# Patient Record
Sex: Male | Born: 1935 | Race: White | Hispanic: No | Smoking: Former smoker
Health system: Southern US, Community
[De-identification: ages and names within clinical notes are randomized; demographics above are authoritative.]

## PROBLEM LIST (undated history)

## (undated) DIAGNOSIS — I499 Cardiac arrhythmia, unspecified: Secondary | ICD-10-CM

## (undated) DIAGNOSIS — H33059 Total retinal detachment, unspecified eye: Secondary | ICD-10-CM

## (undated) DIAGNOSIS — N135 Crossing vessel and stricture of ureter without hydronephrosis: Secondary | ICD-10-CM

## (undated) DIAGNOSIS — U071 COVID-19: Secondary | ICD-10-CM

## (undated) DIAGNOSIS — C801 Malignant (primary) neoplasm, unspecified: Secondary | ICD-10-CM

## (undated) DIAGNOSIS — I4891 Unspecified atrial fibrillation: Secondary | ICD-10-CM

## (undated) DIAGNOSIS — N139 Obstructive and reflux uropathy, unspecified: Secondary | ICD-10-CM

## (undated) DIAGNOSIS — Z932 Ileostomy status: Secondary | ICD-10-CM

## (undated) DIAGNOSIS — G4730 Sleep apnea, unspecified: Secondary | ICD-10-CM

## (undated) DIAGNOSIS — Z86718 Personal history of other venous thrombosis and embolism: Secondary | ICD-10-CM

## (undated) DIAGNOSIS — D649 Anemia, unspecified: Secondary | ICD-10-CM

## (undated) DIAGNOSIS — I639 Cerebral infarction, unspecified: Secondary | ICD-10-CM

## (undated) DIAGNOSIS — R0600 Dyspnea, unspecified: Secondary | ICD-10-CM

## (undated) DIAGNOSIS — N1330 Unspecified hydronephrosis: Secondary | ICD-10-CM

## (undated) DIAGNOSIS — K5190 Ulcerative colitis, unspecified, without complications: Secondary | ICD-10-CM

## (undated) DIAGNOSIS — M1990 Unspecified osteoarthritis, unspecified site: Secondary | ICD-10-CM

## (undated) DIAGNOSIS — N189 Chronic kidney disease, unspecified: Secondary | ICD-10-CM

## (undated) HISTORY — DX: Cerebral infarction, unspecified: I63.9

## (undated) HISTORY — PX: OTHER SURGICAL HISTORY: SHX169

## (undated) HISTORY — DX: Total retinal detachment, unspecified eye: H33.059

## (undated) HISTORY — DX: Anemia, unspecified: D64.9

## (undated) HISTORY — PX: TOTAL COLECTOMY: SHX852

## (undated) HISTORY — DX: Unspecified atrial fibrillation: I48.91

## (undated) HISTORY — PX: SMALL INTESTINE SURGERY: SHX150

## (undated) HISTORY — DX: Chronic kidney disease, unspecified: N18.9

## (undated) HISTORY — PX: COLON SURGERY: SHX602

## (undated) HISTORY — PX: BASAL CELL CARCINOMA EXCISION: SHX1214

## (undated) HISTORY — PX: EYE SURGERY: SHX253

---

## 2021-09-30 ENCOUNTER — Telehealth: Payer: Self-pay

## 2021-09-30 NOTE — Telephone Encounter (Addendum)
Daughter Debroah Baller called wanting Dr Yong Channel to see Joshua Riley sooner- Daughter was advised Dr Ronney Lion sch books out quickly.   Daughter wants [name] to be ref out to a kidney specialist.   Daughter was offered to switch providers should he need to be seen quicker, daughter declined. Only wants Dr Yong Channel.   I see Dr Yong Channel has availability Apil 4th, I will offer hat to the patient.

## 2021-09-30 NOTE — Telephone Encounter (Signed)
Per Tammy : Pt daughter states He is moving up her from [facility].  Currently on dialysis.   I scheduled him for a second appt.  But she says that he has a script and has gone to a dialysis facility here before.  I have told her to go ahead and reach out to line those appts up.  So she may call back and cancel. ?

## 2021-09-30 NOTE — Telephone Encounter (Incomplete Revision)
Daughter Debroah Baller called wanting Dr Yong Channel to see Percell Miller sooner- Daughter was advised Dr Ronney Lion sch books out quickly.  ? ?Daughter wants Kimm to be ref out to a kidney specialist.  ? ?Daughter was offered to switch providers should he need to be seen quicker, daughter declined. Only wants Dr Yong Channel.  ? ?I see Dr Yong Channel has availability Apil 4th, I will offer hat to the patient.  ?

## 2021-10-22 ENCOUNTER — Ambulatory Visit (INDEPENDENT_AMBULATORY_CARE_PROVIDER_SITE_OTHER): Payer: Medicare Other | Admitting: Family Medicine

## 2021-10-22 ENCOUNTER — Other Ambulatory Visit: Payer: Self-pay

## 2021-10-22 ENCOUNTER — Encounter: Payer: Self-pay | Admitting: Family Medicine

## 2021-10-22 VITALS — BP 104/64 | HR 55 | Temp 97.4°F | Ht 68.0 in | Wt 164.4 lb

## 2021-10-22 DIAGNOSIS — I951 Orthostatic hypotension: Secondary | ICD-10-CM | POA: Diagnosis not present

## 2021-10-22 DIAGNOSIS — T829XXA Unspecified complication of cardiac and vascular prosthetic device, implant and graft, initial encounter: Secondary | ICD-10-CM

## 2021-10-22 DIAGNOSIS — H409 Unspecified glaucoma: Secondary | ICD-10-CM | POA: Diagnosis not present

## 2021-10-22 DIAGNOSIS — N186 End stage renal disease: Secondary | ICD-10-CM | POA: Diagnosis not present

## 2021-10-22 DIAGNOSIS — Z9889 Other specified postprocedural states: Secondary | ICD-10-CM

## 2021-10-22 DIAGNOSIS — G609 Hereditary and idiopathic neuropathy, unspecified: Secondary | ICD-10-CM

## 2021-10-22 DIAGNOSIS — Z936 Other artificial openings of urinary tract status: Secondary | ICD-10-CM | POA: Insufficient documentation

## 2021-10-22 DIAGNOSIS — H9193 Unspecified hearing loss, bilateral: Secondary | ICD-10-CM

## 2021-10-22 DIAGNOSIS — Z436 Encounter for attention to other artificial openings of urinary tract: Secondary | ICD-10-CM

## 2021-10-22 DIAGNOSIS — Z933 Colostomy status: Secondary | ICD-10-CM

## 2021-10-22 HISTORY — DX: Hereditary and idiopathic neuropathy, unspecified: G60.9

## 2021-10-22 HISTORY — DX: Orthostatic hypotension: I95.1

## 2021-10-22 HISTORY — DX: End stage renal disease: N18.6

## 2021-10-22 MED ORDER — MIDODRINE HCL 5 MG PO TABS: 5.0000 mg | ORAL_TABLET | Freq: Three times a day (TID) | ORAL | 3 refills | Status: DC

## 2021-10-22 MED ORDER — MIDODRINE HCL 10 MG PO TABS: 10.0000 mg | ORAL_TABLET | Freq: Three times a day (TID) | ORAL | 3 refills | Status: DC

## 2021-10-22 MED ORDER — PREGABALIN 50 MG PO CAPS: 50.0000 mg | ORAL_CAPSULE | Freq: Every day | ORAL | 3 refills | Status: DC

## 2021-10-22 NOTE — Progress Notes (Signed)
?Phone: [phone] ?  ?Subjective:  ?Patient presents today to establish care.  Prior patient in [place] Dr. Jeronimo Norma.  ?Chief Complaint  ?Patient presents with  ? New Patient (Initial Visit)  ?  Recently moved from [facility] ?  ? end stage kidney disease  ?  Has been on dialysis for 1.5 yrs, is not on a transplant list  ?Needs referral to nephrologist - possibly [facility] so that he can stay with [facility]   ? low blood pressure  ?  Usually during and after treatments, but does bother him occasionally on his days off   ? post COVID  ?  COVID around a year ago, still having issues with SOB  ? ?See problem oriented charting ? ?The following were reviewed and entered/updated in epic: ?Past Medical History:  ?Diagnosis Date  ? Anemia   ? Chronic kidney disease   ? Neuromuscular disorder ([place])   ? ?Patient Active Problem List  ? Diagnosis Date Noted  ? ESRD (end stage renal disease) ([place]) 10/22/2021  ?  Priority: High  ? Orthostatic hypotension 10/22/2021  ?  Priority: High  ? ?Past Surgical History:  ?Procedure Laterality Date  ? COLON SURGERY    ? EYE SURGERY    ? SMALL INTESTINE SURGERY    ? ? ?History reviewed. No pertinent family history. ? ?Medications- reviewed and updated ?Current Outpatient Medications  ?Medication Sig Dispense Refill  ? latanoprost (XALATAN) 0.005 % ophthalmic solution 1 drop at bedtime.    ? sevelamer carbonate (RENVELA) 800 MG tablet Take 1,600 mg by mouth 3 (three) times daily with meals.    ? sodium bicarbonate 650 MG tablet Take 650 mg by mouth 2 (two) times daily.    ? timolol (TIMOPTIC) 0.5 % ophthalmic solution 1 drop 2 (two) times daily.    ? midodrine (PROAMATINE) 10 MG tablet Take 1 tablet (10 mg total) by mouth 3 (three) times daily. Takes 5 mg plus 89m for 15 mg total three times a day- started by nephrology in [place] 270 tablet 3  ? midodrine (PROAMATINE) 5 MG tablet Take 1 tablet (5 mg total) by mouth 3 (three) times daily with meals. Takes 5 mg plus 174mfor 15  mg total three times a day- [place]  ? pregabalin (LYRICA) 50 MG capsule Take 1 capsule (50 mg total) by mouth daily. 90 capsule 3  ? ?No current facility-administered medications for this visit.  ? ? ?Allergies-reviewed and updated ?Not on File ? ?Social History  ? ?Social History Narrative  ? Widowed- lost wife of 13 years to pancreatic cancer- previously married 4344ears. [place].   ? -will be living alone  ?   ? 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   ?   ? Hobbies: dinner with family- occasional martini  ? ? ?Objective  ?Objective:  ?BP 104/64   Pulse (!) 55   Temp (!) 97.4 ?F (36.3 ?C) (Temporal)   Ht 5' 8"  (1.727 m)   Wt 164 lb 6.4 oz (74.6 kg)   SpO2 97%   BMI 25.00 kg/m?  ?Gen: NAD, resting comfortably ?CV: RRR no murmurs rubs or gallops ?Lungs: CTAB no crackles, wheeze, rhonchi ?Abdomen: soft/nontender/nondistended/normal bowel sounds. No rebound or guarding. Colostomy bag noted. Urostomy tube noted ?Ext: no edema ?Skin: warm, dry ? ?  ?Assessment and Plan:  ? ?#ESRD ?#Hypotension ?S: Patient  is on dialysis MWF   ?Medication: Renvela 800 mg-takes 2 tablets 3 times a day as well as sodium bicarbonate 650 mg twice daily ?-Reports starting dialysis around 2021.  Not on transplant list. ?-Does have a tendency towards low blood pressures with dialysis days-takes midodrine 15 mg 3 times a day but still has issues with orthostatsis ?-Left femoral AV graft created 09/25/2021 (temporary on the right) with [facility] in [place] after femoral dialysis catheter stopped working December 2022 ?A/P: ESRD has been stable with MWF dialysis- urgent referral to get him seen locally with nephrology. Continue renvela and sodium bicarb through neprhology ?-also urgent referral to vascular surgery- recent AV graft in left femoral that he was told needed follow up- not  quite ready for use ?-For orthostatic hypotension I will refill his midodrine at 15 mg 3 times a day-started by nephrology but I will continue this-we may need input from nephrology if continues to have issues or if symptoms worsen ? ?# Idiopathic Neuropathy- started after covid though ?S: Medication: Lyrica 50 mg started in mid 2022. Mildly helpful. Also had seen chiropractor- mildly helpful.  ?- feet and legs and hands ?A/P: overall stable- continue current medicines . I refilled lyrida ? ?# history of ulcerative colitis led to complete colectomy in 2012- has colostomy bag (failed J patch) ?-also has urostomy as complication of colectomy which led to quicker dialysis per his report (sounds like had CKD IV prior to this). Still urinates from penis as well.   ? ?#Shortness of breath ?S: started after covid December 2021. Gets cramps when a lot of fluid pulled off but short of breath if pulls off less.  ?A/P: sounds like these issues are related to fluid status- will refer to nephrology for their expert opinion.   ?  ?#glaucoma- follows with optho- needs to establish locally- refer today ? ?#hearing loss- even with aids has issue-s requests referral ? ?#frustrations with chronic medial illness- offered therapy but he declines. Id worry about orthostatic symptoms with SSRI- he does not want to make changes at this time.  ? ?Recommended follow up: Return in about 3 months (around 01/21/2022). ?Future Appointments  ?Date Time Provider [place]  ?11/15/2021  3:20 PM Abbagayle Zaragoza, Brayton Mars, MD [facility]  ? ?Meds ordered this encounter  ?Medications  ? midodrine (PROAMATINE) 10 MG tablet  ?  Sig: Take 1 tablet (10 mg total) by mouth 3 (three) times daily. Takes 5 mg plus 70m for 15 mg total three times a day- [place] ?  Dispense:  270 tablet  ?  Refill:  3  ? midodrine (PROAMATINE) 5 MG tablet  ?  Sig: Take 1 tablet (5 mg total) by mouth 3 (three) times daily with meals. Takes 5 mg plus 175m for 15 mg total three times a day- [place]?  Dispense:  270 tablet  ?  Refill:  3  ? pregabalin (LYRICA) 50 MG capsule  ?  Sig: Take 1 capsule (50 mg total) by mouth daily.  ?  Dispense:  90 capsule  ?  Refill:  3  ? ?Return precautions advised. ?StGarret ReddishMD ?

## 2021-10-22 NOTE — Patient Instructions (Addendum)
Sign release of information at the check out desk for all immunizations as well as 2-3 years of labs, office visits from primary care. We are looking for pneumonia shot, tetanus shot, shingles shot and covid immunizations ?-also sign release for last 2 years from kidney doctor/nephrology ? ?We will call you within two weeks about your referral to kidney doctor and vascular surgeon and eye doctor and audiology and urology. If you do not hear within 2 weeks, give [place] a call.   ? ?Recommended follow up: Return in about 3 months (around 01/21/2022) for followup or sooner if needed.Schedule b4 you leave. ?-can cancel visit for may unless you have other concerns that come up ?

## 2021-11-15 ENCOUNTER — Ambulatory Visit: Payer: Self-pay | Admitting: Family Medicine

## 2021-11-17 ENCOUNTER — Emergency Department (HOSPITAL_BASED_OUTPATIENT_CLINIC_OR_DEPARTMENT_OTHER): Payer: Medicare Other

## 2021-11-17 ENCOUNTER — Encounter (HOSPITAL_BASED_OUTPATIENT_CLINIC_OR_DEPARTMENT_OTHER): Payer: Self-pay | Admitting: Emergency Medicine

## 2021-11-17 ENCOUNTER — Other Ambulatory Visit: Payer: Self-pay

## 2021-11-17 ENCOUNTER — Inpatient Hospital Stay (HOSPITAL_BASED_OUTPATIENT_CLINIC_OR_DEPARTMENT_OTHER)
Admission: EM | Admit: 2021-11-17 | Discharge: 2021-11-19 | DRG: 393 | Disposition: A | Discharge status: Home / Self Care | Payer: Medicare Other | Attending: Internal Medicine | Admitting: Internal Medicine

## 2021-11-17 DIAGNOSIS — Z932 Ileostomy status: Secondary | ICD-10-CM

## 2021-11-17 DIAGNOSIS — G609 Hereditary and idiopathic neuropathy, unspecified: Secondary | ICD-10-CM | POA: Diagnosis present

## 2021-11-17 DIAGNOSIS — Z602 Problems related to living alone: Secondary | ICD-10-CM | POA: Diagnosis present

## 2021-11-17 DIAGNOSIS — Z881 Allergy status to other antibiotic agents status: Secondary | ICD-10-CM

## 2021-11-17 DIAGNOSIS — K660 Peritoneal adhesions (postprocedural) (postinfection): Secondary | ICD-10-CM | POA: Diagnosis not present

## 2021-11-17 DIAGNOSIS — E875 Hyperkalemia: Secondary | ICD-10-CM | POA: Diagnosis not present

## 2021-11-17 DIAGNOSIS — H9193 Unspecified hearing loss, bilateral: Secondary | ICD-10-CM | POA: Diagnosis present

## 2021-11-17 DIAGNOSIS — I4819 Other persistent atrial fibrillation: Secondary | ICD-10-CM | POA: Diagnosis present

## 2021-11-17 DIAGNOSIS — Z974 Presence of external hearing-aid: Secondary | ICD-10-CM

## 2021-11-17 DIAGNOSIS — Z87891 Personal history of nicotine dependence: Secondary | ICD-10-CM

## 2021-11-17 DIAGNOSIS — I4891 Unspecified atrial fibrillation: Secondary | ICD-10-CM | POA: Diagnosis not present

## 2021-11-17 DIAGNOSIS — N1330 Unspecified hydronephrosis: Secondary | ICD-10-CM | POA: Diagnosis present

## 2021-11-17 DIAGNOSIS — Z933 Colostomy status: Secondary | ICD-10-CM

## 2021-11-17 DIAGNOSIS — I440 Atrioventricular block, first degree: Secondary | ICD-10-CM | POA: Diagnosis present

## 2021-11-17 DIAGNOSIS — N186 End stage renal disease: Secondary | ICD-10-CM | POA: Diagnosis present

## 2021-11-17 DIAGNOSIS — D631 Anemia in chronic kidney disease: Secondary | ICD-10-CM | POA: Diagnosis present

## 2021-11-17 DIAGNOSIS — M898X9 Other specified disorders of bone, unspecified site: Secondary | ICD-10-CM | POA: Diagnosis present

## 2021-11-17 DIAGNOSIS — Z992 Dependence on renal dialysis: Secondary | ICD-10-CM

## 2021-11-17 DIAGNOSIS — Z86718 Personal history of other venous thrombosis and embolism: Secondary | ICD-10-CM

## 2021-11-17 DIAGNOSIS — Z79899 Other long term (current) drug therapy: Secondary | ICD-10-CM

## 2021-11-17 DIAGNOSIS — H409 Unspecified glaucoma: Secondary | ICD-10-CM | POA: Diagnosis present

## 2021-11-17 DIAGNOSIS — I951 Orthostatic hypotension: Secondary | ICD-10-CM | POA: Diagnosis present

## 2021-11-17 DIAGNOSIS — Z9049 Acquired absence of other specified parts of digestive tract: Secondary | ICD-10-CM

## 2021-11-17 DIAGNOSIS — K56609 Unspecified intestinal obstruction, unspecified as to partial versus complete obstruction: Secondary | ICD-10-CM | POA: Diagnosis present

## 2021-11-17 DIAGNOSIS — I480 Paroxysmal atrial fibrillation: Secondary | ICD-10-CM | POA: Diagnosis present

## 2021-11-17 DIAGNOSIS — I4510 Unspecified right bundle-branch block: Secondary | ICD-10-CM | POA: Diagnosis present

## 2021-11-17 HISTORY — DX: Personal history of other venous thrombosis and embolism: Z86.718

## 2021-11-17 HISTORY — DX: Unspecified hydronephrosis: N13.30

## 2021-11-17 HISTORY — DX: Ulcerative colitis, unspecified, without complications: K51.90

## 2021-11-17 HISTORY — DX: Crossing vessel and stricture of ureter without hydronephrosis: N13.5

## 2021-11-17 LAB — COMPREHENSIVE METABOLIC PANEL
ALT: 31 U/L (ref 0–44)
AST: 29 U/L (ref 15–41)
Albumin: 5.2 g/dL — ABNORMAL HIGH (ref 3.5–5.0)
Alkaline Phosphatase: 86 U/L (ref 38–126)
Anion gap: 22 — ABNORMAL HIGH (ref 5–15)
BUN: 67 mg/dL — ABNORMAL HIGH (ref 8–23)
CO2: 30 mmol/L (ref 22–32)
Calcium: 11 mg/dL — ABNORMAL HIGH (ref 8.9–10.3)
Chloride: 90 mmol/L — ABNORMAL LOW (ref 98–111)
Creatinine, Ser: 11.05 mg/dL — ABNORMAL HIGH (ref 0.61–1.24)
GFR, Estimated: 4 mL/min — ABNORMAL LOW (ref >60)
Glucose, Bld: 160 mg/dL — ABNORMAL HIGH (ref 70–99)
Potassium: 6 mmol/L — ABNORMAL HIGH (ref 3.5–5.1)
Sodium: 142 mmol/L (ref 135–145)
Total Bilirubin: 0.8 mg/dL (ref 0.3–1.2)
Total Protein: 9.1 g/dL — ABNORMAL HIGH (ref 6.5–8.1)

## 2021-11-17 LAB — CBC
HCT: 32.9 % — ABNORMAL LOW (ref 39.0–52.0)
Hemoglobin: 11 g/dL — ABNORMAL LOW (ref 13.0–17.0)
MCH: 40.3 pg — ABNORMAL HIGH (ref 26.0–34.0)
MCHC: 33.4 g/dL (ref 30.0–36.0)
MCV: 120.5 fL — ABNORMAL HIGH (ref 80.0–100.0)
Platelets: 175 10*3/uL (ref 150–400)
RBC: 2.73 MIL/uL — ABNORMAL LOW (ref 4.22–5.81)
RDW: 14.9 % (ref 11.5–15.5)
WBC: 15.2 10*3/uL — ABNORMAL HIGH (ref 4.0–10.5)
nRBC: 0 % (ref 0.0–0.2)

## 2021-11-17 LAB — LIPASE, BLOOD: Lipase: 70 U/L — ABNORMAL HIGH (ref 11–51)

## 2021-11-17 LAB — LACTIC ACID, PLASMA: Lactic Acid, Venous: 2.8 mmol/L (ref 0.5–1.9)

## 2021-11-17 MED ORDER — ONDANSETRON HCL 4 MG/2ML IJ SOLN
4.0000 mg | Freq: Once | INTRAMUSCULAR | Status: AC
  Administered 2021-11-17: 4 mg via INTRAVENOUS
  Filled 2021-11-17: qty 2

## 2021-11-17 MED ORDER — SODIUM CHLORIDE 0.9 % IV BOLUS
250.0000 mL | Freq: Once | INTRAVENOUS | Status: AC
  Administered 2021-11-17: 250 mL via INTRAVENOUS

## 2021-11-17 MED ORDER — ONDANSETRON HCL 4 MG/2ML IJ SOLN
4.0000 mg | Freq: Once | INTRAMUSCULAR | Status: DC | PRN
  Filled 2021-11-17: qty 2

## 2021-11-17 MED ORDER — MORPHINE SULFATE (PF) 4 MG/ML IV SOLN
4.0000 mg | Freq: Once | INTRAVENOUS | Status: AC
  Administered 2021-11-17: 4 mg via INTRAVENOUS
  Filled 2021-11-17: qty 1

## 2021-11-17 NOTE — ED Notes (Signed)
Pt vomiting during triage. Significant about of brown vomit.  ?

## 2021-11-17 NOTE — ED Notes (Signed)
Oxygen drop to 85-86% on RA after receiving morphine // pt placed on 3L [place] and O2 100% ?

## 2021-11-17 NOTE — ED Notes (Addendum)
Cassandria Anger MD made aware of lactic 2.8 ?

## 2021-11-17 NOTE — ED Notes (Signed)
Dialysis M/W/F ?

## 2021-11-17 NOTE — ED Triage Notes (Signed)
Pt has colostomy and hasn't had to empty bag since approx 630 this morning. Pt states he had a large meal last night. Pt states he has had a blockage at stoma a few years ago and this feels the same.  ?

## 2021-11-18 ENCOUNTER — Other Ambulatory Visit: Payer: Self-pay | Admitting: Family Medicine

## 2021-11-18 ENCOUNTER — Emergency Department (HOSPITAL_BASED_OUTPATIENT_CLINIC_OR_DEPARTMENT_OTHER): Payer: Medicare Other

## 2021-11-18 ENCOUNTER — Encounter (HOSPITAL_COMMUNITY): Payer: Self-pay | Admitting: Internal Medicine

## 2021-11-18 DIAGNOSIS — Z9049 Acquired absence of other specified parts of digestive tract: Secondary | ICD-10-CM | POA: Diagnosis not present

## 2021-11-18 DIAGNOSIS — H409 Unspecified glaucoma: Secondary | ICD-10-CM

## 2021-11-18 DIAGNOSIS — Z992 Dependence on renal dialysis: Secondary | ICD-10-CM | POA: Diagnosis not present

## 2021-11-18 DIAGNOSIS — N186 End stage renal disease: Secondary | ICD-10-CM | POA: Diagnosis present

## 2021-11-18 DIAGNOSIS — I4819 Other persistent atrial fibrillation: Secondary | ICD-10-CM | POA: Diagnosis present

## 2021-11-18 DIAGNOSIS — Z881 Allergy status to other antibiotic agents status: Secondary | ICD-10-CM | POA: Diagnosis not present

## 2021-11-18 DIAGNOSIS — M898X9 Other specified disorders of bone, unspecified site: Secondary | ICD-10-CM | POA: Diagnosis present

## 2021-11-18 DIAGNOSIS — I951 Orthostatic hypotension: Secondary | ICD-10-CM | POA: Diagnosis present

## 2021-11-18 DIAGNOSIS — I4510 Unspecified right bundle-branch block: Secondary | ICD-10-CM | POA: Diagnosis present

## 2021-11-18 DIAGNOSIS — K56609 Unspecified intestinal obstruction, unspecified as to partial versus complete obstruction: Secondary | ICD-10-CM | POA: Diagnosis present

## 2021-11-18 DIAGNOSIS — D631 Anemia in chronic kidney disease: Secondary | ICD-10-CM | POA: Diagnosis present

## 2021-11-18 DIAGNOSIS — Z86718 Personal history of other venous thrombosis and embolism: Secondary | ICD-10-CM | POA: Diagnosis not present

## 2021-11-18 DIAGNOSIS — E875 Hyperkalemia: Secondary | ICD-10-CM | POA: Diagnosis present

## 2021-11-18 DIAGNOSIS — G609 Hereditary and idiopathic neuropathy, unspecified: Secondary | ICD-10-CM | POA: Diagnosis present

## 2021-11-18 DIAGNOSIS — I4891 Unspecified atrial fibrillation: Secondary | ICD-10-CM | POA: Diagnosis present

## 2021-11-18 DIAGNOSIS — I440 Atrioventricular block, first degree: Secondary | ICD-10-CM | POA: Diagnosis present

## 2021-11-18 DIAGNOSIS — K660 Peritoneal adhesions (postprocedural) (postinfection): Secondary | ICD-10-CM | POA: Diagnosis present

## 2021-11-18 DIAGNOSIS — Z602 Problems related to living alone: Secondary | ICD-10-CM | POA: Diagnosis present

## 2021-11-18 DIAGNOSIS — Z932 Ileostomy status: Secondary | ICD-10-CM | POA: Diagnosis not present

## 2021-11-18 DIAGNOSIS — Z79899 Other long term (current) drug therapy: Secondary | ICD-10-CM | POA: Diagnosis not present

## 2021-11-18 DIAGNOSIS — H9193 Unspecified hearing loss, bilateral: Secondary | ICD-10-CM | POA: Diagnosis present

## 2021-11-18 DIAGNOSIS — N1330 Unspecified hydronephrosis: Secondary | ICD-10-CM | POA: Diagnosis present

## 2021-11-18 DIAGNOSIS — Z87891 Personal history of nicotine dependence: Secondary | ICD-10-CM | POA: Diagnosis not present

## 2021-11-18 DIAGNOSIS — I480 Paroxysmal atrial fibrillation: Secondary | ICD-10-CM | POA: Diagnosis present

## 2021-11-18 DIAGNOSIS — G250 Essential tremor: Secondary | ICD-10-CM

## 2021-11-18 DIAGNOSIS — Z974 Presence of external hearing-aid: Secondary | ICD-10-CM | POA: Diagnosis not present

## 2021-11-18 HISTORY — DX: Unspecified glaucoma: H40.9

## 2021-11-18 LAB — BASIC METABOLIC PANEL
Anion gap: 21 — ABNORMAL HIGH (ref 5–15)
Anion gap: 17 — ABNORMAL HIGH (ref 5–15)
BUN: 69 mg/dL — ABNORMAL HIGH (ref 8–23)
BUN: 44 mg/dL — ABNORMAL HIGH (ref 8–23)
CO2: 30 mmol/L (ref 22–32)
CO2: 30 mmol/L (ref 22–32)
Calcium: 10 mg/dL (ref 8.9–10.3)
Calcium: 9.1 mg/dL (ref 8.9–10.3)
Chloride: 90 mmol/L — ABNORMAL LOW (ref 98–111)
Chloride: 92 mmol/L — ABNORMAL LOW (ref 98–111)
Creatinine, Ser: 11.29 mg/dL — ABNORMAL HIGH (ref 0.61–1.24)
Creatinine, Ser: 8.65 mg/dL — ABNORMAL HIGH (ref 0.61–1.24)
GFR, Estimated: 4 mL/min — ABNORMAL LOW (ref >60)
GFR, Estimated: 6 mL/min — ABNORMAL LOW (ref >60)
Glucose, Bld: 119 mg/dL — ABNORMAL HIGH (ref 70–99)
Glucose, Bld: 121 mg/dL — ABNORMAL HIGH (ref 70–99)
Potassium: 6.7 mmol/L (ref 3.5–5.1)
Potassium: 6 mmol/L — ABNORMAL HIGH (ref 3.5–5.1)
Sodium: 141 mmol/L (ref 135–145)
Sodium: 139 mmol/L (ref 135–145)

## 2021-11-18 LAB — LACTIC ACID, PLASMA
Lactic Acid, Venous: 2.3 mmol/L (ref 0.5–1.9)
Lactic Acid, Venous: 2.7 mmol/L (ref 0.5–1.9)

## 2021-11-18 LAB — CBG MONITORING, ED
Glucose-Capillary: 114 mg/dL — ABNORMAL HIGH (ref 70–99)
Glucose-Capillary: 177 mg/dL — ABNORMAL HIGH (ref 70–99)

## 2021-11-18 LAB — HEPATITIS B SURFACE ANTIBODY,QUALITATIVE: Hep B S Ab: NONREACTIVE

## 2021-11-18 LAB — HEPATITIS B SURFACE ANTIGEN: Hepatitis B Surface Ag: NONREACTIVE

## 2021-11-18 MED ORDER — SODIUM BICARBONATE 650 MG PO TABS
650.0000 mg | ORAL_TABLET | Freq: Two times a day (BID) | ORAL | Status: DC
  Administered 2021-11-18 – 2021-11-19 (×3): 650 mg
  Filled 2021-11-18 (×3): qty 1

## 2021-11-18 MED ORDER — ALBUMIN HUMAN 25 % IV SOLN
INTRAVENOUS | Status: AC
Start: 2021-11-18 — End: 2021-11-18
  Filled 2021-11-18: qty 100

## 2021-11-18 MED ORDER — ALBUMIN HUMAN 25 % IV SOLN
12.5000 g | Freq: Once | INTRAVENOUS | Status: AC
  Administered 2021-11-18: 12.5 g via INTRAVENOUS

## 2021-11-18 MED ORDER — HEPARIN SODIUM (PORCINE) 1000 UNIT/ML DIALYSIS
1500.0000 [IU] | INTRAMUSCULAR | Status: DC | PRN
  Filled 2021-11-18: qty 2

## 2021-11-18 MED ORDER — ONDANSETRON HCL 4 MG/2ML IJ SOLN: 4.0000 mg | Freq: Four times a day (QID) | INTRAMUSCULAR | Status: DC | PRN

## 2021-11-18 MED ORDER — ONDANSETRON 4 MG PO TBDP: 4.0000 mg | ORAL_TABLET | Freq: Four times a day (QID) | ORAL | Status: DC | PRN

## 2021-11-18 MED ORDER — CHLORHEXIDINE GLUCONATE CLOTH 2 % EX PADS
6.0000 | MEDICATED_PAD | Freq: Every day | CUTANEOUS | Status: DC
  Administered 2021-11-19: 6 via TOPICAL

## 2021-11-18 MED ORDER — SODIUM BICARBONATE 8.4 % IV SOLN
50.0000 meq | Freq: Once | INTRAVENOUS | Status: AC
Start: 2021-11-18 — End: 2021-11-18
  Administered 2021-11-18: 50 meq via INTRAVENOUS
  Filled 2021-11-18: qty 50

## 2021-11-18 MED ORDER — DIATRIZOATE MEGLUMINE & SODIUM 66-10 % PO SOLN
90.0000 mL | Freq: Once | ORAL | Status: AC
  Administered 2021-11-18: 90 mL via NASOGASTRIC
  Filled 2021-11-18: qty 90

## 2021-11-18 MED ORDER — FENTANYL CITRATE PF 50 MCG/ML IJ SOSY: 25.0000 ug | PREFILLED_SYRINGE | INTRAMUSCULAR | Status: DC | PRN

## 2021-11-18 MED ORDER — ACETAMINOPHEN 650 MG RE SUPP: 650.0000 mg | Freq: Four times a day (QID) | RECTAL | Status: DC | PRN

## 2021-11-18 MED ORDER — ACETAMINOPHEN 325 MG PO TABS
650.0000 mg | ORAL_TABLET | Freq: Four times a day (QID) | ORAL | Status: DC | PRN
  Administered 2021-11-18: 650 mg
  Filled 2021-11-18: qty 2

## 2021-11-18 MED ORDER — PREGABALIN 25 MG PO CAPS
50.0000 mg | ORAL_CAPSULE | Freq: Every day | ORAL | Status: DC
  Administered 2021-11-18: 50 mg
  Filled 2021-11-18: qty 2

## 2021-11-18 MED ORDER — LATANOPROST 0.005 % OP SOLN
1.0000 [drp] | Freq: Every day | OPHTHALMIC | Status: DC
  Administered 2021-11-18: 1 [drp] via OPHTHALMIC
  Filled 2021-11-18: qty 2.5

## 2021-11-18 MED ORDER — MIDODRINE HCL 5 MG PO TABS: 5.0000 mg | ORAL_TABLET | Freq: Three times a day (TID) | ORAL | Status: DC

## 2021-11-18 MED ORDER — HYDROXYZINE HCL 25 MG PO TABS: 25.0000 mg | ORAL_TABLET | Freq: Three times a day (TID) | ORAL | Status: DC | PRN

## 2021-11-18 MED ORDER — HEPARIN SODIUM (PORCINE) 1000 UNIT/ML IJ SOLN
5100.0000 [IU] | Freq: Once | INTRAMUSCULAR | Status: AC
  Administered 2021-11-18: 5100 [IU] via INTRAVENOUS
  Filled 2021-11-18: qty 5.1

## 2021-11-18 MED ORDER — TIMOLOL MALEATE 0.5 % OP SOLN
1.0000 [drp] | Freq: Two times a day (BID) | OPHTHALMIC | Status: DC
  Administered 2021-11-18: 1 [drp] via OPHTHALMIC
  Filled 2021-11-18 (×2): qty 5

## 2021-11-18 MED ORDER — DEXTROSE 50 % IV SOLN
1.0000 | Freq: Once | INTRAVENOUS | Status: AC
  Administered 2021-11-18: 50 mL via INTRAVENOUS
  Filled 2021-11-18: qty 50

## 2021-11-18 MED ORDER — INSULIN ASPART 100 UNIT/ML IV SOLN
5.0000 [IU] | Freq: Once | INTRAVENOUS | Status: AC
  Administered 2021-11-18: 5 [IU] via INTRAVENOUS

## 2021-11-18 MED ORDER — HYDRALAZINE HCL 20 MG/ML IJ SOLN: 10.0000 mg | INTRAMUSCULAR | Status: DC | PRN

## 2021-11-18 MED ORDER — MIDODRINE HCL 5 MG PO TABS
15.0000 mg | ORAL_TABLET | Freq: Three times a day (TID) | ORAL | Status: DC
  Administered 2021-11-18 – 2021-11-19 (×3): 15 mg
  Filled 2021-11-18 (×3): qty 3

## 2021-11-18 MED ORDER — ONDANSETRON HCL 4 MG PO TABS: 4.0000 mg | ORAL_TABLET | Freq: Four times a day (QID) | ORAL | Status: DC | PRN

## 2021-11-18 MED ORDER — ZOLPIDEM TARTRATE 5 MG PO TABS: 5.0000 mg | ORAL_TABLET | Freq: Every evening | ORAL | Status: DC | PRN

## 2021-11-18 MED ORDER — CAMPHOR-MENTHOL 0.5-0.5 % EX LOTN: 1.0000 "application " | TOPICAL_LOTION | Freq: Three times a day (TID) | CUTANEOUS | Status: DC | PRN

## 2021-11-18 MED ORDER — SEVELAMER CARBONATE 800 MG PO TABS: 1600.0000 mg | ORAL_TABLET | Freq: Three times a day (TID) | ORAL | Status: DC

## 2021-11-18 MED ORDER — MIDODRINE HCL 5 MG PO TABS: 15.0000 mg | ORAL_TABLET | Freq: Three times a day (TID) | ORAL | Status: DC

## 2021-11-18 NOTE — ED Notes (Signed)
Report given given to [facility]  ?

## 2021-11-18 NOTE — Procedures (Signed)
? ?  I was present at this dialysis session, have reviewed the session itself and made  appropriate changes ?Kelly Splinter MD ?[employer] ?pager [phone]   ?11/18/2021, 10:06 AM ? ? ?

## 2021-11-18 NOTE — Progress Notes (Signed)
[facility] informed of Lactic acid 2.7 ?

## 2021-11-18 NOTE — H&P (Signed)
?History and Physical  ? ? ?Patient: Joshua Riley CXK:[id number] DOB: 02-Jan-1936 ?DOA: 11/17/2021 ?DOS: the patient was seen and examined on 11/18/2021 ?PCP: Marin Olp, MD  ?Patient coming from: Home - lives with alone; NOK: Daughter, Pat Patrick, [phone] ? ? ?Chief Complaint: Abdominal pain ? ?HPI: Joshua Riley is a 86 y.o. male with medical history significant of ESRD on HD; s/p urostomy/colostomy; and neuropathy presenting with abdominal pain.  He reports prior h/o colostomy - "they thought it was going to be cancer".  He developed acute onset of abdominal pain at 0600 yesterday.  +nausea/vomiting.  No prior h/o SBO.  He does have h/o ESRD and has been on HD for 1-2 years.  He was in HD at the time of my evaluation. ? ? ? ?ER Course:  [facility] transfer, per Dr. Bridgett Larsson: ? ?86 year old from [place] with ESRD on HD MWF, history of colectomy.  Admission for high-grade SBO secondary to potential internal hernia seen on CT.  WBC 15.2, lactate 2.0, potassium 6.0 without EKG changes.  NG tube placed and patient was given a 250 cc fluid bolus.  General surgery Dr. Grandville Silos consulted.  ED physician will also consult nephrology regarding dialysis and hyperkalemia.  ? ?Update: Repeat potassium 6.7 and patient was given insulin/glucose and bicarb.  Needs dialysis and ED physician messaged Dr. Moshe Cipro. ? ? ? ? ?Review of Systems: As mentioned in the history of present illness. All other systems reviewed and are negative. ?Past Medical History:  ?Diagnosis Date  ? Anemia   ? Chronic kidney disease   ? ESRD (end stage renal disease) ([place]) 10/22/2021  ? Glaucoma 11/18/2021  ? History of DVT (deep vein thrombosis)   ? Hydronephrosis   ? managed wtih a PCN  ? Idiopathic neuropathy 10/22/2021  ? lyrica   ? Orthostatic hypotension 10/22/2021  ? Ulcerative colitis ([place])   ? Ureteral stricture   ? secondary to injury during surgery  ? ?Past Surgical History:  ?Procedure Laterality Date  ? COLON SURGERY    ?  creation of j pouch    ? and subsequent takedown of j pouch  ? EYE SURGERY    ? SMALL INTESTINE SURGERY    ? TOTAL COLECTOMY    ? ?Social History:  reports that he has quit smoking. His smoking use included cigarettes. He has never used smokeless tobacco. He reports current alcohol use of about 5.0 standard drinks per week. He reports that he does not use drugs. ? ?Allergies  ?Allergen Reactions  ? Cephalosporins Rash  ? Ciprofloxacin Itching and Rash  ? Baclofen Other (See Comments)  ?  Altered mental status, after accidental overdose ?Altered mental status, after accidental overdose ?  ? ? ?No family history on file. ? ?Prior to Admission medications   ?Medication Sig Start Date End Date Taking? Authorizing Provider  ?latanoprost (XALATAN) 0.005 % ophthalmic solution 1 drop at bedtime.    [provider]  ?midodrine (PROAMATINE) 10 MG tablet Take 1 tablet (10 mg total) by mouth 3 (three) times daily. Takes 5 mg plus 56m for 15 mg total three times a day- [facility]   HMarin Olp MD  ?midodrine (PROAMATINE) 5 MG tablet Take 1 tablet (5 mg total) by mouth 3 (three) times daily with meals. Takes 5 mg plus 186mfor 15 mg total three times a day- [facility]   HuMarin OlpMD  ?pregabalin (LYRICA) 50 MG capsule Take  1 capsule (50 mg total) by mouth daily. 10/22/21   Marin Olp, MD  ?sevelamer carbonate (RENVELA) 800 MG tablet Take 1,600 mg by mouth 3 (three) times daily with meals.    [provider]  ?sodium bicarbonate 650 MG tablet Take 650 mg by mouth 2 (two) times daily.    [provider]  ?timolol (TIMOPTIC) 0.5 % ophthalmic solution 1 drop 2 (two) times daily.    [provider]  ? ? ?Physical Exam: ?Vitals:  ? 11/18/21 1536 11/18/21 1617 11/18/21 1639 11/18/21 1811  ?BP: (!) 80/45 (!) 70/48 (!) 79/54 (!) 80/49  ?Pulse: 91 74 74 69  ?Resp:  18  20  ?Temp:  98.4 ?F (36.9 ?C)  98.2 ?F (36.8 ?C)   ?TempSrc:  Oral  Oral  ?SpO2:  92%  92%  ?Weight:      ?Height:      ? ?General:  Appears calm and comfortable and is in NAD; NG tube in  place and draining 400 cc bilious fluid ?Eyes:   EOMI, normal lids, iris ?ENT:  hard of hearing with hearing aids in place, normal lips & tongue, mmm; NG tube in place ?Neck:  no LAD, masses or thyromegaly ?Cardiovascular:  RRR, no m/r/g. No LE edema.  Femoral HD cath in R groin ?Respiratory:   CTA bilaterally with no wheezes/rales/rhonchi.  Normal respiratory effort. ?Abdomen:  soft, NT, ND, hypoactive BS diffusely, ostomy in RLQ ?Skin:  no rash or induration seen on limited exam ?Musculoskeletal:  grossly normal tone BUE/BLE, good ROM, no bony abnormality ?Psychiatric:  grossly normal mood and affect, speech fluent and appropriate, AOx3 ?Neurologic:  CN 2-12 grossly intact, moves all extremities in coordinated fashion ? ? ?Radiological Exams on Admission: ?Independently reviewed - see discussion in A/P where applicable ? ?CT ABDOMEN PELVIS WO CONTRAST ? ?Result Date: 11/18/2021 ?CLINICAL DATA:  Nausea and vomiting with abdominal pain, initial encounter EXAM: CT ABDOMEN AND PELVIS WITHOUT CONTRAST TECHNIQUE: Multidetector CT imaging of the abdomen and pelvis was performed following the standard protocol without IV contrast. RADIATION DOSE REDUCTION: This exam was performed according to the departmental dose-optimization program which includes automated exposure control, adjustment of the mA and/or kV according to patient size and/or use of iterative reconstruction technique. COMPARISON:  None Available. FINDINGS: Lower chest: Mild atelectatic changes are noted in the left base. Hepatobiliary: Liver is within normal limits. Gallbladder is well distended with a single dependent gallstone. No biliary ductal dilatation is seen. Pancreas: Unremarkable. No pancreatic ductal dilatation or surrounding inflammatory changes. Spleen: Normal in size without focal abnormality.  Adrenals/Urinary Tract: Adrenal glands are within normal limits. Kidneys are somewhat atrophied with evidence of left-sided percutaneous nephrostomy catheter. Scattered small cysts are noted bilaterally measuring less than 1 cm. No follow-up is recommended. No renal calculi or obstructive changes are seen. Bladder is decompressed. Stomach/Bowel: The colon has been surgically removed. A right-sided ileostomy is noted. The ileum just proximal to ileostomy appears within normal limits although in the mid abdomen and anterior pelvis there are multiple dilated loops of jejunum with significant fecalization of small bowel contents in the distal jejunum. There are changes suggestive of internal hernia causing the obstructive change best noted on the coronal image number 63 of series 5. Herniation of a small bowel loop into the ostomy defect just below the ostomy loop is noted. The distal ileum appears within normal limits. Vascular/Lymphatic: Aortic atherosclerosis. No enlarged abdominal or pelvic lymph nodes. Right femoral dialysis catheter is seen. This extends  to the level of the renal veins in the IVC. Dialysis loop is noted in the left thigh which may be occluded. Correlate with clinical findings. Reproductive: Prostate is within normal limits. A penile prosthesis reservoir is noted in the right lower quadrant. Other: No abdominal wall hernia or abnormality. No abdominopelvic ascites. Musculoskeletal: Degenerative changes of the lumbar spine are noted. IMPRESSION: Changes consistent with high-grade small bowel obstruction in the distal jejunum with diffuse fecalization of small bowel contents and jejunal dilation. Findings suggestive of internal hernia noted although these changes may be related to significant adhesions. The ileum and ileostomy appear within normal limits although herniation of a loop of ileum adjacent to the ostomy is noted without complicating factors. Surgical consultation is recommended. Left  nephrostomy catheter in satisfactory position. Cholelithiasis without complicating factors. Prior colectomy. Changes consistent with the known history of end-stage renal disease. Electronically Signed   By: Linus Mako.D.

## 2021-11-18 NOTE — Consult Note (Signed)
? ? ? ?Joshua Riley ?1935-07-17  ?[id number].   ? ?Requesting MD: Dr. Karmen Bongo ?Chief Complaint/Reason for Consult: SBO ? ?HPI:  ?This is an 86 yo male who is a poor historian with multiple medical problems including, ESRD on HD (MWF), hypotension, H/O UC s/p multiple abdominal operations resulting in TAC and ileostomy with ureteral injuries and stenoses resulting in the need for B PCNs, now just on the left, h/o multiple prior SBOs (resolved with conservative management), H/O DVT, anemia, RBBB with first degree AV block noted on EKG on 09/13/21 per care everywhere at cardiology office in [facility]   ? ?He is currently here in [place] and was eating breakfast yesterday and was able to have a BM in his ileostomy bag.  Right after this he began having diffuse abdominal pain that was crampy in nature.  He then developed nausea and forced himself to vomit 3 times.  Due to persistent pain and having been through this before, he presented to the ED for evaluation.  Upon arrival, he vomited one more time and a significant amount.  He underwent a work up with a CT scan that has been reviewed and reveals significant fecalization of his jejunum with evidence of a high-grade SBO.  He is being transferred to [facility] for management of his SBO. ? ?ROS: ?ROS: Please see HPI, otherwise all other systems have currently been reviewed. ? ?No family history on file. ? ?Past Medical History:  ?Diagnosis Date  ? Anemia   ? Chronic kidney disease   ? ESRD (end stage renal disease) ([place]) 10/22/2021  ? History of DVT (deep vein thrombosis)   ? Hydronephrosis   ? managed wtih a PCN  ? Idiopathic neuropathy 10/22/2021  ? lyrica   ? Orthostatic hypotension 10/22/2021  ? Ulcerative colitis ([place])   ? Ureteral stricture   ? secondary to injury during surgery  ? ? ?Past Surgical History:  ?Procedure Laterality Date  ? COLON SURGERY    ? creation of j pouch    ? and subsequent takedown of j pouch  ? EYE SURGERY    ? SMALL INTESTINE SURGERY    ? TOTAL  COLECTOMY    ? ? ?Social History:  reports that he has quit smoking. His smoking use included cigarettes. He has never used smokeless tobacco. He reports current alcohol use of about 5.0 standard drinks per week. He reports that he does not use drugs. ? ?Allergies: No Known Allergies ? ?Medications Prior to Admission  ?Medication Sig Dispense Refill  ? latanoprost (XALATAN) 0.005 % ophthalmic solution 1 drop at bedtime.    ? midodrine (PROAMATINE) 10 MG tablet Take 1 tablet (10 mg total) by mouth 3 (three) times daily. Takes 5 mg plus 25m for 15 mg total three times a day- started by nephrology in [place] 270 tablet 3  ? midodrine (PROAMATINE) 5 MG tablet Take 1 tablet (5 mg total) by mouth 3 (three) times daily with meals. Takes 5 mg plus 172mfor 15 mg total three times a day[place]  ? pregabalin (LYRICA) 50 MG capsule Take 1 capsule (50 mg total) by mouth daily. 90 capsule 3  ? sevelamer carbonate (RENVELA) 800 MG tablet Take 1,600 mg by mouth 3 (three) times daily with meals.    ? sodium bicarbonate 650 MG tablet Take 650 mg by mouth 2 (two) times daily.    ? timolol (TIMOPTIC) 0.5 % ophthalmic solution 1 drop 2 (two) times daily.    ? ? ? ?  Physical Exam: ?Blood pressure (!) 80/19, pulse 100, temperature 99.7 ?F (37.6 ?C), temperature source Oral, resp. rate 18, height 5' 8"  (1.727 m), weight 76.2 kg, SpO2 96 %. ?General: pleasant, WD, WN white male who is laying in bed in NAD while in HD ?HEENT: head is normocephalic, atraumatic.  Sclera are noninjected.  PERRL.  Ears and nose without any masses or lesions, NGT in place with some watered down bilious appearing output.  Mouth is pink and dry ?Heart: mildly tachy and irregular.  Normal s1,s2. No obvious murmurs, gallops, or rubs noted.  ?Lungs: CTAB, no wheezes, rhonchi, or rales noted.  Respiratory effort nonlabored ?Abd: soft, not really tender at the moment, ND, hypoactive BS, ileostomy in place in RLQ with minimal  liquid output (opaque bag so difficult to see and I don't know how long that has been there.  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 no masses or organomegaly.  Multiple scars from prior surgery. ?Psych: A&Ox3 with an appropriate affect. ? ? ?Results for orders placed or performed during the hospital encounter of 11/17/21 (from the past 48 hour(s))  ?Lipase, blood     Status: Abnormal  ? Collection Time: 11/17/21 10:11 PM  ?Result Value Ref Range  ? Lipase 70 (H) 11 - 51 U/L  ?  Comment: Performed at [facility], [address] [place], [place] [address]  ?Comprehensive metabolic panel     Status: Abnormal  ? Collection Time: 11/17/21 10:11 PM  ?Result Value Ref Range  ? Sodium 142 135 - 145 mmol/L  ? Potassium 6.0 (H) 3.5 - 5.1 mmol/L  ? Chloride 90 (L) 98 - 111 mmol/L  ? CO2 30 22 - 32 mmol/L  ? Glucose, Bld 160 (H) 70 - 99 mg/dL  ?  Comment: Glucose reference range applies only to samples taken after fasting for at least 8 hours.  ? BUN 67 (H) 8 - 23 mg/dL  ? Creatinine, Ser 11.05 (H) 0.61 - 1.24 mg/dL  ? Calcium 11.0 (H) 8.9 - 10.3 mg/dL  ? Total Protein 9.1 (H) 6.5 - 8.1 g/dL  ? Albumin 5.2 (H) 3.5 - 5.0 g/dL  ? AST 29 15 - 41 U/L  ? ALT 31 0 - 44 U/L  ? Alkaline Phosphatase 86 38 - 126 U/L  ? Total Bilirubin 0.8 0.3 - 1.2 mg/dL  ? GFR, Estimated 4 (L) >60 mL/min  ?  Comment: (NOTE) ?Calculated using the CKD-EPI Creatinine Equation (2021) ?  ? Anion gap 22 (H) 5 - 15  ?  Comment: Performed at [facility], [address] [place], [place] [address]  ?CBC     Status: Abnormal  ? Collection Time: 11/17/21 10:11 PM  ?Result Value Ref Range  ? WBC 15.2 (H) 4.0 - 10.5 K/uL  ? RBC 2.73 (L) 4.22 - 5.81 MIL/uL  ? Hemoglobin 11.0 (L) 13.0 - 17.0 g/dL  ? HCT 32.9 (L) 39.0 - 52.0 %  ? MCV 120.5 (H) 80.0 - 100.0 fL  ? MCH 40.3 (H) 26.0 - 34.0 pg  ? MCHC 33.4 30.0 - 36.0 g/dL  ? RDW 14.9 11.5 - 15.5 %  ? Platelets 175 150 - 400 K/uL  ? nRBC  0.0 0.0 - 0.2 %  ?  Comment: Performed at [facility], [address], [place], [place] [address]  ?Lactic acid, plasma     Status: Abnormal  ? Collection Time: 11/17/21 10:56 PM  ?Result Value Ref Range  ?  Lactic Acid, Venous 2.8 (HH) 0.5 - 1.9 mmol/L  ?  Comment: CRITICAL RESULT CALLED TO, READ BACK BY AND VERIFIED WITH: ?Lorenza Evangelist RN @ [phone] ON 11/17/21 BY AV ?Performed at [facility], [address] [place], [place] [address] ?  ?Lactic acid, plasma     Status: Abnormal  ? Collection Time: 11/18/21 12:44 AM  ?Result Value Ref Range  ? Lactic Acid, Venous 2.3 (HH) 0.5 - 1.9 mmol/L  ?  Comment: CRITICAL VALUE NOTED.  VALUE IS CONSISTENT WITH PREVIOUSLY REPORTED AND CALLED VALUE. ?Performed at [facility], [address] [place], [place] [address] ?  ?CBG monitoring, ED     Status: Abnormal  ? Collection Time: 11/18/21  1:54 AM  ?Result Value Ref Range  ? Glucose-Capillary 114 (H) 70 - 99 mg/dL  ?  Comment: Glucose reference range applies only to samples taken after fasting for at least 8 hours.  ?CBG monitoring, ED     Status: Abnormal  ? Collection Time: 11/18/21  2:58 AM  ?Result Value Ref Range  ? Glucose-Capillary 177 (H) 70 - 99 mg/dL  ?  Comment: Glucose reference range applies only to samples taken after fasting for at least 8 hours.  ?Basic metabolic panel     Status: Abnormal  ? Collection Time: 11/18/21  6:09 AM  ?Result Value Ref Range  ? Sodium 141 135 - 145 mmol/L  ? Potassium 6.7 (HH) 3.5 - 5.1 mmol/L  ?  Comment: CRITICAL RESULT CALLED TO, READ BACK BY AND VERIFIED WITH: ?Lorenza Evangelist RN @ [phone] ON 11/18/21 BY AV ?  ? Chloride 90 (L) 98 - 111 mmol/L  ? CO2 30 22 - 32 mmol/L  ? Glucose, Bld 119 (H) 70 - 99 mg/dL  ?  Comment: Glucose reference range applies only to samples taken after fasting for at least 8 hours.  ? BUN 69 (H) 8 - 23 mg/dL  ? Creatinine, Ser 11.29 (H) 0.61 - 1.24 mg/dL  ? Calcium 10.0 8.9 - 10.3 mg/dL  ? GFR, Estimated 4 (L) >60  mL/min  ?  Comment: (NOTE) ?Calculated using the CKD-EPI Creatinine Equation (2021) ?  ? Anion gap 21 (H) 5 - 15  ?  Comment: Performed at [facility], [address], [place], [place]

## 2021-11-18 NOTE — ED Provider Notes (Addendum)
?[facility] EMERGENCY DEPT ?Provider Note ? ? ?CSN: [id number] ?Arrival date & time: 11/17/21  2130 ? ?  ? ?History ? ?Chief Complaint  ?Patient presents with  ? Abdominal Pain  ? ? ?Joshua Riley is a 86 y.o. male. ? ?HPI ? ?  ? ?This is an 86 year old male with a history of end-stage renal disease on dialysis Monday, Wednesday, Friday, total colectomy with colostomy and prior history of obstruction who presents with decreased colostomy output and nausea and vomiting.  Patient reports onset of symptoms was earlier this morning.  He states he ate a large meal last night.  He has had minimal to no ostomy output since 630 this morning.  He has had multiple episodes of nonbilious, nonbloody emesis.  Patient states that this feels like his prior obstructions.  Also reporting abdominal pain.  No fevers.  Denies urinary symptoms.  Patient does make some urine. ? ?Home Medications ?Prior to Admission medications   ?Medication Sig Start Date End Date Taking? Authorizing Provider  ?latanoprost (XALATAN) 0.005 % ophthalmic solution 1 drop at bedtime.    [provider]  ?midodrine (PROAMATINE) 10 MG tablet Take 1 tablet (10 mg total) by mouth 3 (three) times daily. Takes 5 mg plus 31m for 15 mg total three times a day- [facility]   HMarin Olp MD  ?midodrine (PROAMATINE) 5 MG tablet Take 1 tablet (5 mg total) by mouth 3 (three) times daily with meals. Takes 5 mg plus 175mfor 15 mg total three times a day- [facility]   HuMarin OlpMD  ?pregabalin (LYRICA) 50 MG capsule Take 1 capsule (50 mg total) by mouth daily. 10/22/21   HuMarin OlpMD  ?sevelamer carbonate (RENVELA) 800 MG tablet Take 1,600 mg by mouth 3 (three) times daily with meals.    [provider]  ?sodium bicarbonate 650 MG tablet Take 650 mg by mouth 2 (two) times daily.    [provider]  ?timolol (TIMOPTIC) 0.5 % ophthalmic solution 1 drop  2 (two) times daily.    [provider]  ?   ? ?Allergies    ?Patient has no known allergies.   ? ?Review of Systems   ?Review of Systems  ?Constitutional:  Negative for fever.  ?Gastrointestinal:  Positive for abdominal pain, constipation, nausea and vomiting.  ?All other systems reviewed and are negative. ? ?Physical Exam ?Updated Vital Signs ?BP (!) 100/58   Pulse (!) 102   Temp 99.6 ?F (37.6 ?C)   Resp 18   Ht 1.727 m (5' 8" )   Wt 76.2 kg   SpO2 93%   BMI 25.54 kg/m?  ?Physical Exam ?Vitals and nursing note reviewed.  ?Constitutional:   ?   Appearance: He is well-developed.  ?   Comments: Elderly, nontoxic-appearing  ?HENT:  ?   Head: Normocephalic and atraumatic.  ?   Mouth/Throat:  ?   Mouth: Mucous membranes are moist.  ?Eyes:  ?   Pupils: Pupils are equal, round, and reactive to light.  ?Cardiovascular:  ?   Rate and Rhythm: Normal rate and regular rhythm.  ?   Heart sounds: Normal heart sounds. No murmur heard. ?Pulmonary:  ?   Effort: Pulmonary effort is normal. No respiratory distress.  ?   Breath sounds: Normal breath sounds. No wheezing.  ?Abdominal:  ?   General: Bowel sounds are increased. There is distension.  ?   Palpations: Abdomen is soft.  ?  Tenderness: There is generalized abdominal tenderness.  ?   Comments: Slight distended noted, extensive ventral abdominal scarring, ostomy right mid abdomen  ?Musculoskeletal:  ?   Cervical back: Neck supple.  ?Lymphadenopathy:  ?   Cervical: No cervical adenopathy.  ?Skin: ?   General: Skin is warm and dry.  ?Neurological:  ?   General: No focal deficit present.  ?   Mental Status: He is alert and oriented to person, place, and time.  ? ? ?ED Results / Procedures / Treatments   ?Labs ?(all labs ordered are listed, but only abnormal results are displayed) ?Labs Reviewed  ?LIPASE, BLOOD - Abnormal; Notable for the following components:  ?    Result Value  ? Lipase 70 (*)   ? All other components within normal limits  ?COMPREHENSIVE METABOLIC  PANEL - Abnormal; Notable for the following components:  ? Potassium 6.0 (*)   ? Chloride 90 (*)   ? Glucose, Bld 160 (*)   ? BUN 67 (*)   ? Creatinine, Ser 11.05 (*)   ? Calcium 11.0 (*)   ? Total Protein 9.1 (*)   ? Albumin 5.2 (*)   ? GFR, Estimated 4 (*)   ? Anion gap 22 (*)   ? All other components within normal limits  ?CBC - Abnormal; Notable for the following components:  ? WBC 15.2 (*)   ? RBC 2.73 (*)   ? Hemoglobin 11.0 (*)   ? HCT 32.9 (*)   ? MCV 120.5 (*)   ? MCH 40.3 (*)   ? All other components within normal limits  ?LACTIC ACID, PLASMA - Abnormal; Notable for the following components:  ? Lactic Acid, Venous 2.8 (*)   ? All other components within normal limits  ?LACTIC ACID, PLASMA - Abnormal; Notable for the following components:  ? Lactic Acid, Venous 2.3 (*)   ? All other components within normal limits  ?BASIC METABOLIC PANEL - Abnormal; Notable for the following components:  ? Potassium 6.7 (*)   ? Chloride 90 (*)   ? Glucose, Bld 119 (*)   ? BUN 69 (*)   ? Creatinine, Ser 11.29 (*)   ? GFR, Estimated 4 (*)   ? Anion gap 21 (*)   ? All other components within normal limits  ?CBG MONITORING, ED - Abnormal; Notable for the following components:  ? Glucose-Capillary 114 (*)   ? All other components within normal limits  ?CBG MONITORING, ED - Abnormal; Notable for the following components:  ? Glucose-Capillary 177 (*)   ? All other components within normal limits  ?URINALYSIS, ROUTINE W REFLEX MICROSCOPIC  ? ? ?EKG ?EKG Interpretation ? ?Date/Time:  Sunday Nov 17 2021 23:23:43 EDT ?Ventricular Rate:  86 ?PR Interval:  271 ?QRS Duration: 135 ?QT Interval:  405 ?QTC Calculation: 485 ?R Axis:   134 ?Text Interpretation: Sinus or ectopic atrial rhythm Prolonged PR interval Consider dextrocardia Confirmed by Thayer Jew [phone]) on 11/18/2021 12:41:00 AM ? ?Radiology ?CT ABDOMEN PELVIS WO CONTRAST ? ?Result Date: 11/18/2021 ?CLINICAL DATA:  Nausea and vomiting with abdominal pain, initial encounter  EXAM: CT ABDOMEN AND PELVIS WITHOUT CONTRAST TECHNIQUE: Multidetector CT imaging of the abdomen and pelvis was performed following the standard protocol without IV contrast. RADIATION DOSE REDUCTION: This exam was performed according to the departmental dose-optimization program which includes automated exposure control, adjustment of the mA and/or kV according to patient size and/or use of iterative reconstruction technique. COMPARISON:  None Available. FINDINGS: Lower chest: Mild atelectatic changes  are noted in the left base. Hepatobiliary: Liver is within normal limits. Gallbladder is well distended with a single dependent gallstone. No biliary ductal dilatation is seen. Pancreas: Unremarkable. No pancreatic ductal dilatation or surrounding inflammatory changes. Spleen: Normal in size without focal abnormality. Adrenals/Urinary Tract: Adrenal glands are within normal limits. Kidneys are somewhat atrophied with evidence of left-sided percutaneous nephrostomy catheter. Scattered small cysts are noted bilaterally measuring less than 1 cm. No follow-up is recommended. No renal calculi or obstructive changes are seen. Bladder is decompressed. Stomach/Bowel: The colon has been surgically removed. A right-sided ileostomy is noted. The ileum just proximal to ileostomy appears within normal limits although in the mid abdomen and anterior pelvis there are multiple dilated loops of jejunum with significant fecalization of small bowel contents in the distal jejunum. There are changes suggestive of internal hernia causing the obstructive change best noted on the coronal image number 63 of series 5. Herniation of a small bowel loop into the ostomy defect just below the ostomy loop is noted. The distal ileum appears within normal limits. Vascular/Lymphatic: Aortic atherosclerosis. No enlarged abdominal or pelvic lymph nodes. Right femoral dialysis catheter is seen. This extends to the level of the renal veins in the IVC.  Dialysis loop is noted in the left thigh which may be occluded. Correlate with clinical findings. Reproductive: Prostate is within normal limits. A penile prosthesis reservoir is noted in the right lower quadrant.

## 2021-11-18 NOTE — Consult Note (Signed)
Renal Service ?Consult Note ?[facility] ? ?Langston Reusing ?11/18/2021 ?[name], MD ?Requesting Physician: Dr. Lorin Mercy ? ?Reason for Consult: ESRD pt w/  ?HPI: The patient is a 86 y.o. year-old w/ hx of ESRD on HD, anemia, neuropathy, sp colectomy/ colostomy presented to ED for N/V and dec'd ostomy output. Multiple episodes of bilious vomiting. Hx of prior intestinal obstructions. In ED low grade temp 99.4, wbc 15k , BP 80-110/ 56-76, HR 99, RR 18, temp 99.7, on RA 96%. Labs show K 6.0 last night then repeat this am K+ up to 6.7. Creat 11.2, cO2 30.  Pt had SBO by imaging. NG was placed. We are asked to see for HD.  ? ?Pt seen in room. No distress, NG tube in place. Pt lives alone, drives to HD. On HD 1.5 yrs, started HD in [facility] and moved here about 3 wks ago and is getting HD at [facility] on a MWF schedule.  ? ?Denies any CP or SOB.   ? ?ROS - denies CP, no joint pain, no HA, no blurry vision, no rash, no diarrhea, no nausea/ vomiting ? ? ?Past Medical History  ?Past Medical History:  ?Diagnosis Date  ? Anemia   ? Chronic kidney disease   ? ESRD (end stage renal disease) ([place]) 10/22/2021  ? Idiopathic neuropathy 10/22/2021  ? lyrica   ? Orthostatic hypotension 10/22/2021  ? ?Past Surgical History  ?Past Surgical History:  ?Procedure Laterality Date  ? COLON SURGERY    ? EYE SURGERY    ? SMALL INTESTINE SURGERY    ? ?Family History No family history on file. ?Social History  reports that he has quit smoking. His smoking use included cigarettes. He has never used smokeless tobacco. He reports current alcohol use of about 5.0 standard drinks per week. He reports that he does not use drugs. ?Allergies No Known Allergies ?Home medications ?Prior to Admission medications   ?Medication Sig Start Date End Date Taking? Authorizing Provider  ?latanoprost (XALATAN) 0.005 % ophthalmic solution 1 drop at bedtime.    [provider]  ?midodrine (PROAMATINE) 10 MG tablet Take 1 tablet (10 mg total) by  mouth 3 (three) times daily. Takes 5 mg plus 50m for 15 mg total three times a day- [facility]   HMarin Olp MD  ?midodrine (PROAMATINE) 5 MG tablet Take 1 tablet (5 mg total) by mouth 3 (three) times daily with meals. Takes 5 mg plus 114mfor 15 mg total three times a day- [facility]   HuMarin OlpMD  ?pregabalin (LYRICA) 50 MG capsule Take 1 capsule (50 mg total) by mouth daily. 10/22/21   HuMarin OlpMD  ?propranolol (INDERAL) 10 MG tablet TAKE 1 TABLET BY MOUTH EVERY DAY 11/18/21   HuMarin OlpMD  ?sevelamer carbonate (RENVELA) 800 MG tablet Take 1,600 mg by mouth 3 (three) times daily with meals.    [provider]  ?sodium bicarbonate 650 MG tablet Take 650 mg by mouth 2 (two) times daily.    [provider]  ?timolol (TIMOPTIC) 0.5 % ophthalmic solution 1 drop 2 (two) times daily.    [provider]  ? ? ? ?Vitals:  ? 11/18/21 0545 11/18/21 0600 11/18/21 0622 11/18/21 0700  ?BP: 110/65 107/62 (!) 100/58 (!) 98/56  ?Pulse: 92 95 (!) 102 (!) 101  ?Resp: 16 18 18 18   ?Temp:   99.6 ?F (37.6 ?C) 99.2 ?F (37.3 ?  C)  ?TempSrc:    Oral  ?SpO2: 90% 92% 93% 98%  ?Weight:      ?Height:      ? ?Exam ?Gen alert, no distress, nasal O2, NG tube ?No rash, cyanosis or gangrene ?Sclera anicteric, throat clear  ?No jvd or bruits ?Chest clear bilat to bases, no rales/ wheezing ?RRR no MRG ?Abd soft ntnd no mass or ascites R abd colostomy ?GU normal male ?MS no joint effusions or deformity ?Ext no LE or UE edema, no wounds or ulcers ?Neuro is alert, Ox 3 , nf ?   R fem TDC intact ? ? ?Abd xray - IMPRESSION: Gastric catheter within the stomach.  Persistent small bowel dilatation. ? CT abd - IMPRESSION: Changes consistent with high-grade small bowel obstruction in the distal jejunum with diffuse fecalization of small bowel contents and jejunal dilation. Findings suggestive of internal hernia noted although these changes  may be related to significant adhesions. The ileum and ileostomy appear within normal limits although herniation of a loop of ileum adjacent to the ostomy is noted without ?complicating factors. Surgical consultation is recommended. Left nephrostomy catheter in satisfactory position. Cholelithiasis without complicating factors.Prior colectomy. ?  ? ? Home meds include - midodrine 15 mg tid, lyrica 50, inderal 10 mg qd, renvela 2 ac tid, sod bicarb 650 bid, prns/ drops ? ? ? OP HD: MWF [facility] ? 2.5h  350/500  76.5kg  2/2 bath  R fem TDC  Hep 2500 ?- last HD 5/12, 77 > 75.9kg ?- last Hb 9.7 on 5/10 ?- mircera 60 q 2, last 5/3 (due 5/17) ?- rocaltrol 1 ug po tiw ? ? ?Assessment/ Plan: ?SBO - high-grade, per gen surgery. NG in.  ?Hx of partial colectomy/ colostomy ?Hyperkalemia - K+ 6.0 on presentation, repeat this am is up to 6.7. Low K+ bath w/ HD, HD asap.  ?Anemia ckd - next esa due Wed 5/17. Hb 11 here.  ?MBD ckd - Ca high at 11, check phos, hold vdra. Cont sod bicarb and renvela.  ?BP/ vol - no fluid off, under dry wt w/ recent vomiting ?  ? ? ? ?Kelly Splinter  MD ?11/18/2021, 9:46 AM ?Recent Labs  ?Lab 11/17/21 ?2211 11/18/21 ?8841  ?HGB 11.0*  --   ?ALBUMIN 5.2*  --   ?CALCIUM 11.0* 10.0  ?CREATININE 11.05* 11.29*  ?K 6.0* 6.7*  ? ? ?

## 2021-11-19 ENCOUNTER — Inpatient Hospital Stay (HOSPITAL_COMMUNITY): Payer: Medicare Other

## 2021-11-19 DIAGNOSIS — K56609 Unspecified intestinal obstruction, unspecified as to partial versus complete obstruction: Secondary | ICD-10-CM | POA: Diagnosis not present

## 2021-11-19 LAB — BASIC METABOLIC PANEL
Anion gap: 19 — ABNORMAL HIGH (ref 5–15)
BUN: 51 mg/dL — ABNORMAL HIGH (ref 8–23)
CO2: 29 mmol/L (ref 22–32)
Calcium: 9.2 mg/dL (ref 8.9–10.3)
Chloride: 90 mmol/L — ABNORMAL LOW (ref 98–111)
Creatinine, Ser: 9.4 mg/dL — ABNORMAL HIGH (ref 0.61–1.24)
GFR, Estimated: 5 mL/min — ABNORMAL LOW (ref >60)
Glucose, Bld: 130 mg/dL — ABNORMAL HIGH (ref 70–99)
Potassium: 5.5 mmol/L — ABNORMAL HIGH (ref 3.5–5.1)
Sodium: 138 mmol/L (ref 135–145)

## 2021-11-19 LAB — LACTIC ACID, PLASMA: Lactic Acid, Venous: 2.3 mmol/L (ref 0.5–1.9)

## 2021-11-19 LAB — CBC
HCT: 29.1 % — ABNORMAL LOW (ref 39.0–52.0)
Hemoglobin: 9.6 g/dL — ABNORMAL LOW (ref 13.0–17.0)
MCH: 41.4 pg — ABNORMAL HIGH (ref 26.0–34.0)
MCHC: 33 g/dL (ref 30.0–36.0)
MCV: 125.4 fL — ABNORMAL HIGH (ref 80.0–100.0)
Platelets: 123 10*3/uL — ABNORMAL LOW (ref 150–400)
RBC: 2.32 MIL/uL — ABNORMAL LOW (ref 4.22–5.81)
RDW: 14.8 % (ref 11.5–15.5)
WBC: 7.6 10*3/uL (ref 4.0–10.5)
nRBC: 0 % (ref 0.0–0.2)

## 2021-11-19 LAB — GLUCOSE, CAPILLARY
Glucose-Capillary: 119 mg/dL — ABNORMAL HIGH (ref 70–99)
Glucose-Capillary: 169 mg/dL — ABNORMAL HIGH (ref 70–99)

## 2021-11-19 LAB — HEPATITIS B SURFACE ANTIBODY, QUANTITATIVE: Hep B S AB Quant (Post): <3.1 m[IU]/mL — ABNORMAL LOW (ref >9.9)

## 2021-11-19 MED ORDER — ACETAMINOPHEN 160 MG/5ML PO SOLN: 650.0000 mg | Freq: Four times a day (QID) | ORAL | Status: DC | PRN

## 2021-11-19 MED ORDER — TIMOLOL MALEATE 0.5 % OP SOLN
1.0000 [drp] | Freq: Every day | OPHTHALMIC | Status: DC
  Administered 2021-11-19: 1 [drp] via OPHTHALMIC

## 2021-11-19 MED ORDER — APIXABAN 2.5 MG PO TABS: 2.5000 mg | ORAL_TABLET | Freq: Two times a day (BID) | ORAL | 2 refills | Status: DC

## 2021-11-19 MED ORDER — SODIUM ZIRCONIUM CYCLOSILICATE 10 G PO PACK
10.0000 g | PACK | Freq: Once | ORAL | Status: AC
  Administered 2021-11-19: 10 g via ORAL
  Filled 2021-11-19: qty 1

## 2021-11-19 NOTE — Progress Notes (Signed)
Mobility Specialist Progress Note: ? ? 11/19/21 1336  ?Mobility  ?Activity Ambulated with assistance in hallway  ?Level of Assistance Standby assist, set-up cues, supervision of patient - no hands on  ?Assistive Device None  ?Distance Ambulated (ft) 570 ft  ?Activity Response Tolerated well  ?$Mobility charge 1 Mobility  ? ?Pt received in chair willing to participate in mobility. No complaints of pain. Left in bed with call bell in reach and all needs met.  ? ?Joshua Riley ?Mobility Specialist ?Primary Phone [phone] ? ?

## 2021-11-19 NOTE — Progress Notes (Signed)
? ? ?   ?Subjective: ?CC: ?Feeling better. Abd pain resolved. No n/v. Having ostomy output (air and liquid stool) ? ?Objective: ?Vital signs in last 24 hours: ?Temp:  [97.8 ?F (36.6 ?C)-99.7 ?F (37.6 ?C)] 97.8 ?F (36.6 ?C) (05/16 0737) ?Pulse Rate:  [50-111] 82 (05/16 0737) ?Resp:  [16-20] 16 (05/16 0737) ?BP: (70-122)/(19-96) 77/53 (05/16 0737) ?SpO2:  [92 %-96 %] 93 % (05/16 0737) ?Weight:  [75.7 kg] 75.7 kg (05/15 1146) ?Last BM Date : 11/18/21 ? ?Intake/Output from previous day: ?05/15 0701 - 05/16 0700 ?In: -  ?Out: 800 [Emesis/NG output:800] ?Intake/Output this shift: ?No intake/output data recorded. ? ?PE: ?Gen:  Alert, NAD, pleasant ?Abd: Soft, ND, NT, +BS, ostomy w/ air and liquid stool in bag.  ? ?Lab Results:  ?Recent Labs  ?  11/17/21 ?2211 11/19/21 ?2330  ?WBC 15.2* 7.6  ?HGB 11.0* 9.6*  ?HCT 32.9* 29.1*  ?PLT 175 123*  ? ?BMET ?Recent Labs  ?  11/18/21 ?1908 11/19/21 ?0762  ?NA 139 138  ?K 6.0* 5.5*  ?CL 92* 90*  ?CO2 30 29  ?GLUCOSE 121* 130*  ?BUN 44* 51*  ?CREATININE 8.65* 9.40*  ?CALCIUM 9.1 9.2  ? ?PT/INR ?No results for input(s): LABPROT, INR in the last 72 hours. ?CMP  ?   ?Component Value Date/Time  ? NA 138 11/19/2021 0044  ? K 5.5 (H) 11/19/2021 0044  ? CL 90 (L) 11/19/2021 0044  ? CO2 29 11/19/2021 0044  ? GLUCOSE 130 (H) 11/19/2021 0044  ? BUN 51 (H) 11/19/2021 0044  ? CREATININE 9.40 (H) 11/19/2021 0044  ? CALCIUM 9.2 11/19/2021 0044  ? PROT 9.1 (H) 11/17/2021 2211  ? ALBUMIN 5.2 (H) 11/17/2021 2211  ? AST 29 11/17/2021 2211  ? ALT 31 11/17/2021 2211  ? ALKPHOS 86 11/17/2021 2211  ? BILITOT 0.8 11/17/2021 2211  ? GFRNONAA 5 (L) 11/19/2021 0044  ? ?Lipase  ?   ?Component Value Date/Time  ? LIPASE 70 (H) 11/17/2021 2211  ? ? ?Studies/Results: ?CT ABDOMEN PELVIS WO CONTRAST ? ?Result Date: 11/18/2021 ?CLINICAL DATA:  Nausea and vomiting with abdominal pain, initial encounter EXAM: CT ABDOMEN AND PELVIS WITHOUT CONTRAST TECHNIQUE: Multidetector CT imaging of the abdomen and pelvis was  performed following the standard protocol without IV contrast. RADIATION DOSE REDUCTION: This exam was performed according to the departmental dose-optimization program which includes automated exposure control, adjustment of the mA and/or kV according to patient size and/or use of iterative reconstruction technique. COMPARISON:  None Available. FINDINGS: Lower chest: Mild atelectatic changes are noted in the left base. Hepatobiliary: Liver is within normal limits. Gallbladder is well distended with a single dependent gallstone. No biliary ductal dilatation is seen. Pancreas: Unremarkable. No pancreatic ductal dilatation or surrounding inflammatory changes. Spleen: Normal in size without focal abnormality. Adrenals/Urinary Tract: Adrenal glands are within normal limits. Kidneys are somewhat atrophied with evidence of left-sided percutaneous nephrostomy catheter. Scattered small cysts are noted bilaterally measuring less than 1 cm. No follow-up is recommended. No renal calculi or obstructive changes are seen. Bladder is decompressed. Stomach/Bowel: The colon has been surgically removed. A right-sided ileostomy is noted. The ileum just proximal to ileostomy appears within normal limits although in the mid abdomen and anterior pelvis there are multiple dilated loops of jejunum with significant fecalization of small bowel contents in the distal jejunum. There are changes suggestive of internal hernia causing the obstructive change best noted on the coronal image number 63 of series 5. Herniation of a small bowel loop into the  ostomy defect just below the ostomy loop is noted. The distal ileum appears within normal limits. Vascular/Lymphatic: Aortic atherosclerosis. No enlarged abdominal or pelvic lymph nodes. Right femoral dialysis catheter is seen. This extends to the level of the renal veins in the IVC. Dialysis loop is noted in the left thigh which may be occluded. Correlate with clinical findings. Reproductive:  Prostate is within normal limits. A penile prosthesis reservoir is noted in the right lower quadrant. Other: No abdominal wall hernia or abnormality. No abdominopelvic ascites. Musculoskeletal: Degenerative changes of the lumbar spine are noted. IMPRESSION: Changes consistent with high-grade small bowel obstruction in the distal jejunum with diffuse fecalization of small bowel contents and jejunal dilation. Findings suggestive of internal hernia noted although these changes may be related to significant adhesions. The ileum and ileostomy appear within normal limits although herniation of a loop of ileum adjacent to the ostomy is noted without complicating factors. Surgical consultation is recommended. Left nephrostomy catheter in satisfactory position. Cholelithiasis without complicating factors. Prior colectomy. Changes consistent with the known history of end-stage renal disease. Electronically Signed   By: Inez Catalina M.D.   On: 11/18/2021 00:14  ? ?DG Abdomen 1 View ? ?Result Date: 11/18/2021 ?CLINICAL DATA:  Check gastric catheter placement EXAM: ABDOMEN - 1 VIEW COMPARISON:  None Available. FINDINGS: Gastric catheter is noted within the stomach. Left nephrostomy catheter and right femoral dialysis catheter are noted. Multiple dilated loops of small bowel are noted similar to that seen on recent CT examination. IMPRESSION: Gastric catheter within the stomach. Persistent small bowel dilatation. Electronically Signed   By: Inez Catalina M.D.   On: 11/18/2021 00:59  ? ?DG Abd Portable 1V-Small Bowel Obstruction Protocol-initial, 8 hr delay ? ?Result Date: 11/19/2021 ?CLINICAL DATA:  8 hour small-bowel follow up EXAM: PORTABLE ABDOMEN - 1 VIEW COMPARISON:  11/18/2021 FINDINGS: Gastric catheter remains in the stomach. Left nephrostomy catheter and dialysis catheter are noted. Ileostomy is again noted in the right mid abdomen. Persistent small bowel dilatation is noted. Administered contrast appears to lie within the  ileostomy back. IMPRESSION: Persistent small bowel dilatation. Contrast material appears to lie within the ileostomy bag consistent with a partial small bowel obstruction. Electronically Signed   By: Inez Catalina M.D.   On: 11/19/2021 04:05   ? ?Anti-infectives: ?Anti-infectives (From admission, onward)  ? ? None  ? ?  ? ? ? ?Assessment/Plan ?SBO, setting of multiple prior abdominal operations ?- Hx TAC with creation of aj pouch for ulcerative colitis followed by takedown and creation of a permanent ileostomy secondary to some type of complication.  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   ?- CT reviewed. He is not ill appearing and does not act like someone who has an internal hernia. Suspect SBO secondary to extensive adhesive disease.  Lactate downtrending on last check but was not rechecked again. WBC wnl. Symptoms have resolved and he is having bowel function. He is NT on exam. Xray this am w/ contrast that has made it to the ileostomy.  ?- Clamp NGT. Allow cld. Possible d/c NGT this PM.  ?- Hopefully patient will continue to improve with conservative management. ?- Agree with medical admission. We will follow with you. ? ?FEN - Clamp NGT (Will PM check) CLD. IVF per TRH ?VTE - ok for chemical  prophylaxis (appeared to be on eliquis in march of this year but only for 30 days leading up to a vascular procedure) ?ID - none currently needed from surgical standpoint ? ?H/O UC ?H/O DVT ?H/O RBBB ?Anemia ?Irregular HR - check EKG.  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 ? LOS: 1 day  ? ? ?Jillyn Ledger , PA-C ?[facility] ?11/19/2021, 8:30 AM ?Please see Amion for pager number during day hours 7:00am-4:30pm ? ?

## 2021-11-19 NOTE — Progress Notes (Signed)
?PROGRESS NOTE ? ? ? Joshua Riley  PVX:[id number] DOB: [birth year] DOA: 11/17/2021 ?PCP: Marin Olp, MD  ? ? ?Brief Narrative:  ? ?Joshua Riley is a 86 y.o. male with past medical history significant for ESRD on HD, chronic hypotension on midodrine, neuropathy, history of ulcerative colitis s/p multiple abdominal surgeries resulting in TAC and ileostomy with ureteral injuries and stenosis resulting in bilateral PCNs, now just on the left, history of multiple prior SBO's that have improved with conservative management, history of DVT, anemia, RBBB presented to [place] ED on 5/14 with diffuse abdominal pain associated with nausea and vomiting. ? ?In the ED, temperature 99.6 ?F, HR 105, RR 15, BP 113/66, SPO2 99% on room air.  Sodium 142, potassium 6.0, chloride 90, CO2 30, glucose 160, BUN 67, creatinine 11.05.  Lipase at 70, AST 29, ALT 31, total bilirubin 0.8.  Lactic acid 2.8.  WBC 15.2, hemoglobin 11.0, platelets 175.  CT abdomen/pelvis with changes consistent with high-grade small bowel obstruction in the distal jejunum with diffuse fecalization of small bowel contents and jejunal dilation, suspect related to significant adhesions.  Neurosurgery was consulted.  Patient was transferred to [facility] for further management and evaluation.  Hospitalist service consulted for admission. ? ?Assessment & Plan: ?  ?Small bowel obstruction ?Patient presenting to ED with acute onset diffuse abdominal pain associated with nausea/vomiting.  CT abdomen/pelvis with changes consistent with high-grade small bowel obstruction in the distal jejunum with diffuse fecalization of small bowel contents and jejunal dilation, suspect related to significant adhesions.  Etiology likely secondary to adhesions with history of multiple abdominal surgeries in the past. ?--General surgery following, appreciate assistance ?--Repeat KUB this morning with persistent small bowel dilation but contrast now appears to  lie within the ileostomy bag ?--NG tube now clamped, starting clear liquid diet ?--Further per general surgery ? ?Hyperkalemia ?Potassium 6.0 on arrival, underwent hemodialysis on 11/18/2021. ?--K 6.0>5.5 ?--Lokelma 10 g p.o. once today ?--Continue monitor on telemetry ? ?Atrial fibrillation, new onset ?Patient was noted to be in A-fib following HD yesterday.  Confirmed with EKG.  Not a good candidate for rate controlling medications due to his chronic hypotension on midodrine. ?--Hold anticoagulation for now given SBO ?--Will plan discharge on Eliquis ? ?ESRD on HD MWF ?--Nephrology following for continue HD while inpatient ? ?Chronic hypotension ?--Midodrine 15 mg PO TID ? ?Glaucoma ?--Continue latanoprost, timolol ? ? ? ? ?DVT prophylaxis: SCDs Start: 11/18/21 1129 ? ?  Code Status: Full Code ?Family Communication: No family present at bedside this morning ? ?Disposition Plan:  ?Level of care: Telemetry Medical ?Status is: Inpatient ?Remains inpatient appropriate because: General surgery clamping NG tube, start clear liquid diet, will need further advancement before medically stable for discharge. ?  ? ?Consultants:  ?General surgery ?Nephrology ? ?Procedures:  ?NG tube placement ? ?Antimicrobials:  ?None ? ? ?Subjective: ?Patient seen and examined at bedside, resting comfortably.  Reports flatus and bowel movement in ostomy bag.  Abdominal pain, nausea/vomiting now resolved.  Seen by general surgery this morning, now clamping NG tube and placed on a clear liquid diet.  No other questions or concerns at this time.  Denies headache, no chest pain, no palpitations, no shortness of breath, no current abdominal pain, no current nausea/vomiting/diarrhea, no fever/chills/night sweats, no fatigue, no paresthesia.  No acute events overnight per nursing staff. ? ?Objective: ?Vitals:  ? 11/18/21 2140 11/19/21 0113 11/19/21 0555 11/19/21 0737  ?BP: (!) 76/44 (!) 78/54 (!) 80/45 (!) 77/53  ?Pulse:  78 78 (!) 50 82  ?Resp:  16  16 16   ?Temp: 98.9 ?F (37.2 ?C) 98.7 ?F (37.1 ?C) 97.8 ?F (36.6 ?C) 97.8 ?F (36.6 ?C)  ?TempSrc: Oral Oral Oral Oral  ?SpO2: 92% 93% 93% 93%  ?Weight:      ?Height:      ? ? ?Intake/Output Summary (Last 24 hours) at 11/19/2021 1217 ?Last data filed at 11/18/2021 2200 ?Gross per 24 hour  ?Intake --  ?Output 800 ml  ?Net -800 ml  ? ?Filed Weights  ? 11/17/21 2159 11/18/21 1146  ?Weight: 76.2 kg 75.7 kg  ? ? ?Examination: ? ?Physical Exam: ?GEN: NAD, alert and oriented x 3, chronically ill in appearance ?HEENT: NCAT, PERRL, EOMI, sclera clear, MMM ?PULM: CTAB w/o wheezes/crackles, normal respiratory effort, on room air ?CV: RRR w/o M/G/R ?GI: abd soft, NTND, NABS, no R/G/M, NG tube noted in place, currently clamped, ostomy bag noted with soft stool in collection bag ?MSK: no peripheral edema, muscle strength globally intact 5/5 bilateral upper/lower extremities, femoral HD cath noted right groin ?NEURO: CN II-XII intact, no focal deficits, sensation to light touch intact ?PSYCH: normal mood/affect ?Integumentary: dry/intact, no rashes or wounds ? ? ? ?Data Reviewed: I have personally reviewed following labs and imaging studies ? ?CBC: ?Recent Labs  ?Lab 11/17/21 ?2211 11/19/21 ?7035  ?WBC 15.2* 7.6  ?HGB 11.0* 9.6*  ?HCT 32.9* 29.1*  ?MCV 120.5* 125.4*  ?PLT 175 123*  ? ?Basic Metabolic Panel: ?Recent Labs  ?Lab 11/17/21 ?2211 11/18/21 ?0609 11/18/21 ?1908 11/19/21 ?0093  ?NA 142 141 139 138  ?K 6.0* 6.7* 6.0* 5.5*  ?CL 90* 90* 92* 90*  ?CO2 30 30 30 29   ?GLUCOSE 160* 119* 121* 130*  ?BUN 67* 69* 44* 51*  ?CREATININE 11.05* 11.29* 8.65* 9.40*  ?CALCIUM 11.0* 10.0 9.1 9.2  ? ?GFR: ?Estimated Creatinine Clearance: 5.8 mL/min (A) (by C-G formula based on SCr of 9.4 mg/dL (H)). ?Liver Function Tests: ?Recent Labs  ?Lab 11/17/21 ?2211  ?AST 29  ?ALT 31  ?ALKPHOS 86  ?BILITOT 0.8  ?PROT 9.1*  ?ALBUMIN 5.2*  ? ?Recent Labs  ?Lab 11/17/21 ?2211  ?LIPASE 70*  ? ?No results for input(s): AMMONIA in the last 168 hours. ?Coagulation  Profile: ?No results for input(s): INR, PROTIME in the last 168 hours. ?Cardiac Enzymes: ?No results for input(s): CKTOTAL, CKMB, CKMBINDEX, TROPONINI in the last 168 hours. ?BNP (last 3 results) ?No results for input(s): PROBNP in the last 8760 hours. ?HbA1C: ?No results for input(s): HGBA1C in the last 72 hours. ?CBG: ?Recent Labs  ?Lab 11/18/21 ?0154 11/18/21 ?0258 11/19/21 ?0813 11/19/21 ?1126  ?GLUCAP 114* 177* 119* 169*  ? ?Lipid Profile: ?No results for input(s): CHOL, HDL, LDLCALC, TRIG, CHOLHDL, LDLDIRECT in the last 72 hours. ?Thyroid Function Tests: ?No results for input(s): TSH, T4TOTAL, FREET4, T3FREE, THYROIDAB in the last 72 hours. ?Anemia Panel: ?No results for input(s): VITAMINB12, FOLATE, FERRITIN, TIBC, IRON, RETICCTPCT in the last 72 hours. ?Sepsis Labs: ?Recent Labs  ?Lab 11/17/21 ?2256 11/18/21 ?8182 11/18/21 ?1908 11/19/21 ?9937  ?LATICACIDVEN 2.8* 2.3* 2.7* 2.3*  ? ? ?No results found for this or any previous visit (from the past 240 hour(s)).  ? ? ? ? ? ?Radiology Studies: ?CT ABDOMEN PELVIS WO CONTRAST ? ?Result Date: 11/18/2021 ?CLINICAL DATA:  Nausea and vomiting with abdominal pain, initial encounter EXAM: CT ABDOMEN AND PELVIS WITHOUT CONTRAST TECHNIQUE: Multidetector CT imaging of the abdomen and pelvis was performed following the standard protocol without IV contrast. RADIATION DOSE REDUCTION:  This exam was performed according to the departmental dose-optimization program which includes automated exposure control, adjustment of the mA and/or kV according to patient size and/or use of iterative reconstruction technique. COMPARISON:  None Available. FINDINGS: Lower chest: Mild atelectatic changes are noted in the left base. Hepatobiliary: Liver is within normal limits. Gallbladder is well distended with a single dependent gallstone. No biliary ductal dilatation is seen. Pancreas: Unremarkable. No pancreatic ductal dilatation or surrounding inflammatory changes. Spleen: Normal in size  without focal abnormality. Adrenals/Urinary Tract: Adrenal glands are within normal limits. Kidneys are somewhat atrophied with evidence of left-sided percutaneous nephrostomy catheter. Scattered small

## 2021-11-19 NOTE — Discharge Summary (Signed)
?Physician Discharge Summary  ?Joshua Riley DQQ:[id number] DOB: 1935-11-27 DOA: 11/17/2021 ? ?PCP: Marin Olp, MD ? ?Admit date: 11/17/2021 ?Discharge date: 11/19/2021 ? ?Admitted From: Home ?Disposition: Home ? ?Recommendations for Outpatient Follow-up:  ?Follow up with PCP in 1-2 weeks ?Continue soft diet for next few days and slowly advance as tolerates ?Discharged on Eliquis 2.5 mg p.o. twice daily for new onset atrial fibrillation. ? ?Home Health: No ?Equipment/Devices: None ? ?Discharge Condition: Stable ?CODE STATUS: Full code ?Diet recommendation: Soft diet, advance as tolerates ? ?History of present illness: ? ?Joshua Riley is a 86 y.o. male with past medical history significant for ESRD on HD, chronic hypotension on midodrine, neuropathy, history of ulcerative colitis s/p multiple abdominal surgeries resulting in TAC and ileostomy with ureteral injuries and stenosis resulting in bilateral PCNs, now just on the left, history of multiple prior SBO's that have improved with conservative management, history of DVT, anemia, RBBB presented to [place] ED on 5/14 with diffuse abdominal pain associated with nausea and vomiting. ?  ?In the ED, temperature 99.6 ?F, HR 105, RR 15, BP 113/66, SPO2 99% on room air.  Sodium 142, potassium 6.0, chloride 90, CO2 30, glucose 160, BUN 67, creatinine 11.05.  Lipase at 70, AST 29, ALT 31, total bilirubin 0.8.  Lactic acid 2.8.  WBC 15.2, hemoglobin 11.0, platelets 175.  CT abdomen/pelvis with changes consistent with high-grade small bowel obstruction in the distal jejunum with diffuse fecalization of small bowel contents and jejunal dilation, suspect related to significant adhesions.  Neurosurgery was consulted.  Patient was transferred to [facility] for further management and evaluation.  Hospitalist service consulted for admission. ? ?Hospital course: ? ?Small bowel obstruction ?Patient presenting to ED with acute onset diffuse abdominal pain  associated with nausea/vomiting.  CT abdomen/pelvis with changes consistent with high-grade small bowel obstruction in the distal jejunum with diffuse fecalization of small bowel contents and jejunal dilation, suspect related to significant adhesions. Etiology likely secondary to adhesions with history of multiple abdominal surgeries in the past.  General surgery was consulted and followed during hospital course.  NG tube was placed to low wall intermittent suction with improvement of symptoms.  NG tube was clamped and patient was started on a clear liquid diet with toleration and bowel movement.  Patient was further advanced to full liquids with toleration and okay for discharge home per general surgery.  Recommend soft diet for next few days and advance as tolerates. ?  ?Hyperkalemia ?Potassium 6.0 on arrival, underwent hemodialysis on 11/18/2021.  Received Lokelma 10 g p.o. once on 11/19/2021 for potassium 5.5.  Recommend BMP at next HD session. ?  ?Atrial fibrillation, new onset ?Patient was noted to be in A-fib following HD yesterday.  Confirmed with EKG.  Not a good candidate for rate controlling medications due to his chronic hypotension on midodrine.  Will discharge on Eliquis.  Outpatient follow-up with PCP. ?  ?ESRD on HD MWF ?Nephrology following for continue HD while inpatient ?  ?Chronic hypotension ?Midodrine 15 mg PO TID ?  ?Glaucoma ?Continue latanoprost, timolol ? ?Discharge Diagnoses:  ?Principal Problem: ?  SBO (small bowel obstruction) ([place]) ?Active Problems: ?  ESRD (end stage renal disease) ([place]) ?  Orthostatic hypotension ?  Colostomy status ([place]) ?  Glaucoma ?  New onset atrial fibrillation ([place]) ? ? ? ?Discharge Instructions ? ?Discharge Instructions   ? ? Call MD for:  difficulty breathing, headache or visual disturbances   Complete by: As directed ?  ? Call  MD for:  extreme fatigue   Complete by: As directed ?  ? Call MD for:  persistant dizziness or light-headedness   Complete by: As  directed ?  ? Call MD for:  persistant nausea and vomiting   Complete by: As directed ?  ? Call MD for:  severe uncontrolled pain   Complete by: As directed ?  ? Call MD for:  temperature >100.4   Complete by: As directed ?  ? Increase activity slowly   Complete by: As directed ?  ? No wound care   Complete by: As directed ?  ? ?  ? ?Allergies as of 11/19/2021   ? ?   Reactions  ? Cephalosporins Rash  ? Ciprofloxacin Itching, Rash  ? Baclofen Other (See Comments)  ? Altered mental status, after accidental overdose ?Altered mental status, after accidental overdose  ? ?  ? ?  ?Medication List  ?  ? ?TAKE these medications   ? ?acetaminophen 325 MG tablet ?Commonly known as: TYLENOL ?Take 650 mg by mouth daily as needed for fever or headache (pain). ?  ?apixaban 2.5 MG Tabs tablet ?Commonly known as: ELIQUIS ?Take 1 tablet (2.5 mg total) by mouth 2 (two) times daily. ?  ?IRON PO ?Take 1 tablet by mouth daily. ?  ?latanoprost 0.005 % ophthalmic solution ?Commonly known as: XALATAN ?Place 1 drop into both eyes at bedtime. ?  ?midodrine 10 MG tablet ?Commonly known as: PROAMATINE ?Take 1 tablet (10 mg total) by mouth 3 (three) times daily. Takes 5 mg plus 4m for 15 mg total three times a day- started by nephrology in [place]?  ?midodrine 5 MG tablet ?Commonly known as: PROAMATINE ?Take 1 tablet (5 mg total) by mouth 3 (three) times daily with meals. Takes 5 mg plus 198mfor 15 mg total three times a day- started by nephrology in [place]  ?OVER THE COUNTER MEDICATION ?Take 1 tablet by mouth daily. Caltrate PO ?  ?pregabalin 50 MG capsule ?Commonly known as: LYRICA ?Take 1 capsule (50 mg total) by mouth daily. ?What changed: when to take this ?  ?PRORENAL + D PO ?Take 1 tablet by mouth daily. ?  ?sevelamer carbonate 800 MG tablet ?Commonly known as: RENVELA ?Take 1,600 mg by mouth 3 (three) times daily with meals. ?  ?sodium bicarbonate 650 MG tablet ?Take 650 mg by mouth 2 (two) times daily. ?  ?timolol 0.5 %  ophthalmic solution ?Commonly known as: TIMOPTIC ?1 drop daily. ?  ?VITAMIN B 12 PO ?Take 1 tablet by mouth daily. ?  ? ?  ? ? Follow-up Information   ? ? HuMarin OlpMD. Schedule an appointment as soon as possible for a visit in 1 week(s).   ?Specialty: Family Medicine ?Contact information: ?[phone] ? ?  ?  ? ?  ?  ? ?  ? ?Allergies  ?Allergen Reactions  ? Cephalosporins Rash  ? Ciprofloxacin Itching and Rash  ? Baclofen Other (See Comments)  ?  Altered mental status, after accidental overdose ?Altered mental status, after accidental overdose ?  ? ? ?Consultations: ?General surgery ? ? ?Procedures/Studies: ?CT ABDOMEN PELVIS WO CONTRAST ? ?Result Date: 11/18/2021 ?CLINICAL DATA:  Nausea and vomiting with abdominal pain, initial encounter EXAM: CT ABDOMEN AND PELVIS WITHOUT CONTRAST TECHNIQUE: Multidetector CT imaging of the abdomen and pelvis was performed following the standard protocol without IV contrast. RADIATION DOSE REDUCTION: This exam was performed according to the departmental dose-optimization program  which includes automated exposure control, adjustment of the mA and/or kV according to patient size and/or use of iterative reconstruction technique. COMPARISON:  None Available. FINDINGS: Lower chest: Mild atelectatic changes are noted in the left base. Hepatobiliary: Liver is within normal limits. Gallbladder is well distended with a single dependent gallstone. No biliary ductal dilatation is seen. Pancreas: Unremarkable. No pancreatic ductal dilatation or surrounding inflammatory changes. Spleen: Normal in size without focal abnormality. Adrenals/Urinary Tract: Adrenal glands are within normal limits. Kidneys are somewhat atrophied with evidence of left-sided percutaneous nephrostomy catheter. Scattered small cysts are noted bilaterally measuring less than 1 cm. No follow-up is recommended. No renal calculi or obstructive changes are seen. Bladder  is decompressed. Stomach/Bowel: The colon has been surgically removed. A right-sided ileostomy is noted. The ileum just proximal to ileostomy appears within normal limits although in the mid abdomen and anterior pelvi

## 2021-11-19 NOTE — Progress Notes (Signed)
[facility] ?Progress Note ? ?Subjective: pt seen in room, bowels are working better and NG tube is clamped.  ? ?Vitals:  ? 11/18/21 2140 11/19/21 0113 11/19/21 0555 11/19/21 0737  ?BP: (!) 76/44 (!) 78/54 (!) 80/45 (!) 77/53  ?Pulse: 78 78 (!) 50 82  ?Resp:  16 16 16   ?Temp: 98.9 ?F (37.2 ?C) 98.7 ?F (37.1 ?C) 97.8 ?F (36.6 ?C) 97.8 ?F (36.6 ?C)  ?TempSrc: Oral Oral Oral Oral  ?SpO2: 92% 93% 93% 93%  ?Weight:      ?Height:      ? ? ?Exam: ?Gen alert, no distress, nasal O2, NG tube ?No jvd or bruits ?Chest clear bilat to bases ?RRR no MRG ?Abd soft ntnd no mass or ascites R abd colostomy ?Ext no LE edema ?Neuro is alert, Ox 3 , nf ?   R fem TDC intact ?  ?  ?Abd xray - IMPRESSION: Gastric catheter within the stomach.  Persistent small bowel dilatation. ? CT abd - IMPRESSION: Changes consistent with high-grade small bowel obstruction in the distal jejunum with diffuse fecalization of small bowel contents and jejunal dilation. Findings suggestive of internal hernia noted although these changes may be related to significant adhesions. The ileum and ileostomy appear within normal limits although herniation of a loop of ileum adjacent to the ostomy is noted without complicating factors. Surgical consultation is recommended. Left nephrostomy catheter in satisfactory position. Cholelithiasis without complicating factors.Prior colectomy. ?  ?  ? Home meds include - midodrine 15 mg tid, lyrica 50, inderal 10 mg qd, renvela 2 ac tid, sod bicarb 650 bid, prns/ drops ?  ?  ? OP HD: MWF [facility] ? 2.5h  350/500  76.5kg  2/2 bath  R fem TDC  Hep 2500 ?- last HD 5/12, 77 > 75.9kg ?- last Hb 9.7 on 5/10 ?- mircera 60 q 2, last 5/3 (due 5/17) ?- rocaltrol 1 ug po tiw ?  ?  ?Assessment/ Plan: ?SBO - resolving, per gen surgery. NG clamped now.   ?Hx of partial colectomy/ colostomy ?ESRD - on HD MWF. Had HD here yesterday, plan next HD tomorrow.  ?Hyperkalemia - K+ 6.7 yesterday > 5.5 this am. Get down further w/ HD tomorrow.   ?Anemia ckd - next esa due Wed 5/17. Hb 11 here.  ?MBD ckd - Ca better at 9.2, high Ca 11 was not real (due to dehydration). Cont sod bicarb and renvela.  ?BP/ vol - no UF w/ HD. Under dry wt w/ recent vomiting.  ?  ?  ?Rob Doctor, hospital ?11/19/2021, 2:06 PM ? ? ?Recent Labs  ?Lab 11/17/21 ?2211 11/18/21 ?0609 11/18/21 ?1908 11/19/21 ?1610  ?HGB 11.0*  --   --  9.6*  ?ALBUMIN 5.2*  --   --   --   ?CALCIUM 11.0*   < > 9.1 9.2  ?CREATININE 11.05*   < > 8.65* 9.40*  ?K 6.0*   < > 6.0* 5.5*  ? < > = values in this interval not displayed.  ? ?Inpatient medications: ? Chlorhexidine Gluconate Cloth  6 each Topical Q0600  ? latanoprost  1 drop Both Eyes QHS  ? midodrine  15 mg Per Tube TID  ? pregabalin  50 mg Per Tube Daily  ? sodium bicarbonate  650 mg Per Tube BID  ? timolol  1 drop Both Eyes Daily  ? ? ?acetaminophen (TYLENOL) oral liquid 160 mg/5 mL, [DISCONTINUED] acetaminophen **OR** acetaminophen, camphor-menthol **AND** hydrOXYzine, fentaNYL (SUBLIMAZE) injection, heparin, hydrALAZINE, ondansetron **OR** ondansetron (ZOFRAN) IV,  zolpidem ? ? ? ? ? ? ?

## 2021-11-20 ENCOUNTER — Telehealth: Payer: Self-pay

## 2021-11-20 NOTE — Telephone Encounter (Signed)
Transition Care Management Unsuccessful Follow-up Telephone Call ? ?Date of discharge and from where:  [place] 11/19/21 ? ?Attempts:  1st Attempt ? ?Reason for unsuccessful TCM follow-up call:  Left voice message ? ? ? ?

## 2021-11-21 ENCOUNTER — Telehealth: Payer: Self-pay

## 2021-11-21 NOTE — Telephone Encounter (Signed)
Noted thanks- does this qualify for TCM - since visit is over 2 weeks after discharge?

## 2021-11-21 NOTE — Telephone Encounter (Signed)
Transition Care Management Follow-up Telephone Call Date of discharge and from where: [facility] 11/19/21 How have you been since you were released from the hospital? Alive and breathing  Any questions or concerns? No  Items Reviewed: Did the pt receive and understand the discharge instructions provided? Yes  Medications obtained and verified? Yes  Other? No  Any new allergies since your discharge? No  Dietary orders reviewed? Yes Do you have support at home? Yes   Home Care and Equipment/Supplies: Were home health services ordered? not applicable If so, what is the name of the agency?   Has the agency set up a time to come to the patient's home? not applicable Were any new equipment or medical supplies ordered?  No What is the name of the medical supply agency?  Were you able to get the supplies/equipment? not applicable Do you have any questions related to the use of the equipment or supplies? No  Functional Questionnaire: (I = Independent and D = Dependent) ADLs: I  Bathing/Dressing- I  Meal Prep- I  Eating- I  Maintaining continence- I  Transferring/Ambulation- I  Managing Meds- I  Follow up appointments reviewed:  PCP Hospital f/u appt confirmed? Yes  Scheduled to see Dr Yong Channel  on 12/05/21 @ 9:00. [facility] f/u appt confirmed? No   Are transportation arrangements needed? No  If their condition worsens, is the pt aware to call PCP or go to the Emergency Dept.? Yes Was the patient provided with contact information for the PCP's office or ED? Yes Was to pt encouraged to call back with questions or concerns? Yes

## 2021-11-22 NOTE — Telephone Encounter (Signed)
Vml for pt to call back and sch for Monday 5/22 at 420pm if still avl when he calls-

## 2021-11-22 NOTE — Telephone Encounter (Signed)
What about 4:20 PM on Monday?  It appears we currently have an opening-would that work for patient if still open

## 2021-11-26 ENCOUNTER — Ambulatory Visit: Payer: Medicare Other | Admitting: Family Medicine

## 2021-11-26 ENCOUNTER — Telehealth: Payer: Self-pay

## 2021-11-26 NOTE — Telephone Encounter (Signed)
States they received a standard written order for ostomy supplies.   State the order was missing a diagnosis code.    Is requesting code to be added and order faxed again.

## 2021-11-26 NOTE — Telephone Encounter (Signed)
Pt states he needs these supplies and has two bags left.  Explained to pt I would put a high priority flag on this request and send back to PCP team. Pt stated understanding.

## 2021-11-27 NOTE — Telephone Encounter (Signed)
Order has been faxed back with diagnosis code.

## 2021-11-27 NOTE — Telephone Encounter (Signed)
Called and spoke with [employer] and they are re-faxing papers.

## 2021-11-28 ENCOUNTER — Other Ambulatory Visit: Payer: Self-pay | Admitting: *Deleted

## 2021-11-28 DIAGNOSIS — N186 End stage renal disease: Secondary | ICD-10-CM

## 2021-12-03 NOTE — Telephone Encounter (Signed)
[employer] states they did not get everything they needed.   Will refax today (12/03/21).  States they need more information about the diagnoses.

## 2021-12-05 ENCOUNTER — Ambulatory Visit (INDEPENDENT_AMBULATORY_CARE_PROVIDER_SITE_OTHER): Payer: Medicare Other | Admitting: Family Medicine

## 2021-12-05 ENCOUNTER — Other Ambulatory Visit (HOSPITAL_COMMUNITY): Payer: Self-pay | Admitting: Urology

## 2021-12-05 ENCOUNTER — Encounter: Payer: Self-pay | Admitting: Family Medicine

## 2021-12-05 VITALS — BP 120/70 | HR 90 | Temp 97.9°F | Ht 68.0 in | Wt 166.6 lb

## 2021-12-05 DIAGNOSIS — I4891 Unspecified atrial fibrillation: Secondary | ICD-10-CM | POA: Diagnosis not present

## 2021-12-05 DIAGNOSIS — K56609 Unspecified intestinal obstruction, unspecified as to partial versus complete obstruction: Secondary | ICD-10-CM

## 2021-12-05 DIAGNOSIS — N186 End stage renal disease: Secondary | ICD-10-CM | POA: Diagnosis not present

## 2021-12-05 DIAGNOSIS — N1339 Other hydronephrosis: Secondary | ICD-10-CM

## 2021-12-05 DIAGNOSIS — G609 Hereditary and idiopathic neuropathy, unspecified: Secondary | ICD-10-CM

## 2021-12-05 DIAGNOSIS — I951 Orthostatic hypotension: Secondary | ICD-10-CM | POA: Diagnosis not present

## 2021-12-05 MED ORDER — APIXABAN 2.5 MG PO TABS: 2.5000 mg | ORAL_TABLET | Freq: Two times a day (BID) | ORAL | 11 refills | Status: DC

## 2021-12-05 NOTE — Progress Notes (Signed)
Phone [phone] In person visit   Subjective:   Joshua Riley is a 86 y.o. year old very pleasant male patient who presents for/with See problem oriented charting Chief Complaint  Patient presents with   Follow-up    Pt is here to f/u from hosp visit for small bowel obstruction. Pt states he is doing fine.    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     Cyanocobalamin (VITAMIN B 12 PO) Take 1 tablet by mouth daily.     Ferrous Sulfate (IRON PO) Take 1 tablet by mouth daily.     latanoprost (XALATAN) 0.005 % ophthalmic solution Place 1 drop into both eyes at bedtime.     midodrine (PROAMATINE) 10 MG tablet Take 1 tablet (10 mg total) by mouth 3 (three) times daily. Takes 5 mg plus 58m for 15 mg total three times a day- started by nephrology in [place] 270 tablet 3   midodrine (PROAMATINE) 5 MG tablet Take 1 tablet (5 mg total) by mouth 3 (three) times daily with meals. Takes 5 mg plus 149mfor 15 mg total three times a day- started by nephrology in [place] 270 tablet 3   Multiple Vitamins-Minerals (PRORENAL + D PO) Take 1 tablet by mouth daily.     OVER THE COUNTER MEDICATION Take 1 tablet by mouth daily. Caltrate PO     pregabalin (LYRICA) 50 MG capsule Take 1 capsule (50 mg total) by mouth daily. (Patient taking differently:  Take 50 mg by mouth at bedtime.) 90 capsule 3   sevelamer carbonate (RENVELA) 800 MG tablet Take 1,600 mg by mouth 3 (three) times daily with meals.     sodium bicarbonate 650 MG tablet Take 650 mg by mouth 2 (two) times daily.     timolol (TIMOPTIC) 0.5 % ophthalmic solution 1 drop daily.     apixaban (ELIQUIS) 2.5 MG TABS tablet Take 1 tablet (2.5 mg total) by mouth 2 (two) times daily. 60 tablet 11   No current facility-administered medications for this visit.     Objective:  BP 120/70   Pulse 90   Temp 97.9 F (36.6 C)   Ht 5' 8"  (1.727 m)   Wt 166 lb 9.6 oz (75.6 kg)   SpO2 100%   BMI 25.33 kg/m  Gen: NAD, resting comfortably CV: Irregularly irregular but regular heart rate no murmurs rubs or gallops Lungs: CTAB no crackles, wheeze, rhonchi Abdomen: soft/nontender/nondistended/normal bowel sounds. No rebound or guarding.  Colostomy bag in place Ext: no edema Skin: warm, dry, mild erythema that still blanches on buttocks    Assessment and Plan   #Small bowel obstruction in patient with history of multiple small bowel obstructions S: [facility] with diffuse abdominal pain associated with nausea and vomiting.  Temperature elevated  into the 99's and heart rate above 100.  Lipase minimally elevated at 70, white count elevated to 15.2 thousand, slight anemia at 11.0 for hemoglobin.  CT abdomen pelvis showed changes consistent with high-grade small bowel obstruction in the distal jejunum with diffuse fecalization of small bowel contents and jejunal dilation-thought related to significant adhesions versus internal hernia-other chronic changes were noted such as prior colectomy and left nephrostomy catheter.  General surgery was consulted.  He was followed with conservative management-NG tube was placed to low wall intermittent suction with improvement of symptoms.  Later NG tube was clamped and patient started on clear liquid diet and tolerated this  and was able to have a bowel movement-diet was further advanced.  Was able to be discharged home within 2 days.  Did have hyperkalemia noted on admission-BMP was recommended at next hemodialysis section  Today reports bowel movements over 3x a day into ostomy bag. Denies nausea or vomiting  Patient has some irritation near rectum intermittently- he thinks related to sitting too long and moisture.    He was maintained on dialysis while inpatient during nephrology consult.  His chronic hypotension remains managed with midodrine 15 mg p.o. 3 times dail A/P: Small bowel obstruction has been relieved thankfully-bowels moving appropriately again.  Prior surgeries likely contributed-if new or recurrent symptoms he knows to seek care.  He is already had follow-up blood work with dialysis and we will hold off  I think he has been experiencing some intermittent pressure ulcers-discussed frequent position changes   # Atrial fibrillation S: New onset with hemodialysis and confirmed by EKG.  With chronic hypotension was not considered a good candidate for rate controlling medication. He actually reports had this issue years ago.   Anticoagulated with Eliquis 2.5 mg per recommendations the patient reports stopping this medication- open to restarting after discussion today  A/P: Rate controlled without medication at this time and as above hesitant to add rate control with orthostatic issues.  Discussed precautions if heart rate were to elevate more substantially  I do think he needs to be on Eliquis and recommended that he restart this-discussed reasoning behind this   #ESRD #Hypotension S: Patient is on dialysis MWF [facility]  Medication: Renvela 800 mg-takes 2 tablets 3 times a day as well as sodium bicarbonate 650 mg twice daily -Reports starting dialysis around 2021.  Not on transplant list. -Does have a tendency towards low blood pressures with dialysis days-takes midodrine 15 mg 3 times a  day  -Left AV graft created 09/25/2021 with [facility] in [employer] after femoral dialysis catheter stopped working December 2022 A/P: ESRD stable on dialysis. Hypotension stable with midodrine. Continue current meds and nephrology follow up   # colostomy bag, urine bag - needs new supplies- our team is working on this  # Idiopathic Neuropathy- started after covid though S: Medication: Lyrica 50 mg started in mid 2022. Mildly helpful A/P: bothers him but overall stable- continue to monitor   # history of ulcerative colitis led to complete colectomy in 2012- has colostomy bag (failed J patch) -also has urostomy as complication of colectomy which led to quicker dialysis per his report (sounds like had CKD IV prior to this). Still urinates from penis as well.    Recommended follow up: Return in about 4 months (around 04/06/2022) for followup or sooner if needed.Schedule b4 you leave. Future Appointments  Date Time Provider [place]  01/02/2022  2:00 PM [facility] 4 - [facility]  01/02/2022  3:00 PM Angelia Mould, MD [facility]  01/21/2022 10:20 AM Marin Olp, MD [facility]    Lab/Order associations: NO LABS   ICD-10-CM   1. New onset atrial fibrillation ([place])  I48.91     2. SBO (small bowel obstruction) ([place])  K56.609     3. ESRD (end stage renal disease) ([place])  N18.6     4. Orthostatic hypotension  I95.1     5. Idiopathic neuropathy  G60.9       Meds ordered this encounter  Medications   apixaban (ELIQUIS) 2.5 MG TABS tablet    Sig: Take 1 tablet (2.5 mg total) by mouth 2 (two) times daily.    Dispense:  60 tablet    Refill:  11    Return precautions advised.  Garret Reddish, MD

## 2021-12-05 NOTE — Patient Instructions (Addendum)
Please let us know the dates that you received your 3rd COVID vaccine, Tdap,SHINGRIX and pneumonia vaccines.  Team is working on supplies  Thrilled you are doing so much better- no changes today other than consistently taking eliquis to lower stroke risk  Recommended follow up: Return in about 4 months (around 04/06/2022) for followup or sooner if needed.Schedule b4 you leave. Can cancel July visit

## 2021-12-06 NOTE — Telephone Encounter (Signed)
Form received, called and lm for pt tcb.

## 2021-12-06 NOTE — Telephone Encounter (Signed)
Pt returned call, form completed and made pt aware it has been faxed back to [employer].

## 2021-12-10 ENCOUNTER — Ambulatory Visit (HOSPITAL_COMMUNITY)
Admission: RE | Admit: 2021-12-10 | Discharge: 2021-12-10 | Disposition: A | Discharge status: Home / Self Care | Payer: Medicare Other | Source: Ambulatory Visit | Attending: Urology | Admitting: Urology

## 2021-12-10 ENCOUNTER — Encounter: Payer: Medicare Other | Admitting: Vascular Surgery

## 2021-12-10 ENCOUNTER — Other Ambulatory Visit (HOSPITAL_COMMUNITY): Payer: Self-pay | Admitting: Urology

## 2021-12-10 ENCOUNTER — Encounter (HOSPITAL_COMMUNITY): Payer: Medicare Other

## 2021-12-10 DIAGNOSIS — N186 End stage renal disease: Secondary | ICD-10-CM | POA: Diagnosis not present

## 2021-12-10 DIAGNOSIS — Z436 Encounter for attention to other artificial openings of urinary tract: Secondary | ICD-10-CM | POA: Diagnosis not present

## 2021-12-10 DIAGNOSIS — K589 Irritable bowel syndrome without diarrhea: Secondary | ICD-10-CM | POA: Insufficient documentation

## 2021-12-10 DIAGNOSIS — Z992 Dependence on renal dialysis: Secondary | ICD-10-CM | POA: Diagnosis not present

## 2021-12-10 DIAGNOSIS — N1339 Other hydronephrosis: Secondary | ICD-10-CM | POA: Insufficient documentation

## 2021-12-10 HISTORY — PX: IR NEPHROSTOMY EXCHANGE LEFT: IMG6069

## 2021-12-10 MED ORDER — LIDOCAINE HCL 1 % IJ SOLN
INTRAMUSCULAR | Status: AC
  Filled 2021-12-10: qty 20

## 2021-12-10 MED ORDER — IOHEXOL 300 MG/ML  SOLN
50.0000 mL | Freq: Once | INTRAMUSCULAR | Status: AC | PRN
  Administered 2021-12-10: 12 mL

## 2021-12-10 MED ORDER — LIDOCAINE HCL 1 % IJ SOLN: INTRAMUSCULAR | Status: DC | PRN

## 2021-12-10 NOTE — Progress Notes (Signed)
Vascular and Interventional Radiology  PRE PROCEDURE H&P  Assessment  Plan:   Mr. Joshua Riley is a 86 y.o. year old male who will undergo LEFT NEPHROSTOMY TUBE EXCHANGE in Interventional Radiology.  The procedure has been fully reviewed with the patient/patient's authorized representative. The risks, benefits and alternatives have been explained, and the patient/patient's authorized representative has consented to the procedure.  HPI: Mr. Joshua Riley is a 86 y.o. year old male who is a poor historian and comorbid significantly for ESRD on MWF HD, Hx of IBD (UC) s/p multiple abdominal surgeries with reported ureteral complication and L PCN placement in [place]. Pt had moved into the [place] to live with his Daughter after his Wife passed away and had transfered his Urological care to Dr. Rexene Alberts, who referred his for LEFT PCN exchange.  Informed consent was obtained, witnessed and placed in the patient's chart.  Allergies:  Allergies  Allergen Reactions   Cephalosporins Rash   Ciprofloxacin Itching and Rash   Baclofen Other (See Comments)    Altered mental status, after accidental overdose Altered mental status, after accidental overdose     Medications:  Current Outpatient Medications on File Prior to Encounter  Medication Sig Dispense Refill   acetaminophen (TYLENOL) 325 MG tablet Take 650 mg by mouth daily as needed for fever or headache (pain).     apixaban (ELIQUIS) 2.5 MG TABS tablet Take 1 tablet (2.5 mg total) by mouth 2 (two) times daily. 60 tablet 11   Cyanocobalamin (VITAMIN B 12 PO) Take 1 tablet by mouth daily.     Ferrous Sulfate (IRON PO) Take 1 tablet by mouth daily.     latanoprost (XALATAN) 0.005 % ophthalmic solution Place 1 drop into both eyes at bedtime.     midodrine (PROAMATINE) 10 MG tablet Take 1 tablet (10 mg total) by mouth 3 (three) times daily. Takes 5 mg plus 66m for 15 mg total three times a day- started by nephrology in [place] 270  tablet 3   midodrine (PROAMATINE) 5 MG tablet Take 1 tablet (5 mg total) by mouth 3 (three) times daily with meals. Takes 5 mg plus 186mfor 15 mg total three times a day- started by nephrology in [place] 270 tablet 3   Multiple Vitamins-Minerals (PRORENAL + D PO) Take 1 tablet by mouth daily.     OVER THE COUNTER MEDICATION Take 1 tablet by mouth daily. Caltrate PO     pregabalin (LYRICA) 50 MG capsule Take 1 capsule (50 mg total) by mouth daily. (Patient taking differently: Take 50 mg by mouth at bedtime.) 90 capsule 3   sevelamer carbonate (RENVELA) 800 MG tablet Take 1,600 mg by mouth 3 (three) times daily with meals.     sodium bicarbonate 650 MG tablet Take 650 mg by mouth 2 (two) times daily.     timolol (TIMOPTIC) 0.5 % ophthalmic solution 1 drop daily.     No current facility-administered medications on file prior to encounter.    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  General: WD, WN male in NAD HEENT: Normocephalic, atraumatic Lungs: Respirations non-labored    Michaelle Birks, MD [facility]iology [facility] Radiology   Pager. [place]

## 2021-12-10 NOTE — Procedures (Signed)
Vascular and Interventional Radiology Procedure Note  Patient: Davontae Prusinski DOB: Jun 28, 1936 Medical Record Number: [id number] Note Date/Time: 12/10/21 2:50 PM   Performing Physician: Michaelle Birks, MD Assistant(s): None  Diagnosis: Routine exchange.  Procedure:  LEFT ANTEROGRADE NEPHROSTOGRAM and NEPHROSTOMY TUBE EXCHANGE  Anesthesia:  None Complications: None Estimated Blood Loss:  0 mL Specimens:  None  Findings:  Successful exchange for a 8 F nephrostomy tube into the left kidney(s).   Plan: Resume previous  care. Follow up for routine nephrostomy tube exchange in 2 month(s).   See detailed procedure note with images in PACS. The patient tolerated the procedure well without incident or complication and was returned to Recovery in stable condition.    Michaelle Birks, MD [facility]ascular [facility] Interventional Radiology [facility] Radiology   Pager. [place]

## 2021-12-20 ENCOUNTER — Telehealth: Payer: Self-pay | Admitting: Family Medicine

## 2021-12-20 NOTE — Telephone Encounter (Signed)
You may write this on rx pad and fax it in and use my stamp

## 2021-12-20 NOTE — Telephone Encounter (Signed)
Rosendo Gros with [employer] ph# [phone] requests a Standard Written Order for [facility] be sent to Fax# [phone]

## 2021-12-23 ENCOUNTER — Telehealth: Payer: Self-pay | Admitting: Family Medicine

## 2021-12-23 MED ORDER — PREGABALIN 50 MG PO CAPS: 50.0000 mg | ORAL_CAPSULE | Freq: Every day | ORAL | 3 refills | Status: DC

## 2021-12-23 NOTE — Addendum Note (Signed)
Addended by: Billey Chang on: 12/23/2021 01:08 PM   Modules accepted: Orders

## 2021-12-23 NOTE — Telephone Encounter (Signed)
Patient needs a refill for pregabalin (LYRICA) 50 MG capsule Patient stated its for neuropathy.   [facility] to [facility] on file-   [address].

## 2021-12-23 NOTE — Telephone Encounter (Signed)
Rx has been faxed to [employer].

## 2022-01-02 ENCOUNTER — Encounter: Payer: Self-pay | Admitting: Vascular Surgery

## 2022-01-02 ENCOUNTER — Ambulatory Visit (INDEPENDENT_AMBULATORY_CARE_PROVIDER_SITE_OTHER): Payer: Medicare Other | Admitting: Vascular Surgery

## 2022-01-02 ENCOUNTER — Ambulatory Visit (HOSPITAL_COMMUNITY)
Admission: RE | Admit: 2022-01-02 | Discharge: 2022-01-02 | Disposition: A | Discharge status: Home / Self Care | Payer: Medicare Other | Source: Ambulatory Visit | Attending: Vascular Surgery | Admitting: Vascular Surgery

## 2022-01-02 VITALS — BP 141/73 | HR 54 | Temp 98.1°F | Resp 20 | Ht 69.0 in | Wt 164.2 lb

## 2022-01-02 DIAGNOSIS — N186 End stage renal disease: Secondary | ICD-10-CM | POA: Diagnosis present

## 2022-01-02 DIAGNOSIS — Z992 Dependence on renal dialysis: Secondary | ICD-10-CM

## 2022-01-02 NOTE — Progress Notes (Signed)
ASSESSMENT & PLAN   END-STAGE RENAL DISEASE: This is a complicated situation and that he has an occluded left thigh graft that was just placed in April and is relying on a right femoral tunneled dialysis catheter.  I am somewhat suspicious that the graft in the thigh may have occluded because of hypotension and certainly we would be reluctant to place any new access in terms of a graft until any issues with this are resolved.  I have recommended that we start with bilateral upper extremity central venogram to be sure that he is not a candidate for having a catheter placed in the chest.  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  I do not think we need to stop his Eliquis for this as he will just need peripheral IVs placed for the study.  We will also try to obtain his records from [facility].  After we can obtain his records I will bring him back for an office visit and get formal arterial Doppler studies of the lower extremities.  Once we have all this information we can come up with a definitive plan.  I think first we need to make sure his blood pressure is not an issue and then we consider either a redo thigh graft on the left or if we are able to move his catheter from the right groin we could consider a new thigh graft on the right which would probably be best.  REASON FOR CONSULT:    End-stage renal disease.  The consult is requested by Dr. Garret Reddish.  HPI:   Joshua Riley is a 86 y.o. male who has just moved here from [place].  He has end-stage renal disease.  He dialyzes Monday Wednesdays and Fridays at [facility].  In April of this year, he had a left thigh AV graft placed in [facility].  We will try to obtain these records but currently I have no information.  According to the patient, he had attempted access in both upper  extremities and was told that he could not have further access in the upper extremities as his "veins were too thin."  He is also had catheters bilaterally in the chest.  These failed to work apparently.  He currently has a right femoral tunneled dialysis catheter which he uses for dialysis.  He denies any recent uremic symptoms.  He is on Eliquis for atrial fibrillation.  Importantly he has had problems with hypotension.  He is on midodrine for his blood pressure.  Past Medical History:  Diagnosis Date   Anemia    Chronic kidney disease    ESRD (end stage renal disease) ([facility]) 10/22/2021   Glaucoma 11/18/2021   History of DVT (deep vein thrombosis)    Hydronephrosis    managed wtih a PCN   Idiopathic neuropathy 10/22/2021   lyrica    Orthostatic hypotension 10/22/2021   Ulcerative colitis ([place])    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    No family history on file.  SOCIAL HISTORY: Social History   Tobacco Use   Smoking status: Former    Types: Cigarettes    Passive exposure: Never   Smokeless tobacco: Never  Substance Use Topics   Alcohol use: Yes    Alcohol/week: 5.0 standard drinks of alcohol    Types:  5 Shots of liquor per week    Allergies  Allergen Reactions   Cephalosporins Rash   Ciprofloxacin Itching and Rash   Baclofen Other (See Comments)    Altered mental status, after accidental overdose Altered mental status, after accidental overdose     Current Outpatient Medications  Medication Sig Dispense Refill   acetaminophen (TYLENOL) 325 MG tablet Take 650 mg by mouth daily as needed for fever or headache (pain).     apixaban (ELIQUIS) 2.5 MG TABS tablet Take 1 tablet (2.5 mg total) by mouth 2 (two) times daily. 60 tablet 11   Cyanocobalamin (VITAMIN B 12 PO) Take 1 tablet by mouth daily.     Ferrous Sulfate (IRON PO) Take 1 tablet by mouth daily.     latanoprost (XALATAN) 0.005 % ophthalmic solution Place 1 drop into both eyes at  bedtime.     midodrine (PROAMATINE) 10 MG tablet Take 1 tablet (10 mg total) by mouth 3 (three) times daily. Takes 5 mg plus 56m for 15 mg total three times a day- started by nephrology in [place] 270 tablet 3   midodrine (PROAMATINE) 5 MG tablet Take 1 tablet (5 mg total) by mouth 3 (three) times daily with meals. Takes 5 mg plus 14mfor 15 mg total three times a day- started by nephrology in [place] 270 tablet 3   Multiple Vitamins-Minerals (PRORENAL + D PO) Take 1 tablet by mouth daily.     OVER THE COUNTER MEDICATION Take 1 tablet by mouth daily. Caltrate PO     pregabalin (LYRICA) 50 MG capsule Take 1 capsule (50 mg total) by mouth daily. 90 capsule 3   sevelamer carbonate (RENVELA) 800 MG tablet Take 1,600 mg by mouth 3 (three) times daily with meals.     sodium bicarbonate 650 MG tablet Take 650 mg by mouth 2 (two) times daily.     timolol (TIMOPTIC) 0.5 % ophthalmic solution 1 drop daily.     No current facility-administered medications for this visit.   REVIEW OF SYSTEMS:  [X]  denotes positive finding, [ ]  denotes negative finding Cardiac  Comments:  Chest pain or chest pressure:    Shortness of breath upon exertion: x   Short of breath when lying flat:    Irregular heart rhythm:        Vascular    Pain in calf, thigh, or hip brought on by ambulation:    Pain in feet at night that wakes you up from your sleep:     Blood clot in your veins:    Leg swelling:         Pulmonary    Oxygen at home:    Productive cough:     Wheezing:         Neurologic    Sudden weakness in arms or legs:     Sudden numbness in arms or legs:     Sudden onset of difficulty speaking or slurred speech:    Temporary loss of vision in one eye:     Problems with dizziness:         Gastrointestinal    Blood in stool:     Vomited blood:         Genitourinary    Burning when urinating:     Blood in urine:        Psychiatric    Major depression:         Hematologic    Bleeding problems:     Problems with blood clotting  too easily:        Skin    Rashes or ulcers:        Constitutional    Fever or chills:    -  PHYSICAL EXAM:   Vitals:   01/02/22 1515  BP: (!) 141/73  Pulse: (!) 54  Resp: 20  Temp: 98.1 F (36.7 C)  TempSrc: Temporal  SpO2: 97%  Weight: 164 lb 3.2 oz (74.5 kg)  Height: 5' 9"  (1.753 m)   Body mass index is 24.25 kg/m. GENERAL: The patient is a well-nourished male, in no acute distress. The vital signs are documented above. CARDIAC: There is a regular rate and rhythm.  VASCULAR: I do not detect carotid bruits. He has palpable femoral and posterior tibial pulses bilaterally. I cannot palpate dorsalis pedis pulses. His graft in the left thigh is collapse and chronically occluded. His incision in the left groin is healing well. PULMONARY: There is good air exchange bilaterally without wheezing or rales. ABDOMEN: He has an ostomy in the right lower quadrant from previous bowel resection.  He has a urostomy from the left flank. MUSCULOSKELETAL: There are no major deformities. NEUROLOGIC: No focal weakness or paresthesias are detected. SKIN: There are no ulcers or rashes noted. PSYCHIATRIC: The patient has a normal affect.  DATA:    DUPLEX LEFT AV GRAFT: I have independently interpreted the duplex of his left AV graft.  This is in the left thigh.  There is no flow in the graft which appears to be occluded.  Deitra Mayo [facility]

## 2022-01-06 ENCOUNTER — Other Ambulatory Visit: Payer: Self-pay | Admitting: Family Medicine

## 2022-01-06 MED ORDER — ALBUTEROL SULFATE HFA 108 (90 BASE) MCG/ACT IN AERS: 2.0000 | INHALATION_SPRAY | Freq: Four times a day (QID) | RESPIRATORY_TRACT | 0 refills | Status: DC | PRN

## 2022-01-06 NOTE — Progress Notes (Signed)
Daughter reports SOB after covid- improving but wanted to trial inhaler- I will send in albuterol to trial per request but not sure if this will help

## 2022-01-08 ENCOUNTER — Telehealth: Payer: Self-pay | Admitting: Family Medicine

## 2022-01-08 NOTE — Telephone Encounter (Signed)
Copied from [place] [id number]. Topic: Medicare AWV >> Jan 08, 2022 11:49 AM Devoria Glassing wrote: Reason for CRM: Left message for patient to schedule Annual Wellness Visit.  Please schedule with Nurse Health Advisor Charlott Rakes, RN at [facility]. This appt can be telephone or office visit. Please call [phone] ask for [facility]

## 2022-01-08 NOTE — Telephone Encounter (Signed)
Pt stated he does not want to do this visit.

## 2022-01-15 ENCOUNTER — Ambulatory Visit (INDEPENDENT_AMBULATORY_CARE_PROVIDER_SITE_OTHER): Payer: Medicare Other | Admitting: Family Medicine

## 2022-01-15 ENCOUNTER — Encounter: Payer: Self-pay | Admitting: Family Medicine

## 2022-01-15 VITALS — BP 98/60 | HR 69 | Temp 97.9°F | Ht 69.0 in | Wt 162.6 lb

## 2022-01-15 DIAGNOSIS — T148XXA Other injury of unspecified body region, initial encounter: Secondary | ICD-10-CM

## 2022-01-15 DIAGNOSIS — S0992XA Unspecified injury of nose, initial encounter: Secondary | ICD-10-CM | POA: Diagnosis not present

## 2022-01-15 DIAGNOSIS — L84 Corns and callosities: Secondary | ICD-10-CM

## 2022-01-15 DIAGNOSIS — I4819 Other persistent atrial fibrillation: Secondary | ICD-10-CM | POA: Diagnosis not present

## 2022-01-15 MED ORDER — ELIQUIS 2.5 MG PO TABS: 2.5000 mg | ORAL_TABLET | Freq: Two times a day (BID) | ORAL | 11 refills | Status: DC

## 2022-01-15 NOTE — Patient Instructions (Addendum)
We will call you within two weeks about your referral to dermatology and podiatry . If you do not hear within 2 weeks, give [place] a call.    Sent in eliquis refill under name brand  Recommended follow up: Return for next already scheduled visit or sooner if needed.

## 2022-01-15 NOTE — Progress Notes (Signed)
Phone [phone] In person visit   Subjective:   Joshua Riley is a 86 y.o. year old very pleasant male patient who presents for/with See problem oriented charting Chief Complaint  Patient presents with   spots on skin    Pt c/o spot on bridge of nose and left shin that never heals.   Apixban    Pt states this drug is not on the market yet so he needs eliquis.   Past Medical History-  Patient Active Problem List   Diagnosis Date Noted   Atrial fibrillation, persistent (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     albuterol (VENTOLIN HFA) 108 (90 Base) MCG/ACT inhaler Inhale 2 puffs into the lungs every 6 (six) hours as needed for wheezing or shortness of breath. 1 each 0   Cyanocobalamin (VITAMIN B 12 PO) Take 1 tablet by mouth daily.     ELIQUIS 2.5 MG TABS tablet Take 1 tablet (2.5 mg total) by mouth 2 (two) times daily. 60 tablet 11   Ferrous Sulfate (IRON PO) Take 1 tablet by mouth daily.     latanoprost (XALATAN) 0.005 % ophthalmic solution Place 1 drop into both eyes at bedtime.     midodrine (PROAMATINE) 10 MG tablet Take 1 tablet (10 mg total) by mouth 3 (three) times daily. Takes 5 mg plus 36m for 15 mg total three times a day- started by nephrology in [place] 270 tablet 3   midodrine (PROAMATINE) 5 MG tablet Take 1 tablet (5 mg total) by mouth 3 (three) times daily with meals. Takes 5 mg plus 162mfor 15  mg total three times a day- started by nephrology in [place] 270 tablet 3   Multiple Vitamins-Minerals (PRORENAL + D PO) Take 1 tablet by mouth daily.     OVER THE COUNTER MEDICATION Take 1 tablet by mouth daily. Caltrate PO     pregabalin (LYRICA) 50 MG capsule Take 1 capsule (50 mg total) by mouth daily. 90 capsule 3   sevelamer carbonate (RENVELA) 800 MG tablet Take 1,600 mg by mouth 3 (three) times daily with meals.     sodium bicarbonate 650 MG tablet Take 650 mg by mouth 2 (two) times daily.     timolol (TIMOPTIC) 0.5 % ophthalmic solution 1 drop daily.     No current facility-administered medications for this visit.     Objective:  BP 98/60   Pulse 69   Temp 97.9 F (36.6 C)   Ht 5' 9"  (1.753 m)   Wt 162 lb 9.6 oz (73.8 kg)   SpO2 100%   BMI 24.01 kg/m  Gen: NAD, resting comfortably CV: irregularly irregular no murmurs rubs or gallops Lungs: CTAB no crackles, wheeze, rhonchi Ext: no edema Skin: warm, dry, nonhealing at least 1 x 1 cm wound on left lower leg and 2-3 x 2-3 mm lesion on right nose   Assessment and Plan   #  Skin concerns S: Patient has a nonhealing wound on the right side of nose (at least 6 months)and left shin (for over a year) A/P: with nonhealing wounds we discussed potential skin cancer risk- will place referral to dermatology for those- [facility] has gotten a few of my patients in within 2-3 months (other clinics 6 months)   #Callus on the left foot-history of needing this to be removed by podiatry in the past-has reformed and wants to establish in [place] seen in [place]-referral to podiatry today   # Atrial fibrillation S: Rate controlled without medication Anticoagulated with Eliquis 2.5 mg twice daily (okay on hemodialysis and adjusted given over age 26) -Had seen cardiology in [place] with [facility]-reassuring stress test at that time (seen for preoperative evaluation A/P: He is taking Eliquis but needs a refill  under the name brand Eliquis-when we sent in as apixaban he was told not on market yet-we have corrected this.  Thankfully rate controlled and effectively anticoagulated-continue current medication   Recommended follow up: Return for next already scheduled visit or sooner if needed. Future Appointments  Date Time Provider [place]  04/10/2022  9:20 AM Marin Olp, MD [facility]   Lab/Order associations:   ICD-10-CM   1. Nonhealing nonsurgical wound  T14.8XXA Ambulatory referral to Dermatology    2. Callus  L84 Ambulatory referral to Podiatry    3. Atrial fibrillation, persistent (HCC)  I48.19       Meds ordered this encounter  Medications   ELIQUIS 2.5 MG TABS tablet    Sig: Take 1 tablet (2.5 mg total) by mouth 2 (two) times daily.    Dispense:  60 tablet    Refill:  11    Return precautions advised.  Garret Reddish, MD

## 2022-01-21 ENCOUNTER — Other Ambulatory Visit: Payer: Self-pay

## 2022-01-21 ENCOUNTER — Ambulatory Visit: Payer: Medicare Other | Admitting: Family Medicine

## 2022-01-21 DIAGNOSIS — Z992 Dependence on renal dialysis: Secondary | ICD-10-CM

## 2022-01-27 ENCOUNTER — Ambulatory Visit (INDEPENDENT_AMBULATORY_CARE_PROVIDER_SITE_OTHER): Payer: Medicare Other | Admitting: Podiatry

## 2022-01-27 DIAGNOSIS — B351 Tinea unguium: Secondary | ICD-10-CM | POA: Diagnosis not present

## 2022-01-27 DIAGNOSIS — M79674 Pain in right toe(s): Secondary | ICD-10-CM | POA: Diagnosis not present

## 2022-01-27 DIAGNOSIS — M79675 Pain in left toe(s): Secondary | ICD-10-CM

## 2022-01-27 NOTE — Progress Notes (Signed)
   Chief Complaint  Patient presents with   Callouses    CORN/CALLUS on right plantar and ingrown toenail on left foot that he has had for 2-3 weeks.    SUBJECTIVE Patient presents to office today complaining of elongated, thickened nails that cause pain while ambulating in shoes.  Patient is unable to trim their own nails.  Patient also has developed a symptomatic painful callus to the plantar aspect of the left forefoot.  Patient is here for further evaluation and treatment.  Past Medical History:  Diagnosis Date   Anemia    Chronic kidney disease    ESRD (end stage renal disease) ([place]) 10/22/2021   Glaucoma 11/18/2021   History of DVT (deep vein thrombosis)    Hydronephrosis    managed wtih a PCN   Idiopathic neuropathy 10/22/2021   lyrica    Orthostatic hypotension 10/22/2021   Ulcerative colitis ([place])    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    OBJECTIVE General Patient is awake, alert, and oriented x 3 and in no acute distress. Derm Skin is dry and supple bilateral. Negative open lesions or macerations. Remaining integument unremarkable. Nails are tender, long, thickened and dystrophic with subungual debris, consistent with onychomycosis, 1-5 bilateral. No signs of infection noted.  Hyperkeratotic preulcerative callus lesion noted to the plantar aspect of the left forefoot Vasc  DP and PT pedal pulses palpable bilaterally. Temperature gradient within normal limits.  Neuro Epicritic and protective threshold sensation grossly intact bilaterally.  Musculoskeletal Exam No symptomatic pedal deformities noted bilateral. Muscular strength within normal limits.  ASSESSMENT 1.  Pain due to onychomycosis of toenails both 2.  Symptomatic callus plantar aspect of the left forefoot  PLAN OF CARE 1. Patient evaluated today.  2. Instructed to maintain good pedal hygiene and foot care.  3. Mechanical debridement of nails 1-5 bilaterally performed using a nail nipper.  Filed with dremel without incident.  4.  Excisional debridement of the hyperkeratotic callus tissue was performed using a 312 scalpel without incident or bleeding.   5.  Return to clinic in 3 mos.    Edrick Kins, DPM [facility]  Dr. Edrick Kins, DPM    [address] [place],  [address]                Office [phone]  Fax [phone]

## 2022-01-29 ENCOUNTER — Other Ambulatory Visit (HOSPITAL_COMMUNITY): Payer: Medicare Other

## 2022-02-04 ENCOUNTER — Ambulatory Visit (HOSPITAL_COMMUNITY)
Admission: RE | Admit: 2022-02-04 | Discharge: 2022-02-04 | Disposition: A | Discharge status: Home / Self Care | Payer: Medicare Other | Source: Ambulatory Visit | Attending: Vascular Surgery | Admitting: Vascular Surgery

## 2022-02-04 DIAGNOSIS — N186 End stage renal disease: Secondary | ICD-10-CM | POA: Diagnosis not present

## 2022-02-04 DIAGNOSIS — Z992 Dependence on renal dialysis: Secondary | ICD-10-CM | POA: Diagnosis present

## 2022-02-04 MED ORDER — IOHEXOL 350 MG/ML SOLN
100.0000 mL | Freq: Once | INTRAVENOUS | Status: AC | PRN
  Administered 2022-02-04: 100 mL via INTRAVENOUS

## 2022-03-27 ENCOUNTER — Encounter: Payer: Self-pay | Admitting: Vascular Surgery

## 2022-03-27 ENCOUNTER — Ambulatory Visit (INDEPENDENT_AMBULATORY_CARE_PROVIDER_SITE_OTHER): Payer: Medicare Other | Admitting: Vascular Surgery

## 2022-03-27 VITALS — BP 120/76 | HR 57 | Temp 98.3°F | Resp 20 | Wt 166.0 lb

## 2022-03-27 DIAGNOSIS — N186 End stage renal disease: Secondary | ICD-10-CM

## 2022-03-27 DIAGNOSIS — Z992 Dependence on renal dialysis: Secondary | ICD-10-CM

## 2022-03-27 NOTE — Progress Notes (Signed)
REASON FOR VISIT:   To discuss hemodialysis access.  MEDICAL ISSUES:   END-STAGE RENAL DISEASE: This patient had a left thigh AV graft placed elsewhere which is chronically occluded.  He has a right femoral tunneled dialysis catheter in place which was just recently changed.  His situation is further complicated and that he is undergone total proctocolectomy and ileostomy.  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  His CT venogram shows significant disease in the external and common iliac vein on the right so is not a candidate for a thigh graft on the right.  Previous studies done elsewhere in [place] show that both SVC's are occluded.  Thus I think his only remaining option to consider new access would be a redo left thigh AV graft.  Certainly this is associated with slightly higher risk especially given his multiple comorbidities however think this is better than a long-term tunneled dialysis catheter.  He has multiple appointments scheduled and so the first available spot will be May 06, 2022.  This is a nondialysis day.  He dialyzes on Monday Wednesdays Fridays.  We will have to stop his Eliquis for 48 hours prior to the procedure.   HPI:   Joshua Riley is a pleasant 86 y.o. male who I saw on 01/02/2022 to evaluate for new access.  He had moved here from [place].  He dialyzes on Monday Wednesdays and Fridays.  He had a left thigh AV graft placed in [facility].  He did obtain the records for me that showed that his upper extremity veins are occluded.  He has bilateral SVC occlusions.  He has a right femoral tunneled dialysis catheter which was recently changed.  He is on Eliquis for atrial fibrillation.  He has a complicated history and that he is undergone proctocolectomy and has an ileostomy.  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  Past Medical History:  Diagnosis Date   Anemia    Chronic kidney disease    ESRD (end stage renal  disease) ([place]) 10/22/2021   Glaucoma 11/18/2021   History of DVT (deep vein thrombosis)    Hydronephrosis    managed wtih a PCN   Idiopathic neuropathy 10/22/2021   lyrica    Orthostatic hypotension 10/22/2021   Ulcerative colitis ([place])    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 No pertinent family history.  SOCIAL HISTORY: Social History   Tobacco Use   Smoking status: Former    Types: Cigarettes    Passive exposure: Never   Smokeless tobacco: Never  Substance Use Topics   Alcohol use: Yes    Alcohol/week: 5.0 standard drinks of alcohol    Types: 5 Shots of liquor per week    Allergies  Allergen Reactions   Cephalosporins Rash   Ciprofloxacin Itching and Rash   Baclofen Other (See Comments)    Altered mental status, after accidental overdose Altered mental status, after accidental overdose     Current Outpatient Medications  Medication Sig Dispense Refill   acetaminophen (TYLENOL) 325 MG tablet Take 650 mg by mouth daily as needed for fever or headache (pain).     Cyanocobalamin (VITAMIN B 12 PO) Take 1 tablet by mouth daily.     ELIQUIS 2.5 MG TABS tablet Take 1 tablet (2.5 mg total) by mouth 2 (two) times daily. 60 tablet 11   Ferrous Sulfate (IRON PO) Take 1 tablet by mouth daily.  midodrine (PROAMATINE) 10 MG tablet Take 1 tablet (10 mg total) by mouth 3 (three) times daily. Takes 5 mg plus 12m for 15 mg total three times a day- started by nephrology in [place] 270 tablet 3   midodrine (PROAMATINE) 5 MG tablet Take 1 tablet (5 mg total) by mouth 3 (three) times daily with meals. Takes 5 mg plus 144mfor 15 mg total three times a day- started by nephrology in [place] 270 tablet 3   Multiple Vitamins-Minerals (PRORENAL + D PO) Take 1 tablet by mouth daily.     OVER THE COUNTER MEDICATION Take 1 tablet by mouth daily. Caltrate PO     pregabalin (LYRICA) 50 MG capsule Take 1 capsule (50 mg total) by mouth daily. 90 capsule 3    sevelamer carbonate (RENVELA) 800 MG tablet Take 1,600 mg by mouth 3 (three) times daily with meals.     timolol (TIMOPTIC) 0.5 % ophthalmic solution 1 drop daily.     albuterol (VENTOLIN HFA) 108 (90 Base) MCG/ACT inhaler Inhale 2 puffs into the lungs every 6 (six) hours as needed for wheezing or shortness of breath. (Patient not taking: Reported on 03/27/2022) 1 each 0   latanoprost (XALATAN) 0.005 % ophthalmic solution Place 1 drop into both eyes at bedtime. (Patient not taking: Reported on 03/27/2022)     sodium bicarbonate 650 MG tablet Take 650 mg by mouth 2 (two) times daily. (Patient not taking: Reported on 03/27/2022)     No current facility-administered medications for this visit.    REVIEW OF SYSTEMS:  [X]  denotes positive finding, [ ]  denotes negative finding Cardiac  Comments:  Chest pain or chest pressure:    Shortness of breath upon exertion:    Short of breath when lying flat:    Irregular heart rhythm:        Vascular    Pain in calf, thigh, or hip brought on by ambulation:    Pain in feet at night that wakes you up from your sleep:     Blood clot in your veins:    Leg swelling:         Pulmonary    Oxygen at home:    Productive cough:     Wheezing:         Neurologic    Sudden weakness in arms or legs:     Sudden numbness in arms or legs:     Sudden onset of difficulty speaking or slurred speech:    Temporary loss of vision in one eye:     Problems with dizziness:         Gastrointestinal    Blood in stool:     Vomited blood:         Genitourinary    Burning when urinating:     Blood in urine:        Psychiatric    Major depression:         Hematologic    Bleeding problems:    Problems with blood clotting too easily:        Skin    Rashes or ulcers:        Constitutional    Fever or chills:     PHYSICAL EXAM:   Vitals:   03/27/22 0949  BP: 120/76  Pulse: (!) 57  Resp: 20  Temp: 98.3 F (36.8 C)  SpO2: 97%  Weight: 166 lb (75.3 kg)     GENERAL: The patient is a well-nourished male, in no acute distress.  The vital signs are documented above. CARDIAC: There is a regular rate and rhythm.  VASCULAR: He has a palpable left femoral pulse. He has palpable posterior tibial pulses bilaterally. On the left side he has a biphasic anterior tibial and posterior tibial signal with the Doppler. He has no significant lower extremity swelling. PULMONARY: There is good air exchange bilaterally without wheezing or rales. ABDOMEN: He has an ileostomy.  He has a permanent left nephrostomy. MUSCULOSKELETAL: There are no major deformities or cyanosis. NEUROLOGIC: No focal weakness or paresthesias are detected. SKIN: There are no ulcers or rashes noted. PSYCHIATRIC: The patient has a normal affect.  DATA:    CT VENOGRAM: I have reviewed his CT venogram that was done on 02/04/2022.  This shows no significant abnormality of the inferior vena cava.  The left common iliac, external iliac, femoral, and profundofemoral veins are patent.  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  Deitra Mayo [facility] [phone]

## 2022-03-31 ENCOUNTER — Encounter: Payer: Self-pay | Admitting: *Deleted

## 2022-04-02 ENCOUNTER — Other Ambulatory Visit: Payer: Self-pay

## 2022-04-02 DIAGNOSIS — N186 End stage renal disease: Secondary | ICD-10-CM

## 2022-04-10 ENCOUNTER — Ambulatory Visit: Payer: Medicare Other | Admitting: Family Medicine

## 2022-04-17 ENCOUNTER — Encounter: Payer: Self-pay | Admitting: Family Medicine

## 2022-04-17 ENCOUNTER — Ambulatory Visit (INDEPENDENT_AMBULATORY_CARE_PROVIDER_SITE_OTHER): Payer: Medicare Other | Admitting: Family Medicine

## 2022-04-17 VITALS — BP 110/60 | HR 56 | Temp 98.3°F | Ht 69.0 in | Wt 166.4 lb

## 2022-04-17 DIAGNOSIS — G609 Hereditary and idiopathic neuropathy, unspecified: Secondary | ICD-10-CM

## 2022-04-17 DIAGNOSIS — N186 End stage renal disease: Secondary | ICD-10-CM

## 2022-04-17 DIAGNOSIS — I951 Orthostatic hypotension: Secondary | ICD-10-CM

## 2022-04-17 DIAGNOSIS — I4819 Other persistent atrial fibrillation: Secondary | ICD-10-CM | POA: Diagnosis not present

## 2022-04-17 MED ORDER — CLOTRIMAZOLE 10 MG MT TROC: 10.0000 mg | Freq: Every day | OROMUCOSAL | 0 refills | Status: DC

## 2022-04-17 NOTE — Progress Notes (Signed)
Phone [phone] In person visit   Subjective:   Joshua Riley is a 86 y.o. year old very pleasant male patient who presents for/with See problem oriented charting Chief Complaint  Patient presents with   Follow-up   medication    Pt would like to discuss  side effects eliquis and doxycycline   Past Medical History-  Patient Active Problem List   Diagnosis Date Noted   Atrial fibrillation, persistent ([place]) 11/18/2021    Priority: High   ESRD (end stage renal disease) ([place]) 10/22/2021    Priority: High   Orthostatic hypotension 10/22/2021    Priority: High   Colostomy status ([place]) 10/22/2021    Priority: High   Attention to urostomy ([place]) 10/22/2021    Priority: High   Glaucoma 11/18/2021    Priority: Medium    Idiopathic neuropathy 10/22/2021    Priority: Medium    SBO (small bowel obstruction) ([place]) 11/18/2021    Priority: Low    Medications- reviewed and updated Current Outpatient Medications  Medication Sig Dispense Refill   acetaminophen (TYLENOL) 325 MG tablet Take 650 mg by mouth daily as needed for fever or headache (pain).     clotrimazole (MYCELEX) 10 MG troche Take 1 tablet (10 mg total) by mouth 5 (five) times daily. For thrush 140 Troche 0   Cyanocobalamin (VITAMIN B 12 PO) Take 1 tablet by mouth daily.     ELIQUIS 2.5 MG TABS tablet Take 1 tablet (2.5 mg total) by mouth 2 (two) times daily. 60 tablet 11   Ferrous Sulfate (IRON PO) Take 1 tablet by mouth daily.     latanoprost (XALATAN) 0.005 % ophthalmic solution Place 1 drop into both eyes at bedtime.     midodrine (PROAMATINE) 10 MG tablet Take 1 tablet (10 mg total) by mouth 3 (three) times daily. Takes 5 mg plus 44m for 15 mg total three times a day- started by nephrology in [place] 270 tablet 3   midodrine (PROAMATINE) 5 MG tablet Take 1 tablet (5 mg total) by mouth 3 (three) times daily with meals. Takes 5 mg plus 125mfor 15 mg total three times a day- started by nephrology in [place] 270 tablet 3    Multiple Vitamins-Minerals (PRORENAL + D PO) Take 1 tablet by mouth daily.     OVER THE COUNTER MEDICATION Take 1 tablet by mouth daily. Caltrate PO     pregabalin (LYRICA) 50 MG capsule Take 1 capsule (50 mg total) by mouth daily. 90 capsule 3   sevelamer carbonate (RENVELA) 800 MG tablet Take 1,600 mg by mouth 3 (three) times daily with meals.     timolol (TIMOPTIC) 0.5 % ophthalmic solution 1 drop daily.     No current facility-administered medications for this visit.     Objective:  BP 110/60   Pulse (!) 56   Temp 98.3 F (36.8 C)   Ht 5' 9"  (1.753 m)   Wt 166 lb 6.4 oz (75.5 kg)   BMI 24.57 kg/m  Gen: NAD, resting comfortably CV: mildly bradycardic slightly irregular- no murmurs rubs or gallops Lungs: CTAB no crackles, wheeze, rhonchi Abdomen: urostomy and colostomy in place Ext: no edema Skin: warm, dry    Assessment and Plan   #history of basal cell- recent removals on nose- [facility]  #styes with mouth soreness- started for styes in both eyes about a week ago on doxycycline 10064maily with #30 pills - started with some irritation on the inside of his mouth- small bumps - on  exam has thrush- will treat with clotriamazole troches and should let his eye doctor know  #some shortness of breath post covid has lingered and not improved. Albuterol did not help- will remove from list. Could be more of fatigue- worse after dialysis.   # Atrial fibrillation S: Rate controlled without medicine Anticoagulated with eliquis 2.5 mg BID Patient is not followed by cardiology: he prefers to hold off for now  -consider tsh if we do bloodwork in future A/P: overall stable- continue current medicine   #ESRD #Hypotension S: Patient is on dialysis  Medication: Renvela 800 mg-takes 2 tablets 3 times a day  -Does have a tendency towards low blood pressures with dialysis days-takes midodrine 15 mg 3 times a day-  prescribed by primary care A/P: remains on ESRD and reports  stability with fluid status and labs- hope we get a copy soon.    # Idiopathic Neuropathy- started after covid though S: Medication: Lyrica 50 mg started in mid 2022. Mildly helpful - mainly his feet affected A/P: imperfect control but medicine does help- continue current   #insomnia- mild- can wake up 2-3 am - takes daytime naps -screen time advised against  #revision procedure for av graft planned 05/06/22- he will stop eliquis 2 days ahead of procedure  Recommended follow up: Return in about 4 months (around 08/18/2022) for followup or sooner if needed.Schedule b4 you leave. Future Appointments  Date Time Provider [place]  04/22/2022  9:30 AM [facility] [place]  04/29/2022  1:30 PM Gardiner Barefoot, DPM [facility]    Lab/Order associations:   ICD-10-CM   1. Atrial fibrillation, persistent ([place])  I48.19     2. ESRD (end stage renal disease) ([place])  N18.6     3. Orthostatic hypotension  I95.1     4. Idiopathic neuropathy  G60.9       Meds ordered this encounter  Medications   clotrimazole (MYCELEX) 10 MG troche    Sig: Take 1 tablet (10 mg total) by mouth 5 (five) times daily. For thrush    Dispense:  140 Troche    Refill:  0    Return precautions advised.  Garret Reddish, MD

## 2022-04-17 NOTE — Patient Instructions (Addendum)
on exam has thrush- will treat with clotriamazole troches and should let his eye doctor know- hopefully you can stay on the antibiotic if we treat the thrush  Sign release of information at the check out desk for last set of labs from dialysis so we can have on file  Recommended follow up: Return in about 4 months (around 08/18/2022) for followup or sooner if needed.Schedule b4 you leave.

## 2022-04-22 ENCOUNTER — Other Ambulatory Visit (HOSPITAL_COMMUNITY): Payer: Self-pay | Admitting: Urology

## 2022-04-22 ENCOUNTER — Ambulatory Visit (HOSPITAL_COMMUNITY)
Admission: RE | Admit: 2022-04-22 | Discharge: 2022-04-22 | Disposition: A | Discharge status: Home / Self Care | Payer: Medicare Other | Source: Ambulatory Visit | Attending: Urology | Admitting: Urology

## 2022-04-22 DIAGNOSIS — N1339 Other hydronephrosis: Secondary | ICD-10-CM

## 2022-04-22 DIAGNOSIS — Z436 Encounter for attention to other artificial openings of urinary tract: Secondary | ICD-10-CM | POA: Insufficient documentation

## 2022-04-22 HISTORY — PX: IR NEPHROSTOMY EXCHANGE LEFT: IMG6069

## 2022-04-22 MED ORDER — IOHEXOL 300 MG/ML  SOLN
50.0000 mL | Freq: Once | INTRAMUSCULAR | Status: AC | PRN
  Administered 2022-04-22: 5 mL

## 2022-04-22 MED ORDER — LIDOCAINE HCL 1 % IJ SOLN
INTRAMUSCULAR | Status: AC
  Filled 2022-04-22: qty 20

## 2022-04-23 ENCOUNTER — Other Ambulatory Visit: Payer: Self-pay | Admitting: Family Medicine

## 2022-04-24 ENCOUNTER — Telehealth: Payer: Self-pay | Admitting: Family Medicine

## 2022-04-24 DIAGNOSIS — R0683 Snoring: Secondary | ICD-10-CM

## 2022-04-24 NOTE — Telephone Encounter (Signed)
Patient states he changed his mind and would like to be Referred to a Pulmonologist

## 2022-04-24 NOTE — Telephone Encounter (Signed)
Referral placed.

## 2022-04-28 ENCOUNTER — Telehealth: Payer: Self-pay | Admitting: Family Medicine

## 2022-04-28 NOTE — Telephone Encounter (Signed)
Patient would like a referral/recommendation to a cardiologist.- states he is experiencing SOB not SOB at the moment - states gets worse with dialysis. States his daughter is a Marine scientist and would like him to be seen by cardiologist. Please call patient when referral placed.

## 2022-04-29 ENCOUNTER — Other Ambulatory Visit: Payer: Self-pay

## 2022-04-29 ENCOUNTER — Ambulatory Visit: Payer: Medicare Other | Admitting: Podiatry

## 2022-04-29 DIAGNOSIS — I4819 Other persistent atrial fibrillation: Secondary | ICD-10-CM

## 2022-04-29 NOTE — Telephone Encounter (Signed)
Unable to contact patient on the phone. LMOVM advising patient the reason PCP wants to f/u with nephrology is because symptoms worsen during dialysis, ie. They may need to adjust how much and how fast they are pulling fluid off during treatments

## 2022-04-29 NOTE — Telephone Encounter (Signed)
LMOVM advising patient that cardio referral placed and that Dr. Yong Channel is requesting f/u with nephrology due to issue happening during dialysis

## 2022-04-29 NOTE — Telephone Encounter (Signed)
Referral placed with cardio, I will call patient this morning to let him know that he needs to f/u with [place] if he isnt seen by cardio soon.

## 2022-05-01 NOTE — Addendum Note (Signed)
Addended by: Nicholas Lose on: 05/01/2022 10:19 AM   Modules accepted: Orders

## 2022-05-05 ENCOUNTER — Other Ambulatory Visit: Payer: Self-pay

## 2022-05-05 ENCOUNTER — Encounter (HOSPITAL_COMMUNITY): Payer: Self-pay | Admitting: Vascular Surgery

## 2022-05-05 NOTE — Anesthesia Preprocedure Evaluation (Addendum)
Anesthesia Evaluation  Patient identified by MRN, date of birth, ID band Patient awake    Reviewed: Allergy & Precautions, NPO status , Patient's Chart, lab work & pertinent test results  History of Anesthesia Complications Negative for: history of anesthetic complications  Airway Mallampati: II  TM Distance: >3 FB Neck ROM: Full    Dental  (+) Poor Dentition, Caps, Dental Advisory Given   Pulmonary shortness of breath (when on dialysis), sleep apnea (does not use CPAP) , COPD,  COPD inhaler, former smoker   breath sounds clear to auscultation       Cardiovascular + dysrhythmias Atrial Fibrillation  Rhythm:Irregular Rate:Normal  mitodrine for BP support '22 ECHO: EF 58%, no significant valvular abnormalities '22 Stress: no ischemia   Neuro/Psych negative neurological ROS     GI/Hepatic Neg liver ROS, PUD,,,  Endo/Other    Renal/GU Dialysis and ESRFRenal diseaseK+ 4.9     Musculoskeletal  (+) Arthritis ,    Abdominal   Peds  Hematology  (+) Blood dyscrasia (Hb 9.5), anemia Eliquis   Anesthesia Other Findings   Reproductive/Obstetrics                              Anesthesia Physical Anesthesia Plan  ASA: 3  Anesthesia Plan: General   Post-op Pain Management: Tylenol PO (pre-op)*   Induction: Intravenous  PONV Risk Score and Plan: 2 and Ondansetron and Dexamethasone  Airway Management Planned: LMA  Additional Equipment: None  Intra-op Plan:   Post-operative Plan:   Informed Consent: I have reviewed the patients History and Physical, chart, labs and discussed the procedure including the risks, benefits and alternatives for the proposed anesthesia with the patient or authorized representative who has indicated his/her understanding and acceptance.     Dental advisory given  Plan Discussed with: CRNA and Surgeon  Anesthesia Plan Comments: (PAT note by Karoline Caldwell,  PA-C: Previously followed with cardiology at [facility] for shortness of breath and paroxysmal atrial fibrillation.  Last seen 09/13/2021 for preop evaluation prior to undergoing placement of dialysis catheter.  Per note, "He presents today for preoperative cardiac clearance in preparation for placement of a dialysis catheter with Dr. Dyane Dustman on 09/06/21. Since his last office visit in May 2022, he continues to endorse SOB after receiving hemodialysis that resolves as the day goes on. TTE on 11/07/20 revealed normal LV function with an EF of 58.4% with mitral sclerosis. Pharmacological stress test on 11/28/20 was negative for ischemia. He denies any cardiac symptoms such as chest pain, palpitations, chest heaviness, pedal edema, presyncope. He states he was started on Eliquis a few days ago and was instructed to stop taking the blood thinner 3 days prior to the surgery.Marland KitchenMarland KitchenAssessment and plan: This is a pleasant 86 year old male who las seen in May 2022 for an initial cardiac evaluation. Pharmacological stress test was negative for ischemia at the time and echocardiogram revealed normal LV function. EKG is stable with no acute changes. He can proceed with surgery as planned with an acceptable cardiac risk."  Recent diagnosis of paroxysmal atrial fibrillation during admission May 2023 for small bowel obstruction.  Although this was labeled as a diagnosis, review of records in care everywhere shows that he also had episodes of paroxysmal atrial fibrillation during admission for catheter related sepsis in June 2022 at [facility] in [place].  Patient's atrial fibrillation, as well as his other chronic conditions, is followed by PCP Dr. Garret Reddish.  Last seen  04/17/2022.  His A-fib was noted to be rate controlled without medication and he is continued on Eliquis 2.5 mg twice daily.  He is on midodrine 50 mg 3 times daily for hypotension.  He continues to report some shortness of breath after dialysis.  Discussed  upcoming revision of AV graft and he was instructed to hold Eliquis 2 days prior.  ESRD on HD via right femoral TDC.  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  He has obstructive uropathy with chronic left nephrostomy.  Patient will need day of surgery labs and evaluation.  EKG 11/18/2021: Atrial fibrillation with rapid ventricular response.  Rate 108. Right bundle branch block  TTE 11/06/2020 (Care Everywhere): Findings         Left Ventricle     The left ventricle is normal in size and function with no evidence of     diastolic dysfunction.         Right Ventricle     The right ventricle is normal in size and function.         Left Atrium     The left atrium is normal in size.         Right Atrium     The right atrium is normal in size.         Aortic Valve     Structurally normal aortic valve.     There is trace aortic regurgitation.         Mitral Valve     Mitral sclerosis.     No evidence of mitral regurgitaton.         Tricuspid Valve     The tricuspid valve is structurally normal with normal antegrade flow and     no tricuspid regurgitation.         Pulmonic Valve     Structurally normal pulmonic valve.     Trace pulmonic regurgitation present.         Miscellaneous     The interatrial septum is intact without evidence of atrial septal defect     or patent foramen ovale.     The ventricular septum is intact.     No intracardiac masses, thrombi or vegetations noted.     The aortic root is normal.     Pulmonary arteries appear normal.         Pericardial Effusion     No evidence of pericardial effusion.         Pleural Effusion     There is no pleural effusion.    )         Anesthesia Quick Evaluation

## 2022-05-05 NOTE — Progress Notes (Signed)
PCP - Marin Olp, MD Cardiologist - Currently being referred to one  EKG - 11/18/21  Blood Thinner Instructions: Last dose of Eliquis: 05/03/22  ERAS Protcol - NPO  Anesthesia review: Y  Patient verbally denies any shortness of breath, fever, cough and chest pain during phone call   -------------  SDW INSTRUCTIONS given:  Your procedure is scheduled on 05/06/22.  Report to [facility] Main Entrance "A" at 0530 A.M., and check in at the Admitting office.  Call this number if you have problems the morning of surgery:  [phone]   Remember:  Do not eat or drink after midnight the night before your surgery     Take these medicines the morning of surgery with A SIP OF WATER  midodrine (PROAMATINE) timolol (TIMOPTIC) acetaminophen (TYLENOL)-if needed albuterol (VENTOLIN HFA)-if needed (please bring on the day of surgery)  As of today, STOP taking any Aspirin (unless otherwise instructed by your surgeon) Aleve, Naproxen, Ibuprofen, Motrin, Advil, Goody's, BC's, all herbal medications, fish oil, and all vitamins.                      Do not wear jewelry, make up, or nail polish            Do not wear lotions, powders, perfumes/colognes, or deodorant.            Do not shave 48 hours prior to surgery.  Men may shave face and neck.            Do not bring valuables to the hospital.            [facility] is not responsible for any belongings or valuables.  Do NOT Smoke (Tobacco/Vaping) 24 hours prior to your procedure If you use a CPAP at night, you may bring all equipment for your overnight stay.   Contacts, glasses, dentures or bridgework may not be worn into surgery.      For patients admitted to the hospital, discharge time will be determined by your treatment team.   Patients discharged the day of surgery will not be allowed to drive home, and someone needs to stay with them for 24 hours.    Special instructions:   [place]- Preparing For Surgery  Before  surgery, you can play an important role. Because skin is not sterile, your skin needs to be as free of germs as possible. You can reduce the number of germs on your skin by washing with CHG (chlorahexidine gluconate) Soap before surgery.  CHG is an antiseptic cleaner which kills germs and bonds with the skin to continue killing germs even after washing.    Oral Hygiene is also important to reduce your risk of infection.  Remember - BRUSH YOUR TEETH THE MORNING OF SURGERY WITH YOUR REGULAR TOOTHPASTE  Please do not use if you have an allergy to CHG or antibacterial soaps. If your skin becomes reddened/irritated stop using the CHG.  Do not shave (including legs and underarms) for at least 48 hours prior to first CHG shower. It is OK to shave your face.  Please follow these instructions carefully.   Shower the NIGHT BEFORE SURGERY and the MORNING OF SURGERY with DIAL Soap.   Pat yourself dry with a CLEAN TOWEL.  Wear CLEAN PAJAMAS to bed the night before surgery  Place CLEAN SHEETS on your bed the night of your first shower and DO NOT SLEEP WITH PETS.   Day of Surgery: Please shower morning of surgery  Wear Clean/Comfortable clothing the morning of surgery Do not apply any deodorants/lotions.   Remember to brush your teeth WITH YOUR REGULAR TOOTHPASTE.   Questions were answered. Patient verbalized understanding of instructions.

## 2022-05-05 NOTE — Progress Notes (Addendum)
Anesthesia Chart Review:  Previously followed with cardiology at [facility] for shortness of breath and paroxysmal atrial fibrillation.  Last seen 09/13/2021 for preop evaluation prior to undergoing placement of dialysis catheter.  Per note, "He presents today for preoperative cardiac clearance in preparation for placement of a dialysis catheter with Dr. Dyane Dustman on 09/06/21. Since his last office visit in May 2022, he continues to endorse SOB after receiving hemodialysis that resolves as the day goes on. TTE on 11/07/20 revealed normal LV function with an EF of 58.4% with mitral sclerosis. Pharmacological stress test on 11/28/20 was negative for ischemia. He denies any cardiac symptoms such as chest pain, palpitations, chest heaviness, pedal edema, presyncope. He states he was started on Eliquis a few days ago and was instructed to stop taking the blood thinner 3 days prior to the surgery.Marland KitchenMarland KitchenAssessment and plan: This is a pleasant 86 year old male who las seen in May 2022 for an initial cardiac evaluation. Pharmacological stress test was negative for ischemia at the time and echocardiogram revealed normal LV function. EKG is stable with no acute changes. He can proceed with surgery as planned with an acceptable cardiac risk."  Recent diagnosis of paroxysmal atrial fibrillation during admission May 2023 for small bowel obstruction.  Although this was labeled as a diagnosis, review of records in care everywhere shows that he also had episodes of paroxysmal atrial fibrillation during admission for catheter related sepsis in June 2022 at [facility] in [place].  Patient's atrial fibrillation, as well as his other chronic conditions, is followed by PCP Dr. Garret Reddish.  Last seen 04/17/2022.  His A-fib was noted to be rate controlled without medication and he is continued on Eliquis 2.5 mg twice daily.  He is on midodrine 50 mg 3 times daily for hypotension.  He continues to report some shortness of breath after  dialysis.  Discussed upcoming revision of AV graft and he was instructed to hold Eliquis 2 days prior.  ESRD on HD via right femoral TDC.  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  He has obstructive uropathy with chronic left nephrostomy.  Patient will need day of surgery labs and evaluation.  EKG 11/18/2021: Atrial fibrillation with rapid ventricular response.  Rate 108. Right bundle branch block  TTE 11/06/2020 (Care Everywhere): Findings         Left Ventricle     The left ventricle is normal in size and function with no evidence of     diastolic dysfunction.         Right Ventricle     The right ventricle is normal in size and function.         Left Atrium     The left atrium is normal in size.         Right Atrium     The right atrium is normal in size.         Aortic Valve     Structurally normal aortic valve.     There is trace aortic regurgitation.         Mitral Valve     Mitral sclerosis.     No evidence of mitral regurgitaton.         Tricuspid Valve     The tricuspid valve is structurally normal with normal antegrade flow and     no tricuspid regurgitation.         Pulmonic Valve     Structurally normal pulmonic valve.     Trace pulmonic  regurgitation present.         Miscellaneous     The interatrial septum is intact without evidence of atrial septal defect     or patent foramen ovale.     The ventricular septum is intact.     No intracardiac masses, thrombi or vegetations noted.     The aortic root is normal.     Pulmonary arteries appear normal.         Pericardial Effusion     No evidence of pericardial effusion.         Pleural Effusion     There is no pleural effusion.        Wynonia Musty [facility]/Anesthesiology Phone [phone] 05/05/2022 11:56 AM

## 2022-05-06 ENCOUNTER — Ambulatory Visit (HOSPITAL_COMMUNITY): Payer: Medicare Other | Admitting: Physician Assistant

## 2022-05-06 ENCOUNTER — Other Ambulatory Visit: Payer: Self-pay

## 2022-05-06 ENCOUNTER — Encounter (HOSPITAL_COMMUNITY): Payer: Self-pay | Admitting: Vascular Surgery

## 2022-05-06 ENCOUNTER — Observation Stay (HOSPITAL_COMMUNITY)
Admission: RE | Admit: 2022-05-06 | Discharge: 2022-05-07 | Disposition: A | Discharge status: Home / Self Care | Payer: Medicare Other | Attending: Vascular Surgery | Admitting: Vascular Surgery

## 2022-05-06 ENCOUNTER — Encounter (HOSPITAL_COMMUNITY)
Admission: RE | Disposition: A | Discharge status: Home / Self Care | Payer: Self-pay | Source: Home / Self Care | Attending: Vascular Surgery

## 2022-05-06 ENCOUNTER — Ambulatory Visit (HOSPITAL_BASED_OUTPATIENT_CLINIC_OR_DEPARTMENT_OTHER): Payer: Medicare Other | Admitting: Physician Assistant

## 2022-05-06 DIAGNOSIS — Z85828 Personal history of other malignant neoplasm of skin: Secondary | ICD-10-CM | POA: Diagnosis not present

## 2022-05-06 DIAGNOSIS — T82898A Other specified complication of vascular prosthetic devices, implants and grafts, initial encounter: Secondary | ICD-10-CM | POA: Diagnosis not present

## 2022-05-06 DIAGNOSIS — D631 Anemia in chronic kidney disease: Secondary | ICD-10-CM

## 2022-05-06 DIAGNOSIS — Z86718 Personal history of other venous thrombosis and embolism: Secondary | ICD-10-CM | POA: Insufficient documentation

## 2022-05-06 DIAGNOSIS — N185 Chronic kidney disease, stage 5: Secondary | ICD-10-CM | POA: Diagnosis not present

## 2022-05-06 DIAGNOSIS — Z992 Dependence on renal dialysis: Secondary | ICD-10-CM

## 2022-05-06 DIAGNOSIS — J449 Chronic obstructive pulmonary disease, unspecified: Secondary | ICD-10-CM

## 2022-05-06 DIAGNOSIS — Z8616 Personal history of COVID-19: Secondary | ICD-10-CM | POA: Diagnosis not present

## 2022-05-06 DIAGNOSIS — Z87891 Personal history of nicotine dependence: Secondary | ICD-10-CM | POA: Diagnosis not present

## 2022-05-06 DIAGNOSIS — T82590A Other mechanical complication of surgically created arteriovenous fistula, initial encounter: Secondary | ICD-10-CM | POA: Diagnosis not present

## 2022-05-06 DIAGNOSIS — I4891 Unspecified atrial fibrillation: Secondary | ICD-10-CM | POA: Diagnosis not present

## 2022-05-06 DIAGNOSIS — N186 End stage renal disease: Secondary | ICD-10-CM | POA: Diagnosis not present

## 2022-05-06 DIAGNOSIS — Z7901 Long term (current) use of anticoagulants: Secondary | ICD-10-CM | POA: Diagnosis not present

## 2022-05-06 HISTORY — DX: Malignant (primary) neoplasm, unspecified: C80.1

## 2022-05-06 HISTORY — DX: Obstructive and reflux uropathy, unspecified: N13.9

## 2022-05-06 HISTORY — DX: Cardiac arrhythmia, unspecified: I49.9

## 2022-05-06 HISTORY — PX: REVISION OF ARTERIOVENOUS GORETEX GRAFT: SHX6073

## 2022-05-06 HISTORY — DX: COVID-19: U07.1

## 2022-05-06 HISTORY — DX: Unspecified osteoarthritis, unspecified site: M19.90

## 2022-05-06 HISTORY — DX: Sleep apnea, unspecified: G47.30

## 2022-05-06 HISTORY — DX: Dyspnea, unspecified: R06.00

## 2022-05-06 HISTORY — DX: Ileostomy status: Z93.2

## 2022-05-06 LAB — CBC
HCT: 27.1 % — ABNORMAL LOW (ref 39.0–52.0)
Hemoglobin: 8.8 g/dL — ABNORMAL LOW (ref 13.0–17.0)
MCH: 40.4 pg — ABNORMAL HIGH (ref 26.0–34.0)
MCHC: 32.5 g/dL (ref 30.0–36.0)
MCV: 124.3 fL — ABNORMAL HIGH (ref 80.0–100.0)
Platelets: 108 10*3/uL — ABNORMAL LOW (ref 150–400)
RBC: 2.18 MIL/uL — ABNORMAL LOW (ref 4.22–5.81)
RDW: 15 % (ref 11.5–15.5)
WBC: 6 10*3/uL (ref 4.0–10.5)
nRBC: 0 % (ref 0.0–0.2)

## 2022-05-06 LAB — CREATININE, SERUM
Creatinine, Ser: 9.19 mg/dL — ABNORMAL HIGH (ref 0.61–1.24)
GFR, Estimated: 5 mL/min — ABNORMAL LOW (ref >60)

## 2022-05-06 LAB — POCT I-STAT, CHEM 8
BUN: 46 mg/dL — ABNORMAL HIGH (ref 8–23)
Calcium, Ion: 1.21 mmol/L (ref 1.15–1.40)
Chloride: 104 mmol/L (ref 98–111)
Creatinine, Ser: 9.6 mg/dL — ABNORMAL HIGH (ref 0.61–1.24)
Glucose, Bld: 88 mg/dL (ref 70–99)
HCT: 28 % — ABNORMAL LOW (ref 39.0–52.0)
Hemoglobin: 9.5 g/dL — ABNORMAL LOW (ref 13.0–17.0)
Potassium: 4.9 mmol/L (ref 3.5–5.1)
Sodium: 140 mmol/L (ref 135–145)
TCO2: 25 mmol/L (ref 22–32)

## 2022-05-06 SURGERY — REVISION OF ARTERIOVENOUS GORETEX GRAFT
Anesthesia: General | Site: Groin | Laterality: Left

## 2022-05-06 MED ORDER — HEPARIN SODIUM (PORCINE) 5000 UNIT/ML IJ SOLN
5000.0000 [IU] | Freq: Three times a day (TID) | INTRAMUSCULAR | Status: DC
  Administered 2022-05-07: 5000 [IU] via SUBCUTANEOUS
  Filled 2022-05-06: qty 1

## 2022-05-06 MED ORDER — PREGABALIN 25 MG PO CAPS
50.0000 mg | ORAL_CAPSULE | Freq: Every day | ORAL | Status: DC
  Administered 2022-05-06: 50 mg via ORAL
  Filled 2022-05-06: qty 2

## 2022-05-06 MED ORDER — PROPOFOL 500 MG/50ML IV EMUL
INTRAVENOUS | Status: DC | PRN
  Administered 2022-05-06: 50 ug/kg/min via INTRAVENOUS

## 2022-05-06 MED ORDER — VANCOMYCIN HCL IN DEXTROSE 1-5 GM/200ML-% IV SOLN: 1000.0000 mg | INTRAVENOUS | Status: AC

## 2022-05-06 MED ORDER — ONDANSETRON HCL 4 MG/2ML IJ SOLN: 4.0000 mg | Freq: Four times a day (QID) | INTRAMUSCULAR | Status: DC | PRN

## 2022-05-06 MED ORDER — HYDROMORPHONE HCL 1 MG/ML IJ SOLN: 0.5000 mg | INTRAMUSCULAR | Status: DC | PRN

## 2022-05-06 MED ORDER — HEPARIN SODIUM (PORCINE) 1000 UNIT/ML IJ SOLN
INTRAMUSCULAR | Status: DC | PRN
  Administered 2022-05-06: 7000 [IU] via INTRAVENOUS

## 2022-05-06 MED ORDER — SODIUM CHLORIDE 0.9% FLUSH: 3.0000 mL | INTRAVENOUS | Status: DC | PRN

## 2022-05-06 MED ORDER — MIDODRINE HCL 5 MG PO TABS
5.0000 mg | ORAL_TABLET | Freq: Three times a day (TID) | ORAL | Status: DC
  Administered 2022-05-06 – 2022-05-07 (×2): 5 mg via ORAL
  Filled 2022-05-06 (×3): qty 1

## 2022-05-06 MED ORDER — SODIUM CHLORIDE 0.9 % IV SOLN: INTRAVENOUS | Status: DC

## 2022-05-06 MED ORDER — LIDOCAINE HCL (PF) 1 % IJ SOLN
INTRAMUSCULAR | Status: AC
  Filled 2022-05-06: qty 30

## 2022-05-06 MED ORDER — FENTANYL CITRATE (PF) 100 MCG/2ML IJ SOLN
25.0000 ug | INTRAMUSCULAR | Status: DC | PRN
  Administered 2022-05-06: 25 ug via INTRAVENOUS

## 2022-05-06 MED ORDER — SEVELAMER CARBONATE 800 MG PO TABS
2400.0000 mg | ORAL_TABLET | Freq: Three times a day (TID) | ORAL | Status: DC
  Administered 2022-05-06: 2400 mg via ORAL
  Filled 2022-05-06: qty 3

## 2022-05-06 MED ORDER — HEPARIN SODIUM (PORCINE) 1000 UNIT/ML IJ SOLN
INTRAMUSCULAR | Status: AC
  Filled 2022-05-06: qty 10

## 2022-05-06 MED ORDER — PROTAMINE SULFATE 10 MG/ML IV SOLN
INTRAVENOUS | Status: DC | PRN
  Administered 2022-05-06: 40 mg via INTRAVENOUS

## 2022-05-06 MED ORDER — PANTOPRAZOLE SODIUM 40 MG PO TBEC
40.0000 mg | DELAYED_RELEASE_TABLET | Freq: Every day | ORAL | Status: DC
  Administered 2022-05-06: 40 mg via ORAL
  Filled 2022-05-06: qty 1

## 2022-05-06 MED ORDER — ALUM & MAG HYDROXIDE-SIMETH 200-200-20 MG/5ML PO SUSP: 15.0000 mL | ORAL | Status: DC | PRN

## 2022-05-06 MED ORDER — VANCOMYCIN HCL IN DEXTROSE 1-5 GM/200ML-% IV SOLN
INTRAVENOUS | Status: AC
  Administered 2022-05-06: 1000 mg via INTRAVENOUS
  Filled 2022-05-06: qty 200

## 2022-05-06 MED ORDER — HEMOSTATIC AGENTS (NO CHARGE) OPTIME
TOPICAL | Status: DC | PRN
  Administered 2022-05-06: 1 via TOPICAL

## 2022-05-06 MED ORDER — LIDOCAINE-EPINEPHRINE (PF) 1 %-1:200000 IJ SOLN
INTRAMUSCULAR | Status: AC
  Filled 2022-05-06: qty 30

## 2022-05-06 MED ORDER — ACETAMINOPHEN 500 MG PO TABS
ORAL_TABLET | ORAL | Status: AC
  Administered 2022-05-06: 1000 mg via ORAL
  Filled 2022-05-06: qty 2

## 2022-05-06 MED ORDER — HEPARIN 6000 UNIT IRRIGATION SOLUTION
Status: AC
  Filled 2022-05-06: qty 500

## 2022-05-06 MED ORDER — ACETAMINOPHEN 500 MG PO TABS: 1000.0000 mg | ORAL_TABLET | Freq: Once | ORAL | Status: AC

## 2022-05-06 MED ORDER — PHENOL 1.4 % MT LIQD: 1.0000 | OROMUCOSAL | Status: DC | PRN

## 2022-05-06 MED ORDER — LIDOCAINE 2% (20 MG/ML) 5 ML SYRINGE
INTRAMUSCULAR | Status: DC | PRN
  Administered 2022-05-06: 20 mg via INTRAVENOUS

## 2022-05-06 MED ORDER — HEPARIN 6000 UNIT IRRIGATION SOLUTION
Status: DC | PRN
  Administered 2022-05-06: 1

## 2022-05-06 MED ORDER — ACETAMINOPHEN 325 MG PO TABS
650.0000 mg | ORAL_TABLET | Freq: Every day | ORAL | Status: DC | PRN
  Administered 2022-05-06: 650 mg via ORAL
  Filled 2022-05-06 (×2): qty 2

## 2022-05-06 MED ORDER — HYDRALAZINE HCL 20 MG/ML IJ SOLN: 5.0000 mg | INTRAMUSCULAR | Status: DC | PRN

## 2022-05-06 MED ORDER — POTASSIUM CHLORIDE CRYS ER 20 MEQ PO TBCR: 20.0000 meq | EXTENDED_RELEASE_TABLET | Freq: Once | ORAL | Status: DC

## 2022-05-06 MED ORDER — FENTANYL CITRATE (PF) 250 MCG/5ML IJ SOLN
INTRAMUSCULAR | Status: DC | PRN
  Administered 2022-05-06 (×4): 50 ug via INTRAVENOUS

## 2022-05-06 MED ORDER — 0.9 % SODIUM CHLORIDE (POUR BTL) OPTIME
TOPICAL | Status: DC | PRN
  Administered 2022-05-06: 1000 mL

## 2022-05-06 MED ORDER — DOCUSATE SODIUM 100 MG PO CAPS
100.0000 mg | ORAL_CAPSULE | Freq: Two times a day (BID) | ORAL | Status: DC
  Administered 2022-05-06: 100 mg via ORAL
  Filled 2022-05-06: qty 1

## 2022-05-06 MED ORDER — PROTAMINE SULFATE 10 MG/ML IV SOLN
INTRAVENOUS | Status: AC
  Filled 2022-05-06: qty 5

## 2022-05-06 MED ORDER — OXYCODONE-ACETAMINOPHEN 5-325 MG PO TABS
1.0000 | ORAL_TABLET | ORAL | Status: DC | PRN
  Administered 2022-05-07: 1 via ORAL
  Filled 2022-05-06: qty 1

## 2022-05-06 MED ORDER — ONDANSETRON HCL 4 MG/2ML IJ SOLN
INTRAMUSCULAR | Status: DC | PRN
  Administered 2022-05-06: 4 mg via INTRAVENOUS

## 2022-05-06 MED ORDER — SODIUM CHLORIDE 0.9 % IV SOLN: 250.0000 mL | INTRAVENOUS | Status: DC | PRN

## 2022-05-06 MED ORDER — MIDODRINE HCL 5 MG PO TABS
10.0000 mg | ORAL_TABLET | Freq: Three times a day (TID) | ORAL | Status: DC
  Administered 2022-05-06 – 2022-05-07 (×2): 10 mg via ORAL
  Filled 2022-05-06 (×3): qty 2

## 2022-05-06 MED ORDER — CHLORHEXIDINE GLUCONATE 0.12 % MT SOLN
OROMUCOSAL | Status: AC
  Administered 2022-05-06: 15 mL
  Filled 2022-05-06: qty 15

## 2022-05-06 MED ORDER — EPHEDRINE SULFATE (PRESSORS) 50 MG/ML IJ SOLN
INTRAMUSCULAR | Status: DC | PRN
  Administered 2022-05-06 (×2): 10 mg via INTRAVENOUS

## 2022-05-06 MED ORDER — FENTANYL CITRATE (PF) 250 MCG/5ML IJ SOLN
INTRAMUSCULAR | Status: AC
  Filled 2022-05-06: qty 5

## 2022-05-06 MED ORDER — ETOMIDATE 2 MG/ML IV SOLN
INTRAVENOUS | Status: DC | PRN
  Administered 2022-05-06: 20 mg via INTRAVENOUS

## 2022-05-06 MED ORDER — PHENYLEPHRINE HCL-NACL 20-0.9 MG/250ML-% IV SOLN
INTRAVENOUS | Status: DC | PRN
  Administered 2022-05-06: 75 ug/min via INTRAVENOUS

## 2022-05-06 MED ORDER — LIDOCAINE-EPINEPHRINE (PF) 1 %-1:200000 IJ SOLN
INTRAMUSCULAR | Status: DC | PRN
  Administered 2022-05-06: 28 mL

## 2022-05-06 MED ORDER — SODIUM CHLORIDE 0.9% FLUSH
3.0000 mL | Freq: Two times a day (BID) | INTRAVENOUS | Status: DC
  Administered 2022-05-06: 3 mL via INTRAVENOUS

## 2022-05-06 MED ORDER — METOPROLOL TARTRATE 5 MG/5ML IV SOLN: 2.0000 mg | INTRAVENOUS | Status: DC | PRN

## 2022-05-06 MED ORDER — FENTANYL CITRATE (PF) 100 MCG/2ML IJ SOLN
INTRAMUSCULAR | Status: AC
  Filled 2022-05-06: qty 2

## 2022-05-06 MED ORDER — CHLORHEXIDINE GLUCONATE 4 % EX LIQD: 60.0000 mL | Freq: Once | CUTANEOUS | Status: DC

## 2022-05-06 MED ORDER — DEXAMETHASONE SODIUM PHOSPHATE 10 MG/ML IJ SOLN
INTRAMUSCULAR | Status: DC | PRN
  Administered 2022-05-06: 10 mg via INTRAVENOUS

## 2022-05-06 MED ORDER — GUAIFENESIN-DM 100-10 MG/5ML PO SYRP: 15.0000 mL | ORAL_SOLUTION | ORAL | Status: DC | PRN

## 2022-05-06 MED ORDER — LABETALOL HCL 5 MG/ML IV SOLN: 10.0000 mg | INTRAVENOUS | Status: DC | PRN

## 2022-05-06 MED ORDER — VANCOMYCIN HCL 750 MG/150ML IV SOLN: 750.0000 mg | INTRAVENOUS | Status: DC

## 2022-05-06 SURGICAL SUPPLY — 41 items
BAG COUNTER SPONGE SURGICOUNT (BAG) ×1 IMPLANT
CANISTER SUCT 3000ML PPV (MISCELLANEOUS) ×1 IMPLANT
CANISTER WOUNDNEG PRESSURE 500 (CANNISTER) IMPLANT
CANNULA VESSEL 3MM 2 BLNT TIP (CANNULA) ×1 IMPLANT
CLIP VESOCCLUDE MED 6/CT (CLIP) ×1 IMPLANT
CLIP VESOCCLUDE SM WIDE 6/CT (CLIP) ×1 IMPLANT
DERMABOND ADVANCED .7 DNX12 (GAUZE/BANDAGES/DRESSINGS) ×1 IMPLANT
DERMABOND ADVANCED .7 DNX6 (GAUZE/BANDAGES/DRESSINGS) IMPLANT
DRAPE HALF SHEET 40X57 (DRAPES) IMPLANT
DRAPE INCISE IOBAN 66X45 STRL (DRAPES) ×1 IMPLANT
DRESSING PEEL AND PLC PRVNA 13 (GAUZE/BANDAGES/DRESSINGS) IMPLANT
DRSG PEEL AND PLACE PREVENA 13 (GAUZE/BANDAGES/DRESSINGS) ×1
ELECT REM PT RETURN 9FT ADLT (ELECTROSURGICAL) ×1
ELECTRODE REM PT RTRN 9FT ADLT (ELECTROSURGICAL) ×1 IMPLANT
GLOVE BIO SURGEON STRL SZ 6.5 (GLOVE) IMPLANT
GLOVE BIO SURGEON STRL SZ7.5 (GLOVE) ×1 IMPLANT
GLOVE BIOGEL PI IND STRL 7.5 (GLOVE) IMPLANT
GLOVE BIOGEL PI IND STRL 8 (GLOVE) ×1 IMPLANT
GLOVE ECLIPSE 7.0 STRL STRAW (GLOVE) IMPLANT
GLOVE SURG UNDER LTX SZ8 (GLOVE) ×1 IMPLANT
GOWN STRL REUS W/ TWL LRG LVL3 (GOWN DISPOSABLE) ×3 IMPLANT
GOWN STRL REUS W/TWL LRG LVL3 (GOWN DISPOSABLE) ×3
GRAFT GORETEX STRT 4-7X45 (Vascular Products) IMPLANT
HEMOSTAT SNOW SURGICEL 2X4 (HEMOSTASIS) IMPLANT
KIT BASIN OR (CUSTOM PROCEDURE TRAY) ×1 IMPLANT
KIT TURNOVER KIT B (KITS) ×1 IMPLANT
NDL HYPO 25GX1X1/2 BEV (NEEDLE) ×1 IMPLANT
NEEDLE HYPO 25GX1X1/2 BEV (NEEDLE) ×1 IMPLANT
NS IRRIG 1000ML POUR BTL (IV SOLUTION) ×1 IMPLANT
PACK CV ACCESS (CUSTOM PROCEDURE TRAY) ×1 IMPLANT
PAD ARMBOARD 7.5X6 YLW CONV (MISCELLANEOUS) ×2 IMPLANT
SPONGE T-LAP 18X18 ~~LOC~~+RFID (SPONGE) IMPLANT
SUT MNCRL AB 4-0 PS2 18 (SUTURE) ×2 IMPLANT
SUT PROLENE 6 0 BV (SUTURE) ×2 IMPLANT
SUT VIC AB 2-0 CT1 27 (SUTURE) ×2
SUT VIC AB 2-0 CT1 TAPERPNT 27 (SUTURE) IMPLANT
SUT VIC AB 3-0 SH 27 (SUTURE) ×4
SUT VIC AB 3-0 SH 27X BRD (SUTURE) ×2 IMPLANT
TOWEL GREEN STERILE (TOWEL DISPOSABLE) ×1 IMPLANT
UNDERPAD 30X36 HEAVY ABSORB (UNDERPADS AND DIAPERS) ×1 IMPLANT
WATER STERILE IRR 1000ML POUR (IV SOLUTION) ×1 IMPLANT

## 2022-05-06 NOTE — Op Note (Signed)
NAME: Joshua Riley    MRN: [id number] DOB: [birth year]    DATE OF OPERATION: 05/06/2022  PREOP DIAGNOSIS:    End-stage renal disease  POSTOP DIAGNOSIS:    Same  PROCEDURE:    Redo left thigh AV graft (4-7 mm PTFE graft) Placement of a Prevena dressing  SURGEON: Judeth Cornfield. Scot Dock, MD  ASSIST: Karoline Caldwell, PA  ANESTHESIA: General  EBL: Minimal  INDICATIONS:    Joshua Riley is a 86 y.o. male who had a left thigh AV graft placed in [place].  He had studies there which showed that he has central venous occlusions bilaterally.  He has a catheter in his right thigh.  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  His only remaining option for access is a left thigh AV graft.  He has a colostomy and he has a urostomy also.  FINDINGS:   Excellent thrill at the completion of the procedure.  Biphasic posterior tibial signal at the completion of the procedure  TECHNIQUE:   The patient was taken the operating room and the left thigh was prepped and draped in usual sterile fashion.  Previous incision the left groin was opened.  Through dense scar tissue I dissected out the venous limb of the graft which was anastomosed to the left common femoral vein.  I was able to dissected vein proximal to this just beneath the inguinal ligament.  In addition I dissected the common femoral artery which was just downstream from the anastomosis where the artery was soft and had a good pulse.  Using 1 counterincision a 4-7 mm PTFE graft was tunneled in a loop fashion in the thigh.  The patient was heparinized.   The common femoral artery was clamped proximally and distally.  A longitudinal arteriotomy was made.  A segment of the 4 mm in the graft was excised the graft slightly spatulated and sewn end-to-side to the common femoral artery using continuous 6-0 Prolene suture.  The graft then pulled the appropriate length for anastomosis to the  femoral vein.   The femoral vein was clamped proximally and distally and I excised the old venous anastomosis completely.  All prosthetic material was removed.  The venous limb of the graft was widely spatulated and sewn end-to-side to the femoral vein using 2 continuous 6-0 Prolene sutures.  At the completion there was an excellent thrill in the fistula.  Hemostasis was obtained in the wounds.  The counterincision was closed with a deep layer of 3-0 Vicryl and the skin closed with 4-0 Monocryl.  The groin incision was closed with a deep layer of 2-0 Vicryl, the subcutaneous layer with 3-0 Vicryl and the skin closed with 4-0 Monocryl.  A Prevena dressing was applied.  The patient tolerated the procedure well was transferred to the recovery room in stable condition.  All needle and sponge counts were correct.  Given the complexity of the case,  the assistant was necessary in order to expedient the procedure and safely perform the technical aspects of the operation.  The assistant provided traction and countertraction to assist with exposure of the artery and vein.  They also assisted with suture ligation of multiple venous branches. They also assisted with tunneling of the graft.  They played a critical role for both anastomoses.. These skills, especially following the Prolene suture for the anastomosis, could not have been adequately performed by a scrub tech assistant.    Deitra Mayo, MD, FACS  [facility]  DATE OF DICTATION:   05/06/2022

## 2022-05-06 NOTE — Anesthesia Procedure Notes (Signed)
Procedure Name: LMA Insertion Date/Time: 05/06/2022 7:45 AM  Performed by: Minerva Ends, CRNAPre-anesthesia Checklist: Patient identified, Emergency Drugs available, Suction available and Patient being monitored Patient Re-evaluated:Patient Re-evaluated prior to induction Oxygen Delivery Method: Circle system utilized Preoxygenation: Pre-oxygenation with 100% oxygen Induction Type: IV induction Ventilation: Mask ventilation without difficulty LMA: LMA inserted LMA Size: 4.0 Tube type: Oral Number of attempts: 1 Placement Confirmation: positive ETCO2 and breath sounds checked- equal and bilateral Tube secured with: Tape Dental Injury: Teeth and Oropharynx as per pre-operative assessment

## 2022-05-06 NOTE — Progress Notes (Signed)
PHARMACY NOTE:  ANTIMICROBIAL RENAL DOSAGE ADJUSTMENT  Current antimicrobial regimen includes a mismatch between antimicrobial dosage and estimated renal function.  As per policy approved by the Pharmacy & Therapeutics and Medical Executive Committees, the antimicrobial dosage will be adjusted accordingly.  Current antimicrobial dosage:  vancomycin 1g q12 hr   Indication: surgical prophylaxis  Renal Function:  Estimated Creatinine Clearance: 5.8 mL/min (A) (by C-G formula based on SCr of 9.6 mg/dL (H)). [x]      On intermittent HD, scheduled: MWF  []      On CRRT    Antimicrobial dosage has been changed to:  763m post HD   Additional comments: Per team, likely discharge 11/1. Also due for HD 11/1 at 12:00. Patient received 1g vancomycin on 10/31, which will remain therapeutic until after next dialysis session.    Thank you for allowing pharmacy to be a part of this patient's care.  LBenetta Spar PharmD, BCPS, BCCP Clinical Pharmacist  Please check AMION for all [place] numbers After 10:00 PM, call [phone]

## 2022-05-06 NOTE — H&P (Addendum)
ASSESSMENT & PLAN   END-STAGE RENAL DISEASE: This patient has a left thigh AV graft which is chronically occluded.  He has a tunneled dialysis catheter in the right groin.  His only option for a new graft is a redo left thigh AV graft.  The common and external iliac veins on the right are stenosed by CT venogram and his central veins are occluded.  Plan will be to keep him overnight with plans for discharge tomorrow after dialysis.  I have discussed the indications for the procedure and the potential complications with the patient and he is agreeable to proceed.  REASON FOR ADMISSION:    For redo left thigh AV graft  HPI:   Joshua Riley is a 86 y.o. male with a complicated history.  He had moved here from [place].  He dialyzes on Monday Wednesdays and Fridays.  He had a left thigh AV graft placed in [facility].  I obtain the records from [place] and his upper extremity central veins are occluded.  He has a right femoral tunneled dialysis catheter which was changed in the last few months.  He is on Eliquis for atrial fibrillation.  This was stopped 48 hours prior to his procedure.  He has undergone previous proctocolectomy and ileostomy.  He did undergo a CT venogram which showed significant disease in the external and common iliac vein on the right so his only option for new access would be a redo left thigh AV graft.  He presents today for that procedure.  I plan was to keep him overnight and discharge him tomorrow.  Past Medical History:  Diagnosis Date   Anemia    Arthritis    Cancer ([place])    Basal cell   Chronic kidney disease    COVID-19    2021   Dyspnea    Dysrhythmia    Afib-controlled on eliquis   ESRD (end stage renal disease) ([place]) 10/22/2021   Glaucoma 11/18/2021   History of DVT (deep vein thrombosis)    Hydronephrosis    managed wtih a PCN   Idiopathic neuropathy 10/22/2021   lyrica    Ileostomy in place [facility])    Obstructive uropathy    With chronic left  nephrostomy   Orthostatic hypotension 10/22/2021   Sleep apnea    does not need a machine   Ulcerative colitis ([place])    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 No pertinent family history.  SOCIAL HISTORY: Social History   Tobacco Use   Smoking status: Former    Types: Cigarettes    Passive exposure: Never   Smokeless tobacco: Never  Substance Use Topics   Alcohol use: Yes    Alcohol/week: 5.0 standard drinks of alcohol    Types: 5 Shots of liquor per week    Comment: socially    Allergies  Allergen Reactions   Cephalosporins Rash   Ciprofloxacin Itching and Rash   Baclofen Other (See Comments)    Altered mental status, after accidental overdose      Current Facility-Administered Medications  Medication Dose Route Frequency Provider Last Rate Last Admin   0.9 %  sodium chloride infusion   Intravenous Continuous Angelia Mould, MD       chlorhexidine (HIBICLENS) 4 % liquid 4 Application  60 mL Topical Once Angelia Mould, MD       And   [START ON 05/07/2022] chlorhexidine (HIBICLENS) 4 % liquid 4 Application  60 mL  Topical Once Angelia Mould, MD       vancomycin (VANCOCIN) IVPB 1000 mg/200 mL premix  1,000 mg Intravenous 60 min Pre-Op Angelia Mould, MD 200 mL/hr at 05/06/22 0703 1,000 mg at 05/06/22 0703    REVIEW OF SYSTEMS:  [X]  denotes positive finding, [ ]  denotes negative finding Cardiac  Comments:  Chest pain or chest pressure:    Shortness of breath upon exertion:    Short of breath when lying flat:    Irregular heart rhythm:        Vascular    Pain in calf, thigh, or hip brought on by ambulation:    Pain in feet at night that wakes you up from your sleep:     Blood clot in your veins:    Leg swelling:         Pulmonary    Oxygen at home:    Productive cough:     Wheezing:         Neurologic    Sudden weakness in arms or legs:     Sudden numbness in arms or legs:     Sudden  onset of difficulty speaking or slurred speech:    Temporary loss of vision in one eye:     Problems with dizziness:         Gastrointestinal    Blood in stool:     Vomited blood:         Genitourinary    Burning when urinating:     Blood in urine:        Psychiatric    Major depression:         Hematologic    Bleeding problems:    Problems with blood clotting too easily:        Skin    Rashes or ulcers:        Constitutional    Fever or chills:    -  PHYSICAL EXAM:   Vitals:   05/05/22 1248 05/06/22 0556  BP:  132/71  Pulse:  (!) 59  Resp:  17  Temp:  97.9 F (36.6 C)  TempSrc:  Oral  SpO2:  97%  Weight: 75.3 kg 75.3 kg  Height: 5' 9"  (1.753 m) 5' 9"  (1.753 m)   Body mass index is 24.51 kg/m. GENERAL: The patient is a well-nourished male, in no acute distress. The vital signs are documented above. CARDIAC: There is a regular rate and rhythm.  VASCULAR: He has a palpable left femoral pulse. He has palpable posterior tibial pulses bilaterally. On the left side he has a biphasic anterior tibial and posterior tibial signal with the Doppler. He has no significant left lower extremity swelling. PULMONARY: There is good air exchange bilaterally without wheezing or rales. ABDOMEN: Soft and non-tender with normal pitched bowel sounds.  MUSCULOSKELETAL: There are no major deformities. NEUROLOGIC: No focal weakness or paresthesias are detected. SKIN: There are no ulcers or rashes noted. PSYCHIATRIC: The patient has a normal affect.  DATA:    CT VENOGRAM: I have reviewed his CT venogram that was done on 02/04/2022.  This shows no significant abnormality of the inferior vena cava.  The left common iliac, external iliac, femoral, and profundofemoral veins are patent.  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   Deitra Mayo [facility]

## 2022-05-06 NOTE — Anesthesia Postprocedure Evaluation (Signed)
Anesthesia Post Note  Patient: Joshua Riley  Procedure(s) Performed: REDO LEFT THIGH ARTERIOVENOUS 4-7 MM GORETEX GRAFT (Left: Groin)     Patient location during evaluation: PACU Anesthesia Type: General Level of consciousness: awake and alert, patient cooperative and oriented Pain management: pain level controlled Vital Signs Assessment: post-procedure vital signs reviewed and stable Respiratory status: spontaneous breathing, nonlabored ventilation and respiratory function stable Cardiovascular status: blood pressure returned to baseline and stable Postop Assessment: no apparent nausea or vomiting Anesthetic complications: no   No notable events documented.  Last Vitals:  Vitals:   05/06/22 1330 05/06/22 1404  BP: 96/66 91/69  Pulse: 69 75  Resp: 11 15  Temp:    SpO2: 98% 100%    Last Pain:  Vitals:   05/06/22 1145  TempSrc:   PainSc: 0-No pain                 Elayna Tobler,E. Doniqua Saxby

## 2022-05-06 NOTE — Plan of Care (Signed)

## 2022-05-06 NOTE — Progress Notes (Signed)
Contacted by vascular PA this morning with request for pt to receive out-pt HD at pt's clinic tomorrow after d/c. Contacted [facility] and spoke to [place]. Clinic is able to treat pt tomorrow. Pt will need to arrive at 11:40 for 12:00 chair time. Spoke to pt via phone since pt in PACU. Pt agreeable to appt tomorrow at his normal out-pt clinic and agreeable to time. Pt states he plans to drive self. Appt added to pt's AVS and update provided to vascular PA so pt can be d/c in the morning to make out-pt HD appt. Clinic aware pt agreeable to plan and will treat tomorrow.   Melven Sartorius Renal Navigator [phone]

## 2022-05-06 NOTE — Transfer of Care (Signed)
Immediate Anesthesia Transfer of Care Note  Patient: Joshua Riley  Procedure(s) Performed: REDO LEFT THIGH ARTERIOVENOUS 4-7 MM GORETEX GRAFT (Left: Groin)  Patient Location: PACU  Anesthesia Type:General  Level of Consciousness: sedated  Airway & Oxygen Therapy: Patient Spontanous Breathing and Patient connected to nasal cannula oxygen  Post-op Assessment: Report given to RN and Post -op Vital signs reviewed and stable  Post vital signs: Reviewed and stable  Last Vitals:  Vitals Value Taken Time  BP 101/62 05/06/22 1019  Temp 98   Pulse 80 05/06/22 1022  Resp 8 05/06/22 1022  SpO2 100 % 05/06/22 1022  Vitals shown include unvalidated device data.  Last Pain:  Vitals:   05/06/22 0615  TempSrc:   PainSc: 0-No pain         Complications: No notable events documented.

## 2022-05-06 NOTE — Discharge Instructions (Signed)
[facility]  Discharge Instructions  AV Fistula or Graft Surgery for Dialysis Access  Please refer to the following instructions for your post-procedure care. Your surgeon or physician assistant will discuss any changes with you.  Activity  You may drive the day following your surgery, if you are comfortable and no longer taking prescription pain medication. Resume full activity as the soreness in your incision resolves.  Bathing/Showering  You may shower after you go home. Keep your incision dry for 48 hours. Do not soak in a bathtub, hot tub, or swim until the incision heals completely. You may not shower if you have a hemodialysis catheter.  Incision Care  Clean your incision with mild soap and water after 48 hours. Pat the area dry with a clean towel. You do not need a bandage unless otherwise instructed. Do not apply any ointments or creams to your incision. You may have skin glue on your incision. Do not peel it off. It will come off on its own in about one week. Your arm may swell a bit after surgery. To reduce swelling use pillows to elevate your arm so it is above your heart. Your doctor will tell you if you need to lightly wrap your arm with an ACE bandage.  Keep Pravena wound vac on your groin incision until it loses it seal in about 7-10 days.  Once that happens, you can remove and then wash the groin wound with soap and water daily and pat dry. (No tub bath-only shower)  Then put a dry gauze or washcloth in the groin daily to keep this area dry to help prevent wound infection.  Do this daily and as needed.  Do not use Vaseline or neosporin on your incisions.  Only use soap and water on your incisions and then protect and keep dry.   Diet  Resume your normal diet. There are not special food restrictions following this procedure. In order to heal from your surgery, it is CRITICAL to get adequate nutrition. Your body requires vitamins, minerals,  and protein. Vegetables are the best source of vitamins and minerals. Vegetables also provide the perfect balance of protein. Processed food has little nutritional value, so try to avoid this.  Medications  Resume taking all of your medications. If your incision is causing pain, you may take over-the counter pain relievers such as acetaminophen (Tylenol). If you were prescribed a stronger pain medication, please be aware these medications can cause nausea and constipation. Prevent nausea by taking the medication with a snack or meal. Avoid constipation by drinking plenty of fluids and eating foods with high amount of fiber, such as fruits, vegetables, and grains.  Do not take Tylenol if you are taking prescription pain medications.  Follow up Your surgeon may want to see you in the office following your access surgery. If so, this will be arranged at the time of your surgery.  Please call us immediately for any of the following conditions:  Increased pain, redness, drainage (pus) from your incision site Fever of 101 degrees or higher Severe or worsening pain at your incision site Hand pain or numbness.  Reduce your risk of vascular disease:  Stop smoking. If you would like help, call [facility] at 1-800-QUIT-NOW 2265741804) or [place] at [place] your cholesterol Maintain a desired weight Control your diabetes Keep your blood pressure down  Dialysis  It will take several weeks to several months for your new dialysis access to be  ready for use. Your surgeon will determine when it is okay to use it. Your nephrologist will continue to direct your dialysis. You can continue to use your Permcath until your new access is ready for use.   05/06/2022 Joshua Riley [id number] [birth year]  Surgeon(s): Angelia Mould, MD  Procedure(s): REDO LEFT THIGH ARTERIOVENOUS 4-7 MM GORETEX GRAFT   May stick graft immediately   May stick graft on designated area only:    X Do not stick Left AV Graft for 4 weeks    If you have any questions, please call the office at [phone].

## 2022-05-07 ENCOUNTER — Telehealth: Payer: Self-pay | Admitting: Physician Assistant

## 2022-05-07 ENCOUNTER — Encounter (HOSPITAL_COMMUNITY): Payer: Self-pay | Admitting: Vascular Surgery

## 2022-05-07 DIAGNOSIS — N186 End stage renal disease: Secondary | ICD-10-CM | POA: Diagnosis not present

## 2022-05-07 MED ORDER — PROPOFOL 1000 MG/100ML IV EMUL
INTRAVENOUS | Status: AC
  Filled 2022-05-07: qty 100

## 2022-05-07 MED ORDER — OXYCODONE-ACETAMINOPHEN 5-325 MG PO TABS: 1.0000 | ORAL_TABLET | Freq: Four times a day (QID) | ORAL | 0 refills | Status: DC | PRN

## 2022-05-07 NOTE — Progress Notes (Signed)
Pt was d/c this am and pt to receive out-pt HD at [facility] today. Contacted clinic and spoke to [place] who is aware pt will be at clinic later this morning for HD appt.   Melven Sartorius Renal Navigator [phone]

## 2022-05-07 NOTE — Progress Notes (Signed)
Mobility Specialist Progress Note:   05/07/22 0846  Mobility  Activity Ambulated with assistance in hallway  Level of Assistance Contact guard assist, steadying assist  Assistive Device None  Distance Ambulated (ft) 250 ft  Activity Response Tolerated well  $Mobility charge 1 Mobility   Pt received in bed willing to participate in mobility. No complaints of pain. Left in bed with call bell in reach and all needs met.   During Mobility:128 HR Post Mobility: 120  HR  Joshua Riley Air traffic controller only

## 2022-05-07 NOTE — Discharge Summary (Signed)
Discharge Summary    Joshua Riley March 27, 1936 86 y.o. male  [id number]  Admission Date: 05/06/2022  Discharge Date: 05/08/2022  Physician: No att. providers found  Admission Diagnosis: ESRD (end stage renal disease) on dialysis ([place]) [N18.6, Z99.2]   HPI:   This is a 86 y.o. male  with a complicated history.  He had moved here from [place].  He dialyzes on Monday Wednesdays and Fridays.  He had a left thigh AV graft placed in [facility].  I obtain the records from [place] and his upper extremity central veins are occluded.  He has a right femoral tunneled dialysis catheter which was changed in the last few months.  He is on Eliquis for atrial fibrillation.  This was stopped 48 hours prior to his procedure.  He has undergone previous proctocolectomy and ileostomy.  He did undergo a CT venogram which showed significant disease in the external and common iliac vein on the right so his only option for new access would be a redo left thigh AV graft.  He presents today for that procedure.  I plan was to keep him overnight and discharge him tomorrow.   Hospital Course:  The patient was admitted to the hospital and taken to the operating room on 05/06/2022 and underwent: Redo left thigh AV graft (4-7 mm PTFE graft) Placement of a Prevena dressing    Findings: Excellent thrill at the completion of the procedure.  Biphasic posterior tibial signal at the completion of the procedure   The pt tolerated the procedure well and was transported to the PACU in good condition.   POD 1, pt was doing well.  He had doppler flow left foot.  Prevena had a good seal.  Dr. Scot Dock discussed with pt that this is his last option for access and leaving the prevena vac in place given his urostomy and colostomy will give him the best chance for healing and hopefully preventing any wound infection.    Pt is discharged home to make his outpatient HD appt.   Pt's TDC was placed in [place].    CBC     Component Value Date/Time   WBC 6.0 05/06/2022 1604   RBC 2.18 (L) 05/06/2022 1604   HGB 8.8 (L) 05/06/2022 1604   HCT 27.1 (L) 05/06/2022 1604   PLT 108 (L) 05/06/2022 1604   MCV 124.3 (H) 05/06/2022 1604   MCH 40.4 (H) 05/06/2022 1604   MCHC 32.5 05/06/2022 1604   RDW 15.0 05/06/2022 1604    BMET    Component Value Date/Time   NA 140 05/06/2022 0621   K 4.9 05/06/2022 0621   CL 104 05/06/2022 0621   CO2 29 11/19/2021 0044   GLUCOSE 88 05/06/2022 0621   BUN 46 (H) 05/06/2022 0621   CREATININE 9.19 (H) 05/06/2022 1604   CALCIUM 9.2 11/19/2021 0044   GFRNONAA 5 (L) 05/06/2022 1604      Discharge Instructions     Discharge patient   Complete by: As directed    Discharge disposition: 01-Home or Self Care   Discharge patient date: 05/07/2022       Discharge Diagnosis:  ESRD (end stage renal disease) on dialysis ([place]) [N18.6, Z99.2]  Secondary Diagnosis: Patient Active Problem List   Diagnosis Date Noted   ESRD (end stage renal disease) on dialysis ([place]) 05/06/2022   SBO (small bowel obstruction) ([place]) 11/18/2021   Glaucoma 11/18/2021   Atrial fibrillation, persistent ([place]) 11/18/2021   ESRD (end stage renal disease) ([place]) 10/22/2021   Orthostatic hypotension  10/22/2021   Idiopathic neuropathy 10/22/2021   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   albuterol 108 (90 Base) MCG/ACT inhaler Commonly known as: VENTOLIN HFA TAKE 2 PUFFS BY MOUTH EVERY 6 HOURS AS NEEDED FOR WHEEZE OR SHORTNESS OF BREATH   clotrimazole 10 MG troche Commonly known as: MYCELEX Take 1 tablet (10 mg total) by mouth 5 (five) times daily. For thrush   Eliquis 2.5 MG Tabs tablet Generic drug: apixaban Take 1 tablet (2.5 mg total) by mouth 2 (two) times daily.   latanoprost 0.005 % ophthalmic solution Commonly known as: XALATAN Place 1 drop into the right eye at bedtime.   midodrine 10 MG tablet Commonly known as: PROAMATINE Take 1 tablet (10 mg total) by mouth 3 (three) times daily. Takes 5 mg plus 53m for 15 mg total three times a day- started by nephrology in [place]   midodrine 5 MG tablet Commonly known as: PROAMATINE Take 1 tablet (5 mg total) by mouth 3 (three) times daily with meals. Takes 5 mg plus 142mfor 15 mg total three times a day- started by nephrology in [place]   Omega 3 1000 MG Caps Take by mouth.   OVER THE COUNTER MEDICATION Take 1 tablet by mouth daily. Bio Smooth   OVER THE COUNTER MEDICATION Take 1 capsule by mouth daily. Kidney Cop   oxyCODONE-acetaminophen 5-325 MG tablet Commonly known as: Percocet Take 1 tablet by mouth every 6 (six) hours as needed for severe pain.   pregabalin 50 MG capsule Commonly known as: LYRICA Take 1 capsule (50 mg total) by mouth daily.   sevelamer carbonate 800 MG tablet Commonly known as: RENVELA Take 2,400 mg by mouth 3 (three) times daily with  meals.   timolol 0.5 % ophthalmic solution Commonly known as: TIMOPTIC Place 1 drop into both eyes daily.   VITAMIN B 12 PO Take 1 tablet by mouth daily.         Instructions: [facility] AV Fistula or Graft Surgery for Dialysis Access  Please refer to the following instructions for your post-procedure care. Your surgeon or physician assistant will discuss any changes with  you.  Activity  You may drive the day following your surgery, if you are comfortable and no longer taking prescription pain medication. Resume full activity as the soreness in your incision resolves.  Bathing/Showering  You may shower after you go home. Keep your incision dry for 48 hours. Do not soak in a bathtub, hot tub, or swim until the incision heals completely. You may not shower if you have a hemodialysis catheter.  Incision Care  Clean your incision with mild soap and water after 48 hours. Pat the area dry with a clean towel. You do not need a bandage unless otherwise instructed. Do not apply any ointments or creams to your incision. You may have skin glue on your incision. Do not peel it off. It will come off on its own in about one week. Your arm may swell a bit after surgery. To reduce swelling use pillows to elevate your arm so it is above your heart. Your doctor will tell you if you need to lightly wrap your arm with an ACE bandage.  Keep Pravena wound vac on your groin incision until it loses it seal in about 7-10 days.  Once that happens, you can remove and then wash the groin wound with soap and water daily and pat dry. (No tub bath-only shower)  Then put a dry gauze or washcloth in the groin daily to keep this area dry to help prevent wound infection.  Do this daily and as needed.  Do not use Vaseline or neosporin on your incisions.  Only use soap and water on your incisions and then protect and keep dry.   Diet  Resume your normal diet.  There are not special food restrictions following this procedure. In order to heal from your surgery, it is CRITICAL to get adequate nutrition. Your body requires vitamins, minerals, and protein. Vegetables are the best source of vitamins and minerals. Vegetables also provide the perfect balance of protein. Processed food has little nutritional value, so try to avoid this.  Medications  Resume taking all of your medications. If your incision is causing pain, you may take over-the counter pain relievers such as acetaminophen (Tylenol). If you were prescribed a stronger pain medication, please be aware these medications can cause nausea and constipation. Prevent nausea by taking the medication with a snack or meal. Avoid constipation by drinking plenty of fluids and eating foods with high amount of fiber, such as fruits, vegetables, and grains. Do not take Tylenol if you are taking prescription pain medications.  Follow up Your surgeon may want to see you in the office following your access surgery. If so, this will be arranged at the time of your surgery.  Please call us immediately for any of the following conditions:  Increased pain, redness, drainage (pus) from your incision site Fever of 101 degrees or higher Severe or worsening pain at your incision site Hand pain or numbness.  Reduce your risk of vascular disease:  Stop smoking. If you would like help, call [facility] at 1-800-QUIT-NOW 860-043-4157) or [place] at [place] your cholesterol Maintain a desired weight Control your diabetes Keep your blood pressure down  Dialysis  It will take several weeks to several months for your new dialysis access to be ready for use. Your surgeon will determine when it is OK to use it. Your nephrologist will continue to direct your dialysis. You can continue to use your Permcath until your new access is ready for use.   05/08/2022 Percell Miller  [place] [id number] 12-19-1935  Surgeon(s): Angelia Mould, MD  Procedure(s): REDO LEFT THIGH ARTERIOVENOUS 4-7 MM GORETEX GRAFT  x Do not stick graft for 4 weeks    If you have any questions, please call the office at [phone].  Prescriptions given: Roxicet #15 No Refill  Disposition: home  Patient's condition: is Good  Follow up: 1. [facility] in 2 weeks for wound check on Dr. Scot Dock clinic day.   Leontine Locket, PA-C [facility] [phone] 05/08/2022  7:24 AM

## 2022-05-07 NOTE — Progress Notes (Signed)
  Progress Note    05/07/2022 6:51 AM 1 Day Post-Op  Subjective:  needs to be discharged by 10am to make dialysis.  Says he has a little soreness in his groin.  Afebrile HR 60's-100's  34'V-425'Z systolic 56% RA  Vitals:   05/07/22 0058 05/07/22 0448  BP: (!) 87/59 (!) 87/59  Pulse: 65 65  Resp: 20 20  Temp: (!) 97.3 F (36.3 C) (!) 97.3 F (36.3 C)  SpO2:  97%    Physical Exam: Cardiac:  regular Lungs:  non labored Incisions:  left groin with prevena vac in place Extremities:  +doppler signals left PT and peroneal; +thrill left thigh AVG  CBC    Component Value Date/Time   WBC 6.0 05/06/2022 1604   RBC 2.18 (L) 05/06/2022 1604   HGB 8.8 (L) 05/06/2022 1604   HCT 27.1 (L) 05/06/2022 1604   PLT 108 (L) 05/06/2022 1604   MCV 124.3 (H) 05/06/2022 1604   MCH 40.4 (H) 05/06/2022 1604   MCHC 32.5 05/06/2022 1604   RDW 15.0 05/06/2022 1604    BMET    Component Value Date/Time   NA 140 05/06/2022 0621   K 4.9 05/06/2022 0621   CL 104 05/06/2022 0621   CO2 29 11/19/2021 0044   GLUCOSE 88 05/06/2022 0621   BUN 46 (H) 05/06/2022 0621   CREATININE 9.19 (H) 05/06/2022 1604   CALCIUM 9.2 11/19/2021 0044   GFRNONAA 5 (L) 05/06/2022 1604    INR No results found for: "INR"   Intake/Output Summary (Last 24 hours) at 05/07/2022 0651 Last data filed at 05/06/2022 1500 Gross per 24 hour  Intake 620 ml  Output 50 ml  Net 570 ml     Assessment/Plan:  86 y.o. male is s/p:  Redo left thigh AV graft (4-7 mm PTFE graft) Placement of a Prevena dressing  1 Day Post-Op   Pt with + doppler signals left PT/pero and graft has good thrill -pt with Prevena vac left groin with good seal-will need to be connected to Prevena vac prior to discharge.  -f/u in 2 weeks on a Thursday when Dr. Scot Dock is in the office for wound check.    Leontine Locket, PA-C [facility] [phone] 05/07/2022 6:51 AM

## 2022-05-07 NOTE — Telephone Encounter (Signed)
-----   Message from Joshua Riley, [place] sent at 05/06/2022 10:17 AM EDT ----- S/p left thigh AV graft by Dr. Scot Dock. He needs 2-3 week post op visit for incisional check. thanks

## 2022-05-19 NOTE — Progress Notes (Unsigned)
Cardiology Office Note:    Date:  05/19/2022   ID:  Joshua Riley, DOB February 16, 1936, MRN [id number]  PCP:  Marin Olp, MD   [facility] Providers Cardiologist:  None {    Referring MD: Marin Olp, MD    History of Present Illness:    Joshua Riley is a 86 y.o. male with a hx of DVT, ulcerative colitis, ESRD on HD, hypotension on midodrine, and persistent Afib on apixaban who was referred by Dr. Yong Channel for further evaluation of Afib.  Patient was seen by Dr. Yong Channel on 04/2022. Has known history of persistent Afib for which he has been maintained on eliquis. Not on nodal agents due to hypotension with HD requiring midodrine. He is now referred to Cardiology for further evaluation.  Per review of prior Cardiology records, has history of stress test in 11/2020 which was negative for ischemia. TTE with normal LVEF with "mitral sclerosis."  Today, ***    Past Medical History:  Diagnosis Date   A-fib ([place])    Anemia    Arthritis    Cancer ([place])    Basal cell   Chronic kidney disease    COVID-19    2021   Dyspnea    Dysrhythmia    Afib-controlled on eliquis   ESRD (end stage renal disease) ([place]) 10/22/2021   Glaucoma 11/18/2021   History of DVT (deep vein thrombosis)    Hydronephrosis    managed wtih a PCN   Idiopathic neuropathy 10/22/2021   lyrica    Ileostomy in place [facility])    Obstructive uropathy    With chronic left nephrostomy   Orthostatic hypotension 10/22/2021   Sleep apnea    does not need a machine   Ulcerative colitis ([place])    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    Past Surgical History:  Procedure Laterality Date   BASAL CELL CARCINOMA EXCISION     10/23   COLON SURGERY     creation of j pouch     and subsequent takedown of j pouch   EYE SURGERY     IR NEPHROSTOMY EXCHANGE LEFT  12/10/2021   IR NEPHROSTOMY EXCHANGE LEFT  04/22/2022   REVISION OF ARTERIOVENOUS GORETEX GRAFT Left 05/06/2022    Procedure: REDO LEFT THIGH ARTERIOVENOUS 4-7 MM GORETEX GRAFT;  Surgeon: Angelia Mould, MD;  [facility]: [facility];  Service: Vascular;  Laterality: Left;   SMALL INTESTINE SURGERY     TOTAL COLECTOMY      Current Medications: No outpatient medications have been marked as taking for the 05/22/22 encounter (Appointment) with Freada Bergeron, MD.     Allergies:   Cephalosporins, Ciprofloxacin, and Baclofen   Social History   Socioeconomic History   Marital status: Widowed    Spouse name: Not on file   Number of children: Not on file   Years of education: Not on file   Highest education level: Not on file  Occupational History   Not on file  Tobacco Use   Smoking status: Former    Types: Cigarettes    Passive exposure: Never   Smokeless tobacco: Never  Substance and Sexual Activity   Alcohol use: Yes    Alcohol/week: 5.0 standard drinks of alcohol    Types: 5 Shots of liquor per week    Comment: socially   Drug use: Never   Sexual activity: Not Currently  Other Topics Concern   Not on file  Social History Narrative  Widowed- lost wife of 13 years to pancreatic cancer- previously married 64 years. Son in [place] and daughter helping in [place]. 4 grandkids.    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       Hobbies: dinner with family- occasional martini   Social Determinants of Health   Financial Resource Strain: Not on file  Food Insecurity: Not on file  Transportation Needs: Not on file  Physical Activity: Not on file  Stress: Not on file  Social Connections: Not on file     Family History: The patient's ***family history is not on file.  ROS:   Please see the history of present illness.    *** All other systems reviewed and are negative.  EKGs/Labs/Other Studies Reviewed:    The following studies were reviewed today: TTE 2020-12-24:  Findings        Left Ventricle    The left ventricle is normal in size  and function with no evidence of    diastolic dysfunction.        Right Ventricle    The right ventricle is normal in size and function.        Left Atrium    The left atrium is normal in size.        Right Atrium    The right atrium is normal in size.        Aortic Valve    Structurally normal aortic valve.    There is trace aortic regurgitation.        Mitral Valve    Mitral sclerosis.    No evidence of mitral regurgitaton.        Tricuspid Valve    The tricuspid valve is structurally normal with normal antegrade flow and    no tricuspid regurgitation.        Pulmonic Valve    Structurally normal pulmonic valve.    Trace pulmonic regurgitation present.        Miscellaneous    The interatrial septum is intact without evidence of atrial septal defect    or patent foramen ovale.    The ventricular septum is intact.    No intracardiac masses, thrombi or vegetations noted.    The aortic root is normal.    Pulmonary arteries appear normal.        Pericardial Effusion    No evidence of pericardial effusion.        Pleural Effusion    There is no pleural effusion.        EKG:  EKG is *** ordered today.  The ekg ordered today demonstrates ***  Recent Labs: 11/17/2021: ALT 31 05/06/2022: BUN 46; Creatinine, Ser 9.19; Hemoglobin 8.8; Platelets 108; Potassium 4.9; Sodium 140  Recent Lipid Panel No results found for: "CHOL", "TRIG", "HDL", "CHOLHDL", "VLDL", "LDLCALC", "LDLDIRECT"   Risk Assessment/Calculations:   {Does this patient have ATRIAL FIBRILLATION?:[phone]}  No BP recorded.  {Refresh Note OR Click here to enter BP  :1}***         Physical Exam:    VS:  There were no vitals taken for this visit.    Wt Readings from Last 3 Encounters:  05/06/22 166 lb (75.3 kg)  04/17/22 166 lb 6.4 oz (75.5 kg)  03/27/22 166 lb (75.3 kg)     GEN: *** Well nourished, well developed in no acute distress HEENT: Normal NECK: No JVD; No carotid bruits LYMPHATICS: No  lymphadenopathy  CARDIAC: ***RRR, no murmurs, rubs, gallops RESPIRATORY:  Clear to auscultation without rales, wheezing or rhonchi  ABDOMEN: Soft, non-tender, non-distended MUSCULOSKELETAL:  No edema; No deformity  SKIN: Warm and dry NEUROLOGIC:  Alert and oriented x 3 PSYCHIATRIC:  Normal affect   ASSESSMENT:    No diagnosis found. PLAN:    In order of problems listed above:  #Persistent Afib: CHADs-vasc ***. Currently on apixaban for [facility]. Not on nodal agents due to hypotension with HD. TTE at OSH with normal LVEF, no significant valve disease.  -Continue apixaban 2.69m BID -Not on nodal agents due to hypotension  #ESRD on HD: -Required midodrine with HD      {Are you ordering a CV Procedure (e.g. stress test, cath, DCCV, TEE, etc)?   Press F2        :[id number]}   Medication Adjustments/Labs and Tests Ordered: Current medicines are reviewed at length with the patient today.  Concerns regarding medicines are outlined above.  No orders of the defined types were placed in this encounter.  No orders of the defined types were placed in this encounter.   There are no Patient Instructions on file for this visit.   Signed, HFreada Bergeron MD  05/19/2022 2:21 PM    CS[facility]

## 2022-05-22 ENCOUNTER — Ambulatory Visit (INDEPENDENT_AMBULATORY_CARE_PROVIDER_SITE_OTHER): Payer: Medicare Other

## 2022-05-22 ENCOUNTER — Telehealth: Payer: Self-pay | Admitting: *Deleted

## 2022-05-22 ENCOUNTER — Ambulatory Visit: Payer: Medicare Other | Attending: Cardiology | Admitting: Cardiology

## 2022-05-22 ENCOUNTER — Encounter: Payer: Self-pay | Admitting: Cardiology

## 2022-05-22 VITALS — BP 88/48 | HR 75 | Ht 68.5 in | Wt 168.8 lb

## 2022-05-22 DIAGNOSIS — Z01812 Encounter for preprocedural laboratory examination: Secondary | ICD-10-CM

## 2022-05-22 DIAGNOSIS — I953 Hypotension of hemodialysis: Secondary | ICD-10-CM

## 2022-05-22 DIAGNOSIS — R0602 Shortness of breath: Secondary | ICD-10-CM | POA: Diagnosis not present

## 2022-05-22 DIAGNOSIS — I480 Paroxysmal atrial fibrillation: Secondary | ICD-10-CM

## 2022-05-22 DIAGNOSIS — N186 End stage renal disease: Secondary | ICD-10-CM

## 2022-05-22 DIAGNOSIS — I4891 Unspecified atrial fibrillation: Secondary | ICD-10-CM | POA: Diagnosis present

## 2022-05-22 NOTE — Progress Notes (Unsigned)
Enrolled for [employer] to mail a ZIO XT long term holter monitor to the patients address on file.  

## 2022-05-22 NOTE — Patient Instructions (Signed)
Medication Instructions:   Your physician recommends that you continue on your current medications as directed. Please refer to the Current Medication list given to you today.  *If you need a refill on your cardiac medications before your next appointment, please call your pharmacy*   Lab Work:  ON [facility] 06/02/22 HERE IN THE OFFICE--PRE-PROCEDURE LABS--WILL CHECK BMET AND CBC W DIFF AT THAT TIME  If you have labs (blood work) drawn today and your tests are completely normal, you will receive your results only by: [place] (if you have MyChart) OR A paper copy in the mail If you have any lab test that is abnormal or we need to change your treatment, we will call you to review the results.   Testing/Procedures:  Your physician has requested that you have an echocardiogram. Echocardiography is a painless test that uses sound waves to create images of your heart. It provides your doctor with information about the size and shape of your heart and how well your heart's chambers and valves are working. This procedure takes approximately one hour. There are no restrictions for this procedure. Please do NOT wear cologne, perfume, aftershave, or lotions (deodorant is allowed). Please arrive 15 minutes prior to your appointment time.    ZIO XT- Long Term Monitor Instructions  Your physician has requested you wear a ZIO patch monitor for 7 days.  This is a single patch monitor. [employer] supplies one patch monitor per enrollment. Additional stickers are not available. Please do not apply patch if you will be having a Nuclear Stress Test,  Echocardiogram, Cardiac CT, MRI, or Chest Xray during the period you would be wearing the  monitor. The patch cannot be worn during these tests. You cannot remove and re-apply the  ZIO XT patch monitor.  Your ZIO patch monitor will be mailed 3 day USPS to your address on file. It may take 3-5 days  to receive your monitor after you have been enrolled.   Once you have received your monitor, please review the enclosed instructions. Your monitor  has already been registered assigning a specific monitor serial # to you.  Billing and Patient Assistance Program Information  We have supplied [employer] with any of your insurance information on file for billing purposes. [employer] offers a sliding scale Patient Assistance Program for patients that do not have  insurance, or whose insurance does not completely cover the cost of the ZIO monitor.  You must apply for the Patient Assistance Program to qualify for this discounted rate.  To apply, please call [employer] at [phone], select option 4, select option 2, ask to apply for  Patient Assistance Program. Theodore Demark will ask your household income, and how many people  are in your household. They will quote your out-of-pocket cost based on that information.  [employer] will also be able to set up a 73-month interest-free payment plan if needed.  Applying the monitor   Shave hair from upper left chest.  Hold abrader disc by orange tab. Rub abrader in 40 strokes over the upper left chest as  indicated in your monitor instructions.  Clean area with 4 enclosed alcohol pads. Let dry.  Apply patch as indicated in monitor instructions. Patch will be placed under collarbone on left  side of chest with arrow pointing upward.  Rub patch adhesive wings for 2 minutes. Remove white label marked "1". Remove the white  label marked "2". Rub patch adhesive wings for 2 additional minutes.  While looking in a mirror, press and  release button in center of patch. A small green light will  flash 3-4 times. This will be your only indicator that the monitor has been turned on.  Do not shower for the first 24 hours. You may shower after the first 24 hours.  Press the button if you feel a symptom. You will hear a small click. Record Date, Time and  Symptom in the Patient Logbook.  When you are ready to remove the patch, follow  instructions on the last 2 pages of Patient  Logbook. Stick patch monitor onto the last page of Patient Logbook.  Place Patient Logbook in the blue and white box. Use locking tab on box and tape box closed  securely. The blue and white box has prepaid postage on it. Please place it in the mailbox as  soon as possible. Your physician should have your test results approximately 7 days after the  monitor has been mailed back to [facility].  Call [place] at [phone] if you have questions regarding  your ZIO XT patch monitor. Call them immediately if you see an orange light blinking on your  monitor.  If your monitor falls off in less than 4 days, contact our Monitor department at [phone].  If your monitor becomes loose or falls off after 4 days call [employer] at [phone] for  suggestions on securing your monitor          Cardiac/Peripheral Catheterization   You are scheduled for a Cardiac Catheterization on Tuesday, November 28 with Dr. Glenetta Hew.  1. Please arrive at the Main Entrance A at [facility]: [place], [place] [address] on November 28 at 5:30 AM (This time is two hours before your procedure to ensure your preparation). Free valet parking service is available. You will check in at ADMITTING. The support person will be asked to wait in the waiting room.  It is OK to have someone drop you off and come back when you are ready to be discharged.        Special note: Every effort is made to have your procedure done on time. Please understand that emergencies sometimes delay scheduled procedures.   . 2. Diet: Do not eat solid foods after midnight.  You may have clear liquids until 5 AM the day of the procedure.  3. Labs: You will need to have blood drawn on Monday, November 27 at [facility] at [facility]. [address]  Open: 7:30am - 5pm    Phone: [phone]. You do not need to be  fasting.  4. Medication instructions in preparation for your procedure:   Contrast Allergy: No    Stop taking Eliquis (Apixiban) on Sunday, November 26.   5. Plan to go home the same day, you will only stay overnight if medically necessary. 6. You MUST have a responsible adult to drive you home. 7. An adult MUST be with you the first 24 hours after you arrive home. 8. Bring a current list of your medications, and the last time and date medication taken. 9. Bring ID and current insurance cards. 10.Please wear clothes that are easy to get on and off and wear slip-on shoes.  Thank you for allowing [place] to care for you!   -- [facility]    Follow-Up:  1  MONTH WITH AN EXTENDER IN THE OFFICE

## 2022-05-22 NOTE — Progress Notes (Signed)
Cardiology Office Note:    Date:  05/22/2022   ID:  Joshua Riley, DOB 07-06-1936, MRN [id number]  PCP:  Marin Olp, MD   [facility] Providers Cardiologist:  None {    Referring MD: Marin Olp, MD    History of Present Illness:    Joshua Riley is a 86 y.o. male with a hx of DVT, ulcerative colitis, ESRD on HD, hypotension on midodrine, and persistent Afib on apixaban who was referred by Dr. Yong Channel for further evaluation of Afib.  Patient was seen by Dr. Yong Channel on 04/2022. Has known history of persistent Afib for which he has been maintained on eliquis. Not on nodal agents due to hypotension with HD requiring midodrine. He is now referred to Cardiology for further evaluation.  Per review of prior Cardiology records, has history of stress test in 11/2020 which was negative for ischemia. TTE with normal LVEF with "mitral sclerosis."  Today, he reports that he is not feeling well. He has been dealing with some significant SOB. It is worse when he has dialysis.  He says this has been going on for a long time, since he had COVID. He did see a pulmonary doctor when he was in [place] but he was not told what may be the cause of his symptoms. No exertional chest pain but states that he can barely make it more than a couple of feet before needing to stop to rest.   He has underlying Afib as well for which he is on apixaban. Denies any palpitations and he is unsure what his HR are running during or after HD. His HR is 57 today and he is currently in NSR. Of note, he is very hypotensive at baseline and requires midodrine 26m TID for BP support. Takes midodrine even on non-HD days.  He denies any palpitations, chest pain, or peripheral edema. No headaches, syncope, orthopnea, or PND.      Past Medical History:  Diagnosis Date   A-fib ([facility]    Anemia    Arthritis    Cancer ([facility]    Basal cell   Chronic kidney disease    COVID-19    2021   Dyspnea     Dysrhythmia    Afib-controlled on eliquis   ESRD (end stage renal disease) ([place] 10/22/2021   Glaucoma 11/18/2021   History of DVT (deep vein thrombosis)    Hydronephrosis    managed wtih a PCN   Idiopathic neuropathy 10/22/2021   lyrica    [facility]    Obstructive uropathy    With chronic left nephrostomy   Old retinal detachment, total or subtotal    Orthostatic hypotension 10/22/2021   Sleep apnea    does not need a machine   Stroke ([facility]    Ulcerative colitis ([place]    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    Past Surgical History:  Procedure Laterality Date   BASAL CELL CARCINOMA EXCISION     10/23   COLON SURGERY     creation of j pouch     and subsequent takedown of j pouch   EYE SURGERY     IR NEPHROSTOMY EXCHANGE LEFT  12/10/2021   IR NEPHROSTOMY EXCHANGE LEFT  04/22/2022   REVISION OF ARTERIOVENOUS GORETEX GRAFT Left 05/06/2022   Procedure: REDO LEFT THIGH ARTERIOVENOUS 4-7 MM GORETEX GRAFT;  Surgeon: DAngelia Mould MD;  [place]  Service: Vascular;  Laterality: Left;   SMALL  INTESTINE SURGERY     TOTAL COLECTOMY      Current Medications: Current Meds  Medication Sig   acetaminophen (TYLENOL) 325 MG tablet Take 650 mg by mouth daily as needed for fever or headache (pain).   albuterol (VENTOLIN HFA) 108 (90 Base) MCG/ACT inhaler TAKE 2 PUFFS BY MOUTH EVERY 6 HOURS AS NEEDED FOR WHEEZE OR SHORTNESS OF BREATH   ELIQUIS 2.5 MG TABS tablet Take 1 tablet (2.5 mg total) by mouth 2 (two) times daily. (Patient taking differently: Take 2.5 mg by mouth daily.)   latanoprost (XALATAN) 0.005 % ophthalmic solution Place 1 drop into the right eye at bedtime.   midodrine (PROAMATINE) 10 MG tablet Take 1 tablet (10 mg total) by mouth 3 (three) times daily. Takes 5 mg plus 20m for 15 mg total three times a day- started by nephrology in [place]   midodrine (PROAMATINE) 5 MG tablet Take 1 tablet (5 mg total) by mouth 3 (three)  times daily with meals. Takes 5 mg plus 166mfor 15 mg total three times a day- started by nephrology in [place]   Omega 3 1000 MG CAPS Take by mouth.   OVER THE COUNTER MEDICATION Take 1 tablet by mouth daily. Bio Smooth   oxyCODONE-acetaminophen (PERCOCET) 5-325 MG tablet Take 1 tablet by mouth every 6 (six) hours as needed for severe pain.   pregabalin (LYRICA) 50 MG capsule Take 1 capsule (50 mg total) by mouth daily.   sevelamer carbonate (RENVELA) 800 MG tablet Take 2,400 mg by mouth 3 (three) times daily with meals.   timolol (TIMOPTIC) 0.5 % ophthalmic solution Place 1 drop into both eyes daily.   triamcinolone cream (KENALOG) 0.1 % Apply topically 2 (two) times daily.     Allergies:   Cephalosporins, Ciprofloxacin, and Baclofen   Social History   Socioeconomic History   Marital status: Widowed    Spouse name: Not on file   Number of children: Not on file   Years of education: Not on file   Highest education level: Not on file  Occupational History   Not on file  Tobacco Use   Smoking status: Former    Packs/day: 2.00    Years: 6.00    Total pack years: 12.00    Types: Cigarettes    Quit date: 1927  Years since quitting: 38.8    Passive exposure: Never   Smokeless tobacco: Never  Vaping Use   Vaping Use: Never used  Substance and Sexual Activity   Alcohol use: Yes    Alcohol/week: 5.0 standard drinks of alcohol    Types: 5 Shots of liquor per week    Comment: socially   Drug use: Never   Sexual activity: Not Currently  Other Topics Concern   Not on file  Social History Narrative   Widowed- lost wife of 13 years to pancreatic cancer- previously married 4376ears. [id number] grand[place].    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       Hobbies: dinner with family- occasional martini   Social Determinants of Health   Financial Resource Strain: Not on file  Food Insecurity: Not  on file  Transportation Needs: Not on file  Physical Activity: Not on file  Stress: Not on file  Social Connections: Not on file     Family History: The patient's family history includes Cancer in his  father; Esophageal cancer in his brother; Stroke in his mother.  ROS:   Review of Systems  Constitutional:  Negative for chills and fever.  HENT:  Negative for hearing loss and tinnitus.   Eyes:  Negative for blurred vision and double vision.  Respiratory:  Positive for shortness of breath ((exertional)). Negative for cough and hemoptysis.   Cardiovascular:  Positive for leg swelling ((bilateral ankles)). Negative for chest pain, palpitations, orthopnea, claudication and PND.  Gastrointestinal:  Negative for abdominal pain and diarrhea.  Genitourinary:  Negative for dysuria and urgency.  Musculoskeletal:  Positive for myalgias ((cramping in hands and feet after dialysis)). Negative for neck pain.  Skin:  Negative for itching and rash.  Neurological:  Positive for dizziness. Negative for headaches.  Endo/Heme/Allergies:  Negative for polydipsia. Does not bruise/bleed easily.  Psychiatric/Behavioral:  Negative for hallucinations and suicidal ideas.      EKGs/Labs/Other Studies Reviewed:    The following studies were reviewed today: Dialysis access Doppler 12/24/2021: Summary:  No color flow or Doppler signal in the groin and proximal thigh, appears  occluded.   TTE 11/2020:  Findings        Left Ventricle    The left ventricle is normal in size and function with no evidence of    diastolic dysfunction.        Right Ventricle    The right ventricle is normal in size and function.        Left Atrium    The left atrium is normal in size.        Right Atrium    The right atrium is normal in size.        Aortic Valve    Structurally normal aortic valve.    There is trace aortic regurgitation.        Mitral Valve    Mitral sclerosis.    No evidence of mitral regurgitaton.         Tricuspid Valve    The tricuspid valve is structurally normal with normal antegrade flow and    no tricuspid regurgitation.        Pulmonic Valve    Structurally normal pulmonic valve.    Trace pulmonic regurgitation present.        Miscellaneous    The interatrial septum is intact without evidence of atrial septal defect    or patent foramen ovale.    The ventricular septum is intact.    No intracardiac masses, thrombi or vegetations noted.    The aortic root is normal.    Pulmonary arteries appear normal.        Pericardial Effusion    No evidence of pericardial effusion.        Pleural Effusion    There is no pleural effusion.        EKG:  EKG is personally reviewed.  05/22/2022: NSR, first degree AVB, PACs  Recent Labs: 11/17/2021: ALT 31 05/06/2022: BUN 46; Creatinine, Ser 9.19; Hemoglobin 8.8; Platelets 108; Potassium 4.9; Sodium 140  Recent Lipid Panel No results found for: "CHOL", "TRIG", "HDL", "CHOLHDL", "VLDL", "LDLCALC", "LDLDIRECT"   Risk Assessment/Calculations:    CHA2DS2-VASc Score = 3  This indicates a 3.2% annual risk of stroke. The patient's score is based upon: CHF History: 0 HTN History: 0 Diabetes History: 0 Stroke History: 0 Vascular Disease History: 1 Age Score: 2 Gender Score: 0               Physical Exam:    VS:  BP (!) 88/48   Pulse 75   Ht 5' 8.5" (1.74 m)   Wt 168 lb 12.8 oz (76.6 kg)   SpO2 (!) 89%   BMI 25.29 kg/m     Wt Readings from Last 3 Encounters:  05/22/22 168 lb 12.8 oz (76.6 kg)  05/06/22 166 lb (75.3 kg)  04/17/22 166 lb 6.4 oz (75.5 kg)     GEN:  Well nourished, well developed in no acute distress HEENT: Normal NECK: No JVD; No carotid bruits CARDIAC:  Irregularly, no murmurs, rubs, gallops RESPIRATORY:  Clear to auscultation without rales, wheezing or rhonchi  ABDOMEN: Soft, non-tender, non-distended MUSCULOSKELETAL:  Trace ankle edema, warm  SKIN: Warm and dry NEUROLOGIC:  Alert and  oriented x 3 PSYCHIATRIC:  Normal affect   ASSESSMENT:    1. SOB (shortness of breath)   2. ESRD (end stage renal disease) ([place])   3. Paroxysmal atrial fibrillation ([place])   4. Pre-procedure lab exam   5. Hemodialysis-associated hypotension    PLAN:    In order of problems listed above:  #Dyspnea on Exertion: Patient reports 2 year history of dyspnea on exertion. Initially developed after COVID but has persisted and slightly worsened over the past several months. Has been seen by Pulm in [facility] where work-up was reportedly unremarkable. TTE 2022 in [place] with normal BiV function and no significant valve disease. No prior ischemic work-up. Given the degree of his symptoms where he is unable to walk more than several feet before needing to rest, will plan for cath for further evaluation. Will coordinate with HD as well.  -Plan for LHC on T or Th -Will receive HD following cath  #Persistent Afib: CHADs-vasc 3. Currently on apixaban for [facility]. Not on nodal agents due to hypotension. TTE at OSH with normal LVEF, no significant valve disease.  Will check zio monitor to ensure not having episodes of RVR on HD days contributing to SOB. -Continue apixaban 2.27m BID -Not on nodal agents due to hypotension -Check 7 day zio to ensure not having RVR or block that is contributing to SOB  #ESRD on HD: Followed by Nephrology. On M, W, F HD.  #Hypotension: Bps chronically in the 80s. Requires midodrine 119mTID even on non-HD days.      Shared Decision Making/Informed Consent The risks [stroke (1 in 1000), death (1 in 1000), kidney failure [usually temporary] (1 in 500), bleeding (1 in 200), allergic reaction [possibly serious] (1 in 200)], benefits (diagnostic support and management of coronary artery disease) and alternatives of a cardiac catheterization were discussed in detail with Mr. FaRiolond he is willing to proceed.   Follow up: 3 months Medication Adjustments/Labs and Tests Ordered: Current  medicines are reviewed at length with the patient today.  Concerns regarding medicines are outlined above.  Orders Placed This Encounter  Procedures   CBC w/Diff   Basic metabolic panel   LONG TERM MONITOR (3-14 DAYS)   EKG 12-Lead   ECHOCARDIOGRAM COMPLETE   No orders of the defined types were placed in this encounter.   Patient Instructions  Medication Instructions:   Your physician recommends that you continue on your current medications as directed. Please refer to the Current Medication list given to you today.  *If you need a refill on your cardiac medications before your next appointment, please call your pharmacy*   Lab Work:  [facility] IN [facility] OFFICE--PRE-PROCEDURE LABS--WILL CHECK BMET AND CBC W DIFF AT THAT TIME  If you have labs (  blood work) drawn today and your tests are completely normal, you will receive your results only by: [place] (if you have MyChart) OR A paper copy in the mail If you have any lab test that is abnormal or we need to change your treatment, we will call you to review the results.   Testing/Procedures:  Your physician has requested that you have an echocardiogram. Echocardiography is a painless test that uses sound waves to create images of your heart. It provides your doctor with information about the size and shape of your heart and how well your heart's chambers and valves are working. This procedure takes approximately one hour. There are no restrictions for this procedure. Please do NOT wear cologne, perfume, aftershave, or lotions (deodorant is allowed). Please arrive 15 minutes prior to your appointment time.    ZIO XT- Long Term Monitor Instructions  Your physician has requested you wear a ZIO patch monitor for 7 days.  This is a single patch monitor. [employer] supplies one patch monitor per enrollment. Additional stickers are not available. Please do not apply patch if you will be having a Nuclear Stress Test,   Echocardiogram, Cardiac CT, MRI, or Chest Xray during the period you would be wearing the  monitor. The patch cannot be worn during these tests. You cannot remove and re-apply the  ZIO XT patch monitor.  Your ZIO patch monitor will be mailed 3 day USPS to your address on file. It may take 3-5 days  to receive your monitor after you have been enrolled.  Once you have received your monitor, please review the enclosed instructions. Your monitor  has already been registered assigning a specific monitor serial # to you.  Billing and Patient Assistance Program Information  We have supplied [employer] with any of your insurance information on file for billing purposes. [employer] offers a sliding scale Patient Assistance Program for patients that do not have  insurance, or whose insurance does not completely cover the cost of the ZIO monitor.  You must apply for the Patient Assistance Program to qualify for this discounted rate.  To apply, please call [employer] at [phone], select option 4, select option 2, ask to apply for  Patient Assistance Program. Theodore Demark will ask your household income, and how many people  are in your household. They will quote your out-of-pocket cost based on that information.  [employer] will also be able to set up a 70-month interest-free payment plan if needed.  Applying the monitor   Shave hair from upper left chest.  Hold abrader disc by orange tab. Rub abrader in 40 strokes over the upper left chest as  indicated in your monitor instructions.  Clean area with 4 enclosed alcohol pads. Let dry.  Apply patch as indicated in monitor instructions. Patch will be placed under collarbone on left  side of chest with arrow pointing upward.  Rub patch adhesive wings for 2 minutes. Remove white label marked "1". Remove the white  label marked "2". Rub patch adhesive wings for 2 additional minutes.  While looking in a mirror, press and release button in center of patch. A small  green light will  flash 3-4 times. This will be your only indicator that the monitor has been turned on.  Do not shower for the first 24 hours. You may shower after the first 24 hours.  Press the button if you feel a symptom. You will hear a small click. Record Date, Time and  Symptom in the Patient Logbook.  When you are ready to remove the patch, follow instructions on the last 2 pages of Patient  Logbook. Stick patch monitor onto the last page of Patient Logbook.  Place Patient Logbook in the blue and white box. Use locking tab on box and tape box closed  securely. The blue and white box has prepaid postage on it. Please place it in the mailbox as  soon as possible. Your physician should have your test results approximately 7 days after the  monitor has been mailed back to [facility].  Call [facility] at [phone] if you have questions regarding  your ZIO XT patch monitor. Call them immediately if you see an orange light blinking on your  monitor.  If your monitor falls off in less than 4 days, contact our Monitor department at [phone].  If your monitor becomes loose or falls off after 4 days call [employer] at [phone] for  suggestions on securing your monitor          Cardiac/Peripheral Catheterization   You are scheduled for a Cardiac Catheterization on Tuesday, November 28 with Dr. Glenetta Hew.  1. Please arrive at the Main Entrance A at [facility]: [place], [place] [address] on November 28 at 5:30 AM (This time is two hours before your procedure to ensure your preparation). Free valet parking service is available. You will check in at ADMITTING. The support person will be asked to wait in the waiting room.  It is OK to have someone drop you off and come back when you are ready to be discharged.        Special note: Every effort is made to have your procedure done on time. Please understand that emergencies sometimes  delay scheduled procedures.   . 2. Diet: Do not eat solid foods after midnight.  You may have clear liquids until 5 AM the day of the procedure.  3. Labs: You will need to have blood drawn on Monday, November 27 at [facility] at [facility]. [address]  Open: 7:30am - 5pm    Phone: [phone]. You do not need to be fasting.  4. Medication instructions in preparation for your procedure:   Contrast Allergy: No    Stop taking Eliquis (Apixiban) on Sunday, November 26.   5. Plan to go home the same day, you will only stay overnight if medically necessary. 6. You MUST have a responsible adult to drive you home. 7. An adult MUST be with you the first 24 hours after you arrive home. 8. Bring a current list of your medications, and the last time and date medication taken. 9. Bring ID and current insurance cards. 10.Please wear clothes that are easy to get on and off and wear slip-on shoes.  Thank you for allowing [place] to care for you!   -- [facility]    Follow-Up:  1  MONTH WITH AN EXTENDER IN THE OFFICE               I,Jessica Ford,acting as a scribe for Freada Bergeron, MD.,have documented all relevant documentation on the behalf of Freada Bergeron, MD,as directed by  Freada Bergeron, MD while in the presence of Freada Bergeron, MD.   I, Freada Bergeron, MD, have reviewed all documentation for this visit. The documentation on 05/22/22 for the exam, diagnosis, procedures, and orders are all accurate and complete.   Signed, Greer Ee  Johney Frame, MD  05/22/2022 9:49 AM    [employer]

## 2022-05-22 NOTE — Telephone Encounter (Signed)
-----   Message from Jennefer Bravo sent at 05/22/2022 11:10 AM EST ----- Regarding: RE: 7 day zio per Dr. Johney Frame done ----- Message ----- From: Nuala Alpha, LPN Sent: 05/09/1593   9:35 AM EST To: Jennefer Bravo; Katrina Cloyd Stagers Subject: 7 day zio per Dr. Johney Frame                    7 day zio for afib ordered  Please enroll and let me know when you do   Thanks Montario Zilka

## 2022-05-26 ENCOUNTER — Telehealth: Payer: Self-pay

## 2022-05-26 NOTE — Telephone Encounter (Signed)
Pt called to schedule his post op appt. He did not realize he had been scheduled last week. I offered him an appt this week and he refused it due to not wanting to move his HD time. He is wanting to r/s for next week. Pt has been r/s and is aware of this appt. No questions/concerns at this time.

## 2022-05-27 ENCOUNTER — Ambulatory Visit (INDEPENDENT_AMBULATORY_CARE_PROVIDER_SITE_OTHER): Payer: Medicare Other

## 2022-05-27 ENCOUNTER — Ambulatory Visit (HOSPITAL_COMMUNITY): Payer: Medicare Other | Attending: Cardiology

## 2022-05-27 DIAGNOSIS — Z0000 Encounter for general adult medical examination without abnormal findings: Secondary | ICD-10-CM

## 2022-05-27 DIAGNOSIS — R0602 Shortness of breath: Secondary | ICD-10-CM | POA: Insufficient documentation

## 2022-05-27 DIAGNOSIS — I480 Paroxysmal atrial fibrillation: Secondary | ICD-10-CM | POA: Diagnosis present

## 2022-05-27 LAB — ECHOCARDIOGRAM COMPLETE
Area-P 1/2: 3.4 cm2
P 1/2 time: 616 msec
S' Lateral: 2.6 cm

## 2022-05-27 NOTE — Progress Notes (Signed)
I connected with  Langston Reusing on 05/27/22 by a audio enabled telemedicine application and verified that I am speaking with the correct person using two identifiers.  Patient Location: Home  Provider Location: Office/Clinic  I discussed the limitations of evaluation and management by telemedicine. The patient expressed understanding and agreed to proceed.   Subjective:   Joshua Riley is a 86 y.o. male who presents for Medicare Annual/Subsequent preventive examination.  Review of Systems     Cardiac Risk Factors include: advanced age (>33mn, >>70women);male gender     Objective:    There were no vitals filed for this visit. There is no height or weight on file to calculate BMI.     05/27/2022    9:18 AM 05/06/2022    6:13 AM 11/17/2021   10:01 PM  Advanced Directives  Does Patient Have a Medical Advance Directive? No No Yes  Would patient like information on creating a medical advance directive? No - Patient declined No - Patient declined     Current Medications (verified) Outpatient Encounter Medications as of 05/27/2022  Medication Sig   acetaminophen (TYLENOL) 325 MG tablet Take 650 mg by mouth daily as needed for fever or headache (pain).   albuterol (VENTOLIN HFA) 108 (90 Base) MCG/ACT inhaler TAKE 2 PUFFS BY MOUTH EVERY 6 HOURS AS NEEDED FOR WHEEZE OR SHORTNESS OF BREATH   Cholecalciferol 50 MCG (2000 UT) CAPS 1 TAB BY MOUTH DAILY 25 MCG = 1,000 UNITS   ELIQUIS 2.5 MG TABS tablet Take 1 tablet (2.5 mg total) by mouth 2 (two) times daily. (Patient taking differently: Take 2.5 mg by mouth daily.)   latanoprost (XALATAN) 0.005 % ophthalmic solution Place 1 drop into the right eye at bedtime.   Omega 3 1000 MG CAPS Take by mouth.   OVER THE COUNTER MEDICATION Take 1 tablet by mouth daily. Bio Smooth   OVER THE COUNTER MEDICATION Take 1 capsule by mouth daily. Kidney Cop   pregabalin (LYRICA) 50 MG capsule Take 1 capsule (50 mg total) by mouth daily.   sevelamer  carbonate (RENVELA) 800 MG tablet Take 2,400 mg by mouth 3 (three) times daily with meals.   timolol (TIMOPTIC) 0.5 % ophthalmic solution Place 1 drop into both eyes daily.   triamcinolone cream (KENALOG) 0.1 % Apply topically 2 (two) times daily.   clotrimazole (MYCELEX) 10 MG troche Take 1 tablet (10 mg total) by mouth 5 (five) times daily. For thrush (Patient not taking: Reported on 05/02/2022)   Cyanocobalamin (VITAMIN B 12 PO) Take 1 tablet by mouth daily. (Patient not taking: Reported on 05/22/2022)   midodrine (PROAMATINE) 10 MG tablet Take 1 tablet (10 mg total) by mouth 3 (three) times daily. Takes 5 mg plus 115mfor 15 mg total three times a day- [place] Reported on 05/27/2022)   midodrine (PROAMATINE) 5 MG tablet Take 1 tablet (5 mg total) by mouth 3 (three) times daily with meals. Takes 5 mg plus 1060mor 15 mg total three times a day- [place] taking: Reported on 05/27/2022)   oxyCODONE-acetaminophen (PERCOCET) 5-325 MG tablet Take 1 tablet by mouth every 6 (six) hours as needed for severe pain. (Patient not taking: Reported on 05/27/2022)   No facility-administered encounter medications on file as of 05/27/2022.    Allergies (verified) Cephalosporins, Ciprofloxacin, and Baclofen   History: Past Medical History:  Diagnosis Date   A-fib (HCC)    Anemia  Arthritis    Cancer ([place])    Basal cell   Chronic kidney disease    COVID-19    2021   Dyspnea    Dysrhythmia    Afib-controlled on eliquis   ESRD (end stage renal disease) ([place]) 10/22/2021   Glaucoma 11/18/2021   History of DVT (deep vein thrombosis)    Hydronephrosis    managed wtih a PCN   Idiopathic neuropathy 10/22/2021   lyrica    Ileostomy in place [facility])    Obstructive uropathy    With chronic left nephrostomy   Old retinal detachment, total or subtotal    Orthostatic hypotension 10/22/2021   Sleep apnea    does not need a  machine   Stroke ([place])    Ulcerative colitis ([place])    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   Past Surgical History:  Procedure Laterality Date   BASAL CELL CARCINOMA EXCISION     10/23   COLON SURGERY     creation of j pouch     and subsequent takedown of j pouch   EYE SURGERY     IR NEPHROSTOMY EXCHANGE LEFT  12/10/2021   IR NEPHROSTOMY EXCHANGE LEFT  04/22/2022   REVISION OF ARTERIOVENOUS GORETEX GRAFT Left 05/06/2022   Procedure: REDO LEFT THIGH ARTERIOVENOUS 4-7 MM GORETEX GRAFT;  Surgeon: Angelia Mould, MD;  Location: [facility] OR;  Service: Vascular;  Laterality: Left;   SMALL INTESTINE SURGERY     TOTAL COLECTOMY     Family History  Problem Relation Age of Onset   Stroke Mother    Cancer Father    Esophageal cancer Brother    Social History   Socioeconomic History   Marital status: Widowed    Spouse name: Not on file   Number of children: Not on file   Years of education: Not on file   Highest education level: Not on file  Occupational History   Not on file  Tobacco Use   Smoking status: Former    Packs/day: 2.00    Years: 6.00    Total pack years: 12.00    Types: Cigarettes    Quit date: 54    Years since quitting: 38.9    Passive exposure: Never   Smokeless tobacco: Never  Vaping Use   Vaping Use: Never used  Substance and Sexual Activity   Alcohol use: Yes    Alcohol/week: 5.0 standard drinks of alcohol    Types: 5 Shots of liquor per week    Comment: socially   Drug use: Never   Sexual activity: Not Currently  Other Topics Concern   Not on file  Social History Narrative   Widowed- lost wife of 13 years to pancreatic cancer- previously married 27 years. Son in [place] and daughter helping in [place]. 4 grandkids.    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       Hobbies: dinner with family- occasional martini   Social Determinants of Health   Financial Resource Strain: Low  Risk  (05/27/2022)   Overall Financial Resource Strain (CARDIA)    Difficulty of Paying Living Expenses: Not hard at all  Food Insecurity: No Food Insecurity (05/27/2022)   Hunger Vital Sign    Worried About Running Out of Food in the Last Year: Never true    Ran Out of Food in the Last Year: Never true  Transportation Needs: No Transportation  Needs (05/27/2022)   PRAPARE - Hydrologist (Medical): No    Lack of Transportation (Non-Medical): No  Physical Activity: Inactive (05/27/2022)   Exercise Vital Sign    Days of Exercise per Week: 0 days    Minutes of Exercise per Session: 0 min  Stress: No Stress Concern Present (05/27/2022)   [place]    Feeling of Stress : Not at all  Social Connections: Moderately Integrated (05/27/2022)   Social Connection and Isolation Panel [NHANES]    Frequency of Communication with Friends and Family: More than three times a week    Frequency of Social Gatherings with Friends and Family: More than three times a week    Attends Religious Services: More than 4 times per year    Active Member of [employer] or Organizations: Yes    Attends Archivist Meetings: 1 to 4 times per year    Marital Status: Widowed    Tobacco Counseling Counseling given: Not Answered   Clinical Intake:  Pre-visit preparation completed: Yes  Pain : No/denies pain     BMI - recorded: 25.29 Nutritional Status: BMI 25 -29 Overweight Nutritional Risks: None Diabetes: No  How often do you need to have someone help you when you read instructions, pamphlets, or other written materials from your doctor or pharmacy?: 1 - Never  Diabetic?no  Interpreter Needed?: No  Information entered by :: Charlott Rakes, LPN   Activities of Daily Living    05/27/2022    9:19 AM 05/06/2022    6:18 AM  In your present state of health, do you have any difficulty performing the  following activities:  Hearing? 1 1  Comment wears a hearing aid   Vision? 0 0  Difficulty concentrating or making decisions? 0 1  Comment  sometimes (related to age per pt)  Walking or climbing stairs? 0 0  Dressing or bathing? 0 0  Doing errands, shopping? 0   Preparing Food and eating ? N   Using the Toilet? N   In the past six months, have you accidently leaked urine? N   Do you have problems with loss of bowel control? N   Managing your Medications? N   Managing your Finances? N   Housekeeping or managing your Housekeeping? N     Patient Care Team: Marin Olp, MD as PCP - General (Family Medicine) Center, [facility]  Indicate any recent Medical Services you may have received from other than [facility] providers in the past year (date may be approximate).     Assessment:   This is a routine wellness examination for Joshua Riley.  Hearing/Vision screen Hearing Screening - Comments:: Wears hearing aids  Vision Screening - Comments:: Pt follows up with Dr Katy Fitch for annul eye exams   Dietary issues and exercise activities discussed: Current Exercise Habits: The patient does not participate in regular exercise at present   Goals Addressed             This Visit's Progress    Patient Stated       Stay healthy        Depression Screen    05/27/2022    9:16 AM 10/22/2021    9:19 AM  PHQ 2/9 Scores  PHQ - 2 Score 0 0    Fall Risk    05/27/2022    9:19 AM 10/22/2021    9:18 AM  Fall Risk   Falls in  the past year? 0 0  Number falls in past yr: 0 0  Injury with Fall? 0 0  Risk for fall due to : Impaired vision   Follow up Falls prevention discussed     FALL RISK PREVENTION PERTAINING TO THE HOME:  Any stairs in or around the home? No  If so, are there any without handrails? No  Home free of loose throw rugs in walkways, pet beds, electrical cords, etc? Yes  Adequate lighting in your home to reduce risk of falls? Yes   ASSISTIVE DEVICES  UTILIZED TO PREVENT FALLS:  Life alert? No  Use of a cane, walker or w/c? No  Grab bars in the bathroom? No  Shower chair or bench in shower? Yes  Elevated toilet seat or a handicapped toilet? No   TIMED UP AND GO:  Was the test performed? No .   Cognitive Function:        05/27/2022    9:24 AM  6CIT Screen  What Year? 0 points  What month? 0 points  What time? 0 points  Count back from 20 0 points  Months in reverse 0 points  Repeat phrase 6 points  Total Score 6 points    Immunizations Immunization History  Administered Date(s) Administered   Influenza, High Dose Seasonal PF 04/20/2018   Influenza, Seasonal, Injecte, Preservative Fre 05/16/2014   Influenza-Unspecified 03/17/2012, 04/15/2013, 05/16/2014, 05/09/2015, 05/07/2016, 03/24/2017, 05/31/2017, 04/10/2019   PFIZER(Purple Top)SARS-COV-2 Vaccination 07/16/2019, 08/09/2019   Pneumococcal Conjugate-13 01/09/2016, 04/06/2016   Pneumococcal Polysaccharide-23 03/17/2012, 11/10/2017   Zoster, Live 04/18/2013      Flu Vaccine status: Due, Education has been provided regarding the importance of this vaccine. Advised may receive this vaccine at local pharmacy or Health Dept. Aware to provide a copy of the vaccination record if obtained from local pharmacy or Health Dept. Verbalized acceptance and understanding.  Pneumococcal vaccine status: Up to date  Covid-19 vaccine status: Completed vaccines  Qualifies for Shingles Vaccine? Yes   Zostavax completed No   Shingrix Completed?: No.    Education has been provided regarding the importance of this vaccine. Patient has been advised to call insurance company to determine out of pocket expense if they have not yet received this vaccine. Advised may also receive vaccine at local pharmacy or Health Dept. Verbalized acceptance and understanding.  Screening Tests Health Maintenance  Topic Date Due   COVID-19 Vaccine (3 - 2023-24 season) 03/07/2022   Zoster Vaccines-  Shingrix (1 of 2) 07/18/2022 (Originally 06/12/1986)   INFLUENZA VACCINE  10/05/2022 (Originally 02/04/2022)   Medicare Annual Wellness (AWV)  05/28/2023   Pneumonia Vaccine 66+ Years old  Completed   HPV VACCINES  Aged Out    Health Maintenance  Health Maintenance Due  Topic Date Due   COVID-19 Vaccine (3 - 2023-24 season) 03/07/2022    Colorectal cancer screening: No longer required.    Additional Screening:   Vision Screening: Recommended annual ophthalmology exams for early detection of glaucoma and other disorders of the eye. Is the patient up to date with their annual eye exam?  Yes  Who is the provider or what is the name of the office in which the patient attends annual eye exams? Dr Katy Fitch  If pt is not established with a provider, would they like to be referred to a provider to establish care? No .   Dental Screening: Recommended annual dental exams for proper oral hygiene  Community Resource Referral / Chronic Care Management: CRR required this visit?  No   CCM required this visit?  No      Plan:     I have personally reviewed and noted the following in the patient's chart:   Medical and social history Use of alcohol, tobacco or illicit drugs  Current medications and supplements including opioid prescriptions. Patient is not currently taking opioid prescriptions. Functional ability and status Nutritional status Physical activity Advanced directives List of other physicians Hospitalizations, surgeries, and ER visits in previous 12 months Vitals Screenings to include cognitive, depression, and falls Referrals and appointments  In addition, I have reviewed and discussed with patient certain preventive protocols, quality metrics, and best practice recommendations. A written personalized care plan for preventive services as well as general preventive health recommendations were provided to patient.     Willette Brace, LPN   [id number]   Nurse Notes: none

## 2022-05-27 NOTE — Patient Instructions (Signed)
Joshua Riley , Thank you for taking time to come for your Medicare Wellness Visit. I appreciate your ongoing commitment to your health goals. Please review the following plan we discussed and let me know if I can assist you in the future.   These are the goals we discussed:  Goals      Patient Stated     Stay healthy         This is a list of the screening recommended for you and due dates:  Health Maintenance  Topic Date Due   COVID-19 Vaccine (3 - 2023-24 season) 03/07/2022   Zoster (Shingles) Vaccine (1 of 2) 07/18/2022*   Flu Shot  10/05/2022*   Medicare Annual Wellness Visit  05/28/2023   Pneumonia Vaccine  Completed   HPV Vaccine  Aged Out  *Topic was postponed. The date shown is not the original due date.    Advanced directives: Advance directive discussed with you today. Even though you declined this today please call our office should you change your mind and we can give you the proper paperwork for you to fill out.  Conditions/risks identified: stay healthy   Next appointment: Follow up in one year for your annual wellness visit.   Preventive Care 84 Years and Older, Male  Preventive care refers to lifestyle choices and visits with your health care provider that can promote health and wellness. What does preventive care include? A yearly physical exam. This is also called an annual well check. Dental exams once or twice a year. Routine eye exams. Ask your health care provider how often you should have your eyes checked. Personal lifestyle choices, including: Daily care of your teeth and gums. Regular physical activity. Eating a healthy diet. Avoiding tobacco and drug use. Limiting alcohol use. Practicing safe sex. Taking low doses of aspirin every day. Taking vitamin and mineral supplements as recommended by your health care provider. What happens during an annual well check? The services and screenings done by your health care provider during your annual well  check will depend on your age, overall health, lifestyle risk factors, and family history of disease. Counseling  Your health care provider may ask you questions about your: Alcohol use. Tobacco use. Drug use. Emotional well-being. Home and relationship well-being. Sexual activity. Eating habits. History of falls. Memory and ability to understand (cognition). Work and work Statistician. Screening  You may have the following tests or measurements: Height, weight, and BMI. Blood pressure. Lipid and cholesterol levels. These may be checked every 5 years, or more frequently if you are over 84 years old. Skin check. Lung cancer screening. You may have this screening every year starting at age 84 if you have a 30-pack-year history of smoking and currently smoke or have quit within the past 15 years. Fecal occult blood test (FOBT) of the stool. You may have this test every year starting at age 84. Flexible sigmoidoscopy or colonoscopy. You may have a sigmoidoscopy every 5 years or a colonoscopy every 10 years starting at age 84. Prostate cancer screening. Recommendations will vary depending on your family history and other risks. Hepatitis C blood test. Hepatitis B blood test. Sexually transmitted disease (STD) testing. Diabetes screening. This is done by checking your blood sugar (glucose) after you have not eaten for a while (fasting). You may have this done every 1-3 years. Abdominal aortic aneurysm (AAA) screening. You may need this if you are a current or former smoker. Osteoporosis. You may be screened starting at age 84  if you are at high risk. Talk with your health care provider about your test results, treatment options, and if necessary, the need for more tests. Vaccines  Your health care provider may recommend certain vaccines, such as: Influenza vaccine. This is recommended every year. Tetanus, diphtheria, and acellular pertussis (Tdap, Td) vaccine. You may need a Td booster  every 10 years. Zoster vaccine. You may need this after age 84. Pneumococcal 13-valent conjugate (PCV13) vaccine. One dose is recommended after age 84. Pneumococcal polysaccharide (PPSV23) vaccine. One dose is recommended after age 84. Talk to your health care provider about which screenings and vaccines you need and how often you need them. This information is not intended to replace advice given to you by your health care provider. Make sure you discuss any questions you have with your health care provider. Document Released: 07/20/2015 Document Revised: 03/12/2016 Document Reviewed: 04/24/2015 Elsevier Interactive Patient Education  2017 [place] Prevention in the Home Falls can cause injuries. They can happen to people of all ages. There are many things you can do to make your home safe and to help prevent falls. What can I do on the outside of my home? Regularly fix the edges of walkways and driveways and fix any cracks. Remove anything that might make you trip as you walk through a door, such as a raised step or threshold. Trim any bushes or trees on the path to your home. Use bright outdoor lighting. Clear any walking paths of anything that might make someone trip, such as rocks or tools. Regularly check to see if handrails are loose or broken. Make sure that both sides of any steps have handrails. Any raised decks and porches should have guardrails on the edges. Have any leaves, snow, or ice cleared regularly. Use sand or salt on walking paths during winter. Clean up any spills in your garage right away. This includes oil or grease spills. What can I do in the bathroom? Use night lights. Install grab bars by the toilet and in the tub and shower. Do not use towel bars as grab bars. Use non-skid mats or decals in the tub or shower. If you need to sit down in the shower, use a plastic, non-slip stool. Keep the floor dry. Clean up any water that spills on the floor as soon  as it happens. Remove soap buildup in the tub or shower regularly. Attach bath mats securely with double-sided non-slip rug tape. Do not have throw rugs and other things on the floor that can make you trip. What can I do in the bedroom? Use night lights. Make sure that you have a light by your bed that is easy to reach. Do not use any sheets or blankets that are too big for your bed. They should not hang down onto the floor. Have a firm chair that has side arms. You can use this for support while you get dressed. Do not have throw rugs and other things on the floor that can make you trip. What can I do in the kitchen? Clean up any spills right away. Avoid walking on wet floors. Keep items that you use a lot in easy-to-reach places. If you need to reach something above you, use a strong step stool that has a grab bar. Keep electrical cords out of the way. Do not use floor polish or wax that makes floors slippery. If you must use wax, use non-skid floor wax. Do not have throw rugs and other things  on the floor that can make you trip. What can I do with my stairs? Do not leave any items on the stairs. Make sure that there are handrails on both sides of the stairs and use them. Fix handrails that are broken or loose. Make sure that handrails are as long as the stairways. Check any carpeting to make sure that it is firmly attached to the stairs. Fix any carpet that is loose or worn. Avoid having throw rugs at the top or bottom of the stairs. If you do have throw rugs, attach them to the floor with carpet tape. Make sure that you have a light switch at the top of the stairs and the bottom of the stairs. If you do not have them, ask someone to add them for you. What else can I do to help prevent falls? Wear shoes that: Do not have high heels. Have rubber bottoms. Are comfortable and fit you well. Are closed at the toe. Do not wear sandals. If you use a stepladder: Make sure that it is fully  opened. Do not climb a closed stepladder. Make sure that both sides of the stepladder are locked into place. Ask someone to hold it for you, if possible. Clearly mark and make sure that you can see: Any grab bars or handrails. First and last steps. Where the edge of each step is. Use tools that help you move around (mobility aids) if they are needed. These include: Canes. Walkers. Scooters. Crutches. Turn on the lights when you go into a dark area. Replace any light bulbs as soon as they burn out. Set up your furniture so you have a clear path. Avoid moving your furniture around. If any of your floors are uneven, fix them. If there are any pets around you, be aware of where they are. Review your medicines with your doctor. Some medicines can make you feel dizzy. This can increase your chance of falling. Ask your doctor what other things that you can do to help prevent falls. This information is not intended to replace advice given to you by your health care provider. Make sure you discuss any questions you have with your health care provider. Document Released: 04/19/2009 Document Revised: 11/29/2015 Document Reviewed: 07/28/2014 Elsevier Interactive Patient Education  2017 [employer].

## 2022-06-02 ENCOUNTER — Emergency Department (HOSPITAL_COMMUNITY): Payer: Medicare Other

## 2022-06-02 ENCOUNTER — Inpatient Hospital Stay (HOSPITAL_COMMUNITY)
Admission: EM | Admit: 2022-06-02 | Discharge: 2022-06-07 | DRG: 314 | Disposition: A | Discharge status: Home / Self Care | Payer: Medicare Other | Attending: Internal Medicine | Admitting: Internal Medicine

## 2022-06-02 ENCOUNTER — Other Ambulatory Visit: Payer: Self-pay

## 2022-06-02 ENCOUNTER — Inpatient Hospital Stay (HOSPITAL_COMMUNITY): Payer: Medicare Other

## 2022-06-02 ENCOUNTER — Ambulatory Visit: Payer: Medicare Other | Attending: Cardiology

## 2022-06-02 ENCOUNTER — Telehealth: Payer: Self-pay | Admitting: *Deleted

## 2022-06-02 DIAGNOSIS — K51919 Ulcerative colitis, unspecified with unspecified complications: Secondary | ICD-10-CM | POA: Diagnosis not present

## 2022-06-02 DIAGNOSIS — Z8616 Personal history of COVID-19: Secondary | ICD-10-CM

## 2022-06-02 DIAGNOSIS — H409 Unspecified glaucoma: Secondary | ICD-10-CM | POA: Diagnosis present

## 2022-06-02 DIAGNOSIS — K5190 Ulcerative colitis, unspecified, without complications: Secondary | ICD-10-CM | POA: Diagnosis present

## 2022-06-02 DIAGNOSIS — Z932 Ileostomy status: Secondary | ICD-10-CM

## 2022-06-02 DIAGNOSIS — R6520 Severe sepsis without septic shock: Secondary | ICD-10-CM | POA: Diagnosis present

## 2022-06-02 DIAGNOSIS — Z8673 Personal history of transient ischemic attack (TIA), and cerebral infarction without residual deficits: Secondary | ICD-10-CM

## 2022-06-02 DIAGNOSIS — N186 End stage renal disease: Secondary | ICD-10-CM | POA: Diagnosis present

## 2022-06-02 DIAGNOSIS — D631 Anemia in chronic kidney disease: Secondary | ICD-10-CM | POA: Diagnosis present

## 2022-06-02 DIAGNOSIS — A411 Sepsis due to other specified staphylococcus: Secondary | ICD-10-CM | POA: Diagnosis present

## 2022-06-02 DIAGNOSIS — I4891 Unspecified atrial fibrillation: Secondary | ICD-10-CM | POA: Diagnosis present

## 2022-06-02 DIAGNOSIS — Z8719 Personal history of other diseases of the digestive system: Secondary | ICD-10-CM

## 2022-06-02 DIAGNOSIS — T80211A Bloodstream infection due to central venous catheter, initial encounter: Principal | ICD-10-CM | POA: Diagnosis present

## 2022-06-02 DIAGNOSIS — G9341 Metabolic encephalopathy: Secondary | ICD-10-CM | POA: Diagnosis present

## 2022-06-02 DIAGNOSIS — I739 Peripheral vascular disease, unspecified: Secondary | ICD-10-CM | POA: Diagnosis present

## 2022-06-02 DIAGNOSIS — N179 Acute kidney failure, unspecified: Secondary | ICD-10-CM | POA: Diagnosis present

## 2022-06-02 DIAGNOSIS — E871 Hypo-osmolality and hyponatremia: Secondary | ICD-10-CM | POA: Diagnosis not present

## 2022-06-02 DIAGNOSIS — Z7901 Long term (current) use of anticoagulants: Secondary | ICD-10-CM

## 2022-06-02 DIAGNOSIS — Z823 Family history of stroke: Secondary | ICD-10-CM

## 2022-06-02 DIAGNOSIS — Y848 Other medical procedures as the cause of abnormal reaction of the patient, or of later complication, without mention of misadventure at the time of the procedure: Secondary | ICD-10-CM | POA: Diagnosis present

## 2022-06-02 DIAGNOSIS — T827XXS Infection and inflammatory reaction due to other cardiac and vascular devices, implants and grafts, sequela: Secondary | ICD-10-CM

## 2022-06-02 DIAGNOSIS — I4719 Other supraventricular tachycardia: Secondary | ICD-10-CM | POA: Diagnosis present

## 2022-06-02 DIAGNOSIS — I440 Atrioventricular block, first degree: Secondary | ICD-10-CM | POA: Diagnosis present

## 2022-06-02 DIAGNOSIS — N138 Other obstructive and reflux uropathy: Secondary | ICD-10-CM | POA: Diagnosis present

## 2022-06-02 DIAGNOSIS — R7881 Bacteremia: Secondary | ICD-10-CM

## 2022-06-02 DIAGNOSIS — T827XXD Infection and inflammatory reaction due to other cardiac and vascular devices, implants and grafts, subsequent encounter: Secondary | ICD-10-CM | POA: Diagnosis not present

## 2022-06-02 DIAGNOSIS — Z992 Dependence on renal dialysis: Secondary | ICD-10-CM | POA: Diagnosis not present

## 2022-06-02 DIAGNOSIS — A419 Sepsis, unspecified organism: Secondary | ICD-10-CM | POA: Diagnosis present

## 2022-06-02 DIAGNOSIS — Z1152 Encounter for screening for COVID-19: Secondary | ICD-10-CM

## 2022-06-02 DIAGNOSIS — Z79899 Other long term (current) drug therapy: Secondary | ICD-10-CM

## 2022-06-02 DIAGNOSIS — R0609 Other forms of dyspnea: Secondary | ICD-10-CM | POA: Diagnosis present

## 2022-06-02 DIAGNOSIS — B957 Other staphylococcus as the cause of diseases classified elsewhere: Secondary | ICD-10-CM | POA: Diagnosis not present

## 2022-06-02 DIAGNOSIS — M898X9 Other specified disorders of bone, unspecified site: Secondary | ICD-10-CM | POA: Diagnosis present

## 2022-06-02 DIAGNOSIS — E875 Hyperkalemia: Secondary | ICD-10-CM | POA: Diagnosis present

## 2022-06-02 DIAGNOSIS — I9589 Other hypotension: Secondary | ICD-10-CM | POA: Diagnosis present

## 2022-06-02 DIAGNOSIS — I4819 Other persistent atrial fibrillation: Secondary | ICD-10-CM | POA: Diagnosis present

## 2022-06-02 DIAGNOSIS — Z800 Family history of malignant neoplasm of digestive organs: Secondary | ICD-10-CM

## 2022-06-02 DIAGNOSIS — Z888 Allergy status to other drugs, medicaments and biological substances status: Secondary | ICD-10-CM

## 2022-06-02 DIAGNOSIS — I480 Paroxysmal atrial fibrillation: Secondary | ICD-10-CM | POA: Diagnosis present

## 2022-06-02 DIAGNOSIS — Z87891 Personal history of nicotine dependence: Secondary | ICD-10-CM

## 2022-06-02 DIAGNOSIS — G609 Hereditary and idiopathic neuropathy, unspecified: Secondary | ICD-10-CM | POA: Diagnosis present

## 2022-06-02 DIAGNOSIS — Z881 Allergy status to other antibiotic agents status: Secondary | ICD-10-CM

## 2022-06-02 DIAGNOSIS — G9340 Encephalopathy, unspecified: Secondary | ICD-10-CM | POA: Diagnosis not present

## 2022-06-02 DIAGNOSIS — R54 Age-related physical debility: Secondary | ICD-10-CM | POA: Diagnosis present

## 2022-06-02 DIAGNOSIS — Z8619 Personal history of other infectious and parasitic diseases: Secondary | ICD-10-CM | POA: Diagnosis present

## 2022-06-02 DIAGNOSIS — N185 Chronic kidney disease, stage 5: Secondary | ICD-10-CM | POA: Diagnosis not present

## 2022-06-02 DIAGNOSIS — Z86718 Personal history of other venous thrombosis and embolism: Secondary | ICD-10-CM

## 2022-06-02 DIAGNOSIS — Z85828 Personal history of other malignant neoplasm of skin: Secondary | ICD-10-CM

## 2022-06-02 DIAGNOSIS — T82898A Other specified complication of vascular prosthetic devices, implants and grafts, initial encounter: Secondary | ICD-10-CM | POA: Diagnosis not present

## 2022-06-02 DIAGNOSIS — Z9049 Acquired absence of other specified parts of digestive tract: Secondary | ICD-10-CM

## 2022-06-02 LAB — COMPREHENSIVE METABOLIC PANEL
ALT: 43 U/L (ref 0–44)
AST: 49 U/L — ABNORMAL HIGH (ref 15–41)
Albumin: 3.5 g/dL (ref 3.5–5.0)
Alkaline Phosphatase: 52 U/L (ref 38–126)
Anion gap: 16 — ABNORMAL HIGH (ref 5–15)
BUN: 35 mg/dL — ABNORMAL HIGH (ref 8–23)
CO2: 21 mmol/L — ABNORMAL LOW (ref 22–32)
Calcium: 8.7 mg/dL — ABNORMAL LOW (ref 8.9–10.3)
Chloride: 98 mmol/L (ref 98–111)
Creatinine, Ser: 7.59 mg/dL — ABNORMAL HIGH (ref 0.61–1.24)
GFR, Estimated: 7 mL/min — ABNORMAL LOW (ref >60)
Glucose, Bld: 112 mg/dL — ABNORMAL HIGH (ref 70–99)
Potassium: 4.3 mmol/L (ref 3.5–5.1)
Sodium: 135 mmol/L (ref 135–145)
Total Bilirubin: 1.3 mg/dL — ABNORMAL HIGH (ref 0.3–1.2)
Total Protein: 6.7 g/dL (ref 6.5–8.1)

## 2022-06-02 LAB — CBC WITH DIFFERENTIAL/PLATELET
Abs Immature Granulocytes: 0.04 10*3/uL (ref 0.00–0.07)
Basophils Absolute: 0 10*3/uL (ref 0.0–0.1)
Basophils Relative: 0 %
Eosinophils Absolute: 0 10*3/uL (ref 0.0–0.5)
Eosinophils Relative: 0 %
HCT: 27 % — ABNORMAL LOW (ref 39.0–52.0)
Hemoglobin: 8.9 g/dL — ABNORMAL LOW (ref 13.0–17.0)
Immature Granulocytes: 1 %
Lymphocytes Relative: 6 %
Lymphs Abs: 0.4 10*3/uL — ABNORMAL LOW (ref 0.7–4.0)
MCH: 40.3 pg — ABNORMAL HIGH (ref 26.0–34.0)
MCHC: 33 g/dL (ref 30.0–36.0)
MCV: 122.2 fL — ABNORMAL HIGH (ref 80.0–100.0)
Monocytes Absolute: 0.6 10*3/uL (ref 0.1–1.0)
Monocytes Relative: 8 %
Neutro Abs: 6.6 10*3/uL (ref 1.7–7.7)
Neutrophils Relative %: 85 %
Platelets: 93 10*3/uL — ABNORMAL LOW (ref 150–400)
RBC: 2.21 MIL/uL — ABNORMAL LOW (ref 4.22–5.81)
RDW: 15.7 % — ABNORMAL HIGH (ref 11.5–15.5)
WBC: 7.8 10*3/uL (ref 4.0–10.5)
nRBC: 0 % (ref 0.0–0.2)

## 2022-06-02 LAB — RESPIRATORY PANEL BY PCR

## 2022-06-02 LAB — URINALYSIS, ROUTINE W REFLEX MICROSCOPIC

## 2022-06-02 LAB — URINALYSIS, MICROSCOPIC (REFLEX)
RBC / HPF: NONE SEEN RBC/hpf (ref 0–5)
Squamous Epithelial / HPF: NONE SEEN (ref 0–5)
WBC, UA: >50 WBC/hpf (ref 0–5)

## 2022-06-02 LAB — RESP PANEL BY RT-PCR (FLU A&B, COVID) ARPGX2
Influenza A by PCR: NEGATIVE
Influenza B by PCR: NEGATIVE
SARS Coronavirus 2 by RT PCR: NEGATIVE

## 2022-06-02 LAB — PROTIME-INR
INR: 1.3 — ABNORMAL HIGH (ref 0.8–1.2)
Prothrombin Time: 16.1 seconds — ABNORMAL HIGH (ref 11.4–15.2)

## 2022-06-02 LAB — LACTIC ACID, PLASMA
Lactic Acid, Venous: 2.5 mmol/L (ref 0.5–1.9)
Lactic Acid, Venous: 1.9 mmol/L (ref 0.5–1.9)

## 2022-06-02 MED ORDER — SODIUM CHLORIDE 0.9 % IV SOLN
1.0000 g | INTRAVENOUS | Status: DC
  Administered 2022-06-03: 1 g via INTRAVENOUS
  Filled 2022-06-02 (×2): qty 10

## 2022-06-02 MED ORDER — SEVELAMER CARBONATE 800 MG PO TABS
2400.0000 mg | ORAL_TABLET | Freq: Three times a day (TID) | ORAL | Status: DC
  Administered 2022-06-02 – 2022-06-07 (×12): 2400 mg via ORAL
  Filled 2022-06-02 (×11): qty 3

## 2022-06-02 MED ORDER — TIMOLOL MALEATE 0.5 % OP SOLN
1.0000 [drp] | Freq: Every day | OPHTHALMIC | Status: DC
  Administered 2022-06-03 – 2022-06-07 (×5): 1 [drp] via OPHTHALMIC
  Filled 2022-06-02: qty 5

## 2022-06-02 MED ORDER — OXYCODONE-ACETAMINOPHEN 5-325 MG PO TABS
1.0000 | ORAL_TABLET | Freq: Four times a day (QID) | ORAL | Status: DC | PRN
  Administered 2022-06-02 – 2022-06-06 (×3): 1 via ORAL
  Filled 2022-06-02 (×3): qty 1

## 2022-06-02 MED ORDER — MIDODRINE HCL 5 MG PO TABS
15.0000 mg | ORAL_TABLET | Freq: Three times a day (TID) | ORAL | Status: DC
  Administered 2022-06-03 – 2022-06-07 (×14): 15 mg via ORAL
  Filled 2022-06-02 (×13): qty 3

## 2022-06-02 MED ORDER — SODIUM CHLORIDE 0.9 % IV SOLN
2.0000 g | Freq: Once | INTRAVENOUS | Status: AC
  Administered 2022-06-02: 2 g via INTRAVENOUS
  Filled 2022-06-02: qty 10

## 2022-06-02 MED ORDER — SODIUM ZIRCONIUM CYCLOSILICATE 10 G PO PACK
10.0000 g | PACK | ORAL | Status: DC
  Administered 2022-06-03 – 2022-06-05 (×2): 10 g via ORAL
  Filled 2022-06-02 (×2): qty 1

## 2022-06-02 MED ORDER — MIDODRINE HCL 5 MG PO TABS
5.0000 mg | ORAL_TABLET | Freq: Once | ORAL | Status: AC
  Administered 2022-06-02: 5 mg via ORAL
  Filled 2022-06-02: qty 1

## 2022-06-02 MED ORDER — PREGABALIN 25 MG PO CAPS
50.0000 mg | ORAL_CAPSULE | Freq: Every day | ORAL | Status: DC
  Administered 2022-06-02 – 2022-06-07 (×6): 50 mg via ORAL
  Filled 2022-06-02 (×6): qty 2

## 2022-06-02 MED ORDER — APIXABAN 2.5 MG PO TABS
2.5000 mg | ORAL_TABLET | Freq: Two times a day (BID) | ORAL | Status: DC
  Administered 2022-06-02 – 2022-06-07 (×10): 2.5 mg via ORAL
  Filled 2022-06-02 (×11): qty 1

## 2022-06-02 MED ORDER — VANCOMYCIN HCL 750 MG/150ML IV SOLN
750.0000 mg | INTRAVENOUS | Status: DC
  Administered 2022-06-04 – 2022-06-06 (×2): 750 mg via INTRAVENOUS
  Filled 2022-06-02 (×2): qty 150

## 2022-06-02 MED ORDER — VANCOMYCIN HCL 1750 MG/350ML IV SOLN
1750.0000 mg | Freq: Once | INTRAVENOUS | Status: AC
  Administered 2022-06-02: 1750 mg via INTRAVENOUS
  Filled 2022-06-02: qty 350

## 2022-06-02 MED ORDER — ACETAMINOPHEN 325 MG PO TABS
650.0000 mg | ORAL_TABLET | Freq: Once | ORAL | Status: AC
  Administered 2022-06-02: 650 mg via ORAL
  Filled 2022-06-02: qty 2

## 2022-06-02 MED ORDER — LACTATED RINGERS IV SOLN: INTRAVENOUS | Status: DC

## 2022-06-02 MED ORDER — ALBUTEROL SULFATE (2.5 MG/3ML) 0.083% IN NEBU: 2.5000 mg | INHALATION_SOLUTION | Freq: Four times a day (QID) | RESPIRATORY_TRACT | Status: DC | PRN

## 2022-06-02 MED ORDER — ACETAMINOPHEN 650 MG RE SUPP: 650.0000 mg | Freq: Four times a day (QID) | RECTAL | Status: DC | PRN

## 2022-06-02 MED ORDER — SODIUM CHLORIDE 0.9% FLUSH
3.0000 mL | Freq: Two times a day (BID) | INTRAVENOUS | Status: DC
  Administered 2022-06-02 – 2022-06-07 (×10): 3 mL via INTRAVENOUS

## 2022-06-02 MED ORDER — LATANOPROST 0.005 % OP SOLN
1.0000 [drp] | Freq: Every day | OPHTHALMIC | Status: DC
  Administered 2022-06-04 – 2022-06-06 (×3): 1 [drp] via OPHTHALMIC
  Filled 2022-06-02 (×2): qty 2.5

## 2022-06-02 MED ORDER — ACETAMINOPHEN 325 MG PO TABS
650.0000 mg | ORAL_TABLET | Freq: Four times a day (QID) | ORAL | Status: DC | PRN
  Administered 2022-06-03: 650 mg via ORAL
  Filled 2022-06-02: qty 2

## 2022-06-02 MED ORDER — MIDODRINE HCL 5 MG PO TABS
10.0000 mg | ORAL_TABLET | Freq: Once | ORAL | Status: AC
  Administered 2022-06-02: 10 mg via ORAL
  Filled 2022-06-02: qty 2

## 2022-06-02 MED ORDER — VANCOMYCIN HCL IN DEXTROSE 1-5 GM/200ML-% IV SOLN: 1000.0000 mg | Freq: Once | INTRAVENOUS | Status: DC

## 2022-06-02 MED ORDER — METRONIDAZOLE 500 MG/100ML IV SOLN
500.0000 mg | Freq: Once | INTRAVENOUS | Status: DC
  Filled 2022-06-02: qty 100
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
## 2022-06-02 NOTE — Progress Notes (Signed)
Notified provider and bedside nurse of need to order and administer fluid bolus, pt needs 2298 cc.
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
## 2022-06-02 NOTE — Progress Notes (Signed)
Notified bedside nurse of need to draw repeat lactic acid. 

## 2022-06-02 NOTE — Telephone Encounter (Signed)
Debroah Baller advised Dr Johney Frame recommended delay cardiac cath for now-advised I will cancel cardiac cath scheduled 06/03/22.

## 2022-06-02 NOTE — Telephone Encounter (Signed)
Bader, [place] - 06/02/2022 10:21 AM Freada Bergeron, MD  Sent: Mon June 02, 2022  2:20 PM  To: Katrine Coho, RN; Nuala Alpha, LPN         Message  Thank you for keeping [place] posted! Hope he feels better!
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
## 2022-06-02 NOTE — Progress Notes (Signed)
Pharmacy Antibiotic Note  Joshua Riley is a 86 y.o. male admitted on 06/02/2022 with sepsis.  Pharmacy has been consulted for vancomycin and cefepime dosing.  Patient presenting with fever and confusion following a dialysis session this morning. Has history of ESRD on HD MWF and was hospitalized in June of 2023 for central venous catheter-associated ecoli bacteremia. On presentation, patient is febrile to 103 with no leukocytosis. Team would like to start broad spectrum antibiotics for sepsis. Blood cultures have been collected.  Patient initially given one dose of aztreonam due to documented cephalosporin allergy, however, tolerated cefepime during hospitalization in June 2023. Per MD, will switch antibiotics to vancomycin and cefepime.  Plan: Give vancomycin 1750 mg IV x1 Start vancomycin 750 mg IV with dialysis on dialysis days Target vancomycin level of 15-25 mcg/mL Start cefepime 1g IV q24 hours Monitor clinical status, culture data, and LOT Follow-up dialysis scheduled while admitted for adjusting doses   Temp (24hrs), Avg:102.9 F (39.4 C), Min:102.9 F (39.4 C), Max:102.9 F (39.4 C)  Recent Labs  Lab 06/02/22 1215  WBC 7.8  CREATININE 7.59*  LATICACIDVEN 2.5*    Estimated Creatinine Clearance: 7.3 mL/min (A) (by C-G formula based on SCr of 7.59 mg/dL (H)).    Allergies  Allergen Reactions   Cephalosporins Rash   Ciprofloxacin Itching and Rash   Baclofen Other (See Comments)    Altered mental status, after accidental overdose     Antimicrobials this admission: Aztreonam 11/27 x1 Vancomycin 11/27 >> Cefepime 11/27 >>  Dose adjustments this admission: N/A  Microbiology results: 11/27 Bcx:   Thank you for allowing pharmacy to be a part of this patient's care.  Louanne Belton, PharmD, [facility] PGY1 Pharmacy Resident 06/02/2022 3:03 PM

## 2022-06-02 NOTE — Telephone Encounter (Signed)
Call placed to patient to review Daniels instructions for 06/03/22. Patient reports he took Eliquis this morning about 5 AM-he reports he forgot to stop it yesterday as he was instructed.  Patient advised I will forward to Dr Johney Frame  for review and recommendations about proceeding with cath tomorrow since cath is scheduled for 7:30 AM tomorrow and patient took last dose of Eliquis this morning about 5 AM.

## 2022-06-02 NOTE — Progress Notes (Signed)
Elink following code sepsis °

## 2022-06-02 NOTE — Progress Notes (Signed)
Notified bedside nurse (EMT) of need to draw repeat lactic acid.

## 2022-06-03 ENCOUNTER — Encounter (HOSPITAL_COMMUNITY): Admission: RE | Payer: Medicare Other | Source: Home / Self Care

## 2022-06-03 ENCOUNTER — Ambulatory Visit (HOSPITAL_COMMUNITY): Admission: RE | Admit: 2022-06-03 | Payer: Medicare Other | Source: Home / Self Care | Admitting: Cardiology

## 2022-06-03 ENCOUNTER — Encounter (HOSPITAL_COMMUNITY): Payer: Self-pay | Admitting: Internal Medicine

## 2022-06-03 DIAGNOSIS — G9341 Metabolic encephalopathy: Secondary | ICD-10-CM | POA: Diagnosis not present

## 2022-06-03 DIAGNOSIS — A419 Sepsis, unspecified organism: Secondary | ICD-10-CM | POA: Diagnosis not present

## 2022-06-03 DIAGNOSIS — I4819 Other persistent atrial fibrillation: Secondary | ICD-10-CM | POA: Diagnosis not present

## 2022-06-03 DIAGNOSIS — N186 End stage renal disease: Secondary | ICD-10-CM | POA: Diagnosis not present

## 2022-06-03 LAB — BLOOD CULTURE ID PANEL (REFLEXED) - BCID2

## 2022-06-03 LAB — RENAL FUNCTION PANEL
Albumin: 2.9 g/dL — ABNORMAL LOW (ref 3.5–5.0)
Anion gap: 12 (ref 5–15)
BUN: 52 mg/dL — ABNORMAL HIGH (ref 8–23)
CO2: 23 mmol/L (ref 22–32)
Calcium: 8.3 mg/dL — ABNORMAL LOW (ref 8.9–10.3)
Chloride: 97 mmol/L — ABNORMAL LOW (ref 98–111)
Creatinine, Ser: 9.12 mg/dL — ABNORMAL HIGH (ref 0.61–1.24)
GFR, Estimated: 5 mL/min — ABNORMAL LOW (ref >60)
Glucose, Bld: 105 mg/dL — ABNORMAL HIGH (ref 70–99)
Phosphorus: 6 mg/dL — ABNORMAL HIGH (ref 2.5–4.6)
Potassium: 4.8 mmol/L (ref 3.5–5.1)
Sodium: 132 mmol/L — ABNORMAL LOW (ref 135–145)

## 2022-06-03 LAB — CBC
HCT: 25.6 % — ABNORMAL LOW (ref 39.0–52.0)
Hemoglobin: 8.1 g/dL — ABNORMAL LOW (ref 13.0–17.0)
MCH: 39.3 pg — ABNORMAL HIGH (ref 26.0–34.0)
MCHC: 31.6 g/dL (ref 30.0–36.0)
MCV: 124.3 fL — ABNORMAL HIGH (ref 80.0–100.0)
Platelets: 87 10*3/uL — ABNORMAL LOW (ref 150–400)
RBC: 2.06 MIL/uL — ABNORMAL LOW (ref 4.22–5.81)
RDW: 15.8 % — ABNORMAL HIGH (ref 11.5–15.5)
WBC: 6 10*3/uL (ref 4.0–10.5)
nRBC: 0 % (ref 0.0–0.2)

## 2022-06-03 LAB — HEPATITIS B SURFACE ANTIGEN: Hepatitis B Surface Ag: NONREACTIVE

## 2022-06-03 LAB — PROCALCITONIN: Procalcitonin: 18.79 ng/mL

## 2022-06-03 LAB — PHOSPHORUS: Phosphorus: 6.1 mg/dL — ABNORMAL HIGH (ref 2.5–4.6)

## 2022-06-03 SURGERY — LEFT HEART CATH AND CORONARY ANGIOGRAPHY
Anesthesia: LOCAL

## 2022-06-03 MED ORDER — CALCITRIOL 0.25 MCG PO CAPS
1.2500 ug | ORAL_CAPSULE | ORAL | Status: DC
  Administered 2022-06-04 – 2022-06-06 (×2): 1.25 ug via ORAL
  Filled 2022-06-03 (×2): qty 5

## 2022-06-03 MED ORDER — CHLORHEXIDINE GLUCONATE CLOTH 2 % EX PADS
6.0000 | MEDICATED_PAD | Freq: Every day | CUTANEOUS | Status: DC
  Administered 2022-06-04 – 2022-06-06 (×3): 6 via TOPICAL
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
## 2022-06-03 NOTE — ED Notes (Signed)
This paramedic observed patient walking to the bathroom. Patient complaining of right heel pain with walking. Patient has a mild limp with the pain. Patient socks are white and a small amount of blood was noted to be on the right heel of his sock. Once patient returned from the bathroom, patients sock was removed and right heel was assessed. Patient had an approximate 2 inch wound that looked comparable to an old laceration that may have been reopened. Patient complains of pain on palpation. MD Bonner Puna made aware at this time.
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
## 2022-06-03 NOTE — Progress Notes (Signed)
PHARMACY - PHYSICIAN COMMUNICATION CRITICAL VALUE ALERT - BLOOD CULTURE IDENTIFICATION (BCID)  Joshua Riley is an 86 y.o. male who presented to [facility] on 06/02/2022 with a chief complaint of sepsis.   Assessment:  1/2 aerobic bottles gram positive cocci in clusters growing staph species   Name of physician (or Provider) Contacted: Dr. Vance Gather   Current antibiotics: cefepime, metronidazole, vancomycin   Changes to prescribed antibiotics recommended:  Suspect possible contaminant. No changes required as antibiotics covering staph.   Results for orders placed or performed during the hospital encounter of 06/02/22  Blood Culture ID Panel (Reflexed) (Collected: 06/02/2022 12:21 PM)  Result Value Ref Range   Enterococcus faecalis NOT DETECTED NOT DETECTED   Enterococcus Faecium NOT DETECTED NOT DETECTED   Listeria monocytogenes NOT DETECTED NOT DETECTED   Staphylococcus species DETECTED (A) NOT DETECTED   Staphylococcus aureus (BCID) NOT DETECTED NOT DETECTED   Staphylococcus epidermidis NOT DETECTED NOT DETECTED   Staphylococcus lugdunensis NOT DETECTED NOT DETECTED   Streptococcus species NOT DETECTED NOT DETECTED   Streptococcus agalactiae NOT DETECTED NOT DETECTED   Streptococcus pneumoniae NOT DETECTED NOT DETECTED   Streptococcus pyogenes NOT DETECTED NOT DETECTED   A.calcoaceticus-baumannii NOT DETECTED NOT DETECTED   Bacteroides fragilis NOT DETECTED NOT DETECTED   Enterobacterales NOT DETECTED NOT DETECTED   Enterobacter cloacae complex NOT DETECTED NOT DETECTED   Escherichia coli NOT DETECTED NOT DETECTED   Klebsiella aerogenes NOT DETECTED NOT DETECTED   Klebsiella oxytoca NOT DETECTED NOT DETECTED   Klebsiella pneumoniae NOT DETECTED NOT DETECTED   Proteus species NOT DETECTED NOT DETECTED   Salmonella species NOT DETECTED NOT DETECTED   Serratia marcescens NOT DETECTED NOT DETECTED   Haemophilus influenzae NOT DETECTED NOT DETECTED   Neisseria meningitidis  NOT DETECTED NOT DETECTED   Pseudomonas aeruginosa NOT DETECTED NOT DETECTED   Stenotrophomonas maltophilia NOT DETECTED NOT DETECTED   Candida albicans NOT DETECTED NOT DETECTED   Candida auris NOT DETECTED NOT DETECTED   Candida glabrata NOT DETECTED NOT DETECTED   Candida krusei NOT DETECTED NOT DETECTED   Candida parapsilosis NOT DETECTED NOT DETECTED   Candida tropicalis NOT DETECTED NOT DETECTED   Cryptococcus neoformans/gattii NOT DETECTED NOT DETECTED     Gena Fray, PharmD PGY1 Pharmacy Resident   06/03/2022 11:23 AM

## 2022-06-04 DIAGNOSIS — B957 Other staphylococcus as the cause of diseases classified elsewhere: Secondary | ICD-10-CM | POA: Diagnosis not present

## 2022-06-04 DIAGNOSIS — N186 End stage renal disease: Secondary | ICD-10-CM | POA: Diagnosis not present

## 2022-06-04 DIAGNOSIS — T827XXD Infection and inflammatory reaction due to other cardiac and vascular devices, implants and grafts, subsequent encounter: Secondary | ICD-10-CM | POA: Diagnosis not present

## 2022-06-04 DIAGNOSIS — Z992 Dependence on renal dialysis: Secondary | ICD-10-CM | POA: Diagnosis not present

## 2022-06-04 DIAGNOSIS — G9340 Encephalopathy, unspecified: Secondary | ICD-10-CM | POA: Diagnosis not present

## 2022-06-04 DIAGNOSIS — T82898A Other specified complication of vascular prosthetic devices, implants and grafts, initial encounter: Secondary | ICD-10-CM

## 2022-06-04 DIAGNOSIS — R6520 Severe sepsis without septic shock: Secondary | ICD-10-CM | POA: Diagnosis not present

## 2022-06-04 DIAGNOSIS — R7881 Bacteremia: Secondary | ICD-10-CM | POA: Diagnosis not present

## 2022-06-04 DIAGNOSIS — N185 Chronic kidney disease, stage 5: Secondary | ICD-10-CM

## 2022-06-04 DIAGNOSIS — A419 Sepsis, unspecified organism: Secondary | ICD-10-CM | POA: Diagnosis not present

## 2022-06-04 LAB — LIPID PANEL
Cholesterol: 84 mg/dL (ref 0–200)
HDL: 36 mg/dL — ABNORMAL LOW (ref >40)
LDL Cholesterol: 23 mg/dL (ref 0–99)
Total CHOL/HDL Ratio: 2.3 RATIO
Triglycerides: 125 mg/dL (ref <150)
VLDL: 25 mg/dL (ref 0–40)

## 2022-06-04 LAB — CBC WITH DIFFERENTIAL/PLATELET
Abs Immature Granulocytes: 0.03 10*3/uL (ref 0.00–0.07)
Basophils Absolute: 0 10*3/uL (ref 0.0–0.1)
Basophils Relative: 0 %
Eosinophils Absolute: 0.1 10*3/uL (ref 0.0–0.5)
Eosinophils Relative: 2 %
HCT: 25.2 % — ABNORMAL LOW (ref 39.0–52.0)
Hemoglobin: 8.2 g/dL — ABNORMAL LOW (ref 13.0–17.0)
Immature Granulocytes: 0 %
Lymphocytes Relative: 13 %
Lymphs Abs: 0.9 10*3/uL (ref 0.7–4.0)
MCH: 39.6 pg — ABNORMAL HIGH (ref 26.0–34.0)
MCHC: 32.5 g/dL (ref 30.0–36.0)
MCV: 121.7 fL — ABNORMAL HIGH (ref 80.0–100.0)
Monocytes Absolute: 0.7 10*3/uL (ref 0.1–1.0)
Monocytes Relative: 9 %
Neutro Abs: 5.4 10*3/uL (ref 1.7–7.7)
Neutrophils Relative %: 76 %
Platelets: 99 10*3/uL — ABNORMAL LOW (ref 150–400)
RBC: 2.07 MIL/uL — ABNORMAL LOW (ref 4.22–5.81)
RDW: 15.6 % — ABNORMAL HIGH (ref 11.5–15.5)
WBC: 7.1 10*3/uL (ref 4.0–10.5)
nRBC: 0 % (ref 0.0–0.2)

## 2022-06-04 LAB — RENAL FUNCTION PANEL
Albumin: 2.9 g/dL — ABNORMAL LOW (ref 3.5–5.0)
Anion gap: 15 (ref 5–15)
BUN: 67 mg/dL — ABNORMAL HIGH (ref 8–23)
CO2: 18 mmol/L — ABNORMAL LOW (ref 22–32)
Calcium: 7.9 mg/dL — ABNORMAL LOW (ref 8.9–10.3)
Chloride: 99 mmol/L (ref 98–111)
Creatinine, Ser: 10.77 mg/dL — ABNORMAL HIGH (ref 0.61–1.24)
GFR, Estimated: 4 mL/min — ABNORMAL LOW (ref >60)
Glucose, Bld: 96 mg/dL (ref 70–99)
Phosphorus: 5.7 mg/dL — ABNORMAL HIGH (ref 2.5–4.6)
Potassium: 4.4 mmol/L (ref 3.5–5.1)
Sodium: 132 mmol/L — ABNORMAL LOW (ref 135–145)

## 2022-06-04 LAB — PROCALCITONIN: Procalcitonin: 18.12 ng/mL

## 2022-06-04 MED ORDER — HEPARIN SODIUM (PORCINE) 1000 UNIT/ML DIALYSIS: 2000.0000 [IU] | INTRAMUSCULAR | Status: DC | PRN

## 2022-06-04 MED ORDER — ALTEPLASE 2 MG IJ SOLR: 2.0000 mg | Freq: Once | INTRAMUSCULAR | Status: DC | PRN

## 2022-06-04 MED ORDER — ANTICOAGULANT SODIUM CITRATE 4% (200MG/5ML) IV SOLN: 5.0000 mL | Status: DC | PRN

## 2022-06-04 MED ORDER — HEPARIN SODIUM (PORCINE) 1000 UNIT/ML DIALYSIS
1000.0000 [IU] | INTRAMUSCULAR | Status: DC | PRN
  Administered 2022-06-04: 1000 [IU]
  Filled 2022-06-04: qty 1

## 2022-06-04 MED ORDER — HEPARIN SODIUM (PORCINE) 1000 UNIT/ML IJ SOLN
2500.0000 [IU] | INTRAMUSCULAR | Status: DC | PRN
  Administered 2022-06-04: 2500 [IU] via INTRAVENOUS
  Filled 2022-06-04: qty 3

## 2022-06-04 MED ORDER — LIDOCAINE HCL 1 % IJ SOLN
INTRAMUSCULAR | Status: AC
  Filled 2022-06-04: qty 20

## 2022-06-04 NOTE — Progress Notes (Signed)
[facility] Progress Note   Subjective:   Patient seen and examined at bedside in dialysis.  Tolerating treatment well so far.  Denies CP, SOB, abdominal pain, n/v/d, fevers and chills.   Discussed need to line holiday with positive blood cultures - patient agrees.   Per notes patient is 4 weeks out from AVG revision.  Patient reports he was due to follow up with [facility] tomorrow.  Reports no pain, redness, drainage or swelling from [facility] or AVG.   Objective Vitals:   06/04/22 0900 06/04/22 0930 06/04/22 1000 06/04/22 1030  BP: (!) 95/53 (!) 91/49 (!) 88/70 (!) 90/46  Pulse: (!) 54 (!) 112 (!) 55 91  Resp: (!) 21 15 20 14   Temp:      TempSrc:      SpO2: 99% 98% 94% 94%   Physical Exam General:WDWN male in NAD Heart:RRR, no mrg Lungs:CTAB, nml WOB on RA Abdomen:soft, NTND Extremities:no LE edema Dialysis Access: R fem TDC, L thigh AVG +b/t - no erythema/edema   There were no vitals filed for this visit.  Intake/Output Summary (Last 24 hours) at 06/04/2022 1108 Last data filed at 06/04/2022 0000 Gross per 24 hour  Intake 118.94 ml  Output --  Net 118.94 ml    Additional Objective Labs: Basic Metabolic Panel: Recent Labs  Lab 06/02/22 1215 06/03/22 0500 06/03/22 1910 06/04/22 0844  NA 135 132*  --  132*  K 4.3 4.8  --  4.4  CL 98 97*  --  99  CO2 21* 23  --  18*  GLUCOSE 112* 105*  --  96  BUN 35* 52*  --  67*  CREATININE 7.59* 9.12*  --  10.77*  CALCIUM 8.7* 8.3*  --  7.9*  PHOS  --  6.0* 6.1* 5.7*   Liver Function Tests: Recent Labs  Lab 06/02/22 1215 06/03/22 0500 06/04/22 0844  AST 49*  --   --   ALT 43  --   --   ALKPHOS 52  --   --   BILITOT 1.3*  --   --   PROT 6.7  --   --   ALBUMIN 3.5 2.9* 2.9*   CBC: Recent Labs  Lab 06/02/22 1215 06/03/22 0500 06/04/22 0844  WBC 7.8 6.0 7.1  NEUTROABS 6.6  --  5.4  HGB 8.9* 8.1* 8.2*  HCT 27.0* 25.6* 25.2*  MCV 122.2* 124.3* 121.7*  PLT 93* 87* 99*   Blood Culture    Component Value  Date/Time   SDES BLOOD RIGHT ANTECUBITAL 06/02/2022 1221   SPECREQUEST  06/02/2022 1221    BOTTLES DRAWN AEROBIC AND ANAEROBIC Blood Culture results may not be optimal due to an inadequate volume of blood received in culture bottles   CULT GRAM POSITIVE COCCI 06/02/2022 1221   REPTSTATUS PENDING 06/02/2022 1221    Studies/Results: CT Head Wo Contrast  Result Date: 06/02/2022 CLINICAL DATA:  Mental status change, unknown cause EXAM: CT HEAD WITHOUT CONTRAST TECHNIQUE: Contiguous axial images were obtained from the base of the skull through the vertex without intravenous contrast. RADIATION DOSE REDUCTION: This exam was performed according to the departmental dose-optimization program which includes automated exposure control, adjustment of the mA and/or kV according to patient size and/or use of iterative reconstruction technique. COMPARISON:  None Available. FINDINGS: Brain: No evidence of acute infarction, hemorrhage, hydrocephalus, extra-axial collection or mass lesion/mass effect. Mild age related cerebral volume loss. Vascular: No hyperdense vessel or unexpected calcification. Skull: Normal. Negative for fracture or focal lesion.  Sinuses/Orbits: Mucosal thickening of the bilateral maxillary sinuses and ethmoid air cells. Other: None. IMPRESSION: 1. No acute intracranial abnormality. 2. Mild paranasal sinus disease. Electronically Signed   By: Davina Poke D.O.   On: 06/02/2022 15:52   DG Chest 2 View  Result Date: 06/02/2022 CLINICAL DATA:  Fever.  Shortness of breath today. EXAM: CHEST - 2 VIEW COMPARISON:  None Available. FINDINGS: Cardiac silhouette is top-normal in size. No mediastinal or hilar masses. No evidence of lymphadenopathy. Femorally placed dual lumen central venous catheter tip projects in the lower superior vena cava. Right subclavian vascular stent. Clear lungs.  No pleural effusion or pneumothorax. Skeletal structures are intact. IMPRESSION: No active cardiopulmonary  disease. Electronically Signed   By: Lajean Manes M.D.   On: 06/02/2022 12:58    Medications:  anticoagulant sodium citrate     ceFEPime (MAXIPIME) IV Stopped (06/03/22 2018)   vancomycin      apixaban  2.5 mg Oral BID   calcitRIOL  1.25 mcg Oral Q M,W,F-HD   Chlorhexidine Gluconate Cloth  6 each Topical Q0600   latanoprost  1 drop Right Eye QHS   midodrine  15 mg Oral TID WC   pregabalin  50 mg Oral Daily   sevelamer carbonate  2,400 mg Oral TID WC   sodium chloride flush  3 mL Intravenous Q12H   sodium zirconium cyclosilicate  10 g Oral Once per day on Sun Tue Thu Sat   timolol  1 drop Both Eyes Daily    Dialysis Orders: MWF NW 2h 97mn  350/ 1.5   74.5kg   2/2.5 bath  Heparin 5000 - last HD 11/27, post wt 75.2 - rocaltrol 1.25 ug po tiw - mircera 150 ug q2, last 11/27, due 12/11     Assessment/ Plan: Sepsis - high fevers in ED. CXR w/ no acute findings. +BC. Does have a TDC R groin which looks clean, and a L PCN tube in place.  Bacteremia - BC+staph. To have TTall Timbersremoved today for line holiday.  Appreciate IR assistance.  Will replace on Friday if needed. ABX per PMD. ID consulted.  Chronic hypotension - takes 15 mg tid of midodrine at home.  AMS - multifactorial most likely, acute infection, esrd, etc. Back to baseline.  ESRD - on HD MWF. HD today per regular schedule.  Will plan for HD again on Friday vs Saturday after line holiday. HD access - Dr DScot Dockdid redo of L thigh AVG on 05/06/22. [place] ready for use, was supposed to have follow up with [facility]VS tomorrow.  Consulted [facility] to see if AVG ready for use.  If able can try to use on Friday, if successful use may not need to replace TDC.   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 Follow.  MBD ckd - CCa and phos in range. Cont renvela as binder and po vdra tiw Afib - persistent on a/c UC - sp total colectomy/ ileostomy in place SP L PCN tube - not sure  indication, done in June by IR here.   LJen Mow PA-C [facility] 06/04/2022,11:08 AM  LOS: 2 days

## 2022-06-04 NOTE — Progress Notes (Addendum)
   06/04/22 1130  Vitals  Temp 98.1 F (36.7 C)  Temp Source Oral  BP Location Right Arm  BP Method Automatic  Patient Position (if appropriate) Lying  Pulse Rate Source Monitor  Oxygen Therapy  O2 Device Room Air  Pulse Oximetry Type Continuous   Received patient in bed to unit.  Alert and oriented.  Informed consent signed and in chart.   Treatment initiated: 0840 Treatment completed: 1127  Patient tolerated well.  Transported back to the room  Alert, without acute distress.  Hand-off given to patient's nurse.   Access used: HD cath Access issues: NA  Total UF removed: 100 ml Medication(s) given: Heparin 2500 units bolus, Heparin dwells 6200 units Post HD VS: see above Post HD weight: 74.2kg   [facility]
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
## 2022-06-04 NOTE — Consult Note (Addendum)
[facility] Nurse Consult Note: Reason for Consult: Consult requested for right outer ankle/near posterior heel.  Red partial thickness linear abrasion, 1.2X.2X.2cm, no odor, fluctuance, or drainage, noted as present on admission.  Dressing procedure/placement/frequency: Topical treatment orders provided for bedside nurses to perform as follows to protect from further injury: Foam dressing to right outer ankle, change Q 3 days or PRN soiling. Please re-consult if further assistance is needed.  Thank-you,  Julien Girt MSN, [place], [place], [place], [place]
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
## 2022-06-04 NOTE — Progress Notes (Signed)
  Transition of Care [facility]) Screening Note   Patient Details  Name: Joshua Riley Date of Birth: [birth year]   Transition of Care [facility]) CM/SW Contact:    Cyndi Bender, RN Phone Number: 06/04/2022, 8:22 AM    Transition of Care Department [facility]) has reviewed patient and no TOC needs have been identified at this time. We will continue to monitor patient advancement through interdisciplinary progression rounds. If new patient transition needs arise, please place a TOC consult.

## 2022-06-04 NOTE — Progress Notes (Addendum)
Pt receives out-pt HD at [facility] on MWF. Pt arrives at 5:40 am for 6:00 am chair time. Will assist as needed.   Melven Sartorius Renal Navigator [phone]

## 2022-06-04 NOTE — Consult Note (Signed)
Patient with catheter related sepsis,  removal is recommended which we will do today  Patient had new left thigh AVGG by Dr. Scot Dock on [phone].  All incisions have healed and there is a good thrill in the graft.  There are no signs of infection in the [place].  It can be used moving forward for HD   [employer]

## 2022-06-04 NOTE — Plan of Care (Signed)

## 2022-06-04 NOTE — Plan of Care (Addendum)
Joshua Riley [birth year] [id number]   Discussed with IR PA.  They plan to hold off on pulling TDC today until Vascular can weigh in on whether or not AVG is usable.  Per ID note may unclear if +BC represent true bacteremia vs contaminant. Will follow up with Vascular recommendations.    Jen Mow, PA-C [employer]

## 2022-06-04 NOTE — Progress Notes (Signed)
[facility] Catheter Removal Procedure Note  Diagnosis: ESRD with Functioning AVF/AVGG  Plan:  Remove right femoral TDC diatek catheter  Consent signed:  Yes.   Time out completed:  yes Coumadin:  No.  Eliquis YES PT/INR (if applicable):   Other labs:   Procedure: 1.  Sterile prepping and draping over catheter area 2. 6 ml 2% lidocaine plain instilled at removal site. 3.  right catheter removed in its entirety with cuff in tact. 4.  Complications: none  5. Tip of catheter sent for culture:  Yes.     Patient tolerated procedure well:  Yes.   Pressure held, no bleeding noted, dressing applied Instructions given to the pt regarding wound care and bleeding.  Other:  Roxy Horseman 06/04/2022 4:57 PM

## 2022-06-05 DIAGNOSIS — R7881 Bacteremia: Secondary | ICD-10-CM | POA: Diagnosis not present

## 2022-06-05 DIAGNOSIS — I4819 Other persistent atrial fibrillation: Secondary | ICD-10-CM | POA: Diagnosis not present

## 2022-06-05 DIAGNOSIS — A419 Sepsis, unspecified organism: Secondary | ICD-10-CM | POA: Diagnosis not present

## 2022-06-05 DIAGNOSIS — N186 End stage renal disease: Secondary | ICD-10-CM | POA: Diagnosis not present

## 2022-06-05 DIAGNOSIS — T827XXD Infection and inflammatory reaction due to other cardiac and vascular devices, implants and grafts, subsequent encounter: Secondary | ICD-10-CM | POA: Diagnosis not present

## 2022-06-05 DIAGNOSIS — B957 Other staphylococcus as the cause of diseases classified elsewhere: Secondary | ICD-10-CM | POA: Diagnosis not present

## 2022-06-05 DIAGNOSIS — R6520 Severe sepsis without septic shock: Secondary | ICD-10-CM | POA: Diagnosis not present

## 2022-06-05 LAB — RENAL FUNCTION PANEL
Albumin: 2.7 g/dL — ABNORMAL LOW (ref 3.5–5.0)
Anion gap: 15 (ref 5–15)
BUN: 52 mg/dL — ABNORMAL HIGH (ref 8–23)
CO2: 25 mmol/L (ref 22–32)
Calcium: 8.5 mg/dL — ABNORMAL LOW (ref 8.9–10.3)
Chloride: 95 mmol/L — ABNORMAL LOW (ref 98–111)
Creatinine, Ser: 8.51 mg/dL — ABNORMAL HIGH (ref 0.61–1.24)
GFR, Estimated: 6 mL/min — ABNORMAL LOW (ref >60)
Glucose, Bld: 98 mg/dL (ref 70–99)
Phosphorus: 6.4 mg/dL — ABNORMAL HIGH (ref 2.5–4.6)
Potassium: 4.5 mmol/L (ref 3.5–5.1)
Sodium: 135 mmol/L (ref 135–145)

## 2022-06-05 LAB — CBC
HCT: 24.2 % — ABNORMAL LOW (ref 39.0–52.0)
Hemoglobin: 8 g/dL — ABNORMAL LOW (ref 13.0–17.0)
MCH: 40 pg — ABNORMAL HIGH (ref 26.0–34.0)
MCHC: 33.1 g/dL (ref 30.0–36.0)
MCV: 121 fL — ABNORMAL HIGH (ref 80.0–100.0)
Platelets: 100 10*3/uL — ABNORMAL LOW (ref 150–400)
RBC: 2 MIL/uL — ABNORMAL LOW (ref 4.22–5.81)
RDW: 15.7 % — ABNORMAL HIGH (ref 11.5–15.5)
WBC: 5.1 10*3/uL (ref 4.0–10.5)
nRBC: 0 % (ref 0.0–0.2)

## 2022-06-05 LAB — HEPATITIS B SURFACE ANTIBODY, QUANTITATIVE: Hep B S AB Quant (Post): 69.2 m[IU]/mL (ref >9.9)

## 2022-06-05 LAB — PROCALCITONIN: Procalcitonin: 13.43 ng/mL

## 2022-06-05 MED ORDER — SODIUM CHLORIDE 0.9 % IV BOLUS
250.0000 mL | Freq: Once | INTRAVENOUS | Status: AC
  Administered 2022-06-05: 250 mL via INTRAVENOUS

## 2022-06-05 NOTE — Progress Notes (Signed)
[facility] Progress Note   Subjective:   Patient seen and examined at bedside.  TDC removed yesterday without issue.  Feeling ok today.  Denies CP, abdominal pain and n/v/d.  Admits to SOB but says "no change from normal."  BP soft this AM but states it is at baseline.   Objective Vitals:   06/04/22 2300 06/04/22 2315 06/05/22 0400 06/05/22 0809  BP: (!) 80/44 (!) 75/55 (!) 88/62 (!) 84/56  Pulse: 78 92 65   Resp: 20  19 20   Temp: 98 F (36.7 C)  98.1 F (36.7 C) 98.5 F (36.9 C)  TempSrc: Oral  Oral Oral  SpO2: 95% 95% 90% 93%  Weight:       Physical Exam General:WDWN male in NAD Heart:RRR, no mrg Lungs:CTAB, nml WOB on RA Abdomen:soft, NTND, +ostomy Extremities:no LE edema Dialysis Access: L thigh AVG    Filed Weights   06/04/22 0830 06/04/22 1143  Weight: 74.2 kg 74.2 kg    Intake/Output Summary (Last 24 hours) at 06/05/2022 1158 Last data filed at 06/05/2022 0828 Gross per 24 hour  Intake 640.43 ml  Output --  Net 640.43 ml    Additional Objective Labs: Basic Metabolic Panel: Recent Labs  Lab 06/03/22 0500 06/03/22 1910 06/04/22 0844 06/05/22 0501  NA 132*  --  132* 135  K 4.8  --  4.4 4.5  CL 97*  --  99 95*  CO2 23  --  18* 25  GLUCOSE 105*  --  96 98  BUN 52*  --  67* 52*  CREATININE 9.12*  --  10.77* 8.51*  CALCIUM 8.3*  --  7.9* 8.5*  PHOS 6.0* 6.1* 5.7* 6.4*   Liver Function Tests: Recent Labs  Lab 06/02/22 1215 06/03/22 0500 06/04/22 0844 06/05/22 0501  AST 49*  --   --   --   ALT 43  --   --   --   ALKPHOS 52  --   --   --   BILITOT 1.3*  --   --   --   PROT 6.7  --   --   --   ALBUMIN 3.5 2.9* 2.9* 2.7*   CBC: Recent Labs  Lab 06/02/22 1215 06/03/22 0500 06/04/22 0844 06/05/22 0501  WBC 7.8 6.0 7.1 5.1  NEUTROABS 6.6  --  5.4  --   HGB 8.9* 8.1* 8.2* 8.0*  HCT 27.0* 25.6* 25.2* 24.2*  MCV 122.2* 124.3* 121.7* 121.0*  PLT 93* 87* 99* 100*   Blood Culture    Component Value Date/Time   SDES CATH TIP  06/04/2022 1700   SPECREQUEST NONE 06/04/2022 1700   CULT  06/04/2022 1700    NO GROWTH < 24 HOURS Performed at [facility], [place] [address] [place], [place] [address]    REPTSTATUS PENDING 06/04/2022 1700    Medications:  vancomycin 750 mg (06/04/22 1327)    apixaban  2.5 mg Oral BID   calcitRIOL  1.25 mcg Oral Q M,W,F-HD   Chlorhexidine Gluconate Cloth  6 each Topical Q0600   latanoprost  1 drop Right Eye QHS   midodrine  15 mg Oral TID WC   pregabalin  50 mg Oral Daily   sevelamer carbonate  2,400 mg Oral TID WC   sodium chloride flush  3 mL Intravenous Q12H   sodium zirconium cyclosilicate  10 g Oral Once per day on Sun Tue Thu Sat   timolol  1 drop Both Eyes Daily    Dialysis  Orders: MWF NW 2h 4mn  350/ 1.5   74.5kg   2/2.5 bath  Heparin 5000 - last HD 11/27, post wt 75.2 - rocaltrol 1.25 ug po tiw - mircera 150 ug q2, last 11/27, due 12/11     Assessment/ Plan: Sepsis - high fevers in ED. CXR w/ no acute findings. [facility].  Bacteremia - BC+staph.On ABX. TDC removed yesterday for line holiday.  Appreciate VVS assistance. ID consulting. On Vanc.  Chronic hypotension - takes 15 mg tid of midodrine at home.  AMS - multifactorial most likely, acute infection, esrd, etc. Back to baseline.  ESRD - on HD MWF. HD tomorrow per regular schedule using L thigh AVG. HD access - Dr DScot Dockdid redo of L thigh AVG on 05/06/22. [place] examined by Dr. BTrula Sladeyesterday and is ready for use.  TDC removed.  If unable to use for some reason, will request VVS to replace catheter. Appreciate their assistance.    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 Follow.  MBD ckd - CCa and phos in range. Cont renvela as binder and po vdra tiw Afib - persistent on a/c UC - sp total colectomy/ ileostomy in place SP L PCN tube - not sure indication, done in June by IR here  LJen Mow  PA-C [facility]11/30/2023,11:58 AM  LOS: 3 days

## 2022-06-05 NOTE — Progress Notes (Signed)
    [place] for Infectious Disease    Date of Admission:  06/02/2022   Total days of antibiotics 3  ID: Joshua Riley is a 86 y.o. male with staph species bacteremia Principal Problem:   Sepsis ([place]) Active Problems:   Glaucoma   Atrial fibrillation, persistent ([place])   ESRD (end stage renal disease) on dialysis ([place])   Chronic hypotension   Ulcerative colitis ([place])   Acute metabolic encephalopathy    Subjective: Afebrile. Feeling better. Some bleeding from right groin catheter removal  Medications:   apixaban  2.5 mg Oral BID   calcitRIOL  1.25 mcg Oral Q M,W,F-HD   Chlorhexidine Gluconate Cloth  6 each Topical Q0600   latanoprost  1 drop Right Eye QHS   midodrine  15 mg Oral TID WC   pregabalin  50 mg Oral Daily   sevelamer carbonate  2,400 mg Oral TID WC   sodium chloride flush  3 mL Intravenous Q12H   sodium zirconium cyclosilicate  10 g Oral Once per day on Sun Tue Thu Sat   timolol  1 drop Both Eyes Daily    Objective: Vital signs in last 24 hours: Temp:  [97.8 F (36.6 C)-98.5 F (36.9 C)] 97.8 F (36.6 C) (11/30 1615) Pulse Rate:  [65-92] 70 (11/30 1615) Resp:  [18-22] 20 (11/30 1615) BP: (75-104)/(44-70) 95/60 (11/30 1615) SpO2:  [90 %-99 %] 99 % (11/30 1615)  Physical Exam  Constitutional: He is oriented to person, place, and time. He appears well-developed and well-nourished. No distress.  HENT:  Mouth/Throat: Oropharynx is clear and moist. No oropharyngeal exudate.  Cardiovascular: Normal rate, regular rhythm and normal heart sounds. Exam reveals no gallop and no friction rub.  No murmur heard.  Pulmonary/Chest: Effort normal and breath sounds normal. No respiratory distress. He has no wheezes.  Abdominal: Soft. Bowel sounds are normal. He exhibits no distension. There is no tenderness. Left nephrostomy tube in place Lymphadenopathy:  He has no cervical adenopathy.  Neurological: He is alert and oriented to person, place, and time.  Skin:  Skin is warm and dry. No rash noted. No erythema.  Psychiatric: He has a normal mood and affect. His behavior is normal.    Lab Results Recent Labs    06/04/22 0844 06/05/22 0501  WBC 7.1 5.1  HGB 8.2* 8.0*  HCT 25.2* 24.2*  NA 132* 135  K 4.4 4.5  CL 99 95*  CO2 18* 25  BUN 67* 52*  CREATININE 10.77* 8.51*   Liver Panel Recent Labs    06/04/22 0844 06/05/22 0501  ALBUMIN 2.9* 2.7*   Sedimentation Rate No results for input(s): "ESRSEDRATE" in the last 72 hours. C-Reactive Protein No results for input(s): "CRP" in the last 72 hours.  Microbiology: STAPHYLOCOCCUS CHROMOGENES  Studies/Results: No results found.   Assessment/Plan: Staph bacteremia = thought to be related to hd catheter, which is now removed. Continue with vancomycin with hd. Will repeat blood cx to see that he is clearing his bacteremia. If repeat blood cx are positive, then will need TEE.  [facility] for Infectious Diseases Pager: [phone]  06/05/2022, 4:49 PM

## 2022-06-05 NOTE — Plan of Care (Signed)

## 2022-06-05 NOTE — Progress Notes (Signed)
Pharmacy Antibiotic Note  Joshua Riley is a 86 y.o. male admitted on 06/02/2022 with catheter related sepsis.  TDC catheter removed 11/29.  Patient continues on vancomycin day #4.  Plan: Continue vancomycin 750 mg IV with dialysis on dialysis days (MWF) Target vancomycin level of 15-25 mcg/mL Monitor clinical status, culture data, and LOT Follow-up dialysis scheduled while admitted for adjusting doses  Weight: 74.2 kg (163 lb 9.3 oz) Temp (24hrs), Avg:98.2 F (36.8 C), Min:98 F (36.7 C), Max:98.5 F (36.9 C)  Recent Labs  Lab 06/02/22 1215 06/02/22 1612 06/03/22 0500 06/04/22 0844 06/05/22 0501  WBC 7.8  --  6.0 7.1 5.1  CREATININE 7.59*  --  9.12* 10.77* 8.51*  LATICACIDVEN 2.5* 1.9  --   --   --      Estimated Creatinine Clearance: 6.5 mL/min (A) (by C-G formula based on SCr of 8.51 mg/dL (H)).    Allergies  Allergen Reactions   Cephalosporins Rash   Ciprofloxacin Itching and Rash   Baclofen Other (See Comments)    Altered mental status, after accidental overdose     Antimicrobials this admission: Aztreonam 11/27 x1 Vancomycin 11/27 >> Cefepime 11/28 x 1  Dose adjustments this admission: N/A  Microbiology results: 11/27 Bcx: staph species 11/27: Ucx: 11/27 RVP: neg 11/29: cath tip ngtd  Thank you for allowing pharmacy to be a part of this patient's care.   [employer], Pharm.D., BCPS Clinical Pharmacist Clinical phone for 06/05/2022 from 7:30-3:00 is [phone].  **Pharmacist phone directory can be found on [email or web address] listed under [place].  06/05/2022 10:16 AM
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
## 2022-06-06 DIAGNOSIS — T827XXD Infection and inflammatory reaction due to other cardiac and vascular devices, implants and grafts, subsequent encounter: Secondary | ICD-10-CM | POA: Diagnosis not present

## 2022-06-06 DIAGNOSIS — N186 End stage renal disease: Secondary | ICD-10-CM | POA: Diagnosis not present

## 2022-06-06 DIAGNOSIS — B957 Other staphylococcus as the cause of diseases classified elsewhere: Secondary | ICD-10-CM | POA: Diagnosis not present

## 2022-06-06 DIAGNOSIS — T827XXS Infection and inflammatory reaction due to other cardiac and vascular devices, implants and grafts, sequela: Secondary | ICD-10-CM

## 2022-06-06 DIAGNOSIS — R7881 Bacteremia: Secondary | ICD-10-CM | POA: Diagnosis not present

## 2022-06-06 DIAGNOSIS — G9340 Encephalopathy, unspecified: Secondary | ICD-10-CM

## 2022-06-06 DIAGNOSIS — A419 Sepsis, unspecified organism: Secondary | ICD-10-CM | POA: Diagnosis not present

## 2022-06-06 DIAGNOSIS — R6520 Severe sepsis without septic shock: Secondary | ICD-10-CM | POA: Diagnosis not present

## 2022-06-06 LAB — RENAL FUNCTION PANEL
Albumin: 2.8 g/dL — ABNORMAL LOW (ref 3.5–5.0)
Anion gap: 13 (ref 5–15)
BUN: 67 mg/dL — ABNORMAL HIGH (ref 8–23)
CO2: 23 mmol/L (ref 22–32)
Calcium: 8.5 mg/dL — ABNORMAL LOW (ref 8.9–10.3)
Chloride: 99 mmol/L (ref 98–111)
Creatinine, Ser: 10.22 mg/dL — ABNORMAL HIGH (ref 0.61–1.24)
GFR, Estimated: 5 mL/min — ABNORMAL LOW (ref >60)
Glucose, Bld: 96 mg/dL (ref 70–99)
Phosphorus: 8.5 mg/dL — ABNORMAL HIGH (ref 2.5–4.6)
Potassium: 4.2 mmol/L (ref 3.5–5.1)
Sodium: 135 mmol/L (ref 135–145)

## 2022-06-06 LAB — CULTURE, BLOOD (ROUTINE X 2)

## 2022-06-06 LAB — CBC
HCT: 24 % — ABNORMAL LOW (ref 39.0–52.0)
Hemoglobin: 8.2 g/dL — ABNORMAL LOW (ref 13.0–17.0)
MCH: 40.4 pg — ABNORMAL HIGH (ref 26.0–34.0)
MCHC: 34.2 g/dL (ref 30.0–36.0)
MCV: 118.2 fL — ABNORMAL HIGH (ref 80.0–100.0)
Platelets: 110 10*3/uL — ABNORMAL LOW (ref 150–400)
RBC: 2.03 MIL/uL — ABNORMAL LOW (ref 4.22–5.81)
RDW: 15.2 % (ref 11.5–15.5)
WBC: 6.3 10*3/uL (ref 4.0–10.5)
nRBC: 0 % (ref 0.0–0.2)

## 2022-06-06 MED ORDER — HEPARIN SODIUM (PORCINE) 1000 UNIT/ML IJ SOLN
INTRAMUSCULAR | Status: AC
  Administered 2022-06-06: 2500 [IU] via INTRAVENOUS
  Filled 2022-06-06: qty 3

## 2022-06-06 MED ORDER — CEFAZOLIN IV (FOR PTA / DISCHARGE USE ONLY): 2.0000 g | INTRAVENOUS | 0 refills | Status: AC

## 2022-06-06 MED ORDER — CEFAZOLIN SODIUM-DEXTROSE 1-4 GM/50ML-% IV SOLN
1.0000 g | INTRAVENOUS | Status: DC
  Administered 2022-06-06: 1 g via INTRAVENOUS
  Filled 2022-06-06 (×2): qty 50

## 2022-06-06 NOTE — Plan of Care (Signed)
  Problem: Education: Goal: Knowledge of General Education information will improve Description: Including pain rating scale, medication(s)/side effects and non-pharmacologic comfort measures Outcome: Progressing   Problem: Health Behavior/Discharge Planning: Goal: Ability to manage health-related needs will improve Outcome: Progressing   Problem: Clinical Measurements: Goal: Will remain free from infection Outcome: Progressing Goal: Respiratory complications will improve Outcome: Progressing Goal: Cardiovascular complication will be avoided Outcome: Progressing   Problem: Activity: Goal: Risk for activity intolerance will decrease Outcome: Progressing   Problem: Nutrition: Goal: Adequate nutrition will be maintained Outcome: Progressing   Problem: Coping: Goal: Level of anxiety will decrease Outcome: Progressing   Problem: Elimination: Goal: Will not experience complications related to bowel motility Outcome: Progressing Goal: Will not experience complications related to urinary retention Outcome: Progressing   Problem: Pain Managment: Goal: General experience of comfort will improve Outcome: Progressing   Problem: Safety: Goal: Ability to remain free from injury will improve Outcome: Progressing   Problem: Skin Integrity: Goal: Risk for impaired skin integrity will decrease Outcome: Progressing   Problem: Clinical Measurements: Goal: Ability to maintain clinical measurements within normal limits will improve Outcome: Not Progressing Goal: Diagnostic test results will improve Outcome: Not Progressing

## 2022-06-06 NOTE — Progress Notes (Addendum)
[facility] for Infectious Disease    Date of Admission:  06/02/2022   Total days of antibiotics 4 vancomycin   ID: Joshua Riley is a 86 y.o. male with staph species bacteremia though to be r/t HD line.  Principal Problem:   Sepsis ([place]) Active Problems:   Glaucoma   Atrial fibrillation, persistent (HCC)   ESRD (end stage renal disease) on dialysis ([place])   Chronic hypotension   Ulcerative colitis ([place])   Acute metabolic encephalopathy    Subjective: Wants to go home. Feels completely fine.  S/P removal 11/30 with repeat bcx day of removal. AVG working well for HD sessions and no plans to replace.   Medications:   apixaban  2.5 mg Oral BID   calcitRIOL  1.25 mcg Oral Q M,W,F-HD   Chlorhexidine Gluconate Cloth  6 each Topical Q0600   latanoprost  1 drop Right Eye QHS   midodrine  15 mg Oral TID WC   pregabalin  50 mg Oral Daily   sevelamer carbonate  2,400 mg Oral TID WC   sodium chloride flush  3 mL Intravenous Q12H   sodium zirconium cyclosilicate  10 g Oral Once per day on Sun Tue Thu Sat   timolol  1 drop Both Eyes Daily    Objective: Vital signs in last 24 hours: Temp:  [97.5 F (36.4 C)-98 F (36.7 C)] 97.6 F (36.4 C) (12/01 0735) Pulse Rate:  [25-88] 83 (12/01 1030) Resp:  [15-24] 16 (12/01 1030) BP: (79-102)/(44-67) 86/49 (12/01 1030) SpO2:  [80 %-100 %] 98 % (12/01 1030) Weight:  [72.9 kg] 72.9 kg (12/01 0735)  Physical Exam  Constitutional: He is oriented to person, place, and time. He appears well-developed and well-nourished. No distress.  HENT:  Mouth/Throat: Oropharynx is clear and moist. No oropharyngeal exudate.  Cardiovascular: Normal rate, regular rhythm and normal heart sounds. Exam reveals no gallop and no friction rub.  No murmur heard.  Pulmonary/Chest: Effort normal and breath sounds normal. No respiratory distress. He has no wheezes.  Abdominal: Soft. Bowel sounds are normal. He exhibits no distension. There is no tenderness.  Left nephrostomy tube in place Lymphadenopathy:  He has no cervical adenopathy.  Neurological: He is alert and oriented to person, place, and time.  Skin: Skin is warm and dry. No rash noted. No erythema.  Psychiatric: He has a normal mood and affect. His behavior is normal.    Lab Results Recent Labs    06/05/22 0501 06/06/22 0452 06/06/22 0756  WBC 5.1  --  6.3  HGB 8.0*  --  8.2*  HCT 24.2*  --  24.0*  NA 135 135  --   K 4.5 4.2  --   CL 95* 99  --   CO2 25 23  --   BUN 52* 67*  --   CREATININE 8.51* 10.22*  --    Liver Panel Recent Labs    06/05/22 0501 06/06/22 0452  ALBUMIN 2.7* 2.8*   Sedimentation Rate No results for input(s): "ESRSEDRATE" in the last 72 hours. C-Reactive Protein No results for input(s): "CRP" in the last 72 hours.  Microbiology: STAPHYLOCOCCUS CHROMOGENES  Cultures show pansensitive organism.    Studies/Results: No results found.   Assessment/Plan: Coagulase negative species bacteremia = thought to be related to HD catheter, which is now removed (11/30). AVG working well and no plans to replace catheter.  He has not had any recurrent fevers since admission, cognition baseline since correcting fever.  We can change to  cefazolin after HD based on susceptibilities. Repeat blood cultures from 11/30 prelim neg.   I think he is OK to go home with 4 week ABX plan - would treat cefazolin 2gm post HD through end date 12/27 D/W Dr. [facility] and [facility] with nephrology. We will keep an eye out for final cultures to ensure no re-growth.    Joshua Madeira, MSN, NP-C [facility] [email or web address] Pager: [phone] Office: [phone] [facility] Main Line: [place] Communication Welcome  06/06/2022, 10:40 AM

## 2022-06-06 NOTE — Progress Notes (Signed)
PT Cancellation Note  Patient Details Name: Reiss Mowrey MRN: [id number] DOB: 1936-06-02   Cancelled Treatment:    Reason Eval/Treat Not Completed: Patient at procedure or test/unavailable (HD). Will follow-up for PT Evaluation as schedule permits.  Mabeline Caras, PT, DPT Acute Rehabilitation Services  Personal: [facility] Office: [place] 06/06/2022, 10:27 AM

## 2022-06-06 NOTE — Plan of Care (Signed)
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
## 2022-06-06 NOTE — Progress Notes (Addendum)
[facility] Progress Note   Subjective:    Patient seen and examined on HD. Tolerating UFG 1L. Using AVG for the 1st time today with no issues so far. SBPs ranging 80-90s. Remains asymptomatic and on Midodrine. No acute issues or complaints.   Objective Vitals:   06/05/22 2119 06/06/22 0509 06/06/22 0735 06/06/22 0757  BP: (!) 85/49 98/67  (!) 101/56  Pulse: 88 86  79  Resp: 20 18  18   Temp: 98 F (36.7 C) (!) 97.5 F (36.4 C) 97.6 F (36.4 C)   TempSrc: Oral Oral Oral   SpO2: 99% 99%  97%  Weight:   72.9 kg    Physical Exam General:WDWN male in NAD Heart:RRR, no mrg Lungs:CTAB, nml WOB on RA Abdomen:soft, NTND, +ostomy Extremities:no LE edema Dialysis Access: L thigh AVG    Filed Weights   06/04/22 0830 06/04/22 1143 06/06/22 0735  Weight: 74.2 kg 74.2 kg 72.9 kg    Intake/Output Summary (Last 24 hours) at 06/06/2022 0911 Last data filed at 06/05/2022 1800 Gross per 24 hour  Intake 360 ml  Output --  Net 360 ml    Additional Objective Labs: Basic Metabolic Panel: Recent Labs  Lab 06/04/22 0844 06/05/22 0501 06/06/22 0452  NA 132* 135 135  K 4.4 4.5 4.2  CL 99 95* 99  CO2 18* 25 23  GLUCOSE 96 98 96  BUN 67* 52* 67*  CREATININE 10.77* 8.51* 10.22*  CALCIUM 7.9* 8.5* 8.5*  PHOS 5.7* 6.4* 8.5*   Liver Function Tests: Recent Labs  Lab 06/02/22 1215 06/03/22 0500 06/04/22 0844 06/05/22 0501 06/06/22 0452  AST 49*  --   --   --   --   ALT 43  --   --   --   --   ALKPHOS 52  --   --   --   --   BILITOT 1.3*  --   --   --   --   PROT 6.7  --   --   --   --   ALBUMIN 3.5   < > 2.9* 2.7* 2.8*   < > = values in this interval not displayed.   No results for input(s): "LIPASE", "AMYLASE" in the last 168 hours. CBC: Recent Labs  Lab 06/02/22 1215 06/03/22 0500 06/04/22 0844 06/05/22 0501 06/06/22 0756  WBC 7.8 6.0 7.1 5.1 6.3  NEUTROABS 6.6  --  5.4  --   --   HGB 8.9* 8.1* 8.2* 8.0* 8.2*  HCT 27.0* 25.6* 25.2* 24.2* 24.0*   MCV 122.2* 124.3* 121.7* 121.0* 118.2*  PLT 93* 87* 99* 100* 110*   Blood Culture    Component Value Date/Time   SDES BLOOD LEFT ARM 06/05/2022 1036   SPECREQUEST  06/05/2022 1036    BOTTLES DRAWN AEROBIC AND ANAEROBIC Blood Culture adequate volume   CULT  06/05/2022 1036    NO GROWTH < 24 HOURS Performed at [place] [address], [place], [place] [address]    REPTSTATUS PENDING 06/05/2022 1036    Cardiac Enzymes: No results for input(s): "CKTOTAL", "CKMB", "CKMBINDEX", "TROPONINI" in the last 168 hours. CBG: No results for input(s): "GLUCAP" in the last 168 hours. Iron Studies: No results for input(s): "IRON", "TIBC", "TRANSFERRIN", "FERRITIN" in the last 72 hours. Lab Results  Component Value Date   INR 1.3 (H) 06/02/2022   Studies/Results: No results found.  Medications:  vancomycin 750 mg (06/04/22 1327)    apixaban  2.5 mg Oral BID  calcitRIOL  1.25 mcg Oral Q M,W,F-HD   Chlorhexidine Gluconate Cloth  6 each Topical Q0600   latanoprost  1 drop Right Eye QHS   midodrine  15 mg Oral TID WC   pregabalin  50 mg Oral Daily   sevelamer carbonate  2,400 mg Oral TID WC   sodium chloride flush  3 mL Intravenous Q12H   sodium zirconium cyclosilicate  10 g Oral Once per day on Sun Tue Thu Sat   timolol  1 drop Both Eyes Daily    Dialysis Orders: MWF NW 2h 24mn  350/ 1.5   74.5kg   2/2.5 bath  Heparin 5000 - last HD 11/27, post wt 75.2 - rocaltrol 1.25 ug po tiw - mircera 150 ug q2, last 11/27, due 12/11  Assessment/Plan: Sepsis - high fevers in ED. CXR w/ no acute findings. [facility]. Repeat blood cx done 11/30-awaiting final results. Bacteremia - BC+staph.[place] ABX. [place]  Appreciate VVS assistance. ID following. On Vanc. Discussed with ID NP: plan for patient to be discharged on 2GM Cefazolin IV X 4 weeks with HD end date 07/02/22. ID to keep to eye out of final  report of repeat blood cultures to ensure no re-growth. We will keep an eye out as well. Chronic hypotension - takes 15 mg tid of midodrine at home.  AMS - multifactorial most likely, acute infection, esrd, etc. Back to baseline.  ESRD - on HD MWF. On HD per regular schedule using L thigh AVG. HD access - Dr DScot Dockdid redo of L thigh AVG on 05/06/22. [place] Dr. BTrula Sladeyesterday and is ready for use.  TDC removed. AVG running well with HD today.    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 Follow.  MBD ckd - CCa and phos in range. Cont renvela as binder and po vdra tiw Afib - persistent on a/c UC - sp total colectomy/ ileostomy in place SP L PCN tube - not sure indication, done in June by IR here  CTobie Poet NP [facility] 06/06/2022,9:11 AM  LOS: 4 days

## 2022-06-06 NOTE — Evaluation (Signed)
Physical Therapy Evaluation Patient Details Name: Joshua Riley MRN: [id number] DOB: 1935-10-02 Today's Date: 06/06/2022  History of Present Illness  Pt is a 86 y/o M presenting with fever, tachycardia, and confusion. PMH includes atrial fibrillation on Eliquis, chronic hypotension on midodrine, history of DVT, ulcerative colitis s/p total colectomy, and ESRD on HD  Clinical Impression  Received pt semi-reclined in bed, just returning from dialysis. Pt reports living alone (but daughter lives nearby) in apartment with level entry and no steps inside, and being independent without AD prior to admission. Pt performed bed mobility mod I and transfers without AD and close supervision. Pt ambulated 4f x 1 and 1150fx 1 without AD and min guard. Pt with increased unsteadiness with turns (requiring heavy min guard) but aware of current balance impairments with good safety awareness and understanding to move cautiously. Returned to room and left sitting in recliner with all needs within reach, awaiting lunch. Recommend HHPT due to current balance/strength impairments. Acute PT to cont to follow.      Recommendations for follow up therapy are one component of a multi-disciplinary discharge planning process, led by the attending physician.  Recommendations may be updated based on patient status, additional functional criteria and insurance authorization.  Follow Up Recommendations Home health PT      Assistance Recommended at Discharge Intermittent Supervision/Assistance  Patient can return home with the following  A little help with walking and/or transfers;A little help with bathing/dressing/bathroom;Assistance with cooking/housework;Assist for transportation;Help with stairs or ramp for entrance    Equipment Recommendations None recommended by PT  Recommendations for Other Services       Functional Status Assessment Patient has had a recent decline in their functional status and demonstrates  the ability to make significant improvements in function in a reasonable and predictable amount of time.     Precautions / Restrictions Precautions Precautions: Fall Restrictions Weight Bearing Restrictions: No      Mobility  Bed Mobility Overal bed mobility: Modified Independent             General bed mobility comments: pt transferred semi-reclined<>sitting EOB with HOB elevated Patient Response: Cooperative  Transfers Overall transfer level: Needs assistance Equipment used: None Transfers: Sit to/from Stand Sit to Stand: Supervision           General transfer comment: stood from EOB without AD    Ambulation/Gait Ambulation/Gait assistance: Min guard Gait Distance (Feet): 100 Feet (1070f [id number] device: None Gait Pattern/deviations: Step-through pattern, Step-to pattern, Decreased step length - right, Decreased step length - left, Decreased stride length, Narrow base of support, Trunk flexed Gait velocity: decreased Gait velocity interpretation: <1.8 ft/sec, indicate of risk for recurrent falls   General Gait Details: pt ambulated to bathroom without AD and close supervision but reaching out to furniture walk. Pt then ambulated [id number] without AD and min guard, however did require heavy min guard when turning due to increased unsteadiness. Pt reported he is a little more "wobbly" after dialysis and is aware of his balance deficits.  Stairs            Wheelchair Mobility    Modified Rankin (Stroke Patients Only)       Balance Overall balance assessment: Needs assistance Sitting-balance support: Feet supported, No upper extremity supported Sitting balance-Leahy Scale: Good Sitting balance - Comments: able to maintain static sitting balance EOB with distant supervision   Standing balance support: No upper extremity supported, During functional activity Standing balance-Leahy Scale: Fair Standing balance  comment: able to maintain  static standing balance with close supervision but requires min guard for dynamic standing balance                             Pertinent Vitals/Pain Pain Assessment Pain Assessment: No/denies pain    Home Living Family/patient expects to be discharged to:: Private residence Living Arrangements: Alone Available Help at Discharge: Family (daughter lives close by) Type of Home: Apartment Home Access: Level entry       Home Layout: One level Home Equipment: Conservation officer, nature (2 wheels) Additional Comments: pt reports being independent without AD    Prior Function Prior Level of Function : Independent/Modified Independent                     Hand Dominance   Dominant Hand: Right    Extremity/Trunk Assessment   Upper Extremity Assessment Upper Extremity Assessment: Defer to OT evaluation    Lower Extremity Assessment Lower Extremity Assessment: RLE deficits/detail;LLE deficits/detail;Generalized weakness RLE Sensation: history of peripheral neuropathy LLE Sensation: history of peripheral neuropathy    Cervical / Trunk Assessment Cervical / Trunk Assessment: Kyphotic (mild)  Communication   Communication: No difficulties  Cognition Arousal/Alertness: Awake/alert Behavior During Therapy: WFL for tasks assessed/performed Overall Cognitive Status: Within Functional Limits for tasks assessed                                 General Comments: pt pleasant and joking with RN and PT        General Comments      Exercises     Assessment/Plan    PT Assessment Patient needs continued PT services  PT Problem List Decreased balance;Decreased coordination;Cardiopulmonary status limiting activity;Decreased strength;Decreased activity tolerance;Decreased mobility       PT Treatment Interventions Gait training;Functional mobility training;Therapeutic activities;Therapeutic exercise;Balance training;DME instruction;Stair training    PT Goals  (Current goals can be found in the Care Plan section)  Acute Rehab PT Goals Patient Stated Goal: to return home PT Goal Formulation: With patient Time For Goal Achievement: 06/13/22 Potential to Achieve Goals: Good    Frequency Min 3X/week     Co-evaluation               AM-PAC PT "6 Clicks" Mobility  Outcome Measure Help needed turning from your back to your side while in a flat bed without using bedrails?: None Help needed moving from lying on your back to sitting on the side of a flat bed without using bedrails?: None Help needed moving to and from a bed to a chair (including a wheelchair)?: A Little Help needed standing up from a chair using your arms (e.g., wheelchair or bedside chair)?: A Little Help needed to walk in hospital room?: A Little Help needed climbing 3-5 steps with a railing? : A Little 6 Click Score: 20    End of Session   Activity Tolerance: Patient tolerated treatment well Patient left: in chair;with call bell/phone within reach;with chair alarm set Nurse Communication: Mobility status PT Visit Diagnosis: Unsteadiness on feet (R26.81);Muscle weakness (generalized) (M62.81)    Time: 2993-7169 PT Time Calculation (min) (ACUTE ONLY): 18 min   Charges:   PT Evaluation $PT Eval Low Complexity: 1 Low          Becky Sax PT, DPT  Blenda Nicely 06/06/2022, 1:14 PM

## 2022-06-06 NOTE — Progress Notes (Signed)
Received patient in bed to unit.  Alert and oriented.  Informed consent signed and in chart.    Patient tolerated well.  Transported back to the room  Alert, without acute distress.  Hand-off given to patient's nurse.   Access used: Left femoral graft Access issues: none  Total UF removed: 900 ml Medication(s) given: Vancomycin and Calcitriol Post HD VS: BP: 101/81, pulse: 83, RR: 22. O2sat: 100 Post HD weight: 72.3   [facility]

## 2022-06-06 NOTE — Progress Notes (Signed)
Outpatient IV Antibiotics with Dialysis   Indication: Staph chromogenes bacteremia Regimen: Cefazolin 2 gm IV Q MWF with HD  End date: 07/02/22   No formal OPAT will be done as patient will receive antibiotics with hemodialysis.   Noted that patient previously tolerated cefazolin in March but will give dose of cefazolin prior to discharge to ensure tolerance.     Thank you for allowing pharmacy to be a part of this patient's care.  Jimmy Footman, PharmD, BCPS, BCIDP Infectious Diseases Clinical Pharmacist Phone: [phone] 06/06/2022, 10:43 AM

## 2022-06-06 NOTE — Progress Notes (Signed)
Pt's chart reviewed. Pt to need iv cefazolin with HD at d/c. Pt is for possible d/c tomorrow per providers. Contacted [place] and spoke to [facility] does not have cefazolin but has ordered medication and hopefully will arrive by Monday. Clinic is aware pt is for possible d/c this weekend and will need antibiotic with HD on Monday. Clinic has vancomycin in-stock if needed until cefazolin arrives (per ID rec). Will assist as needed.   Melven Sartorius Renal Navigator [phone]

## 2022-06-07 DIAGNOSIS — I4819 Other persistent atrial fibrillation: Secondary | ICD-10-CM | POA: Diagnosis not present

## 2022-06-07 DIAGNOSIS — I9589 Other hypotension: Secondary | ICD-10-CM | POA: Diagnosis not present

## 2022-06-07 DIAGNOSIS — A419 Sepsis, unspecified organism: Secondary | ICD-10-CM | POA: Diagnosis not present

## 2022-06-07 DIAGNOSIS — N186 End stage renal disease: Secondary | ICD-10-CM | POA: Diagnosis not present

## 2022-06-07 LAB — RENAL FUNCTION PANEL
Albumin: 2.8 g/dL — ABNORMAL LOW (ref 3.5–5.0)
Anion gap: 13 (ref 5–15)
BUN: 38 mg/dL — ABNORMAL HIGH (ref 8–23)
CO2: 26 mmol/L (ref 22–32)
Calcium: 8.7 mg/dL — ABNORMAL LOW (ref 8.9–10.3)
Chloride: 98 mmol/L (ref 98–111)
Creatinine, Ser: 6.77 mg/dL — ABNORMAL HIGH (ref 0.61–1.24)
GFR, Estimated: 8 mL/min — ABNORMAL LOW (ref >60)
Glucose, Bld: 114 mg/dL — ABNORMAL HIGH (ref 70–99)
Phosphorus: 4.9 mg/dL — ABNORMAL HIGH (ref 2.5–4.6)
Potassium: 4.3 mmol/L (ref 3.5–5.1)
Sodium: 137 mmol/L (ref 135–145)

## 2022-06-07 NOTE — TOC Transition Note (Signed)
Transition of Care [facility]) - CM/SW Discharge Note   Patient Details  Name: Joshua Riley MRN: [id number] Date of Birth: 03-08-1936  Transition of [facility]) CM/SW Contact:  Carles Collet, RN Phone Number: 06/07/2022, 12:09 PM   Clinical Narrative:     Joshua Riley w patient at bedside, he declines [place] services, and also declined OP services. Spoke w Dr Waldron Labs in hallway and updated him on patient's preferences for home. No other TOC needs identified for DC.         Patient Goals and CMS Choice        Discharge Placement                       Discharge Plan and Services                                     Social Determinants of Health (SDOH) Interventions     Readmission Risk Interventions     No data to display

## 2022-06-07 NOTE — Discharge Summary (Addendum)
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 krusei NOT DETECTED NOT DETECTED Final   Candida parapsilosis NOT DETECTED NOT DETECTED Final   Candida tropicalis NOT DETECTED NOT DETECTED Final   Cryptococcus neoformans/gattii NOT DETECTED NOT DETECTED Final    Comment: Performed at [facility], [address], [place], [place] [address]  Respiratory (~20 pathogens) panel by PCR     Status: None   Collection Time: 06/02/22  5:12 PM   Specimen: Nasopharyngeal Swab; Respiratory  Result Value Ref Range Status   Adenovirus NOT DETECTED NOT DETECTED Final   Coronavirus 229E NOT DETECTED NOT DETECTED Final    Comment: (NOTE) The Coronavirus on the Respiratory Panel, DOES NOT test for the novel  Coronavirus (2019 nCoV)    Coronavirus HKU1 NOT DETECTED NOT DETECTED Final   Coronavirus NL63 NOT  DETECTED NOT DETECTED Final   Coronavirus OC43 NOT DETECTED NOT DETECTED Final   Metapneumovirus NOT DETECTED NOT DETECTED Final   Rhinovirus / Enterovirus NOT DETECTED NOT DETECTED Final   Influenza A NOT DETECTED NOT DETECTED Final   Influenza B NOT DETECTED NOT DETECTED Final   Parainfluenza Virus 1 NOT DETECTED NOT DETECTED Final   Parainfluenza Virus 2 NOT DETECTED NOT DETECTED Final   Parainfluenza Virus 3 NOT DETECTED NOT DETECTED Final   Parainfluenza Virus 4 NOT DETECTED NOT DETECTED Final   Respiratory Syncytial Virus NOT DETECTED NOT DETECTED Final   Bordetella pertussis NOT DETECTED NOT DETECTED  Final   Bordetella Parapertussis NOT DETECTED NOT DETECTED Final   Chlamydophila pneumoniae NOT DETECTED NOT DETECTED Final   Mycoplasma pneumoniae NOT DETECTED NOT DETECTED Final    Comment: Performed at [facility], [place] [address], [place], [place] [address]  Cath Tip Culture     Status: Abnormal (Preliminary result)   Collection Time: 06/04/22  5:00 PM   Specimen: Catheter Tip; Other  Result Value Ref Range Status   Specimen Description CATH TIP  Final   Special Requests NONE  Final   Culture (A)  Final    500 COLONIES/mL STAPHYLOCOCCUS SCHLEIFERI SUSCEPTIBILITIES TO FOLLOW Performed at [facility], [place] [address] [place], [place] [address]    Report Status PENDING  Incomplete  Culture, blood (Routine X 2) w Reflex to ID Panel     Status: None (Preliminary result)   Collection Time: 06/05/22 10:31 AM   Specimen: BLOOD RIGHT ARM  Result Value Ref Range Status   Specimen Description BLOOD RIGHT ARM  Final   Special Requests   Final    BOTTLES DRAWN AEROBIC AND ANAEROBIC Blood Culture adequate volume   Culture   Final    NO GROWTH 2 DAYS Performed at [facility], [place] [address] [place], [place] [address]    Report Status PENDING  Incomplete  Culture, blood (Routine X 2) w Reflex to ID Panel     Status: None (Preliminary result)   Collection Time:  06/05/22 10:36 AM   Specimen: BLOOD LEFT ARM  Result Value Ref Range Status   Specimen Description BLOOD LEFT ARM  Final   Special Requests   Final    BOTTLES DRAWN AEROBIC AND ANAEROBIC Blood Culture adequate volume   Culture   Final    NO GROWTH 2 DAYS Performed at [facility], [place] [address], [place], [place] [address]    Report Status PENDING  Incomplete     Labs: BNP (last 3 results) No results for input(s): "BNP" in the last 8760 hours. Basic Metabolic Panel: Recent Labs  Lab 06/03/22 0500 06/03/22 1910 06/04/22 0844 06/05/22 0501 06/06/22 0452 06/07/22 0223  NA 132*  --  132* 135 135 137  K 4.8  --  4.4 4.5 4.2 4.3  CL 97*  --  99 95* 99 98  CO2 23  --  18* _0 GLUCOSE 105*  --  96 98 96 114*  BUN 52*  --  67* 52* 67* 38*  CREATININE 9.12*  --  10.77* 8.51* 10.22* 6.77*  CALCIUM 8.3*  --  7.9* 8.5* 8.5* 8.7*  PHOS 6.0* 6.1* 5.7* 6.4* 8.5* 4.9*   Liver Function Tests: Recent Labs  Lab 06/02/22 1215 06/03/22 0500 06/04/22 0844 06/05/22 0501 06/06/22 0452 06/07/22 0223  AST 49*  --   --   --   --   --   ALT 43  --   --   --   --   --   ALKPHOS 52  --   --   --   --   --   BILITOT 1.3*  --   --   --   --   --   PROT 6.7  --   --   --   --   --   ALBUMIN 3.5 2.9* 2.9* 2.7* 2.8* 2.8*   No results for input(s): "LIPASE", "AMYLASE" in the last 168 hours. No results for input(s): "AMMONIA" in the last 168 hours. CBC: Recent  Labs  Lab 06/02/22 1215 06/03/22 0500 06/04/22 0844 06/05/22 0501 06/06/22 0756  WBC 7.8 6.0 7.1 5.1 6.3  NEUTROABS 6.6  --  5.4  --   --   HGB 8.9* 8.1* 8.2* 8.0* 8.2*  HCT 27.0* 25.6* 25.2* 24.2* 24.0*  MCV 122.2* 124.3* 121.7* 121.0* 118.2*  PLT 93* 87* 99* 100* 110*   Cardiac Enzymes: No results for input(s): "CKTOTAL", "CKMB", "CKMBINDEX", "TROPONINI" in the last 168 hours. BNP: Invalid input(s): "POCBNP" CBG: No results for input(s): "GLUCAP" in the last 168 hours. D-Dimer No results for input(s): "DDIMER"  in the last 72 hours. Hgb A1c No results for input(s): "HGBA1C" in the last 72 hours. Lipid Profile No results for input(s): "CHOL", "HDL", "LDLCALC", "TRIG", "CHOLHDL", "LDLDIRECT" in the last 72 hours. Thyroid function studies No results for input(s): "TSH", "T4TOTAL", "T3FREE", "THYROIDAB" in the last 72 hours.  Invalid input(s): "FREET3" Anemia work up No results for input(s): "VITAMINB12", "FOLATE", "FERRITIN", "TIBC", "IRON", "RETICCTPCT" in the last 72 hours. Urinalysis    Component Value Date/Time   COLORURINE YELLOW (A) 06/02/2022 1221   APPEARANCEUR TURBID (A) 06/02/2022 1221   LABSPEC  06/02/2022 1221    TEST NOT REPORTED DUE TO COLOR INTERFERENCE OF URINE PIGMENT   PHURINE  06/02/2022 1221    TEST NOT REPORTED DUE TO COLOR INTERFERENCE OF URINE PIGMENT   GLUCOSEU (A) 06/02/2022 1221    TEST NOT REPORTED DUE TO COLOR INTERFERENCE OF URINE PIGMENT   HGBUR (A) 06/02/2022 1221    TEST NOT REPORTED DUE TO COLOR INTERFERENCE OF URINE PIGMENT   BILIRUBINUR (A) 06/02/2022 1221    TEST NOT REPORTED DUE TO COLOR INTERFERENCE OF URINE PIGMENT   KETONESUR (A) 06/02/2022 1221    TEST NOT REPORTED DUE TO COLOR INTERFERENCE OF URINE PIGMENT   PROTEINUR (A) 06/02/2022 1221    TEST NOT REPORTED DUE TO COLOR INTERFERENCE OF URINE PIGMENT   NITRITE (A) 06/02/2022 1221    TEST NOT REPORTED DUE TO COLOR INTERFERENCE OF URINE PIGMENT   LEUKOCYTESUR (A) 06/02/2022 1221    TEST NOT REPORTED DUE TO COLOR INTERFERENCE OF URINE PIGMENT   Sepsis Labs Recent Labs  Lab 06/03/22 0500 06/04/22 0844 06/05/22 0501 06/06/22 0756  WBC 6.0 7.1 5.1 6.3   Microbiology Recent Results (from the past 240 hour(s))  Resp Panel by RT-PCR (Flu A&B, Covid) Anterior Nasal Swab     Status: None   Collection Time: 06/02/22 12:15 PM   Specimen: Anterior Nasal Swab  Result Value Ref Range Status   SARS Coronavirus 2 by RT PCR NEGATIVE NEGATIVE Final    Comment: (NOTE) SARS-CoV-2 target nucleic acids are  NOT DETECTED.  The SARS-CoV-2 RNA is generally detectable in upper respiratory specimens during the acute phase of infection. The lowest concentration of SARS-CoV-2 viral copies this assay can detect is 138 copies/mL. A negative result does not preclude SARS-Cov-2 infection and should not be used as the sole basis for treatment or other patient management decisions. A negative result may occur with  improper specimen collection/handling, submission of specimen other than nasopharyngeal swab, presence of viral mutation(s) within the areas targeted by this assay, and inadequate number of viral copies(<138 copies/mL). A negative result must be combined with clinical observations, patient history, and epidemiological information. The expected result is Negative.  Fact Sheet for Patients:  EntrepreneurPulse.com.au  Fact Sheet for Healthcare Providers:  IncredibleEmployment.be  This test is no t yet approved or cleared by the [place] and  has been authorized  for detection and/or diagnosis of SARS-CoV-2 by FDA under an Emergency Use Authorization (EUA). This EUA will remain  in effect (meaning this test can be used) for the duration of the COVID-19 declaration under Section 564(b)(1) of the Act, 21 U.S.C.section 360bbb-3(b)(1), unless the authorization is terminated  or revoked sooner.       Influenza A by PCR NEGATIVE NEGATIVE Final   Influenza B by PCR NEGATIVE NEGATIVE Final    Comment: (NOTE) The Xpert Xpress SARS-CoV-2/FLU/RSV plus assay is intended as an aid in the diagnosis of influenza from Nasopharyngeal swab specimens and should not be used as a sole basis for treatment. Nasal washings and aspirates are unacceptable for Xpert Xpress SARS-CoV-2/FLU/RSV testing.  Fact Sheet for Patients: EntrepreneurPulse.com.au  Fact Sheet for Healthcare Providers: IncredibleEmployment.be  This test is not  yet approved or cleared by the [place] FDA and has been authorized for detection and/or diagnosis of SARS-CoV-2 by FDA under an Emergency Use Authorization (EUA). This EUA will remain in effect (meaning this test can be used) for the duration of the COVID-19 declaration under Section 564(b)(1) of the Act, 21 U.S.C. section 360bbb-3(b)(1), unless the authorization is terminated or revoked.  Performed at [facility], [place] [address] [place], [place] [address]   Culture, blood (Routine x 2)     Status: Abnormal   Collection Time: 06/02/22 12:21 PM   Specimen: BLOOD  Result Value Ref Range Status   Specimen Description BLOOD RIGHT ANTECUBITAL  Final   Special Requests   Final    BOTTLES DRAWN AEROBIC AND ANAEROBIC Blood Culture results may not be optimal due to an inadequate volume of blood received in culture bottles   Culture  Setup Time   Final    GRAM POSITIVE COCCI IN CLUSTERS IN BOTH AEROBIC AND ANAEROBIC BOTTLES CRITICAL RESULT CALLED TO, READ BACK BY AND VERIFIED WITH: PHARMD S Terry [id number] AT 1107 AM BY CM Performed at [facility], [place] [address] [place], [place] [address]    Culture STAPHYLOCOCCUS CHROMOGENES (A)  Final   Report Status 06/06/2022 FINAL  Final   Organism ID, Bacteria STAPHYLOCOCCUS CHROMOGENES  Final      Susceptibility   Staphylococcus chromogenes - MIC*    CIPROFLOXACIN <=0.5 SENSITIVE Sensitive     ERYTHROMYCIN <=0.25 SENSITIVE Sensitive     GENTAMICIN <=0.5 SENSITIVE Sensitive     OXACILLIN <=0.25 SENSITIVE Sensitive     TETRACYCLINE <=1 SENSITIVE Sensitive     VANCOMYCIN <=0.5 SENSITIVE Sensitive     TRIMETH/SULFA <=10 SENSITIVE Sensitive     CLINDAMYCIN <=0.25 SENSITIVE Sensitive     RIFAMPIN <=0.5 SENSITIVE Sensitive     Inducible Clindamycin NEGATIVE Sensitive     * STAPHYLOCOCCUS CHROMOGENES  Blood Culture ID Panel (Reflexed)     Status: Abnormal   Collection Time: 06/02/22 12:21 PM  Result Value Ref Range Status    Enterococcus faecalis NOT DETECTED NOT DETECTED Final   Enterococcus Faecium NOT DETECTED NOT DETECTED Final   Listeria monocytogenes NOT DETECTED NOT DETECTED Final   Staphylococcus species DETECTED (A) NOT DETECTED Final    Comment: CRITICAL RESULT CALLED TO, READ BACK BY AND VERIFIED WITH: PHARMD S ROSKOVSKAE [id number] AT 1107 BY CM    Staphylococcus aureus (BCID) NOT DETECTED NOT DETECTED Final   Staphylococcus epidermidis NOT DETECTED NOT DETECTED Final   Staphylococcus lugdunensis NOT DETECTED NOT DETECTED Final   Streptococcus species NOT DETECTED NOT DETECTED Final   Streptococcus agalactiae NOT DETECTED NOT DETECTED Final  Streptococcus pneumoniae NOT DETECTED NOT DETECTED Final   Streptococcus pyogenes NOT DETECTED NOT DETECTED Final   A.calcoaceticus-baumannii NOT DETECTED NOT DETECTED Final   Bacteroides fragilis NOT DETECTED NOT DETECTED Final   Enterobacterales NOT DETECTED NOT DETECTED Final   Enterobacter cloacae complex NOT DETECTED NOT DETECTED Final   Escherichia coli NOT DETECTED NOT DETECTED Final   Klebsiella aerogenes NOT DETECTED NOT DETECTED Final   Klebsiella oxytoca NOT DETECTED NOT DETECTED Final   Klebsiella pneumoniae NOT DETECTED NOT DETECTED Final   Proteus species NOT DETECTED NOT DETECTED Final   Salmonella species NOT DETECTED NOT DETECTED Final   Serratia marcescens NOT DETECTED NOT DETECTED Final   Haemophilus influenzae NOT DETECTED NOT DETECTED Final   Neisseria meningitidis NOT DETECTED NOT DETECTED Final   Pseudomonas aeruginosa NOT DETECTED NOT DETECTED Final   Stenotrophomonas maltophilia NOT DETECTED NOT DETECTED Final   Candida albicans NOT DETECTED NOT DETECTED Final   Candida auris NOT DETECTED NOT DETECTED Final   Candida glabrata NOT DETECTED NOT DETECTED Final   Candida krusei NOT DETECTED NOT DETECTED Final   Candida parapsilosis NOT DETECTED NOT DETECTED Final   Candida tropicalis NOT DETECTED NOT DETECTED Final   Cryptococcus  neoformans/gattii NOT DETECTED NOT DETECTED Final    Comment: Performed at [facility], [address], [place], [place] [address]  Respiratory (~20 pathogens) panel by PCR     Status: None   Collection Time: 06/02/22  5:12 PM   Specimen: Nasopharyngeal Swab; Respiratory  Result Value Ref Range Status   Adenovirus NOT DETECTED NOT DETECTED Final   Coronavirus 229E NOT DETECTED NOT DETECTED Final    Comment: (NOTE) The Coronavirus on the Respiratory Panel, DOES NOT test for the novel  Coronavirus (2019 nCoV)    Coronavirus HKU1 NOT DETECTED NOT DETECTED Final   Coronavirus NL63 NOT DETECTED NOT DETECTED Final   Coronavirus OC43 NOT DETECTED NOT DETECTED Final   Metapneumovirus NOT DETECTED NOT DETECTED Final   Rhinovirus / Enterovirus NOT DETECTED NOT DETECTED Final   Influenza A NOT DETECTED NOT DETECTED Final   Influenza B NOT DETECTED NOT DETECTED Final   Parainfluenza Virus 1 NOT DETECTED NOT DETECTED Final   Parainfluenza Virus 2 NOT DETECTED NOT DETECTED Final   Parainfluenza Virus 3 NOT DETECTED NOT DETECTED Final   Parainfluenza Virus 4 NOT DETECTED NOT DETECTED Final   Respiratory Syncytial Virus NOT DETECTED NOT DETECTED Final   Bordetella pertussis NOT DETECTED NOT DETECTED Final   Bordetella Parapertussis NOT DETECTED NOT DETECTED Final   Chlamydophila pneumoniae NOT DETECTED NOT DETECTED Final   Mycoplasma pneumoniae NOT DETECTED NOT DETECTED Final    Comment: Performed at [facility], [place]. [address], [place], [place] [address]  Cath Tip Culture     Status: Abnormal (Preliminary result)   Collection Time: 06/04/22  5:00 PM   Specimen: Catheter Tip; Other  Result Value Ref Range Status   Specimen Description CATH TIP  Final   Special Requests NONE  Final   Culture (A)  Final    500 COLONIES/mL STAPHYLOCOCCUS SCHLEIFERI SUSCEPTIBILITIES TO FOLLOW Performed at [facility], [place] [address] [place], [place] [address]    Report Status PENDING   Incomplete  Culture, blood (Routine X 2) w Reflex to ID Panel     Status: None (Preliminary result)   Collection Time: 06/05/22 10:31 AM   Specimen: BLOOD RIGHT ARM  Result Value Ref Range Status   Specimen Description BLOOD RIGHT ARM  Final   Special Requests   Final    BOTTLES DRAWN AEROBIC AND ANAEROBIC Blood Culture adequate volume   Culture   Final    NO GROWTH 2 DAYS Performed at [facility], [address] [place], [place] [address]    Report Status PENDING  Incomplete  Culture, blood (Routine X 2) w Reflex to ID Panel     Status: None (Preliminary result)   Collection Time: 06/05/22 10:36 AM   Specimen: BLOOD LEFT ARM  Result Value Ref Range Status   Specimen Description BLOOD LEFT ARM  Final   Special Requests   Final    BOTTLES DRAWN AEROBIC AND ANAEROBIC Blood Culture adequate volume   Culture   Final    NO GROWTH 2 DAYS Performed at [facility], [place] [address], [place], [place] [address]    Report Status PENDING  Incomplete     Time coordinating discharge: Over 30 minutes  SIGNED:   Phillips Climes, MD  [facility] 06/07/2022, 11:42 AM Pager   If 7PM-7AM, please contact night-coverage [email or web address]

## 2022-06-07 NOTE — Progress Notes (Signed)
[facility] Progress Note   Subjective:    Seen and examined patient at bedside. Tolerated yesterday's HD and removed almost 1L. He denies any acute issues. He is going home today.  Objective Vitals:   06/06/22 1600 06/06/22 1934 06/07/22 0400 06/07/22 0900  BP: (!) 77/67 (!) 84/44 108/61 (!) 87/54  Pulse: 78 81 84 77  Resp: 20 18  18   Temp: 97.8 F (36.6 C) 98.7 F (37.1 C) 97.6 F (36.4 C) 97.7 F (36.5 C)  TempSrc: Oral Oral Oral Oral  SpO2: 100%  96% 96%  Weight:       Physical Exam General:WDWN male in NAD Heart:RRR, no mrg Lungs:CTAB, nml WOB on RA Abdomen:soft, NTND, +ostomy Extremities:no LE edema Dialysis Access: L thigh AVG   Filed Weights   06/04/22 0830 06/04/22 1143 06/06/22 0735  Weight: 74.2 kg 74.2 kg 72.9 kg    Intake/Output Summary (Last 24 hours) at 06/07/2022 1251 Last data filed at 06/06/2022 1700 Gross per 24 hour  Intake 300 ml  Output --  Net 300 ml    Additional Objective Labs: Basic Metabolic Panel: Recent Labs  Lab 06/05/22 0501 06/06/22 0452 06/07/22 0223  NA 135 135 137  K 4.5 4.2 4.3  CL 95* 99 98  CO2 25 23 26   GLUCOSE 98 96 114*  BUN 52* 67* 38*  CREATININE 8.51* 10.22* 6.77*  CALCIUM 8.5* 8.5* 8.7*  PHOS 6.4* 8.5* 4.9*   Liver Function Tests: Recent Labs  Lab 06/02/22 1215 06/03/22 0500 06/05/22 0501 06/06/22 0452 06/07/22 0223  AST 49*  --   --   --   --   ALT 43  --   --   --   --   ALKPHOS 52  --   --   --   --   BILITOT 1.3*  --   --   --   --   PROT 6.7  --   --   --   --   ALBUMIN 3.5   < > 2.7* 2.8* 2.8*   < > = values in this interval not displayed.   No results for input(s): "LIPASE", "AMYLASE" in the last 168 hours. CBC: Recent Labs  Lab 06/02/22 1215 06/03/22 0500 06/04/22 0844 06/05/22 0501 06/06/22 0756  WBC 7.8 6.0 7.1 5.1 6.3  NEUTROABS 6.6  --  5.4  --   --   HGB 8.9* 8.1* 8.2* 8.0* 8.2*  HCT 27.0* 25.6* 25.2* 24.2* 24.0*  MCV 122.2* 124.3* 121.7* 121.0* 118.2*   PLT 93* 87* 99* 100* 110*   Blood Culture    Component Value Date/Time   SDES BLOOD LEFT ARM 06/05/2022 1036   SPECREQUEST  06/05/2022 1036    BOTTLES DRAWN AEROBIC AND ANAEROBIC Blood Culture adequate volume   CULT  06/05/2022 1036    NO GROWTH 2 DAYS Performed at [facility] [address] [place], [place] [address]    REPTSTATUS PENDING 06/05/2022 1036    Cardiac Enzymes: No results for input(s): "CKTOTAL", "CKMB", "CKMBINDEX", "TROPONINI" in the last 168 hours. CBG: No results for input(s): "GLUCAP" in the last 168 hours. Iron Studies: No results for input(s): "IRON", "TIBC", "TRANSFERRIN", "FERRITIN" in the last 72 hours. Lab Results  Component Value Date   INR 1.3 (H) 06/02/2022   Studies/Results: No results found.  Medications:   ceFAZolin (ANCEF) IV Stopped (06/06/22 1500)    apixaban  2.5 mg Oral BID   calcitRIOL  1.25 mcg Oral Q M,W,F-HD  Chlorhexidine Gluconate Cloth  6 each Topical Q0600   latanoprost  1 drop Right Eye QHS   midodrine  15 mg Oral TID WC   pregabalin  50 mg Oral Daily   sevelamer carbonate  2,400 mg Oral TID WC   sodium chloride flush  3 mL Intravenous Q12H   sodium zirconium cyclosilicate  10 g Oral Once per day on Sun Tue Thu Sat   timolol  1 drop Both Eyes Daily    Dialysis Orders: MWF NW 2h 37mn  350/ 1.5   74.5kg   2/2.5 bath  Heparin 5000 - last HD 11/27, post wt 75.2 - rocaltrol 1.25 ug po tiw - mircera 150 ug q2, last 11/27, due 12/11  Assessment/Plan: Sepsis - high fevers in ED. CXR w/ no acute findings. +[facility]acte[facility] which has been [facility] on 06/04/[facility]. Repeat blood cx done 11/30-awaiting final results. Bacteremia - BC+staph.On ABX. [place]29 for line holiday.  Appreciate VVS assistance. ID following. On Vanc. Discussed with ID NP: plan for patient to be discharged on 2GM Cefazolin IV X 4 weeks with HD end date 07/02/22. [facility] so medication was ordered yesterday and scheduled for delivery by Monday 12/4. Per ID and Pharmacy, we can use Vancomycin [id number] with HD until the Cefazolin is delivered. ID to keep to eye out of final report of repeat blood cultures to ensure no re-growth. We will keep an eye out as well. Chronic hypotension - takes 15 mg tid of midodrine at home.  AMS - multifactorial most likely, acute infection, esrd, etc. Back to baseline.  ESRD - on HD MWF. On HD per regular schedule using L thigh AVG. HD access - Dr DiScot Dockid redo of L thigh AVG on 05/06/22. [place] Br[place]ula Sladeesterday and is ready for use.  TDC removed. AVG was working well here.    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 Follow.  MBD ckd - CCa and phos in range. 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

## 2022-06-07 NOTE — Discharge Instructions (Signed)
Follow with Primary MD Marin Olp, MD in 14 days   Get CBC, CMP,  checked  by Primary MD next visit.    Activity: As tolerated with Full fall precautions use walker/cane & assistance as needed   Disposition Home    Diet: Renal Diet .   On your next visit with your primary care physician please Get Medicines reviewed and adjusted.   Please request your Prim.MD to go over all Hospital Tests and Procedure/Radiological results at the follow up, please get all Hospital records sent to your Prim MD by signing hospital release before you go home.   If you experience worsening of your admission symptoms, develop shortness of breath, life threatening emergency, suicidal or homicidal thoughts you must seek medical attention immediately by calling 911 or calling your MD immediately  if symptoms less severe.  You Must read complete instructions/literature along with all the possible adverse reactions/side effects for all the Medicines you take and that have been prescribed to you. Take any new Medicines after you have completely understood and accpet all the possible adverse reactions/side effects.   Do not drive, operating heavy machinery, perform activities at heights, swimming or participation in water activities or provide baby sitting services if your were admitted for syncope or siezures until you have seen by Primary MD or a Neurologist and advised to do so again.  Do not drive when taking Pain medications.    Do not take more than prescribed Pain, Sleep and Anxiety Medications  Special Instructions: If you have smoked or chewed Tobacco  in the last 2 yrs please stop smoking, stop any regular Alcohol  and or any Recreational drug use.  Wear Seat belts while driving.   Please note  You were cared for by a hospitalist during your hospital stay. If you have any questions about your discharge medications or the care you received while you were in the hospital after you are  discharged, you can call the unit and asked to speak with the hospitalist on call if the hospitalist that took care of you is not available. Once you are discharged, your primary care physician will handle any further medical issues. Please note that NO REFILLS for any discharge medications will be authorized once you are discharged, as it is imperative that you return to your primary care physician (or establish a relationship with a primary care physician if you do not have one) for your aftercare needs so that they can reassess your need for medications and monitor your lab values.

## 2022-06-07 NOTE — Progress Notes (Signed)
PT Cancellation Note  Patient Details Name: Joshua Riley MRN: [id number] DOB: Dec 10, 1935   Cancelled Treatment:    Reason Eval/Treat Not Completed: Patient declined, no reason specified On arrival to room pt up and dressed and ambulating around room to gather personal items. Pt declined session and reports no questions or concerns about mobility.    Allena Katz 06/07/2022, 10:58 AM

## 2022-06-08 LAB — CATH TIP CULTURE: Culture: 500 — AB

## 2022-06-09 ENCOUNTER — Telehealth: Payer: Self-pay

## 2022-06-09 NOTE — Progress Notes (Signed)
Late Note Entry  Pt was d/c to home on Saturday. Contacted [facility] to advise clinic of pt's d/c date and that pt should have resumed care today. Clinic aware of pt's iv antibiotic needs.   Melven Sartorius Renal Navigator [phone]

## 2022-06-09 NOTE — Telephone Encounter (Signed)
Transition Care Management Follow-up Telephone Call Date of discharge and from where: [facility] 06-07-22 Dx: Sepsis How have you been since you were released from the hospital? Alive and breathing at the moment  Any questions or concerns? No  Items Reviewed: Did the pt receive and understand the discharge instructions provided? Yes  Medications obtained and verified? Yes  Other? No  Any new allergies since your discharge? No  Dietary orders reviewed? Yes Do you have support at home? Yes   Home Care and Equipment/Supplies: Were home health services ordered? no If so, what is the name of the agency? na  Has the agency set up a time to come to the patient's home? not applicable Were any new equipment or medical supplies ordered?  No What is the name of the medical supply agency? na Were you able to get the supplies/equipment? not applicable Do you have any questions related to the use of the equipment or supplies? No  Functional Questionnaire: (I = Independent and D = Dependent) ADLs: I  Bathing/Dressing- I  Meal Prep- I  Eating- I  Maintaining continence- I  Transferring/Ambulation- I  Managing Meds- I  Follow up appointments reviewed:  PCP Hospital f/u appt confirmed? Yes  [facility] Yong Channel  [facility]  . Are transportation arrangements needed? No  If their condition worsens, is the pt aware to call PCP or go to the Emergency Dept.? Yes Was the patient provided with contact information for the PCP's office or ED? Yes Was to pt encouraged to call back with questions or concerns? Yes   Juanda Crumble LPN [place] Direct Dial [phone]

## 2022-06-09 NOTE — Telephone Encounter (Signed)
Noted thanks °

## 2022-06-10 LAB — CULTURE, BLOOD (ROUTINE X 2)
Culture: NO GROWTH
Culture: NO GROWTH
Special Requests: ADEQUATE
Special Requests: ADEQUATE

## 2022-06-17 ENCOUNTER — Inpatient Hospital Stay: Payer: Medicare Other | Admitting: Family Medicine

## 2022-06-17 ENCOUNTER — Telehealth: Payer: Self-pay | Admitting: Cardiology
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
## 2022-06-17 NOTE — Telephone Encounter (Signed)
Patient requests to reschedule cath the first part of January 2024. Last office visit with Dr Johney Frame 05/22/22-pt aware he will need office visit prior to rescheduling cath.  Patient has already scheduled appointment with Christen Bame, NP 07/03/22 at 10:30 AM. Patient will  plan to keep that appointment to update H&P/BMP/CBC/EKG prior to getting Cardiac Cath rescheduled first part of January 2024.

## 2022-06-17 NOTE — Telephone Encounter (Signed)
Patient stated he is ready to reschedule Cardiac Cath procedure.

## 2022-06-26 ENCOUNTER — Encounter: Payer: Self-pay | Admitting: Family Medicine

## 2022-06-26 ENCOUNTER — Ambulatory Visit (INDEPENDENT_AMBULATORY_CARE_PROVIDER_SITE_OTHER): Payer: Medicare Other | Admitting: Family Medicine

## 2022-06-26 VITALS — BP 110/60 | HR 92 | Temp 98.1°F | Ht 68.5 in | Wt 167.2 lb

## 2022-06-26 DIAGNOSIS — N186 End stage renal disease: Secondary | ICD-10-CM | POA: Diagnosis not present

## 2022-06-26 DIAGNOSIS — Z8619 Personal history of other infectious and parasitic diseases: Secondary | ICD-10-CM | POA: Diagnosis not present

## 2022-06-26 DIAGNOSIS — I951 Orthostatic hypotension: Secondary | ICD-10-CM | POA: Diagnosis not present

## 2022-06-26 DIAGNOSIS — R7881 Bacteremia: Secondary | ICD-10-CM

## 2022-06-26 DIAGNOSIS — G609 Hereditary and idiopathic neuropathy, unspecified: Secondary | ICD-10-CM

## 2022-06-26 DIAGNOSIS — I4819 Other persistent atrial fibrillation: Secondary | ICD-10-CM

## 2022-06-26 NOTE — Patient Instructions (Addendum)
No changes today. No labs today since dialysis is doing those.   Finish antibiotics- thrilled you are doing better  Recommended follow up: Return for next already scheduled visit or sooner if needed.
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
## 2022-07-01 ENCOUNTER — Telehealth: Payer: Self-pay | Admitting: Family Medicine

## 2022-07-01 ENCOUNTER — Ambulatory Visit: Payer: Medicare Other | Admitting: Podiatry

## 2022-07-01 NOTE — Telephone Encounter (Signed)
Patient states: -He requested this medication refill at last [facility]:  06/26/22  NEXT APPOINTMENT DATE: 08/14/22  MEDICATION: Nystatin tropical powder 100,000 usp  Is the patient out of medication? Yes  PHARMACY: [id number] [id number]: [phone] Fax: [phone]

## 2022-07-01 NOTE — Telephone Encounter (Signed)
Ok to fill since wasn't filled at visit?
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
## 2022-07-03 ENCOUNTER — Encounter: Payer: Self-pay | Admitting: Nurse Practitioner

## 2022-07-03 ENCOUNTER — Encounter: Payer: Self-pay | Admitting: Podiatry

## 2022-07-03 ENCOUNTER — Ambulatory Visit (INDEPENDENT_AMBULATORY_CARE_PROVIDER_SITE_OTHER): Payer: Medicare Other | Admitting: Podiatry

## 2022-07-03 ENCOUNTER — Ambulatory Visit: Payer: Medicare Other | Attending: Nurse Practitioner | Admitting: Nurse Practitioner

## 2022-07-03 VITALS — BP 98/58 | HR 70 | Ht 68.5 in | Wt 172.6 lb

## 2022-07-03 DIAGNOSIS — I480 Paroxysmal atrial fibrillation: Secondary | ICD-10-CM | POA: Diagnosis not present

## 2022-07-03 DIAGNOSIS — Z7901 Long term (current) use of anticoagulants: Secondary | ICD-10-CM | POA: Diagnosis present

## 2022-07-03 DIAGNOSIS — I953 Hypotension of hemodialysis: Secondary | ICD-10-CM | POA: Diagnosis not present

## 2022-07-03 DIAGNOSIS — I951 Orthostatic hypotension: Secondary | ICD-10-CM | POA: Insufficient documentation

## 2022-07-03 DIAGNOSIS — M722 Plantar fascial fibromatosis: Secondary | ICD-10-CM | POA: Diagnosis not present

## 2022-07-03 DIAGNOSIS — N186 End stage renal disease: Secondary | ICD-10-CM | POA: Insufficient documentation

## 2022-07-03 DIAGNOSIS — R0609 Other forms of dyspnea: Secondary | ICD-10-CM | POA: Insufficient documentation

## 2022-07-03 MED ORDER — TRIAMCINOLONE ACETONIDE 10 MG/ML IJ SUSP
20.0000 mg | Freq: Once | INTRAMUSCULAR | Status: AC
  Administered 2022-07-03: 20 mg

## 2022-07-03 NOTE — Progress Notes (Signed)
Subjective:   Patient ID: Joshua Riley, male   DOB: 86 y.o.   MRN: [id number]   HPI Patient states both of his heels are hurting and he is concerned about having neuropathy   ROS      Objective:  Physical Exam  Neurovascular status was found to be intact patient is found to have discomfort of the plantar heel region bilateral with fluid buildup around the medial band at the insertional point of the tendon into the calcaneus     Assessment:  Acute Planter fasciitis bilateral possibility for neuropathic discomfort     Plan:  Would rather focus on the acute inflammation versus consideration of medicines long-term so I did discuss acute fasciitis I did sterile prep injected the fascial band bilateral at insertion calcaneus 3 mg Kenalog 5 mg Xylocaine applied sterile dressing reappoint if symptoms indicate

## 2022-07-03 NOTE — Patient Instructions (Addendum)
Medication Instructions:   Your physician recommends that you continue on your current medications as directed. Please refer to the Current Medication list given to you today.   *If you need a refill on your cardiac medications before your next appointment, please call your pharmacy*  Lab Work:  Your physician recommends that you return for lab work on Thursday, January 11.  You can come in on the day of your appointment anytime between 7:30-4:30   If you have labs (blood work) drawn today and your tests are completely normal, you will receive your results only by: [place] (if you have MyChart) OR A paper copy in the mail If you have any lab test that is abnormal or we need to change your treatment, we will call you to review the results.   Testing/Procedures:       Cardiac/Peripheral Catheterization   You are scheduled for a Cardiac Catheterization on Tuesday, January 16 with Dr. Peter Martinique.  1. Please arrive at the Main Entrance A at [facility]: [place], [place] [address] on January 16 at 7:00 AM (This time is two hours before your procedure to ensure your preparation). Free valet parking service is available. You will check in at ADMITTING. The support person will be asked to wait in the waiting room.  It is OK to have someone drop you off and come back when you are ready to be discharged.        Special note: Every effort is made to have your procedure done on time. Please understand that emergencies sometimes delay scheduled procedures.   . 2. Diet: Do not eat solid foods after midnight.  You may have clear liquids until 5 AM the day of the procedure.  3. Labs: You will need to have blood drawn on Friday, January 12 at [facility]. [address]  Open: 7:30am - 5pm    Phone: [phone]. You do not need to be fasting.  4. Medication instructions in preparation for your procedure:    Contrast Allergy: No  Stop taking Eliquis two days prior on Sunday, January 14.   On the morning of your procedure, take Aspirin 81 mg and any morning medicines NOT listed above.  You may use sips of water.  5. Plan to go home the same day, you will only stay overnight if medically necessary. 6. You MUST have a responsible adult to drive you home. 7. An adult MUST be with you the first 24 hours after you arrive home. 8. Bring a current list of your medications, and the last time and date medication taken. 9. Bring ID and current insurance cards. 10.Please wear clothes that are easy to get on and off and wear slip-on shoes.  Thank you for allowing [place] to care for you!   -- [place] [facility]    Follow-Up: At [facility], you and your health needs are our priority.  As part of our continuing mission to provide you with exceptional heart care, we have created designated Provider Care Teams.  These Care Teams include your primary Cardiologist (physician) and Advanced Practice Providers (APPs -  Physician Assistants and Nurse Practitioners) who all work together to provide you with the care you need, when you need it.  We recommend signing up for the patient portal called "MyChart".  Sign up information is provided on this After Visit Summary.  MyChart is used to connect with  patients for Virtual Visits (Telemedicine).  Patients are able to view lab/test results, encounter notes, upcoming appointments, etc.  Non-urgent messages can be sent to your provider as well.   To learn more about what you can do with MyChart, go to [email or web address].    Your next appointment:   5 week(s)  The format for your next appointment:   In Person  Provider:   Gwyndolyn Kaufman, MD  Important Information About Sugar

## 2022-07-04 NOTE — Telephone Encounter (Signed)
Yes thanks- you can refill with last instructions

## 2022-07-08 ENCOUNTER — Other Ambulatory Visit: Payer: Medicare Other

## 2022-07-08 MED ORDER — NYSTATIN 100000 UNIT/GM EX POWD: 1.0000 | Freq: Three times a day (TID) | CUTANEOUS | 0 refills | Status: DC

## 2022-07-08 NOTE — Telephone Encounter (Signed)
Refill sent to pharmacy.   

## 2022-07-10 ENCOUNTER — Other Ambulatory Visit: Payer: Self-pay

## 2022-07-10 MED ORDER — NYSTATIN 100000 UNIT/GM EX POWD: 1.0000 | Freq: Three times a day (TID) | CUTANEOUS | 4 refills | Status: DC

## 2022-07-17 ENCOUNTER — Ambulatory Visit: Payer: Medicare Other | Attending: Cardiology

## 2022-07-17 DIAGNOSIS — R0609 Other forms of dyspnea: Secondary | ICD-10-CM

## 2022-07-17 DIAGNOSIS — I480 Paroxysmal atrial fibrillation: Secondary | ICD-10-CM

## 2022-07-17 LAB — CBC
Hematocrit: 34.3 % — ABNORMAL LOW (ref 37.5–51.0)
Hemoglobin: 11.6 g/dL — ABNORMAL LOW (ref 13.0–17.7)
MCH: 38 pg — ABNORMAL HIGH (ref 26.6–33.0)
MCHC: 33.8 g/dL (ref 31.5–35.7)
MCV: 113 fL — ABNORMAL HIGH (ref 79–97)
Platelets: 144 10*3/uL — ABNORMAL LOW (ref 150–450)
RBC: 3.05 x10E6/uL — ABNORMAL LOW (ref 4.14–5.80)
RDW: 14.3 % (ref 11.6–15.4)
WBC: 8.1 10*3/uL (ref 3.4–10.8)

## 2022-07-17 LAB — BASIC METABOLIC PANEL
BUN/Creatinine Ratio: 4 — ABNORMAL LOW (ref 10–24)
BUN: 37 mg/dL — ABNORMAL HIGH (ref 8–27)
CO2: 29 mmol/L (ref 20–29)
Calcium: 10.2 mg/dL (ref 8.6–10.2)
Chloride: 96 mmol/L (ref 96–106)
Creatinine, Ser: 8.65 mg/dL — ABNORMAL HIGH (ref 0.76–1.27)
Glucose: 69 mg/dL — ABNORMAL LOW (ref 70–99)
Potassium: 4.9 mmol/L (ref 3.5–5.2)
Sodium: 139 mmol/L (ref 134–144)
eGFR: 6 mL/min/{1.73_m2} — ABNORMAL LOW (ref >59)

## 2022-07-18 ENCOUNTER — Other Ambulatory Visit: Payer: Medicare Other

## 2022-07-21 ENCOUNTER — Telehealth: Payer: Self-pay | Admitting: *Deleted

## 2022-07-21 NOTE — Telephone Encounter (Signed)
Cardiac Catheterization scheduled at [facility] for: Tuesday July 22, 2022 9 AM Arrival time and place: [place] Entrance A at: 7 AM  Nothing to eat after midnight prior to procedure, clear liquids until 5 AM day of procedure.  Medication instructions: -Hold:  Eliquis-none 07/20/22 until post procedure -Except hold medications usual morning medications can be taken with sips of water including aspirin 81 mg.  Confirmed patient has responsible adult to drive home post procedure and be with patient first 24 hours after arriving home.  Patient reports no new symptoms concerning for COVID-19 in the past 10 days.  Reviewed procedure instructions with patient.

## 2022-07-22 ENCOUNTER — Ambulatory Visit (HOSPITAL_COMMUNITY)
Admission: RE | Admit: 2022-07-22 | Discharge: 2022-07-22 | Disposition: A | Discharge status: Home / Self Care | Payer: Medicare Other | Attending: Cardiology | Admitting: Cardiology

## 2022-07-22 ENCOUNTER — Other Ambulatory Visit: Payer: Self-pay

## 2022-07-22 ENCOUNTER — Encounter (HOSPITAL_COMMUNITY): Payer: Self-pay | Admitting: Cardiology

## 2022-07-22 ENCOUNTER — Encounter (HOSPITAL_COMMUNITY)
Admission: RE | Disposition: A | Discharge status: Home / Self Care | Payer: Self-pay | Source: Home / Self Care | Attending: Cardiology

## 2022-07-22 DIAGNOSIS — I480 Paroxysmal atrial fibrillation: Secondary | ICD-10-CM | POA: Diagnosis not present

## 2022-07-22 DIAGNOSIS — R079 Chest pain, unspecified: Secondary | ICD-10-CM | POA: Diagnosis not present

## 2022-07-22 DIAGNOSIS — Z86718 Personal history of other venous thrombosis and embolism: Secondary | ICD-10-CM | POA: Insufficient documentation

## 2022-07-22 DIAGNOSIS — R0602 Shortness of breath: Secondary | ICD-10-CM

## 2022-07-22 DIAGNOSIS — I120 Hypertensive chronic kidney disease with stage 5 chronic kidney disease or end stage renal disease: Secondary | ICD-10-CM | POA: Insufficient documentation

## 2022-07-22 DIAGNOSIS — I951 Orthostatic hypotension: Secondary | ICD-10-CM | POA: Diagnosis not present

## 2022-07-22 DIAGNOSIS — Z992 Dependence on renal dialysis: Secondary | ICD-10-CM | POA: Insufficient documentation

## 2022-07-22 DIAGNOSIS — R0609 Other forms of dyspnea: Secondary | ICD-10-CM | POA: Diagnosis not present

## 2022-07-22 DIAGNOSIS — Z7901 Long term (current) use of anticoagulants: Secondary | ICD-10-CM | POA: Insufficient documentation

## 2022-07-22 DIAGNOSIS — I4891 Unspecified atrial fibrillation: Secondary | ICD-10-CM | POA: Diagnosis not present

## 2022-07-22 DIAGNOSIS — Z87891 Personal history of nicotine dependence: Secondary | ICD-10-CM | POA: Insufficient documentation

## 2022-07-22 DIAGNOSIS — N186 End stage renal disease: Secondary | ICD-10-CM | POA: Diagnosis not present

## 2022-07-22 HISTORY — PX: LEFT HEART CATH AND CORONARY ANGIOGRAPHY: CATH118249

## 2022-07-22 SURGERY — LEFT HEART CATH AND CORONARY ANGIOGRAPHY
Anesthesia: LOCAL

## 2022-07-22 MED ORDER — FENTANYL CITRATE (PF) 100 MCG/2ML IJ SOLN
INTRAMUSCULAR | Status: AC
  Filled 2022-07-22: qty 2

## 2022-07-22 MED ORDER — LABETALOL HCL 5 MG/ML IV SOLN: 10.0000 mg | INTRAVENOUS | Status: DC | PRN

## 2022-07-22 MED ORDER — HYDRALAZINE HCL 20 MG/ML IJ SOLN: 10.0000 mg | INTRAMUSCULAR | Status: DC | PRN

## 2022-07-22 MED ORDER — LIDOCAINE HCL (PF) 1 % IJ SOLN
INTRAMUSCULAR | Status: DC | PRN
  Administered 2022-07-22: 2 mL

## 2022-07-22 MED ORDER — HEPARIN SODIUM (PORCINE) 1000 UNIT/ML IJ SOLN
INTRAMUSCULAR | Status: DC | PRN
  Administered 2022-07-22: 4000 [IU] via INTRAVENOUS

## 2022-07-22 MED ORDER — SODIUM CHLORIDE 0.9% FLUSH: 3.0000 mL | INTRAVENOUS | Status: DC | PRN

## 2022-07-22 MED ORDER — HEPARIN (PORCINE) IN NACL 1000-0.9 UT/500ML-% IV SOLN
INTRAVENOUS | Status: DC | PRN
  Administered 2022-07-22 (×2): 500 mL

## 2022-07-22 MED ORDER — HEPARIN SODIUM (PORCINE) 1000 UNIT/ML IJ SOLN
INTRAMUSCULAR | Status: AC
  Filled 2022-07-22: qty 10

## 2022-07-22 MED ORDER — HEPARIN (PORCINE) IN NACL 1000-0.9 UT/500ML-% IV SOLN
INTRAVENOUS | Status: AC
  Filled 2022-07-22: qty 500

## 2022-07-22 MED ORDER — SODIUM CHLORIDE 0.9% FLUSH: 3.0000 mL | Freq: Two times a day (BID) | INTRAVENOUS | Status: DC

## 2022-07-22 MED ORDER — MIDAZOLAM HCL 2 MG/2ML IJ SOLN
INTRAMUSCULAR | Status: AC
  Filled 2022-07-22: qty 2

## 2022-07-22 MED ORDER — MIDAZOLAM HCL 2 MG/2ML IJ SOLN
INTRAMUSCULAR | Status: DC | PRN
  Administered 2022-07-22: 1 mg via INTRAVENOUS

## 2022-07-22 MED ORDER — FENTANYL CITRATE (PF) 100 MCG/2ML IJ SOLN
INTRAMUSCULAR | Status: DC | PRN
  Administered 2022-07-22: 25 ug via INTRAVENOUS

## 2022-07-22 MED ORDER — SODIUM CHLORIDE 0.9 % IV SOLN: 250.0000 mL | INTRAVENOUS | Status: DC | PRN

## 2022-07-22 MED ORDER — ONDANSETRON HCL 4 MG/2ML IJ SOLN: 4.0000 mg | Freq: Four times a day (QID) | INTRAMUSCULAR | Status: DC | PRN

## 2022-07-22 MED ORDER — ASPIRIN 81 MG PO CHEW: 81.0000 mg | CHEWABLE_TABLET | ORAL | Status: DC

## 2022-07-22 MED ORDER — ACETAMINOPHEN 325 MG PO TABS: 650.0000 mg | ORAL_TABLET | ORAL | Status: DC | PRN

## 2022-07-22 MED ORDER — SODIUM CHLORIDE 0.9 % IV SOLN: INTRAVENOUS | Status: DC

## 2022-07-22 MED ORDER — VERAPAMIL HCL 2.5 MG/ML IV SOLN
INTRAVENOUS | Status: DC | PRN
  Administered 2022-07-22: 10 mL via INTRA_ARTERIAL

## 2022-07-22 MED ORDER — IOHEXOL 350 MG/ML SOLN
INTRAVENOUS | Status: DC | PRN
  Administered 2022-07-22: 50 mL

## 2022-07-22 SURGICAL SUPPLY — 12 items
CATH 5FR JL3.5 JR4 ANG PIG MP (CATHETERS) IMPLANT
CATH INFINITI 5FR JL4 (CATHETERS) IMPLANT
DEVICE RAD COMP TR BAND LRG (VASCULAR PRODUCTS) IMPLANT
GLIDESHEATH SLEND SS 6F .021 (SHEATH) IMPLANT
GUIDEWIRE INQWIRE 1.5J.035X260 (WIRE) IMPLANT
INQWIRE 1.5J .035X260CM (WIRE) ×1
KIT HEART LEFT (KITS) ×1 IMPLANT
PACK CARDIAC CATHETERIZATION (CUSTOM PROCEDURE TRAY) ×1 IMPLANT
SHEATH PROBE COVER 6X72 (BAG) IMPLANT
SYR MEDRAD MARK 7 150ML (SYRINGE) ×1 IMPLANT
TRANSDUCER W/STOPCOCK (MISCELLANEOUS) ×1 IMPLANT
TUBING CIL FLEX 10 FLL-RA (TUBING) ×1 IMPLANT

## 2022-07-22 NOTE — Discharge Instructions (Signed)
Radial Site Care  This sheet gives you information about how to care for yourself after your procedure. Your health care provider may also give you more specific instructions. If you have problems or questions, contact your health care provider. What can I expect after the procedure? After the procedure, it is common to have: Bruising and tenderness at the catheter insertion area. Follow these instructions at home: Medicines Take over-the-counter and prescription medicines only as told by your health care provider. Insertion site care Follow instructions from your health care provider about how to take care of your insertion site. Make sure you: Wash your hands with soap and water before you remove your bandage (dressing). If soap and water are not available, use hand sanitizer. May remove dressing in 24 hours. Check your insertion site every day for signs of infection. Check for: Redness, swelling, or pain. Fluid or blood. Pus or a bad smell. Warmth. Do no take baths, swim, or use a hot tub for 5 days. You may shower 24-48 hours after the procedure. Remove the dressing and gently wash the site with plain soap and water. Pat the area dry with a clean towel. Do not rub the site. That could cause bleeding. Do not apply powder or lotion to the site. Activity  For 24 hours after the procedure, or as directed by your health care provider: Do not flex or bend the affected arm. Do not push or pull heavy objects with the affected arm. Do not drive yourself home from the hospital or clinic. You may drive 24 hours after the procedure. Do not operate machinery or power tools. KEEP ARM ELEVATED THE REMAINDER OF THE DAY. Do not push, pull or lift anything that is heavier than 10 lb for 5 days. Ask your health care provider when it is okay to: Return to work or school. Resume usual physical activities or sports. Resume sexual activity. General instructions If the catheter site starts to  bleed, raise your arm and put firm pressure on the site. If the bleeding does not stop, get help right away. This is a medical emergency. DRINK PLENTY OF FLUIDS FOR THE NEXT 2-3 DAYS. No alcohol consumption for 24 hours after receiving sedation. If you went home on the same day as your procedure, a responsible adult should be with you for the first 24 hours after you arrive home. Keep all follow-up visits as told by your health care provider. This is important. Contact a health care provider if: You have a fever. You have redness, swelling, or yellow drainage around your insertion site. Get help right away if: You have unusual pain at the radial site. The catheter insertion area swells very fast. The insertion area is bleeding, and the bleeding does not stop when you hold steady pressure on the area. Your arm or hand becomes pale, cool, tingly, or numb. These symptoms may represent a serious problem that is an emergency. Do not wait to see if the symptoms will go away. Get medical help right away. Call your local emergency services (911 in the [place] Do not drive yourself to the hospital. Summary After the procedure, it is common to have bruising and tenderness at the site. Follow instructions from your health care provider about how to take care of your radial site wound. Check the wound every day for signs of infection.  This information is not intended to replace advice given to you by your health care provider. Make sure you discuss any questions you have with   your health care provider. Document Revised: 07/29/2017 Document Reviewed: 07/29/2017 Elsevier Patient Education  2020 Elsevier Inc.  

## 2022-07-22 NOTE — Interval H&P Note (Signed)
History and Physical Interval Note:  07/22/2022 7:26 AM  Joshua Riley  has presented today for surgery, with the diagnosis of Chest pain and SOB.  The various methods of treatment have been discussed with the patient and family. After consideration of risks, benefits and other options for treatment, the patient has consented to  Procedure(s): LEFT HEART CATH AND CORONARY ANGIOGRAPHY (N/A) as a surgical intervention.  The patient's history has been reviewed, patient examined, no change in status, stable for surgery.  I have reviewed the patient's chart and labs.  Questions were answered to the patient's satisfaction.   Cath Lab Visit (complete for each Cath Lab visit)  Clinical Evaluation Leading to the Procedure:   ACS: No.  Non-ACS:    Anginal Classification: CCS II  Anti-ischemic medical therapy: No Therapy  Non-Invasive Test Results: No non-invasive testing performed  Prior CABG: No previous CABG        [name] [facility] 07/22/2022 7:26 AM

## 2022-07-29 ENCOUNTER — Other Ambulatory Visit: Payer: Self-pay | Admitting: Cardiology

## 2022-07-29 ENCOUNTER — Ambulatory Visit (HOSPITAL_COMMUNITY)
Admission: RE | Admit: 2022-07-29 | Discharge: 2022-07-29 | Disposition: A | Discharge status: Home / Self Care | Payer: Medicare Other | Source: Ambulatory Visit | Attending: Urology | Admitting: Urology

## 2022-07-29 DIAGNOSIS — R0602 Shortness of breath: Secondary | ICD-10-CM

## 2022-07-29 DIAGNOSIS — Z436 Encounter for attention to other artificial openings of urinary tract: Secondary | ICD-10-CM | POA: Insufficient documentation

## 2022-07-29 DIAGNOSIS — I4891 Unspecified atrial fibrillation: Secondary | ICD-10-CM

## 2022-07-29 DIAGNOSIS — N1339 Other hydronephrosis: Secondary | ICD-10-CM | POA: Diagnosis not present

## 2022-07-29 HISTORY — PX: IR NEPHROSTOMY EXCHANGE LEFT: IMG6069

## 2022-07-29 MED ORDER — LIDOCAINE HCL 1 % IJ SOLN
INTRAMUSCULAR | Status: AC
  Filled 2022-07-29: qty 20

## 2022-07-29 MED ORDER — IOHEXOL 300 MG/ML  SOLN
50.0000 mL | Freq: Once | INTRAMUSCULAR | Status: AC | PRN
  Administered 2022-07-29: 10 mL

## 2022-07-29 NOTE — Procedures (Signed)
Vascular and Interventional Radiology Procedure Note  Patient: Joshua Riley DOB: 1935/12/19 Medical Record Number: [id number] Note Date/Time: 07/29/22 4:47 PM   Performing Physician: Michaelle Birks, MD Assistant(s): None  Diagnosis: Routine exchange.  Procedure:  LEFT NEPHROSTOMY TUBE EXCHANGE LEFT ANTEROGRADE NEPHROSTOGRAM  Anesthesia: Local Anesthetic Complications: None Estimated Blood Loss:  0 mL Specimens:  None  Findings:  Successful exchange for an  8.5  F nephrostomy tube into the left kidney(s).   Plan: Resume previous  care. Follow up for routine nephrostomy tube exchange in 10-12 week(s).   See detailed procedure note with images in PACS. The patient tolerated the procedure well without incident or complication and was returned to Recovery in stable condition.    Michaelle Birks, MD [facility] Inter[facility]   Pager. [place]

## 2022-07-31 ENCOUNTER — Other Ambulatory Visit: Payer: Self-pay | Admitting: Family Medicine

## 2022-07-31 MED ORDER — PREGABALIN 50 MG PO CAPS: 50.0000 mg | ORAL_CAPSULE | Freq: Every day | ORAL | 3 refills | Status: DC

## 2022-07-31 NOTE — Telephone Encounter (Signed)
..  Encourage patient to contact the pharmacy for refills or they can request refills through [place]:  Please schedule appointment if longer than 1 year  NEXT APPOINTMENT DATE: 08/2022   MEDICATION:Pregabalin 50 mg   Is the patient out of medication?   PHARMACY[facility]   Let patient know to contact pharmacy at the end of the day to make sure medication is ready.  Please notify patient to allow 48-72 hours to process

## 2022-08-05 NOTE — Progress Notes (Signed)
Cardiology Office Note:    Date:  08/07/2022   ID:  Joshua Riley, DOB 12/12/1935, MRN [id number]  PCP:  Marin Olp, MD   [facility] Providers Cardiologist:  None {    Referring MD: Marin Olp, MD    History of Present Illness:    Joshua Riley is a 87 y.o. male with a hx of DVT, ulcerative colitis, ESRD on HD, hypotension on midodrine, and persistent Afib on apixaban who presents to clinic for follow-up of Afib.  Patient was seen by Dr. Yong Channel on 04/2022. Has known history of persistent Afib for which he has been maintained on eliquis. Not on nodal agents due to hypotension with HD requiring midodrine. Was referred to cardiology for further management.  Per review of prior Cardiology records, has history of stress test in 11/2020 which was negative for ischemia. TTE with normal LVEF with "mitral sclerosis."  Was initially seen in clinic in 05/2022 where he was having worsening SOB with minimal exertion. We scheduled a cath but then was admitted from 11/27-12/2/23  prior to the procedure for fever up to 103 after HD with confusion. WBC 7.8K, influenza, COVID-19 PCR negative.  Blood culture positive for staph. Seen by ID who recommended to remove right groin TDC which was removed by vascular surgery after his left groin AV graft was functional.  Repeat blood cultures negative. He requested to postpone catheterization until early January.   TTE 05/2022 with EF 60-65%, normal strain, normal RV, trivial MR, ascending aorta 18m.  Ultimately underwent LHC on 07/22/22 which showed no significant CAD and normal LVEDP.   Today, the patient overall feels okay. Breathing is significantly improved. No chest pain, LE edema, orthopnea or PND. He is walking more and is able to go on the treadmill for 166m at 3.6m15mand feels well with exertion. Continues to drop BP with HD and remains on midodrine.   He denies any palpitations, chest pain, or peripheral edema. No  headaches, syncope, orthopnea, or PND.    Past Medical History:  Diagnosis Date   [place]-fib ([place]  Anemia    Arthritis    Cancer ([place]CC[place]  Basal cell   COVID-19    2021   Dyspnea    Dysrhythmia    Afib-controlled on eliquis   ESRD (end stage renal disease) ([place]CC[place]4/18/2023   Glaucoma 11/18/2021   History of DVT (deep vein thrombosis)    Hydronephrosis    managed wtih a PCN   Idiopathic neuropathy 10/22/2021   lyrica    [facility] in [facility]  Obstructive uropathy    With chronic left nephrostomy   Old retinal detachment, total or subtotal    Orthostatic hypotension 10/22/2021   Sleep apnea    does not need a machine   [place] ([place]CC[place]  Ulcerative colitis ([place]CC[place]  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    Past Surgical History:  Procedure Laterality Date   BASAL CELL CARCINOMA EXCISION     10/23   COLON SURGERY     creation of j pouch     and subsequent takedown of j pouch   EYE SURGERY     IR NEPHROSTOMY EXCHANGE LEFT  12/10/2021   IR NEPHROSTOMY EXCHANGE LEFT  04/22/2022   IR NEPHROSTOMY EXCHANGE LEFT  07/29/2022   LEFT HEART CATH AND CORONARY ANGIOGRAPHY N/A 07/22/2022   Procedure: LEFT HEART CATH AND CORONARY ANGIOGRAPHY;  Surgeon: JorMartiniqueeter M,  MD;  Location: [place] CV LAB;  Service: Cardiovascular;  Laterality: N/A;   REVISION OF ARTERIOVENOUS GORETEX GRAFT Left 05/06/2022   Procedure: REDO LEFT THIGH ARTERIOVENOUS 4-7 MM GORETEX GRAFT;  Surgeon: Angelia Mould, MD;  Location: [facility] OR;  Service: Vascular;  Laterality: Left;   SMALL INTESTINE SURGERY     TOTAL COLECTOMY      Current Medications: Current Meds  Medication Sig   cyanocobalamin (VITAMIN B12) 1000 MCG tablet Take 1,000 mcg by mouth daily.   ELIQUIS 2.5 MG TABS tablet Take 1 tablet (2.5 mg total) by mouth 2 (two) times daily.   latanoprost (XALATAN) 0.005 % ophthalmic solution Place 1 drop into the right eye at bedtime.   midodrine (PROAMATINE) 10 MG tablet Take 1  tablet (10 mg total) by mouth 3 (three) times daily. Takes 5 mg plus '10mg'$  for 15 mg total three times a day- started by nephrology in [place]   nystatin (MYCOSTATIN/NYSTOP) powder Apply 1 Application topically 3 (three) times daily.   Omega 3 1000 MG CAPS Take 1,000 mg by mouth daily.   pregabalin (LYRICA) 50 MG capsule Take 1 capsule (50 mg total) by mouth daily.   sevelamer carbonate (RENVELA) 800 MG tablet Take 2,400 mg by mouth 3 (three) times daily with meals.   sodium zirconium cyclosilicate (LOKELMA) 10 g PACK packet Take 10 g by mouth 4 (four) times a week. Non Dialysis days- Tues, Thurs, Saturday, Sunday   timolol (TIMOPTIC) 0.5 % ophthalmic solution Place 1 drop into both eyes daily.     Allergies:   Cephalosporins, Ciprofloxacin, and Baclofen   Social History   Socioeconomic History   Marital status: Widowed    Spouse name: Not on file   Number of children: Not on file   Years of education: Not on file   Highest education level: Not on file  Occupational History   Not on file  Tobacco Use   Smoking status: Former    Packs/day: 2.00    Years: 6.00    Total pack years: 12.00    Types: Cigarettes    Quit date: 5    Years since quitting: 39.1    Passive exposure: Never   Smokeless tobacco: Never  Vaping Use   Vaping Use: Never used  Substance and Sexual Activity   Alcohol use: Yes    Alcohol/week: 5.0 standard drinks of alcohol    Types: 5 Shots of liquor per week    Comment: socially   Drug use: Never   Sexual activity: Not Currently  Other Topics Concern   Not on file  Social History Narrative   Widowed- lost wife of 13 years to pancreatic cancer- previously married 77 years. Son in [place] and daughter helping in [place]. 4 grandkids.    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       Hobbies: dinner with family- occasional martini   Social Determinants of Health   Financial Resource Strain: Low Risk   (05/27/2022)   Overall Financial Resource Strain (CARDIA)    Difficulty of Paying Living Expenses: Not hard at all  Food Insecurity: No Food Insecurity (05/27/2022)   Hunger Vital Sign    Worried About Running Out of Food in the Last Year: Never true    Ran Out of Food in the Last Year: Never true  Transportation Needs: No Transportation Needs (05/27/2022)   PRAPARE - Transportation    Lack  of Transportation (Medical): No    Lack of Transportation (Non-Medical): No  Physical Activity: Inactive (05/27/2022)   Exercise Vital Sign    Days of Exercise per Week: 0 days    Minutes of Exercise per Session: 0 min  Stress: No Stress Concern Present (05/27/2022)   [place]    Feeling of Stress : Not at all  Social Connections: Moderately Integrated (05/27/2022)   Social Connection and Isolation Panel [NHANES]    Frequency of Communication with Friends and Family: More than three times a week    Frequency of Social Gatherings with Friends and Family: More than three times a week    Attends Religious Services: More than 4 times per year    Active Member of [employer] or Organizations: Yes    Attends Archivist Meetings: 1 to 4 times per year    Marital Status: Widowed     Family History: The patient's family history includes Cancer in his father; Esophageal cancer in his brother; Stroke in his mother.  ROS:   Review of Systems  Constitutional:  Negative for chills and fever.  HENT:  Negative for hearing loss and tinnitus.   Eyes:  Negative for blurred vision and double vision.  Respiratory:  Positive for shortness of breath ((exertional)). Negative for cough and hemoptysis.   Cardiovascular:  Positive for leg swelling ((bilateral ankles)). Negative for chest pain, palpitations, orthopnea, claudication and PND.  Gastrointestinal:  Negative for abdominal pain and diarrhea.  Genitourinary:  Negative for dysuria and  urgency.  Musculoskeletal:  Positive for myalgias ((cramping in hands and feet after dialysis)). Negative for neck pain.  Skin:  Negative for itching and rash.  Neurological:  Positive for dizziness. Negative for headaches.  Endo/Heme/Allergies:  Negative for polydipsia. Does not bruise/bleed easily.  Psychiatric/Behavioral:  Negative for hallucinations and suicidal ideas.      EKGs/Labs/Other Studies Reviewed:    The following studies were reviewed today: Cardiac Monitor 07/2022:    Patch wear time was 6 days and 21 hours   Predominant rhythm is NSR with average HR 75bpm   There were 2759 runs of SVT with longest lasting 14 beats   Mobitz type I ABV was present   Occasional SVE (4.1%), rare VE (<1%)   No Afib or significant pauses   No patient triggered events LHC 07/22/22:   The left ventricular systolic function is normal.   LV end diastolic pressure is normal.   The left ventricular ejection fraction is 55-65% by visual estimate.   No significant CAD Normal LV function Normal LVEDP  TTE 05/2022: IMPRESSIONS     1. Left ventricular ejection fraction, by estimation, is 60 to 65%. The  left ventricle has normal function. The left ventricle has no regional  wall motion abnormalities. There is mild left ventricular hypertrophy.  Left ventricular diastolic parameters  are consistent with Grade I diastolic dysfunction (impaired relaxation).  Elevated left atrial pressure. The average left ventricular global  longitudinal strain is -19.4 %. The global longitudinal strain is normal.   2. Right ventricular systolic function is normal. The right ventricular  size is normal. There is normal pulmonary artery systolic pressure.   3. The mitral valve is normal in structure. Trivial mitral valve  regurgitation. No evidence of mitral stenosis.   4. The aortic valve is normal in structure. Aortic valve regurgitation is  mild. No aortic stenosis is present.   5. Aortic dilatation noted.  There is borderline  dilatation of the aortic  root, measuring 38 mm. There is mild dilatation of the ascending aorta,  measuring 42 mm.   6. The inferior vena cava is normal in size with greater than 50%  respiratory variability, suggesting right atrial pressure of 3 mmHg.   Comparison(s): No prior Echocardiogram.  Dialysis access Doppler 12/24/2021: Summary:  No color flow or Doppler signal in the groin and proximal thigh, appears  occluded.   TTE 11/2020:  Findings        Left Ventricle    The left ventricle is normal in size and function with no evidence of    diastolic dysfunction.        Right Ventricle    The right ventricle is normal in size and function.        Left Atrium    The left atrium is normal in size.        Right Atrium    The right atrium is normal in size.        Aortic Valve    Structurally normal aortic valve.    There is trace aortic regurgitation.        Mitral Valve    Mitral sclerosis.    No evidence of mitral regurgitaton.        Tricuspid Valve    The tricuspid valve is structurally normal with normal antegrade flow and    no tricuspid regurgitation.        Pulmonic Valve    Structurally normal pulmonic valve.    Trace pulmonic regurgitation present.        Miscellaneous    The interatrial septum is intact without evidence of atrial septal defect    or patent foramen ovale.    The ventricular septum is intact.    No intracardiac masses, thrombi or vegetations noted.    The aortic root is normal.    Pulmonary arteries appear normal.        Pericardial Effusion    No evidence of pericardial effusion.        Pleural Effusion    There is no pleural effusion.        EKG:  EKG is personally reviewed.  05/22/2022: NSR, first degree AVB, PACs  Recent Labs: 06/02/2022: ALT 43 07/17/2022: BUN 37; Creatinine, Ser 8.65; Hemoglobin 11.6; Platelets 144; Potassium 4.9; Sodium 139  Recent Lipid Panel    Component Value Date/Time   CHOL 84  06/04/2022 0844   TRIG 125 06/04/2022 0844   HDL 36 (L) 06/04/2022 0844   CHOLHDL 2.3 06/04/2022 0844   VLDL 25 06/04/2022 0844   LDLCALC 23 06/04/2022 0844     Risk Assessment/Calculations:    CHA2DS2-VASc Score = 3  This indicates a 3.2% annual risk of stroke. The patient's score is based upon: CHF History: 0 HTN History: 0 Diabetes History: 0 Stroke History: 0 Vascular Disease History: 1 Age Score: 2 Gender Score: 0               Physical Exam:    VS:  BP (!) 92/54   Pulse 69   Ht '5\' 8"'$  (1.727 m)   Wt 170 lb 9.6 oz (77.4 kg)   SpO2 98%   BMI 25.94 kg/m     Wt Readings from Last 3 Encounters:  08/07/22 170 lb 9.6 oz (77.4 kg)  07/22/22 165 lb (74.8 kg)  07/03/22 172 lb 9.6 oz (78.3 kg)     GEN:  Well nourished, well developed in no acute distress  HEENT: Normal NECK: No JVD; No carotid bruits CARDIAC:  RR, no murmurs RESPIRATORY:  Clear to auscultation without rales, wheezing or rhonchi  ABDOMEN: Soft, non-tender, non-distended MUSCULOSKELETAL:  Warm, no edema  SKIN: Warm and dry NEUROLOGIC:  Alert and oriented x 3 PSYCHIATRIC:  Normal affect   ASSESSMENT:    1. DOE (dyspnea on exertion)   2. PAF (paroxysmal atrial fibrillation) ([place])   3. ESRD (end stage renal disease) ([place])   4. Chronic anticoagulation   5. Orthostatic hypotension   6. Hemodialysis-associated hypotension    PLAN:    In order of problems listed above:  #Dyspnea on Exertion: Reassuring CV work-up. [place] 07/2022 with no obstructive disease, normal LVEDP. TTE with normal LVEF 60-65%, no significant valve disease. Cardiac monitor with runs of SVT with longest lasting 14 beats, occasional SVE (4.1%). No Afib or pauses. SOB has significantly improved and he is now much more active.   #Persistent Afib: CHADs-vasc 3. Currently on apixaban for [facility]. Not on nodal agents due to hypotension. TTE 05/2022 with LVEF 60-65%, no significant valve disease.  Zio with runs of SVT but no Afib or  sustained arrhythmias -Continue apixaban 2.'5mg'$  BID -Not on nodal agents due to hypotension  #ESRD on HD: Followed by Nephrology. On M, W, F HD.  #Hypotension: Bps chronically in the 80s. Requires midodrine '15mg'$  TID even on non-HD days.      Medication Adjustments/Labs and Tests Ordered: Current medicines are reviewed at length with the patient today.  Concerns regarding medicines are outlined above.  No orders of the defined types were placed in this encounter.  No orders of the defined types were placed in this encounter.   There are no Patient Instructions on file for this visit.     Signed, Freada Bergeron, MD  08/07/2022 9:56 AM    [place]

## 2022-08-07 ENCOUNTER — Ambulatory Visit: Payer: Medicare Other | Attending: Cardiology | Admitting: Cardiology

## 2022-08-07 ENCOUNTER — Encounter: Payer: Self-pay | Admitting: Cardiology

## 2022-08-07 VITALS — BP 92/54 | HR 69 | Ht 68.0 in | Wt 170.6 lb

## 2022-08-07 DIAGNOSIS — Z7901 Long term (current) use of anticoagulants: Secondary | ICD-10-CM | POA: Diagnosis present

## 2022-08-07 DIAGNOSIS — I951 Orthostatic hypotension: Secondary | ICD-10-CM | POA: Diagnosis present

## 2022-08-07 DIAGNOSIS — R0609 Other forms of dyspnea: Secondary | ICD-10-CM | POA: Insufficient documentation

## 2022-08-07 DIAGNOSIS — N186 End stage renal disease: Secondary | ICD-10-CM | POA: Diagnosis present

## 2022-08-07 DIAGNOSIS — I953 Hypotension of hemodialysis: Secondary | ICD-10-CM | POA: Diagnosis present

## 2022-08-07 DIAGNOSIS — I480 Paroxysmal atrial fibrillation: Secondary | ICD-10-CM | POA: Insufficient documentation

## 2022-08-07 MED ORDER — ELIQUIS 2.5 MG PO TABS: 2.5000 mg | ORAL_TABLET | Freq: Two times a day (BID) | ORAL | 11 refills | Status: DC

## 2022-08-07 MED ORDER — MIDODRINE HCL 10 MG PO TABS: 10.0000 mg | ORAL_TABLET | Freq: Three times a day (TID) | ORAL | 3 refills | Status: DC

## 2022-08-07 NOTE — Patient Instructions (Signed)
Medication Instructions:   Your physician recommends that you continue on your current medications as directed. Please refer to the Current Medication list given to you today.  *If you need a refill on your cardiac medications before your next appointment, please call your pharmacy*    Follow-Up: At [facility], you and your health needs are our priority.  As part of our continuing mission to provide you with exceptional heart care, we have created designated Provider Care Teams.  These Care Teams include your primary Cardiologist (physician) and Advanced Practice Providers (APPs -  Physician Assistants and Nurse Practitioners) who all work together to provide you with the care you need, when you need it.  We recommend signing up for the patient portal called "MyChart".  Sign up information is provided on this After Visit Summary.  MyChart is used to connect with patients for Virtual Visits (Telemedicine).  Patients are able to view lab/test results, encounter notes, upcoming appointments, etc.  Non-urgent messages can be sent to your provider as well.   To learn more about what you can do with MyChart, go to [email or web address].    Your next appointment:   6 month(s)  Provider:   Dr. Pemberton   

## 2022-08-14 ENCOUNTER — Ambulatory Visit (INDEPENDENT_AMBULATORY_CARE_PROVIDER_SITE_OTHER): Payer: Medicare Other | Admitting: Family Medicine

## 2022-08-14 ENCOUNTER — Encounter: Payer: Self-pay | Admitting: Family Medicine

## 2022-08-14 VITALS — BP 107/55 | HR 62 | Temp 96.7°F | Ht 68.0 in | Wt 168.2 lb

## 2022-08-14 DIAGNOSIS — N186 End stage renal disease: Secondary | ICD-10-CM

## 2022-08-14 DIAGNOSIS — I951 Orthostatic hypotension: Secondary | ICD-10-CM | POA: Diagnosis not present

## 2022-08-14 DIAGNOSIS — Z992 Dependence on renal dialysis: Secondary | ICD-10-CM

## 2022-08-14 DIAGNOSIS — Z933 Colostomy status: Secondary | ICD-10-CM

## 2022-08-14 DIAGNOSIS — K51919 Ulcerative colitis, unspecified with unspecified complications: Secondary | ICD-10-CM | POA: Diagnosis not present

## 2022-08-14 DIAGNOSIS — G609 Hereditary and idiopathic neuropathy, unspecified: Secondary | ICD-10-CM | POA: Diagnosis not present

## 2022-08-14 NOTE — Patient Instructions (Addendum)
Glad you are doing well! Hope you get a good report/workup at dermatology  Recommended follow up: Return in about 6 months (around 02/12/2023) for followup or sooner if needed.Schedule b4 you leave.

## 2022-08-14 NOTE — Progress Notes (Signed)
Phone [phone] In person visit   Subjective:   Joshua Riley is a 87 y.o. year old very pleasant male patient who presents for/with See problem oriented charting Chief Complaint  Patient presents with   Follow-up   Past Medical History-  Patient Active Problem List   Diagnosis Date Noted   Ulcerative colitis ([place]) 06/02/2022    Priority: High   ESRD (end stage renal disease) on dialysis ([place]) 05/06/2022    Priority: High   Atrial fibrillation, persistent ([place]) 11/18/2021    Priority: High   Orthostatic hypotension 10/22/2021    Priority: High   Colostomy status ([place]) 10/22/2021    Priority: High   Attention to urostomy ([place]) 10/22/2021    Priority: High   Glaucoma 11/18/2021    Priority: Medium    Idiopathic neuropathy 10/22/2021    Priority: Medium    SBO (small bowel obstruction) ([place]) 11/18/2021    Priority: Low   DOE (dyspnea on exertion) 07/22/2022   Coag negative Staphylococcus bacteremia 06/06/2022    Medications- reviewed and updated Current Outpatient Medications  Medication Sig Dispense Refill   cyanocobalamin (VITAMIN B12) 1000 MCG tablet Take 1,000 mcg by mouth daily.     ELIQUIS 2.5 MG TABS tablet Take 1 tablet (2.5 mg total) by mouth 2 (two) times daily. 60 tablet 11   latanoprost (XALATAN) 0.005 % ophthalmic solution Place 1 drop into the right eye at bedtime.     midodrine (PROAMATINE) 10 MG tablet Take 1 tablet (10 mg total) by mouth 3 (three) times daily. Takes 5 mg plus '10mg'$  for 15 mg total three times a day- started by nephrology in [place] 270 tablet 3   nystatin (MYCOSTATIN/NYSTOP) powder Apply 1 Application topically 3 (three) times daily. 60 g 4   Omega 3 1000 MG CAPS Take 1,000 mg by mouth daily.     pregabalin (LYRICA) 50 MG capsule Take 1 capsule (50 mg total) by mouth daily. 90 capsule 3   sevelamer carbonate (RENVELA) 800 MG tablet Take 2,400 mg by mouth 3 (three) times daily with meals.     sodium zirconium cyclosilicate (LOKELMA) 10  g PACK packet Take 10 g by mouth 4 (four) times a week. Non Dialysis days- Tues, Thurs, Saturday, Sunday     timolol (TIMOPTIC) 0.5 % ophthalmic solution Place 1 drop into both eyes daily.     No current facility-administered medications for this visit.     Objective:  BP (!) 107/55 (BP Location: Left Arm, Patient Position: Sitting)   Pulse 62   Temp (!) 96.7 F (35.9 C) (Temporal)   Ht '5\' 8"'$  (1.727 m)   Wt 168 lb 3.2 oz (76.3 kg)   SpO2 99%   BMI 25.57 kg/m  Gen: NAD, resting comfortably CV: RRR no murmurs rubs or gallops Lungs: CTAB no crackles, wheeze, rhonchi Ext: trace edema Skin: warm, dry     Assessment and Plan   #History of blood at rectum- notes light amount of blood at rectum every few months- diaper cream works within a few days or a wekk to resolve it. Not having active issues. No worsening anemia with dialysis. Does have baseline low hgb but on last check 4 weeks ago improved to 11.6 from 8s level. MCV is high at baseline. Has colonstomy with no blood noted and history of complete colectomy. Could be local irritation- we opted to monitor.   # Atrial fibrillation S: Rate controlled without medicine Anticoagulated with eliquis 2.5 mg BID recommended by cardiology A/P: atrial fibrillation  rate controlled without medicine. Appropriately anticoagulated with eliquis- though not as much data about this in patients on dialysis   #ESRD #Hypotension S: Patient is on dialysis MWF  Medication: Renvela 800 mg-takes 2 tablets 3 times a day  -Reports starting dialysis around 2021.  Not on transplant list. -Does have a tendency towards low blood pressures with dialysis days-takes midodrine 15 mg 3 times a day prescribed by primary care -Left AV graft created 09/25/2021 with [facility] in [place] after femoral dialysis catheter stopped working December 2022 A/P: ESRD- remains on dialysis MWF and blood pressure doing ok on midodrine  through cardiology- no substantial lightheadedness other than with dialysis   # Idiopathic Neuropathy- started after covid though S: Medication: Lyrica 50 mg started in mid 2022. Mildly helpful- still has issues with some burning in the feet A/P: mild worsening- still opted to continue current medicine   # history of ulcerative colitis led to complete colectomy in 2012- has colostomy bag (failed J patch) -also has urostomy as complication of colectomy which led to quicker dialysis per his report (sounds like had CKD IV prior to this). Still urinates from penis as well -ulcerative colitis status noted but no isuses since colectomy obviously  Recommended follow up: Return in about 6 months (around 02/12/2023) for followup or sooner if needed.Schedule b4 you leave. Future Appointments  Date Time Provider [place]  10/21/2022  2:30 PM [facility] 1 [facility] [place]  02/12/2023  9:40 AM Freada Bergeron, MD [facility]  06/05/2023  9:00 AM [facility]    Lab/Order associations:   ICD-10-CM   1. ESRD (end stage renal disease) on dialysis ([place])  N18.6    Z99.2     2. Orthostatic hypotension  I95.1     3. Ulcerative colitis with complication, unspecified location ([place])  K51.919     4. Idiopathic neuropathy  G60.9     5. Colostomy status ([place]) Chronic Z93.3       No orders of the defined types were placed in this encounter.   Return precautions advised.  Garret Reddish, MD

## 2022-08-21 ENCOUNTER — Encounter: Payer: Self-pay | Admitting: Family Medicine

## 2022-08-21 ENCOUNTER — Ambulatory Visit (INDEPENDENT_AMBULATORY_CARE_PROVIDER_SITE_OTHER): Payer: Medicare Other | Admitting: Family Medicine

## 2022-08-21 ENCOUNTER — Ambulatory Visit: Payer: Medicare Other | Admitting: Family Medicine

## 2022-08-21 VITALS — BP 135/73 | HR 85 | Temp 97.1°F | Ht 68.0 in | Wt 167.0 lb

## 2022-08-21 DIAGNOSIS — I4819 Other persistent atrial fibrillation: Secondary | ICD-10-CM

## 2022-08-21 DIAGNOSIS — N186 End stage renal disease: Secondary | ICD-10-CM | POA: Diagnosis not present

## 2022-08-21 DIAGNOSIS — D692 Other nonthrombocytopenic purpura: Secondary | ICD-10-CM | POA: Diagnosis not present

## 2022-08-21 DIAGNOSIS — R059 Cough, unspecified: Secondary | ICD-10-CM | POA: Diagnosis not present

## 2022-08-21 DIAGNOSIS — Z992 Dependence on renal dialysis: Secondary | ICD-10-CM

## 2022-08-21 LAB — POC COVID19 BINAXNOW: SARS Coronavirus 2 Ag: NEGATIVE

## 2022-08-21 LAB — POCT INFLUENZA A/B
Influenza A, POC: NEGATIVE
Influenza B, POC: NEGATIVE

## 2022-08-21 MED ORDER — AMOXICILLIN 500 MG PO CAPS: 500.0000 mg | ORAL_CAPSULE | Freq: Two times a day (BID) | ORAL | 0 refills | Status: AC

## 2022-08-21 NOTE — Progress Notes (Signed)
   Joshua Riley is a 87 y.o. male who presents today for an office visit.  Assessment/Plan:  New/Acute Problems: Cough  Likely viral URI.  COVID and flu testing are both negative.  He does have quite a bit of underlying comorbidities including ESRD on intermittent hemodialysis and atrial fibrillation.  Also does have a history of sepsis secondary to staph bacteremia.  Overall appears well today.  Afebrile.  No signs of respiratory distress.  He will be going out of town soon to [place] for a wedding.  We will send in a pocket prescription for amoxicillin with instruction to not start unless symptoms worsen or fail to improve over the next several days.  We will renally dose this amoxicillin due to his ESRDHe can continue over-the-counter medications.  Encouraged hydration.  We discussed reasons to return to care and seek emergent care.  Follow-up as needed.  Chronic Problems Addressed Today: ESRD He has been compliant with dialysis.  Will be renally dosing his amoxicillin as above.  A-fib anticoagulated on Eliquis Patient with senile purpura.  Reassured patient.  He also had a small amount of rectal bleeding recently that has been stable.  He asked about using potential creams for irritation.  Recommended over-the-counter preparation H or hydrocortisone If needed for irritation. Discussed reasons to return to care.     Subjective:  HPI:  See A/P for status of chronic conditions.  Patient is here today with fever, chills, and cough.  This started yesterday. Daughter took his temperature yesterady and it read 101F. Cough started yesterday. Some shortness of breath. No chest pain. Tried tylenol which did not help. About the same since yesterday. No drainage or congestion.  He is going to [place] for a wedding in a couple of days.  He is also concerned about his blood thinner.  He recently saw his PCP and had a small amount of rectal bleeding at that time.  This has been stable since then.  He  has also noticed more bruising on his arms.       Objective:  Physical Exam: BP 135/73   Pulse 85   Temp (!) 97.1 F (36.2 C) (Temporal)   Ht '5\' 8"'$  (1.727 m)   Wt 167 lb (75.8 kg)   SpO2 98%   BMI 25.39 kg/m   Gen: No acute distress, resting comfortably HEENT: OP erythematous.  Nose mucosa erythematous. CV: Regular rate and rhythm with no murmurs appreciated Pulm: Normal work of breathing, clear to auscultation bilaterally with no crackles, wheezes, or rhonchi Skin: Scattered ecchymoses and purpura on upper extremities. Neuro: Grossly normal, moves all extremities Psych: Normal affect and thought content      Zian Delair M. Jerline Pain, MD 08/21/2022 10:46 AM

## 2022-08-21 NOTE — Patient Instructions (Signed)
It was very nice to see you today!  Your COVID and flu test were both negative.  You probably have a viral infection.  We will send an antibiotic but please do not start unless your symptoms worsen.  Please let us know if your symptoms or not improving.  Take care, Dr Jerline Pain  PLEASE NOTE:  If you had any lab tests, please let us know if you have not heard back within a few days. You may see your results on mychart before we have a chance to review them but we will give you a call once they are reviewed by [place].   If we ordered any referrals today, please let us know if you have not heard from their office within the next week.   If you had any urgent prescriptions sent in today, please check with the pharmacy within an hour of our visit to make sure the prescription was transmitted appropriately.   Please try these tips to maintain a healthy lifestyle:  Eat at least 3 REAL meals and 1-2 snacks per day.  Aim for no more than 5 hours between eating.  If you eat breakfast, please do so within one hour of getting up.   Each meal should contain half fruits/vegetables, one quarter protein, and one quarter carbs (no bigger than a computer mouse)  Cut down on sweet beverages. This includes juice, soda, and sweet tea.   Drink at least 1 glass of water with each meal and aim for at least 8 glasses per day  Exercise at least 150 minutes every week.

## 2022-09-26 ENCOUNTER — Telehealth: Payer: Self-pay | Admitting: Family Medicine

## 2022-09-26 NOTE — Telephone Encounter (Signed)
Patient advised Go to ED Now    Patient Name: Joshua Riley [facility] Gender: Male DOB: 02-07-1936 Age: 87 Y 3 M 15 D Return Phone Number: [phone] (Primary) Address: City/ State/ Zip: [place]  [address] Client [place] [facility] [place] Client Site [place] [facility] Contact Type Call Who Is Calling Patient / Member / Family / Caregiver Call Type Triage / Clinical Relationship To Patient Self Return Phone Number [phone] (Primary) Chief Complaint BLEEDING - Uncontrollable (not vaginal) Reason for Call Symptomatic / Request for [name] states his s/s bleeding from rectum so much so that he is wearing depends and it has been going on for 2-3 days. Translation No Nurse Assessment Nurse: [name], RN, Varney Biles Date/Time [name] Time): 09/26/2022 1:25:06 PM Confirm and document reason for call. If symptomatic, describe symptoms. ---Caller states he is having bleeding from his rectum. Started 3-4 days ago. Leaking blood and not just with bowel movements. No abd pain Does the patient have any new or worsening symptoms? ---Yes Will a triage be completed? ---Yes Related visit to physician within the last 2 weeks? ---No Does the PT have any chronic conditions? (i.e. diabetes, asthma, this includes High risk factors for pregnancy, etc.) ---Yes List chronic conditions. ---Hypotension, dialysis Is this a behavioral health or substance abuse call? ---No Guidelines Guideline Title Affirmed Question Affirmed Notes Nurse Date/Time [name] Time) Rectal Bleeding [1] MODERATE rectal bleeding (small blood clots, passing blood without stool, or toilet water turns red) AND [2] more than once a day [name] 09/26/2022 1:27:13 PM   Disp. Time [name] Time) Disposition Final User 09/26/2022 1:24:04 PM Send to Urgent [name] 09/26/2022 1:29:48 PM Go to ED Now Yes  [name], RN, Varney Biles Final Disposition 09/26/2022 1:29:48 PM Go to ED Now Yes [name], RN, Jeri Modena Disagree/Comply Comply Caller Understands Yes PreDisposition Call Doctor Care Advice Given Per Guideline GO TO ED NOW: * You need to be seen in the Emergency Department. * Go to the ED at ___________ [facility] now. Drive carefully. NOTE TO TRIAGER - DRIVING: * Another adult should drive. * Patient should not delay going to the emergency department. BRING MEDICINES: * Bring a list of your current medicines when you go to the Emergency Department (ER). * Bring the pill bottles too. This will help the doctor (or NP/PA) to make certain you are taking the right medicines and the right dose. CARE ADVICE given per Rectal Bleeding (Adult) guideline. Referrals [facility] - ED

## 2022-09-26 NOTE — Telephone Encounter (Signed)
Patient states he has been bleeding quite a bit of blood from his rectum for the last 2 or 3 days.   Transferred to Triage.

## 2022-09-26 NOTE — Telephone Encounter (Signed)
Most likely on eliquis and with his overall health will need to go to emergency room

## 2022-09-26 NOTE — Telephone Encounter (Signed)
Called and lm on pt vm with detailed message.

## 2022-09-27 ENCOUNTER — Emergency Department (HOSPITAL_BASED_OUTPATIENT_CLINIC_OR_DEPARTMENT_OTHER)
Admission: EM | Admit: 2022-09-27 | Discharge: 2022-09-27 | Disposition: A | Discharge status: Home / Self Care | Payer: Medicare Other | Attending: Emergency Medicine | Admitting: Emergency Medicine

## 2022-09-27 ENCOUNTER — Encounter (HOSPITAL_BASED_OUTPATIENT_CLINIC_OR_DEPARTMENT_OTHER): Payer: Self-pay | Admitting: Emergency Medicine

## 2022-09-27 ENCOUNTER — Other Ambulatory Visit: Payer: Self-pay

## 2022-09-27 DIAGNOSIS — D649 Anemia, unspecified: Secondary | ICD-10-CM

## 2022-09-27 DIAGNOSIS — Z85828 Personal history of other malignant neoplasm of skin: Secondary | ICD-10-CM | POA: Insufficient documentation

## 2022-09-27 DIAGNOSIS — Z8616 Personal history of COVID-19: Secondary | ICD-10-CM | POA: Diagnosis not present

## 2022-09-27 DIAGNOSIS — Z7901 Long term (current) use of anticoagulants: Secondary | ICD-10-CM | POA: Insufficient documentation

## 2022-09-27 DIAGNOSIS — K625 Hemorrhage of anus and rectum: Secondary | ICD-10-CM | POA: Insufficient documentation

## 2022-09-27 DIAGNOSIS — N186 End stage renal disease: Secondary | ICD-10-CM | POA: Diagnosis not present

## 2022-09-27 LAB — CBC WITH DIFFERENTIAL/PLATELET
Abs Immature Granulocytes: 0.05 10*3/uL (ref 0.00–0.07)
Basophils Absolute: 0 10*3/uL (ref 0.0–0.1)
Basophils Relative: 0 %
Eosinophils Absolute: 0.2 10*3/uL (ref 0.0–0.5)
Eosinophils Relative: 2 %
HCT: 28.6 % — ABNORMAL LOW (ref 39.0–52.0)
Hemoglobin: 9.7 g/dL — ABNORMAL LOW (ref 13.0–17.0)
Immature Granulocytes: 1 %
Lymphocytes Relative: 12 %
Lymphs Abs: 1.1 10*3/uL (ref 0.7–4.0)
MCH: 38.8 pg — ABNORMAL HIGH (ref 26.0–34.0)
MCHC: 33.9 g/dL (ref 30.0–36.0)
MCV: 114.4 fL — ABNORMAL HIGH (ref 80.0–100.0)
Monocytes Absolute: 1.3 10*3/uL — ABNORMAL HIGH (ref 0.1–1.0)
Monocytes Relative: 13 %
Neutro Abs: 6.9 10*3/uL (ref 1.7–7.7)
Neutrophils Relative %: 72 %
Platelets: 137 10*3/uL — ABNORMAL LOW (ref 150–400)
RBC: 2.5 MIL/uL — ABNORMAL LOW (ref 4.22–5.81)
RDW: 15.2 % (ref 11.5–15.5)
Smear Review: NORMAL
WBC: 9.6 10*3/uL (ref 4.0–10.5)
nRBC: 0 % (ref 0.0–0.2)

## 2022-09-27 LAB — OCCULT BLOOD X 1 CARD TO LAB, STOOL: Fecal Occult Bld: POSITIVE — AB

## 2022-09-27 MED ORDER — HYDROCORTISONE ACETATE 25 MG RE SUPP: 25.0000 mg | Freq: Every day | RECTAL | 0 refills | Status: AC

## 2022-09-27 NOTE — ED Triage Notes (Signed)
Pt is on eliquis 

## 2022-09-27 NOTE — Discharge Instructions (Signed)
You were seen in the emergency room today with rectal bleeding.  I am treating you for possible internal hemorrhoids but would like for you to follow with your gastroenterology doctor for repeat appointment and if symptoms continue they may need to think about endoscopy.   If you develop worsening/heavy bleeding, lightheadedness, shortness of breath you should return to the emergency department for reevaluation.

## 2022-09-27 NOTE — ED Triage Notes (Signed)
Pt states he is here to be evaluated for rectal bleeding that started 5 days ago. He does have a colostomy, since 2012. He has had this problem 2 x in the past few months, MD told him to use hemorrhoid cream the first time, resolved and then started again, told to use cortisone. Resolved. Now has returned, stated coritisone cream did not help this time. No other symptoms

## 2022-09-27 NOTE — ED Provider Notes (Signed)
Emergency Department Provider Note   I have reviewed the triage vital signs and the nursing notes.   HISTORY  Chief Complaint No chief complaint on file.   HPI Joshua Riley is a 87 y.o. male past medical history reviewed below including A-fib on Eliquis, ESRD, and UC s/p colectomy with ostomy presents to the emergency department for rectal bleeding.  He said his issues in the past related to pressure sores and hemorrhoids.  He states typically these would resolve with steroid cream.  He is tried at this time with no relief.  He said moderate bright red blood per rectum over the past 5 days.  No lightheadedness.  No shortness of breath or chest pain.  No abdominal pain.  He continues to have good ostomy output.    Past Medical History:  Diagnosis Date   A-fib ([place])    Anemia    Arthritis    Cancer ([place])    Basal cell   COVID-19    2021   Dyspnea    Dysrhythmia    Afib-controlled on eliquis   ESRD (end stage renal disease) ([place]) 10/22/2021   Glaucoma 11/18/2021   History of DVT (deep vein thrombosis)    Hydronephrosis    managed wtih a PCN   Idiopathic neuropathy 10/22/2021   lyrica    Ileostomy in place [facility])    Obstructive uropathy    With chronic left nephrostomy   Old retinal detachment, total or subtotal    Orthostatic hypotension 10/22/2021   Sleep apnea    does not need a machine   Stroke ([place])    Ulcerative colitis ([place])    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 Respiratory: Denies shortness of breath. Gastrointestinal: No abdominal pain.  No nausea, no vomiting. Positive rectal bleeding.  Genitourinary: Negative for dysuria. Musculoskeletal: Negative for back pain. Skin: Negative for rash. Neurological: Negative for headaches.  ____________________________________________   PHYSICAL EXAM:  VITAL SIGNS: ED Triage Vitals [09/27/22  1004]  Enc Vitals Group     BP (!) 113/53     Pulse Rate 74     Resp 20     Temp (!) 97.1 F (36.2 C)     Temp Source Axillary     SpO2 100 %   Constitutional: Alert and oriented. Well appearing and in no acute distress. Eyes: Conjunctivae are normal. Head: Atraumatic. Nose: No congestion/rhinnorhea. Mouth/Throat: Mucous membranes are moist. Neck: No stridor.   Cardiovascular: Normal rate, regular rhythm. Good peripheral circulation. Grossly normal heart sounds.   Respiratory: Normal respiratory effort.  No retractions. Lungs CTAB. Gastrointestinal: Soft and nontender. No distention.  Rectal exam performed with nurse chaperone.  No external hemorrhoids.  On digital rectal exam there is some scant, maroon blood on the exam finger.  No visible fissure or laceration.  Musculoskeletal: No gross deformities of extremities. Neurologic:  Normal speech and language.  Skin:  Skin is warm, dry and intact. No rash noted.  ____________________________________________   LABS (all labs ordered are listed, but only abnormal results are displayed)  Labs Reviewed  CBC WITH DIFFERENTIAL/PLATELET - Abnormal; Notable for the following components:      Result Value   RBC 2.50 (*)    Hemoglobin 9.7 (*)    HCT 28.6 (*)    MCV 114.4 (*)    MCH 38.8 (*)    Platelets 137 (*)    All other  components within normal limits  POC OCCULT BLOOD, ED   ____________________________________________   PROCEDURES  Procedure(s) performed:   Procedures  None  ____________________________________________   INITIAL IMPRESSION / ASSESSMENT AND PLAN / ED COURSE  Pertinent labs & imaging results that were available during my care of the patient were reviewed by me and considered in my medical decision making (see chart for details).   This patient is Presenting for Evaluation of rectal bleeding, which does require a range of treatment options, and is a complaint that involves a high risk of morbidity and  mortality.  The Differential Diagnoses include internal hemorrhoid, external hemorrhoid, rectal fissure, colitis, etc.  I did obtain Additional Historical Information from daughter at bedside.   Clinical Laboratory Tests Ordered, included with anemia at 9.7 down from 11.6 in mid January. No leukocytosis. Grossly positive hemoccult.   Medical Decision Making: Summary:  Patient presents emergency department with bleeding from his rectum.  He has history of colectomy.  I do not appreciate any heavy bleeding from the pouch.  No laceration or fissure.  No external hemorrhoid.  No abdominal pain or tenderness.  He is on Eliquis but is hemodynamically stable, awake, alert.  Plan to start suppository Anusol for possible internal hemorrhoid.  Will have him follow with his GI doctors in the coming week to consider endoscopic visualization if symptoms continue.   Reevaluation with update and discussion with patient. Discussed results and plan along with ED return precautions.   Considered admission patient is overall well-appearing.  Hemoglobin downtrending slightly from January but not requiring blood transfusion or emergent GI referral at this time.   Patient's presentation is most consistent with acute, uncomplicated illness.   Disposition: discharge  ____________________________________________  FINAL CLINICAL IMPRESSION(S) / ED DIAGNOSES  Final diagnoses:  Rectal bleeding  Anemia, unspecified type     NEW OUTPATIENT MEDICATIONS STARTED DURING THIS VISIT:  New Prescriptions   HYDROCORTISONE (ANUSOL-HC) 25 MG SUPPOSITORY    Place 1 suppository (25 mg total) rectally daily for 7 days.    Note:  This document was prepared using Dragon voice recognition software and may include unintentional dictation errors.  Nanda Quinton, MD, [facility] Emergency Medicine    Whitt Auletta, Wonda [facility], MD 09/27/22 1058

## 2022-10-06 ENCOUNTER — Ambulatory Visit: Payer: Medicare Other | Admitting: Family Medicine

## 2022-10-07 ENCOUNTER — Ambulatory Visit (INDEPENDENT_AMBULATORY_CARE_PROVIDER_SITE_OTHER): Payer: Medicare Other | Admitting: Internal Medicine

## 2022-10-07 ENCOUNTER — Encounter: Payer: Self-pay | Admitting: Internal Medicine

## 2022-10-07 VITALS — BP 124/70 | HR 63 | Temp 98.4°F | Ht 68.0 in | Wt 170.4 lb

## 2022-10-07 DIAGNOSIS — D500 Iron deficiency anemia secondary to blood loss (chronic): Secondary | ICD-10-CM

## 2022-10-07 DIAGNOSIS — K922 Gastrointestinal hemorrhage, unspecified: Secondary | ICD-10-CM | POA: Insufficient documentation

## 2022-10-07 DIAGNOSIS — K625 Hemorrhage of anus and rectum: Secondary | ICD-10-CM

## 2022-10-07 LAB — CBC
HCT: 31.3 % — ABNORMAL LOW (ref 39.0–52.0)
Hemoglobin: 10.4 g/dL — ABNORMAL LOW (ref 13.0–17.0)
MCHC: 33.3 g/dL (ref 30.0–36.0)
MCV: 117.6 fl — ABNORMAL HIGH (ref 78.0–100.0)
Platelets: 175 10*3/uL (ref 150.0–400.0)
RBC: 2.66 Mil/uL — ABNORMAL LOW (ref 4.22–5.81)
RDW: 16.5 % — ABNORMAL HIGH (ref 11.5–15.5)
WBC: 8.9 10*3/uL (ref 4.0–10.5)

## 2022-10-07 LAB — FERRITIN: Ferritin: 881.4 ng/mL — ABNORMAL HIGH (ref 22.0–322.0)

## 2022-10-07 MED ORDER — HYDROCORTISONE ACETATE 25 MG RE SUPP
25.0000 mg | Freq: Two times a day (BID) | RECTAL | 0 refills | Status: DC
Start: 2022-10-07 — End: 2022-11-11

## 2022-10-07 MED ORDER — FERROUS SULFATE 325 (65 FE) MG PO TBEC
325.0000 mg | DELAYED_RELEASE_TABLET | Freq: Two times a day (BID) | ORAL | 2 refills | Status: DC
Start: 2022-10-07 — End: 2023-02-10

## 2022-10-07 NOTE — Assessment & Plan Note (Addendum)
Since leaving the emergency room he has been using the hydrocortisone and has used all of it up and he thinks it helped some.  Based on his description it sounds like there is minimal bleeding still going on and so I do recommend repeating a blood count but even that is not very important because he does not have colon attached to his rectal pouch in the risk of serious bleeding going undetected is essentially 0 as a result.  I do think he should check in with an anorectal surgeon in case there is something that needs cauterized but I do not have the ability to perform a anoscopy in office to evaluate the bleeding source.  I did offer to repeat a rectal bleeding exam but I do not think it is necessary due to his report of ongoing pinkish fluid coming out.  Overall I think the best path forward is just trying another round of the hydrocortisone and see if it just stops bleeding and if it does not then go see the anorectal specialist.  I do not think bleeding from this spot needs to return to the emergency room unless it is a massive amount of blood.  Initially I did not I think it is worth stopping the blood thinner for this bleeding but we did discuss the risks and benefits and using shared decision making we decided to cut the Eliquis to just 1 tablet a day for 3 days and if it continues to bleed cut it to 0 tablets daily.  I considered maybe trying to use a tampon or something to try to tamponade the rectal bleeding but decided since it was not designed for rectum not to go that direction and just use more Anusol.  If all that fails then need to see a rectal rectal surgeon who can do a anoscopy and cautery or hemorrhoid banding  CHA2DS2-VASc Score = 3  The patient's score is based upon: CHF History: 0 HTN History: 0 Diabetes History: 0 Stroke History: 0 Vascular Disease History: 1 Age Score: 2 Gender Score: 0       ASSESSMENT AND PLAN: Permanent Atrial Fibrillation (ICD10:  I48.11) The patient's  CHA2DS2-VASc score is 3, indicating a 3.2% annual risk of stroke.  So if eliquis is held- there is approximately 3.2% chance of stroke so about 0.01% chance per day held.   So his plan is try to cutting eliquis once a day for 3 days to get the bleeding to stop  Patients chart review and interview were used to generate a prompt for artificial intelligence analysis ([employer] artificial intelligence) clinical decision support.  AI output was reviewed and not very helpful in this case but is provided in red:  Comprehensive Review of the Case: An 87 year old male with a complex medical history including atrial fibrillation on Eliquis, end-stage renal disease on dialysis, ulcerative colitis, history of colectomy with non-bleeding ostomy, and multiple other comorbidities, presents for follow-up after experiencing rectal bleeding and anemia. The patient reports more fluid mixed with blood, described as pinkish, since leaving the emergency room, with no blood from the ostomy itself. Laboratory results show a decreasing trend in hemoglobin levels from 11.6 g/dL in mid-January to 9.7 g/dL in late March, indicating ongoing blood loss. Emergency room evaluation did not reveal heavy bleeding, laceration, fissure, external hemorrhoids, abdominal pain, or tenderness. 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 The initial  treatment plan includes Anusol suppository for possible internal hemorrhoids, with a recommendation for endoscopic visualization by GI specialists if symptoms persist.  Clinical Problem Representation: An 87 year old male with a history of colectomy and multiple comorbidities, including atrial fibrillation on anticoagulation, presents with rectal bleeding and anemia characterized by a decrease in hemoglobin levels  over several months. The patient experiences pinkish fluid mixed with blood rectally, with no evidence of bleeding from the ostomy site. Initial evaluation did not reveal any obvious source of bleeding, and there is no abdominal pain or tenderness. The patient's complex medical history and current presentation suggest a need for further endoscopic evaluation to identify the source of gastrointestinal bleeding.   Most Likely Diagnoses: - Diverticular bleeding: Given the patient's age and history of gastrointestinal issues, diverticular bleeding could explain the rectal bleeding and anemia. This condition can present with painless bleeding and is common in older adults. The absence of visible bleeding from the ostomy site and the lack of abdominal pain or tenderness in the emergency room evaluation are consistent with this diagnosis. However, the patient's history of colectomy may affect the likelihood of this diagnosis depending on the extent of the surgery. - Angiodysplasia: Angiodysplasia, a vascular malformation in the colon, is another potential cause of the patient's symptoms. It is more common in older adults and can lead to chronic or intermittent bleeding, contributing to anemia. The patient's chronic kidney disease and history of anticoagulation use increase the risk for bleeding from angiodysplasia. This diagnosis would require endoscopic visualization for confirmation. - Ischemic colitis: Considering the patient's extensive cardiovascular history, including atrial fibrillation and end-stage renal disease, ischemic colitis could be a cause of the rectal bleeding and anemia. Ischemic colitis can present with minimal to moderate bleeding and is more likely in patients with vascular disease or those on anticoagulation therapy. The lack of abdominal pain in this case is not typical for ischemic colitis but does not rule it out.   Expanded Differential Diagnoses: - Radiation proctitis: If the patient has  a history of radiation therapy for pelvic cancers, radiation proctitis could be a consideration. This condition can lead to chronic rectal bleeding and anemia. However, there is no mention of radiation therapy in the patient's history. - Internal hemorrhoids: Although the initial treatment plan includes Anusol suppository for possible internal hemorrhoids, the patient's presentation and history suggest that hemorrhoids alone are unlikely to account for the degree of anemia observed. Internal hemorrhoids could contribute to the rectal bleeding but are less likely to cause significant anemia without more substantial bleeding. - Colonic neoplasm: Given the patient's age and gastrointestinal symptoms, a colonic neoplasm must be considered. Neoplasms can present with occult bleeding leading to anemia and may not always be accompanied by significant pain or changes in bowel habits. Endoscopic evaluation is crucial for ruling out this possibility.   Can't Miss Differential Diagnosis: - Colonic neoplasm: While not immediately life-threatening in the acute sense, missing a diagnosis of colonic neoplasm could have significant long-term implications for the patient's health and prognosis. Early detection and treatment are crucial. - Major gastrointestinal bleed: Although the current presentation suggests minimal blood loss, the potential for a major gastrointestinal bleed, especially in the context of anticoagulation therapy, cannot be overlooked. A sudden increase in bleeding could lead to hemodynamic instability. - Ischemic colitis leading to bowel necrosis: If the patient's rectal bleeding is due to ischemic colitis, there is a risk of progression to bowel necrosis, a life-threatening condition requiring immediate surgical intervention.  This case requires  careful consideration of the patient's complex medical history, current medications, and the need for further diagnostic evaluation, including endoscopic  visualization, to accurately diagnose and manage the cause of the rectal bleeding and anemia.  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 The patient reports more fluid mixed with blood, pinkish in color, since leaving the emergency room, with no blood from the ostomy. Laboratory results indicate a fluctuating but generally low hemoglobin level, with the most recent being 9.7 g/dL. The emergency room note indicates a plan to start Anusol suppository for possible internal hemorrhoid and follow-up with GI doctors for endoscopic visualization if symptoms persist.  Differential Diagnosis (DDx): 1. Internal Hemorrhoids: Given the plan to start Anusol suppository, this is considered but less likely due to the patient's complex surgical history and the nature of bleeding. 2. Angiodysplasia: Common in the elderly, may present with intermittent, low-volume bleeding. 3. Ischemic Colitis: Given the patient's history of vascular issues and ESRD, ischemia to the bowel could present with bleeding. 4. Diverticular Disease: Less likely due to history of colectomy, but possible if there is remaining colon. 5. Anastomotic Ulcer: Possible in patients with a history of bowel surgery. 6. Neoplasia: Always a consideration in a patient with new-onset GI bleeding, though no direct evidence from the provided history.  Plan:  Rectal Bleeding and Anemia: -  Assessment: The patient's rectal bleeding is minimal with no massive blood loss detected. The anemia is persistent and requires further evaluation to identify the source of bleeding and address the underlying cause. - Dx:   - Repeat CBC to monitor hemoglobin trends.   - Iron studies to assess for iron deficiency anemia.   - Colonoscopy, if not contraindicated, to evaluate for potential sources of bleeding such as angiodysplasia, anastomotic ulcers, or residual diverticular disease.   - Consider capsule endoscopy if colonoscopy is non-diagnostic, to evaluate for small bowel sources of bleeding. - Tx:   - Continue Anusol suppository as planned for symptomatic relief if internal hemorrhoids are confirmed.   - Iron supplementation if iron deficiency is identified.   - Specific treatment based on the identified source of bleeding (e.g., endoscopic intervention for angiodysplasia, surgical consultation for anastomotic ulcer).   - Monitor and adjust Eliquis dosing in consultation with cardiology, considering the bleeding risk and the patient's atrial fibrillation.  Comorbid Conditions: - ESRD on Dialysis: Continue current management, monitor for any impact of anemia on dialysis efficiency. - Atrial Fibrillation: Review anticoagulation in the context of GI bleeding. Consult cardiology for potential adjustments. - Orthostatic Hypotension: Monitor blood pressure closely, especially in the context of anemia and potential volume depletion from GI bleeding.  Monitoring and Follow-Up: - Close monitoring of hemodynamic status and hemoglobin levels. - Follow-up appointment after diagnostic procedures to discuss findings and adjust the management plan accordingly. - Coordination of care with gastroenterology, cardiology, and nephrology to manage this patient's complex medical issues comprehensively.  This plan aims to stabilize the patient, identify the source of bleeding, correct anemia, and manage the  patient's comorbid conditions in a multidisciplinary approach.  The Question:  is there a way to tamponade  the blood loss from the rectal pouch in 87 y.o. male Follow-up for rectal bleeding and anemia.- but with colectomy and nonbleeding ostomy More fluid mixed with blood pinkish since leaving emergency room Has ostomy but no blood from that Its rectal bleeding with minimal blood loss and no potential for massive blood loss going undetected Lab Results Component Value Date/Time HGB 9.7 (L) 09/27/2022 10:28 AM HGB 11.6 (L) 07/17/2022 10:34 AM HGB 8.2 (L) 06/06/2022 07:56 AM HGB 8.0 (L) 06/05/2022 05:01 AM HGB 8.2 (L) 06/04/2022 08:44 AM HGB 8.1 (L) 06/03/2022 05:00 AM Emergency room note 09/27/22 Clinical Laboratory Tests Ordered, included with anemia at 9.7 down from 11.6 in mid January. No leukocytosis. Grossly positive hemoccult. Medical Decision Making: Summary: Patient presents emergency department with bleeding from his rectum. He has history of colectomy. I do not appreciate any heavy bleeding from the pouch. No laceration or fissure. No external hemorrhoid. No abdominal pain or tenderness. He is on Eliquis but is hemodynamically stable, awake, alert. Plan to start suppository Anusol for possible internal hemorrhoid. Will have him follow with his GI doctors in the coming week to consider endoscopic visualization if symptoms continue. He has Orthostatic hypotension; Idiopathic neuropathy; Colostomy status; Attention to urostomy; SBO (small bowel obstruction); Glaucoma; Atrial fibrillation, persistent; ESRD (end stage renal disease) on dialysis; Ulcerative colitis; Coag negative Staphylococcus bacteremia; DOE (dyspnea on exertion); Senile purpura; and GI bleeding on their problem list. He has a past medical history of A-fib, Anemia, Arthritis, Cancer, COVID-19, Dyspnea, Dysrhythmia, ESRD (end stage renal disease) (10/22/2021), Glaucoma (11/18/2021), History of DVT (deep vein thrombosis), Hydronephrosis,  Idiopathic neuropathy (10/22/2021), Ileostomy in place, Obstructive uropathy, Old retinal detachment, total or subtotal, Orthostatic hypotension (10/22/2021), Sleep apnea, Stroke,.  Design Strategy:  To address this question, we leverage a comprehensive understanding of gastrointestinal bleeding, particularly in the context of a patient with a history of colectomy and an ostomy. The strategy involves analyzing the patient's clinical presentation, laboratory results, and medical history to identify the most appropriate and least invasive management options that align with best practices in the care of patients with gastrointestinal bleeding and complex medical histories.  Execute Strategy:  Upon analyzing the question, the patient's history of colectomy, presence of an ostomy, and current symptoms of rectal bleeding with minimal blood loss suggest a lower gastrointestinal source. The use of Eliquis (an anticoagulant) complicates the bleeding risk. The approach to tamponade or manage the bleeding must consider the patient's overall stability, the chronicity of the bleeding, and the potential sources within the rectal pouch or remaining gastrointestinal tract.  - The initial step involves ensuring hemodynamic stability and correcting any coagulopathy, particularly given the patient's use of Eliquis. This might involve temporarily withholding the anticoagulant and administering reversal agents if necessary, depending on the severity of the bleeding and the patient's overall risk of thrombosis. - For direct tamponade of bleeding from the rectal pouch, options are limited and largely depend on the source of the bleeding. If an internal hemorrhoid is suspected, as indicated by the plan to start Anusol suppository, conservative management including the suppository and possibly other topical hemostatic agents may be sufficient for minor bleeding. - In cases where bleeding persists or the source within the rectal  pouch is unclear, endoscopic evaluation is crucial. This could involve a flexible sigmoidoscopy or, if the anatomy permits, a colonoscopy to visualize the rectal pouch and apply direct endoscopic interventions such as clipping, banding, or injection of epinephrine to tamponade and stop the bleeding. - Given the patient's  complex medical history, including end-stage renal disease (ESRD) on dialysis, careful consideration of fluid management and electrolyte balance is necessary during any intervention.  Systematically Ensure Accuracy & Precision:  Re-evaluating the patient's presentation and the management options, ensuring hemodynamic stability and addressing the anticoagulation are immediate priorities. Subsequent direct visualization and potential endoscopic intervention offer the best approach to identifying and managing the source of bleeding within the rectal pouch. This plan aligns with the principles of managing gastrointestinal bleeding in patients with complex medical histories and the potential for coagulopathy.  Final Answer:  The most appropriate management strategy for tamponading the blood loss from the rectal pouch in this patient involves initial stabilization, review and management of anticoagulation, followed by endoscopic evaluation for direct visualization and potential intervention within the rectal pouch. This approach addresses the immediate bleeding risk while considering the patient's complex medical history and the need for a targeted intervention to manage the source of bleeding effectively.

## 2022-10-07 NOTE — Progress Notes (Signed)
[facility]: [id number]   Routine Medical Office Visit  Patient:  Joshua Riley      Age: 87 y.o.       Sex:  male  Date:   10/07/2022  PCP:    Marin Olp, MD   [place] Provider: Loralee Pacas, MD   Assessment and Plan:   Gastrointestinal hemorrhage associated with anorectal source Assessment & Plan: Since leaving the emergency room he has been using the hydrocortisone and has used all of it up and he thinks it helped some.  Based on his description it sounds like there is minimal bleeding still going on and so I do recommend repeating a blood count but even that is not very important because he does not have colon attached to his rectal pouch in the risk of serious bleeding going undetected is essentially 0 as a result.  I do think he should check in with an anorectal surgeon in case there is something that needs cauterized but I do not have the ability to perform a anoscopy in office to evaluate the bleeding source.  I did offer to repeat a rectal bleeding exam but I do not think it is necessary due to his report of ongoing pinkish fluid coming out.  Overall I think the best path forward is just trying another round of the hydrocortisone and see if it just stops bleeding and if it does not then go see the anorectal specialist.  I do not think bleeding from this spot needs to return to the emergency room unless it is a massive amount of blood.  Initially I did not I think it is worth stopping the blood thinner for this bleeding but we did discuss the risks and benefits and using shared decision making we decided to cut the Eliquis to just 1 tablet a day for 3 days and if it continues to bleed cut it to 0 tablets daily.  I considered maybe trying to use a tampon or something to try to tamponade the rectal bleeding but decided since it was not designed for rectum not to go that direction and just use more Anusol.  If all that fails then need to see a  rectal rectal surgeon who can do a anoscopy and cautery or hemorrhoid banding  CHA2DS2-VASc Score = 3  The patient's score is based upon: CHF History: 0 HTN History: 0 Diabetes History: 0 Stroke History: 0 Vascular Disease History: 1 Age Score: 2 Gender Score: 0       ASSESSMENT AND PLAN: Permanent Atrial Fibrillation (ICD10:  I48.11) The patient's CHA2DS2-VASc score is 3, indicating a 3.2% annual risk of stroke.  So if eliquis is held- there is approximately 3.2% chance of stroke so about 0.01% chance per day held.   So his plan is try to cutting eliquis once a day for 3 days to get the bleeding to stop  Patients chart review and interview were used to generate a prompt for artificial intelligence analysis ([employer] artificial intelligence) clinical decision support.  AI output was reviewed and not very helpful in this case but is provided in red:  Comprehensive Review of the Case: An 87 year old male with a complex medical history including atrial fibrillation on Eliquis, end-stage renal disease on dialysis, ulcerative colitis, history of colectomy with non-bleeding ostomy, and multiple other comorbidities, presents for follow-up after experiencing rectal bleeding and anemia. The patient reports more fluid mixed with blood, described as pinkish, since leaving the emergency room, with no  blood from the ostomy itself. Laboratory results show a decreasing trend in hemoglobin levels from 11.6 g/dL in mid-January to 9.7 g/dL in late March, indicating ongoing blood loss. Emergency room evaluation did not reveal heavy bleeding, laceration, fissure, external hemorrhoids, abdominal pain, or tenderness. 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 The initial treatment plan includes Anusol suppository for  possible internal hemorrhoids, with a recommendation for endoscopic visualization by GI specialists if symptoms persist.  Clinical Problem Representation: An 87 year old male with a history of colectomy and multiple comorbidities, including atrial fibrillation on anticoagulation, presents with rectal bleeding and anemia characterized by a decrease in hemoglobin levels over several months. The patient experiences pinkish fluid mixed with blood rectally, with no evidence of bleeding from the ostomy site. Initial evaluation did not reveal any obvious source of bleeding, and there is no abdominal pain or tenderness. The patient's complex medical history and current presentation suggest a need for further endoscopic evaluation to identify the source of gastrointestinal bleeding.   Most Likely Diagnoses: - Diverticular bleeding: Given the patient's age and history of gastrointestinal issues, diverticular bleeding could explain the rectal bleeding and anemia. This condition can present with painless bleeding and is common in older adults. The absence of visible bleeding from the ostomy site and the lack of abdominal pain or tenderness in the emergency room evaluation are consistent with this diagnosis. However, the patient's history of colectomy may affect the likelihood of this diagnosis depending on the extent of the surgery. - Angiodysplasia: Angiodysplasia, a vascular malformation in the colon, is another potential cause of the patient's symptoms. It is more common in older adults and can lead to chronic or intermittent bleeding, contributing to anemia. The patient's chronic kidney disease and history of anticoagulation use increase the risk for bleeding from angiodysplasia. This diagnosis would require endoscopic visualization for confirmation. - Ischemic colitis: Considering the patient's extensive cardiovascular history, including atrial fibrillation and end-stage renal disease, ischemic colitis could be a  cause of the rectal bleeding and anemia. Ischemic colitis can present with minimal to moderate bleeding and is more likely in patients with vascular disease or those on anticoagulation therapy. The lack of abdominal pain in this case is not typical for ischemic colitis but does not rule it out.   Expanded Differential Diagnoses: - Radiation proctitis: If the patient has a history of radiation therapy for pelvic cancers, radiation proctitis could be a consideration. This condition can lead to chronic rectal bleeding and anemia. However, there is no mention of radiation therapy in the patient's history. - Internal hemorrhoids: Although the initial treatment plan includes Anusol suppository for possible internal hemorrhoids, the patient's presentation and history suggest that hemorrhoids alone are unlikely to account for the degree of anemia observed. Internal hemorrhoids could contribute to the rectal bleeding but are less likely to cause significant anemia without more substantial bleeding. - Colonic neoplasm: Given the patient's age and gastrointestinal symptoms, a colonic neoplasm must be considered. Neoplasms can present with occult bleeding leading to anemia and may not always be accompanied by significant pain or changes in bowel habits. Endoscopic evaluation is crucial for ruling out this possibility.   Can't Miss Differential Diagnosis: - Colonic neoplasm: While not immediately life-threatening in the acute sense, missing a diagnosis of colonic neoplasm could have significant long-term implications for the patient's health and prognosis. Early detection and treatment are crucial. - Major gastrointestinal bleed:  Although the current presentation suggests minimal blood loss, the potential for a major gastrointestinal bleed, especially in the context of anticoagulation therapy, cannot be overlooked. A sudden increase in bleeding could lead to hemodynamic instability. - Ischemic colitis leading to bowel  necrosis: If the patient's rectal bleeding is due to ischemic colitis, there is a risk of progression to bowel necrosis, a life-threatening condition requiring immediate surgical intervention.  This case requires careful consideration of the patient's complex medical history, current medications, and the need for further diagnostic evaluation, including endoscopic visualization, to accurately diagnose and manage the cause of the rectal bleeding and anemia.  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 The patient reports more fluid mixed with blood, pinkish in color, since leaving the emergency room, with no blood from the ostomy. Laboratory results indicate a fluctuating but generally low hemoglobin level, with the most recent being 9.7 g/dL. The emergency room note indicates a plan to start Anusol suppository for possible internal hemorrhoid and follow-up with GI doctors for endoscopic visualization if symptoms persist.  Differential Diagnosis (DDx): 1. Internal Hemorrhoids: Given the plan to start Anusol suppository, this is considered but less likely due to the patient's complex surgical history and the nature of bleeding. 2. Angiodysplasia: Common in the elderly, may present with intermittent, low-volume bleeding. 3. Ischemic Colitis: Given the patient's history of vascular issues and ESRD, ischemia to the bowel could present with  bleeding. 4. Diverticular Disease: Less likely due to history of colectomy, but possible if there is remaining colon. 5. Anastomotic Ulcer: Possible in patients with a history of bowel surgery. 6. Neoplasia: Always a consideration in a patient with new-onset GI bleeding, though no direct evidence from the provided history.  Plan:  Rectal Bleeding and Anemia: - Assessment: The patient's rectal bleeding is minimal with no massive blood loss detected. The anemia is persistent and requires further evaluation to identify the source of bleeding and address the underlying cause. - Dx:   - Repeat CBC to monitor hemoglobin trends.   - Iron studies to assess for iron deficiency anemia.   - Colonoscopy, if not contraindicated, to evaluate for potential sources of bleeding such as angiodysplasia, anastomotic ulcers, or residual diverticular disease.   - Consider capsule endoscopy if colonoscopy is non-diagnostic, to evaluate for small bowel sources of bleeding. - Tx:   - Continue Anusol suppository as planned for symptomatic relief if internal hemorrhoids are confirmed.   - Iron supplementation if iron deficiency is identified.   - Specific treatment based on the identified source of bleeding (e.g., endoscopic intervention for angiodysplasia, surgical consultation for anastomotic ulcer).   - Monitor and adjust Eliquis dosing in consultation with cardiology, considering the bleeding risk and the patient's atrial fibrillation.  Comorbid Conditions: - ESRD on Dialysis: Continue current management, monitor for any impact of anemia on dialysis efficiency. - Atrial Fibrillation: Review anticoagulation in the context of GI bleeding. Consult cardiology for potential adjustments. - Orthostatic Hypotension: Monitor blood pressure closely, especially in the context of anemia and potential volume depletion from GI bleeding.  Monitoring and Follow-Up: - Close monitoring of hemodynamic  status and hemoglobin  levels. - Follow-up appointment after diagnostic procedures to discuss findings and adjust the management plan accordingly. - Coordination of care with gastroenterology, cardiology, and nephrology to manage this patient's complex medical issues comprehensively.  This plan aims to stabilize the patient, identify the source of bleeding, correct anemia, and manage the patient's comorbid conditions in a multidisciplinary approach.  The Question:  is there a way to tamponade the blood loss from the rectal pouch in 87 y.o. male Follow-up for rectal bleeding and anemia.- but with colectomy and nonbleeding ostomy More fluid mixed with blood pinkish since leaving emergency room Has ostomy but no blood from that Its rectal bleeding with minimal blood loss and no potential for massive blood loss going undetected Lab Results Component Value Date/Time HGB 9.7 (L) 09/27/2022 10:28 AM HGB 11.6 (L) 07/17/2022 10:34 AM HGB 8.2 (L) 06/06/2022 07:56 AM HGB 8.0 (L) 06/05/2022 05:01 AM HGB 8.2 (L) 06/04/2022 08:44 AM HGB 8.1 (L) 06/03/2022 05:00 AM Emergency room note 09/27/22 Clinical Laboratory Tests Ordered, included with anemia at 9.7 down from 11.6 in mid January. No leukocytosis. Grossly positive hemoccult. Medical Decision Making: Summary: Patient presents emergency department with bleeding from his rectum. He has history of colectomy. I do not appreciate any heavy bleeding from the pouch. No laceration or fissure. No external hemorrhoid. No abdominal pain or tenderness. He is on Eliquis but is hemodynamically stable, awake, alert. Plan to start suppository Anusol for possible internal hemorrhoid. Will have him follow with his GI doctors in the coming week to consider endoscopic visualization if symptoms continue. He has Orthostatic hypotension; Idiopathic neuropathy; Colostomy status; Attention to urostomy; SBO (small bowel obstruction); Glaucoma; Atrial fibrillation, persistent; ESRD (end stage renal disease) on  dialysis; Ulcerative colitis; Coag negative Staphylococcus bacteremia; DOE (dyspnea on exertion); Senile purpura; and GI bleeding on their problem list. He has a past medical history of A-fib, Anemia, Arthritis, Cancer, COVID-19, Dyspnea, Dysrhythmia, ESRD (end stage renal disease) (10/22/2021), Glaucoma (11/18/2021), History of DVT (deep vein thrombosis), Hydronephrosis, Idiopathic neuropathy (10/22/2021), Ileostomy in place, Obstructive uropathy, Old retinal detachment, total or subtotal, Orthostatic hypotension (10/22/2021), Sleep apnea, Stroke,.  Design Strategy:  To address this question, we leverage a comprehensive understanding of gastrointestinal bleeding, particularly in the context of a patient with a history of colectomy and an ostomy. The strategy involves analyzing the patient's clinical presentation, laboratory results, and medical history to identify the most appropriate and least invasive management options that align with best practices in the care of patients with gastrointestinal bleeding and complex medical histories.  Execute Strategy:  Upon analyzing the question, the patient's history of colectomy, presence of an ostomy, and current symptoms of rectal bleeding with minimal blood loss suggest a lower gastrointestinal source. The use of Eliquis (an anticoagulant) complicates the bleeding risk. The approach to tamponade or manage the bleeding must consider the patient's overall stability, the chronicity of the bleeding, and the potential sources within the rectal pouch or remaining gastrointestinal tract.  - The initial step involves ensuring hemodynamic stability and correcting any coagulopathy, particularly given the patient's use of Eliquis. This might involve temporarily withholding the anticoagulant and administering reversal agents if necessary, depending on the severity of the bleeding and the patient's overall risk of thrombosis. - For direct tamponade of bleeding from the  rectal pouch, options are limited and largely depend on the source of the bleeding. If an internal hemorrhoid is suspected, as indicated by the plan to start Anusol suppository, conservative management including the suppository  and possibly other topical hemostatic agents may be sufficient for minor bleeding. - In cases where bleeding persists or the source within the rectal pouch is unclear, endoscopic evaluation is crucial. This could involve a flexible sigmoidoscopy or, if the anatomy permits, a colonoscopy to visualize the rectal pouch and apply direct endoscopic interventions such as clipping, banding, or injection of epinephrine to tamponade and stop the bleeding. - Given the patient's complex medical history, including end-stage renal disease (ESRD) on dialysis, careful consideration of fluid management and electrolyte balance is necessary during any intervention.  Systematically Ensure Accuracy & Precision:  Re-evaluating the patient's presentation and the management options, ensuring hemodynamic stability and addressing the anticoagulation are immediate priorities. Subsequent direct visualization and potential endoscopic intervention offer the best approach to identifying and managing the source of bleeding within the rectal pouch. This plan aligns with the principles of managing gastrointestinal bleeding in patients with complex medical histories and the potential for coagulopathy.  Final Answer:  The most appropriate management strategy for tamponading the blood loss from the rectal pouch in this patient involves initial stabilization, review and management of anticoagulation, followed by endoscopic evaluation for direct visualization and potential intervention within the rectal pouch. This approach addresses the immediate bleeding risk while considering the patient's complex medical history and the need for a targeted intervention to manage the source of bleeding effectively.  Orders: -      CBC -     Ferritin -     Hydrocortisone Acetate; Place 1 suppository (25 mg total) rectally 2 (two) times daily.  Dispense: 12 suppository; Refill: 0  Iron deficiency anemia due to chronic blood loss -     Ferrous Sulfate; Take 1 tablet (325 mg total) by mouth 2 (two) times daily before a meal.  Dispense: 60 tablet; Refill: 2       Clinical Presentation:   87 y.o. male here today for Follow-up (Went to ED on 3/23 for rectal bleeding and anemia.)  Reviewed:  has a past medical history of A-fib, Anemia, Arthritis, Cancer, COVID-19, Dyspnea, Dysrhythmia, ESRD (end stage renal disease) (10/22/2021), Glaucoma (11/18/2021), History of DVT (deep vein thrombosis), Hydronephrosis, Idiopathic neuropathy (10/22/2021), Ileostomy in place, Obstructive uropathy, Old retinal detachment, total or subtotal, Orthostatic hypotension (10/22/2021), Sleep apnea, Stroke, Ulcerative colitis, and Ureteral stricture. Active Ambulatory Problems    Diagnosis Date Noted   Orthostatic hypotension 10/22/2021   Idiopathic neuropathy 10/22/2021   Colostomy status 10/22/2021   Attention to urostomy 10/22/2021   SBO (small bowel obstruction) 11/18/2021   Glaucoma 11/18/2021   Atrial fibrillation, persistent 11/18/2021   ESRD (end stage renal disease) on dialysis 05/06/2022   Ulcerative colitis 06/02/2022   Coag negative Staphylococcus bacteremia 06/06/2022   DOE (dyspnea on exertion) 07/22/2022   Senile purpura 08/21/2022   GI bleeding 10/07/2022   Resolved Ambulatory Problems    Diagnosis Date Noted   ESRD (end stage renal disease) 10/22/2021   Sepsis 06/02/2022   Chronic hypotension 0000000   Acute metabolic encephalopathy 0000000   Hemodialysis catheter infection, sequela 06/06/2022   Encephalopathy acute 06/06/2022   Past Medical History:  Diagnosis Date   A-fib    Anemia    Arthritis    Cancer    COVID-19    Dyspnea    Dysrhythmia    History of DVT (deep vein thrombosis)    Hydronephrosis     Ileostomy in place    Obstructive uropathy    Old retinal detachment, total or subtotal    Sleep apnea  Stroke    Ureteral stricture     Outpatient Medications Prior to Visit  Medication Sig   cyanocobalamin (VITAMIN B12) 1000 MCG tablet Take 1,000 mcg by mouth daily.   ELIQUIS 2.5 MG TABS tablet Take 1 tablet (2.5 mg total) by mouth 2 (two) times daily.   latanoprost (XALATAN) 0.005 % ophthalmic solution Place 1 drop into the right eye at bedtime.   midodrine (PROAMATINE) 10 MG tablet Take 1 tablet (10 mg total) by mouth 3 (three) times daily. Takes 5 mg plus 10mg  for 15 mg total three times a day- started by nephrology in [place]   nystatin (MYCOSTATIN/NYSTOP) powder Apply 1 Application topically 3 (three) times daily.   Omega 3 1000 MG CAPS Take 1,000 mg by mouth daily.   pregabalin (LYRICA) 50 MG capsule Take 1 capsule (50 mg total) by mouth daily.   sevelamer carbonate (RENVELA) 800 MG tablet Take 2,400 mg by mouth 3 (three) times daily with meals.   sodium zirconium cyclosilicate (LOKELMA) 10 g PACK packet Take 10 g by mouth 4 (four) times a week. Non Dialysis days- Tues, Thurs, Saturday, Sunday   timolol (TIMOPTIC) 0.5 % ophthalmic solution Place 1 drop into both eyes daily.   No facility-administered medications prior to visit.    HPI  Updated and modified:  Problem  GI Bleeding   More fluid mixed with blood "pinkish" since leaving emergency room Has ostomy but no blood from that Its rectal bleeding with minimal blood loss and no potential for massive blood loss going undetected Lab Results  Component Value Date/Time   HGB 9.7 (L) 09/27/2022 10:28 AM   HGB 11.6 (L) 07/17/2022 10:34 AM   HGB 8.2 (L) 06/06/2022 07:56 AM   HGB 8.0 (L) 06/05/2022 05:01 AM   HGB 8.2 (L) 06/04/2022 08:44 AM   HGB 8.1 (L) 06/03/2022 05:00 AM  Emergency room note 09/27/22 Clinical Laboratory Tests Ordered, included with anemia at 9.7 down from 11.6 in mid January. No leukocytosis. Grossly  positive hemoccult.    Medical Decision Making: Summary:  Patient presents emergency department with bleeding from his rectum.  He has history of colectomy.  I do not appreciate any heavy bleeding from the pouch.  No laceration or fissure.  No external hemorrhoid.  No abdominal pain or tenderness.  He is on Eliquis but is hemodynamically stable, awake, alert.  Plan to start suppository Anusol for possible internal hemorrhoid.  Will have him follow with his GI doctors in the coming week to consider endoscopic visualization if symptoms continue.               Clinical Data Analysis:   Physical Exam  BP 124/70 (BP Location: Left Arm, Patient Position: Sitting)   Pulse 63   Temp 98.4 F (36.9 C) (Temporal)   Ht 5\' 8"  (1.727 m)   Wt 170 lb 6.4 oz (77.3 kg)   SpO2 100%   BMI 25.91 kg/m  Wt Readings from Last 10 Encounters:  10/07/22 170 lb 6.4 oz (77.3 kg)  08/21/22 167 lb (75.8 kg)  08/14/22 168 lb 3.2 oz (76.3 kg)  08/07/22 170 lb 9.6 oz (77.4 kg)  07/22/22 165 lb (74.8 kg)  07/03/22 172 lb 9.6 oz (78.3 kg)  06/26/22 167 lb 3.2 oz (75.8 kg)  06/06/22 160 lb 11.5 oz (72.9 kg)  05/22/22 168 lb 12.8 oz (76.6 kg)  05/06/22 166 lb (75.3 kg)   Vital signs reviewed.  Nursing notes reviewed. Weight trend reviewed. Abnormalities and Problem-Specific physical exam  findings:  very clear headed, no increased work of breathing , dehydration, or tachycardia. General Appearance:  No acute distress appreciable.   Well-groomed, healthy-appearing male.  Well proportioned with no abnormal fat distribution.  Good muscle tone. Skin: Clear and well-hydrated. Pulmonary:  Normal work of breathing at rest, no respiratory distress apparent. SpO2: 100 %  Musculoskeletal: Patient demonstrates smooth and coordinated movements throughout all major joints.All extremities are intact.  Neurological:  Awake, alert, oriented, and engaged.  No obvious focal neurological deficits or cognitive impairments.  Sensorium  seems unclouded. Gait is smooth and coordinated.  Speech is clear and coherent with logical content. Psychiatric:  Appropriate mood, pleasant and cooperative demeanor, cheerful and engaged during the exam    Additional Results Reviewed:     Results for orders placed or performed in visit on 10/07/22  CBC  Result Value Ref Range   WBC 8.9 4.0 - 10.5 K/uL   RBC 2.66 (L) 4.22 - 5.81 Mil/uL   Platelets 175.0 150.0 - 400.0 K/uL   Hemoglobin 10.4 (L) 13.0 - 17.0 g/dL   HCT 31.3 (L) 39.0 - 52.0 %   MCV 117.6 Repeated and verified X2. (H) 78.0 - 100.0 fl   MCHC 33.3 30.0 - 36.0 g/dL   RDW 16.5 (H) 11.5 - 15.5 %  Ferritin  Result Value Ref Range   Ferritin 881.4 (H) 22.0 - 322.0 ng/mL    Recent Results (from the past 2160 hour(s))  Basic Metabolic Panel (BMET)     Status: Abnormal   Collection Time: 07/17/22 10:34 AM  Result Value Ref Range   Glucose 69 (L) 70 - 99 mg/dL   BUN 37 (H) 8 - 27 mg/dL   Creatinine, Ser 8.65 (H) 0.76 - 1.27 mg/dL   eGFR 6 (L) >59 mL/min/1.73   BUN/Creatinine Ratio 4 (L) 10 - 24   Sodium 139 134 - 144 mmol/L   Potassium 4.9 3.5 - 5.2 mmol/L   Chloride 96 96 - 106 mmol/L   CO2 29 20 - 29 mmol/L   Calcium 10.2 8.6 - 10.2 mg/dL  CBC     Status: Abnormal   Collection Time: 07/17/22 10:34 AM  Result Value Ref Range   WBC 8.1 3.4 - 10.8 x10E3/uL   RBC 3.05 (L) 4.14 - 5.80 x10E6/uL   Hemoglobin 11.6 (L) 13.0 - 17.7 g/dL   Hematocrit 34.3 (L) 37.5 - 51.0 %   MCV 113 (H) 79 - 97 fL   MCH 38.0 (H) 26.6 - 33.0 pg   MCHC 33.8 31.5 - 35.7 g/dL   RDW 14.3 11.6 - 15.4 %   Platelets 144 (L) 150 - 450 x10E3/uL  POC COVID-19 BinaxNow     Status: None   Collection Time: 08/21/22 10:16 AM  Result Value Ref Range   SARS Coronavirus 2 Ag Negative Negative  POCT Influenza A/B     Status: None   Collection Time: 08/21/22 10:36 AM  Result Value Ref Range   Influenza A, POC Negative Negative   Influenza B, POC Negative Negative  CBC with Differential     Status:  Abnormal   Collection Time: 09/27/22 10:28 AM  Result Value Ref Range   WBC 9.6 4.0 - 10.5 K/uL   RBC 2.50 (L) 4.22 - 5.81 MIL/uL   Hemoglobin 9.7 (L) 13.0 - 17.0 g/dL   HCT 28.6 (L) 39.0 - 52.0 %   MCV 114.4 (H) 80.0 - 100.0 fL   MCH 38.8 (H) 26.0 - 34.0 pg   MCHC 33.9 30.0 -  36.0 g/dL   RDW 15.2 11.5 - 15.5 %   Platelets 137 (L) 150 - 400 K/uL   nRBC 0.0 0.0 - 0.2 %   Neutrophils Relative % 72 %   Neutro Abs 6.9 1.7 - 7.7 K/uL   Lymphocytes Relative 12 %   Lymphs Abs 1.1 0.7 - 4.0 K/uL   Monocytes Relative 13 %   Monocytes Absolute 1.3 (H) 0.1 - 1.0 K/uL   Eosinophils Relative 2 %   Eosinophils Absolute 0.2 0.0 - 0.5 K/uL   Basophils Relative 0 %   Basophils Absolute 0.0 0.0 - 0.1 K/uL   WBC Morphology MORPHOLOGY UNREMARKABLE    Smear Review Normal platelet morphology    Immature Granulocytes 1 %   Abs Immature Granulocytes 0.05 0.00 - 0.07 K/uL   Polychromasia PRESENT     Comment: Performed at [facility], [address], [place], [place] [address]  Occult blood card to lab, stool     Status: Abnormal   Collection Time: 09/27/22 11:18 AM  Result Value Ref Range   Fecal Occult Bld POSITIVE (A) NEGATIVE    Comment: Performed at [facility], [address] [place], [place] [address]  CBC     Status: Abnormal   Collection Time: 10/07/22 12:32 PM  Result Value Ref Range   WBC 8.9 4.0 - 10.5 K/uL   RBC 2.66 (L) 4.22 - 5.81 Mil/uL   Platelets 175.0 150.0 - 400.0 K/uL   Hemoglobin 10.4 (L) 13.0 - 17.0 g/dL   HCT 31.3 (L) 39.0 - 52.0 %   MCV 117.6 Repeated and verified X2. (H) 78.0 - 100.0 fl   MCHC 33.3 30.0 - 36.0 g/dL   RDW 16.5 (H) 11.5 - 15.5 %  Ferritin     Status: Abnormal   Collection Time: 10/07/22 12:32 PM  Result Value Ref Range   Ferritin 881.4 (H) 22.0 - 322.0 ng/mL    No image results found.   LONG TERM MONITOR (3-14 DAYS)  Result Date: 08/01/2022   Patch wear time was 6 days and 21 hours   Predominant rhythm is  NSR with average HR 75bpm   There were 2759 runs of SVT with longest lasting 14 beats   Mobitz type I ABV was present   Occasional SVE (4.1%), rare VE (<1%)   No Afib or significant pauses   No patient triggered events Patch Wear Time:  6 days and 21 hours (2024-01-16T11:47:50-0500 to 2024-01-23T09:36:21-0500) Patient had a min HR of 34 bpm, max HR of 182 bpm, and avg HR of 75 bpm. Predominant underlying rhythm was Sinus Rhythm. First Degree AV Block was present. Bundle Branch Block/IVCD was present. 2759 Supraventricular Tachycardia runs occurred, the run with the fastest interval lasting 4 beats with a max rate of 182 bpm, the longest lasting 14 beats with an avg rate of 105 bpm. Second Degree AV Block-Mobitz I (Wenckebach) was present. Isolated SVEs were occasional (4.1%, [id number]), SVE Couplets were rare (<1.0%, 2802), and SVE Triplets were rare (<1.0%, 729). Isolated VEs were rare (<1.0%), VE Couplets were rare (<1.0%), and no VE Triplets were present. Ventricular Trigeminy was present. Gwyndolyn Kaufman, MD   IR NEPHROSTOMY EXCHANGE LEFT  Result Date: 07/29/2022 INDICATION: Routine exchange. Briefly, 87 y.o. year old male who is a poor historian and comorbid significantly for ESRD on MWF HD, Hx of IBD (UC) s/p multiple abdominal surgeries with reported ureteral complication and L PCN placement in [place]. EXAM: LEFT- SIDED NEPHROSTOMY CATHETER EXCHANGE COMPARISON:  IR fluoroscopy,  04/22/2022. CONTRAST:  10 mL Isovue-300 administered into the collecting system FLUOROSCOPY TIME:  Fluoroscopic dose; 1 mGy COMPLICATIONS: None immediate. TECHNIQUE: Informed written consent was obtained from the patient after a discussion of the risks, benefits and alternatives to treatment. Questions regarding the procedure were encouraged and answered. A timeout was performed prior to the initiation of the procedure. The LEFT flank and external portion of existing nephrostomy catheter were prepped and draped in the usual sterile  fashion. A sterile drape was applied covering the operative field. Maximum barrier sterile technique with sterile gowns and gloves were used for the procedure. A timeout was performed prior to the initiation of the procedure. A pre procedural spot fluoroscopic image was obtained after contrast was injected via the existing nephrostomy catheter demonstrating appropriate positioning within the renal pelvis. The existing nephrostomy catheter was cut and cannulated with a Benson wire which was coiled within the renal pelvis. Under intermittent fluoroscopic guidance, the existing nephrostomy catheter was exchanged for a new 8.5 Fr catheter. Contrast injection confirmed appropriate positioning within the renal pelvis and a post exchange fluoroscopic image was obtained. The catheter was locked and secured to the skin with a suture. A dressing was placed. The patient tolerated the procedure well without immediate postprocedural complication. FINDINGS: The existing nephrostomy catheter is appropriately positioned and functioning. After successful fluoroscopic guided exchange, the new nephrostomy catheter is coiled and locked within the LEFT renal pelvis. IMPRESSION: Successful exchange of LEFT sided 8.5 Fr percutaneous nephrostomy catheter. PLAN: Patient will return to Vascular Interventional Radiology (VIR) for routine drainage catheter evaluation and exchange in 10-12 weeks. Michaelle Birks, MD [facility] Radiology Electronically Signed   By: Michaelle Birks M.D.   On: 07/29/2022 17:14   CARDIAC CATHETERIZATION  Result Date: 07/22/2022   The left ventricular systolic function is normal.   LV end diastolic pressure is normal.   The left ventricular ejection fraction is 55-65% by visual estimate. No significant CAD Normal LV function Normal LVEDP Plan: medical management     --------------------------------    Signed: Loralee Pacas, MD 10/07/2022 7:40 PM

## 2022-10-07 NOTE — Patient Instructions (Signed)
we decided to cut the Eliquis to just 1 tablet a day for 3 days and if it continues to bleed cut it to 0 tablets daily. I considered maybe trying to use a tampon or something to try to tamponade the rectal bleeding but decided since it was not designed for rectum not to go that direction and just use more Anusol. If all that fails then need to see a colorectal surgeon who can do a anoscopy and cautery or hemorrhoid banding - call me if you want that

## 2022-10-18 ENCOUNTER — Other Ambulatory Visit: Payer: Self-pay | Admitting: Family Medicine

## 2022-10-21 ENCOUNTER — Inpatient Hospital Stay (HOSPITAL_COMMUNITY): Admission: RE | Admit: 2022-10-21 | Payer: Medicare Other | Source: Ambulatory Visit

## 2022-10-21 ENCOUNTER — Ambulatory Visit (INDEPENDENT_AMBULATORY_CARE_PROVIDER_SITE_OTHER): Payer: Medicare Other | Admitting: Family Medicine

## 2022-10-21 ENCOUNTER — Encounter: Payer: Self-pay | Admitting: Family Medicine

## 2022-10-21 VITALS — BP 130/72 | HR 82 | Temp 98.4°F | Ht 68.0 in | Wt 170.4 lb

## 2022-10-21 DIAGNOSIS — K625 Hemorrhage of anus and rectum: Secondary | ICD-10-CM | POA: Diagnosis not present

## 2022-10-21 DIAGNOSIS — I4819 Other persistent atrial fibrillation: Secondary | ICD-10-CM

## 2022-10-21 NOTE — Progress Notes (Signed)
Phone [phone] In person visit   Subjective:   Joshua Riley is a 87 y.o. year old very pleasant male patient who presents for/with See problem oriented charting Chief Complaint  Patient presents with   ER f/u    Pt is here for ER F/U due to rectal bleeding.   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     ELIQUIS 2.5 MG TABS tablet Take 1 tablet (2.5 mg total) by mouth 2 (two) times daily. 60 tablet 11   ferrous sulfate 325 (65 FE) MG EC tablet Take 1 tablet (325 mg total) by mouth 2 (two) times daily before a meal. 60 tablet 2   hydrocortisone (ANUSOL-HC) 25 MG suppository Place 1 suppository (25 mg total) rectally 2 (two) times daily. 12 suppository 0   latanoprost (XALATAN) 0.005 % ophthalmic solution Place 1 drop into the right eye at bedtime.     midodrine (PROAMATINE) 10 MG tablet Take 1 tablet (10 mg total) by mouth 3 (three) times daily. Takes 5 mg plus  for 15 mg total three times a day- started by nephrology in [place] 270 tablet 3   midodrine (PROAMATINE) 5 MG tablet TAKE 1 TABLET 3 TIMES DAILY WITH MEALS.  TAKES 5 MG PLUS  FOR 15 MG TOTAL THREE TIMES A DAY- STARTED BY NEPHROLOGY IN [place] 270 tablet 3   nystatin (MYCOSTATIN/NYSTOP) powder Apply 1 Application topically 3 (three) times daily. 60 g 4   Omega 3 1000 MG CAPS Take 1,000 mg by mouth daily.     pregabalin (LYRICA) 50 MG capsule Take 1 capsule (50 mg total) by mouth daily. 90 capsule 3   sevelamer carbonate (RENVELA) 800 MG tablet Take 2,400 mg by mouth 3 (three) times daily with meals.     sodium zirconium cyclosilicate (LOKELMA) 10 g PACK packet Take 10 g by mouth 4 (four) times a week. Non Dialysis days- Tues, Thurs, Saturday, Sunday     timolol (TIMOPTIC) 0.5 % ophthalmic solution Place 1 drop into both eyes daily.     No current facility-administered medications for this visit.     Objective:  BP 130/72   Pulse 82   Temp 98.4 F (36.9 C)   Ht  (1.727 m)   Wt 170 lb 6.4 oz (77.3 kg)   SpO2 99%   BMI 25.91 kg/m  Gen: NAD, resting comfortably CV: RRR (at least appears regular today) no murmurs rubs or gallops Lungs: CTAB no crackles, wheeze, rhonchi Ext: no edema Skin: warm, dry Rectal exam:-Unable to penetrate the rectum due to tightness of surrounding skin-no  visible discharge or blood.  No fissures or lacerations noted.  No external or protruding internal hemorrhoids noted    Assessment and Plan   # ED follow up for rectal bleeding S: Patient presented to the emergency department on 09/27/2022 for rectal bleeding.  Patient reported prior issues related to pressure sores or hemorrhoids that typically resolved with steroid cream.  He tried this prior to the emergency department visit without relief.  At time of visit had about 5 days of moderate bright red blood per rectum.  No abdominal pain was reported.  Of note patient has a colostomy in relation to ulcerative colitis so only has a pouch in his rectum.  On exam no external hemorrhoids were noted.  There was some scant maroon blood on exam finger without fissure  or laceration-patient did not report pain.  Anemia was actually slightly improved from the last check  He was given hemorrhoid suppositories to see if this would help for possible internal hemorrhoid and recommended to follow-up with GI outpatient.  Patient saw Dr. Jon Billings on 10/07/2022 and reported some mild improvement with hydrocortisone.  Minimal bleeding so they did not repeat CBC.  They discussed possible referral to anorectal surgeon-another round of hydrocortisone was tried.  They jointly decided to use 1 tablet a day for 3 days of Eliquis and cutting the 0 if continued to bleed.  If some concern about risk of using something like a tampon  No more pink discharge. Getting some watery brown discharge. Di no eliquis for about a week then went back on 1. Was getting brownish discharge regardless- today has had no discharge- plans to restart eliquis 2.5 mg BID A/P: Patient with history of colectomy for ulcerative colitis with no blood or melena in colectomy bag but with recent rectal bleeding with bright red blood which later changed to pinkish discoloration and now is a brownish discharge with no discharge noted today.  Patient has been holding Eliquis for a week and resumed once a day with no worsening symptoms - We discussed potential referral to anorectal surgeon and he wants to hold off as he feels like he is improving.  Also consider GI but do not strongly suspect symptoms are a sign of ulcerative colitis - I was unable to do a digital rectal exam-could not penetrate finger through rectum but I did not see any obvious external or internal hemorrhoids or fissures or lacerations-no discharge was noted on his pad either from today -Essentially he prefers to reach back out if he has new or worsening symptoms-we will respect his decision -Agree with Dr. Jon Billings from last visit that CBC would likely be low yield  # Atrial fibrillation- sees Dr. Shari Prows S: Rate controlled without  medicine Anticoagulated with eliquis 2.5 mg BID A/P: At the moment not appropriately anticoagulated as only on Eliquis once a day but is going to increase to twice a day and will be appropriately anticoagulated at that point.  Remains rate controlled without medication.   Recommended follow up: Return for as needed for new, worsening, persistent symptoms. Future Appointments  Date Time Provider Department Center  10/28/2022  2:30 PM [facility] [place]  02/10/2023  8:20 AM Shelva Majestic, MD [facility]  02/12/2023  9:40 AM Meriam Sprague, MD [facility]  06/05/2023  9:00 AM [facility] ANNUAL WELLNESS VISIT 1 [facility]    Lab/Order associations:   ICD-10-CM   1. Rectal bleeding  K62.5     2. Atrial fibrillation, persistent  I48.19       No orders of the defined types were placed in this encounter.   Return precautions advised.  Tana Conch, MD

## 2022-10-21 NOTE — Patient Instructions (Addendum)
Let us know if worsening issues with brownish discharge or if recurrent issues with the pink/blood- or if it just fails to go away in next week or two and we can refer to anorectal surgeon  Recommended follow up: Return for as needed for new, worsening, persistent symptoms.

## 2022-10-23 ENCOUNTER — Ambulatory Visit: Payer: Medicare Other | Admitting: Family Medicine

## 2022-10-27 ENCOUNTER — Telehealth: Payer: Self-pay | Admitting: Family Medicine

## 2022-10-27 DIAGNOSIS — K625 Hemorrhage of anus and rectum: Secondary | ICD-10-CM

## 2022-10-27 NOTE — Telephone Encounter (Signed)
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

## 2022-10-27 NOTE — Telephone Encounter (Signed)
Patient requests Referral to: colorectal surgeon who can do a anoscopy and cautery or hemorrhoid banding  (see AVS dated 10/26/22)

## 2022-10-28 ENCOUNTER — Other Ambulatory Visit: Payer: Self-pay | Admitting: Cardiology

## 2022-10-28 ENCOUNTER — Ambulatory Visit (HOSPITAL_COMMUNITY)
Admission: RE | Admit: 2022-10-28 | Discharge: 2022-10-28 | Disposition: A | Discharge status: Home / Self Care | Payer: Medicare Other | Source: Ambulatory Visit | Attending: Cardiology | Admitting: Cardiology

## 2022-10-28 DIAGNOSIS — Z436 Encounter for attention to other artificial openings of urinary tract: Secondary | ICD-10-CM | POA: Insufficient documentation

## 2022-10-28 DIAGNOSIS — R0602 Shortness of breath: Secondary | ICD-10-CM

## 2022-10-28 DIAGNOSIS — I4891 Unspecified atrial fibrillation: Secondary | ICD-10-CM | POA: Diagnosis not present

## 2022-10-28 HISTORY — PX: IR NEPHROSTOMY EXCHANGE LEFT: IMG6069

## 2022-10-28 MED ORDER — LIDOCAINE HCL 1 % IJ SOLN
INTRAMUSCULAR | Status: AC
  Filled 2022-10-28: qty 20

## 2022-10-28 MED ORDER — IOHEXOL 300 MG/ML  SOLN
50.0000 mL | Freq: Once | INTRAMUSCULAR | Status: AC | PRN
  Administered 2022-10-28: 5 mL

## 2022-10-28 NOTE — Telephone Encounter (Signed)
Referral has been placed. 

## 2022-11-11 ENCOUNTER — Encounter: Payer: Self-pay | Admitting: Family Medicine

## 2022-11-11 ENCOUNTER — Ambulatory Visit (INDEPENDENT_AMBULATORY_CARE_PROVIDER_SITE_OTHER): Payer: Medicare Other | Admitting: Family Medicine

## 2022-11-11 VITALS — BP 123/76 | HR 72 | Temp 97.8°F | Ht 68.0 in | Wt 168.8 lb

## 2022-11-11 DIAGNOSIS — K625 Hemorrhage of anus and rectum: Secondary | ICD-10-CM | POA: Diagnosis not present

## 2022-11-11 DIAGNOSIS — K51919 Ulcerative colitis, unspecified with unspecified complications: Secondary | ICD-10-CM

## 2022-11-11 DIAGNOSIS — Z992 Dependence on renal dialysis: Secondary | ICD-10-CM

## 2022-11-11 DIAGNOSIS — R059 Cough, unspecified: Secondary | ICD-10-CM

## 2022-11-11 DIAGNOSIS — I4819 Other persistent atrial fibrillation: Secondary | ICD-10-CM | POA: Diagnosis not present

## 2022-11-11 DIAGNOSIS — N186 End stage renal disease: Secondary | ICD-10-CM | POA: Diagnosis not present

## 2022-11-11 LAB — POCT INFLUENZA A/B
Influenza A, POC: NEGATIVE
Influenza B, POC: NEGATIVE

## 2022-11-11 LAB — POC COVID19 BINAXNOW: SARS Coronavirus 2 Ag: NEGATIVE

## 2022-11-11 MED ORDER — AZELASTINE HCL 0.1 % NA SOLN: 2.0000 | Freq: Two times a day (BID) | NASAL | 12 refills | Status: DC

## 2022-11-11 MED ORDER — POLYMYXIN B-TRIMETHOPRIM 10000-0.1 UNIT/ML-% OP SOLN: 1.0000 [drp] | OPHTHALMIC | 0 refills | Status: DC

## 2022-11-11 MED ORDER — HYDROCORTISONE ACETATE 25 MG RE SUPP: 25.0000 mg | Freq: Two times a day (BID) | RECTAL | 0 refills | Status: DC

## 2022-11-11 MED ORDER — AMOXICILLIN 500 MG PO CAPS: 500.0000 mg | ORAL_CAPSULE | Freq: Two times a day (BID) | ORAL | 0 refills | Status: AC

## 2022-11-11 NOTE — Progress Notes (Signed)
   Joshua Riley is a 87 y.o. male who presents today for an office visit.  Assessment/Plan:  New/Acute Problems: Cough Likely secondary to viral URI.  Covid and flu negative today. No red flag signs or symptoms.  Has a reassuring exam today.  May have developing sinusitis.  Will start Astelin nasal spray.  We will send in renally dosed amoxicillin with instruction to not start unless symptoms do not improve in the next few days.  Rectal Bleeding Potential this could be ulcerative colitis however does need further evaluation for this and will be seeing general surgery in a couple of weeks.  He did have improvement with hydrocortisone suppositories recently and we will refill this today.  Vital signs are normal today. He is now holding his Eliquis.  He is aware of reasons to go to the emergency department.  Chronic Problems Addressed Today: Atrial Fibrillation Now holding on Eliquis until he gets rectal bleeding workup as above.  Ulcerative Colitis  Follows with GI.  Potentially could be causing some of his rectal bleeding as above.  ESRD Renally dose amoxicillin as above.    Subjective:  HPI:  See Assessment / plan for status of chronic conditions.  He is here today with cough, congestion, and chills.This has been going on for about a week. No known sick contacts. Over the last few days seems to be getting worse. Some sputum production. No shortness of breath. No treatments tried. A lot of rhinorrhea.  Some chills at dialysis.  No fevers.  He has also been having ongoing issues with rectal bleeding for the last several weeks.  Went to the ED about 6 weeks ago for this.  Followed up.  Was seen by different provider.  Was started on hydrocortisone suppositories and symptoms had improved with this.  He did see his PCP a couple weeks ago for this and was referred to general surgery.  Has a history of a colectomy for ulcerative colitis.  He is not having any blood in his colostomy bag.  He  has had little more rectal bleeding the last week or so since stopping his suppository.  No pain.       Objective:  Physical Exam: Temp 97.8 F (36.6 C) (Temporal)   Ht 5\' 8"  (1.727 m)   Wt 168 lb 12.8 oz (76.6 kg)   BMI 25.67 kg/m   Gen: No acute distress, resting comfortably HEENT: Nasal congestion noted.  No lymphadenopathy.  Conjunctivitis in bilateral eyes. CV: Irregular rate and rhythm with no murmurs appreciated Pulm: Normal work of breathing, clear to auscultation bilaterally with no crackles, wheezes, or rhonchi Neuro: Grossly normal, moves all extremities Psych: Normal affect and thought content      Lenni Reckner M. Jimmey Ralph, MD 11/11/2022 7:38 AM

## 2022-11-11 NOTE — Patient Instructions (Addendum)
It was very nice to see you today!  Your flu and COVID tests are negative.   You probably have a sinus infection.  Start the nasal spray.  Start antibiotic if not improving.  Use eyedrops as needed.  I will refill your suppository today.  Let us know if the bleeding worsens.  Return if symptoms worsen or fail to improve.   Take care, Dr Jimmey Ralph  PLEASE NOTE:  If you had any lab tests, please let us know if you have not heard back within a few days. You may see your results on mychart before we have a chance to review them but we will give you a call once they are reviewed by [place].   If we ordered any referrals today, please let us know if you have not heard from their office within the next week.   If you had any urgent prescriptions sent in today, please check with the pharmacy within an hour of our visit to make sure the prescription was transmitted appropriately.   Please try these tips to maintain a healthy lifestyle:  Eat at least 3 REAL meals and 1-2 snacks per day.  Aim for no more than 5 hours between eating.  If you eat breakfast, please do so within one hour of getting up.   Each meal should contain half fruits/vegetables, one quarter protein, and one quarter carbs (no bigger than a computer mouse)  Cut down on sweet beverages. This includes juice, soda, and sweet tea.   Drink at least 1 glass of water with each meal and aim for at least 8 glasses per day  Exercise at least 150 minutes every week.

## 2022-11-19 ENCOUNTER — Other Ambulatory Visit: Payer: Self-pay | Admitting: Family Medicine

## 2022-11-27 ENCOUNTER — Other Ambulatory Visit (HOSPITAL_COMMUNITY): Payer: Self-pay | Admitting: Surgery

## 2022-11-27 DIAGNOSIS — Z433 Encounter for attention to colostomy: Secondary | ICD-10-CM

## 2022-11-27 DIAGNOSIS — S31839A Unspecified open wound of anus, initial encounter: Secondary | ICD-10-CM

## 2022-12-04 ENCOUNTER — Ambulatory Visit (HOSPITAL_COMMUNITY)
Admission: RE | Admit: 2022-12-04 | Discharge: 2022-12-04 | Disposition: A | Discharge status: Home / Self Care | Payer: Medicare Other | Source: Ambulatory Visit | Attending: Surgery | Admitting: Surgery

## 2022-12-04 DIAGNOSIS — Z433 Encounter for attention to colostomy: Secondary | ICD-10-CM | POA: Diagnosis present

## 2022-12-04 DIAGNOSIS — S31839A Unspecified open wound of anus, initial encounter: Secondary | ICD-10-CM | POA: Diagnosis present

## 2022-12-04 MED ORDER — GADOBUTROL 1 MMOL/ML IV SOLN
7.5000 mL | Freq: Once | INTRAVENOUS | Status: AC | PRN
  Administered 2022-12-04: 7.5 mL via INTRAVENOUS

## 2022-12-05 ENCOUNTER — Telehealth: Payer: Self-pay | Admitting: Family Medicine

## 2022-12-05 NOTE — Telephone Encounter (Signed)
Patient dropped off document  Medical Clearance , to be filled out by provider. Patient requested to send it via Fax within ASAP. Document is located in providers tray at front office.Please advise at Mobile [phone] (mobile)

## 2022-12-09 NOTE — Telephone Encounter (Signed)
Form placed in your to sign folder.

## 2022-12-10 NOTE — Telephone Encounter (Signed)
Given back to [place]

## 2022-12-12 NOTE — Telephone Encounter (Signed)
Form has been faxed back.

## 2022-12-31 ENCOUNTER — Telehealth: Payer: Self-pay | Admitting: Family Medicine

## 2022-12-31 NOTE — Telephone Encounter (Signed)
Patient called stating that [facility] has reached out to [place] via fax in regards to order for his ostomy bag supplies to be sent. Patient states he called them today and they are unable to call us themselves. Patient is requesting we give them a call @ [phone].

## 2023-01-06 NOTE — Telephone Encounter (Signed)
Called and made pt aware also. Once I receive fax and paperwork is filled out, I will call and make pt aware also.

## 2023-01-06 NOTE — Telephone Encounter (Signed)
Patient called back for an update. States he needs these supplies since he is running out.

## 2023-01-06 NOTE — Telephone Encounter (Signed)
Called and spoke with [place] and informed her I had not received any paperwork from them. Eugenie Birks read the fax number and apparently the fax number she had was to another office. I gave her the fax number to my desk [phone] and she stated she will fax the papers over today.

## 2023-01-20 ENCOUNTER — Other Ambulatory Visit (HOSPITAL_COMMUNITY): Payer: Medicare Other

## 2023-02-03 ENCOUNTER — Telehealth (HOSPITAL_COMMUNITY): Payer: Self-pay | Admitting: Radiology

## 2023-02-03 NOTE — Telephone Encounter (Signed)
Pt called and left 3 voice mails within a 2 hour time period on 02/03/23. I called him back within one hour of him leaving the voice mails. On the VM he was extremely rude and cursing me. He asked for the address to our facility for an appt he has on Thurs. He continued to curse me on the call as well. I gave him the address and he hung up on me. Patient is extremely rude! JM

## 2023-02-05 ENCOUNTER — Ambulatory Visit (HOSPITAL_COMMUNITY)
Admission: RE | Admit: 2023-02-05 | Discharge: 2023-02-05 | Disposition: A | Discharge status: Home / Self Care | Payer: Medicare Other | Source: Ambulatory Visit | Attending: Interventional Radiology | Admitting: Interventional Radiology

## 2023-02-05 DIAGNOSIS — Z436 Encounter for attention to other artificial openings of urinary tract: Secondary | ICD-10-CM | POA: Diagnosis present

## 2023-02-05 DIAGNOSIS — I4891 Unspecified atrial fibrillation: Secondary | ICD-10-CM | POA: Insufficient documentation

## 2023-02-05 DIAGNOSIS — R0602 Shortness of breath: Secondary | ICD-10-CM | POA: Diagnosis not present

## 2023-02-05 HISTORY — PX: IR NEPHROSTOMY EXCHANGE LEFT: IMG6069

## 2023-02-05 MED ORDER — LIDOCAINE HCL 1 % IJ SOLN
INTRAMUSCULAR | Status: AC
  Filled 2023-02-05: qty 20

## 2023-02-05 MED ORDER — LIDOCAINE HCL 1 % IJ SOLN: 20.0000 mL | Freq: Once | INTRAMUSCULAR | Status: DC

## 2023-02-05 MED ORDER — IOHEXOL 300 MG/ML  SOLN
50.0000 mL | Freq: Once | INTRAMUSCULAR | Status: AC | PRN
  Administered 2023-02-05: 10 mL

## 2023-02-05 NOTE — Procedures (Signed)
Interventional Radiology Procedure Note  Procedure: Image guided left PCN exchange, routine.  8.37F pigtail..  Complications: None     Recommendations: - Routine drain care    - routine wound care  Signed,  Yvone Neu. Loreta Ave, DO

## 2023-02-10 ENCOUNTER — Encounter: Payer: Self-pay | Admitting: Family Medicine

## 2023-02-10 ENCOUNTER — Ambulatory Visit (INDEPENDENT_AMBULATORY_CARE_PROVIDER_SITE_OTHER): Payer: Medicare Other | Admitting: Family Medicine

## 2023-02-10 VITALS — BP 112/80 | HR 74 | Temp 97.8°F | Ht 68.0 in | Wt 166.6 lb

## 2023-02-10 DIAGNOSIS — Z992 Dependence on renal dialysis: Secondary | ICD-10-CM

## 2023-02-10 DIAGNOSIS — I951 Orthostatic hypotension: Secondary | ICD-10-CM

## 2023-02-10 DIAGNOSIS — K51919 Ulcerative colitis, unspecified with unspecified complications: Secondary | ICD-10-CM

## 2023-02-10 DIAGNOSIS — N186 End stage renal disease: Secondary | ICD-10-CM | POA: Diagnosis not present

## 2023-02-10 DIAGNOSIS — I4819 Other persistent atrial fibrillation: Secondary | ICD-10-CM

## 2023-02-10 MED ORDER — FAMOTIDINE 10 MG PO TABS: ORAL_TABLET | ORAL | 3 refills | Status: DC

## 2023-02-10 NOTE — Progress Notes (Signed)
Phone [phone] In person visit   Subjective:   Joshua Riley is a 87 y.o. year old very pleasant male patient who presents for/with See problem oriented charting Chief Complaint  Patient presents with   Medical Management of Chronic Issues   Gastroesophageal Reflux    Pt c/o reflux flare up   trembling hands   long covid symptoms    Pt c/o long COVID breathing issues that come and go   Past Medical History-  Patient Active Problem List   Diagnosis Date Noted   DOE (dyspnea on exertion) 07/22/2022    Priority: High   Ulcerative colitis (HCC) 06/02/2022    Priority: High   ESRD (end stage renal disease) on dialysis (HCC) 05/06/2022    Priority: High   Atrial fibrillation, persistent (HCC) 11/18/2021    Priority: High   Orthostatic hypotension 10/22/2021    Priority: High   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    Priority: High   Attention to urostomy (HCC) 10/22/2021    Priority: High   Glaucoma 11/18/2021    Priority: Medium    Idiopathic neuropathy 10/22/2021    Priority: Medium    Coag negative Staphylococcus bacteremia 06/06/2022    Priority: Low   SBO (small bowel obstruction) (HCC) 11/18/2021    Priority: Low   GI bleeding 10/07/2022    Medications- reviewed and updated Current Outpatient Medications  Medication Sig Dispense Refill   azelastine (ASTELIN) 0.1 % nasal spray Place 2 sprays into both nostrils 2 (two) times daily. 30 mL 12   cyanocobalamin (VITAMIN B12) 1000 MCG tablet Take 1,000 mcg by mouth daily.     ELIQUIS 2.5 MG TABS tablet Take 1 tablet (2.5 mg total) by mouth 2 (two) times daily. 60 tablet 11   famotidine (PEPCID AC) 10 MG tablet Take after dialysis on MWF - let us know if symptoms do not improve 30 tablet 3   latanoprost (XALATAN) 0.005 % ophthalmic solution Place 1 drop into the right eye at bedtime.     midodrine (PROAMATINE) 10 MG tablet Take 1 tablet (10 mg total) by mouth 3 (three) times daily. Takes 5 mg plus 10mg  for 15 mg total  three times a day- started by nephrology in [place] 270 tablet 3   midodrine (PROAMATINE) 5 MG tablet TAKE 1 TABLET 3 TIMES DAILY WITH MEALS. TAKES 5 MG PLUS 10MG  FOR 15 MG TOTAL THREE TIMES A DAY- STARTED BY NEPHROLOGY IN [place] 270 tablet 3   nystatin (MYCOSTATIN/NYSTOP) powder Apply 1 Application topically 3 (three) times daily. 60 g 4   Omega 3 1000 MG CAPS Take 1,000 mg by mouth daily.     pregabalin (LYRICA) 50 MG capsule Take 1 capsule (50 mg total) by mouth daily. 90 capsule 3   sevelamer carbonate (RENVELA) 800 MG tablet Take 2,400 mg by mouth 3 (three) times daily with meals.     sodium zirconium cyclosilicate (LOKELMA) 10 g PACK packet Take 10 g by mouth 4 (four) times a week. Non Dialysis days- Tues, Thurs, Saturday, Sunday     timolol (TIMOPTIC) 0.5 % ophthalmic solution Place 1 drop into both eyes daily.     trimethoprim-polymyxin b (POLYTRIM) ophthalmic solution Place 1 drop into both eyes every 4 (four) hours. 10 mL 0   No current facility-administered medications for this visit.     Objective:  BP 112/80   Pulse 74   Temp 97.8 F (36.6 C)   Ht 5\' 8"  (1.727 m)   Wt 166 lb 9.6 oz (  75.6 kg)   SpO2 95%   BMI 25.33 kg/m  Gen: NAD, resting comfortably CV: irregularly irregular no murmurs rubs or gallops Lungs: CTAB no crackles, wheeze, rhonchi Ext: trace edema right> L Skin: warm, dry     Assessment and Plan   # Pelvic fluid collection-patient with collection of fluid in his abdomen-working with [facility] on this . Has upcoming MRI on September 5th in the [place]- they are hoping to heal perineal wound with sinus tract connecting- they may drain the wound first through IR  # GERD S:Medication: none  -started about a month - denies change in diet. Sometimes even feels nauseous with this. Reports burning after meals- and can be other time. No chest pain A/P: reflux with flare- trial Pepcid post dialysis    # Tremulous hands S:notes with intention. Better with alcohol   A/P: intention tremor noted - no resting tremor- likely essential tremor- gave handout- tolerable without medications for now- with his dialysis would not be best fit for medicine most likely  # Long COVID symptoms- 2 years ago started S: Patient reports since having COVID he has noted ongoing difficulty with shortness of breath that seems to come and go  He is spoken with Dr. Garald Balding of cardiology about his shortness of breath in february of this year. Had reassuring catheterization and echocardiogram. Did have some SVT longest 14 beats without a fib or pauses- but as he has noted seems to come and go since covid -Does have history of anemia related to anemia of chronic disease.  Also add iron deficiency it appears that Dr. Jon Billings started him on ferrous sulfate back in April. A/P: this certainly could be long COVID especially  with prior reassuring cardiac workup- also deconditioning could play a role- not exercising due to his rectal issues. Encouraged foot pedals- he is unsure   # Atrial fibrillation- sees Dr. Shari Prows S: Rate controlled without medicine Anticoagulated with eliquis 2.5 mg BID A/P: appropriately anticoagulated and rate controlled- continue current medicine  #ESRD #Hypotension S: Patient is on dialysis MWF   Medication: Renvela 800 mg-takes 2 tablets 3 times a day.  Requires Lokelma as well 4 times a week on nondialysis days -Does have a tendency towards low blood pressures with dialysis days-takes midodrine 15 mg 3 times a day  prescribed by Dr. Shari Prows originally A/P: ESRD stable- continue current treatment and dialysis  Blood pressure controlled today on midodrine  # colostomy bag, urine bag but can still pee out of penis- very limited   # Idiopathic Neuropathy- started after covid though S: Medication: Lyrica 50 mg started in mid 2022. Mildly helpful A/P: reasonable control- continue current medications    # history of ulcerative colitis led to complete  colectomy in 2012- has colostomy bag (failed J patch) -also has urostomy as complication of colectomy which led to quicker dialysis per his report (sounds like had CKD IV prior to this).  -see complications above   #prior anemia- now off iron and ferritin was high on last check so reasonable  Recommended follow up: Return in about 6 months (around 08/13/2023) for followup or sooner if needed.Schedule b4 you leave. Future Appointments  Date Time Provider Department Center  02/19/2023 10:00 AM Corky Crafts, MD [facility]  06/05/2023  9:00 AM [facility] ANNUAL WELLNESS VISIT 1 [facility]   Lab/Order associations:   ICD-10-CM   1. Atrial fibrillation, persistent (HCC)  I48.19     2. ESRD (end stage renal disease) on dialysis (HCC)  N18.6    Z99.2     3. Ulcerative colitis with complication, unspecified location (HCC)  K51.919     4. Orthostatic hypotension  I95.1       Meds ordered this encounter  Medications   famotidine (PEPCID AC) 10 MG tablet    Sig: Take after dialysis on MWF - let us know if symptoms do not improve    Dispense:  30 tablet    Refill:  3    Return precautions advised.  Tana Conch, MD

## 2023-02-10 NOTE — Patient Instructions (Addendum)
Try Pepcid/famotidine 10 mg only after dialysis on MWF  Glad you are doing well  Recommended follow up: Return in about 6 months (around 08/13/2023) for followup or sooner if needed.Schedule b4 you leave. Such as if reflux not getting better

## 2023-02-12 ENCOUNTER — Ambulatory Visit: Payer: Medicare Other | Admitting: Cardiology

## 2023-02-16 ENCOUNTER — Other Ambulatory Visit: Payer: Self-pay | Admitting: Family Medicine

## 2023-02-16 MED ORDER — PREGABALIN 50 MG PO CAPS: 50.0000 mg | ORAL_CAPSULE | Freq: Every day | ORAL | 3 refills | Status: DC

## 2023-02-16 NOTE — Telephone Encounter (Signed)
Pt was told to tell Joshua Riley the name of his Kidney dr, whose name is [name].   Prescription Request  02/16/2023  LOV: 02/10/2023  What is the name of the medication or equipment?  pregabalin (LYRICA) 50 MG capsule     Have you contacted your pharmacy to request a refill? Yes   Which pharmacy would you like this sent to?    [id number] [place], [place] - [address] Phone: [phone]  Fax: [phone]      Patient notified that their request is being sent to the clinical staff for review and that they should receive a response within 2 business days.   Please advise at Mobile [phone] (mobile)

## 2023-02-19 ENCOUNTER — Ambulatory Visit: Payer: Medicare Other | Attending: Cardiology | Admitting: Interventional Cardiology

## 2023-02-19 ENCOUNTER — Encounter: Payer: Self-pay | Admitting: Interventional Cardiology

## 2023-02-19 VITALS — BP 94/58 | HR 67 | Ht 68.0 in | Wt 166.0 lb

## 2023-02-19 DIAGNOSIS — N186 End stage renal disease: Secondary | ICD-10-CM | POA: Insufficient documentation

## 2023-02-19 DIAGNOSIS — I951 Orthostatic hypotension: Secondary | ICD-10-CM | POA: Insufficient documentation

## 2023-02-19 DIAGNOSIS — I480 Paroxysmal atrial fibrillation: Secondary | ICD-10-CM | POA: Insufficient documentation

## 2023-02-19 DIAGNOSIS — Z7901 Long term (current) use of anticoagulants: Secondary | ICD-10-CM | POA: Diagnosis present

## 2023-02-19 NOTE — Patient Instructions (Signed)
 Medication Instructions:  Your physician recommends that you continue on your current medications as directed. Please refer to the Current Medication list given to you today.  *If you need a refill on your cardiac medications before your next appointment, please call your pharmacy*   Lab Work: none If you have labs (blood work) drawn today and your tests are completely normal, you will receive your results only by: MyChart Message (if you have MyChart) OR A paper copy in the mail If you have any lab test that is abnormal or we need to change your treatment, we will call you to review the results.   Testing/Procedures: none   Follow-Up: At [facility], you and your health needs are our priority.  As part of our continuing mission to provide you with exceptional heart care, we have created designated Provider Care Teams.  These Care Teams include your primary Cardiologist (physician) and Advanced Practice Providers (APPs -  Physician Assistants and Nurse Practitioners) who all work together to provide you with the care you need, when you need it.  We recommend signing up for the patient portal called "MyChart".  Sign up information is provided on this After Visit Summary.  MyChart is used to connect with patients for Virtual Visits (Telemedicine).  Patients are able to view lab/test results, encounter notes, upcoming appointments, etc.  Non-urgent messages can be sent to your provider as well.   To learn more about what you can do with MyChart, go to [email or web address].    Your next appointment:   6 month(s)  Provider:   Jari Favre, PA-C, Ronie Spies, PA-C, Robin Searing, NP, Jacolyn Reedy, PA-C, Eligha Bridegroom, NP, Tereso Newcomer, PA-C, or Perlie Gold, PA-C         Other Instructions

## 2023-02-19 NOTE — Progress Notes (Signed)
Cardiology Office Note   Date:  02/19/2023   ID:  Joshua Riley, DOB 04-Jul-1936, MRN [id number]  PCP:  Shelva Majestic, MD    No chief complaint on file.  AFib  Wt Readings from Last 3 Encounters:  02/19/23 166 lb (75.3 kg)  02/10/23 166 lb 9.6 oz (75.6 kg)  11/11/22 168 lb 12.8 oz (76.6 kg)       History of Present Illness: Joshua Riley is a 87 y.o. male patient of Dr. Shari Prows with a hx of DVT, ulcerative colitis (colostomy 2012), ESRD on HD, hypotension on midodrine, and persistent Afib on apixaban who presents to clinic for follow-up of Afib.   Has known history of persistent Afib for which he has been maintained on eliquis. Not on nodal agents due to hypotension with HD requiring midodrine. Was referred to cardiology for further management.   Per review of prior Cardiology records, has history of stress test in 11/2020 which was negative for ischemia. TTE with normal LVEF with "mitral sclerosis."   Was initially seen in clinic in 05/2022 where he was having worsening SOB with minimal exertion. We scheduled a cath but then was admitted from 11/27-12/2/23  prior to the procedure for fever up to 103 after HD with confusion. WBC 7.8K, influenza, COVID-19 PCR negative.  Blood culture positive for staph. Seen by ID who recommended to remove right groin TDC which was removed by vascular surgery after his left groin AV graft was functional.  Repeat blood cultures negative. He requested to postpone catheterization until early January.    TTE 05/2022 with EF 60-65%, normal strain, normal RV, trivial MR, ascending aorta 42mm.   Ultimately underwent LHC on 07/22/22 which showed no significant CAD and normal LVEDP.   In 08/2022: "He is walking more and is able to go on the treadmill for at 3.31mph and feels well with exertion. Continues to drop BP with HD and remains on midodrine. "  Has had some BRBPR. Decreased ELiquis to once a day. GOing to [facility] for management.    Denies : Chest pain.  Leg edema. Nitroglycerin use. Orthopnea. Palpitations. Paroxysmal nocturnal dyspnea. Shortness of breath. Syncope.    Dizziness on dialysis days is controlled.  Past Medical History:  Diagnosis Date   A-fib (HCC)    Anemia    Arthritis    Cancer (HCC)    Basal cell   COVID-19    2021   Dyspnea    Dysrhythmia    Afib-controlled on eliquis   ESRD (end stage renal disease) (HCC) 10/22/2021   Glaucoma 11/18/2021   History of DVT (deep vein thrombosis)    Hydronephrosis    managed wtih a PCN   Idiopathic neuropathy 10/22/2021   lyrica    Ileostomy in place [facility])    Obstructive uropathy    With chronic left nephrostomy   Old retinal detachment, total or subtotal    Orthostatic hypotension 10/22/2021   Sleep apnea    does not need a machine   Stroke (HCC)    Ulcerative colitis (HCC)    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    Past Surgical History:  Procedure Laterality Date   BASAL CELL CARCINOMA EXCISION     10/23   COLON SURGERY     creation of j pouch     and subsequent takedown of j pouch   EYE SURGERY     IR NEPHROSTOMY EXCHANGE LEFT  12/10/2021   IR NEPHROSTOMY EXCHANGE  LEFT  04/22/2022   IR NEPHROSTOMY EXCHANGE LEFT  07/29/2022   IR NEPHROSTOMY EXCHANGE LEFT  10/28/2022   IR NEPHROSTOMY EXCHANGE LEFT  02/05/2023   LEFT HEART CATH AND CORONARY ANGIOGRAPHY N/A 07/22/2022   Procedure: LEFT HEART CATH AND CORONARY ANGIOGRAPHY;  Surgeon: Swazil[place], Peter M, MD;  Location: [facility] INVASIVE CV LAB;  Service: Cardiovascular;  Laterality: N/A;   REVISION OF ARTERIOVENOUS GORETEX GRAFT Left 05/06/2022   Procedure: REDO LEFT THIGH ARTERIOVENOUS 4-7 MM GORETEX GRAFT;  Surgeon: Chuck Hint, MD;  Location: [facility] OR;  Service: Vascular;  Laterality: Left;   SMALL INTESTINE SURGERY     TOTAL COLECTOMY       Current Outpatient Medications  Medication Sig Dispense Refill   azelastine (ASTELIN) 0.1 % nasal spray Place 2 sprays into  both nostrils 2 (two) times daily. 30 mL 12   cyanocobalamin (VITAMIN B12) 1000 MCG tablet Take 1,000 mcg by mouth daily.     ELIQUIS 2.5 MG TABS tablet Take 1 tablet (2.5 mg total) by mouth 2 (two) times daily. 60 tablet 11   famotidine (PEPCID AC) 10 MG tablet Take after dialysis on MWF - let us know if symptoms do not improve 30 tablet 3   latanoprost (XALATAN) 0.005 % ophthalmic solution Place 1 drop into the right eye at bedtime.     midodrine (PROAMATINE) 10 MG tablet Take 1 tablet (10 mg total) by mouth 3 (three) times daily. Takes 5 mg plus 10mg  for 15 mg total three times a day- started by nephrology in [place] 270 tablet 3   midodrine (PROAMATINE) 5 MG tablet TAKE 1 TABLET 3 TIMES DAILY WITH MEALS. TAKES 5 MG PLUS 10MG  FOR 15 MG TOTAL THREE TIMES A DAY- STARTED BY NEPHROLOGY IN [place] 270 tablet 3   nystatin (MYCOSTATIN/NYSTOP) powder Apply 1 Application topically 3 (three) times daily. 60 g 4   Omega 3 1000 MG CAPS Take 1,000 mg by mouth daily.     pregabalin (LYRICA) 50 MG capsule Take 1 capsule (50 mg total) by mouth daily. 90 capsule 3   sevelamer carbonate (RENVELA) 800 MG tablet Take 2,400 mg by mouth 3 (three) times daily with meals.     sodium zirconium cyclosilicate (LOKELMA) 10 g PACK packet Take 10 g by mouth 4 (four) times a week. Non Dialysis days- Tues, Thurs, Saturday, Sunday     timolol (TIMOPTIC) 0.5 % ophthalmic solution Place 1 drop into both eyes daily.     trimethoprim-polymyxin b (POLYTRIM) ophthalmic solution Place 1 drop into both eyes every 4 (four) hours. 10 mL 0   No current facility-administered medications for this visit.    Allergies:   Cephalosporins, Ciprofloxacin, and Baclofen    Social History:  The patient  reports that he quit smoking about 39 years ago. His smoking use included cigarettes. He started smoking about 45 years ago. He has a 12 pack-year smoking history. He has never been exposed to tobacco smoke. He has never used smokeless tobacco. He  reports current alcohol use of about 5.0 standard drinks of alcohol per week. He reports that he does not use drugs.   Family History:  The patient's family history includes Cancer in his father; Esophageal cancer in his brother; Stroke in his mother.    ROS:  Please see the history of present illness.   Otherwise, review of systems are positive for BRBPR has improved..   All other systems are reviewed and negative.    PHYSICAL EXAM: VS:  BP Marland Kitchen)  94/58   Pulse 67   Ht 5\' 8"  (1.727 m)   Wt 166 lb (75.3 kg)   SpO2 97%   BMI 25.24 kg/m  , BMI Body mass index is 25.24 kg/m. GEN: Well nourished, well developed, in no acute distress HEENT: normal Neck: no JVD, carotid bruits, or masses Cardiac: RRR; no murmurs, rubs, or gallops,no edema  Respiratory:  clear to auscultation bilaterally, normal work of breathing GI: soft, nontender, nondistended, + BS MS: no deformity or atrophy Skin: warm and dry, no rash Neuro:  Strength and sensation are intact Psych: euthymic mood, full affect   EKG:   The ekg ordered today demonstrates regular rhythm, artifact, RBBB   Recent Labs: 06/02/2022: ALT 43 07/17/2022: BUN 37; Creatinine, Ser 8.65; Potassium 4.9; Sodium 139 10/07/2022: Hemoglobin 10.4; Platelets 175.0   Lipid Panel    Component Value Date/Time   CHOL 84 06/04/2022 0844   TRIG 125 06/04/2022 0844   HDL 36 (L) 06/04/2022 0844   CHOLHDL 2.3 06/04/2022 0844   VLDL 25 06/04/2022 0844   LDLCALC 23 06/04/2022 0844     Other studies Reviewed: Additional studies/ records that were reviewed today with results demonstrating: Hbg 10.4 in 4/24.   ASSESSMENT AND PLAN:  AFib: No palpitations.  Has a regular rhythm today.  Has been on Eliquis for stroke prevention.  Due to bright red blood per rectum, he decreased his Eliquis to 2.5 mg daily.  He has a GI evaluation coming up at [facility].  The bleeding has decreased.  We talked about the risks and benefits of anticoagulation.  For now, he will  continue on once a day Eliquis.  If the bleeding were to get worse, would have to consider stopping Eliquis and except the increased risk of stroke.  Blood tests are checked at dialysis so presumably hemoglobin is under control.  I do not have those results available.  Will CC his nephrologist, Dr. Ronalee Belts. ESRD: Hemodialysis 3 times a week. Hypotension: Taking midodrine.  Stable. Anticoagulated: As noted above   Current medicines are reviewed at length with the patient today.  The patient concerns regarding his medicines were addressed.  The following changes have been made:  No change  Labs/ tests ordered today include:   Orders Placed This Encounter  Procedures   EKG 12-Lead    Recommend 150 minutes/week of aerobic exercise Low fat, low carb, high fiber diet recommended  Disposition:   FU in 6 months   Signed, Lance Muss, MD  02/19/2023 10:37 AM    [facility] [address] [place], [place], [place]  [address] Phone: [phone]; Fax: [phone]

## 2023-03-04 ENCOUNTER — Other Ambulatory Visit: Payer: Self-pay

## 2023-03-04 ENCOUNTER — Encounter (HOSPITAL_BASED_OUTPATIENT_CLINIC_OR_DEPARTMENT_OTHER): Payer: Self-pay | Admitting: Emergency Medicine

## 2023-03-04 ENCOUNTER — Emergency Department (HOSPITAL_BASED_OUTPATIENT_CLINIC_OR_DEPARTMENT_OTHER)
Admission: EM | Admit: 2023-03-04 | Discharge: 2023-03-04 | Disposition: A | Discharge status: Home / Self Care | Payer: Medicare Other | Attending: Emergency Medicine | Admitting: Emergency Medicine

## 2023-03-04 DIAGNOSIS — N186 End stage renal disease: Secondary | ICD-10-CM | POA: Insufficient documentation

## 2023-03-04 DIAGNOSIS — Z992 Dependence on renal dialysis: Secondary | ICD-10-CM | POA: Diagnosis not present

## 2023-03-04 DIAGNOSIS — Z87891 Personal history of nicotine dependence: Secondary | ICD-10-CM | POA: Diagnosis not present

## 2023-03-04 DIAGNOSIS — Z4889 Encounter for other specified surgical aftercare: Secondary | ICD-10-CM

## 2023-03-04 DIAGNOSIS — T85698A Other mechanical complication of other specified internal prosthetic devices, implants and grafts, initial encounter: Secondary | ICD-10-CM | POA: Insufficient documentation

## 2023-03-04 DIAGNOSIS — Z7901 Long term (current) use of anticoagulants: Secondary | ICD-10-CM | POA: Diagnosis not present

## 2023-03-04 MED ORDER — HYDROCODONE-ACETAMINOPHEN 5-325 MG PO TABS: 1.0000 | ORAL_TABLET | Freq: Once | ORAL | Status: DC

## 2023-03-04 MED ORDER — OXYCODONE-ACETAMINOPHEN 5-325 MG PO TABS
1.0000 | ORAL_TABLET | Freq: Once | ORAL | Status: AC
  Administered 2023-03-04: 1 via ORAL
  Filled 2023-03-04: qty 1

## 2023-03-04 NOTE — ED Provider Notes (Signed)
[facility] AT [facility] Provider Note   CSN: [id number] Arrival date & time: 03/04/23  1142     History  Chief Complaint  Patient presents with   Drainage Tube Dislodged    Joshua Riley is a 87 y.o. male.  HPI   87 year old male presents emergency department with complaints of with complaints of dislodged tube.  Patient had JP drain placed due to pelvic abscess placed on 02/27/2023.  Patient states that since then, has had continued pain in area.  Has been on antibiotics in the form of Augmentin since procedure.  States that sometime between last night and this morning, end of the drain was dislodged.  Has not been having any drainage.  No increase in pain but with persistent pain.  Has been trying to get in touch with [facility] interventional radiology but had been unsuccessful.  Presents emergency department due to continued pain as well as displaced drain.  Medical history significant for CVA, UC, DVT, ileostomy, A-fib, ESRD with hemodialysis Monday Wednesday Friday, anemia, orthostatic hypotension  Home Medications Prior to Admission medications   Medication Sig Start Date End Date Taking? Authorizing Provider  amoxicillin-clavulanate (AUGMENTIN) 875-125 MG tablet Take 1 tablet by mouth 2 (two) times daily. 02/27/23 03/13/23 Yes [provider]  traMADol (ULTRAM) 50 MG tablet Take 50 mg by mouth every 8 (eight) hours as needed (prn pain). 02/27/23  Yes [provider]  azelastine (ASTELIN) 0.1 % nasal spray Place 2 sprays into both nostrils 2 (two) times daily. 11/11/22   Ardith Dark, MD  calcitRIOL (ROCALTROL) 0.25 MCG capsule Take 1 mcg by mouth daily.    [provider]  cyanocobalamin (VITAMIN B12) 1000 MCG tablet Take 1,000 mcg by mouth daily.    [provider]  ELIQUIS 2.5 MG TABS tablet Take 1 tablet (2.5 mg total) by mouth 2 (two) times daily. 08/07/22   Meriam Sprague, MD  famotidine (PEPCID AC) 10 MG  tablet Take after dialysis on MWF - let us know if symptoms do not improve 02/10/23   Shelva Majestic, MD  finasteride (PROSCAR) 5 MG tablet Take 5 mg by mouth daily.    [provider]  gabapentin (NEURONTIN) 100 MG capsule Take 1 capsule by mouth daily.    [provider]  ibuprofen (ADVIL) 800 MG tablet Take 800 mg by mouth 4 (four) times daily as needed.    [provider]  latanoprost (XALATAN) 0.005 % ophthalmic solution Place 1 drop into the right eye at bedtime.    [provider]  mesalamine (LIALDA) 1.2 g EC tablet Take 2.4 g by mouth daily.    [provider]  midodrine (PROAMATINE) 10 MG tablet Take 1 tablet (10 mg total) by mouth 3 (three) times daily. Takes 5 mg plus 10mg  for 15 mg total three times a day- started by nephrology in [facility] 08/07/22   Meriam Sprague, MD  midodrine (PROAMATINE) 5 MG tablet TAKE 1 TABLET 3 TIMES DAILY WITH MEALS. TAKES 5 MG PLUS 10MG  FOR 15 MG TOTAL THREE TIMES A DAY- STARTED BY NEPHROLOGY IN [place] 10/20/22   Shelva Majestic, MD  nystatin (MYCOSTATIN/NYSTOP) powder Apply 1 Application topically 3 (three) times daily. 07/10/22   Shelva Majestic, MD  Omega 3 1000 MG CAPS Take 1,000 mg by mouth daily.    [provider]  pregabalin (LYRICA) 50 MG capsule Take 1 capsule (50 mg total) by mouth daily. 02/16/23   Shelva Majestic,  MD  propranolol (INDERAL) 10 MG tablet Take 1 tablet by mouth daily.    [provider]  sevelamer carbonate (RENVELA) 800 MG tablet Take 2,400 mg by mouth 3 (three) times daily with meals.    [provider]  sodium zirconium cyclosilicate (LOKELMA) 10 g PACK packet Take 10 g by mouth 4 (four) times a week. Non Dialysis days- [facility], Saturday, Sunday    [provider]  temazepam (RESTORIL) 15 MG capsule Take 15 mg by mouth at bedtime as needed.    [provider]  timolol (TIMOPTIC) 0.5 % ophthalmic solution Place 1 drop into both eyes  daily.    [provider]  trimethoprim-polymyxin b (POLYTRIM) ophthalmic solution Place 1 drop into both eyes every 4 (four) hours. 11/11/22   Ardith Dark, MD  zolpidem (AMBIEN) 5 MG tablet Take 5 mg by mouth at bedtime as needed for sleep.    [provider]      Allergies    Cephalosporins, Ciprofloxacin, and Baclofen    Review of Systems   Review of Systems  All other systems reviewed and are negative.   Physical Exam Updated Vital Signs BP 125/64   Pulse 61   Temp 98 F (36.7 C) (Oral)   Resp 16   Ht 5\' 8"  (1.727 m)   Wt 75.3 kg   SpO2 100%   BMI 25.24 kg/m  Physical Exam Vitals and nursing note reviewed.  Constitutional:      General: He is not in acute distress.    Appearance: He is well-developed.  HENT:     Head: Normocephalic and atraumatic.  Eyes:     Conjunctiva/sclera: Conjunctivae normal.  Cardiovascular:     Rate and Rhythm: Normal rate and regular rhythm.  Pulmonary:     Effort: Pulmonary effort is normal. No respiratory distress.     Breath sounds: Normal breath sounds.  Abdominal:     Palpations: Abdomen is soft.     Tenderness: There is no abdominal tenderness.  Musculoskeletal:        General: No swelling.     Cervical back: Neck supple.  Skin:    General: Skin is warm and dry.     Capillary Refill: Capillary refill takes less than 2 seconds.     Comments: Tenderness around the area of drain.  No obvious extending erythema or appreciable palpable fluctuance.  Neurological:     Mental Status: He is alert.  Psychiatric:        Mood and Affect: Mood normal.     ED Results / Procedures / Treatments   Labs (all labs ordered are listed, but only abnormal results are displayed) Labs Reviewed - No data to display  EKG None  Radiology No results found.  Procedures Procedures    Medications Ordered in ED Medications  oxyCODONE-acetaminophen (PERCOCET/ROXICET) 5-325 MG per tablet 1 tablet (1 tablet Oral Given  03/04/23 1415)    ED Course/ Medical Decision Making/ A&P                                 Medical Decision Making Risk Prescription drug management.   This patient presents to the ED for concern of JP drain malfunction, this involves an extensive number of treatment options, and is a complaint that carries with it a high risk of complications and morbidity.  The differential diagnosis includes JP drain malfunction, cellulitis, abscess, encephalitis   Co  morbidities that complicate the patient evaluation  See HPI   Additional history obtained:  Additional history obtained from EMR External records from outside source obtained and reviewed including hospital records   Lab Tests:  N/a   Imaging Studies ordered:  na   Cardiac Monitoring: / EKG:  The patient was maintained on a cardiac monitor.  I personally viewed and interpreted the cardiac monitored which showed an underlying rhythm of: Sinus rhythm   Consultations Obtained:  N/a   Problem List / ED Course / Critical interventions / Medication management  Wound check I ordered medication including Percocet  Reevaluation of the patient after these medicines showed that the patient improved I have reviewed the patients home medicines and have made adjustments as needed   Social Determinants of Health:  Former cigarette use.  Denies illicit drug use.   Test / Admission - Considered:  Wound check Vitals signs within normal range and stable throughout visit. 87 year old male presents emergency department with complaints of JP drain malfunction after placed from pelvic fluid collection.  Area not erythematous without palpable fluctuance on exam without any change in patient's pain since procedure.  Patient without fever, chills with stable vital signs within normal limits.  Consulted surgical team regarding the patient who recommended removal of drains/dressing and follow-up with routine appointment in early  September.  No further intervention warranted at this time.  Treatment plan discussed at length with patient and family and they acknowledge understanding were agreeable to said plan.  Patient overall well-appearing, afebrile in no acute distress. Worrisome signs and symptoms were discussed with the patient, and the patient acknowledged understanding to return to the ED if noticed. Patient was stable upon discharge.          Final Clinical Impression(s) / ED Diagnoses Final diagnoses:  Encounter for post surgical wound check    Rx / DC Orders ED Discharge Orders     None         Peter Garter, [place] 03/04/23 1744    Franne Forts, DO 03/10/23 1007

## 2023-03-04 NOTE — ED Triage Notes (Signed)
Pt via pov from home with drainage tube dislodged last night. Pt has drainage tube in his back to help with rectal leakage; it was placed last week. Pt alert & oriented, nad noted.

## 2023-03-04 NOTE — ED Notes (Signed)
Pt given discharge instructions. Opportunities given for questions. Pt verbalizes understanding. JP drain removed by PA. Dressing applied and site cleaned. Pt to follow-up with [facility]. Jillyn Hidden, RN

## 2023-03-23 ENCOUNTER — Ambulatory Visit (INDEPENDENT_AMBULATORY_CARE_PROVIDER_SITE_OTHER): Payer: Medicare Other

## 2023-03-23 ENCOUNTER — Telehealth: Payer: Self-pay

## 2023-03-23 ENCOUNTER — Other Ambulatory Visit: Payer: Self-pay | Admitting: Pharmacy Technician

## 2023-03-23 ENCOUNTER — Encounter: Payer: Self-pay | Admitting: Pulmonary Disease

## 2023-03-23 VITALS — BP 124/72 | HR 73 | Temp 98.0°F | Resp 16 | Ht 68.0 in | Wt 162.6 lb

## 2023-03-23 DIAGNOSIS — A498 Other bacterial infections of unspecified site: Secondary | ICD-10-CM | POA: Diagnosis not present

## 2023-03-23 DIAGNOSIS — K611 Rectal abscess: Secondary | ICD-10-CM

## 2023-03-23 DIAGNOSIS — Z1612 Extended spectrum beta lactamase (ESBL) resistance: Secondary | ICD-10-CM

## 2023-03-23 MED ORDER — SODIUM CHLORIDE 0.9 % IV SOLN
500.0000 mg | Freq: Once | INTRAVENOUS | Status: AC
  Administered 2023-03-23: 500 mg via INTRAVENOUS
  Filled 2023-03-23 (×2): qty 500

## 2023-03-23 NOTE — Progress Notes (Signed)
Diagnosis: Infection due to ESBL-producing Escherichia coli   Provider:  Chilton Greathouse MD  Procedure: IV Infusion  IV Type: Peripheral, IV Location: R Antecubital  Ertapenem Dose: 500 mg  Infusion Start Time: 1420  Infusion Stop Time: 1452  Post Infusion IV Care: Observation period completed and Peripheral IV Discontinued  Discharge: Condition: Good, Destination: Home . AVS Provided  Performed by:  Rico Ala, LPN

## 2023-03-23 NOTE — Telephone Encounter (Signed)
Auth Submission: NO AUTH NEEDED Site of care: Site of care: [facility] Payer: Medicare A/B plus [employer] [place] plan Medication & CPT/J Code(s) submitted: ertapenem  Route of submission (phone, fax, portal):  Phone # Fax # Auth type: Buy/Bill PB Units/visits requested: 500mg  x 9 doses Reference number:  Approval from: 03/23/23 to 07/07/23   Medicare will cover 80%, [place] plan will cover the remaining 20%.

## 2023-03-25 ENCOUNTER — Ambulatory Visit (INDEPENDENT_AMBULATORY_CARE_PROVIDER_SITE_OTHER): Payer: Medicare Other

## 2023-03-25 VITALS — BP 114/71 | HR 75 | Temp 98.5°F | Resp 16 | Ht 68.0 in | Wt 163.4 lb

## 2023-03-25 DIAGNOSIS — A498 Other bacterial infections of unspecified site: Secondary | ICD-10-CM

## 2023-03-25 DIAGNOSIS — K611 Rectal abscess: Secondary | ICD-10-CM | POA: Diagnosis not present

## 2023-03-25 DIAGNOSIS — Z1612 Extended spectrum beta lactamase (ESBL) resistance: Secondary | ICD-10-CM | POA: Diagnosis not present

## 2023-03-25 MED ORDER — SODIUM CHLORIDE 0.9 % IV SOLN
500.0000 mg | Freq: Once | INTRAVENOUS | Status: AC
  Administered 2023-03-25: 500 mg via INTRAVENOUS
  Filled 2023-03-25: qty 500

## 2023-03-25 NOTE — Progress Notes (Signed)
Diagnosis: Infection due to ESBL-producing Escherichia coli   Provider:  Chilton Greathouse MD  Procedure: IV Infusion  IV Type: Peripheral, IV Location: R Antecubital  Invanz (ertapenem), Dose: 500 mg  Infusion Start Time: 1319  Infusion Stop Time: 1353  Post Infusion IV Care: Patient declined observation and Peripheral IV Discontinued  Discharge: Condition: Stable, Destination: Home . AVS Declined  Performed by:  Wyvonne Lenz, RN

## 2023-03-27 ENCOUNTER — Telehealth: Payer: Self-pay | Admitting: Primary Care

## 2023-03-27 ENCOUNTER — Ambulatory Visit (INDEPENDENT_AMBULATORY_CARE_PROVIDER_SITE_OTHER): Payer: Medicare Other

## 2023-03-27 ENCOUNTER — Other Ambulatory Visit (INDEPENDENT_AMBULATORY_CARE_PROVIDER_SITE_OTHER): Payer: Medicare Other

## 2023-03-27 VITALS — BP 95/54 | HR 81 | Temp 98.4°F | Resp 17 | Ht 68.0 in | Wt 162.0 lb

## 2023-03-27 DIAGNOSIS — A498 Other bacterial infections of unspecified site: Secondary | ICD-10-CM

## 2023-03-27 DIAGNOSIS — K611 Rectal abscess: Secondary | ICD-10-CM

## 2023-03-27 DIAGNOSIS — Z1612 Extended spectrum beta lactamase (ESBL) resistance: Secondary | ICD-10-CM

## 2023-03-27 LAB — CBC WITH DIFFERENTIAL/PLATELET
Basophils Absolute: 0.1 10*3/uL (ref 0.0–0.1)
Basophils Relative: 0.5 % (ref 0.0–3.0)
Eosinophils Absolute: 0.3 10*3/uL (ref 0.0–0.7)
Eosinophils Relative: 2.9 % (ref 0.0–5.0)
HCT: 30.6 % — ABNORMAL LOW (ref 39.0–52.0)
Hemoglobin: 10.1 g/dL — ABNORMAL LOW (ref 13.0–17.0)
Lymphocytes Relative: 7.9 % — ABNORMAL LOW (ref 12.0–46.0)
Lymphs Abs: 0.8 10*3/uL (ref 0.7–4.0)
MCHC: 33.1 g/dL (ref 30.0–36.0)
MCV: 121.6 fl — ABNORMAL HIGH (ref 78.0–100.0)
Monocytes Absolute: 0.9 10*3/uL (ref 0.1–1.0)
Monocytes Relative: 8.6 % (ref 3.0–12.0)
Neutro Abs: 8.4 10*3/uL — ABNORMAL HIGH (ref 1.4–7.7)
Neutrophils Relative %: 80.1 % — ABNORMAL HIGH (ref 43.0–77.0)
Platelets: 196 10*3/uL (ref 150.0–400.0)
RBC: 2.52 Mil/uL — ABNORMAL LOW (ref 4.22–5.81)
RDW: 16.8 % — ABNORMAL HIGH (ref 11.5–15.5)
WBC: 10.5 10*3/uL (ref 4.0–10.5)

## 2023-03-27 LAB — COMPREHENSIVE METABOLIC PANEL
ALT: 32 U/L (ref 0–53)
AST: 40 U/L — ABNORMAL HIGH (ref 0–37)
Albumin: 3.9 g/dL (ref 3.5–5.2)
Alkaline Phosphatase: 78 U/L (ref 39–117)
BUN: 25 mg/dL — ABNORMAL HIGH (ref 6–23)
CO2: 28 mEq/L (ref 19–32)
Calcium: 9.6 mg/dL (ref 8.4–10.5)
Chloride: 93 mEq/L — ABNORMAL LOW (ref 96–112)
Creatinine, Ser: 5.16 mg/dL (ref 0.40–1.50)
GFR: 9.63 mL/min — CL (ref >60.00)
Glucose, Bld: 170 mg/dL — ABNORMAL HIGH (ref 70–99)
Potassium: 4.8 mEq/L (ref 3.5–5.1)
Sodium: 133 mEq/L — ABNORMAL LOW (ref 135–145)
Total Bilirubin: 0.5 mg/dL (ref 0.2–1.2)
Total Protein: 7.2 g/dL (ref 6.0–8.3)

## 2023-03-27 MED ORDER — SODIUM CHLORIDE 0.9 % IV SOLN
500.0000 mg | Freq: Once | INTRAVENOUS | Status: AC
  Administered 2023-03-27: 500 mg via INTRAVENOUS
  Filled 2023-03-27: qty 500

## 2023-03-27 NOTE — Telephone Encounter (Signed)
Patient has know ESRD. Critical lab result - Creatinine 5.6 (improved from baseline); GFR 9.63

## 2023-03-27 NOTE — Progress Notes (Signed)
Diagnosis:   Infection due to ESBL-producing Escherichia coli     Provider:  Chilton Greathouse MD  Procedure: IV Infusion  IV Type: Peripheral, IV Location: R Antecubital  Ertapenem, Dose: 500 mg  Infusion Start Time: 1405  Infusion Stop Time: 1435  Post Infusion IV Care: Peripheral IV Discontinued  Discharge: Condition: Good, Destination: Home . AVS Declined  Performed by:  Loney Hering, LPN

## 2023-03-30 ENCOUNTER — Ambulatory Visit: Payer: Medicare Other

## 2023-03-30 VITALS — BP 108/66 | HR 75 | Temp 98.1°F | Resp 16 | Ht 68.0 in | Wt 161.6 lb

## 2023-03-30 DIAGNOSIS — K611 Rectal abscess: Secondary | ICD-10-CM

## 2023-03-30 DIAGNOSIS — Z1612 Extended spectrum beta lactamase (ESBL) resistance: Secondary | ICD-10-CM

## 2023-03-30 DIAGNOSIS — A498 Other bacterial infections of unspecified site: Secondary | ICD-10-CM

## 2023-03-30 MED ORDER — SODIUM CHLORIDE 0.9 % IV SOLN
500.0000 mg | Freq: Once | INTRAVENOUS | Status: AC
  Administered 2023-03-30: 500 mg via INTRAVENOUS
  Filled 2023-03-30: qty 500

## 2023-03-30 NOTE — Progress Notes (Signed)
Diagnosis: Infection due to ESBL-producing Escherichia coli   Provider:  Chilton Greathouse MD  Procedure: IV Infusion  IV Type: Peripheral, IV Location: R Upper Arm  Invanz (ertapenem), Dose: 500 mg  Infusion Start Time: 1431  Infusion Stop Time: 1508  Post Infusion IV Care: Peripheral IV Discontinued  Discharge: Condition: Good, Destination: Home . AVS Declined  Performed by:  Wyvonne Lenz, RN

## 2023-04-01 ENCOUNTER — Ambulatory Visit: Payer: Medicare Other

## 2023-04-01 VITALS — BP 108/66 | HR 105 | Temp 98.1°F | Resp 18 | Ht 68.0 in | Wt 160.4 lb

## 2023-04-01 DIAGNOSIS — K611 Rectal abscess: Secondary | ICD-10-CM | POA: Diagnosis not present

## 2023-04-01 DIAGNOSIS — Z1612 Extended spectrum beta lactamase (ESBL) resistance: Secondary | ICD-10-CM

## 2023-04-01 DIAGNOSIS — A498 Other bacterial infections of unspecified site: Secondary | ICD-10-CM | POA: Diagnosis not present

## 2023-04-01 MED ORDER — SODIUM CHLORIDE 0.9 % IV SOLN
500.0000 mg | Freq: Once | INTRAVENOUS | Status: AC
  Administered 2023-04-01: 500 mg via INTRAVENOUS
  Filled 2023-04-01: qty 500

## 2023-04-01 NOTE — Progress Notes (Signed)
Diagnosis: Infection due to ESBL-producing Escherichia coli   Provider:  Chilton Greathouse MD  Procedure: IV Infusion  IV Type: Peripheral, IV Location: R Antecubital  Invanz (ertapenem), Dose: 500 mg  Infusion Start Time: 1425  Infusion Stop Time: 1501  Post Infusion IV Care: Peripheral IV Discontinued  Discharge: Condition: Good, Destination: Home . AVS Declined  Performed by:  Wyvonne Lenz, RN

## 2023-04-02 ENCOUNTER — Telehealth: Payer: Self-pay

## 2023-04-02 NOTE — Telephone Encounter (Signed)
Called and spoke with [facility] at [facility] on 04/01/23. Relayed that no phlebotomist available on Friday this week (when patient is due for labs) or Monday next week, so will be unable to send out labwork. Joshua Riley took message to relay to [facility] clinical pharmacy team and patient's provider.  Joshua Riley called back this morning and stated, per patient's clinical team at [facility], that it would be fine to wait to draw patient's labs until next available opportunity on Wednesday, Oct 2.  Wyvonne Lenz, RN

## 2023-04-03 ENCOUNTER — Ambulatory Visit (INDEPENDENT_AMBULATORY_CARE_PROVIDER_SITE_OTHER): Payer: Medicare Other

## 2023-04-03 VITALS — BP 90/56 | HR 68 | Temp 98.8°F | Resp 20 | Ht 68.0 in | Wt 159.4 lb

## 2023-04-03 DIAGNOSIS — A498 Other bacterial infections of unspecified site: Secondary | ICD-10-CM

## 2023-04-03 DIAGNOSIS — K611 Rectal abscess: Secondary | ICD-10-CM | POA: Diagnosis not present

## 2023-04-03 DIAGNOSIS — Z1612 Extended spectrum beta lactamase (ESBL) resistance: Secondary | ICD-10-CM

## 2023-04-03 MED ORDER — SODIUM CHLORIDE 0.9 % IV SOLN
500.0000 mg | Freq: Once | INTRAVENOUS | Status: AC
  Administered 2023-04-03: 500 mg via INTRAVENOUS
  Filled 2023-04-03: qty 500

## 2023-04-03 NOTE — Progress Notes (Signed)
Diagnosis: Infection due to ESBL producing E. Coli  Provider:  Chilton Greathouse MD  Procedure: IV Infusion  IV Type: Peripheral, IV Location: R Forearm  Invanz, Dose: 500 mg  Infusion Start Time: 1418  Infusion Stop Time: 1449  Post Infusion IV Care: Peripheral IV Discontinued  Discharge: Condition: Good, Destination: Home . AVS Declined  Performed by:  Adriana Mccallum, RN

## 2023-04-06 ENCOUNTER — Ambulatory Visit (INDEPENDENT_AMBULATORY_CARE_PROVIDER_SITE_OTHER): Payer: Medicare Other

## 2023-04-06 VITALS — BP 94/57 | HR 72 | Temp 98.2°F | Resp 16 | Ht 68.0 in | Wt 158.4 lb

## 2023-04-06 DIAGNOSIS — Z1612 Extended spectrum beta lactamase (ESBL) resistance: Secondary | ICD-10-CM | POA: Diagnosis not present

## 2023-04-06 DIAGNOSIS — A498 Other bacterial infections of unspecified site: Secondary | ICD-10-CM | POA: Diagnosis not present

## 2023-04-06 DIAGNOSIS — K611 Rectal abscess: Secondary | ICD-10-CM

## 2023-04-06 MED ORDER — SODIUM CHLORIDE 0.9 % IV SOLN
500.0000 mg | Freq: Once | INTRAVENOUS | Status: AC
  Administered 2023-04-06: 500 mg via INTRAVENOUS
  Filled 2023-04-06: qty 500

## 2023-04-06 NOTE — Progress Notes (Signed)
Diagnosis: Infection due to ESBL producing E. Coli  Provider:  Chilton Greathouse MD  Procedure: IV Infusion  IV Type: Peripheral, IV Location: L Antecubital  Invanz, Dose: 500 mg  Infusion Start Time: 1434  Infusion Stop Time: 1505  Post Infusion IV Care: Peripheral IV Discontinued  Discharge: Condition: Good, Destination: Home . AVS Declined  Performed by:  Nat Math, RN

## 2023-04-08 ENCOUNTER — Encounter: Payer: Self-pay | Admitting: Internal Medicine

## 2023-04-08 ENCOUNTER — Other Ambulatory Visit (INDEPENDENT_AMBULATORY_CARE_PROVIDER_SITE_OTHER): Payer: Medicare Other

## 2023-04-08 ENCOUNTER — Ambulatory Visit (INDEPENDENT_AMBULATORY_CARE_PROVIDER_SITE_OTHER): Payer: Medicare Other | Admitting: Internal Medicine

## 2023-04-08 ENCOUNTER — Ambulatory Visit: Payer: Medicare Other

## 2023-04-08 VITALS — BP 107/59 | HR 67 | Temp 97.9°F | Resp 16 | Ht 68.0 in | Wt 157.2 lb

## 2023-04-08 VITALS — BP 113/69 | HR 68 | Temp 97.6°F | Ht 68.0 in | Wt 157.2 lb

## 2023-04-08 DIAGNOSIS — Z1612 Extended spectrum beta lactamase (ESBL) resistance: Secondary | ICD-10-CM

## 2023-04-08 DIAGNOSIS — D539 Nutritional anemia, unspecified: Secondary | ICD-10-CM | POA: Diagnosis not present

## 2023-04-08 DIAGNOSIS — A498 Other bacterial infections of unspecified site: Secondary | ICD-10-CM

## 2023-04-08 DIAGNOSIS — K611 Rectal abscess: Secondary | ICD-10-CM | POA: Diagnosis not present

## 2023-04-08 DIAGNOSIS — D649 Anemia, unspecified: Secondary | ICD-10-CM

## 2023-04-08 DIAGNOSIS — R0602 Shortness of breath: Secondary | ICD-10-CM | POA: Diagnosis not present

## 2023-04-08 DIAGNOSIS — N186 End stage renal disease: Secondary | ICD-10-CM | POA: Diagnosis not present

## 2023-04-08 DIAGNOSIS — F32A Depression, unspecified: Secondary | ICD-10-CM | POA: Insufficient documentation

## 2023-04-08 DIAGNOSIS — Z992 Dependence on renal dialysis: Secondary | ICD-10-CM

## 2023-04-08 LAB — COMPREHENSIVE METABOLIC PANEL
ALT: 16 U/L (ref 0–53)
AST: 23 U/L (ref 0–37)
Albumin: 4.2 g/dL (ref 3.5–5.2)
Alkaline Phosphatase: 80 U/L (ref 39–117)
BUN: 18 mg/dL (ref 6–23)
CO2: 32 meq/L (ref 19–32)
Calcium: 9.7 mg/dL (ref 8.4–10.5)
Chloride: 97 meq/L (ref 96–112)
Creatinine, Ser: 5.2 mg/dL (ref 0.40–1.50)
GFR: 9.54 mL/min — CL (ref >60.00)
Glucose, Bld: 74 mg/dL (ref 70–99)
Potassium: 4.6 meq/L (ref 3.5–5.1)
Sodium: 137 meq/L (ref 135–145)
Total Bilirubin: 0.5 mg/dL (ref 0.2–1.2)
Total Protein: 7.3 g/dL (ref 6.0–8.3)

## 2023-04-08 LAB — CBC WITH DIFFERENTIAL/PLATELET
Basophils Absolute: 0.1 10*3/uL (ref 0.0–0.1)
Basophils Relative: 0.6 % (ref 0.0–3.0)
Eosinophils Absolute: 0.4 10*3/uL (ref 0.0–0.7)
Eosinophils Relative: 3.2 % (ref 0.0–5.0)
HCT: 32.2 % — ABNORMAL LOW (ref 39.0–52.0)
Hemoglobin: 10.4 g/dL — ABNORMAL LOW (ref 13.0–17.0)
Lymphocytes Relative: 9.4 % — ABNORMAL LOW (ref 12.0–46.0)
Lymphs Abs: 1.2 10*3/uL (ref 0.7–4.0)
MCHC: 32.3 g/dL (ref 30.0–36.0)
MCV: 122.3 fL — ABNORMAL HIGH (ref 78.0–100.0)
Monocytes Absolute: 1.3 10*3/uL — ABNORMAL HIGH (ref 0.1–1.0)
Monocytes Relative: 10.2 % (ref 3.0–12.0)
Neutro Abs: 10 10*3/uL — ABNORMAL HIGH (ref 1.4–7.7)
Neutrophils Relative %: 76.6 % (ref 43.0–77.0)
Platelets: 248 10*3/uL (ref 150.0–400.0)
RBC: 2.64 Mil/uL — ABNORMAL LOW (ref 4.22–5.81)
RDW: 15.8 % — ABNORMAL HIGH (ref 11.5–15.5)
WBC: 13.1 10*3/uL — ABNORMAL HIGH (ref 4.0–10.5)

## 2023-04-08 MED ORDER — B-12 1000 MCG SL SUBL
1.0000 | SUBLINGUAL_TABLET | Freq: Every day | SUBLINGUAL | 3 refills | Status: DC
Start: 2023-04-08 — End: 2023-09-07

## 2023-04-08 MED ORDER — SODIUM CHLORIDE 0.9 % IV SOLN
500.0000 mg | Freq: Once | INTRAVENOUS | Status: AC
  Administered 2023-04-08: 500 mg via INTRAVENOUS
  Filled 2023-04-08: qty 500

## 2023-04-08 MED ORDER — SERTRALINE HCL 25 MG PO TABS
25.0000 mg | ORAL_TABLET | Freq: Every day | ORAL | 3 refills | Status: DC
Start: 2023-04-08 — End: 2023-07-21

## 2023-04-08 NOTE — Assessment & Plan Note (Signed)
He is experiencing depression and lethargy, impacting his overall health status. We will start Zoloft, instructing him to take it at the same time every day, and consider a referral to behavioral health for counseling.   I recommended against valium due to memory concerns although he had positive experience with trial from a friend.   I explained how we would have to wait for Zoloft to get working.

## 2023-04-08 NOTE — Patient Instructions (Addendum)
VISIT SUMMARY:  During your recent visit, we discussed your ongoing health issues, including a COVID infection around march 2024, shortness of breath, anemia, depression, and your dialysis treatment. We also discussed your general health maintenance. We have outlined a plan to manage each of these issues and improve your overall health.  YOUR PLAN:  -SHORTNESS OF BREATH: You've been experiencing difficulty breathing, especially after physical activity. We will order a chest X-ray to check for any lung damage and send you to pulmonology urgently,  and will also check your thyroid levels, as they can sometimes be related to breathing difficulties.  We will do other blood investigations for other causes of shortness of breath  -ANEMIA: Your hemoglobin levels have dropped over the past eight months, which can make you feel tired and short of breath. We will check your iron and B12 levels to make sure your body has what it needs to make red blood cells.  -DEPRESSION: You've been feeling down and lacking energy, which can be signs of depression. We will start you on a medication called Zoloft, which should be taken at the same time every day. We may also refer you to a counselor for additional support, if you change your mind about that.  -DIALYSIS: You are on dialysis due to kidney disease. This treatment will continue as scheduled.  -GENERAL HEALTH MAINTENANCE:  We will also continue to monitor your blood counts and check for any signs of gastrointestinal bleeding if you notice any changes in your stool.  INSTRUCTIONS:  Please continue to take your medications as prescribed. Remember to take your new medication, Zoloft, at the same time every day. If you notice any changes in your stool, please let us know. We will be scheduling a chest X-ray and blood tests to check your thyroid, iron, and B12 levels. If you have any questions or concerns, don't hesitate to contact us.

## 2023-04-08 NOTE — Progress Notes (Signed)
[facility]: [phone]   -- Medical Office Visit --  Patient:  Joshua Riley      Age: 87 y.o.       Sex:  male  Date:   04/08/2023 Patient Care Team: Shelva Majestic, MD as PCP - General (Family Medicine) Corky Crafts, MD as PCP - Cardiology (Cardiology) Center, [facility]  PCP: Shelva Majestic, MD  Today's Healthcare Provider: Lula Olszewski, MD      Assessment & Plan Shortness of breath He has experienced shortness of breath for the past seven months, post-COVID, with exertion exacerbating the condition. Cardiac causes have been excluded. We will order a chest X-ray to evaluate for possible lung scarring or other pulmonary causes and check thyroid levels due to a potential connection with shortness of breath, memory issues, and large red blood cells. Depressive disorder He is experiencing depression and lethargy, impacting his overall health status. We will start Zoloft, instructing him to take it at the same time every day, and consider a referral to behavioral health for counseling.   I recommended against valium due to memory concerns although he had positive experience with trial from a friend.   I explained how we would have to wait for Zoloft to get working. Anemia, unspecified type His hemoglobin has decreased from 11.6 to 10 over the past eight months, potentially contributing to the shortness of breath. We will check iron and B12 levels to ensure adequate resources for blood cell production. Chronic kidney disease requiring chronic dialysis [facility]) He is on dialysis, which may be affecting his overall health status and symptoms. Dialysis will continue as scheduled. Macrocytic anemia  General Health Maintenance: advised checking for GI bleeding from ostomy if any changes in stool are noted.      Diagnoses and all orders for this visit: Shortness of breath -     Ambulatory referral to Pulmonology -     TSH + free  T4 -     Cyanocobalamin (B-12) 1000 MCG SUBL; Place 1 tablet under the tongue daily at 6 (six) AM. -     DG Chest 2 View; Future -     Cardio IQ NT ProBNP -     Troponin I -     Reticulocytes; Future -     IBC + Ferritin Depressive disorder -     sertraline (ZOLOFT) 25 MG tablet; Take 1 tablet (25 mg total) by mouth daily. Anemia, unspecified type -     TSH + free T4 -     Reticulocytes; Future -     IBC + Ferritin Chronic kidney disease requiring chronic dialysis (HCC) Macrocytic anemia -     TSH + free T4 -     Cyanocobalamin (B-12) 1000 MCG SUBL; Place 1 tablet under the tongue daily at 6 (six) AM. -     DG Chest 2 View; Future -     Cardio IQ NT ProBNP -     Troponin I -     Reticulocytes; Future -     IBC + Ferritin -     sertraline (ZOLOFT) 25 MG tablet; Take 1 tablet (25 mg total) by mouth daily.   Future Appointments  Date Time Provider Department Center  04/08/2023  3:20 PM [facility] None  04/10/2023  2:15 PM [facility]-CHAIR 4 [facility] None  06/05/2023  9:00 AM [facility] ANNUAL WELLNESS VISIT 1 [facility] PEC  08/13/2023  8:20 AM Hunter, Aldine Contes, MD [facility] PEC  Subjective   87 y.o. 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 His reasons/main concerns/chief complaints for today's office visit are Shortness of Breath (For a few months. When walking or getting out of the car.), Anxiety, and Depression    ------------------------------------------------------------------------------------------------------------------------ AI-Extracted: Discussed the use of AI scribe software for clinical note transcription with the patient, who gave verbal consent to proceed.  History of Present Illness   The patient, with a history of dialysis-dependent kidney disease, colostomy, and recent COVID-19 infection, presents with a complex medical picture. He reports a recent hospitalization for a lung infection, which required drainage via a tube inserted into his back. The infection was initially treated with an incorrect antibiotic, necessitating a course of intravenous antibiotics for at least eleven days. The patient tested positive for E. Coli and Streptococcus, but the specific strain and antibiotic sensitivities are unknown.  The patient also reports significant shortness of breath, which has been a persistent issue since his COVID-19 infection. He describes difficulty climbing half a staircase and needing to walk slowly to avoid exacerbating his dyspnea. He denies any associated chest pain or leg cramps. The patient is also on dialysis, which he reports sometimes leads to leg cramps if too much fluid is removed.  In addition to these physical symptoms, the patient reports feelings of depression and anxiety, particularly at night. He describes a lack of energy and increased forgetfulness, which he believes may be related to his ongoing health issues. He has not previously been treated for depression and has not engaged in any behavioral health counseling.  The patient's recent labs show a drop in hemoglobin from 11.6 to 10 over the past eight months, which may be contributing to his shortness of breath. He also has unusually large red blood cells, which may suggest a vitamin deficiency or thyroid issue. The patient reports taking B12 supplements twice a day. He has not had his thyroid checked in several years.  The  patient also reports occasional alcohol consumption, specifically martinis when dining out. He denies any at-home alcohol consumption. He has a colostomy and reports no blood in his stool. He also reports no recent changes to his medication regimen.      He has a past medical history of A-fib (HCC), Anemia, Arthritis, Cancer (HCC), COVID-19, Dyspnea, Dysrhythmia, ESRD (end stage renal disease) (HCC) (10/22/2021), Glaucoma (11/18/2021), History of DVT (deep vein thrombosis), Hydronephrosis, Idiopathic neuropathy (10/22/2021), Ileostomy in place [facility]), Obstructive uropathy, Old retinal detachment, total or subtotal, Orthostatic hypotension (10/22/2021), Sleep apnea, Stroke (HCC), Ulcerative colitis (HCC), and Ureteral stricture.  Problem list overviews that were updated at today's visit: Problem  Macrocytic Anemia  Depressive Disorder   Current Outpatient Medications on File Prior to Visit  Medication Sig   azelastine (ASTELIN) 0.1 % nasal spray Place 2 sprays into both nostrils 2 (two) times daily.   calcitRIOL (ROCALTROL) 0.25 MCG capsule Take 1 mcg by mouth daily.   cyanocobalamin (VITAMIN B12) 1000 MCG tablet Take 1,000 mcg by mouth daily.   ELIQUIS 2.5 MG TABS tablet Take 1 tablet (2.5 mg total) by mouth 2 (two) times daily.   famotidine (PEPCID AC) 10 MG tablet Take after dialysis on MWF -  let us know if symptoms do not improve   finasteride (PROSCAR) 5 MG tablet Take 5 mg by mouth daily.   gabapentin (NEURONTIN) 100 MG capsule Take 1 capsule by mouth daily.   ibuprofen (ADVIL) 800 MG tablet Take 800 mg by mouth 4 (four) times daily as needed.   latanoprost (XALATAN) 0.005 % ophthalmic solution Place 1 drop into the right eye at bedtime.   mesalamine (LIALDA) 1.2 g EC tablet Take 2.4 g by mouth daily.   midodrine (PROAMATINE) 10 MG tablet Take 1 tablet (10 mg total) by mouth 3 (three) times daily. Takes 5 mg plus 10mg  for 15 mg total three times a day- started by nephrology in [place]    midodrine (PROAMATINE) 5 MG tablet TAKE 1 TABLET 3 TIMES DAILY WITH MEALS. TAKES 5 MG PLUS 10MG  FOR 15 MG TOTAL THREE TIMES A DAY- STARTED BY NEPHROLOGY IN [place]   nystatin (MYCOSTATIN/NYSTOP) powder Apply 1 Application topically 3 (three) times daily.   Omega 3 1000 MG CAPS Take 1,000 mg by mouth daily.   pregabalin (LYRICA) 50 MG capsule Take 1 capsule (50 mg total) by mouth daily.   propranolol (INDERAL) 10 MG tablet Take 1 tablet by mouth daily.   sevelamer carbonate (RENVELA) 800 MG tablet Take 2,400 mg by mouth 3 (three) times daily with meals.   sodium zirconium cyclosilicate (LOKELMA) 10 g PACK packet Take 10 g by mouth 4 (four) times a week. Non Dialysis days- Tues, Thurs, Saturday, Sunday   temazepam (RESTORIL) 15 MG capsule Take 15 mg by mouth at bedtime as needed.   timolol (TIMOPTIC) 0.5 % ophthalmic solution Place 1 drop into both eyes daily.   traMADol (ULTRAM) 50 MG tablet Take 50 mg by mouth every 8 (eight) hours as needed (prn pain).   trimethoprim-polymyxin b (POLYTRIM) ophthalmic solution Place 1 drop into both eyes every 4 (four) hours.   zolpidem (AMBIEN) 5 MG tablet Take 5 mg by mouth at bedtime as needed for sleep.   No current facility-administered medications on file prior to visit.  There are no discontinued medications.   Objective   Physical Exam  BP 113/69 (BP Location: Left Arm, Patient Position: Sitting)   Pulse 68   Temp 97.6 F (36.4 C) (Temporal)   Ht 5\' 8"  (1.727 m)   Wt 157 lb 3.2 oz (71.3 kg)   SpO2 100%   BMI 23.90 kg/m  Wt Readings from Last 10 Encounters:  04/08/23 157 lb 3.2 oz (71.3 kg)  04/08/23 157 lb 3.2 oz (71.3 kg)  04/06/23 158 lb 6.4 oz (71.8 kg)  04/03/23 159 lb 6.4 oz (72.3 kg)  04/01/23 160 lb 6.4 oz (72.8 kg)  03/30/23 161 lb 9.6 oz (73.3 kg)  03/27/23 162 lb (73.5 kg)  03/25/23 163 lb 6.4 oz (74.1 kg)  03/23/23 162 lb 9.6 oz (73.8 kg)  03/04/23 166 lb 0.1 oz (75.3 kg)   Vital signs reviewed.  Nursing notes reviewed.  Weight trend reviewed. Abnormalities and Problem-Specific physical exam findings:  ostomy bag on abdomen and left legs "I'm a 2-bagger" funny gentleman. Memory issues not obvious though daughter concerned.  Breathing problems are not active at rest, normal work of breathing-he is able to slowly walk to the checkout counter without getting winded and auscultation reveals faint traces wheezing diffusely with good air movements otherwise clear to auscultation bilaterally.   General Appearance:  No acute distress appreciable.   Well-groomed, healthy-appearing male.  Well proportioned with no abnormal fat distribution.  Good  muscle tone. Pulmonary:  Normal work of breathing at rest, no respiratory distress apparent. SpO2: 100 %  Musculoskeletal: All extremities are intact.  Neurological:  Awake, alert, oriented, and engaged.  No obvious focal neurological deficits or cognitive impairments.  Sensorium seems unclouded.   Speech is clear and coherent with logical content. Psychiatric:  Appropriate mood, pleasant and cooperative demeanor, thoughtful and engaged during the exam  Results   LABS Hb: 10 g/dL (16/04/9603) Hb: 9 g/dL (54/03/8118) MCV: elevated WBC: elevated E. coli: positive Streptococcus: positive        No results found for any visits on 04/08/23.  Lab on 03/27/2023  Component Date Value   Sodium 03/27/2023 133 (L)    Potassium 03/27/2023 4.8 Hemolysis seen...    Chloride 03/27/2023 93 (L)    CO2 03/27/2023 28    Glucose, Bld 03/27/2023 170 (H)    BUN 03/27/2023 25 (H)    Creatinine, Ser 03/27/2023 5.16 (HH)    Total Bilirubin 03/27/2023 0.5    Alkaline Phosphatase 03/27/2023 78    AST 03/27/2023 40 (H)    ALT 03/27/2023 32    Total Protein 03/27/2023 7.2    Albumin 03/27/2023 3.9    GFR 03/27/2023 9.63 (LL)    Calcium 03/27/2023 9.6    WBC 03/27/2023 10.5    RBC 03/27/2023 2.52 (L)    Hemoglobin 03/27/2023 10.1 (L)    HCT 03/27/2023 30.6 (L)    MCV 03/27/2023 121.6 (H)     MCHC 03/27/2023 33.1    RDW 03/27/2023 16.8 (H)    Platelets 03/27/2023 196.0    Neutrophils Relative % 03/27/2023 80.1 (H)    Lymphocytes Relative 03/27/2023 7.9 (L)    Monocytes Relative 03/27/2023 8.6    Eosinophils Relative 03/27/2023 2.9    Basophils Relative 03/27/2023 0.5    Neutro Abs 03/27/2023 8.4 (H)    Lymphs Abs 03/27/2023 0.8    Monocytes Absolute 03/27/2023 0.9    Eosinophils Absolute 03/27/2023 0.3    Basophils Absolute 03/27/2023 0.1   Office Visit on 11/11/2022  Component Date Value   SARS Coronavirus 2 Ag 11/11/2022 Negative    Influenza A, POC 11/11/2022 Negative    Influenza B, POC 11/11/2022 Negative   Office Visit on 10/07/2022  Component Date Value   WBC 10/07/2022 8.9    RBC 10/07/2022 2.66 (L)    Platelets 10/07/2022 175.0    Hemoglobin 10/07/2022 10.4 (L)    HCT 10/07/2022 31.3 (L)    MCV 10/07/2022 117.6 Repeated and verified X2. (H)    MCHC 10/07/2022 33.3    RDW 10/07/2022 16.5 (H)    Ferritin 10/07/2022 881.4 (H)   Admission on 09/27/2022, Discharged on 09/27/2022  Component Date Value   WBC 09/27/2022 9.6    RBC 09/27/2022 2.50 (L)    Hemoglobin 09/27/2022 9.7 (L)    HCT 09/27/2022 28.6 (L)    MCV 09/27/2022 114.4 (H)    MCH 09/27/2022 38.8 (H)    MCHC 09/27/2022 33.9    RDW 09/27/2022 15.2    Platelets 09/27/2022 137 (L)    nRBC 09/27/2022 0.0    Neutrophils Relative % 09/27/2022 72    Neutro Abs 09/27/2022 6.9    Lymphocytes Relative 09/27/2022 12    Lymphs Abs 09/27/2022 1.1    Monocytes Relative 09/27/2022 13    Monocytes Absolute 09/27/2022 1.3 (H)    Eosinophils Relative 09/27/2022 2    Eosinophils Absolute 09/27/2022 0.2    Basophils Relative 09/27/2022 0    Basophils Absolute 09/27/2022 0.0  WBC Morphology 09/27/2022 MORPHOLOGY UNREMARKABLE    Smear Review 09/27/2022 Normal platelet morphology    Immature Granulocytes 09/27/2022 1    Abs Immature Granulocytes 09/27/2022 0.05    Polychromasia 09/27/2022 PRESENT     Fecal Occult Bld 09/27/2022 POSITIVE (A)   Office Visit on 08/21/2022  Component Date Value   SARS Coronavirus 2 Ag 08/21/2022 Negative    Influenza A, POC 08/21/2022 Negative    Influenza B, POC 08/21/2022 Negative   Lab on 07/17/2022  Component Date Value   Glucose 07/17/2022 69 (L)    BUN 07/17/2022 37 (H)    Creatinine, Ser 07/17/2022 8.65 (H)    eGFR 07/17/2022 6 (L)    BUN/Creatinine Ratio 07/17/2022 4 (L)    Sodium 07/17/2022 139    Potassium 07/17/2022 4.9    Chloride 07/17/2022 96    CO2 07/17/2022 29    Calcium 07/17/2022 10.2    WBC 07/17/2022 8.1    RBC 07/17/2022 3.05 (L)    Hemoglobin 07/17/2022 11.6 (L)    Hematocrit 07/17/2022 34.3 (L)    MCV 07/17/2022 113 (H)    MCH 07/17/2022 38.0 (H)    MCHC 07/17/2022 33.8    RDW 07/17/2022 14.3    Platelets 07/17/2022 144 (L)   No results displayed because visit has over 200 results.    Appointment on 05/27/2022  Component Date Value   Area-P 1/2 05/27/2022 3.40    S' Lateral 05/27/2022 2.60    P 1/2 time 05/27/2022 616   Admission on 05/06/2022, Discharged on 05/07/2022  Component Date Value   Sodium 05/06/2022 140    Potassium 05/06/2022 4.9    Chloride 05/06/2022 104    BUN 05/06/2022 46 (H)    Creatinine, Ser 05/06/2022 9.60 (H)    Glucose, Bld 05/06/2022 88    Calcium, Ion 05/06/2022 1.21    TCO2 05/06/2022 25    Hemoglobin 05/06/2022 9.5 (L)    HCT 05/06/2022 28.0 (L)    WBC 05/06/2022 6.0    RBC 05/06/2022 2.18 (L)    Hemoglobin 05/06/2022 8.8 (L)    HCT 05/06/2022 27.1 (L)    MCV 05/06/2022 124.3 (H)    MCH 05/06/2022 40.4 (H)    MCHC 05/06/2022 32.5    RDW 05/06/2022 15.0    Platelets 05/06/2022 108 (L)    nRBC 05/06/2022 0.0    Creatinine, Ser 05/06/2022 9.19 (H)    GFR, Estimated 05/06/2022 5 (L)    No image results found.   IR NEPHROSTOMY EXCHANGE LEFT  Result Date: 02/05/2023 INDICATION: 87 year old male presents for routine exchange of left percutaneous nephrostomy EXAM: IR EXCHANGE  NEPHROSTOMY LEFT COMPARISON:  10/28/2022 MEDICATIONS: None ANESTHESIA/SEDATION: None CONTRAST:  10mL OMNIPAQUE IOHEXOL 300 MG/ML SOLN - administered into the collecting system(s) FLUOROSCOPY TIME:  Fluoroscopy Time:  (7 mGy). COMPLICATIONS: None PROCEDURE: Informed written consent was obtained from the patient after a thorough discussion of the procedural risks, benefits and alternatives. All questions were addressed. Maximal Sterile Barrier Technique was utilized including caps, mask, sterile gowns, sterile gloves, sterile drape, hand hygiene and skin antiseptic. A timeout was performed prior to the initiation of the procedure. The patient was positioned in the operation suite in the prone position on fluoroscopy table. The left flank and the indwelling tube were prepped and draped in the usual sterile fashion, and a sterile drape was placed. Scout image was obtained. Contrast was infused through the indwelling catheter, opacifying the collecting system. The catheter was then ligated, and an 035 wire was advanced to  the collecting system. The catheter was then removed over the wire. A new, 8.5 French percutaneous nephrostomy tube was advanced over the wire, with the radial opaque marker positioned at the collecting system. The wire and inner dilator/stiffener were removed, and the pigtail catheter was formed in the collecting system. Contrast was infused through the catheter confirming position. A final image was stored. The patient tolerated the procedure well and remained hemodynamically stable throughout. No complications were encountered and no significant blood loss was encountered. IMPRESSION: Status post routine exchange of left percutaneous nephrostomy. Signed, Yvone Neu. Miachel Roux, RPVI [facility] Electronically Signed   By: Gilmer Mor D.O.   On: 02/05/2023 10:02    No results found.    Additional Info: This encounter employed  real-time, collaborative documentation. The patient actively reviewed and updated their medical record on a shared screen, ensuring transparency and facilitating joint problem-solving for the problem list, overview, and plan. This approach promotes accurate, informed care. The treatment plan was discussed and reviewed in detail, including medication safety, potential side effects, and all patient questions. We confirmed understanding and comfort with the plan. Follow-up instructions were established, including contacting the office for any concerns, returning if symptoms worsen, persist, or new symptoms develop, and precautions for potential emergency department visits.

## 2023-04-08 NOTE — Progress Notes (Signed)
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 AVS Provided  Performed by:  Rico Ala, LPN

## 2023-04-08 NOTE — Patient Instructions (Signed)
Ertapenem Injection What is this medication? ERTAPENEM (er ta PEN em) treats infections caused by bacteria. It will not treat colds, the flu, or infections caused by viruses. This medicine may be used for other purposes; ask your health care provider or pharmacist if you have questions. COMMON BRAND NAME(S): Invanz What should I tell my care team before I take this medication? They need to know if you have any of these conditions: Brain tumor Kidney disease Seizures Stomach or intestine problems, such as colitis An unusual or allergic reaction to ertapenem, other penicillin or cephalosporin antibiotics, other medications, foods, dyes, or preservatives Pregnant or trying to get pregnant Breast-feeding How should I use this medication? This medication is injected into a muscle or a vein. It is usually given you care team in a hospital or clinic setting. If you get this medication at home, you will be taught how to prepare and give it. Use exactly as directed. Take it as directed on the prescription label at the same time every day. Keep taking it unless your care team tells you to stop. It is important that you put your used needles and syringes in a special sharps container. Do not put them in a trash can. If you do not have a sharps container, call your pharmacist or care team to get one. Talk to your care team about the use of this medication in children. While it may be prescribed for children as young as 3 months for selected conditions, precautions do apply. Overdosage: If you think you have taken too much of this medicine contact a poison control center or emergency room at once. NOTE: This medicine is only for you. Do not share this medicine with others. What if I miss a dose? If you get this medication at the hospital or clinic: It is important not to miss your dose. Call your care team if you are unable to keep an appointment. If you give yourself this medication at home: If you miss a  dose, take it as soon as you can. If it is almost time for your next dose, take only that dose. Do not take double or extra doses. Call your care team with questions. What may interact with this medication? Estrogen and progestin hormones Probenecid Valproic acid, divalproex sodium This list may not describe all possible interactions. Give your health care provider a list of all the medicines, herbs, non-prescription drugs, or dietary supplements you use. Also tell them if you smoke, drink alcohol, or use illegal drugs. Some items may interact with your medicine. What should I watch for while using this medication? Your condition will be monitored carefully while you are receiving this medication. Tell your care team if your symptoms do not start to get better or if they get worse. Do not treat diarrhea with over the counter products. Contact your care team if you have diarrhea that lasts more than 2 days or if it is severe and watery. What side effects may I notice from receiving this medication? Side effects that you should report to your care team as soon as possible: Allergic reactions--skin rash, itching, hives, swelling of the face, lips, tongue, or throat Seizures Severe diarrhea, fever Unusual vaginal discharge, itching, or odor Side effects that usually do not require medical attention (report to your care team if they continue or are bothersome): Headache Nausea Pain, redness, or irritation at injection site Vomiting This list may not describe all possible side effects. Call your doctor for medical advice about  side effects. You may report side effects to FDA at 1-800-FDA-1088. Where should I keep my medication? Keep out of the reach of children and pets. You will be instructed on how to store this medication. Get rid of any unused medication after the expiration date. To get rid of medications that are no longer needed or have expired: Take the medication to a medication take-back  program. Check with your pharmacy or law enforcement to find a location. If you cannot return the medication, ask your pharmacist or care team how to get rid of this medication safely. NOTE: This sheet is a summary. It may not cover all possible information. If you have questions about this medicine, talk to your doctor, pharmacist, or health care provider.  2024 Elsevier/Gold Standard (2022-11-21 00:00:00)

## 2023-04-10 ENCOUNTER — Other Ambulatory Visit: Payer: Medicare Other

## 2023-04-10 ENCOUNTER — Ambulatory Visit
Admission: RE | Admit: 2023-04-10 | Discharge: 2023-04-10 | Disposition: A | Discharge status: Home / Self Care | Payer: Medicare Other | Source: Ambulatory Visit | Attending: Internal Medicine

## 2023-04-10 ENCOUNTER — Ambulatory Visit: Payer: Medicare Other

## 2023-04-10 VITALS — BP 99/63 | HR 65 | Temp 98.2°F | Resp 16 | Ht 68.0 in | Wt 157.6 lb

## 2023-04-10 DIAGNOSIS — K611 Rectal abscess: Secondary | ICD-10-CM

## 2023-04-10 DIAGNOSIS — Z1612 Extended spectrum beta lactamase (ESBL) resistance: Secondary | ICD-10-CM

## 2023-04-10 DIAGNOSIS — A498 Other bacterial infections of unspecified site: Secondary | ICD-10-CM

## 2023-04-10 DIAGNOSIS — R0602 Shortness of breath: Secondary | ICD-10-CM | POA: Diagnosis not present

## 2023-04-10 MED ORDER — SODIUM CHLORIDE 0.9 % IV SOLN
500.0000 mg | Freq: Once | INTRAVENOUS | Status: AC
  Administered 2023-04-10: 500 mg via INTRAVENOUS
  Filled 2023-04-10: qty 500

## 2023-04-10 NOTE — Progress Notes (Signed)
Diagnosis: Infection due to ESBL producing E. Coli  Provider:  Chilton Greathouse MD  Procedure: IV Infusion  IV Type: Peripheral, IV Location: L Forearm  Invanz, Dose: 500 mg  Infusion Start Time: 1422  Infusion Stop Time: 1457  Post Infusion IV Care: Peripheral IV Discontinued  Discharge: Condition: Good, Destination: Home . AVS Declined  Performed by:  Adriana Mccallum, RN

## 2023-04-11 LAB — BASIC METABOLIC PANEL
BUN/Creatinine Ratio: 4 — ABNORMAL LOW (ref 10–24)
BUN: 19 mg/dL (ref 8–27)
CO2: 29 mmol/L (ref 20–29)
Calcium: 10.4 mg/dL — ABNORMAL HIGH (ref 8.6–10.2)
Chloride: 93 mmol/L — ABNORMAL LOW (ref 96–106)
Creatinine, Ser: 5.16 mg/dL — ABNORMAL HIGH (ref 0.76–1.27)
Glucose: 98 mg/dL (ref 70–99)
Potassium: 4.7 mmol/L (ref 3.5–5.2)
Sodium: 138 mmol/L (ref 134–144)
eGFR: 10 mL/min/{1.73_m2} — ABNORMAL LOW (ref >59)

## 2023-04-11 LAB — CBC WITH DIFFERENTIAL/PLATELET
Basophils Absolute: 0.1 10*3/uL (ref 0.0–0.2)
Basos: 0 %
EOS (ABSOLUTE): 0.3 10*3/uL (ref 0.0–0.4)
Eos: 3 %
Hematocrit: 33.2 % — ABNORMAL LOW (ref 37.5–51.0)
Hemoglobin: 10.9 g/dL — ABNORMAL LOW (ref 13.0–17.7)
Immature Grans (Abs): 0.1 10*3/uL (ref 0.0–0.1)
Immature Granulocytes: 1 %
Lymphocytes Absolute: 0.8 10*3/uL (ref 0.7–3.1)
Lymphs: 7 %
MCH: 39.2 pg — ABNORMAL HIGH (ref 26.6–33.0)
MCHC: 32.8 g/dL (ref 31.5–35.7)
MCV: 119 fL — ABNORMAL HIGH (ref 79–97)
Monocytes Absolute: 1.1 10*3/uL — ABNORMAL HIGH (ref 0.1–0.9)
Monocytes: 9 %
Neutrophils Absolute: 9.4 10*3/uL — ABNORMAL HIGH (ref 1.4–7.0)
Neutrophils: 80 %
Platelets: 207 10*3/uL (ref 150–450)
RBC: 2.78 x10E6/uL — ABNORMAL LOW (ref 4.14–5.80)
RDW: 13.2 % (ref 11.6–15.4)
WBC: 11.8 10*3/uL — ABNORMAL HIGH (ref 3.4–10.8)

## 2023-04-11 LAB — RETICULOCYTES
ABS Retic: 71500 {cells}/uL (ref 25000–90000)
Retic Ct Pct: 2.6 %

## 2023-04-14 ENCOUNTER — Ambulatory Visit: Payer: Medicare Other | Admitting: Internal Medicine

## 2023-04-14 ENCOUNTER — Encounter: Payer: Self-pay | Admitting: Internal Medicine

## 2023-04-14 VITALS — BP 121/70 | HR 76 | Ht 67.0 in | Wt 158.6 lb

## 2023-04-14 DIAGNOSIS — Z8616 Personal history of COVID-19: Secondary | ICD-10-CM

## 2023-04-14 DIAGNOSIS — Z992 Dependence on renal dialysis: Secondary | ICD-10-CM

## 2023-04-14 DIAGNOSIS — N186 End stage renal disease: Secondary | ICD-10-CM

## 2023-04-14 DIAGNOSIS — Z862 Personal history of diseases of the blood and blood-forming organs and certain disorders involving the immune mechanism: Secondary | ICD-10-CM

## 2023-04-14 DIAGNOSIS — R0609 Other forms of dyspnea: Secondary | ICD-10-CM

## 2023-04-14 DIAGNOSIS — Z87891 Personal history of nicotine dependence: Secondary | ICD-10-CM

## 2023-04-14 DIAGNOSIS — I5189 Other ill-defined heart diseases: Secondary | ICD-10-CM

## 2023-04-14 NOTE — Patient Instructions (Addendum)
ICD-10-CM   1. DOE (dyspnea on exertion)  R06.09     2. History of anemia  Z86.2     3. History of 2019 novel coronavirus disease (COVID-19)  Z86.16     4. ESRD (end stage renal disease) on dialysis (HCC)  N18.6    Z99.2     5. Grade I diastolic dysfunction  I51.89     6. Former smoker  Z87.891      Shortness of breath can be multifactorial and this could be associated with loss of physical conditioning, anemia, stiff heart muscle [you already had it a year ago but at a mild level] and possible post COVID or COVID unrelated pulmonary fibrosis or even smoking-related emphysema  Plan - Do high-resolution CT chest supine and prone - Do full pulmonary function test -Hold off on echocardiogram [most recent 07 May 2022]  Follow-up - Return to see nurse practitioner or Dr. Marchelle Riley in the next 4-6 weeks [video visit is fine to discuss test results]

## 2023-04-14 NOTE — Progress Notes (Signed)
OV 04/14/2023  Subjective:  Patient ID: Joshua Riley, male , DOB: 03/21/1936 , age 87 y.o. , MRN: [id number] , ADDRESS: [address] [place] [address] PCP Shelva Majestic, MD Patient Care Team: Shelva Majestic, MD as PCP - General (Family Medicine) Corky Crafts, MD as PCP - Cardiology (Cardiology) Center, [facility]  This Provider for this visit: Treatment Team:  Attending Provider: Kalman Shan, MD    04/14/2023 -   Chief Complaint  Patient presents with   Consult    Pt started experiencing sob after having covid 2 years ago. No inhaler usage. No oxygen      HPI Joshua Riley 87 y.o. -presents with his daughter [name].  She is an independent historian here today.  She works at the friend [employer] on [employer].  She has lived in [place] for 30 years.  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  And then 2 years ago his significant other [name] passed away and then he relocated to [place] to live next to his daughter.  He has a history of colostomy since 2012.  He has a history of nephrostomy tubes somewhere along the way.  He has been on dialysis for the last 2 years.  He makes some amount of urine.  He tells me that he had COVID 2 or 3 years ago and was hospitalized in [facility] was on oxygen.  And then he got better but now for the last 4 months he has had shortness of breath on exertion relieved by rest.  He has to walk slowly at [facility].  When he takes a shower he gets winded but not for changing clothes.  Sometimes getting out of the car makes him short of breath.  He does not think he can do 1 flight of stairs.  There is no chest pain no cough or wheezing.  Review of the labs indicate anemia  Echocardiogram #2023 grade 1 diastolic dysfunction  CT  abdomen lung image May 2023: Left lower lobe atelectasis personally visualized.  And agree with final report  Chest x-ray 04/10/2023: Personally visualized official report is pending.  Looks clear to me.  Walking desaturation test 04/14/2023; 200 feet in office -> did not desaturate    Latest Reference Range & Units 11/17/21 22:11 11/19/21 00:44 05/06/22 06:21 05/06/22 16:04 06/02/22 12:15 06/03/22 05:00 06/04/22 08:44 06/05/22 05:01 06/06/22 07:56 07/17/22 10:34 09/27/22 10:28 10/07/22 12:32 03/27/23 14:30 04/08/23 15:03 04/10/23 15:41  Hemoglobin 13.0 - 17.7 g/dL 08.6 (L) 9.6 (L) 9.5 (L) 8.8 (L) 8.9 (L) 8.1 (L) 8.2 (L) 8.0 (L) 8.2 (L) 11.6 (L) 9.7 (L) 10.4 (L) 10.1 (L) 10.4 (L) 10.9 (L)  (L): Data is abnormally low  PFT      No data to display            LAB RESULTS last 96 hours No results found.  LAB RESULTS last 90 days Recent Results (from the past 2160 hour(s))  Comp Met (CMET)     Status: Abnormal   Collection Time: 03/27/23  2:30 PM  Result Value Ref Range   Sodium 133 (L) 135 - 145 mEq/L   Potassium 4.8 Hemolysis seen... 3.5 - 5.1 mEq/L   Chloride 93 (L) 96 - 112 mEq/L   CO2 28 19 -  32 mEq/L   Glucose, Bld 170 (H) 70 - 99 mg/dL   BUN 25 (H) 6 - 23 mg/dL   Creatinine, Ser 1.61 (HH) 0.40 - 1.50 mg/dL   Total Bilirubin 0.5 0.2 - 1.2 mg/dL   Alkaline Phosphatase 78 39 - 117 U/L   AST 40 (H) 0 - 37 U/L   ALT 32 0 - 53 U/L   Total Protein 7.2 6.0 - 8.3 g/dL   Albumin 3.9 3.5 - 5.2 g/dL   GFR 0.96 (LL) >04.54 mL/min    Comment: Calculated using the CKD-EPI Creatinine Equation (2021)   Calcium 9.6 8.4 - 10.5 mg/dL  CBC with Differential/Platelet     Status: Abnormal   Collection Time: 03/27/23  2:30 PM  Result Value Ref Range   WBC 10.5 4.0 - 10.5 K/uL   RBC 2.52 (L) 4.22 - 5.81 Mil/uL   Hemoglobin 10.1 (L) 13.0 - 17.0 g/dL   HCT 09.8 (L) 11.9 - 14.7 %   MCV 121.6 (H) 78.0 - 100.0 fl   MCHC 33.1 30.0 - 36.0 g/dL   RDW 82.9 (H) 56.2 - 13.0 %   Platelets 196.0  150.0 - 400.0 K/uL   Neutrophils Relative % 80.1 (H) 43.0 - 77.0 %   Lymphocytes Relative 7.9 (L) 12.0 - 46.0 %   Monocytes Relative 8.6 3.0 - 12.0 %   Eosinophils Relative 2.9 0.0 - 5.0 %   Basophils Relative 0.5 0.0 - 3.0 %   Neutro Abs 8.4 (H) 1.4 - 7.7 K/uL   Lymphs Abs 0.8 0.7 - 4.0 K/uL   Monocytes Absolute 0.9 0.1 - 1.0 K/uL   Eosinophils Absolute 0.3 0.0 - 0.7 K/uL   Basophils Absolute 0.1 0.0 - 0.1 K/uL  Comp Met (CMET)     Status: Abnormal   Collection Time: 04/08/23  3:03 PM  Result Value Ref Range   Sodium 137 135 - 145 mEq/L   Potassium 4.6 3.5 - 5.1 mEq/L   Chloride 97 96 - 112 mEq/L   CO2 32 19 - 32 mEq/L   Glucose, Bld 74 70 - 99 mg/dL   BUN 18 6 - 23 mg/dL   Creatinine, Ser 8.65 (HH) 0.40 - 1.50 mg/dL   Total Bilirubin 0.5 0.2 - 1.2 mg/dL   Alkaline Phosphatase 80 39 - 117 U/L   AST 23 0 - 37 U/L   ALT 16 0 - 53 U/L   Total Protein 7.3 6.0 - 8.3 g/dL   Albumin 4.2 3.5 - 5.2 g/dL   GFR 7.84 (LL) >69.62 mL/min    Comment: Calculated using the CKD-EPI Creatinine Equation (2021)   Calcium 9.7 8.4 - 10.5 mg/dL  CBC with Differential     Status: Abnormal   Collection Time: 04/08/23  3:03 PM  Result Value Ref Range   WBC 13.1 (H) 4.0 - 10.5 K/uL   RBC 2.64 (L) 4.22 - 5.81 Mil/uL   Hemoglobin 10.4 (L) 13.0 - 17.0 g/dL   HCT 95.2 (L) 84.1 - 32.4 %   MCV 122.3 Repeated and verified X2. (H) 78.0 - 100.0 fl   MCHC 32.3 30.0 - 36.0 g/dL   RDW 40.1 (H) 02.7 - 25.3 %   Platelets 248.0 150.0 - 400.0 K/uL   Neutrophils Relative % 76.6 43.0 - 77.0 %   Lymphocytes Relative 9.4 (L) 12.0 - 46.0 %   Monocytes Relative 10.2 3.0 - 12.0 %   Eosinophils Relative 3.2 0.0 - 5.0 %   Basophils Relative 0.6 0.0 - 3.0 %  Neutro Abs 10.0 (H) 1.4 - 7.7 K/uL   Lymphs Abs 1.2 0.7 - 4.0 K/uL   Monocytes Absolute 1.3 (H) 0.1 - 1.0 K/uL   Eosinophils Absolute 0.4 0.0 - 0.7 K/uL   Basophils Absolute 0.1 0.0 - 0.1 K/uL  Reticulocytes     Status: None   Collection Time: 04/10/23  3:41 PM   Result Value Ref Range   Retic Ct Pct 2.6 %   ABS Retic 71,500 25,000 - 90,000 cells/uL  CBC w/Diff     Status: Abnormal   Collection Time: 04/10/23  3:41 PM  Result Value Ref Range   WBC 11.8 (H) 3.4 - 10.8 x10E3/uL   RBC 2.78 (L) 4.14 - 5.80 x10E6/uL   Hemoglobin 10.9 (L) 13.0 - 17.7 g/dL   Hematocrit 75.6 (L) 43.3 - 51.0 %   MCV 119 (H) 79 - 97 fL   MCH 39.2 (H) 26.6 - 33.0 pg   MCHC 32.8 31.5 - 35.7 g/dL   RDW 29.5 18.8 - 41.6 %   Platelets 207 150 - 450 x10E3/uL   Neutrophils 80 Not Estab. %   Lymphs 7 Not Estab. %   Monocytes 9 Not Estab. %   Eos 3 Not Estab. %   Basos 0 Not Estab. %   Neutrophils Absolute 9.4 (H) 1.4 - 7.0 x10E3/uL   Lymphocytes Absolute 0.8 0.7 - 3.1 x10E3/uL   Monocytes Absolute 1.1 (H) 0.1 - 0.9 x10E3/uL   EOS (ABSOLUTE) 0.3 0.0 - 0.4 x10E3/uL   Basophils Absolute 0.1 0.0 - 0.2 x10E3/uL   Immature Granulocytes 1 Not Estab. %   Immature Grans (Abs) 0.1 0.0 - 0.1 x10E3/uL  Basic metabolic panel     Status: Abnormal   Collection Time: 04/10/23  3:41 PM  Result Value Ref Range   Glucose 98 70 - 99 mg/dL   BUN 19 8 - 27 mg/dL   Creatinine, Ser 6.06 (H) 0.76 - 1.27 mg/dL   eGFR 10 (L) >30 ZS/WFU/9.32   BUN/Creatinine Ratio 4 (L) 10 - 24   Sodium 138 134 - 144 mmol/L   Potassium 4.7 3.5 - 5.2 mmol/L   Chloride 93 (L) 96 - 106 mmol/L   CO2 29 20 - 29 mmol/L   Calcium 10.4 (H) 8.6 - 10.2 mg/dL         has a past medical history of A-fib (HCC), Anemia, Arthritis, Cancer (HCC), COVID-19, Dyspnea, Dysrhythmia, ESRD (end stage renal disease) (HCC) (10/22/2021), Glaucoma (11/18/2021), History of DVT (deep vein thrombosis), Hydronephrosis, Idiopathic neuropathy (10/22/2021), Ileostomy in place [facility]), Obstructive uropathy, Old retinal detachment, total or subtotal, Orthostatic hypotension (10/22/2021), Sleep apnea, Stroke (HCC), Ulcerative colitis (HCC), and Ureteral stricture.   reports that he quit smoking about 39 years ago. His smoking use included  cigarettes. He started smoking about 45 years ago. He has a 12 pack-year smoking history. He has never been exposed to tobacco smoke. He has never used smokeless tobacco.  Past Surgical History:  Procedure Laterality Date   BASAL CELL CARCINOMA EXCISION     10/23   COLON SURGERY     creation of j pouch     and subsequent takedown of j pouch   EYE SURGERY     IR NEPHROSTOMY EXCHANGE LEFT  12/10/2021   IR NEPHROSTOMY EXCHANGE LEFT  04/22/2022   IR NEPHROSTOMY EXCHANGE LEFT  07/29/2022   IR NEPHROSTOMY EXCHANGE LEFT  10/28/2022   IR NEPHROSTOMY EXCHANGE LEFT  02/05/2023   LEFT HEART CATH AND CORONARY ANGIOGRAPHY N/A 07/22/2022  Procedure: LEFT HEART CATH AND CORONARY ANGIOGRAPHY;  Surgeon: Swaziland, Peter M, MD;  Location: [facility] INVASIVE CV LAB;  Service: Cardiovascular;  Laterality: N/A;   REVISION OF ARTERIOVENOUS GORETEX GRAFT Left 05/06/2022   Procedure: REDO LEFT THIGH ARTERIOVENOUS 4-7 MM GORETEX GRAFT;  Surgeon: Chuck Hint, MD;  Location: [facility] OR;  Service: Vascular;  Laterality: Left;   SMALL INTESTINE SURGERY     TOTAL COLECTOMY      Allergies  Allergen Reactions   Cephalosporins Rash   Ciprofloxacin Itching and Rash   Baclofen Other (See Comments)    Altered mental status, after accidental overdose      Immunization History  Administered Date(s) Administered   Influenza, High Dose Seasonal PF 04/20/2018   Influenza, Seasonal, Injecte, Preservative Fre 05/16/2014   Influenza-Unspecified 03/17/2012, 04/15/2013, 05/16/2014, 05/09/2015, 05/07/2016, 03/24/2017, 05/31/2017, 04/10/2019   PFIZER(Purple Top)SARS-COV-2 Vaccination 07/16/2019, 08/09/2019   Pneumococcal Conjugate-13 01/09/2016, 04/06/2016   Pneumococcal Polysaccharide-23 03/17/2012, 11/10/2017   Zoster, Live 04/18/2013    Family History  Problem Relation Age of Onset   Stroke Mother    Cancer Father    Esophageal cancer Brother      Current Outpatient Medications:    azelastine (ASTELIN) 0.1 % nasal  spray, Place 2 sprays into both nostrils 2 (two) times daily., Disp: 30 mL, Rfl: 12   calcitRIOL (ROCALTROL) 0.25 MCG capsule, Take 1 mcg by mouth daily., Disp: , Rfl:    Cyanocobalamin (B-12) 1000 MCG SUBL, Place 1 tablet under the tongue daily at 6 (six) AM., Disp: 90 tablet, Rfl: 3   cyanocobalamin (VITAMIN B12) 1000 MCG tablet, Take 1,000 mcg by mouth daily., Disp: , Rfl:    ELIQUIS 2.5 MG TABS tablet, Take 1 tablet (2.5 mg total) by mouth 2 (two) times daily., Disp: 60 tablet, Rfl: 11   famotidine (PEPCID AC) 10 MG tablet, Take after dialysis on MWF - let us know if symptoms do not improve, Disp: 30 tablet, Rfl: 3   finasteride (PROSCAR) 5 MG tablet, Take 5 mg by mouth daily., Disp: , Rfl:    gabapentin (NEURONTIN) 100 MG capsule, Take 1 capsule by mouth daily., Disp: , Rfl:    ibuprofen (ADVIL) 800 MG tablet, Take 800 mg by mouth 4 (four) times daily as needed., Disp: , Rfl:    latanoprost (XALATAN) 0.005 % ophthalmic solution, Place 1 drop into the right eye at bedtime., Disp: , Rfl:    mesalamine (LIALDA) 1.2 g EC tablet, Take 2.4 g by mouth daily., Disp: , Rfl:    midodrine (PROAMATINE) 10 MG tablet, Take 1 tablet (10 mg total) by mouth 3 (three) times daily. Takes 5 mg plus 10mg  for 15 mg total three times a day- started by nephrology in [place], Disp: 270 tablet, Rfl: 3   midodrine (PROAMATINE) 5 MG tablet, TAKE 1 TABLET 3 TIMES DAILY WITH MEALS. TAKES 5 MG PLUS 10MG  FOR 15 MG TOTAL THREE TIMES A DAY- STARTED BY NEPHROLOGY IN [place], Disp: 270 tablet, Rfl: 3   nystatin (MYCOSTATIN/NYSTOP) powder, Apply 1 Application topically 3 (three) times daily., Disp: 60 g, Rfl: 4   Omega 3 1000 MG CAPS, Take 1,000 mg by mouth daily., Disp: , Rfl:    pregabalin (LYRICA) 50 MG capsule, Take 1 capsule (50 mg total) by mouth daily., Disp: 90 capsule, Rfl: 3   propranolol (INDERAL) 10 MG tablet, Take 1 tablet by mouth daily., Disp: , Rfl:    sertraline (ZOLOFT) 25 MG tablet, Take 1 tablet (25 mg total)  by mouth  daily., Disp: 30 tablet, Rfl: 3   sevelamer carbonate (RENVELA) 800 MG tablet, Take 2,400 mg by mouth 3 (three) times daily with meals., Disp: , Rfl:    sodium zirconium cyclosilicate (LOKELMA) 10 g PACK packet, Take 10 g by mouth 4 (four) times a week. Non Dialysis days- Tues, Thurs, Saturday, Sunday, Disp: , Rfl:    temazepam (RESTORIL) 15 MG capsule, Take 15 mg by mouth at bedtime as needed., Disp: , Rfl:    timolol (TIMOPTIC) 0.5 % ophthalmic solution, Place 1 drop into both eyes daily., Disp: , Rfl:    traMADol (ULTRAM) 50 MG tablet, Take 50 mg by mouth every 8 (eight) hours as needed (prn pain)., Disp: , Rfl:    trimethoprim-polymyxin b (POLYTRIM) ophthalmic solution, Place 1 drop into both eyes every 4 (four) hours., Disp: 10 mL, Rfl: 0   zolpidem (AMBIEN) 5 MG tablet, Take 5 mg by mouth at bedtime as needed for sleep., Disp: , Rfl:       Objective:   Vitals:   04/14/23 0842  BP: 121/70  Pulse: 76  SpO2: 99%  Weight: 158 lb 9.6 oz (71.9 kg)  Height: 5\' 7"  (1.702 m)    Estimated body mass index is 24.84 kg/m as calculated from the following:   Height as of this encounter: 5\' 7"  (1.702 m).   Weight as of this encounter: 158 lb 9.6 oz (71.9 kg).  @WEIGHTCHANGE @  [employer]   04/14/23 0842  Weight: 158 lb 9.6 oz (71.9 kg)     Physical Exam   General: No distress.  Looks somewhat physically deconditioned.  Is hard of hearing. O2 at rest: no Cane present: no Sitting in wheel chair: no Frail: mild Obese: no Neuro: Alert and Oriented x 3. GCS 15. Speech normal Psych: Pleasant Resp:  Barrel Chest - no.  Wheeze - no, Crackles - mild possil, No overt respiratory distress CVS: Normal heart sounds. Murmurs - no Ext: Stigmata of Connective Tissue Disease - no HEENT: Normal upper airway. PEERL +. No post nasal drip aBD: Has colostomy bag on the right side and left nephrostomy tube.+        Assessment:       ICD-10-CM   1. DOE (dyspnea on exertion)  R06.09      2. History of anemia  Z86.2     3. History of 2019 novel coronavirus disease (COVID-19)  Z86.16     4. ESRD (end stage renal disease) on dialysis (HCC)  N18.6    Z99.2     5. Grade I diastolic dysfunction  I51.89     6. Former smoker  Z87.891          Plan:     Patient Instructions     ICD-10-CM   1. DOE (dyspnea on exertion)  R06.09     2. History of anemia  Z86.2     3. History of 2019 novel coronavirus disease (COVID-19)  Z86.16     4. ESRD (end stage renal disease) on dialysis (HCC)  N18.6    Z99.2     5. Grade I diastolic dysfunction  I51.89     6. Former smoker  Z87.891      Shortness of breath can be multifactorial and this could be associated with loss of physical conditioning, anemia, stiff heart muscle [you already had it a year ago but at a mild level] and possible post COVID or COVID unrelated pulmonary fibrosis or even smoking-related emphysema  Plan - Do high-resolution CT  chest supine and prone - Do full pulmonary function test -Hold off on echocardiogram [most recent 07 May 2022]  Follow-up - Return to see nurse practitioner or Dr. Marchelle Gearing in the next 4-6 weeks [video visit is fine to discuss test results]     FOLLOWUP Return in about 5 weeks (around 05/19/2023) for 15 min visit, Face to Face OR Video Visit, with Dr Marchelle Gearing, with any of the APPS.    SIGNATURE    Dr. Kalman Shan, M.D., F.C.C.P,  Pulmonary and Critical Care Medicine Staff Physician, [facility] Director - Interstitial Lung Disease  Program  Pulmonary Fibrosis [facility] [place], [place], [address]  Pager: [phone], If no answer or between  15:00h - 7:00h: call [phone] Telephone: [phone]  9:28 AM 04/14/2023

## 2023-04-21 ENCOUNTER — Telehealth: Payer: Self-pay | Admitting: Internal Medicine

## 2023-04-21 DIAGNOSIS — R0609 Other forms of dyspnea: Secondary | ICD-10-CM

## 2023-04-21 DIAGNOSIS — Z87891 Personal history of nicotine dependence: Secondary | ICD-10-CM

## 2023-04-21 NOTE — Telephone Encounter (Signed)
Joshua Riley daughter checking on scheduling patient for CT scan of the chest. Did not see an order in active requests. Joshua Riley phone number is [phone].

## 2023-04-22 NOTE — Telephone Encounter (Signed)
Order placed for HRCT and PFT. Notified Bradly Chris that someone would call to schedule.

## 2023-04-28 ENCOUNTER — Other Ambulatory Visit (HOSPITAL_COMMUNITY): Payer: Self-pay | Admitting: Interventional Radiology

## 2023-04-28 DIAGNOSIS — N1330 Unspecified hydronephrosis: Secondary | ICD-10-CM

## 2023-05-01 ENCOUNTER — Ambulatory Visit (HOSPITAL_COMMUNITY)
Admission: RE | Admit: 2023-05-01 | Discharge: 2023-05-01 | Disposition: A | Discharge status: Home / Self Care | Payer: Medicare Other | Source: Ambulatory Visit | Attending: Internal Medicine | Admitting: Internal Medicine

## 2023-05-01 DIAGNOSIS — R0609 Other forms of dyspnea: Secondary | ICD-10-CM | POA: Insufficient documentation

## 2023-05-03 ENCOUNTER — Other Ambulatory Visit: Payer: Self-pay | Admitting: Family Medicine

## 2023-05-05 ENCOUNTER — Encounter: Payer: Self-pay | Admitting: Internal Medicine

## 2023-05-07 ENCOUNTER — Ambulatory Visit (HOSPITAL_COMMUNITY)
Admission: RE | Admit: 2023-05-07 | Discharge: 2023-05-07 | Disposition: A | Discharge status: Home / Self Care | Payer: Medicare Other | Source: Ambulatory Visit | Attending: Interventional Radiology | Admitting: Interventional Radiology

## 2023-05-07 ENCOUNTER — Other Ambulatory Visit (HOSPITAL_COMMUNITY): Payer: Self-pay | Admitting: Interventional Radiology

## 2023-05-07 DIAGNOSIS — Z436 Encounter for attention to other artificial openings of urinary tract: Secondary | ICD-10-CM | POA: Diagnosis present

## 2023-05-07 DIAGNOSIS — N1330 Unspecified hydronephrosis: Secondary | ICD-10-CM

## 2023-05-07 HISTORY — PX: IR NEPHROSTOMY EXCHANGE LEFT: IMG6069

## 2023-05-07 MED ORDER — IOHEXOL 300 MG/ML  SOLN
50.0000 mL | Freq: Once | INTRAMUSCULAR | Status: AC | PRN
Start: 2023-05-07 — End: 2023-05-07
  Administered 2023-05-07: 10 mL

## 2023-05-07 MED ORDER — LIDOCAINE HCL 1 % IJ SOLN
INTRAMUSCULAR | Status: AC
Start: 2023-05-07 — End: ?
  Filled 2023-05-07: qty 20

## 2023-05-13 NOTE — Telephone Encounter (Signed)
Informed patient's daughter of imaging results/notes.

## 2023-05-22 ENCOUNTER — Ambulatory Visit (INDEPENDENT_AMBULATORY_CARE_PROVIDER_SITE_OTHER): Payer: Medicare Other | Admitting: Family Medicine

## 2023-05-22 ENCOUNTER — Telehealth: Payer: Self-pay | Admitting: Family Medicine

## 2023-05-22 ENCOUNTER — Encounter: Payer: Self-pay | Admitting: Family Medicine

## 2023-05-22 VITALS — BP 107/57 | HR 64 | Temp 97.1°F | Ht 67.0 in | Wt 156.4 lb

## 2023-05-22 DIAGNOSIS — I4819 Other persistent atrial fibrillation: Secondary | ICD-10-CM | POA: Diagnosis not present

## 2023-05-22 DIAGNOSIS — Z933 Colostomy status: Secondary | ICD-10-CM

## 2023-05-22 DIAGNOSIS — R1033 Periumbilical pain: Secondary | ICD-10-CM

## 2023-05-22 DIAGNOSIS — F32A Depression, unspecified: Secondary | ICD-10-CM

## 2023-05-22 DIAGNOSIS — G609 Hereditary and idiopathic neuropathy, unspecified: Secondary | ICD-10-CM | POA: Diagnosis not present

## 2023-05-22 DIAGNOSIS — G4700 Insomnia, unspecified: Secondary | ICD-10-CM | POA: Diagnosis not present

## 2023-05-22 NOTE — Progress Notes (Signed)
Phone [phone] In person visit   Subjective:   Joshua Riley is a 87 y.o. year old very pleasant male patient who presents for/with See problem oriented charting Chief Complaint  Patient presents with   Atrial Fibrillation   Anemia   Arthritis   Insomnia    Pt c/o of Difficulty sleeping, states he is not taking Ambien only Benadryl    Abdominal Pain    Pt c/o of abd pain after eating meals for 1 month, pt tried Pepto Bismol that helped some.     Past Medical History-  Patient Active Problem List   Diagnosis Date Noted   DOE (dyspnea on exertion) 07/22/2022    Priority: High   Ulcerative colitis (HCC) 06/02/2022    Priority: High   ESRD (end stage renal disease) on dialysis (HCC) 05/06/2022    Priority: High   Atrial fibrillation, persistent (HCC) 11/18/2021    Priority: High   Orthostatic hypotension 10/22/2021    Priority: High   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    Priority: High   Attention to urostomy (HCC) 10/22/2021    Priority: High   Glaucoma 11/18/2021    Priority: Medium    Idiopathic neuropathy 10/22/2021    Priority: Medium    Coag negative Staphylococcus bacteremia 06/06/2022    Priority: Low   SBO (small bowel obstruction) (HCC) 11/18/2021    Priority: Low   Macrocytic anemia 04/08/2023   Depressive disorder 04/08/2023   Perirectal abscess 03/23/2023   Infection due to ESBL-producing Escherichia coli 03/23/2023   GI bleeding 10/07/2022    Medications- reviewed and updated Current Outpatient Medications  Medication Sig Dispense Refill   calcitRIOL (ROCALTROL) 0.25 MCG capsule Take 1 mcg by mouth daily.     Cyanocobalamin (B-12) 1000 MCG SUBL Place 1 tablet under the tongue daily at 6 (six) AM. 90 tablet 3   cyanocobalamin (VITAMIN B12) 1000 MCG tablet Take 1,000 mcg by mouth daily.     ELIQUIS 2.5 MG TABS tablet Take 1 tablet (2.5 mg total) by mouth 2 (two) times daily. 60 tablet 11   latanoprost (XALATAN) 0.005 % ophthalmic solution Place  1 drop into the right eye at bedtime.     mesalamine (LIALDA) 1.2 g EC tablet Take 2.4 g by mouth daily.     midodrine (PROAMATINE) 10 MG tablet Take 1 tablet (10 mg total) by mouth 3 (three) times daily. Takes 5 mg plus 10mg  for 15 mg total three times a day- started by nephrology in [place] 270 tablet 3   midodrine (PROAMATINE) 5 MG tablet TAKE 1 TABLET 3 TIMES DAILY WITH MEALS. TAKES 5 MG PLUS 10MG  FOR 15 MG TOTAL THREE TIMES A DAY- STARTED BY NEPHROLOGY IN [place] 270 tablet 3   nystatin (MYCOSTATIN/NYSTOP) powder Apply 1 Application topically 3 (three) times daily. 60 g 4   sertraline (ZOLOFT) 25 MG tablet Take 1 tablet (25 mg total) by mouth daily. 30 tablet 3   sevelamer carbonate (RENVELA) 800 MG tablet Take 2,400 mg by mouth 3 (three) times daily with meals.     sodium zirconium cyclosilicate (LOKELMA) 10 g PACK packet Take 10 g by mouth 4 (four) times a week. Non Dialysis days- Tues, Thurs, Saturday, Sunday     timolol (TIMOPTIC) 0.5 % ophthalmic solution Place 1 drop into both eyes daily.     traMADol (ULTRAM) 50 MG tablet Take 50 mg by mouth every 8 (eight) hours as needed (prn pain).     No current facility-administered medications for this visit.  Objective:  BP (!) 107/57   Pulse 64   Temp (!) 97.1 F (36.2 C)   Ht 5\' 7"  (1.702 m)   Wt 156 lb 6.4 oz (70.9 kg)   SpO2 99%   BMI 24.50 kg/m  Gen: NAD, resting comfortably CV: RRR no murmurs rubs or gallops Lungs: CTAB no crackles, wheeze, rhonchi Abdomen: soft/nontender other than mild pain periumbilically/nondistended/normal bowel sounds. No rebound or guarding.  Colostomy bag in place-peers to be functioning with no obvious infection surrounding this Ext: no edema Skin: warm, dry     Assessment and Plan    #Rectal discomfort with history of bleeding despite colectomy- led to referral to surgeon then he saw [facility] due to pelvic abscess which required drain and antibiotics- had drain placed (but later got dislodged)but  unfortunately developed infection - that is better but he is still in need of wound care which he has on December 12th but still has rectal discomfort. Plan with [facility] has been as needed follow up. No longer has drain.  -We are hoping for further improvement with wound care.  See CT results under the abdominal pain section but I also wonder if he will eventually need another surgery-he reports he was told this would be challenging and they would like to avoid if possible -Fortunately prior rectal bleeding has substantially slowed  # Shortness of breath-will be seeing pulmonary soon-concern for possible UIP based on CT 05/01/2023-was workup by Dr. Jon Billings last month  # Insomnia S:Medication:-Patient has a history of insomnia and has taken Ambien and temazepam in the past  per list (though I have not prescribed it and he denies taking these today-it seems like several erroneous medications were added to his list-unclear when/where and we went through cleanings up together).  Reports recently having issues with this and is started take Benadryl-- only helps 2 hours.   We discussed that his age and being on dialysis the potential risks of Benadryl including falls and confusion -some issues with the discomfort above but is only sleeping 2 hours -As he was seen by my colleague 10-24 Dr. Jon Billings and there was concern for depression he was started on sertraline and he also considered referral for therapy A/P: Patient with insomnia that seems to have worsened with his rectal discomfort issues related to abscess and prior surgeries with ongoing need for wound care which is pending -Options extremely limited due to dialysis-we investigated this together on up-to-date and the only clear indication was for CBT-I-no medications at all recommended from my reading.  Research further and some suggestions of mild benefit from melatonin (he reports he has tried this but I wanted to give him more information on this as  below in case he wanted to retry) - I recommended the following From AVS "  Patient Instructions   Benadryl can cause confusion and falls so not ideal. I recommend against  Short term "sleep reset" with melatonin for 2 weeks -try to have consistent bedtime within about 15 minutes for these 2 weeks - stop ALL screens 30 minutes before bed- do something without screens that is not too mentally stimulating- leisure reading, folding socks, time with pets - at the same time you stop screens start to turn the lights down -consider black out shades or sleep mask such as [place] mask for [place]otu [place]10 on [place] -also at the same time take melatonin 1-3 mg - if not doing better after about 2 weeks let me know  The main treatment is  CBT-I a form of therapy- I referred you today  Please call [phone] to schedule a visit with [facility] with Salomon Fick - please tell the office you were directly referred by Dr. Durene Cal ... Recommended follow up: Return for next already scheduled visit or sooner if needed. "  # Abdominal pain after meals S:Medication: Patient reports abdominal pain after meals for 1 month-Pepto-Bismol gave mild relief. This pain is around belly button. Most of the time tolerable but one day had it for 12 hours.  -Previously in August we prescribed Pepcid 10 mg to take when he was a Friday after dialysis. Tums helps for nausea and he has been taking that.   -reports prior Pepcid after dialysis did not help  He had a large gallstone on imaging 04/09/23 as well as the following "1. Slight interval enlargement of previously seen presacral abscess,  measuring up to 3.4 cm when previously up to 2.6 cm. Persistent  inflammation along the prior right transgluteal drain tract. A fistulous  connection to the perineum is slightly more pronounced than on prior.   2. Small (1.9 cm) collection adjacent to the arterial and venous  anastomoses of a left groin dialysis loop  graft, unchanged from prior.   3. Stable asymmetric thickening of the left urinary bladder wall, possibly  related to reactive changes to the above process.   4. Several additional stable findings including small bowel parastomal  hernia, indwelling left nephrostomy tube, large gallstone, without  pericholecystic inflammation. " - he denies pain near ostomy A/P: Patient reporting primarily periumbilical abdominal pain after meals and occasionally some nausea that Pepto-Bismol or Tums are both helpful for.  For the most part this is mild but did have a day recently where pain lasted 12 hours but then the next day he felt fine. - Has had multiple CT scans in recent months but reports the pain started after the last scan-offered to repeat this but he would like to hold for now and monitor-he agrees to follow-up if new or worsening symptoms  # Atrial fibrillation- prior Dr. Shari Prows S: Rate controlled without medicine Anticoagulated with eliquis 2.5 mg BID but he reports only taking once a day to try to reduce bleeding from his rectum A/P: Appropriately rate controlled without medicine.  Anticoagulation is only once a day-encouraged him to retrial twice a day since the bleeding has substantially slowed  # Depression S:Medication: Sertraline 25 mg started by Dr. Jon Billings A/P: PHQ-9 mildly worsened-he reports he feels he is less depressed and less anxious though-wants to continue to monitor through February visit on current medication  # Idiopathic Neuropathy- started after covid though S: Medication: None  Lyrica 50 mg started in mid 2022. Mildly helpful-she reports stopped at this in 2020 for concern for it contributing to breathing issues for which she will be seeing pulmonary  A/P: He is dealing with this but wants to remain off medicine at present  Recommended follow up: Return for next already scheduled visit or sooner if needed. Future Appointments  Date Time Provider Department Center   06/02/2023 11:30 AM [facility] ANNUAL WELLNESS VISIT 1 [facility]  06/09/2023  9:30 AM [facility] PFT ROOM [facility]  06/09/2023 11:00 AM Glenford Bayley, NP [facility] None  06/18/2023  9:30 AM Maxwell Caul, MD [facility]  08/04/2023  9:00 AM [facility] 1 [facility]  08/13/2023  8:20 AM Durene Cal Aldine Contes, MD [facility] PEC    Lab/Order associations:   ICD-10-CM   1. Insomnia,  unspecified type  G47.00 Ambulatory referral to Psychology    2. Atrial fibrillation, persistent (HCC)  I48.19     3. Idiopathic neuropathy  G60.9     4. Depressive disorder  F32.A       No orders of the defined types were placed in this encounter.   Return precautions advised.  Tana Conch, MD

## 2023-05-22 NOTE — Telephone Encounter (Signed)
Pt was seen this morning and he does want to get a Cat Scan done. Please advise.

## 2023-05-22 NOTE — Patient Instructions (Addendum)
  Benadryl can cause confusion and falls so not ideal. I recommend against  Short term "sleep reset" with melatonin for 2 weeks -try to have consistent bedtime within about 15 minutes for these 2 weeks - stop ALL screens 30 minutes before bed- do something without screens that is not too mentally stimulating- leisure reading, folding socks, time with pets - at the same time you stop screens start to turn the lights down -consider black out shades or sleep mask such as [place] bear mask for abotu $10 on [place] -also at the same time take melatonin 1-3 mg - if not doing better after about 2 weeks let me know  The main treatment is CBT-I a form of therapy- I referred you today  Please call [phone] to schedule a visit with [facility] with Salomon Fick - please tell the office you were directly referred by Dr. Nils Pyle wanted to keep trying the Tums and hold off on repeat CT scan unless worsens  Recommended follow up: Return for next already scheduled visit or sooner if needed.

## 2023-05-25 NOTE — Telephone Encounter (Signed)
Called and informed pt daughter

## 2023-05-25 NOTE — Telephone Encounter (Signed)
I ordered this-he needs to have a dialysis session within about 24 hours of the scan as we will be using contrast-make sure he knows this for when he schedules it and I also wrote it in the notes for radiology

## 2023-05-25 NOTE — Telephone Encounter (Signed)
FYI

## 2023-05-26 NOTE — Telephone Encounter (Signed)
Can you copy this over to a phone note please?  For all patient CBT-I is recommended as first-line and particularly with dialysis-Ambien does increase fall risk and is associated with increased risk of dementia.  It can increase confusion.  If he were going to trial this and only use sparingly he would basically need someone available and he would have to be willing to call if he had to get up to go to the bathroom  (since he still urinates as of the last time we discussed) or walk in general probably for 10 hours at least after the dose of medicine

## 2023-06-08 NOTE — Progress Notes (Unsigned)
@Patient  ID: Joshua Riley, male    DOB: 08-May-1936, 87 y.o.   MRN: [id number]  No chief complaint on file.   Referring provider: Shelva Majestic, MD  HPI:  Joshua Riley 87 y.o. -presents with his daughter Joshua Riley.  She is an independent historian here today.  She works at the [employer] on [employer].  She has lived in [place] for 30 years.  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  And then 2 years ago his significant other Joshua Riley passed away and then he relocated to [place] to live next to his daughter.  He has a history of colostomy since 2012.  He has a history of nephrostomy tubes somewhere along the way.  He has been on dialysis for the last 2 years.  He makes some amount of urine.  He tells me that he had COVID 2 or 3 years ago and was hospitalized in [facility] was on oxygen.  And then he got better but now for the last 4 months he has had shortness of breath on exertion relieved by rest.  He has to walk slowly at [facility].  When he takes a shower he gets winded but not for changing clothes.  Sometimes getting out of the car makes him short of breath.  He does not think he can do 1 flight of stairs.  There is no chest pain no cough or wheezing.  Review of the labs indicate anemia  Echocardiogram #2023 grade 1 diastolic dysfunction  CT abdomen lung image May 2023: Left lower lobe atelectasis personally visualized.  And agree with final report  Chest x-ray 04/10/2023: Personally visualized official report is pending.  Looks clear to me.  Walking desaturation test 04/14/2023; 200 feet in office -> did not desaturate  Shortness of breath can be multifactorial and this could be associated with loss of physical conditioning, anemia, stiff heart muscle [you already had it a year ago but at a mild level] and possible post  COVID or COVID unrelated pulmonary fibrosis or even smoking-related emphysema  Plan - Do high-resolution CT chest supine and prone - Do full pulmonary function test -Hold off on echocardiogram [most recent 07 May 2022]  Follow-up - Return to see nurse practitioner or Dr. Marchelle Gearing in the next 4-6 weeks [video visit is fine to discuss test results]  06/09/2023 Patient presents today review recent HRCT and pulmonary function results.  HRCT in November showed questionable areas of mild coarsened subpleural groundglass and's subpleural reticulation.  Mild bronchiectasis.  Findings are indeterminant for UIP.      Allergies  Allergen Reactions   Cephalosporins Rash   Ciprofloxacin Itching and Rash   Baclofen Other (See Comments)    Altered mental status, after accidental overdose      Immunization History  Administered Date(s) Administered   Influenza, High Dose Seasonal PF 04/20/2018   Influenza, Seasonal, Injecte, Preservative Fre 05/16/2014   Influenza-Unspecified 03/17/2012, 04/15/2013, 05/16/2014, 05/09/2015, 05/07/2016, 03/24/2017, 05/31/2017, 04/10/2019   PFIZER(Purple Top)SARS-COV-2 Vaccination 07/16/2019, 08/09/2019   Pneumococcal Conjugate-13 01/09/2016, 04/06/2016   Pneumococcal Polysaccharide-23 03/17/2012, 11/10/2017   Zoster, Live 04/18/2013    Past Medical History:  Diagnosis Date   A-fib (HCC)    Anemia    Arthritis    Cancer (HCC)    Basal cell   COVID-19    2021  Dyspnea    Dysrhythmia    Afib-controlled on eliquis   ESRD (end stage renal disease) (HCC) 10/22/2021   Glaucoma 11/18/2021   History of DVT (deep vein thrombosis)    Hydronephrosis    managed wtih a PCN   Idiopathic neuropathy 10/22/2021   lyrica    Ileostomy in place [place])    Obstructive uropathy    With chronic left nephrostomy   Old retinal detachment, total or subtotal    Orthostatic hypotension 10/22/2021   Sleep apnea    does not need a machine   Stroke (HCC)     Ulcerative colitis (HCC)    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     Cyanocobalamin (B-12) 1000 MCG SUBL Place 1 tablet under the tongue daily at 6 (six) AM. 90 tablet 3   cyanocobalamin (VITAMIN B12) 1000 MCG tablet Take 1,000 mcg by mouth daily.     ELIQUIS 2.5 MG TABS tablet Take 1 tablet (2.5 mg total) by mouth 2 (two) times daily. 60 tablet 11   latanoprost (XALATAN) 0.005 % ophthalmic solution Place 1 drop into the right eye at bedtime.     mesalamine (LIALDA) 1.2 g EC tablet Take 2.4 g by mouth daily.     midodrine (PROAMATINE) 10 MG tablet Take 1 tablet (10 mg total) by mouth 3 (three) times daily. Takes 5 mg plus 10mg  for 15 mg total three times a day- started by nephrology in [place] 270 tablet 3   midodrine (PROAMATINE) 5 MG tablet TAKE 1 TABLET 3 TIMES DAILY WITH MEALS. TAKES 5 MG PLUS 10MG  FOR 15 MG TOTAL THREE TIMES A DAY- STARTED BY NEPHROLOGY IN [place] 270 tablet 3   nystatin (MYCOSTATIN/NYSTOP) powder Apply 1 Application topically 3 (three) times daily. 60 g 4   sertraline (ZOLOFT) 25 MG tablet Take 1 tablet (25 mg total) by mouth daily. 30 tablet 3   sevelamer carbonate (RENVELA) 800 MG tablet Take 2,400 mg by mouth 3 (three) times daily with meals.     sodium zirconium cyclosilicate (LOKELMA) 10 g PACK packet Take 10 g by mouth 4 (four) times a week. Non Dialysis days- Tues, Thurs, Saturday, Sunday     timolol (TIMOPTIC) 0.5 % ophthalmic solution Place 1 drop into both eyes  daily.     traMADol (ULTRAM) 50 MG tablet Take 50 mg by mouth every 8 (eight) hours as needed (prn pain).     No facility-administered medications prior to visit.      Review of Systems  Review of Systems   Physical Exam  There were no vitals taken for this visit. Physical Exam   Lab Results:  CBC    Component Value Date/Time   WBC 11.8 (H) 04/10/2023 1541   WBC 13.1 (H) 04/08/2023 1503   RBC 2.78 (L) 04/10/2023 1541   RBC 2.64 (L) 04/08/2023 1503   HGB 10.9 (L) 04/10/2023 1541   HCT 33.2 (L) 04/10/2023 1541  PLT 207 04/10/2023 1541   MCV 119 (H) 04/10/2023 1541   MCH 39.2 (H) 04/10/2023 1541   MCH 38.8 (H) 09/27/2022 1028   MCHC 32.8 04/10/2023 1541   MCHC 32.3 04/08/2023 1503   RDW 13.2 04/10/2023 1541   LYMPHSABS 0.8 04/10/2023 1541   MONOABS 1.3 (H) 04/08/2023 1503   EOSABS 0.3 04/10/2023 1541   BASOSABS 0.1 04/10/2023 1541    BMET    Component Value Date/Time   NA 138 04/10/2023 1541   K 4.7 04/10/2023 1541   CL 93 (L) 04/10/2023 1541   CO2 29 04/10/2023 1541   GLUCOSE 98 04/10/2023 1541   GLUCOSE 74 04/08/2023 1503   BUN 19 04/10/2023 1541   CREATININE 5.16 (H) 04/10/2023 1541   CALCIUM 10.4 (H) 04/10/2023 1541   GFRNONAA 8 (L) 06/07/2022 0223    BNP No results found for: "BNP"  ProBNP No results found for: "PROBNP"  Imaging: No results found.   Assessment & Plan:   No problem-specific Assessment & Plan notes found for this encounter.     Glenford Bayley, NP 06/08/2023

## 2023-06-09 ENCOUNTER — Encounter: Payer: Self-pay | Admitting: Family Medicine

## 2023-06-09 ENCOUNTER — Encounter: Payer: Self-pay | Admitting: Primary Care

## 2023-06-09 ENCOUNTER — Ambulatory Visit (HOSPITAL_BASED_OUTPATIENT_CLINIC_OR_DEPARTMENT_OTHER): Payer: Medicare Other | Admitting: Internal Medicine

## 2023-06-09 ENCOUNTER — Ambulatory Visit: Payer: Medicare Other | Admitting: Primary Care

## 2023-06-09 ENCOUNTER — Telehealth: Payer: Self-pay | Admitting: Primary Care

## 2023-06-09 VITALS — BP 110/67 | HR 105 | Temp 97.3°F | Ht 65.0 in | Wt 147.8 lb

## 2023-06-09 DIAGNOSIS — J45909 Unspecified asthma, uncomplicated: Secondary | ICD-10-CM | POA: Diagnosis not present

## 2023-06-09 DIAGNOSIS — R0609 Other forms of dyspnea: Secondary | ICD-10-CM | POA: Diagnosis not present

## 2023-06-09 DIAGNOSIS — Z8616 Personal history of COVID-19: Secondary | ICD-10-CM | POA: Diagnosis not present

## 2023-06-09 LAB — PULMONARY FUNCTION TEST
DL/VA % pred: 108 %
DL/VA: 4.24 ml/min/mmHg/L
DLCO cor % pred: 85 %
DLCO cor: 17.46 ml/min/mmHg
DLCO unc % pred: 75 %
DLCO unc: 15.32 ml/min/mmHg
FEF 25-75 Post: 4.3 L/s
FEF 25-75 Pre: 5.33 L/s
FEF2575-%Change-Post: -19 %
FEF2575-%Pred-Post: 324 %
FEF2575-%Pred-Pre: 402 %
FEV1-%Change-Post: 14 %
FEV1-%Pred-Post: 100 %
FEV1-%Pred-Pre: 88 %
FEV1-Post: 2.1 L
FEV1-Pre: 1.84 L
FEV1FVC-%Change-Post: -14 %
FEV1FVC-%Pred-Pre: 141 %
FEV6-%Change-Post: 32 %
FEV6-%Pred-Post: 87 %
FEV6-%Pred-Pre: 65 %
FEV6-Post: 2.43 L
FEV6-Pre: 1.84 L
FEV6FVC-%Pred-Post: 109 %
FEV6FVC-%Pred-Pre: 109 %
FVC-%Change-Post: 32 %
FVC-%Pred-Post: 80 %
FVC-%Pred-Pre: 60 %
FVC-Post: 2.44 L
FVC-Pre: 1.84 L
Post FEV1/FVC ratio: 86 %
Post FEV6/FVC ratio: 100 %
Pre FEV1/FVC ratio: 100 %
Pre FEV6/FVC Ratio: 100 %
RV % pred: 75 %
RV: 1.86 L
TLC % pred: 74 %
TLC: 4.49 L

## 2023-06-09 MED ORDER — ALBUTEROL SULFATE HFA 108 (90 BASE) MCG/ACT IN AERS: 2.0000 | INHALATION_SPRAY | Freq: Four times a day (QID) | RESPIRATORY_TRACT | 1 refills | Status: DC | PRN

## 2023-06-09 MED ORDER — FLUTICASONE-SALMETEROL 250-50 MCG/ACT IN AEPB: 1.0000 | INHALATION_SPRAY | Freq: Two times a day (BID) | RESPIRATORY_TRACT | 3 refills | Status: DC

## 2023-06-09 NOTE — Telephone Encounter (Signed)
HRCT showed Questionable areas of mildly coarsened subpleural ground-glass and subpleural reticulation. Mild cylindrical bronchiectasis. Findings are indeterminate for UIP  Hx covid-19 twice. His main complaint is dyspnea. No significant cough. PFTs with reversibility, gave him trial ICS/LABA.   Do want to consider bronchoscopy with Envisia classification or repeat HRCT in 12 month to assess for temporal changes?

## 2023-06-09 NOTE — Patient Instructions (Addendum)
  Reactive airway disease/asthma: CT chest showed some mild changes to parenchyma of your lungs/scarring, could be from covid. PFT were consistent with reactive airway disease possibly from asthma. We will start you on a daily bronchodilator and steroid inhaler to help improve your breathing. You will also have a rescue inhaler for emergencies. We will repeat a CT scan in 12 months to monitor your lung condition and may consider further evaluation with a bronchoscopy after discussing with Dr. Marchelle Gearing.  -GENERAL HEALTH MAINTENANCE: Your physical activity is limited due to shortness of breath. We recommend gradually increasing your physical activity, starting with 10 minutes of leg cycling a few times a week. We will follow up in 3 months to see how you are responding to the inhaler therapy.  RX: Advair one puff morning and evening daily Albuterol 2 puffs every 4-6 hours ONLY for breakthrough shortness of breath/wheezing   Follow-up: 3 months with Dr. Marchelle Gearing or sooner if needed / spirometry with PFTs prior

## 2023-06-09 NOTE — Telephone Encounter (Signed)
   He seems to have multiple medical problems including end-stage renal disease.  Given the fact it is indeterminate for UIP I think the risk of progression is somewhat lower.  In addition he is also age greater than 15 and is frail with medical issues.  Therefore I will see him in 2 to 4 months but with spirometry and DLCO '     Latest Ref Rng & Units 06/09/2023    9:17 AM  PFT Results  FVC-Pre L 1.84  P  FVC-Predicted Pre % 60  P  FVC-Post L 2.44  P  FVC-Predicted Post % 80  P  Pre FEV1/FVC % % 100  P  Post FEV1/FCV % % 86  P  FEV1-Pre L 1.84  P  FEV1-Predicted Pre % 88  P  FEV1-Post L 2.10  P  DLCO uncorrected ml/min/mmHg 15.32  P  DLCO UNC% % 75  P  DLCO corrected ml/min/mmHg 17.46  P  DLCO COR %Predicted % 85  P  DLVA Predicted % 108  P  TLC L 4.49  P  TLC % Predicted % 74  P  RV % Predicted % 75  P    P Preliminary result

## 2023-06-09 NOTE — Addendum Note (Signed)
Addended by: Glenford Bayley on: 06/09/2023 01:27 PM   Modules accepted: Orders

## 2023-06-09 NOTE — Telephone Encounter (Signed)
Thanks MR, ill be sure that happens

## 2023-06-09 NOTE — Progress Notes (Signed)
Full PFT Performed Today  

## 2023-06-09 NOTE — Patient Instructions (Signed)
Full PFT Performed Today  

## 2023-06-11 ENCOUNTER — Ambulatory Visit
Admission: RE | Admit: 2023-06-11 | Discharge: 2023-06-11 | Disposition: A | Discharge status: Home / Self Care | Payer: Medicare Other | Source: Ambulatory Visit | Attending: Family Medicine | Admitting: Family Medicine

## 2023-06-11 DIAGNOSIS — Z933 Colostomy status: Secondary | ICD-10-CM

## 2023-06-11 DIAGNOSIS — R1033 Periumbilical pain: Secondary | ICD-10-CM

## 2023-06-11 MED ORDER — IOPAMIDOL (ISOVUE-300) INJECTION 61%
500.0000 mL | Freq: Once | INTRAVENOUS | Status: AC | PRN
  Administered 2023-06-11: 100 mL via INTRAVENOUS

## 2023-06-12 NOTE — Telephone Encounter (Signed)
Patient called stating he is having trouble falling asleep and staying asleep. States he has tried melatonin OTC as advised by PCP but this isn't helping. Please Advise.

## 2023-06-15 DIAGNOSIS — Z87891 Personal history of nicotine dependence: Secondary | ICD-10-CM

## 2023-06-15 DIAGNOSIS — R0609 Other forms of dyspnea: Secondary | ICD-10-CM

## 2023-06-15 MED ORDER — RAMELTEON 8 MG PO TABS: 8.0000 mg | ORAL_TABLET | Freq: Every day | ORAL | 5 refills | Status: DC

## 2023-06-17 MED ORDER — BUDESONIDE 0.25 MG/2ML IN SUSP: 0.2500 mg | Freq: Two times a day (BID) | RESPIRATORY_TRACT | 2 refills | Status: DC

## 2023-06-17 MED ORDER — ALBUTEROL SULFATE (2.5 MG/3ML) 0.083% IN NEBU: 2.5000 mg | INHALATION_SOLUTION | Freq: Four times a day (QID) | RESPIRATORY_TRACT | 12 refills | Status: DC | PRN

## 2023-06-17 NOTE — Telephone Encounter (Signed)
PT daughter calling now saying no reply to [facility]. I adv her we would be sure Ms. Clent Ridges sees her message and her or her nurse will call her back @ [phone] (She states he is really hurting right now.)  Pharm is [facility] on [address]

## 2023-06-17 NOTE — Telephone Encounter (Signed)
Ill discontinue Advair. Sending in Pulmicort nebulizer to use twice daily along with albuterol up to 4 times a day as needed for shortness of breath. I sent in DME order for nebulizer machine and mask.

## 2023-06-17 NOTE — Progress Notes (Addendum)
[name], [name] ([id number]) [id number] Page 1 of 5 Visit Report for 06/18/2023 Allergy List Details Patient Name: Date of Service: Joshua Riley RD 06/18/2023 9:30 A M Medical Record Number: [id number] Patient Account Number: [id number] Date of Birth/Sex: Treating RN: 1936-01-13 (87 y.o. Joshua Riley, Joshua Riley Primary Care Talha Iser: Tana Conch Other Clinician: Referring Leeanna Slaby: Treating Suni Jarnagin/Extender: Jaquelyn Bitter in Treatment: 0 Allergies Active Allergies Cephalosporins ciprofloxacin baclofen Allergy Notes Electronic Signature(s) Signed: 06/18/2023 4:57:37 PM By: Redmond Pulling RN, BSN Previous Signature: 06/17/2023 2:22:12 PM Version By: Fonnie Mu RN Entered By: Redmond Pulling on 06/18/2023 09:53:48 -------------------------------------------------------------------------------- Arrival Information Details Patient Name: Date of Service: Joshua Riley, EDWA RD 06/18/2023 9:30 A M Medical Record Number: [id number] Patient Account Number: [id number] Date of Birth/Sex: Treating RN: [birth year] (87 y.o. M) Primary Care Quenton Recendez: Tana Conch Other Clinician: Referring Zerenity Bowron: Treating Rodolphe Edmonston/Extender: Jaquelyn Bitter in Treatment: 0 Visit Information Patient Arrived: Ambulatory Arrival Time: 09:34 Accompanied By: daughter Transfer Assistance: None Patient Identification Verified: Yes Secondary Verification Process Completed: Yes Patient Has Alerts: Yes Patient Alerts: Patient on Blood Thinner Electronic Signature(s) Signed: 06/18/2023 4:57:37 PM By: Redmond Pulling RN, BSN Entered By: Redmond Pulling on 06/18/2023 09:53:14 Jeanmarie Hubert ([id number]) [id number] Page 2 of 5 -------------------------------------------------------------------------------- Clinic Level of Care Assessment Details Patient Name: Date of Service: Joshua Riley RD 06/18/2023  9:30 A M Medical Record Number: [id number] Patient Account Number: [id number] Date of Birth/Sex: Treating RN: 1935-09-13 (87 y.o. Joshua Riley Primary Care Ayisha Pol: Tana Conch Other Clinician: Referring Herley Bernardini: Treating Shequilla Goodgame/Extender: Jaquelyn Bitter in Treatment: 0 Clinic Level of Care Assessment Items TOOL 2 Quantity Score X- 1 0 Use when only an EandM is performed on the INITIAL visit ASSESSMENTS - Nursing Assessment / Reassessment X- 1 20 General Physical Exam (combine w/ comprehensive assessment (listed just below) when performed on new pt. evals) X- 1 25 Comprehensive Assessment (HX, ROS, Risk Assessments, Wounds Hx, etc.) ASSESSMENTS - Wound and Skin A ssessment / Reassessment []  - 0 Simple Wound Assessment / Reassessment - one wound []  - 0 Complex Wound Assessment / Reassessment - multiple wounds []  - 0 Dermatologic / Skin Assessment (not related to wound area) ASSESSMENTS - Ostomy and/or Continence Assessment and Care []  - 0 Incontinence Assessment and Management []  - 0 Ostomy Care Assessment and Management (repouching, etc.) PROCESS - Coordination of Care []  - 0 Simple Patient / Family Education for ongoing care []  - 0 Complex (extensive) Patient / Family Education for ongoing care X- 1 10 Staff obtains Chiropractor, Records, T Results / Process Orders est []  - 0 Staff telephones HHA, Nursing Homes / Clarify orders / etc []  - 0 Routine Transfer to another Facility (non-emergent condition) []  - 0 Routine Hospital Admission (non-emergent condition) X- 1 15 New Admissions / Manufacturing engineer / Ordering NPWT Apligraf, etc. , []  - 0 Emergency Hospital Admission (emergent condition) []  - 0 Simple Discharge Coordination []  - 0 Complex (extensive) Discharge Coordination PROCESS - Special Needs []  - 0 Pediatric / Minor Patient Management []  - 0 Isolation Patient Management []  - 0 Hearing / Language / Visual special  needs []  - 0 Assessment of Community assistance (transportation, D/C planning, etc.) []  - 0 Additional assistance / Altered mentation []  - 0 Support Surface(s) Assessment (bed, cushion, seat, etc.) INTERVENTIONS - Wound Cleansing / Measurement []  - 0 Wound Imaging (photographs - any number of wounds) []  - 0 Wound Tracing (instead of photographs) []  - 0 Simple Wound Measurement - one  wound []  - 0 Complex Wound Measurement - multiple wounds []  - 0 Simple Wound Cleansing - one wound Jeanmarie Hubert ([id number]) [phone].pdf Page 3 of 5 []  - 0 Complex Wound Cleansing - multiple wounds INTERVENTIONS - Wound Dressings []  - 0 Small Wound Dressing one or multiple wounds []  - 0 Medium Wound Dressing one or multiple wounds []  - 0 Large Wound Dressing one or multiple wounds []  - 0 Application of Medications - injection INTERVENTIONS - Miscellaneous []  - 0 External ear exam []  - 0 Specimen Collection (cultures, biopsies, blood, body fluids, etc.) []  - 0 Specimen(s) / Culture(s) sent or taken to Lab for analysis []  - 0 Patient Transfer (multiple staff / Michiel Sites Lift / Similar devices) []  - 0 Simple Staple / Suture removal (25 or less) []  - 0 Complex Staple / Suture removal (26 or more) []  - 0 Hypo / Hyperglycemic Management (close monitor of Blood Glucose) []  - 0 Ankle / Brachial Index (ABI) - do not check if billed separately Has the patient been seen at the hospital within the last three years: Yes Total Score: 70 Level Of Care: New/Established - Level 2 Electronic Signature(s) Signed: 06/18/2023 4:57:37 PM By: Redmond Pulling RN, BSN Entered By: Redmond Pulling on 06/18/2023 11:21:19 -------------------------------------------------------------------------------- Encounter Discharge Information Details Patient Name: Date of Service: Joshua Riley, EDWA RD 06/18/2023 9:30 A M Medical Record Number: [id number] Patient Account Number: [id number] Date of  Birth/Sex: Treating RN: Sep 19, 1935 (87 y.o. Joshua Riley Primary Care Myalee Stengel: Tana Conch Other Clinician: Referring Damyn Weitzel: Treating Reynoldo Mainer/Extender: Jaquelyn Bitter in Treatment: 0 Encounter Discharge Information Items Discharge Condition: Stable Ambulatory Status: Ambulatory Discharge Destination: Home Transportation: Private Auto Accompanied By: daughter Schedule Follow-up Appointment: Yes Clinical Summary of Care: Patient Declined Electronic Signature(s) Signed: 06/18/2023 4:57:37 PM By: Redmond Pulling RN, BSN Entered By: Redmond Pulling on 06/18/2023 11:22:05 -------------------------------------------------------------------------------- Lower Extremity Assessment Details Patient Name: Date of Service: Joshua Riley, EDWA RD 06/18/2023 9:30 A Abbie Sons ([id number]) [id number] Page 4 of 5 Medical Record Number: [id number] Patient Account Number: [id number] Date of Birth/Sex: Treating RN: Mar 13, 1936 (87 y.o. Joshua Riley Primary Care Swain Acree: Tana Conch Other Clinician: Referring Anjolina Byrer: Treating Cree Napoli/Extender: Jaquelyn Bitter in Treatment: 0 Electronic Signature(s) Signed: 06/18/2023 4:57:37 PM By: Redmond Pulling RN, BSN Entered By: Redmond Pulling on 06/18/2023 10:04:15 -------------------------------------------------------------------------------- Multi-Disciplinary Care Plan Details Patient Name: Date of Service: Joshua Riley, EDWA RD 06/18/2023 9:30 A M Medical Record Number: [id number] Patient Account Number: [id number] Date of Birth/Sex: Treating RN: 1936-05-13 (87 y.o. Joshua Riley Primary Care Maguire Killmer: Tana Conch Other Clinician: Referring Eldon Zietlow: Treating Alima Naser/Extender: Jaquelyn Bitter in Treatment: 0 Active Inactive Electronic Signature(s) Signed: 06/18/2023 4:57:37 PM By: Redmond Pulling RN, BSN Entered By: Redmond Pulling on 06/18/2023 11:23:09 -------------------------------------------------------------------------------- Pain Assessment Details Patient Name: Date of Service: Joshua Riley, EDWA RD 06/18/2023 9:30 A M Medical Record Number: [id number] Patient Account Number: [id number] Date of Birth/Sex: Treating RN: [birth year] (87 y.o. Joshua Riley Primary Care Syniah Berne: Tana Conch Other Clinician: Referring Myanna Ziesmer: Treating Rayburn Mundis/Extender: Jaquelyn Bitter in Treatment: 0 Active Problems Location of Pain Severity and Description of Pain Patient Has Paino No Site Locations Rate the pain. Current Pain Level: 5 Worst Pain Level: 8 Least Pain Level: 4 Dauphin, Ramon Dredge ([id number]) [id number] Page 5 of 5 Pain Management and Medication Current Pain Management: Electronic Signature(s) Signed: 06/18/2023 4:57:37 PM By: Redmond Pulling RN, BSN Entered By: Redmond Pulling on 06/18/2023 10:05:21 -------------------------------------------------------------------------------- Patient/Caregiver Education  Details Patient Name: Date of Service: Joshua Riley RD 12/12/2024andnbsp9:30 A M Medical Record Number: [id number] Patient Account Number: [id number] Date of Birth/Gender: Treating RN: 1936-05-22 (87 y.o. Joshua Riley Primary Care Physician: Tana Conch Other Clinician: Referring Physician: Treating Physician/Extender: Jaquelyn Bitter in Treatment: 0 Education Assessment Education Provided To: Patient Education Topics Provided Welcome T [facility]New [facility] Packet: o Methods: Explain/Verbal Responses: State content correctly Electronic Signature(s) Signed: 06/18/2023 4:57:37 PM By: Redmond Pulling RN, BSN Entered By: Redmond Pulling on 06/18/2023 10:12:55 -------------------------------------------------------------------------------- Vitals Details Patient Name: Date of Service: Joshua Riley, EDWA RD 06/18/2023 9:30 A M Medical Record Number: [id number] Patient Account Number: [id number] Date of Birth/Sex: Treating RN: 03-19-1936 (87 y.o. M) Primary Care Aerilyn Slee: Tana Conch Other Clinician: Referring Jamilya Sarrazin: Treating Annasofia Pohl/Extender: Jaquelyn Bitter in Treatment: 0 Vital Signs Time Taken: 09:35 Pulse (bpm): 90 Height (in): 67 Respiratory Rate (breaths/min): 20 Weight (lbs): 165 Blood Pressure (mmHg): 114/45 Body Mass Index (BMI): 25.8 Reference Range: 80 - 120 mg / dl Electronic Signature(s) Signed: 06/18/2023 4:57:37 PM By: Redmond Pulling RN, BSN Entered By: Redmond Pulling on 06/18/2023 09:53:21

## 2023-06-18 ENCOUNTER — Encounter: Payer: Self-pay | Admitting: Family Medicine

## 2023-06-18 ENCOUNTER — Encounter (HOSPITAL_BASED_OUTPATIENT_CLINIC_OR_DEPARTMENT_OTHER): Payer: Medicare Other | Attending: Internal Medicine | Admitting: Internal Medicine

## 2023-06-18 ENCOUNTER — Telehealth: Payer: Self-pay | Admitting: Family Medicine

## 2023-06-18 DIAGNOSIS — I480 Paroxysmal atrial fibrillation: Secondary | ICD-10-CM | POA: Insufficient documentation

## 2023-06-18 DIAGNOSIS — Z932 Ileostomy status: Secondary | ICD-10-CM | POA: Insufficient documentation

## 2023-06-18 DIAGNOSIS — K51911 Ulcerative colitis, unspecified with rectal bleeding: Secondary | ICD-10-CM | POA: Diagnosis not present

## 2023-06-18 DIAGNOSIS — Z7901 Long term (current) use of anticoagulants: Secondary | ICD-10-CM | POA: Diagnosis not present

## 2023-06-18 DIAGNOSIS — Z992 Dependence on renal dialysis: Secondary | ICD-10-CM | POA: Insufficient documentation

## 2023-06-18 DIAGNOSIS — K601 Chronic anal fissure: Secondary | ICD-10-CM | POA: Diagnosis present

## 2023-06-18 DIAGNOSIS — N186 End stage renal disease: Secondary | ICD-10-CM | POA: Insufficient documentation

## 2023-06-18 DIAGNOSIS — Z86718 Personal history of other venous thrombosis and embolism: Secondary | ICD-10-CM | POA: Diagnosis not present

## 2023-06-18 NOTE — Telephone Encounter (Signed)
Patients daughter stopped by and is asking if provider can put in a Gastro referral for patient. She states woundcare recommended a gastro doctor, she went and they said they could not make an appt unless records from [place] were sent. She states he has never seen a gastro in [place]. Please call [phone] for further information

## 2023-06-18 NOTE — Telephone Encounter (Signed)
Same info sent via mychart to Dr. Durene Cal.

## 2023-06-19 NOTE — Progress Notes (Signed)
SEIKO, OUYANG ([id number]) [id number] Page 1 of 4 Visit Report for 06/18/2023 Abuse Risk Screen Details Patient Name: Date of Service: Joshua Riley RD 06/18/2023 9:30 A M Medical Record Number: [id number] Patient Account Number: [id number] Date of Birth/Sex: Treating RN: 1935/09/24 (87 y.o. [name] Primary Care Kymberli Wiegand: Tana Conch Other Clinician: Referring Joban Colledge: Treating Gayanne Prescott/Extender: Jaquelyn Bitter in Treatment: 0 Abuse Risk Screen Items Answer ABUSE RISK SCREEN: Has anyone close to you tried to hurt or harm you recentlyo No Do you feel uncomfortable with anyone in your familyo No Has anyone forced you do things that you didnt want to doo No Electronic Signature(s) Signed: 06/18/2023 4:57:37 PM By: Redmond Pulling RN, BSN Entered By: Redmond Pulling on 06/18/2023 10:01:40 -------------------------------------------------------------------------------- Activities of Daily Living Details Patient Name: Date of Service: Joshua Riley RD 06/18/2023 9:30 A M Medical Record Number: [id number] Patient Account Number: [id number] Date of Birth/Sex: Treating RN: [birth year] (87 y.o. [name] Primary Care Molley Houser: Tana Conch Other Clinician: Referring Glenn Gullickson: Treating Reema Chick/Extender: Jaquelyn Bitter in Treatment: 0 Activities of Daily Living Items Answer Activities of Daily Living (Please select one for each item) Drive Automobile Completely Able T Medications ake Completely Able Use T elephone Completely Able Care for Appearance Completely Able Use T oilet Completely Able Bath / Shower Completely Able Dress Self Completely Able Feed Self Completely Able Walk Completely Able Get In / Out Bed Completely Able Housework Completely Able Prepare Meals Completely Able Handle Money Completely Able Shop for Self Completely Able Electronic Signature(s) Signed:  06/18/2023 4:57:37 PM By: Redmond Pulling RN, BSN Entered By: Redmond Pulling on 06/18/2023 10:02:16 Jeanmarie Hubert ([id number]) [id number] Page 2 of 4 -------------------------------------------------------------------------------- Education Screening Details Patient Name: Date of Service: Joshua Riley RD 06/18/2023 9:30 A M Medical Record Number: [id number] Patient Account Number: [id number] Date of Birth/Sex: Treating RN: 01-10-1936 (87 y.o. [name] Primary Care Rozann Holts: Tana Conch Other Clinician: Referring Daleon Willinger: Treating Nathaneil Feagans/Extender: Jaquelyn Bitter in Treatment: 0 Learning Preferences/Education Level/Primary Language Learning Preference: Explanation, Demonstration, Printed Material Preferred Language: English Cognitive Barrier Language Barrier: No Translator Needed: No Memory Deficit: No Emotional Barrier: No Cultural/Religious Beliefs Affecting Medical Care: No Physical Barrier Impaired Vision: No Impaired Hearing: No Decreased Hand dexterity: No Knowledge/Comprehension Knowledge Level: High Comprehension Level: High Ability to understand written instructions: High Ability to understand verbal instructions: High Motivation Anxiety Level: Calm Cooperation: Cooperative Education Importance: Acknowledges Need Interest in Health Problems: Asks Questions Perception: Coherent Willingness to Engage in Self-Management High Activities: Readiness to Engage in Self-Management High Activities: Electronic Signature(s) Signed: 06/18/2023 4:57:37 PM By: Redmond Pulling RN, BSN Entered By: Redmond Pulling on 06/18/2023 10:02:45 -------------------------------------------------------------------------------- Fall Risk Assessment Details Patient Name: Date of Service: Sherlean Foot, EDWA RD 06/18/2023 9:30 A M Medical Record Number: [id number] Patient Account Number: [id number] Date of Birth/Sex:  Treating RN: Sep 13, 1935 (87 y.o. [name] Primary Care Avamae Dehaan: Tana Conch Other Clinician: Referring Emonee Winkowski: Treating Shali Vesey/Extender: Jaquelyn Bitter in Treatment: 0 Fall Risk Assessment Items Have you had 2 or more falls in the last 12 monthso 0 No Have you had any fall that resulted in injury in the last 12 monthso 0 No [address] CHANOCH, FECHTER ([id number]) [phone] [id number] Page 3 of 4 History of falling - immediate or within 3 months 0 No Secondary diagnosis (Do you have 2 or more medical diagnoseso) 15 Yes Ambulatory aid None/bed rest/wheelchair/nurse 0 Yes Crutches/cane/walker 0 No Furniture 0  No Intravenous therapy Access/Saline/Heparin Lock 0 No Gait/Transferring Normal/ bed rest/ wheelchair 0 Yes Weak (short steps with or without shuffle, stooped but able to lift head while walking, may seek 0 No support from furniture) Impaired (short steps with shuffle, may have difficulty arising from chair, head down, impaired 0 No balance) Mental Status Oriented to own ability 0 Yes Electronic Signature(s) Signed: 06/18/2023 4:57:37 PM By: Redmond Pulling RN, BSN Entered By: Redmond Pulling on 06/18/2023 10:03:17 -------------------------------------------------------------------------------- Foot Assessment Details Patient Name: Date of Service: Sherlean Foot, EDWA RD 06/18/2023 9:30 A M Medical Record Number: [id number] Patient Account Number: [id number] Date of Birth/Sex: Treating RN: 18-Aug-1935 (87 y.o. [name] Primary Care Tira Lafferty: Tana Conch Other Clinician: Referring Capricia Serda: Treating Harbert Fitterer/Extender: Jaquelyn Bitter in Treatment: 0 Foot Assessment Items Site Locations + = Sensation present, - = Sensation absent, C = Callus, U = Ulcer R = Redness, W = Warmth, M = Maceration, PU = Pre-ulcerative lesion F = Fissure, S = Swelling, D =  Dryness Assessment Right: Left: Other Deformity: No No Prior Foot Ulcer: No No Prior Amputation: No No Charcot Joint: No No Ambulatory Status: Ambulatory Without Help GaitJEET, HEARING ([id number]) [phone] [id number] Page 4 of 4 Electronic Signature(s) Signed: 06/18/2023 4:57:37 PM By: Redmond Pulling RN, BSN Entered By: Redmond Pulling on 06/18/2023 10:04:03 -------------------------------------------------------------------------------- Nutrition Risk Screening Details Patient Name: Date of Service: Joshua Riley RD 06/18/2023 9:30 A M Medical Record Number: [id number] Patient Account Number: [id number] Date of Birth/Sex: Treating RN: Nov 07, 1935 (87 y.o. [name] Primary Care Shavonn Convey: Tana Conch Other Clinician: Referring Presley Summerlin: Treating Karita Dralle/Extender: Jaquelyn Bitter in Treatment: 0 Height (in): 67 Weight (lbs): 165 Body Mass Index (BMI): 25.8 Nutrition Risk Screening Items Score Screening NUTRITION RISK SCREEN: I have an illness or condition that made me change the kind and/or amount of food I eat 2 Yes I eat fewer than two meals per day 0 No I eat few fruits and vegetables, or milk products 0 No I have three or more drinks of beer, liquor or wine almost every day 0 No I have tooth or mouth problems that make it hard for me to eat 0 No I don't always have enough money to buy the food I need 0 No I eat alone most of the time 1 Yes I take three or more different prescribed or over-the-counter drugs a day 1 Yes Without wanting to, I have lost or gained 10 pounds in the last six months 0 No I am not always physically able to shop, cook and/or feed myself 0 No Nutrition Protocols Good Risk Protocol Moderate Risk Protocol High Risk Proctocol Risk Level: Moderate Risk Score: 4 Electronic Signature(s) Signed: 06/18/2023 4:57:37 PM By: Redmond Pulling RN, BSN Entered By: Redmond Pulling on  06/18/2023 10:03:52

## 2023-06-24 NOTE — Progress Notes (Signed)
RAMEL, RODELA ([id number]) [id number] Page 1 of 7 Visit Report for 06/18/2023 Chief Complaint Document Details Patient Name: Date of Service: Joshua Riley RD 06/18/2023 9:30 A M Medical Record Number: [id number] Patient Account Number: [id number] Date of Birth/Sex: Treating RN: 1935/12/15 (87 y.o. M) Primary Care Provider: Tana Conch Other Clinician: Referring Provider: Treating Provider/Extender: Jaquelyn Bitter in Treatment: 0 Information Obtained from: Patient Chief Complaint 06/18/2023; patient is here for evaluation of a bleeding rectal wound. Apparently directed here by somebody at [employer]) Signed: 06/18/2023 5:17:16 PM By: Baltazar Najjar MD Entered By: Baltazar Najjar on 06/18/2023 09:53:44 -------------------------------------------------------------------------------- HPI Details Patient Name: Date of Service: Joshua Riley, EDWA RD 06/18/2023 9:30 A M Medical Record Number: [id number] Patient Account Number: [id number] Date of Birth/Sex: Treating RN: 04-14-1936 (87 y.o. M) Primary Care Provider: Tana Conch Other Clinician: Referring Provider: Treating Provider/Extender: Jaquelyn Bitter in Treatment: 0 History of Present Illness HPI Description: ADMISSION 06/18/2023 This is a surgically complex man who has longstanding ulcerative colitis. He also has a history of DVT on Eliquis. Over the last 6 months he says he has had fairly marked rectal bleeding the cause of which is not certain to him. Apparently and calling for help at [facility] he was directed here as he actually lives in [place]. He is using simply pads to protect his close. He does not complain of any pain. No nausea no vomiting. The patient states for the last 6 months he has had continuous rectal bleeding. He does not complain of pain, nausea vomiting or fever. He says he feels well eats and drinks well. He is  on Eliquis for DVT and/or paroxysmal A-fib. There is no mucus but sometimes yellowish drainage. The patient underwent a total colectomy with an IPAA for chronic ulcerative colitis in 2011. Apparently he developed ureteral injury while in surgery and since then he has been on dialysis. 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 Past medical history includes end-stage renal disease on dialysis, DVT on Eliquis, paroxysmal atrial fibrillation, perirectal abscess drained earlier this year. Culture of abscess grew ESBL he has completed antibiotics. Multiple small bowel obstructions Electronic Signature(s) Signed: 06/18/2023 5:17:16 PM By: Baltazar Najjar MD Entered By: Baltazar Najjar on 06/18/2023 09:58:18 Joshua Riley ([id number]) [id number] Page 2 of 7 -------------------------------------------------------------------------------- Physical Exam Details Patient Name: Date of Service: Joshua Riley RD 06/18/2023 9:30 A M Medical Record Number: [id number] Patient Account Number: [id number] Date of Birth/Sex: Treating RN: 14-Jan-1936 (87 y.o. M) Primary Care Provider: Tana Conch Other Clinician: Referring Provider: Treating Provider/Extender: Jaquelyn Bitter in Treatment: 0 Constitutional Sitting or standing Blood Pressure is within target range for patient.. Pulse regular and within target range for patient.Marland Kitchen Respirations regular, non-labored and within target range.. Temperature is normal and within the target range for the patient.Marland Kitchen Appears in no distress. Gastrointestinal (GI) . Notes He has an ileostomy bag in the right quadrant. And urostomy tube on the left otherwise abdomen is soft no masses noted wound exam there is no open wound. There is no open wounds on his buttock. His perineum and scrotum were carefully inspected there is nothing bleeding here. I attempted inspection of his  anal area. The skin looks fairly normal. He did have serosanguineous drainage on his incontinence brief which I am assuming is what he is describing. Probing this leads me to believe that the area is oversewn at the time perhaps of his  IPAA reversal. At the top of this area under illumination there is either a fistula or a fissure although I could see no obvious bleeding. This was not an easy examination. Electronic Signature(s) Signed: 06/18/2023 5:17:16 PM By: Baltazar Najjar MD Entered By: Baltazar Najjar on 06/18/2023 10:01:08 -------------------------------------------------------------------------------- Physician Orders Details Patient Name: Date of Service: Joshua Riley, EDWA RD 06/18/2023 9:30 A M Medical Record Number: [id number] Patient Account Number: [id number] Date of Birth/Sex: Treating RN: Dec 03, 1935 (87 y.o. [name] Primary Care Provider: Tana Conch Other Clinician: Referring Provider: Treating Provider/Extender: Jaquelyn Bitter in Treatment: 0 Verbal / Phone Orders: No Diagnosis Coding Discharge From [facility] Services Discharge from [facility] - please consult gastroenterologist - ask if they may have a pediatric proctoscope to visualize area of concern. Off-Loading Turn and reposition every 2 hours - Watch pressue to sacral area. Do not stay on back for longer than 2 hrs. Electronic Signature(s) Signed: 06/18/2023 4:57:37 PM By: Redmond Pulling RN, BSN Signed: 06/18/2023 5:17:16 PM By: Baltazar Najjar MD Entered By: Redmond Pulling on 06/18/2023 07:36:08 Problem List Details -------------------------------------------------------------------------------- Joshua Riley ([id number]) [id number] Page 3 of 7 Patient Name: Date of Service: Joshua Riley RD 06/18/2023 9:30 A M Medical Record Number: [id number] Patient Account Number: [id number] Date of Birth/Sex: Treating RN: 1936/05/10 (87 y.o. 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 M) Primary Care Provider: Tana Conch Other Clinician: Referring Provider: Treating Provider/Extender: Jaquelyn Bitter in Treatment: 0 Subjective Chief Complaint Information obtained from Patient 06/18/2023; patient is here for evaluation of a bleeding rectal wound. Apparently directed here by somebody at [facility] History of Present Illness (HPI) ADMISSION 06/18/2023 This is a surgically complex man who has longstanding ulcerative colitis. He also has a history of DVT on Eliquis. Over the last 6 months he says he has had fairly marked rectal bleeding the cause of which is not certain to him. Apparently and calling for help at [facility] he was directed here as he actually lives in [place]. He is using simply pads to protect his close. He does not complain of any pain. No nausea no vomiting. The patient states for the last 6 months he has had continuous rectal bleeding. He does not complain of pain, nausea vomiting or fever. He says he feels well eats and drinks well. He is on Eliquis for DVT and/or paroxysmal A-fib. There is no mucus but sometimes yellowish  drainage. The patient underwent a total colectomy with an IPAA for chronic ulcerative colitis in 2011. Apparently he developed ureteral injury while in surgery and since then he has been on dialysis. 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 Past medical history includes end-stage renal disease on dialysis, DVT on Eliquis, paroxysmal atrial fibrillation, perirectal abscess drained earlier this year. Culture of abscess grew ESBL he has completed antibiotics. Multiple small bowel obstructions Patient History Information obtained from Patient,  Chart. Allergies Cephalosporins, ciprofloxacin, baclofen Family History Joshua Riley, Joshua Riley ([id number]) [id number] Page 4 of 7 Cancer - Mother,Father,Siblings, Lung Disease - Siblings, No family history of Diabetes, Heart Disease, Hypertension, Kidney Disease, Seizures, Stroke, Thyroid Problems. Social History Former smoker - quit 30 yrs ago or more - ended on 07/08/1983, Marital Status - Widowed, Alcohol Use - Moderate, Drug Use - No History, Caffeine Use - Daily. Medical History Eyes Patient has history of Glaucoma Respiratory Patient has history of Asthma, Sleep Apnea - no machine Cardiovascular Patient has history of Arrhythmia - afibb; on eliquis Gastrointestinal Patient has history of Colitis Genitourinary Patient has history of End Stage Renal Disease Neurologic Patient has history of Neuropathy Hospitalization/Surgery History - left heart cath and coronary angiography. - ir nephrostomy exchange left. 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 Ear/Nose/Mouth/Throat Denies complaints or symptoms of Chronic sinus problems or rhinitis. Endocrine Denies complaints or symptoms of Heat/cold intolerance. Integumentary (Skin) Complains or has symptoms of Wounds. Musculoskeletal Denies complaints or symptoms of Muscle Pain, Muscle Weakness. Neurologic Denies complaints or symptoms of Numbness/parasthesias. Psychiatric Denies complaints or symptoms of Claustrophobia. Objective Constitutional Sitting or standing Blood Pressure is within target range for patient.. Pulse regular and within target range for patient.Marland Kitchen Respirations regular, non-labored and within target range.. Temperature is normal and within the target range for the patient.Marland Kitchen Appears in no distress. Vitals Time Taken: 9:35 AM, Height: 67 in, Weight: 165 lbs, BMI: 25.8, Pulse: 90 bpm, Respiratory Rate: 20 breaths/min, Blood Pressure: 114/45 mmHg. General Notes: He has an ileostomy bag in the right quadrant. And urostomy tube on the left otherwise abdomen is soft no masses noted wound exam there is no open wound. There is no open wounds on his buttock. His perineum and scrotum were carefully inspected there is nothing bleeding here. I attempted inspection of his anal area. The skin looks fairly normal. He did have serosanguineous drainage on his incontinence brief which I am assuming is what he is describing. Probing this leads me to believe that the area is oversewn at the time perhaps of his IPAA reversal. At the top of this area under illumination there is either a fistula or a fissure although I could see no obvious bleeding. This was not an easy examination. Assessment Active Problems ICD-10 Chronic anal fissure Ulcerative colitis, unspecified with rectal bleeding Plan Joshua Riley, Joshua Riley ([id number]) [id number] Page 5 of 7 Discharge From [facility] Services: Discharge from Wound Care Center - please consult gastroenterologist - ask if  they may have a pediatric proctoscope to visualize area of concern. Off-Loading: Turn and reposition every 2 hours - Watch pressue to sacral area. Do not stay on back for longer than 2 hrs. 1. This man came to our clinic apparently directed by somebody at [facility] for evaluation of a bleeding wound. I could not identify any source of bleeding. 2. The patient was convinced this was coming from his perianal area I could not prove or disprove this but what I could see of the mucosa here looks normal except for at the very top of hiso Oversewn anus there was an area which may represent a fistula or perhaps a fissure. Nevertheless I could not identify any bleeding. 3. Earlier this year the patient was felt to have bleeding from this area  in relation to a sinus tract connecting with a pelvic fluid collection. This was drained and he received antibiotics nevertheless the bleeding continues to be the source of concern to the patient. 4. There is nothing here I can help this patient with. In terms of looking at this area properly I would wonder about a pediatric proctoscope. I certainly do not have this available here. Electronic Signature(s) Signed: 06/18/2023 5:17:16 PM By: Baltazar Najjar MD Entered By: Baltazar Najjar on 06/18/2023 10:03:26 -------------------------------------------------------------------------------- HxROS Details Patient Name: Date of Service: Joshua Riley, EDWA RD 06/18/2023 9:30 A M Medical Record Number: [id number] Patient Account Number: [id number] Date of Birth/Sex: Treating RN: June 04, 1936 (87 y.o. Lucious Groves Primary Care Provider: Tana Conch Other Clinician: Referring Provider: Treating Provider/Extender: Jaquelyn Bitter in Treatment: 0 Information Obtained From Patient Chart Constitutional Symptoms (General Health) Complaints and Symptoms: Negative for: Fatigue; Fever; Chills; Marked Weight Change Ear/Nose/Mouth/Throat Complaints and  Symptoms: Negative for: Chronic sinus problems or rhinitis Endocrine Complaints and Symptoms: Negative for: Heat/cold intolerance Integumentary (Skin) Complaints and Symptoms: Positive for: Wounds Musculoskeletal Complaints and Symptoms: Negative for: Muscle Pain; Muscle Weakness Neurologic Complaints and Symptoms: Negative for: Numbness/parasthesias Medical History: Positive for: Neuropathy Psychiatric Complaints and Symptoms: Negative for: Joshua Riley, Joshua Riley ([id number]) [id number] Page 6 of 7 Medical History: Past Medical History Notes: depression Eyes Medical History: Positive for: Glaucoma Hematologic/Lymphatic Respiratory Medical History: Positive for: Asthma; Sleep Apnea - no machine Cardiovascular Medical History: Positive for: Arrhythmia - afibb; on eliquis Past Medical History Notes: orthostatic hypotension Gastrointestinal Medical History: Positive for: Colitis Past Medical History Notes: ileostomy in place Genitourinary Medical History: Positive for: End Stage Renal Disease Past Medical History Notes: hydronephrosis managed with a PCN; obstructive uropathy; ureteral stricture Dialysis, MWF Immunological Oncologic HBO Extended History Items Eyes: Glaucoma Immunizations Pneumococcal Vaccine: Received Pneumococcal Vaccination: No Implantable Devices None Hospitalization / Surgery History Type of Hospitalization/Surgery left heart cath and coronary angiography ir nephrostomy exchange left Family and Social History Cancer: Yes - Mother,Father,Siblings; Diabetes: No; Heart Disease: No; Hypertension: No; Kidney Disease: No; Lung Disease: Yes - Siblings; Seizures: No; Stroke: No; Thyroid Problems: No; Former smoker - quit 30 yrs ago or more - ended on 07/08/1983; Marital Status - Widowed; Alcohol Use: Moderate; Drug Use: No History; Caffeine Use: Daily; Financial Concerns: No; Food, Clothing or Shelter Needs:  No; Support System Lacking: No; Transportation Concerns: No Product manager) Signed: 06/18/2023 4:57:37 PM By: Redmond Pulling RN, BSN Signed: 06/18/2023 5:17:16 PM By: Baltazar Najjar MD Signed: 06/23/2023 4:32:11 PM By: Fonnie Mu RN Entered By: Redmond Pulling on 06/18/2023 07:01:12 Joshua Riley ([id number]) [id number] Page 7 of 7 -------------------------------------------------------------------------------- SuperBill Details Patient Name: Date of Service: Joshua Riley RD 06/18/2023 Medical Record Number: [id number] Patient Account Number: [id number] Date of Birth/Sex: Treating RN: 1935-12-28 (87 y.o. 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

## 2023-06-25 ENCOUNTER — Other Ambulatory Visit (HOSPITAL_COMMUNITY): Payer: Self-pay | Admitting: Interventional Radiology

## 2023-06-25 ENCOUNTER — Ambulatory Visit (HOSPITAL_COMMUNITY)
Admission: RE | Admit: 2023-06-25 | Discharge: 2023-06-25 | Disposition: A | Discharge status: Home / Self Care | Payer: Medicare Other | Source: Ambulatory Visit | Attending: Interventional Radiology | Admitting: Interventional Radiology

## 2023-06-25 ENCOUNTER — Telehealth: Payer: Self-pay | Admitting: Family Medicine

## 2023-06-25 DIAGNOSIS — N1339 Other hydronephrosis: Secondary | ICD-10-CM

## 2023-06-25 DIAGNOSIS — N1330 Unspecified hydronephrosis: Secondary | ICD-10-CM | POA: Diagnosis not present

## 2023-06-25 DIAGNOSIS — Z436 Encounter for attention to other artificial openings of urinary tract: Secondary | ICD-10-CM | POA: Insufficient documentation

## 2023-06-25 HISTORY — PX: IR NEPHROSTOMY EXCHANGE LEFT: IMG6069

## 2023-06-25 MED ORDER — IOHEXOL 300 MG/ML  SOLN
50.0000 mL | Freq: Once | INTRAMUSCULAR | Status: AC | PRN
Start: 2023-06-25 — End: 2023-06-25
  Administered 2023-06-25: 10 mL

## 2023-06-25 MED ORDER — LIDOCAINE HCL 1 % IJ SOLN
INTRAMUSCULAR | Status: AC
  Filled 2023-06-25: qty 20

## 2023-06-25 NOTE — Telephone Encounter (Signed)
Please schedule ov for this for pt to be evaluated.

## 2023-06-25 NOTE — Telephone Encounter (Signed)
Patient is wanting a call back regarding a boil he has in his groin area he wanted to know if he needed to come in are can he get some medication called in for it

## 2023-06-26 ENCOUNTER — Encounter: Payer: Self-pay | Admitting: Family

## 2023-06-26 ENCOUNTER — Telehealth: Payer: Self-pay | Admitting: Primary Care

## 2023-06-26 ENCOUNTER — Ambulatory Visit (INDEPENDENT_AMBULATORY_CARE_PROVIDER_SITE_OTHER): Payer: Medicare Other | Admitting: Family

## 2023-06-26 VITALS — BP 88/57 | HR 98 | Temp 97.5°F | Ht 65.0 in | Wt 142.6 lb

## 2023-06-26 DIAGNOSIS — L02214 Cutaneous abscess of groin: Secondary | ICD-10-CM

## 2023-06-26 MED ORDER — TRAMADOL HCL 50 MG PO TABS: 50.0000 mg | ORAL_TABLET | Freq: Three times a day (TID) | ORAL | 0 refills | Status: DC | PRN

## 2023-06-26 MED ORDER — DOXYCYCLINE HYCLATE 100 MG PO TABS: 100.0000 mg | ORAL_TABLET | Freq: Two times a day (BID) | ORAL | 0 refills | Status: AC

## 2023-06-26 NOTE — Progress Notes (Signed)
Patient ID: Joshua Riley, male    DOB: [birth year], 87 y.o.   MRN: [id number]  Chief Complaint  Patient presents with   Recurrent Skin Infections    Pt c/o boil in groin area, present for about a week. Has tried OTC boil removal.    Discussed the use of AI scribe software for clinical note transcription with the patient, who gave verbal consent to proceed.  History of Present Illness   The patient, with a history of end-stage renal disease on dialysis, presents with low blood pressure and a painful boil in the groin area. He reports that his blood pressure is usually low, but not as low as it is currently. He takes midodrine three times a day to manage his blood pressure, and has already taken two doses today. He reports feeling lightheaded and dizzy after dialysis, which he is accustomed to and has every MWF.  In addition to his blood pressure concerns, he has a painful boil in his left groin area. He has had boils before, but not as severe as the current one. He has tried over-the-counter ointment for relief, but it has not been effective. The boil has been causing him discomfort for over a week.     Assessment & Plan:     Hypotension - On Midodrine 5mg  TID for chronic hypotension. Blood pressure low today, but patient has already taken two doses of Midodrine and been to dialysis today. Pt denies dizziness or feeling faint or too fatigued more than usual.  -Continue Midodrine as directed.  Groin Abscess - Painful, unopened abscess in the left groin area, tender, erythema, slightly raised, approx. 3cm in diameter noted. Patient has tried over-the-counter topical treatments without success. Pt wanting abscess drained, advised pt I am not comfortable doing this d/t his many medical problems and also taking Eliquis.  -Sending Doxycyline 100mg  bid x 10d. -Refilling Tramadol for pain relief, 50mg  tid prn, reminded pt of SE. -Advised patient to clean the area with soap and water daily, can  apply heat for 15 min tid. -Sending referral to surgery to see if they can help if antibiotics do not work. -If no improvement with antibiotic, or the pain worsens, advised pt to go to the ED.    Subjective:    Outpatient Medications Prior to Visit  Medication Sig Dispense Refill   albuterol (PROVENTIL) (2.5 MG/3ML) 0.083% nebulizer solution Take 3 mLs (2.5 mg total) by nebulization every 6 (six) hours as needed for wheezing or shortness of breath. 75 mL 12   budesonide (PULMICORT) 0.25 MG/2ML nebulizer solution Take 2 mLs (0.25 mg total) by nebulization in the morning and at bedtime. 60 mL 2   calcitRIOL (ROCALTROL) 0.25 MCG capsule Take 1 mcg by mouth daily.     Cyanocobalamin (B-12) 1000 MCG SUBL Place 1 tablet under the tongue daily at 6 (six) AM. 90 tablet 3   cyanocobalamin (VITAMIN B12) 1000 MCG tablet Take 1,000 mcg by mouth daily.     ELIQUIS 2.5 MG TABS tablet Take 1 tablet (2.5 mg total) by mouth 2 (two) times daily. 60 tablet 11   latanoprost (XALATAN) 0.005 % ophthalmic solution Place 1 drop into the right eye at bedtime.     mesalamine (LIALDA) 1.2 g EC tablet Take 2.4 g by mouth daily.     midodrine (PROAMATINE) 10 MG tablet Take 1 tablet (10 mg total) by mouth 3 (three) times daily. Takes 5 mg plus 10mg  for 15 mg total three times a day- started  by nephrology in [place] 270 tablet 3   midodrine (PROAMATINE) 5 MG tablet TAKE 1 TABLET 3 TIMES DAILY WITH MEALS. TAKES 5 MG PLUS 10MG  FOR 15 MG TOTAL THREE TIMES A DAY- STARTED BY NEPHROLOGY IN [place] 270 tablet 3   ramelteon (ROZEREM) 8 MG tablet Take 1 tablet (8 mg total) by mouth at bedtime. 30 tablet 5   sertraline (ZOLOFT) 25 MG tablet Take 1 tablet (25 mg total) by mouth daily. 30 tablet 3   sevelamer carbonate (RENVELA) 800 MG tablet Take 2,400 mg by mouth 3 (three) times daily with meals.     sodium zirconium cyclosilicate (LOKELMA) 10 g PACK packet Take 10 g by mouth 4 (four) times a week. Non Dialysis days- Tues, Thurs,  Saturday, Sunday     timolol (TIMOPTIC) 0.5 % ophthalmic solution Place 1 drop into both eyes daily.     traMADol (ULTRAM) 50 MG tablet Take 50 mg by mouth every 8 (eight) hours as needed (prn pain).     No facility-administered medications prior to visit.   Past Medical History:  Diagnosis Date   A-fib (HCC)    Anemia    Arthritis    Cancer (HCC)    Basal cell   COVID-19    2021   Dyspnea    Dysrhythmia    Afib-controlled on eliquis   ESRD (end stage renal disease) (HCC) 10/22/2021   Glaucoma 11/18/2021   History of DVT (deep vein thrombosis)    Hydronephrosis    managed wtih a PCN   Idiopathic neuropathy 10/22/2021   lyrica    Ileostomy in place (HCC)    Obstructive uropathy    With chronic left nephrostomy   Old retinal detachment, total or subtotal    Orthostatic hypotension 10/22/2021   Sleep apnea    does not need a machine   Stroke (HCC)    Ulcerative colitis (HCC)    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   Past Surgical History:  Procedure Laterality Date   BASAL CELL CARCINOMA EXCISION     10 /23   COLON SURGERY     creation of j pouch     and subsequent takedown of j pouch   EYE SURGERY     IR NEPHROSTOMY EXCHANGE LEFT  12/10/2021   IR NEPHROSTOMY EXCHANGE LEFT  04/22/2022   IR NEPHROSTOMY EXCHANGE LEFT  07/29/2022   IR NEPHROSTOMY EXCHANGE LEFT  10/28/2022   IR NEPHROSTOMY EXCHANGE LEFT  02/05/2023   IR NEPHROSTOMY EXCHANGE LEFT  05/07/2023   IR NEPHROSTOMY EXCHANGE LEFT  06/25/2023   LEFT HEART CATH AND CORONARY ANGIOGRAPHY N/A 07/22/2022   Procedure: LEFT HEART CATH AND CORONARY ANGIOGRAPHY;  Surgeon: [place]az[place], Peter M, MD;  Location: [facility] INVASIVE CV LAB;  Service: Cardiovascular;  Laterality: N/A;   REVISION OF ARTERIOVENOUS GORETEX GRAFT Left 05/06/2022   Procedure: REDO LEFT THIGH ARTERIOVENOUS 4-7 MM GORETEX GRAFT;  Surgeon: Chuck Hint, MD;  Location: [facility] OR;  Service: Vascular;  Laterality: Left;   SMALL INTESTINE SURGERY      TOTAL COLECTOMY     Allergies  Allergen Reactions   Cephalosporins Rash   Ciprofloxacin Itching and Rash   Baclofen Other (See Comments)    Altered mental status, after accidental overdose        Objective:    Physical Exam Vitals and nursing note reviewed.  Constitutional:      General: He is not in acute distress.    Appearance: Normal appearance.  HENT:  Head: Normocephalic.  Cardiovascular:     Rate and Rhythm: Normal rate and regular rhythm.  Pulmonary:     Effort: Pulmonary effort is normal.     Breath sounds: Normal breath sounds.  Musculoskeletal:        General: Normal range of motion.     Cervical back: Normal range of motion.  Skin:    General: Skin is warm and dry.     Findings: Abscess (left groin, approx 3cm raised w/erythema, not draining, very tender to palpation) present.  Neurological:     Mental Status: He is alert and oriented to person, place, and time.  Psychiatric:        Mood and Affect: Mood normal.    BP (!) 88/57 (BP Location: Left Arm, Patient Position: Sitting, Cuff Size: Large)   Pulse 98   Temp (!) 97.5 F (36.4 C) (Temporal)   Ht 5\' 5"  (1.651 m)   Wt 142 lb 9.6 oz (64.7 kg)   SpO2 98%   BMI 23.73 kg/m  Wt Readings from Last 3 Encounters:  06/26/23 142 lb 9.6 oz (64.7 kg)  06/09/23 147 lb 12.8 oz (67 kg)  06/09/23 147 lb 12.8 oz (67 kg)      Dulce Sellar, NP

## 2023-06-26 NOTE — Telephone Encounter (Signed)
Nebulizer and inhaler prescribed are not working for the patient(see last encounter). Patient would like another alternative. Please call and advise [phone]   Ph[facility] on [address] [place]

## 2023-06-26 NOTE — Telephone Encounter (Signed)
12/11/ Patient was switched from inhalers to nebs. Patient does not feeling like nebs are any better than inhaler. Please advise.

## 2023-06-27 ENCOUNTER — Other Ambulatory Visit: Payer: Self-pay | Admitting: Primary Care

## 2023-06-29 ENCOUNTER — Other Ambulatory Visit: Payer: Self-pay | Admitting: Primary Care

## 2023-06-29 MED ORDER — BUDESONIDE 0.5 MG/2ML IN SUSP: 0.5000 mg | Freq: Two times a day (BID) | RESPIRATORY_TRACT | 1 refills | Status: DC

## 2023-06-29 NOTE — Telephone Encounter (Signed)
I will increase Pulmicort dose to 0.5mg /45mL morning and evening; he should be using albuterol nebulizer twice a day as well

## 2023-06-30 ENCOUNTER — Other Ambulatory Visit (HOSPITAL_COMMUNITY): Payer: Self-pay

## 2023-06-30 ENCOUNTER — Telehealth: Payer: Self-pay

## 2023-06-30 NOTE — Telephone Encounter (Signed)
Pharmacy Patient Advocate Encounter   Received notification from RX Request Messages that prior authorization for Albuterol Sulfate (2.5 MG/3ML)0.083% nebulizer solution is required/requested.   Insurance verification completed.   The patient is insured through [facility].   Per test claim: PA required; PA submitted to above mentioned insurance via CoverMyMeds Key/confirmation #/[id number] Status is pending

## 2023-06-30 NOTE — Telephone Encounter (Signed)
Pharmacy Patient Advocate Encounter  Received notification from [facility] that Prior Authorization for Albuterol Sulfate (2.5 MG/3ML)0.083% nebulizer solution has been DENIED.  Full denial letter will be uploaded to the media tab. See denial reason below.  We denied this request under Medicare Part D because: Drugs used in a nebulizer at your home are covered under Medicare Part B. Since you ae using this medication with a nebulizer in your home, it is covered under Medicare Part B per Section 1861(n) of the Social Security Act. Your Medicare Part D drug plan cannot cover a drug already covered by Medicare Part B. Please ask your pharmacist or prescriber to bill Medicare Part B for coverage of this drug.  PA #/Case ID/Reference #: [id number]

## 2023-06-30 NOTE — Telephone Encounter (Signed)
PA request has been Submitted. New Encounter created for follow up. For additional info see Pharmacy Prior Auth telephone encounter from 06/30/2023.

## 2023-06-30 NOTE — Telephone Encounter (Signed)
PA request has been Denied. New Encounter created for follow up. For additional info see Pharmacy Prior Auth telephone encounter from 06-30-2023.

## 2023-07-02 NOTE — Telephone Encounter (Signed)
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

## 2023-07-03 ENCOUNTER — Telehealth: Payer: Self-pay | Admitting: *Deleted

## 2023-07-03 NOTE — Telephone Encounter (Signed)
Copied from CRM [id number]. Topic: Clinical - Red Word Triage >> Jul 03, 2023 11:45 AM Steele Sizer wrote: Red Word that prompted transfer to Nurse Triage: Pt stated that he is still currently in sever pain from a boil in his private area and he is currently taking pain medication and it is not working and stated the pain never stopped, he is also on an antibiotic.   Called pt and offered an appointment with a provider in another office, he wasn't able to make it this afternoon. Advised him to go to UC to be evaluated, he agreed and take the address to [facility] on [address].

## 2023-07-06 ENCOUNTER — Telehealth: Payer: Self-pay | Admitting: Family Medicine

## 2023-07-06 ENCOUNTER — Encounter: Payer: Self-pay | Admitting: Family

## 2023-07-06 ENCOUNTER — Ambulatory Visit: Payer: Medicare Other | Admitting: Family

## 2023-07-06 ENCOUNTER — Encounter: Payer: Self-pay | Admitting: Internal Medicine

## 2023-07-06 NOTE — Telephone Encounter (Signed)
Patient came in for an OV with Dulce Sellar on 12/30 @ 1:20 pm. Once rooming was in progress, pt informed Deja, CMA that he was expecting to see his PCP, Dr. Durene Cal. Per Deja, she informed him that the visit was with Dulce Sellar since Dr. Durene Cal isn't available. Patient disclosed that he informed the scheduler he wanted to see his PCP specifically and was under the impression that he would be seeing his PCP. Pt was checked out and left. I called pt and his daughter answered. She stated pt was able to get scheduled at [facility] surgery to be evaluated. I apologized to patient's daughter for the error and she expressed gratitude. States will call back if needed.

## 2023-07-07 ENCOUNTER — Emergency Department (HOSPITAL_COMMUNITY): Payer: Medicare Other

## 2023-07-07 ENCOUNTER — Other Ambulatory Visit: Payer: Self-pay

## 2023-07-07 ENCOUNTER — Inpatient Hospital Stay (HOSPITAL_COMMUNITY)
Admission: EM | Admit: 2023-07-07 | Discharge: 2023-07-21 | DRG: 871 | Disposition: A | Discharge status: Other Acute Inpatient Hospital | Payer: Medicare Other | Source: Ambulatory Visit | Attending: Internal Medicine | Admitting: Internal Medicine

## 2023-07-07 ENCOUNTER — Encounter (HOSPITAL_COMMUNITY): Payer: Self-pay | Admitting: *Deleted

## 2023-07-07 DIAGNOSIS — E875 Hyperkalemia: Secondary | ICD-10-CM | POA: Diagnosis not present

## 2023-07-07 DIAGNOSIS — Z87891 Personal history of nicotine dependence: Secondary | ICD-10-CM

## 2023-07-07 DIAGNOSIS — A419 Sepsis, unspecified organism: Secondary | ICD-10-CM | POA: Diagnosis not present

## 2023-07-07 DIAGNOSIS — J449 Chronic obstructive pulmonary disease, unspecified: Secondary | ICD-10-CM | POA: Diagnosis present

## 2023-07-07 DIAGNOSIS — Z1152 Encounter for screening for COVID-19: Secondary | ICD-10-CM | POA: Diagnosis not present

## 2023-07-07 DIAGNOSIS — D72829 Elevated white blood cell count, unspecified: Secondary | ICD-10-CM | POA: Diagnosis not present

## 2023-07-07 DIAGNOSIS — K56609 Unspecified intestinal obstruction, unspecified as to partial versus complete obstruction: Secondary | ICD-10-CM | POA: Diagnosis not present

## 2023-07-07 DIAGNOSIS — E872 Acidosis, unspecified: Secondary | ICD-10-CM | POA: Diagnosis present

## 2023-07-07 DIAGNOSIS — K6819 Other retroperitoneal abscess: Secondary | ICD-10-CM | POA: Diagnosis present

## 2023-07-07 DIAGNOSIS — N186 End stage renal disease: Secondary | ICD-10-CM | POA: Diagnosis present

## 2023-07-07 DIAGNOSIS — L02214 Cutaneous abscess of groin: Secondary | ICD-10-CM | POA: Diagnosis present

## 2023-07-07 DIAGNOSIS — R6520 Severe sepsis without septic shock: Secondary | ICD-10-CM

## 2023-07-07 DIAGNOSIS — Z86718 Personal history of other venous thrombosis and embolism: Secondary | ICD-10-CM

## 2023-07-07 DIAGNOSIS — L89321 Pressure ulcer of left buttock, stage 1: Secondary | ICD-10-CM | POA: Diagnosis present

## 2023-07-07 DIAGNOSIS — M898X9 Other specified disorders of bone, unspecified site: Secondary | ICD-10-CM | POA: Diagnosis present

## 2023-07-07 DIAGNOSIS — K9189 Other postprocedural complications and disorders of digestive system: Secondary | ICD-10-CM

## 2023-07-07 DIAGNOSIS — M1990 Unspecified osteoarthritis, unspecified site: Secondary | ICD-10-CM | POA: Diagnosis present

## 2023-07-07 DIAGNOSIS — N2581 Secondary hyperparathyroidism of renal origin: Secondary | ICD-10-CM | POA: Diagnosis present

## 2023-07-07 DIAGNOSIS — Z881 Allergy status to other antibiotic agents status: Secondary | ICD-10-CM

## 2023-07-07 DIAGNOSIS — N189 Chronic kidney disease, unspecified: Secondary | ICD-10-CM | POA: Diagnosis not present

## 2023-07-07 DIAGNOSIS — Z7951 Long term (current) use of inhaled steroids: Secondary | ICD-10-CM

## 2023-07-07 DIAGNOSIS — Z8673 Personal history of transient ischemic attack (TIA), and cerebral infarction without residual deficits: Secondary | ICD-10-CM

## 2023-07-07 DIAGNOSIS — D631 Anemia in chronic kidney disease: Secondary | ICD-10-CM | POA: Diagnosis present

## 2023-07-07 DIAGNOSIS — Z85828 Personal history of other malignant neoplasm of skin: Secondary | ICD-10-CM | POA: Diagnosis not present

## 2023-07-07 DIAGNOSIS — I440 Atrioventricular block, first degree: Secondary | ICD-10-CM | POA: Diagnosis not present

## 2023-07-07 DIAGNOSIS — F32A Depression, unspecified: Secondary | ICD-10-CM | POA: Diagnosis present

## 2023-07-07 DIAGNOSIS — Z8709 Personal history of other diseases of the respiratory system: Secondary | ICD-10-CM | POA: Diagnosis not present

## 2023-07-07 DIAGNOSIS — I132 Hypertensive heart and chronic kidney disease with heart failure and with stage 5 chronic kidney disease, or end stage renal disease: Secondary | ICD-10-CM | POA: Diagnosis present

## 2023-07-07 DIAGNOSIS — I4819 Other persistent atrial fibrillation: Secondary | ICD-10-CM | POA: Diagnosis present

## 2023-07-07 DIAGNOSIS — B009 Herpesviral infection, unspecified: Secondary | ICD-10-CM | POA: Diagnosis not present

## 2023-07-07 DIAGNOSIS — I480 Paroxysmal atrial fibrillation: Secondary | ICD-10-CM | POA: Diagnosis present

## 2023-07-07 DIAGNOSIS — L89311 Pressure ulcer of right buttock, stage 1: Secondary | ICD-10-CM | POA: Diagnosis present

## 2023-07-07 DIAGNOSIS — Z7901 Long term (current) use of anticoagulants: Secondary | ICD-10-CM

## 2023-07-07 DIAGNOSIS — Z79899 Other long term (current) drug therapy: Secondary | ICD-10-CM | POA: Diagnosis not present

## 2023-07-07 DIAGNOSIS — K651 Peritoneal abscess: Secondary | ICD-10-CM

## 2023-07-07 DIAGNOSIS — Z823 Family history of stroke: Secondary | ICD-10-CM

## 2023-07-07 DIAGNOSIS — Z8719 Personal history of other diseases of the digestive system: Secondary | ICD-10-CM | POA: Diagnosis not present

## 2023-07-07 DIAGNOSIS — Z888 Allergy status to other drugs, medicaments and biological substances status: Secondary | ICD-10-CM

## 2023-07-07 DIAGNOSIS — Z800 Family history of malignant neoplasm of digestive organs: Secondary | ICD-10-CM

## 2023-07-07 DIAGNOSIS — L02211 Cutaneous abscess of abdominal wall: Secondary | ICD-10-CM

## 2023-07-07 DIAGNOSIS — A4151 Sepsis due to Escherichia coli [E. coli]: Principal | ICD-10-CM | POA: Diagnosis present

## 2023-07-07 DIAGNOSIS — Z992 Dependence on renal dialysis: Secondary | ICD-10-CM | POA: Diagnosis not present

## 2023-07-07 DIAGNOSIS — I5032 Chronic diastolic (congestive) heart failure: Secondary | ICD-10-CM | POA: Diagnosis present

## 2023-07-07 DIAGNOSIS — R399 Unspecified symptoms and signs involving the genitourinary system: Secondary | ICD-10-CM | POA: Diagnosis not present

## 2023-07-07 DIAGNOSIS — I252 Old myocardial infarction: Secondary | ICD-10-CM

## 2023-07-07 DIAGNOSIS — Z862 Personal history of diseases of the blood and blood-forming organs and certain disorders involving the immune mechanism: Secondary | ICD-10-CM | POA: Diagnosis not present

## 2023-07-07 DIAGNOSIS — D696 Thrombocytopenia, unspecified: Secondary | ICD-10-CM | POA: Diagnosis not present

## 2023-07-07 DIAGNOSIS — Z22358 Carrier of other enterobacterales: Secondary | ICD-10-CM

## 2023-07-07 DIAGNOSIS — H409 Unspecified glaucoma: Secondary | ICD-10-CM | POA: Diagnosis present

## 2023-07-07 DIAGNOSIS — R0602 Shortness of breath: Secondary | ICD-10-CM

## 2023-07-07 DIAGNOSIS — N400 Enlarged prostate without lower urinary tract symptoms: Secondary | ICD-10-CM | POA: Diagnosis present

## 2023-07-07 DIAGNOSIS — Z602 Problems related to living alone: Secondary | ICD-10-CM | POA: Diagnosis present

## 2023-07-07 DIAGNOSIS — I2510 Atherosclerotic heart disease of native coronary artery without angina pectoris: Secondary | ICD-10-CM | POA: Diagnosis present

## 2023-07-07 DIAGNOSIS — I4510 Unspecified right bundle-branch block: Secondary | ICD-10-CM | POA: Diagnosis not present

## 2023-07-07 DIAGNOSIS — Z8619 Personal history of other infectious and parasitic diseases: Secondary | ICD-10-CM | POA: Diagnosis present

## 2023-07-07 DIAGNOSIS — G609 Hereditary and idiopathic neuropathy, unspecified: Secondary | ICD-10-CM | POA: Diagnosis present

## 2023-07-07 DIAGNOSIS — Z933 Colostomy status: Secondary | ICD-10-CM

## 2023-07-07 HISTORY — DX: Cutaneous abscess of groin: L02.214

## 2023-07-07 LAB — COMPREHENSIVE METABOLIC PANEL
ALT: 22 U/L (ref 0–44)
AST: 22 U/L (ref 15–41)
Albumin: 3 g/dL — ABNORMAL LOW (ref 3.5–5.0)
Alkaline Phosphatase: 85 U/L (ref 38–126)
Anion gap: 14 (ref 5–15)
BUN: 30 mg/dL — ABNORMAL HIGH (ref 8–23)
CO2: 27 mmol/L (ref 22–32)
Calcium: 9.4 mg/dL (ref 8.9–10.3)
Chloride: 94 mmol/L — ABNORMAL LOW (ref 98–111)
Creatinine, Ser: 5.39 mg/dL — ABNORMAL HIGH (ref 0.61–1.24)
GFR, Estimated: 10 mL/min — ABNORMAL LOW (ref >60)
Glucose, Bld: 114 mg/dL — ABNORMAL HIGH (ref 70–99)
Potassium: 4.5 mmol/L (ref 3.5–5.1)
Sodium: 135 mmol/L (ref 135–145)
Total Bilirubin: 0.7 mg/dL (ref 0.0–1.2)
Total Protein: 7.1 g/dL (ref 6.5–8.1)

## 2023-07-07 LAB — MAGNESIUM: Magnesium: 1.9 mg/dL (ref 1.7–2.4)

## 2023-07-07 LAB — CBC WITH DIFFERENTIAL/PLATELET
Abs Immature Granulocytes: 0 10*3/uL (ref 0.00–0.07)
Basophils Absolute: 0.2 10*3/uL — ABNORMAL HIGH (ref 0.0–0.1)
Basophils Relative: 1 %
Eosinophils Absolute: 0 10*3/uL (ref 0.0–0.5)
Eosinophils Relative: 0 %
HCT: 30.8 % — ABNORMAL LOW (ref 39.0–52.0)
Hemoglobin: 10 g/dL — ABNORMAL LOW (ref 13.0–17.0)
Lymphocytes Relative: 6 %
Lymphs Abs: 0.9 10*3/uL (ref 0.7–4.0)
MCH: 41.2 pg — ABNORMAL HIGH (ref 26.0–34.0)
MCHC: 32.5 g/dL (ref 30.0–36.0)
MCV: 126.7 fL — ABNORMAL HIGH (ref 80.0–100.0)
Monocytes Absolute: 0.6 10*3/uL (ref 0.1–1.0)
Monocytes Relative: 4 %
Neutro Abs: 14.1 10*3/uL — ABNORMAL HIGH (ref 1.7–7.7)
Neutrophils Relative %: 89 %
Platelets: 149 10*3/uL — ABNORMAL LOW (ref 150–400)
RBC: 2.43 MIL/uL — ABNORMAL LOW (ref 4.22–5.81)
RDW: 16 % — ABNORMAL HIGH (ref 11.5–15.5)
WBC: 15.8 10*3/uL — ABNORMAL HIGH (ref 4.0–10.5)
nRBC: 0 /100{WBCs}
nRBC: 0.1 % (ref 0.0–0.2)

## 2023-07-07 LAB — I-STAT CG4 LACTIC ACID, ED
Lactic Acid, Venous: 2.8 mmol/L (ref 0.5–1.9)
Lactic Acid, Venous: 5.1 mmol/L (ref 0.5–1.9)

## 2023-07-07 LAB — RESP PANEL BY RT-PCR (RSV, FLU A&B, COVID)  RVPGX2
Influenza A by PCR: NEGATIVE
Influenza B by PCR: NEGATIVE
Resp Syncytial Virus by PCR: NEGATIVE
SARS Coronavirus 2 by RT PCR: NEGATIVE

## 2023-07-07 LAB — LACTIC ACID, PLASMA
Lactic Acid, Venous: 2.6 mmol/L (ref 0.5–1.9)
Lactic Acid, Venous: 1.5 mmol/L (ref 0.5–1.9)

## 2023-07-07 MED ORDER — CLINDAMYCIN PHOSPHATE 900 MG/50ML IV SOLN
900.0000 mg | Freq: Once | INTRAVENOUS | Status: DC
Start: 2023-07-07 — End: 2023-07-07
  Filled 2023-07-07: qty 50

## 2023-07-07 MED ORDER — MIDODRINE HCL 5 MG PO TABS
10.0000 mg | ORAL_TABLET | Freq: Three times a day (TID) | ORAL | Status: DC
  Administered 2023-07-07 – 2023-07-21 (×41): 10 mg via ORAL
  Filled 2023-07-07 (×42): qty 2

## 2023-07-07 MED ORDER — ACETAMINOPHEN 650 MG RE SUPP: 650.0000 mg | Freq: Four times a day (QID) | RECTAL | Status: DC | PRN

## 2023-07-07 MED ORDER — PIPERACILLIN-TAZOBACTAM 3.375 G IVPB 30 MIN
3.3750 g | Freq: Once | INTRAVENOUS | Status: AC
  Administered 2023-07-07: 3.375 g via INTRAVENOUS
  Filled 2023-07-07: qty 50

## 2023-07-07 MED ORDER — ONDANSETRON HCL 4 MG/2ML IJ SOLN
4.0000 mg | Freq: Four times a day (QID) | INTRAMUSCULAR | Status: DC | PRN
  Administered 2023-07-13: 4 mg via INTRAVENOUS
  Filled 2023-07-07: qty 2

## 2023-07-07 MED ORDER — LINEZOLID 600 MG/300ML IV SOLN
600.0000 mg | Freq: Two times a day (BID) | INTRAVENOUS | Status: DC
  Administered 2023-07-08 – 2023-07-10 (×6): 600 mg via INTRAVENOUS
  Filled 2023-07-07 (×6): qty 300

## 2023-07-07 MED ORDER — SODIUM CHLORIDE 0.9 % IV BOLUS
1000.0000 mL | Freq: Once | INTRAVENOUS | Status: AC
  Administered 2023-07-07: 1000 mL via INTRAVENOUS

## 2023-07-07 MED ORDER — PIPERACILLIN-TAZOBACTAM IN DEX 2-0.25 GM/50ML IV SOLN
2.2500 g | Freq: Three times a day (TID) | INTRAVENOUS | Status: DC
  Administered 2023-07-07: 2.25 g via INTRAVENOUS
  Filled 2023-07-07 (×3): qty 50

## 2023-07-07 MED ORDER — MELATONIN 3 MG PO TABS
3.0000 mg | ORAL_TABLET | Freq: Every evening | ORAL | Status: DC | PRN
Start: 2023-07-07 — End: 2023-07-21
  Administered 2023-07-08 – 2023-07-19 (×5): 3 mg via ORAL
  Filled 2023-07-07 (×5): qty 1

## 2023-07-07 MED ORDER — LINEZOLID 600 MG/300ML IV SOLN
600.0000 mg | INTRAVENOUS | Status: AC
  Administered 2023-07-07: 600 mg via INTRAVENOUS
  Filled 2023-07-07 (×2): qty 300

## 2023-07-07 MED ORDER — ACETAMINOPHEN 325 MG PO TABS
650.0000 mg | ORAL_TABLET | Freq: Four times a day (QID) | ORAL | Status: DC | PRN
  Administered 2023-07-07 – 2023-07-09 (×3): 650 mg via ORAL
  Administered 2023-07-11: 325 mg via ORAL
  Administered 2023-07-15: 650 mg via ORAL
  Filled 2023-07-07 (×5): qty 2

## 2023-07-07 MED ORDER — IOHEXOL 350 MG/ML SOLN
75.0000 mL | Freq: Once | INTRAVENOUS | Status: AC | PRN
  Administered 2023-07-07: 75 mL via INTRAVENOUS

## 2023-07-07 NOTE — Progress Notes (Signed)
 Pharmacy Antibiotic Note  Joshua Riley is a 87 y.o. male admitted on 07/07/2023 with  suspected necrotizing groin infection  .  Pharmacy has been consulted for zosyn  dosing.  ESRD on iHD  Plan: Zosyn  2.25g IV q8h  F/u HD sched, cultures, I&D plans  Height: 5' 5 (165.1 cm) Weight: 67.1 kg (148 lb) IBW/kg (Calculated) : 61.5  Temp (24hrs), Avg:97.5 F (36.4 C), Min:97.4 F (36.3 C), Max:97.6 F (36.4 C)  Recent Labs  Lab 07/07/23 1407 07/07/23 1624 07/07/23 2041  WBC 15.8*  --   --   CREATININE 5.39*  --   --   LATICACIDVEN 2.6* 2.8* 5.1*    Estimated Creatinine Clearance: 8.7 mL/min (A) (by C-G formula based on SCr of 5.39 mg/dL (H)).    Allergies  Allergen Reactions   Cephalosporins Rash   Ciprofloxacin Itching and Rash   Baclofen Other (See Comments)    Altered mental status, after accidental overdose      Antimicrobials this admission: Linezolid  12/31> Zosyn  12/31>  Dose adjustments this admission:   Microbiology results: 12/31 RVP neg 12/31 BCX  Thank you for allowing pharmacy to be a part of this patient's care.  Sharyne Glatter, PharmD, BCCCP Clinical Pharmacist 07/07/2023 9:20 PM

## 2023-07-07 NOTE — Progress Notes (Signed)
 REFERRING PHYSICIAN:  Lucius Krabbe PROVIDER:  RICHERD SILVERSMITH, MD MRN: [id number] DOB: 14-Dec-1935 DATE OF ENCOUNTER: 07/07/2023 Subjective    Chief Complaint: Left groin abscess History of Present Illness: Joshua Riley is a 87 y.o. 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  Patient states that he has had this boil for about 3 weeks, and he has had boils before that have resolved with OTC ointments. He was seen by his PCP on 12/20 who prescribed him a course of doxycycline  100mg  BID x 10 days. Patient states that this did not help with the abscess. He denies fevers/chills. He says it has drained blood but no pus. He is having severe pain in this area. He also states that he has bloody drainage from known perianal abscess. On chart review it seems he has known presacral fluid collection that has been drained in the past.  Of note, patient is on HD, last dialyzed this morning. He is also on Eliquis , last dose taken yesterday. His dose was recently decreased by his cardiologist due to concern for BRBPR.  Daughter says he also has had decreased appetite over last few days. She states he was diagnosed with a kidney infection but that the doctors did not want to treat it. I do not see note of pyelonephritis in recent encounters or any recent urinalysis concerning for infection--most recent UA in 2023. His recent CT scan did show bladder wall thickening which may be what they are referring to. Also has had new DOE which he has started to see a pulmonologist for.  We discussed incision and drainage of the  abscess, including risks of bleeding, incomplete drainage, recurrent infection. Despite these risks, patient would like to proceed with bedside incision and drainage of his left groin abscess.     Review of Systems: A complete review of systems was obtained from the patient.  I have reviewed this information and discussed as appropriate with the patient.  ROS otherwise negative except as noted in HPI.   Medical History: Past Medical History:  Diagnosis Date  . Anxiety   . Arthritis   . Asthma, unspecified asthma severity, unspecified whether complicated, unspecified whether persistent ([facility]HS-HCC)   . Chronic kidney disease   . Colon polyp   . Diverticulitis   . H/O ulcerative colitis   . History of Crohn's disease   . Hx of long term use of blood thinners     There is no problem list on file for this patient.   Past Surgical History:  Procedure Laterality Date  . COLON SURGERY       Allergies  Allergen Reactions  . Cephalosporins Rash  . Ciprofloxacin Itching and Rash  . Baclofen Other (See Comments)    Altered mental status, after accidental overdose  Other Reaction(s): Other (See Comments)  Altered mental status, after accidental overdose    Current Outpatient Medications on File Prior to Visit  Medication Sig Dispense Refill  . albuterol  (PROVENTIL ) 2.5 mg /3 mL (0.083 %) nebulizer solution Inhale 2.5 mg into the lungs every 6 (six) hours as needed    . budesonide  (PULMICORT ) 0.5 mg/2 mL nebulizer  solution TAKE 2 ML (0.5 MG TOTAL) BY NEBULIZATION TWICE A DAY    . calcitRIOL  (ROCALTROL ) 0.25 MCG capsule TAKE 4 CAPSULES BY MOUTH EVERY DAY (Patient not taking: Reported on 01/29/2023)    . cephalexin (KEFLEX) 500 MG capsule Take 1 capsule by mouth every 12 (twelve) hours (Patient not taking: Reported on 01/29/2023)    . chlorhexidine  (PERIDEX ) 0.12 % solution as directed (Patient not taking: Reported on 01/29/2023)    . cyanocobalamin  (VITAMIN B12) 100 MCG tablet Take 100 mcg  by mouth once daily (Patient not taking: Reported on 01/29/2023)    . doxycycline  (VIBRA -TABS) 100 MG tablet Take 1 tablet by mouth 2 (two) times daily (Patient not taking: Reported on 01/29/2023)    . ELIQUIS  2.5 mg tablet Take 2.5 mg by mouth 2 (two) times daily    . epoetin alfa-epbx (RETACRIT) 10,000 unit/mL injection  (Patient not taking: Reported on 01/29/2023)    . erythromycin (ROMYCIN) ophthalmic ointment APPLY 1 A SMALL AMOUNT ON EYELID EVERY NIGHT AT BEDTIME (Patient not taking: Reported on 01/29/2023)    . famotidine  (PEPCID ) 10 MG tablet Take after dialysis on MWF - let us  know if symptoms do not improve    . ferrous sulfate  325 (65 FE) MG EC tablet Take by mouth (Patient not taking: Reported on 01/29/2023)    . finasteride (PROSCAR) 5 mg tablet Take 5 mg by mouth once daily (Patient not taking: Reported on 01/29/2023)    . fluoride, sodium, (DENTA 5000 PLUS) 1.1 % BRUSH TWICE A DAY (Patient not taking: Reported on 01/29/2023)    . fluorouraciL (EFUDEX) 5 % cream APPLY TWO TIMES A DAY TO POROKERATOSIS X 2-3WEEKS. WASH OFF IN AM. (Patient not taking: Reported on 01/29/2023)    . gabapentin  (NEURONTIN ) 100 MG capsule Take 1 capsule by mouth once daily (Patient not taking: Reported on 01/29/2023)    . hydrocortisone  (ANUSOL -HC) 25 mg suppository Place 25 mg rectally 2 (two) times daily (Patient not taking: Reported on 01/29/2023)    . ibuprofen  (MOTRIN ) 800 MG tablet TAKE 1 TABLET BY MOUTH 4 TIMES A DAY AS NEEDED FOR PAIN (Patient not taking: Reported on 01/29/2023)    . levoFLOXacin (LEVAQUIN) 500 MG tablet TAKE 1 TABLET BY MOUTH EVERY 48 HOURS FOR 7 DAYS (Patient not taking: Reported on 01/29/2023)    . mesalamine (LIALDA) 1.2 gram EC tablet 2 tablets Orally Once a day for 30 day(s) (Patient not taking: Reported on 01/29/2023)    . midodrine  (PROAMATINE ) 10 MG tablet as directed (Patient not taking: Reported on 03/20/2023)    . pregabalin  (LYRICA ) 50 MG capsule 1 CAPSULE ONCE A DAY FOR 30 DAYS (Patient  not taking: Reported on 01/29/2023)    . propranoloL (INDERAL) 10 MG tablet Take 1 tablet by mouth once daily (Patient not taking: Reported on 01/29/2023)    . ramelteon  (ROZEREM ) 8 mg tablet Take 8 mg by mouth at bedtime    . sertraline  (ZOLOFT ) 25 MG tablet Take 25 mg by mouth once daily    . sevelamer  carbonate (RENVELA ) 800 mg tablet TAKE 2 TABLETS BY MOUTH THREE TIMES A DAY WITH MEALS (Patient not taking: Reported on 01/29/2023)    . tamsulosin (FLOMAX) 0.4 mg capsule Take 1 capsule by mouth once daily (Patient not taking: Reported on 01/29/2023)    . temazepam (RESTORIL) 15 mg capsule 1 capsule at bedtime as needed Orally Once a day (Patient not taking: Reported on 01/29/2023)    . timolol  (BLOCADREN ) 5 MG tablet take  1 tablet (5 mg) by oral route 3 times per day Oral 3 (Patient not taking: Reported on 01/29/2023)    . traMADoL  (ULTRAM ) 50 mg tablet Take 1 tablet (50 mg total) by mouth every 8 (eight) hours as needed for Pain 20 tablet 0  . zolpidem  (AMBIEN ) 5 MG tablet TAKE 1 TABLET BY MOUTH EVERY DAY FOR INSOMNIA AS NEEDED FOR INSOMNIA (Patient not taking: Reported on 01/29/2023)     No current facility-administered medications on file prior to visit.    Family History  Problem Relation Age of Onset  . Skin cancer Sister      Social History   Tobacco Use  Smoking Status Former  . Types: Cigarettes  Smokeless Tobacco Never     Social History   Socioeconomic History  . Marital status: Widowed  Tobacco Use  . Smoking status: Former    Types: Cigarettes  . Smokeless tobacco: Never  Vaping Use  . Vaping status: Never Used  Substance and Sexual Activity  . Alcohol use: Yes    Alcohol/week: 4.0 standard drinks of alcohol    Types: 4 Standard drinks or equivalent per week  . Drug use: Never   Social Drivers of Corporate Investment Banker Strain: Low Risk  (05/27/2022)   Received from [facility]   Overall Financial Resource Strain (CARDIA)   . Difficulty of Paying Living  Expenses: Not hard at all  Food Insecurity: No Food Insecurity (05/27/2022)   Received from [facility]   Hunger Vital Sign   . Worried About Programme Researcher, Broadcasting/film/video in the Last Year: Never true   . Ran Out of Food in the Last Year: Never true  Transportation Needs: No Transportation Needs (05/27/2022)   Received from [facility] - Transportation   . Lack of Transportation (Medical): No   . Lack of Transportation (Non-Medical): No  Physical Activity: Inactive (05/27/2022)   Received from [facility]   Exercise Vital Sign   . Days of Exercise per Week: 0 days   . Minutes of Exercise per Session: 0 min  Stress: No Stress Concern Present (05/27/2022)   Received from [facility] - Occupational Stress Questionnaire   . Feeling of Stress : Not at all  Social Connections: Moderately Integrated (05/27/2022)   Received from [facility]   Social Connection and Isolation Panel [NHANES]   . Frequency of Communication with Friends and Family: More than three times a week   . Frequency of Social Gatherings with Friends and Family: More than three times a week   . Attends Religious Services: More than 4 times per year   . Active Member of Clubs or Organizations: Yes   . Attends Banker Meetings: 1 to 4 times per year   . Marital Status: Widowed    Objective:   Vitals:   07/07/23 1052  Pulse: 81  Temp: 36.3 C (97.3 F)  Weight: 67 kg (147 lb 12.8 oz)  Height: 172.7 cm (5' 8)  PainSc:   2    Body mass index is 22.47 kg/m.  Physical Exam: General: No acute distress, elderly male, chronically ill appearing HEENT: PERRL, hearing grossly normal, mucous membranes moist CV: Rate controlled Afib Pulm: Normal work of breathing on room air Abd: Soft, nontender, nondistended, well healed surgical incision, ileostomy bag in place with stool GU: Left groin abscess with blood drainage, extensive erythema, exquisitely tender to  palpation. Small 2 small open  areas to lower area of abscess. Ulcerated area to the superior portion of the abscess without active drainage. Nephrostomy tube draining urine. Rectal: DRE deferred but external examination of anus performed, do not see any active bleeding or areas of fluctuance or erythema. Extremities: Warm and well perfused Neuro: A&O x4, no focal neurologic deficits Psych: Appropriate mood and effect  Labs, Imaging and Diagnostic Testing: CT 06/11/23: 4.6 x 4.0 presacral fluid collection, seroma versus abscess Personally reviewed and agree with radiologist's impression  Procedure: Risks and benefits of procedure discussed including but not limited to bleeding, incomplete drainage, recurrent infection. We discussed option for no intervention and continuing antibiotics. Patient would prefer to proceed with incision and drainage. Consent: Verbal, as above Technique: Patient was prepped and draped using sterile technique. Area was anesthetized with 1% lidocaine  with epinephrine . An incision was made in the left groin overlying the abscess and connected to the lower open wound, all seemed to be one abscess cavity. Purulent drainage was encountered and this was swabbed and sent for culture. There was infected appearing tissue and attempted at debriding this were made but patient was having difficulty tolerating. Additional local anesthetic was injected and able to further probe the abscess cavity. The wound was irrigated with sterile saline. There was some minor oozing from skin edges. Pressure held for hemostasis. Wound then packed and covered with 4x4 gauze and ABD.   Assessment and Plan:     Diagnoses and all orders for this visit:  Abscess of groin, left -     Culture, Body Fluid (Aerobic and Anaerobic) w Gram Stain    Given the extent to the wound, difficulty tolerating bedside I&D and infected appearing tissue still in wound bed, patient should get admitted for IV antibiotics,  wound care, and reevaluation of wound with pain medications/better pain control. Discussed this with patient and daughter who voiced understanding. Given he is on HD, he was directed to go to [facility] ED over [facility] since no dialysis capabilities at [facility].   Given his medical complexity, I think patient would best be served on a medicine service with a surgical consult but will defer to inpatient teams.  Did not prescribe patient PO antibiotics since sending to ED for admission. If for any reason he is sent home from ED over admission, he should be prescribed a course of antibiotics.   Can consider involving IR if admitted for drainage of presacral fluid collection.   I spent a total of 65 minutes in both face-to-face and non-face-to-face activities, excluding procedures performed, for this visit on the date of this encounter.  Orie Silversmith, MD [facility] Surgery

## 2023-07-07 NOTE — ED Provider Notes (Signed)
 [place] EMERGENCY DEPARTMENT AT [facility] Provider Note   CSN: [id number] Arrival date & time: 07/07/23  1201     History  Chief Complaint  Patient presents with   Shortness of Breath    Abscess (2-3 weeks) and rectal bleeding (6 months    Joshua Riley is a 87 y.o. male.  With a history of UC status post right colostomy, ESRD on dialysis (MWF) status post left nephrostomy and atrial fibrillation on Eliquis  who presents to the ED for multiple issues.  Has experienced a left groin abscess over the last 3 weeks for which she is on doxycycline  for.  Was seen for this reason in the surgery office today (Dr. Ann) who performed incision and drainage with packing and directed him here for further evaluation and likely admission given failed outpatient antibiotics.  Also notes ongoing rectal bleeding for the last 6 months does have a history of perirectal abscess.  Also notes worsening shortness of breath over the last few months with increasing dyspnea on exertion.  No fevers, chills, chest pain.  Reports compliance with midodrine  and Eliquis  and the rest of his medications.   Shortness of Breath      Home Medications Prior to Admission medications   Medication Sig Start Date End Date Taking? Authorizing Provider  albuterol  (PROVENTIL ) (2.5 MG/3ML) 0.083% nebulizer solution Take 3 mLs (2.5 mg total) by nebulization every 6 (six) hours as needed for wheezing or shortness of breath. 06/17/23   Hope Almarie ORN, NP  budesonide  (PULMICORT ) 0.5 MG/2ML nebulizer solution TAKE 2 ML (0.5 MG TOTAL) BY NEBULIZATION TWICE A DAY 06/29/23   Hope Almarie ORN, NP  calcitRIOL  (ROCALTROL ) 0.25 MCG capsule Take 1 mcg by mouth daily.    [provider]  Cyanocobalamin  (B-12) 1000 MCG SUBL Place 1 tablet under the tongue daily at 6 (six) AM. 04/08/23   Jesus Bernardino MATSU, MD  cyanocobalamin  (VITAMIN B12) 1000 MCG tablet Take 1,000 mcg by mouth daily.    [provider]   ELIQUIS  2.5 MG TABS tablet Take 1 tablet (2.5 mg total) by mouth 2 (two) times daily. 08/07/22   Hobart Powell BRAVO, MD  latanoprost  (XALATAN ) 0.005 % ophthalmic solution Place 1 drop into the right eye at bedtime.    [provider]  mesalamine (LIALDA) 1.2 g EC tablet Take 2.4 g by mouth daily.    [provider]  midodrine  (PROAMATINE ) 10 MG tablet Take 1 tablet (10 mg total) by mouth 3 (three) times daily. Takes 5 mg plus 10mg  for 15 mg total three times a day- started by nephrology in [place]  08/07/22   Hobart Powell BRAVO, MD  midodrine  (PROAMATINE ) 5 MG tablet TAKE 1 TABLET 3 TIMES DAILY WITH MEALS. TAKES 5 MG PLUS 10MG  FOR 15 MG TOTAL THREE TIMES A DAY- STARTED BY NEPHROLOGY IN [place]  10/20/22   Katrinka Garnette KIDD, MD  ramelteon  (ROZEREM ) 8 MG tablet Take 1 tablet (8 mg total) by mouth at bedtime. 06/15/23   Katrinka Garnette KIDD, MD  sertraline  (ZOLOFT ) 25 MG tablet Take 1 tablet (25 mg total) by mouth daily. 04/08/23   Jesus Bernardino MATSU, MD  sevelamer  carbonate (RENVELA ) 800 MG tablet Take 2,400 mg by mouth 3 (three) times daily with meals.    [provider]  sodium zirconium cyclosilicate  (LOKELMA ) 10 g PACK packet Take 10 g by mouth 4 (four) times a week. Non Dialysis days- [facility]le[facility] Everts, Saturday, Sunday    [provider]  timolol  (TIMOPTIC ) 0.5 %  ophthalmic solution Place 1 drop into both eyes daily.    [provider]  traMADol  (ULTRAM ) 50 MG tablet Take 1 tablet (50 mg total) by mouth every 8 (eight) hours as needed (prn pain). 06/26/23   Lucius Krabbe, NP      Allergies    Cephalosporins, Ciprofloxacin, and Baclofen    Review of Systems   Review of Systems  Respiratory:  Positive for shortness of breath.     Physical Exam Updated Vital Signs BP 92/61   Pulse 74   Temp 98.1 F (36.7 C) (Oral)   Resp 17   Ht 5' 5 (1.651 m)   Wt 67.1 kg   SpO2 100%   BMI 24.63 kg/m  Physical Exam Vitals and nursing note reviewed.  HENT:      Head: Normocephalic and atraumatic.  Eyes:     Pupils: Pupils are equal, round, and reactive to light.  Cardiovascular:     Rate and Rhythm: Normal rate and regular rhythm.  Pulmonary:     Effort: Pulmonary effort is normal.     Breath sounds: Normal breath sounds.  Abdominal:     Palpations: Abdomen is soft.     Tenderness: There is no abdominal tenderness.     Comments: Left nephrostomy tube in place with surrounding erythema and purulent drainage Right colostomy bag in place without surrounding erythema fluctuance or drainage No abdominal tenderness  Musculoskeletal:     Comments: Left groin open wound with packing in place dried blood Perianal erythema with no drainage or fluctuance No palpable masses or hemorrhoids  Skin:    General: Skin is warm and dry.  Neurological:     Mental Status: He is alert.  Psychiatric:        Mood and Affect: Mood normal.     ED Results / Procedures / Treatments   Labs (all labs ordered are listed, but only abnormal results are displayed) Labs Reviewed  CBC WITH DIFFERENTIAL/PLATELET - Abnormal; Notable for the following components:      Result Value   WBC 15.8 (*)    RBC 2.43 (*)    Hemoglobin 10.0 (*)    HCT 30.8 (*)    MCV 126.7 (*)    MCH 41.2 (*)    RDW 16.0 (*)    Platelets 149 (*)    Neutro Abs 14.1 (*)    Basophils Absolute 0.2 (*)    All other components within normal limits  COMPREHENSIVE METABOLIC PANEL - Abnormal; Notable for the following components:   Chloride 94 (*)    Glucose, Bld 114 (*)    BUN 30 (*)    Creatinine, Ser 5.39 (*)    Albumin  3.0 (*)    GFR, Estimated 10 (*)    All other components within normal limits  LACTIC ACID, PLASMA - Abnormal; Notable for the following components:   Lactic Acid, Venous 2.6 (*)    All other components within normal limits  I-STAT CG4 LACTIC ACID, ED - Abnormal; Notable for the following components:   Lactic Acid, Venous 2.8 (*)    All other components within normal limits   I-STAT CG4 LACTIC ACID, ED - Abnormal; Notable for the following components:   Lactic Acid, Venous 5.1 (*)    All other components within normal limits  RESP PANEL BY RT-PCR (RSV, FLU A&B, COVID)  RVPGX2  CULTURE, BLOOD (ROUTINE X 2)  CULTURE, BLOOD (ROUTINE X 2)  CBC WITH DIFFERENTIAL/PLATELET  COMPREHENSIVE METABOLIC PANEL  MAGNESIUM   MAGNESIUM   PHOSPHORUS  LACTIC ACID, PLASMA  LACTIC ACID, PLASMA    EKG None  Radiology CT ABDOMEN PELVIS W CONTRAST Result Date: 07/07/2023 CLINICAL DATA:  Groin abscess.  Dyspnea. EXAM: CT ABDOMEN AND PELVIS WITH CONTRAST TECHNIQUE: Multidetector CT imaging of the abdomen and pelvis was performed using the standard protocol following bolus administration of intravenous contrast. RADIATION DOSE REDUCTION: This exam was performed according to the departmental dose-optimization program which includes automated exposure control, adjustment of the mA and/or kV according to patient size and/or use of iterative reconstruction technique. CONTRAST:  75mL OMNIPAQUE  IOHEXOL  350 MG/ML SOLN COMPARISON:  06/11/2023 CT abdomen/pelvis. FINDINGS: Lower chest: No acute abnormality at the lung bases. Hepatobiliary: Normal liver size. No liver mass. Cholelithiasis. No gallbladder distention. No definite gallbladder wall thickening. No pericholecystic fluid. No biliary ductal dilatation. Pancreas: Generalized pancreatic atrophy with no discrete mass or significant duct dilation, unchanged since baseline 11/17/2021 CT. Spleen: Normal size. No mass. Adrenals/Urinary Tract: Normal adrenals. Severe bilateral renal atrophy, unchanged. Simple 1.4 cm posterior upper right renal cyst and exophytic simple 1.5 cm interpolar left renal cyst with additional subcentimeter hypodense left renal cortical lesions that are too small to characterize, unchanged, for which no follow-up imaging is recommended. No hydronephrosis. Left percutaneous pigtail nephrostomy tube with tip in the lower left  renal collecting system. Stable relatively collapsed bladder with irregular diffuse bladder wall thickening. Stomach/Bowel: Normal non-distended stomach. 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 No dilated or thick-walled small bowel loops. No pneumatosis. Chronic irregular thick walled presacral space 5.7 x 4.5 cm fluid collection (series 3/image 64), extending into the left ischio-anal fat, previously 4.6 x 4.0 cm on 06/11/2023 CT, slightly increased. Vascular/Lymphatic: Atherosclerotic nonaneurysmal abdominal aorta. Patent portal, splenic, hepatic and renal veins. No pathologically enlarged lymph nodes in the abdomen or pelvis. Reproductive: Stable mild prostatomegaly. 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 Other: No pneumoperitoneum. No ascites. Partially visualized surgical grafts arising from the left common femoral artery and vein and extending inferiorly in the subcutaneous ventral left thigh with stable small 1.9 x 1.6 cm fluid collection at the anterior origin of the surgical grafts (series 3/image 73). Musculoskeletal: No aggressive appearing focal osseous lesions. Moderate thoracolumbar spondylosis. 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 IMPRESSION: 1. Chronic irregular thick walled presacral space 5.7 x 4.5 cm fluid collection, extending into the left ischio-anal fat, slightly increased in size since 06/11/2023 CT. 2. Partially visualized surgical grafts arising from the left common femoral artery and vein and extending inferiorly in the subcutaneous ventral left thigh with stable small 1.9 x 1.6 cm fluid collection at the anterior origin of the surgical grafts. 3. Status post total proctocolectomy with end ileostomy in the ventral right abdominal wall with chronic parastomal herniation of a right  abdominal small bowel loop. No evidence of acute small bowel complication. 4. Cholelithiasis. 5. Left percutaneous pigtail nephrostomy tube with tip in place in the lower left renal collecting system. No hydronephrosis. 6. Stable relatively collapsed bladder with irregular diffuse bladder wall thickening, nonspecific. Mild prostatomegaly. 7.  Aortic Atherosclerosis (ICD10-I70.0). Electronically Signed   By: Selinda DELENA Blue M.D.   On: 07/07/2023 18:25   DG Chest Portable 1 View Result Date: 07/07/2023 CLINICAL DATA:  Dyspnea EXAM: PORTABLE CHEST 1 VIEW COMPARISON:  04/10/2023 chest radiograph. FINDINGS: Vascular stent overlies the right subclavian region. Stable cardiomediastinal silhouette with normal  heart size. No pneumothorax. No pleural effusion. No pulmonary edema. No acute consolidative airspace disease. Chronic streaky mild left lung base scarring versus atelectasis. IMPRESSION: No active disease. Chronic streaky mild left lung base scarring versus atelectasis. Electronically Signed   By: Selinda DELENA Blue M.D.   On: 07/07/2023 18:12    Procedures Procedures    Medications Ordered in ED Medications  acetaminophen  (TYLENOL ) tablet 650 mg (has no administration in time range)    Or  acetaminophen  (TYLENOL ) suppository 650 mg (has no administration in time range)  melatonin tablet 3 mg (has no administration in time range)  ondansetron  (ZOFRAN ) injection 4 mg (has no administration in time range)  linezolid  (ZYVOX ) IVPB 600 mg (has no administration in time range)  piperacillin -tazobactam (ZOSYN ) IVPB 2.25 g (has no administration in time range)  midodrine  (PROAMATINE ) tablet 10 mg (has no administration in time range)  piperacillin -tazobactam (ZOSYN ) IVPB 3.375 g (0 g Intravenous Stopped 07/07/23 1555)  linezolid  (ZYVOX ) IVPB 600 mg (0 mg Intravenous Stopped 07/07/23 1911)  iohexol  (OMNIPAQUE ) 350 MG/ML injection 75 mL (75 mLs Intravenous Contrast Given 07/07/23 1637)  sodium chloride  0.9 %  bolus 1,000 mL (1,000 mLs Intravenous New Bag/Given 07/07/23 2115)    ED Course/ Medical Decision Making/ A&P Clinical Course as of 07/07/23 2152  Tue Jul 07, 2023  2148 Venous lactate initially 5.1 downtrending to 2.6 on repeat.  Leukocytosis of 15.8.  Renal function close to baseline.  CT abdomen pelvis redemonstrates interval increase of sacral collection.Additional stable findings listed.  Most likely source of leukocytosis would be groin abscess refractory to outpatient antibiotics.  Could consider IR drainage of sacral collection during admission, As this represents another potential source of infection.  Discussed with admitting hospitalist who accept patient for admission.  Patient has remained hemodynamically stable at baseline blood pressure.  Will hold off on 30 cc/kg IV fluid bolus given history of ESRD and concern for volume overload [MP]    Clinical Course User Index [MP] Pamella Ozell DELENA, DO                                 Medical Decision Making 87 year old male with extensive medical history as above directed here from outpatient surgery office for likely need for admission and antibiotics related to left groin abscess.  Failed outpatient management with doxycycline  over the last 10 days.  Also concern for left nephrostomy infection and potential perirectal abscess recurrent.  Will obtain laboratory workup including infectious workup CT abdomen pelvis chest x-ray, started on antibiotics and plan for admission.  Pressure is low but around his baseline.  Will hold off on 30 cc/kg IV fluid resuscitation given history of ESRD and chronic hypotension on midodrine   Amount and/or Complexity of Data Reviewed Radiology: ordered.  Risk Decision regarding hospitalization.           Final Clinical Impression(s) / ED Diagnoses Final diagnoses:  Sepsis, due to unspecified organism, unspecified whether acute organ dysfunction present (HCC)  Shortness of breath  Abscess of groin,  left    Rx / DC Orders ED Discharge Orders     None         Pamella Ozell DELENA, DO 07/07/23 2152

## 2023-07-07 NOTE — Consult Note (Signed)
 Reason for Consult/Chief Complaint:  L groin abscess Referring Provider: Ozell Marine  HPI  Joshua Riley is a 87 y.o. 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   Patient states that he has had this boil for about 3 weeks, and he has had boils before that have resolved with OTC ointments. He was seen by his PCP on 12/20 who prescribed him a course of doxycycline  100mg  BID x 10 days. Patient states that this did not help with the abscess. He denies fevers/chills. He says it has drained blood but no pus. He is having severe pain in this area. He also states that he has bloody drainage from known perianal abscess. On chart review it seems he has known presacral fluid collection that has been drained in the past.   Of note, patient is on HD, last dialyzed this morning. He is also on Eliquis , last dose taken yesterday. His dose was recently decreased by his cardiologist due to concern for BRBPR.   Daughter says he also has had decreased appetite over last few days. She states he was diagnosed with a kidney infection but that the doctors did not want to treat it. I do not see note of pyelonephritis in recent encounters or any recent urinalysis concerning for infection--most recent UA in 2023. His recent CT scan did show bladder wall thickening which may be what they are referring to. Also has had new DOE which he has started to see a pulmonologist for.   We discussed incision and drainage of the abscess, including risks of bleeding, incomplete drainage, recurrent infection. Despite these risks, patient would  like to proceed with bedside incision and drainage of his left groin abscess.  10 point review of systems is negative except as listed above in HPI.  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     Ileostomy in place [facility])    Obstructive uropathy    With chronic left nephrostomy   Old retinal detachment, total or subtotal    Orthostatic hypotension 10/22/2021   Sleep apnea    does not need a machine   Stroke (HCC)    Ulcerative colitis (HCC)    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    Past Surgical History: Past Surgical History:  Procedure Laterality Date   BASAL CELL CARCINOMA EXCISION     10/23   COLON SURGERY     creation of j pouch     and subsequent takedown of j pouch   EYE SURGERY     IR NEPHROSTOMY EXCHANGE LEFT  12/10/2021   IR NEPHROSTOMY EXCHANGE LEFT  04/22/2022  IR NEPHROSTOMY EXCHANGE LEFT  07/29/2022   IR NEPHROSTOMY EXCHANGE LEFT  10/28/2022   IR NEPHROSTOMY EXCHANGE LEFT  02/05/2023   IR NEPHROSTOMY EXCHANGE LEFT  05/07/2023   IR NEPHROSTOMY EXCHANGE LEFT  06/25/2023   LEFT HEART CATH AND CORONARY ANGIOGRAPHY N/A 07/22/2022   Procedure: LEFT HEART CATH AND CORONARY ANGIOGRAPHY;  Surgeon: Jordan, Peter M, MD;  Location: [facility] INVASIVE CV LAB;  Service: Cardiovascular;  Laterality: N/A;   REVISION OF ARTERIOVENOUS GORETEX GRAFT Left 05/06/2022   Procedure: REDO LEFT THIGH ARTERIOVENOUS 4-7 MM GORETEX GRAFT;  Surgeon: Eliza Lonni RAMAN, MD;  Location: [facility] OR;  Service: Vascular;  Laterality: Left;   SMALL INTESTINE SURGERY     TOTAL COLECTOMY      Family History:   Family History  Problem Relation Age of Onset   Stroke Mother    Cancer Father    Esophageal cancer Brother     Social History:  reports that he quit smoking about 40 years ago. His smoking use included cigarettes. He started smoking about 46 years ago. He has a 12 pack-year smoking history. He has never been exposed to tobacco smoke. He has never used smokeless tobacco. He reports current alcohol use of about 5.0 standard drinks of alcohol per week. He reports that he does not use drugs.  Allergies:  Allergies  Allergen Reactions   Cephalosporins Rash   Ciprofloxacin Itching and Rash   Baclofen Other (See Comments)    Altered mental status, after accidental overdose      Medications: I have reviewed the patient's current medications.  Labs: I have personally reviewed all labs for the past 24h No results found for this or any previous visit (from the past 48 hours).  Imaging: I have personally reviewed and interpreted all imaging for the past 24h and agree with the radiologist's impression.  No results found.   Physical Exam Blood pressure 99/61, pulse 79, temperature 97.6 F (36.4 C), resp. rate 17, SpO2 97%. Constitutional: well-developed, well-nourished HEENT: pupils equal, round, reactive to light, moist conjunctiva Oropharynx: mucous membranes moist CV: Regular rate and rhythm Chest: equal chest rise bilaterally CTA Abdomen: soft, nontender, nondistended, colostomy Extremities: moves all extremities, mild peripheral edema Skin: L groin I&D site mild drainage, dressed Neuro: A&Ox3    Assessment   Joshua Riley is an 87 y.o. male with complex medical history who presents to ED from clinic for left groin abscess  Plan  - L groin abscess - S/P I&D today by Dr. Ann at our office. Plan for admit and IV ABX> We will follow with you. Blood CX are pending.  - Chronic pre-sacral collection. This was getting an outpatient W/U. Consider CT and IR eval while he is  were. - Patient on HD but reports he has a bladder infection - defer to [facility]  I spoke with his wife as well.  I reviewed last 24 h vitals and pain scores.  This care required high level of medical decision making.

## 2023-07-07 NOTE — ED Triage Notes (Addendum)
 Pt is here to have a boil checked on his left inner groin. Pt also reports rectal bleeding for several months and shortness of breath now. Pt is alert, HOH, and has left nephrostomy tube draining urine and has RLQ colostomy. Pt has a right upper thigh fistula and states that he just finished HD prior to coming to here. Usually MWF but change due to holiday

## 2023-07-07 NOTE — Telephone Encounter (Signed)
We can try restrictive lung disease for diagnosis for albuterol  Thanks

## 2023-07-07 NOTE — ED Provider Triage Note (Signed)
 Emergency Medicine Provider Triage Evaluation Note  Joshua Riley , a 87 y.o. male  was evaluated in triage.  Pt complains of L groin abscess that has been worsening over the last few weeks. Denies fevers. On dialysis  Review of Systems  Positive: wound Negative: fevers  Physical Exam  BP 99/61   Pulse 79   Temp 97.6 F (36.4 C)   Resp 17   SpO2 97%  Gen:   Awake, no distress   Resp:  Normal effort  MSK:   Moves extremities without difficulty  Other:  +abscess to L groin w/packing  Medical Decision Making  Medically screening exam initiated at 2:04 PM.  Appropriate orders placed.  Joshua Riley was informed that the remainder of the evaluation will be completed by another provider, this initial triage assessment does not replace that evaluation, and the importance of remaining in the ED until their evaluation is complete.    Joshua Riley, [place] 07/07/23 1404

## 2023-07-07 NOTE — H&P (Signed)
 History and Physical      Joshua Riley FMW:[id number] DOB: 1935/11/11 DOA: 07/07/2023; DOS: 07/07/2023  PCP: Katrinka Garnette KIDD, MD  Patient coming from: home   I have personally briefly reviewed patient's old medical records in [facility]  Chief Complaint: Left groin abscess  HPI: Joshua Riley is a 87 y.o. male with medical history significant for end-stage renal disease on hemodialysis on Monday, sigmoid Friday, ulcerative colitis status post total proctocolectomy with diverting ileostomy, paroxysmal atrial fibrillation chronically anticoagulated on Eliquis , COPD, chronic diastolic heart failure, anemia of chronic disease associated baseline hemoglobin 8-11, who is admitted to [facility] on 07/07/2023 with severe sepsis due to left groin abscess after presenting from home to [facility] ED complaining of left groin abscess.   The patient reports that he was diagnosed with a left groin abscess approximately 2 weeks ago, and has noted no significant improvement in the associated left groin erythema, tenderness, swelling over the ensuing 2 weeks while compliantly completing a course of doxycycline  as an outpatient.  In the setting, he presented to general surgery clinic today, at which time Dr. Ann performed I&D and packing of the left groin abscess, before recommending to the patient that he present to the emergency department for admission for IV antibiotics in the setting of failure of outpatient oral antibiotics for his left groin abscess.  The patient denies subjective fever, chills or rigors, generalized myalgias.  Reports mild chronic shortness of breath, without any recent worsening thereof.  No recent headache, neck stiffness, cough.  No recent chest pain.   He has a history of end-stage renal disease on hemodialysis on Monday: Weds, Friday schedule.  He notes that due to the holiday, he completed his most recent hemodialysis session earlier today, Tuesday, 07/07/2023.  In  this context he reports chronically soft blood pressures, for which he is on midodrine .  Medical history also notable for paroxysmal atrial fibrillation for which she is chronically anticoagulated on Eliquis .     ED Course:  Vital signs in the ED were notable for the following: Afebrile; heart rate in the 60s to 80s; systolic blood pressures in the 90s to low 100s mmHg; respiratory rate 16-22, oxygen saturation 97 to 100% on room air.  Labs were notable for the following: CMP notable for the following: Potassium 4.5, bicarbonate 27, liver enzymes within normal limits.  Initial lactic acid 2.64 x 2.8 followed by 5.1, before trending down to 1.5.  CBC notable for white cell count 15,800 with 89% neutrophils, hemoglobin 10.6 with macrocytic findings, and relative demonstrates a prior hemoglobin of 10.9 on 04/10/2023 completely count 149.  Will cultures x 2 were collected prior to initiation of antibiotics.  COVID, influenza, RSV PCR all negative.  Per my interpretation, EKG in ED demonstrated the following: No EKG performed in the ED today.  Imaging in the ED, per corresponding formal radiology read, was notable for the following: 1 view chest x-ray showed no evidence of acute cardiopulmonary process, including no evidence of endotracheal edema, effusion, or pneumothorax.  CT abdomen/pelvis showed chronic irregular thick walled presacral space, along with status post total proctocolectomy with end ileostomy.  While in the ED, the following were administered: Linezolid , Zosyn , normal saline x 1 L bolus.  Subsequently, the patient was admitted for further evaluation management of severe sepsis due to left groin abscess.    Review of Systems: As per HPI otherwise 10 point review of systems negative.   Past Medical History:  Diagnosis Date  A-fib (HCC)    Anemia    Arthritis    Cancer (HCC)    Basal cell   COVID-19    2021   Dyspnea    Dysrhythmia    Afib-controlled on eliquis    ESRD  (end stage renal disease) (HCC) 10/22/2021   Glaucoma 11/18/2021   History of DVT (deep vein thrombosis)    Hydronephrosis    managed wtih a PCN   Idiopathic neuropathy 10/22/2021   lyrica     Ileostomy in place [facility])    Obstructive uropathy    With chronic left nephrostomy   Old retinal detachment, total or subtotal    Orthostatic hypotension 10/22/2021   Sleep apnea    does not need a machine   Stroke (HCC)    Ulcerative colitis (HCC)    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    Past Surgical History:  Procedure Laterality Date   BASAL CELL CARCINOMA EXCISION     10/23   COLON SURGERY     creation of j pouch     and subsequent takedown of j pouch   EYE SURGERY     IR NEPHROSTOMY EXCHANGE LEFT  12/10/2021   IR NEPHROSTOMY EXCHANGE LEFT  04/22/2022   IR NEPHROSTOMY EXCHANGE LEFT  07/29/2022   IR NEPHROSTOMY EXCHANGE LEFT  10/28/2022   IR NEPHROSTOMY EXCHANGE LEFT  02/05/2023   IR NEPHROSTOMY EXCHANGE LEFT  05/07/2023   IR NEPHROSTOMY EXCHANGE LEFT  06/25/2023   LEFT HEART CATH AND CORONARY ANGIOGRAPHY N/A 07/22/2022   Procedure: LEFT HEART CATH AND CORONARY ANGIOGRAPHY;  Surgeon: Jordan, Peter M, MD;  Location: [facility] INVASIVE CV LAB;  Service: Cardiovascular;  Laterality: N/A;   REVISION OF ARTERIOVENOUS GORETEX GRAFT Left 05/06/2022   Procedure: REDO LEFT THIGH ARTERIOVENOUS 4-7 MM GORETEX GRAFT;  Surgeon: Eliza Lonni RAMAN, MD;  Location: [facility] OR;  Service: Vascular;  Laterality: Left;   SMALL INTESTINE SURGERY     TOTAL COLECTOMY      Social History:  reports that he quit smoking about 40 years ago. His smoking use included cigarettes. He started smoking about 46 years ago. He has a 12 pack-year smoking history. He has never been exposed to tobacco smoke. He has never used smokeless tobacco. He reports current alcohol use of about 5.0 standard drinks of alcohol per week. He reports that he does not use drugs.   Allergies  Allergen Reactions    Cephalosporins Rash   Ciprofloxacin Itching and Rash   Baclofen Other (See Comments)    Altered mental status, after accidental overdose      Family History  Problem Relation Age of Onset   Stroke Mother    Cancer Father    Esophageal cancer Brother     Family history reviewed and not pertinent    Prior to Admission medications   Medication Sig Start Date End Date Taking? Authorizing Provider  albuterol  (PROVENTIL ) (2.5 MG/3ML) 0.083% nebulizer solution Take 3 mLs (2.5 mg total) by nebulization every 6 (six) hours as needed for wheezing or shortness of breath. 06/17/23   Hope Almarie ORN, NP  budesonide  (PULMICORT ) 0.5 MG/2ML nebulizer solution TAKE 2 ML (0.5 MG TOTAL) BY NEBULIZATION TWICE A DAY 06/29/23   Hope Almarie ORN, NP  calcitRIOL  (ROCALTROL ) 0.25 MCG capsule Take 1 mcg by mouth daily.    [provider]  Cyanocobalamin  (B-12) 1000 MCG SUBL Place 1 tablet under the tongue daily at 6 (six) AM. 04/08/23   Jesus Bernardino MATSU, MD  cyanocobalamin  (VITAMIN B12) 1000 MCG tablet Take 1,000 mcg by mouth daily.    [provider]  ELIQUIS  2.5 MG TABS tablet Take 1 tablet (2.5 mg total) by mouth 2 (two) times daily. 08/07/22   Hobart Powell BRAVO, MD  latanoprost  (XALATAN ) 0.005 % ophthalmic solution Place 1 drop into the right eye at bedtime.    [provider]  mesalamine (LIALDA) 1.2 g EC tablet Take 2.4 g by mouth daily.    [provider]  midodrine  (PROAMATINE ) 10 MG tablet Take 1 tablet (10 mg total) by mouth 3 (three) times daily. Takes 5 mg plus 10mg  for 15 mg total three times a day- started by nephrology in [place]  08/07/22   Hobart Powell BRAVO, MD  midodrine  (PROAMATINE ) 5 MG tablet TAKE 1 TABLET 3 TIMES DAILY WITH MEALS. TAKES 5 MG PLUS 10MG  FOR 15 MG TOTAL THREE TIMES A DAY- STARTED BY NEPHROLOGY IN [place]  10/20/22   Katrinka Garnette KIDD, MD  ramelteon  (ROZEREM ) 8 MG tablet Take 1 tablet (8 mg total) by mouth at bedtime. 06/15/23   Katrinka Garnette KIDD, MD  sertraline  (ZOLOFT ) 25 MG tablet Take 1 tablet (25 mg total) by mouth daily. 04/08/23   Jesus Bernardino MATSU, MD  sevelamer  carbonate (RENVELA ) 800 MG tablet Take 2,400 mg by mouth 3 (three) times daily with meals.    [provider]  sodium zirconium cyclosilicate  (LOKELMA ) 10 g PACK packet Take 10 g by mouth 4 (four) times a week. Non Dialysis days- Earlean Everts, Saturday, Sunday    [provider]  timolol  (TIMOPTIC ) 0.5 % ophthalmic solution Place 1 drop into both eyes daily.    [provider]  traMADol  (ULTRAM ) 50 MG tablet Take 1 tablet (50 mg total) by mouth every 8 (eight) hours as needed (prn pain). 06/26/23   Lucius Krabbe, NP     Objective    Physical Exam: Vitals:   07/07/23 1645 07/07/23 1700 07/07/23 1900 07/07/23 1930  BP: (!) 97/50 (!) 99/56 91/65 92/61   Pulse: (!) 55 77 84 74  Resp: (!) 26 19 18 17   Temp:      SpO2: 100% 100% 100% 100%  Weight:      Height:        General: appears to be stated age; alert, oriented Skin: warm, dry, no rash Head:  AT/[place] Mouth:  Oral mucosa membranes appear dry, normal dentition Neck: supple; trachea midline Heart:  RRR; did not appreciate any M/R/G Lungs: CTAB, did not appreciate any wheezes, rales, or rhonchi Abdomen: + BS; soft, ND, NT Vascular: 2+ pedal pulses b/l; 2+ radial pulses b/l Extremities: Dressing over left groin area ; no muscle wasting Neuro: strength and sensation intact in upper and lower extremities b/l    Labs on Admission: I have personally reviewed following labs and imaging studies  CBC: Recent Labs  Lab 07/07/23 1407  WBC 15.8*  NEUTROABS 14.1*  HGB 10.0*  HCT 30.8*  MCV 126.7*  PLT 149*   Basic Metabolic Panel: Recent Labs  Lab 07/07/23 1407  NA 135  K 4.5  CL 94*  CO2 27  GLUCOSE 114*  BUN 30*  CREATININE 5.39*  CALCIUM  9.4   GFR: Estimated Creatinine Clearance: 8.7 mL/min (A) (by C-G formula based on SCr of 5.39 mg/dL (H)). Liver  Function Tests: Recent Labs  Lab 07/07/23 1407  AST 22  ALT 22  ALKPHOS 85  BILITOT 0.7  PROT 7.1  ALBUMIN  3.0*   No results for input(s): LIPASE, AMYLASE  in the last 168 hours. No results for input(s): AMMONIA in the last 168 hours. Coagulation Profile: No results for input(s): INR, PROTIME in the last 168 hours. Cardiac Enzymes: No results for input(s): CKTOTAL, CKMB, CKMBINDEX, TROPONINI in the last 168 hours. BNP (last 3 results) No results for input(s): PROBNP in the last 8760 hours. HbA1C: No results for input(s): HGBA1C in the last 72 hours. CBG: No results for input(s): GLUCAP in the last 168 hours. Lipid Profile: No results for input(s): CHOL, HDL, LDLCALC, TRIG, CHOLHDL, LDLDIRECT in the last 72 hours. Thyroid Function Tests: No results for input(s): TSH, T4TOTAL, FREET4, T3FREE, THYROIDAB in the last 72 hours. Anemia Panel: No results for input(s): VITAMINB12, FOLATE, FERRITIN, TIBC, IRON, RETICCTPCT in the last 72 hours. Urine analysis:    Component Value Date/Time   COLORURINE YELLOW (A) 06/02/2022 1221   APPEARANCEUR TURBID (A) 06/02/2022 1221   LABSPEC  06/02/2022 1221    TEST NOT REPORTED DUE TO COLOR INTERFERENCE OF URINE PIGMENT   PHURINE  06/02/2022 1221    TEST NOT REPORTED DUE TO COLOR INTERFERENCE OF URINE PIGMENT   GLUCOSEU (A) 06/02/2022 1221    TEST NOT REPORTED DUE TO COLOR INTERFERENCE OF URINE PIGMENT   HGBUR (A) 06/02/2022 1221    TEST NOT REPORTED DUE TO COLOR INTERFERENCE OF URINE PIGMENT   BILIRUBINUR (A) 06/02/2022 1221    TEST NOT REPORTED DUE TO COLOR INTERFERENCE OF URINE PIGMENT   KETONESUR (A) 06/02/2022 1221    TEST NOT REPORTED DUE TO COLOR INTERFERENCE OF URINE PIGMENT   PROTEINUR (A) 06/02/2022 1221    TEST NOT REPORTED DUE TO COLOR INTERFERENCE OF URINE PIGMENT   NITRITE (A) 06/02/2022 1221    TEST NOT REPORTED DUE TO COLOR INTERFERENCE OF URINE PIGMENT   LEUKOCYTESUR  (A) 06/02/2022 1221    TEST NOT REPORTED DUE TO COLOR INTERFERENCE OF URINE PIGMENT    Radiological Exams on Admission: CT ABDOMEN PELVIS W CONTRAST Result Date: 07/07/2023 CLINICAL DATA:  Groin abscess.  Dyspnea. EXAM: CT ABDOMEN AND PELVIS WITH CONTRAST TECHNIQUE: Multidetector CT imaging of the abdomen and pelvis was performed using the standard protocol following bolus administration of intravenous contrast. RADIATION DOSE REDUCTION: This exam was performed according to the departmental dose-optimization program which includes automated exposure control, adjustment of the mA and/or kV according to patient size and/or use of iterative reconstruction technique. CONTRAST:  75mL OMNIPAQUE  IOHEXOL  350 MG/ML SOLN COMPARISON:  06/11/2023 CT abdomen/pelvis. FINDINGS: Lower chest: No acute abnormality at the lung bases. Hepatobiliary: Normal liver size. No liver mass. Cholelithiasis. No gallbladder distention. No definite gallbladder wall thickening. No pericholecystic fluid. No biliary ductal dilatation. Pancreas: Generalized pancreatic atrophy with no discrete mass or significant duct dilation, unchanged since baseline 11/17/2021 CT. Spleen: Normal size. No mass. Adrenals/Urinary Tract: Normal adrenals. Severe bilateral renal atrophy, unchanged. Simple 1.4 cm posterior upper right renal cyst and exophytic simple 1.5 cm interpolar left renal cyst with additional subcentimeter hypodense left renal cortical lesions that are too small to characterize, unchanged, for which no follow-up imaging is recommended. No hydronephrosis. Left percutaneous pigtail nephrostomy tube with tip in the lower left renal collecting system. Stable relatively collapsed bladder with irregular diffuse bladder wall thickening. Stomach/Bowel: Normal non-distended stomach. 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 No  dilated or thick-walled small bowel loops. No pneumatosis. Chronic irregular thick walled presacral space 5.7 x 4.5 cm fluid collection (  series 3/image 64), extending into the left ischio-anal fat, previously 4.6 x 4.0 cm on 06/11/2023 CT, slightly increased. Vascular/Lymphatic: Atherosclerotic nonaneurysmal abdominal aorta. Patent portal, splenic, hepatic and renal veins. No pathologically enlarged lymph nodes in the abdomen or pelvis. Reproductive: Stable mild prostatomegaly. 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 Other: No pneumoperitoneum. No ascites. Partially visualized surgical grafts arising from the left common femoral artery and vein and extending inferiorly in the subcutaneous ventral left thigh with stable small 1.9 x 1.6 cm fluid collection at the anterior origin of the surgical grafts (series 3/image 73). Musculoskeletal: No aggressive appearing focal osseous lesions. Moderate thoracolumbar spondylosis. 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 IMPRESSION: 1. Chronic irregular thick walled presacral space 5.7 x 4.5 cm fluid collection, extending into the left ischio-anal fat, slightly increased in size since 06/11/2023 CT. 2. Partially visualized surgical grafts arising from the left common femoral artery and vein and extending inferiorly in the subcutaneous ventral left thigh with stable small 1.9 x 1.6 cm fluid collection at the anterior origin of the surgical grafts. 3. 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 No evidence of acute small bowel complication. 4. Cholelithiasis. 5. Left percutaneous pigtail nephrostomy tube with tip in place in the lower left renal collecting system. No hydronephrosis. 6. Stable relatively collapsed bladder with irregular diffuse bladder wall thickening, nonspecific. Mild  prostatomegaly. 7.  Aortic Atherosclerosis (ICD10-I70.0). Electronically Signed   By: Selinda DELENA Blue M.D.   On: 07/07/2023 18:25   DG Chest Portable 1 View Result Date: 07/07/2023 CLINICAL DATA:  Dyspnea EXAM: PORTABLE CHEST 1 VIEW COMPARISON:  04/10/2023 chest radiograph. FINDINGS: Vascular stent overlies the right subclavian region. Stable cardiomediastinal silhouette with normal heart size. No pneumothorax. No pleural effusion. No pulmonary edema. No acute consolidative airspace disease. Chronic streaky mild left lung base scarring versus atelectasis. IMPRESSION: No active disease. Chronic streaky mild left lung base scarring versus atelectasis. Electronically Signed   By: Selinda DELENA Blue M.D.   On: 07/07/2023 18:12      Assessment/Plan   Principal Problem:   Abscess of left groin Active Problems:   Severe sepsis (HCC)   End-stage renal disease on hemodialysis (HCC)   Depression   Leukocytosis   Paroxysmal atrial fibrillation (HCC)   History of COPD   Chronic diastolic CHF (congestive heart failure) (HCC)   History of anemia due to chronic kidney disease     #) Severe sepsis due to left groin abscess: He presents with progressive left groin abscess over the last 2 weeks, with worsening erythema, swelling, discomfort in spite of good compliance with interval doxycycline , prompting will patient I&D and packing by Dr. Ann in general surgery clinic earlier today, with ensuing associated recommendations for admission for IV antibiotics given failure of outpatient oral antibiotics.  SIRS criteria met via leukocytosis with neutrophilic predominance, tachypnea. Lactic acid level: 2.64 x 2.8 followed by 5.1, with fourth value trending down to 1.5. Of note, given the associated presence of suspected end organ damage in the form of concominant presenting elevated lactic acid level, criteria are met for pt's sepsis to be considered severe in nature.  In the setting of elevated lactate of greater  than 4.0, criteria are met for septic shock, although the patient has remained hemodynamically stable.  In this context there would be a recommendation for administration of a  30 mL/kg  IVF bolus.  However, we will refrain from administration of this volume of IV fluid given his history of end-stage renal disease as well as chronic diastolic heart failure, given significant increase in risk for development of acute volume overload given these comorbidities.   Additional ED work-up/management notable for: Blood cultures x 2 were collected prior to initiation of linezolid  and Zosyn , which will be continued for now.  No e/o additional infectious process at this time, including chest x-ray which shows no evidence of acute cardiopulmonary process, including no evidence of infiltrate, will COVID, influenza, RSV PCR were all negative.  I contacted the on-call general surgery group requesting nonurgent general surgery consultation in the morning.  Plan: CBC w/ diff and CMP in AM.  Follow for results of blood cx's x 2. Abx: Continue linezolid , Zosyn , as above.  Monitor on telemetry..  Acetaminophen  for fever/pain.  General surgery consult requested for the morning, as above.                  #) end-stage renal disease: on hemodialysis on Monday: Weds, Friday schedule.  He notes that due to the holiday, he completed his most recent hemodialysis session earlier today, Tuesday, 07/07/2023.  Outpatient medications notable for Renvela .  Plan: Monitor strict I's and O's and daily weights.  CMP in the morning.  Check magnesium  and phosphorus levels.  Resume home Renvela .                     #) Paroxysmal atrial fibrillation: Documented history of such. In setting of CHA2DS2-VASc score of  3, there is an indication for chronic anticoagulation for thromboembolic prophylaxis. Consistent with this, patient is chronically anticoagulated on Eliquis . Home AV nodal blocking regimen: None.   Most recent echocardiogram was performed in November 2023, with results that include trivial mitral gravitation, mild aortic regurgitation, with additional results as conveyed above.  Plan: monitor strict I's & O's and daily weights. CMP/CBC in AM. Check serum mag level.  Continue outpatient Eliquis .  Monitor on telemetry.                     #) COPD: Documented history thereof, without clinical evidence of acute exacerbation at this time.  Outpatient respiratory regimen includes the following:  Wixela as well as prn albuterol  inhaler.   Plan: cont outpatient Wixela. Prn albuterol  nebulizer. Check CMP and serum magnesium  level in the AM.                   #) Depression: documented h/o such. On Zoloft  as outpatient.    Plan: Continue outpatient Zoloft .                    #) Chronic diastolic heart failure: documented history of such, with most recent echocardiogram performed in November 2023 which is notable for LVEF 66 5%, grade 1 diastolic dysfunction and normal right ventricular systolic function. No clinical or radiographic evidence to suggest acutely decompensated heart failure at this time.   Plan: monitor strict I's & O's and daily weights. Repeat CMP in AM. Check serum mag level.                    #) Anemia of chronic kidney disease: Documented history of such, a/w with baseline hgb range 8-11, with presenting hgb consistent with this range.  All documented history is of anemia of chronic kidney disease, presenting macrocytosis is noted.   Plan: Repeat CBC in  the morning.  Check B12 level as well as folic acid  level.      DVT prophylaxis: SCD's plus continuation of outpatient Eliquis  Code Status: Full code Family Communication: none Disposition Plan: Per Rounding Team Consults called: I have contacted on-call general surgery group requesting nonurgent consult for the morning of 07/08/2023, as further detailed above;   Admission status: Inpatient     I SPENT GREATER THAN 75  MINUTES IN CLINICAL CARE TIME/MEDICAL DECISION-MAKING IN COMPLETING THIS ADMISSION.      Eva NOVAK Evans Levee DO [facility]  From 7PM - 7AM   07/07/2023, 9:21 PM

## 2023-07-08 ENCOUNTER — Encounter (HOSPITAL_COMMUNITY): Payer: Self-pay | Admitting: Internal Medicine

## 2023-07-08 ENCOUNTER — Other Ambulatory Visit: Payer: Self-pay | Admitting: Family Medicine

## 2023-07-08 DIAGNOSIS — D72829 Elevated white blood cell count, unspecified: Secondary | ICD-10-CM | POA: Diagnosis present

## 2023-07-08 DIAGNOSIS — L02214 Cutaneous abscess of groin: Secondary | ICD-10-CM | POA: Diagnosis not present

## 2023-07-08 DIAGNOSIS — I480 Paroxysmal atrial fibrillation: Secondary | ICD-10-CM | POA: Diagnosis present

## 2023-07-08 DIAGNOSIS — N189 Chronic kidney disease, unspecified: Secondary | ICD-10-CM

## 2023-07-08 DIAGNOSIS — Z8709 Personal history of other diseases of the respiratory system: Secondary | ICD-10-CM

## 2023-07-08 DIAGNOSIS — I5032 Chronic diastolic (congestive) heart failure: Secondary | ICD-10-CM | POA: Diagnosis present

## 2023-07-08 LAB — CBC WITH DIFFERENTIAL/PLATELET
Abs Immature Granulocytes: 0.1 10*3/uL — ABNORMAL HIGH (ref 0.00–0.07)
Basophils Absolute: 0 10*3/uL (ref 0.0–0.1)
Basophils Relative: 0 %
Eosinophils Absolute: 0.1 10*3/uL (ref 0.0–0.5)
Eosinophils Relative: 1 %
HCT: 26.3 % — ABNORMAL LOW (ref 39.0–52.0)
Hemoglobin: 8.6 g/dL — ABNORMAL LOW (ref 13.0–17.0)
Immature Granulocytes: 1 %
Lymphocytes Relative: 7 %
Lymphs Abs: 0.8 10*3/uL (ref 0.7–4.0)
MCH: 41.3 pg — ABNORMAL HIGH (ref 26.0–34.0)
MCHC: 32.7 g/dL (ref 30.0–36.0)
MCV: 126.4 fL — ABNORMAL HIGH (ref 80.0–100.0)
Monocytes Absolute: 0.9 10*3/uL (ref 0.1–1.0)
Monocytes Relative: 8 %
Neutro Abs: 9.8 10*3/uL — ABNORMAL HIGH (ref 1.7–7.7)
Neutrophils Relative %: 83 %
Platelets: 128 10*3/uL — ABNORMAL LOW (ref 150–400)
RBC: 2.08 MIL/uL — ABNORMAL LOW (ref 4.22–5.81)
RDW: 15.8 % — ABNORMAL HIGH (ref 11.5–15.5)
WBC: 11.8 10*3/uL — ABNORMAL HIGH (ref 4.0–10.5)
nRBC: 0 % (ref 0.0–0.2)

## 2023-07-08 LAB — COMPREHENSIVE METABOLIC PANEL
ALT: 18 U/L (ref 0–44)
AST: 17 U/L (ref 15–41)
Albumin: 2.6 g/dL — ABNORMAL LOW (ref 3.5–5.0)
Alkaline Phosphatase: 71 U/L (ref 38–126)
Anion gap: 14 (ref 5–15)
BUN: 38 mg/dL — ABNORMAL HIGH (ref 8–23)
CO2: 24 mmol/L (ref 22–32)
Calcium: 8.8 mg/dL — ABNORMAL LOW (ref 8.9–10.3)
Chloride: 93 mmol/L — ABNORMAL LOW (ref 98–111)
Creatinine, Ser: 6.42 mg/dL — ABNORMAL HIGH (ref 0.61–1.24)
GFR, Estimated: 8 mL/min — ABNORMAL LOW (ref >60)
Glucose, Bld: 88 mg/dL (ref 70–99)
Potassium: 4.5 mmol/L (ref 3.5–5.1)
Sodium: 131 mmol/L — ABNORMAL LOW (ref 135–145)
Total Bilirubin: 0.7 mg/dL (ref 0.0–1.2)
Total Protein: 6 g/dL — ABNORMAL LOW (ref 6.5–8.1)

## 2023-07-08 LAB — MAGNESIUM: Magnesium: 1.9 mg/dL (ref 1.7–2.4)

## 2023-07-08 LAB — VITAMIN B12: Vitamin B-12: 3190 pg/mL — ABNORMAL HIGH (ref 180–914)

## 2023-07-08 LAB — FOLATE: Folate: 12.9 ng/mL

## 2023-07-08 LAB — PHOSPHORUS: Phosphorus: 6.2 mg/dL — ABNORMAL HIGH (ref 2.5–4.6)

## 2023-07-08 MED ORDER — PIPERACILLIN-TAZOBACTAM 3.375 G IVPB
3.3750 g | Freq: Two times a day (BID) | INTRAVENOUS | Status: DC
  Administered 2023-07-08: 3.375 g via INTRAVENOUS
  Filled 2023-07-08 (×2): qty 50

## 2023-07-08 MED ORDER — ALBUTEROL SULFATE (2.5 MG/3ML) 0.083% IN NEBU: 2.5000 mg | INHALATION_SOLUTION | RESPIRATORY_TRACT | Status: DC | PRN

## 2023-07-08 MED ORDER — SERTRALINE HCL 50 MG PO TABS
25.0000 mg | ORAL_TABLET | Freq: Every day | ORAL | Status: DC
  Administered 2023-07-09 – 2023-07-20 (×12): 25 mg via ORAL
  Filled 2023-07-08 (×12): qty 1

## 2023-07-08 MED ORDER — SERTRALINE HCL 50 MG PO TABS: 25.0000 mg | ORAL_TABLET | Freq: Every day | ORAL | Status: DC

## 2023-07-08 MED ORDER — APIXABAN 2.5 MG PO TABS
2.5000 mg | ORAL_TABLET | Freq: Two times a day (BID) | ORAL | Status: DC
  Administered 2023-07-08 – 2023-07-10 (×5): 2.5 mg via ORAL
  Filled 2023-07-08 (×5): qty 1

## 2023-07-08 MED ORDER — ORAL CARE MOUTH RINSE: 15.0000 mL | OROMUCOSAL | Status: DC | PRN

## 2023-07-08 MED ORDER — SEVELAMER CARBONATE 800 MG PO TABS
2400.0000 mg | ORAL_TABLET | Freq: Three times a day (TID) | ORAL | Status: DC
  Administered 2023-07-08 – 2023-07-18 (×13): 2400 mg via ORAL
  Filled 2023-07-08 (×19): qty 3

## 2023-07-08 MED ORDER — PIPERACILLIN-TAZOBACTAM IN DEX 2-0.25 GM/50ML IV SOLN
2.2500 g | Freq: Three times a day (TID) | INTRAVENOUS | Status: DC
  Administered 2023-07-08 – 2023-07-10 (×5): 2.25 g via INTRAVENOUS
  Filled 2023-07-08 (×6): qty 50

## 2023-07-08 MED ORDER — TRAMADOL HCL 50 MG PO TABS
50.0000 mg | ORAL_TABLET | Freq: Three times a day (TID) | ORAL | Status: DC | PRN
  Administered 2023-07-08 – 2023-07-10 (×6): 50 mg via ORAL
  Filled 2023-07-08 (×6): qty 1

## 2023-07-08 MED ORDER — MOMETASONE FURO-FORMOTEROL FUM 200-5 MCG/ACT IN AERO
2.0000 | INHALATION_SPRAY | Freq: Two times a day (BID) | RESPIRATORY_TRACT | Status: DC
  Administered 2023-07-08 – 2023-07-20 (×22): 2 via RESPIRATORY_TRACT
  Filled 2023-07-08 (×2): qty 8.8

## 2023-07-08 NOTE — Progress Notes (Signed)
 Progress Note     Subjective: Having some soreness at I&D site   Objective: Vital signs in last 24 hours: Temp:  [97.4 F (36.3 C)-98.4 F (36.9 C)] 97.8 F (36.6 C) (01/01 1145) Pulse Rate:  [35-122] 58 (01/01 1130) Resp:  [15-29] 18 (01/01 1130) BP: (82-113)/(45-94) 92/51 (01/01 1130) SpO2:  [74 %-100 %] 100 % (01/01 1130) Weight:  [67.1 kg] 67.1 kg (12/31 1536)    Intake/Output from previous day: 12/31 0701 - 01/01 0700 In: 1344.2 [IV Piggyback:1344.2] Out: -  Intake/Output this shift: No intake/output data recorded.  PE: General: pleasant, WD, male who is laying in bed in NAD HEENT: head is normocephalic, atraumatic.  Sclera are noninjected.  Pupils equal and round. EOMs intact.  Ears and nose without any masses or lesions.  Mouth is pink and moist Lungs; Respiratory effort nonlabored MSK: all 4 extremities are symmetrical with no cyanosis, clubbing, or edema. GU: L groin wound with ongoing purulent drainage. Mild surrounding erythema without induration or necrosis Skin: warm and dry wi Psych: A&Ox3 with an appropriate affect.   Seen with bedside RN as chaperone  Lab Results:  Recent Labs    07/07/23 1407 07/08/23 0527  WBC 15.8* 11.8*  HGB 10.0* 8.6*  HCT 30.8* 26.3*  PLT 149* 128*   BMET Recent Labs    07/07/23 1407 07/08/23 0527  NA 135 131*  K 4.5 4.5  CL 94* 93*  CO2 27 24  GLUCOSE 114* 88  BUN 30* 38*  CREATININE 5.39* 6.42*  CALCIUM  9.4 8.8*   PT/INR No results for input(s): LABPROT, INR in the last 72 hours. CMP     Component Value Date/Time   NA 131 (L) 07/08/2023 0527   NA 138 04/10/2023 1541   K 4.5 07/08/2023 0527   CL 93 (L) 07/08/2023 0527   CO2 24 07/08/2023 0527   GLUCOSE 88 07/08/2023 0527   BUN 38 (H) 07/08/2023 0527   BUN 19 04/10/2023 1541   CREATININE 6.42 (H) 07/08/2023 0527   CALCIUM  8.8 (L) 07/08/2023 0527   PROT 6.0 (L) 07/08/2023 0527   ALBUMIN  2.6 (L) 07/08/2023 0527   AST 17 07/08/2023 0527   ALT  18 07/08/2023 0527   ALKPHOS 71 07/08/2023 0527   BILITOT 0.7 07/08/2023 0527   GFRNONAA 8 (L) 07/08/2023 0527   Lipase     Component Value Date/Time   LIPASE 70 (H) 11/17/2021 2211       Studies/Results: CT ABDOMEN PELVIS W CONTRAST Result Date: 07/07/2023 CLINICAL DATA:  Groin abscess.  Dyspnea. EXAM: CT ABDOMEN AND PELVIS WITH CONTRAST TECHNIQUE: Multidetector CT imaging of the abdomen and pelvis was performed using the standard protocol following bolus administration of intravenous contrast. RADIATION DOSE REDUCTION: This exam was performed according to the departmental dose-optimization program which includes automated exposure control, adjustment of the mA and/or kV according to patient size and/or use of iterative reconstruction technique. CONTRAST:  75mL OMNIPAQUE  IOHEXOL  350 MG/ML SOLN COMPARISON:  06/11/2023 CT abdomen/pelvis. FINDINGS: Lower chest: No acute abnormality at the lung bases. Hepatobiliary: Normal liver size. No liver mass. Cholelithiasis. No gallbladder distention. No definite gallbladder wall thickening. No pericholecystic fluid. No biliary ductal dilatation. Pancreas: Generalized pancreatic atrophy with no discrete mass or significant duct dilation, unchanged since baseline 11/17/2021 CT. Spleen: Normal size. No mass. Adrenals/Urinary Tract: Normal adrenals. Severe bilateral renal atrophy, unchanged. Simple 1.4 cm posterior upper right renal cyst and exophytic simple 1.5 cm interpolar left renal cyst with additional subcentimeter hypodense left renal  cortical lesions that are too small to characterize, unchanged, for which no follow-up imaging is recommended. No hydronephrosis. Left percutaneous pigtail nephrostomy tube with tip in the lower left renal collecting system. Stable relatively collapsed bladder with irregular diffuse bladder wall thickening. Stomach/Bowel: Normal non-distended stomach. 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 No dilated or thick-walled small bowel loops. No pneumatosis. Chronic irregular thick walled presacral space 5.7 x 4.5 cm fluid collection (series 3/image 64), extending into the left ischio-anal fat, previously 4.6 x 4.0 cm on 06/11/2023 CT, slightly increased. Vascular/Lymphatic: Atherosclerotic nonaneurysmal abdominal aorta. Patent portal, splenic, hepatic and renal veins. No pathologically enlarged lymph nodes in the abdomen or pelvis. Reproductive: Stable mild prostatomegaly. 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 Other: No pneumoperitoneum. No ascites. Partially visualized surgical grafts arising from the left common femoral artery and vein and extending inferiorly in the subcutaneous ventral left thigh with stable small 1.9 x 1.6 cm fluid collection at the anterior origin of the surgical grafts (series 3/image 73). Musculoskeletal: No aggressive appearing focal osseous lesions. Moderate thoracolumbar spondylosis. 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 IMPRESSION: 1. Chronic irregular thick walled presacral space 5.7 x 4.5 cm fluid collection, extending into the left ischio-anal fat, slightly increased in size since 06/11/2023 CT. 2. Partially visualized surgical grafts arising from the left common femoral artery and vein and extending inferiorly in the subcutaneous ventral left thigh with stable small 1.9 x 1.6 cm fluid collection at the anterior origin of the surgical grafts. 3. 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 No evidence of acute small bowel complication. 4. Cholelithiasis. 5. Left percutaneous pigtail nephrostomy tube with tip in place in the lower left renal collecting system. No hydronephrosis. 6. Stable  relatively collapsed bladder with irregular diffuse bladder wall thickening, nonspecific. Mild prostatomegaly. 7.  Aortic Atherosclerosis (ICD10-I70.0). Electronically Signed   By: Selinda DELENA Blue M.D.   On: 07/07/2023 18:25   DG Chest Portable 1 View Result Date: 07/07/2023 CLINICAL DATA:  Dyspnea EXAM: PORTABLE CHEST 1 VIEW COMPARISON:  04/10/2023 chest radiograph. FINDINGS: Vascular stent overlies the right subclavian region. Stable cardiomediastinal silhouette with normal heart size. No pneumothorax. No pleural effusion. No pulmonary edema. No acute consolidative airspace disease. Chronic streaky mild left lung base scarring versus atelectasis. IMPRESSION: No active disease. Chronic streaky mild left lung base scarring versus atelectasis. Electronically Signed   By: Selinda DELENA Blue M.D.   On: 07/07/2023 18:12    Anti-infectives: Anti-infectives (From admission, onward)    Start     Dose/Rate Route Frequency Ordered Stop   07/08/23 0600  piperacillin -tazobactam (ZOSYN ) IVPB 3.375 g        3.375 g 12.5 mL/hr over 240 Minutes Intravenous Every 12 hours 07/08/23 0518     07/08/23 0200  linezolid  (ZYVOX ) IVPB 600 mg        600 mg 300 mL/hr over 60 Minutes Intravenous Every 12 hours 07/07/23 2117     07/07/23 2300  piperacillin -tazobactam (ZOSYN ) IVPB 2.25 g  Status:  Discontinued        2.25 g 100 mL/hr over 30 Minutes Intravenous Every 8 hours 07/07/23 2121 07/08/23 0517   07/07/23 1415  piperacillin -tazobactam (ZOSYN ) IVPB 3.375 g        3.375 g 100 mL/hr  over 30 Minutes Intravenous  Once 07/07/23 1404 07/07/23 1555   07/07/23 1415  clindamycin  (CLEOCIN ) IVPB 900 mg  Status:  Discontinued        900 mg 100 mL/hr over 30 Minutes Intravenous  Once 07/07/23 1404 07/07/23 1414   07/07/23 1415  linezolid  (ZYVOX ) IVPB 600 mg        600 mg 300 mL/hr over 60 Minutes Intravenous STAT 07/07/23 1414 07/07/23 1911        Assessment/Plan  L groin abscess S/p I&D 12/31 Dr. Ann in clinic On abx  and leukocytosis improving Follow wound culture collected in clinic Bid wet to dry dressing changes Trend WBC and HGB (10>8 this am)  FEN: reg ID: [email or web address]  VTE: [email or web address]   I reviewed hospitalist notes, last 24 h vitals and pain scores, last 48 h intake and output, last 24 h labs and trends, and last 24 h imaging results.    LOS: 1 day   [place] VEAR Mais, [facility] 07/08/2023, 11:59 AM Please see Amion for pager number during day hours 7:00am-4:30pm

## 2023-07-08 NOTE — Progress Notes (Signed)
 Patient arrived from ED complaining of pain in left groin (6/10).  Patient has a nephrostomy tube in his left side, urine in tube is purulent.  Patient has a RLQ colostomy bag - which was full of stool ( emptied).  Patient also has an adult brief on, due to rectal bleeding.  Upon this nurse's assessment, brief is soiled with bright, red blood.  Wound care consult placed.      07/08/23 1410  Vitals  Temp 97.8 F (36.6 C)  Temp Source Oral  BP (!) 100/50  MAP (mmHg) 67  BP Location Left Arm  BP Method Automatic  Patient Position (if appropriate) Lying  Pulse Rate 61  Pulse Rate Source Monitor  ECG Heart Rate 63  Resp (!) 23  Level of Consciousness  Level of Consciousness Alert  MEWS COLOR  MEWS Score Color Yellow  Oxygen Therapy  SpO2 100 %  O2 Device Room Air  Pain Assessment  Pain Scale 0-10  Pain Score 6  Pain Type Acute pain  Pain Location Groin  Pain Orientation Left  MEWS Score  MEWS Temp 0  MEWS Systolic 1  MEWS Pulse 0  MEWS RR 1  MEWS LOC 0  MEWS Score 2

## 2023-07-08 NOTE — ED Notes (Addendum)
 ED TO INPATIENT HANDOFF REPORT  ED Nurse Name and Phone #: Robina Hamor [phone]  S Name/Age/Gender Joshua Riley 88 y.o. male Room/Bed: 034C/034C  Code Status   Code Status: Full Code  Home/SNF/Other Home Patient oriented to: self, place, time, and situation Is this baseline? Yes   Triage Complete: Triage complete  Chief Complaint Abscess of left groin [L02.214]  Triage Note Pt is here to have a boil checked on his left inner groin. Pt also reports rectal bleeding for several months and shortness of breath now. Pt is alert, HOH, and has left nephrostomy tube draining urine and has RLQ colostomy. Pt has a right upper thigh fistula and states that he just finished HD prior to coming to here. Usually MWF but change due to holiday   Allergies Allergies  Allergen Reactions   Cephalosporins Rash   Ciprofloxacin Itching and Rash   Baclofen Other (See Comments)    Altered mental status, after accidental overdose      Level of Care/Admitting Diagnosis ED Disposition     ED Disposition  Admit   Condition  --   Comment  Hospital Area: [facility] [[id number]]  Level of Care: Progressive [102]  Admit to Progressive based on following criteria: MULTISYSTEM THREATS such as stable sepsis, metabolic/electrolyte imbalance with or without encephalopathy that is responding to early treatment.  May admit patient to [name] or [facility] if equivalent level of care is available:: No  Covid Evaluation: Asymptomatic - no recent exposure (last 10 days) testing not required  Diagnosis: Abscess of left groin [353594]  Admitting Physician: HOWERTER, JUSTIN B [[id number]]  Attending Physician: HOWERTER, JUSTIN B [[id number]]  Certification:: I certify this patient will need inpatient services for at least 2 midnights  Expected Medical Readiness: 07/09/2023          B Medical/Surgery History Past Medical History:  Diagnosis Date   A-fib (HCC)    Anemia    Arthritis    Cancer  (HCC)    Basal cell   COVID-19    2021   Dyspnea    Dysrhythmia    Afib-controlled on eliquis    ESRD (end stage renal disease) (HCC) 10/22/2021   Glaucoma 11/18/2021   History of DVT (deep vein thrombosis)    Hydronephrosis    managed wtih a PCN   Idiopathic neuropathy 10/22/2021   lyrica     Ileostomy in place [facility])    Obstructive uropathy    With chronic left nephrostomy   Old retinal detachment, total or subtotal    Orthostatic hypotension 10/22/2021   Sleep apnea    does not need a machine   Stroke (HCC)    Ulcerative colitis (HCC)    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   Past Surgical History:  Procedure Laterality Date   BASAL CELL CARCINOMA EXCISION     10/23   COLON SURGERY     creation of j pouch     and subsequent takedown of j pouch   EYE SURGERY     IR NEPHROSTOMY EXCHANGE LEFT  12/10/2021   IR NEPHROSTOMY EXCHANGE LEFT  04/22/2022   IR NEPHROSTOMY EXCHANGE LEFT  07/29/2022   IR NEPHROSTOMY EXCHANGE LEFT  10/28/2022   IR NEPHROSTOMY EXCHANGE LEFT  02/05/2023   IR NEPHROSTOMY EXCHANGE LEFT  05/07/2023   IR NEPHROSTOMY EXCHANGE LEFT  06/25/2023   LEFT HEART CATH AND CORONARY ANGIOGRAPHY N/A 07/22/2022   Procedure: LEFT HEART CATH AND CORONARY ANGIOGRAPHY;  Surgeon: Jordan, Peter  M, MD;  Location: [facility] INVASIVE CV LAB;  Service: Cardiovascular;  Laterality: N/A;   REVISION OF ARTERIOVENOUS GORETEX GRAFT Left 05/06/2022   Procedure: REDO LEFT THIGH ARTERIOVENOUS 4-7 MM GORETEX GRAFT;  Surgeon: Eliza Lonni RAMAN, MD;  Location: [facility] OR;  Service: Vascular;  Laterality: Left;   SMALL INTESTINE SURGERY     TOTAL COLECTOMY       A IV Location/Drains/Wounds Patient Lines/Drains/Airways Status     Active Line/Drains/Airways     Name Placement date Placement time Site Days   Peripheral IV 07/07/23 20 G Anterior;Left Forearm 07/07/23  1421  Forearm  1   Fistula / Graft Femoral vein Arteriovenous vein graft 06/06/22  0000  Femoral vein  397    Nephrostomy Left 8.5 Fr. 06/25/23  1554  Left  13            Intake/Output Last 24 hours  Intake/Output Summary (Last 24 hours) at 07/08/2023 1303 Last data filed at 07/08/2023 0358 Gross per 24 hour  Intake 1344.18 ml  Output --  Net 1344.18 ml    Labs/Imaging Results for orders placed or performed during the hospital encounter of 07/07/23 (from the past 48 hours)  Blood culture (routine x 2)     Status: None (Preliminary result)   Collection Time: 07/07/23  2:07 PM   Specimen: BLOOD  Result Value Ref Range   Specimen Description BLOOD RIGHT ANTECUBITAL    Special Requests      BOTTLES DRAWN AEROBIC AND ANAEROBIC Blood Culture adequate volume   Culture      NO GROWTH < 24 HOURS Performed at [facility], [address], [place], [place] [address]    Report Status PENDING   CBC with Differential     Status: Abnormal   Collection Time: 07/07/23  2:07 PM  Result Value Ref Range   WBC 15.8 (H) 4.0 - 10.5 K/uL   RBC 2.43 (L) 4.22 - 5.81 MIL/uL   Hemoglobin 10.0 (L) 13.0 - 17.0 g/dL   HCT 69.1 (L) 60.9 - 47.9 %   MCV 126.7 (H) 80.0 - 100.0 fL   MCH 41.2 (H) 26.0 - 34.0 pg   MCHC 32.5 30.0 - 36.0 g/dL   RDW 83.9 (H) 88.4 - 84.4 %   Platelets 149 (L) 150 - 400 K/uL   nRBC 0.1 0.0 - 0.2 %   Neutrophils Relative % 89 %   Neutro Abs 14.1 (H) 1.7 - 7.7 K/uL   Lymphocytes Relative 6 %   Lymphs Abs 0.9 0.7 - 4.0 K/uL   Monocytes Relative 4 %   Monocytes Absolute 0.6 0.1 - 1.0 K/uL   Eosinophils Relative 0 %   Eosinophils Absolute 0.0 0.0 - 0.5 K/uL   Basophils Relative 1 %   Basophils Absolute 0.2 (H) 0.0 - 0.1 K/uL   WBC Morphology See Note     Comment: Morphology unremarkable   Smear Review See Note     Comment: Normal Platelet Morphology   nRBC 0 0 /100 WBC   Abs Immature Granulocytes 0.00 0.00 - 0.07 K/uL   Polychromasia PRESENT     Comment: Performed at [facility], [address] [place], [place] [address]  Comprehensive metabolic panel     Status:  Abnormal   Collection Time: 07/07/23  2:07 PM  Result Value Ref Range   Sodium 135 135 - 145 mmol/L   Potassium 4.5 3.5 - 5.1 mmol/L   Chloride 94 (L) 98 - 111 mmol/L  CO2 27 22 - 32 mmol/L   Glucose, Bld 114 (H) 70 - 99 mg/dL    Comment: Glucose reference range applies only to samples taken after fasting for at least 8 hours.   BUN 30 (H) 8 - 23 mg/dL   Creatinine, Ser 4.60 (H) 0.61 - 1.24 mg/dL   Calcium  9.4 8.9 - 10.3 mg/dL   Total Protein 7.1 6.5 - 8.1 g/dL   Albumin  3.0 (L) 3.5 - 5.0 g/dL   AST 22 15 - 41 U/L   ALT 22 0 - 44 U/L   Alkaline Phosphatase 85 38 - 126 U/L   Total Bilirubin 0.7 0.0 - 1.2 mg/dL   GFR, Estimated 10 (L) >60 mL/min    Comment: (NOTE) Calculated using the CKD-EPI Creatinine Equation (2021)    Anion gap 14 5 - 15    Comment: Performed at [facility], [address], [place], [place] [address]  Lactic acid, plasma     Status: Abnormal   Collection Time: 07/07/23  2:07 PM  Result Value Ref Range   Lactic Acid, Venous 2.6 (HH) 0.5 - 1.9 mmol/L    Comment: CRITICAL RESULT CALLED TO, READ BACK BY AND VERIFIED WITH K.BRANCH,RN 1503 07/07/23 CLARK,S Performed at [facility], [address], [place], [place] [address]   Magnesium      Status: None   Collection Time: 07/07/23  2:07 PM  Result Value Ref Range   Magnesium  1.9 1.7 - 2.4 mg/dL    Comment: Performed at [facility], [address] [place], [place] [address]  Blood culture (routine x 2)     Status: None (Preliminary result)   Collection Time: 07/07/23  2:08 PM   Specimen: BLOOD LEFT FOREARM  Result Value Ref Range   Specimen Description BLOOD LEFT FOREARM    Special Requests      BOTTLES DRAWN AEROBIC ONLY Blood Culture results may not be optimal due to an inadequate volume of blood received in culture bottles   Culture      NO GROWTH < 24 HOURS Performed at [facility], [address] [place], [place] [address]    Report Status PENDING   Resp panel by RT-PCR  (RSV, Flu A&B, Covid) Anterior Nasal Swab     Status: None   Collection Time: 07/07/23  3:46 PM   Specimen: Anterior Nasal Swab  Result Value Ref Range   SARS Coronavirus 2 by RT PCR NEGATIVE NEGATIVE   Influenza A by PCR NEGATIVE NEGATIVE   Influenza B by PCR NEGATIVE NEGATIVE    Comment: (NOTE) The Xpert Xpress SARS-CoV-2/FLU/RSV plus assay is intended as an aid in the diagnosis of influenza from Nasopharyngeal swab specimens and should not be used as a sole basis for treatment. Nasal washings and aspirates are unacceptable for Xpert Xpress SARS-CoV-2/FLU/RSV testing.  Fact Sheet for Patients: bloggercourse.com  Fact Sheet for Healthcare Providers: seriousbroker.it  This test is not yet approved or cleared by the [place]  FDA and has been authorized for detection and/or diagnosis of SARS-CoV-2 by FDA under an Emergency Use Authorization (EUA). This EUA will remain in effect (meaning this test can be used) for the duration of the COVID-19 declaration under Section 564(b)(1) of the Act, 21 U.S.C. section 360bbb-3(b)(1), unless the authorization is terminated or revoked.     Resp Syncytial Virus by PCR NEGATIVE NEGATIVE    Comment: (NOTE) Fact Sheet for Patients: bloggercourse.com  Fact Sheet for Healthcare Providers: seriousbroker.it  This test  is not yet approved or cleared by the [place]  FDA and has been authorized for detection and/or diagnosis of SARS-CoV-2 by FDA under an Emergency Use Authorization (EUA). This EUA will remain in effect (meaning this test can be used) for the duration of the COVID-19 declaration under Section 564(b)(1) of the Act, 21 U.S.C. section 360bbb-3(b)(1), unless the authorization is terminated or revoked.  Performed at [facility], [address] [place], [place] [address]   I-Stat CG4 Lactic Acid     Status: Abnormal    Collection Time: 07/07/23  4:24 PM  Result Value Ref Range   Lactic Acid, Venous 2.8 (HH) 0.5 - 1.9 mmol/L   Comment NOTIFIED PHYSICIAN   I-Stat CG4 Lactic Acid     Status: Abnormal   Collection Time: 07/07/23  8:41 PM  Result Value Ref Range   Lactic Acid, Venous 5.1 (HH) 0.5 - 1.9 mmol/L   Comment NOTIFIED PHYSICIAN   Lactic acid, plasma     Status: None   Collection Time: 07/07/23 11:17 PM  Result Value Ref Range   Lactic Acid, Venous 1.5 0.5 - 1.9 mmol/L    Comment: Performed at [facility], [address], [place], [place] [address]  CBC with Differential/Platelet     Status: Abnormal   Collection Time: 07/08/23  5:27 AM  Result Value Ref Range   WBC 11.8 (H) 4.0 - 10.5 K/uL   RBC 2.08 (L) 4.22 - 5.81 MIL/uL   Hemoglobin 8.6 (L) 13.0 - 17.0 g/dL   HCT 73.6 (L) 60.9 - 47.9 %   MCV 126.4 (H) 80.0 - 100.0 fL   MCH 41.3 (H) 26.0 - 34.0 pg   MCHC 32.7 30.0 - 36.0 g/dL   RDW 84.1 (H) 88.4 - 84.4 %   Platelets 128 (L) 150 - 400 K/uL   nRBC 0.0 0.0 - 0.2 %   Neutrophils Relative % 83 %   Neutro Abs 9.8 (H) 1.7 - 7.7 K/uL   Lymphocytes Relative 7 %   Lymphs Abs 0.8 0.7 - 4.0 K/uL   Monocytes Relative 8 %   Monocytes Absolute 0.9 0.1 - 1.0 K/uL   Eosinophils Relative 1 %   Eosinophils Absolute 0.1 0.0 - 0.5 K/uL   Basophils Relative 0 %   Basophils Absolute 0.0 0.0 - 0.1 K/uL   Immature Granulocytes 1 %   Abs Immature Granulocytes 0.10 (H) 0.00 - 0.07 K/uL    Comment: Performed at [facility], [address] [place], [place] [address]  Comprehensive metabolic panel     Status: Abnormal   Collection Time: 07/08/23  5:27 AM  Result Value Ref Range   Sodium 131 (L) 135 - 145 mmol/L   Potassium 4.5 3.5 - 5.1 mmol/L   Chloride 93 (L) 98 - 111 mmol/L   CO2 24 22 - 32 mmol/L   Glucose, Bld 88 70 - 99 mg/dL    Comment: Glucose reference range applies only to samples taken after fasting for at least 8 hours.   BUN 38 (H) 8 - 23 mg/dL   Creatinine, Ser 3.57 (H) 0.61  - 1.24 mg/dL   Calcium  8.8 (L) 8.9 - 10.3 mg/dL   Total Protein 6.0 (L) 6.5 - 8.1 g/dL   Albumin  2.6 (L) 3.5 - 5.0 g/dL   AST 17 15 - 41 U/L   ALT 18 0 - 44 U/L   Alkaline Phosphatase 71 38 - 126 U/L   Total Bilirubin 0.7 0.0 - 1.2  mg/dL   GFR, Estimated 8 (L) >60 mL/min    Comment: (NOTE) Calculated using the CKD-EPI Creatinine Equation (2021)    Anion gap 14 5 - 15    Comment: Performed at [facility], [address], [place], [place] [address]  Magnesium      Status: None   Collection Time: 07/08/23  5:27 AM  Result Value Ref Range   Magnesium  1.9 1.7 - 2.4 mg/dL    Comment: Performed at [facility], [address] [place], [place] [address]  Phosphorus     Status: Abnormal   Collection Time: 07/08/23  5:27 AM  Result Value Ref Range   Phosphorus 6.2 (H) 2.5 - 4.6 mg/dL    Comment: Performed at [facility], [address], [place], [place] [address]  Vitamin B12     Status: Abnormal   Collection Time: 07/08/23  5:27 AM  Result Value Ref Range   Vitamin B-12 3,190 (H) 180 - 914 pg/mL    Comment: (NOTE) This assay is not validated for testing neonatal or myeloproliferative syndrome specimens for Vitamin B12 levels. Performed at [facility], [address], [place], [place] [address]   Folate     Status: None   Collection Time: 07/08/23  5:27 AM  Result Value Ref Range   Folate 12.9 >5.9 ng/mL    Comment: Performed at [facility], [address] [place], [place] [address]   CT ABDOMEN PELVIS W CONTRAST Result Date: 07/07/2023 CLINICAL DATA:  Groin abscess.  Dyspnea. EXAM: CT ABDOMEN AND PELVIS WITH CONTRAST TECHNIQUE: Multidetector CT imaging of the abdomen and pelvis was performed using the standard protocol following bolus administration of intravenous contrast. RADIATION DOSE REDUCTION: This exam was performed according to the departmental dose-optimization program which includes automated exposure control, adjustment of the mA and/or  kV according to patient size and/or use of iterative reconstruction technique. CONTRAST:  75mL OMNIPAQUE  IOHEXOL  350 MG/ML SOLN COMPARISON:  06/11/2023 CT abdomen/pelvis. FINDINGS: Lower chest: No acute abnormality at the lung bases. Hepatobiliary: Normal liver size. No liver mass. Cholelithiasis. No gallbladder distention. No definite gallbladder wall thickening. No pericholecystic fluid. No biliary ductal dilatation. Pancreas: Generalized pancreatic atrophy with no discrete mass or significant duct dilation, unchanged since baseline 11/17/2021 CT. Spleen: Normal size. No mass. Adrenals/Urinary Tract: Normal adrenals. Severe bilateral renal atrophy, unchanged. Simple 1.4 cm posterior upper right renal cyst and exophytic simple 1.5 cm interpolar left renal cyst with additional subcentimeter hypodense left renal cortical lesions that are too small to characterize, unchanged, for which no follow-up imaging is recommended. No hydronephrosis. Left percutaneous pigtail nephrostomy tube with tip in the lower left renal collecting system. Stable relatively collapsed bladder with irregular diffuse bladder wall thickening. Stomach/Bowel: Normal non-distended stomach. 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 No dilated or thick-walled small bowel loops. No pneumatosis. Chronic irregular thick walled presacral space 5.7 x 4.5 cm fluid collection (series 3/image 64), extending into the left ischio-anal fat, previously 4.6 x 4.0 cm on 06/11/2023 CT, slightly increased. Vascular/Lymphatic: Atherosclerotic nonaneurysmal abdominal aorta. Patent portal, splenic, hepatic and renal veins. No pathologically enlarged lymph nodes in the abdomen or pelvis. Reproductive: Stable mild prostatomegaly. 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 Other: No pneumoperitoneum. No ascites. Partially visualized surgical  grafts arising from the left common femoral artery and vein and extending inferiorly in the subcutaneous ventral left  thigh with stable small 1.9 x 1.6 cm fluid collection at the anterior origin of the surgical grafts (series 3/image 73). Musculoskeletal: No aggressive appearing focal osseous lesions. Moderate thoracolumbar spondylosis. 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 IMPRESSION: 1. Chronic irregular thick walled presacral space 5.7 x 4.5 cm fluid collection, extending into the left ischio-anal fat, slightly increased in size since 06/11/2023 CT. 2. Partially visualized surgical grafts arising from the left common femoral artery and vein and extending inferiorly in the subcutaneous ventral left thigh with stable small 1.9 x 1.6 cm fluid collection at the anterior origin of the surgical grafts. 3. 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 No evidence of acute small bowel complication. 4. Cholelithiasis. 5. Left percutaneous pigtail nephrostomy tube with tip in place in the lower left renal collecting system. No hydronephrosis. 6. Stable relatively collapsed bladder with irregular diffuse bladder wall thickening, nonspecific. Mild prostatomegaly. 7.  Aortic Atherosclerosis (ICD10-I70.0). Electronically Signed   By: Selinda DELENA Blue M.D.   On: 07/07/2023 18:25   DG Chest Portable 1 View Result Date: 07/07/2023 CLINICAL DATA:  Dyspnea EXAM: PORTABLE CHEST 1 VIEW COMPARISON:  04/10/2023 chest radiograph. FINDINGS: Vascular stent overlies the right subclavian region. Stable cardiomediastinal silhouette with normal heart size. No pneumothorax. No pleural effusion. No pulmonary edema. No acute consolidative airspace disease. Chronic streaky mild left lung base scarring versus atelectasis. IMPRESSION: No active disease. Chronic  streaky mild left lung base scarring versus atelectasis. Electronically Signed   By: Selinda DELENA Blue M.D.   On: 07/07/2023 18:12    Pending Labs Unresulted Labs (From admission, onward)    None       Vitals/Pain Today's Vitals   07/08/23 1115 07/08/23 1130 07/08/23 1145 07/08/23 1149  BP: 97/81 (!) 92/51    Pulse: (!) 122 (!) 58    Resp: (!) 26 18    Temp:   97.8 F (36.6 C)   TempSrc:   Oral   SpO2: (!) 74% 100%    Weight:      Height:      PainSc:   6  6     Isolation Precautions No active isolations  Medications Medications  acetaminophen  (TYLENOL ) tablet 650 mg (650 mg Oral Given 07/07/23 2220)    Or  acetaminophen  (TYLENOL ) suppository 650 mg ( Rectal See Alternative 07/07/23 2220)  melatonin tablet 3 mg (3 mg Oral Given 07/08/23 0247)  ondansetron  (ZOFRAN ) injection 4 mg (has no administration in time range)  linezolid  (ZYVOX ) IVPB 600 mg (0 mg Intravenous Stopped 07/08/23 1109)  midodrine  (PROAMATINE ) tablet 10 mg (10 mg Oral Given 07/08/23 1147)  apixaban  (ELIQUIS ) tablet 2.5 mg (2.5 mg Oral Given 07/08/23 1009)  sertraline  (ZOLOFT ) tablet 25 mg (has no administration in time range)  sevelamer  carbonate (RENVELA ) tablet 2,400 mg (2,400 mg Oral Given 07/08/23 1147)  mometasone -formoterol  (DULERA ) 200-5 MCG/ACT inhaler 2 puff (2 puffs Inhalation Given 07/08/23 0903)  albuterol  (PROVENTIL ) (2.5 MG/3ML) 0.083% nebulizer solution 2.5 mg (has no administration in time range)  piperacillin -tazobactam (ZOSYN ) IVPB 3.375 g (0 g Intravenous Stopped 07/08/23 1002)  traMADol  (ULTRAM ) tablet 50 mg (50 mg Oral Given 07/08/23 0829)  piperacillin -tazobactam (ZOSYN ) IVPB 3.375 g (0 g Intravenous Stopped 07/07/23 1555)  linezolid  (ZYVOX ) IVPB 600 mg (0 mg Intravenous Stopped 07/07/23 1911)  iohexol  (OMNIPAQUE ) 350 MG/ML injection 75 mL (75 mLs Intravenous Contrast Given  07/07/23 1637)  sodium chloride  0.9 % bolus 1,000 mL (0 mLs Intravenous Stopped 07/07/23 2223)    Mobility manual  wheelchair     Focused Assessments Cardiac Assessment Handoff:  Cardiac Rhythm: Atrial fibrillation No results found for: CKTOTAL, CKMB, CKMBINDEX, TROPONINI No results found for: DDIMER Does the Patient currently have chest pain? No   , Neuro Assessment Handoff:  Swallow screen pass? Yes  Cardiac Rhythm: Atrial fibrillation       Neuro Assessment: Within Defined Limits Neuro Checks:      Has TPA been given? No If patient is a Neuro Trauma and patient is going to OR before floor call report to 4N Charge nurse: [phone] or [phone]  , Renal Assessment Handoff:  Hemodialysis Schedule: Hemodialysis Schedule: Monday/Wednesday/Friday Last Hemodialysis date and time:    Restricted appendage: left leg   R Recommendations: See Admitting Provider Note  Report given to:   Additional Notes:  colostomy RLQ, iliostomy, dialysis access left leg

## 2023-07-08 NOTE — Plan of Care (Signed)
   Problem: Education: Goal: Knowledge of General Education information will improve Description Including pain rating scale, medication(s)/side effects and non-pharmacologic comfort measures Outcome: Progressing

## 2023-07-08 NOTE — Progress Notes (Signed)
 HOSPITALIST                ROUNDING                 NOTE Joshua Riley FMW:[id number]  DOB: May 18, 1936  DOA: 07/07/2023  PCP: Katrinka Garnette KIDD, MD  07/08/2023,6:51 AM   LOS: 1 day      Code Status: Full code From: Home  current Dispo: Likely home     88 year old ESRD MWF right femoral TDC-C/P hypotension on midodrine   Dialyzes via left thigh AVG placed [facility] 09/25/2021 advised by Dr. 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 R PCN exchange 06/25/2023  Seen in office Dr. Ann 12/30 for left groin abscess-history of having this for 3 weeks-treated 12/20 doxycycline  100 twice daily X 10 days-had severe pain-was given tramadol --- attempt at I&D on 12/31 patient was asked to be admitted by general surgery   Plan  Sepsis on admission 2/2 infected groin abscess Currently getting Zyvox  in addition to Zosyn -slight hypotension, baseline blood pressures however appear to be 80s systolic Hold further fluid resuscitation given ESRD state-per surgery-->wet-to-dry dressings no overt surgical plan--may require initiation of pain meds-currently on Tylenol --- resumed tramadol  50 every 8 as needed pain  Presacral space 5.7X 4.5 cm fluid collection extending into the left ischial anal fat slightly increased from 06/11/2023 May require IR input-unclear if this is related to the more proximal collection and will defer to general surgery planning and management  ESRD MWF via TDC complicated by hypotension Continues on midodrine  15 x 3 times daily meals--- not on any blood pressure meds PT Continue Renvela  2.4 3 times daily meals  A-fib CHADVASC >3 on Eliquis  Eliquis   initially held currently resumed 2.5 twice daily Not on any rate control keep on monitors overnight and reassess as needed  Depression continue Zoloft  25 daily  Obstructive uropathy with chronic left PCN Secondary to multiple strictures-no further workup at this time  Ulcerative colitis in total colectomy with ileostomy prior SBO 11/23/2021  CAD--no ischemic workup previously   DVT prophylaxis: Eliquis   Status is: Inpatient Remains inpatient appropriate because:   Requires further inpatient management      Subjective: Awake coherent no distress looks fair feels well Some pain and discomfort in groin No fever chest pain cough cold No shortness of breath  Objective + exam Vitals:   07/08/23 0320 07/08/23 0400 07/08/23 0445 07/08/23 0500  BP:  (!) 82/50 (!) 94/56 (!) 113/94  Pulse:  63 62 92  Resp:  18 (!) 26 (!) 28  Temp: 97.9 F (36.6 C)     TempSrc: Oral     SpO2:  100% 100% 100%  Weight:      Height:       Filed Weights   07/07/23 1536  Weight: 67.1 kg    Examination: EOMI NCAT no focal deficit no icterus no pallor Chest is clear no wheeze he does have a small area on his upper back that has burst small S1-S2 no murmur Chest is clear He has a left PCN which seems to be draining freely Abdomen is soft no rebound Left AV graft seems to be in place Wound exam as below   Data Reviewed: reviewed  CBC    Component Value Date/Time   WBC 11.8 (H) 07/08/2023 0527   RBC 2.08 (L) 07/08/2023 0527   HGB 8.6 (L) 07/08/2023 0527   HGB 10.9 (L) 04/10/2023 1541   HCT 26.3 (L) 07/08/2023 0527   HCT 33.2 (L) 04/10/2023 1541   PLT 128 (L) 07/08/2023 0527   PLT 207 04/10/2023 1541   MCV 126.4 (H) 07/08/2023 0527   MCV 119 (H) 04/10/2023 1541   MCH 41.3 (H) 07/08/2023 0527   MCHC 32.7 07/08/2023 0527   RDW 15.8 (H) 07/08/2023 0527   RDW 13.2 04/10/2023 1541   LYMPHSABS 0.8 07/08/2023 0527   LYMPHSABS 0.8 04/10/2023 1541   MONOABS 0.9 07/08/2023 0527   EOSABS  0.1 07/08/2023 0527   EOSABS 0.3 04/10/2023 1541   BASOSABS 0.0 07/08/2023 0527   BASOSABS 0.1 04/10/2023 1541      Latest Ref Rng & Units 07/08/2023    5:27 AM 07/07/2023    2:07 PM 04/10/2023    3:41 PM  CMP  Glucose 70 - 99 mg/dL 88  885  98   BUN 8 - 23 mg/dL 38  30  19   Creatinine 0.61 - 1.24 mg/dL 3.57  4.60  4.83   Sodium 135 - 145 mmol/L 131  135  138   Potassium 3.5 - 5.1 mmol/L 4.5  4.5  4.7   Chloride 98 - 111 mmol/L 93  94  93   CO2 22 - 32 mmol/L 24  27  29    Calcium  8.9 - 10.3 mg/dL 8.8  9.4  89.5   Total Protein 6.5 - 8.1 g/dL 6.0  7.1    Total Bilirubin 0.0 - 1.2 mg/dL 0.7  0.7    Alkaline Phos 38 - 126 U/L 71  85    AST 15 - 41 U/L 17  22    ALT 0 - 44 U/L 18  22      Scheduled Meds:  apixaban   2.5 mg Oral BID   midodrine   10 mg Oral TID WC   mometasone -formoterol   2 puff Inhalation BID   sertraline   25 mg Oral QHS   sevelamer  carbonate  2,400 mg Oral TID WC   Continuous Infusions:  linezolid  (ZYVOX ) IV Stopped (07/08/23 0358)   piperacillin -tazobactam (ZOSYN )  IV 3.375 g (07/08/23 0544)    Time 43  Colen Grimes, MD  [facility]

## 2023-07-09 DIAGNOSIS — L02214 Cutaneous abscess of groin: Secondary | ICD-10-CM | POA: Diagnosis not present

## 2023-07-09 LAB — URINALYSIS, W/ REFLEX TO CULTURE (INFECTION SUSPECTED): Squamous Epithelial / HPF: NONE SEEN /[HPF] (ref 0–5)

## 2023-07-09 LAB — CBC
HCT: 27.4 % — ABNORMAL LOW (ref 39.0–52.0)
Hemoglobin: 9.1 g/dL — ABNORMAL LOW (ref 13.0–17.0)
MCH: 41 pg — ABNORMAL HIGH (ref 26.0–34.0)
MCHC: 33.2 g/dL (ref 30.0–36.0)
MCV: 123.4 fL — ABNORMAL HIGH (ref 80.0–100.0)
Platelets: 140 10*3/uL — ABNORMAL LOW (ref 150–400)
RBC: 2.22 MIL/uL — ABNORMAL LOW (ref 4.22–5.81)
RDW: 15.6 % — ABNORMAL HIGH (ref 11.5–15.5)
WBC: 13 10*3/uL — ABNORMAL HIGH (ref 4.0–10.5)
nRBC: 0 % (ref 0.0–0.2)

## 2023-07-09 LAB — RENAL FUNCTION PANEL
Albumin: 2.7 g/dL — ABNORMAL LOW (ref 3.5–5.0)
Anion gap: 17 — ABNORMAL HIGH (ref 5–15)
BUN: 48 mg/dL — ABNORMAL HIGH (ref 8–23)
CO2: 23 mmol/L (ref 22–32)
Calcium: 8.7 mg/dL — ABNORMAL LOW (ref 8.9–10.3)
Chloride: 90 mmol/L — ABNORMAL LOW (ref 98–111)
Creatinine, Ser: 8.03 mg/dL — ABNORMAL HIGH (ref 0.61–1.24)
GFR, Estimated: 6 mL/min — ABNORMAL LOW (ref >60)
Glucose, Bld: 85 mg/dL (ref 70–99)
Phosphorus: 7.6 mg/dL — ABNORMAL HIGH (ref 2.5–4.6)
Potassium: 4.6 mmol/L (ref 3.5–5.1)
Sodium: 130 mmol/L — ABNORMAL LOW (ref 135–145)

## 2023-07-09 LAB — HEPATITIS B SURFACE ANTIGEN: Hepatitis B Surface Ag: NONREACTIVE

## 2023-07-09 LAB — GLUCOSE, CAPILLARY: Glucose-Capillary: 96 mg/dL (ref 70–99)

## 2023-07-09 MED ORDER — SODIUM CHLORIDE 0.9 % IV SOLN: INTRAVENOUS | Status: AC | PRN

## 2023-07-09 MED ORDER — CHLORHEXIDINE GLUCONATE CLOTH 2 % EX PADS
6.0000 | MEDICATED_PAD | Freq: Every day | CUTANEOUS | Status: DC
  Administered 2023-07-09 – 2023-07-16 (×8): 6 via TOPICAL

## 2023-07-09 NOTE — Consult Note (Signed)
 [facility] Renal Consultation Note    Indication for Consultation:  Management of ESRD/hemodialysis, anemia, hypertension/volume, and secondary hyperparathyroidism  HPI: Joshua Riley is a 88 y.o. male with PMH including ESRD on dialysis, a.fib, obstructive uropathy with chronic left nephrostomy, and UC who presented to the ED after being seen by outpt gen surgery for left groin access, reportedly present for 3 weeks and treated with PO antibiotics without much improvement in pain. He underwent I&D on 07/07/23. He was admitted for management of sepsis d/t abscess. Currently receiving antibiotics. Patient was also hypotensive on admission but reports this is his baseline. He is on midodrine  15mg  TID. Reports he sometimes gets light headed but denies any syncope recently. Patient also reports he has a breathing problem, reports he has had shortness of breath for months but none at present. Denies orthopnea and edema, reports dyspnea mainly occurs with exertion. CXR on admission showed possible atelectasis but no active pulmonary disease. Patient receives HD on MWF schedule but last dialysis was Tuesday due to holiday schedule. Per outside records his treatment time is 2 hr 45 min but he often shortens his treatments to only 2 hours. He has been leaving below his EDW, last weight 66.6kg.  Labs today notable for K+ 4.6, BUN 48, Cr 8.03, WBC 13.0, Hgb 9.1, Plt 140. Pt denies CP, dizziness, nausea, vomiting, diarrhea, fever, chills. Reports pain at the site of his abscess. He also notes his AVG tends to bleed for awhile post HD.   Past Medical History:  Diagnosis Date   A-fib (HCC)    Anemia    Arthritis    Cancer (HCC)    Basal cell   COVID-19    2021   Dyspnea    Dysrhythmia    Afib-controlled on eliquis    ESRD (end stage renal disease) (HCC) 10/22/2021   Glaucoma 11/18/2021   History of DVT (deep vein thrombosis)    Hydronephrosis    managed wtih a PCN   Idiopathic neuropathy  10/22/2021   lyrica     Ileostomy in place [facility])    Obstructive uropathy    With chronic left nephrostomy   Old retinal detachment, total or subtotal    Orthostatic hypotension 10/22/2021   Sleep apnea    does not need a machine   Stroke (HCC)    Ulcerative colitis (HCC)    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   Past Surgical History:  Procedure Laterality Date   BASAL CELL CARCINOMA EXCISION     10/23   COLON SURGERY     creation of j pouch     and subsequent takedown of j pouch   EYE SURGERY     IR NEPHROSTOMY EXCHANGE LEFT  12/10/2021   IR NEPHROSTOMY EXCHANGE LEFT  04/22/2022   IR NEPHROSTOMY EXCHANGE LEFT  07/29/2022   IR NEPHROSTOMY EXCHANGE LEFT  10/28/2022   IR NEPHROSTOMY EXCHANGE LEFT  02/05/2023   IR NEPHROSTOMY EXCHANGE LEFT  05/07/2023   IR NEPHROSTOMY EXCHANGE LEFT  06/25/2023   LEFT HEART CATH AND CORONARY ANGIOGRAPHY N/A 07/22/2022   Procedure: LEFT HEART CATH AND CORONARY ANGIOGRAPHY;  Surgeon: Jordan, Peter M, MD;  Location: [facility] INVASIVE CV LAB;  Service: Cardiovascular;  Laterality: N/A;   REVISION OF ARTERIOVENOUS GORETEX GRAFT Left 05/06/2022   Procedure: REDO LEFT THIGH ARTERIOVENOUS 4-7 MM GORETEX GRAFT;  Surgeon: Eliza Lonni RAMAN, MD;  Location: [facility] OR;  Service: Vascular;  Laterality: Left;   SMALL INTESTINE SURGERY  TOTAL COLECTOMY     Family History  Problem Relation Age of Onset   Stroke Mother    Cancer Father    Esophageal cancer Brother    Social History:  reports that he quit smoking about 40 years ago. His smoking use included cigarettes. He started smoking about 46 years ago. He has a 12 pack-year smoking history. He has never been exposed to tobacco smoke. He has never used smokeless tobacco. He reports current alcohol use of about 5.0 standard drinks of alcohol per week. He reports that he does not use drugs.  ROS: As per HPI otherwise negative.   Physical Exam: Vitals:   07/08/23 1954 07/09/23 0432 07/09/23  0733 07/09/23 0738  BP: (!) 92/53 (!) 93/48 (!) 103/53   Pulse: (!) 34  (!) 59   Resp: 20 17 20 20   Temp: (!) 97.5 F (36.4 C)  98.3 F (36.8 C)   TempSrc: Oral  Oral   SpO2: 100%  97%   Weight:      Height:         General: Well developed, well nourished, in no acute distress. Head: Normocephalic, atraumatic, sclera non-icteric, mucus membranes are moist. Lungs: Clear bilaterally to auscultation without wheezes, rales, or rhonchi. Breathing is unlabored on RA Heart: RRR with normal S1, S2. No murmurs, rubs, or gallops appreciated. Abdomen: Soft, non-distended, +BS. Colostomy present, L nephrostomy tube present Musculoskeletal:  Strength and tone appear normal for age. Lower extremities: No edema or ischemic changes, no open wounds. Neuro: Alert and oriented X 3. Moves all extremities spontaneously. Psych:  Responds to questions appropriately with a normal affect. Dialysis Access: Dressing over L groin area proximal to AVG  Allergies  Allergen Reactions   Cephalosporins Rash   Ciprofloxacin Itching and Rash   Baclofen Other (See Comments)    Altered mental status, after accidental overdose     Prior to Admission medications   Medication Sig Start Date End Date Taking? Authorizing Provider  acetaminophen  (TYLENOL ) 500 MG tablet Take 1,000 mg by mouth as needed for mild pain (pain score 1-3).   Yes [provider]  albuterol  (PROVENTIL ) (2.5 MG/3ML) 0.083% nebulizer solution Take 2.5 mg by nebulization 2 (two) times daily. 06/17/23  Yes [provider]  BIOTIN PO Take 1 capsule by mouth daily.   Yes [provider]  budesonide  (PULMICORT ) 0.5 MG/2ML nebulizer solution TAKE 2 ML (0.5 MG TOTAL) BY NEBULIZATION TWICE A DAY 06/29/23  Yes Hope Almarie ORN, NP  Cyanocobalamin  (B-12) 1000 MCG SUBL Place 1 tablet under the tongue daily at 6 (six) AM. 04/08/23  Yes Jesus Bernardino MATSU, MD  dorzolamide  (TRUSOPT ) 2 % ophthalmic solution Place 1 drop into both eyes 2  (two) times daily. 06/17/23  Yes [provider]  ELIQUIS  2.5 MG TABS tablet Take 1 tablet (2.5 mg total) by mouth 2 (two) times daily. 08/07/22  Yes Hobart Powell BRAVO, MD  latanoprost  (XALATAN ) 0.005 % ophthalmic solution Place 1 drop into the right eye at bedtime.   Yes [provider]  midodrine  (PROAMATINE ) 10 MG tablet Take 1 tablet (10 mg total) by mouth 3 (three) times daily. Takes 5 mg plus 10mg  for 15 mg total three times a day- started by nephrology in [place]  08/07/22  Yes Pemberton, Powell BRAVO, MD  midodrine  (PROAMATINE ) 5 MG tablet TAKE 1 TABLET 3 TIMES DAILY WITH MEALS. TAKES 5 MG PLUS 10MG  FOR 15 MG TOTAL THREE TIMES A DAY- STARTED BY NEPHROLOGY IN [place]  10/20/22  Yes  Katrinka Garnette KIDD, MD  Omega-3 Fatty Acids (OMEGA-3 FISH OIL) 500 MG CAPS Take 1 capsule by mouth daily.   Yes [provider]  ramelteon  (ROZEREM ) 8 MG tablet Take 1 tablet (8 mg total) by mouth at bedtime. 06/15/23  Yes Katrinka Garnette KIDD, MD  riboflavin (VITAMIN B-2) 100 MG TABS tablet Take 100 mg by mouth daily.   Yes [provider]  sertraline  (ZOLOFT ) 25 MG tablet Take 1 tablet (25 mg total) by mouth daily. Patient taking differently: Take 25 mg by mouth at bedtime. 04/08/23  Yes Jesus Bernardino MATSU, MD  sevelamer  carbonate (RENVELA ) 800 MG tablet Take 2,400 mg by mouth 3 (three) times daily with meals.   Yes [provider]  timolol  (TIMOPTIC ) 0.5 % ophthalmic solution Place 1 drop into both eyes daily.   Yes [provider]  traMADol  (ULTRAM ) 50 MG tablet Take 1 tablet (50 mg total) by mouth every 8 (eight) hours as needed (prn pain). 06/26/23  Yes Hudnell, Corean, NP  WIXELA INHUB 250-50 MCG/ACT AEPB Inhale 1 puff into the lungs 2 (two) times daily. 07/04/23  Yes [provider]   Current Facility-Administered Medications  Medication Dose Route Frequency Provider Last Rate Last Admin   acetaminophen  (TYLENOL ) tablet 650 mg  650 mg Oral Q6H PRN Howerter,  Justin B, DO   650 mg at 07/08/23 1505   Or   acetaminophen  (TYLENOL ) suppository 650 mg  650 mg Rectal Q6H PRN Howerter, Justin B, DO       albuterol  (PROVENTIL ) (2.5 MG/3ML) 0.083% nebulizer solution 2.5 mg  2.5 mg Nebulization Q4H PRN Howerter, Justin B, DO       apixaban  (ELIQUIS ) tablet 2.5 mg  2.5 mg Oral BID Howerter, Justin B, DO   2.5 mg at 07/08/23 2147   linezolid  (ZYVOX ) IVPB 600 mg  600 mg Intravenous Q12H Howerter, Justin B, DO 300 mL/hr at 07/08/23 2248 600 mg at 07/08/23 2248   melatonin tablet 3 mg  3 mg Oral QHS PRN Howerter, Justin B, DO   3 mg at 07/08/23 0247   midodrine  (PROAMATINE ) tablet 10 mg  10 mg Oral TID WC Howerter, Justin B, DO   10 mg at 07/08/23 1649   mometasone -formoterol  (DULERA ) 200-5 MCG/ACT inhaler 2 puff  2 puff Inhalation BID Howerter, Justin B, DO   2 puff at 07/09/23 9261   ondansetron  (ZOFRAN ) injection 4 mg  4 mg Intravenous Q6H PRN Howerter, Justin B, DO       Oral care mouth rinse  15 mL Mouth Rinse PRN Samtani, Jai-Gurmukh, MD       piperacillin -tazobactam (ZOSYN ) IVPB 2.25 g  2.25 g Intravenous Q8H Lawton Harlene DEL, RPH 100 mL/hr at 07/09/23 0608 2.25 g at 07/09/23 9391   sertraline  (ZOLOFT ) tablet 25 mg  25 mg Oral QHS Samtani, Jai-Gurmukh, MD       sevelamer  carbonate (RENVELA ) tablet 2,400 mg  2,400 mg Oral TID WC Howerter, Justin B, DO   2,400 mg at 07/08/23 1649   traMADol  (ULTRAM ) tablet 50 mg  50 mg Oral Q8H PRN Samtani, Jai-Gurmukh, MD   50 mg at 07/08/23 1649   Labs: Basic Metabolic Panel: Recent Labs  Lab 07/07/23 1407 07/08/23 0527 07/09/23 0314  NA 135 131* 130*  K 4.5 4.5 4.6  CL 94* 93* 90*  CO2 27 24 23   GLUCOSE 114* 88 85  BUN 30* 38* 48*  CREATININE 5.39* 6.42* 8.03*  CALCIUM  9.4 8.8* 8.7*  PHOS  --  6.2*  7.6*   Liver Function Tests: Recent Labs  Lab 07/07/23 1407 07/08/23 0527 07/09/23 0314  AST 22 17  --   ALT 22 18  --   ALKPHOS 85 71  --   BILITOT 0.7 0.7  --   PROT 7.1 6.0*  --   ALBUMIN  3.0* 2.6* 2.7*    No results for input(s): LIPASE, AMYLASE in the last 168 hours. No results for input(s): AMMONIA in the last 168 hours. CBC: Recent Labs  Lab 07/07/23 1407 07/08/23 0527 07/09/23 0314  WBC 15.8* 11.8* 13.0*  NEUTROABS 14.1* 9.8*  --   HGB 10.0* 8.6* 9.1*  HCT 30.8* 26.3* 27.4*  MCV 126.7* 126.4* 123.4*  PLT 149* 128* 140*   Cardiac Enzymes: No results for input(s): CKTOTAL, CKMB, CKMBINDEX, TROPONINI in the last 168 hours. CBG: No results for input(s): GLUCAP in the last 168 hours. Iron Studies: No results for input(s): IRON, TIBC, TRANSFERRIN, FERRITIN in the last 72 hours. Studies/Results: CT ABDOMEN PELVIS W CONTRAST Result Date: 07/07/2023 CLINICAL DATA:  Groin abscess.  Dyspnea. EXAM: CT ABDOMEN AND PELVIS WITH CONTRAST TECHNIQUE: Multidetector CT imaging of the abdomen and pelvis was performed using the standard protocol following bolus administration of intravenous contrast. RADIATION DOSE REDUCTION: This exam was performed according to the departmental dose-optimization program which includes automated exposure control, adjustment of the mA and/or kV according to patient size and/or use of iterative reconstruction technique. CONTRAST:  75mL OMNIPAQUE  IOHEXOL  350 MG/ML SOLN COMPARISON:  06/11/2023 CT abdomen/pelvis. FINDINGS: Lower chest: No acute abnormality at the lung bases. Hepatobiliary: Normal liver size. No liver mass. Cholelithiasis. No gallbladder distention. No definite gallbladder wall thickening. No pericholecystic fluid. No biliary ductal dilatation. Pancreas: Generalized pancreatic atrophy with no discrete mass or significant duct dilation, unchanged since baseline 11/17/2021 CT. Spleen: Normal size. No mass. Adrenals/Urinary Tract: Normal adrenals. Severe bilateral renal atrophy, unchanged. Simple 1.4 cm posterior upper right renal cyst and exophytic simple 1.5 cm interpolar left renal cyst with additional subcentimeter hypodense left renal  cortical lesions that are too small to characterize, unchanged, for which no follow-up imaging is recommended. No hydronephrosis. Left percutaneous pigtail nephrostomy tube with tip in the lower left renal collecting system. Stable relatively collapsed bladder with irregular diffuse bladder wall thickening. Stomach/Bowel: Normal non-distended stomach. 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 No dilated or thick-walled small bowel loops. No pneumatosis. Chronic irregular thick walled presacral space 5.7 x 4.5 cm fluid collection (series 3/image 64), extending into the left ischio-anal fat, previously 4.6 x 4.0 cm on 06/11/2023 CT, slightly increased. Vascular/Lymphatic: Atherosclerotic nonaneurysmal abdominal aorta. Patent portal, splenic, hepatic and renal veins. No pathologically enlarged lymph nodes in the abdomen or pelvis. Reproductive: Stable mild prostatomegaly. 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 Other: No pneumoperitoneum. No ascites. Partially visualized surgical grafts arising from the left common femoral artery and vein and extending inferiorly in the subcutaneous ventral left thigh with stable small 1.9 x 1.6 cm fluid collection at the anterior origin of the surgical grafts (series 3/image 73). Musculoskeletal: No aggressive appearing focal osseous lesions. Moderate thoracolumbar spondylosis. 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 IMPRESSION: 1. Chronic irregular thick walled presacral space 5.7 x 4.5 cm fluid collection, extending into the left ischio-anal fat, slightly increased in size since 06/11/2023 CT. 2. Partially visualized surgical grafts arising from the left common femoral artery and  vein and extending inferiorly in the subcutaneous ventral left thigh with stable small 1.9 x 1.6 cm fluid  collection at the anterior origin of the surgical grafts. 3. 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 No evidence of acute small bowel complication. 4. Cholelithiasis. 5. Left percutaneous pigtail nephrostomy tube with tip in place in the lower left renal collecting system. No hydronephrosis. 6. Stable relatively collapsed bladder with irregular diffuse bladder wall thickening, nonspecific. Mild prostatomegaly. 7.  Aortic Atherosclerosis (ICD10-I70.0). Electronically Signed   By: Selinda DELENA Blue M.D.   On: 07/07/2023 18:25   DG Chest Portable 1 View Result Date: 07/07/2023 CLINICAL DATA:  Dyspnea EXAM: PORTABLE CHEST 1 VIEW COMPARISON:  04/10/2023 chest radiograph. FINDINGS: Vascular stent overlies the right subclavian region. Stable cardiomediastinal silhouette with normal heart size. No pneumothorax. No pleural effusion. No pulmonary edema. No acute consolidative airspace disease. Chronic streaky mild left lung base scarring versus atelectasis. IMPRESSION: No active disease. Chronic streaky mild left lung base scarring versus atelectasis. Electronically Signed   By: Selinda DELENA Blue M.D.   On: 07/07/2023 18:12    Dialysis Orders: Center: [facility]  on MWF . 180NRe 2 hr 45 min BFR 350 DFR Auto 1.5 EDW 68.5kg 2K 2.5Ca AVG 15g Heparin  5000 unit bolus Mircera 120mcg IV q 4 weeks- last dose 06/24/23 Calcitriol  0.25 mcg PO q HD  Assessment/Plan:  Sepsis d/t groin abscess: S/p I&D, on antibiotics per PMD/general surgery team  ESRD:  On MWF schedule, last HD was Tuesday due to the holiday. Will plan for next HD tomorrow. Patient notes AVG has had prolonged bleeding post HD lately so will hold heparin  for now.  Hypertension/volume: BP soft/stable, on midodrine . Has been leaving below his EDW, possible there is a component of fluid overload contributing to his dyspnea. UFG 1.5L as tolerated, does  not tolerate high UF due to hypotension  Anemia: Hgb 9.1, likely needs more frequent ESA dosing, will order aranesp   Metabolic bone disease: Calcium  controlled, phos elevated. Resume VDRA and home binders  Nutrition:  Alb 2.7, will need renal diet/protein supplements Chronic obstructive uropathy: With chronic left nephrostomy tube, reportedly some concern for infection but already on abx as abovehronic obstructive uropathy  Lucie Collet, PA-C 07/09/2023, 8:55 AM  [facility] Pager: [phone]

## 2023-07-09 NOTE — Progress Notes (Signed)
 Referring Provider(s): Samtani  Supervising Physician: Jennefer Rover  Patient Status:  [facility] - In-pt  Chief Complaint:  Purulent urine in bag  Brief History:  Joshua Riley is an 88 year old man who is well known to our service for chronic indwelling left percutaneous nephrostomy tube.  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  He was admitted on 07/07/23 secondary to sepsis from a groin abscess.  We are asked to evaluate the PCN as the urine appears turbid.   Subjective:  Patient lying in bed. C/o IVF burning his hand (RN called to eval)  Allergies: Cephalosporins, Ciprofloxacin, and Baclofen  Medications: Prior to Admission medications   Medication Sig Start Date End Date Taking? Authorizing Provider  acetaminophen  (TYLENOL ) 500 MG tablet Take 1,000 mg by mouth as needed for mild pain (pain score 1-3).   Yes [provider]  albuterol  (PROVENTIL ) (2.5 MG/3ML) 0.083% nebulizer solution Take 2.5 mg by nebulization 2 (two) times daily. 06/17/23  Yes [provider]  BIOTIN PO Take 1 capsule by mouth daily.   Yes [provider]  budesonide  (PULMICORT ) 0.5 MG/2ML nebulizer solution TAKE 2 ML (0.5 MG TOTAL) BY NEBULIZATION TWICE A DAY 06/29/23  Yes Hope Almarie ORN, NP  Cyanocobalamin  (B-12) 1000 MCG SUBL Place 1 tablet under the tongue daily at 6 (six) AM. 04/08/23  Yes Jesus Bernardino MATSU, MD  dorzolamide  (TRUSOPT ) 2 % ophthalmic solution Place 1 drop into both eyes 2 (two) times daily. 06/17/23  Yes [provider]  ELIQUIS  2.5 MG TABS tablet Take 1 tablet (2.5 mg total) by mouth 2 (two) times daily. 08/07/22  Yes Hobart Powell BRAVO, MD  latanoprost  (XALATAN ) 0.005 % ophthalmic solution Place 1 drop into the right eye at bedtime.   Yes [provider]  midodrine  (PROAMATINE ) 10 MG tablet Take 1 tablet (10 mg total) by mouth 3 (three) times daily. Takes 5 mg plus 10mg  for 15 mg total three times a day- started by  nephrology in [place]  08/07/22  Yes Pemberton, Powell BRAVO, MD  midodrine  (PROAMATINE ) 5 MG tablet TAKE 1 TABLET 3 TIMES DAILY WITH MEALS. TAKES 5 MG PLUS 10MG  FOR 15 MG TOTAL THREE TIMES A DAY- STARTED BY NEPHROLOGY IN [place]  10/20/22  Yes Katrinka Garnette KIDD, MD  Omega-3 Fatty Acids (OMEGA-3 FISH OIL) 500 MG CAPS Take 1 capsule by mouth daily.   Yes [provider]  riboflavin (VITAMIN B-2) 100 MG TABS tablet Take 100 mg by mouth daily.   Yes [provider]  sertraline  (ZOLOFT ) 25 MG tablet Take 1 tablet (25 mg total) by mouth daily. Patient taking differently: Take 25 mg by mouth at bedtime. 04/08/23  Yes Jesus Bernardino MATSU, MD  sevelamer  carbonate (RENVELA ) 800 MG tablet Take 2,400 mg by mouth 3 (three) times daily with meals.   Yes [provider]  timolol  (TIMOPTIC ) 0.5 % ophthalmic solution Place 1 drop into both eyes daily.   Yes [provider]  traMADol  (ULTRAM ) 50 MG tablet Take 1 tablet (50 mg total) by mouth every 8 (eight) hours as needed (prn pain). 06/26/23  Yes Hudnell, Corean, NP  WIXELA INHUB 250-50 MCG/ACT AEPB Inhale 1 puff into the lungs 2 (two) times daily. 07/04/23  Yes [provider]  ramelteon  (ROZEREM ) 8 MG tablet TAKE 1 TABLET BY MOUTH AT BEDTIME. 07/09/23   Katrinka Garnette KIDD, MD     Vital Signs: BP (!) 87/56 (BP Location: Left Arm)   Pulse 61   Temp 98.1  F (36.7 C) (Oral)   Resp 18   Ht 5' 5 (1.651 m)   Wt 148 lb (67.1 kg)   SpO2 94%   BMI 24.63 kg/m   Physical Exam Vitals reviewed.  Constitutional:      Appearance: Normal appearance.  Cardiovascular:     Rate and Rhythm: Normal rate.  Pulmonary:     Effort: Pulmonary effort is normal. No respiratory distress.  Neurological:     Mental Status: He is alert.   Nephrostomy in good position on exam. No visible drainage. Chronic inflammatory erythema of the skin, but no signs of infection.    Urine in gravity bag is purulent appearing c/w UTI.     Labs:  CBC: Recent Labs    04/10/23 1541 07/07/23 1407 07/08/23 0527 07/09/23 0314  WBC 11.8* 15.8* 11.8* 13.0*  HGB 10.9* 10.0* 8.6* 9.1*  HCT 33.2* 30.8* 26.3* 27.4*  PLT 207 149* 128* 140*    COAGS: No results for input(s): INR, APTT in the last 8760 hours.  BMP: Recent Labs    04/10/23 1541 07/07/23 1407 07/08/23 0527 07/09/23 0314  NA 138 135 131* 130*  K 4.7 4.5 4.5 4.6  CL 93* 94* 93* 90*  CO2 29 27 24 23   GLUCOSE 98 114* 88 85  BUN 19 30* 38* 48*  CALCIUM  10.4* 9.4 8.8* 8.7*  CREATININE 5.16* 5.39* 6.42* 8.03*  GFRNONAA  --  10* 8* 6*    LIVER FUNCTION TESTS: Recent Labs    03/27/23 1430 04/08/23 1503 07/07/23 1407 07/08/23 0527 07/09/23 0314  BILITOT 0.5 0.5 0.7 0.7  --   AST 40* 23 22 17   --   ALT 32 16 22 18   --   ALKPHOS 78 80 85 71  --   PROT 7.2 7.3 7.1 6.0*  --   ALBUMIN  3.9 4.2 3.0* 2.6* 2.7*    Assessment and Plan:  Patient with chornic indwelling left PCN - most recent exchange was just 2 weeks ago on 06/25/23.  Urine appearance c/w UTI.  Discussed with Dr. Jennefer.  Recommend antibiotics for UTI.  No need for PCN exchange at this time.  Electronically Signed: SARI GORMAN LAMP, PA-C 07/09/2023, 3:05 PM    I spent a total of 15 Minutes at the the patient's bedside AND on the patient's hospital floor or unit, greater than 50% of which was counseling/coordinating care for evaluation of PCN.

## 2023-07-09 NOTE — Telephone Encounter (Signed)
 Pharmacy will need DX code on the prescription. It needs to be processed through Medicare Part B

## 2023-07-09 NOTE — Consult Note (Signed)
 WOC Nurse ostomy consult note Stoma type/location: ileostomy RLQ  Stomal assessment/size: ostomy with protrusion 1' Peristomal assessment: sensitive skin on 3 o`clock, there is a fold peri-ostomy, where is leaking Treatment options for stomal/peristomal skin:  Output  Soft convex lawson #[id number], barrier ring [id number], change 2Q Ostomy pouching: 1pc. Education provided: Old ostomy. Teach how to open and close the pouch, because the pt uses a clamp system at home.  WOC Nurse Consult Note: Reason for Consult: Requested to assess buttock and abscess. Wound type: Abscess on the groin, draining close to the anus. Open wound after surgery on the L groin. Measurement: 4x3x.3cm Wound bed: 80% red, 20% yellow. Drainage (amount, consistency, odor) draining pus Periwound: red, edema, swollen. Dressing procedure/placement/frequency: Apply gauze on the surgical site, cover with pad to absorb the exsudate. Change daily.    WOC team will not plan to follow further.  Please reconsult if further assistance is needed. Thank-you,  Lela Holm BSN, RN, [employer], WOC  (Pager: [phone])

## 2023-07-09 NOTE — Telephone Encounter (Signed)
 Spoke with patient's daughter Bradly Chris who is listed on DPR. Went over plan per last phone note with Beth. Patient is currently in hospital but daughter says his breathing has gotten worse. HFU has been scheduled 08/2023.

## 2023-07-09 NOTE — Consult Note (Signed)
 Date of Admission:  07/07/2023          Reason for Consult: Groin abscess and presacral fluid collection that waws treated as an abscess at [facility] after bloody aspirate there had yielded ESBL E coli (S only to carbapenems, zosyn  and gentamicin and Strep intermedius) which previously communicated with skin via fistula and has had drain placed and been drained again (see below)     Referring Provider: Reggie Grimes, MD   Assessment:  Groin abscess sp I and D Presacral abscess that has grown since it was discovered at [facility] has previously yielded ESBL E coli and Strep intermedius on cultures on IR guided aspirate Urine from PCN with large amount of WBC ESRD on HD Hx of ulcerative colitis  Blood from rectum esp when he stands up that his daughter speculates may be due to the fluid collection--he is also on anticoagulation PAF on eliquis   Plan:  DC zosyn  for now Switch zyvox  to po Need [facility] to keep us  dated on cultures from the office Followup IR guided aspirate which should be sent for culture Followup urine culture--though I have doubts about this representing an infection  DOes he need PCN now that he is on HD? Re the blood per rectum could an anoscope give sufficient visualization here. GI had refused to see him withouth 3 years of records from prior GI apparently We will plan on switching his care in terms of ID management and drain management to [place] for ease for pt and daughter  I will make him a followup with us  in our clinic  I am on this weekend and will keep an eye on his cultures and how he is doing and will definitely see him again on Monday though I may also drop in to see him and his daughter this weekend.   Principal Problem:   Abscess of groin, left Active Problems:   End-stage renal disease on hemodialysis (HCC)   Severe sepsis (HCC)   Depression   Leukocytosis   Paroxysmal atrial fibrillation (HCC)   History of COPD   Chronic diastolic CHF  (congestive heart failure) (HCC)   History of anemia due to chronic kidney disease   Scheduled Meds:  apixaban   2.5 mg Oral BID   Chlorhexidine  Gluconate Cloth  6 each Topical Q0600   midodrine   10 mg Oral TID WC   mometasone -formoterol   2 puff Inhalation BID   sertraline   25 mg Oral QHS   sevelamer  carbonate  2,400 mg Oral TID WC   Continuous Infusions:  sodium chloride  10 mL/hr at 07/09/23 2100   linezolid  (ZYVOX ) IV 600 mg (07/09/23 2102)   piperacillin -tazobactam (ZOSYN )  IV 2.25 g (07/09/23 2238)   PRN Meds:.sodium chloride , acetaminophen  **OR** acetaminophen , albuterol , melatonin, ondansetron  (ZOFRAN ) IV, mouth rinse, traMADol   HPI: Joshua Riley is a 88 y.o. 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 He also underwent bilateral PCN.  This fall in August of 2024 he wsa found to have a 4.2 presaccral abscess with fistulous tract extending into the perineum.   In May of 2024 he had MRI of pelvis which showed  12/04/22 - 12/04/22 - MRI Pelvis  1. Large, incompletely imaged rim enhancing presacral air and fluid collection within the anorectal resection bed following total colectomy, which measures at least  11.3 x 3.8 x 3.1 cm. This communicates to the skin at the left aspect of the gluteal cleft. Presumed postoperative abscess, however note that the presence or absence of infection within this fluid is not established by imaging. 2. Severely thickened, lobulated appearing bladder, incompletely imaged. Partially imaged right hydroureter. Consider additional imaging of the abdomen to further evaluate hydroureter and possible hydronephrosis if clinically appropriate in this dialysis patient   When seen by [facility] in late July was c/o months of drainage and had macerated skin near sinus  with prob only 1cm.  GI at that time opined that ddx includes retained rectal mucosa, sinus tract/infection never cleared from index surgery, ?urinoma in context of prior bladder repair/ureteral injuries. Recommended IR drainage followed by CT fistulogram when output slows. On 02/05/2023 he underwent PCN exchange at [facility]  On 02/24/2023 CT abdomen and pelvis showed  Presacral 4.2 cm abscess with associated small left ischioanal canal fistulous tract extending into the perineum.  Similar to slightly increased ill defined hypoattenuation with mild surrounding enhancement in the hepatic hilum   On August 22nd IR placed a drain with bloody fluid which was cultured and grew an ESBL E coli and streptococcus intermedius.   9/12 - Repeat CT: Decreased size of the presacral abscess collection (2.6 x 2.2 cm with interval resolution or decrease of the fistulous tract to the perineum.   [facility] (Dr.Cunningham and Dr. Juanita) began seeing himhim  on 03/20/2023 and arranged for him to receive Invanz  59m after [facility]  ON 04/09/2023 CT scan actually showed enlargement of the abscess having increased in size to 3.4 x 2.2 x 5.9  f with Persistent  inflammation along the prior right transgluteal drain tract. A fistulous  connection to the perineum is slightly more pronounced than on prior.   ID MDS reached out to Colorectal surgeon at [facility] who discussed with them that that there was no longer an anastomosis as he had undergone IPAA excision and the remaining small bowel was not near the fluid collection in the pelvis. He further felt given age and multiple surgeries there would be no surgical options for the patient.   Dr, Stacia also felt that ongoing Ertapenem  absent further interventions would not be helpful. Therefore it was DC on 04/10/2023.   On 04/21/23 attempts were made by [facility] to place drain but area was too  small to place drain and area was aspirated instead. Cultures yielded no growth.  Another CT abdomen and pelvis was performed in December 5th by PCP due to the patient suffering from abdominal pain.  This showed a new fluid collection of 4.6 x 4.0 cm in cm in pelvic/presacral space. NOTE THIS FILM WAS COMPARED TO SCAN ON 02/04/2022 AT [facility] AND WITHOUT KNOWLEDGE OF EVERYTHING I DESCRIBED ABOVE.I was also not formally read until 06/17/2024.   His PCP wanted the patient to go back to [facility] to discusss the findings.   It appears from Care Everywhere That [facility] were endeavoring to have pelvic fluid collection aspirated.  In the interim the patient developed a painful boil in his groin.  He was now also suffering from low blood pressures that were worse than usual. Patient had tried topical agents without success and was rx doxycyline.  Patient was referrred to Surgery. The doxycyline in the interim did not help and he was seen by Dr. Ann at [facility] office  who performed bedside I and D with cultures--though they are not available to me.[facility] felt he would be best served for admission for IV antibiotics.  Dr. Sebastian with surgery. He also noted the presacral fluid collection/abscess that had been drained in the past.  Blood cultures were taken on admission that were without growth. He was placed on zyvox  and zosyn .  CT abdomen and pelvis showed a chronic and irregular THICK walled area in the pre-sacral space that is now 5.7x/45cm extending into the left ischio-anal fat which is a slight increase vs earlier scan done at [facility] but again continuing to enlarge vs when imaged at [facility].  In the interim IR have seen him but NOT for his presacral fluid collection but for cloudy urine in his PCN bag. The fact that this might be cloudy is in itself NOT sufficient to make a diagnosis of UTI. UA did have a lot of WBC. It is not obvious that he has symptoms. Today urine looks better vs what was  described yesterday.  It is not unsual for pts on HD who make urine to have turbid urine though his daughter states it has changed in recent days vs a month ago.  I was consulted re the groin abscess that per [facility] does not require further drainage but more worrisome to me as a loose end was the presacral fluid collection that now upon lengthy review of [facility] records is concerning for infection and which in the past grew an ESBL that was only S to carbapenems.  Dr. Stevie has spoken with the patient's surgeon at [facility] Dr. Leigh and plan currently is for interventional radiology placed drain with cultures that can be sent.  Cultures may be unrevealing given that he has been on broad-spectrum antibiotics.  I suspect that he may have some type of anastomotic leak or something else that is causing fluid to accumulate in his abdomen.  Dr. Leigh at [facility] is already reiterated that he is not a surgical candidate for heroic surgery along these lines.  I suspect that this fluid collection will continue to recur as it has been and that he will require work repeated aspirates for drainage and issues of antibiotics when he appears acutely infected from this area.  Talking to his daughter she describes blood per rectum that happens when the patient stands up and she is wondering if the fluid collection is actually draining through his remnant rectal tissue.  He had been sent to gastroenterology for referral but would wonder if potentially if he needs this area visualize whether an anoscope could be used from general surgery?  With regards to his groin I think we can narrow him to Zyvox  which will provide excellent coverage for MRSA and strep species which be the most likely culprits here so we will stop his Zosyn .  Would ask [facility] to keep us  updated on cultures from their office. Last night Dr. Sebastian had told me that they had nothing back yet.  He has a bit of intertrigo as well so we will initiate some  fluconazole  once we have IR guided aspirate.  In terms of long term plan he and his daughter happy to follow-up with infectious disease and interventional radiology in [place] and not have to take the trip to [place].  In the surgeons at [facility] was largely driven by need to see a surgical expert who could potentially provide a surgical intervention.  Since surgery is not on the table anymore I think it is more  reasonable to have him followed here in [place]   I have personally spent 120 minutes involved in face-to-face and non-face-to-face activities for this patient on the day of the visit. Professional time spent includes the following activities: Preparing to see the patient (review of tests, records from [facility]), Obtaining and/or reviewing separately obtained history (admission/discharge record), Performing a medically appropriate examination and/or evaluation , Ordering medications/tests/procedures, referring and communicating with other health care professionals, including Dr. Sebastian, Kinsinger, Samtani. Documenting clinical information in the EMR, Independently interpreting results (not separately reported), Communicating results to the patient and his daughter. Counseling and educating the patient/family/caregiver and Care coordination (not separately reported).         Review of Systems: Review of Systems  Constitutional:  Negative for chills, diaphoresis, fever, malaise/fatigue and weight loss.  HENT:  Negative for congestion, hearing loss, sore throat and tinnitus.   Eyes:  Negative for blurred vision and double vision.  Respiratory:  Negative for cough, sputum production, shortness of breath and wheezing.   Cardiovascular:  Negative for chest pain, palpitations and leg swelling.  Gastrointestinal:  Negative for abdominal pain, blood in stool, constipation, diarrhea, heartburn, melena, nausea and vomiting.  Genitourinary:  Negative for dysuria, flank pain and hematuria.   Musculoskeletal:  Negative for back pain, falls, joint pain and myalgias.  Skin:  Negative for itching and rash.  Neurological:  Negative for dizziness, sensory change, focal weakness, loss of consciousness, weakness and headaches.  Endo/Heme/Allergies:  Does not bruise/bleed easily.  Psychiatric/Behavioral:  Negative for depression, memory loss and suicidal ideas. The patient is not nervous/anxious.     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     Ileostomy in place [facility])    Obstructive uropathy    With chronic left nephrostomy   Old retinal detachment, total or subtotal    Orthostatic hypotension 10/22/2021   Sleep apnea    does not need a machine   Stroke (HCC)    Ulcerative colitis (HCC)    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    Social History   Tobacco Use   Smoking status: Former    Current packs/day: 0.00    Average packs/day: 2.0 packs/day for 6.0 years (12.0 ttl pk-yrs)    Types: Cigarettes    Start date: 18    Quit date: 53    Years since quitting: 40.0    Passive exposure: Never   Smokeless tobacco: Never  Vaping Use   Vaping status: Never Used  Substance Use Topics   Alcohol use: Yes    Alcohol/week: 5.0 standard drinks of alcohol    Types: 5 Shots of liquor per week    Comment: socially   Drug use: Never    Family History  Problem Relation Age of Onset   Stroke Mother    Cancer Father    Esophageal cancer Brother    Allergies  Allergen Reactions   Cephalosporins Rash   Ciprofloxacin Itching and Rash   Baclofen Other (See Comments)    Altered  mental status, after accidental  overdose      OBJECTIVE: Blood pressure (!) 104/49, pulse 74, temperature (!) 97.3 F (36.3 C), temperature source Oral, resp. rate 20, height 5' 5 (1.651 m), weight 67.1 kg, SpO2 93%.  Physical Exam Exam conducted with a chaperone present.  Constitutional:      Appearance: He is well-developed.  HENT:     Head: Normocephalic and atraumatic.  Eyes:     Conjunctiva/sclera: Conjunctivae normal.  Cardiovascular:     Rate and Rhythm: Normal rate and regular rhythm.  Pulmonary:     Effort: Pulmonary effort is normal. No respiratory distress.     Breath sounds: No wheezing.  Abdominal:     General: There is no distension.     Palpations: Abdomen is soft.     Comments: Ostomy clean  Musculoskeletal:        General: No tenderness. Normal range of motion.     Cervical back: Normal range of motion and neck supple.  Skin:    General: Skin is warm and dry.     Coloration: Skin is not pale.     Findings: No erythema or rash.  Neurological:     General: No focal deficit present.     Mental Status: He is alert and oriented to person, place, and time.  Psychiatric:        Mood and Affect: Mood normal.        Behavior: Behavior normal.        Thought Content: Thought content normal.        Judgment: Judgment normal.    Groin 07/10/2023:    Back :    Lab Results Lab Results  Component Value Date   WBC 13.0 (H) 07/09/2023   HGB 9.1 (L) 07/09/2023   HCT 27.4 (L) 07/09/2023   MCV 123.4 (H) 07/09/2023   PLT 140 (L) 07/09/2023    Lab Results  Component Value Date   CREATININE 8.03 (H) 07/09/2023   BUN 48 (H) 07/09/2023   NA 130 (L) 07/09/2023   K 4.6 07/09/2023   CL 90 (L) 07/09/2023   CO2 23 07/09/2023    Lab Results  Component Value Date   ALT 18 07/08/2023   AST 17 07/08/2023   ALKPHOS 71 07/08/2023   BILITOT 0.7 07/08/2023     Microbiology: Recent Results (from the past 240 hours)  Blood culture (routine x 2)     Status: None (Preliminary result)    Collection Time: 07/07/23  2:07 PM   Specimen: BLOOD  Result Value Ref Range Status   Specimen Description BLOOD RIGHT ANTECUBITAL  Final   Special Requests   Final    BOTTLES DRAWN AEROBIC AND ANAEROBIC Blood Culture adequate volume   Culture   Final    NO GROWTH 2 DAYS Performed at [facility], [address], [place], [place] [address]    Report Status PENDING  Incomplete  Blood culture (routine x 2)     Status: None (Preliminary result)   Collection Time: 07/07/23  2:08 PM   Specimen: BLOOD LEFT FOREARM  Result Value Ref Range Status   Specimen Description BLOOD LEFT FOREARM  Final   Special Requests   Final    BOTTLES DRAWN AEROBIC ONLY Blood Culture results may not be optimal due to an inadequate volume of blood received in culture bottles   Culture   Final    NO GROWTH 2 DAYS Performed at [facility], [address],  [place], [place] [address]    Report Status PENDING  Incomplete  Resp panel by RT-PCR (RSV, Flu A&B, Covid) Anterior Nasal Swab     Status: None   Collection Time: 07/07/23  3:46 PM   Specimen: Anterior Nasal Swab  Result Value Ref Range Status   SARS Coronavirus 2 by RT PCR NEGATIVE NEGATIVE Final   Influenza A by PCR NEGATIVE NEGATIVE Final   Influenza B by PCR NEGATIVE NEGATIVE Final    Comment: (NOTE) The Xpert Xpress SARS-CoV-2/FLU/RSV plus assay is intended as an aid in the diagnosis of influenza from Nasopharyngeal swab specimens and should not be used as a sole basis for treatment. Nasal washings and aspirates are unacceptable for Xpert Xpress SARS-CoV-2/FLU/RSV testing.  Fact Sheet for Patients: bloggercourse.com  Fact Sheet for Healthcare Providers: seriousbroker.it  This test is not yet approved or cleared by the [place]  FDA and has been authorized for detection and/or diagnosis of SARS-CoV-2 by FDA under an Emergency Use Authorization (EUA). This EUA will remain in effect  (meaning this test can be used) for the duration of the COVID-19 declaration under Section 564(b)(1) of the Act, 21 U.S.C. section 360bbb-3(b)(1), unless the authorization is terminated or revoked.     Resp Syncytial Virus by PCR NEGATIVE NEGATIVE Final    Comment: (NOTE) Fact Sheet for Patients: bloggercourse.com  Fact Sheet for Healthcare Providers: seriousbroker.it  This test is not yet approved or cleared by the [place]  FDA and has been authorized for detection and/or diagnosis of SARS-CoV-2 by FDA under an Emergency Use Authorization (EUA). This EUA will remain in effect (meaning this test can be used) for the duration of the COVID-19 declaration under Section 564(b)(1) of the Act, 21 U.S.C. section 360bbb-3(b)(1), unless the authorization is terminated or revoked.  Performed at [facility], [address], [place], [place] [address]     Jomarie Fleeta Rothman, MD [facility] [phone] pager  07/09/2023, 11:09 PM

## 2023-07-09 NOTE — Progress Notes (Signed)
 Pt receives out-pt HD at [facility] on MWF. Will assist as needed.   Olivia Canter Renal Navigator [phone]

## 2023-07-09 NOTE — Progress Notes (Signed)
 Progress Note     Subjective: Ongoing pain at I&D site. Having some rectal bleeding which he states is not new - has been happening for months and he has reached out to his established providers at [facility] previously  Objective: Vital signs in last 24 hours: Temp:  [97.5 F (36.4 C)-98.3 F (36.8 C)] 98.3 F (36.8 C) (01/02 0733) Pulse Rate:  [34-122] 59 (01/02 0733) Resp:  [16-29] 20 (01/02 0738) BP: (91-111)/(45-81) 103/53 (01/02 0733) SpO2:  [74 %-100 %] 97 % (01/02 0733) Last BM Date : 07/08/23  Intake/Output from previous day: 01/01 0701 - 01/02 0700 In: 10  Out: 1450 [Stool:1450] Intake/Output this shift: No intake/output data recorded.  PE: General: pleasant, WD, male who is laying in bed in NAD Lungs; Respiratory effort nonlabored MSK: all 4 extremities are symmetrical with no cyanosis, clubbing, or edema. GU: L groin wound with minimal purulent drainage - improved from yesterday. No necrotic tissue. Mild surrounding erythema without induration also improved from yesterday Skin: warm and dry Psych: A&Ox3 with an appropriate affect.   Seen with bedside RN as chaperone  Lab Results:  Recent Labs    07/08/23 0527 07/09/23 0314  WBC 11.8* 13.0*  HGB 8.6* 9.1*  HCT 26.3* 27.4*  PLT 128* 140*   BMET Recent Labs    07/08/23 0527 07/09/23 0314  NA 131* 130*  K 4.5 4.6  CL 93* 90*  CO2 24 23  GLUCOSE 88 85  BUN 38* 48*  CREATININE 6.42* 8.03*  CALCIUM  8.8* 8.7*   PT/INR No results for input(s): LABPROT, INR in the last 72 hours. CMP     Component Value Date/Time   NA 130 (L) 07/09/2023 0314   NA 138 04/10/2023 1541   K 4.6 07/09/2023 0314   CL 90 (L) 07/09/2023 0314   CO2 23 07/09/2023 0314   GLUCOSE 85 07/09/2023 0314   BUN 48 (H) 07/09/2023 0314   BUN 19 04/10/2023 1541   CREATININE 8.03 (H) 07/09/2023 0314   CALCIUM  8.7 (L) 07/09/2023 0314   PROT 6.0 (L) 07/08/2023 0527   ALBUMIN  2.7 (L) 07/09/2023 0314   AST 17 07/08/2023 0527   ALT  18 07/08/2023 0527   ALKPHOS 71 07/08/2023 0527   BILITOT 0.7 07/08/2023 0527   GFRNONAA 6 (L) 07/09/2023 0314   Lipase     Component Value Date/Time   LIPASE 70 (H) 11/17/2021 2211       Studies/Results: CT ABDOMEN PELVIS W CONTRAST Result Date: 07/07/2023 CLINICAL DATA:  Groin abscess.  Dyspnea. EXAM: CT ABDOMEN AND PELVIS WITH CONTRAST TECHNIQUE: Multidetector CT imaging of the abdomen and pelvis was performed using the standard protocol following bolus administration of intravenous contrast. RADIATION DOSE REDUCTION: This exam was performed according to the departmental dose-optimization program which includes automated exposure control, adjustment of the mA and/or kV according to patient size and/or use of iterative reconstruction technique. CONTRAST:  75mL OMNIPAQUE  IOHEXOL  350 MG/ML SOLN COMPARISON:  06/11/2023 CT abdomen/pelvis. FINDINGS: Lower chest: No acute abnormality at the lung bases. Hepatobiliary: Normal liver size. No liver mass. Cholelithiasis. No gallbladder distention. No definite gallbladder wall thickening. No pericholecystic fluid. No biliary ductal dilatation. Pancreas: Generalized pancreatic atrophy with no discrete mass or significant duct dilation, unchanged since baseline 11/17/2021 CT. Spleen: Normal size. No mass. Adrenals/Urinary Tract: Normal adrenals. Severe bilateral renal atrophy, unchanged. Simple 1.4 cm posterior upper right renal cyst and exophytic simple 1.5 cm interpolar left renal cyst with additional subcentimeter hypodense left renal cortical lesions that  are too small to characterize, unchanged, for which no follow-up imaging is recommended. No hydronephrosis. Left percutaneous pigtail nephrostomy tube with tip in the lower left renal collecting system. Stable relatively collapsed bladder with irregular diffuse bladder wall thickening. Stomach/Bowel: Normal non-distended stomach. 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 No dilated or thick-walled small bowel loops. No pneumatosis. Chronic irregular thick walled presacral space 5.7 x 4.5 cm fluid collection (series 3/image 64), extending into the left ischio-anal fat, previously 4.6 x 4.0 cm on 06/11/2023 CT, slightly increased. Vascular/Lymphatic: Atherosclerotic nonaneurysmal abdominal aorta. Patent portal, splenic, hepatic and renal veins. No pathologically enlarged lymph nodes in the abdomen or pelvis. Reproductive: Stable mild prostatomegaly. 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 Other: No pneumoperitoneum. No ascites. Partially visualized surgical grafts arising from the left common femoral artery and vein and extending inferiorly in the subcutaneous ventral left thigh with stable small 1.9 x 1.6 cm fluid collection at the anterior origin of the surgical grafts (series 3/image 73). Musculoskeletal: No aggressive appearing focal osseous lesions. Moderate thoracolumbar spondylosis. 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 IMPRESSION: 1. Chronic irregular thick walled presacral space 5.7 x 4.5 cm fluid collection, extending into the left ischio-anal fat, slightly increased in size since 06/11/2023 CT. 2. Partially visualized surgical grafts arising from the left common femoral artery and vein and extending inferiorly in the subcutaneous ventral left thigh with stable small 1.9 x 1.6 cm fluid collection at the anterior origin of the surgical grafts. 3. 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 No evidence of acute small bowel complication. 4. Cholelithiasis. 5. Left percutaneous pigtail nephrostomy tube with tip in place in the lower left renal collecting system. No hydronephrosis. 6. Stable  relatively collapsed bladder with irregular diffuse bladder wall thickening, nonspecific. Mild prostatomegaly. 7.  Aortic Atherosclerosis (ICD10-I70.0). Electronically Signed   By: Selinda DELENA Blue M.D.   On: 07/07/2023 18:25   DG Chest Portable 1 View Result Date: 07/07/2023 CLINICAL DATA:  Dyspnea EXAM: PORTABLE CHEST 1 VIEW COMPARISON:  04/10/2023 chest radiograph. FINDINGS: Vascular stent overlies the right subclavian region. Stable cardiomediastinal silhouette with normal heart size. No pneumothorax. No pleural effusion. No pulmonary edema. No acute consolidative airspace disease. Chronic streaky mild left lung base scarring versus atelectasis. IMPRESSION: No active disease. Chronic streaky mild left lung base scarring versus atelectasis. Electronically Signed   By: Selinda DELENA Blue M.D.   On: 07/07/2023 18:12    Anti-infectives: Anti-infectives (From admission, onward)    Start     Dose/Rate Route Frequency Ordered Stop   07/08/23 2200  piperacillin -tazobactam (ZOSYN ) IVPB 2.25 g        2.25 g 100 mL/hr over 30 Minutes Intravenous Every 8 hours 07/08/23 2124     07/08/23 0600  piperacillin -tazobactam (ZOSYN ) IVPB 3.375 g  Status:  Discontinued        3.375 g 12.5 mL/hr over 240 Minutes Intravenous Every 12 hours 07/08/23 0518 07/08/23 2124   07/08/23 0200  linezolid  (ZYVOX ) IVPB 600 mg        600 mg 300 mL/hr over 60 Minutes Intravenous Every 12 hours 07/07/23 2117     07/07/23 2300  piperacillin -tazobactam (ZOSYN ) IVPB 2.25 g  Status:  Discontinued        2.25 g 100  mL/hr over 30 Minutes Intravenous Every 8 hours 07/07/23 2121 07/08/23 0517   07/07/23 1415  piperacillin -tazobactam (ZOSYN ) IVPB 3.375 g        3.375 g 100 mL/hr over 30 Minutes Intravenous  Once 07/07/23 1404 07/07/23 1555   07/07/23 1415  clindamycin  (CLEOCIN ) IVPB 900 mg  Status:  Discontinued        900 mg 100 mL/hr over 30 Minutes Intravenous  Once 07/07/23 1404 07/07/23 1414   07/07/23 1415  linezolid  (ZYVOX ) IVPB 600  mg        600 mg 300 mL/hr over 60 Minutes Intravenous STAT 07/07/23 1414 07/07/23 1911        Assessment/Plan  L groin abscess S/p I&D 12/31 Dr. Ann in clinic On abx, afebrile. WBC 13.0 from 11.8 yesterday but improved since admission at 15.8 Will follow wound culture collected in clinic Bid wet to dry dressing changes - he states he can do this himself once stable to discharge.  Hgb stable  FEN: reg ID: zosyn /zyvox  VTE: eliquis   I reviewed hospitalist notes, last 24 h vitals and pain scores, last 48 h intake and output, last 24 h labs and trends, and last 24 h imaging results.    LOS: 2 days   [place] VEAR Mais, [facility] 07/09/2023, 11:03 AM Please see Amion for pager number during day hours 7:00am-4:30pm

## 2023-07-09 NOTE — Progress Notes (Addendum)
 HOSPITALIST                ROUNDING                 NOTE Joshua Riley FMW:[id number]  DOB: 1935-08-29  DOA: 07/07/2023  PCP: Katrinka Garnette KIDD, MD  07/09/2023,11:24 AM   LOS: 2 days      Code Status: Full code From: Home  current Dispo: Likely home     88 year old ESRD MWF right femoral TDC-C/P hypotension on midodrine   Dialyzes via left thigh AVG placed [facility] 09/25/2021 advised by Dr. 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 R PCN exchange 06/25/2023  Seen in office Dr. Ann 12/30 for left groin abscess-history of having this for 3 weeks-treated 12/20 doxycycline  100 twice daily X 10 days-had severe pain-was given tramadol --- attempt at I&D on  12/31 patient was asked to be admitted by general surgery   Plan  Sepsis on admission 2/2 infected groin abscess Currently Zyvox  in addition to Zosyn -narrow once cultures from ID clinic are back surgery-->wet-to-dry dressings no overt surgical plan--first tylenol --- resumed tramadol  50 every 8 as needed severe pain  Presacral space 5.7X 4.5 cm fluid collection extending into the left ischial anal fat slightly increased from 06/11/2023 Discussed 07/10/2023 Dr. Stevie with general surgery who has discussed with IR-we will place a drain in the area now that it is larger-he is not a candidate for overt surgery Will need labs followed up as well as drainage culture followed up  ESRD MWF via TDC complicated by hypotension Continues on midodrine  15 x 3 times daily meals--- not blood pressure meds PTA in OP Continue Renvela  2.4 3 times daily meals  A-fib CHADVASC >3 on Eliquis  Eliquis  held for drain-start subcu heparin  postprocedure Not on any rate control keep on monitors overnight and reassess as needed  Depression continue Zoloft  25 daily  Obstructive uropathy with chronic left PCN Secondary to multiple strictures Patient tells me that the area is cloudy and turbid-I will get a sample as this is not his normal-he had the PCN changed about 2 weeks ago and gets it changed every 4 weeks and he will need to continue this schedule  Ulcerative colitis in total colectomy 2011 with ileostomy--ileal pouch anal anastomosis surgery 2012 --prior SBO 11/23/2021-see above Rectal bleed 2/2 likely gluteal cleft fistula This  is chronic and there is no specific treatment at this time going on for 6 months he has been told to follow-up with wound care at [facility] Given he is not actively extravasating from this area I feel that is reasonable  Previous DVT remotely  CAD--no ischemic workup previously   DVT prophylaxis: Eliquis   Status is: Inpatient Remains inpatient appropriate because:   Requires  further inpatient management      Subjective:  Seen on HD--- feels weak with standing-has some trickling of blood at rest no significant bleeding Drainage has increased from the groin-because he was in HD I did not examine the area No chest pain Overall stable Noted General Surgery/ID documentation appreciate care coordination   Objective + exam Vitals:   07/08/23 1954 07/09/23 0432 07/09/23 0733 07/09/23 0738  BP: (!) 92/53 (!) 93/48 (!) 103/53   Pulse: (!) 34  (!) 59   Resp: 20 17 20 20   Temp: (!) 97.5 F (36.4 C)  98.3 F (36.8 C)   TempSrc: Oral  Oral   SpO2: 100%  97%   Weight:      Height:       Filed Weights   07/07/23 1536  Weight: 67.1 kg    Examination:  Alert coherent no icterus no pallor no wheeze Chest clear abdomen soft no rebound ostomy in place right-sided I did not examine wound in left groin nor rectum today Power 5/5   Data Reviewed: reviewed   CBC    Component Value Date/Time   WBC 13.0 (H) 07/09/2023 0314   RBC 2.22 (L) 07/09/2023 0314   HGB 9.1 (L) 07/09/2023 0314   HGB 10.9 (L) 04/10/2023 1541   HCT 27.4 (L) 07/09/2023 0314   HCT 33.2 (L) 04/10/2023 1541   PLT 140 (L) 07/09/2023 0314   PLT 207 04/10/2023 1541   MCV 123.4 (H) 07/09/2023 0314   MCV 119 (H) 04/10/2023 1541   MCH 41.0 (H) 07/09/2023 0314   MCHC 33.2 07/09/2023 0314   RDW 15.6 (H) 07/09/2023 0314   RDW 13.2 04/10/2023 1541   LYMPHSABS 0.8 07/08/2023 0527   LYMPHSABS 0.8 04/10/2023 1541   MONOABS 0.9 07/08/2023 0527   EOSABS 0.1 07/08/2023 0527   EOSABS 0.3 04/10/2023 1541   BASOSABS 0.0 07/08/2023 0527   BASOSABS 0.1 04/10/2023 1541      Latest Ref Rng & Units 07/09/2023    3:14 AM 07/08/2023    5:27 AM 07/07/2023    2:07 PM  CMP  Glucose 70 - 99 mg/dL 85  88  885   BUN 8 - 23 mg/dL 48  38  30   Creatinine 0.61 - 1.24 mg/dL 1.96  3.57  4.60   Sodium 135 - 145 mmol/L 130  131  135   Potassium 3.5 - 5.1 mmol/L 4.6  4.5  4.5   Chloride 98 - 111 mmol/L 90  93   94   CO2 22 - 32 mmol/L 23  24  27    Calcium  8.9 - 10.3 mg/dL 8.7  8.8  9.4   Total Protein 6.5 - 8.1 g/dL  6.0  7.1   Total Bilirubin 0.0 - 1.2 mg/dL  0.7  0.7   Alkaline Phos 38 - 126 U/L  71  85   AST 15 - 41 U/L  17  22   ALT 0 - 44 U/L  18  22     Scheduled Meds:  apixaban   2.5 mg Oral BID   Chlorhexidine  Gluconate Cloth  6  each Topical Q0600   midodrine   10 mg Oral TID WC   mometasone -formoterol   2 puff Inhalation BID   sertraline   25 mg Oral QHS   sevelamer  carbonate  2,400 mg Oral TID WC   Continuous Infusions:  linezolid  (ZYVOX ) IV 600 mg (07/09/23 0930)   piperacillin -tazobactam (ZOSYN )  IV 2.25 g (07/09/23 0608)    Time 43  Colen Grimes, MD  [facility]

## 2023-07-09 NOTE — Discharge Instructions (Signed)
 WOUND CARE: - dressing to be changed twice daily - supplies: sterile saline, gauze, scissors, ABD pads, tape  - remove dressing and all packing dampen clean gauze with sterile saline and pack wound from wound base to skin level, making sure to take note of any possible areas of wound tracking, tunneling and packing appropriately. Wound can be packed loosely. cover wound with a dry ABD pad or gauze and secure with tape or underwear.  - change dressing as needed if leakage occurs, wound gets contaminated, or to shower. - may shower daily with wound open and following the shower the wound should be dried and a clean dressing placed.

## 2023-07-10 DIAGNOSIS — Z8719 Personal history of other diseases of the digestive system: Secondary | ICD-10-CM

## 2023-07-10 DIAGNOSIS — R399 Unspecified symptoms and signs involving the genitourinary system: Secondary | ICD-10-CM

## 2023-07-10 DIAGNOSIS — Z7901 Long term (current) use of anticoagulants: Secondary | ICD-10-CM

## 2023-07-10 DIAGNOSIS — L02214 Cutaneous abscess of groin: Secondary | ICD-10-CM | POA: Diagnosis not present

## 2023-07-10 DIAGNOSIS — N186 End stage renal disease: Secondary | ICD-10-CM | POA: Diagnosis not present

## 2023-07-10 DIAGNOSIS — I480 Paroxysmal atrial fibrillation: Secondary | ICD-10-CM | POA: Diagnosis not present

## 2023-07-10 DIAGNOSIS — K6819 Other retroperitoneal abscess: Secondary | ICD-10-CM | POA: Diagnosis not present

## 2023-07-10 LAB — CBC
HCT: 27.7 % — ABNORMAL LOW (ref 39.0–52.0)
HCT: 27.6 % — ABNORMAL LOW (ref 39.0–52.0)
Hemoglobin: 9.5 g/dL — ABNORMAL LOW (ref 13.0–17.0)
Hemoglobin: 9.3 g/dL — ABNORMAL LOW (ref 13.0–17.0)
MCH: 41.9 pg — ABNORMAL HIGH (ref 26.0–34.0)
MCH: 41.3 pg — ABNORMAL HIGH (ref 26.0–34.0)
MCHC: 34.3 g/dL (ref 30.0–36.0)
MCHC: 33.7 g/dL (ref 30.0–36.0)
MCV: 122 fL — ABNORMAL HIGH (ref 80.0–100.0)
MCV: 122.7 fL — ABNORMAL HIGH (ref 80.0–100.0)
Platelets: 167 10*3/uL (ref 150–400)
Platelets: 160 10*3/uL (ref 150–400)
RBC: 2.27 MIL/uL — ABNORMAL LOW (ref 4.22–5.81)
RBC: 2.25 MIL/uL — ABNORMAL LOW (ref 4.22–5.81)
RDW: 15.3 % (ref 11.5–15.5)
RDW: 15.1 % (ref 11.5–15.5)
WBC: 13.2 10*3/uL — ABNORMAL HIGH (ref 4.0–10.5)
WBC: 14 10*3/uL — ABNORMAL HIGH (ref 4.0–10.5)
nRBC: 0 % (ref 0.0–0.2)
nRBC: 0 % (ref 0.0–0.2)

## 2023-07-10 LAB — RENAL FUNCTION PANEL
Albumin: 2.5 g/dL — ABNORMAL LOW (ref 3.5–5.0)
Albumin: 2.4 g/dL — ABNORMAL LOW (ref 3.5–5.0)
Anion gap: 19 — ABNORMAL HIGH (ref 5–15)
Anion gap: 16 — ABNORMAL HIGH (ref 5–15)
BUN: 63 mg/dL — ABNORMAL HIGH (ref 8–23)
BUN: 62 mg/dL — ABNORMAL HIGH (ref 8–23)
CO2: 19 mmol/L — ABNORMAL LOW (ref 22–32)
CO2: 25 mmol/L (ref 22–32)
Calcium: 8.4 mg/dL — ABNORMAL LOW (ref 8.9–10.3)
Calcium: 8.5 mg/dL — ABNORMAL LOW (ref 8.9–10.3)
Chloride: 89 mmol/L — ABNORMAL LOW (ref 98–111)
Chloride: 87 mmol/L — ABNORMAL LOW (ref 98–111)
Creatinine, Ser: 9.79 mg/dL — ABNORMAL HIGH (ref 0.61–1.24)
Creatinine, Ser: 9.89 mg/dL — ABNORMAL HIGH (ref 0.61–1.24)
GFR, Estimated: 5 mL/min — ABNORMAL LOW (ref >60)
GFR, Estimated: 5 mL/min — ABNORMAL LOW (ref >60)
Glucose, Bld: 73 mg/dL (ref 70–99)
Glucose, Bld: 105 mg/dL — ABNORMAL HIGH (ref 70–99)
Phosphorus: 8.2 mg/dL — ABNORMAL HIGH (ref 2.5–4.6)
Phosphorus: 8.3 mg/dL — ABNORMAL HIGH (ref 2.5–4.6)
Potassium: 5.6 mmol/L — ABNORMAL HIGH (ref 3.5–5.1)
Potassium: 5.3 mmol/L — ABNORMAL HIGH (ref 3.5–5.1)
Sodium: 127 mmol/L — ABNORMAL LOW (ref 135–145)
Sodium: 128 mmol/L — ABNORMAL LOW (ref 135–145)

## 2023-07-10 LAB — HEPATITIS B SURFACE ANTIBODY, QUANTITATIVE: Hep B S AB Quant (Post): 26.4 m[IU]/mL

## 2023-07-10 MED ORDER — HEPARIN SODIUM (PORCINE) 1000 UNIT/ML DIALYSIS
1000.0000 [IU] | INTRAMUSCULAR | Status: DC | PRN
  Filled 2023-07-10: qty 1

## 2023-07-10 MED ORDER — LIDOCAINE-PRILOCAINE 2.5-2.5 % EX CREA
1.0000 | TOPICAL_CREAM | CUTANEOUS | Status: DC | PRN
  Filled 2023-07-10: qty 5

## 2023-07-10 MED ORDER — ANTICOAGULANT SODIUM CITRATE 4% (200MG/5ML) IV SOLN
5.0000 mL | Status: DC | PRN
  Filled 2023-07-10: qty 5

## 2023-07-10 MED ORDER — ALBUMIN HUMAN 25 % IV SOLN
25.0000 g | Freq: Once | INTRAVENOUS | Status: AC
  Administered 2023-07-10: 25 g via INTRAVENOUS
  Filled 2023-07-10: qty 100

## 2023-07-10 MED ORDER — LIDOCAINE HCL (PF) 1 % IJ SOLN
5.0000 mL | INTRAMUSCULAR | Status: DC | PRN
  Filled 2023-07-10: qty 5

## 2023-07-10 MED ORDER — OXYCODONE-ACETAMINOPHEN 7.5-325 MG PO TABS
1.0000 | ORAL_TABLET | ORAL | Status: DC | PRN
  Administered 2023-07-10 – 2023-07-17 (×11): 1 via ORAL
  Filled 2023-07-10 (×11): qty 1

## 2023-07-10 MED ORDER — ALTEPLASE 2 MG IJ SOLR
2.0000 mg | Freq: Once | INTRAMUSCULAR | Status: DC | PRN
  Filled 2023-07-10: qty 2

## 2023-07-10 MED ORDER — ALBUMIN HUMAN 25 % IV SOLN
25.0000 g | Freq: Once | INTRAVENOUS | Status: AC
  Administered 2023-07-10: 25 g via INTRAVENOUS

## 2023-07-10 MED ORDER — ALBUMIN HUMAN 25 % IV SOLN: 12.5000 g | Freq: Once | INTRAVENOUS | Status: DC

## 2023-07-10 MED ORDER — MIDODRINE HCL 5 MG PO TABS
10.0000 mg | ORAL_TABLET | Freq: Once | ORAL | Status: AC
  Administered 2023-07-10: 10 mg via ORAL

## 2023-07-10 MED ORDER — PENTAFLUOROPROP-TETRAFLUOROETH EX AERO: 1.0000 | INHALATION_SPRAY | CUTANEOUS | Status: DC | PRN

## 2023-07-10 MED ORDER — LINEZOLID 600 MG PO TABS
600.0000 mg | ORAL_TABLET | Freq: Two times a day (BID) | ORAL | Status: DC
  Administered 2023-07-10 – 2023-07-14 (×8): 600 mg via ORAL
  Filled 2023-07-10 (×9): qty 1

## 2023-07-10 NOTE — Progress Notes (Signed)
 07/09/23 PN  HOSPITALIST                ROUNDING                 NOTE Joshua Riley FMW:[id number]  DOB: 23-Dec-1935  DOA: 07/07/2023  PCP: Katrinka Garnette KIDD, MD  07/09/2023,11:24 AM             LOS: 2 days                Code Status: Full code From: Home              current Dispo: Likely home                           88 year old ESRD MWF right femoral TDC-C/P hypotension on midodrine               Dialyzes via left thigh AVG placed [facility] 09/25/2021 advised by Dr. 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 R PCN exchange 06/25/2023   Seen in office Dr. Ann 12/30 for left groin abscess-history of having this for 3 weeks-treated 12/20 doxycycline  100 twice daily X 10 days-had severe pain-was given tramadol --- attempt at I&D on 12/31 patient was asked to be admitted by general surgery     Plan   Sepsis on admission 2/2 infected groin abscess Currently Zyvox  in addition to Zosyn -narrow once cultures from ID clinic are back surgery-->wet-to-dry dressings no overt surgical plan--may require initiation of pain meds-currently on Tylenol --- resumed tramadol  50 every 8 as needed pain   Presacral space 5.7X 4.5 cm fluid collection extending into the left ischial anal fat slightly increased from 06/11/2023 Discussed 07/08/2023 + Dr. Ebbie of surgery and this will need to be followed in the outpatient setting by his [facility] physicians   ESRD MWF via [facility] complicated by hypotension Continues on midodrine  15 x 3 times daily meals--- not blood pressure meds PTA in OP Continue Renvela  2.4 3 times daily meals   A-fib CHADVASC >3 on  Eliquis  Eliquis  initially held currently resumed 2.5 twice daily Not on any rate control keep on monitors overnight and reassess as needed  Depression continue Zoloft  25 daily   Obstructive uropathy with chronic left PCN Secondary to multiple strictures Patient tells me that the area is cloudy and turbid-I will get a sample as this is not his normal-he had the PCN changed about 2 weeks ago and gets it changed every 4 weeks and he will need to continue this schedule   Ulcerative colitis in total colectomy with ileostomy prior SBO 11/23/2021 Rectal bleed 2/2 likely fistula This is scant and seems to be coming from probably a fistula from his ulcerative colitis-this is been chronic and going on for 6 months he has been told to follow-up with wound care at [facility] Given he is not actively extravasating from this area I feel that is reasonable   CAD--no ischemic workup previously     DVT prophylaxis: Eliquis    Status is: Inpatient Remains inpatient appropriate because:  Requires further inpatient management         Subjective:   Doing fair, pain is moderate in the groin-reports of some bleeding from rectal area without stool-it seems like that is a fissure/fistula that he has had for some time No chest pain No fever No nausea no vomiting Overall improved   Objective + exam       Vitals:    07/08/23 1954 07/09/23 0432 07/09/23 0733 07/09/23 0738  BP: (!) 92/53 (!) 93/48 (!) 103/53    Pulse: (!) 34   (!) 59    Resp: 20 17 20 20   Temp: (!) 97.5 F (36.4 C)   98.3 F (36.8 C)    TempSrc: Oral   Oral    SpO2: 100%   97%    Weight:          Height:               Filed Weights    07/07/23 1536  Weight: 67.1 kg      Examination: EOMI NCAT no focal deficit no icterus no pallor Chest is clear no wheeze rales rhonchi Abdomen is soft he has an ostomy on the right side he has a PCN on the left but he says it looks more turbid Groin wound is covered and is wrapped and  dressed--- I did not examine today     Data Reviewed: reviewed    CBC Labs (Brief)          Component Value Date/Time    WBC 13.0 (H) 07/09/2023 0314    RBC 2.22 (L) 07/09/2023 0314    HGB 9.1 (L) 07/09/2023 0314    HGB 10.9 (L) 04/10/2023 1541    HCT 27.4 (L) 07/09/2023 0314    HCT 33.2 (L) 04/10/2023 1541    PLT 140 (L) 07/09/2023 0314    PLT 207 04/10/2023 1541    MCV 123.4 (H) 07/09/2023 0314    MCV 119 (H) 04/10/2023 1541    MCH 41.0 (H) 07/09/2023 0314    MCHC 33.2 07/09/2023 0314    RDW 15.6 (H) 07/09/2023 0314    RDW 13.2 04/10/2023 1541    LYMPHSABS 0.8 07/08/2023 0527    LYMPHSABS 0.8 04/10/2023 1541    MONOABS 0.9 07/08/2023 0527    EOSABS 0.1 07/08/2023 0527    EOSABS 0.3 04/10/2023 1541    BASOSABS 0.0 07/08/2023 0527    BASOSABS 0.1 04/10/2023 1541          Latest Ref Rng & Units 07/09/2023    3:14 AM 07/08/2023    5:27 AM 07/07/2023    2:07 PM  CMP  Glucose 70 - 99 mg/dL 85  88  885   BUN 8 - 23 mg/dL 48  38  30   Creatinine 0.61 - 1.24 mg/dL 1.96  3.57  4.60   Sodium 135 - 145 mmol/L 130  131  135   Potassium 3.5 - 5.1 mmol/L 4.6  4.5  4.5   Chloride 98 - 111 mmol/L 90  93  94   CO2 22 - 32 mmol/L 23  24  27    Calcium  8.9 - 10.3 mg/dL 8.7  8.8  9.4   Total Protein 6.5 - 8.1 g/dL   6.0  7.1   Total Bilirubin 0.0 - 1.2 mg/dL   0.7  0.7   Alkaline Phos 38 - 126 U/L   71  85   AST 15 - 41 U/L   17  22   ALT  0 - 44 U/L   18  22       Scheduled Meds:  apixaban   2.5 mg Oral BID   Chlorhexidine  Gluconate Cloth  6 each Topical Q0600   midodrine   10 mg Oral TID WC   mometasone -formoterol   2 puff Inhalation BID   sertraline   25 mg Oral QHS   sevelamer  carbonate  2,400 mg Oral TID WC        Continuous Infusions:  linezolid  (ZYVOX ) IV 600 mg (07/09/23 0930)   piperacillin -tazobactam (ZOSYN )  IV 2.25 g (07/09/23 0608)          Time 43   Colen Grimes, MD  [facility]

## 2023-07-10 NOTE — Progress Notes (Signed)
 Plan of care is reviewed. Pt has been stable hemodynamically, BP soft per baseline, MD is aware. Pt is afebrile, normal respiration, no distress overnight.   Left groin has minimal serosanguinous drainage, wet to dry dressing changed BID. Pt has copious pink purulent rectal drainage. Absorbant pad changed x 2. No active bleeding. His Ileostomy bag is emptied frequently with liquid greenish watery stool.   Pain on his left groin has been more intense. Tramadol   50 mg q 8 hrs and Tylenol  PRN were given. Pt requested more pain med. Dr. Franky was notified. Awaiting for MD response.   We will continue to monitor.  Wendi Dash, RN

## 2023-07-10 NOTE — Progress Notes (Signed)
 I reached out to Dr. Loleta Chance at [facility]. Given increase in size and that IR drain was the next planned step for treatment, we will discuss with IR for drainage while inpatient. -Stop eliquis today

## 2023-07-10 NOTE — Addendum Note (Signed)
 Addended byClyda Greener M on: 07/10/2023 09:41 AM   Modules accepted: Orders

## 2023-07-10 NOTE — Progress Notes (Signed)
      Chief Complaint/Subjective: Continued drainage from left groin  Objective: Vital signs in last 24 hours: Temp:  [95.9 F (35.5 C)-98.2 F (36.8 C)] 95.9 F (35.5 C) (01/03 0817) Pulse Rate:  [54-74] 54 (01/03 0400) Resp:  [15-20] 16 (01/03 0817) BP: (87-106)/(43-56) 106/54 (01/03 0817) SpO2:  [90 %-100 %] 99 % (01/03 0400) Weight:  [65.9 kg] 65.9 kg (01/03 0500) Last BM Date : 07/10/23 Intake/Output from previous day: 01/02 0701 - 01/03 0700 In: 1826 [P.O.:730; IV Piggyback:1096] Out: 1405 [Urine:5; Stool:1400]  PE: Gen: NAd Resp: nonlabored Card: bradycardic Abd: soft, left groin wound open with small amount thick white drainage  Lab Results:  Recent Labs    07/09/23 0314 07/10/23 0429  WBC 13.0* 13.2*  HGB 9.1* 9.5*  HCT 27.4* 27.7*  PLT 140* 167   Recent Labs    07/09/23 0314 07/10/23 0429  NA 130* 127*  K 4.6 5.6*  CL 90* 89*  CO2 23 19*  GLUCOSE 85 73  BUN 48* 63*  CREATININE 8.03* 9.79*  CALCIUM  8.7* 8.4*   No results for input(s): LABPROT, INR in the last 72 hours.    Component Value Date/Time   NA 127 (L) 07/10/2023 0429   NA 138 04/10/2023 1541   K 5.6 (H) 07/10/2023 0429   CL 89 (L) 07/10/2023 0429   CO2 19 (L) 07/10/2023 0429   GLUCOSE 73 07/10/2023 0429   BUN 63 (H) 07/10/2023 0429   BUN 19 04/10/2023 1541   CREATININE 9.79 (H) 07/10/2023 0429   CALCIUM  8.4 (L) 07/10/2023 0429   PROT 6.0 (L) 07/08/2023 0527   ALBUMIN  2.5 (L) 07/10/2023 0429   AST 17 07/08/2023 0527   ALT 18 07/08/2023 0527   ALKPHOS 71 07/08/2023 0527   BILITOT 0.7 07/08/2023 0527   GFRNONAA 5 (L) 07/10/2023 0429    Assessment/Plan  L groin abscess S/p I&D 12/31 Dr. Ann in clinic On abx, afebrile. WBC 13.0 from 11.8 yesterday but improved since admission at 15.8 Will follow wound culture collected in clinic Bid wet to dry dressing changes - he states he can do this himself once stable to discharge.  Hgb stable  I have reviewed his chart  extensively about the presacral fluid collection. It had a drain in it 4 months ago and was treated for infection, it was separated after drain dislodgement which was sterile at the time. On imaging now it is larger. It is not near intestine. It does not appear to track to the left groin. I have reached out to Dr. Leigh to try to coordinate best plans for care of this complex condition.   FEN: reg ID: [email or web address]  VTE: eliquis    LOS: 3 days   I reviewed last 24 h vitals and pain scores, last 48 h intake and output, last 24 h labs and trends, and last 24 h imaging results.  This care required high  level of medical decision making.   Herlene Righter [facility] 07/10/2023, 10:50 AM Please see Amion for pager number during day hours 7:00am-4:30pm or 7:00am -11:30am on weekends

## 2023-07-10 NOTE — Progress Notes (Signed)
 Patient ID: Joshua Riley, male   DOB: [birth year], 88 y.o.   MRN: [id number] S: No new complaints. O:BP (!) 106/54 (BP Location: Right Arm)   Pulse (!) 54   Temp (!) 95.9 F (35.5 C) (Axillary) Comment: couldnt get an oral temp so i tried axillary  Resp 16   Ht 5' 5 (1.651 m)   Wt 65.9 kg   SpO2 99%   BMI 24.18 kg/m   Intake/Output Summary (Last 24 hours) at 07/10/2023 1038 Last data filed at 07/10/2023 0823 Gross per 24 hour  Intake 1586.04 ml  Output 1255 ml  Net 331.04 ml   Intake/Output: I/O last 3 completed shifts: In: 1826 [P.O.:730; IV Piggyback:1096] Out: 2105 [Urine:5; Stool:2100]  Intake/Output this shift:  Total I/O In: -  Out: 150 [Stool:150] Weight change:  Gen: NAD CVS: RRR Resp:CTA Abd: +BS, soft, NT/ND Ext: no edema, left thigh AVG +T/B  Recent Labs  Lab 07/07/23 1407 07/08/23 0527 07/09/23 0314 07/10/23 0429  NA 135 131* 130* 127*  K 4.5 4.5 4.6 5.6*  CL 94* 93* 90* 89*  CO2 27 24 23  19*  GLUCOSE 114* 88 85 73  BUN 30* 38* 48* 63*  CREATININE 5.39* 6.42* 8.03* 9.79*  ALBUMIN  3.0* 2.6* 2.7* 2.5*  CALCIUM  9.4 8.8* 8.7* 8.4*  PHOS  --  6.2* 7.6* 8.2*  AST 22 17  --   --   ALT 22 18  --   --    Liver Function Tests: Recent Labs  Lab 07/07/23 1407 07/08/23 0527 07/09/23 0314 07/10/23 0429  AST 22 17  --   --   ALT 22 18  --   --   ALKPHOS 85 71  --   --   BILITOT 0.7 0.7  --   --   PROT 7.1 6.0*  --   --   ALBUMIN  3.0* 2.6* 2.7* 2.5*   No results for input(s): LIPASE, AMYLASE in the last 168 hours. No results for input(s): AMMONIA in the last 168 hours. CBC: Recent Labs  Lab 07/07/23 1407 07/08/23 0527 07/09/23 0314 07/10/23 0429  WBC 15.8* 11.8* 13.0* 13.2*  NEUTROABS 14.1* 9.8*  --   --   HGB 10.0* 8.6* 9.1* 9.5*  HCT 30.8* 26.3* 27.4* 27.7*  MCV 126.7* 126.4* 123.4* 122.0*  PLT 149* 128* 140* 167   Cardiac Enzymes: No results for input(s): CKTOTAL, CKMB, CKMBINDEX, TROPONINI in the last 168  hours. CBG: Recent Labs  Lab 07/09/23 1229  GLUCAP 96    Iron Studies: No results for input(s): IRON, TIBC, TRANSFERRIN, FERRITIN in the last 72 hours. Studies/Results: No results found.  apixaban   2.5 mg Oral BID   Chlorhexidine  Gluconate Cloth  6 each Topical Q0600   linezolid   600 mg Oral Q12H   midodrine   10 mg Oral TID WC   mometasone -formoterol   2 puff Inhalation BID   sertraline   25 mg Oral QHS   sevelamer  carbonate  2,400 mg Oral TID WC    BMET    Component Value Date/Time   NA 127 (L) 07/10/2023 0429   NA 138 04/10/2023 1541   K 5.6 (H) 07/10/2023 0429   CL 89 (L) 07/10/2023 0429   CO2 19 (L) 07/10/2023 0429   GLUCOSE 73 07/10/2023 0429   BUN 63 (H) 07/10/2023 0429   BUN 19 04/10/2023 1541   CREATININE 9.79 (H) 07/10/2023 0429   CALCIUM  8.4 (L) 07/10/2023 0429   GFRNONAA 5 (L) 07/10/2023 0429   CBC    Component  Value Date/Time   WBC 13.2 (H) 07/10/2023 0429   RBC 2.27 (L) 07/10/2023 0429   HGB 9.5 (L) 07/10/2023 0429   HGB 10.9 (L) 04/10/2023 1541   HCT 27.7 (L) 07/10/2023 0429   HCT 33.2 (L) 04/10/2023 1541   PLT 167 07/10/2023 0429   PLT 207 04/10/2023 1541   MCV 122.0 (H) 07/10/2023 0429   MCV 119 (H) 04/10/2023 1541   MCH 41.9 (H) 07/10/2023 0429   MCHC 34.3 07/10/2023 0429   RDW 15.3 07/10/2023 0429   RDW 13.2 04/10/2023 1541   LYMPHSABS 0.8 07/08/2023 0527   LYMPHSABS 0.8 04/10/2023 1541   MONOABS 0.9 07/08/2023 0527   EOSABS 0.1 07/08/2023 0527   EOSABS 0.3 04/10/2023 1541   BASOSABS 0.0 07/08/2023 0527   BASOSABS 0.1 04/10/2023 1541    Dialysis Orders: Center: [facility]  on MWF . 180NRe 2 hr 45 min BFR 350 DFR Auto 1.5 EDW 68.5kg 2K 2.5Ca AVG 15g Heparin  5000 unit bolus Mircera 120mcg IV q 4 weeks- last dose 06/24/23 Calcitriol  0.25 mcg PO q HD   Assessment/Plan:  Sepsis d/t groin abscess: S/p I&D, on antibiotics per PMD/general surgery team  ESRD:  On MWF schedule, last HD was Tuesday due to the holiday. Will plan for next HD  today to get back on regular schedule. Patient notes AVG has had prolonged bleeding post HD lately so will hold heparin  for now.  Hypertension/volume: BP soft/stable, on midodrine . Has been leaving below his EDW, possible there is a component of fluid overload contributing to his dyspnea. UFG 1.5L as tolerated, does not tolerate high UF due to hypotension  Anemia: Hgb 9.1, likely needs more frequent ESA dosing, will order aranesp   Metabolic bone disease: Calcium  controlled, phos elevated. Resume VDRA and home binders Hyperkalemia - should resolve with HD.  Nutrition:  Alb 2.7, will need renal diet/protein supplements Chronic obstructive uropathy: With chronic left nephrostomy tube, reportedly some concern for infection but already on abx as abovehronic obstructive uropathy  Fairy RONAL Sellar, MD [facility]

## 2023-07-10 NOTE — Progress Notes (Signed)
 Received patient in bed.Awake,alert and oriented x 4. Consent verified.  Access used : Left thigh AVG that worked well.  Duration of treatment: 2.45  hours.  UF : Not tolerting UF.  Medicines given: Midodrine  10 mg -extrea dose.                     Albumin  50 gram.  Hemo comment:Inspite of the above medicine given,Patient not tolerating uf.  Hand off to the patient's nurse : Back into his room with stable medical condition via transporter.

## 2023-07-10 NOTE — Telephone Encounter (Signed)
 Will send in rx with dx code restrictive lung disease. (J98.4)  other lung disorders ICD 10 code

## 2023-07-11 DIAGNOSIS — L02214 Cutaneous abscess of groin: Secondary | ICD-10-CM | POA: Diagnosis not present

## 2023-07-11 LAB — RENAL FUNCTION PANEL
Albumin: 3.1 g/dL — ABNORMAL LOW (ref 3.5–5.0)
Albumin: 3.5 g/dL (ref 3.5–5.0)
Anion gap: 14 (ref 5–15)
Anion gap: 17 — ABNORMAL HIGH (ref 5–15)
BUN: 26 mg/dL — ABNORMAL HIGH (ref 8–23)
BUN: 34 mg/dL — ABNORMAL HIGH (ref 8–23)
CO2: 28 mmol/L (ref 22–32)
CO2: 25 mmol/L (ref 22–32)
Calcium: 8.9 mg/dL (ref 8.9–10.3)
Calcium: 9.2 mg/dL (ref 8.9–10.3)
Chloride: 92 mmol/L — ABNORMAL LOW (ref 98–111)
Chloride: 90 mmol/L — ABNORMAL LOW (ref 98–111)
Creatinine, Ser: 5.8 mg/dL — ABNORMAL HIGH (ref 0.61–1.24)
Creatinine, Ser: 6.72 mg/dL — ABNORMAL HIGH (ref 0.61–1.24)
GFR, Estimated: 9 mL/min — ABNORMAL LOW (ref >60)
GFR, Estimated: 7 mL/min — ABNORMAL LOW (ref >60)
Glucose, Bld: 86 mg/dL (ref 70–99)
Glucose, Bld: 90 mg/dL (ref 70–99)
Phosphorus: 5.8 mg/dL — ABNORMAL HIGH (ref 2.5–4.6)
Phosphorus: 6.1 mg/dL — ABNORMAL HIGH (ref 2.5–4.6)
Potassium: 4.8 mmol/L (ref 3.5–5.1)
Potassium: 5.5 mmol/L — ABNORMAL HIGH (ref 3.5–5.1)
Sodium: 134 mmol/L — ABNORMAL LOW (ref 135–145)
Sodium: 132 mmol/L — ABNORMAL LOW (ref 135–145)

## 2023-07-11 LAB — CBC
HCT: 25.9 % — ABNORMAL LOW (ref 39.0–52.0)
Hemoglobin: 8.8 g/dL — ABNORMAL LOW (ref 13.0–17.0)
MCH: 41.9 pg — ABNORMAL HIGH (ref 26.0–34.0)
MCHC: 34 g/dL (ref 30.0–36.0)
MCV: 123.3 fL — ABNORMAL HIGH (ref 80.0–100.0)
Platelets: 134 10*3/uL — ABNORMAL LOW (ref 150–400)
RBC: 2.1 MIL/uL — ABNORMAL LOW (ref 4.22–5.81)
RDW: 15.3 % (ref 11.5–15.5)
WBC: 13.2 10*3/uL — ABNORMAL HIGH (ref 4.0–10.5)
nRBC: 0 % (ref 0.0–0.2)

## 2023-07-11 MED ORDER — METOPROLOL TARTRATE 5 MG/5ML IV SOLN
2.5000 mg | Freq: Four times a day (QID) | INTRAVENOUS | Status: DC | PRN
  Filled 2023-07-11: qty 5

## 2023-07-11 NOTE — Consult Note (Signed)
 Chief Complaint: Patient was seen in consultation today for presacral fluid collection.  Referring Physician(s): Riley, Herlene, MD  Supervising Physician: Joshua Riley  Patient Status: [facility] - In-pt  History of Present Illness: Joshua Riley is a 88 y.o. 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 He had been seen by general surgery as an outpatient earlier that day and underwent left groin I&D and packing, he was then directed to the ED for admission for IV antibiotics, blood cultures and evaluation of pre-sacral fluid collection. He underwent CT abd/pelvis w/contrast which showed:  1. Chronic irregular thick walled presacral space 5.7 x 4.5 cm fluid collection, extending into the left ischio-anal fat, slightly increased in size since 06/11/2023 CT. 2. Partially visualized surgical grafts arising from the left common femoral artery and vein and extending inferiorly in the subcutaneous ventral left thigh with stable small 1.9 x 1.6 cm fluid collection at the anterior origin of the surgical grafts. 3. 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 No evidence of acute small bowel complication. 4. Cholelithiasis. 5. Left percutaneous pigtail nephrostomy tube with tip in place in the lower left renal collecting system. No hydronephrosis. 6. Stable relatively collapsed bladder with irregular diffuse bladder wall thickening, nonspecific. Mild prostatomegaly. 7.  Aortic Atherosclerosis (ICD10-I70.0).  He was admitted and IR was initially consulted for  purulent drainage from the left nephrostomy tube. This was evaluated by our service on 07/09/23 and recommendation was made for treatment of UTI. Last PCN exchange 06/25/23 so repeat exchange was not recommended. UA from 1 year ago showing similar results as current UA, making acute infection less likely. ID was consulted who plans to follow up urine culture.  [facility] has been following left groin abscess as well as pre-sacral fluid collection of undetermined etiology (felt possibly due to sinus tract infection from surgery, urinoma, retained rectal mucosa, anastomotic leak). Per chart review patient has been receiving majority of his care at [facility] and he had a drain placed within this collection in August of this year that became dislodged at some point and was not replaced. Culture from the aspirate showed ESBL E.coli and strep intermedius and he was treated with IV antibiotics. The collection was monitored and noted to be increasing in October of this year. [facility] aspirated this collection but was unable to place a drain, the cultures yielded no growth. He was followed by [facility]/general surgery who felt there were no further surgical options. IR has been consulted for aspiration/possible drain placement of this fluid collection.   Patient seen at bedside - he is aware of the request for repeat aspiration/drain placement, states he is well aware of what the procedure entails and is agreeable to proceed. Asking about his urine being infected - discussed unlikely given previous UAs compared to most recent UA + lack of symptoms, however will defer to ID/primary team which he is comfortable with. Discussed procedure with patient's daughter Joshua Riley via phone today per his request. They are both in agreement to proceed.  Past Medical History:  Diagnosis Date   A-fib (HCC)    Anemia  Arthritis    Cancer (HCC)    Basal cell   COVID-19    2021   Dyspnea    Dysrhythmia    Afib-controlled on eliquis    ESRD (end  stage renal disease) (HCC) 10/22/2021   Glaucoma 11/18/2021   History of DVT (deep vein thrombosis)    Hydronephrosis    managed wtih a PCN   Idiopathic neuropathy 10/22/2021   lyrica     Ileostomy in place [facility])    Obstructive uropathy    With chronic left nephrostomy   Old retinal detachment, total or subtotal    Orthostatic hypotension 10/22/2021   Sleep apnea    does not need a machine   Stroke (HCC)    Ulcerative colitis (HCC)    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    Past Surgical History:  Procedure Laterality Date   BASAL CELL CARCINOMA EXCISION     10/23   COLON SURGERY     creation of j pouch     and subsequent takedown of j pouch   EYE SURGERY     IR NEPHROSTOMY EXCHANGE LEFT  12/10/2021   IR NEPHROSTOMY EXCHANGE LEFT  04/22/2022   IR NEPHROSTOMY EXCHANGE LEFT  07/29/2022   IR NEPHROSTOMY EXCHANGE LEFT  10/28/2022   IR NEPHROSTOMY EXCHANGE LEFT  02/05/2023   IR NEPHROSTOMY EXCHANGE LEFT  05/07/2023   IR NEPHROSTOMY EXCHANGE LEFT  06/25/2023   LEFT HEART CATH AND CORONARY ANGIOGRAPHY N/A 07/22/2022   Procedure: LEFT HEART CATH AND CORONARY ANGIOGRAPHY;  Surgeon: Jordan, Peter M, MD;  Location: [facility] INVASIVE CV LAB;  Service: Cardiovascular;  Laterality: N/A;   REVISION OF ARTERIOVENOUS GORETEX GRAFT Left 05/06/2022   Procedure: REDO LEFT THIGH ARTERIOVENOUS 4-7 MM GORETEX GRAFT;  Surgeon: Eliza Lonni RAMAN, MD;  Location: [facility] OR;  Service: Vascular;  Laterality: Left;   SMALL INTESTINE SURGERY     TOTAL COLECTOMY      Allergies: Cephalosporins, Ciprofloxacin, and Baclofen  Medications: Prior to Admission medications   Medication Sig Start Date End Date Taking? Authorizing Provider  acetaminophen  (TYLENOL ) 500 MG tablet Take 1,000 mg by mouth as needed for mild pain (pain score 1-3).   Yes [provider]  albuterol  (PROVENTIL ) (2.5 MG/3ML) 0.083% nebulizer solution Take 2.5 mg by nebulization 2 (two) times daily. 06/17/23  Yes  [provider]  BIOTIN PO Take 1 capsule by mouth daily.   Yes [provider]  budesonide  (PULMICORT ) 0.5 MG/2ML nebulizer solution TAKE 2 ML (0.5 MG TOTAL) BY NEBULIZATION TWICE A DAY 06/29/23  Yes Hope Almarie ORN, NP  Cyanocobalamin  (B-12) 1000 MCG SUBL Place 1 tablet under the tongue daily at 6 (six) AM. 04/08/23  Yes Jesus Bernardino MATSU, MD  dorzolamide  (TRUSOPT ) 2 % ophthalmic solution Place 1 drop into both eyes 2 (two) times daily. 06/17/23  Yes [provider]  ELIQUIS  2.5 MG TABS tablet Take 1 tablet (2.5 mg total) by mouth 2 (two) times daily. 08/07/22  Yes Hobart Powell BRAVO, MD  latanoprost  (XALATAN ) 0.005 % ophthalmic solution Place 1 drop into the right eye at bedtime.   Yes [provider]  midodrine  (PROAMATINE ) 10 MG tablet Take 1 tablet (10 mg total) by mouth 3 (three) times daily. Takes 5 mg plus 10mg  for 15 mg total three times a day- started by nephrology in [place]  08/07/22  Yes Pemberton, Powell BRAVO, MD  midodrine  (PROAMATINE ) 5 MG tablet TAKE 1 TABLET 3 TIMES DAILY WITH MEALS. TAKES 5 MG PLUS  10MG  FOR 15 MG TOTAL THREE TIMES A DAY- STARTED BY NEPHROLOGY IN [place]  10/20/22  Yes Hunter, Garnette KIDD, MD  Omega-3 Fatty Acids (OMEGA-3 FISH OIL) 500 MG CAPS Take 1 capsule by mouth daily.   Yes [provider]  riboflavin (VITAMIN B-2) 100 MG TABS tablet Take 100 mg by mouth daily.   Yes [provider]  sertraline  (ZOLOFT ) 25 MG tablet Take 1 tablet (25 mg total) by mouth daily. Patient taking differently: Take 25 mg by mouth at bedtime. 04/08/23  Yes Jesus Bernardino MATSU, MD  sevelamer  carbonate (RENVELA ) 800 MG tablet Take 2,400 mg by mouth 3 (three) times daily with meals.   Yes [provider]  timolol  (TIMOPTIC ) 0.5 % ophthalmic solution Place 1 drop into both eyes daily.   Yes [provider]  traMADol  (ULTRAM ) 50 MG tablet Take 1 tablet (50 mg total) by mouth every 8 (eight) hours as needed (prn pain). 06/26/23   Yes Hudnell, Corean, NP  WIXELA INHUB 250-50 MCG/ACT AEPB Inhale 1 puff into the lungs 2 (two) times daily. 07/04/23  Yes [provider]  ramelteon  (ROZEREM ) 8 MG tablet TAKE 1 TABLET BY MOUTH AT BEDTIME. 07/09/23   Katrinka Garnette KIDD, MD     Family History  Problem Relation Age of Onset   Stroke Mother    Cancer Father    Esophageal cancer Brother     Social History   Socioeconomic History   Marital status: Widowed    Spouse name: Not on file   Number of children: Not on file   Years of education: Not on file   Highest education level: Not on file  Occupational History   Not on file  Tobacco Use   Smoking status: Former    Current packs/day: 0.00    Average packs/day: 2.0 packs/day for 6.0 years (12.0 ttl pk-yrs)    Types: Cigarettes    Start date: 7    Quit date: 20    Years since quitting: 40.0    Passive exposure: Never   Smokeless tobacco: Never  Vaping Use   Vaping status: Never Used  Substance and Sexual Activity   Alcohol use: Yes    Alcohol/week: 5.0 standard drinks of alcohol    Types: 5 Shots of liquor per week    Comment: socially   Drug use: Never   Sexual activity: Not Currently  Other Topics Concern   Not on file  Social History Narrative   Widowed- lost wife of 13 years to pancreatic cancer- previously married 43 years. Son in [place] and daughter helping in [place]. 4 grandkids.    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       Hobbies: dinner with family- occasional martini   Social Drivers of Health   Financial Resource Strain: Low Risk  (05/27/2022)   Overall Financial Resource Strain (CARDIA)    Difficulty of Paying Living Expenses: Not hard at all  Food Insecurity: No Food Insecurity (07/08/2023)   Hunger Vital Sign    Worried About Running Out of Food in the Last Year: Never true    Ran Out of Food in the Last Year: Never true  Transportation Needs: No Transportation Needs  (07/08/2023)   PRAPARE - Administrator, Civil Service (Medical): No    Lack of Transportation (Non-Medical): No  Physical Activity: Inactive (05/27/2022)   Exercise Vital Sign    Days of  Exercise per Week: 0 days    Minutes of Exercise per Session: 0 min  Stress: No Stress Concern Present (05/27/2022)   [employer] of Occupational Health - Occupational Stress Questionnaire    Feeling of Stress : Not at all  Social Connections: Moderately Integrated (07/08/2023)   Social Connection and Isolation Panel [NHANES]    Frequency of Communication with Friends and Family: More than three times a week    Frequency of Social Gatherings with Friends and Family: Once a week    Attends Religious Services: More than 4 times per year    Active Member of [employer] or Organizations: Yes    Attends Banker Meetings: More than 4 times per year    Marital Status: Widowed     Review of Systems: A 12 point ROS discussed and pertinent positives are indicated in the HPI above.  All other systems are negative.  Review of Systems  Constitutional:  Negative for chills and fever.  Respiratory:  Negative for cough and shortness of breath.   Cardiovascular:  Negative for chest pain.  Gastrointestinal:  Positive for anal bleeding (ongoing for several months per patient; unchanged today). Negative for abdominal pain, diarrhea, nausea and vomiting.  Musculoskeletal:  Negative for back pain.  Neurological:  Negative for dizziness.    Vital Signs: BP 94/80   Pulse 64   Temp 97.6 F (36.4 C) (Oral)   Resp 18   Ht 5' 5 (1.651 m)   Wt 145 lb 4.5 oz (65.9 kg)   SpO2 99%   BMI 24.18 kg/m   Physical Exam Vitals and nursing note reviewed.  Constitutional:      General: He is not in acute distress. HENT:     Head: Normocephalic.     Mouth/Throat:     Mouth: Mucous membranes are moist.     Pharynx: Oropharynx is clear. No oropharyngeal exudate or posterior oropharyngeal erythema.   Cardiovascular:     Rate and Rhythm: Normal rate and regular rhythm.  Pulmonary:     Effort: Pulmonary effort is normal.     Breath sounds: Normal breath sounds.  Abdominal:     General: There is no distension.     Palpations: Abdomen is soft.     Tenderness: There is no abdominal tenderness.  Genitourinary:    Comments: (+) left PCN with small amount of thick white output in gravity bag and tubing Skin:    General: Skin is warm and dry.  Neurological:     Mental Status: He is alert and oriented to person, place, and time.  Psychiatric:        Mood and Affect: Mood normal.        Behavior: Behavior normal.        Thought Content: Thought content normal.        Judgment: Judgment normal.      MD Evaluation Airway: WNL Heart: WNL Abdomen: WNL Chest/ Lungs: WNL ASA  Classification: 3 Mallampati/Airway Score: Two   Imaging: CT ABDOMEN PELVIS W CONTRAST Result Date: 07/07/2023 CLINICAL DATA:  Groin abscess.  Dyspnea. EXAM: CT ABDOMEN AND PELVIS WITH CONTRAST TECHNIQUE: Multidetector CT imaging of the abdomen and pelvis was performed using the standard protocol following bolus administration of intravenous contrast. RADIATION DOSE REDUCTION: This exam was performed according to the departmental dose-optimization program which includes automated exposure control, adjustment of the mA and/or kV according to patient size and/or use of iterative reconstruction technique. CONTRAST:  75mL OMNIPAQUE  IOHEXOL  350 MG/ML SOLN COMPARISON:  06/11/2023 CT abdomen/pelvis. FINDINGS: Lower chest: No acute abnormality at the lung bases. Hepatobiliary: Normal liver size. No liver mass. Cholelithiasis. No gallbladder distention. No definite gallbladder wall thickening. No pericholecystic fluid. No biliary ductal dilatation. Pancreas: Generalized pancreatic atrophy with no discrete mass or significant duct dilation, unchanged since baseline 11/17/2021 CT. Spleen: Normal size. No mass. Adrenals/Urinary  Tract: Normal adrenals. Severe bilateral renal atrophy, unchanged. Simple 1.4 cm posterior upper right renal cyst and exophytic simple 1.5 cm interpolar left renal cyst with additional subcentimeter hypodense left renal cortical lesions that are too small to characterize, unchanged, for which no follow-up imaging is recommended. No hydronephrosis. Left percutaneous pigtail nephrostomy tube with tip in the lower left renal collecting system. Stable relatively collapsed bladder with irregular diffuse bladder wall thickening. Stomach/Bowel: Normal non-distended stomach. 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 No dilated or thick-walled small bowel loops. No pneumatosis. Chronic irregular thick walled presacral space 5.7 x 4.5 cm fluid collection (series 3/image 64), extending into the left ischio-anal fat, previously 4.6 x 4.0 cm on 06/11/2023 CT, slightly increased. Vascular/Lymphatic: Atherosclerotic nonaneurysmal abdominal aorta. Patent portal, splenic, hepatic and renal veins. No pathologically enlarged lymph nodes in the abdomen or pelvis. Reproductive: Stable mild prostatomegaly. 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 Other: No pneumoperitoneum. No ascites. Partially visualized surgical grafts arising from the left common femoral artery and vein and extending inferiorly in the subcutaneous ventral left thigh with stable small 1.9 x 1.6 cm fluid collection at the anterior origin of the surgical grafts (series 3/image 73). Musculoskeletal: No aggressive appearing focal osseous lesions. Moderate thoracolumbar spondylosis. 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 IMPRESSION: 1. Chronic irregular thick walled presacral space 5.7 x 4.5 cm fluid collection, extending into the left ischio-anal fat, slightly increased in  size since 06/11/2023 CT. 2. Partially visualized surgical grafts arising from the left common femoral artery and vein and extending inferiorly in the subcutaneous ventral left thigh with stable small 1.9 x 1.6 cm fluid collection at the anterior origin of the surgical grafts. 3. 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 No evidence of acute small bowel complication. 4. Cholelithiasis. 5. Left percutaneous pigtail nephrostomy tube with tip in place in the lower left renal collecting system. No hydronephrosis. 6. Stable relatively collapsed bladder with irregular diffuse bladder wall thickening, nonspecific. Mild prostatomegaly. 7.  Aortic Atherosclerosis (ICD10-I70.0). Electronically Signed   By: Selinda DELENA Blue M.D.   On: 07/07/2023 18:25   DG Chest Portable 1 View Result Date: 07/07/2023 CLINICAL DATA:  Dyspnea EXAM: PORTABLE CHEST 1 VIEW COMPARISON:  04/10/2023 chest radiograph. FINDINGS: Vascular stent overlies the right subclavian region. Stable cardiomediastinal silhouette with normal heart size. No pneumothorax. No pleural effusion. No pulmonary edema. No acute consolidative airspace disease. Chronic streaky mild left lung base scarring versus atelectasis. IMPRESSION: No active disease. Chronic streaky mild left lung base scarring versus atelectasis. Electronically Signed   By: Selinda DELENA Blue M.D.   On: 07/07/2023 18:12   IR NEPHROSTOMY EXCHANGE LEFT Result Date: 06/25/2023 INDICATION: 88 year old male with history of chronic indwelling left nephrostomy tube presenting for routine check and exchange. EXAM: FLUOROSCOPIC GUIDED left SIDED NEPHROSTOMY CATHETER EXCHANGE COMPARISON:  05/07/2023, 06/11/2023 CONTRAST:  A total of 10 mL Isovue -300 administered was administered into the collecting  system FLUOROSCOPY TIME:  Sixteen mGy COMPLICATIONS: None immediate. TECHNIQUE: Informed written  consent was obtained from the patient after a discussion of the risks, benefits and alternatives to treatment. Questions regarding the procedure were encouraged and answered. A timeout was performed prior to the initiation of the procedure. The left flank and external portions of existing nephrostomy catheter was prepped and draped in the usual sterile fashion. A sterile drape was applied covering the operative field. Maximum barrier sterile technique with sterile gowns and gloves were used for the procedure. A timeout was performed prior to the initiation of the procedure. A pre procedural spot fluoroscopic image was obtained. A small amount of contrast was injected via the existing nephrostomy catheter demonstrating retraction of the indwelling nephrostomy into the Peri renal tissues. Adjacent to the indwelling nephrostomy, a 5 French angled tip catheter and Glidewire were directed into the collecting system. The wire was exchanged for an Amplatz wire over which a new 8.5 French Dawson Mueller drainage catheter was placed. The pigtail portion was coiled and locked within the renal pelvis. Limited contrast injection confirmed appropriate positioning within the renal pelvis and a post exchange fluoroscopic image was obtained. The catheter was secured to the skin with an interrupted suture and reconnected to a gravity bag. A dressing was placed. The patient tolerated the procedure well without immediate postprocedural complication. FINDINGS: The existing nephrostomy catheter is retracted into the perinephric tissues. After successful fluoroscopic guided recanalization a new 8.5French Letha Lacks nephrostomy catheter is coiled and locked within the renal pelvis. IMPRESSION: Successful fluoroscopic guided tract recanalization and placement of left 8.5 French Dawson Mueller percutaneous nephrostomy catheter. Ester Sides, MD [facility] Electronically Signed    By: Ester Sides M.D.   On: 06/25/2023 21:12   CT ABDOMEN PELVIS W CONTRAST Result Date: 06/18/2023 CLINICAL DATA:  Abdominal pain EXAM: CT ABDOMEN AND PELVIS WITH CONTRAST TECHNIQUE: Multidetector CT imaging of the abdomen and pelvis was performed using the standard protocol following bolus administration of intravenous contrast. RADIATION DOSE REDUCTION: This exam was performed according to the departmental dose-optimization program which includes automated exposure control, adjustment of the mA and/or kV according to patient size and/or use of iterative reconstruction technique. CONTRAST:  100mL ISOVUE -300 IOPAMIDOL  (ISOVUE -300) INJECTION 61% COMPARISON:  02/04/2022 FINDINGS: Lower chest: No acute abnormality. Hepatobiliary: 2 cm gallstone within the gallbladder. Low-density throughout the liver compatible with fatty infiltration. No focal abnormality or biliary ductal dilatation. Pancreas: No focal abnormality or ductal dilatation. Spleen: No focal abnormality.  Normal size. Adrenals/Urinary Tract: Adrenal glands unremarkable. Severely atrophic kidneys bilaterally. Left nephrostomy tube it is retracted from prior study and overlies the renal parenchyma rather than the collecting system. No hydronephrosis. The left and posterior bladder walls are again thickened, similar to prior study. Stomach/Bowel: Prior total colectomy. Right lower quadrant ostomy. No bowel obstruction. Stomach and small bowel decompressed, unremarkable. Vascular/Lymphatic: Aortic atherosclerosis. No evidence of aneurysm or adenopathy. Reproductive: Prostate enlargement Other: New fluid collection noted in the pelvis/presacral space measuring 4.6 x 4.0 cm in the area of previously seen presacral soft tissue thickening. No free fluid or free air. Musculoskeletal: Bilateral L5 pars defects. Advanced degenerative disc and facet disease at L5-S1 with 8 mm anterolisthesis of L5 on S1, stable. IMPRESSION: Prior total colectomy. Right lower  quadrant ostomy without complicating feature. 4.6 x 4.0 fluid collection in the presacral space. This could reflect postoperative seroma or abscess. Continued wall thickening along the left and posterior bladder walls. Cannot exclude malignancy.  Left nephrostomy catheter has pulled out slightly and now overlies the posterior left renal parenchyma rather than the collecting system. No hydronephrosis. Hepatic steatosis. Cholelithiasis. Aortic atherosclerosis. Electronically Signed   By: Franky Crease M.D.   On: 06/18/2023 22:16    Labs:  CBC: Recent Labs    07/09/23 0314 07/10/23 0429 07/10/23 1120 07/11/23 0310  WBC 13.0* 13.2* 14.0* 13.2*  HGB 9.1* 9.5* 9.3* 8.8*  HCT 27.4* 27.7* 27.6* 25.9*  PLT 140* 167 160 134*    COAGS: No results for input(s): INR, APTT in the last 8760 hours.  BMP: Recent Labs    07/09/23 0314 07/10/23 0429 07/10/23 1120 07/11/23 0310  NA 130* 127* 128* 134*  K 4.6 5.6* 5.3* 4.8  CL 90* 89* 87* 92*  CO2 23 19* 25 28  GLUCOSE 85 73 105* 86  BUN 48* 63* 62* 26*  CALCIUM  8.7* 8.4* 8.5* 8.9  CREATININE 8.03* 9.79* 9.89* 5.80*  GFRNONAA 6* 5* 5* 9*    LIVER FUNCTION TESTS: Recent Labs    03/27/23 1430 04/08/23 1503 07/07/23 1407 07/08/23 0527 07/09/23 0314 07/10/23 0429 07/10/23 1120 07/11/23 0310  BILITOT 0.5 0.5 0.7 0.7  --   --   --   --   AST 40* 23 22 17   --   --   --   --   ALT 32 16 22 18   --   --   --   --   ALKPHOS 78 80 85 71  --   --   --   --   PROT 7.2 7.3 7.1 6.0*  --   --   --   --   ALBUMIN  3.9 4.2 3.0* 2.6* 2.7* 2.5* 2.4* 3.1*    TUMOR MARKERS: No results for input(s): AFPTM, CEA, CA199, CHROMGRNA in the last 8760 hours.  Assessment and Plan:  88 y/o M with complicated medical history as per HPI with chronic presacral abscess/fluid collection s/p aspiration and drainage 02/2023 by [facility] with cultures (+) for ESBL e.coli and streptococcus intermedius. This drain was dislodged/removed at some point and not  replaced. The collection was monitored and in October of this year repeat aspiration was performed by [facility] and cultures yielded no growth. He has been admitted to [facility] for left groin abscess s/p I&D by [facility], sepsis and presacral abscess. IR has been consulted for aspiration/possible drain placement within the presacral abscess.  Patient history and imaging reviewed by Dr. Alona who approves procedure to be done Monday 1/6 in CT, likely via transgluteal approach.  Plan: - NPO at midnight on 1/6, may have sips with meds - Continue to hold Eliquis , recommend heparin  SQ (1 dose hold) or heparin  gtt (~4 hour hold) if needed - CBC/INR AM 1/6 - Vanc 1g x 1 to be given in IR on day of procedure for prophylaxis (signed and held) - IR will call for patient when ready. Patient and his daughter are aware that he will be worked into the schedule between outpatient procedures and emergencies.  Risks and benefits discussed with the patient including bleeding, infection, damage to adjacent structures, bowel perforation/fistula connection, and sepsis.  All of the patient's questions were answered, patient is agreeable to proceed.  Consent signed and in chart.  Thank you for this interesting consult.  I greatly enjoyed meeting Alroy Portela and look forward to participating in their care.  A copy of this report was sent to the requesting provider on this date.  Electronically Signed: Clotilda DELENA Hesselbach,  PA-C 07/11/2023, 10:39 AM   I spent a total of 55 Miinutes in face to face in clinical consultation, greater than 50% of which was counseling/coordinating care for presacral fluid collection.

## 2023-07-11 NOTE — Progress Notes (Signed)
 Patient heart rate elevated when sitting up to eat, in afib for a few minutes a symptomatic, now tachy, notified MD Samtani, who placed new order see chart.

## 2023-07-11 NOTE — Progress Notes (Signed)
 HOSPITALIST                ROUNDING                 NOTE Joshua Riley FMW:[id number]  DOB: 10-08-1935  DOA: 07/07/2023  PCP: Katrinka Garnette KIDD, MD  07/11/2023,8:42 AM   LOS: 4 days      Code Status: Full code From: Home  current Dispo: Likely home     88 year old ESRD MWF right femoral TDC-C/P hypotension on midodrine   Dialyzes via left thigh AVG placed [facility] 09/25/2021 advised by Dr. 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 surgeon felt to be 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 R PCN exchange 12/19/202  Seen in office Dr. Ann 12/30 for left groin abscess-history of having this for 3 weeks-treated 12/20 doxycycline  100 twice daily X 10 days-had severe pain-was given tramadol --- attempt at I&D on  12/31 patient was asked to be admitted by general surgery  Procedure CT ABD pelvis chronic irregular thick-walled presacral space 5.7X 4.5 cm fluid collection increased from 12/5-partially visualized surgical grafts L CFA with small fluid collection-total proctocolectomy end ileostomy no evidence of acute small bowel complication-left pigtail nephrostomy tube in lower renal collecting system no hydronephrosis  Event ID consulted 1/3    Plan  Sepsis on admission 2/2 infected groin abscess Currently Zyvox  in addition to Zosyn -narrow once cultures from general surgery clinic biopsy 12/31 are back--general surgery aware of need surgery-->wet-to-dry dressings no overt surgical plan--first tylenol --- resumed tramadol  50 every 8 as needed severe pain  Presacral space 5.7X 4.5 cm fluid collection extending into the left ischial anal fat slightly increased from 06/11/2023 Discussed 07/10/2023 Dr. Stevie with general surgery who has discussed with IR-IR guided drain to be placed--timing uncertain--not a good surgical candidate overall  Await cultures from this collection being drained prior to further narrowing of antibiotics  ESRD MWF via TDC complicated by hypotension Continues on midodrine  15 x 3 times daily meals--this is actually his usual home dose Continue Renvela  2.4 3 times daily meals Not dialyzing for 3 hours was only able to tolerate 2 and half  A-fib CHADVASC >3 on Eliquis  Eliquis  held for drain-start subcu heparin   Not on any rate control keep on monitors overnight and reassess as needed  Depression continue Zoloft  25 daily  Obstructive uropathy with chronic left PCN Secondary to multiple strictures Patient tells me  that the area is cloudy and turbid-I will get a sample as this is not his normal-he had the PCN changed about 2 weeks ago and gets it changed every 4 weeks and he will need to continue this schedule  Ulcerative colitis in total colectomy 2011 with ileostomy--ileal  pouch anal anastomosis surgery 2012 --prior SBO 11/23/2021-see above Rectal bleed 2/2 likely gluteal cleft fistula This is chronic --- his hemoglobin is stable in the 8-9 range-some discussion about anocopy although unclear if there is a surgical or procedural fix for this-we will ask GI to comment--- he has never had evaluation of this and has been told to follow-up with wound care previously He does have intermittent amounts of bleeding and is on Eliquis  which probably wants at least a look to see if there is something that can be fixed  Previous DVT remotely  CAD--no ischemic workup previously   DVT prophylaxis: Eliquis   Status is: Inpatient Remains inpatient appropriate because:   Requires further inpatient management      Subjective:  States intertriginous region where the surgical wound this has been draining some pus-I do not see any on pressure over the area and feel he has some intertriginous dampness that needs to be Dried Also complains of an area superiorly and laterally in the upper groin which has a bump that has been draining-again I am not able to appreciate anything specific He has intermitten blood from his rectum-happens every time he stands  Objective + exam Vitals:   07/10/23 2310 07/11/23 0130 07/11/23 0457 07/11/23 0755  BP: (!) 83/48 (!) 115/51 (!) 94/44 94/80  Pulse: 64 64    Resp: 17 18 17 18   Temp: 98 F (36.7 C)  97.8 F (36.6 C) 97.6 F (36.4 C)  TempSrc: Oral  Oral Oral  SpO2: 99% 99%    Weight:      Height:       Filed Weights   07/07/23 1536 07/10/23 0500  Weight: 67.1 kg 65.9 kg    Examination:  Coherent awake alert no distress Chest clear Abdomen soft no rebound ostomy in right lower quadrant Wound is about 7 cm long 4 cm to 5 cm deep but the edges and margins look clean I am not able to express any pus out of it he has a slightly indurated boil in the upper groin but I do not appreciate any pus I examined his anus today I do not  see any other opening I only see the anus and there is some dark stained blood on the Chux pad Power is 5/5  Data Reviewed: reviewed   CBC    Component Value Date/Time   WBC 13.2 (H) 07/11/2023 0310   RBC 2.10 (L) 07/11/2023 0310   HGB 8.8 (L) 07/11/2023 0310   HGB 10.9 (L) 04/10/2023 1541   HCT 25.9 (L) 07/11/2023 0310   HCT 33.2 (L) 04/10/2023 1541   PLT 134 (L) 07/11/2023 0310   PLT 207 04/10/2023 1541   MCV 123.3 (H) 07/11/2023 0310   MCV 119 (H) 04/10/2023 1541   MCH 41.9 (H) 07/11/2023 0310   MCHC 34.0 07/11/2023 0310   RDW 15.3 07/11/2023 0310   RDW 13.2 04/10/2023 1541   LYMPHSABS 0.8 07/08/2023 0527   LYMPHSABS 0.8 04/10/2023 1541   MONOABS 0.9 07/08/2023 0527   EOSABS 0.1 07/08/2023 0527   EOSABS 0.3 04/10/2023 1541   BASOSABS 0.0 07/08/2023 0527   BASOSABS 0.1 04/10/2023 1541      Latest Ref Rng &  Units 07/11/2023    3:10 AM 07/10/2023   11:20 AM 07/10/2023    4:29 AM  CMP  Glucose 70 - 99 mg/dL 86  894  73   BUN 8 - 23 mg/dL 26  62  63   Creatinine 0.61 - 1.24 mg/dL 4.19  0.10  0.20   Sodium 135 - 145 mmol/L 134  128  127   Potassium 3.5 - 5.1 mmol/L 4.8  5.3  5.6   Chloride 98 - 111 mmol/L 92  87  89   CO2 22 - 32 mmol/L 28  25  19    Calcium  8.9 - 10.3 mg/dL 8.9  8.5  8.4     Scheduled Meds:  Chlorhexidine  Gluconate Cloth  6 each Topical Q0600   linezolid   600 mg Oral Q12H   midodrine   10 mg Oral TID WC   mometasone -formoterol   2 puff Inhalation BID   sertraline   25 mg Oral QHS   sevelamer  carbonate  2,400 mg Oral TID WC   Continuous Infusions:  Time 33  Jai-Gurmukh Annalisse Minkoff, MD  [facility]

## 2023-07-11 NOTE — Progress Notes (Signed)
 Scenario discussed with the GI Dr. Rollin who feels that although he may be evaluated rectum,, this is not something that has a clear-cut or readily fixable issue even if something operable can be found-we will continue with the prior plan as per Dr. Leigh for wound care unless general surgery here feels otherwise.  He does require the Eliquis  for prevention of stroke from A-fib--- at some point he may be a candidate for right atrial appendage closure?

## 2023-07-11 NOTE — Plan of Care (Signed)

## 2023-07-11 NOTE — Progress Notes (Signed)
 Patient ID: Joshua Riley, male   DOB: [birth year], 88 y.o.   MRN: [id number] S: No new complaints.  O:BP 94/80   Pulse 64   Temp 97.6 F (36.4 C) (Oral)   Resp 18   Ht 5' 5 (1.651 m)   Wt 65.9 kg   SpO2 99%   BMI 24.18 kg/m   Intake/Output Summary (Last 24 hours) at 07/11/2023 1024 Last data filed at 07/11/2023 0500 Gross per 24 hour  Intake 717.79 ml  Output 875 ml  Net -157.21 ml   Intake/Output: I/O last 3 completed shifts: In: 1017.8 [P.O.:787; I.V.:170.8; Other:10; IV Piggyback:50] Out: 1675 [Stool:1675]  Intake/Output this shift:  No intake/output data recorded. Weight change:  Gen:NAD CVS: RRR Resp:CTA Abd: +BS, soft, NT/ND, ostomy RLQ Ext: no edema, left thigh AVG +T/B  Recent Labs  Lab 07/07/23 1407 07/08/23 0527 07/09/23 0314 07/10/23 0429 07/10/23 1120 07/11/23 0310  NA 135 131* 130* 127* 128* 134*  K 4.5 4.5 4.6 5.6* 5.3* 4.8  CL 94* 93* 90* 89* 87* 92*  CO2 27 24 23  19* 25 28  GLUCOSE 114* 88 85 73 105* 86  BUN 30* 38* 48* 63* 62* 26*  CREATININE 5.39* 6.42* 8.03* 9.79* 9.89* 5.80*  ALBUMIN  3.0* 2.6* 2.7* 2.5* 2.4* 3.1*  CALCIUM  9.4 8.8* 8.7* 8.4* 8.5* 8.9  PHOS  --  6.2* 7.6* 8.2* 8.3* 5.8*  AST 22 17  --   --   --   --   ALT 22 18  --   --   --   --    Liver Function Tests: Recent Labs  Lab 07/07/23 1407 07/08/23 0527 07/09/23 0314 07/10/23 0429 07/10/23 1120 07/11/23 0310  AST 22 17  --   --   --   --   ALT 22 18  --   --   --   --   ALKPHOS 85 71  --   --   --   --   BILITOT 0.7 0.7  --   --   --   --   PROT 7.1 6.0*  --   --   --   --   ALBUMIN  3.0* 2.6*   < > 2.5* 2.4* 3.1*   < > = values in this interval not displayed.   No results for input(s): LIPASE, AMYLASE in the last 168 hours. No results for input(s): AMMONIA in the last 168 hours. CBC: Recent Labs  Lab 07/07/23 1407 07/08/23 0527 07/09/23 0314 07/10/23 0429 07/10/23 1120 07/11/23 0310  WBC 15.8* 11.8* 13.0* 13.2* 14.0* 13.2*  NEUTROABS 14.1* 9.8*  --    --   --   --   HGB 10.0* 8.6* 9.1* 9.5* 9.3* 8.8*  HCT 30.8* 26.3* 27.4* 27.7* 27.6* 25.9*  MCV 126.7* 126.4* 123.4* 122.0* 122.7* 123.3*  PLT 149* 128* 140* 167 160 134*   Cardiac Enzymes: No results for input(s): CKTOTAL, CKMB, CKMBINDEX, TROPONINI in the last 168 hours. CBG: Recent Labs  Lab 07/09/23 1229  GLUCAP 96    Iron Studies: No results for input(s): IRON, TIBC, TRANSFERRIN, FERRITIN in the last 72 hours. Studies/Results: No results found.  Chlorhexidine  Gluconate Cloth  6 each Topical Q0600   linezolid   600 mg Oral Q12H   midodrine   10 mg Oral TID WC   mometasone -formoterol   2 puff Inhalation BID   sertraline   25 mg Oral QHS   sevelamer  carbonate  2,400 mg Oral TID WC    BMET    Component Value Date/Time  NA 134 (L) 07/11/2023 0310   NA 138 04/10/2023 1541   K 4.8 07/11/2023 0310   CL 92 (L) 07/11/2023 0310   CO2 28 07/11/2023 0310   GLUCOSE 86 07/11/2023 0310   BUN 26 (H) 07/11/2023 0310   BUN 19 04/10/2023 1541   CREATININE 5.80 (H) 07/11/2023 0310   CALCIUM  8.9 07/11/2023 0310   GFRNONAA 9 (L) 07/11/2023 0310   CBC    Component Value Date/Time   WBC 13.2 (H) 07/11/2023 0310   RBC 2.10 (L) 07/11/2023 0310   HGB 8.8 (L) 07/11/2023 0310   HGB 10.9 (L) 04/10/2023 1541   HCT 25.9 (L) 07/11/2023 0310   HCT 33.2 (L) 04/10/2023 1541   PLT 134 (L) 07/11/2023 0310   PLT 207 04/10/2023 1541   MCV 123.3 (H) 07/11/2023 0310   MCV 119 (H) 04/10/2023 1541   MCH 41.9 (H) 07/11/2023 0310   MCHC 34.0 07/11/2023 0310   RDW 15.3 07/11/2023 0310   RDW 13.2 04/10/2023 1541   LYMPHSABS 0.8 07/08/2023 0527   LYMPHSABS 0.8 04/10/2023 1541   MONOABS 0.9 07/08/2023 0527   EOSABS 0.1 07/08/2023 0527   EOSABS 0.3 04/10/2023 1541   BASOSABS 0.0 07/08/2023 0527   BASOSABS 0.1 04/10/2023 1541    Dialysis Orders: Center: [facility]  on MWF . 180NRe 2 hr 45 min BFR 350 DFR Auto 1.5 EDW 68.5kg 2K 2.5Ca AVG 15g Heparin  5000 unit bolus Mircera 120mcg IV q 4  weeks- last dose 06/24/23 Calcitriol  0.25 mcg PO q HD   Assessment/Plan:  Sepsis d/t groin abscess: S/p I&D, on antibiotics per PMD/general surgery team  Presacral abscess - appears to have grown since discovered at [facility].  To have CT guided fluid drain by IR and follow cultures.   ESRD:  On MWF schedule, and tolerated HD well yesterday.  Patient notes AVG has had prolonged bleeding post HD lately so will hold heparin  for now.  Hypertension/volume: BP soft/stable, on midodrine . Has been leaving below his EDW, possible there is a component of fluid overload contributing to his dyspnea. UFG 1.5L as tolerated, does not tolerate high UF due to hypotension  Anemia: Hgb 8.8, likely needs more frequent ESA dosing, will order aranesp   Metabolic bone disease: Calcium  controlled, phos elevated. Resume VDRA and home binders Hyperkalemia - should resolve with HD.  Nutrition:  Alb 2.7, will need renal diet/protein supplements Chronic obstructive uropathy: With chronic left nephrostomy tube, reportedly some concern for infection but already on abx as abovehronic obstructive uropathy  Fairy RONAL Sellar, MD [facility]

## 2023-07-12 DIAGNOSIS — L02214 Cutaneous abscess of groin: Secondary | ICD-10-CM | POA: Diagnosis not present

## 2023-07-12 DIAGNOSIS — K6819 Other retroperitoneal abscess: Secondary | ICD-10-CM | POA: Diagnosis not present

## 2023-07-12 DIAGNOSIS — N186 End stage renal disease: Secondary | ICD-10-CM | POA: Diagnosis not present

## 2023-07-12 DIAGNOSIS — Z992 Dependence on renal dialysis: Secondary | ICD-10-CM | POA: Diagnosis not present

## 2023-07-12 LAB — CULTURE, BLOOD (ROUTINE X 2)
Culture: NO GROWTH
Culture: NO GROWTH
Special Requests: ADEQUATE

## 2023-07-12 LAB — BASIC METABOLIC PANEL
Anion gap: 15 (ref 5–15)
BUN: 45 mg/dL — ABNORMAL HIGH (ref 8–23)
CO2: 24 mmol/L (ref 22–32)
Calcium: 9 mg/dL (ref 8.9–10.3)
Chloride: 93 mmol/L — ABNORMAL LOW (ref 98–111)
Creatinine, Ser: 8.13 mg/dL — ABNORMAL HIGH (ref 0.61–1.24)
GFR, Estimated: 6 mL/min — ABNORMAL LOW (ref >60)
Glucose, Bld: 91 mg/dL (ref 70–99)
Potassium: 5.2 mmol/L — ABNORMAL HIGH (ref 3.5–5.1)
Sodium: 132 mmol/L — ABNORMAL LOW (ref 135–145)

## 2023-07-12 LAB — MAGNESIUM: Magnesium: 1.7 mg/dL (ref 1.7–2.4)

## 2023-07-12 MED ORDER — HEPARIN (PORCINE) 25000 UT/250ML-% IV SOLN
1000.0000 [IU]/h | INTRAVENOUS | Status: DC
  Administered 2023-07-12: 800 [IU]/h via INTRAVENOUS
  Filled 2023-07-12: qty 250

## 2023-07-12 MED ORDER — SODIUM ZIRCONIUM CYCLOSILICATE 10 G PO PACK
10.0000 g | PACK | Freq: Once | ORAL | Status: AC
  Administered 2023-07-12: 10 g via ORAL
  Filled 2023-07-12: qty 1

## 2023-07-12 MED ORDER — AMIODARONE HCL IN DEXTROSE 360-4.14 MG/200ML-% IV SOLN
60.0000 mg/h | INTRAVENOUS | Status: AC
  Administered 2023-07-12 (×2): 60 mg/h via INTRAVENOUS
  Filled 2023-07-12: qty 200

## 2023-07-12 MED ORDER — MAGNESIUM OXIDE -MG SUPPLEMENT 400 (240 MG) MG PO TABS
400.0000 mg | ORAL_TABLET | Freq: Two times a day (BID) | ORAL | Status: DC
  Administered 2023-07-12 – 2023-07-20 (×17): 400 mg via ORAL
  Filled 2023-07-12 (×17): qty 1

## 2023-07-12 MED ORDER — AMIODARONE HCL IN DEXTROSE 360-4.14 MG/200ML-% IV SOLN
30.0000 mg/h | INTRAVENOUS | Status: DC
  Administered 2023-07-13: 30 mg/h via INTRAVENOUS
  Filled 2023-07-12 (×2): qty 200

## 2023-07-12 MED ORDER — AMIODARONE HCL 200 MG PO TABS: 200.0000 mg | ORAL_TABLET | Freq: Two times a day (BID) | ORAL | Status: DC

## 2023-07-12 MED ORDER — GERHARDT'S BUTT CREAM
TOPICAL_CREAM | Freq: Two times a day (BID) | CUTANEOUS | Status: DC
  Administered 2023-07-16 – 2023-07-18 (×2): 1 via TOPICAL
  Filled 2023-07-12: qty 60

## 2023-07-12 MED ORDER — AMIODARONE HCL 200 MG PO TABS: 400.0000 mg | ORAL_TABLET | Freq: Two times a day (BID) | ORAL | Status: DC

## 2023-07-12 NOTE — Progress Notes (Signed)
 Subjective: No new complaints   Antibiotics:  Anti-infectives (From admission, onward)    Start     Dose/Rate Route Frequency Ordered Stop   07/10/23 2200  linezolid  (ZYVOX ) tablet 600 mg        600 mg Oral Every 12 hours 07/10/23 0952     07/08/23 2200  piperacillin -tazobactam (ZOSYN ) IVPB 2.25 g  Status:  Discontinued        2.25 g 100 mL/hr over 30 Minutes Intravenous Every 8 hours 07/08/23 2124 07/10/23 0952   07/08/23 0600  piperacillin -tazobactam (ZOSYN ) IVPB 3.375 g  Status:  Discontinued        3.375 g 12.5 mL/hr over 240 Minutes Intravenous Every 12 hours 07/08/23 0518 07/08/23 2124   07/08/23 0200  linezolid  (ZYVOX ) IVPB 600 mg  Status:  Discontinued        600 mg 300 mL/hr over 60 Minutes Intravenous Every 12 hours 07/07/23 2117 07/10/23 0952   07/07/23 2300  piperacillin -tazobactam (ZOSYN ) IVPB 2.25 g  Status:  Discontinued        2.25 g 100 mL/hr over 30 Minutes Intravenous Every 8 hours 07/07/23 2121 07/08/23 0517   07/07/23 1415  piperacillin -tazobactam (ZOSYN ) IVPB 3.375 g        3.375 g 100 mL/hr over 30 Minutes Intravenous  Once 07/07/23 1404 07/07/23 1555   07/07/23 1415  clindamycin  (CLEOCIN ) IVPB 900 mg  Status:  Discontinued        900 mg 100 mL/hr over 30 Minutes Intravenous  Once 07/07/23 1404 07/07/23 1414   07/07/23 1415  linezolid  (ZYVOX ) IVPB 600 mg        600 mg 300 mL/hr over 60 Minutes Intravenous STAT 07/07/23 1414 07/07/23 1911       Medications: Scheduled Meds:  Chlorhexidine  Gluconate Cloth  6 each Topical Q0600   linezolid   600 mg Oral Q12H   magnesium  oxide  400 mg Oral BID   midodrine   10 mg Oral TID WC   mometasone -formoterol   2 puff Inhalation BID   sertraline   25 mg Oral QHS   sevelamer  carbonate  2,400 mg Oral TID WC   Continuous Infusions: PRN Meds:.acetaminophen  **OR** acetaminophen , albuterol , melatonin, metoprolol  tartrate, ondansetron  (ZOFRAN ) IV, mouth rinse, oxyCODONE -acetaminophen ,  traMADol     Objective: Weight change:   Intake/Output Summary (Last 24 hours) at 07/12/2023 1429 Last data filed at 07/12/2023 0932 Gross per 24 hour  Intake 10 ml  Output 920 ml  Net -910 ml   Blood pressure 111/60, pulse 78, temperature 97.6 F (36.4 C), temperature source Oral, resp. rate 13, height 5' 5 (1.651 m), weight 65.3 kg, SpO2 98%. Temp:  [97.4 F (36.3 C)-97.6 F (36.4 C)] 97.6 F (36.4 C) (01/05 1123) Pulse Rate:  [45-98] 78 (01/05 1121) Resp:  [13-21] 13 (01/05 0808) BP: (82-141)/(48-81) 111/60 (01/05 1123) SpO2:  [88 %-100 %] 98 % (01/05 1121) Weight:  [65.3 kg] 65.3 kg (01/05 0636)  Physical Exam: Physical Exam Constitutional:      Appearance: He is well-developed.  HENT:     Head: Normocephalic and atraumatic.  Eyes:     Conjunctiva/sclera: Conjunctivae normal.  Cardiovascular:     Rate and Rhythm: Normal rate and regular rhythm.  Pulmonary:     Effort: Pulmonary effort is normal. No respiratory distress.     Breath sounds: No wheezing.  Abdominal:     General: There is no distension.     Palpations: Abdomen is soft.  Musculoskeletal:  General: Normal range of motion.     Cervical back: Normal range of motion and neck supple.  Skin:    General: Skin is warm and dry.     Findings: No erythema or rash.  Neurological:     General: No focal deficit present.     Mental Status: He is alert and oriented to person, place, and time.  Psychiatric:        Mood and Affect: Mood normal.        Behavior: Behavior normal.        Thought Content: Thought content normal.        Judgment: Judgment normal.      CBC:    BMET Recent Labs    07/11/23 1702 07/12/23 0927  NA 132* 132*  K 5.5* 5.2*  CL 90* 93*  CO2 25 24  GLUCOSE 90 91  BUN 34* 45*  CREATININE 6.72* 8.13*  CALCIUM  9.2 9.0     Liver Panel  Recent Labs    07/11/23 0310 07/11/23 1702  ALBUMIN  3.1* 3.5       Sedimentation Rate No results for input(s): ESRSEDRATE in  the last 72 hours. C-Reactive Protein No results for input(s): CRP in the last 72 hours.  Micro Results: Recent Results (from the past 720 hours)  Blood culture (routine x 2)     Status: None   Collection Time: 07/07/23  2:07 PM   Specimen: BLOOD  Result Value Ref Range Status   Specimen Description BLOOD RIGHT ANTECUBITAL  Final   Special Requests   Final    BOTTLES DRAWN AEROBIC AND ANAEROBIC Blood Culture adequate volume   Culture   Final    NO GROWTH 5 DAYS Performed at [facility], [address] [place], [place] [address]    Report Status 07/12/2023 FINAL  Final  Blood culture (routine x 2)     Status: None   Collection Time: 07/07/23  2:08 PM   Specimen: BLOOD LEFT FOREARM  Result Value Ref Range Status   Specimen Description BLOOD LEFT FOREARM  Final   Special Requests   Final    BOTTLES DRAWN AEROBIC ONLY Blood Culture results may not be optimal due to an inadequate volume of blood received in culture bottles   Culture   Final    NO GROWTH 5 DAYS Performed at [facility], [address], [place], [place] [address]    Report Status 07/12/2023 FINAL  Final  Resp panel by RT-PCR (RSV, Flu A&B, Covid) Anterior Nasal Swab     Status: None   Collection Time: 07/07/23  3:46 PM   Specimen: Anterior Nasal Swab  Result Value Ref Range Status   SARS Coronavirus 2 by RT PCR NEGATIVE NEGATIVE Final   Influenza A by PCR NEGATIVE NEGATIVE Final   Influenza B by PCR NEGATIVE NEGATIVE Final    Comment: (NOTE) The Xpert Xpress SARS-CoV-2/FLU/RSV plus assay is intended as an aid in the diagnosis of influenza from Nasopharyngeal swab specimens and should not be used as a sole basis for treatment. Nasal washings and aspirates are unacceptable for Xpert Xpress SARS-CoV-2/FLU/RSV testing.  Fact Sheet for Patients: bloggercourse.com  Fact Sheet for Healthcare Providers: seriousbroker.it  This test is not yet approved  or cleared by the [place]  FDA and has been authorized for detection and/or diagnosis of SARS-CoV-2 by FDA under an Emergency Use Authorization (EUA). This EUA will remain in effect (meaning this test can be used) for the duration of  the COVID-19 declaration under Section 564(b)(1) of the Act, 21 U.S.C. section 360bbb-3(b)(1), unless the authorization is terminated or revoked.     Resp Syncytial Virus by PCR NEGATIVE NEGATIVE Final    Comment: (NOTE) Fact Sheet for Patients: bloggercourse.com  Fact Sheet for Healthcare Providers: seriousbroker.it  This test is not yet approved or cleared by the [place]  FDA and has been authorized for detection and/or diagnosis of SARS-CoV-2 by FDA under an Emergency Use Authorization (EUA). This EUA will remain in effect (meaning this test can be used) for the duration of the COVID-19 declaration under Section 564(b)(1) of the Act, 21 U.S.C. section 360bbb-3(b)(1), unless the authorization is terminated or revoked.  Performed at [facility], [address] [place], [place] [address]     Studies/Results: No results found.    Assessment/Plan:  INTERVAL HISTORY: Patient to have IR guided drain of presacral fluid collection tomorrow   Principal Problem:   Abscess of groin, left Active Problems:   End-stage renal disease on hemodialysis (HCC)   Severe sepsis (HCC)   Depression   Leukocytosis   Paroxysmal atrial fibrillation (HCC)   History of COPD   Chronic diastolic CHF (congestive heart failure) (HCC)   History of anemia due to chronic kidney disease    Joshua Riley is a 88 y.o. 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  When IR aspirated this area on the first occasion at [facility] and ESBL E. coli and Streptococcus intermedius isolated and he was prescribed ertapenem  Monday Wednesday Friday at the infusion center here in [place].  Unfortunately his fluid collection increased in size antibiotics were stopped and repeat aspirate with negative cultures.  He has now been admitted for groin abscess status post I&D in general surgery clinic.  This presacral fluid collection which is likely an abscess has increased in size compared to scan done in early December which had increased in size compared to scan done at [facility] in October.  #1 Groin abscess:  I have narrowed him to Zyvox  alone.  He has cultures that he been taken at [facility] surgery and we need to touch base with them to see if anything grew.  2.  Presacral abscess:  Undergoing IR guided aspirate  Cultures may not be esp revealing given abx he has been on   As mentioned before I suspect this is due to some type of leak from his prior surgery at an site anastomotic site and that he will continue to leak fluid into this area in talking to the patient the bedside today he is clear with me that the fluid drains out of his rectum and not out of a separate fistula it happens whenever he stands up  3 ESRD on HD       LOS: 5 days   Joshua Riley 07/12/2023, 2:29 PM

## 2023-07-12 NOTE — Progress Notes (Addendum)
 HOSPITALIST                ROUNDING                 NOTE Joshua Riley FMW:[id number]  DOB: 1936/01/12  DOA: 07/07/2023  PCP: Katrinka Garnette KIDD, MD  07/12/2023,2:41 PM   LOS: 5 days      Code Status: Full code From: Home  current Dispo: Likely home   88 year old ESRD MWF right femoral TDC-C/P hypotension on midodrine   Dialyzes via left thigh AVG placed [facility] 09/25/2021 advised by Dr. 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 R PCN exchange 12/19/202  Seen in office Dr. Ann 12/30 for left groin abscess-history of having this for 3 weeks-treated 12/20 doxycycline  100 twice daily X 10 days-had severe pain-was given tramadol --- attempt at  I&D on 12/31 patient was asked to be admitted by general surgery  Procedure CT ABD pelvis chronic irregular thick-walled presacral space 5.7X 4.5 cm fluid collection increased from 12/5-partially visualized surgical grafts L CFA with small fluid collection-total proctocolectomy end ileostomy no evidence of acute small bowel complication-left pigtail nephrostomy tube in lower renal collecting system no hydronephrosis  Event 1/3 ID consulted  1/4 GI curbsided---not a surgical candidate.  Unclear anything would be offered on eval 1/5 paroxysmal afib --Cardiology curbsided  Plan  Sepsis on admission 2/2 infected groin abscess Currently Zyvox  ifor groin abscess per ID surgery-->wet-to-dry dressings no overt surgical plan--first tylenol --- resumed tramadol  50 every 8 as needed severe pain  Presacral space 5.7X 4.5 cm fluid collection extending into the left ischial anal fat slightly increased from 06/11/2023 Discussed 07/10/2023 Dr. Stevie Speedy surg]who has discussed with IR-IR guided drain to be placed--timing uncertain--not a good surgical candidate overall  Await cultures from this collection being drained prior to further abx coverage--may end up with sterile cult as is on current abx  ESRD MWF via TDC complicated by hypotension Continues on midodrine  15 x 3 times daily meals--this is actually his usual home dose Continue Renvela  2.4 3 times daily meals Not dialyzing for 3 hours was only able to tolerate 2 and half  A-fib CHADVASC >3 on Eliquis  Eliquis  held for now Has had paroxsysmal afib with rate sin 130's---prn metoprolol  IV ordered D/w Dr. Fleet with amio--recs IV without bolus and titration off when able-Mag1.7 -replace  400 bid  Depression continue Zoloft  25 daily  Obstructive uropathy with chronic left PCN Secondary to multiple strictures Patient tells me that the area is cloudy and turbid-PCN changed about 2 weeks ago --unclear if this represent infection  Ulcerative  colitis in total colectomy 2011 with ileostomy--ileal pouch anal anastomosis surgery 2012 --prior SBO 11/23/2021-see above Rectal bleed 2/2 likely gluteal cleft fistula This is chronic --- his hemoglobin is stable in the 8-9 range-some discussion about anocopy although unclear if there is a surgical or procedural fix for this--discussed with Dr. Rollin GI--not a clear procedural operative candidate He does have intermittent amounts of bleeding and is on Eliquis   Previous DVT remotely  CAD-- LHC on 07/22/22 which showed no significant CAD and normal LVEDP.   Called daughter 07/11/22   DVT prophylaxis: Eliquis   Status is: Inpatient Remains inpatient appropriate because:   Requires further inpatient management      Subjective:  No distress about the same Wounds not reviewed today. Some tachycardia late yesterday pm-informed this afternoon also has HR ~ 130  Objective + exam Vitals:   07/12/23 0636 07/12/23 0808 07/12/23 1121 07/12/23 1123  BP:   (!) 88/52 111/60  Pulse:  71 78   Resp:  13    Temp:   97.6 F (36.4 C) 97.6 F (36.4 C)  TempSrc:   Oral Oral  SpO2:  99% 98%   Weight: 65.3 kg     Height:       Filed Weights   07/07/23 1536 07/10/23 0500 07/12/23 0636  Weight: 67.1 kg 65.9 kg 65.3 kg    Examination:  Coherent awake alert no distress Chest clear Abdomen soft no rebound ostomy in right lower quadrant Wound not seen today  Data Reviewed: reviewed   CBC    Component Value Date/Time   WBC 13.2 (H) 07/11/2023 0310   RBC 2.10 (L) 07/11/2023 0310   HGB 8.8 (L) 07/11/2023 0310   HGB 10.9 (L) 04/10/2023 1541   HCT 25.9 (L) 07/11/2023 0310   HCT 33.2 (L) 04/10/2023 1541   PLT 134 (L) 07/11/2023 0310   PLT 207 04/10/2023 1541   MCV 123.3 (H) 07/11/2023 0310   MCV 119 (H) 04/10/2023 1541   MCH 41.9 (H) 07/11/2023 0310   MCHC 34.0 07/11/2023 0310   RDW 15.3 07/11/2023 0310   RDW 13.2 04/10/2023 1541   LYMPHSABS 0.8 07/08/2023 0527   LYMPHSABS 0.8  04/10/2023 1541   MONOABS 0.9 07/08/2023 0527   EOSABS 0.1 07/08/2023 0527   EOSABS 0.3 04/10/2023 1541   BASOSABS 0.0 07/08/2023 0527   BASOSABS 0.1 04/10/2023 1541      Latest Ref Rng & Units 07/12/2023    9:27 AM 07/11/2023    5:02 PM 07/11/2023    3:10 AM  CMP  Glucose 70 - 99 mg/dL 91  90  86   BUN 8 - 23 mg/dL 45  34  26   Creatinine 0.61 - 1.24 mg/dL 1.86  3.27  4.19   Sodium 135 - 145 mmol/L 132  132  134   Potassium 3.5 - 5.1 mmol/L 5.2  5.5  4.8   Chloride 98 - 111 mmol/L 93  90  92   CO2 22 - 32 mmol/L 24  25  28    Calcium  8.9 - 10.3 mg/dL 9.0  9.2  8.9     Scheduled Meds:  Chlorhexidine  Gluconate Cloth  6 each Topical Q0600   linezolid   600 mg Oral Q12H   magnesium  oxide  400 mg Oral BID   midodrine   10 mg Oral TID WC   mometasone -formoterol   2 puff Inhalation BID   sertraline   25 mg Oral QHS   sevelamer  carbonate  2,400 mg Oral TID WC   Continuous Infusions:  Time 33  Jai-Gurmukh Riyana Biel, MD  [facility]

## 2023-07-12 NOTE — Progress Notes (Signed)
 PHARMACY - ANTICOAGULATION CONSULT NOTE  Pharmacy Consult for Heparin  while Eliquis  on hold Indication: atrial fibrillation  Allergies  Allergen Reactions   Cephalosporins Rash   Ciprofloxacin Itching and Rash   Baclofen Other (See Comments)    Altered mental status, after accidental overdose      Patient Measurements: Height: 5' 5 (165.1 cm) Weight: 65.3 kg (144 lb) IBW/kg (Calculated) : 61.5 Heparin  Dosing Weight: total weight  Vital Signs: Temp: 97.6 F (36.4 C) (01/05 1547) Temp Source: Oral (01/05 1547) BP: 104/64 (01/05 1547) Pulse Rate: 78 (01/05 1121)  Labs: Recent Labs    07/10/23 0429 07/10/23 1120 07/11/23 0310 07/11/23 1702 07/12/23 0927  HGB 9.5* 9.3* 8.8*  --   --   HCT 27.7* 27.6* 25.9*  --   --   PLT 167 160 134*  --   --   CREATININE 9.79* 9.89* 5.80* 6.72* 8.13*    Estimated Creatinine Clearance: 5.8 mL/min (A) (by C-G formula based on SCr of 8.13 mg/dL (H)).   Medical History: 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     Ileostomy in [facility])    Obstructive uropathy    With chronic left nephrostomy   Old retinal detachment, total or subtotal    Orthostatic hypotension 10/22/2021   Sleep apnea    does not need a machine   Stroke (HCC)    Ulcerative colitis (HCC)    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    Medications:  Scheduled:   Chlorhexidine  Gluconate Cloth  6 each Topical Q0600   Gerhardt's butt cream   Topical BID   linezolid   600 mg Oral Q12H   magnesium  oxide  400 mg Oral BID   midodrine   10 mg Oral TID WC   mometasone -formoterol   2 puff Inhalation BID    sertraline   25 mg Oral QHS   sevelamer  carbonate  2,400 mg Oral TID WC   Infusions:   amiodarone  60 mg/hr (07/12/23 1624)   amiodarone      heparin      PRN: acetaminophen  **OR** acetaminophen , albuterol , melatonin, metoprolol  tartrate, ondansetron  (ZOFRAN ) IV, mouth rinse, oxyCODONE -acetaminophen , traMADol   Assessment: 88 yo male on chronic eliquis  for hx afib which is currently on hold for planned IR aspiration/possible drain placement within the presacral abscess on 1/6. Pharmacy consulted to dose IV heparin  without a bolus. Hgb 8.8, Plts 134. Bleeding currently reported from rectal wound, but MD orders to proceed with heparin  drip at this time due to uncontrolled afib.  Last Eliquis  dose on 1/3 at 08:23 - will use aPTT to titrate heparin  for now.  Goal of Therapy:  Heparin  level 0.3-0.7 units/ml aPTT 66-102 seconds Monitor platelets by anticoagulation protocol: Yes   Plan:  No heparin  bolus per MD Begin IV heparin  infusion 800 units/hr Check aPTT and heparin  level 8hr after starting infusion F/u timing of IR procedure on 1/6, instructions are to hold heparin  drip 1-2hr prior  Rocky Slade, PharmD, BCPS 07/12/2023,6:22 PM  Please check AMION for all [facility] Pharmacy phone  numbers After 10:00 PM, call Main Pharmacy [phone]

## 2023-07-12 NOTE — Progress Notes (Addendum)
 Patient ID: Sven Pinheiro, male   DOB: Jan 25, 1936, 88 y.o.   MRN: [id number] S: No new complaints. O:BP (!) 114/55 (BP Location: Left Arm)   Pulse 71   Temp (!) 97.5 F (36.4 C) (Oral)   Resp 13   Ht 5' 5 (1.651 m)   Wt 65.3 kg   SpO2 99%   BMI 23.96 kg/m   Intake/Output Summary (Last 24 hours) at 07/12/2023 0907 Last data filed at 07/12/2023 9362 Gross per 24 hour  Intake 10 ml  Output 820 ml  Net -810 ml   Intake/Output: I/O last 3 completed shifts: In: 557 [P.O.:537; Other:20] Out: 1545 [Urine:20; Stool:1525]  Intake/Output this shift:  No intake/output data recorded. Weight change:  Gen: NAD CVS: RRR Resp:CTA Abd: +BS, soft, NT/ND Ext: no edema, left thigh AVG +T/B  Recent Labs  Lab 07/07/23 1407 07/08/23 0527 07/09/23 0314 07/10/23 0429 07/10/23 1120 07/11/23 0310 07/11/23 1702  NA 135 131* 130* 127* 128* 134* 132*  K 4.5 4.5 4.6 5.6* 5.3* 4.8 5.5*  CL 94* 93* 90* 89* 87* 92* 90*  CO2 27 24 23  19* 25 28 25   GLUCOSE 114* 88 85 73 105* 86 90  BUN 30* 38* 48* 63* 62* 26* 34*  CREATININE 5.39* 6.42* 8.03* 9.79* 9.89* 5.80* 6.72*  ALBUMIN  3.0* 2.6* 2.7* 2.5* 2.4* 3.1* 3.5  CALCIUM  9.4 8.8* 8.7* 8.4* 8.5* 8.9 9.2  PHOS  --  6.2* 7.6* 8.2* 8.3* 5.8* 6.1*  AST 22 17  --   --   --   --   --   ALT 22 18  --   --   --   --   --    Liver Function Tests: Recent Labs  Lab 07/07/23 1407 07/08/23 0527 07/09/23 0314 07/10/23 1120 07/11/23 0310 07/11/23 1702  AST 22 17  --   --   --   --   ALT 22 18  --   --   --   --   ALKPHOS 85 71  --   --   --   --   BILITOT 0.7 0.7  --   --   --   --   PROT 7.1 6.0*  --   --   --   --   ALBUMIN  3.0* 2.6*   < > 2.4* 3.1* 3.5   < > = values in this interval not displayed.   No results for input(s): LIPASE, AMYLASE in the last 168 hours. No results for input(s): AMMONIA in the last 168 hours. CBC: Recent Labs  Lab 07/07/23 1407 07/08/23 0527 07/09/23 0314 07/10/23 0429 07/10/23 1120 07/11/23 0310  WBC 15.8*  11.8* 13.0* 13.2* 14.0* 13.2*  NEUTROABS 14.1* 9.8*  --   --   --   --   HGB 10.0* 8.6* 9.1* 9.5* 9.3* 8.8*  HCT 30.8* 26.3* 27.4* 27.7* 27.6* 25.9*  MCV 126.7* 126.4* 123.4* 122.0* 122.7* 123.3*  PLT 149* 128* 140* 167 160 134*   Cardiac Enzymes: No results for input(s): CKTOTAL, CKMB, CKMBINDEX, TROPONINI in the last 168 hours. CBG: Recent Labs  Lab 07/09/23 1229  GLUCAP 96    Iron Studies: No results for input(s): IRON, TIBC, TRANSFERRIN, FERRITIN in the last 72 hours. Studies/Results: No results found.  Chlorhexidine  Gluconate Cloth  6 each Topical Q0600   linezolid   600 mg Oral Q12H   magnesium  oxide  400 mg Oral BID   midodrine   10 mg Oral TID WC   mometasone -formoterol   2 puff Inhalation  BID   sertraline   25 mg Oral QHS   sevelamer  carbonate  2,400 mg Oral TID WC    BMET    Component Value Date/Time   NA 132 (L) 07/11/2023 1702   NA 138 04/10/2023 1541   K 5.5 (H) 07/11/2023 1702   CL 90 (L) 07/11/2023 1702   CO2 25 07/11/2023 1702   GLUCOSE 90 07/11/2023 1702   BUN 34 (H) 07/11/2023 1702   BUN 19 04/10/2023 1541   CREATININE 6.72 (H) 07/11/2023 1702   CALCIUM  9.2 07/11/2023 1702   GFRNONAA 7 (L) 07/11/2023 1702   CBC    Component Value Date/Time   WBC 13.2 (H) 07/11/2023 0310   RBC 2.10 (L) 07/11/2023 0310   HGB 8.8 (L) 07/11/2023 0310   HGB 10.9 (L) 04/10/2023 1541   HCT 25.9 (L) 07/11/2023 0310   HCT 33.2 (L) 04/10/2023 1541   PLT 134 (L) 07/11/2023 0310   PLT 207 04/10/2023 1541   MCV 123.3 (H) 07/11/2023 0310   MCV 119 (H) 04/10/2023 1541   MCH 41.9 (H) 07/11/2023 0310   MCHC 34.0 07/11/2023 0310   RDW 15.3 07/11/2023 0310   RDW 13.2 04/10/2023 1541   LYMPHSABS 0.8 07/08/2023 0527   LYMPHSABS 0.8 04/10/2023 1541   MONOABS 0.9 07/08/2023 0527   EOSABS 0.1 07/08/2023 0527   EOSABS 0.3 04/10/2023 1541   BASOSABS 0.0 07/08/2023 0527   BASOSABS 0.1 04/10/2023 1541    Dialysis Orders: Center: [facility]  on MWF . 180NRe 2 hr 45  min BFR 350 DFR Auto 1.5 EDW 68.5kg 2K 2.5Ca AVG 15g Heparin  5000 unit bolus Mircera 120mcg IV q 4 weeks- last dose 06/24/23 Calcitriol  0.25 mcg PO q HD   Assessment/Plan:  Sepsis d/t groin abscess: S/p I&D, on antibiotics per PMD/general surgery team  Presacral abscess - appears to have grown since discovered at [facility].  To have CT guided fluid drain by IR and follow cultures.   Hyperkalemia - K 5.5 yesterday despite HD. Will recheck this morning.  Possible hemolysis.  Repeat K was 5.2, will dose with Lokelma  10 gm x 1 today and for HD tomorrow.  ESRD:  On MWF schedule, and tolerated HD well yesterday.  Patient notes AVG has had prolonged bleeding post HD lately so will hold heparin  for now.  Hypertension/volume: BP soft/stable, on midodrine . Has been leaving below his EDW, possible there is a component of fluid overload contributing to his dyspnea. UFG 1.5L as tolerated, does not tolerate high UF due to hypotension  Anemia: Hgb 8.8, likely needs more frequent ESA dosing, will order aranesp   Metabolic bone disease: Calcium  controlled, phos elevated. Resume VDRA and home binders Hyperkalemia - should resolve with HD.  Nutrition:  Alb 2.7, will need renal diet/protein supplements Chronic obstructive uropathy: With chronic left nephrostomy tube, reportedly some concern for infection but already on abx as abovehronic obstructive uropathy  Fairy RONAL Sellar, MD [facility]

## 2023-07-13 ENCOUNTER — Inpatient Hospital Stay (HOSPITAL_COMMUNITY): Payer: Medicare Other

## 2023-07-13 DIAGNOSIS — L02214 Cutaneous abscess of groin: Secondary | ICD-10-CM | POA: Diagnosis not present

## 2023-07-13 LAB — RENAL FUNCTION PANEL
Albumin: 2.9 g/dL — ABNORMAL LOW (ref 3.5–5.0)
Anion gap: 16 — ABNORMAL HIGH (ref 5–15)
BUN: 57 mg/dL — ABNORMAL HIGH (ref 8–23)
CO2: 26 mmol/L (ref 22–32)
Calcium: 9 mg/dL (ref 8.9–10.3)
Chloride: 90 mmol/L — ABNORMAL LOW (ref 98–111)
Creatinine, Ser: 9.3 mg/dL — ABNORMAL HIGH (ref 0.61–1.24)
GFR, Estimated: 5 mL/min — ABNORMAL LOW (ref >60)
Glucose, Bld: 99 mg/dL (ref 70–99)
Phosphorus: 7 mg/dL — ABNORMAL HIGH (ref 2.5–4.6)
Potassium: 5.7 mmol/L — ABNORMAL HIGH (ref 3.5–5.1)
Sodium: 132 mmol/L — ABNORMAL LOW (ref 135–145)

## 2023-07-13 LAB — CBC
HCT: 27 % — ABNORMAL LOW (ref 39.0–52.0)
Hemoglobin: 9 g/dL — ABNORMAL LOW (ref 13.0–17.0)
MCH: 41.3 pg — ABNORMAL HIGH (ref 26.0–34.0)
MCHC: 33.3 g/dL (ref 30.0–36.0)
MCV: 123.9 fL — ABNORMAL HIGH (ref 80.0–100.0)
Platelets: 143 10*3/uL — ABNORMAL LOW (ref 150–400)
RBC: 2.18 MIL/uL — ABNORMAL LOW (ref 4.22–5.81)
RDW: 14.9 % (ref 11.5–15.5)
WBC: 11.8 10*3/uL — ABNORMAL HIGH (ref 4.0–10.5)
nRBC: 0 % (ref 0.0–0.2)

## 2023-07-13 LAB — HEPATIC FUNCTION PANEL
ALT: 13 U/L (ref 0–44)
AST: 13 U/L — ABNORMAL LOW (ref 15–41)
Albumin: 2.9 g/dL — ABNORMAL LOW (ref 3.5–5.0)
Alkaline Phosphatase: 73 U/L (ref 38–126)
Bilirubin, Direct: <0.1 mg/dL (ref 0.0–0.2)
Total Bilirubin: 0.7 mg/dL (ref 0.0–1.2)
Total Protein: 6.4 g/dL — ABNORMAL LOW (ref 6.5–8.1)

## 2023-07-13 LAB — HEPARIN LEVEL (UNFRACTIONATED): Heparin Unfractionated: 0.18 [IU]/mL — ABNORMAL LOW (ref 0.30–0.70)

## 2023-07-13 LAB — APTT: aPTT: 44 s — ABNORMAL HIGH (ref 24–36)

## 2023-07-13 LAB — PROTIME-INR
INR: 1.1 (ref 0.8–1.2)
Prothrombin Time: 14.7 s (ref 11.4–15.2)

## 2023-07-13 LAB — GLUCOSE, CAPILLARY: Glucose-Capillary: 114 mg/dL — ABNORMAL HIGH (ref 70–99)

## 2023-07-13 MED ORDER — SODIUM CHLORIDE 0.9 % IV SOLN
1.0000 g | INTRAVENOUS | Status: DC
  Administered 2023-07-13 – 2023-07-20 (×7): 1 g via INTRAVENOUS
  Filled 2023-07-13 (×9): qty 20

## 2023-07-13 MED ORDER — PROSOURCE PLUS PO LIQD
30.0000 mL | Freq: Two times a day (BID) | ORAL | Status: DC
  Administered 2023-07-13 – 2023-07-20 (×10): 30 mL via ORAL
  Filled 2023-07-13 (×10): qty 30

## 2023-07-13 MED ORDER — FENTANYL CITRATE (PF) 100 MCG/2ML IJ SOLN
INTRAMUSCULAR | Status: AC | PRN
  Administered 2023-07-13 (×2): 50 ug via INTRAVENOUS

## 2023-07-13 MED ORDER — MIDAZOLAM HCL 2 MG/2ML IJ SOLN
INTRAMUSCULAR | Status: AC | PRN
  Administered 2023-07-13 (×2): 1 mg via INTRAVENOUS

## 2023-07-13 MED ORDER — LATANOPROST 0.005 % OP SOLN
1.0000 [drp] | Freq: Every day | OPHTHALMIC | Status: DC
  Administered 2023-07-13 – 2023-07-20 (×8): 1 [drp] via OPHTHALMIC
  Filled 2023-07-13: qty 2.5

## 2023-07-13 MED ORDER — AMIODARONE HCL 200 MG PO TABS
400.0000 mg | ORAL_TABLET | Freq: Two times a day (BID) | ORAL | Status: DC
  Administered 2023-07-13 – 2023-07-14 (×3): 400 mg via ORAL
  Filled 2023-07-13 (×3): qty 2

## 2023-07-13 MED ORDER — FENTANYL CITRATE (PF) 100 MCG/2ML IJ SOLN
INTRAMUSCULAR | Status: AC
  Filled 2023-07-13: qty 2

## 2023-07-13 MED ORDER — DARBEPOETIN ALFA 100 MCG/0.5ML IJ SOSY
100.0000 ug | PREFILLED_SYRINGE | INTRAMUSCULAR | Status: DC
  Administered 2023-07-14: 100 ug via SUBCUTANEOUS
  Filled 2023-07-13 (×2): qty 0.5

## 2023-07-13 MED ORDER — HEPARIN (PORCINE) 25000 UT/250ML-% IV SOLN
1000.0000 [IU]/h | INTRAVENOUS | Status: DC
  Administered 2023-07-13 – 2023-07-14 (×2): 1000 [IU]/h via INTRAVENOUS
  Filled 2023-07-13: qty 250

## 2023-07-13 MED ORDER — MIDAZOLAM HCL 2 MG/2ML IJ SOLN
INTRAMUSCULAR | Status: AC
Start: 2023-07-13 — End: ?
  Filled 2023-07-13: qty 2

## 2023-07-13 MED ORDER — DORZOLAMIDE HCL 2 % OP SOLN
1.0000 [drp] | Freq: Two times a day (BID) | OPHTHALMIC | Status: DC
  Administered 2023-07-13 – 2023-07-21 (×15): 1 [drp] via OPHTHALMIC
  Filled 2023-07-13: qty 10

## 2023-07-13 MED ORDER — LIDOCAINE HCL 1 % IJ SOLN
10.0000 mL | Freq: Once | INTRAMUSCULAR | Status: AC
  Administered 2023-07-13: 10 mL via INTRADERMAL
  Filled 2023-07-13: qty 10

## 2023-07-13 MED ORDER — TIMOLOL MALEATE 0.5 % OP SOLN
1.0000 [drp] | Freq: Every day | OPHTHALMIC | Status: DC
  Administered 2023-07-13 – 2023-07-21 (×8): 1 [drp] via OPHTHALMIC
  Filled 2023-07-13: qty 5

## 2023-07-13 MED ORDER — SODIUM CHLORIDE 0.9% FLUSH
5.0000 mL | Freq: Three times a day (TID) | INTRAVENOUS | Status: DC
  Administered 2023-07-13 – 2023-07-21 (×23): 5 mL

## 2023-07-13 NOTE — Care Management Important Message (Signed)
 Important Message  Patient Details  Name: Joshua Riley MRN: [id number] Date of Birth: 12-May-1936   Important Message Given:  Yes - Medicare IM     [name] 07/13/2023, 12:07 PM

## 2023-07-13 NOTE — Progress Notes (Signed)
 Patient received from IR, CCMD notified, call bell in reach

## 2023-07-13 NOTE — Progress Notes (Signed)
 Pharmacy Antibiotic Note  Joshua Riley is a 88 y.o. male admitted on 07/07/2023 with  groin abscess s/p I&D and presacral abscess .  Pharmacy has been consulted for Meropenem  dosing. Pt also currently on Zyvox . Pt is ESRD with HD on MWF.  Plan: Meropenem  1gm IV q24h Will f/u HD tolerance, micro data, and pt's clinical condition   Height: 5' 5 (165.1 cm) Weight: 66.6 kg (146 lb 13.2 oz) IBW/kg (Calculated) : 61.5  Temp (24hrs), Avg:97.5 F (36.4 C), Min:97.3 F (36.3 C), Max:97.8 F (36.6 C)  Recent Labs  Lab 07/07/23 1407 07/07/23 1624 07/07/23 2041 07/07/23 2317 07/08/23 0527 07/09/23 0314 07/10/23 0429 07/10/23 1120 07/11/23 0310 07/11/23 1702 07/12/23 0927 07/13/23 0658 07/13/23 0659  WBC 15.8*  --   --   --    < > 13.0* 13.2* 14.0* 13.2*  --   --  11.8*  --   CREATININE 5.39*  --   --   --    < > 8.03* 9.79* 9.89* 5.80* 6.72* 8.13*  --  9.30*  LATICACIDVEN 2.6* 2.8* 5.1* 1.5  --   --   --   --   --   --   --   --   --    < > = values in this interval not displayed.    Estimated Creatinine Clearance: 5.1 mL/min (A) (by C-G formula based on SCr of 9.3 mg/dL (H)).    Allergies  Allergen Reactions   Cephalosporins Rash   Ciprofloxacin Itching and Rash   Baclofen Other (See Comments)    Altered mental status, after accidental overdose      Antimicrobials this admission: Zyvox  12/31 >> Zosyn  12/31 > 1/3  Meropenem  1/6 >>  Microbiology results: 12/31 BCx: negative  Thank you for allowing pharmacy to be a part of this patient's care.  Vito Ralph, PharmD, BCPS Please see amion for complete clinical pharmacist phone list 07/13/2023 5:44 PM

## 2023-07-13 NOTE — Plan of Care (Signed)
  Problem: Clinical Measurements: Goal: Diagnostic test results will improve Outcome: Progressing   Problem: Clinical Measurements: Goal: Will remain free from infection Outcome: Progressing   Problem: Clinical Measurements: Goal: Ability to maintain clinical measurements within normal limits will improve Outcome: Progressing   Problem: Clinical Measurements: Goal: Respiratory complications will improve Outcome: Progressing   Problem: Coping: Goal: Level of anxiety will decrease Outcome: Progressing   Problem: Nutrition: Goal: Adequate nutrition will be maintained Outcome: Progressing   Problem: Safety: Goal: Ability to remain free from injury will improve Outcome: Progressing   Problem: Skin Integrity: Goal: Risk for impaired skin integrity will decrease Outcome: Progressing

## 2023-07-13 NOTE — Progress Notes (Addendum)
 PHARMACY - ANTICOAGULATION CONSULT NOTE  Pharmacy Consult for Heparin  while Eliquis  on hold Indication: atrial fibrillation  Allergies  Allergen Reactions   Cephalosporins Rash   Ciprofloxacin Itching and Rash   Baclofen Other (See Comments)    Altered mental status, after accidental overdose      Patient Measurements: Height: 5' 5 (165.1 cm) Weight: 66.6 kg (146 lb 13.2 oz) IBW/kg (Calculated) : 61.5 Heparin  Dosing Weight: total weight  Vital Signs: Temp: 97.8 F (36.6 C) (01/06 0340) Temp Source: Oral (01/06 0340) BP: 116/72 (01/06 0340) Pulse Rate: 77 (01/06 0340)  Labs: Recent Labs    07/10/23 1120 07/11/23 0310 07/11/23 1702 07/12/23 0927 07/13/23 0658 07/13/23 0659  HGB 9.3* 8.8*  --   --  9.0*  --   HCT 27.6* 25.9*  --   --  27.0*  --   PLT 160 134*  --   --  143*  --   APTT  --   --   --   --  44*  --   LABPROT  --   --   --   --  14.7  --   INR  --   --   --   --  1.1  --   HEPARINUNFRC  --   --   --   --  0.18*  --   CREATININE 9.89* 5.80* 6.72* 8.13*  --  9.30*    Estimated Creatinine Clearance: 5.1 mL/min (A) (by C-G formula based on SCr of 9.3 mg/dL (H)).   Medical History: 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     Ileostomy in place [facility])    Obstructive uropathy    With chronic left nephrostomy   Old retinal detachment, total or subtotal    Orthostatic hypotension 10/22/2021   Sleep apnea    does not need a machine   Stroke (HCC)    Ulcerative colitis (HCC)    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    Medications:  Scheduled:   Chlorhexidine  Gluconate Cloth  6 each  Topical Q0600   Gerhardt's butt cream   Topical BID   linezolid   600 mg Oral Q12H   magnesium  oxide  400 mg Oral BID   midodrine   10 mg Oral TID WC   mometasone -formoterol   2 puff Inhalation BID   sertraline   25 mg Oral QHS   sevelamer  carbonate  2,400 mg Oral TID WC   Infusions:   amiodarone  30 mg/hr (07/12/23 2157)   heparin  800 Units/hr (07/12/23 2033)   PRN: acetaminophen  **OR** acetaminophen , albuterol , melatonin, metoprolol  tartrate, ondansetron  (ZOFRAN ) IV, mouth rinse, oxyCODONE -acetaminophen , traMADol   Assessment: 88 yo male on chronic eliquis  for hx afib which is currently on hold for planned IR aspiration/possible drain placement within the presacral abscess on 1/6. Pharmacy consulted to dose IV heparin  without a bolus.   -heparin  level = 0.19 on 800 units/hr  Goal of Therapy:  Heparin  level= 0.3-0.7 Monitor platelets by anticoagulation protocol: Yes   Plan:  No heparin  bolus  per MD Increase heparin  to 1000 units/hr Plans for procedure today (hold heparin  1-2 hours prior) Will follow plans post procedure  Prentice Poisson, PharmD Clinical Pharmacist **Pharmacist phone directory can now be found on [email or web address] ([email or web address]).  Listed under [facility].  Addendum -he is now s/p IR procedure. Heparin  to restart in 4 hrs  Plan -Restart heparin  1000 units/hr at 5:30pm -heparin  level in 8 hrs  Prentice Poisson, PharmD Clinical Pharmacist **Pharmacist phone directory can now be found on [email or web address] ([email or web address]).  Listed under [facility].

## 2023-07-13 NOTE — Progress Notes (Addendum)
 Chief Complaint/Subjective: No new complaints, except pain at his sacrum from wound there  Objective: Vital signs in last 24 hours: Temp:  [97.3 F (36.3 C)-97.8 F (36.6 C)] 97.8 F (36.6 C) (01/06 0831) Pulse Rate:  [62-78] 73 (01/06 0904) Resp:  [12-20] 18 (01/06 0904) BP: (88-129)/(39-72) 108/52 (01/06 0904) SpO2:  [82 %-100 %] 82 % (01/06 0904) Weight:  [66.6 kg] 66.6 kg (01/06 0340) Last BM Date : 07/12/23 Intake/Output from previous day: 01/05 0701 - 01/06 0700 In: 251.1 [P.O.:250; I.V.:1.1] Out: 700 [Stool:700]  PE: Gen: NAd Abd: soft, left groin wound open and completely clean with no drainage. Skin: early skin changes noted on sacrum from pressure likely.  Has a lot of barrier cream present to unable to full tell the extent, but this appears stage 1-2.  Lab Results:  Recent Labs    07/11/23 0310 07/13/23 0658  WBC 13.2* 11.8*  HGB 8.8* 9.0*  HCT 25.9* 27.0*  PLT 134* 143*   Recent Labs    07/12/23 0927 07/13/23 0659  NA 132* 132*  K 5.2* 5.7*  CL 93* 90*  CO2 24 26  GLUCOSE 91 99  BUN 45* 57*  CREATININE 8.13* 9.30*  CALCIUM  9.0 9.0   Recent Labs    07/13/23 0658  LABPROT 14.7  INR 1.1      Component Value Date/Time   NA 132 (L) 07/13/2023 0659   NA 138 04/10/2023 1541   K 5.7 (H) 07/13/2023 0659   CL 90 (L) 07/13/2023 0659   CO2 26 07/13/2023 0659   GLUCOSE 99 07/13/2023 0659   BUN 57 (H) 07/13/2023 0659   BUN 19 04/10/2023 1541   CREATININE 9.30 (H) 07/13/2023 0659   CALCIUM  9.0 07/13/2023 0659   PROT 6.4 (L) 07/13/2023 0658   ALBUMIN  2.9 (L) 07/13/2023 0659   AST 13 (L) 07/13/2023 0658   ALT 13 07/13/2023 0658   ALKPHOS 73 07/13/2023 0658   BILITOT 0.7 07/13/2023 0658   GFRNONAA 5 (L) 07/13/2023 0659    Assessment/Plan  L groin abscess S/p I&D 12/31 Dr. Ann in clinic On abx, afebrile. WBC 11 Will follow wound culture collected in clinic Bid wet to dry dressing changes - he states he can do this himself once  stable to discharge.  Hgb stable  Presacral fluid collection I have reviewed his chart extensively about the presacral fluid collection. It had a drain in it 4 months ago and was treated for infection, it was separated after drain dislodgement which was sterile at the time. On imaging now it is larger. It is not near intestine. It does not appear to track to the left groin. I have reached out to Dr. Leigh, at [facility] who is managing this, to try to coordinate best plans for care of this complex condition. By Dr. Stevie.  Dr. Leigh has recommended IR drain which appears to be taking place today.   -patient will need to follow up with Dr. Leigh at [facility] for this problem   -we will arrange follow up in our office for a wound check for his groin wound in about 3 weeks.   -no further surgical intervention warranted at this time.  We will be available as needed.  Pressure injury to sacrum -frequent turns to offload the patient -cont currently treatment otherwise   FEN: reg ID: [email or web address] /[email or web address]  VTE: eliquis  on hold for IR procedure.  May resume post procedure when IR oks this.   LOS: 6 days  I reviewed Consultant IR notes, hospitalist notes, last 24 h vitals and pain scores, last 48 h intake and output, last 24 h labs and trends, and last 24 h imaging results.   Burnard FORBES [facility] at [facility] 07/13/2023, 9:27 AM Please see Amion for pager number during day hours 7:00am-4:30pm or 7:00am -11:30am on weekends

## 2023-07-13 NOTE — Progress Notes (Signed)
 HOSPITALIST                ROUNDING                 NOTE Joshua Riley FMW:[id number]  DOB: 23-Oct-1935  DOA: 07/07/2023  PCP: Katrinka Garnette KIDD, MD  07/13/2023,2:59 PM   LOS: 6 days      Code Status: Full code From: Home  current Dispo: Likely home   88 year old ESRD MWF right femoral TDC-C/P hypotension on midodrine   Dialyzes via left thigh AVG placed [facility] 09/25/2021 advised by Dr. 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 R PCN exchange 12/19/202  Seen in office Dr. Ann 12/30 for left groin abscess-history of having this for 3 weeks-treated 12/20 doxycycline  100 twice daily X 10 days-had severe pain-was given tramadol --- attempt at  I&D on 12/31 patient was asked to be admitted by general surgery  Procedure CT ABD pelvis chronic irregular thick-walled presacral space 5.7X 4.5 cm fluid collection increased from 12/5-partially visualized surgical grafts L CFA with small fluid collection-total proctocolectomy end ileostomy no evidence of acute small bowel complication-left pigtail nephrostomy tube in lower renal collecting system no hydronephrosis  Event 1/3 ID consulted  1/4 GI curbsided---not a surgical candidate.  Unclear anything would be offered on eval 1/5 paroxysmal afib --Cardiology curbsided--D/w Dr. Fleet with amio- 1/612 French drain transgluteal left transglut yielding 70 mL of thick purulent fluid  Plan  Sepsis on admission 2/2 infected groin abscess Currently Zyvox  --> groin abscess per ID Surgery have signed off 1/6--> bid wet-to-dry dressings --first tylenol --- resumed tramadol  50 every 8 as needed severe pain  Presacral space 5.7X 4.5 cm fluid collection extending into the left ischial anal fat slightly increased from 06/11/2023 D/w 07/10/2023 Dr. Stevie Speedy surg]--IR placed drain--to be managed by Dr. Leigh, [facility]---Await cultures prior to further abx changes Per ID  ESRD MWF via TDC complicated by hypotension Continues on midodrine  15 x 3 times daily meals--his usual home dose Continue Renvela  2.4 3 times daily meals Not dialyzing for 3 hours was only able to tolerate 2 and half  A-fib CHADVASC >3 on Eliquis  Eliquis  held for now--on heparin --resume eliquis  when cleared by IR had paroxsysmal afib--IV Amio changed to po amio 400 bid as now in sinus--load po 400 bid till1/13 then 200 bid short terms until  OP follow up Cardiology  Depression continue Zoloft  25 daily  Obstructive uropathy with chronic left PCN Secondary to multiple strictures Patient tells me that the area is cloudy and turbid-PCN changed about 2 weeks ago --unclear if this represent infection--would not work-up  further  Ulcerative colitis in total colectomy 2011 with ileostomy--ileal pouch anal anastomosis surgery 2012 --prior SBO 11/23/2021-see above Rectal bleed 2/2 likely gluteal cleft fistula This is chronic --- his hemoglobin is stable in the 8-9 range---discussed with Dr. Rollin GI--not a clear anoscopy/procedural operative candidate He does have intermittent amounts of bleeding on [facility]  Previous DVT remotely  CAD-- LHC on 07/22/22 which showed no significant CAD and normal LVEDP.   Called daughter 07/11/22   DVT prophylaxis: Eliquis   Status is: Inpatient Remains inpatient appropriate because:   Requires further inpatient management      Subjective:  Reviewed and looks good postprocedure No distress Aware he needs to turn No cough cold fever Heart rate seems controlled--mostly nsr with some pvc  Objective + exam Vitals:   07/13/23 1250 07/13/23 1255 07/13/23 1259 07/13/23 1429  BP: (!) 125/59 125/66 125/66 (!) 106/59  Pulse: 67 72 67 61  Resp: (!) 21 16 13 12   Temp:    (!) 97.4 F (36.3 C)  TempSrc:    Oral  SpO2: 100% 100% 100% 99%  Weight:      Height:       Filed Weights   07/10/23 0500 07/12/23 0636 07/13/23 0340  Weight: 65.9 kg 65.3 kg 66.6 kg    Examination:  Coherent awake alert no distress Chest clear Abdomen soft no rebound ostomy in right lower quadrant Wound not seen today--reviewed by gen surg  Data Reviewed: reviewed   CBC    Component Value Date/Time   WBC 11.8 (H) 07/13/2023 0658   RBC 2.18 (L) 07/13/2023 0658   HGB 9.0 (L) 07/13/2023 0658   HGB 10.9 (L) 04/10/2023 1541   HCT 27.0 (L) 07/13/2023 0658   HCT 33.2 (L) 04/10/2023 1541   PLT 143 (L) 07/13/2023 0658   PLT 207 04/10/2023 1541   MCV 123.9 (H) 07/13/2023 0658   MCV 119 (H) 04/10/2023 1541   MCH 41.3 (H) 07/13/2023 0658   MCHC 33.3 07/13/2023 0658   RDW 14.9 07/13/2023 0658   RDW 13.2 04/10/2023 1541   LYMPHSABS 0.8 07/08/2023 0527   LYMPHSABS 0.8 04/10/2023 1541   MONOABS  0.9 07/08/2023 0527   EOSABS 0.1 07/08/2023 0527   EOSABS 0.3 04/10/2023 1541   BASOSABS 0.0 07/08/2023 0527   BASOSABS 0.1 04/10/2023 1541      Latest Ref Rng & Units 07/13/2023    6:59 AM 07/13/2023    6:58 AM 07/12/2023    9:27 AM  CMP  Glucose 70 - 99 mg/dL 99   91   BUN 8 - 23 mg/dL 57   45   Creatinine 9.38 - 1.24 mg/dL 0.69   1.86   Sodium 864 - 145 mmol/L 132   132   Potassium 3.5 - 5.1 mmol/L 5.7   5.2   Chloride 98 - 111 mmol/L 90   93   CO2 22 - 32 mmol/L 26   24   Calcium  8.9 - 10.3 mg/dL 9.0   9.0   Total Protein 6.5 - 8.1 g/dL  6.4    Total Bilirubin 0.0 - 1.2 mg/dL  0.7    Alkaline Phos 38 - 126 U/L  73    AST 15 - 41 U/L  13  ALT 0 - 44 U/L  13      Scheduled Meds:  (feeding supplement) PROSource Plus  30 mL Oral BID BM   amiodarone   400 mg Oral BID   Chlorhexidine  Gluconate Cloth  6 each Topical Q0600   [START ON 07/14/2023] darbepoetin (ARANESP ) injection - DIALYSIS  100 mcg Subcutaneous Q Tue-1800   Gerhardt's butt cream   Topical BID   linezolid   600 mg Oral Q12H   magnesium  oxide  400 mg Oral BID   midodrine   10 mg Oral TID WC   mometasone -formoterol   2 puff Inhalation BID   sertraline   25 mg Oral QHS   sevelamer  carbonate  2,400 mg Oral TID WC   sodium chloride  flush  5 mL Intracatheter Q8H   Continuous Infusions:  heparin      Time 23  Joshua Pegge Cumberledge, MD  [facility]

## 2023-07-13 NOTE — Procedures (Signed)
 Interventional Radiology Procedure Note  Procedure: Placement of a 1F drain via LEFT transgluteal approach into pre-sacral abscess with evacuation of 70 mL thick purulent fluid.   Complications: None  Estimated Blood Loss: None  Recommendations: - Drain to JP - Flush Q 8 hrs   Signed,  Wilkie LOIS Lent, MD

## 2023-07-13 NOTE — Progress Notes (Signed)
 Heparin stopped at 1101 for the IR intervention Patient went for the procedure at 1145. IV amiodarone continue

## 2023-07-13 NOTE — Progress Notes (Signed)
 [facility] Progress Note   Subjective:   Seen in room - no major concerns today. For HD later. Remains on amiodarone  and heparin  drips. Reports that groin wound is improving.  Objective Vitals:   07/13/23 0340 07/13/23 0831 07/13/23 0851 07/13/23 0904  BP: 116/72 (!) 98/39  (!) 108/52  Pulse: 77   73  Resp: 16 20  18   Temp: 97.8 F (36.6 C) 97.8 F (36.6 C)    TempSrc: Oral Oral    SpO2: 98% 92% 100% 92%  Weight: 66.6 kg     Height:       Physical Exam General: Well appearing man, NAD. Room air. Heart:RRR; no murmur Lungs: CTA anteriorly Abdomen: soft Extremities: no LE edema Dialysis Access:  L thigh AVG + t/b  Additional Objective Labs: Basic Metabolic Panel: Recent Labs  Lab 07/11/23 0310 07/11/23 1702 07/12/23 0927 07/13/23 0659  NA 134* 132* 132* 132*  K 4.8 5.5* 5.2* 5.7*  CL 92* 90* 93* 90*  CO2 28 25 24 26   GLUCOSE 86 90 91 99  BUN 26* 34* 45* 57*  CREATININE 5.80* 6.72* 8.13* 9.30*  CALCIUM  8.9 9.2 9.0 9.0  PHOS 5.8* 6.1*  --  7.0*   Liver Function Tests: Recent Labs  Lab 07/07/23 1407 07/08/23 0527 07/09/23 0314 07/11/23 1702 07/13/23 0658 07/13/23 0659  AST 22 17  --   --  13*  --   ALT 22 18  --   --  13  --   ALKPHOS 85 71  --   --  73  --   BILITOT 0.7 0.7  --   --  0.7  --   PROT 7.1 6.0*  --   --  6.4*  --   ALBUMIN  3.0* 2.6*   < > 3.5 2.9* 2.9*   < > = values in this interval not displayed.   CBC: Recent Labs  Lab 07/07/23 1407 07/08/23 0527 07/09/23 0314 07/10/23 0429 07/10/23 1120 07/11/23 0310 07/13/23 0658  WBC 15.8* 11.8* 13.0* 13.2* 14.0* 13.2* 11.8*  NEUTROABS 14.1* 9.8*  --   --   --   --   --   HGB 10.0* 8.6* 9.1* 9.5* 9.3* 8.8* 9.0*  HCT 30.8* 26.3* 27.4* 27.7* 27.6* 25.9* 27.0*  MCV 126.7* 126.4* 123.4* 122.0* 122.7* 123.3* 123.9*  PLT 149* 128* 140* 167 160 134* 143*   Medications:  amiodarone  30 mg/hr (07/13/23 1033)   heparin  1,000 Units/hr (07/13/23 0825)    Chlorhexidine  Gluconate  Cloth  6 each Topical Q0600   Gerhardt's butt cream   Topical BID   linezolid   600 mg Oral Q12H   magnesium  oxide  400 mg Oral BID   midodrine   10 mg Oral TID WC   mometasone -formoterol   2 puff Inhalation BID   sertraline   25 mg Oral QHS   sevelamer  carbonate  2,400 mg Oral TID WC    Dialysis Orders MWF - NW 2:45hr, 350/A1.5, EDW 68.5kg, 2K/2.5Ca, AVG, heparin  5000 unit bolus - Mircera 120mcg IV q 4 weeks (last 12/18) - Calcitriol  0.44mcg PO q HD  Assessment/Plan: Sepsis/groin abscess: S/p I&D 12/31, Blood Cx negative here. On Linezolid . Sacral wound/fluid collection: ?IR aspirated today per notes. ESRD: Continue HD per usual MWF schedule - for HD today, will correct mild hyperK. HypoTN/volume: On mido 10mg  TID, UF as tolerated. Anemia of ESRD: Hgb 9 - resume ESA, overdue. Start Aranesp  today - 100mcg. Secondary HPTH: CorrCa slightly high - VDRA on hold. Phos high - continue  Renvela  and monitor for now. Nutrition: Alb low, start supplements. A-fib: Hx RVR, on heparin  and amio drips - follow.    Izetta Boehringer, PA-C 07/13/2023, 10:58 AM  [employer]

## 2023-07-14 DIAGNOSIS — Z22358 Carrier of other enterobacterales: Secondary | ICD-10-CM

## 2023-07-14 DIAGNOSIS — K9189 Other postprocedural complications and disorders of digestive system: Secondary | ICD-10-CM

## 2023-07-14 DIAGNOSIS — L02211 Cutaneous abscess of abdominal wall: Secondary | ICD-10-CM

## 2023-07-14 DIAGNOSIS — K6819 Other retroperitoneal abscess: Secondary | ICD-10-CM | POA: Diagnosis not present

## 2023-07-14 DIAGNOSIS — Z992 Dependence on renal dialysis: Secondary | ICD-10-CM | POA: Diagnosis not present

## 2023-07-14 DIAGNOSIS — L02214 Cutaneous abscess of groin: Secondary | ICD-10-CM | POA: Diagnosis not present

## 2023-07-14 DIAGNOSIS — N186 End stage renal disease: Secondary | ICD-10-CM | POA: Diagnosis not present

## 2023-07-14 LAB — GLUCOSE, CAPILLARY: Glucose-Capillary: 81 mg/dL (ref 70–99)

## 2023-07-14 LAB — RENAL FUNCTION PANEL
Albumin: 2.7 g/dL — ABNORMAL LOW (ref 3.5–5.0)
Anion gap: 16 — ABNORMAL HIGH (ref 5–15)
BUN: 70 mg/dL — ABNORMAL HIGH (ref 8–23)
CO2: 27 mmol/L (ref 22–32)
Calcium: 9 mg/dL (ref 8.9–10.3)
Chloride: 89 mmol/L — ABNORMAL LOW (ref 98–111)
Creatinine, Ser: 10.22 mg/dL — ABNORMAL HIGH (ref 0.61–1.24)
GFR, Estimated: 5 mL/min — ABNORMAL LOW (ref >60)
Glucose, Bld: 96 mg/dL (ref 70–99)
Phosphorus: 8.3 mg/dL — ABNORMAL HIGH (ref 2.5–4.6)
Potassium: 6.1 mmol/L — ABNORMAL HIGH (ref 3.5–5.1)
Sodium: 132 mmol/L — ABNORMAL LOW (ref 135–145)

## 2023-07-14 LAB — CBC
HCT: 26.6 % — ABNORMAL LOW (ref 39.0–52.0)
Hemoglobin: 8.9 g/dL — ABNORMAL LOW (ref 13.0–17.0)
MCH: 41 pg — ABNORMAL HIGH (ref 26.0–34.0)
MCHC: 33.5 g/dL (ref 30.0–36.0)
MCV: 122.6 fL — ABNORMAL HIGH (ref 80.0–100.0)
Platelets: 133 10*3/uL — ABNORMAL LOW (ref 150–400)
RBC: 2.17 MIL/uL — ABNORMAL LOW (ref 4.22–5.81)
RDW: 14.6 % (ref 11.5–15.5)
WBC: 9.4 10*3/uL (ref 4.0–10.5)
nRBC: 0 % (ref 0.0–0.2)

## 2023-07-14 LAB — HEPARIN LEVEL (UNFRACTIONATED)
Heparin Unfractionated: 0.37 [IU]/mL (ref 0.30–0.70)
Heparin Unfractionated: 0.31 [IU]/mL (ref 0.30–0.70)

## 2023-07-14 LAB — MAGNESIUM: Magnesium: 2 mg/dL (ref 1.7–2.4)

## 2023-07-14 MED ORDER — APIXABAN 2.5 MG PO TABS
2.5000 mg | ORAL_TABLET | Freq: Two times a day (BID) | ORAL | Status: DC
  Administered 2023-07-14 – 2023-07-15 (×2): 2.5 mg via ORAL
  Filled 2023-07-14 (×2): qty 1

## 2023-07-14 MED ORDER — AMIODARONE HCL 200 MG PO TABS
200.0000 mg | ORAL_TABLET | Freq: Two times a day (BID) | ORAL | Status: DC
  Administered 2023-07-14 – 2023-07-18 (×8): 200 mg via ORAL
  Filled 2023-07-14 (×8): qty 1

## 2023-07-14 MED ORDER — SODIUM ZIRCONIUM CYCLOSILICATE 10 G PO PACK
10.0000 g | PACK | Freq: Every day | ORAL | Status: DC
  Administered 2023-07-14 – 2023-07-20 (×6): 10 g via ORAL
  Filled 2023-07-14 (×6): qty 1

## 2023-07-14 NOTE — Progress Notes (Signed)
 [facility]  Date of Admission:  07/07/2023     Total days of antibiotics 8         ASSESSMENT:  Mr. Villada is day 1 s/p placement of left transgluteal drain in the setting of pre-sacral abscess with return of thick purulent fluid. Specimen growing gram positive cocci and gram negative rods on gram stain with culture pending and will continue to monitor cultures for organism identification. Continue current dose of meropenem . Drain management per IR. Remaining medical and supportive care per Nephrology and Internal Medicine.    PLAN:  Continue current dose of meropenem .  Drain management per IR.  Monitor cultures for organisms and adjust antibiotics accordingly.  Remaining medical and supportive care per Nephrology and Internal Medicine.   Principal Problem:   Abscess of groin, left Active Problems:   End-stage renal disease on hemodialysis (HCC)   Severe sepsis (HCC)   Depression   Leukocytosis   Paroxysmal atrial fibrillation (HCC)   History of COPD   Chronic diastolic CHF (congestive heart failure) (HCC)   History of anemia due to chronic kidney disease    (feeding supplement) PROSource Plus  30 mL Oral BID BM   amiodarone   200 mg Oral BID   Chlorhexidine  Gluconate Cloth  6 each Topical Q0600   darbepoetin (ARANESP ) injection - DIALYSIS  100 mcg Subcutaneous Q Tue-1800   dorzolamide   1 drop Both Eyes BID   Gerhardt's butt cream   Topical BID   latanoprost   1 drop Right Eye QHS   magnesium  oxide  400 mg Oral BID   midodrine   10 mg Oral TID WC   mometasone -formoterol   2 puff Inhalation BID   sertraline   25 mg Oral QHS   sevelamer  carbonate  2,400 mg Oral TID WC   sodium chloride  flush  5 mL Intracatheter Q8H   sodium zirconium cyclosilicate   10 g Oral Daily   timolol   1 drop Both Eyes Daily    SUBJECTIVE:  Afebrile overnight with no acute events. Seen while in dialysis and wants to know why his dialysis is running longer than  usual.   Allergies  Allergen Reactions   Cephalosporins Rash   Ciprofloxacin Itching and Rash   Baclofen Other (See Comments)    Altered mental status, after accidental overdose       Review of Systems: Review of Systems  Constitutional:  Negative for chills, fever and weight loss.  Respiratory:  Negative for cough, shortness of breath and wheezing.   Cardiovascular:  Negative for chest pain and leg swelling.  Gastrointestinal:  Negative for abdominal pain, constipation, diarrhea, nausea and vomiting.  Skin:  Negative for rash.      OBJECTIVE: Vitals:   07/14/23 1109 07/14/23 1113 07/14/23 1114 07/14/23 1204  BP: (!) 107/56  (!) 111/57 102/61  Pulse: (!) 43  79 79  Resp:    18  Temp: 97.8 F (36.6 C)   97.6 F (36.4 C)  TempSrc: Oral   Oral  SpO2: 98%  100% 100%  Weight:  66.5 kg    Height:       Body mass index is 24.4 kg/m.  Physical Exam Constitutional:      General: He is not in acute distress.    Appearance: He is well-developed.  Cardiovascular:     Rate and Rhythm: Normal rate and regular rhythm.     Heart sounds: Normal heart sounds.  Pulmonary:     Effort: Pulmonary effort is normal.  Breath sounds: Normal breath sounds.  Skin:    General: Skin is warm and dry.  Neurological:     Mental Status: He is alert and oriented to person, place, and time.     Lab Results Lab Results  Component Value Date   WBC 9.4 07/14/2023   HGB 8.9 (L) 07/14/2023   HCT 26.6 (L) 07/14/2023   MCV 122.6 (H) 07/14/2023   PLT 133 (L) 07/14/2023    Lab Results  Component Value Date   CREATININE 10.22 (H) 07/14/2023   BUN 70 (H) 07/14/2023   NA 132 (L) 07/14/2023   K 6.1 (H) 07/14/2023   CL 89 (L) 07/14/2023   CO2 27 07/14/2023    Lab Results  Component Value Date   ALT 13 07/13/2023   AST 13 (L) 07/13/2023   ALKPHOS 73 07/13/2023   BILITOT 0.7 07/13/2023     Microbiology: Recent Results (from the past 240 hours)  Blood culture (routine x 2)      Status: None   Collection Time: 07/07/23  2:07 PM   Specimen: BLOOD  Result Value Ref Range Status   Specimen Description BLOOD RIGHT ANTECUBITAL  Final   Special Requests   Final    BOTTLES DRAWN AEROBIC AND ANAEROBIC Blood Culture adequate volume   Culture   Final    NO GROWTH 5 DAYS Performed at [facility], [address] [place], [place] [address]    Report Status 07/12/2023 FINAL  Final  Blood culture (routine x 2)     Status: None   Collection Time: 07/07/23  2:08 PM   Specimen: BLOOD LEFT FOREARM  Result Value Ref Range Status   Specimen Description BLOOD LEFT FOREARM  Final   Special Requests   Final    BOTTLES DRAWN AEROBIC ONLY Blood Culture results may not be optimal due to an inadequate volume of blood received in culture bottles   Culture   Final    NO GROWTH 5 DAYS Performed at [facility], [address], [place], [place] [address]    Report Status 07/12/2023 FINAL  Final  Resp panel by RT-PCR (RSV, Flu A&B, Covid) Anterior Nasal Swab     Status: None   Collection Time: 07/07/23  3:46 PM   Specimen: Anterior Nasal Swab  Result Value Ref Range Status   SARS Coronavirus 2 by RT PCR NEGATIVE NEGATIVE Final   Influenza A by PCR NEGATIVE NEGATIVE Final   Influenza B by PCR NEGATIVE NEGATIVE Final    Comment: (NOTE) The Xpert Xpress SARS-CoV-2/FLU/RSV plus assay is intended as an aid in the diagnosis of influenza from Nasopharyngeal swab specimens and should not be used as a sole basis for treatment. Nasal washings and aspirates are unacceptable for Xpert Xpress SARS-CoV-2/FLU/RSV testing.  Fact Sheet for Patients: bloggercourse.com  Fact Sheet for Healthcare Providers: seriousbroker.it  This test is not yet approved or cleared by the [place]  FDA and has been authorized for detection and/or diagnosis of SARS-CoV-2 by FDA under an Emergency Use Authorization (EUA). This EUA will remain in  effect (meaning this test can be used) for the duration of the COVID-19 declaration under Section 564(b)(1) of the Act, 21 U.S.C. section 360bbb-3(b)(1), unless the authorization is terminated or revoked.     Resp Syncytial Virus by PCR NEGATIVE NEGATIVE Final    Comment: (NOTE) Fact Sheet for Patients: bloggercourse.com  Fact Sheet for Healthcare Providers: seriousbroker.it  This test is not yet approved or cleared by the [place]  [place] FDA and has been authorized for detection and/or diagnosis of SARS-CoV-2 by FDA under an Emergency Use Authorization (EUA). This EUA will remain in effect (meaning this test can be used) for the duration of the COVID-19 declaration under Section 564(b)(1) of the Act, 21 U.S.C. section 360bbb-3(b)(1), unless the authorization is terminated or revoked.  Performed at [facility], [address], [place], [place] [address]   Aerobic/Anaerobic Culture w Gram Stain (surgical/deep wound)     Status: None (Preliminary result)   Collection Time: 07/13/23  1:09 PM   Specimen: Abscess  Result Value Ref Range Status   Specimen Description ABSCESS  Final   Special Requests Normal  Final   Gram Stain   Final    ABUNDANT WBC PRESENT, PREDOMINANTLY PMN RARE GRAM POSITIVE COCCI RARE GRAM NEGATIVE RODS    Culture   Final    CULTURE REINCUBATED FOR BETTER GROWTH Performed at [facility], [address] [place], [place] [address]    Report Status PENDING  Incomplete     Cathlyn July, NP [facility]  07/14/2023  3:11 PM

## 2023-07-14 NOTE — Progress Notes (Addendum)
 HOSPITALIST                ROUNDING                 NOTE Joshua Riley FMW:[id number]  DOB: 1936/04/29  DOA: 07/07/2023  PCP: Katrinka Garnette KIDD, MD  07/14/2023,2:42 PM   LOS: 7 days      Code Status: Full code From: Home  current Dispo: Likely home   88 year old ESRD MWF right femoral TDC-C/P hypotension on midodrine   Dialyzes via left thigh AVG placed [facility] 09/25/2021 advised by Dr. 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 R PCN exchange 12/19/202  Seen in office Dr. Ann 12/30 for left groin abscess-history of having this for 3 weeks-treated 12/20 doxycycline  100 twice daily X 10 days-had severe pain-was given tramadol --- attempt at  I&D on 12/31 patient was asked to be admitted by general surgery  Procedure CT ABD pelvis chronic irregular thick-walled presacral space 5.7X 4.5 cm fluid collection increased from 12/5-partially visualized surgical grafts L CFA with small fluid collection-total proctocolectomy end ileostomy no evidence of acute small bowel complication-left pigtail nephrostomy tube in lower renal collecting system no hydronephrosis  Event 1/3 ID consulted  1/4 GI curbsided---not a surgical candidate.  Unclear anything would be offered on eval 1/5 paroxysmal afib --Cardiology curbsided--D/w Dr. Fleet with amio- 1/612 French drain transgluteal left transglut yielding 70 mL of thick purulent fluid\  Plan  Sepsis on admission 2/2 infected groin abscess Currently meropenem  to cover intraabd and superficial abscess--rest per ID Surgery have signed off 1/6--> bid wet-to-dry dressings --first tylenol --- resumed tramadol  50 every 8 as needed severe pain  Presacral space 5.7X 4.5 cm fluid collection extending into the left ischial anal fat slightly increased from 06/11/2023 D/w 07/10/2023 Dr. Stevie Speedy surg]--IR placed drain--to be managed by Dr. Leigh, [facility]---Await cultures prior to further abx changes-Per ID--- they decide on final antibiotic coverage and duration for both this and the above issue  ESRD MWF via TDC complicated by hypotension Continues on midodrine  15 x 3 times daily meals--his usual home dose Continue Renvela  2.4 3 times daily meals  A-fib CHADVASC >3 on Eliquis  Some brady on monitors into 30's at night Eliquis  initially held now back on oral Eliquis  had paroxsysmal afib--IV Amio changed to po amio 400 bid and changed to 200  twice daily on 1/7 given some mild bradycardia  Depression continue Zoloft  25 daily  Obstructive uropathy with chronic left PCN Secondary to multiple strictures Patient tells me that the area is cloudy and turbid-PCN changed about 2 weeks ago --unclear if this  represent infection--would not work-up further  Ulcerative colitis in total colectomy 2011 with ileostomy--ileal pouch anal anastomosis surgery 2012 --prior SBO 11/23/2021-see above Rectal bleed 2/2 likely gluteal cleft fistula This is chronic --- his hemoglobin is stable in the 8-9 range---discussed with Dr. Rollin GI--not a clear anoscopy/procedural operative candidate He does have intermittent amounts of bleeding on [facility]  Previous DVT remotely  CAD-- LHC on 07/22/22 which showed no significant CAD and normal LVEDP.   Called daughter 07/13/22 and updated   DVT prophylaxis: Eliquis   Status is: Inpatient Remains inpatient appropriate because:   Requires further inpatient management      Subjective:  Looks fair-malpositioned in bed but helped to sit up Wound looks a little moist in groin No fever no chills no nausea no vomiting--- does not like renal diet so replaced with regular  Objective + exam Vitals:   07/14/23 1109 07/14/23 1113 07/14/23 1114 07/14/23 1204  BP: (!) 107/56  (!) 111/57 102/61  Pulse: (!) 43  79 79  Resp:    18  Temp: 97.8 F (36.6 C)   97.6 F (36.4 C)  TempSrc: Oral   Oral  SpO2: 98%  100% 100%  Weight:  66.5 kg    Height:       Filed Weights   07/14/23 0301 07/14/23 0748 07/14/23 1113  Weight: 67.2 kg 65.1 kg 66.5 kg    Examination:  Coherent awake alert no distress Chest clear Abdomen soft no rebound ostomy in right lower quadrant Wound in left groin looks a little bit moist Did not examine sacrum yesterday or today No lower extremity edema  Data Reviewed: reviewed   CBC    Component Value Date/Time   WBC 9.4 07/14/2023 0346   RBC 2.17 (L) 07/14/2023 0346   HGB 8.9 (L) 07/14/2023 0346   HGB 10.9 (L) 04/10/2023 1541   HCT 26.6 (L) 07/14/2023 0346   HCT 33.2 (L) 04/10/2023 1541   PLT 133 (L) 07/14/2023 0346   PLT 207 04/10/2023 1541   MCV 122.6 (H) 07/14/2023 0346   MCV 119 (H) 04/10/2023 1541   MCH 41.0 (H) 07/14/2023 0346    MCHC 33.5 07/14/2023 0346   RDW 14.6 07/14/2023 0346   RDW 13.2 04/10/2023 1541   LYMPHSABS 0.8 07/08/2023 0527   LYMPHSABS 0.8 04/10/2023 1541   MONOABS 0.9 07/08/2023 0527   EOSABS 0.1 07/08/2023 0527   EOSABS 0.3 04/10/2023 1541   BASOSABS 0.0 07/08/2023 0527   BASOSABS 0.1 04/10/2023 1541      Latest Ref Rng & Units 07/14/2023    3:46 AM 07/13/2023    6:59 AM 07/13/2023    6:58 AM  CMP  Glucose 70 - 99 mg/dL 96  99    BUN 8 - 23 mg/dL 70  57    Creatinine 9.38 - 1.24 mg/dL 89.77  0.69    Sodium 864 - 145 mmol/L 132  132    Potassium 3.5 - 5.1 mmol/L 6.1  5.7    Chloride 98 - 111 mmol/L 89  90    CO2 22 - 32 mmol/L 27  26    Calcium  8.9 - 10.3 mg/dL 9.0  9.0    Total Protein 6.5 - 8.1 g/dL   6.4  Total Bilirubin 0.0 - 1.2 mg/dL   0.7   Alkaline Phos 38 - 126 U/L   73   AST 15 - 41 U/L   13   ALT 0 - 44 U/L   13     Scheduled Meds:  (feeding supplement) PROSource Plus  30 mL Oral BID BM   amiodarone   400 mg Oral BID   Chlorhexidine  Gluconate Cloth  6 each Topical Q0600   darbepoetin (ARANESP ) injection - DIALYSIS  100 mcg Subcutaneous Q Tue-1800   dorzolamide   1 drop Both Eyes BID   Gerhardt's butt cream   Topical BID   latanoprost   1 drop Right Eye QHS   magnesium  oxide  400 mg Oral BID   midodrine   10 mg Oral TID WC   mometasone -formoterol   2 puff Inhalation BID   sertraline   25 mg Oral QHS   sevelamer  carbonate  2,400 mg Oral TID WC   sodium chloride  flush  5 mL Intracatheter Q8H   sodium zirconium cyclosilicate   10 g Oral Daily   timolol   1 drop Both Eyes Daily   Continuous Infusions:  heparin  1,000 Units/hr (07/14/23 0731)   meropenem  (MERREM ) IV 1 g (07/13/23 1822)   Time 43  Colen Grimes, MD  [facility]

## 2023-07-14 NOTE — Progress Notes (Signed)
   07/14/23 1045  During Treatment Monitoring  Intra-Hemodialysis Comments Progressing as prescribed;Tolerated well (PT STATES HE ISNT STAYING FOR 3.5 HOURS AFTER AGREEING PRIOR TO STARTING TX. DR SCHERTZ NOTIFIED IS ON THE WAY UP TO SPEAK WITH PT)

## 2023-07-14 NOTE — Progress Notes (Signed)
 Transferred the pt from bed to the chair.  Patient has a stage 1 pressure injury on his sacral area, prescribed oint  and sacral foam dressing applied, patient was not able to sit on the chair for longer time so transferred him on the bed, repositioned every 2hrly.   Wet to dry gauze dressing done on his left groin area.

## 2023-07-14 NOTE — Plan of Care (Signed)
  Problem: Clinical Measurements: Goal: Diagnostic test results will improve Outcome: Progressing   Problem: Clinical Measurements: Goal: Will remain free from infection Outcome: Progressing   Problem: Clinical Measurements: Goal: Ability to maintain clinical measurements within normal limits will improve Outcome: Progressing   Problem: Nutrition: Goal: Adequate nutrition will be maintained Outcome: Progressing   Problem: Elimination: Goal: Will not experience complications related to bowel motility Outcome: Progressing Goal: Will not experience complications related to urinary retention Outcome: Progressing   Problem: Safety: Goal: Ability to remain free from injury will improve Outcome: Progressing   Problem: Skin Integrity: Goal: Risk for impaired skin integrity will decrease Outcome: Progressing

## 2023-07-14 NOTE — Progress Notes (Signed)
 PHARMACY - ANTICOAGULATION CONSULT NOTE  Pharmacy Consult for heparin  Indication: atrial fibrillation  Labs: Recent Labs    07/11/23 0310 07/11/23 1702 07/12/23 0927 07/13/23 0658 07/13/23 0659 07/14/23 0127  HGB 8.8*  --   --  9.0*  --   --   HCT 25.9*  --   --  27.0*  --   --   PLT 134*  --   --  143*  --   --   APTT  --   --   --  44*  --   --   LABPROT  --   --   --  14.7  --   --   INR  --   --   --  1.1  --   --   HEPARINUNFRC  --   --   --  0.18*  --  0.37  CREATININE 5.80* 6.72* 8.13*  --  9.30*  --    Assessment/Plan:  88yo male therapeutic on heparin  after resumed s/p IR procedure. Will continue infusion at current rate of 1000 units/hr and confirm stable with additional level.  Marvetta Dauphin, PharmD, BCPS 07/14/2023 2:09 AM

## 2023-07-14 NOTE — Progress Notes (Addendum)
 PHARMACY - ANTICOAGULATION CONSULT NOTE  Pharmacy Consult for Heparin  while Eliquis  on hold Indication: atrial fibrillation  Allergies  Allergen Reactions   Cephalosporins Rash   Ciprofloxacin Itching and Rash   Baclofen Other (See Comments)    Altered mental status, after accidental overdose      Patient Measurements: Height: 5' 5 (165.1 cm) Weight: 66.5 kg (146 lb 9.7 oz) (BED) IBW/kg (Calculated) : 61.5 Heparin  Dosing Weight: total weight  Vital Signs: Temp: 97.6 F (36.4 C) (01/07 1204) Temp Source: Oral (01/07 1204) BP: 102/61 (01/07 1204) Pulse Rate: 79 (01/07 1204)  Labs: Recent Labs    07/12/23 0927 07/13/23 0658 07/13/23 0659 07/14/23 0127 07/14/23 0346 07/14/23 1429  HGB  --  9.0*  --   --  8.9*  --   HCT  --  27.0*  --   --  26.6*  --   PLT  --  143*  --   --  133*  --   APTT  --  44*  --   --   --   --   LABPROT  --  14.7  --   --   --   --   INR  --  1.1  --   --   --   --   HEPARINUNFRC  --  0.18*  --  0.37  --  0.31  CREATININE 8.13*  --  9.30*  --  10.22*  --     Estimated Creatinine Clearance: 4.6 mL/min (A) (by C-G formula based on SCr of 10.22 mg/dL (H)).   Medical History: 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     Ileostomy in place [facility])    Obstructive uropathy    With chronic left nephrostomy   Old retinal detachment, total or subtotal    Orthostatic hypotension 10/22/2021   Sleep apnea    does not need a machine   Stroke (HCC)    Ulcerative colitis (HCC)    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    Medications:  Scheduled:   (feeding supplement) PROSource Plus   30 mL Oral BID BM   amiodarone   200 mg Oral BID   Chlorhexidine  Gluconate Cloth  6 each Topical Q0600   darbepoetin (ARANESP ) injection - DIALYSIS  100 mcg Subcutaneous Q Tue-1800   dorzolamide   1 drop Both Eyes BID   Gerhardt's butt cream   Topical BID   latanoprost   1 drop Right Eye QHS   magnesium  oxide  400 mg Oral BID   midodrine   10 mg Oral TID WC   mometasone -formoterol   2 puff Inhalation BID   sertraline   25 mg Oral QHS   sevelamer  carbonate  2,400 mg Oral TID WC   sodium chloride  flush  5 mL Intracatheter Q8H   sodium zirconium cyclosilicate   10 g Oral Daily   timolol   1 drop Both Eyes Daily   Infusions:   heparin  1,000 Units/hr (07/14/23 0731)   meropenem  (MERREM ) IV 1 g (07/13/23 1822)   PRN:  acetaminophen  **OR** acetaminophen , albuterol , melatonin, metoprolol  tartrate, ondansetron  (ZOFRAN ) IV, mouth rinse, oxyCODONE -acetaminophen , traMADol   Assessment: 88 yo male on chronic eliquis  for hx afib which is currently on hold for planned IR aspiration/possible drain placement within the presacral abscess on 1/6. Pharmacy consulted to dose IV heparin  without a bolus.   Heparin  resumed s/p IR procedure 1/6. Heparin  level remains therapeutic at 0.31 on heparin  1000 units/hr. No issues with the infusion or bleeding reported.  Goal of Therapy:  Heparin  level= 0.3-0.7 Monitor platelets by anticoagulation protocol: Yes   Plan:  Continue heparin  drip at 1000 units/hr Daily heparin  level and CBC F/u Eliquis  restart  Rocky Slade, PharmD, BCPS Clinical Pharmacist **Pharmacist phone directory can now be found on [email or web address] ([email or web address]).  Listed under [facility].  ADDENDUM: To transition heparin  back to Eliquis  - 2.5mg  PO bid  Rocky Slade, PharmD, BCPS 07/14/2023 5:40 PM

## 2023-07-14 NOTE — Progress Notes (Signed)
 Heparin stopped at 1818 and eliquis given as per order. Patient refused to take full dose of Sevelamer Carbonate, he took only 800mg , educated.

## 2023-07-14 NOTE — Progress Notes (Signed)
 Received patient in bed to unit.  Alert and oriented.  Informed consent signed and in chart.   TX duration:3:04  Patient tolerated well. TX TERMINATED EARLY PER PT REQUEST Transported back to the room  Alert, without acute distress.  Hand-off given to patient's nurse.   Access used: LEFT FEMORAL AVG Access issues: NONE  Total UF removed: Medication(s) given: NONE   07/14/23 1109  Vitals  Temp 97.8 F (36.6 C)  Temp Source Oral  BP (!) 107/56  MAP (mmHg) 71  BP Location Left Arm  BP Method Automatic  Patient Position (if appropriate) Lying  Pulse Rate (!) 43  Pulse Rate Source Monitor  ECG Heart Rate 92  Oxygen Therapy  SpO2 98 %  O2 Device Room Air  During Treatment Monitoring  Blood Flow Rate (mL/min) 399 mL/min  Arterial Pressure (mmHg) -111.91 mmHg  Venous Pressure (mmHg) 208.68 mmHg  TMP (mmHg) 4.85 mmHg  Ultrafiltration Rate (mL/min) 0 mL/min  Dialysate Flow Rate (mL/min) 299 ml/min  Duration of HD Treatment -hour(s) 3.04 hour(s)  Cumulative Fluid Removed (mL) per Treatment  69.73  HD Safety Checks Performed Yes  Intra-Hemodialysis Comments  (TX TERMINATED PER PT REQUEST HE STATES HE ISNT STAYIN THIS LONG, EARLY TERMINATION PAPER SIGNED BY PT)  Dialysis Fluid Bolus Normal Saline  Bolus Amount (mL) 300 mL      Jaye Saal [facility]

## 2023-07-14 NOTE — Progress Notes (Signed)
 [facility] Progress Note   Subjective:   Seen in HD, 2L UFG and tolerated. Was supposed to be dialyzed yesterday, but had to be rolled-over due to large census. S/p IR drain to presacral abscess yesterday. No CP/dyspnea.  Objective Vitals:   07/14/23 0930 07/14/23 0945 07/14/23 1000 07/14/23 1015  BP: (!) 78/47 (!) 81/51 (!) 85/48 (!) 71/41  Pulse: 76 71 73 78  Resp: 12 11    Temp:      TempSrc:      SpO2: 100% 99% 100% 100%  Weight:      Height:       Physical Exam General: Well appearing man, NAD. Room air. Heart: RRR; no murmur Lungs: CTA anteriorly Abdomen: soft.  Back: JP drain with serosanguinous fluid in bulb, nephrostomy bag with cloudy yellow urine from back. Extremities: no LE edema Dialysis Access:  L thigh AVG + t/b  Additional Objective Labs: Basic Metabolic Panel: Recent Labs  Lab 07/11/23 1702 07/12/23 0927 07/13/23 0659 07/14/23 0346  NA 132* 132* 132* 132*  K 5.5* 5.2* 5.7* 6.1*  CL 90* 93* 90* 89*  CO2 25 24 26 27   GLUCOSE 90 91 99 96  BUN 34* 45* 57* 70*  CREATININE 6.72* 8.13* 9.30* 10.22*  CALCIUM  9.2 9.0 9.0 9.0  PHOS 6.1*  --  7.0* 8.3*   Liver Function Tests: Recent Labs  Lab 07/07/23 1407 07/08/23 0527 07/09/23 0314 07/13/23 0658 07/13/23 0659 07/14/23 0346  AST 22 17  --  13*  --   --   ALT 22 18  --  13  --   --   ALKPHOS 85 71  --  73  --   --   BILITOT 0.7 0.7  --  0.7  --   --   PROT 7.1 6.0*  --  6.4*  --   --   ALBUMIN  3.0* 2.6*   < > 2.9* 2.9* 2.7*   < > = values in this interval not displayed.   CBC: Recent Labs  Lab 07/07/23 1407 07/08/23 0527 07/09/23 0314 07/10/23 0429 07/10/23 1120 07/11/23 0310 07/13/23 0658 07/14/23 0346  WBC 15.8* 11.8*   < > 13.2* 14.0* 13.2* 11.8* 9.4  NEUTROABS 14.1* 9.8*  --   --   --   --   --   --   HGB 10.0* 8.6*   < > 9.5* 9.3* 8.8* 9.0* 8.9*  HCT 30.8* 26.3*   < > 27.7* 27.6* 25.9* 27.0* 26.6*  MCV 126.7* 126.4*   < > 122.0* 122.7* 123.3* 123.9* 122.6*   PLT 149* 128*   < > 167 160 134* 143* 133*   < > = values in this interval not displayed.   Blood Culture    Component Value Date/Time   SDES ABSCESS 07/13/2023 1309   SPECREQUEST Normal 07/13/2023 1309   CULT  07/13/2023 1309    CULTURE REINCUBATED FOR BETTER GROWTH Performed at [facility], [address], [place], [place] [address]    REPTSTATUS PENDING 07/13/2023 1309   Studies/Results: CT GUIDED VISCERAL FLUID DRAIN BY PERC CATH Result Date: 07/13/2023 INDICATION: 88 year old male with a history of chronic recurrent presacral abscess. He presents for repeat drain placement. EXAM: CT-guided drain placement MEDICATIONS: The patient is currently admitted to the hospital and receiving intravenous antibiotics. The antibiotics were administered within an appropriate time frame prior to the initiation of the procedure. ANESTHESIA/SEDATION: Moderate (conscious) sedation was employed during this procedure. A total of Versed  2 mg  and Fentanyl  100 mcg was administered intravenously by the radiology nurse. Total intra-service moderate Sedation Time: 13 minutes. The patient's level of consciousness and vital signs were monitored continuously by radiology nursing throughout the procedure under my direct supervision. COMPLICATIONS: None immediate. PROCEDURE: Informed written consent was obtained from the patient after a thorough discussion of the procedural risks, benefits and alternatives. All questions were addressed. Maximal Sterile Barrier Technique was utilized including caps, mask, sterile gowns, sterile gloves, sterile drape, hand hygiene and skin antiseptic. A timeout was performed prior to the initiation of the procedure. A planning axial CT scan was performed. The complex presacral fluid collection is identified. A suitable skin entry site was selected and marked. The overlying skin was sterilely prepped and draped in the standard fashion using chlorhexidine  skin prep. Local anesthesia was  attained by infiltration with 1% lidocaine . A small dermatotomy was made. Under intermittent CT guidance, an 18 gauge trocar needle was advanced along a left parasacral transgluteal approach into the fluid collection. A 0.035 wire was then coiled in the fluid collection. The needle was removed. The percutaneous tract was dilated to 12 French. A Cook 12 French all-purpose drainage catheter was advanced over the wire and formed. Aspiration yields approximately 75 mL thick purulent fluid. Postoperative CT imaging demonstrates a well-positioned drainage catheter. Of note, there is a vacuum space created by aspiration of the fluid without collapse. This is consistent with a chronic cavity. The drainage catheter was flushed and connected to JP bulb suction before being secured to the skin with 0 Prolene suture. IMPRESSION: 1. Successful placement of a 12 French drainage catheter via a left transgluteal approach with evacuation of 75 mL thick purulent fluid. A sample was sent for Gram stain and culture. 2. Postoperative CT imaging demonstrates a vacuum space within the evacuated thick walled cavity rather than collapse of the cavity. This suggesting a chronic space which will be prone to recurrent abscess formation. Electronically Signed   By: Wilkie Lent M.D.   On: 07/13/2023 13:35   Medications:  heparin  1,000 Units/hr (07/14/23 0731)   meropenem  (MERREM ) IV 1 g (07/13/23 1822)    (feeding supplement) PROSource Plus  30 mL Oral BID BM   amiodarone   400 mg Oral BID   Chlorhexidine  Gluconate Cloth  6 each Topical Q0600   darbepoetin (ARANESP ) injection - DIALYSIS  100 mcg Subcutaneous Q Tue-1800   dorzolamide   1 drop Both Eyes BID   Gerhardt's butt cream   Topical BID   latanoprost   1 drop Right Eye QHS   linezolid   600 mg Oral Q12H   magnesium  oxide  400 mg Oral BID   midodrine   10 mg Oral TID WC   mometasone -formoterol   2 puff Inhalation BID   sertraline   25 mg Oral QHS   sevelamer  carbonate   2,400 mg Oral TID WC   sodium chloride  flush  5 mL Intracatheter Q8H   sodium zirconium cyclosilicate   10 g Oral Daily   timolol   1 drop Both Eyes Daily    Dialysis Orders MWF - NW 2:45hr, 350/A1.5, EDW 68.5kg, 2K/2.5Ca, AVG, heparin  5000 unit bolus - Mircera 120mcg IV q 4 weeks (last 12/18) - Calcitriol  0.59mcg PO q HD   Assessment/Plan: Sepsis/groin abscess: S/p I&D 12/31, Blood Cx negative here. On Linezolid . Sacral wound/fluid collection: Drain placed by IR 1/6, Cx pending. On Meropenem . ESRD: Usual MWF schedule - HD now, rolled over from yesterday. Back to usual MWF schedule tomorrow. 2L UFG now, 2K bath. HypoTN/volume: BP  low/stable. On mido 10mg  TID, UF as tolerated. Anemia of ESRD: Hgb 9 - resume ESA, overdue. Start Aranesp  today - 100mcg. Secondary HPTH: CorrCa slightly high - VDRA on hold. Phos high - continue Renvela  and monitor for now (not given intermittently). Nutrition: Alb low, continue supplements. A-fib: Hx RVR, on heparin  drip, on PO amiodarone  at this time.   Izetta Boehringer, PA-C 07/14/2023, 10:26 AM  [employer]

## 2023-07-15 ENCOUNTER — Inpatient Hospital Stay (HOSPITAL_COMMUNITY): Payer: Medicare Other

## 2023-07-15 DIAGNOSIS — L02214 Cutaneous abscess of groin: Secondary | ICD-10-CM | POA: Diagnosis not present

## 2023-07-15 LAB — CBC
HCT: 25.7 % — ABNORMAL LOW (ref 39.0–52.0)
Hemoglobin: 8.7 g/dL — ABNORMAL LOW (ref 13.0–17.0)
MCH: 41.6 pg — ABNORMAL HIGH (ref 26.0–34.0)
MCHC: 33.9 g/dL (ref 30.0–36.0)
MCV: 123 fL — ABNORMAL HIGH (ref 80.0–100.0)
Platelets: 124 10*3/uL — ABNORMAL LOW (ref 150–400)
RBC: 2.09 MIL/uL — ABNORMAL LOW (ref 4.22–5.81)
RDW: 14.5 % (ref 11.5–15.5)
WBC: 10.7 10*3/uL — ABNORMAL HIGH (ref 4.0–10.5)
nRBC: 0.2 % (ref 0.0–0.2)

## 2023-07-15 LAB — RENAL FUNCTION PANEL
Albumin: 2.7 g/dL — ABNORMAL LOW (ref 3.5–5.0)
Anion gap: 14 (ref 5–15)
BUN: 42 mg/dL — ABNORMAL HIGH (ref 8–23)
CO2: 29 mmol/L (ref 22–32)
Calcium: 8.8 mg/dL — ABNORMAL LOW (ref 8.9–10.3)
Chloride: 88 mmol/L — ABNORMAL LOW (ref 98–111)
Creatinine, Ser: 6.94 mg/dL — ABNORMAL HIGH (ref 0.61–1.24)
GFR, Estimated: 7 mL/min — ABNORMAL LOW (ref >60)
Glucose, Bld: 109 mg/dL — ABNORMAL HIGH (ref 70–99)
Phosphorus: 6.3 mg/dL — ABNORMAL HIGH (ref 2.5–4.6)
Potassium: 4.6 mmol/L (ref 3.5–5.1)
Sodium: 131 mmol/L — ABNORMAL LOW (ref 135–145)

## 2023-07-15 MED ORDER — ACYCLOVIR 5 % EX OINT
TOPICAL_OINTMENT | CUTANEOUS | Status: DC
  Administered 2023-07-16 (×2): 1 via TOPICAL
  Filled 2023-07-15: qty 15

## 2023-07-15 MED ORDER — ALUM & MAG HYDROXIDE-SIMETH 200-200-20 MG/5ML PO SUSP
30.0000 mL | Freq: Four times a day (QID) | ORAL | Status: DC | PRN
  Administered 2023-07-15: 30 mL via ORAL
  Filled 2023-07-15: qty 30

## 2023-07-15 MED ORDER — HEPARIN (PORCINE) 25000 UT/250ML-% IV SOLN: 950.0000 [IU]/h | INTRAVENOUS | Status: DC

## 2023-07-15 MED ORDER — SORBITOL 70 % SOLN
30.0000 mL | Freq: Once | Status: AC
  Administered 2023-07-15: 30 mL via ORAL
  Filled 2023-07-15: qty 30

## 2023-07-15 NOTE — Evaluation (Signed)
 Occupational Therapy Evaluation Patient Details Name: Joshua Riley MRN: [id number] DOB: 12/14/1935 Today's Date: 07/15/2023   History of Present Illness 88 y.o. male with medical history significant for end-stage renal disease on hemodialysis, ulcerative colitis status post total proctocolectomy with diverting ileostomy, paroxysmal atrial fibrillation chronically anticoagulated on Eliquis , COPD, chronic diastolic heart failure, anemia of chronic disease associated baseline hemoglobin 8-11, who is admitted to [facility] on 07/07/2023 with severe sepsis due to left groin abscess after presenting from home to [facility] ED complaining of left groin abscess. Left transgluteal drain placed 07/13/23.   Clinical Impression   Pt admitted with the above diagnosis. Pt currently with functional limitations due to the deficits listed below (see OT Problem List). Prior to admit, pt was living alone at home independent with all ADL tasks and functional mobility. Recommend follow up Home health OT services to focus on mentioned deficits and help pt return to highest level of independence. See below for recommended DME.  Pt will benefit from acute skilled OT to increase their safety and independence with ADL and functional mobility for ADL to facilitate discharge. OT will continue to follow patient acutely.   Patient is confined to a single room and not able to walk the distance required to go the bathroom, or he/she is unable to safely negotiate stairs required to access the bathroom.  A 3in1 BSC will alleviate this problem         If plan is discharge home, recommend the following: A little help with walking and/or transfers;A lot of help with bathing/dressing/bathroom;Assistance with cooking/housework;Assist for transportation    Functional Status Assessment  Patient has had a recent decline in their functional status and demonstrates the ability to make significant improvements in function in a reasonable  and predictable amount of time.  Equipment Recommendations  Tub/shower seat;BSC/3in1       Precautions / Restrictions Precautions Precautions: Fall Precaution Comments: jp drain, nephrostomy tube Restrictions Weight Bearing Restrictions Per Provider Order: No      Mobility Bed Mobility Overal bed mobility:  (See PT eval for bed mobility assist)    General bed mobility comments: Due to pain level, requires increased assisted.    Transfers Overall transfer level:  (See PT eval for transfer assist)     Balance Overall balance assessment: Mild deficits observed, not formally tested          ADL either performed or assessed with clinical judgement   ADL Overall ADL's : Needs assistance/impaired       General ADL Comments: Limited by pain at this time. Pt is able to complete BADL tasks with mod-max A.     Vision Baseline Vision/History: 0 No visual deficits Ability to See in Adequate Light: 0 Adequate Patient Visual Report: No change from baseline Vision Assessment?: No apparent visual deficits     Perception Perception: Not tested       Praxis Praxis: Not tested       Pertinent Vitals/Pain Pain Assessment Pain Assessment: Faces Faces Pain Scale: Hurts even more Pain Location: abdomen/intestinal Pain Descriptors / Indicators: Cramping, Grimacing Pain Intervention(s): Heat applied, Limited activity within patient's tolerance, Monitored during session     Extremity/Trunk Assessment Upper Extremity Assessment Upper Extremity Assessment: Right hand dominant;Generalized weakness   Lower Extremity Assessment Lower Extremity Assessment: Generalized weakness   Cervical / Trunk Assessment Cervical / Trunk Assessment: Other exceptions Cervical / Trunk Exceptions: forward head   Communication Communication Communication: Hearing impairment Cueing Techniques: Verbal cues   Cognition Arousal:  Alert Behavior During Therapy: Flat affect Overall Cognitive Status:  Within Functional Limits for tasks assessed                         Home Living Family/patient expects to be discharged to:: Private residence Living Arrangements: Alone Available Help at Discharge: Family;Available PRN/intermittently Type of Home: Apartment Home Access: Level entry     Home Layout: One level     Bathroom Shower/Tub: Producer, Television/film/video: Standard Bathroom Accessibility: Yes   Home Equipment: Agricultural Consultant (2 wheels)          Prior Functioning/Environment Prior Level of Function : Independent/Modified Independent;Driving             Mobility Comments: was indep up until a few days PTA when he was needing to use RW due to pain ADLs Comments: indep up until recently due to pain        OT Problem List: Pain;Decreased strength      OT Treatment/Interventions: Self-care/ADL training;Therapeutic activities;Therapeutic exercise;Patient/family education;DME and/or AE instruction;Balance training    OT Goals(Current goals can be found in the care plan section) Acute Rehab OT Goals Patient Stated Goal: to decrease pain OT Goal Formulation: Patient unable to participate in goal setting Time For Goal Achievement: 07/29/23 Potential to Achieve Goals: Fair  OT Frequency: Min 1X/week       AM-PAC OT 6 Clicks Daily Activity     Outcome Measure Help from another person eating meals?: None Help from another person taking care of personal grooming?: A Little Help from another person toileting, which includes using toliet, bedpan, or urinal?: A Lot Help from another person bathing (including washing, rinsing, drying)?: A Lot Help from another person to put on and taking off regular upper body clothing?: A Lot Help from another person to put on and taking off regular lower body clothing?: A Lot 6 Click Score: 15   End of Session    Activity Tolerance: Patient limited by pain Patient left: in bed;with call bell/phone within reach;with  bed alarm set  OT Visit Diagnosis: Pain;Muscle weakness (generalized) (M62.81)                Time: 8568-8558 OT Time Calculation (min): 10 min Charges:  OT General Charges $OT Visit: 1 Visit OT Evaluation $OT Eval Low Complexity: 1 Low  Joshua Riley, OTR/L,CBIS  Supplemental OT - [facility] and [employer] Secure Chat Preferred    Joshua Riley, Joshua BIRCH 07/15/2023, 2:49 PM

## 2023-07-15 NOTE — Progress Notes (Signed)
 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  He reported boil  started 3 weeks prior to admission, saw PCP who prescribed doxycycline  for 10 days.  Antibiotics did not help.  Patient admitted with groin abscess and  abscess in presacral space.    Assessment & Plan:   Principal Problem:   Abscess of groin, left Active Problems:   Severe sepsis (HCC)   End-stage renal disease on hemodialysis (HCC)   Depression   Leukocytosis   Paroxysmal atrial fibrillation (HCC)   History of COPD   Chronic diastolic CHF (congestive heart failure) (HCC)   History of anemia due to chronic kidney disease   Abdominal wall abscess   ESBL E. coli carrier   Anastomotic leak of intestine   1-Sepsis secondary to infected groin abscess, and  Presacral space 5 x 4.5 cm fluid collection extending into left ischial anal fat slightly  increased from 06/11/2023 -Dr. Royal discussed with Dr. Stevie with general surgery on 07/10/2023--IR placed drain to be managed by Dr. Leigh COTA -ID following, awaiting culture data.   ESRD Monday Wednesday and Friday via [facility] complicated by hypotension Continue with midodrine  HD today to get him back on the schedule.   A-fib on Eliquis  Had paroxysmal A-fib started on IV amiodarone  subsequently transition to oral, amiodarone  dose increased to reduced to 200 due to mild bradycardia Follow up  outpatient with cardiology   Depression: Continue with Zoloft   Obstructive uropathy with chronic left PCN S/P exchange 2 weeks ago On IV antibiotics.   Ulcerative colitis status post colectomy 2011 with ileostomy, ileal pouch anal anastomosis surgery 2012, SBO History of rectal bleed secondary to gluteal cleft fistula- -discussed with Dr. Rollin GI--not a clear anoscopy/procedural operative candidate  - SBO, Vs Ilues;  Report abdominal pain.  Avoid narcotics.  KUB ileus Vs Obstruction.  Surgery re-consulted.    Previous DVT Not on anticoagulation.  CAD: LHC on 07/22/22 which showed no significant CAD and normal LVEDP.      Pressure Injury 07/08/23 Buttocks Bilateral Stage 1 -  Intact skin with non-blanchable redness of a localized area usually over a bony prominence. redness due to bleeding / pus wound (Active)  07/08/23 1454  Location: Buttocks  Location Orientation: Bilateral  Staging: Stage 1 -  Intact skin with non-blanchable redness of a localized area usually over a bony prominence.  Wound Description (Comments): redness due to bleeding / pus wound  Present on Admission: Yes  Dressing Type Foam - Lift dressing to assess site every shift 07/14/23 2052                  Estimated body mass index is 25.54 kg/m as calculated from the following:   Height as of this encounter: 5' 5 (1.651 m).   Weight as of this encounter: 69.6 kg.   DVT prophylaxis: Eliquis  Code Status: Full code Family Communication: daughter at bedside.  Disposition Plan:  Status is: Inpatient Remains inpatient appropriate because: management of infection  Consultants:  Sx ID   Procedures:  none  Antimicrobials:    Subjective: He is alert, report abdominal pain . No significant out put from ostomy.    Objective: Vitals:   07/15/23 0526 07/15/23 0528 07/15/23 0748 07/15/23 0753  BP: (!) 102/51   (!) 97/56  Pulse: 70 67  72  Resp: 20 17  17   Temp: 97.8 F (36.6 C)    (!) 97.4 F (36.3 C)  TempSrc: Oral   Oral  SpO2: 96% 97% 97% 98%  Weight:  69.6 kg    Height:        Intake/Output Summary (Last 24 hours) at 07/15/2023 0828 Last data filed at 07/15/2023 0600 Gross per 24 hour  Intake 655 ml  Output 255 ml  Net 400 ml   Filed Weights   07/14/23 0748 07/14/23 1113 07/15/23 0528  Weight: 65.1 kg 66.5 kg 69.6 kg    Examination:  General exam: Appears calm and comfortable  Respiratory system: Clear to auscultation. Respiratory effort normal. Cardiovascular system: S1 & S2 heard, RRR. No JVD, murmurs, rubs, gallops or clicks. No pedal edema. Gastrointestinal system: Abdomen is soft, distended, tender.  Central nervous system: Alert and oriented. Extremities: Symmetric 5 x 5 power.   Data Reviewed: I have personally reviewed following labs and imaging studies  CBC: Recent Labs  Lab 07/10/23 1120 07/11/23 0310 07/13/23 0658 07/14/23 0346 07/15/23 0358  WBC 14.0* 13.2* 11.8* 9.4 10.7*  HGB 9.3* 8.8* 9.0* 8.9* 8.7*  HCT 27.6* 25.9* 27.0* 26.6* 25.7*  MCV 122.7* 123.3* 123.9* 122.6* 123.0*  PLT 160 134* 143* 133* 124*   Basic Metabolic Panel: Recent Labs  Lab 07/11/23 0310 07/11/23 1702 07/12/23 0326 07/12/23 0927 07/13/23 0659 07/14/23 0346 07/15/23 0358  NA 134* 132*  --  132* 132* 132* 131*  K 4.8 5.5*  --  5.2* 5.7* 6.1* 4.6  CL 92* 90*  --  93* 90* 89* 88*  CO2 28 25  --  24 26 27 29   GLUCOSE 86 90  --  91 99 96 109*  BUN 26* 34*  --  45* 57* 70* 42*  CREATININE 5.80* 6.72*  --  8.13* 9.30* 10.22* 6.94*  CALCIUM  8.9 9.2  --  9.0 9.0 9.0 8.8*  MG  --   --  1.7  --   --  2.0  --   PHOS 5.8* 6.1*  --   --  7.0* 8.3* 6.3*   GFR: Estimated Creatinine Clearance: 6.8 mL/min (A) (by C-G formula based on SCr of 6.94 mg/dL (H)). Liver Function Tests: Recent Labs  Lab 07/11/23 1702 07/13/23 0658 07/13/23 0659 07/14/23 0346 07/15/23 0358  AST  --  13*  --   --   --   ALT  --  13  --   --   --   ALKPHOS  --  73  --   --    --   BILITOT  --  0.7  --   --   --   PROT  --  6.4*  --   --   --   ALBUMIN  3.5 2.9* 2.9* 2.7* 2.7*   No results for input(s): LIPASE, AMYLASE in the last 168 hours. No results for input(s): AMMONIA in the last 168 hours. Coagulation Profile: Recent Labs  Lab 07/13/23 0658  INR 1.1   Cardiac Enzymes: No results for input(s): CKTOTAL, CKMB, CKMBINDEX, TROPONINI in the last 168 hours. BNP (last 3 results) No results for input(s): PROBNP in the  last 8760 hours. HbA1C: No results for input(s): HGBA1C in the last 72 hours. CBG: Recent Labs  Lab 07/09/23 1229 07/13/23 2104 07/14/23 0602  GLUCAP 96 114* 81   Lipid Profile: No results for input(s): CHOL, HDL, LDLCALC, TRIG, CHOLHDL, LDLDIRECT in the last 72 hours. Thyroid Function Tests: No results for input(s): TSH, T4TOTAL, FREET4, T3FREE, THYROIDAB in the last 72 hours. Anemia Panel: No results for input(s): VITAMINB12, FOLATE, FERRITIN, TIBC, IRON, RETICCTPCT in the last 72 hours. Sepsis Labs: No results for input(s): PROCALCITON, LATICACIDVEN in the last 168 hours.  Recent Results (from the past 240 hours)  Blood culture (routine x 2)     Status: None   Collection Time: 07/07/23  2:07 PM   Specimen: BLOOD  Result Value Ref Range Status   Specimen Description BLOOD RIGHT ANTECUBITAL  Final   Special Requests   Final    BOTTLES DRAWN AEROBIC AND ANAEROBIC Blood Culture adequate volume   Culture   Final    NO GROWTH 5 DAYS Performed at [facility], [address], [place], [place] [address]    Report Status 07/12/2023 FINAL  Final  Blood culture (routine x 2)     Status: None   Collection Time: 07/07/23  2:08 PM   Specimen: BLOOD LEFT FOREARM  Result Value Ref Range Status   Specimen Description BLOOD LEFT FOREARM  Final   Special Requests   Final    BOTTLES DRAWN AEROBIC ONLY Blood Culture results may not be optimal due to an inadequate volume of blood  received in culture bottles   Culture   Final    NO GROWTH 5 DAYS Performed at [facility], [address], [place], [place] [address]    Report Status 07/12/2023 FINAL  Final  Resp panel by RT-PCR (RSV, Flu A&B, Covid) Anterior Nasal Swab     Status: None   Collection Time: 07/07/23  3:46 PM   Specimen: Anterior Nasal Swab  Result Value Ref Range Status   SARS Coronavirus 2 by RT PCR NEGATIVE NEGATIVE Final   Influenza A by PCR NEGATIVE NEGATIVE Final   Influenza B by PCR NEGATIVE NEGATIVE Final    Comment: (NOTE) The Xpert Xpress SARS-CoV-2/FLU/RSV plus assay is intended as an aid in the diagnosis of influenza from Nasopharyngeal swab specimens and should not be used as a sole basis for treatment. Nasal washings and aspirates are unacceptable for Xpert Xpress SARS-CoV-2/FLU/RSV testing.  Fact Sheet for Patients: bloggercourse.com  Fact Sheet for Healthcare Providers: seriousbroker.it  This test is not yet approved or cleared by the [place]  FDA and has been authorized for detection and/or diagnosis of SARS-CoV-2 by FDA under an Emergency Use Authorization (EUA). This EUA will remain in effect (meaning this test can be used) for the duration of the COVID-19 declaration under Section 564(b)(1) of the Act, 21 U.S.C. section 360bbb-3(b)(1), unless the authorization is terminated or revoked.     Resp Syncytial Virus by PCR NEGATIVE NEGATIVE Final    Comment: (NOTE) Fact Sheet for Patients: bloggercourse.com  Fact Sheet for Healthcare Providers: seriousbroker.it  This test is not yet approved or cleared by the [place]  FDA and has been authorized for detection and/or diagnosis of SARS-CoV-2 by FDA under an Emergency Use Authorization (EUA). This EUA will remain in effect (meaning this test can be used) for the duration of the COVID-19 declaration under Section  564(b)(1) of the Act, 21 U.S.C. section 360bbb-3(b)(1), unless the authorization is terminated or revoked.  Performed at [facility], [address], [place], [place] [address]   Aerobic/Anaerobic Culture w Gram Stain (surgical/deep wound)     Status: None (Preliminary result)   Collection Time: 07/13/23  1:09 PM   Specimen: Abscess  Result Value Ref Range Status   Specimen Description ABSCESS  Final   Special Requests Normal  Final   Gram Stain   Final    ABUNDANT WBC PRESENT, PREDOMINANTLY PMN RARE GRAM POSITIVE COCCI RARE GRAM NEGATIVE RODS    Culture   Final    ABUNDANT GRAM NEGATIVE RODS CULTURE REINCUBATED FOR BETTER GROWTH SUSCEPTIBILITIES TO FOLLOW Performed at [facility], [address], [place], [place] [address]    Report Status PENDING  Incomplete         Radiology Studies: CT GUIDED VISCERAL FLUID DRAIN BY PERC CATH Result Date: 07/13/2023 INDICATION: 88 year old male with a history of chronic recurrent presacral abscess. He presents for repeat drain placement. EXAM: CT-guided drain placement MEDICATIONS: The patient is currently admitted to the hospital and receiving intravenous antibiotics. The antibiotics were administered within an appropriate time frame prior to the initiation of the procedure. ANESTHESIA/SEDATION: Moderate (conscious) sedation was employed during this procedure. A total of Versed  2 mg and Fentanyl  100 mcg was administered intravenously by the radiology nurse. Total intra-service moderate Sedation Time: 13 minutes. The patient's level of consciousness and vital signs were monitored continuously by radiology nursing throughout the procedure under my direct supervision. COMPLICATIONS: None immediate. PROCEDURE: Informed written consent was obtained from the patient after a thorough discussion of the procedural risks, benefits and alternatives. All questions were addressed. Maximal Sterile Barrier Technique was utilized including caps,  mask, sterile gowns, sterile gloves, sterile drape, hand hygiene and skin antiseptic. A timeout was performed prior to the initiation of the procedure. A planning axial CT scan was performed. The complex presacral fluid collection is identified. A suitable skin entry site was selected and marked. The overlying skin was sterilely prepped and draped in the standard fashion using chlorhexidine  skin prep. Local anesthesia was attained by infiltration with 1% lidocaine . A small dermatotomy was made. Under intermittent CT guidance, an 18 gauge trocar needle was advanced along a left parasacral transgluteal approach into the fluid collection. A 0.035 wire was then coiled in the fluid collection. The needle was removed. The percutaneous tract was dilated to 12 French. A Cook 12 French all-purpose drainage catheter was advanced over the wire and formed. Aspiration yields approximately 75 mL thick purulent fluid. Postoperative CT imaging demonstrates a well-positioned drainage catheter. Of note, there is a vacuum space created by aspiration of the fluid without collapse. This is consistent with a chronic cavity. The drainage catheter was flushed and connected to JP bulb suction before being secured to the skin with 0 Prolene suture. IMPRESSION: 1. Successful placement of a 12 French drainage catheter via a left transgluteal approach with evacuation of 75 mL thick purulent fluid. A sample was sent for Gram stain and culture. 2. Postoperative CT imaging demonstrates a vacuum space within the evacuated thick walled cavity rather than collapse of the cavity. This suggesting a chronic space which will be prone to recurrent abscess formation. Electronically Signed   By: Wilkie Lent M.D.   On: 07/13/2023 13:35        Scheduled Meds:  (feeding supplement) PROSource Plus  30 mL Oral BID BM   amiodarone   200 mg Oral BID   apixaban   2.5 mg Oral BID   Chlorhexidine  Gluconate  Cloth  6 each Topical Q0600   darbepoetin  (ARANESP ) injection - DIALYSIS  100 mcg Subcutaneous Q Tue-1800   dorzolamide   1 drop Both Eyes BID   Gerhardt's butt cream   Topical BID   latanoprost   1 drop Right Eye QHS   magnesium  oxide  400 mg Oral BID   midodrine   10 mg Oral TID WC   mometasone -formoterol   2 puff Inhalation BID   sertraline   25 mg Oral QHS   sevelamer  carbonate  2,400 mg Oral TID WC   sodium chloride  flush  5 mL Intracatheter Q8H   sodium zirconium cyclosilicate   10 g Oral Daily   sorbitol   30 mL Oral Once   timolol   1 drop Both Eyes Daily   Continuous Infusions:  meropenem  (MERREM ) IV 1 g (07/14/23 1820)     LOS: 8 days    Time spent: 35 Minutes    Kessler Solly A Kaydynce Pat, MD [facility]   If 7PM-7AM, please contact night-coverage [email or web address]  07/15/2023, 8:28 AM

## 2023-07-15 NOTE — Progress Notes (Addendum)
 Known to our service, recently seen by us  as recently as 07/13/2023.   ESRD on HD (last session this AM), hypotension on midodrine , history of UC s/p multiple abdominal operations now s/p total proctocolectomy/end ileostomy complicated by ureteral injuries and stenoses requiring bilateral PCNs now with left PCN only most recently exchanged 12/19, multiple SBOs which resolved non-operatively, history of DVT, Afib on Eliquis    Underwent IR drainage of presacral fluid collection - based on discussions/recommendations made per Dr. Stevie by Dr. Leigh at [facility].   He was noted to be doing well on 07/13/23 by our service. We are consulted today to see back for possible ileus vs SBO  Subjective He reports ~1-2d of decreased ileostomy output and vague crampy abdominal discomfort. Had 1 episode of emesis earlier today following ensure. Currently reports no nausea nor recent emesis. States he has vague bloaty type discomfort across abdomen.  Objective: Vital signs in last 24 hours: Temp:  [97.4 F (36.3 C)-98.3 F (36.8 C)] 97.8 F (36.6 C) (01/08 1551) Pulse Rate:  [63-74] 74 (01/08 1551) Resp:  [16-20] 20 (01/08 1551) BP: (82-102)/(46-60) 94/57 (01/08 1551) SpO2:  [96 %-99 %] 97 % (01/08 1551) Weight:  [69.6 kg] 69.6 kg (01/08 0528) Last BM Date : 07/13/23  Intake/Output from previous day: 01/07 0701 - 01/08 0700 In: 655 [P.O.:630; I.V.:10] Out: 270 [Urine:10; Drains:60; Stool:100] Intake/Output this shift: No intake/output data recorded.  Gen: NAD, comfortable CV: RRR Pulm: Normal work of breathing Abd: Soft, not significantly tender; ileostomy with ~100 cc in appliance of toothpaste consistency stool. No parastomal tenderness or incarcerated feeling masses  Ext: SCDs in place  Lab Results: CBC  Recent Labs    07/14/23 0346 07/15/23 0358  WBC 9.4 10.7*  HGB 8.9* 8.7*  HCT 26.6* 25.7*  PLT 133* 124*   BMET Recent Labs    07/14/23 0346 07/15/23 0358  NA 132* 131*  K 6.1*  4.6  CL 89* 88*  CO2 27 29  GLUCOSE 96 109*  BUN 70* 42*  CREATININE 10.22* 6.94*  CALCIUM  9.0 8.8*   PT/INR Recent Labs    07/13/23 0658  LABPROT 14.7  INR 1.1   ABG No results for input(s): PHART, HCO3 in the last 72 hours.  Invalid input(s): PCO2, PO2  Studies/Results:  Anti-infectives: Anti-infectives (From admission, onward)    Start     Dose/Rate Route Frequency Ordered Stop   07/13/23 1800  meropenem  (MERREM ) 1 g in sodium chloride  0.9 % 100 mL IVPB        1 g 200 mL/hr over 30 Minutes Intravenous Every 24 hours 07/13/23 1751     07/10/23 2200  linezolid  (ZYVOX ) tablet 600 mg  Status:  Discontinued        600 mg Oral Every 12 hours 07/10/23 0952 07/14/23 1411   07/08/23 2200  piperacillin -tazobactam (ZOSYN ) IVPB 2.25 g  Status:  Discontinued        2.25 g 100 mL/hr over 30 Minutes Intravenous Every 8 hours 07/08/23 2124 07/10/23 0952   07/08/23 0600  piperacillin -tazobactam (ZOSYN ) IVPB 3.375 g  Status:  Discontinued        3.375 g 12.5 mL/hr over 240 Minutes Intravenous Every 12 hours 07/08/23 0518 07/08/23 2124   07/08/23 0200  linezolid  (ZYVOX ) IVPB 600 mg  Status:  Discontinued        600 mg 300 mL/hr over 60 Minutes Intravenous Every 12 hours 07/07/23 2117 07/10/23 0952   07/07/23 2300  piperacillin -tazobactam (ZOSYN ) IVPB 2.25 g  Status:  Discontinued        2.25 g 100 mL/hr over 30 Minutes Intravenous Every 8 hours 07/07/23 2121 07/08/23 0517   07/07/23 1415  piperacillin -tazobactam (ZOSYN ) IVPB 3.375 g        3.375 g 100 mL/hr over 30 Minutes Intravenous  Once 07/07/23 1404 07/07/23 1555   07/07/23 1415  clindamycin  (CLEOCIN ) IVPB 900 mg  Status:  Discontinued        900 mg 100 mL/hr over 30 Minutes Intravenous  Once 07/07/23 1404 07/07/23 1414   07/07/23 1415  linezolid  (ZYVOX ) IVPB 600 mg        600 mg 300 mL/hr over 60 Minutes Intravenous STAT 07/07/23 1414 07/07/23 1911        Assessment/Plan: Patient Active Problem List    Diagnosis Date Noted   Abdominal wall abscess 07/14/2023   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   Anastomotic leak of intestine 07/14/2023   Leukocytosis 07/08/2023   Paroxysmal atrial fibrillation (HCC) 07/08/2023   History of COPD 07/08/2023   Chronic diastolic CHF (congestive heart failure) (HCC) 07/08/2023   History of anemia due to chronic kidney disease 07/08/2023   Abscess of groin, left 07/07/2023   Macrocytic anemia 04/08/2023   Depression 04/08/2023   Perirectal abscess 03/23/2023   Infection due to ESBL-producing Escherichia coli 03/23/2023   GI bleeding 10/07/2022   DOE (dyspnea on exertion) 07/22/2022   Coag negative Staphylococcus bacteremia 06/06/2022   Severe sepsis (HCC) 06/02/2022   Ulcerative colitis (HCC) 06/02/2022   End-stage renal disease on hemodialysis (HCC) 05/06/2022   SBO (small bowel obstruction) (HCC) 11/18/2021   Glaucoma 11/18/2021   Atrial fibrillation, persistent (HCC) 11/18/2021   Orthostatic hypotension 10/22/2021   Idiopathic neuropathy 10/22/2021   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   Attention to urostomy (HCC) 10/22/2021   Ileus vs pSBO - NPO, MIVF per primary/nephro Now having more ileostomy output and has no nausea/vomiting at present. If develops additional n/v, would place NG  Would also obtain CT Abd/pelvis with contrast for further evaluation if cleared for this by nephrology   L groin abscess S/p I&D 12/31 Dr. Ann in clinic    Presacral fluid collection I have reviewed his chart extensively about the presacral fluid collection. It had a drain in it 4 months ago and was treated for infection, it was separated after drain dislodgement which was sterile at the time. On imaging now it is larger. It is not near intestine. It does not appear to track to the left groin. I have reached out to Dr. Leigh, at [facility] who is managing this, to try to coordinate best plans for care of this complex condition. By Dr. Stevie.  Dr. Leigh has recommended  IR drain which appears to be taking place today.   -patient will need to follow up with Dr. Leigh at [facility] for this problem     -we will arrange follow up in our office for a wound check for his groin wound in about 3 weeks.   -no further surgical intervention warranted at this time.  We will be available as needed.   Pressure injury to sacrum -frequent turns to offload the patient -cont currently treatment otherwise   FEN: NPO, MIVF per primary/nephro ID: on Merrem  per ID VTE: eliquis  on hold for IR procedure.  May resume post procedure when IR oks this.   LOS: 8 days   I spent a total of 75 minutes in both face-to-face and non-face-to-face activities, excluding procedures performed, for this visit on the  date of this encounter   Lonni Pizza, MD [facility], [facility]

## 2023-07-15 NOTE — Progress Notes (Signed)
 [facility] Progress Note   Subjective:  Seen in room - uncomfortable in chair - sacral pain. No CP/dyspnea. For HD today to get him back on usual MWF schedule.   Objective Vitals:   07/15/23 0526 07/15/23 0528 07/15/23 0748 07/15/23 0753  BP: (!) 102/51   (!) 97/56  Pulse: 70 67  72  Resp: 20 17  17   Temp: 97.8 F (36.6 C)   (!) 97.4 F (36.3 C)  TempSrc: Oral   Oral  SpO2: 96% 97% 97% 98%  Weight:  69.6 kg    Height:       Physical Exam General: Well appearing man, NAD. Room air. Heart: RRR; no murmur Lungs: CTA anteriorly Abdomen: soft.  Back: JP drain with serosanguinous fluid in bulb, nephrostomy bag with cloudy yellow urine from back. Extremities: no LE edema Dialysis Access:  L thigh AVG + t/b  Additional Objective Labs: Basic Metabolic Panel: Recent Labs  Lab 07/13/23 0659 07/14/23 0346 07/15/23 0358  NA 132* 132* 131*  K 5.7* 6.1* 4.6  CL 90* 89* 88*  CO2 26 27 29   GLUCOSE 99 96 109*  BUN 57* 70* 42*  CREATININE 9.30* 10.22* 6.94*  CALCIUM  9.0 9.0 8.8*  PHOS 7.0* 8.3* 6.3*   Liver Function Tests: Recent Labs  Lab 07/13/23 0658 07/13/23 0659 07/14/23 0346 07/15/23 0358  AST 13*  --   --   --   ALT 13  --   --   --   ALKPHOS 73  --   --   --   BILITOT 0.7  --   --   --   PROT 6.4*  --   --   --   ALBUMIN  2.9* 2.9* 2.7* 2.7*   CBC: Recent Labs  Lab 07/10/23 1120 07/11/23 0310 07/13/23 0658 07/14/23 0346 07/15/23 0358  WBC 14.0* 13.2* 11.8* 9.4 10.7*  HGB 9.3* 8.8* 9.0* 8.9* 8.7*  HCT 27.6* 25.9* 27.0* 26.6* 25.7*  MCV 122.7* 123.3* 123.9* 122.6* 123.0*  PLT 160 134* 143* 133* 124*   Blood Culture    Component Value Date/Time   SDES ABSCESS 07/13/2023 1309   SPECREQUEST Normal 07/13/2023 1309   CULT  07/13/2023 1309    ABUNDANT GRAM NEGATIVE RODS CULTURE REINCUBATED FOR BETTER GROWTH SUSCEPTIBILITIES TO FOLLOW Performed at [facility], [address] [place], [place] [address]    REPTSTATUS PENDING  07/13/2023 1309   Studies/Results: CT GUIDED VISCERAL FLUID DRAIN BY PERC CATH Result Date: 07/13/2023 INDICATION: 88 year old male with a history of chronic recurrent presacral abscess. He presents for repeat drain placement. EXAM: CT-guided drain placement MEDICATIONS: The patient is currently admitted to the hospital and receiving intravenous antibiotics. The antibiotics were administered within an appropriate time frame prior to the initiation of the procedure. ANESTHESIA/SEDATION: Moderate (conscious) sedation was employed during this procedure. A total of Versed  2 mg and Fentanyl  100 mcg was administered intravenously by the radiology nurse. Total intra-service moderate Sedation Time: 13 minutes. The patient's level of consciousness and vital signs were monitored continuously by radiology nursing throughout the procedure under my direct supervision. COMPLICATIONS: None immediate. PROCEDURE: Informed written consent was obtained from the patient after a thorough discussion of the procedural risks, benefits and alternatives. All questions were addressed. Maximal Sterile Barrier Technique was utilized including caps, mask, sterile gowns, sterile gloves, sterile drape, hand hygiene and skin antiseptic. A timeout was performed prior to the initiation of the procedure. A planning axial CT scan was performed. The complex presacral  fluid collection is identified. A suitable skin entry site was selected and marked. The overlying skin was sterilely prepped and draped in the standard fashion using chlorhexidine  skin prep. Local anesthesia was attained by infiltration with 1% lidocaine . A small dermatotomy was made. Under intermittent CT guidance, an 18 gauge trocar needle was advanced along a left parasacral transgluteal approach into the fluid collection. A 0.035 wire was then coiled in the fluid collection. The needle was removed. The percutaneous tract was dilated to 12 French. A Cook 12 French all-purpose drainage  catheter was advanced over the wire and formed. Aspiration yields approximately 75 mL thick purulent fluid. Postoperative CT imaging demonstrates a well-positioned drainage catheter. Of note, there is a vacuum space created by aspiration of the fluid without collapse. This is consistent with a chronic cavity. The drainage catheter was flushed and connected to JP bulb suction before being secured to the skin with 0 Prolene suture. IMPRESSION: 1. Successful placement of a 12 French drainage catheter via a left transgluteal approach with evacuation of 75 mL thick purulent fluid. A sample was sent for Gram stain and culture. 2. Postoperative CT imaging demonstrates a vacuum space within the evacuated thick walled cavity rather than collapse of the cavity. This suggesting a chronic space which will be prone to recurrent abscess formation. Electronically Signed   By: Wilkie Lent M.D.   On: 07/13/2023 13:35   Medications:  meropenem  (MERREM ) IV 1 g (07/14/23 1820)    (feeding supplement) PROSource Plus  30 mL Oral BID BM   amiodarone   200 mg Oral BID   apixaban   2.5 mg Oral BID   Chlorhexidine  Gluconate Cloth  6 each Topical Q0600   darbepoetin (ARANESP ) injection - DIALYSIS  100 mcg Subcutaneous Q Tue-1800   dorzolamide   1 drop Both Eyes BID   Gerhardt's butt cream   Topical BID   latanoprost   1 drop Right Eye QHS   magnesium  oxide  400 mg Oral BID   midodrine   10 mg Oral TID WC   mometasone -formoterol   2 puff Inhalation BID   sertraline   25 mg Oral QHS   sevelamer  carbonate  2,400 mg Oral TID WC   sodium chloride  flush  5 mL Intracatheter Q8H   sodium zirconium cyclosilicate   10 g Oral Daily   timolol   1 drop Both Eyes Daily    Dialysis Orders MWF - NW 2:45hr, 350/A1.5, EDW 68.5kg, 2K/2.5Ca, AVG, heparin  5000 unit bolus - Mircera 120mcg IV q 4 weeks (last 12/18) - Calcitriol  0.48mcg PO q HD   Assessment/Plan: Sepsis/groin abscess: S/p I&D 12/31, Blood Cx negative. On Linezolid . Sacral  wound/fluid collection: Drain placed by IR 1/6, Cx growing GNR. On Meropenem . ESRD: Usual MWF schedule - s/p HD Tuesday as rollover, for HD again today to get back on usual MWF schedule. On Lokelma  10g daily for recurrrent hyperK at this time. HypoTN/volume: BP low/stable. On mido 10mg  TID, UF as tolerated. Anemia of ESRD: Hgb 8.7, Aranesp  100mcg given 1/7 - continue q Tuesday during admit.  Secondary HPTH: CorrCa improving, VDRA remains on hold. Phos high (improving) - continue Renvela  and monitor for now (not given intermittently). Nutrition: Alb low, continue supplements. A-fib: Hx RVR, better. On amiodarone  + Eliquis .   Izetta Boehringer, PA-C 07/15/2023, 10:30 AM  [facility]

## 2023-07-15 NOTE — Evaluation (Signed)
 Physical Therapy Evaluation Patient Details Name: Joshua Riley MRN: [id number] DOB: February 14, 1936 Today's Date: 07/15/2023  History of Present Illness  Joshua Riley is a 88 y.o. male who is admitted to [facility] on 07/07/2023 with severe sepsis due to left groin abscess  on 1/4 pt underwent aspiration and drain placement within the presacral absces sPMH: end-stage renal disease on HD, MWF, ulcerative colitis status post total proctocolectomy with diverting ileostomy, paroxysmal atrial fibrillation chronically anticoagulated on Eliquis , COPD, chronic diastolic heart failure, anemia of chronic disease associated baseline hemoglobin 8-11,   Clinical Impression  Pt admitted with above. PTA pt lives alone, drives, and is indep with ADLs up until recently when he began using a RW due to pain at sight of groin abscess. Pt mobility limited by orthostatic BP, abdominal pain, generalized weakness, and impaired balance. At this time pt unsafe to return home however I am optimistic once pain and infection are under better control patient will progress well and d/c home with [facility] services. May need to look at inpatient rehab program < 3 hrs a day if pt with minimal progression and no available support. Acute PT to cont to follow.      If plan is discharge home, recommend the following: A little help with walking and/or transfers;A little help with bathing/dressing/bathroom;Assist for transportation   Can travel by private vehicle        Equipment Recommendations None recommended by PT (reports he has a rolling walker)  Recommendations for Other Services       Functional Status Assessment Patient has had a recent decline in their functional status and demonstrates the ability to make significant improvements in function in a reasonable and predictable amount of time.     Precautions / Restrictions Precautions Precautions: Fall Precaution Comments: jp drain, nephrostomy  tube Restrictions Weight Bearing Restrictions Per Provider Order: No      Mobility  Bed Mobility Overal bed mobility: Needs Assistance Bed Mobility: Supine to Sit     Supine to sit: Min assist     General bed mobility comments: pt initiated LE movement off EOB, minA for trunk elevation    Transfers Overall transfer level: Needs assistance Equipment used: Rolling walker (2 wheels) Transfers: Sit to/from Stand Sit to Stand: Min assist           General transfer comment: minA to power up, verbal cues to push from bed, onset of dizziness requiring pt to return to sitting. BP taken and it dropped from 97/56 to 70/54    Ambulation/Gait Ambulation/Gait assistance: Contact guard assist Gait Distance (Feet): 10 Feet Assistive device: Rolling walker (2 wheels) Gait Pattern/deviations: Step-through pattern, Decreased stride length, Trunk flexed Gait velocity: impulsively quick Gait velocity interpretation: <1.31 ft/sec, indicative of household ambulator   General Gait Details: pt impulsively quick, denied dizziness until pt sat down, BP dropped to 60/48, once LEs elevated and chair reclined, BP 82/54  Stairs            Wheelchair Mobility     Tilt Bed    Modified Rankin (Stroke Patients Only)       Balance Overall balance assessment: Needs assistance Sitting-balance support: Feet supported, No upper extremity supported Sitting balance-Leahy Scale: Fair     Standing balance support: Bilateral upper extremity supported, During functional activity, Reliant on assistive device for balance Standing balance-Leahy Scale: Poor Standing balance comment: dependent on external assist  Pertinent Vitals/Pain Pain Assessment Pain Assessment: Faces Faces Pain Scale: Hurts even more Pain Location: abdomen Pain Descriptors / Indicators: Cramping, Grimacing Pain Intervention(s): Monitored during session    Home Living  Family/patient expects to be discharged to:: Private residence Living Arrangements: Alone Available Help at Discharge: Family;Available PRN/intermittently Type of Home: Apartment Home Access: Level entry       Home Layout: One level Home Equipment: Agricultural Consultant (2 wheels)      Prior Function Prior Level of Function : Independent/Modified Independent;Driving             Mobility Comments: was indep up until a few days PTA when he was needing to use RW due to pain ADLs Comments: indep up until recently due to pain     Extremity/Trunk Assessment   Upper Extremity Assessment Upper Extremity Assessment: Generalized weakness    Lower Extremity Assessment Lower Extremity Assessment: Generalized weakness    Cervical / Trunk Assessment Cervical / Trunk Assessment:  (cervical flexion, difficulty holding head up)  Communication   Communication Communication: Hearing impairment (has hearing aides but wouldn't put them in) Cueing Techniques: Verbal cues;Gestural cues;Tactile cues  Cognition Arousal: Alert Behavior During Therapy:  (irritated but became engaged with this PT after connection of being from [place]) Overall Cognitive Status: Within Functional Limits for tasks assessed                                 General Comments: pt with depressed spirits, easily irritated however aware pt is in a lot of pain, is uncomfortable, and frustrated wtih decline in mobility. Pt responded well to this therapist as both therapist and patient are from [place] in which he appeared to find comfort in because pt instantly became calm and worked with therapist        General Comments General comments (skin integrity, edema, etc.): pt with noted skin abrasion on upper back, RN notified, orthostatic BP dropping as low as 60/48    Exercises     Assessment/Plan    PT Assessment Patient needs continued PT services  PT Problem List Decreased strength;Decreased activity  tolerance;Decreased balance;Decreased mobility;Decreased knowledge of use of DME;Decreased safety awareness;Pain;Decreased skin integrity       PT Treatment Interventions DME instruction;Gait training;Functional mobility training;Therapeutic activities;Therapeutic exercise;Balance training    PT Goals (Current goals can be found in the Care Plan section)  Acute Rehab PT Goals Patient Stated Goal: go poop PT Goal Formulation: With patient Time For Goal Achievement: 07/29/23 Potential to Achieve Goals: Fair    Frequency Min 1X/week     Co-evaluation               AM-PAC PT 6 Clicks Mobility  Outcome Measure Help needed turning from your back to your side while in a flat bed without using bedrails?: A Little Help needed moving from lying on your back to sitting on the side of a flat bed without using bedrails?: A Little Help needed moving to and from a bed to a chair (including a wheelchair)?: A Little Help needed standing up from a chair using your arms (e.g., wheelchair or bedside chair)?: A Little Help needed to walk in hospital room?: A Little Help needed climbing 3-5 steps with a railing? : A Lot 6 Click Score: 17    End of Session Equipment Utilized During Treatment: Gait belt Activity Tolerance: Patient limited by fatigue Patient left: in chair;with call bell/phone within reach;with chair  alarm set Nurse Communication: Mobility status (orthostatic BP, skin tear on upper back) PT Visit Diagnosis: Unsteadiness on feet (R26.81);Muscle weakness (generalized) (M62.81)    Time: 9170-9142 PT Time Calculation (min) (ACUTE ONLY): 28 min   Charges:   PT Evaluation $PT Eval Low Complexity: 1 Low PT Treatments $Gait Training: 8-22 mins PT General Charges $$ ACUTE PT VISIT: 1 Visit         Norene Ames, PT, DPT Acute Rehabilitation Services Secure chat preferred Office #: [phone]   Norene CHRISTELLA Ames 07/15/2023, 10:47 AM

## 2023-07-15 NOTE — Plan of Care (Signed)

## 2023-07-15 NOTE — Progress Notes (Signed)
 Due to high dialysis census, patient will be moved tomorrow AM shift (1/9). [place]

## 2023-07-15 NOTE — Progress Notes (Signed)
 PHARMACY - ANTICOAGULATION CONSULT NOTE  Pharmacy Consult for heparin  Indication: atrial fibrillation  Allergies  Allergen Reactions   Cephalosporins Rash   Ciprofloxacin Itching and Rash   Baclofen Other (See Comments)    Altered mental status, after accidental overdose      Patient Measurements: Height: 5' 5 (165.1 cm) Weight: 69.6 kg (153 lb 8 oz) IBW/kg (Calculated) : 61.5 HEPARIN  DW (KG): 67.1   Vital Signs: Temp: 97.8 F (36.6 C) (01/08 1551) Temp Source: Oral (01/08 1551) BP: 94/57 (01/08 1551) Pulse Rate: 74 (01/08 1551)  Labs: Recent Labs    07/13/23 0658 07/13/23 0659 07/14/23 0127 07/14/23 0346 07/14/23 1429 07/15/23 0358  HGB 9.0*  --   --  8.9*  --  8.7*  HCT 27.0*  --   --  26.6*  --  25.7*  PLT 143*  --   --  133*  --  124*  APTT 44*  --   --   --   --   --   LABPROT 14.7  --   --   --   --   --   INR 1.1  --   --   --   --   --   HEPARINUNFRC 0.18*  --  0.37  --  0.31  --   CREATININE  --  9.30*  --  10.22*  --  6.94*    Estimated Creatinine Clearance: 6.8 mL/min (A) (by C-G formula based on SCr of 6.94 mg/dL (H)).   Medical History: 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     Ileostomy in place [facility])    Obstructive uropathy    With chronic left nephrostomy   Old retinal detachment, total or subtotal    Orthostatic hypotension 10/22/2021   Sleep apnea    does not need a machine   Stroke (HCC)    Ulcerative colitis (HCC)    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    Medications:  Scheduled:   (feeding supplement) PROSource Plus  30 mL Oral BID BM   acyclovir  ointment    Topical Q3H   amiodarone   200 mg Oral BID   Chlorhexidine  Gluconate Cloth  6 each Topical Q0600   darbepoetin (ARANESP ) injection - DIALYSIS  100 mcg Subcutaneous Q Tue-1800   dorzolamide   1 drop Both Eyes BID   Gerhardt's butt cream   Topical BID   latanoprost   1 drop Right Eye QHS   magnesium  oxide  400 mg Oral BID   midodrine   10 mg Oral TID WC   mometasone -formoterol   2 puff Inhalation BID   sertraline   25 mg Oral QHS   sevelamer  carbonate  2,400 mg Oral TID WC   sodium chloride  flush  5 mL Intracatheter Q8H   sodium zirconium cyclosilicate   10 g Oral Daily   timolol   1 drop Both Eyes Daily    Assessment: 88 yo male with infected groin abscess on antibiotics and with afib and ESRD on apixaban  (last dose 1/8 ~ 10am).  He is noted with SBO vs ileus and pharmacy consulted to dose heparin .  -hg= 8.7, plt= 124  Goal of Therapy:  Heparin  level 0.3-0.7 units/ml aPTT 66-102 seconds Monitor platelets by anticoagulation protocol: Yes   Plan:  -Start heparin  950 units/hr at 10pm -aPTT and heparin  level in 8 hrs -Daily heparin  level, aPTT and CBC  Prentice Poisson, PharmD Clinical Pharmacist **Pharmacist phone directory can now be found on [email or web address] ([email or web address]).  Listed under [facility].

## 2023-07-15 NOTE — Progress Notes (Signed)
 Referring Provider(s): Kinsinger, Herlene, MD   Supervising Physician: Alona Corners  Patient Status:  [facility] - In-pt  Chief Complaint:  Abscess drain follow up  Brief History:  Joshua Riley is a 88 y.o. 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  He presented to the ED 07/07/23 with a eft groin abscess s/p I&D that same day as an outpatient as well as rectal bleeding, perirectal abscess and dyspnea.   He had been seen by general surgery as an outpatient earlier that day and underwent left groin I&D and packing, he was then directed to the ED for admission for IV antibiotics, blood cultures and evaluation of pre-sacral fluid collection.   CT scan showed:  Chronic irregular thick walled presacral space 5.7 x 4.5 cm fluid collection, extending into the left ischio-anal fat, slightly increased in size since 06/11/2023 CT.    CCS has been following left groin abscess as well as pre-sacral fluid collection of undetermined etiology (felt possibly due to sinus tract infection from surgery, urinoma, retained rectal mucosa, anastomotic leak).   Per chart review patient has been receiving majority of his care at [facility] and he had a drain placed within this collection in August of this year that became dislodged at some point and was not replaced.   The collection was monitored and noted to be increasing in October of this year.   [facility] aspirated this collection but was unable to place a drain.  [facility]/general surgery felt there were no further surgical options.   He underwent placement of a transgluteal drain by Dr. Karalee on 07/13/23.  Subjective:  Nauseated and vomiting on my arrival to the room. I asked if he needed something for nausea but he didn't request anything.  Allergies: Cephalosporins, Ciprofloxacin, and  Baclofen  Medications: Prior to Admission medications   Medication Sig Start Date End Date Taking? Authorizing Provider  acetaminophen  (TYLENOL ) 500 MG tablet Take 1,000 mg by mouth as needed for mild pain (pain score 1-3).   Yes [provider]  albuterol  (PROVENTIL ) (2.5 MG/3ML) 0.083% nebulizer solution Take 2.5 mg by nebulization 2 (two) times daily. 06/17/23  Yes [provider]  BIOTIN PO Take 1 capsule by mouth daily.   Yes [provider]  budesonide  (PULMICORT ) 0.5 MG/2ML nebulizer solution TAKE 2 ML (0.5 MG TOTAL) BY NEBULIZATION TWICE A DAY 06/29/23  Yes Hope Almarie ORN, NP  Cyanocobalamin  (B-12) 1000 MCG SUBL Place 1 tablet under the tongue daily at 6 (six) AM. 04/08/23  Yes Jesus Bernardino MATSU, MD  dorzolamide  (TRUSOPT ) 2 % ophthalmic solution Place 1 drop into both eyes 2 (two) times daily. 06/17/23  Yes [provider]  ELIQUIS  2.5 MG TABS tablet Take 1 tablet (2.5 mg total) by mouth 2 (two) times daily. 08/07/22  Yes Hobart Powell BRAVO, MD  latanoprost  (XALATAN ) 0.005 % ophthalmic solution Place 1 drop into the right eye at bedtime.   Yes [provider]  midodrine  (PROAMATINE ) 10 MG tablet Take 1 tablet (10 mg total) by mouth 3 (three) times daily. Takes 5 mg plus 10mg  for 15 mg total three times a day- started by nephrology in [place]  08/07/22  Yes Pemberton, Powell BRAVO, MD  midodrine  (PROAMATINE ) 5 MG tablet TAKE 1 TABLET 3 TIMES DAILY WITH MEALS. TAKES 5 MG PLUS 10MG  FOR 15 MG TOTAL THREE TIMES A DAY- STARTED BY NEPHROLOGY IN  [place]  10/20/22  Yes Katrinka Garnette KIDD, MD  Omega-3 Fatty Acids (OMEGA-3 FISH OIL) 500 MG CAPS Take 1 capsule by mouth daily.   Yes [provider]  riboflavin (VITAMIN B-2) 100 MG TABS tablet Take 100 mg by mouth daily.   Yes [provider]  sertraline  (ZOLOFT ) 25 MG tablet Take 1 tablet (25 mg total) by mouth daily. Patient taking differently: Take 25 mg by mouth at bedtime. 04/08/23  Yes  Jesus Bernardino MATSU, MD  sevelamer  carbonate (RENVELA ) 800 MG tablet Take 2,400 mg by mouth 3 (three) times daily with meals.   Yes [provider]  timolol  (TIMOPTIC ) 0.5 % ophthalmic solution Place 1 drop into both eyes daily.   Yes [provider]  traMADol  (ULTRAM ) 50 MG tablet Take 1 tablet (50 mg total) by mouth every 8 (eight) hours as needed (prn pain). 06/26/23  Yes Hudnell, Corean, NP  WIXELA INHUB 250-50 MCG/ACT AEPB Inhale 1 puff into the lungs 2 (two) times daily. 07/04/23  Yes [provider]  Albuterol  Sulfate 2.5 MG/0.5ML NEBU Inhale 0.5 mLs (2.5 mg total) into the lungs every 6 (six) hours. 07/15/23   Hope Almarie ORN, NP  ramelteon  (ROZEREM ) 8 MG tablet TAKE 1 TABLET BY MOUTH AT BEDTIME. 07/09/23   Katrinka Garnette KIDD, MD     Vital Signs: BP (!) 82/46 (BP Location: Left Arm)   Pulse 65   Temp (!) 97.5 F (36.4 C) (Oral)   Resp 18   Ht 5' 5 (1.651 m)   Wt 153 lb 8 oz (69.6 kg)   SpO2 96%   BMI 25.54 kg/m   Physical Exam Constitutional:      Appearance: Normal appearance.  HENT:     Head: Normocephalic and atraumatic.  Cardiovascular:     Rate and Rhythm: Normal rate.  Pulmonary:     Effort: Pulmonary effort is normal. No respiratory distress.  Abdominal:     Palpations: Abdomen is soft.     Tenderness: There is no abdominal tenderness.  Skin:    General: Skin is warm and dry.  Neurological:     General: No focal deficit present.     Mental Status: He is alert and oriented to person, place, and time.  Psychiatric:        Mood and Affect: Mood normal.        Behavior: Behavior normal.        Thought Content: Thought content normal.        Judgment: Judgment normal.   Drain Location: Transgluteal Size: Fr size: 12 Fr Date of placement: 07/13/23 Currently to: Drain collection device: suction bulb 24 hour output:  Output by Drain (mL) 07/13/23 0701 - 07/13/23 1900 07/13/23 1901 - 07/14/23 0700 07/14/23 0701 - 07/14/23 1900 07/14/23  1901 - 07/15/23 0700 07/15/23 0701 - 07/15/23 1350  Closed System Drain Left Buttock Bulb (JP) 12 Fr. 15 20 40 20     Current examination: Flushes/aspirates easily.  Insertion site unremarkable. Suture and stat lock in place. Dressed appropriately.     Labs:  CBC: Recent Labs    07/11/23 0310 07/13/23 0658 07/14/23 0346 07/15/23 0358  WBC 13.2* 11.8* 9.4 10.7*  HGB 8.8* 9.0* 8.9* 8.7*  HCT 25.9* 27.0* 26.6* 25.7*  PLT 134* 143* 133* 124*    COAGS: Recent Labs    07/13/23 0658  INR 1.1  APTT 44*    BMP: Recent Labs    07/12/23 0927 07/13/23 0659 07/14/23 0346 07/15/23 0358  NA 132*  132* 132* 131*  K 5.2* 5.7* 6.1* 4.6  CL 93* 90* 89* 88*  CO2 24 26 27 29   GLUCOSE 91 99 96 109*  BUN 45* 57* 70* 42*  CALCIUM  9.0 9.0 9.0 8.8*  CREATININE 8.13* 9.30* 10.22* 6.94*  GFRNONAA 6* 5* 5* 7*    LIVER FUNCTION TESTS: Recent Labs    04/08/23 1503 07/07/23 1407 07/08/23 0527 07/09/23 0314 07/13/23 0658 07/13/23 0659 07/14/23 0346 07/15/23 0358  BILITOT 0.5 0.7 0.7  --  0.7  --   --   --   AST 23 22 17   --  13*  --   --   --   ALT 16 22 18   --  13  --   --   --   ALKPHOS 80 85 71  --  73  --   --   --   PROT 7.3 7.1 6.0*  --  6.4*  --   --   --   ALBUMIN  4.2 3.0* 2.6*   < > 2.9* 2.9* 2.7* 2.7*   < > = values in this interval not displayed.    Assessment and Plan:  On exam today, the bulb was charged incorrectly (see photo).  Please do NOT charge the bulb from the bottom as it will not function properly this way.  Please only charge the bulb from the sides (see photo below).   Continue TID flushes with 5 cc NS.  Record output Q shift.  Dressing changes QD or PRN if soiled.   Call IR APP or on call IR MD if difficulty flushing or sudden change in drain output.  Repeat imaging/possible drain injection once output < 10 mL/QD (excluding flush material). Consideration for drain removal if output is < 10 mL/QD (excluding flush material), pending  discussion with the providing surgical service.  Discharge planning:  Please contact IR APP or on call IR MD prior to patient d/c to ensure appropriate follow up plans are in place.   Typically patient will follow up with IR clinic 10-14 days post d/c for repeat imaging/possible drain injection.   IR scheduler will contact patient with date/time of appointment. Patient will need to flush drain QD with 5 cc NS, record output QD, dressing changes every 2-3 days or earlier if soiled.   IR will continue to follow - please call with questions or concerns.  Electronically Signed: SARI GORMAN LAMP, PA-C 07/15/2023, 1:50 PM    I spent a total of 15 Minutes at the the patient's bedside AND on the patient's hospital floor or unit, greater than 50% of which was counseling/coordinating care for drain follow up.

## 2023-07-15 NOTE — Telephone Encounter (Signed)
 pharmacy comment: Alternative Requested:NEEDS P/A WITH MEDICARE D   The medication was stated as discontinued. Please advise.

## 2023-07-16 DIAGNOSIS — L02214 Cutaneous abscess of groin: Secondary | ICD-10-CM | POA: Diagnosis not present

## 2023-07-16 LAB — APTT
aPTT: 31 s (ref 24–36)
aPTT: 32 s (ref 24–36)

## 2023-07-16 LAB — CBC
HCT: 27.7 % — ABNORMAL LOW (ref 39.0–52.0)
Hemoglobin: 9.2 g/dL — ABNORMAL LOW (ref 13.0–17.0)
MCH: 40.9 pg — ABNORMAL HIGH (ref 26.0–34.0)
MCHC: 33.2 g/dL (ref 30.0–36.0)
MCV: 123.1 fL — ABNORMAL HIGH (ref 80.0–100.0)
Platelets: 100 10*3/uL — ABNORMAL LOW (ref 150–400)
RBC: 2.25 MIL/uL — ABNORMAL LOW (ref 4.22–5.81)
RDW: 14.7 % (ref 11.5–15.5)
WBC: 12.6 10*3/uL — ABNORMAL HIGH (ref 4.0–10.5)
nRBC: 0.2 % (ref 0.0–0.2)

## 2023-07-16 LAB — RENAL FUNCTION PANEL
Albumin: 2.9 g/dL — ABNORMAL LOW (ref 3.5–5.0)
Anion gap: 16 — ABNORMAL HIGH (ref 5–15)
BUN: 67 mg/dL — ABNORMAL HIGH (ref 8–23)
CO2: 29 mmol/L (ref 22–32)
Calcium: 9.8 mg/dL (ref 8.9–10.3)
Chloride: 89 mmol/L — ABNORMAL LOW (ref 98–111)
Creatinine, Ser: 9.47 mg/dL — ABNORMAL HIGH (ref 0.61–1.24)
GFR, Estimated: 5 mL/min — ABNORMAL LOW (ref >60)
Glucose, Bld: 109 mg/dL — ABNORMAL HIGH (ref 70–99)
Phosphorus: 7.8 mg/dL — ABNORMAL HIGH (ref 2.5–4.6)
Potassium: 5.6 mmol/L — ABNORMAL HIGH (ref 3.5–5.1)
Sodium: 134 mmol/L — ABNORMAL LOW (ref 135–145)

## 2023-07-16 LAB — HEPARIN LEVEL (UNFRACTIONATED): Heparin Unfractionated: >1.1 [IU]/mL — ABNORMAL HIGH (ref 0.30–0.70)

## 2023-07-16 MED ORDER — CHLORHEXIDINE GLUCONATE CLOTH 2 % EX PADS
6.0000 | MEDICATED_PAD | Freq: Every day | CUTANEOUS | Status: DC
  Administered 2023-07-17 – 2023-07-19 (×3): 6 via TOPICAL

## 2023-07-16 MED ORDER — LACTATED RINGERS IV BOLUS
500.0000 mL | Freq: Once | INTRAVENOUS | Status: AC
  Administered 2023-07-16: 500 mL via INTRAVENOUS

## 2023-07-16 MED ORDER — LACTATED RINGERS IV BOLUS: 500.0000 mL | Freq: Once | INTRAVENOUS | Status: AC

## 2023-07-16 MED ORDER — SODIUM CHLORIDE 0.9 % IV BOLUS: 250.0000 mL | Freq: Once | INTRAVENOUS | Status: DC

## 2023-07-16 MED ORDER — SODIUM ZIRCONIUM CYCLOSILICATE 10 G PO PACK
10.0000 g | PACK | Freq: Once | ORAL | Status: AC
  Administered 2023-07-16: 10 g via ORAL
  Filled 2023-07-16: qty 1

## 2023-07-16 NOTE — Progress Notes (Signed)
 Patient refused to come to dialysis today. His nurse Caprice Beaver reports that he wants to stay on his schedule. He plans to do treatment for dialysis tomorrow. Informing Marisue Ivan PA.

## 2023-07-16 NOTE — Progress Notes (Signed)
 IR physician on call paged for assistance with patient's loose nephrostomy tube by request of Dr Flora Lipps.

## 2023-07-16 NOTE — Progress Notes (Signed)
 When attempting to flush nephrostomy tube, this nurse noticed fluid dripping.  Upon assessment, it was realized that sutures surrounding the nephrostomy tubing have come loose.  Physician was notified with comment that "IR will be notified."

## 2023-07-16 NOTE — Plan of Care (Signed)

## 2023-07-16 NOTE — Progress Notes (Addendum)
 PHARMACY - ANTICOAGULATION CONSULT NOTE  Pharmacy Consult for heparin  Indication: atrial fibrillation  Allergies  Allergen Reactions   Cephalosporins Rash   Ciprofloxacin Itching and Rash   Baclofen Other (See Comments)    Altered mental status, after accidental overdose      Patient Measurements: Height: 5' 5 (165.1 cm) Weight: 69.6 kg (153 lb 8 oz) IBW/kg (Calculated) : 61.5 HEPARIN  DW (KG): 67.1   Vital Signs: Temp: 97.5 F (36.4 C) (01/09 0428) Temp Source: Oral (01/09 0428) BP: 79/51 (01/09 0428) Pulse Rate: 66 (01/09 0428)  Labs: Recent Labs    07/14/23 0127 07/14/23 0346 07/14/23 0346 07/14/23 1429 07/15/23 0358 07/16/23 0630  HGB  --  8.9*   < >  --  8.7* 9.2*  HCT  --  26.6*  --   --  25.7* 27.7*  PLT  --  133*  --   --  124* 100*  APTT  --   --   --   --   --  31  HEPARINUNFRC 0.37  --   --  0.31  --  >1.10*  CREATININE  --  10.22*  --   --  6.94*  --    < > = values in this interval not displayed.    Estimated Creatinine Clearance: 6.8 mL/min (A) (by C-G formula based on SCr of 6.94 mg/dL (H)).   Medical History: 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     Ileostomy in place 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 He is  noted with SBO vs ileus and pharmacy consulted to dose heparin .  Heparin  level >1.1 is affected by apixaban . APTT 31 is subtherapeutic due to patient refusing heparin  to be started last night. I spoke with patient this morning and he continues to refuse heparin  because he  thinks he is going for a procedure today. Unclear what procedure this is. Informed team, confirmed no procedure planned and would like to continue heparin . Team spoke with patient about plan to start heparin . Noted platelets trending down, no active bleeding.   Goal of Therapy:  Heparin  level 0.3-0.7 units/ml aPTT 66-102 seconds Monitor platelets by anticoagulation protocol: Yes   Plan:   Start heparin  950 units/hr F/u aPTT until correlates with heparin  level  F/u 8hr aPTT Monitor daily aPTT, heparin  level, CBC, signs/symptoms of bleeding  F/u resolution of ileus and restart apixaban  as able   Jinnie Door, PharmD, BCPS, BCCP Clinical Pharmacist  Please check AMION for all [facility] Pharmacy phone numbers After 10:00 PM, call Main Pharmacy [phone]

## 2023-07-16 NOTE — Plan of Care (Signed)
  Problem: Clinical Measurements: Goal: Will remain free from infection Outcome: Progressing Goal: Respiratory complications will improve Outcome: Progressing Goal: Cardiovascular complication will be avoided Outcome: Progressing   

## 2023-07-16 NOTE — Progress Notes (Signed)
**Note Joshua-Identified via Obfuscation**  Joshua Riley Progress Note   Subjective:  Seen in room - laying comfortably in bed with minimal sacral pain today. Denies CP/dyspnea. Patient refused HD yesterday due to being pushed later in the day. States he will restart tomorrow to get back on MWF schedule. Patient made aware of slightly elevated potassium and stated he would rather wait for HD tomorrow than go today. He is on a liquid diet for now due to concerns of a possible GI obstruction, but he states he has had multiple bowel movements in the last day. Got LR bolus this morning due to low BP.   Objective Vitals:   07/16/23 0006 07/16/23 0428 07/16/23 0713 07/16/23 0812  BP: (!) 88/52 (!) 79/51  (!) 99/56  Pulse: 71 66 63 74  Resp: 15 13 15 16   Temp: (!) 97.5 F (36.4 C) (!) 97.5 F (36.4 C)  (!) 97.4 F (36.3 C)  TempSrc: Oral Oral  Oral  SpO2: 93% 96% 94% 98%  Weight:   65.6 kg   Height:       Physical Exam General: Well appearing man, NAD. Room air. Heart: RRR; no murmur Lungs: CTA anteriorly Abdomen: soft with slight discomfort on palpation in epigastric area. Back: JP drain with small amount of serosanguinous fluid in bulb, nephrostomy bag with cloudy yellow urine from back. Extremities: no LE edema Dialysis Access:  L thigh AVG + t/b   Additional Objective Labs: Basic Metabolic Panel: Recent Labs  Lab 07/14/23 0346 07/15/23 0358 07/16/23 0630  NA 132* 131* 134*  K 6.1* 4.6 5.6*  CL 89* 88* 89*  CO2 27 29 29   GLUCOSE 96 109* 109*  BUN 70* 42* 67*  CREATININE 10.22* 6.94* 9.47*  CALCIUM  9.0 8.8* 9.8  PHOS 8.3* 6.3* 7.8*   Liver Function Tests: Recent Labs  Lab 07/13/23 0658 07/13/23 0659 07/14/23 0346 07/15/23 0358 07/16/23 0630  AST 13*  --   --   --   --   ALT 13  --   --   --   --   ALKPHOS 73  --   --   --   --   BILITOT 0.7  --   --   --   --   PROT 6.4*  --   --   --   --   ALBUMIN  2.9*   < > 2.7* 2.7* 2.9*   < > = values in this interval not displayed.     CBC: Recent Labs  Lab 07/11/23 0310 07/13/23 0658 07/14/23 0346 07/15/23 0358 07/16/23 0630  WBC 13.2* 11.8* 9.4 10.7* 12.6*  HGB 8.8* 9.0* 8.9* 8.7* 9.2*  HCT 25.9* 27.0* 26.6* 25.7* 27.7*  MCV 123.3* 123.9* 122.6* 123.0* 123.1*  PLT 134* 143* 133* 124* 100*   Blood Culture    Component Value Date/Time   SDES ABSCESS 07/13/2023 1309   SPECREQUEST Normal 07/13/2023 1309   CULT  07/13/2023 1309    ABUNDANT ESCHERICHIA COLI Confirmed Extended Spectrum Beta-Lactamase Producer (ESBL).  In bloodstream infections from ESBL organisms, carbapenems are preferred over piperacillin /tazobactam. They are shown to have a lower risk of mortality. NO ANAEROBES ISOLATED Performed at [facility], [address] [place], [place] [address]    REPTSTATUS 07/16/2023 FINAL 07/13/2023 1309    CBG: Recent Labs  Lab 07/09/23 1229 07/13/23 2104 07/14/23 0602  GLUCAP 96 114* 81   Studies/Results: DG Abd 1 View Result Date: 07/15/2023 CLINICAL DATA:  History of abscess in the left groin abdomen pain  EXAM: ABDOMEN - 1 VIEW COMPARISON:  CT 07/07/2023 FINDINGS: Small amount of radiopaque material in the region of the stomach. Ostomy in the right mid quadrant. History of total colectomy. Interval air distension of small bowel measuring up to 4.8 cm. Penile implant with reservoir in the right pelvis. Left abdominal nephrostomy tube. Interim placement pelvic drainage catheter with pigtail at the inferior pelvis. Question kinked appearance of the catheter along the left side wall. IMPRESSION: 1. Interval air distension of small bowel measuring up to 4.8 cm, possible ileus or obstruction. 2. Interim placement of pelvic drainage catheter with pigtail at the inferior pelvis. Question kinked appearance of the catheter along the left side wall. Electronically Signed   By: Luke Bun M.D.   On: 07/15/2023 16:43   Medications:  heparin      meropenem  (MERREM ) IV 1 g (07/14/23 1820)   sodium chloride        (feeding supplement) PROSource Plus  30 mL Oral BID BM   acyclovir  ointment   Topical Q3H   amiodarone   200 mg Oral BID   Chlorhexidine  Gluconate Cloth  6 each Topical Q0600   darbepoetin (ARANESP ) injection - DIALYSIS  100 mcg Subcutaneous Q Tue-1800   dorzolamide   1 drop Both Eyes BID   Gerhardt's butt cream   Topical BID   latanoprost   1 drop Right Eye QHS   magnesium  oxide  400 mg Oral BID   midodrine   10 mg Oral TID WC   mometasone -formoterol   2 puff Inhalation BID   sertraline   25 mg Oral QHS   sevelamer  carbonate  2,400 mg Oral TID WC   sodium chloride  flush  5 mL Intracatheter Q8H   sodium zirconium cyclosilicate   10 g Oral Daily   timolol   1 drop Both Eyes Daily    Dialysis Orders MWF - NW 2:45hr, 350/A1.5, EDW 68.5kg, 2K/2.5Ca, AVG, heparin  5000 unit bolus - Mircera 120mcg IV q 4 weeks (last 12/18) - Calcitriol  0.76mcg PO q HD   Assessment/Plan: Sepsis/groin abscess: S/p I&D 12/31, Blood Cx negative. Finished course of linezolid .  Sacral wound/fluid collection: Drain placed by IR 1/6, Cx grew E. Coli. On Meropenem . ESRD: Usual MWF schedule - for HD tomorrow after refusing yesterday to get back on usual MWF schedule. On Lokelma  10g daily for recurrrent hyperK at this time. Will give second dose Lokelma  10g today to hold over until HD tomorrow.  HypoTN/volume: BP low/stable. On mido 10mg  TID, UF as tolerated tomorrow. Got LR bolus this AM. Anemia of ESRD: Hgb 9.2, Aranesp  given 1/7 - continue q Tuesday during admit.  Secondary HPTH: CorrCa high, VDRA remains on hold. Phos high - continue Renvela  and monitor for now (has not getting binders consistently while inpatient). Nutrition: Alb low, continue supplements. A-fib: Hx RVR, better. On amiodarone  + Eliquis .  Signe Sick, PA-S Izetta Boehringer, PA-C 07/16/2023, 10:17 AM  [facility]

## 2023-07-16 NOTE — Progress Notes (Signed)
 Chief Complaint/Subjective: Patient reports abdominal pain is less severe but still having some occasional left sided pain. Ostomy output picked up significantly since last night and no nausea or vomiting. I emptied 175 cc while in the room. Drains functioning - the one to the bag has not put out much the one most recently placed in pre-sacral collection is putting out a mix of clear and bloody fluid.   Objective: Vital signs in last 24 hours: Temp:  [97.4 F (36.3 C)-97.8 F (36.6 C)] 97.4 F (36.3 C) (01/09 0812) Pulse Rate:  [63-85] 74 (01/09 0812) Resp:  [13-20] 16 (01/09 0812) BP: (79-99)/(46-60) 99/56 (01/09 0812) SpO2:  [93 %-98 %] 98 % (01/09 0812) Weight:  [65.6 kg] 65.6 kg (01/09 0713) Last BM Date : 07/16/23 Intake/Output from previous day: 01/08 0701 - 01/09 0700 In: 10 [I.V.:5] Out: 1850 [Emesis/NG output:200; Stool:1650]  PE: Gen: NAD Abd: soft, mild ttp of left side without peritonitis, ostomy viable and liquid effluent and gas present (emptied 175 cc), ND. Drain to gravity with scant cloudy fluid, drain to JP with moderate serous and blood tinged fluid. GU: left groin wound open and completely clean with no drainage.   Lab Results:  Recent Labs    07/15/23 0358 07/16/23 0630  WBC 10.7* 12.6*  HGB 8.7* 9.2*  HCT 25.7* 27.7*  PLT 124* 100*   Recent Labs    07/15/23 0358 07/16/23 0630  NA 131* 134*  K 4.6 5.6*  CL 88* 89*  CO2 29 29  GLUCOSE 109* 109*  BUN 42* 67*  CREATININE 6.94* 9.47*  CALCIUM  8.8* 9.8   No results for input(s): LABPROT, INR in the last 72 hours.     Component Value Date/Time   NA 134 (L) 07/16/2023 0630   NA 138 04/10/2023 1541   K 5.6 (H) 07/16/2023 0630   CL 89 (L) 07/16/2023 0630   CO2 29 07/16/2023 0630   GLUCOSE 109 (H) 07/16/2023 0630   BUN 67 (H) 07/16/2023 0630   BUN 19 04/10/2023 1541   CREATININE 9.47 (H) 07/16/2023 0630   CALCIUM  9.8 07/16/2023 0630   PROT 6.4 (L) 07/13/2023 0658   ALBUMIN  2.9 (L)  07/16/2023 0630   AST 13 (L) 07/13/2023 0658   ALT 13 07/13/2023 0658   ALKPHOS 73 07/13/2023 0658   BILITOT 0.7 07/13/2023 0658   GFRNONAA 5 (L) 07/16/2023 0630    Assessment/Plan ?SBO - reported 1-2 days of less ostomy output and emesis x1 - now not having any n/v and ileostomy output significantly increased  - still having some mild abdominal pain - ok to have CLD today, would hold off on resumption of DOAC for now, ok for hep gtt - reasonable to repeat CT at some point to evaluate multiple issues but not urgent since just imaged on 1/6   L groin abscess S/p I&D 12/31 Dr. Ann in clinic Stable and clean Bid wet to dry dressing changes - he states he can do this himself once stable to discharge.    Presacral fluid collection I have reviewed his chart extensively about the presacral fluid collection. It had a drain in it 4 months ago and was treated for infection, it was separated after drain dislodgement which was sterile at the time. On imaging now it is larger. It is not near intestine. It does not appear to track to the left groin. I have reached out to Dr. Leigh, at [facility] who is managing this, to try to  coordinate best plans for care of this complex condition. By Dr. Stevie.  Dr. Leigh has recommended IR drain - s/p drain placement 1/6, Cx with E.coli sensitive to imipenem  - patient will need to follow up with Dr. Leigh at [facility] for this problem  Pressure injury to sacrum -frequent turns to offload the patient -cont currently treatment otherwise   FEN: CLD ID: merrem  VTE: hold eliquis  today, fine for heparin  gtt if pt agreeable    LOS: 9 days   I reviewed Consultant IR notes, hospitalist notes, last 24 h vitals and pain scores, last 48 h intake and output, last 24 h labs and trends, and last 24 h imaging results.   Joshua Riley [facility] Surgery at [facility] 07/16/2023, 11:13 AM Please see Amion for pager number during day hours 7:00am-4:30pm or  7:00am -11:30am on weekends

## 2023-07-16 NOTE — Progress Notes (Addendum)
 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  He reported boil  started 3 weeks prior to admission, saw PCP who prescribed doxycycline  for 10 days.  Antibiotics did not help.  Patient admitted with groin abscess and  abscess in presacral space. Develops ileus vs SBO. Surgery re-consulted.    Assessment & Plan:   Principal Problem:   Abscess of groin, left Active Problems:   Severe sepsis (HCC)   End-stage renal disease on hemodialysis (HCC)   Depression   Leukocytosis   Paroxysmal atrial fibrillation (HCC)   History of COPD   Chronic diastolic CHF (congestive heart failure) (HCC)   History of anemia due to chronic kidney disease   Abdominal wall abscess   ESBL E. coli carrier   Anastomotic leak of intestine   1-Sepsis secondary to infected groin abscess, and  -Presacral space 5 x 4.5 cm fluid collection extending into left ischial anal fat slightly  increased from 06/11/2023 -Dr. Royal discussed with Dr. Stevie with general surgery on 07/10/2023--IR placed drain to be managed by Dr. Leigh COTA -ID following,= Culture growing ESBL E coli.  -Surgery will determine when to repeat CT scan.   ESRD Monday Wednesday and Friday via [facility] complicated by hypotension Continue with midodrine  Refuse HD today, plan for HD tomorrow.   SBO, Vs Ilues;  Report abdominal pain.  Avoid narcotics.  KUB ileus Vs  Obstruction.  Surgery re-consulted.  KUB with sign of SBO.  Now started to have high out put from ostomy.  IV bolus for increase out put.   A-fib on Eliquis  Had paroxysmal A-fib started on IV amiodarone  subsequently transition to oral, amiodarone  dose  reduced to 200 due to mild bradycardia Follow up outpatient with cardiology  Eliquis  change to heparin  gtt, due to SBO.  Refuse heparin  gtt.   Depression: Continue with Zoloft   Obstructive uropathy with chronic left PCN S/P exchange 2 weeks ago On IV antibiotics.  Tube leaking, and coming off , IR consulted and informed.   Ulcerative colitis status post colectomy 2011 with ileostomy, ileal pouch anal anastomosis surgery 2012, SBO History of rectal bleed secondary to gluteal cleft fistula- -discussed with Dr. Rollin GI--not a clear anoscopy/procedural operative candidate  -  Hypotension, chronic on Midodrine .  Worsening hypotension today form increase out put form ostomy.  IV bolus given   Previous DVT Not on anticoagulation.  CAD: LHC on 07/22/22 which showed no significant CAD and normal LVEDP.     See wound care documentation below.  Pressure Injury 07/08/23 Buttocks Bilateral Stage 1 -  Intact skin with non-blanchable redness of a localized area usually over a bony prominence. redness due to bleeding / pus wound (Active)  07/08/23 1454  Location: Buttocks  Location Orientation: Bilateral  Staging: Stage 1 -  Intact skin with non-blanchable redness of a localized area usually over a bony prominence.  Wound Description (Comments): redness due to bleeding / pus wound  Present on Admission: Yes  Dressing Type Foam - Lift dressing  to assess site every shift 07/16/23 0900                  Estimated body mass index is 24.07 kg/m as calculated from the following:   Height as of this encounter: 5' 5 (1.651 m).   Weight as of this encounter: 65.6 kg.   DVT prophylaxis: Eliquis  Code Status: Full code Family  Communication: daughter at bedside 1/08  Disposition Plan:  Status is: Inpatient Remains inpatient appropriate because: management of infection    Consultants:  Sx ID   Procedures:  none  Antimicrobials:    Subjective: He is alert, report abdominal pain resolved. Having increase out put now from ostomy, 500cc more this afternoon.    Objective: Vitals:   07/16/23 0428 07/16/23 0713 07/16/23 0812 07/16/23 1200  BP: (!) 79/51  (!) 99/56 (!) 91/50  Pulse: 66 63 74 61  Resp: 13 15 16 17   Temp: (!) 97.5 F (36.4 C)  (!) 97.4 F (36.3 C) (!) 97.4 F (36.3 C)  TempSrc: Oral  Oral Oral  SpO2: 96% 94% 98% 98%  Weight:  65.6 kg    Height:        Intake/Output Summary (Last 24 hours) at 07/16/2023 1517 Last data filed at 07/16/2023 1400 Gross per 24 hour  Intake 5 ml  Output 2290 ml  Net -2285 ml   Filed Weights   07/14/23 1113 07/15/23 0528 07/16/23 0713  Weight: 66.5 kg 69.6 kg 65.6 kg    Examination:  General exam: NAD Respiratory system: CTA Cardiovascular system: S 1, S 2 RRR Gastrointestinal system: BS present, soft, Ostomy bag with fluid Central nervous system: alert Extremities: Symmetric 5 x 5 power.   Data Reviewed: I have personally reviewed following labs and imaging studies  CBC: Recent Labs  Lab 07/11/23 0310 07/13/23 0658 07/14/23 0346 07/15/23 0358 07/16/23 0630  WBC 13.2* 11.8* 9.4 10.7* 12.6*  HGB 8.8* 9.0* 8.9* 8.7* 9.2*  HCT 25.9* 27.0* 26.6* 25.7* 27.7*  MCV 123.3* 123.9* 122.6* 123.0* 123.1*  PLT 134* 143* 133* 124* 100*   Basic Metabolic Panel: Recent Labs  Lab 07/11/23 1702 07/12/23 0326 07/12/23 0927 07/13/23 0659 07/14/23 0346 07/15/23 0358 07/16/23 0630  NA 132*  --  132* 132* 132* 131* 134*  K 5.5*  --  5.2* 5.7* 6.1* 4.6 5.6*  CL 90*  --  93* 90* 89* 88* 89*  CO2 25  --  24 26 27 29 29   GLUCOSE 90  --  91 99 96 109* 109*  BUN 34*  --  45* 57* 70* 42* 67*  CREATININE 6.72*  --  8.13* 9.30* 10.22* 6.94* 9.47*   CALCIUM  9.2  --  9.0 9.0 9.0 8.8* 9.8  MG  --  1.7  --   --  2.0  --   --   PHOS 6.1*  --   --  7.0* 8.3* 6.3* 7.8*   GFR: Estimated Creatinine Clearance: 5 mL/min (A) (by C-G formula based on SCr of 9.47 mg/dL (H)). Liver Function Tests: Recent Labs  Lab 07/13/23 0658 07/13/23 0659 07/14/23 0346 07/15/23 0358 07/16/23 0630  AST 13*  --   --   --   --   ALT 13  --   --   --   --   ALKPHOS 73  --   --   --   --   BILITOT 0.7  --   --   --   --   PROT 6.4*  --   --   --   --  ALBUMIN  2.9* 2.9* 2.7* 2.7* 2.9*   No results for input(s): LIPASE, AMYLASE in the last 168 hours. No results for input(s): AMMONIA in the last 168 hours. Coagulation Profile: Recent Labs  Lab 07/13/23 0658  INR 1.1   Cardiac Enzymes: No results for input(s): CKTOTAL, CKMB, CKMBINDEX, TROPONINI in the last 168 hours. BNP (last 3 results) No results for input(s): PROBNP in the last 8760 hours. HbA1C: No results for input(s): HGBA1C in the last 72 hours. CBG: Recent Labs  Lab 07/13/23 2104 07/14/23 0602  GLUCAP 114* 81   Lipid Profile: No results for input(s): CHOL, HDL, LDLCALC, TRIG, CHOLHDL, LDLDIRECT in the last 72 hours. Thyroid Function Tests: No results for input(s): TSH, T4TOTAL, FREET4, T3FREE, THYROIDAB in the last 72 hours. Anemia Panel: No results for input(s): VITAMINB12, FOLATE, FERRITIN, TIBC, IRON, RETICCTPCT in the last 72 hours. Sepsis Labs: No results for input(s): PROCALCITON, LATICACIDVEN in the last 168 hours.  Recent Results (from the past 240 hours)  Blood culture (routine x 2)     Status: None   Collection Time: 07/07/23  2:07 PM   Specimen: BLOOD  Result Value Ref Range Status   Specimen Description BLOOD RIGHT ANTECUBITAL  Final   Special Requests   Final    BOTTLES DRAWN AEROBIC AND ANAEROBIC Blood Culture adequate volume   Culture   Final    NO GROWTH 5 DAYS Performed at [facility], [address], [place], [place] [address]    Report Status 07/12/2023 FINAL  Final  Blood culture (routine x 2)     Status: None   Collection Time: 07/07/23  2:08 PM   Specimen: BLOOD LEFT FOREARM  Result Value Ref Range Status   Specimen Description BLOOD LEFT FOREARM  Final   Special Requests   Final    BOTTLES DRAWN AEROBIC ONLY Blood Culture results may not be optimal due to an inadequate volume of blood received in culture bottles   Culture   Final    NO GROWTH 5 DAYS Performed at [facility], [address] [place], [place] [address]    Report Status 07/12/2023 FINAL  Final  Resp panel by RT-PCR (RSV, Flu A&B, Covid) Anterior Nasal Swab     Status: None   Collection Time: 07/07/23  3:46 PM   Specimen: Anterior Nasal Swab  Result Value Ref Range Status   SARS Coronavirus 2 by RT PCR NEGATIVE NEGATIVE Final   Influenza A by PCR NEGATIVE NEGATIVE Final   Influenza B by PCR NEGATIVE NEGATIVE Final    Comment: (NOTE) The Xpert Xpress SARS-CoV-2/FLU/RSV plus assay is intended as an aid in the diagnosis of influenza from Nasopharyngeal swab specimens and should not be used as a sole basis for treatment. Nasal washings and aspirates are unacceptable for Xpert Xpress SARS-CoV-2/FLU/RSV testing.  Fact Sheet for Patients: bloggercourse.com  Fact Sheet for Healthcare Providers: seriousbroker.it  This test is not yet approved or cleared by the [place]  FDA and has been authorized for detection and/or diagnosis of SARS-CoV-2 by FDA under an Emergency Use Authorization (EUA). This EUA will remain in effect (meaning this test can be used) for the duration of the COVID-19 declaration under Section 564(b)(1) of the Act, 21 U.S.C. section 360bbb-3(b)(1), unless the authorization is terminated or revoked.     Resp Syncytial Virus by PCR NEGATIVE NEGATIVE Final    Comment: (NOTE) Fact Sheet for  Patients: bloggercourse.com  Fact Sheet for Healthcare Providers: seriousbroker.it  This test is  not yet approved or cleared by the [place]  FDA and has been authorized for detection and/or diagnosis of SARS-CoV-2 by FDA under an Emergency Use Authorization (EUA). This EUA will remain in effect (meaning this test can be used) for the duration of the COVID-19 declaration under Section 564(b)(1) of the Act, 21 U.S.C. section 360bbb-3(b)(1), unless the authorization is terminated or revoked.  Performed at [facility], [address] [place], [place] [address]   Aerobic/Anaerobic Culture w Gram Stain (surgical/deep wound)     Status: None (Preliminary result)   Collection Time: 07/13/23  1:09 PM   Specimen: Abscess  Result Value Ref Range Status   Specimen Description ABSCESS  Final   Special Requests Normal  Final   Gram Stain   Final    ABUNDANT WBC PRESENT, PREDOMINANTLY PMN RARE GRAM POSITIVE COCCI RARE GRAM NEGATIVE RODS    Culture   Final    ABUNDANT ESCHERICHIA COLI Confirmed Extended Spectrum Beta-Lactamase Producer (ESBL).  In bloodstream infections from ESBL organisms, carbapenems are preferred over piperacillin /tazobactam. They are shown to have a lower risk of mortality. NO ANAEROBES ISOLATED Performed at [facility], [address], [place], [place] [address]    Report Status PENDING  Incomplete   Organism ID, Bacteria ESCHERICHIA COLI  Final      Susceptibility   Escherichia coli - MIC*    AMPICILLIN >=32 RESISTANT Resistant     CEFEPIME  >=32 RESISTANT Resistant     CEFTAZIDIME RESISTANT Resistant     CEFTRIAXONE >=64 RESISTANT Resistant     CIPROFLOXACIN >=4 RESISTANT Resistant     GENTAMICIN <=1 SENSITIVE Sensitive     IMIPENEM <=0.25 SENSITIVE Sensitive     TRIMETH/SULFA >=320 RESISTANT Resistant     AMPICILLIN/SULBACTAM >=32 RESISTANT Resistant     PIP/TAZO >=128 RESISTANT Resistant ug/mL     * ABUNDANT ESCHERICHIA COLI         Radiology Studies: DG Abd 1 View Result Date: 07/15/2023 CLINICAL DATA:  History of abscess in the left groin abdomen pain EXAM: ABDOMEN - 1 VIEW COMPARISON:  CT 07/07/2023 FINDINGS: Small amount of radiopaque material in the region of the stomach. Ostomy in the right mid quadrant. History of total colectomy. Interval air distension of small bowel measuring up to 4.8 cm. Penile implant with reservoir in the right pelvis. Left abdominal nephrostomy tube. Interim placement pelvic drainage catheter with pigtail at the inferior pelvis. Question kinked appearance of the catheter along the left side wall. IMPRESSION: 1. Interval air distension of small bowel measuring up to 4.8 cm, possible ileus or obstruction. 2. Interim placement of pelvic drainage catheter with pigtail at the inferior pelvis. Question kinked appearance of the catheter along the left side wall. Electronically Signed   By: Luke Bun M.D.   On: 07/15/2023 16:43        Scheduled Meds:  (feeding supplement) PROSource Plus  30 mL Oral BID BM   acyclovir  ointment   Topical Q3H   amiodarone   200 mg Oral BID   Chlorhexidine  Gluconate Cloth  6 each Topical Q0600   [START ON 07/17/2023] Chlorhexidine  Gluconate Cloth  6 each Topical Q0600   darbepoetin (ARANESP ) injection - DIALYSIS  100 mcg Subcutaneous Q Tue-1800   dorzolamide   1 drop Both Eyes BID   Gerhardt's butt cream   Topical BID   latanoprost   1 drop Right Eye QHS   magnesium  oxide  400 mg Oral BID   midodrine   10 mg Oral TID WC  mometasone -formoterol   2 puff Inhalation BID   sertraline   25 mg Oral QHS   sevelamer  carbonate  2,400 mg Oral TID WC   sodium chloride  flush  5 mL Intracatheter Q8H   sodium zirconium cyclosilicate   10 g Oral Daily   timolol   1 drop Both Eyes Daily   Continuous Infusions:  heparin      meropenem  (MERREM ) IV 1 g (07/14/23 1820)   sodium chloride        LOS: 9 days    Time spent: 35  Minutes    Klynn Linnemann A Daniele Yankowski, MD [facility]   If 7PM-7AM, please contact night-coverage [email or web address]  07/16/2023, 3:17 PM

## 2023-07-17 ENCOUNTER — Inpatient Hospital Stay (HOSPITAL_COMMUNITY): Payer: Medicare Other

## 2023-07-17 DIAGNOSIS — Z992 Dependence on renal dialysis: Secondary | ICD-10-CM | POA: Diagnosis not present

## 2023-07-17 DIAGNOSIS — L02214 Cutaneous abscess of groin: Secondary | ICD-10-CM | POA: Diagnosis not present

## 2023-07-17 DIAGNOSIS — N186 End stage renal disease: Secondary | ICD-10-CM | POA: Diagnosis not present

## 2023-07-17 DIAGNOSIS — K6819 Other retroperitoneal abscess: Secondary | ICD-10-CM | POA: Diagnosis not present

## 2023-07-17 DIAGNOSIS — K651 Peritoneal abscess: Secondary | ICD-10-CM

## 2023-07-17 HISTORY — PX: IR NEPHROSTOMY EXCHANGE LEFT: IMG6069

## 2023-07-17 LAB — CBC
HCT: 25.3 % — ABNORMAL LOW (ref 39.0–52.0)
Hemoglobin: 8.6 g/dL — ABNORMAL LOW (ref 13.0–17.0)
MCH: 41.5 pg — ABNORMAL HIGH (ref 26.0–34.0)
MCHC: 34 g/dL (ref 30.0–36.0)
MCV: 122.2 fL — ABNORMAL HIGH (ref 80.0–100.0)
Platelets: 103 10*3/uL — ABNORMAL LOW (ref 150–400)
RBC: 2.07 MIL/uL — ABNORMAL LOW (ref 4.22–5.81)
RDW: 14.6 % (ref 11.5–15.5)
WBC: 7.6 10*3/uL (ref 4.0–10.5)
nRBC: 0 % (ref 0.0–0.2)

## 2023-07-17 LAB — RENAL FUNCTION PANEL
Albumin: 2.8 g/dL — ABNORMAL LOW (ref 3.5–5.0)
Anion gap: 15 (ref 5–15)
BUN: 77 mg/dL — ABNORMAL HIGH (ref 8–23)
CO2: 33 mmol/L — ABNORMAL HIGH (ref 22–32)
Calcium: 9.6 mg/dL (ref 8.9–10.3)
Chloride: 86 mmol/L — ABNORMAL LOW (ref 98–111)
Creatinine, Ser: 10.16 mg/dL — ABNORMAL HIGH (ref 0.61–1.24)
GFR, Estimated: 5 mL/min — ABNORMAL LOW (ref >60)
Glucose, Bld: 95 mg/dL (ref 70–99)
Phosphorus: 7.5 mg/dL — ABNORMAL HIGH (ref 2.5–4.6)
Potassium: 5.2 mmol/L — ABNORMAL HIGH (ref 3.5–5.1)
Sodium: 134 mmol/L — ABNORMAL LOW (ref 135–145)

## 2023-07-17 LAB — APTT: aPTT: 32 s (ref 24–36)

## 2023-07-17 LAB — HEPARIN LEVEL (UNFRACTIONATED): Heparin Unfractionated: 0.89 [IU]/mL — ABNORMAL HIGH (ref 0.30–0.70)

## 2023-07-17 MED ORDER — ALTEPLASE 2 MG IJ SOLR: 2.0000 mg | Freq: Once | INTRAMUSCULAR | Status: DC | PRN

## 2023-07-17 MED ORDER — PENTAFLUOROPROP-TETRAFLUOROETH EX AERO: 1.0000 | INHALATION_SPRAY | CUTANEOUS | Status: DC | PRN

## 2023-07-17 MED ORDER — LIDOCAINE-PRILOCAINE 2.5-2.5 % EX CREA: 1.0000 | TOPICAL_CREAM | CUTANEOUS | Status: DC | PRN

## 2023-07-17 MED ORDER — IOHEXOL 300 MG/ML  SOLN
50.0000 mL | Freq: Once | INTRAMUSCULAR | Status: AC | PRN
  Administered 2023-07-17: 10 mL

## 2023-07-17 MED ORDER — HEPARIN SODIUM (PORCINE) 1000 UNIT/ML DIALYSIS: 1000.0000 [IU] | INTRAMUSCULAR | Status: DC | PRN

## 2023-07-17 MED ORDER — ANTICOAGULANT SODIUM CITRATE 4% (200MG/5ML) IV SOLN: 5.0000 mL | Status: DC | PRN

## 2023-07-17 MED ORDER — HEPARIN SODIUM (PORCINE) 1000 UNIT/ML DIALYSIS: 5000.0000 [IU] | Freq: Once | INTRAMUSCULAR | Status: DC

## 2023-07-17 MED ORDER — LIDOCAINE HCL (PF) 1 % IJ SOLN
5.0000 mL | INTRAMUSCULAR | Status: DC | PRN
  Administered 2023-07-17: 5 mL via INTRADERMAL
  Filled 2023-07-17: qty 5

## 2023-07-17 MED ORDER — LIDOCAINE HCL 1 % IJ SOLN
INTRAMUSCULAR | Status: AC
  Filled 2023-07-17: qty 20

## 2023-07-17 NOTE — Consult Note (Addendum)
 CONSULT NOTE   MRN : [id number]  Reason for Consult: left groin, left thigh graft   History of Present Illness: Joshua Riley is a 88 y.o. male with medical history significant for end-stage renal disease on hemodialysis on Monday, sigmoid Friday, ulcerative colitis status post total proctocolectomy with diverting ileostomy, paroxysmal atrial fibrillation chronically anticoagulated on Eliquis , COPD, chronic diastolic heart failure, anemia of chronic disease associated baseline hemoglobin 8-11, who is admitted to [facility] on 07/07/2023 with severe sepsis due to left groin abscess after presenting from home to [facility] ED complaining of left groin abscess.   06/19/23 he presented to general surgery clinic today, at which time Dr. Ann performed I&D and packing of the left groin abscess, before recommending to the patient that he present to the emergency department for admission for IV antibiotics in the setting of failure of outpatient oral antibiotics for his left groin abscess.   We have been asked to evaluate his left thigh graft.  He had HD today without issues.       Current Facility-Administered Medications  Medication Dose Route Frequency Provider Last Rate Last Admin   (feeding supplement) PROSource Plus liquid 30 mL  30 mL Oral BID BM Stovall, Kathryn R, PA-C   30 mL at 07/16/23 0814   acetaminophen  (TYLENOL ) tablet 650 mg  650 mg Oral Q6H PRN Howerter, Justin B, DO   650 mg at 07/15/23 1233   Or   acetaminophen  (TYLENOL ) suppository 650 mg  650 mg Rectal Q6H PRN Howerter, Justin B, DO       acyclovir  ointment (ZOVIRAX ) 5 %   Topical Q3H Regalado, Belkys A, MD   Given at 07/17/23 0431   albuterol  (PROVENTIL ) (2.5 MG/3ML) 0.083% nebulizer solution 2.5 mg  2.5 mg Nebulization Q4H PRN Howerter, Justin B, DO       alum & mag hydroxide-simeth (MAALOX/MYLANTA) 200-200-20 MG/5ML suspension 30 mL  30 mL Oral Q6H PRN Samtani, Jai-Gurmukh, MD   30 mL at 07/15/23 9366   amiodarone   (PACERONE ) tablet 200 mg  200 mg Oral BID Samtani, Jai-Gurmukh, MD   200 mg at 07/16/23 2128   Chlorhexidine  Gluconate Cloth 2 % PADS 6 each  6 each Topical Q0600 Collins, Samantha G, PA-C   6 each at 07/16/23 0555   Chlorhexidine  Gluconate Cloth 2 % PADS 6 each  6 each Topical Q0600 Stovall, Kathryn R, PA-C   6 each at 07/17/23 0431   Darbepoetin Alfa  (ARANESP ) injection 100 mcg  100 mcg Subcutaneous Q Tue-1800 Stovall, Kathryn R, PA-C   100 mcg at 07/14/23 1844   dorzolamide  (TRUSOPT ) 2 % ophthalmic solution 1 drop  1 drop Both Eyes BID Samtani, Jai-Gurmukh, MD   1 drop at 07/16/23 2135   Gerhardt's butt cream   Topical BID Samtani, Jai-Gurmukh, MD   1 Application at 07/16/23 2136   heparin  ADULT infusion 100 units/mL (25000 units/250mL)  950 Units/hr Intravenous Continuous Chen, Lydia D, [facility]       latanoprost  (XALATAN ) 0.005 % ophthalmic solution 1 drop  1 drop Right Eye QHS Samtani, Jai-Gurmukh, MD   1 drop at 07/16/23 2127   magnesium  oxide (MAG-OX) tablet 400 mg  400 mg Oral BID Samtani, Jai-Gurmukh, MD   400 mg at 07/16/23 2128   melatonin tablet 3 mg  3 mg Oral QHS PRN Howerter, Justin B, DO   3 mg at 07/14/23 2234   meropenem  (MERREM ) 1 g in sodium chloride  0.9 % 100 mL IVPB  1 g  Intravenous Q24H Hershal Vito MATSU, RPH 200 mL/hr at 07/16/23 1747 1 g at 07/16/23 1747   metoprolol  tartrate (LOPRESSOR ) injection 2.5 mg  2.5 mg Intravenous Q6H PRN Samtani, Jai-Gurmukh, MD       midodrine  (PROAMATINE ) tablet 10 mg  10 mg Oral TID WC Howerter, Justin B, DO   10 mg at 07/17/23 1126   mometasone -formoterol  (DULERA ) 200-5 MCG/ACT inhaler 2 puff  2 puff Inhalation BID Howerter, Justin B, DO   2 puff at 07/16/23 9180   ondansetron  (ZOFRAN ) injection 4 mg  4 mg Intravenous Q6H PRN Howerter, Justin B, DO   4 mg at 07/13/23 8177   Oral care mouth rinse  15 mL Mouth Rinse PRN Samtani, Jai-Gurmukh, MD       oxyCODONE -acetaminophen  (PERCOCET) 7.5-325 MG per tablet 1 tablet  1 tablet Oral Q4H PRN Samtani,  Jai-Gurmukh, MD   1 tablet at 07/16/23 9185   sertraline  (ZOLOFT ) tablet 25 mg  25 mg Oral QHS Samtani, Jai-Gurmukh, MD   25 mg at 07/16/23 2128   sevelamer  carbonate (RENVELA ) tablet 2,400 mg  2,400 mg Oral TID WC Howerter, Justin B, DO   2,400 mg at 07/16/23 1750   sodium chloride  0.9 % bolus 250 mL  250 mL Intravenous Once Regalado, Belkys A, MD       sodium chloride  flush (NS) 0.9 % injection 5 mL  5 mL Intracatheter Q8H Karalee Wilkie POUR, MD   5 mL at 07/17/23 0430   sodium zirconium cyclosilicate  (LOKELMA ) packet 10 g  10 g Oral Daily Samtani, Jai-Gurmukh, MD   10 g at 07/16/23 9185   timolol  (TIMOPTIC ) 0.5 % ophthalmic solution 1 drop  1 drop Both Eyes Daily Samtani, Jai-Gurmukh, MD   1 drop at 07/15/23 1022   traMADol  (ULTRAM ) tablet 50 mg  50 mg Oral Q8H PRN Samtani, Jai-Gurmukh, MD   50 mg at 07/10/23 2152    Pt meds include: Statin :No Betablocker: No ASA: No Other anticoagulants/antiplatelets: Eliquis  for Afib  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     Ileostomy in place [facility])    Obstructive uropathy    With chronic left nephrostomy   Old retinal detachment, total or subtotal    Orthostatic hypotension 10/22/2021   Sleep apnea    does not need a machine   Stroke (HCC)    Ulcerative colitis (HCC)    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    Past Surgical History:  Procedure Laterality Date   BASAL CELL CARCINOMA EXCISION     10/23   COLON SURGERY     creation of j pouch     and subsequent takedown of j pouch   EYE SURGERY     IR NEPHROSTOMY EXCHANGE LEFT  12/10/2021   IR NEPHROSTOMY EXCHANGE LEFT   04/22/2022   IR NEPHROSTOMY EXCHANGE LEFT  07/29/2022   IR NEPHROSTOMY EXCHANGE LEFT  10/28/2022   IR NEPHROSTOMY EXCHANGE LEFT  02/05/2023   IR NEPHROSTOMY EXCHANGE LEFT  05/07/2023   IR  NEPHROSTOMY EXCHANGE LEFT  06/25/2023   IR NEPHROSTOMY EXCHANGE LEFT  07/17/2023   LEFT HEART CATH AND CORONARY ANGIOGRAPHY N/A 07/22/2022   Procedure: LEFT HEART CATH AND CORONARY ANGIOGRAPHY;  Surgeon: Jordan, Peter M, MD;  Location: [facility] INVASIVE CV LAB;  Service: Cardiovascular;  Laterality: N/A;   REVISION OF ARTERIOVENOUS GORETEX GRAFT Left 05/06/2022   Procedure: REDO LEFT THIGH ARTERIOVENOUS 4-7 MM GORETEX GRAFT;  Surgeon: Eliza Lonni RAMAN, MD;  Location: [facility] OR;  Service: Vascular;  Laterality: Left;   SMALL INTESTINE SURGERY     TOTAL COLECTOMY      Social History Social History   Tobacco Use   Smoking status: Former    Current packs/day: 0.00    Average packs/day: 2.0 packs/day for 6.0 years (12.0 ttl pk-yrs)    Types: Cigarettes    Start date: 66    Quit date: 1985    Years since quitting: 40.0    Passive exposure: Never   Smokeless tobacco: Never  Vaping Use   Vaping status: Never Used  Substance Use Topics   Alcohol use: Yes    Alcohol/week: 5.0 standard drinks of alcohol    Types: 5 Shots of liquor per week    Comment: socially   Drug use: Never    Family History Family History  Problem Relation Age of Onset   Stroke Mother    Cancer Father    Esophageal cancer Brother     Allergies  Allergen Reactions   Cephalosporins Rash   Ciprofloxacin Itching and Rash   Baclofen Other (See Comments)    Altered mental status, after accidental overdose       REVIEW OF SYSTEMS  General: [ ]  Weight loss, [ ]  Fever, [ ]  chills Neurologic: [ ]  Dizziness, [ ]  Blackouts, [ ]  Seizure [ ]  Stroke, [ ]  Mini stroke, [ ]  Slurred speech, [ ]  Temporary blindness; [ ]  weakness in arms or legs, [ ]  Hoarseness [ ]  Dysphagia Cardiac: [ ]  Chest pain/pressure, [ ]  Shortness of breath at  rest [ ]  Shortness of breath with exertion, [ ]  Atrial fibrillation or irregular heartbeat  Vascular: [ ]  Pain in legs with walking, [ ]  Pain in legs at rest, [ ]  Pain in legs at night,  [ ]  Non-healing ulcer, [ ]  Blood clot in vein/DVT,   Pulmonary: [ ]  Home oxygen, [ ]  Productive cough, [ ]  Coughing up blood, [ ]  Asthma,  [ ]  Wheezing [ ]  COPD Musculoskeletal:  [ ]  Arthritis, [ ]  Low back pain, [ ]  Joint pain Hematologic: [ ]  Easy Bruising, [ ]  Anemia; [ ]  Hepatitis Gastrointestinal: [ ]  Blood in stool, [ ]  Gastroesophageal Reflux/heartburn, Urinary: [ ]  chronic Kidney disease, [x ] on HD - [ ]  MWF or [ ]  TTHS, [ ]  Burning with urination, [ ]  Difficulty urinating Skin: [ ]  Rashes, [ x] Wounds Psychological: [ ]  Anxiety, [ ]  Depression  Physical Examination Vitals:   07/17/23 1200 07/17/23 1230 07/17/23 1240 07/17/23 1334  BP: (!) 98/57 (!) 120/97 (!) 103/53 (!) 105/51  Pulse: 83 75 64   Resp: (!) 30 16 16 15   Temp:   97.6 F (36.4 C)   TempSrc:      SpO2: 100% 100% 100% 100%  Weight:   66 kg   Height:       Body mass index is 24.21 kg/m.  General:  WDWN in NAD  HENT: WNL Eyes: Pupils equal Pulmonary: normal non-labored breathing , without Rales, rhonchi,  wheezing  Cardiac: RRR, without  Murmurs, rubs or gallops; No carotid bruits Abdomen: soft, NT, no masses Skin: left scrotal crease yeast infection red raw skin rashes, ulcers noted;  no Gangrene , no cellulitis; incision open for drainage left scrotum    Vascular Exam/Pulses:Brisk biphasic DP/PT B LE Left thigh graft without edema, NTTP, and no erythema  Musculoskeletal: no muscle wasting or atrophy; no edema  Neurologic: A&O X 3; Appropriate Affect ;  SENSATION: normal; MOTOR FUNCTION: 5/5 Symmetric B UE/LE Speech is fluent/normal   Significant Diagnostic Studies: CBC Lab Results  Component Value Date   WBC 7.6 07/17/2023   HGB 8.6 (L) 07/17/2023   HCT 25.3 (L) 07/17/2023   MCV 122.2 (H) 07/17/2023   PLT  103 (L) 07/17/2023    BMET    Component Value Date/Time   NA 134 (L) 07/17/2023 0324   NA 138 04/10/2023 1541   K 5.2 (H) 07/17/2023 0324   CL 86 (L) 07/17/2023 0324   CO2 33 (H) 07/17/2023 0324   GLUCOSE 95 07/17/2023 0324   BUN 77 (H) 07/17/2023 0324   BUN 19 04/10/2023 1541   CREATININE 10.16 (H) 07/17/2023 0324   CALCIUM  9.6 07/17/2023 0324   GFRNONAA 5 (L) 07/17/2023 0324   Estimated Creatinine Clearance: 4.6 mL/min (A) (by C-G formula based on SCr of 10.16 mg/dL (H)).  COAG Lab Results  Component Value Date   INR 1.1 07/13/2023   INR 1.3 (H) 06/02/2022    CTA: Other: No pneumoperitoneum. No ascites. Partially visualized surgical grafts arising from the left common femoral artery and vein and extending inferiorly in the subcutaneous ventral left thigh with stable small 1.9 x 1.6 cm fluid collection at the anterior origin of the surgical grafts (series 3/image 73).  ASSESSMENT/PLAN:  He states he has no issues with the thigh graft and it works well.  ESRD on HD with left thigh graft  CTA showed fluid anterior graft.  I did not appreciate fluid or fluctuance on exam.    The thigh graft has no appearance of infection.  He had HD today with out pain or issues.  No intervention is needed.    I will check the thigh graft again tomorrow.  Vascular surgery will be available as needed.     Maurilio Deland Collet 07/17/2023 2:26 PM   I have interviewed and examined patient with PA and agree with assessment and plan above.  By CT there is evidence of fluid collection that is stable since early December and there was actually evidence of a small fluid collection going back over 1 year ago.  Nothing appears infected on exam associated with the graft and the graft is functioning well.  As such no surgical intervention necessary at this time and he can continue to use graft as needed.  Breeann Reposa C. Sheree, MD [facility] Office:  [phone] Pager: [phone]

## 2023-07-17 NOTE — Progress Notes (Signed)
 Spoke with Pts daughter regarding post discharge care of drain.  She requests to speak with Dr.  Sarajane Marek of general surgery.  Request made via paging system on amion including daughters phone number.

## 2023-07-17 NOTE — Progress Notes (Signed)
 Referring Physician(s): Kinsinger, Herlene, MD  Supervising Physician: Hughes Simmonds  Patient Status:  [facility] - In-pt  Chief Complaint: Follow up left TG drain placed 07/13/23 in IR  Subjective:  Patient seen while down in IR for PCN exchange - he denies any complaints regarding the drain, hoping it can come out soon. He is agreeable to PCN exchange.   Allergies: Cephalosporins, Ciprofloxacin, and Baclofen  Medications: Prior to Admission medications   Medication Sig Start Date End Date Taking? Authorizing Provider  acetaminophen  (TYLENOL ) 500 MG tablet Take 1,000 mg by mouth as needed for mild pain (pain score 1-3).   Yes [provider]  albuterol  (PROVENTIL ) (2.5 MG/3ML) 0.083% nebulizer solution Take 2.5 mg by nebulization 2 (two) times daily. 06/17/23  Yes [provider]  BIOTIN PO Take 1 capsule by mouth daily.   Yes [provider]  budesonide  (PULMICORT ) 0.5 MG/2ML nebulizer solution TAKE 2 ML (0.5 MG TOTAL) BY NEBULIZATION TWICE A DAY 06/29/23  Yes Hope Almarie ORN, NP  Cyanocobalamin  (B-12) 1000 MCG SUBL Place 1 tablet under the tongue daily at 6 (six) AM. 04/08/23  Yes Jesus Bernardino MATSU, MD  dorzolamide  (TRUSOPT ) 2 % ophthalmic solution Place 1 drop into both eyes 2 (two) times daily. 06/17/23  Yes [provider]  ELIQUIS  2.5 MG TABS tablet Take 1 tablet (2.5 mg total) by mouth 2 (two) times daily. 08/07/22  Yes Hobart Powell BRAVO, MD  latanoprost  (XALATAN ) 0.005 % ophthalmic solution Place 1 drop into the right eye at bedtime.   Yes [provider]  midodrine  (PROAMATINE ) 10 MG tablet Take 1 tablet (10 mg total) by mouth 3 (three) times daily. Takes 5 mg plus 10mg  for 15 mg total three times a day- started by nephrology in [place]  08/07/22  Yes Pemberton, Powell BRAVO, MD  midodrine  (PROAMATINE ) 5 MG tablet TAKE 1 TABLET 3 TIMES DAILY WITH MEALS. TAKES 5 MG PLUS 10MG  FOR 15 MG TOTAL THREE TIMES A DAY- STARTED BY NEPHROLOGY IN [place]   10/20/22  Yes Hunter, Garnette KIDD, MD  Omega-3 Fatty Acids (OMEGA-3 FISH OIL) 500 MG CAPS Take 1 capsule by mouth daily.   Yes [provider]  riboflavin (VITAMIN B-2) 100 MG TABS tablet Take 100 mg by mouth daily.   Yes [provider]  sertraline  (ZOLOFT ) 25 MG tablet Take 1 tablet (25 mg total) by mouth daily. Patient taking differently: Take 25 mg by mouth at bedtime. 04/08/23  Yes Jesus Bernardino MATSU, MD  sevelamer  carbonate (RENVELA ) 800 MG tablet Take 2,400 mg by mouth 3 (three) times daily with meals.   Yes [provider]  timolol  (TIMOPTIC ) 0.5 % ophthalmic solution Place 1 drop into both eyes daily.   Yes [provider]  traMADol  (ULTRAM ) 50 MG tablet Take 1 tablet (50 mg total) by mouth every 8 (eight) hours as needed (prn pain). 06/26/23  Yes Hudnell, Corean, NP  WIXELA INHUB 250-50 MCG/ACT AEPB Inhale 1 puff into the lungs 2 (two) times daily. 07/04/23  Yes [provider]  Albuterol  Sulfate 2.5 MG/0.5ML NEBU Inhale 0.5 mLs (2.5 mg total) into the lungs every 6 (six) hours. 07/15/23   Hope Almarie ORN, NP  ramelteon  (ROZEREM ) 8 MG tablet TAKE 1 TABLET BY MOUTH AT BEDTIME. 07/09/23   Katrinka Garnette KIDD, MD     Vital Signs: BP (!) 105/51 (BP Location: Left Arm)   Pulse 64   Temp 97.6 F (36.4 C)   Resp 15   Ht 5' 5 (1.651  m)   Wt 145 lb 8.1 oz (66 kg)   SpO2 100%   BMI 24.21 kg/m   Physical Exam Vitals and nursing note reviewed.  Constitutional:      General: He is not in acute distress. HENT:     Head: Normocephalic.  Cardiovascular:     Rate and Rhythm: Normal rate.  Pulmonary:     Effort: Pulmonary effort is normal.  Abdominal:     Comments: (+) left TG drain to suction bulb - ~10 cc serous output in bulb with red fibrin remnants on the wall of the bulb. Insertion site clean, dry, dressed appropriately.   Neurological:     Mental Status: He is alert.     Imaging: DG Abd 1 View Result Date: 07/15/2023 CLINICAL DATA:   History of abscess in the left groin abdomen pain EXAM: ABDOMEN - 1 VIEW COMPARISON:  CT 07/07/2023 FINDINGS: Small amount of radiopaque material in the region of the stomach. Ostomy in the right mid quadrant. History of total colectomy. Interval air distension of small bowel measuring up to 4.8 cm. Penile implant with reservoir in the right pelvis. Left abdominal nephrostomy tube. Interim placement pelvic drainage catheter with pigtail at the inferior pelvis. Question kinked appearance of the catheter along the left side wall. IMPRESSION: 1. Interval air distension of small bowel measuring up to 4.8 cm, possible ileus or obstruction. 2. Interim placement of pelvic drainage catheter with pigtail at the inferior pelvis. Question kinked appearance of the catheter along the left side wall. Electronically Signed   By: Luke Bun M.D.   On: 07/15/2023 16:43   CT GUIDED VISCERAL FLUID DRAIN BY PERC CATH Result Date: 07/13/2023 INDICATION: 88 year old male with a history of chronic recurrent presacral abscess. He presents for repeat drain placement. EXAM: CT-guided drain placement MEDICATIONS: The patient is currently admitted to the hospital and receiving intravenous antibiotics. The antibiotics were administered within an appropriate time frame prior to the initiation of the procedure. ANESTHESIA/SEDATION: Moderate (conscious) sedation was employed during this procedure. A total of Versed  2 mg and Fentanyl  100 mcg was administered intravenously by the radiology nurse. Total intra-service moderate Sedation Time: 13 minutes. The patient's level of consciousness and vital signs were monitored continuously by radiology nursing throughout the procedure under my direct supervision. COMPLICATIONS: None immediate. PROCEDURE: Informed written consent was obtained from the patient after a thorough discussion of the procedural risks, benefits and alternatives. All questions were addressed. Maximal Sterile Barrier Technique was  utilized including caps, mask, sterile gowns, sterile gloves, sterile drape, hand hygiene and skin antiseptic. A timeout was performed prior to the initiation of the procedure. A planning axial CT scan was performed. The complex presacral fluid collection is identified. A suitable skin entry site was selected and marked. The overlying skin was sterilely prepped and draped in the standard fashion using chlorhexidine  skin prep. Local anesthesia was attained by infiltration with 1% lidocaine . A small dermatotomy was made. Under intermittent CT guidance, an 18 gauge trocar needle was advanced along a left parasacral transgluteal approach into the fluid collection. A 0.035 wire was then coiled in the fluid collection. The needle was removed. The percutaneous tract was dilated to 12 French. A Cook 12 French all-purpose drainage catheter was advanced over the wire and formed. Aspiration yields approximately 75 mL thick purulent fluid. Postoperative CT imaging demonstrates a well-positioned drainage catheter. Of note, there is a vacuum space created by aspiration of the fluid without collapse. This is consistent with a chronic cavity.  The drainage catheter was flushed and connected to JP bulb suction before being secured to the skin with 0 Prolene suture. IMPRESSION: 1. Successful placement of a 12 French drainage catheter via a left transgluteal approach with evacuation of 75 mL thick purulent fluid. A sample was sent for Gram stain and culture. 2. Postoperative CT imaging demonstrates a vacuum space within the evacuated thick walled cavity rather than collapse of the cavity. This suggesting a chronic space which will be prone to recurrent abscess formation. Electronically Signed   By: Wilkie Lent M.D.   On: 07/13/2023 13:35    Labs:  CBC: Recent Labs    07/14/23 0346 07/15/23 0358 07/16/23 0630 07/17/23 0324  WBC 9.4 10.7* 12.6* 7.6  HGB 8.9* 8.7* 9.2* 8.6*  HCT 26.6* 25.7* 27.7* 25.3*  PLT 133* 124*  100* 103*    COAGS: Recent Labs    07/13/23 0658 07/16/23 0630 07/16/23 1901 07/17/23 0324  INR 1.1  --   --   --   APTT 44* 31 32 32    BMP: Recent Labs    07/14/23 0346 07/15/23 0358 07/16/23 0630 07/17/23 0324  NA 132* 131* 134* 134*  K 6.1* 4.6 5.6* 5.2*  CL 89* 88* 89* 86*  CO2 27 29 29  33*  GLUCOSE 96 109* 109* 95  BUN 70* 42* 67* 77*  CALCIUM  9.0 8.8* 9.8 9.6  CREATININE 10.22* 6.94* 9.47* 10.16*  GFRNONAA 5* 7* 5* 5*    LIVER FUNCTION TESTS: Recent Labs    04/08/23 1503 07/07/23 1407 07/08/23 0527 07/09/23 0314 07/13/23 0658 07/13/23 0659 07/14/23 0346 07/15/23 0358 07/16/23 0630 07/17/23 0324  BILITOT 0.5 0.7 0.7  --  0.7  --   --   --   --   --   AST 23 22 17   --  13*  --   --   --   --   --   ALT 16 22 18   --  13  --   --   --   --   --   ALKPHOS 80 85 71  --  73  --   --   --   --   --   PROT 7.3 7.1 6.0*  --  6.4*  --   --   --   --   --   ALBUMIN  4.2 3.0* 2.6*   < > 2.9*   < > 2.7* 2.7* 2.9* 2.8*   < > = values in this interval not displayed.    Assessment and Plan:  88 y/o M with history of recurrent presacral fluid collection s/p left TG drain placement 07/13/23 in IR seen today for drain follow and PCN exchange. Drain functioning appropriately. Culture (+) e.coli, currently on meropenem  per primary team.   Drain Location: Left transgluteal Size: Fr size: 12 Fr Date of placement: 07/13/23  Currently to: Drain collection device: suction bulb 24 hour output:  Output by Drain (mL) 07/15/23 0701 - 07/15/23 1900 07/15/23 1901 - 07/16/23 0700 07/16/23 0701 - 07/16/23 1900 07/16/23 1901 - 07/17/23 0700 07/17/23 0701 - 07/17/23 1410  Closed System Drain Left Buttock Bulb (JP) 12 Fr. 20  20 20      Interval imaging/drain manipulation:  KUB 07/15/23  Current examination: Flushes/aspirates easily.  Insertion site unremarkable. Suture and stat lock in place. Dressed appropriately.   Plan: Continue TID flushes with 5 cc NS. Record output Q  shift. Dressing changes QD or PRN if soiled.  Call IR APP or on  call IR MD if difficulty flushing or sudden change in drain output.  Repeat imaging/possible drain injection once output < 10 mL/QD (excluding flush material). Consideration for drain removal if output is < 10 mL/QD (excluding flush material), pending discussion with the providing surgical service.  Discharge planning: Please contact IR APP or on call IR MD prior to patient d/c to ensure appropriate follow up plans are in place. Typically patient will follow up with IR clinic 10-14 days post d/c for repeat imaging/possible drain injection. IR scheduler will contact patient with date/time of appointment. Patient will need to flush drain QD with 5 cc NS, record output QD, dressing changes every 2-3 days or earlier if soiled.   IR will continue to follow - please call with questions or concerns.  Electronically Signed: Clotilda DELENA Hesselbach, PA-C 07/17/2023, 8:22 AM   I spent a total of 15 Minutes at the the patient's bedside AND on the patient's hospital floor or unit, greater than 50% of which was counseling/coordinating care for presacral abscess.

## 2023-07-17 NOTE — Progress Notes (Signed)
 PT Cancellation Note  Patient Details Name: Joshua Riley MRN: [id number] DOB: [birth year]   Cancelled Treatment:    Reason Eval/Treat Not Completed: Patient at procedure or test/unavailable (HD). Will follow up for PT treatment as schedule permits.  Jenny Omdahl, PT, DPT Acute Rehabilitation Services  Personal: Secure Chat Rehab Office: [phone]  Darice LITTIE Almas 07/17/2023, 10:08 AM

## 2023-07-17 NOTE — Progress Notes (Signed)
 PHARMACY - ANTICOAGULATION CONSULT NOTE  Pharmacy Consult for heparin  Indication: atrial fibrillation  Allergies  Allergen Reactions   Cephalosporins Rash   Ciprofloxacin Itching and Rash   Baclofen Other (See Comments)    Altered mental status, after accidental overdose      Patient Measurements: Height: 5' 5 (165.1 cm) Weight: 66.3 kg (146 lb 2.6 oz) IBW/kg (Calculated) : 61.5 HEPARIN  DW (KG): 67.1   Vital Signs: Temp: 97.5 F (36.4 C) (01/10 0344) Temp Source: Oral (01/10 0344) BP: 107/56 (01/10 0344) Pulse Rate: 57 (01/10 0344)  Labs: Recent Labs    07/14/23 1429 07/15/23 0358 07/15/23 0358 07/16/23 0630 07/16/23 1901 07/17/23 0324  HGB  --  8.7*   < > 9.2*  --  8.6*  HCT  --  25.7*  --  27.7*  --  25.3*  PLT  --  124*  --  100*  --  103*  APTT  --   --   --  31 32 32  HEPARINUNFRC 0.31  --   --  >1.10*  --  0.89*  CREATININE  --  6.94*  --  9.47*  --  10.16*   < > = values in this interval not displayed.    Estimated Creatinine Clearance: 4.6 mL/min (A) (by C-G formula based on SCr of 10.16 mg/dL (H)).   Medical History: 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     Ileostomy in place [facility])    Obstructive uropathy    With chronic left nephrostomy   Old retinal detachment, total or subtotal    Orthostatic hypotension 10/22/2021   Sleep apnea    does not need a machine   Stroke (HCC)    Ulcerative colitis (HCC)    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 He  is noted with SBO vs ileus and pharmacy consulted to dose heparin .  Patient continues to refuse heparin  drip despite multiple team members discussing it with him.   Goal of Therapy:  Heparin  level 0.3-0.7 units/ml aPTT 66-102 seconds Monitor platelets by anticoagulation protocol: Yes   Plan:   Hold heparin  per patient  F/u restart apixaban  when SBO resolved   Jinnie Door, PharmD, BCPS, [facility] Clinical Pharmacist  Please check AMION for all [facility] Pharmacy phone numbers After 10:00 PM, call Main Pharmacy [phone]

## 2023-07-17 NOTE — Progress Notes (Signed)
   07/17/23 1240  Vitals  Temp 97.6 F (36.4 C)  Pulse Rate 64  Resp 16  BP (!) 103/53  SpO2 100 %  O2 Device Room Air  Weight 66 kg  Type of Weight Post-Dialysis  Oxygen Therapy  Patient Activity (if Appropriate) In bed  Pulse Oximetry Type Continuous  Oximetry Probe Site Changed No  Post Treatment  Dialyzer Clearance Lightly streaked  Hemodialysis Intake (mL) 0 mL  Liters Processed 60  Fluid Removed (mL) 300 mL  Post-Hemodialysis Comments pt signed an AMA to come off macxhine after 2hours 30 minutes--Kat PA is aware  AVG/AVF Arterial Site Held (minutes) 20 minutes  AVG/AVF Venous Site Held (minutes) 10 minutes   Received patient in bed to unit.  Alert and oriented.  Informed consent signed and in chart.   TX duration:pt ran for 2h and 1m--signed an AMA forme to come off at that time--Nephrology aware  Patient tolerated well.  Transported back to the room  Alert, without acute distress.  Hand-off given to patient's nurse.   Access used: Left Thigh graft Access issues: bleeding post HD for arterial site of  Total UF removed: 300cc Medication(s) given: mididrine 10mg  po x 1   Delon LITTIE [facility]

## 2023-07-17 NOTE — Progress Notes (Signed)
 Subjective  Seen in HD. Reports gas and stool via ostomy. Mild right sided abdominal tenderness that is unchanged by PO intake. Denies nausea or vomiting.   Objective: Vital signs in last 24 hours: Temp:  [97.3 F (36.3 C)-98 F (36.7 C)] 98 F (36.7 C) (01/10 0938) Pulse Rate:  [33-100] 59 (01/10 1030) Resp:  [9-26] 26 (01/10 1030) BP: (86-123)/(44-87) 86/47 (01/10 1030) SpO2:  [95 %-100 %] 100 % (01/10 1030) Weight:  [66.3 kg] 66.3 kg (01/10 0938) Last BM Date : 07/16/23  Intake/Output from previous day: 01/09 0701 - 01/10 0700 In: 15  Out: 1640 [Emesis/NG output:100; Drains:40; Stool:1500] Intake/Output this shift: No intake/output data recorded.  Gen: NAD Abd: soft, nontender, stomy viable with liquid stool, some solid sediment, and gas. , ND. JP 40 mL GU: left groin wound dressing c/d/i  Lab Results: CBC  Recent Labs    07/16/23 0630 07/17/23 0324  WBC 12.6* 7.6  HGB 9.2* 8.6*  HCT 27.7* 25.3*  PLT 100* 103*   BMET Recent Labs    07/16/23 0630 07/17/23 0324  NA 134* 134*  K 5.6* 5.2*  CL 89* 86*  CO2 29 33*  GLUCOSE 109* 95  BUN 67* 77*  CREATININE 9.47* 10.16*  CALCIUM  9.8 9.6   PT/INR No results for input(s): LABPROT, INR in the last 72 hours.  ABG No results for input(s): PHART, HCO3 in the last 72 hours.  Invalid input(s): PCO2, PO2  Studies/Results:  Anti-infectives: Anti-infectives (From admission, onward)    Start     Dose/Rate Route Frequency Ordered Stop   07/13/23 1800  meropenem  (MERREM ) 1 g in sodium chloride  0.9 % 100 mL IVPB        1 g 200 mL/hr over 30 Minutes Intravenous Every 24 hours 07/13/23 1751     07/10/23 2200  linezolid  (ZYVOX ) tablet 600 mg  Status:  Discontinued        600 mg Oral Every 12 hours 07/10/23 0952 07/14/23 1411   07/08/23 2200  piperacillin -tazobactam (ZOSYN ) IVPB 2.25 g  Status:  Discontinued        2.25 g 100 mL/hr over 30 Minutes Intravenous Every 8 hours 07/08/23 2124 07/10/23  0952   07/08/23 0600  piperacillin -tazobactam (ZOSYN ) IVPB 3.375 g  Status:  Discontinued        3.375 g 12.5 mL/hr over 240 Minutes Intravenous Every 12 hours 07/08/23 0518 07/08/23 2124   07/08/23 0200  linezolid  (ZYVOX ) IVPB 600 mg  Status:  Discontinued        600 mg 300 mL/hr over 60 Minutes Intravenous Every 12 hours 07/07/23 2117 07/10/23 0952   07/07/23 2300  piperacillin -tazobactam (ZOSYN ) IVPB 2.25 g  Status:  Discontinued        2.25 g 100 mL/hr over 30 Minutes Intravenous Every 8 hours 07/07/23 2121 07/08/23 0517   07/07/23 1415  piperacillin -tazobactam (ZOSYN ) IVPB 3.375 g        3.375 g 100 mL/hr over 30 Minutes Intravenous  Once 07/07/23 1404 07/07/23 1555   07/07/23 1415  clindamycin  (CLEOCIN ) IVPB 900 mg  Status:  Discontinued        900 mg 100 mL/hr over 30 Minutes Intravenous  Once 07/07/23 1404 07/07/23 1414   07/07/23 1415  linezolid  (ZYVOX ) IVPB 600 mg        600 mg 300 mL/hr over 60 Minutes Intravenous STAT 07/07/23 1414 07/07/23 1911        Assessment/Plan: Patient Active Problem List   Diagnosis Date  Noted   Abdominal wall abscess 07/14/2023   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   Anastomotic leak of intestine 07/14/2023   Leukocytosis 07/08/2023   Paroxysmal atrial fibrillation (HCC) 07/08/2023   History of COPD 07/08/2023   Chronic diastolic CHF (congestive heart failure) (HCC) 07/08/2023   History of anemia due to chronic kidney disease 07/08/2023   Abscess of groin, left 07/07/2023   Macrocytic anemia 04/08/2023   Depression 04/08/2023   Perirectal abscess 03/23/2023   Infection due to ESBL-producing Escherichia coli 03/23/2023   GI bleeding 10/07/2022   DOE (dyspnea on exertion) 07/22/2022   Coag negative Staphylococcus bacteremia 06/06/2022   Severe sepsis (HCC) 06/02/2022   Ulcerative colitis (HCC) 06/02/2022   End-stage renal disease on hemodialysis (HCC) 05/06/2022   SBO (small bowel obstruction) (HCC) 11/18/2021   Glaucoma 11/18/2021    Atrial fibrillation, persistent (HCC) 11/18/2021   Orthostatic hypotension 10/22/2021   Idiopathic neuropathy 10/22/2021   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   Attention to urostomy (HCC) 10/22/2021   Ileus vs pSBO - NPO, MIVF per primary/nephro Clinically non-obstructed, tolerating clears. Advanced to renal diet. CCS will sign off. Call as needed.    L groin abscess S/p I&D 12/31 Dr. Ann in clinic    Presacral fluid collection I have reviewed his chart extensively about the presacral fluid collection. It had a drain in it 4 months ago and was treated for infection, it was separated after drain dislodgement which was sterile at the time. On imaging now it is larger. It is not near intestine. It does not appear to track to the left groin. I have reached out to Dr. Leigh, at [facility] who is managing this, to try to coordinate best plans for care of this complex condition. By Dr. Stevie.   - s/p IR drain 1/6, patient will need to follow up with Dr. Leigh at [facility] for this  -we will arrange follow up in our office for a wound check for his groin wound in about 3 weeks.   -no further surgical intervention warranted at this time.  We will be available as needed.   Pressure injury to sacrum -frequent turns to offload the patient -cont currently treatment otherwise   FEN: renal diet, MIVF per primary/nephro ID: on Merrem  per ID VTE: eliquis  on hold for IR procedure.  May resume post procedure when IR oks this.   LOS: 10 days   Almarie Pringle, [facility] Surgery, A [facility]

## 2023-07-17 NOTE — Progress Notes (Signed)
  [facility] Progress Note   Subjective:  Seen on HD - 0.5L UFG and tolerating. BP chronically low. L nephrostomy tube replaced with IR this AM after accidentally dislodging. No CP/dyspnea.  Objective Vitals:   07/17/23 1000 07/17/23 1030 07/17/23 1100 07/17/23 1130  BP: (!) 102/44 (!) 86/47 (!) 91/47 (!) 91/48  Pulse: (!) 45 (!) 59 68 (!) 30  Resp: 14 (!) 26 16 11   Temp:      TempSrc:      SpO2: 100% 100% 99% (!) 58%  Weight:      Height:       Physical Exam General: Well appearing man, NAD. Room air. Heart: RRR; no murmur Lungs: CTA anteriorly Abdomen: soft with slight discomfort on palpation in epigastric area. Ostomy bag with dark brown/watery stool today. Back: JP drain with small amount of serosanguinous fluid in bulb, nephrostomy bag with cloudy/bloody urine. Extremities: no LE edema Dialysis Access:  L thigh AVG + t/b   Additional Objective Labs: Basic Metabolic Panel: Recent Labs  Lab 07/15/23 0358 07/16/23 0630 07/17/23 0324  NA 131* 134* 134*  K 4.6 5.6* 5.2*  CL 88* 89* 86*  CO2 29 29 33*  GLUCOSE 109* 109* 95  BUN 42* 67* 77*  CREATININE 6.94* 9.47* 10.16*  CALCIUM  8.8* 9.8 9.6  PHOS 6.3* 7.8* 7.5*   Liver Function Tests: Recent Labs  Lab 07/13/23 0658 07/13/23 0659 07/15/23 0358 07/16/23 0630 07/17/23 0324  AST 13*  --   --   --   --   ALT 13  --   --   --   --   ALKPHOS 73  --   --   --   --   BILITOT 0.7  --   --   --   --   PROT 6.4*  --   --   --   --   ALBUMIN  2.9*   < > 2.7* 2.9* 2.8*   < > = values in this interval not displayed.   CBC: Recent Labs  Lab 07/13/23 0658 07/14/23 0346 07/15/23 0358 07/16/23 0630 07/17/23 0324  WBC 11.8* 9.4 10.7* 12.6* 7.6  HGB 9.0* 8.9* 8.7* 9.2* 8.6*  HCT 27.0* 26.6* 25.7* 27.7* 25.3*  MCV 123.9* 122.6* 123.0* 123.1* 122.2*  PLT 143* 133* 124* 100* 103*   Studies/Results: IR NEPHROSTOMY EXCHANGE LEFT Result Date: 07/17/2023 INDICATION: CHRONIC INDWELLING LEFT NEPHROSTOMY,  ROUTINE EXCHANGE EXAM: FLUOROSCOPIC LEFT NEPHROSTOMY EXCHANGE COMPARISON:  07/15/2023 MEDICATIONS: 1% LIDOCAINE  LOCAL ANESTHESIA/SEDATION: None. CONTRAST:  10 cc-administered into the collecting system(s) FLUOROSCOPY: Radiation Exposure Index (as provided by the fluoroscopic device): 2.0 mGy Kerma COMPLICATIONS: None immediate. PROCEDURE: Informed written consent was obtained from the patient after a thorough discussion of the procedural risks, benefits and alternatives. All questions were addressed. Maximal Sterile Barrier Technique was utilized including caps, mask, sterile gowns, sterile gloves, sterile drape, hand hygiene and skin antiseptic. A timeout was performed prior to the initiation of the procedure. Under sterile conditions and local anesthesia, the existing retracted 8 French left nephrostomy was exchanged for a new 10 French nephrostomy over an Amplatz guidewire. Retention loop formed in the renal pelvis. Contrast injection confirms position. Images obtained for documentation. Catheter secured with a silk suture. Sterile dressing applied. Gravity drainage bag connected. IMPRESSION: Successful fluoroscopic exchange and upsize of the 10 French left nephrostomy Electronically Signed   By: CHRISTELLA.  Shick M.D.   On: 07/17/2023 08:56   DG Abd 1 View Result Date: 07/15/2023 CLINICAL DATA:  History of  abscess in the left groin abdomen pain EXAM: ABDOMEN - 1 VIEW COMPARISON:  CT 07/07/2023 FINDINGS: Small amount of radiopaque material in the region of the stomach. Ostomy in the right mid quadrant. History of total colectomy. Interval air distension of small bowel measuring up to 4.8 cm. Penile implant with reservoir in the right pelvis. Left abdominal nephrostomy tube. Interim placement pelvic drainage catheter with pigtail at the inferior pelvis. Question kinked appearance of the catheter along the left side wall. IMPRESSION: 1. Interval air distension of small bowel measuring up to 4.8 cm, possible ileus or  obstruction. 2. Interim placement of pelvic drainage catheter with pigtail at the inferior pelvis. Question kinked appearance of the catheter along the left side wall. Electronically Signed   By: Luke Bun M.D.   On: 07/15/2023 16:43   Medications:  anticoagulant sodium citrate      heparin      meropenem  (MERREM ) IV 1 g (07/16/23 1747)   sodium chloride       (feeding supplement) PROSource Plus  30 mL Oral BID BM   acyclovir  ointment   Topical Q3H   amiodarone   200 mg Oral BID   Chlorhexidine  Gluconate Cloth  6 each Topical Q0600   Chlorhexidine  Gluconate Cloth  6 each Topical Q0600   darbepoetin (ARANESP ) injection - DIALYSIS  100 mcg Subcutaneous Q Tue-1800   dorzolamide   1 drop Both Eyes BID   Gerhardt's butt cream   Topical BID   heparin   5,000 Units Dialysis Once in dialysis   latanoprost   1 drop Right Eye QHS   magnesium  oxide  400 mg Oral BID   midodrine   10 mg Oral TID WC   mometasone -formoterol   2 puff Inhalation BID   sertraline   25 mg Oral QHS   sevelamer  carbonate  2,400 mg Oral TID WC   sodium chloride  flush  5 mL Intracatheter Q8H   sodium zirconium cyclosilicate   10 g Oral Daily   timolol   1 drop Both Eyes Daily    Dialysis Orders MWF - [facility] 2:45hr, 350/A1.5, EDW 68.5kg, 2K/2.5Ca, AVG, heparin  5000 unit bolus - Mircera 120mcg IV q 4 weeks (last 12/18) - Calcitriol  0.50mcg PO q HD   Assessment/Plan: Sepsis/groin abscess: S/p I&D 12/31, Blood Cx negative. Finished course of linezolid .  Presacral wound/fluid collection: Drain placed by IR 1/6, Cx grew ESBL E. Coli. On Meropenem . Pharmacy asked us  to see if Ertapenem  could be ordered by his HD unit and given there. [facility] requested to order 1/9, no response yet - presuming will be denied (center reports tried to get for another patient recently and was denied).  ESRD: Usual MWF schedule - refused HD Wed - HD now, 0.5L UFG, 2K. HypoTN/volume: BP low/stable. On mido 10mg  TID. Low UF goal today. Anemia of ESRD: Hgb  8.6, Aranesp  100mcg given 1/7 - continue q Tuesday during admit.  Secondary HPTH: CorrCa high, VDRA remains on hold. Phos high - continue Renvela  and monitor for now (has not getting binders consistently while inpatient). Nutrition: Alb low, continue supplements. A-fib: Hx RVR, better. On amiodarone  + Eliquis .   Izetta Boehringer, PA-C 07/17/2023, 11:43 AM  [facility]

## 2023-07-17 NOTE — Progress Notes (Signed)
 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  He reported boil  started 3 weeks prior to admission, saw PCP who prescribed doxycycline  for 10 days.  Antibiotics did not help.  Patient admitted with groin abscess and  abscess in presacral space. Develops ileus vs SBO. Surgery re-consulted.    Assessment & Plan:   Principal Problem:   Abscess of groin, left Active Problems:   Severe sepsis (HCC)   End-stage renal disease on hemodialysis (HCC)   Depression   Leukocytosis   Paroxysmal atrial fibrillation (HCC)   History of COPD   Chronic diastolic CHF (congestive heart failure) (HCC)   History of anemia due to chronic kidney disease   Abdominal wall abscess   ESBL E. coli carrier   Anastomotic leak of intestine   1-Sepsis secondary to infected groin abscess, and  -Presacral space 5 x 4.5 cm fluid collection extending into left ischial anal fat slightly  increased from 06/11/2023 -Dr. Royal discussed with Dr. Stevie with general surgery on 07/10/2023--IR placed drain to be managed by Dr. Leigh COTA -ID following,= Culture growing ESBL E coli.  -Surgery will determine when to repeat CT scan.   ESRD Monday Wednesday and Friday via [facility] complicated by hypotension Continue with midodrine  Refuse HD today, plan for HD tomorrow.   SBO, Vs Ilues;  Report abdominal pain.  Avoid narcotics.  KUB ileus Vs  Obstruction.  Surgery re-consulted.  KUB with sign of SBO.  Now started to have high out put from ostomy.  IV bolus for increase out put.   A-fib on Eliquis  Had paroxysmal A-fib started on IV amiodarone  subsequently transition to oral, amiodarone  dose  reduced to 200 due to mild bradycardia Follow up outpatient with cardiology  Eliquis  change to heparin  gtt, due to SBO.  Refuse heparin  gtt.   Depression: Continue with Zoloft   Obstructive uropathy with chronic left PCN S/P exchange 2 weeks ago On IV antibiotics.  1/09:Tube leaking, and coming off , IR consulted and informed.  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 Rollin GI--not a clear anoscopy/procedural operative candidate  -  Hypotension, chronic on Midodrine .  Worsening hypotension 1/09  form increase out put form ostomy.  IV bolus given   Previous DVT Not on anticoagulation.  CAD: LHC on 07/22/22 which showed no significant CAD and normal LVEDP.     See wound care documentation below.  Pressure Injury 07/08/23 Buttocks Bilateral Stage 1 -  Intact skin with non-blanchable redness of a localized area usually over a bony prominence. redness due to bleeding / pus wound (Active)  07/08/23 1454  Location: Buttocks  Location Orientation: Bilateral  Staging: Stage 1 -  Intact skin with non-blanchable redness of a localized area usually over a bony prominence.  Wound Description (Comments): redness due to bleeding / pus wound  Present on Admission:  Yes  Dressing Type Foam - Lift dressing to assess site every shift 07/16/23 1930                  Estimated body mass index is 24.32 kg/m as calculated from the following:   Height as of this encounter: 5' 5 (1.651 m).   Weight as of this encounter: 66.3 kg.   DVT prophylaxis:  Eliquis  Code Status: Full code Family Communication: daughter at bedside 1/08  Disposition Plan:  Status is: Inpatient Remains inpatient appropriate because: management of infection    Consultants:  Sx ID   Procedures:  none  Antimicrobials:    Subjective: Seen in HD, mild abdominal pain. Having out put from ostomy  Objective: Vitals:   07/16/23 1715 07/16/23 2026 07/16/23 2355 07/17/23 0344  BP: (!) 108/57 (!) 106/51 (!) 108/55 (!) 107/56  Pulse:  (!) 59 (!) 58 (!) 57  Resp: 11 (!) 9 14 17   Temp: 97.7 F (36.5 C) (!) 97.3 F (36.3 C) (!) 97.3 F (36.3 C) (!) 97.5 F (36.4 C)  TempSrc: Axillary Oral Oral Oral  SpO2:  98% 95% 99%  Weight:    66.3 kg  Height:        Intake/Output Summary (Last 24 hours) at 07/17/2023 0752 Last data filed at 07/17/2023 9364 Gross per 24 hour  Intake 15 ml  Output 1640 ml  Net -1625 ml   Filed Weights   07/15/23 0528 07/16/23 0713 07/17/23 0344  Weight: 69.6 kg 65.6 kg 66.3 kg    Examination:  General exam: NAD Respiratory system: CTA Cardiovascular system:S 1, S 2 RRR Gastrointestinal system: BS present, soft, nt Ostomy bag with fluid Central nervous system: Alert Extremities: Symmetric 5 x 5 power.   Data Reviewed: I have personally reviewed following labs and imaging studies  CBC: Recent Labs  Lab 07/13/23 0658 07/14/23 0346 07/15/23 0358 07/16/23 0630 07/17/23 0324  WBC 11.8* 9.4 10.7* 12.6* 7.6  HGB 9.0* 8.9* 8.7* 9.2* 8.6*  HCT 27.0* 26.6* 25.7* 27.7* 25.3*  MCV 123.9* 122.6* 123.0* 123.1* 122.2*  PLT 143* 133* 124* 100* 103*   Basic Metabolic Panel: Recent Labs  Lab 07/12/23 0326 07/12/23 0927 07/13/23 0659 07/14/23 0346 07/15/23 0358 07/16/23 0630 07/17/23 0324  NA  --    < > 132* 132* 131* 134* 134*  K  --    < > 5.7* 6.1* 4.6 5.6* 5.2*  CL  --    < > 90* 89* 88* 89* 86*  CO2  --    < > 26 27 29 29  33*  GLUCOSE  --    < > 99 96 109* 109* 95  BUN  --    < > 57* 70* 42* 67* 77*  CREATININE   --    < > 9.30* 10.22* 6.94* 9.47* 10.16*  CALCIUM   --    < > 9.0 9.0 8.8* 9.8 9.6  MG 1.7  --   --  2.0  --   --   --   PHOS  --   --  7.0* 8.3* 6.3* 7.8* 7.5*   < > = values in this interval not displayed.   GFR: Estimated Creatinine Clearance: 4.6 mL/min (A) (by C-G formula based on SCr of 10.16 mg/dL (H)). Liver Function Tests: Recent Labs  Lab 07/13/23 9341 07/13/23 0659 07/14/23 0346 07/15/23 0358 07/16/23 0630 07/17/23 0324  AST 13*  --   --   --   --   --   ALT 13  --   --   --   --   --  ALKPHOS 73  --   --   --   --   --   BILITOT 0.7  --   --   --   --   --   PROT 6.4*  --   --   --   --   --   ALBUMIN  2.9* 2.9* 2.7* 2.7* 2.9* 2.8*   No results for input(s): LIPASE, AMYLASE in the last 168 hours. No results for input(s): AMMONIA in the last 168 hours. Coagulation Profile: Recent Labs  Lab 07/13/23 0658  INR 1.1   Cardiac Enzymes: No results for input(s): CKTOTAL, CKMB, CKMBINDEX, TROPONINI in the last 168 hours. BNP (last 3 results) No results for input(s): PROBNP in the last 8760 hours. HbA1C: No results for input(s): HGBA1C in the last 72 hours. CBG: Recent Labs  Lab 07/13/23 2104 07/14/23 0602  GLUCAP 114* 81   Lipid Profile: No results for input(s): CHOL, HDL, LDLCALC, TRIG, CHOLHDL, LDLDIRECT in the last 72 hours. Thyroid Function Tests: No results for input(s): TSH, T4TOTAL, FREET4, T3FREE, THYROIDAB in the last 72 hours. Anemia Panel: No results for input(s): VITAMINB12, FOLATE, FERRITIN, TIBC, IRON, RETICCTPCT in the last 72 hours. Sepsis Labs: No results for input(s): PROCALCITON, LATICACIDVEN in the last 168 hours.  Recent Results (from the past 240 hours)  Blood culture (routine x 2)     Status: None   Collection Time: 07/07/23  2:07 PM   Specimen: BLOOD  Result Value Ref Range Status   Specimen Description BLOOD RIGHT ANTECUBITAL  Final   Special Requests   Final    BOTTLES  DRAWN AEROBIC AND ANAEROBIC Blood Culture adequate volume   Culture   Final    NO GROWTH 5 DAYS Performed at [facility], [address], [place], [place] [address]    Report Status 07/12/2023 FINAL  Final  Blood culture (routine x 2)     Status: None   Collection Time: 07/07/23  2:08 PM   Specimen: BLOOD LEFT FOREARM  Result Value Ref Range Status   Specimen Description BLOOD LEFT FOREARM  Final   Special Requests   Final    BOTTLES DRAWN AEROBIC ONLY Blood Culture results may not be optimal due to an inadequate volume of blood received in culture bottles   Culture   Final    NO GROWTH 5 DAYS Performed at [facility], [address], [place], [place] [address]    Report Status 07/12/2023 FINAL  Final  Resp panel by RT-PCR (RSV, Flu A&B, Covid) Anterior Nasal Swab     Status: None   Collection Time: 07/07/23  3:46 PM   Specimen: Anterior Nasal Swab  Result Value Ref Range Status   SARS Coronavirus 2 by RT PCR NEGATIVE NEGATIVE Final   Influenza A by PCR NEGATIVE NEGATIVE Final   Influenza B by PCR NEGATIVE NEGATIVE Final    Comment: (NOTE) The Xpert Xpress SARS-CoV-2/FLU/RSV plus assay is intended as an aid in the diagnosis of influenza from Nasopharyngeal swab specimens and should not be used as a sole basis for treatment. Nasal washings and aspirates are unacceptable for Xpert Xpress SARS-CoV-2/FLU/RSV testing.  Fact Sheet for Patients: bloggercourse.com  Fact Sheet for Healthcare Providers: seriousbroker.it  This test is not yet approved or cleared by the [place]  FDA and has been authorized for detection and/or diagnosis of SARS-CoV-2 by FDA under an Emergency Use Authorization (EUA). This EUA will remain in effect (meaning this test can be used) for the duration  of the COVID-19 declaration under Section 564(b)(1) of the Act, 21 U.S.C. section 360bbb-3(b)(1), unless the authorization is terminated  or revoked.     Resp Syncytial Virus by PCR NEGATIVE NEGATIVE Final    Comment: (NOTE) Fact Sheet for Patients: bloggercourse.com  Fact Sheet for Healthcare Providers: seriousbroker.it  This test is not yet approved or cleared by the [place]  FDA and has been authorized for detection and/or diagnosis of SARS-CoV-2 by FDA under an Emergency Use Authorization (EUA). This EUA will remain in effect (meaning this test can be used) for the duration of the COVID-19 declaration under Section 564(b)(1) of the Act, 21 U.S.C. section 360bbb-3(b)(1), unless the authorization is terminated or revoked.  Performed at [facility], [address], [place], [place] [address]   Aerobic/Anaerobic Culture w Gram Stain (surgical/deep wound)     Status: None (Preliminary result)   Collection Time: 07/13/23  1:09 PM   Specimen: Abscess  Result Value Ref Range Status   Specimen Description ABSCESS  Final   Special Requests Normal  Final   Gram Stain   Final    ABUNDANT WBC PRESENT, PREDOMINANTLY PMN RARE GRAM POSITIVE COCCI RARE GRAM NEGATIVE RODS    Culture   Final    ABUNDANT ESCHERICHIA COLI Confirmed Extended Spectrum Beta-Lactamase Producer (ESBL).  In bloodstream infections from ESBL organisms, carbapenems are preferred over piperacillin /tazobactam. They are shown to have a lower risk of mortality. NO ANAEROBES ISOLATED Sent to [facility] for further susceptibility testing. Performed at [facility], [address], [place], [place] [address]    Report Status PENDING  Incomplete   Organism ID, Bacteria ESCHERICHIA COLI  Final      Susceptibility   Escherichia coli - MIC*    AMPICILLIN >=32 RESISTANT Resistant     CEFEPIME  >=32 RESISTANT Resistant     CEFTAZIDIME RESISTANT Resistant     CEFTRIAXONE >=64 RESISTANT Resistant     CIPROFLOXACIN >=4 RESISTANT Resistant     GENTAMICIN <=1 SENSITIVE Sensitive     IMIPENEM <=0.25  SENSITIVE Sensitive     TRIMETH/SULFA >=320 RESISTANT Resistant     AMPICILLIN/SULBACTAM >=32 RESISTANT Resistant     PIP/TAZO >=128 RESISTANT Resistant ug/mL    * ABUNDANT ESCHERICHIA COLI         Radiology Studies: DG Abd 1 View Result Date: 07/15/2023 CLINICAL DATA:  History of abscess in the left groin abdomen pain EXAM: ABDOMEN - 1 VIEW COMPARISON:  CT 07/07/2023 FINDINGS: Small amount of radiopaque material in the region of the stomach. Ostomy in the right mid quadrant. History of total colectomy. Interval air distension of small bowel measuring up to 4.8 cm. Penile implant with reservoir in the right pelvis. Left abdominal nephrostomy tube. Interim placement pelvic drainage catheter with pigtail at the inferior pelvis. Question kinked appearance of the catheter along the left side wall. IMPRESSION: 1. Interval air distension of small bowel measuring up to 4.8 cm, possible ileus or obstruction. 2. Interim placement of pelvic drainage catheter with pigtail at the inferior pelvis. Question kinked appearance of the catheter along the left side wall. Electronically Signed   By: Luke Bun M.D.   On: 07/15/2023 16:43        Scheduled Meds:  (feeding supplement) PROSource Plus  30 mL Oral BID BM   acyclovir  ointment   Topical Q3H   amiodarone   200 mg Oral BID   Chlorhexidine  Gluconate Cloth  6 each Topical Q0600   Chlorhexidine  Gluconate Cloth  6 each Topical  Q0600   darbepoetin (ARANESP ) injection - DIALYSIS  100 mcg Subcutaneous Q Tue-1800   dorzolamide   1 drop Both Eyes BID   Gerhardt's butt cream   Topical BID   heparin   5,000 Units Dialysis Once in dialysis   latanoprost   1 drop Right Eye QHS   magnesium  oxide  400 mg Oral BID   midodrine   10 mg Oral TID WC   mometasone -formoterol   2 puff Inhalation BID   sertraline   25 mg Oral QHS   sevelamer  carbonate  2,400 mg Oral TID WC   sodium chloride  flush  5 mL Intracatheter Q8H   sodium zirconium cyclosilicate   10 g Oral Daily    timolol   1 drop Both Eyes Daily   Continuous Infusions:  anticoagulant sodium citrate      heparin      meropenem  (MERREM ) IV 1 g (07/16/23 1747)   sodium chloride        LOS: 10 days    Time spent: 35 Minutes    Tannar Broker A Nickalaus Crooke, MD [facility]   If 7PM-7AM, please contact night-coverage [email or web address]  07/17/2023, 7:52 AM

## 2023-07-17 NOTE — Progress Notes (Signed)
 Subjective: No new complaints   Antibiotics:  Anti-infectives (From admission, onward)    Start     Dose/Rate Route Frequency Ordered Stop   07/13/23 1800  meropenem  (MERREM ) 1 g in sodium chloride  0.9 % 100 mL IVPB        1 g 200 mL/hr over 30 Minutes Intravenous Every 24 hours 07/13/23 1751     07/10/23 2200  linezolid  (ZYVOX ) tablet 600 mg  Status:  Discontinued        600 mg Oral Every 12 hours 07/10/23 0952 07/14/23 1411   07/08/23 2200  piperacillin -tazobactam (ZOSYN ) IVPB 2.25 g  Status:  Discontinued        2.25 g 100 mL/hr over 30 Minutes Intravenous Every 8 hours 07/08/23 2124 07/10/23 0952   07/08/23 0600  piperacillin -tazobactam (ZOSYN ) IVPB 3.375 g  Status:  Discontinued        3.375 g 12.5 mL/hr over 240 Minutes Intravenous Every 12 hours 07/08/23 0518 07/08/23 2124   07/08/23 0200  linezolid  (ZYVOX ) IVPB 600 mg  Status:  Discontinued        600 mg 300 mL/hr over 60 Minutes Intravenous Every 12 hours 07/07/23 2117 07/10/23 0952   07/07/23 2300  piperacillin -tazobactam (ZOSYN ) IVPB 2.25 g  Status:  Discontinued        2.25 g 100 mL/hr over 30 Minutes Intravenous Every 8 hours 07/07/23 2121 07/08/23 0517   07/07/23 1415  piperacillin -tazobactam (ZOSYN ) IVPB 3.375 g        3.375 g 100 mL/hr over 30 Minutes Intravenous  Once 07/07/23 1404 07/07/23 1555   07/07/23 1415  clindamycin  (CLEOCIN ) IVPB 900 mg  Status:  Discontinued        900 mg 100 mL/hr over 30 Minutes Intravenous  Once 07/07/23 1404 07/07/23 1414   07/07/23 1415  linezolid  (ZYVOX ) IVPB 600 mg        600 mg 300 mL/hr over 60 Minutes Intravenous STAT 07/07/23 1414 07/07/23 1911       Medications: Scheduled Meds:  (feeding supplement) PROSource Plus  30 mL Oral BID BM   acyclovir  ointment   Topical Q3H   amiodarone   200 mg Oral BID   Chlorhexidine  Gluconate Cloth  6 each Topical Q0600   Chlorhexidine  Gluconate Cloth  6 each Topical Q0600   darbepoetin (ARANESP ) injection - DIALYSIS  100  mcg Subcutaneous Q Tue-1800   dorzolamide   1 drop Both Eyes BID   Gerhardt's butt cream   Topical BID   latanoprost   1 drop Right Eye QHS   magnesium  oxide  400 mg Oral BID   midodrine   10 mg Oral TID WC   mometasone -formoterol   2 puff Inhalation BID   sertraline   25 mg Oral QHS   sevelamer  carbonate  2,400 mg Oral TID WC   sodium chloride  flush  5 mL Intracatheter Q8H   sodium zirconium cyclosilicate   10 g Oral Daily   timolol   1 drop Both Eyes Daily   Continuous Infusions:  heparin      meropenem  (MERREM ) IV 1 g (07/16/23 1747)   sodium chloride      PRN Meds:.acetaminophen  **OR** acetaminophen , albuterol , alum & mag hydroxide-simeth, melatonin, metoprolol  tartrate, ondansetron  (ZOFRAN ) IV, mouth rinse, oxyCODONE -acetaminophen , traMADol     Objective: Weight change:   Intake/Output Summary (Last 24 hours) at 07/17/2023 1457 Last data filed at 07/17/2023 1400 Gross per 24 hour  Intake 135 ml  Output 1470 ml  Net -1335 ml   Blood pressure (!) 105/51, pulse  64, temperature 97.6 F (36.4 C), resp. rate 15, height 5' 5 (1.651 m), weight 66 kg, SpO2 100%. Temp:  [97.3 F (36.3 C)-98 F (36.7 C)] 97.6 F (36.4 C) (01/10 1240) Pulse Rate:  [30-100] 64 (01/10 1240) Resp:  [9-30] 15 (01/10 1334) BP: (86-123)/(44-97) 105/51 (01/10 1334) SpO2:  [58 %-100 %] 100 % (01/10 1334) Weight:  [66 kg-66.3 kg] 66 kg (01/10 1240)  Physical Exam: Physical Exam Constitutional:      Appearance: He is well-developed.  HENT:     Head: Normocephalic and atraumatic.  Eyes:     Conjunctiva/sclera: Conjunctivae normal.  Cardiovascular:     Rate and Rhythm: Normal rate and regular rhythm.  Pulmonary:     Effort: Pulmonary effort is normal. No respiratory distress.     Breath sounds: No wheezing.  Abdominal:     General: There is no distension.     Palpations: Abdomen is soft.  Musculoskeletal:        General: Normal range of motion.     Cervical back: Normal range of motion and neck  supple.  Skin:    General: Skin is warm and dry.     Findings: No erythema or rash.  Neurological:     General: No focal deficit present.     Mental Status: He is alert and oriented to person, place, and time.  Psychiatric:        Mood and Affect: Mood normal.        Behavior: Behavior normal.        Thought Content: Thought content normal.        Judgment: Judgment normal.     Drain with purulent material CBC:    BMET Recent Labs    07/16/23 0630 07/17/23 0324  NA 134* 134*  K 5.6* 5.2*  CL 89* 86*  CO2 29 33*  GLUCOSE 109* 95  BUN 67* 77*  CREATININE 9.47* 10.16*  CALCIUM  9.8 9.6     Liver Panel  Recent Labs    07/16/23 0630 07/17/23 0324  ALBUMIN  2.9* 2.8*       Sedimentation Rate No results for input(s): ESRSEDRATE in the last 72 hours. C-Reactive Protein No results for input(s): CRP in the last 72 hours.  Micro Results: Recent Results (from the past 720 hours)  Blood culture (routine x 2)     Status: None   Collection Time: 07/07/23  2:07 PM   Specimen: BLOOD  Result Value Ref Range Status   Specimen Description BLOOD RIGHT ANTECUBITAL  Final   Special Requests   Final    BOTTLES DRAWN AEROBIC AND ANAEROBIC Blood Culture adequate volume   Culture   Final    NO GROWTH 5 DAYS Performed at [facility], [address] [place], [place] [address]    Report Status 07/12/2023 FINAL  Final  Blood culture (routine x 2)     Status: None   Collection Time: 07/07/23  2:08 PM   Specimen: BLOOD LEFT FOREARM  Result Value Ref Range Status   Specimen Description BLOOD LEFT FOREARM  Final   Special Requests   Final    BOTTLES DRAWN AEROBIC ONLY Blood Culture results may not be optimal due to an inadequate volume of blood received in culture bottles   Culture   Final    NO GROWTH 5 DAYS Performed at [facility], [address] [place], [place] [address]    Report Status 07/12/2023 FINAL  Final  Resp panel by RT-PCR (  RSV, Flu A&B,  Covid) Anterior Nasal Swab     Status: None   Collection Time: 07/07/23  3:46 PM   Specimen: Anterior Nasal Swab  Result Value Ref Range Status   SARS Coronavirus 2 by RT PCR NEGATIVE NEGATIVE Final   Influenza A by PCR NEGATIVE NEGATIVE Final   Influenza B by PCR NEGATIVE NEGATIVE Final    Comment: (NOTE) The Xpert Xpress SARS-CoV-2/FLU/RSV plus assay is intended as an aid in the diagnosis of influenza from Nasopharyngeal swab specimens and should not be used as a sole basis for treatment. Nasal washings and aspirates are unacceptable for Xpert Xpress SARS-CoV-2/FLU/RSV testing.  Fact Sheet for Patients: bloggercourse.com  Fact Sheet for Healthcare Providers: seriousbroker.it  This test is not yet approved or cleared by the [place]  FDA and has been authorized for detection and/or diagnosis of SARS-CoV-2 by FDA under an Emergency Use Authorization (EUA). This EUA will remain in effect (meaning this test can be used) for the duration of the COVID-19 declaration under Section 564(b)(1) of the Act, 21 U.S.C. section 360bbb-3(b)(1), unless the authorization is terminated or revoked.     Resp Syncytial Virus by PCR NEGATIVE NEGATIVE Final    Comment: (NOTE) Fact Sheet for Patients: bloggercourse.com  Fact Sheet for Healthcare Providers: seriousbroker.it  This test is not yet approved or cleared by the [place]  FDA and has been authorized for detection and/or diagnosis of SARS-CoV-2 by FDA under an Emergency Use Authorization (EUA). This EUA will remain in effect (meaning this test can be used) for the duration of the COVID-19 declaration under Section 564(b)(1) of the Act, 21 U.S.C. section 360bbb-3(b)(1), unless the authorization is terminated or revoked.  Performed at [facility], [address], [place], [place] [address]   Aerobic/Anaerobic Culture w Gram  Stain (surgical/deep wound)     Status: None (Preliminary result)   Collection Time: 07/13/23  1:09 PM   Specimen: Abscess  Result Value Ref Range Status   Specimen Description ABSCESS  Final   Special Requests Normal  Final   Gram Stain   Final    ABUNDANT WBC PRESENT, PREDOMINANTLY PMN RARE GRAM POSITIVE COCCI RARE GRAM NEGATIVE RODS    Culture   Final    ABUNDANT ESCHERICHIA COLI Confirmed Extended Spectrum Beta-Lactamase Producer (ESBL).  In bloodstream infections from ESBL organisms, carbapenems are preferred over piperacillin /tazobactam. They are shown to have a lower risk of mortality. NO ANAEROBES ISOLATED Sent to [facility] for further susceptibility testing. Performed at [facility], [address] [place], [place] [address]    Report Status PENDING  Incomplete   Organism ID, Bacteria ESCHERICHIA COLI  Final      Susceptibility   Escherichia coli - MIC*    AMPICILLIN >=32 RESISTANT Resistant     CEFEPIME  >=32 RESISTANT Resistant     CEFTAZIDIME RESISTANT Resistant     CEFTRIAXONE >=64 RESISTANT Resistant     CIPROFLOXACIN >=4 RESISTANT Resistant     GENTAMICIN <=1 SENSITIVE Sensitive     IMIPENEM <=0.25 SENSITIVE Sensitive     TRIMETH/SULFA >=320 RESISTANT Resistant     AMPICILLIN/SULBACTAM >=32 RESISTANT Resistant     PIP/TAZO >=128 RESISTANT Resistant ug/mL    * ABUNDANT ESCHERICHIA COLI    Studies/Results: IR NEPHROSTOMY EXCHANGE LEFT Result Date: 07/17/2023 INDICATION: CHRONIC INDWELLING LEFT NEPHROSTOMY, ROUTINE EXCHANGE EXAM: FLUOROSCOPIC LEFT NEPHROSTOMY EXCHANGE COMPARISON:  07/15/2023 MEDICATIONS: 1% LIDOCAINE  LOCAL ANESTHESIA/SEDATION: None. CONTRAST:  10 cc-administered into the collecting system(s) FLUOROSCOPY: Radiation Exposure Index (as provided  by the fluoroscopic device): 2.0 mGy Kerma COMPLICATIONS: None immediate. PROCEDURE: Informed written consent was obtained from the patient after a thorough discussion of the procedural risks, benefits and  alternatives. All questions were addressed. Maximal Sterile Barrier Technique was utilized including caps, mask, sterile gowns, sterile gloves, sterile drape, hand hygiene and skin antiseptic. A timeout was performed prior to the initiation of the procedure. Under sterile conditions and local anesthesia, the existing retracted 8 French left nephrostomy was exchanged for a new 10 French nephrostomy over an Amplatz guidewire. Retention loop formed in the renal pelvis. Contrast injection confirms position. Images obtained for documentation. Catheter secured with a silk suture. Sterile dressing applied. Gravity drainage bag connected. IMPRESSION: Successful fluoroscopic exchange and upsize of the 10 French left nephrostomy Electronically Signed   By: CHRISTELLA.  Shick M.D.   On: 07/17/2023 08:56      Assessment/Plan:  INTERVAL HISTORY: ESBL again from now thick purulent material in the pre-sacral   Principal Problem:   Abscess of abdominal cavity (HCC) Active Problems:   End-stage renal disease on hemodialysis (HCC)   Severe sepsis (HCC)   Depression   Abscess of groin, left   Leukocytosis   Paroxysmal atrial fibrillation (HCC)   History of COPD   Chronic diastolic CHF (congestive heart failure) (HCC)   History of anemia due to chronic kidney disease   Abdominal wall abscess   ESBL E. coli carrier   Anastomotic leak of intestine    Joshua Riley is a 88 y.o. male with should have colitis ulcerative colitis status post surgery with complications described in prior notes, obstructive uropathy status post percutaneous nephropathy NSTEMI tubes now with 1 on the left, now end-stage renal disease on hemodialysis now admitted with groin abscess after I&D with central, surgery but also with a presacral abscess that is recurred now status post IR guided drain placement with ESBL growing yet again.  #1 Groin abscess seems resolved  #2 Pre-sacral abscess:  I suspect that this is being fed by an  anastomotic leak but even if that is the case it would not change management as his [facility] surgeon has been emphatic that he is not a surgical candidate  We will use meropenem  currently  We are sending omadacycline S but they will take 2 weeks  We have endeavored to get this for patient to be given with HD post HD but that sounds like it is not going to happen  Other things would include just charged to skilled nursing facility with ertapenem  500 g being given once daily via peripheral IV  Another option would be for him to go to the infusion center after dialysis on dialysis days and get 1 g of ertapenem  via peripheral IV there.  Either way I would make sure that he had 4 weeks of effective IV therapy and hopefully in the interim we find that it is also susceptible to amount of cycling.  This issue is going to recur and not 1 that we will be able to resolve with antibiotics and we will have to treat in a reactive fashion  #3 ESRD on HD: His have quite limited options for IV access as far as central lines currently with femoral HD catheter therefore I think it would not be doable to place a central line to give him ertapenem  and this would risk scarring that alignment line as well.  Will take him off our rounding list for now.  When he nears discharge please out to our inpatient infectious  disease team.    LOS: 10 days   Joshua Riley 07/17/2023, 2:57 PM

## 2023-07-17 NOTE — Progress Notes (Signed)
 Physical Therapy Treatment Patient Details Name: Joshua Riley MRN: [id number] DOB: Jan 09, 1936 Today's Date: 07/17/2023   History of Present Illness 88 y.o. male admitted 07/07/23 with sepsis secondary to L groin abscess. S/p L groin I&D on 12/31. Pt with presacral fluid collection s/p L transgluteal drain placement on 1/6. Also with sacral pressure injury. PMH includes ESRD (HD MWF), ulcerative colitics s/p ileostomy, uretal injuries requiring bilateral PCNs, PAF on Eliquis , COPD, CHF, anemia, depression.   PT Comments  Pt slowly progressing with mobility; able to transfer with RW and CGA, though standing and ambulation limited by symptomatic hypotension (BP 71/53 post-standing with presyncopal symptoms). Increased time discussing safe d/c plan; pt adamant about return home, reports intermittent assist available from daughter. Pt remains limited by generalized weakness, decreased activity tolerance, and impaired balance strategies/postural reactions. Pt declines recommendation for SNF, therefore would benefit from HHPT to maximize functional mobility and independence upon return home.     If plan is discharge home, recommend the following: A little help with walking and/or transfers;A little help with bathing/dressing/bathroom;Assist for transportation;Assistance with cooking/housework;Help with stairs or ramp for entrance   Can travel by private vehicle     Yes (pending BP control)  Equipment Recommendations  None recommended by PT (owns RW, w/c, shower seat)    Recommendations for Other Services       Precautions / Restrictions Precautions Precautions: Fall;Other (comment) Precaution Comments: L nephrostomy tube, L gluteal JP drain; watch BP (hypotensive) Restrictions Weight Bearing Restrictions Per Provider Order: No     Mobility  Bed Mobility Overal bed mobility: Modified Independent Bed Mobility: Supine to Sit, Sit to Supine           General bed mobility comments: 2x  supine<>sit mod indep with HOB elevated    Transfers Overall transfer level: Needs assistance Equipment used: Rolling walker (2 wheels) Transfers: Sit to/from Stand Sit to Stand: Contact guard assist           General transfer comment: some impulsivity to stand, CGA for balance    Ambulation/Gait               General Gait Details:  (deferred secondary to symptomatic hypotension)   Stairs             Wheelchair Mobility     Tilt Bed    Modified Rankin (Stroke Patients Only)       Balance Overall balance assessment: Needs assistance Sitting-balance support: Feet supported, No upper extremity supported Sitting balance-Leahy Scale: Fair     Standing balance support: Reliant on assistive device for balance Standing balance-Leahy Scale: Poor                              Cognition Arousal: Alert Behavior During Therapy: Flat affect Overall Cognitive Status: Within Functional Limits for tasks assessed                                 General Comments: pt initially frustrated by being woken up and lack of sleep, but ultimately willing to participate        Exercises      General Comments General comments (skin integrity, edema, etc.): BP down to 78/61 standing, then 71/53 sitting EOB requiring return to sit with pt c/o lightheadedness and the blood is rushing from my head. symptoms improved once supine with BP 118/70. increased time discussing safe d/c  plan; pt reports plan to d/c home, not interested in SNF even for long term IV abx management; reports daughter able to provide intermittent support, agreeable to [facility] services; owns necessary DME      Pertinent Vitals/Pain Pain Assessment Pain Assessment: Faces Faces Pain Scale: Hurts little more Pain Location: generalized, including abdomen and back Pain Descriptors / Indicators: Discomfort Pain Intervention(s): Monitored during session, Limited activity within patient's  tolerance    Home Living                          Prior Function            PT Goals (current goals can now be found in the care plan section) Acute Rehab PT Goals Patient Stated Goal: return home PT Goal Formulation: With patient Time For Goal Achievement: 07/29/23 Potential to Achieve Goals: Fair Progress towards PT goals: Not progressing toward goals - comment (limited by symptomatic hypotension)    Frequency    Min 1X/week      PT Plan      Co-evaluation              AM-PAC PT 6 Clicks Mobility   Outcome Measure  Help needed turning from your back to your side while in a flat bed without using bedrails?: A Little Help needed moving from lying on your back to sitting on the side of a flat bed without using bedrails?: A Little Help needed moving to and from a bed to a chair (including a wheelchair)?: A Little Help needed standing up from a chair using your arms (e.g., wheelchair or bedside chair)?: A Little Help needed to walk in hospital room?: Total Help needed climbing 3-5 steps with a railing? : Total 6 Click Score: 14    End of Session   Activity Tolerance: Treatment limited secondary to medical complications (Comment) (symptomatic hypotension) Patient left: in bed;with call bell/phone within reach;with bed alarm set Nurse Communication: Mobility status;Other (comment) (symptomatic hypotension) PT Visit Diagnosis: Unsteadiness on feet (R26.81);Muscle weakness (generalized) (M62.81)     Time: 8393-8370 PT Time Calculation (min) (ACUTE ONLY): 23 min  Charges:    $Therapeutic Activity: 8-22 mins $Self Care/Home Management: 8-22 PT General Charges $$ ACUTE PT VISIT: 1 Visit                     Darice Almas, PT, DPT Acute Rehabilitation Services  Personal: Secure Chat Rehab Office: [phone]  Darice LITTIE Almas 07/17/2023, 5:12 PM

## 2023-07-17 NOTE — Procedures (Signed)
 Interventional Radiology Procedure Note  Procedure: FLUORO LEFT PCN EXCHG    Complications: None  Estimated Blood Loss:  0  Findings: 10FR PCN REPLACED    Sharen Counter, MD

## 2023-07-18 DIAGNOSIS — L02214 Cutaneous abscess of groin: Secondary | ICD-10-CM | POA: Diagnosis not present

## 2023-07-18 LAB — CBC
HCT: 23.3 % — ABNORMAL LOW (ref 39.0–52.0)
Hemoglobin: 7.9 g/dL — ABNORMAL LOW (ref 13.0–17.0)
MCH: 41.4 pg — ABNORMAL HIGH (ref 26.0–34.0)
MCHC: 33.9 g/dL (ref 30.0–36.0)
MCV: 122 fL — ABNORMAL HIGH (ref 80.0–100.0)
Platelets: 95 10*3/uL — ABNORMAL LOW (ref 150–400)
RBC: 1.91 MIL/uL — ABNORMAL LOW (ref 4.22–5.81)
RDW: 14.5 % (ref 11.5–15.5)
WBC: 7.9 10*3/uL (ref 4.0–10.5)
nRBC: 0 % (ref 0.0–0.2)

## 2023-07-18 LAB — RENAL FUNCTION PANEL
Albumin: 2.8 g/dL — ABNORMAL LOW (ref 3.5–5.0)
Anion gap: 13 (ref 5–15)
BUN: 40 mg/dL — ABNORMAL HIGH (ref 8–23)
CO2: 30 mmol/L (ref 22–32)
Calcium: 8.9 mg/dL (ref 8.9–10.3)
Chloride: 90 mmol/L — ABNORMAL LOW (ref 98–111)
Creatinine, Ser: 6.52 mg/dL — ABNORMAL HIGH (ref 0.61–1.24)
GFR, Estimated: 8 mL/min — ABNORMAL LOW (ref >60)
Glucose, Bld: 92 mg/dL (ref 70–99)
Phosphorus: 4.9 mg/dL — ABNORMAL HIGH (ref 2.5–4.6)
Potassium: 4.2 mmol/L (ref 3.5–5.1)
Sodium: 133 mmol/L — ABNORMAL LOW (ref 135–145)

## 2023-07-18 MED ORDER — NYSTATIN 100000 UNIT/GM EX POWD
Freq: Three times a day (TID) | CUTANEOUS | Status: DC
  Filled 2023-07-18: qty 15

## 2023-07-18 MED ORDER — APIXABAN 2.5 MG PO TABS
2.5000 mg | ORAL_TABLET | Freq: Two times a day (BID) | ORAL | Status: DC
  Administered 2023-07-18 – 2023-07-19 (×4): 2.5 mg via ORAL
  Filled 2023-07-18 (×4): qty 1

## 2023-07-18 MED ORDER — FLUCONAZOLE 100 MG PO TABS
100.0000 mg | ORAL_TABLET | Freq: Every day | ORAL | Status: AC
  Administered 2023-07-18 – 2023-07-21 (×4): 100 mg via ORAL
  Filled 2023-07-18 (×4): qty 1

## 2023-07-18 MED ORDER — PANTOPRAZOLE SODIUM 40 MG PO TBEC
40.0000 mg | DELAYED_RELEASE_TABLET | Freq: Two times a day (BID) | ORAL | Status: DC
  Administered 2023-07-18 – 2023-07-21 (×7): 40 mg via ORAL
  Filled 2023-07-18 (×7): qty 1

## 2023-07-18 MED ORDER — FERROUS SULFATE 325 (65 FE) MG PO TABS
325.0000 mg | ORAL_TABLET | Freq: Every day | ORAL | Status: DC
  Administered 2023-07-18 – 2023-07-21 (×4): 325 mg via ORAL
  Filled 2023-07-18 (×4): qty 1

## 2023-07-18 MED ORDER — FOLIC ACID 1 MG PO TABS
1.0000 mg | ORAL_TABLET | Freq: Every day | ORAL | Status: DC
  Administered 2023-07-18 – 2023-07-21 (×4): 1 mg via ORAL
  Filled 2023-07-18 (×4): qty 1

## 2023-07-18 MED ORDER — SEVELAMER CARBONATE 2.4 G PO PACK
2.4000 g | PACK | Freq: Three times a day (TID) | ORAL | Status: DC
  Administered 2023-07-18 – 2023-07-20 (×6): 2.4 g via ORAL
  Filled 2023-07-18 (×12): qty 1

## 2023-07-18 NOTE — Progress Notes (Signed)
 [facility] Progress Note   Subjective:   Patient seen and examined at bedside.  Reports issue swallowing Renvela .  States he has to cut them in half and they still feel like they get stuck a lot.  Otherwise no specific complaints.  Denies fever, chills, CP, SOB, abdominal pain and n/v/d.  Objective Vitals:   07/18/23 0408 07/18/23 0739 07/18/23 0833 07/18/23 1245  BP: (!) 106/53 (!) 106/46  (!) 92/58  Pulse: 62 62 70   Resp: 18 14 14 20   Temp: 97.8 F (36.6 C) (!) 97.5 F (36.4 C)    TempSrc: Oral Oral    SpO2: 96% 91%    Weight: 68.9 kg     Height:       Physical Exam General:Chronically ill appearing male in NAD Heart:RRR, no mrg Lungs:CTAB, nml WOB on RA Abdomen:soft, NTND Extremities:no LE edema Dialysis Access: L thigh AVG +b/t   Filed Weights   07/17/23 0938 07/17/23 1240 07/18/23 0408  Weight: 66.3 kg 66 kg 68.9 kg    Intake/Output Summary (Last 24 hours) at 07/18/2023 1345 Last data filed at 07/18/2023 0524 Gross per 24 hour  Intake 315 ml  Output 605 ml  Net -290 ml    Additional Objective Labs: Basic Metabolic Panel: Recent Labs  Lab 07/16/23 0630 07/17/23 0324 07/18/23 0250  NA 134* 134* 133*  K 5.6* 5.2* 4.2  CL 89* 86* 90*  CO2 29 33* 30  GLUCOSE 109* 95 92  BUN 67* 77* 40*  CREATININE 9.47* 10.16* 6.52*  CALCIUM  9.8 9.6 8.9  PHOS 7.8* 7.5* 4.9*   Liver Function Tests: Recent Labs  Lab 07/13/23 0658 07/13/23 0659 07/16/23 0630 07/17/23 0324 07/18/23 0250  AST 13*  --   --   --   --   ALT 13  --   --   --   --   ALKPHOS 73  --   --   --   --   BILITOT 0.7  --   --   --   --   PROT 6.4*  --   --   --   --   ALBUMIN  2.9*   < > 2.9* 2.8* 2.8*   < > = values in this interval not displayed.   CBC: Recent Labs  Lab 07/14/23 0346 07/15/23 0358 07/16/23 0630 07/17/23 0324 07/18/23 0250  WBC 9.4 10.7* 12.6* 7.6 7.9  HGB 8.9* 8.7* 9.2* 8.6* 7.9*  HCT 26.6* 25.7* 27.7* 25.3* 23.3*  MCV 122.6* 123.0* 123.1* 122.2*  122.0*  PLT 133* 124* 100* 103* 95*   Blood Culture    Component Value Date/Time   SDES ABSCESS 07/13/2023 1309   SPECREQUEST Normal 07/13/2023 1309   CULT  07/13/2023 1309    ABUNDANT ESCHERICHIA COLI Confirmed Extended Spectrum Beta-Lactamase Producer (ESBL).  In bloodstream infections from ESBL organisms, carbapenems are preferred over piperacillin /tazobactam. They are shown to have a lower risk of mortality. NO ANAEROBES ISOLATED Sent to [facility] for further susceptibility testing. Performed at [facility], [address], [place], [place] [address]    REPTSTATUS PENDING 07/13/2023 1309    Cardiac Enzymes: No results for input(s): CKTOTAL, CKMB, CKMBINDEX, TROPONINI in the last 168 hours. CBG: Recent Labs  Lab 07/13/23 2104 07/14/23 0602  GLUCAP 114* 81   Iron Studies: No results for input(s): IRON, TIBC, TRANSFERRIN, FERRITIN in the last 72 hours. Lab Results  Component Value Date   INR 1.1 07/13/2023   INR 1.3 (H) 06/02/2022  Studies/Results: IR NEPHROSTOMY EXCHANGE LEFT Result Date: 07/17/2023 INDICATION: CHRONIC INDWELLING LEFT NEPHROSTOMY, ROUTINE EXCHANGE EXAM: FLUOROSCOPIC LEFT NEPHROSTOMY EXCHANGE COMPARISON:  07/15/2023 MEDICATIONS: 1% LIDOCAINE  LOCAL ANESTHESIA/SEDATION: None. CONTRAST:  10 cc-administered into the collecting system(s) FLUOROSCOPY: Radiation Exposure Index (as provided by the fluoroscopic device): 2.0 mGy Kerma COMPLICATIONS: None immediate. PROCEDURE: Informed written consent was obtained from the patient after a thorough discussion of the procedural risks, benefits and alternatives. All questions were addressed. Maximal Sterile Barrier Technique was utilized including caps, mask, sterile gowns, sterile gloves, sterile drape, hand hygiene and skin antiseptic. A timeout was performed prior to the initiation of the procedure. Under sterile conditions and local anesthesia, the existing retracted 8 French left nephrostomy was  exchanged for a new 10 French nephrostomy over an Amplatz guidewire. Retention loop formed in the renal pelvis. Contrast injection confirms position. Images obtained for documentation. Catheter secured with a silk suture. Sterile dressing applied. Gravity drainage bag connected. IMPRESSION: Successful fluoroscopic exchange and upsize of the 10 French left nephrostomy Electronically Signed   By: CHRISTELLA.  Shick M.D.   On: 07/17/2023 08:56    Medications:  meropenem  (MERREM ) IV 1 g (07/17/23 1744)   sodium chloride       (feeding supplement) PROSource Plus  30 mL Oral BID BM   acyclovir  ointment   Topical Q3H   amiodarone   200 mg Oral BID   apixaban   2.5 mg Oral BID   Chlorhexidine  Gluconate Cloth  6 each Topical Q0600   Chlorhexidine  Gluconate Cloth  6 each Topical Q0600   darbepoetin (ARANESP ) injection - DIALYSIS  100 mcg Subcutaneous Q Tue-1800   dorzolamide   1 drop Both Eyes BID   ferrous sulfate   325 mg Oral Q breakfast   folic acid   1 mg Oral Daily   Gerhardt's butt cream   Topical BID   latanoprost   1 drop Right Eye QHS   magnesium  oxide  400 mg Oral BID   midodrine   10 mg Oral TID WC   mometasone -formoterol   2 puff Inhalation BID   pantoprazole   40 mg Oral BID   sertraline   25 mg Oral QHS   sevelamer  carbonate  2.4 g Oral TID WC   sodium chloride  flush  5 mL Intracatheter Q8H   sodium zirconium cyclosilicate   10 g Oral Daily   timolol   1 drop Both Eyes Daily    Dialysis Orders: MWF - [facility] 2:45hr, 350/A1.5, EDW 68.5kg, 2K/2.5Ca, AVG, heparin  5000 unit bolus - Mircera 120mcg IV q 4 weeks (last 12/18) - Calcitriol  0.23mcg PO q HD   Assessment/Plan: Sepsis/groin abscess: S/p I&D 12/31, Blood Cx negative. Finished course of linezolid .  Presacral wound/fluid collection: Drain placed by IR 1/6, Cx grew ESBL E. Coli. On Meropenem . Pharmacy asked us  to see if Ertapenem  could be ordered by his HD unit and given there. [facility] center requested to order 1/9, no response yet - presuming will be  denied (center reports tried to get for another patient recently and was denied). Called to follow up today and center has not heard anything yet. Per ID if unable to obtain at HD will need to have peripheral IV to get antibiotic dosed either at SNF or IV infusion clinic post HD.  ESRD: Usual MWF schedule - refused HD Wed, HD yesterday.  Next HD on 07/19/22.  HypoTN/volume: BP low/stable. On mido 10mg  TID. Low UF goal today. Anemia of ESRD: Hgb 7.9, Aranesp  given 1/7 - continue q Tuesday during admit.  Secondary HPTH: CorrCa high, VDRA remains  on hold. Phos high - Changed renvela  to powder due to patient having difficultly swallowing pill.   Nutrition: Alb low, continue supplements. A-fib: Hx RVR, better. On amiodarone  + Eliquis .  Manuelita Labella, PA-C [facility] 07/18/2023,1:45 PM  LOS: 11 days

## 2023-07-18 NOTE — Progress Notes (Signed)
  Progress Note    07/18/2023 10:06 AM * No surgery found *  Subjective: No new issues, wants to go home  Vitals:   07/18/23 0739 07/18/23 0833  BP: (!) 106/46   Pulse: 62 70  Resp: 14 14  Temp: (!) 97.5 F (36.4 C)   SpO2: 91%     Physical Exam: Left thigh AV graft without evidence of infection and with strong palpable thrill  CBC    Component Value Date/Time   WBC 7.9 07/18/2023 0250   RBC 1.91 (L) 07/18/2023 0250   HGB 7.9 (L) 07/18/2023 0250   HGB 10.9 (L) 04/10/2023 1541   HCT 23.3 (L) 07/18/2023 0250   HCT 33.2 (L) 04/10/2023 1541   PLT 95 (L) 07/18/2023 0250   PLT 207 04/10/2023 1541   MCV 122.0 (H) 07/18/2023 0250   MCV 119 (H) 04/10/2023 1541   MCH 41.4 (H) 07/18/2023 0250   MCHC 33.9 07/18/2023 0250   RDW 14.5 07/18/2023 0250   RDW 13.2 04/10/2023 1541   LYMPHSABS 0.8 07/08/2023 0527   LYMPHSABS 0.8 04/10/2023 1541   MONOABS 0.9 07/08/2023 0527   EOSABS 0.1 07/08/2023 0527   EOSABS 0.3 04/10/2023 1541   BASOSABS 0.0 07/08/2023 0527   BASOSABS 0.1 04/10/2023 1541    BMET    Component Value Date/Time   NA 133 (L) 07/18/2023 0250   NA 138 04/10/2023 1541   K 4.2 07/18/2023 0250   CL 90 (L) 07/18/2023 0250   CO2 30 07/18/2023 0250   GLUCOSE 92 07/18/2023 0250   BUN 40 (H) 07/18/2023 0250   BUN 19 04/10/2023 1541   CREATININE 6.52 (H) 07/18/2023 0250   CALCIUM  8.9 07/18/2023 0250   GFRNONAA 8 (L) 07/18/2023 0250    INR    Component Value Date/Time   INR 1.1 07/13/2023 0658     Intake/Output Summary (Last 24 hours) at 07/18/2023 1006 Last data filed at 07/18/2023 0524 Gross per 24 hour  Intake 315 ml  Output 905 ml  Net -590 ml     Assessment/plan:  88 y.o. male is s/p debridement of left groin abscess and IR drainage of presacral abscess with concern for infection of left thigh AV graft.  There is fluid around the graft on CT which has been stable graft does not appear involved with infection as there is no erythema and the graft  continues to work well.  As such I would not recommend any intervention at this time and he will continue antibiotics per ID.  Vascular surgery will be available as needed.   Misako Roeder C. Sheree, MD [facility] Office: [phone] Pager: [phone]  07/18/2023 10:06 AM

## 2023-07-18 NOTE — Progress Notes (Signed)
 PHARMACY - ANTICOAGULATION CONSULT NOTE  Pharmacy Consult for heparin  Indication: atrial fibrillation  Allergies  Allergen Reactions   Cephalosporins Rash   Ciprofloxacin Itching and Rash   Baclofen Other (See Comments)    Altered mental status, after accidental overdose      Patient Measurements: Height: 5' 5 (165.1 cm) Weight: 68.9 kg (151 lb 14.4 oz) IBW/kg (Calculated) : 61.5 HEPARIN  DW (KG): 67.1   Vital Signs: Temp: 97.5 F (36.4 C) (01/11 0739) Temp Source: Oral (01/11 0739) BP: 106/46 (01/11 0739) Pulse Rate: 62 (01/11 0739)  Labs: Recent Labs    07/16/23 0630 07/16/23 1901 07/17/23 0324 07/18/23 0250  HGB 9.2*  --  8.6* 7.9*  HCT 27.7*  --  25.3* 23.3*  PLT 100*  --  103* 95*  APTT 31 32 32  --   HEPARINUNFRC >1.10*  --  0.89*  --   CREATININE 9.47*  --  10.16* 6.52*    Estimated Creatinine Clearance: 7.2 mL/min (A) (by C-G formula based on SCr of 6.52 mg/dL (H)).   Medical History: 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     Ileostomy in place [facility])    Obstructive uropathy    With chronic left nephrostomy   Old retinal detachment, total or subtotal    Orthostatic hypotension 10/22/2021   Sleep apnea    does not need a machine   Stroke (HCC)    Ulcerative colitis (HCC)    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 He is noted with SBO vs ileus and pharmacy consulted to dose heparin .  Patient continues to refuse heparin  drip despite multiple team members  discussing it with him.   Goal of Therapy:  Heparin  level 0.3-0.7 units/ml aPTT 66-102 seconds Monitor platelets by anticoagulation protocol: Yes   Plan:   Hold heparin  per patient  F/u restart apixaban  when SBO resolved   Harlene Mandril, PharmD PGY1 Pharmacy Resident 07/18/2023 7:43 AM

## 2023-07-18 NOTE — Progress Notes (Signed)
 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  He reported boil  started 3 weeks prior to admission, saw PCP who prescribed doxycycline  for 10 days.  Antibiotics did not help.  Patient admitted with groin abscess and  abscess in presacral space. Develops ileus vs SBO. Surgery re-consulted.    Assessment & Plan:   Principal Problem:   Abscess of abdominal cavity (HCC) Active Problems:   Severe sepsis (HCC)   End-stage renal disease on hemodialysis (HCC)   Depression   Abscess of groin, left   Leukocytosis   Paroxysmal atrial fibrillation (HCC)   History of COPD   Chronic diastolic CHF (congestive heart failure) (HCC)   History of anemia due to chronic kidney disease   Abdominal wall abscess   ESBL E. coli carrier   Anastomotic leak of intestine   1-Sepsis secondary to infected groin abscess, and  -Presacral space 5 x 4.5 cm fluid collection extending into left ischial anal fat slightly  increased from 06/11/2023 -Dr. Royal discussed with Dr. Stevie with general surgery on 07/10/2023--IR placed drain to be managed by Dr. Leigh COTA -ID following,= Culture growing ESBL E coli.  -Needs Imipenem/meropenem  at discharge. Trying HD unit to get approval for antibiotics. If not might need to go to infusion center or SNF for antibiotics.  Drain care: Patient will need to flush drain QD with 5 cc NS, record output  QD, dressing changes every 2-3 days or earlier if soiled.  Vascular evaluated patient. Graft does not appears infected.   ESRD Monday Wednesday and Friday via [facility] complicated by hypotension Continue with midodrine  Hd HD 1/10  SBO, Vs Ilues;  Report abdominal pain.  Avoid narcotics.  KUB ileus Vs Obstruction.  Surgery re-consulted.  KUB with sign of SBO.  Resolved. Tolerating diet.   A-fib on Eliquis  Had paroxysmal A-fib started on IV amiodarone  subsequently transition to oral, amiodarone  dose  reduced to 200 due to mild bradycardia Follow up outpatient with cardiology  Refuse heparin  gtt.  Resume Eliquis . Monitor hb.   Depression: Continue with Zoloft   Obstructive uropathy with chronic left PCN S/P exchange 2 weeks ago On IV antibiotics.  1/09:Tube leaking, and coming off , IR consulted and informed.  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 Rollin GI--not a clear anoscopy/procedural operative candidate  -  Hypotension, chronic on Midodrine .  Worsening hypotension 1/09  form increase out put form ostomy.  IV bolus given   Previous DVT Not on anticoagulation.  CAD: LHC on 07/22/22 which showed no significant CAD and normal LVEDP.     See wound care documentation below.  Pressure Injury 07/08/23 Buttocks Bilateral Stage 1 -  Intact skin with non-blanchable redness of a localized area usually over a bony prominence. redness due to bleeding / pus wound (Active)  07/08/23 1454  Location: Buttocks  Location  Orientation: Bilateral  Staging: Stage 1 -  Intact skin with non-blanchable redness of a localized area usually over a bony prominence.  Wound Description (Comments): redness due to bleeding / pus wound  Present on Admission: Yes  Dressing Type Foam - Lift dressing to assess site every  shift 07/18/23 0900     Estimated body mass index is 25.28 kg/m as calculated from the following:   Height as of this encounter: 5' 5 (1.651 m).   Weight as of this encounter: 68.9 kg.   DVT prophylaxis: Eliquis  Code Status: Full code Family Communication: daughter at bedside 1/08  Disposition Plan:  Status is: Inpatient Remains inpatient appropriate because: management of infection    Consultants:  Sx ID   Procedures:  none  Antimicrobials:    Subjective: Denies abdominal pain . Tolerating diet.   Objective: Vitals:   07/18/23 0408 07/18/23 0739 07/18/23 0833 07/18/23 1245  BP: (!) 106/53 (!) 106/46  (!) 92/58  Pulse: 62 62 70   Resp: 18 14 14 20   Temp: 97.8 F (36.6 C) (!) 97.5 F (36.4 C)    TempSrc: Oral Oral    SpO2: 96% 91%    Weight: 68.9 kg     Height:        Intake/Output Summary (Last 24 hours) at 07/18/2023 1407 Last data filed at 07/18/2023 0524 Gross per 24 hour  Intake 195 ml  Output 455 ml  Net -260 ml   Filed Weights   07/17/23 0938 07/17/23 1240 07/18/23 0408  Weight: 66.3 kg 66 kg 68.9 kg    Examination:  General exam: NAD Respiratory system: CCTA Cardiovascular system: S 1, S 2 RRR Gastrointestinal system: BS present, soft, nt,  Ostomy bag with fluid Central nervous system: Alert Extremities: Symmetric 5 x 5 power.   Data Reviewed: I have personally reviewed following labs and imaging studies  CBC: Recent Labs  Lab 07/14/23 0346 07/15/23 0358 07/16/23 0630 07/17/23 0324 07/18/23 0250  WBC 9.4 10.7* 12.6* 7.6 7.9  HGB 8.9* 8.7* 9.2* 8.6* 7.9*  HCT 26.6* 25.7* 27.7* 25.3* 23.3*  MCV 122.6* 123.0* 123.1* 122.2* 122.0*  PLT 133* 124* 100* 103* 95*   Basic Metabolic Panel: Recent Labs  Lab 07/12/23 0326 07/12/23 0927 07/14/23 0346 07/15/23 0358 07/16/23 0630 07/17/23 0324 07/18/23 0250  NA  --    < > 132* 131* 134* 134* 133*  K  --    < > 6.1* 4.6 5.6* 5.2* 4.2  CL  --    < > 89* 88* 89* 86* 90*  CO2  --     < > 27 29 29  33* 30  GLUCOSE  --    < > 96 109* 109* 95 92  BUN  --    < > 70* 42* 67* 77* 40*  CREATININE  --    < > 10.22* 6.94* 9.47* 10.16* 6.52*  CALCIUM   --    < > 9.0 8.8* 9.8 9.6 8.9  MG 1.7  --  2.0  --   --   --   --   PHOS  --    < > 8.3* 6.3* 7.8* 7.5* 4.9*   < > = values in this interval not displayed.   GFR: Estimated Creatinine Clearance: 7.2 mL/min (A) (by C-G formula based on SCr of 6.52 mg/dL (H)). Liver Function Tests: Recent Labs  Lab 07/13/23 9341 07/13/23 9340 07/14/23 0346 07/15/23 0358 07/16/23 0630 07/17/23 0324 07/18/23 0250  AST 13*  --   --   --   --   --   --  ALT 13  --   --   --   --   --   --   ALKPHOS 73  --   --   --   --   --   --   BILITOT 0.7  --   --   --   --   --   --   PROT 6.4*  --   --   --   --   --   --   ALBUMIN  2.9*   < > 2.7* 2.7* 2.9* 2.8* 2.8*   < > = values in this interval not displayed.   No results for input(s): LIPASE, AMYLASE in the last 168 hours. No results for input(s): AMMONIA in the last 168 hours. Coagulation Profile: Recent Labs  Lab 07/13/23 0658  INR 1.1   Cardiac Enzymes: No results for input(s): CKTOTAL, CKMB, CKMBINDEX, TROPONINI in the last 168 hours. BNP (last 3 results) No results for input(s): PROBNP in the last 8760 hours. HbA1C: No results for input(s): HGBA1C in the last 72 hours. CBG: Recent Labs  Lab 07/13/23 2104 07/14/23 0602  GLUCAP 114* 81   Lipid Profile: No results for input(s): CHOL, HDL, LDLCALC, TRIG, CHOLHDL, LDLDIRECT in the last 72 hours. Thyroid Function Tests: No results for input(s): TSH, T4TOTAL, FREET4, T3FREE, THYROIDAB in the last 72 hours. Anemia Panel: No results for input(s): VITAMINB12, FOLATE, FERRITIN, TIBC, IRON, RETICCTPCT in the last 72 hours. Sepsis Labs: No results for input(s): PROCALCITON, LATICACIDVEN in the last 168 hours.  Recent Results (from the past 240 hours)  Aerobic/Anaerobic Culture  w Gram Stain (surgical/deep wound)     Status: None (Preliminary result)   Collection Time: 07/13/23  1:09 PM   Specimen: Abscess  Result Value Ref Range Status   Specimen Description ABSCESS  Final   Special Requests Normal  Final   Gram Stain   Final    ABUNDANT WBC PRESENT, PREDOMINANTLY PMN RARE GRAM POSITIVE COCCI RARE GRAM NEGATIVE RODS    Culture   Final    ABUNDANT ESCHERICHIA COLI Confirmed Extended Spectrum Beta-Lactamase Producer (ESBL).  In bloodstream infections from ESBL organisms, carbapenems are preferred over piperacillin /tazobactam. They are shown to have a lower risk of mortality. NO ANAEROBES ISOLATED Sent to [facility] for further susceptibility testing. Performed at [facility], [address], [place], [place] [address]    Report Status PENDING  Incomplete   Organism ID, Bacteria ESCHERICHIA COLI  Final      Susceptibility   Escherichia coli - MIC*    AMPICILLIN >=32 RESISTANT Resistant     CEFEPIME  >=32 RESISTANT Resistant     CEFTAZIDIME RESISTANT Resistant     CEFTRIAXONE >=64 RESISTANT Resistant     CIPROFLOXACIN >=4 RESISTANT Resistant     GENTAMICIN <=1 SENSITIVE Sensitive     IMIPENEM <=0.25 SENSITIVE Sensitive     TRIMETH/SULFA >=320 RESISTANT Resistant     AMPICILLIN/SULBACTAM >=32 RESISTANT Resistant     PIP/TAZO >=128 RESISTANT Resistant ug/mL    * ABUNDANT ESCHERICHIA COLI         Radiology Studies: IR NEPHROSTOMY EXCHANGE LEFT Result Date: 07/17/2023 INDICATION: CHRONIC INDWELLING LEFT NEPHROSTOMY, ROUTINE EXCHANGE EXAM: FLUOROSCOPIC LEFT NEPHROSTOMY EXCHANGE COMPARISON:  07/15/2023 MEDICATIONS: 1% LIDOCAINE  LOCAL ANESTHESIA/SEDATION: None. CONTRAST:  10 cc-administered into the collecting system(s) FLUOROSCOPY: Radiation Exposure Index (as provided by the fluoroscopic device): 2.0 mGy Kerma COMPLICATIONS: None immediate. PROCEDURE: Informed written consent was obtained from the patient after a thorough discussion of the procedural  risks,  benefits and alternatives. All questions were addressed. Maximal Sterile Barrier Technique was utilized including caps, mask, sterile gowns, sterile gloves, sterile drape, hand hygiene and skin antiseptic. A timeout was performed prior to the initiation of the procedure. Under sterile conditions and local anesthesia, the existing retracted 8 French left nephrostomy was exchanged for a new 10 French nephrostomy over an Amplatz guidewire. Retention loop formed in the renal pelvis. Contrast injection confirms position. Images obtained for documentation. Catheter secured with a silk suture. Sterile dressing applied. Gravity drainage bag connected. IMPRESSION: Successful fluoroscopic exchange and upsize of the 10 French left nephrostomy Electronically Signed   By: CHRISTELLA.  Shick M.D.   On: 07/17/2023 08:56        Scheduled Meds:  (feeding supplement) PROSource Plus  30 mL Oral BID BM   acyclovir  ointment   Topical Q3H   amiodarone   200 mg Oral BID   apixaban   2.5 mg Oral BID   Chlorhexidine  Gluconate Cloth  6 each Topical Q0600   Chlorhexidine  Gluconate Cloth  6 each Topical Q0600   darbepoetin (ARANESP ) injection - DIALYSIS  100 mcg Subcutaneous Q Tue-1800   dorzolamide   1 drop Both Eyes BID   ferrous sulfate   325 mg Oral Q breakfast   folic acid   1 mg Oral Daily   Gerhardt's butt cream   Topical BID   latanoprost   1 drop Right Eye QHS   magnesium  oxide  400 mg Oral BID   midodrine   10 mg Oral TID WC   mometasone -formoterol   2 puff Inhalation BID   pantoprazole   40 mg Oral BID   sertraline   25 mg Oral QHS   sevelamer  carbonate  2.4 g Oral TID WC   sodium chloride  flush  5 mL Intracatheter Q8H   sodium zirconium cyclosilicate   10 g Oral Daily   timolol   1 drop Both Eyes Daily   Continuous Infusions:  meropenem  (MERREM ) IV 1 g (07/17/23 1744)   sodium chloride        LOS: 11 days    Time spent: 35 Minutes    Tamara Kenyon A Tramar Brueckner, MD [facility]   If 7PM-7AM, please  contact night-coverage [email or web address]  07/18/2023, 2:07 PM

## 2023-07-19 DIAGNOSIS — L02214 Cutaneous abscess of groin: Secondary | ICD-10-CM | POA: Diagnosis not present

## 2023-07-19 LAB — RENAL FUNCTION PANEL
Albumin: 2.8 g/dL — ABNORMAL LOW (ref 3.5–5.0)
Anion gap: 11 (ref 5–15)
BUN: 61 mg/dL — ABNORMAL HIGH (ref 8–23)
CO2: 32 mmol/L (ref 22–32)
Calcium: 9.1 mg/dL (ref 8.9–10.3)
Chloride: 91 mmol/L — ABNORMAL LOW (ref 98–111)
Creatinine, Ser: 8.16 mg/dL — ABNORMAL HIGH (ref 0.61–1.24)
GFR, Estimated: 6 mL/min — ABNORMAL LOW (ref >60)
Glucose, Bld: 114 mg/dL — ABNORMAL HIGH (ref 70–99)
Phosphorus: 4.2 mg/dL (ref 2.5–4.6)
Potassium: 4.1 mmol/L (ref 3.5–5.1)
Sodium: 134 mmol/L — ABNORMAL LOW (ref 135–145)

## 2023-07-19 LAB — CBC
HCT: 23.7 % — ABNORMAL LOW (ref 39.0–52.0)
Hemoglobin: 8 g/dL — ABNORMAL LOW (ref 13.0–17.0)
MCH: 41.5 pg — ABNORMAL HIGH (ref 26.0–34.0)
MCHC: 33.8 g/dL (ref 30.0–36.0)
MCV: 122.8 fL — ABNORMAL HIGH (ref 80.0–100.0)
Platelets: 94 10*3/uL — ABNORMAL LOW (ref 150–400)
RBC: 1.93 MIL/uL — ABNORMAL LOW (ref 4.22–5.81)
RDW: 14.3 % (ref 11.5–15.5)
WBC: 7.4 10*3/uL (ref 4.0–10.5)
nRBC: 0 % (ref 0.0–0.2)

## 2023-07-19 LAB — GLUCOSE, CAPILLARY: Glucose-Capillary: 96 mg/dL (ref 70–99)

## 2023-07-19 MED ORDER — ALBUMIN HUMAN 25 % IV SOLN
50.0000 g | INTRAVENOUS | Status: AC
  Administered 2023-07-19: 50 g via INTRAVENOUS
  Filled 2023-07-19: qty 200

## 2023-07-19 MED ORDER — CHLORHEXIDINE GLUCONATE CLOTH 2 % EX PADS
6.0000 | MEDICATED_PAD | Freq: Every day | CUTANEOUS | Status: DC
  Administered 2023-07-20 – 2023-07-21 (×2): 6 via TOPICAL

## 2023-07-19 MED ORDER — AMIODARONE HCL 200 MG PO TABS
200.0000 mg | ORAL_TABLET | Freq: Every day | ORAL | Status: DC
  Administered 2023-07-19: 200 mg via ORAL
  Filled 2023-07-19: qty 1

## 2023-07-19 MED ORDER — VALACYCLOVIR HCL 500 MG PO TABS
500.0000 mg | ORAL_TABLET | Freq: Once | ORAL | Status: AC
  Administered 2023-07-19: 500 mg via ORAL
  Filled 2023-07-19: qty 1

## 2023-07-19 NOTE — Progress Notes (Signed)
 [facility] Progress Note   Subjective:   Patient seen and examined at bedside.  Reports tolerating powder binder much better.  Otherwise no complaints.  Denies dizziness, CP, SOB, abdominal pain and n/v/d.   Objective Vitals:   07/18/23 2318 07/19/23 0239 07/19/23 0848 07/19/23 1339  BP: (!) 109/58 122/65 (!) 97/51 (!) 94/56  Pulse: 74 66  (!) 58  Resp: 17 20 20 15   Temp: 97.6 F (36.4 C) (!) 97.5 F (36.4 C) (!) 97.5 F (36.4 C) 97.7 F (36.5 C)  TempSrc: Oral Oral Oral Oral  SpO2: 91% 94%  92%  Weight:      Height:       Physical Exam General:chronically ill appearing male in NAD Heart:RRR, no mrg Lungs:CTAB, nml WOB on RA Abdomen:soft, NTND Extremities:no LE edema Dialysis Access: L AVG +b/t   Filed Weights   07/17/23 0938 07/17/23 1240 07/18/23 0408  Weight: 66.3 kg 66 kg 68.9 kg    Intake/Output Summary (Last 24 hours) at 07/19/2023 1342 Last data filed at 07/19/2023 0239 Gross per 24 hour  Intake --  Output 115 ml  Net -115 ml    Additional Objective Labs: Basic Metabolic Panel: Recent Labs  Lab 07/17/23 0324 07/18/23 0250 07/19/23 0314  NA 134* 133* 134*  K 5.2* 4.2 4.1  CL 86* 90* 91*  CO2 33* 30 32  GLUCOSE 95 92 114*  BUN 77* 40* 61*  CREATININE 10.16* 6.52* 8.16*  CALCIUM  9.6 8.9 9.1  PHOS 7.5* 4.9* 4.2   Liver Function Tests: Recent Labs  Lab 07/13/23 0658 07/13/23 0659 07/17/23 0324 07/18/23 0250 07/19/23 0314  AST 13*  --   --   --   --   ALT 13  --   --   --   --   ALKPHOS 73  --   --   --   --   BILITOT 0.7  --   --   --   --   PROT 6.4*  --   --   --   --   ALBUMIN  2.9*   < > 2.8* 2.8* 2.8*   < > = values in this interval not displayed.   CBC: Recent Labs  Lab 07/15/23 0358 07/16/23 0630 07/17/23 0324 07/18/23 0250 07/19/23 0314  WBC 10.7* 12.6* 7.6 7.9 7.4  HGB 8.7* 9.2* 8.6* 7.9* 8.0*  HCT 25.7* 27.7* 25.3* 23.3* 23.7*  MCV 123.0* 123.1* 122.2* 122.0* 122.8*  PLT 124* 100* 103* 95* 94*   Blood  Culture    Component Value Date/Time   SDES ABSCESS 07/13/2023 1309   SPECREQUEST Normal 07/13/2023 1309   CULT  07/13/2023 1309    ABUNDANT ESCHERICHIA COLI Confirmed Extended Spectrum Beta-Lactamase Producer (ESBL).  In bloodstream infections from ESBL organisms, carbapenems are preferred over piperacillin /tazobactam. They are shown to have a lower risk of mortality. NO ANAEROBES ISOLATED Sent to [facility] for further susceptibility testing. Performed at [facility], [address], [place], [place] [address]    REPTSTATUS PENDING 07/13/2023 1309   CBG: Recent Labs  Lab 07/13/23 2104 07/14/23 0602  GLUCAP 114* 81   Lab Results  Component Value Date   INR 1.1 07/13/2023   INR 1.3 (H) 06/02/2022   Studies/Results: No results found.  Medications:  meropenem  (MERREM ) IV 1 g (07/18/23 1750)   sodium chloride       (feeding supplement) PROSource Plus  30 mL Oral BID BM   acyclovir  ointment   Topical Q3H   amiodarone   200 mg Oral Daily   apixaban   2.5 mg Oral BID   Chlorhexidine  Gluconate Cloth  6 each Topical Q0600   darbepoetin (ARANESP ) injection - DIALYSIS  100 mcg Subcutaneous Q Tue-1800   dorzolamide   1 drop Both Eyes BID   ferrous sulfate   325 mg Oral Q breakfast   fluconazole   100 mg Oral Daily   folic acid   1 mg Oral Daily   Gerhardt's butt cream   Topical BID   latanoprost   1 drop Right Eye QHS   magnesium  oxide  400 mg Oral BID   midodrine   10 mg Oral TID WC   mometasone -formoterol   2 puff Inhalation BID   nystatin    Topical TID   pantoprazole   40 mg Oral BID   sertraline   25 mg Oral QHS   sevelamer  carbonate  2.4 g Oral TID WC   sodium chloride  flush  5 mL Intracatheter Q8H   sodium zirconium cyclosilicate   10 g Oral Daily   timolol   1 drop Both Eyes Daily    Dialysis Orders: MWF - [facility] 2:45hr, 350/A1.5, EDW 68.5kg, 2K/2.5Ca, AVG, heparin  5000 unit bolus - Mircera 120mcg IV q 4 weeks (last 12/18) - Calcitriol  0.4mcg PO q HD    Assessment/Plan: Sepsis/groin abscess: S/p I&D 12/31, Blood Cx negative. Finished course of linezolid .  Presacral wound/fluid collection: Drain placed by IR 1/6, Cx grew ESBL E. Coli. On Meropenem . Pharmacy asked us  to see if Ertapenem  could be ordered by his HD unit and given there. [facility] center requested to order 1/9, no response yet. Reaching out to other center tomorrow who has ordered this type of antibiotic before for guidance. Per ID if unable to obtain at HD will need to have peripheral IV to get antibiotic dosed either at SNF or IV infusion clinic post HD.  ESRD: Usual MWF schedule. Next HD on 07/19/22. Per [facility] AVG does not appear infected.  HypoTN/volume: BP low/stable. On mido 10mg  TID.  Does not appear volume overloaded on exam.   Anemia of ESRD: Hgb 8.0, Aranesp  given 1/7 - continue q Tuesday during admit.  Secondary HPTH: CorrCa high, VDRA remains on hold. Phos high - Changed renvela  to powder due to patient having difficultly swallowing large pill, feeling like it gets stuck in his chest.  Tolerating powder much better. Nutrition: Alb low, continue supplements. A-fib: Hx RVR, better. On amiodarone  + Eliquis .  Manuelita Labella, PA-C [facility] 07/19/2023,1:42 PM  LOS: 12 days

## 2023-07-19 NOTE — Progress Notes (Signed)
 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  He reported boil  started 3 weeks prior to admission, saw PCP who prescribed doxycycline  for 10 days.  Antibiotics did not help.  Patient admitted with groin abscess and  abscess in presacral space. Develops ileus vs SBO. Surgery re-consulted.    Assessment & Plan:   Principal Problem:   Abscess of abdominal cavity (HCC) Active Problems:   Severe sepsis (HCC)   End-stage renal disease on hemodialysis (HCC)   Depression   Abscess of groin, left   Leukocytosis   Paroxysmal atrial fibrillation (HCC)   History of COPD   Chronic diastolic CHF (congestive heart failure) (HCC)   History of anemia due to chronic kidney disease   Abdominal wall abscess   ESBL E. coli carrier   Anastomotic leak of intestine   1-Sepsis secondary to infected groin abscess, and  -Presacral space 5 x 4.5 cm fluid collection extending into left ischial anal fat slightly  increased from 06/11/2023 -Dr. Royal discussed with Dr. Stevie with general surgery on 07/10/2023--IR placed drain to be managed by Dr. Leigh COTA -ID following,= Culture growing ESBL E coli.  -Needs Imipenem/meropenem  at discharge. Trying HD unit to get approval for antibiotics. If not might need to go to infusion center or SNF for antibiotics.  -Drain care: Patient will need to flush drain QD with 5 cc NS, record output  QD, dressing changes every 2-3 days or earlier if soiled.  Vascular evaluated patient. Graft does not appears infected.  Awaiting to be able to arrange Antibiotics, IV for discharge, he needs Meropenem .   ESRD Monday Wednesday and Friday via [facility] complicated by hypotension Continue with midodrine  Hd HD 1/10  SBO, Vs Ilues;  Avoid narcotics.  KUB ileus Vs Obstruction.  Surgery re-consulted.  KUB with sign of SBO.  Resolved. Tolerating diet.   A-fib on Eliquis  Had paroxysmal A-fib started on IV amiodarone  subsequently transition to oral, amiodarone  dose  reduced to 200 due to mild bradycardia Follow up outpatient with cardiology  Refuse heparin  gtt.  Resume Eliquis . Monitor hb. Stable.   Depression: Continue with Zoloft   Obstructive uropathy with chronic left PCN S/P exchange 2 weeks ago On IV antibiotics.  1/09:Tube leaking, and coming off , IR consulted and informed.  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 Rollin GI--not a clear anoscopy/procedural operative candidate  -  Hypotension, chronic on Midodrine .  Worsening hypotension 1/09  form increase out put form ostomy.  IV bolus given   Previous DVT Not on anticoagulation.  CAD: LHC on 07/22/22 which showed no significant CAD and normal LVEDP.  Yeast pubic; started Nystatin  Powder.   Herpes labile.  Will give one time dose Valtrex .   See wound care documentation below.  Pressure Injury 07/08/23 Buttocks Bilateral Stage 1 -  Intact skin with non-blanchable  redness of a localized area usually over a bony prominence. redness due to bleeding / pus wound (Active)  07/08/23 1454  Location: Buttocks  Location Orientation: Bilateral  Staging: Stage 1 -  Intact skin with non-blanchable redness of a localized area usually over a bony  prominence.  Wound Description (Comments): redness due to bleeding / pus wound  Present on Admission: Yes  Dressing Type Foam - Lift dressing to assess site every shift 07/19/23 1030     Estimated body mass index is 25.28 kg/m as calculated from the following:   Height as of this encounter: 5' 5 (1.651 m).   Weight as of this encounter: 68.9 kg.   DVT prophylaxis: Eliquis  Code Status: Full code Family Communication: daughter at bedside 1/11 Disposition Plan:  Status is: Inpatient Remains inpatient appropriate because: management of infection    Consultants:  Sx ID   Procedures:  none  Antimicrobials:    Subjective: He relates blister in his lips same, feels getting worse.  Tolerating diet.   Objective: Vitals:   07/18/23 2318 07/19/23 0239 07/19/23 0848 07/19/23 1339  BP: (!) 109/58 122/65 (!) 97/51 (!) 94/56  Pulse: 74 66  (!) 58  Resp: 17 20 20 15   Temp: 97.6 F (36.4 C) (!) 97.5 F (36.4 C) (!) 97.5 F (36.4 C) 97.7 F (36.5 C)  TempSrc: Oral Oral Oral Oral  SpO2: 91% 94%  92%  Weight:      Height:        Intake/Output Summary (Last 24 hours) at 07/19/2023 1537 Last data filed at 07/19/2023 0239 Gross per 24 hour  Intake --  Output 115 ml  Net -115 ml   Filed Weights   07/17/23 9061 07/17/23 1240 07/18/23 0408  Weight: 66.3 kg 66 kg 68.9 kg    Examination:  General exam: NAD Respiratory system:  CTA Cardiovascular system: S 1, S 2 RRR Gastrointestinal system: BS present, soft, nt  Ostomy bag with fluid Central nervous system: Alert Extremities: Symmetric 5 x 5 power.   Data Reviewed: I have personally reviewed following labs and imaging studies  CBC: Recent Labs  Lab 07/15/23 0358 07/16/23 0630 07/17/23 0324 07/18/23 0250 07/19/23 0314  WBC 10.7* 12.6* 7.6 7.9 7.4  HGB 8.7* 9.2* 8.6* 7.9* 8.0*  HCT 25.7* 27.7* 25.3* 23.3* 23.7*  MCV 123.0* 123.1* 122.2* 122.0* 122.8*  PLT 124* 100* 103* 95* 94*   Basic Metabolic  Panel: Recent Labs  Lab 07/14/23 0346 07/15/23 0358 07/16/23 0630 07/17/23 0324 07/18/23 0250 07/19/23 0314  NA 132* 131* 134* 134* 133* 134*  K 6.1* 4.6 5.6* 5.2* 4.2 4.1  CL 89* 88* 89* 86* 90* 91*  CO2 27 29 29  33* 30 32  GLUCOSE 96 109* 109* 95 92 114*  BUN 70* 42* 67* 77* 40* 61*  CREATININE 10.22* 6.94* 9.47* 10.16* 6.52* 8.16*  CALCIUM  9.0 8.8* 9.8 9.6 8.9 9.1  MG 2.0  --   --   --   --   --   PHOS 8.3* 6.3* 7.8* 7.5* 4.9* 4.2   GFR: Estimated Creatinine Clearance: 5.8 mL/min (A) (by C-G formula based on SCr of 8.16 mg/dL (H)). Liver Function Tests: Recent Labs  Lab 07/13/23 9341 07/13/23 9340 07/15/23 0358 07/16/23 0630 07/17/23 0324 07/18/23 0250 07/19/23 0314  AST 13*  --   --   --   --   --   --   ALT 13  --   --   --   --   --   --  ALKPHOS 73  --   --   --   --   --   --   BILITOT 0.7  --   --   --   --   --   --   PROT 6.4*  --   --   --   --   --   --   ALBUMIN  2.9*   < > 2.7* 2.9* 2.8* 2.8* 2.8*   < > = values in this interval not displayed.   No results for input(s): LIPASE, AMYLASE in the last 168 hours. No results for input(s): AMMONIA in the last 168 hours. Coagulation Profile: Recent Labs  Lab 07/13/23 0658  INR 1.1   Cardiac Enzymes: No results for input(s): CKTOTAL, CKMB, CKMBINDEX, TROPONINI in the last 168 hours. BNP (last 3 results) No results for input(s): PROBNP in the last 8760 hours. HbA1C: No results for input(s): HGBA1C in the last 72 hours. CBG: Recent Labs  Lab 07/13/23 2104 07/14/23 0602  GLUCAP 114* 81   Lipid Profile: No results for input(s): CHOL, HDL, LDLCALC, TRIG, CHOLHDL, LDLDIRECT in the last 72 hours. Thyroid Function Tests: No results for input(s): TSH, T4TOTAL, FREET4, T3FREE, THYROIDAB in the last 72 hours. Anemia Panel: No results for input(s): VITAMINB12, FOLATE, FERRITIN, TIBC, IRON, RETICCTPCT in the last 72 hours. Sepsis Labs: No results for  input(s): PROCALCITON, LATICACIDVEN in the last 168 hours.  Recent Results (from the past 240 hours)  Aerobic/Anaerobic Culture w Gram Stain (surgical/deep wound)     Status: None (Preliminary result)   Collection Time: 07/13/23  1:09 PM   Specimen: Abscess  Result Value Ref Range Status   Specimen Description ABSCESS  Final   Special Requests Normal  Final   Gram Stain   Final    ABUNDANT WBC PRESENT, PREDOMINANTLY PMN RARE GRAM POSITIVE COCCI RARE GRAM NEGATIVE RODS    Culture   Final    ABUNDANT ESCHERICHIA COLI Confirmed Extended Spectrum Beta-Lactamase Producer (ESBL).  In bloodstream infections from ESBL organisms, carbapenems are preferred over piperacillin /tazobactam. They are shown to have a lower risk of mortality. NO ANAEROBES ISOLATED Sent to [facility] for further susceptibility testing. Performed at [facility], [address], [place], [place] [address]    Report Status PENDING  Incomplete   Organism ID, Bacteria ESCHERICHIA COLI  Final      Susceptibility   Escherichia coli - MIC*    AMPICILLIN >=32 RESISTANT Resistant     CEFEPIME  >=32 RESISTANT Resistant     CEFTAZIDIME RESISTANT Resistant     CEFTRIAXONE >=64 RESISTANT Resistant     CIPROFLOXACIN >=4 RESISTANT Resistant     GENTAMICIN <=1 SENSITIVE Sensitive     IMIPENEM <=0.25 SENSITIVE Sensitive     TRIMETH/SULFA >=320 RESISTANT Resistant     AMPICILLIN/SULBACTAM >=32 RESISTANT Resistant     PIP/TAZO >=128 RESISTANT Resistant ug/mL    * ABUNDANT ESCHERICHIA COLI         Radiology Studies: No results found.       Scheduled Meds:  (feeding supplement) PROSource Plus  30 mL Oral BID BM   acyclovir  ointment   Topical Q3H   amiodarone   200 mg Oral Daily   apixaban   2.5 mg Oral BID   [START ON 07/20/2023] Chlorhexidine  Gluconate Cloth  6 each Topical Q0600   darbepoetin (ARANESP ) injection - DIALYSIS  100 mcg Subcutaneous Q Tue-1800   dorzolamide   1 drop Both Eyes BID   ferrous sulfate    325 mg Oral Q  breakfast   fluconazole   100 mg Oral Daily   folic acid   1 mg Oral Daily   Gerhardt's butt cream   Topical BID   latanoprost   1 drop Right Eye QHS   magnesium  oxide  400 mg Oral BID   midodrine   10 mg Oral TID WC   mometasone -formoterol   2 puff Inhalation BID   nystatin    Topical TID   pantoprazole   40 mg Oral BID   sertraline   25 mg Oral QHS   sevelamer  carbonate  2.4 g Oral TID WC   sodium chloride  flush  5 mL Intracatheter Q8H   sodium zirconium cyclosilicate   10 g Oral Daily   timolol   1 drop Both Eyes Daily   Continuous Infusions:  meropenem  (MERREM ) IV 1 g (07/18/23 1750)   sodium chloride        LOS: 12 days    Time spent: 35 Minutes    Joshua Vigen A Sanjana Folz, MD [facility]   If 7PM-7AM, please contact night-coverage [email or web address]  07/19/2023, 3:37 PM

## 2023-07-20 ENCOUNTER — Encounter (HOSPITAL_COMMUNITY): Payer: Self-pay | Admitting: Internal Medicine

## 2023-07-20 ENCOUNTER — Other Ambulatory Visit: Payer: Self-pay | Admitting: Student

## 2023-07-20 DIAGNOSIS — K611 Rectal abscess: Secondary | ICD-10-CM

## 2023-07-20 DIAGNOSIS — K651 Peritoneal abscess: Secondary | ICD-10-CM

## 2023-07-20 DIAGNOSIS — L02214 Cutaneous abscess of groin: Secondary | ICD-10-CM | POA: Diagnosis not present

## 2023-07-20 LAB — RENAL FUNCTION PANEL
Albumin: 3.5 g/dL (ref 3.5–5.0)
Anion gap: 13 (ref 5–15)
BUN: 80 mg/dL — ABNORMAL HIGH (ref 8–23)
CO2: 29 mmol/L (ref 22–32)
Calcium: 9.1 mg/dL (ref 8.9–10.3)
Chloride: 91 mmol/L — ABNORMAL LOW (ref 98–111)
Creatinine, Ser: 10.34 mg/dL — ABNORMAL HIGH (ref 0.61–1.24)
GFR, Estimated: 4 mL/min — ABNORMAL LOW (ref >60)
Glucose, Bld: 104 mg/dL — ABNORMAL HIGH (ref 70–99)
Phosphorus: 3.9 mg/dL (ref 2.5–4.6)
Potassium: 4.1 mmol/L (ref 3.5–5.1)
Sodium: 133 mmol/L — ABNORMAL LOW (ref 135–145)

## 2023-07-20 LAB — CBC
HCT: 19.8 % — ABNORMAL LOW (ref 39.0–52.0)
HCT: 19.7 % — ABNORMAL LOW (ref 39.0–52.0)
Hemoglobin: 6.5 g/dL — CL (ref 13.0–17.0)
Hemoglobin: 6.6 g/dL — CL (ref 13.0–17.0)
MCH: 40.6 pg — ABNORMAL HIGH (ref 26.0–34.0)
MCH: 41 pg — ABNORMAL HIGH (ref 26.0–34.0)
MCHC: 32.8 g/dL (ref 30.0–36.0)
MCHC: 33.5 g/dL (ref 30.0–36.0)
MCV: 123.8 fL — ABNORMAL HIGH (ref 80.0–100.0)
MCV: 122.4 fL — ABNORMAL HIGH (ref 80.0–100.0)
Platelets: 83 10*3/uL — ABNORMAL LOW (ref 150–400)
Platelets: 91 10*3/uL — ABNORMAL LOW (ref 150–400)
RBC: 1.6 MIL/uL — ABNORMAL LOW (ref 4.22–5.81)
RBC: 1.61 MIL/uL — ABNORMAL LOW (ref 4.22–5.81)
RDW: 14.3 % (ref 11.5–15.5)
RDW: 14.3 % (ref 11.5–15.5)
WBC: 7.5 10*3/uL (ref 4.0–10.5)
WBC: 8.7 10*3/uL (ref 4.0–10.5)
nRBC: 0 % (ref 0.0–0.2)
nRBC: 0 % (ref 0.0–0.2)

## 2023-07-20 LAB — PREPARE RBC (CROSSMATCH)

## 2023-07-20 LAB — ABO/RH: ABO/RH(D): B NEG

## 2023-07-20 MED ORDER — SODIUM CHLORIDE 0.9% IV SOLUTION: Freq: Once | INTRAVENOUS | Status: DC

## 2023-07-20 NOTE — Consult Note (Addendum)
 Cardiology Consultation   Patient ID: Joshua Riley MRN: [id number]; DOB: 10/22/1935  Admit date: 07/07/2023 Date of Consult: 07/20/2023  PCP:  Katrinka Garnette KIDD, MD   [facility] Providers Cardiologist:  Lynwood Schilling, MD (previously Dr. Dann)  Patient Profile:   Joshua Riley is a 88 y.o. male with a hx of ESRD MWF chronic hypotension on midodrine , dialysis through left thigh AV graft, PAF, UC s/p proctocolectomy with ileostomy in 2011 complicated by ureteral injury now with chronic left nephrostomy tube, hx of DVT on eliquis  who is being seen 07/20/2023 for the evaluation of Afib RVR and sinus pauses at the request of Dr. Madelyne.  History of Present Illness:   Mr. Leinbach has a history of persistent Afib on eliquis . Nuclear stress test 11/2020 negative for ischemia. Echo with normal LVEF. When seen in clinic 05/2022 he reported worsening SOB with minimal exertion and was scheduled for heart catheterization. Unfortunately he was admitted prior to this for sepsis felt related to his right groin TDC. LHC 07/22/22 showed no significant CAD and normal LVEDP. Heart monitor showed runs of SVT but no recurrent Afib.   He has a history of prior proctocolectomy and ileostomy and obstructive uropathy with chronic left nephrostomy.  Last seen in clinic 02/2023. Not on AV nodal agents. Appropriately dosed on eliquis  2.5 mg BID. Pt apparently reduced eliquis  to once per day due to BRBPR - was being evaluated by [facility].   He has bilateral SVC occlusions and hx of right femoral tunneled HD catheter complicated by infection and now with left groin fistula now complicated with abscess.   He presented to [facility] with left groin abscess, rectal bleeding, perirectal abscess, and SOB. He saw general surgery for left groin abscess and underwent I&D and packing  on 07/07/23 and recommended for IV ABX. He was admitted 07/07/23 with severe sepsis secondary to groin abscess.   Pt had bradycardia  and pauses 2.11 sec on telemetry. Unfortunately he has also had a drop in Hb to 6.5 --> 6.6 from baseline 8-9. Hx of anemia of chronic disease. Eliquis  has been held. Cardiology was consulted.   Pt denies pre-syncope or dizziness. He has baseline SOB, but not currently worse. He denies bleeding.    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     Ileostomy in place [facility])    Obstructive uropathy    With chronic left nephrostomy   Old retinal detachment, total or subtotal    Orthostatic hypotension 10/22/2021   Sleep apnea    does not need a machine   Stroke (HCC)    Ulcerative colitis (HCC)    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    Past Surgical History:  Procedure Laterality Date   BASAL CELL CARCINOMA EXCISION     10/23   COLON SURGERY     creation of j pouch     and subsequent takedown of j pouch   EYE SURGERY     IR NEPHROSTOMY EXCHANGE LEFT  12/10/2021  IR NEPHROSTOMY EXCHANGE LEFT  04/22/2022   IR NEPHROSTOMY EXCHANGE LEFT  07/29/2022   IR NEPHROSTOMY EXCHANGE LEFT  10/28/2022   IR NEPHROSTOMY EXCHANGE LEFT  02/05/2023   IR NEPHROSTOMY EXCHANGE LEFT  05/07/2023   IR NEPHROSTOMY EXCHANGE LEFT  06/25/2023   IR NEPHROSTOMY EXCHANGE LEFT  07/17/2023   LEFT HEART CATH AND CORONARY ANGIOGRAPHY N/A 07/22/2022   Procedure: LEFT HEART CATH AND CORONARY ANGIOGRAPHY;  Surgeon: Jordan, Peter M, MD;  Location: [facility] INVASIVE CV LAB;  Service: Cardiovascular;  Laterality: N/A;   REVISION OF ARTERIOVENOUS GORETEX GRAFT Left 05/06/2022   Procedure: REDO LEFT THIGH ARTERIOVENOUS 4-7 MM GORETEX GRAFT;  Surgeon: Eliza Lonni RAMAN, MD;   Location: [facility] OR;  Service: Vascular;  Laterality: Left;   SMALL INTESTINE SURGERY     TOTAL COLECTOMY       Home Medications:  Prior to Admission medications   Medication Sig Start Date End Date Taking? Authorizing Provider  acetaminophen  (TYLENOL ) 500 MG tablet Take 1,000 mg by mouth as needed for mild pain (pain score 1-3).   Yes [provider]  albuterol  (PROVENTIL ) (2.5 MG/3ML) 0.083% nebulizer solution Take 2.5 mg by nebulization 2 (two) times daily. 06/17/23  Yes [provider]  BIOTIN PO Take 1 capsule by mouth daily.   Yes [provider]  budesonide  (PULMICORT ) 0.5 MG/2ML nebulizer solution TAKE 2 ML (0.5 MG TOTAL) BY NEBULIZATION TWICE A DAY 06/29/23  Yes Hope Almarie ORN, NP  Cyanocobalamin  (B-12) 1000 MCG SUBL Place 1 tablet under the tongue daily at 6 (six) AM. 04/08/23  Yes Jesus Bernardino MATSU, MD  dorzolamide  (TRUSOPT ) 2 % ophthalmic solution Place 1 drop into both eyes 2 (two) times daily. 06/17/23  Yes [provider]  ELIQUIS  2.5 MG TABS tablet Take 1 tablet (2.5 mg total) by mouth 2 (two) times daily. 08/07/22  Yes Hobart Powell BRAVO, MD  latanoprost  (XALATAN ) 0.005 % ophthalmic solution Place 1 drop into the right eye at bedtime.   Yes [provider]  midodrine  (PROAMATINE ) 10 MG tablet Take 1 tablet (10 mg total) by mouth 3 (three) times daily. Takes 5 mg plus 10mg  for 15 mg total three times a day- started by nephrology in [place]  08/07/22  Yes Pemberton, Powell BRAVO, MD  midodrine  (PROAMATINE ) 5 MG tablet TAKE 1 TABLET 3 TIMES DAILY WITH MEALS. TAKES 5 MG PLUS 10MG  FOR 15 MG TOTAL THREE TIMES A DAY- STARTED BY NEPHROLOGY IN [place]  10/20/22  Yes Katrinka Garnette KIDD, MD  Omega-3 Fatty Acids (OMEGA-3 FISH OIL) 500 MG CAPS Take 1 capsule by mouth daily.   Yes [provider]  riboflavin (VITAMIN B-2) 100 MG TABS tablet Take 100 mg by mouth daily.   Yes [provider]  sertraline  (ZOLOFT ) 25 MG tablet Take 1 tablet (25  mg total) by mouth daily. Patient taking differently: Take 25 mg by mouth at bedtime. 04/08/23  Yes Jesus Bernardino MATSU, MD  sevelamer  carbonate (RENVELA ) 800 MG tablet Take 2,400 mg by mouth 3 (three) times daily with meals.   Yes [provider]  timolol  (TIMOPTIC ) 0.5 % ophthalmic solution Place 1 drop into both eyes daily.   Yes [provider]  traMADol  (ULTRAM ) 50 MG tablet Take 1 tablet (50 mg total) by mouth every 8 (eight) hours as needed (prn pain). 06/26/23  Yes Hudnell, Corean, NP  WIXELA INHUB 250-50 MCG/ACT AEPB Inhale 1 puff into the lungs 2 (two) times daily. 07/04/23  Yes [provider]  Albuterol  Sulfate 2.5  MG/0.5ML NEBU Inhale 0.5 mLs (2.5 mg total) into the lungs every 6 (six) hours. 07/15/23   Hope Almarie ORN, NP  ramelteon  (ROZEREM ) 8 MG tablet TAKE 1 TABLET BY MOUTH AT BEDTIME. 07/09/23   Katrinka Garnette KIDD, MD    Inpatient Medications: Scheduled Meds:  (feeding supplement) PROSource Plus  30 mL Oral BID BM   sodium chloride    Intravenous Once   sodium chloride    Intravenous Once   acyclovir  ointment   Topical Q3H   Chlorhexidine  Gluconate Cloth  6 each Topical Q0600   darbepoetin (ARANESP ) injection - DIALYSIS  100 mcg Subcutaneous Q Tue-1800   dorzolamide   1 drop Both Eyes BID   ferrous sulfate   325 mg Oral Q breakfast   fluconazole   100 mg Oral Daily   folic acid   1 mg Oral Daily   Gerhardt's butt cream   Topical BID   latanoprost   1 drop Right Eye QHS   magnesium  oxide  400 mg Oral BID   midodrine   10 mg Oral TID WC   mometasone -formoterol   2 puff Inhalation BID   nystatin    Topical TID   pantoprazole   40 mg Oral BID   sertraline   25 mg Oral QHS   sevelamer  carbonate  2.4 g Oral TID WC   sodium chloride  flush  5 mL Intracatheter Q8H   sodium zirconium cyclosilicate   10 g Oral Daily   timolol   1 drop Both Eyes Daily   Continuous Infusions:  meropenem  (MERREM ) IV Stopped (07/19/23 1808)   sodium chloride      PRN  Meds: acetaminophen  **OR** acetaminophen , albuterol , alum & mag hydroxide-simeth, melatonin, ondansetron  (ZOFRAN ) IV, mouth rinse, oxyCODONE -acetaminophen , traMADol   Allergies:    Allergies  Allergen Reactions   Cephalosporins Rash   Ciprofloxacin Itching and Rash   Baclofen Other (See Comments)    Altered mental status, after accidental overdose      Social History:   Social History   Socioeconomic History   Marital status: Widowed    Spouse name: Not on file   Number of children: Not on file   Years of education: Not on file   Highest education level: Not on file  Occupational History   Not on file  Tobacco Use   Smoking status: Former    Current packs/day: 0.00    Average packs/day: 2.0 packs/day for 6.0 years (12.0 ttl pk-yrs)    Types: Cigarettes    Start date: 49    Quit date: 2    Years since quitting: 40.0    Passive exposure: Never   Smokeless tobacco: Never  Vaping Use   Vaping status: Never Used  Substance and Sexual Activity   Alcohol use: Yes    Alcohol/week: 5.0 standard drinks of alcohol    Types: 5 Shots of liquor per week    Comment: socially   Drug use: Never   Sexual activity: Not Currently  Other Topics Concern   Not on file  Social History Narrative   Widowed- lost wife of 13 years to pancreatic cancer- previously married 43 years. Son in [place] and daughter helping in [place]. 4 grandkids.    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       Hobbies: dinner with family- occasional martini   Social Drivers of Health   Financial Resource Strain: Low Risk  (05/27/2022)   Overall Financial Resource Strain (CARDIA)    Difficulty of Paying Living  Expenses: Not hard at all  Food Insecurity: No Food Insecurity (07/08/2023)   Hunger Vital Sign    Worried About Running Out of Food in the Last Year: Never true    Ran Out of Food in the Last Year: Never true  Transportation Needs: No Transportation  Needs (07/08/2023)   PRAPARE - Administrator, Civil Service (Medical): No    Lack of Transportation (Non-Medical): No  Physical Activity: Inactive (05/27/2022)   Exercise Vital Sign    Days of Exercise per Week: 0 days    Minutes of Exercise per Session: 0 min  Stress: No Stress Concern Present (05/27/2022)   [employer] of Occupational Health - Occupational Stress Questionnaire    Feeling of Stress : Not at all  Social Connections: Moderately Integrated (07/08/2023)   Social Connection and Isolation Panel [NHANES]    Frequency of Communication with Friends and Family: More than three times a week    Frequency of Social Gatherings with Friends and Family: Once a week    Attends Religious Services: More than 4 times per year    Active Member of [employer] or Organizations: Yes    Attends Banker Meetings: More than 4 times per year    Marital Status: Widowed  Intimate Partner Violence: Not At Risk (07/10/2023)   Humiliation, Afraid, Rape, and Kick questionnaire    Fear of Current or Ex-Partner: No    Emotionally Abused: No    Physically Abused: No    Sexually Abused: No    Family History:    Family History  Problem Relation Age of Onset   Stroke Mother    Cancer Father    Esophageal cancer Brother      ROS:  Please see the history of present illness.   All other ROS reviewed and negative.     Physical Exam/Data:   Vitals:   07/20/23 0133 07/20/23 0408 07/20/23 0816 07/20/23 1240  BP:  (!) 102/51  (!) 102/52  Pulse:  60    Resp:  19    Temp:  (!) 97.4 F (36.3 C)  (!) 97.4 F (36.3 C)  TempSrc:  Oral  Axillary  SpO2:  99% 97%   Weight: 70.8 kg     Height:        Intake/Output Summary (Last 24 hours) at 07/20/2023 1416 Last data filed at 07/20/2023 1255 Gross per 24 hour  Intake 818.65 ml  Output 600 ml  Net 218.65 ml      07/20/2023    1:33 AM 07/18/2023    4:08 AM 07/17/2023   12:40 PM  Last 3 Weights  Weight (lbs) 156 lb 1.4 oz 151  lb 14.4 oz 145 lb 8.1 oz  Weight (kg) 70.8 kg 68.9 kg 66 kg     Body mass index is 25.97 kg/m.  General:  elderly male in NAD HEENT: normal Neck: no JVD Vascular: No carotid bruits; Distal pulses 2+ bilaterally Cardiac:  normal S1, S2; RRR; no murmur  Lungs:  clear to auscultation bilaterally, no wheezing, rhonchi or rales  Abd: soft, nontender, no hepatomegaly  Ext: no edema Musculoskeletal:  No deformities, BUE and BLE strength normal and equal Skin: warm and dry  Neuro:  CNs 2-12 intact, no focal abnormalities noted Psych:  Normal affect   EKG:  The EKG was personally reviewed and demonstrates:  sinus bradycardia with HR 55, RBBB, first degree HB  Telemetry:  Telemetry was personally reviewed and demonstrates:  Afib with RVR 07/17/23 -->  now SB with intermittent 2.1 sec pauses  Relevant CV Studies:  LHC 07/22/22   The left ventricular systolic function is normal.   LV end diastolic pressure is normal.   The left ventricular ejection fraction is 55-65% by visual estimate.   No significant CAD Normal LV function Normal LVEDP   Plan: medical management  Laboratory Data:  High Sensitivity Troponin:  No results for input(s): TROPONINIHS in the last 720 hours.   Chemistry Recent Labs  Lab 07/14/23 0346 07/15/23 0358 07/18/23 0250 07/19/23 0314 07/20/23 0327  NA 132*   < > 133* 134* 133*  K 6.1*   < > 4.2 4.1 4.1  CL 89*   < > 90* 91* 91*  CO2 27   < > 30 32 29  GLUCOSE 96   < > 92 114* 104*  BUN 70*   < > 40* 61* 80*  CREATININE 10.22*   < > 6.52* 8.16* 10.34*  CALCIUM  9.0   < > 8.9 9.1 9.1  MG 2.0  --   --   --   --   GFRNONAA 5*   < > 8* 6* 4*  ANIONGAP 16*   < > 13 11 13    < > = values in this interval not displayed.    Recent Labs  Lab 07/18/23 0250 07/19/23 0314 07/20/23 0327  ALBUMIN  2.8* 2.8* 3.5   Lipids No results for input(s): CHOL, TRIG, HDL, LABVLDL, LDLCALC, CHOLHDL in the last 168 hours.  Hematology Recent Labs  Lab  07/19/23 0314 07/20/23 0327 07/20/23 1125  WBC 7.4 7.5 8.7  RBC 1.93* 1.60* 1.61*  HGB 8.0* 6.5* 6.6*  HCT 23.7* 19.8* 19.7*  MCV 122.8* 123.8* 122.4*  MCH 41.5* 40.6* 41.0*  MCHC 33.8 32.8 33.5  RDW 14.3 14.3 14.3  PLT 94* 83* 91*   Thyroid No results for input(s): TSH, FREET4 in the last 168 hours.  BNPNo results for input(s): BNP, PROBNP in the last 168 hours.  DDimer No results for input(s): DDIMER in the last 168 hours.   Radiology/Studies:  IR NEPHROSTOMY EXCHANGE LEFT Result Date: 07/17/2023 INDICATION: CHRONIC INDWELLING LEFT NEPHROSTOMY, ROUTINE EXCHANGE EXAM: FLUOROSCOPIC LEFT NEPHROSTOMY EXCHANGE COMPARISON:  07/15/2023 MEDICATIONS: 1% LIDOCAINE  LOCAL ANESTHESIA/SEDATION: None. CONTRAST:  10 cc-administered into the collecting system(s) FLUOROSCOPY: Radiation Exposure Index (as provided by the fluoroscopic device): 2.0 mGy Kerma COMPLICATIONS: None immediate. PROCEDURE: Informed written consent was obtained from the patient after a thorough discussion of the procedural risks, benefits and alternatives. All questions were addressed. Maximal Sterile Barrier Technique was utilized including caps, mask, sterile gowns, sterile gloves, sterile drape, hand hygiene and skin antiseptic. A timeout was performed prior to the initiation of the procedure. Under sterile conditions and local anesthesia, the existing retracted 8 French left nephrostomy was exchanged for a new 10 French nephrostomy over an Amplatz guidewire. Retention loop formed in the renal pelvis. Contrast injection confirms position. Images obtained for documentation. Catheter secured with a silk suture. Sterile dressing applied. Gravity drainage bag connected. IMPRESSION: Successful fluoroscopic exchange and upsize of the 10 French left nephrostomy Electronically Signed   By: CHRISTELLA.  Shick M.D.   On: 07/17/2023 08:56     Assessment and Plan:   Paroxymal atrial fibrillation Telemetry on 1/10 appears consistent with  Afib RVR. He was started on amiodarone  IV and transitioned to PO. PO dose reduced to 200 mg for bradycardia and pauses 2.11 sec. Suspect Afib likely related to acute illness. Given bradycardia and pauses, could consider further reducing to  100 mg. Would like to maintain SR given new Hb in the 6 range requiring transfusion. He would have increased risk for recurrence with any procedures.   - baseline RBBB and first degree heart block - now in SR-SB   Acute on chronic anemia Anemia of chronic disease Hb baseline range 8-9, dropped to the 6 range requiring transfusion.    Need for chronic anticoagulation - complicated given new anemia - hx of DVT - eliquis  on hold   ESRD on HD MWF Per nephrology   Hypotension Requires midodrine    Prior proctocolectomy and ileostomy Obstructive uropathy with chronic left nephrostomy Left thigh graft with abscess    Risk Assessment/Risk Scores:      CHA2DS2-VASc Score = 5   This indicates a 7.2% annual risk of stroke. The patient's score is based upon: CHF History: 0 HTN History: 0 Diabetes History: 0 Stroke History: 2 Vascular Disease History: 1 Age Score: 2 Gender Score: 0       For questions or updates, please contact [facility] Please consult [email or web address] for contact info under    Signed, Jon Nat Hails, PA  07/20/2023 2:16 PM   History and all data above reviewed.  Patient examined.  I agree with the findings as above. The patient has a complicated history as above.  He has had PAF and has been on Eliquis  for the last 3 years.   During this admission he has had atrial fib with RVR and has been treated with amiodarone .   He is now having bradyarrhythmias with 2 - 3 second pauses.  He has no symptoms associated with this.  He is currently off of Eliquis  secondary to anemia but has no evidence of active bleeding.   The patient exam reveals COR:RRR  ,  Lungs: Clear  ,  Abd: Positive bowel sounds, no rebound no  guarding, Ext No edema  .  All available labs, radiology testing, previous records reviewed. Agree with documented assessment and plan.   Atrial fib:  Discontinue amiodarone .   Resume Eliquis  if there is no active bleeding.   At this point he has no indication for pacing and is not a candidate for invasive atrial fib management.    Lynwood Kailea Dannemiller  3:55 PM  07/20/2023

## 2023-07-20 NOTE — Progress Notes (Signed)
 Patient consented for blood. Called lab in regards to type and screen orders as blood type resulted but patient does not have a blood bank ID band on; deferred to blood bank. Blood bank said any sample without blood bank ID would be rejected and RN would be called directly in regards if a recollection needed. Awaiting phlebotomist to draw blood, RN attempted to call phlebotomy 4x with no answer.

## 2023-07-20 NOTE — TOC Initial Note (Addendum)
 Transition of Care (TOC) - Initial/Assessment Note  Rayfield Gobble RN, BSN Transitions of Care Unit 4E- RN Case Manager See Treatment Team for direct phone #   Patient Details  Name: Joshua Riley MRN: [id number] Date of Birth: August 17, 1935  Transition of Care [facility]) CM/SW Contact:    Gobble Rayfield Hurst, RN Phone Number: 07/20/2023, 3:47 PM  Clinical Narrative:                 Note pt declining SNF and wants to return home, per ID will need LTIV abx (4wks). HD-MWF.  CM in to speak with pt at bedside- per conversation pt confirmed he lives alone, has daughter that can come by and assist. Note that per PT notes pt has not been ambulatory due to low BP, asked pt what his plan is to be home alone and get to HD- pt indicated that he plans to drive himself. Started Discussion with pt regarding safe discharge plan for his needs, IV abx, drain care, wound care, therapy- pt asking about HH and how often HH would come- explained that [facility] would be intermittent and not daily. Call made to daughter who voiced she would be at the bedside shortly- CM waited on daughter for further discussion.   On daughter's arrival- further discussed recommendations for SNF rehab, daughter discussed with pt current level of needs, family works and are unable to provide 24/7 and voice it would be difficult to meet pt's needs at home. Pt now agreeable to look at SNF placement [facility] for rehab and IV abx needs. Would prefer [facility] [place] but agreeable to anywhere that can provide needs. CSW updated for SNF workup.   Per MD referral -CM reached out to [facility] liaisons to review for possible LTACH- [facility] to review to see if pt meets criteria. (He needs 4 week of Meropenem /imipenem, Out patient HD doesn't have meropenem  on formulary. No good options for central line. He has drain catheter for pelvic abscess, nephrostomy tube, on HD.)  1500- CM has heard back from [facility] liaison who voiced [facility]-  can accept tomorrow if pt/daughter agreeable- [facility] still reviewing at this time.  Call made to daughter to update on LTACH option vs SNF that was discussed earlier. Explained LTACH vs SNF level of care- and offered choice. Per daughter she would like to wait to see if [facility] will also make an offer on a HD bed. CM will update daughter once [facility] has finished reviewing.  MD updated on possible LTACH bed.   1630- update- [facility] has finished reviewing and has also offered bed for tonight or tomorrow as well. Call made back to daughter to offer choice for [facility] or [facility] bed offer- daughter voiced that she would like to accept [facility] bed offer.  CM also spoke with pt at bedside to update and discuss LTACH and bed offers- is is also agreeable to [facility] bed offer.  MD and [facility] liaison updated- potential transition to [facility] tomorrow.  [facility] liaison updated as well.   Expected Discharge Plan: Skilled Nursing Facility Barriers to Discharge: Continued Medical Work up   Patient Goals and CMS Choice Patient states their goals for this hospitalization and ongoing recovery are:: to recover and get better          Expected Discharge Plan and Services In-house Referral: Clinical Social Work Discharge Planning Services: CM Consult   Living arrangements for the past 2 months: Apartment  Prior Living Arrangements/Services Living arrangements for the past 2 months: Apartment Lives with:: Self Patient language and need for interpreter reviewed:: Yes        Need for Family Participation in Patient Care: Yes (Comment) Care giver support system in place?: Yes (comment) Current home services: DME Criminal Activity/Legal Involvement Pertinent to Current Situation/Hospitalization: No - Comment as needed  Activities of Daily Living   ADL Screening (condition at time of admission) Independently performs ADLs?: Yes (appropriate for developmental  age) Is the patient deaf or have difficulty hearing?: Yes Does the patient have difficulty seeing, even when wearing glasses/contacts?: No Does the patient have difficulty concentrating, remembering, or making decisions?: No  Permission Sought/Granted                  Emotional Assessment       Orientation: : Oriented to Self, Oriented to Place, Oriented to  Time, Oriented to Situation Alcohol / Substance Use: Not Applicable Psych Involvement: No (comment)  Admission diagnosis:  Shortness of breath [R06.02] Abscess of left groin [L02.214] Abscess of groin, left [L02.214] Sepsis, due to unspecified organism, unspecified whether acute organ dysfunction present [facility]) [A41.9] Patient Active Problem List   Diagnosis Date Noted   Abscess of abdominal cavity (HCC) 07/17/2023   Abdominal wall abscess 07/14/2023   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   Anastomotic leak of intestine 07/14/2023   Leukocytosis 07/08/2023   Paroxysmal atrial fibrillation (HCC) 07/08/2023   History of COPD 07/08/2023   Chronic diastolic CHF (congestive heart failure) (HCC) 07/08/2023   History of anemia due to chronic kidney disease 07/08/2023   Abscess of groin, left 07/07/2023   Macrocytic anemia 04/08/2023   Depression 04/08/2023   Perirectal abscess 03/23/2023   Infection due to ESBL-producing Escherichia coli 03/23/2023   GI bleeding 10/07/2022   DOE (dyspnea on exertion) 07/22/2022   Coag negative Staphylococcus bacteremia 06/06/2022   Severe sepsis (HCC) 06/02/2022   Ulcerative colitis (HCC) 06/02/2022   End-stage renal disease on hemodialysis (HCC) 05/06/2022   SBO (small bowel obstruction) (HCC) 11/18/2021   Glaucoma 11/18/2021   Atrial fibrillation, persistent (HCC) 11/18/2021   Orthostatic hypotension 10/22/2021   Idiopathic neuropathy 10/22/2021   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   Attention to urostomy (HCC) 10/22/2021   PCP:  Katrinka Garnette KIDD, MD Pharmacy:   [facility] [phone] [place],  - [address] [place] [address] Phone: [phone] Fax: [phone]  [id number] [place], [place] - [address] [place] [address] Phone: [phone] Fax: [phone]  [facility] [address] [place] [address] Phone: [phone] Fax: [phone]     Social Drivers of Health (SDOH) Social History: SDOH Screenings   Food Insecurity: No Food Insecurity (07/08/2023)  Housing: Low Risk  (07/08/2023)  Transportation Needs: No Transportation Needs (07/08/2023)  Utilities: Not At Risk (07/08/2023)  Depression (PHQ2-9): Medium Risk (05/22/2023)  Financial Resource Strain: Low Risk  (05/27/2022)  Physical Activity: Inactive (05/27/2022)  Social Connections: Moderately Integrated (07/08/2023)  Stress: No Stress Concern Present (05/27/2022)  Tobacco Use: Medium Risk (07/08/2023)   SDOH Interventions:     Readmission Risk Interventions     No data to display

## 2023-07-20 NOTE — Progress Notes (Signed)
 Telemetry reported patient with 2 second pause. Upon review patient's telemetry alarms, appears pt with multiple 1-2 second pauses. MD notified, EKG obtained. Dr. Shona advised RN hold am Amiodarone  and Midodrine  until cardiology sees patient (consulted by provider). Additionally, Albumin  repleted.

## 2023-07-20 NOTE — Progress Notes (Signed)
 [facility] Progress Note   Subjective:   Seen in room. Hgb 6.5 this am. Denies melena, says his IV was bleeding. For transfusion today. No cp, sob.   Objective Vitals:   07/19/23 2324 07/20/23 0133 07/20/23 0408 07/20/23 0816  BP: (!) 110/50  (!) 102/51   Pulse:   60   Resp: 15  19   Temp: (!) 97.4 F (36.3 C)  (!) 97.4 F (36.3 C)   TempSrc: Oral  Oral   SpO2: 98%  99% 97%  Weight:  70.8 kg    Height:       Physical Exam General:chronically ill appearing male in NAD Heart:RRR, no mrg Lungs:CTAB, nml WOB on RA Abdomen:soft, NTND Extremities:no LE edema Dialysis Access: L AVG +b/t   Filed Weights   07/17/23 1240 07/18/23 0408 07/20/23 0133  Weight: 66 kg 68.9 kg 70.8 kg    Intake/Output Summary (Last 24 hours) at 07/20/2023 1053 Last data filed at 07/20/2023 0507 Gross per 24 hour  Intake 818.65 ml  Output 500 ml  Net 318.65 ml    Additional Objective Labs: Basic Metabolic Panel: Recent Labs  Lab 07/18/23 0250 07/19/23 0314 07/20/23 0327  NA 133* 134* 133*  K 4.2 4.1 4.1  CL 90* 91* 91*  CO2 30 32 29  GLUCOSE 92 114* 104*  BUN 40* 61* 80*  CREATININE 6.52* 8.16* 10.34*  CALCIUM  8.9 9.1 9.1  PHOS 4.9* 4.2 3.9   Liver Function Tests: Recent Labs  Lab 07/18/23 0250 07/19/23 0314 07/20/23 0327  ALBUMIN  2.8* 2.8* 3.5   CBC: Recent Labs  Lab 07/16/23 0630 07/17/23 0324 07/18/23 0250 07/19/23 0314 07/20/23 0327  WBC 12.6* 7.6 7.9 7.4 7.5  HGB 9.2* 8.6* 7.9* 8.0* 6.5*  HCT 27.7* 25.3* 23.3* 23.7* 19.8*  MCV 123.1* 122.2* 122.0* 122.8* 123.8*  PLT 100* 103* 95* 94* 83*   Blood Culture    Component Value Date/Time   SDES ABSCESS 07/13/2023 1309   SPECREQUEST Normal 07/13/2023 1309   CULT  07/13/2023 1309    ABUNDANT ESCHERICHIA COLI Confirmed Extended Spectrum Beta-Lactamase Producer (ESBL).  In bloodstream infections from ESBL organisms, carbapenems are preferred over piperacillin /tazobactam. They are shown to have a lower  risk of mortality. NO ANAEROBES ISOLATED Sent to [facility] for further susceptibility testing. Performed at [facility], [address], [place], [place] [address]    REPTSTATUS PENDING 07/13/2023 1309   CBG: Recent Labs  Lab 07/13/23 2104 07/14/23 0602 07/19/23 1639  GLUCAP 114* 81 96   Lab Results  Component Value Date   INR 1.1 07/13/2023   INR 1.3 (H) 06/02/2022   Studies/Results: No results found.  Medications:  meropenem  (MERREM ) IV Stopped (07/19/23 1808)   sodium chloride       (feeding supplement) PROSource Plus  30 mL Oral BID BM   sodium chloride    Intravenous Once   sodium chloride    Intravenous Once   acyclovir  ointment   Topical Q3H   Chlorhexidine  Gluconate Cloth  6 each Topical Q0600   darbepoetin (ARANESP ) injection - DIALYSIS  100 mcg Subcutaneous Q Tue-1800   dorzolamide   1 drop Both Eyes BID   ferrous sulfate   325 mg Oral Q breakfast   fluconazole   100 mg Oral Daily   folic acid   1 mg Oral Daily   Gerhardt's butt cream   Topical BID   latanoprost   1 drop Right Eye QHS   magnesium  oxide  400 mg Oral BID   midodrine   10 mg  Oral TID WC   mometasone -formoterol   2 puff Inhalation BID   nystatin    Topical TID   pantoprazole   40 mg Oral BID   sertraline   25 mg Oral QHS   sevelamer  carbonate  2.4 g Oral TID WC   sodium chloride  flush  5 mL Intracatheter Q8H   sodium zirconium cyclosilicate   10 g Oral Daily   timolol   1 drop Both Eyes Daily    Dialysis Orders: MWF - [facility] 2:45hr, 350/A1.5, EDW 68.5kg, 2K/2.5Ca, AVG, heparin  5000 unit bolus - Mircera 120mcg IV q 4 weeks (last 12/18) - Calcitriol  0.62mcg PO q HD   Assessment/Plan: Sepsis/groin abscess: S/p I&D 12/31, Blood Cx negative. Finished course of linezolid .  Presacral wound/fluid collection: Drain placed by IR 1/6, Cx grew ESBL E. Coli. On Meropenem . Pharmacy asked us  to see if Ertapenem  could be ordered by his HD unit and given there. [facility] center requested to order 1/9, no response. Will  reach out again today.  Per ID if unable to obtain at HD will need to have peripheral IV to get antibiotic dosed either at SNF or IV infusion clinic post HD.  ESRD: Usual MWF schedule. Next HD on 07/19/22. Per [facility]VS AVG does not appear infected.  HypoTN/volume: BP low/stable. On mido 10mg  TID.  Does not appear volume overloaded on exam.   Anemia of ESRD: Hgb 6.5, Aranesp  100mcg given 1/7. 2 u prbcs ordered 1/13.  Secondary HPTH: CorrCa high, VDRA remains on hold. Phos high - Changed renvela  to powder due to patient having difficultly swallowing large pill, feeling like it gets stuck in his chest.  Tolerating powder much better. Nutrition: Alb low, continue supplements. A-fib: Hx RVR, better. On amiodarone  + Eliquis .  Maisie Ronnald Acosta PA-C [facility] 07/20/2023,10:53 AM

## 2023-07-20 NOTE — Plan of Care (Signed)
   Problem: Health Behavior/Discharge Planning: Goal: Ability to manage health-related needs will improve Outcome: Progressing   Problem: Clinical Measurements: Goal: Ability to maintain clinical measurements within normal limits will improve Outcome: Progressing

## 2023-07-20 NOTE — Progress Notes (Signed)
 Pt refused breakfast because it was his dialysis day and he did not want to have an accident. I explained that doctor had ordered one unit of blood and he would not let me give. Stated that he would get in HD. I explained that they were busy and he may not go until after lunch. Stated that he would wait. Ate snacks and I called HD to check on timing and they stated after 6 pm maybe 8 pm. Pt ordered him some food. He allowed me to changed dressings and empty JP drain and nephrostomy/ Initially, only wanted it emptied prior to going to HD. Pt again refused blood with this RN. States that he will get in HD. MD made aware. Pt resting with call bell within reach.  Will continue to monitor.

## 2023-07-20 NOTE — Progress Notes (Addendum)
 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  He reported boil  started 3 weeks prior to admission, saw PCP who prescribed doxycycline  for 10 days.  Antibiotics did not help.  Patient admitted with groin abscess and  abscess in presacral space. Develops ileus vs SBO. Surgery re-consulted.    Assessment & Plan:   Principal Problem:   Abscess of abdominal cavity (HCC) Active Problems:   Severe sepsis (HCC)   End-stage renal disease on hemodialysis (HCC)   Depression   Abscess of groin, left   Leukocytosis   Paroxysmal atrial fibrillation (HCC)   History of COPD   Chronic diastolic CHF (congestive heart failure) (HCC)   History of anemia due to chronic kidney disease   Abdominal wall abscess   ESBL E. coli carrier   Anastomotic leak of intestine   1-Sepsis secondary to infected groin abscess, and  -Presacral space 5 x 4.5 cm fluid collection extending into left ischial anal fat slightly  increased from 06/11/2023 -Dr. Royal discussed with Dr. Stevie with general surgery on 07/10/2023--IR placed drain to be managed by Dr. Leigh COTA -ID following,= Culture growing ESBL E coli.  -Needs Imipenem/meropenem  at discharge. Trying HD unit to get approval for antibiotics. If not might need to go to infusion center or SNF for antibiotics.  -Drain care: Patient will need to flush drain QD with 5 cc NS, record output  QD, dressing changes every 2-3 days or earlier if soiled.  Vascular evaluated patient. Graft does not appears infected.  Awaiting to be able to arrange Antibiotics, IV for discharge, he needs Meropenem .   ESRD Monday Wednesday and Friday via [facility] complicated by hypotension Continue with midodrine  Hd HD 1/10  Anemia, of chronic kidney diseases.  Drop in hb  Check for melena, hematochezia.  Thrombocytopenia. Monitor.  IV PP Will transfuse total 2 units.  Nurse report stool brown.   SBO, Vs Ilues;  Avoid narcotics.  KUB ileus Vs Obstruction.  Surgery re-consulted.  KUB with sign of SBO.  Resolved. Tolerating diet.   Joshua-fib on Eliquis  Bradycardia Sinus pauses/.  -Had paroxysmal Joshua-fib this admission and was  started on IV amiodarone  subsequently transition to oral, amiodarone  dose  reduced to 200 due to mild bradycardia -Hold Eliquis , Hb down to 6.5 -Hold Amiodarone -- Bradycardia and sinus pauses.  -Cardiology have been consulted.   Depression: Continue with Zoloft   Obstructive uropathy with chronic left PCN S/P exchange 2 weeks ago On IV antibiotics.  1/09:Tube leaking, and coming off , IR consulted and informed.  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 Rollin GI--not Joshua clear anoscopy/procedural operative candidate    Hypotension, chronic on Midodrine .  Worsening hypotension 1/09  form increase out put form ostomy.  IV bolus given Resolved.   Previous DVT Not on anticoagulation.  CAD: LHC on 07/22/22 which showed  no significant CAD and normal LVEDP.  Yeast pubic; started Nystatin  Powder.   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   See wound care documentation below.  Pressure Injury 07/08/23 Buttocks Bilateral Stage 1 -  Intact skin with non-blanchable redness of Joshua localized  area usually over Joshua bony prominence. redness due to bleeding / pus wound (Active)  07/08/23 1454  Location: Buttocks  Location Orientation: Bilateral  Staging: Stage 1 -  Intact skin with non-blanchable redness of Joshua localized area usually over Joshua bony prominence.  Wound Description (Comments): redness due to bleeding / pus wound  Present on Admission: Yes  Dressing Type Foam - Lift dressing to assess site every shift 07/19/23 2000     Estimated body mass index is 25.97 kg/m as calculated from the following:   Height as of this encounter: 5' 5 (1.651 m).   Weight as of this encounter: 70.8 kg.   DVT prophylaxis: Eliquis  Code Status: Full code Family Communication: daughter at bedside 1/11 Disposition Plan:  Status is: Inpatient Remains inpatient appropriate because: management of infection    Consultants:  Sx ID   Procedures:  none  Antimicrobials:    Subjective: He is alert, denies dizziness. He is aware of sinus passes overnight.   Objective: Vitals:   07/19/23 2033 07/19/23 2324 07/20/23 0133 07/20/23 0408  BP: 105/73 (!) 110/50  (!) 102/51  Pulse:    60  Resp: 14 15  19   Temp: 97.7 F (36.5 C) (!) 97.4 F (36.3 C)  (!) 97.4 F (36.3 C)  TempSrc: Oral Oral  Oral  SpO2: 98% 98%  99%  Weight:   70.8 kg   Height:        Intake/Output Summary (Last 24 hours) at 07/20/2023 0756 Last data filed at 07/20/2023 0507 Gross per 24 hour  Intake 818.65 ml  Output 500 ml  Net 318.65 ml   Filed Weights   07/17/23 1240 07/18/23 0408 07/20/23 0133  Weight: 66 kg 68.9 kg 70.8 kg    Examination:  General exam: NAD Respiratory system:  CTA Cardiovascular system: S 1, S 2 RRR Gastrointestinal system: BS present, soft,  Ostomy bag with fluid Central nervous system:  Alert Extremities: no edema   Data Reviewed: I have personally reviewed following labs and imaging studies  CBC: Recent Labs  Lab 07/16/23 0630 07/17/23 0324 07/18/23 0250 07/19/23 0314  07/20/23 0327  WBC 12.6* 7.6 7.9 7.4 7.5  HGB 9.2* 8.6* 7.9* 8.0* 6.5*  HCT 27.7* 25.3* 23.3* 23.7* 19.8*  MCV 123.1* 122.2* 122.0* 122.8* 123.8*  PLT 100* 103* 95* 94* 83*   Basic Metabolic Panel: Recent Labs  Lab 07/14/23 0346 07/15/23 0358 07/16/23 0630 07/17/23 0324 07/18/23 0250 07/19/23 0314 07/20/23 0327  NA 132*   < > 134* 134* 133* 134* 133*  K 6.1*   < > 5.6* 5.2* 4.2 4.1 4.1  CL 89*   < > 89* 86* 90* 91* 91*  CO2 27   < > 29 33* 30 32 29  GLUCOSE 96   < > 109* 95 92 114* 104*  BUN 70*   < > 67* 77* 40* 61* 80*  CREATININE 10.22*   < > 9.47* 10.16* 6.52* 8.16* 10.34*  CALCIUM  9.0   < > 9.8 9.6 8.9 9.1 9.1  MG 2.0  --   --   --   --   --   --   PHOS 8.3*   < > 7.8* 7.5* 4.9* 4.2 3.9   < > =  values in this interval not displayed.   GFR: Estimated Creatinine Clearance: 4.5 mL/min (Joshua) (by C-G formula based on SCr of 10.34 mg/dL (H)). Liver Function Tests: Recent Labs  Lab 07/16/23 0630 07/17/23 0324 07/18/23 0250 07/19/23 0314 07/20/23 0327  ALBUMIN  2.9* 2.8* 2.8* 2.8* 3.5   No results for input(s): LIPASE, AMYLASE in the last 168 hours. No results for input(s): AMMONIA in the last 168 hours. Coagulation Profile: No results for input(s): INR, PROTIME in the last 168 hours.  Cardiac Enzymes: No results for input(s): CKTOTAL, CKMB, CKMBINDEX, TROPONINI in the last 168 hours. BNP (last 3 results) No results for input(s): PROBNP in the last 8760 hours. HbA1C: No results for input(s): HGBA1C in the last 72 hours. CBG: Recent Labs  Lab 07/13/23 2104 07/14/23 0602 07/19/23 1639  GLUCAP 114* 81 96   Lipid Profile: No results for input(s): CHOL, HDL, LDLCALC, TRIG, CHOLHDL, LDLDIRECT in the last 72 hours. Thyroid Function Tests: No results for input(s): TSH, T4TOTAL, FREET4, T3FREE, THYROIDAB in the last 72 hours. Anemia Panel: No results for input(s): VITAMINB12, FOLATE, FERRITIN, TIBC, IRON,  RETICCTPCT in the last 72 hours. Sepsis Labs: No results for input(s): PROCALCITON, LATICACIDVEN in the last 168 hours.  Recent Results (from the past 240 hours)  Aerobic/Anaerobic Culture w Gram Stain (surgical/deep wound)     Status: None (Preliminary result)   Collection Time: 07/13/23  1:09 PM   Specimen: Abscess  Result Value Ref Range Status   Specimen Description ABSCESS  Final   Special Requests Normal  Final   Gram Stain   Final    ABUNDANT WBC PRESENT, PREDOMINANTLY PMN RARE GRAM POSITIVE COCCI RARE GRAM NEGATIVE RODS    Culture   Final    ABUNDANT ESCHERICHIA COLI Confirmed Extended Spectrum Beta-Lactamase Producer (ESBL).  In bloodstream infections from ESBL organisms, carbapenems are preferred over piperacillin /tazobactam. They are shown to have Joshua lower risk of mortality. NO ANAEROBES ISOLATED Sent to [facility] for further susceptibility testing. Performed at [facility], [address] [place], [place] [address]    Report Status PENDING  Incomplete   Organism ID, Bacteria ESCHERICHIA COLI  Final      Susceptibility   Escherichia coli - MIC*    AMPICILLIN >=32 RESISTANT Resistant     CEFEPIME  >=32 RESISTANT Resistant     CEFTAZIDIME RESISTANT Resistant     CEFTRIAXONE >=64 RESISTANT Resistant     CIPROFLOXACIN >=4 RESISTANT Resistant     GENTAMICIN <=1 SENSITIVE Sensitive     IMIPENEM <=0.25 SENSITIVE Sensitive     TRIMETH/SULFA >=320 RESISTANT Resistant     AMPICILLIN/SULBACTAM >=32 RESISTANT Resistant     PIP/TAZO >=128 RESISTANT Resistant ug/mL    * ABUNDANT ESCHERICHIA COLI         Radiology Studies: No results found.       Scheduled Meds:  (feeding supplement) PROSource Plus  30 mL Oral BID BM   sodium chloride    Intravenous Once   acyclovir  ointment   Topical Q3H   Chlorhexidine  Gluconate Cloth  6 each Topical Q0600   darbepoetin (ARANESP ) injection - DIALYSIS  100 mcg Subcutaneous Q Tue-1800   dorzolamide   1 drop Both Eyes BID    ferrous sulfate   325 mg Oral Q breakfast   fluconazole   100 mg Oral Daily   folic acid   1 mg Oral Daily   Gerhardt's butt cream   Topical BID   latanoprost   1 drop Right Eye QHS   magnesium  oxide  400 mg  Oral BID   midodrine   10 mg Oral TID WC   mometasone -formoterol   2 puff Inhalation BID   nystatin    Topical TID   pantoprazole   40 mg Oral BID   sertraline   25 mg Oral QHS   sevelamer  carbonate  2.4 g Oral TID WC   sodium chloride  flush  5 mL Intracatheter Q8H   sodium zirconium cyclosilicate   10 g Oral Daily   timolol   1 drop Both Eyes Daily   Continuous Infusions:  meropenem  (MERREM ) IV Stopped (07/19/23 1808)   sodium chloride        LOS: 13 days    Time spent: 35 Minutes    Joshua Riley Joshua Kashira Behunin, MD [facility]   If 7PM-7AM, please contact night-coverage [email or web address]  07/20/2023, 7:56 AM

## 2023-07-20 NOTE — Progress Notes (Signed)
 Received a call from bedside RN regarding the patient having multiple 1 to 2-second pauses overnight, at least four episodes while laying in the hospital bed.  Patient with a history of paroxysmal A-fib on Eliquis , chronic hypotension on midodrine , first-degree AV block seen on twelve-lead EKG done earlier this morning.    Presented at bedside.  The patient has no new complaints at the time of the visit.  On exam, lungs are clear to auscultation, bradycardic, euvolemic.  Will hold off any AV nodal agents for now until seen by cardiology.  On-call cardiology, Dr. Terrence, consulted to assist with the management.   Time: 15 minutes.

## 2023-07-20 NOTE — Progress Notes (Signed)
 Referring Physician(s): Kinsinger, Herlene, MD  Supervising Physician: Hughes Simmonds  Patient Status:  [facility] - In-pt  Chief Complaint: Follow up left TG drain placed 07/13/23 in IR Patient is s/p left Pcn exchange due to kink on 07/17/23.   Subjective:  Patient is laying in bed, NAD, family member at bedside.  Reports that he is dong better than yesterday, anticipating d/c to rehab soon.   Allergies: Cephalosporins, Ciprofloxacin, and Baclofen  Medications: Prior to Admission medications   Medication Sig Start Date End Date Taking? Authorizing Provider  acetaminophen  (TYLENOL ) 500 MG tablet Take 1,000 mg by mouth as needed for mild pain (pain score 1-3).   Yes [provider]  albuterol  (PROVENTIL ) (2.5 MG/3ML) 0.083% nebulizer solution Take 2.5 mg by nebulization 2 (two) times daily. 06/17/23  Yes [provider]  BIOTIN PO Take 1 capsule by mouth daily.   Yes [provider]  budesonide  (PULMICORT ) 0.5 MG/2ML nebulizer solution TAKE 2 ML (0.5 MG TOTAL) BY NEBULIZATION TWICE A DAY 06/29/23  Yes Hope Almarie ORN, NP  Cyanocobalamin  (B-12) 1000 MCG SUBL Place 1 tablet under the tongue daily at 6 (six) AM. 04/08/23  Yes Jesus Bernardino MATSU, MD  dorzolamide  (TRUSOPT ) 2 % ophthalmic solution Place 1 drop into both eyes 2 (two) times daily. 06/17/23  Yes [provider]  ELIQUIS  2.5 MG TABS tablet Take 1 tablet (2.5 mg total) by mouth 2 (two) times daily. 08/07/22  Yes Hobart Powell BRAVO, MD  latanoprost  (XALATAN ) 0.005 % ophthalmic solution Place 1 drop into the right eye at bedtime.   Yes [provider]  midodrine  (PROAMATINE ) 10 MG tablet Take 1 tablet (10 mg total) by mouth 3 (three) times daily. Takes 5 mg plus 10mg  for 15 mg total three times a day- started by nephrology in [place]  08/07/22  Yes Pemberton, Powell BRAVO, MD  midodrine  (PROAMATINE ) 5 MG tablet TAKE 1 TABLET 3 TIMES DAILY WITH MEALS. TAKES 5 MG PLUS 10MG  FOR 15 MG TOTAL THREE TIMES A  DAY- STARTED BY NEPHROLOGY IN [place]  10/20/22  Yes Katrinka Garnette KIDD, MD  Omega-3 Fatty Acids (OMEGA-3 FISH OIL) 500 MG CAPS Take 1 capsule by mouth daily.   Yes [provider]  riboflavin (VITAMIN B-2) 100 MG TABS tablet Take 100 mg by mouth daily.   Yes [provider]  sertraline  (ZOLOFT ) 25 MG tablet Take 1 tablet (25 mg total) by mouth daily. Patient taking differently: Take 25 mg by mouth at bedtime. 04/08/23  Yes Jesus Bernardino MATSU, MD  sevelamer  carbonate (RENVELA ) 800 MG tablet Take 2,400 mg by mouth 3 (three) times daily with meals.   Yes [provider]  timolol  (TIMOPTIC ) 0.5 % ophthalmic solution Place 1 drop into both eyes daily.   Yes [provider]  traMADol  (ULTRAM ) 50 MG tablet Take 1 tablet (50 mg total) by mouth every 8 (eight) hours as needed (prn pain). 06/26/23  Yes Hudnell, Corean, NP  WIXELA INHUB 250-50 MCG/ACT AEPB Inhale 1 puff into the lungs 2 (two) times daily. 07/04/23  Yes [provider]  Albuterol  Sulfate 2.5 MG/0.5ML NEBU Inhale 0.5 mLs (2.5 mg total) into the lungs every 6 (six) hours. 07/15/23   Hope Almarie ORN, NP  ramelteon  (ROZEREM ) 8 MG tablet TAKE 1 TABLET BY MOUTH AT BEDTIME. 07/09/23   Katrinka Garnette KIDD, MD     Vital Signs: BP (!) 102/51 (BP Location: Left Arm)   Pulse 60   Temp (!) 97.4 F (36.3 C) (Oral)  Resp 19   Ht 5' 5 (1.651 m)   Wt 156 lb 1.4 oz (70.8 kg)   SpO2 97%   BMI 25.97 kg/m   Physical Exam Vitals and nursing note reviewed.  Constitutional:      General: He is not in acute distress. HENT:     Head: Normocephalic and atraumatic.  Cardiovascular:     Rate and Rhythm: Normal rate.  Pulmonary:     Effort: Pulmonary effort is normal.  Abdominal:     Comments: (+) left TG drain to suction bulb - ~10 cc serosanguinous output in bulb. Insertion site clean, dry, dressed appropriately. Flushes and aspirates well.   L PCN with clear urine in the bag. Site clean and dry, dressed  appropriately,   Skin:    General: Skin is warm and dry.     Coloration: Skin is not cyanotic or pale.  Neurological:     Mental Status: He is alert.  Psychiatric:        Mood and Affect: Mood normal.        Behavior: Behavior normal.     Imaging: IR NEPHROSTOMY EXCHANGE LEFT Result Date: 07/17/2023 INDICATION: CHRONIC INDWELLING LEFT NEPHROSTOMY, ROUTINE EXCHANGE EXAM: FLUOROSCOPIC LEFT NEPHROSTOMY EXCHANGE COMPARISON:  07/15/2023 MEDICATIONS: 1% LIDOCAINE  LOCAL ANESTHESIA/SEDATION: None. CONTRAST:  10 cc-administered into the collecting system(s) FLUOROSCOPY: Radiation Exposure Index (as provided by the fluoroscopic device): 2.0 mGy Kerma COMPLICATIONS: None immediate. PROCEDURE: Informed written consent was obtained from the patient after a thorough discussion of the procedural risks, benefits and alternatives. All questions were addressed. Maximal Sterile Barrier Technique was utilized including caps, mask, sterile gowns, sterile gloves, sterile drape, hand hygiene and skin antiseptic. A timeout was performed prior to the initiation of the procedure. Under sterile conditions and local anesthesia, the existing retracted 8 French left nephrostomy was exchanged for a new 10 French nephrostomy over an Amplatz guidewire. Retention loop formed in the renal pelvis. Contrast injection confirms position. Images obtained for documentation. Catheter secured with a silk suture. Sterile dressing applied. Gravity drainage bag connected. IMPRESSION: Successful fluoroscopic exchange and upsize of the 10 French left nephrostomy Electronically Signed   By: CHRISTELLA.  Shick M.D.   On: 07/17/2023 08:56    Labs:  CBC: Recent Labs    07/17/23 0324 07/18/23 0250 07/19/23 0314 07/20/23 0327  WBC 7.6 7.9 7.4 7.5  HGB 8.6* 7.9* 8.0* 6.5*  HCT 25.3* 23.3* 23.7* 19.8*  PLT 103* 95* 94* 83*    COAGS: Recent Labs    07/13/23 0658 07/16/23 0630 07/16/23 1901 07/17/23 0324  INR 1.1  --   --   --   APTT 44* 31  32 32    BMP: Recent Labs    07/17/23 0324 07/18/23 0250 07/19/23 0314 07/20/23 0327  NA 134* 133* 134* 133*  K 5.2* 4.2 4.1 4.1  CL 86* 90* 91* 91*  CO2 33* 30 32 29  GLUCOSE 95 92 114* 104*  BUN 77* 40* 61* 80*  CALCIUM  9.6 8.9 9.1 9.1  CREATININE 10.16* 6.52* 8.16* 10.34*  GFRNONAA 5* 8* 6* 4*    LIVER FUNCTION TESTS: Recent Labs    04/08/23 1503 07/07/23 1407 07/08/23 0527 07/09/23 0314 07/13/23 0658 07/13/23 0659 07/17/23 0324 07/18/23 0250 07/19/23 0314 07/20/23 0327  BILITOT 0.5 0.7 0.7  --  0.7  --   --   --   --   --   AST 23 22 17   --  13*  --   --   --   --   --  ALT 16 22 18   --  13  --   --   --   --   --   ALKPHOS 80 85 71  --  73  --   --   --   --   --   PROT 7.3 7.1 6.0*  --  6.4*  --   --   --   --   --   ALBUMIN  4.2 3.0* 2.6*   < > 2.9*   < > 2.8* 2.8* 2.8* 3.5   < > = values in this interval not displayed.    Assessment and Plan:  88 y/o M with history of recurrent presacral fluid collection s/p left TG drain placement 07/13/23 in IR seen today for drain follow and PCN exchange. Drain functioning appropriately. Culture (+) e.coli, currently on meropenem  per primary team.   VSS CBC with drop in hgb to 6.5 today, 2 units of blood ordered by [facility] Output serosanguinous, f/a well.   Drain Location: Left transgluteal Size: Fr size: 12 Fr Date of placement: 07/13/23  Currently to: Drain collection device: suction bulb 24 hour output:  Output by Drain (mL) 07/18/23 0701 - 07/18/23 1900 07/18/23 1901 - 07/19/23 0700 07/19/23 0701 - 07/19/23 1900 07/19/23 1901 - 07/20/23 0700 07/20/23 0701 - 07/20/23 1027  Closed System Drain Left Buttock Bulb (JP) 12 Fr. 0 15 20 20      Interval imaging/drain manipulation:  KUB 07/15/23  Current examination: Flushes/aspirates easily.  Insertion site unremarkable. Suture and stat lock in place. Dressed appropriately.   Plan: Continue TID flushes with 5 cc NS. Record output Q shift. Dressing changes QD or PRN  if soiled.  Call IR APP or on call IR MD if difficulty flushing or sudden change in drain output.  Repeat imaging/possible drain injection once output < 10 mL/QD (excluding flush material). Consideration for drain removal if output is < 10 mL/QD (excluding flush material), pending discussion with the providing surgical service.  Discharge planning: Please contact IR APP or on call IR MD prior to patient d/c to ensure appropriate follow up plans are in place. Typically patient will follow up with IR clinic 10-14 days post d/c for repeat imaging/possible drain injection. IR scheduler will contact patient with date/time of appointment. Patient will need to flush drain QD with 5 cc NS, record output QD, dressing changes every 2-3 days or earlier if soiled.   IR will continue to follow - please call with questions or concerns.  Electronically Signed: Toya VEAR Cousin, PA-C 07/20/2023, 10:27 AM   I spent a total of 15 Minutes at the the patient's bedside AND on the patient's hospital floor or unit, greater than 50% of which was counseling/coordinating care for presacral abscess.

## 2023-07-20 NOTE — Progress Notes (Signed)
 Occupational Therapy Treatment Patient Details Name: Joshua Riley MRN: [id number] DOB: [birth year] Today's Date: 07/20/2023   History of present illness 88 y.o. male admitted 07/07/23 with sepsis secondary to L groin abscess. S/p L groin I&D on 12/31. Pt with presacral fluid collection s/p L transgluteal drain placement on 1/6. Also with sacral pressure injury. PMH includes ESRD (HD MWF), ulcerative colitics s/p ileostomy, uretal injuries requiring bilateral PCNs, PAF on Eliquis , COPD, CHF, anemia, depression.   OT comments  Pt making progress with functional goals. Pt's daughter present and pt now agreeable to SNF for post acute rehab. OT answered questions from pt and daughter related to ST SNF therapy setting and this OT answered questions and provided info. Pt c/o feeling light headed with initial stand from EOB to RW. BP 106/71. OT will continue to follow acutely to maximize level of function and safety      If plan is discharge home, recommend the following:  A little help with walking and/or transfers;A lot of help with bathing/dressing/bathroom;Assistance with cooking/housework;Assist for transportation   Equipment Recommendations  Tub/shower seat;BSC/3in1    Recommendations for Other Services      Precautions / Restrictions Precautions Precautions: Fall;Other (comment) Precaution Comments: L nephrostomy tube, L gluteal JP drain; watch BP (hypotensive) Restrictions Weight Bearing Restrictions Per Provider Order: No       Mobility Bed Mobility Overal bed mobility: Modified Independent Bed Mobility: Supine to Sit, Sit to Supine     Supine to sit: Supervision Sit to supine: Supervision        Transfers Overall transfer level: Needs assistance Equipment used: Rolling walker (2 wheels) Transfers: Sit to/from Stand Sit to Stand: Contact guard assist                 Balance Overall balance assessment: Needs assistance Sitting-balance support: Feet supported,  No upper extremity supported Sitting balance-Leahy Scale: Fair     Standing balance support: Bilateral upper extremity supported, During functional activity Standing balance-Leahy Scale: Poor                             ADL either performed or assessed with clinical judgement   ADL Overall ADL's : Needs assistance/impaired     Grooming: Wash/dry hands;Wash/dry face;Contact guard assist;Standing       Lower Body Bathing: Minimal assistance;Sitting/lateral leans Lower Body Bathing Details (indicate cue type and reason): simulated         Toilet Transfer: Contact guard assist;Ambulation;Rolling walker (2 wheels)   Toileting- Clothing Manipulation and Hygiene: Contact guard assist;Sit to/from stand       Functional mobility during ADLs: Contact guard assist;Rolling walker (2 wheels)      Extremity/Trunk Assessment Upper Extremity Assessment Upper Extremity Assessment: Generalized weakness   Lower Extremity Assessment Lower Extremity Assessment: Defer to PT evaluation   Cervical / Trunk Assessment Cervical / Trunk Assessment: Other exceptions Cervical / Trunk Exceptions: forward head    Vision Ability to See in Adequate Light: 0 Adequate Patient Visual Report: No change from baseline     Perception     Praxis      Cognition Arousal: Alert Behavior During Therapy: WFL for tasks assessed/performed Overall Cognitive Status: Within Functional Limits for tasks assessed  Exercises      Shoulder Instructions       General Comments      Pertinent Vitals/ Pain       Pain Assessment Pain Assessment: No/denies pain Pain Score: 0-No pain Pain Intervention(s): Monitored during session, Repositioned  Home Living                                          Prior Functioning/Environment              Frequency  Min 1X/week        Progress Toward Goals  OT  Goals(current goals can now be found in the care plan section)  Progress towards OT goals: Progressing toward goals     Plan      Co-evaluation                 AM-PAC OT 6 Clicks Daily Activity     Outcome Measure   Help from another person eating meals?: None Help from another person taking care of personal grooming?: A Little Help from another person toileting, which includes using toliet, bedpan, or urinal?: A Little Help from another person bathing (including washing, rinsing, drying)?: A Little Help from another person to put on and taking off regular upper body clothing?: A Little Help from another person to put on and taking off regular lower body clothing?: A Lot 6 Click Score: 18    End of Session Equipment Utilized During Treatment: Gait belt;Rolling walker (2 wheels)  OT Visit Diagnosis: Pain;Muscle weakness (generalized) (M62.81)   Activity Tolerance Patient limited by fatigue   Patient Left in bed;with call bell/phone within reach;with bed alarm set   Nurse Communication          Time: 8852-8780 OT Time Calculation (min): 32 min  Charges: OT General Charges $OT Visit: 1 Visit OT Treatments $Self Care/Home Management : 8-22 mins $Therapeutic Activity: 8-22 mins   Jacques Karna Loose 07/20/2023, 1:41 PM

## 2023-07-20 NOTE — Progress Notes (Signed)
 1 unit PRBCs ordered to be transfused for hemoglobin of 6.5K.  Repeat CBC post blood transfusion per protocol.  No charge note.

## 2023-07-21 ENCOUNTER — Inpatient Hospital Stay
Admission: RE | Admit: 2023-07-21 | Discharge: 2023-08-08 | Disposition: A | Discharge status: Skilled Nursing Facility | Payer: Medicare Other | Attending: Internal Medicine | Admitting: Internal Medicine

## 2023-07-21 ENCOUNTER — Other Ambulatory Visit: Payer: Self-pay | Admitting: Internal Medicine

## 2023-07-21 DIAGNOSIS — K651 Peritoneal abscess: Secondary | ICD-10-CM | POA: Diagnosis not present

## 2023-07-21 DIAGNOSIS — F32A Depression, unspecified: Secondary | ICD-10-CM

## 2023-07-21 LAB — RENAL FUNCTION PANEL
Albumin: 3.3 g/dL — ABNORMAL LOW (ref 3.5–5.0)
Anion gap: 12 (ref 5–15)
BUN: 92 mg/dL — ABNORMAL HIGH (ref 8–23)
CO2: 31 mmol/L (ref 22–32)
Calcium: 9.5 mg/dL (ref 8.9–10.3)
Chloride: 91 mmol/L — ABNORMAL LOW (ref 98–111)
Creatinine, Ser: 11.67 mg/dL — ABNORMAL HIGH (ref 0.61–1.24)
GFR, Estimated: 4 mL/min — ABNORMAL LOW (ref >60)
Glucose, Bld: 97 mg/dL (ref 70–99)
Phosphorus: 3.4 mg/dL (ref 2.5–4.6)
Potassium: 4.8 mmol/L (ref 3.5–5.1)
Sodium: 134 mmol/L — ABNORMAL LOW (ref 135–145)

## 2023-07-21 LAB — CBC
HCT: 21.4 % — ABNORMAL LOW (ref 39.0–52.0)
Hemoglobin: 7.2 g/dL — ABNORMAL LOW (ref 13.0–17.0)
MCH: 40.9 pg — ABNORMAL HIGH (ref 26.0–34.0)
MCHC: 33.6 g/dL (ref 30.0–36.0)
MCV: 121.6 fL — ABNORMAL HIGH (ref 80.0–100.0)
Platelets: 102 10*3/uL — ABNORMAL LOW (ref 150–400)
RBC: 1.76 MIL/uL — ABNORMAL LOW (ref 4.22–5.81)
RDW: 14.2 % (ref 11.5–15.5)
WBC: 8.1 10*3/uL (ref 4.0–10.5)
nRBC: 0 % (ref 0.0–0.2)

## 2023-07-21 MED ORDER — FOLIC ACID 1 MG PO TABS: 1.0000 mg | ORAL_TABLET | Freq: Every day | ORAL | 0 refills | Status: DC

## 2023-07-21 MED ORDER — FLUCONAZOLE 100 MG PO TABS: 100.0000 mg | ORAL_TABLET | Freq: Every day | ORAL | 0 refills | Status: DC

## 2023-07-21 MED ORDER — SEVELAMER CARBONATE 2.4 G PO PACK: 2.4000 g | PACK | Freq: Three times a day (TID) | ORAL | 0 refills | Status: DC

## 2023-07-21 MED ORDER — MAGNESIUM OXIDE -MG SUPPLEMENT 400 (240 MG) MG PO TABS: 400.0000 mg | ORAL_TABLET | Freq: Two times a day (BID) | ORAL | 0 refills | Status: DC

## 2023-07-21 MED ORDER — MIDODRINE HCL 10 MG PO TABS: 10.0000 mg | ORAL_TABLET | Freq: Three times a day (TID) | ORAL | 0 refills | Status: DC

## 2023-07-21 MED ORDER — HEPARIN SODIUM (PORCINE) 1000 UNIT/ML DIALYSIS
5000.0000 [IU] | INTRAMUSCULAR | Status: DC | PRN
  Administered 2023-07-21: 5000 [IU] via INTRAVENOUS_CENTRAL
  Filled 2023-07-21 (×3): qty 5

## 2023-07-21 MED ORDER — GERHARDT'S BUTT CREAM: 1.0000 | TOPICAL_CREAM | Freq: Two times a day (BID) | CUTANEOUS | 0 refills | Status: DC

## 2023-07-21 MED ORDER — HEPARIN SODIUM (PORCINE) 1000 UNIT/ML DIALYSIS: 5000.0000 [IU] | Freq: Once | INTRAMUSCULAR | Status: DC

## 2023-07-21 MED ORDER — APIXABAN 2.5 MG PO TABS
2.5000 mg | ORAL_TABLET | Freq: Two times a day (BID) | ORAL | Status: DC
  Administered 2023-07-21: 2.5 mg via ORAL
  Filled 2023-07-21: qty 1

## 2023-07-21 MED ORDER — SODIUM CHLORIDE 0.9 % IV SOLN: 1.0000 g | INTRAVENOUS | 0 refills | Status: DC

## 2023-07-21 MED ORDER — MELATONIN 3 MG PO TABS: 3.0000 mg | ORAL_TABLET | Freq: Every evening | ORAL | 0 refills | Status: DC | PRN

## 2023-07-21 MED ORDER — NYSTATIN 100000 UNIT/GM EX POWD: Freq: Three times a day (TID) | CUTANEOUS | 0 refills | Status: DC

## 2023-07-21 MED ORDER — PANTOPRAZOLE SODIUM 40 MG PO TBEC: 40.0000 mg | DELAYED_RELEASE_TABLET | Freq: Two times a day (BID) | ORAL | 0 refills | Status: DC

## 2023-07-21 MED ORDER — FERROUS SULFATE 325 (65 FE) MG PO TABS: 325.0000 mg | ORAL_TABLET | Freq: Every day | ORAL | 3 refills | Status: DC

## 2023-07-21 NOTE — Progress Notes (Signed)
   07/21/23 1225  Vitals  Temp (!) 96.4 F (35.8 C)  Pulse Rate (!) 50  Resp 16  BP 132/66  SpO2 100 %  O2 Device Room Air  Oxygen Therapy  Patient Activity (if Appropriate) In bed  Pulse Oximetry Type Continuous  Oximetry Probe Site Changed No  Post Treatment  Dialyzer Clearance Lightly streaked  Hemodialysis Intake (mL) 315 mL (Patient received 1 unit of blood during treatment.)  Liters Processed 63  Fluid Removed (mL) 100 mL  Tolerated HD Treatment No (Comment) (Fluid removal stopped for remainder of treatment due to hypotension. Vitals are stable at this time.)   Received patient in bed to unit.  Alert and oriented.  Informed consent signed and in chart.   TX duration:3 hours  Patient tolerated well.  Transported back to the room  Alert, without acute distress.  Hand-off given to patient's nurse.   Access used: Left thigh graft Access issues: None  Total UF removed: 0.1L Medication(s) given: None

## 2023-07-21 NOTE — Progress Notes (Signed)
 D/C order noted. Contacted [facility] to be advised that pt will d/c to [facility] today.   Olivia Canter Renal Navigator [phone]

## 2023-07-21 NOTE — Care Management Important Message (Signed)
 Important Message  Patient Details  Name: Joshua Riley MRN: [id number] Date of Birth: 07-03-1936   Important Message Given:  Yes - Medicare IM     [name] 07/21/2023, 11:29 AM

## 2023-07-21 NOTE — TOC Transition Note (Signed)
 Transition of Care [facility]) - Discharge Note Rayfield Gobble RN, BSN Transitions of Care Unit 4E- RN Case Manager See Treatment Team for direct phone #   Patient Details  Name: Joshua Riley MRN: [id number] Date of Birth: Dec 01, 1935  Transition of Care [facility]) CM/SW Contact:  Gobble Rayfield Hurst, RN Phone Number: 07/21/2023, 12:26 PM   Clinical Narrative:    Pt stable for transition to [facility] today after HD.  [facility] liaison has confirmed that HD bed is available for today, pt and daughter are agreeable to transition to [facility].   Pt room assignment at [facility] is 5E16 Accepting MD- Corean Daring.   Bedside RN can call report to [phone] prior to transfer.   CM has updated daughter [name], as well as Retail Banker. Will plan to transition pt later this afternoon after return from HD.   No further TOC needs noted.    Final next level of care: Long Term Acute Care (LTAC) Barriers to Discharge: Barriers Resolved   Patient Goals and CMS Choice Patient states their goals for this hospitalization and ongoing recovery are:: to recover and get better CMS Medicare.gov Compare Post Acute Care list provided to:: Patient Choice offered to / list presented to : Patient, Adult Children      Discharge Placement               LTACH- [facility].         Discharge Plan and Services Additional resources added to the After Visit Summary for   In-house Referral: Clinical Social Work Discharge Planning Services: CM Consult Post Acute Care Choice: Skilled Nursing Facility, Long Term Acute Care (LTAC)          DME Arranged: N/A DME Agency: NA       HH Arranged: NA HH Agency: NA        Social Drivers of Health (SDOH) Interventions SDOH Screenings   Food Insecurity: No Food Insecurity (07/08/2023)  Housing: Low Risk  (07/08/2023)  Transportation Needs: No Transportation Needs (07/08/2023)  Utilities: Not At Risk (07/08/2023)  Depression (PHQ2-9): Medium Risk (05/22/2023)   Financial Resource Strain: Low Risk  (05/27/2022)  Physical Activity: Inactive (05/27/2022)  Social Connections: Moderately Integrated (07/08/2023)  Stress: No Stress Concern Present (05/27/2022)  Tobacco Use: Medium Risk (07/08/2023)     Readmission Risk Interventions    07/21/2023   12:26 PM  Readmission Risk Prevention Plan  Transportation Screening Complete  HRI or Home Care Consult Complete  Social Work Consult for Recovery Care Planning/Counseling Complete  Palliative Care Screening Not Applicable  Medication Review Oceanographer) Complete

## 2023-07-21 NOTE — Discharge Summary (Signed)
 Physician Discharge Summary   Patient: Joshua Riley MRN: [id number] DOB: [birth year]  Admit date:     07/07/2023  Discharge date: 07/21/23  Discharge Physician: Owen DELENA Lore   PCP: Katrinka Garnette KIDD, MD   Recommendations at discharge:   Needs 4 weeks IV meropenem .  Monitor HB.  Monitor for A fib RVR Needs to work with PT>   Discharge Diagnoses: Principal Problem:   Abscess of abdominal cavity (HCC) Active Problems:   Severe sepsis (HCC)   End-stage renal disease on hemodialysis (HCC)   Depression   Abscess of groin, left   Leukocytosis   Paroxysmal atrial fibrillation (HCC)   History of COPD   Chronic diastolic CHF (congestive heart failure) (HCC)   History of anemia due to chronic kidney disease   Abdominal wall abscess   ESBL E. coli carrier   Anastomotic leak of intestine  Resolved Problems:   * No resolved hospital problems. 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  He reported boil  started 3 weeks prior to admission, saw PCP who prescribed doxycycline  for 10 days.  Antibiotics did not help.   Patient admitted with groin abscess and  abscess in presacral space. Develops ileus vs SBO. Surgery re-consulted.       Assessment and Plan: 1-Sepsis secondary to infected groin abscess, and  -Presacral space 5 x 4.5 cm fluid collection extending into left ischial anal fat slightly  increased from 06/11/2023 -Dr. Royal discussed with Dr. Stevie with general surgery on 07/10/2023--IR placed  drain to be managed by Dr. Leigh COTA -ID following,= Culture growing ESBL E coli.  -Needs Imipenem/meropenem  at discharge. Trying HD unit to get approval for antibiotics. If not might need to go to infusion center or SNF for antibiotics.  -Drain care: Patient will need to flush drain QD with 5 cc NS, record output QD, dressing changes every 2-3 days or earlier if soiled.  Vascular evaluated patient. Graft does not appears infected.  Needs 4 weeks IV meropenem /    ESRD Monday Wednesday and Friday via [facility] complicated by hypotension Continue with midodrine  Hd HD 1/10   Anemia, of chronic kidney diseases.  Drop in hb  Check for melena, hematochezia.  Thrombocytopenia. Monitor.  IV PP Refuse blood transfusion yesterday, plan to get one unit today with HD Nurse report stool brown.    SBO, Vs Ilues;  Avoid narcotics.  KUB ileus Vs Obstruction.  Surgery re-consulted.  KUB with sign of SBO.  Resolved. Tolerating diet.    A-fib on Eliquis  Bradycardia Sinus pauses/.  -Had paroxysmal A-fib this admission and was  started on IV amiodarone  subsequently transition to oral, amiodarone  dose  reduced to 200 due to mild bradycardia -Amiodarone  discontinue-- Bradycardia and sinus pauses.  -Cardiology have been consulted. Plan to stop Amiodarone . Use metoprolol  if he develops RVR Hb at  7, without blood transfusion. No evidence of bleeding. Resume eliquis .    Depression: Continue with Zoloft    Obstructive uropathy with chronic left PCN S/P exchange 2 weeks ago On IV antibiotics.  1/09:Tube leaking, and coming off , IR consulted and informed.  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 Rollin GI--not a clear anoscopy/procedural operative candidate      Hypotension, chronic on Midodrine .  Worsening hypotension  1/09  form increase out put form ostomy.  IV bolus given Resolved.    Previous DVT Not on anticoagulation.  CAD: LHC on 07/22/22 which showed no significant CAD and normal LVEDP.  Yeast pubic; started Nystatin  Powder.    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    See wound care documentation below.      Pressure Injury 07/08/23 Buttocks Bilateral Stage 1 -  Intact skin with non-blanchable redness of a localized area usually over a bony prominence. redness due to bleeding / pus wound (Active)  07/08/23 1454  Location: Buttocks  Location Orientation: Bilateral  Staging: Stage 1 -  Intact skin with non-blanchable redness of a localized area usually over a bony prominence.  Wound Description (Comments): redness due to bleeding / pus wound  Present on Admission: Yes  Dressing Type Foam - Lift dressing to assess site every shift 07/19/23 2000        Estimated body mass index is 25.97 kg/m as calculated from the following:   Height as of this encounter: 5' 5 (1.651 m).   Weight as of this encounter: 70.8 kg.           Consultants: Vascular, Cardiology, nephrology  Procedures performed: Multiples  Disposition: Long term care facility Diet recommendation:  Discharge Diet Orders (From admission, onward)     Start     Ordered   07/21/23 0000  Diet - low sodium heart healthy        07/21/23 1050           Cardiac diet DISCHARGE MEDICATION: Allergies as of 07/21/2023       Reactions   Cephalosporins Rash   Ciprofloxacin Itching, Rash   Baclofen Other (See Comments)   Altered mental status, after accidental overdose        Medication List     STOP taking these medications    albuterol  (2.5 MG/3ML) 0.083% nebulizer solution Commonly known as: PROVENTIL  Replaced by: Albuterol  Sulfate 2.5 MG/0.5ML Nebu You also have another medication with the same name that you need to continue taking as instructed.   sevelamer  carbonate 800 MG tablet Commonly known as:  RENVELA  Replaced by: sevelamer  carbonate 2.4 g Pack       TAKE these medications    acetaminophen  500 MG tablet Commonly known as: TYLENOL  Take 1,000 mg by mouth as needed for mild pain (pain score 1-3).   albuterol  (2.5 MG/3ML) 0.083% nebulizer solution Commonly known as: PROVENTIL  Take 2.5 mg by nebulization 2 (two) times daily. What changed:  Another medication with the same name was added. Make sure you understand how and when to take each. Another medication with the same name was removed. Continue taking this medication, and follow the directions you see here.   Albuterol  Sulfate 2.5 MG/0.5ML Nebu Inhale 0.5 mLs (2.5 mg total) into the lungs every 6 (six) hours. What changed: You were already taking a medication with the same name, and this prescription was added. Make sure you understand how and when to take each. Replaces: albuterol  (2.5 MG/3ML) 0.083% nebulizer solution   B-12 1000 MCG Subl Place 1 tablet under the tongue daily at 6 (six) AM.  BIOTIN PO Take 1 capsule by mouth daily.   budesonide  0.5 MG/2ML nebulizer solution Commonly known as: PULMICORT  TAKE 2 ML (0.5 MG TOTAL) BY NEBULIZATION TWICE A DAY   dorzolamide  2 % ophthalmic solution Commonly known as: TRUSOPT  Place 1 drop into both eyes 2 (two) times daily.   Eliquis  2.5 MG Tabs tablet Generic drug: apixaban  Take 1 tablet (2.5 mg total) by mouth 2 (two) times daily.   ferrous sulfate  325 (65 FE) MG tablet Take 1 tablet (325 mg total) by mouth daily with breakfast.   fluconazole  100 MG tablet Commonly known as: DIFLUCAN  Take 1 tablet (100 mg total) by mouth daily.   folic acid  1 MG tablet Commonly known as: FOLVITE  Take 1 tablet (1 mg total) by mouth daily.   Gerhardt's butt cream Crea Apply 1 Application topically 2 (two) times daily.   latanoprost  0.005 % ophthalmic solution Commonly known as: XALATAN  Place 1 drop into the right eye at bedtime.   magnesium  oxide 400 (240 Mg) MG  tablet Commonly known as: MAG-OX Take 1 tablet (400 mg total) by mouth 2 (two) times daily.   melatonin 3 MG Tabs tablet Take 1 tablet (3 mg total) by mouth at bedtime as needed (insomnia).   meropenem  1 g in sodium chloride  0.9 % 100 mL Inject 1 g into the vein daily.   midodrine  10 MG tablet Commonly known as: PROAMATINE  Take 1 tablet (10 mg total) by mouth 3 (three) times daily with meals. What changed:  when to take this additional instructions Another medication with the same name was removed. Continue taking this medication, and follow the directions you see here.   nystatin  powder Commonly known as: MYCOSTATIN /NYSTOP  Apply topically 3 (three) times daily.   Omega-3 Fish Oil 500 MG Caps Take 1 capsule by mouth daily.   pantoprazole  40 MG tablet Commonly known as: PROTONIX  Take 1 tablet (40 mg total) by mouth 2 (two) times daily.   ramelteon  8 MG tablet Commonly known as: ROZEREM  TAKE 1 TABLET BY MOUTH AT BEDTIME.   riboflavin 100 MG Tabs tablet Commonly known as: VITAMIN B-2 Take 100 mg by mouth daily.   sertraline  25 MG tablet Commonly known as: ZOLOFT  TAKE 1 TABLET (25 MG TOTAL) BY MOUTH DAILY. What changed: when to take this   sevelamer  carbonate 2.4 g Pack Commonly known as: RENVELA  Take 2.4 g by mouth 3 (three) times daily with meals. Replaces: sevelamer  carbonate 800 MG tablet   timolol  0.5 % ophthalmic solution Commonly known as: TIMOPTIC  Place 1 drop into both eyes daily.   traMADol  50 MG tablet Commonly known as: ULTRAM  Take 1 tablet (50 mg total) by mouth every 8 (eight) hours as needed (prn pain).   Wixela Inhub 250-50 MCG/ACT Aepb Generic drug: fluticasone -salmeterol Inhale 1 puff into the lungs 2 (two) times daily.               Discharge Care Instructions  (From admission, onward)           Start     Ordered   07/21/23 0000  Discharge wound care:       Comments: See above   07/21/23 1050            Follow-up  Information     Ann Fine, MD. Go on 07/31/2023.   Specialty: General Surgery Why: 1/24 at 3:00pm, Please arrive 15 minutes early to complete check in, and bring photo ID and insurance card. Contact information: [phone]  [place] [address] [phone]         Polly Cordella LABOR, MD Follow up in 1 week(s).   Specialty: General Surgery Contact information: [address] [place] [address] [phone]                Discharge Exam: Fredricka Weights   07/20/23 0133 07/21/23 0541 07/21/23 0846  Weight: 70.8 kg 70.9 kg 69.2 kg   General; NAD  Condition at discharge: stable  The results of significant diagnostics from this hospitalization (including imaging, microbiology, ancillary and laboratory) are listed below for reference.   Imaging Studies: IR NEPHROSTOMY EXCHANGE LEFT Result Date: 07/17/2023 INDICATION: CHRONIC INDWELLING LEFT NEPHROSTOMY, ROUTINE EXCHANGE EXAM: FLUOROSCOPIC LEFT NEPHROSTOMY EXCHANGE COMPARISON:  07/15/2023 MEDICATIONS: 1% LIDOCAINE  LOCAL ANESTHESIA/SEDATION: None. CONTRAST:  10 cc-administered into the collecting system(s) FLUOROSCOPY: Radiation Exposure Index (as provided by the fluoroscopic device): 2.0 mGy Kerma COMPLICATIONS: None immediate. PROCEDURE: Informed written consent was obtained from the patient after a thorough discussion of the procedural risks, benefits and alternatives. All questions were addressed. Maximal Sterile Barrier Technique was utilized including caps, mask, sterile gowns, sterile gloves, sterile drape, hand hygiene and skin antiseptic. A timeout was performed prior to the initiation of the procedure. Under sterile conditions and local anesthesia, the existing retracted 8 French left nephrostomy was exchanged for a new 10 French nephrostomy over an Amplatz guidewire. Retention loop formed in the renal pelvis. Contrast injection confirms position. Images obtained for  documentation. Catheter secured with a silk suture. Sterile dressing applied. Gravity drainage bag connected. IMPRESSION: Successful fluoroscopic exchange and upsize of the 10 French left nephrostomy Electronically Signed   By: CHRISTELLA.  Shick M.D.   On: 07/17/2023 08:56   DG Abd 1 View Result Date: 07/15/2023 CLINICAL DATA:  History of abscess in the left groin abdomen pain EXAM: ABDOMEN - 1 VIEW COMPARISON:  CT 07/07/2023 FINDINGS: Small amount of radiopaque material in the region of the stomach. Ostomy in the right mid quadrant. History of total colectomy. Interval air distension of small bowel measuring up to 4.8 cm. Penile implant with reservoir in the right pelvis. Left abdominal nephrostomy tube. Interim placement pelvic drainage catheter with pigtail at the inferior pelvis. Question kinked appearance of the catheter along the left side wall. IMPRESSION: 1. Interval air distension of small bowel measuring up to 4.8 cm, possible ileus or obstruction. 2. Interim placement of pelvic drainage catheter with pigtail at the inferior pelvis. Question kinked appearance of the catheter along the left side wall. Electronically Signed   By: Luke Bun M.D.   On: 07/15/2023 16:43   CT GUIDED VISCERAL FLUID DRAIN BY PERC CATH Result Date: 07/13/2023 INDICATION: 88 year old male with a history of chronic recurrent presacral abscess. He presents for repeat drain placement. EXAM: CT-guided drain placement MEDICATIONS: The patient is currently admitted to the hospital and receiving intravenous antibiotics. The antibiotics were administered within an appropriate time frame prior to the initiation of the procedure. ANESTHESIA/SEDATION: Moderate (conscious) sedation was employed during this procedure. A total of Versed  2 mg and Fentanyl  100 mcg was administered intravenously by the radiology nurse. Total intra-service moderate Sedation Time: 13 minutes. The patient's level of consciousness and vital signs were monitored  continuously by radiology nursing throughout the procedure under my direct supervision. COMPLICATIONS: None immediate. PROCEDURE: Informed written consent was obtained from the patient after a thorough discussion of the procedural risks, benefits and alternatives. All questions were addressed. Maximal Sterile Barrier Technique was utilized including caps, mask, sterile  gowns, sterile gloves, sterile drape, hand hygiene and skin antiseptic. A timeout was performed prior to the initiation of the procedure. A planning axial CT scan was performed. The complex presacral fluid collection is identified. A suitable skin entry site was selected and marked. The overlying skin was sterilely prepped and draped in the standard fashion using chlorhexidine  skin prep. Local anesthesia was attained by infiltration with 1% lidocaine . A small dermatotomy was made. Under intermittent CT guidance, an 18 gauge trocar needle was advanced along a left parasacral transgluteal approach into the fluid collection. A 0.035 wire was then coiled in the fluid collection. The needle was removed. The percutaneous tract was dilated to 12 French. A Cook 12 French all-purpose drainage catheter was advanced over the wire and formed. Aspiration yields approximately 75 mL thick purulent fluid. Postoperative CT imaging demonstrates a well-positioned drainage catheter. Of note, there is a vacuum space created by aspiration of the fluid without collapse. This is consistent with a chronic cavity. The drainage catheter was flushed and connected to JP bulb suction before being secured to the skin with 0 Prolene suture. IMPRESSION: 1. Successful placement of a 12 French drainage catheter via a left transgluteal approach with evacuation of 75 mL thick purulent fluid. A sample was sent for Gram stain and culture. 2. Postoperative CT imaging demonstrates a vacuum space within the evacuated thick walled cavity rather than collapse of the cavity. This suggesting a  chronic space which will be prone to recurrent abscess formation. Electronically Signed   By: Wilkie Lent M.D.   On: 07/13/2023 13:35   CT ABDOMEN PELVIS W CONTRAST Result Date: 07/07/2023 CLINICAL DATA:  Groin abscess.  Dyspnea. EXAM: CT ABDOMEN AND PELVIS WITH CONTRAST TECHNIQUE: Multidetector CT imaging of the abdomen and pelvis was performed using the standard protocol following bolus administration of intravenous contrast. RADIATION DOSE REDUCTION: This exam was performed according to the departmental dose-optimization program which includes automated exposure control, adjustment of the mA and/or kV according to patient size and/or use of iterative reconstruction technique. CONTRAST:  75mL OMNIPAQUE  IOHEXOL  350 MG/ML SOLN COMPARISON:  06/11/2023 CT abdomen/pelvis. FINDINGS: Lower chest: No acute abnormality at the lung bases. Hepatobiliary: Normal liver size. No liver mass. Cholelithiasis. No gallbladder distention. No definite gallbladder wall thickening. No pericholecystic fluid. No biliary ductal dilatation. Pancreas: Generalized pancreatic atrophy with no discrete mass or significant duct dilation, unchanged since baseline 11/17/2021 CT. Spleen: Normal size. No mass. Adrenals/Urinary Tract: Normal adrenals. Severe bilateral renal atrophy, unchanged. Simple 1.4 cm posterior upper right renal cyst and exophytic simple 1.5 cm interpolar left renal cyst with additional subcentimeter hypodense left renal cortical lesions that are too small to characterize, unchanged, for which no follow-up imaging is recommended. No hydronephrosis. Left percutaneous pigtail nephrostomy tube with tip in the lower left renal collecting system. Stable relatively collapsed bladder with irregular diffuse bladder wall thickening. Stomach/Bowel: Normal non-distended stomach. Status post total proctocolectomy with end ileostomy in the ventral right abdominal wall with chronic parastomal herniation of a right abdominal small  bowel loop. No dilated or thick-walled small bowel loops. No pneumatosis. Chronic irregular thick walled presacral space 5.7 x 4.5 cm fluid collection (series 3/image 64), extending into the left ischio-anal fat, previously 4.6 x 4.0 cm on 06/11/2023 CT, slightly increased. Vascular/Lymphatic: Atherosclerotic nonaneurysmal abdominal aorta. Patent portal, splenic, hepatic and renal veins. No pathologically enlarged lymph nodes in the abdomen or pelvis. Reproductive: Stable mild prostatomegaly. 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 Other: No pneumoperitoneum. No ascites. Partially visualized surgical  grafts arising from the left common femoral artery and vein and extending inferiorly in the subcutaneous ventral left thigh with stable small 1.9 x 1.6 cm fluid collection at the anterior origin of the surgical grafts (series 3/image 73). Musculoskeletal: No aggressive appearing focal osseous lesions. Moderate thoracolumbar spondylosis. 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 IMPRESSION: 1. Chronic irregular thick walled presacral space 5.7 x 4.5 cm fluid collection, extending into the left ischio-anal fat, slightly increased in size since 06/11/2023 CT. 2. Partially visualized surgical grafts arising from the left common femoral artery and vein and extending inferiorly in the subcutaneous ventral left thigh with stable small 1.9 x 1.6 cm fluid collection at the anterior origin of the surgical grafts. 3. 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 No evidence of acute small bowel complication. 4. Cholelithiasis. 5. Left percutaneous pigtail nephrostomy tube with tip in place in the lower left renal collecting system. No hydronephrosis. 6. Stable relatively collapsed bladder with irregular diffuse bladder wall thickening,  nonspecific. Mild prostatomegaly. 7.  Aortic Atherosclerosis (ICD10-I70.0). Electronically Signed   By: Selinda DELENA Blue M.D.   On: 07/07/2023 18:25   DG Chest Portable 1 View Result Date: 07/07/2023 CLINICAL DATA:  Dyspnea EXAM: PORTABLE CHEST 1 VIEW COMPARISON:  04/10/2023 chest radiograph. FINDINGS: Vascular stent overlies the right subclavian region. Stable cardiomediastinal silhouette with normal heart size. No pneumothorax. No pleural effusion. No pulmonary edema. No acute consolidative airspace disease. Chronic streaky mild left lung base scarring versus atelectasis. IMPRESSION: No active disease. Chronic streaky mild left lung base scarring versus atelectasis. Electronically Signed   By: Selinda DELENA Blue M.D.   On: 07/07/2023 18:12   IR NEPHROSTOMY EXCHANGE LEFT Result Date: 06/25/2023 INDICATION: 88 year old male with history of chronic indwelling left nephrostomy tube presenting for routine check and exchange. EXAM: FLUOROSCOPIC GUIDED left SIDED NEPHROSTOMY CATHETER EXCHANGE COMPARISON:  05/07/2023, 06/11/2023 CONTRAST:  A total of 10 mL Isovue -300 administered was administered into the collecting system FLUOROSCOPY TIME:  Sixteen mGy COMPLICATIONS: None immediate. TECHNIQUE: Informed written consent was obtained from the patient after a discussion of the risks, benefits and alternatives to treatment. Questions regarding the procedure were encouraged and answered. A timeout was performed prior to the initiation of the procedure. The left flank and external portions of existing nephrostomy catheter was prepped and draped in the usual sterile fashion. A sterile drape was applied covering the operative field. Maximum barrier sterile technique with sterile gowns and gloves were used for the procedure. A timeout was performed prior to the initiation of the procedure. A pre procedural spot fluoroscopic image was obtained. A small amount of contrast was injected via the existing nephrostomy catheter  demonstrating retraction of the indwelling nephrostomy into the Peri renal tissues. Adjacent to the indwelling nephrostomy, a 5 French angled tip catheter and Glidewire were directed into the collecting system. The wire was exchanged for an Amplatz wire over which a new 8.5 French Dawson Mueller drainage catheter was placed. The pigtail portion was coiled and locked within the renal pelvis. Limited contrast injection confirmed appropriate positioning within the renal pelvis and a post exchange fluoroscopic image was obtained. The catheter was secured to the skin with an interrupted suture and reconnected to a gravity bag. A dressing was placed. The patient tolerated the procedure well without immediate postprocedural complication. FINDINGS: The existing nephrostomy catheter is retracted into the  perinephric tissues. After successful fluoroscopic guided recanalization a new 8.5French Letha Lacks nephrostomy catheter is coiled and locked within the renal pelvis. IMPRESSION: Successful fluoroscopic guided tract recanalization and placement of left 8.5 French Dawson Mueller percutaneous nephrostomy catheter. Ester Sides, MD [facility] Electronically Signed   By: Ester Sides M.D.   On: 06/25/2023 21:12    Microbiology: Results for orders placed or performed during the hospital encounter of 07/07/23  Blood culture (routine x 2)     Status: None   Collection Time: 07/07/23  2:07 PM   Specimen: BLOOD  Result Value Ref Range Status   Specimen Description BLOOD RIGHT ANTECUBITAL  Final   Special Requests   Final    BOTTLES DRAWN AEROBIC AND ANAEROBIC Blood Culture adequate volume   Culture   Final    NO GROWTH 5 DAYS Performed at [facility], [address], [place], [place] [address]    Report Status 07/12/2023 FINAL  Final  Blood culture (routine x 2)     Status: None   Collection Time: 07/07/23  2:08 PM   Specimen: BLOOD LEFT  FOREARM  Result Value Ref Range Status   Specimen Description BLOOD LEFT FOREARM  Final   Special Requests   Final    BOTTLES DRAWN AEROBIC ONLY Blood Culture results may not be optimal due to an inadequate volume of blood received in culture bottles   Culture   Final    NO GROWTH 5 DAYS Performed at [facility], [address] [place], [place] [address]    Report Status 07/12/2023 FINAL  Final  Resp panel by RT-PCR (RSV, Flu A&B, Covid) Anterior Nasal Swab     Status: None   Collection Time: 07/07/23  3:46 PM   Specimen: Anterior Nasal Swab  Result Value Ref Range Status   SARS Coronavirus 2 by RT PCR NEGATIVE NEGATIVE Final   Influenza A by PCR NEGATIVE NEGATIVE Final   Influenza B by PCR NEGATIVE NEGATIVE Final    Comment: (NOTE) The Xpert Xpress SARS-CoV-2/FLU/RSV plus assay is intended as an aid in the diagnosis of influenza from Nasopharyngeal swab specimens and should not be used as a sole basis for treatment. Nasal washings and aspirates are unacceptable for Xpert Xpress SARS-CoV-2/FLU/RSV testing.  Fact Sheet for Patients: bloggercourse.com  Fact Sheet for Healthcare Providers: seriousbroker.it  This test is not yet approved or cleared by the [place]  FDA and has been authorized for detection and/or diagnosis of SARS-CoV-2 by FDA under an Emergency Use Authorization (EUA). This EUA will remain in effect (meaning this test can be used) for the duration of the COVID-19 declaration under Section 564(b)(1) of the Act, 21 U.S.C. section 360bbb-3(b)(1), unless the authorization is terminated or revoked.     Resp Syncytial Virus by PCR NEGATIVE NEGATIVE Final    Comment: (NOTE) Fact Sheet for Patients: bloggercourse.com  Fact Sheet for Healthcare Providers: seriousbroker.it  This test is not yet approved or cleared by the [place]  FDA and has been  authorized for detection and/or diagnosis of SARS-CoV-2 by FDA under an Emergency Use Authorization (EUA). This EUA will remain in effect (meaning this test can be used) for the duration of the COVID-19 declaration under Section 564(b)(1) of the Act, 21 U.S.C. section 360bbb-3(b)(1), unless the authorization is terminated or revoked.  Performed at [facility], [address], [place], [place] [address]   Aerobic/Anaerobic Culture w Gram Stain (surgical/deep wound)  Status: None (Preliminary result)   Collection Time: 07/13/23  1:09 PM   Specimen: Abscess  Result Value Ref Range Status   Specimen Description ABSCESS  Final   Special Requests Normal  Final   Gram Stain   Final    ABUNDANT WBC PRESENT, PREDOMINANTLY PMN RARE GRAM POSITIVE COCCI RARE GRAM NEGATIVE RODS    Culture   Final    ABUNDANT ESCHERICHIA COLI Confirmed Extended Spectrum Beta-Lactamase Producer (ESBL).  In bloodstream infections from ESBL organisms, carbapenems are preferred over piperacillin /tazobactam. They are shown to have a lower risk of mortality. NO ANAEROBES ISOLATED Sent to [facility] for further susceptibility testing. Performed at [facility], [address], [place], [place] [address]    Report Status PENDING  Incomplete   Organism ID, Bacteria ESCHERICHIA COLI  Final      Susceptibility   Escherichia coli - MIC*    AMPICILLIN >=32 RESISTANT Resistant     CEFEPIME  >=32 RESISTANT Resistant     CEFTAZIDIME RESISTANT Resistant     CEFTRIAXONE >=64 RESISTANT Resistant     CIPROFLOXACIN >=4 RESISTANT Resistant     GENTAMICIN <=1 SENSITIVE Sensitive     IMIPENEM <=0.25 SENSITIVE Sensitive     TRIMETH/SULFA >=320 RESISTANT Resistant     AMPICILLIN/SULBACTAM >=32 RESISTANT Resistant     PIP/TAZO >=128 RESISTANT Resistant ug/mL    * ABUNDANT ESCHERICHIA COLI    Labs: CBC: Recent Labs  Lab 07/18/23 0250 07/19/23 0314 07/20/23 0327 07/20/23 1125 07/21/23 0312  WBC 7.9 7.4 7.5 8.7  8.1  HGB 7.9* 8.0* 6.5* 6.6* 7.2*  HCT 23.3* 23.7* 19.8* 19.7* 21.4*  MCV 122.0* 122.8* 123.8* 122.4* 121.6*  PLT 95* 94* 83* 91* 102*   Basic Metabolic Panel: Recent Labs  Lab 07/17/23 0324 07/18/23 0250 07/19/23 0314 07/20/23 0327 07/21/23 0312  NA 134* 133* 134* 133* 134*  K 5.2* 4.2 4.1 4.1 4.8  CL 86* 90* 91* 91* 91*  CO2 33* 30 32 29 31  GLUCOSE 95 92 114* 104* 97  BUN 77* 40* 61* 80* 92*  CREATININE 10.16* 6.52* 8.16* 10.34* 11.67*  CALCIUM  9.6 8.9 9.1 9.1 9.5  PHOS 7.5* 4.9* 4.2 3.9 3.4   Liver Function Tests: Recent Labs  Lab 07/17/23 0324 07/18/23 0250 07/19/23 0314 07/20/23 0327 07/21/23 0312  ALBUMIN  2.8* 2.8* 2.8* 3.5 3.3*   CBG: Recent Labs  Lab 07/19/23 1639  GLUCAP 96    Discharge time spent: greater than 30 minutes.  Signed: Owen DELENA Lore, MD [facility] 07/21/2023

## 2023-07-21 NOTE — Progress Notes (Signed)
 PT Cancellation Note  Patient Details Name: Joshua Riley MRN: [id number] DOB: 1936-01-30   Cancelled Treatment:    Reason Eval/Treat Not Completed: Patient at procedure or test/unavailable. Patient at HD this morning. Will re-attempt PT at later time/date as available.    Romanita Fager 07/21/2023, 9:03 AM

## 2023-07-21 NOTE — Progress Notes (Signed)
 Progress Note  Patient Name: Joshua Riley Date of Encounter: 07/21/2023  Primary Cardiologist:   Lynwood Schilling, MD   Subjective   No chest pain.  No SOB.   Inpatient Medications    Scheduled Meds:  (feeding supplement) PROSource Plus  30 mL Oral BID BM   sodium chloride    Intravenous Once   sodium chloride    Intravenous Once   acyclovir  ointment   Topical Q3H   Chlorhexidine  Gluconate Cloth  6 each Topical Q0600   darbepoetin (ARANESP ) injection - DIALYSIS  100 mcg Subcutaneous Q Tue-1800   dorzolamide   1 drop Both Eyes BID   ferrous sulfate   325 mg Oral Q breakfast   fluconazole   100 mg Oral Daily   folic acid   1 mg Oral Daily   Gerhardt's butt cream   Topical BID   latanoprost   1 drop Right Eye QHS   magnesium  oxide  400 mg Oral BID   midodrine   10 mg Oral TID WC   mometasone -formoterol   2 puff Inhalation BID   nystatin    Topical TID   pantoprazole   40 mg Oral BID   sertraline   25 mg Oral QHS   sevelamer  carbonate  2.4 g Oral TID WC   sodium chloride  flush  5 mL Intracatheter Q8H   sodium zirconium cyclosilicate   10 g Oral Daily   timolol   1 drop Both Eyes Daily   Continuous Infusions:  meropenem  (MERREM ) IV 1 g (07/20/23 2047)   sodium chloride      PRN Meds: acetaminophen  **OR** acetaminophen , albuterol , alum & mag hydroxide-simeth, heparin , melatonin, ondansetron  (ZOFRAN ) IV, mouth rinse, oxyCODONE -acetaminophen , traMADol    Vital Signs    Vitals:   07/21/23 0001 07/21/23 0500 07/21/23 0520 07/21/23 0541  BP: (!) 115/51  (!) 127/52   Pulse: 60     Resp: 19 15 15  (!) 24  Temp: 97.7 F (36.5 C)  97.7 F (36.5 C)   TempSrc: Oral  Oral   SpO2: 95%  96%   Weight:    70.9 kg  Height:        Intake/Output Summary (Last 24 hours) at 07/21/2023 0758 Last data filed at 07/21/2023 0700 Gross per 24 hour  Intake 150 ml  Output 552 ml  Net -402 ml   Filed Weights   07/18/23 0408 07/20/23 0133 07/21/23 0541  Weight: 68.9 kg 70.8 kg 70.9 kg     Telemetry    NSR, PACs - Personally Reviewed  ECG    NA - Personally Reviewed  Physical Exam   GEN: No acute distress.   Neck: No  JVD Cardiac: RRR, no murmurs, rubs, or gallops.  Respiratory: Clear  to auscultation bilaterally. GI: Soft, nontender, non-distended  MS: No  edema; No deformity. Neuro:  Nonfocal  Psych: Normal affect   Labs    Chemistry Recent Labs  Lab 07/19/23 0314 07/20/23 0327 07/21/23 0312  NA 134* 133* 134*  K 4.1 4.1 4.8  CL 91* 91* 91*  CO2 32 29 31  GLUCOSE 114* 104* 97  BUN 61* 80* 92*  CREATININE 8.16* 10.34* 11.67*  CALCIUM  9.1 9.1 9.5  ALBUMIN  2.8* 3.5 3.3*  GFRNONAA 6* 4* 4*  ANIONGAP 11 13 12      Hematology Recent Labs  Lab 07/20/23 0327 07/20/23 1125 07/21/23 0312  WBC 7.5 8.7 8.1  RBC 1.60* 1.61* 1.76*  HGB 6.5* 6.6* 7.2*  HCT 19.8* 19.7* 21.4*  MCV 123.8* 122.4* 121.6*  MCH 40.6* 41.0* 40.9*  MCHC 32.8 33.5 33.6  RDW 14.3 14.3 14.2  PLT 83* 91* 102*    Cardiac EnzymesNo results for input(s): TROPONINI in the last 168 hours. No results for input(s): TROPIPOC in the last 168 hours.   BNPNo results for input(s): BNP, PROBNP in the last 168 hours.   DDimer No results for input(s): DDIMER in the last 168 hours.   Radiology    No results found.  Cardiac Studies   Echo:  05/27/22  1. Left ventricular ejection fraction, by estimation, is 60 to 65%. The  left ventricle has normal function. The left ventricle has no regional  wall motion abnormalities. There is mild left ventricular hypertrophy.  Left ventricular diastolic parameters  are consistent with Grade I diastolic dysfunction (impaired relaxation).  Elevated left atrial pressure. The average left ventricular global  longitudinal strain is -19.4 %. The global longitudinal strain is normal.   2. Right ventricular systolic function is normal. The right ventricular  size is normal. There is normal pulmonary artery systolic pressure.   3. The  mitral valve is normal in structure. Trivial mitral valve  regurgitation. No evidence of mitral stenosis.   4. The aortic valve is normal in structure. Aortic valve regurgitation is  mild. No aortic stenosis is present.   5. Aortic dilatation noted. There is borderline dilatation of the aortic  root, measuring 38 mm. There is mild dilatation of the ascending aorta,  measuring 42 mm.   6. The inferior vena cava is normal in size with greater than 50%  respiratory variability, suggesting right atrial pressure of 3 mmHg.    Cardiac cath:  Diagnostic Dominance: Right   Patient Profile     88 y.o. male  with a hx of ESRD MWF chronic hypotension on midodrine , dialysis through left thigh AV graft, PAF, UC s/p proctocolectomy with ileostomy in 2011 complicated by ureteral injury now with chronic left nephrostomy tube, hx of DVT on eliquis  who is being seen 07/20/2023 for the evaluation of Afib RVR and sinus pauses at the request of Dr. Madelyne.   Assessment & Plan    PAF:    Stopped amiodarone  secondary to bradycardia and 2 second pauses. No change in therapy.  No further suggestions.  Continue to monitor.    Acute on chronic anemia:   Being transfused.  If no active bleeding he needs to be back on his DOAC.   ESRD:    Dialysis today.    For questions or updates, please contact [facility] Please consult [email or web address] for contact info under Cardiology/STEMI.   Signed, Lynwood Schilling, MD  07/21/2023, 7:58 AM

## 2023-07-21 NOTE — Procedures (Signed)
 I was present at this dialysis session. I have reviewed the session itself and made appropriate changes.   HD yesterday postponed to now 2/2 high census.  This AM K of 4.8 on 2 K bath.  UF goal of 1 L.  BPs are stable, on RA.    He says only doing 2.5h of HD today.    Hb up to 7.2 was 6.5 yesterday.  Did not rec pRBC yesterday.    As an inpatient next HD is on 1/16.   Return to MWF upon discharge.    Filed Weights   07/20/23 0133 07/21/23 0541 07/21/23 0846  Weight: 70.8 kg 70.9 kg 69.2 kg    Recent Labs  Lab 07/21/23 0312  NA 134*  K 4.8  CL 91*  CO2 31  GLUCOSE 97  BUN 92*  CREATININE 11.67*  CALCIUM  9.5  PHOS 3.4    Recent Labs  Lab 07/20/23 0327 07/20/23 1125 07/21/23 0312  WBC 7.5 8.7 8.1  HGB 6.5* 6.6* 7.2*  HCT 19.8* 19.7* 21.4*  MCV 123.8* 122.4* 121.6*  PLT 83* 91* 102*    Scheduled Meds:  (feeding supplement) PROSource Plus  30 mL Oral BID BM   sodium chloride    Intravenous Once   sodium chloride    Intravenous Once   acyclovir  ointment   Topical Q3H   Chlorhexidine  Gluconate Cloth  6 each Topical Q0600   darbepoetin (ARANESP ) injection - DIALYSIS  100 mcg Subcutaneous Q Tue-1800   dorzolamide   1 drop Both Eyes BID   ferrous sulfate   325 mg Oral Q breakfast   fluconazole   100 mg Oral Daily   folic acid   1 mg Oral Daily   Gerhardt's butt cream   Topical BID   latanoprost   1 drop Right Eye QHS   magnesium  oxide  400 mg Oral BID   midodrine   10 mg Oral TID WC   mometasone -formoterol   2 puff Inhalation BID   nystatin    Topical TID   pantoprazole   40 mg Oral BID   sertraline   25 mg Oral QHS   sevelamer  carbonate  2.4 g Oral TID WC   sodium chloride  flush  5 mL Intracatheter Q8H   sodium zirconium cyclosilicate   10 g Oral Daily   timolol   1 drop Both Eyes Daily   Continuous Infusions:  meropenem  (MERREM ) IV 1 g (07/20/23 2047)   sodium chloride      PRN Meds:.acetaminophen  **OR** acetaminophen , albuterol , alum & mag hydroxide-simeth, heparin ,  melatonin, ondansetron  (ZOFRAN ) IV, mouth rinse, oxyCODONE -acetaminophen , traMADol    Bernardino Gasman  MD 07/21/2023, 9:40 AM

## 2023-07-21 NOTE — Progress Notes (Deleted)
   Patient Name: Joshua Riley Date of Encounter: 07/21/2023 [facility] Cardiologist: Lynwood Schilling, MD   Interval Summary  .    42 yr olf male with PMH of ESRD on MWF hemodialysis via left thigh AV graft, chronic hypotension on midodrine , paroxysmal A-fib, obstructive uropathy chronic left percutaneous nephrostomy , ulcerative colitis s/p colectomy with ileostomy in 2011 complicated by ureteral injury as well as pouch excision close of perineal wound 2012, prior SBO, enterocutaneous fistula, DVT on Eliquis , who is admitted since 07/07/23 for sepsis 2/2 groin abscess and pre-sacral abscess. Cardiology consulted 07/20/23 for A fib RVR and sinus pauses/bradycardia.   Patient states he is doing well, denied any chest pain, heart palpitation, SOB, dizziness.   Vital Signs .    Vitals:   07/21/23 0541 07/21/23 0846 07/21/23 0914 07/21/23 0930  BP:  (!) 118/94 (!) 115/59 (!) 114/53  Pulse:  97 (!) 48 (!) 51  Resp: (!) 24 14 (!) 23 (!) 26  Temp:  (!) 97.5 F (36.4 C)    TempSrc:      SpO2:  100% 100% 100%  Weight: 70.9 kg 69.2 kg    Height:        Intake/Output Summary (Last 24 hours) at 07/21/2023 0935 Last data filed at 07/21/2023 0700 Gross per 24 hour  Intake 150 ml  Output 552 ml  Net -402 ml      07/21/2023    8:46 AM 07/21/2023    5:41 AM 07/20/2023    1:33 AM  Last 3 Weights  Weight (lbs) 152 lb 8.9 oz 156 lb 4.9 oz 156 lb 1.4 oz  Weight (kg) 69.2 kg 70.9 kg 70.8 kg      Telemetry/ECG    Sinus rhythm with PACs, rate of 60-70s today - Personally Reviewed  Physical Exam .   GEN: No acute distress.   Neck: No JVD Cardiac: Irregular rhythm, grade III systolic murmur LUSB Respiratory: Clear to auscultation bilaterally. On room air  GI: Soft, nontender, non-distended  MS: No edema Receiving dialysis currently   Assessment & Plan .     Paroxysmal A fib with RVR Sinus pauses and sinus bradycardia  - admitted for sepsis due to groin and pre-sacral abscess,  A fib RVR on 1/10, was started on amiodarone  by primary team for rate control and transitioned to PO amiodarone  200mg  daily due to bradycardia and pauses. Cardiology consulted 1/13 due to sinus pauses 2-3 seconds and sinus bradycardia 50s without symptoms  - recommend stop amiodarone  since 1/13 - if A fib recur, can use low dose metoprolol  for rate control, historically does not require any AVN blocking agents; keep Mag >2 and K >4; keep Hgb >7  - PTA Eliquis  held due to anemia with Hgb down to 6.5 yesterday, defer bleeding workup to primary team, if negative and Hgb improves, may resume DOAC, no bridge needed   Groin and pre-sacral abscess Acute on chronic anemia ESRD Hypotension  Hx of DVT  Ulcerative colitis  - per IM        For questions or updates, please contact [facility] Please consult [email or web address] for contact info under        Signed, Rooney Gladwin, NP

## 2023-07-22 ENCOUNTER — Other Ambulatory Visit: Payer: Self-pay | Admitting: Primary Care

## 2023-07-22 LAB — COMPREHENSIVE METABOLIC PANEL
ALT: 13 U/L (ref 0–44)
AST: 39 U/L (ref 15–41)
Albumin: 3.1 g/dL — ABNORMAL LOW (ref 3.5–5.0)
Alkaline Phosphatase: 116 U/L (ref 38–126)
Anion gap: 10 (ref 5–15)
BUN: 44 mg/dL — ABNORMAL HIGH (ref 8–23)
CO2: 30 mmol/L (ref 22–32)
Calcium: 9 mg/dL (ref 8.9–10.3)
Chloride: 94 mmol/L — ABNORMAL LOW (ref 98–111)
Creatinine, Ser: 7.54 mg/dL — ABNORMAL HIGH (ref 0.61–1.24)
GFR, Estimated: 7 mL/min — ABNORMAL LOW (ref >60)
Glucose, Bld: 114 mg/dL — ABNORMAL HIGH (ref 70–99)
Potassium: 4 mmol/L (ref 3.5–5.1)
Sodium: 134 mmol/L — ABNORMAL LOW (ref 135–145)
Total Bilirubin: 0.9 mg/dL (ref 0.0–1.2)
Total Protein: 5.9 g/dL — ABNORMAL LOW (ref 6.5–8.1)

## 2023-07-22 LAB — CBC
HCT: 25.6 % — ABNORMAL LOW (ref 39.0–52.0)
Hemoglobin: 8.6 g/dL — ABNORMAL LOW (ref 13.0–17.0)
MCH: 37.9 pg — ABNORMAL HIGH (ref 26.0–34.0)
MCHC: 33.6 g/dL (ref 30.0–36.0)
MCV: 112.8 fL — ABNORMAL HIGH (ref 80.0–100.0)
Platelets: 144 10*3/uL — ABNORMAL LOW (ref 150–400)
RBC: 2.27 MIL/uL — ABNORMAL LOW (ref 4.22–5.81)
RDW: 22.5 % — ABNORMAL HIGH (ref 11.5–15.5)
WBC: 8.6 10*3/uL (ref 4.0–10.5)
nRBC: 0 % (ref 0.0–0.2)

## 2023-07-22 LAB — C DIFFICILE QUICK SCREEN W PCR REFLEX
C Diff antigen: NEGATIVE
C Diff interpretation: NOT DETECTED
C Diff toxin: NEGATIVE

## 2023-07-23 LAB — CBC WITH DIFFERENTIAL/PLATELET
Abs Immature Granulocytes: 0.33 10*3/uL — ABNORMAL HIGH (ref 0.00–0.07)
Basophils Absolute: 0.1 10*3/uL (ref 0.0–0.1)
Basophils Relative: 1 %
Eosinophils Absolute: 0.2 10*3/uL (ref 0.0–0.5)
Eosinophils Relative: 2 %
HCT: 25 % — ABNORMAL LOW (ref 39.0–52.0)
Hemoglobin: 8.5 g/dL — ABNORMAL LOW (ref 13.0–17.0)
Immature Granulocytes: 4 %
Lymphocytes Relative: 11 %
Lymphs Abs: 1 10*3/uL (ref 0.7–4.0)
MCH: 38.5 pg — ABNORMAL HIGH (ref 26.0–34.0)
MCHC: 34 g/dL (ref 30.0–36.0)
MCV: 113.1 fL — ABNORMAL HIGH (ref 80.0–100.0)
Monocytes Absolute: 1.1 10*3/uL — ABNORMAL HIGH (ref 0.1–1.0)
Monocytes Relative: 12 %
Neutro Abs: 6.1 10*3/uL (ref 1.7–7.7)
Neutrophils Relative %: 70 %
Platelets: 158 10*3/uL (ref 150–400)
RBC: 2.21 MIL/uL — ABNORMAL LOW (ref 4.22–5.81)
RDW: 21.5 % — ABNORMAL HIGH (ref 11.5–15.5)
WBC: 8.7 10*3/uL (ref 4.0–10.5)
nRBC: 0.2 % (ref 0.0–0.2)

## 2023-07-23 LAB — RENAL FUNCTION PANEL
Albumin: 3.3 g/dL — ABNORMAL LOW (ref 3.5–5.0)
Anion gap: 11 (ref 5–15)
BUN: 62 mg/dL — ABNORMAL HIGH (ref 8–23)
CO2: 26 mmol/L (ref 22–32)
Calcium: 9.3 mg/dL (ref 8.9–10.3)
Chloride: 96 mmol/L — ABNORMAL LOW (ref 98–111)
Creatinine, Ser: 9.23 mg/dL — ABNORMAL HIGH (ref 0.61–1.24)
GFR, Estimated: 5 mL/min — ABNORMAL LOW (ref >60)
Glucose, Bld: 99 mg/dL (ref 70–99)
Phosphorus: 1.9 mg/dL — ABNORMAL LOW (ref 2.5–4.6)
Potassium: 4.9 mmol/L (ref 3.5–5.1)
Sodium: 133 mmol/L — ABNORMAL LOW (ref 135–145)

## 2023-07-24 LAB — TYPE AND SCREEN
ABO/RH(D): B NEG
Antibody Screen: NEGATIVE
Unit division: 0
Unit division: 0

## 2023-07-24 LAB — BPAM RBC
Blood Product Expiration Date: 202501162359
Blood Product Expiration Date: 202501242359
ISSUE DATE / TIME: 202501071131
ISSUE DATE / TIME: 202501140955
Unit Type and Rh: 1700
Unit Type and Rh: 1700

## 2023-07-24 LAB — LACTIC ACID, PLASMA: Lactic Acid, Venous: 1.3 mmol/L (ref 0.5–1.9)

## 2023-07-25 ENCOUNTER — Other Ambulatory Visit: Payer: Self-pay | Admitting: Internal Medicine

## 2023-07-25 DIAGNOSIS — F32A Depression, unspecified: Secondary | ICD-10-CM

## 2023-07-25 LAB — FERRITIN: Ferritin: 1518 ng/mL — ABNORMAL HIGH (ref 24–336)

## 2023-07-25 LAB — RENAL FUNCTION PANEL
Albumin: 3.2 g/dL — ABNORMAL LOW (ref 3.5–5.0)
Anion gap: 9 (ref 5–15)
BUN: 50 mg/dL — ABNORMAL HIGH (ref 8–23)
CO2: 27 mmol/L (ref 22–32)
Calcium: 9.8 mg/dL (ref 8.9–10.3)
Chloride: 99 mmol/L (ref 98–111)
Creatinine, Ser: 7.54 mg/dL — ABNORMAL HIGH (ref 0.61–1.24)
GFR, Estimated: 7 mL/min — ABNORMAL LOW (ref >60)
Glucose, Bld: 106 mg/dL — ABNORMAL HIGH (ref 70–99)
Phosphorus: <1 mg/dL — CL (ref 2.5–4.6)
Potassium: 4.7 mmol/L (ref 3.5–5.1)
Sodium: 135 mmol/L (ref 135–145)

## 2023-07-25 LAB — IRON AND TIBC
Iron: 144 ug/dL (ref 45–182)
Saturation Ratios: 41 % — ABNORMAL HIGH (ref 17.9–39.5)
TIBC: 349 ug/dL (ref 250–450)
UIBC: 205 ug/dL

## 2023-07-25 LAB — CBC WITH DIFFERENTIAL/PLATELET
Abs Immature Granulocytes: 0.14 10*3/uL — ABNORMAL HIGH (ref 0.00–0.07)
Basophils Absolute: 0 10*3/uL (ref 0.0–0.1)
Basophils Relative: 0 %
Eosinophils Absolute: 0.2 10*3/uL (ref 0.0–0.5)
Eosinophils Relative: 3 %
HCT: 23.4 % — ABNORMAL LOW (ref 39.0–52.0)
Hemoglobin: 7.7 g/dL — ABNORMAL LOW (ref 13.0–17.0)
Immature Granulocytes: 2 %
Lymphocytes Relative: 10 %
Lymphs Abs: 0.9 10*3/uL (ref 0.7–4.0)
MCH: 39.1 pg — ABNORMAL HIGH (ref 26.0–34.0)
MCHC: 32.9 g/dL (ref 30.0–36.0)
MCV: 118.8 fL — ABNORMAL HIGH (ref 80.0–100.0)
Monocytes Absolute: 1.1 10*3/uL — ABNORMAL HIGH (ref 0.1–1.0)
Monocytes Relative: 12 %
Neutro Abs: 6.7 10*3/uL (ref 1.7–7.7)
Neutrophils Relative %: 73 %
Platelets: 146 10*3/uL — ABNORMAL LOW (ref 150–400)
RBC: 1.97 MIL/uL — ABNORMAL LOW (ref 4.22–5.81)
RDW: 21.1 % — ABNORMAL HIGH (ref 11.5–15.5)
WBC: 9.1 10*3/uL (ref 4.0–10.5)
nRBC: 0 % (ref 0.0–0.2)

## 2023-07-25 LAB — CORTISOL: Cortisol, Plasma: 7.3 ug/dL

## 2023-07-25 LAB — T4, FREE: Free T4: 0.75 ng/dL (ref 0.61–1.12)

## 2023-07-25 LAB — MAGNESIUM: Magnesium: 1.9 mg/dL (ref 1.7–2.4)

## 2023-07-25 LAB — TSH: TSH: 4.875 u[IU]/mL — ABNORMAL HIGH (ref 0.350–4.500)

## 2023-07-27 LAB — HEMOGLOBIN AND HEMATOCRIT, BLOOD
HCT: 27.6 % — ABNORMAL LOW (ref 39.0–52.0)
Hemoglobin: 9.1 g/dL — ABNORMAL LOW (ref 13.0–17.0)

## 2023-07-27 LAB — PREPARE RBC (CROSSMATCH)

## 2023-07-28 LAB — CBC WITH DIFFERENTIAL/PLATELET
Abs Immature Granulocytes: 0.09 10*3/uL — ABNORMAL HIGH (ref 0.00–0.07)
Basophils Absolute: 0 10*3/uL (ref 0.0–0.1)
Basophils Relative: 0 %
Eosinophils Absolute: 0.3 10*3/uL (ref 0.0–0.5)
Eosinophils Relative: 3 %
HCT: 27.8 % — ABNORMAL LOW (ref 39.0–52.0)
Hemoglobin: 9.3 g/dL — ABNORMAL LOW (ref 13.0–17.0)
Immature Granulocytes: 1 %
Lymphocytes Relative: 9 %
Lymphs Abs: 0.9 10*3/uL (ref 0.7–4.0)
MCH: 37.1 pg — ABNORMAL HIGH (ref 26.0–34.0)
MCHC: 33.5 g/dL (ref 30.0–36.0)
MCV: 110.8 fL — ABNORMAL HIGH (ref 80.0–100.0)
Monocytes Absolute: 1.1 10*3/uL — ABNORMAL HIGH (ref 0.1–1.0)
Monocytes Relative: 11 %
Neutro Abs: 7.2 10*3/uL (ref 1.7–7.7)
Neutrophils Relative %: 76 %
Platelets: 125 10*3/uL — ABNORMAL LOW (ref 150–400)
RBC: 2.51 MIL/uL — ABNORMAL LOW (ref 4.22–5.81)
RDW: 24.2 % — ABNORMAL HIGH (ref 11.5–15.5)
WBC: 9.5 10*3/uL (ref 4.0–10.5)
nRBC: 0.2 % (ref 0.0–0.2)

## 2023-07-28 LAB — RENAL FUNCTION PANEL
Albumin: 3.3 g/dL — ABNORMAL LOW (ref 3.5–5.0)
Albumin: 3.1 g/dL — ABNORMAL LOW (ref 3.5–5.0)
Anion gap: 12 (ref 5–15)
Anion gap: 9 (ref 5–15)
BUN: 56 mg/dL — ABNORMAL HIGH (ref 8–23)
BUN: 21 mg/dL (ref 8–23)
CO2: 22 mmol/L (ref 22–32)
CO2: 29 mmol/L (ref 22–32)
Calcium: 9.3 mg/dL (ref 8.9–10.3)
Calcium: 8.9 mg/dL (ref 8.9–10.3)
Chloride: 98 mmol/L (ref 98–111)
Chloride: 100 mmol/L (ref 98–111)
Creatinine, Ser: 7.21 mg/dL — ABNORMAL HIGH (ref 0.61–1.24)
Creatinine, Ser: 4.34 mg/dL — ABNORMAL HIGH (ref 0.61–1.24)
GFR, Estimated: 7 mL/min — ABNORMAL LOW (ref >60)
GFR, Estimated: 13 mL/min — ABNORMAL LOW (ref >60)
Glucose, Bld: 130 mg/dL — ABNORMAL HIGH (ref 70–99)
Glucose, Bld: 130 mg/dL — ABNORMAL HIGH (ref 70–99)
Phosphorus: <1 mg/dL — CL (ref 2.5–4.6)
Phosphorus: 1.5 mg/dL — ABNORMAL LOW (ref 2.5–4.6)
Potassium: 6.2 mmol/L — ABNORMAL HIGH (ref 3.5–5.1)
Potassium: 5.2 mmol/L — ABNORMAL HIGH (ref 3.5–5.1)
Sodium: 132 mmol/L — ABNORMAL LOW (ref 135–145)
Sodium: 138 mmol/L (ref 135–145)

## 2023-07-28 LAB — TYPE AND SCREEN
ABO/RH(D): B NEG
Antibody Screen: NEGATIVE
Unit division: 0

## 2023-07-28 LAB — BPAM RBC
Blood Product Expiration Date: 202502172359
ISSUE DATE / TIME: 202501201334
Unit Type and Rh: 1700

## 2023-07-28 LAB — MAGNESIUM: Magnesium: 1.9 mg/dL (ref 1.7–2.4)

## 2023-07-29 LAB — RENAL FUNCTION PANEL
Albumin: 3.3 g/dL — ABNORMAL LOW (ref 3.5–5.0)
Anion gap: 15 (ref 5–15)
BUN: 43 mg/dL — ABNORMAL HIGH (ref 8–23)
CO2: 27 mmol/L (ref 22–32)
Calcium: 8.3 mg/dL — ABNORMAL LOW (ref 8.9–10.3)
Chloride: 95 mmol/L — ABNORMAL LOW (ref 98–111)
Creatinine, Ser: 6.18 mg/dL — ABNORMAL HIGH (ref 0.61–1.24)
GFR, Estimated: 8 mL/min — ABNORMAL LOW (ref >60)
Glucose, Bld: 128 mg/dL — ABNORMAL HIGH (ref 70–99)
Phosphorus: 4.4 mg/dL (ref 2.5–4.6)
Potassium: 5.3 mmol/L — ABNORMAL HIGH (ref 3.5–5.1)
Sodium: 137 mmol/L (ref 135–145)

## 2023-07-30 LAB — CBC WITH DIFFERENTIAL/PLATELET
Abs Immature Granulocytes: 0.05 10*3/uL (ref 0.00–0.07)
Basophils Absolute: 0 10*3/uL (ref 0.0–0.1)
Basophils Relative: 0 %
Eosinophils Absolute: 0.3 10*3/uL (ref 0.0–0.5)
Eosinophils Relative: 3 %
HCT: 26.4 % — ABNORMAL LOW (ref 39.0–52.0)
Hemoglobin: 8.6 g/dL — ABNORMAL LOW (ref 13.0–17.0)
Immature Granulocytes: 1 %
Lymphocytes Relative: 9 %
Lymphs Abs: 0.8 10*3/uL (ref 0.7–4.0)
MCH: 36.9 pg — ABNORMAL HIGH (ref 26.0–34.0)
MCHC: 32.6 g/dL (ref 30.0–36.0)
MCV: 113.3 fL — ABNORMAL HIGH (ref 80.0–100.0)
Monocytes Absolute: 1.1 10*3/uL — ABNORMAL HIGH (ref 0.1–1.0)
Monocytes Relative: 12 %
Neutro Abs: 7 10*3/uL (ref 1.7–7.7)
Neutrophils Relative %: 75 %
Platelets: 146 10*3/uL — ABNORMAL LOW (ref 150–400)
RBC: 2.33 MIL/uL — ABNORMAL LOW (ref 4.22–5.81)
RDW: 23.1 % — ABNORMAL HIGH (ref 11.5–15.5)
Smear Review: NORMAL
WBC: 9.4 10*3/uL (ref 4.0–10.5)
nRBC: 0 % (ref 0.0–0.2)

## 2023-07-30 LAB — RENAL FUNCTION PANEL
Albumin: 3 g/dL — ABNORMAL LOW (ref 3.5–5.0)
Anion gap: 13 (ref 5–15)
BUN: 59 mg/dL — ABNORMAL HIGH (ref 8–23)
CO2: 26 mmol/L (ref 22–32)
Calcium: 8.3 mg/dL — ABNORMAL LOW (ref 8.9–10.3)
Chloride: 97 mmol/L — ABNORMAL LOW (ref 98–111)
Creatinine, Ser: 7.43 mg/dL — ABNORMAL HIGH (ref 0.61–1.24)
GFR, Estimated: 7 mL/min — ABNORMAL LOW (ref >60)
Glucose, Bld: 106 mg/dL — ABNORMAL HIGH (ref 70–99)
Phosphorus: 3.3 mg/dL (ref 2.5–4.6)
Potassium: 5.4 mmol/L — ABNORMAL HIGH (ref 3.5–5.1)
Sodium: 136 mmol/L (ref 135–145)

## 2023-07-31 ENCOUNTER — Other Ambulatory Visit (HOSPITAL_COMMUNITY): Payer: Medicare Other

## 2023-07-31 ENCOUNTER — Encounter: Payer: Self-pay | Admitting: Family Medicine

## 2023-07-31 LAB — AEROBIC/ANAEROBIC CULTURE W GRAM STAIN (SURGICAL/DEEP WOUND): Special Requests: NORMAL

## 2023-07-31 LAB — MISC LABCORP TEST (SEND OUT): Labcorp test code: 9985

## 2023-07-31 LAB — POTASSIUM: Potassium: 5.2 mmol/L — ABNORMAL HIGH (ref 3.5–5.1)

## 2023-08-01 LAB — CBC WITH DIFFERENTIAL/PLATELET
Abs Immature Granulocytes: 0.06 10*3/uL (ref 0.00–0.07)
Basophils Absolute: 0 10*3/uL (ref 0.0–0.1)
Basophils Relative: 1 %
Eosinophils Absolute: 0.4 10*3/uL (ref 0.0–0.5)
Eosinophils Relative: 5 %
HCT: 27.8 % — ABNORMAL LOW (ref 39.0–52.0)
Hemoglobin: 8.8 g/dL — ABNORMAL LOW (ref 13.0–17.0)
Immature Granulocytes: 1 %
Lymphocytes Relative: 14 %
Lymphs Abs: 1 10*3/uL (ref 0.7–4.0)
MCH: 36.5 pg — ABNORMAL HIGH (ref 26.0–34.0)
MCHC: 31.7 g/dL (ref 30.0–36.0)
MCV: 115.4 fL — ABNORMAL HIGH (ref 80.0–100.0)
Monocytes Absolute: 1.2 10*3/uL — ABNORMAL HIGH (ref 0.1–1.0)
Monocytes Relative: 15 %
Neutro Abs: 5 10*3/uL (ref 1.7–7.7)
Neutrophils Relative %: 64 %
Platelets: 175 10*3/uL (ref 150–400)
RBC: 2.41 MIL/uL — ABNORMAL LOW (ref 4.22–5.81)
RDW: 22.9 % — ABNORMAL HIGH (ref 11.5–15.5)
WBC: 7.7 10*3/uL (ref 4.0–10.5)
nRBC: 0 % (ref 0.0–0.2)

## 2023-08-01 LAB — RENAL FUNCTION PANEL
Albumin: 3.1 g/dL — ABNORMAL LOW (ref 3.5–5.0)
Anion gap: 12 (ref 5–15)
BUN: 55 mg/dL — ABNORMAL HIGH (ref 8–23)
CO2: 24 mmol/L (ref 22–32)
Calcium: 8.8 mg/dL — ABNORMAL LOW (ref 8.9–10.3)
Chloride: 99 mmol/L (ref 98–111)
Creatinine, Ser: 6.99 mg/dL — ABNORMAL HIGH (ref 0.61–1.24)
GFR, Estimated: 7 mL/min — ABNORMAL LOW (ref >60)
Glucose, Bld: 93 mg/dL (ref 70–99)
Phosphorus: 2.8 mg/dL (ref 2.5–4.6)
Potassium: 5.1 mmol/L (ref 3.5–5.1)
Sodium: 135 mmol/L (ref 135–145)

## 2023-08-04 ENCOUNTER — Other Ambulatory Visit (HOSPITAL_COMMUNITY): Payer: Medicare Other

## 2023-08-04 LAB — CBC WITH DIFFERENTIAL/PLATELET
Abs Immature Granulocytes: 0.08 10*3/uL — ABNORMAL HIGH (ref 0.00–0.07)
Basophils Absolute: 0 10*3/uL (ref 0.0–0.1)
Basophils Relative: 1 %
Eosinophils Absolute: 0.4 10*3/uL (ref 0.0–0.5)
Eosinophils Relative: 7 %
HCT: 29.8 % — ABNORMAL LOW (ref 39.0–52.0)
Hemoglobin: 9.6 g/dL — ABNORMAL LOW (ref 13.0–17.0)
Immature Granulocytes: 1 %
Lymphocytes Relative: 17 %
Lymphs Abs: 1 10*3/uL (ref 0.7–4.0)
MCH: 37.2 pg — ABNORMAL HIGH (ref 26.0–34.0)
MCHC: 32.2 g/dL (ref 30.0–36.0)
MCV: 115.5 fL — ABNORMAL HIGH (ref 80.0–100.0)
Monocytes Absolute: 0.8 10*3/uL (ref 0.1–1.0)
Monocytes Relative: 13 %
Neutro Abs: 3.7 10*3/uL (ref 1.7–7.7)
Neutrophils Relative %: 61 %
Platelets: 200 10*3/uL (ref 150–400)
RBC: 2.58 MIL/uL — ABNORMAL LOW (ref 4.22–5.81)
RDW: 22.5 % — ABNORMAL HIGH (ref 11.5–15.5)
WBC: 6 10*3/uL (ref 4.0–10.5)
nRBC: 0 % (ref 0.0–0.2)

## 2023-08-04 LAB — RENAL FUNCTION PANEL
Albumin: 3.4 g/dL — ABNORMAL LOW (ref 3.5–5.0)
Anion gap: 13 (ref 5–15)
BUN: 74 mg/dL — ABNORMAL HIGH (ref 8–23)
CO2: 22 mmol/L (ref 22–32)
Calcium: 9.9 mg/dL (ref 8.9–10.3)
Chloride: 102 mmol/L (ref 98–111)
Creatinine, Ser: 9.6 mg/dL — ABNORMAL HIGH (ref 0.61–1.24)
GFR, Estimated: 5 mL/min — ABNORMAL LOW (ref >60)
Glucose, Bld: 113 mg/dL — ABNORMAL HIGH (ref 70–99)
Phosphorus: 2.3 mg/dL — ABNORMAL LOW (ref 2.5–4.6)
Potassium: 6.2 mmol/L — ABNORMAL HIGH (ref 3.5–5.1)
Sodium: 137 mmol/L (ref 135–145)

## 2023-08-06 ENCOUNTER — Inpatient Hospital Stay (HOSPITAL_COMMUNITY)
Admission: RE | Admit: 2023-08-06 | Discharge: 2023-08-06 | Disposition: A | Discharge status: Home / Self Care | Payer: Medicare Other | Source: Ambulatory Visit | Attending: Interventional Radiology | Admitting: Interventional Radiology

## 2023-08-06 LAB — CBC WITH DIFFERENTIAL/PLATELET
Abs Immature Granulocytes: 0.13 10*3/uL — ABNORMAL HIGH (ref 0.00–0.07)
Basophils Absolute: 0.1 10*3/uL (ref 0.0–0.1)
Basophils Relative: 1 %
Eosinophils Absolute: 0.3 10*3/uL (ref 0.0–0.5)
Eosinophils Relative: 5 %
HCT: 29.2 % — ABNORMAL LOW (ref 39.0–52.0)
Hemoglobin: 9.2 g/dL — ABNORMAL LOW (ref 13.0–17.0)
Immature Granulocytes: 2 %
Lymphocytes Relative: 15 %
Lymphs Abs: 1 10*3/uL (ref 0.7–4.0)
MCH: 37.2 pg — ABNORMAL HIGH (ref 26.0–34.0)
MCHC: 31.5 g/dL (ref 30.0–36.0)
MCV: 118.2 fL — ABNORMAL HIGH (ref 80.0–100.0)
Monocytes Absolute: 1.1 10*3/uL — ABNORMAL HIGH (ref 0.1–1.0)
Monocytes Relative: 17 %
Neutro Abs: 3.8 10*3/uL (ref 1.7–7.7)
Neutrophils Relative %: 60 %
Platelets: 169 10*3/uL (ref 150–400)
RBC: 2.47 MIL/uL — ABNORMAL LOW (ref 4.22–5.81)
RDW: 21.9 % — ABNORMAL HIGH (ref 11.5–15.5)
WBC: 6.4 10*3/uL (ref 4.0–10.5)
nRBC: 1.3 % — ABNORMAL HIGH (ref 0.0–0.2)

## 2023-08-06 LAB — RENAL FUNCTION PANEL
Albumin: 3.3 g/dL — ABNORMAL LOW (ref 3.5–5.0)
Anion gap: 13 (ref 5–15)
BUN: 64 mg/dL — ABNORMAL HIGH (ref 8–23)
CO2: 25 mmol/L (ref 22–32)
Calcium: 9.7 mg/dL (ref 8.9–10.3)
Chloride: 96 mmol/L — ABNORMAL LOW (ref 98–111)
Creatinine, Ser: 7.66 mg/dL — ABNORMAL HIGH (ref 0.61–1.24)
GFR, Estimated: 6 mL/min — ABNORMAL LOW (ref >60)
Glucose, Bld: 93 mg/dL (ref 70–99)
Phosphorus: 2.5 mg/dL (ref 2.5–4.6)
Potassium: 5.9 mmol/L — ABNORMAL HIGH (ref 3.5–5.1)
Sodium: 134 mmol/L — ABNORMAL LOW (ref 135–145)

## 2023-08-07 LAB — HEPATITIS B SURFACE ANTIGEN: Hepatitis B Surface Ag: NONREACTIVE

## 2023-08-07 LAB — HEPATITIS B SURFACE ANTIBODY,QUALITATIVE: Hep B S Ab: REACTIVE — AB

## 2023-08-07 LAB — HEPATITIS B CORE ANTIBODY, IGM: Hep B C IgM: NONREACTIVE

## 2023-08-07 LAB — HEPATITIS A ANTIBODY, IGM: Hep A IgM: NONREACTIVE

## 2023-08-07 LAB — HEPATITIS C ANTIBODY: HCV Ab: NONREACTIVE

## 2023-08-10 ENCOUNTER — Other Ambulatory Visit: Payer: Self-pay | Admitting: General Surgery

## 2023-08-10 DIAGNOSIS — K611 Rectal abscess: Secondary | ICD-10-CM

## 2023-08-13 ENCOUNTER — Ambulatory Visit: Payer: Medicare Other | Admitting: Family Medicine

## 2023-08-13 ENCOUNTER — Inpatient Hospital Stay: Payer: Medicare Other | Admitting: Primary Care

## 2023-08-13 ENCOUNTER — Encounter: Payer: Self-pay | Admitting: Primary Care

## 2023-08-13 NOTE — Progress Notes (Deleted)
 @Patient  ID: Joshua Riley, male    DOB: 06-Sep-1935, 88 y.o.   MRN: [id number]  No chief complaint on file.   Referring provider: Katrinka Garnette KIDD, MD  HPI:   08/13/2023 Patient presents today for hospital follow-up.  He was admitted in December for sepsis due to groin abscess.  Patient has a medical history significant for end-stage renal disease on hemodialysis Monday Wednesday Friday, chronic hypotension on midodrine , paroxysmal A-fib, ulcerative colitis status post colectomy with ileostomy in 2011, prior SBO, Antero cutaneous fistula, pelvic fluid collection dating back to July 2024.  Patient was last seen by our office in December 2024 for dyspnea on exertion.  HRCT in November 2024 showed mild parenchymal changes and scarring, indeterminant for UIP.  Pulmonary function testing showed some restriction but improvement following bronchodilator response suggestive of possible reactive airway disease.  He was started on Advair 250/50 1 puff twice daily and provided rescue inhaler to use for emergency use.  Discussed findings with Dr. Geronimo, given the fact that UIP is indeterminant patient felt likely to have lower risk of progression.  When considering his age and frailty due to multiple comorbidities plan is to treat conservatively and repeat spirometry with DLCO in 2 to 4 months to assess lung function.  Patient did not find Advair to be effective, he was changed to nebulized budesonide  0.5mg /41ml twice daily along with albuterol  q6 hours.    Patient has several comorbidities including end-stage renal disease, congestive heart failure, anemia and A-fib which are likely contributing to his shortness of breath   Pulmonary function testing  06/09/2023 PFT>> FVC 2.44 (80%), FEV1 2.10 (100%), ratio 86, TLC 74%, DLCO 17.46 (85%), positive bronchodilator response Impression: reactive airway disease, minimal restriction  Imaging: 05/01/2023 HRCT>> areas of mild coarsened subpleural  groundglass and subpleural reticulation.  Mild bronchiectasis.  Findings are indeterminant for UIP.   Allergies  Allergen Reactions   Cephalosporins Rash   Ciprofloxacin Itching and Rash   Baclofen Other (See Comments)    Altered mental status, after accidental overdose      Immunization History  Administered Date(s) Administered   Influenza, High Dose Seasonal PF 04/20/2018   Influenza, Seasonal, Injecte, Preservative Fre 05/16/2014   Influenza-Unspecified 03/17/2012, 04/15/2013, 05/16/2014, 05/09/2015, 05/07/2016, 03/24/2017, 05/31/2017, 04/10/2019   PFIZER(Purple Top)SARS-COV-2 Vaccination 07/16/2019, 08/09/2019   Pneumococcal Conjugate-13 01/09/2016, 04/06/2016   Pneumococcal Polysaccharide-23 03/17/2012, 11/10/2017   Zoster, Live 04/18/2013    Past Medical History:  Diagnosis Date   A-fib (HCC)    Anemia    Arthritis    Cancer (HCC)    Basal cell   COVID-19    2021   Dysrhythmia    Afib-controlled on eliquis    ESRD (end stage renal disease) (HCC) 10/22/2021   Glaucoma 11/18/2021   History of DVT (deep vein thrombosis)    Hydronephrosis    managed wtih a PCN   Idiopathic neuropathy 10/22/2021   lyrica     Ileostomy in place [facility])    Obstructive uropathy    With chronic left nephrostomy   Old retinal detachment, total or subtotal    Orthostatic hypotension 10/22/2021   Sleep apnea    does not need a machine   Stroke (HCC)    Ulcerative colitis (HCC)    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    Tobacco History: Social History   Tobacco Use  Smoking Status Former   Current packs/day: 0.00   Average packs/day: 2.0 packs/day for 6.0 years (12.0 ttl pk-yrs)  Types: Cigarettes   Start date: 74   Quit date: 1985   Years since quitting: 40.1   Passive exposure: Never  Smokeless Tobacco Never   Counseling given: Not Answered   Outpatient Medications Prior to Visit  Medication Sig Dispense Refill   acetaminophen  (TYLENOL ) 500 MG  tablet Take 1,000 mg by mouth as needed for mild pain (pain score 1-3).     albuterol  (PROVENTIL ) (2.5 MG/3ML) 0.083% nebulizer solution Take 2.5 mg by nebulization 2 (two) times daily.     Albuterol  Sulfate 2.5 MG/0.5ML NEBU Inhale 0.5 mLs (2.5 mg total) into the lungs every 6 (six) hours. 60 mL 5   BIOTIN PO Take 1 capsule by mouth daily.     budesonide  (PULMICORT ) 0.5 MG/2ML nebulizer solution TAKE 2 ML (0.5 MG TOTAL) BY NEBULIZATION TWICE A DAY 360 mL 1   Cyanocobalamin  (B-12) 1000 MCG SUBL Place 1 tablet under the tongue daily at 6 (six) AM. 90 tablet 3   dorzolamide  (TRUSOPT ) 2 % ophthalmic solution Place 1 drop into both eyes 2 (two) times daily.     ELIQUIS  2.5 MG TABS tablet Take 1 tablet (2.5 mg total) by mouth 2 (two) times daily. 60 tablet 11   ferrous sulfate  325 (65 FE) MG tablet Take 1 tablet (325 mg total) by mouth daily with breakfast. 30 tablet 3   fluconazole  (DIFLUCAN ) 100 MG tablet Take 1 tablet (100 mg total) by mouth daily. 4 tablet 0   folic acid  (FOLVITE ) 1 MG tablet Take 1 tablet (1 mg total) by mouth daily. 30 tablet 0   latanoprost  (XALATAN ) 0.005 % ophthalmic solution Place 1 drop into the right eye at bedtime.     magnesium  oxide (MAG-OX) 400 (240 Mg) MG tablet Take 1 tablet (400 mg total) by mouth 2 (two) times daily. 60 tablet 0   melatonin 3 MG TABS tablet Take 1 tablet (3 mg total) by mouth at bedtime as needed (insomnia). 30 tablet 0   meropenem  1 g in sodium chloride  0.9 % 100 mL Inject 1 g into the vein daily. 1 Application 0   midodrine  (PROAMATINE ) 10 MG tablet Take 1 tablet (10 mg total) by mouth 3 (three) times daily with meals. 90 tablet 0   Nystatin  (GERHARDT'S BUTT CREAM) CREA Apply 1 Application topically 2 (two) times daily. 60 each 0   nystatin  (MYCOSTATIN /NYSTOP ) powder Apply topically 3 (three) times daily. 15 g 0   Omega-3 Fatty Acids (OMEGA-3 FISH OIL) 500 MG CAPS Take 1 capsule by mouth daily.     pantoprazole  (PROTONIX ) 40 MG tablet Take 1  tablet (40 mg total) by mouth 2 (two) times daily. 60 tablet 0   ramelteon  (ROZEREM ) 8 MG tablet TAKE 1 TABLET BY MOUTH AT BEDTIME. 90 tablet 2   riboflavin (VITAMIN B-2) 100 MG TABS tablet Take 100 mg by mouth daily.     sertraline  (ZOLOFT ) 25 MG tablet TAKE 1 TABLET (25 MG TOTAL) BY MOUTH DAILY. 90 tablet 2   sevelamer  carbonate (RENVELA ) 2.4 g PACK Take 2.4 g by mouth 3 (three) times daily with meals. 90 each 0   timolol  (TIMOPTIC ) 0.5 % ophthalmic solution Place 1 drop into both eyes daily.     traMADol  (ULTRAM ) 50 MG tablet Take 1 tablet (50 mg total) by mouth every 8 (eight) hours as needed (prn pain). 30 tablet 0   WIXELA INHUB 250-50 MCG/ACT AEPB Inhale 1 puff into the lungs 2 (two) times daily.     No facility-administered medications prior  to visit.      Review of Systems  Review of Systems   Physical Exam  There were no vitals taken for this visit. Physical Exam   Lab Results:  CBC    Component Value Date/Time   WBC 6.4 08/06/2023 0322   RBC 2.47 (L) 08/06/2023 0322   HGB 9.2 (L) 08/06/2023 0322   HGB 10.9 (L) 04/10/2023 1541   HCT 29.2 (L) 08/06/2023 0322   HCT 33.2 (L) 04/10/2023 1541   PLT 169 08/06/2023 0322   PLT 207 04/10/2023 1541   MCV 118.2 (H) 08/06/2023 0322   MCV 119 (H) 04/10/2023 1541   MCH 37.2 (H) 08/06/2023 0322   MCHC 31.5 08/06/2023 0322   RDW 21.9 (H) 08/06/2023 0322   RDW 13.2 04/10/2023 1541   LYMPHSABS 1.0 08/06/2023 0322   LYMPHSABS 0.8 04/10/2023 1541   MONOABS 1.1 (H) 08/06/2023 0322   EOSABS 0.3 08/06/2023 0322   EOSABS 0.3 04/10/2023 1541   BASOSABS 0.1 08/06/2023 0322   BASOSABS 0.1 04/10/2023 1541    BMET    Component Value Date/Time   NA 134 (L) 08/06/2023 0322   NA 138 04/10/2023 1541   K 5.9 (H) 08/06/2023 0322   CL 96 (L) 08/06/2023 0322   CO2 25 08/06/2023 0322   GLUCOSE 93 08/06/2023 0322   BUN 64 (H) 08/06/2023 0322   BUN 19 04/10/2023 1541   CREATININE 7.66 (H) 08/06/2023 0322   CALCIUM  9.7 08/06/2023  0322   GFRNONAA 6 (L) 08/06/2023 0322    BNP No results found for: BNP  ProBNP No results found for: PROBNP  Imaging: CT ABDOMEN PELVIS WO CONTRAST Result Date: 07/31/2023 CLINICAL DATA:  Follow-up abscess drainage EXAM: CT ABDOMEN AND PELVIS WITHOUT CONTRAST TECHNIQUE: Multidetector CT imaging of the abdomen and pelvis was performed following the standard protocol without IV contrast. RADIATION DOSE REDUCTION: This exam was performed according to the departmental dose-optimization program which includes automated exposure control, adjustment of the mA and/or kV according to patient size and/or use of iterative reconstruction technique. COMPARISON:  07/13/2023, 07/07/2023 FINDINGS: Lower chest: Few scattered calcified granulomas are noted. No focal infiltrate is seen. Hepatobiliary: Gallbladder is decompressed with a single lamellated gallstone. Liver is within normal limits. Pancreas: Unremarkable. No pancreatic ductal dilatation or surrounding inflammatory changes. Spleen: Normal in size without focal abnormality. Adrenals/Urinary Tract: Adrenal glands are within normal limits. Kidneys demonstrate atrophy and cortical thinning bilaterally similar to that seen on prior exam. A left nephrostomy catheter is noted in satisfactory position. Prominent right extrarenal pelvis is noted. Some ureteral fullness is noted bilaterally related to chronic bladder wall thickening and irregularity bladder is partially distended. Stomach/Bowel: Changes of prior total colectomy with right lower quadrant ileostomy. A few peristomal herniated loops of small bowel are noted. No obstructive changes are seen. Stomach is well distended. Vascular/Lymphatic: Aortic atherosclerosis. No enlarged abdominal or pelvic lymph nodes. The grafts are noted in the left thigh similar to that noted on prior exam. Reproductive: Prostate is within normal limits. Reservoir for penile prosthesis is noted in the pelvis. Other: Presacral fluid  collection is again noted with drainage catheter within. The collection now measures approximately 3.3 by 2.2 cm in greatest transverse and AP dimensions respectively. The previously seen air following drainage catheter placement in the collection has resolved. Musculoskeletal: Degenerative changes of lumbar spine are noted. IMPRESSION: Drainage catheter within a presacral collection with significant decrease in the size of the collection. Postsurgical changes with total colectomy and right lower quadrant ileostomy.  Cholelithiasis without complicating factors. Left nephrostomy catheter in satisfactory position. Stable bladder wall thickening is noted. Electronically Signed   By: Oneil Devonshire M.D.   On: 07/31/2023 19:03   IR NEPHROSTOMY EXCHANGE LEFT Result Date: 07/17/2023 INDICATION: CHRONIC INDWELLING LEFT NEPHROSTOMY, ROUTINE EXCHANGE EXAM: FLUOROSCOPIC LEFT NEPHROSTOMY EXCHANGE COMPARISON:  07/15/2023 MEDICATIONS: 1% LIDOCAINE  LOCAL ANESTHESIA/SEDATION: None. CONTRAST:  10 cc-administered into the collecting system(s) FLUOROSCOPY: Radiation Exposure Index (as provided by the fluoroscopic device): 2.0 mGy Kerma COMPLICATIONS: None immediate. PROCEDURE: Informed written consent was obtained from the patient after a thorough discussion of the procedural risks, benefits and alternatives. All questions were addressed. Maximal Sterile Barrier Technique was utilized including caps, mask, sterile gowns, sterile gloves, sterile drape, hand hygiene and skin antiseptic. A timeout was performed prior to the initiation of the procedure. Under sterile conditions and local anesthesia, the existing retracted 8 French left nephrostomy was exchanged for a new 10 French nephrostomy over an Amplatz guidewire. Retention loop formed in the renal pelvis. Contrast injection confirms position. Images obtained for documentation. Catheter secured with a silk suture. Sterile dressing applied. Gravity drainage bag connected. IMPRESSION:  Successful fluoroscopic exchange and upsize of the 10 French left nephrostomy Electronically Signed   By: CHRISTELLA.  Shick M.D.   On: 07/17/2023 08:56   DG Abd 1 View Result Date: 07/15/2023 CLINICAL DATA:  History of abscess in the left groin abdomen pain EXAM: ABDOMEN - 1 VIEW COMPARISON:  CT 07/07/2023 FINDINGS: Small amount of radiopaque material in the region of the stomach. Ostomy in the right mid quadrant. History of total colectomy. Interval air distension of small bowel measuring up to 4.8 cm. Penile implant with reservoir in the right pelvis. Left abdominal nephrostomy tube. Interim placement pelvic drainage catheter with pigtail at the inferior pelvis. Question kinked appearance of the catheter along the left side wall. IMPRESSION: 1. Interval air distension of small bowel measuring up to 4.8 cm, possible ileus or obstruction. 2. Interim placement of pelvic drainage catheter with pigtail at the inferior pelvis. Question kinked appearance of the catheter along the left side wall. Electronically Signed   By: Luke Bun M.D.   On: 07/15/2023 16:43     Assessment & Plan:   No problem-specific Assessment & Plan notes found for this encounter.     Almarie LELON Ferrari, NP 08/13/2023

## 2023-08-14 ENCOUNTER — Ambulatory Visit (HOSPITAL_COMMUNITY): Payer: Medicare Other

## 2023-08-18 ENCOUNTER — Ambulatory Visit (HOSPITAL_COMMUNITY)
Admission: RE | Admit: 2023-08-18 | Discharge: 2023-08-18 | Disposition: A | Discharge status: Home / Self Care | Payer: Medicare Other | Source: Ambulatory Visit | Attending: Primary Care | Admitting: Primary Care

## 2023-08-18 ENCOUNTER — Telehealth: Payer: Self-pay | Admitting: Family Medicine

## 2023-08-18 DIAGNOSIS — R0609 Other forms of dyspnea: Secondary | ICD-10-CM | POA: Diagnosis present

## 2023-08-18 NOTE — Telephone Encounter (Signed)
Spoke with Neysa Bonito to let her know that pt is ok to start therapy on Thursday.

## 2023-08-18 NOTE — Telephone Encounter (Signed)
Nursing physical therapy and ot and nursing would like to know if patient can start his therapy on Thursday  [facility] [phone]

## 2023-08-19 ENCOUNTER — Ambulatory Visit
Admission: RE | Admit: 2023-08-19 | Discharge: 2023-08-19 | Disposition: A | Discharge status: Home / Self Care | Payer: Medicare Other | Source: Ambulatory Visit | Attending: General Surgery | Admitting: General Surgery

## 2023-08-19 ENCOUNTER — Ambulatory Visit
Admission: RE | Admit: 2023-08-19 | Discharge: 2023-08-19 | Disposition: A | Discharge status: Home / Self Care | Payer: Medicare Other | Source: Ambulatory Visit | Attending: Student

## 2023-08-19 DIAGNOSIS — K611 Rectal abscess: Secondary | ICD-10-CM

## 2023-08-19 NOTE — Progress Notes (Signed)
Patient ID: Joshua Riley, male   DOB: 08-20-1935, 88 y.o.   MRN: [id number] Patient presented with family member for CT and drain followup. THey state drain fell out at physical therapy the other day. Patient is currently asymptomatic at the prior drain site with no recurrence of presenting symptoms. After discussion of the options, we decided to hold off CT unless or until symptoms recur. He will f/u with PCP.

## 2023-08-20 ENCOUNTER — Encounter: Payer: Self-pay | Admitting: Family Medicine

## 2023-08-20 ENCOUNTER — Ambulatory Visit: Payer: Medicare Other | Admitting: Family Medicine

## 2023-08-20 VITALS — BP 119/64 | HR 79 | Temp 97.7°F | Ht 65.0 in | Wt 164.0 lb

## 2023-08-20 DIAGNOSIS — F32A Depression, unspecified: Secondary | ICD-10-CM | POA: Diagnosis not present

## 2023-08-20 DIAGNOSIS — Z1612 Extended spectrum beta lactamase (ESBL) resistance: Secondary | ICD-10-CM

## 2023-08-20 DIAGNOSIS — A498 Other bacterial infections of unspecified site: Secondary | ICD-10-CM

## 2023-08-20 DIAGNOSIS — K651 Peritoneal abscess: Secondary | ICD-10-CM

## 2023-08-20 DIAGNOSIS — K51919 Ulcerative colitis, unspecified with unspecified complications: Secondary | ICD-10-CM

## 2023-08-20 DIAGNOSIS — N186 End stage renal disease: Secondary | ICD-10-CM

## 2023-08-20 DIAGNOSIS — Z8619 Personal history of other infectious and parasitic diseases: Secondary | ICD-10-CM

## 2023-08-20 DIAGNOSIS — Z22358 Carrier of other enterobacterales: Secondary | ICD-10-CM

## 2023-08-20 DIAGNOSIS — Z936 Other artificial openings of urinary tract status: Secondary | ICD-10-CM

## 2023-08-20 DIAGNOSIS — Z992 Dependence on renal dialysis: Secondary | ICD-10-CM

## 2023-08-20 DIAGNOSIS — I4819 Other persistent atrial fibrillation: Secondary | ICD-10-CM

## 2023-08-20 MED ORDER — VALACYCLOVIR HCL 500 MG PO TABS
500.0000 mg | ORAL_TABLET | Freq: Every day | ORAL | 0 refills | Status: DC
Start: 2023-08-20 — End: 2023-09-08

## 2023-08-20 NOTE — Progress Notes (Signed)
Phone [phone] In person visit   Subjective:   Joshua Riley is a 88 y.o. year old very pleasant male patient who presents for/with See problem oriented charting Chief Complaint  Patient presents with   Hospitalization Follow-up    Pt here for hosp f/u - pt did not bring in updated med list    Past Medical History-  Patient Active Problem List   Diagnosis Date Noted   DOE (dyspnea on exertion) 07/22/2022    Priority: High   Ulcerative colitis (HCC) 06/02/2022    Priority: High   End-stage renal disease on hemodialysis (HCC) 05/06/2022    Priority: High   Atrial fibrillation, persistent (HCC) 11/18/2021    Priority: High   Orthostatic hypotension 10/22/2021    Priority: High   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    Priority: High   Nephrostomy present (HCC) 10/22/2021    Priority: High   History of anemia due to chronic kidney disease 07/08/2023    Priority: Medium    Depression 04/08/2023    Priority: Medium    History of sepsis 06/02/2022    Priority: Medium    Glaucoma 11/18/2021    Priority: Medium    Idiopathic neuropathy 10/22/2021    Priority: Medium    Perirectal abscess 03/23/2023    Priority: Low   Coag negative Staphylococcus bacteremia 06/06/2022    Priority: Low   SBO (small bowel obstruction) (HCC) 11/18/2021    Priority: Low   Abscess of abdominal cavity (HCC) 07/17/2023   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   Anastomotic leak of intestine 07/14/2023   History of COPD 07/08/2023   Chronic diastolic CHF (congestive heart failure) (HCC) 07/08/2023   Abscess of groin, left 07/07/2023   Macrocytic anemia 04/08/2023   Infection due to ESBL-producing Escherichia coli 03/23/2023    Medications- reviewed and updated Current Outpatient Medications  Medication Sig Dispense Refill   Albuterol Sulfate 2.5 MG/0.5ML NEBU Inhale 0.5 mLs (2.5 mg total) into the lungs every 6 (six) hours. 60 mL 5   BIOTIN PO Take 1 capsule by mouth daily.     budesonide  (PULMICORT) 0.5 MG/2ML nebulizer solution TAKE 2 ML (0.5 MG TOTAL) BY NEBULIZATION TWICE A DAY 360 mL 1   Cyanocobalamin (B-12) 1000 MCG SUBL Place 1 tablet under the tongue daily at 6 (six) AM. 90 tablet 3   dorzolamide (TRUSOPT) 2 % ophthalmic solution Place 1 drop into both eyes 2 (two) times daily.     ELIQUIS 2.5 MG TABS tablet Take 1 tablet (2.5 mg total) by mouth 2 (two) times daily. 60 tablet 11   ferrous sulfate 325 (65 FE) MG tablet Take 1 tablet (325 mg total) by mouth daily with breakfast. 30 tablet 3   fluconazole (DIFLUCAN) 100 MG tablet Take 1 tablet (100 mg total) by mouth daily. 4 tablet 0   folic acid (FOLVITE) 1 MG tablet Take 1 tablet (1 mg total) by mouth daily. 30 tablet 0   latanoprost (XALATAN) 0.005 % ophthalmic solution Place 1 drop into the right eye at bedtime.     magnesium oxide (MAG-OX) 400 (240 Mg) MG tablet Take 1 tablet (400 mg total) by mouth 2 (two) times daily. 60 tablet 0   melatonin 3 MG TABS tablet Take 1 tablet (3 mg total) by mouth at bedtime as needed (insomnia). 30 tablet 0   midodrine (PROAMATINE) 10 MG tablet Take 1 tablet (10 mg total) by mouth 3 (three) times daily with meals. 90 tablet 0   Omega-3 Fatty  Acids (OMEGA-3 FISH OIL) 500 MG CAPS Take 1 capsule by mouth daily.     pantoprazole (PROTONIX) 40 MG tablet Take 1 tablet (40 mg total) by mouth 2 (two) times daily. 60 tablet 0   ramelteon (ROZEREM) 8 MG tablet TAKE 1 TABLET BY MOUTH AT BEDTIME. 90 tablet 2   riboflavin (VITAMIN B-2) 100 MG TABS tablet Take 100 mg by mouth daily.     sertraline (ZOLOFT) 25 MG tablet TAKE 1 TABLET (25 MG TOTAL) BY MOUTH DAILY. 90 tablet 2   sevelamer carbonate (RENVELA) 2.4 g PACK Take 2.4 g by mouth 3 (three) times daily with meals. 90 each 0   timolol (TIMOPTIC) 0.5 % ophthalmic solution Place 1 drop into both eyes daily.     valACYclovir (VALTREX) 500 MG tablet Take 1 tablet (500 mg total) by mouth daily. 7 tablet 0   WIXELA INHUB 250-50 MCG/ACT AEPB Inhale 1  puff into the lungs 2 (two) times daily.     No current facility-administered medications for this visit.     Objective:  BP 119/64   Pulse 79   Temp 97.7 F (36.5 C)   Ht 5\' 5"  (1.651 m)   Wt 164 lb (74.4 kg)   SpO2 97%   BMI 27.29 kg/m  Gen: NAD, resting comfortably CV: irregularly irregular  no murmurs rubs or gallops Lungs: CTAB no crackles, wheeze, rhonchi Abdomen: soft/nontender/nondistended/normal bowel sounds. No rebound or guarding.  Colostomy and nephrostomy in place-no open site at prior percutaneous abscess drainage.  Patient declines rectal exam to examine amount of drainage Ext: Trace edema     Assessment and Plan   # Right ear fullness-patient with significant cerumen impaction-attempted curette but this was irritating to him so team finished with irrigation and he was discharged today -Has visit March 3 with hearing solutions to adjust hearing aids as well so he wanted to make sure this was cleared  # Hospital follow up for perirectal abscess leading to worsening abscess of abdominal cavity and associated sepsis S: Patient was admitted from 07/07/2023 to 07/21/2023 due to perirectal abscess and associated sepsis.  Patient with a boil 3 weeks prior to admission and was initially seen outpatient by one of my colleagues and given doxycycline but he had no improvement and later found to have groin abscess and abscess in presacral space.  He had a pre-existing fluid collection in the presacral space but this had enlarged from 06/11/2023-original plan had been [facility] consultation-Dr. Kirkland Hun with colorectal surgery.  Interventional radiology placed a drain and plan was for this to be managed by Dr. Loleta Chance at [facility] on an outpatient basis.  Culture showed ESBL E. 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  He is chronically on midodrine  Patient also had anemia noted likely related to chronic kidney disease.  He declined transfusion initially but later received 1 unit-CBC was checked on February 1 and anemia had improved with hemoglobin at 9.6 up slightly from discharge  He developed small bowel obstruction versus ileus-narcotics were avoided and surgery was reconsulted but eventually this resolved with bowel rest.  He denies any abdominal issues today  For his atrial fibrillation on Eliquis-intermittently had paroxysmal atrial fibrillation during admission and was started on IV amiodarone and transition to oral but this had to be stopped due to bradycardia and sinus pauses.  Plan was for metoprolol as developed RVR  For his depression he was continued on Zoloft.  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  Unfortunately patient reports that he has drain came out while in rehab and this was started away.  He saw interventional radiology yesterday and they said it was not an option to place this again and as he was doing well they opted to monitor A/P: Patient with appropriate treatment for ESBL E. coli presacral space infection including drain placement and 4 weeks of IV antibiotics with meropenem.  He reports feeling well overall.  I did recommend follow-up with [facility] colorectal surgery but he declines at this time-is willing to reconsider if things worsen.  Unfortunate drain fell out but IR did not recommend intervention-once again would like [facility]'s opinion on this and possible repeat imaging further recommendations -As far as the enterocutaneous fistula-he  reports much less drainage from the rectum at this point-he knows to let us know if this worsens sounds like a good that interesting day -Offered blood work today but gets this done with nephrology regularly and I can see some from February 1 which appeared stable-he prefers to hold off -We were not 100% in agreement on his medication list and he agrees to bring all medicines to next visit as he had a hard time recalling all of them today  # Atrial fibrillation- prior Dr. Shari Prows S: Rate controlled without medicine Anticoagulated with eliquis 2.5 mg BID A/P: Appropriately anticoagulated.  Rate controlled without medicine-continue to monitor  #ESRD- sees Dr. Ronalee Belts #Hypotension S: Patient is on dialysis MWF-reports doing well with this Medication: Renvela 800 mg-takes 2 tablets 3 times a day.  Requires Lokelma as well 4 times a week on nondialysis days -Does have a tendency towards low blood pressures with dialysis days-takes midodrine 15 mg 3 times a day prescribed by Dr. Shari Prows originally but appears to be down to 10 mg 3 times a day-he is going to bring his meds for confirmation next visit A/P: ESRD stable with dialysis.  Hypertension managed with midodrine-trying to find the dose  # history of ulcerative colitis led to complete colectomy in 2012- has colostomy bag (failed J patch)-colostomy status noted-ulcerative colitis was appropriately treated with colostomy -also has left nephrostomy as complication of colectomy which led to quicker dialysis per his report (sounds like had CKD IV prior to this). Still urinates from urethra some  # Depression S:Medication: Sertraline 25 mg     05/22/2023    9:13 AM 02/10/2023    9:06 AM 11/11/2022    7:37 AM  Depression screen PHQ 2/9  Decreased Interest 0 0 0  Down, Depressed, Hopeless 1 1 0  PHQ - 2 Score 1 1 0  Altered sleeping 3 0   Tired, decreased energy 2 1   Change in appetite 0 0   Feeling bad or failure about yourself  0 0    Trouble concentrating 0 0   Moving slowly or fidgety/restless 0 0   Suicidal thoughts 0 1   PHQ-9 Score 6 3   Difficult doing work/chores Somewhat difficult Not difficult at all   A/P: Patient reports reasonable control on  sertraline-PHQ-9 mildly elevated but recent illness was hard and seems primarily situational  Recommended follow up: Return in about 3 months (around 11/17/2023) for followup or sooner if needed.Schedule b4 you leave. Future Appointments  Date Time Provider Department Center  09/03/2023 11:20 AM Swinyer, Zachary George, NP [facility]  09/08/2023 11:30 AM Kalman Shan, MD [facility] None    Lab/Order associations:   ICD-10-CM   1. Abscess of abdominal cavity (HCC)  K65.1     2. ESBL E. coli carrier  Z22.358     3. Depression, unspecified depression type  F32.A     4. Infection due to ESBL-producing Escherichia coli  A49.8    Z16.12     5. History of sepsis  Z86.19     6. Nephrostomy present (HCC)  Z93.6     7. Atrial fibrillation, persistent (HCC)  I48.19     8. End-stage renal disease on hemodialysis (HCC)  N18.6    Z99.2     9. Ulcerative colitis with complication, unspecified location [facility])  K51.919       Meds ordered this encounter  Medications   valACYclovir (VALTREX) 500 MG tablet    Sig: Take 1 tablet (500 mg total) by mouth daily.    Dispense:  7 tablet    Refill:  0    Return precautions advised.  Tana Conch, MD

## 2023-08-20 NOTE — Addendum Note (Signed)
Addended by: Shelva Majestic on: 08/20/2023 06:03 PM   Modules accepted: Level of Service

## 2023-08-20 NOTE — Patient Instructions (Addendum)
Bring all medications to next visit  No changes today other than trying valtrex for possible cold sores  Team will try irrigation   Recommended follow up: Return in about 3 months (around 11/17/2023) for followup or sooner if needed.Schedule b4 you leave.

## 2023-08-24 ENCOUNTER — Telehealth: Payer: Self-pay

## 2023-08-24 NOTE — Telephone Encounter (Signed)
 Called and spoke with Will and VO given.   Copied from CRM [id number]. Topic: Clinical - Home Health Verbal Orders >> Aug 24, 2023  3:22 PM [place] J wrote: Caller/Agency: Will / [facility] Callback Number: [phone] Service Requested: Occupational Therapy Frequency: 1 week ; 4 Any new concerns about the patient? No

## 2023-08-27 ENCOUNTER — Telehealth: Payer: Self-pay | Admitting: Family Medicine

## 2023-08-27 NOTE — Telephone Encounter (Signed)
 Copied from CRM [id number]. Topic: General - Other >> Aug 26, 2023  4:43 PM Alcus Dad wrote: Reason for CRM: Physical Therapist Shareece from [facility] wanted the approval of PT for patient   Ok to give VO? Miku Udall,RMA

## 2023-08-27 NOTE — Telephone Encounter (Signed)
 May give verbal order for therapy-we are happy to see him about the fall if he desires-avoiding falls is going to be critical of his age-can advance supportive device if needed such as if using cane can try for walker

## 2023-08-27 NOTE — Telephone Encounter (Unsigned)
 Copied from CRM [id number]. Topic: Clinical - Medical Advice >> Aug 26, 2023  4:44 PM Alcus Dad wrote: Reason for CRM: Physical therapist is reporting a fall... Yesterday morning patient was going to the bank and he trip on something and fell on the both of his hands. Patient states that it was bleeding but stopped. PT stated that patient said that he was ok. PT also states that patient has two skin tears on his left elbow. If you need more info call [phone]

## 2023-08-28 ENCOUNTER — Telehealth: Payer: Self-pay | Admitting: Family Medicine

## 2023-08-28 NOTE — Telephone Encounter (Signed)
 Received faxed  document Home Health Certificate (Order ID [id number]), to be filled out by provider. Patient requested to send it back via Fax . Document is located in providers tray at front office.Please advise

## 2023-08-28 NOTE — Telephone Encounter (Signed)
 Called and spoke with Joshua Riley and VO given.

## 2023-08-28 NOTE — Telephone Encounter (Signed)
 Forms completed and faxed back.

## 2023-09-02 ENCOUNTER — Encounter: Payer: Self-pay | Admitting: Internal Medicine

## 2023-09-02 NOTE — Telephone Encounter (Signed)
 No notes in appt desk about rescheduling. Please advise patient/family, thank you!

## 2023-09-03 ENCOUNTER — Ambulatory Visit: Payer: Medicare Other | Admitting: Nurse Practitioner

## 2023-09-03 ENCOUNTER — Encounter: Payer: Self-pay | Admitting: Nurse Practitioner

## 2023-09-03 VITALS — BP 100/58 | HR 77 | Ht 68.0 in | Wt 158.0 lb

## 2023-09-03 DIAGNOSIS — K611 Rectal abscess: Secondary | ICD-10-CM | POA: Insufficient documentation

## 2023-09-03 DIAGNOSIS — I953 Hypotension of hemodialysis: Secondary | ICD-10-CM | POA: Insufficient documentation

## 2023-09-03 DIAGNOSIS — I4819 Other persistent atrial fibrillation: Secondary | ICD-10-CM | POA: Insufficient documentation

## 2023-09-03 DIAGNOSIS — L03115 Cellulitis of right lower limb: Secondary | ICD-10-CM | POA: Insufficient documentation

## 2023-09-03 DIAGNOSIS — I951 Orthostatic hypotension: Secondary | ICD-10-CM | POA: Insufficient documentation

## 2023-09-03 DIAGNOSIS — N186 End stage renal disease: Secondary | ICD-10-CM | POA: Insufficient documentation

## 2023-09-03 DIAGNOSIS — Z7901 Long term (current) use of anticoagulants: Secondary | ICD-10-CM | POA: Insufficient documentation

## 2023-09-03 DIAGNOSIS — K56600 Partial intestinal obstruction, unspecified as to cause: Secondary | ICD-10-CM | POA: Diagnosis not present

## 2023-09-03 DIAGNOSIS — E875 Hyperkalemia: Secondary | ICD-10-CM | POA: Diagnosis not present

## 2023-09-03 NOTE — Progress Notes (Addendum)
 Cardiology Office Note:  .   Date:  09/03/2023  ID:  Jeanmarie Hubert, DOB June 30, 1936, MRN [id number] PCP: Shelva Majestic, MD  [facility] Providers Cardiologist:  Rollene Rotunda, MD    Patient Profile: .      PMH Persistent atrial fibrillation on chronic anticoagulation GIB ESRD on HD DVT Ulcerative colitis s/p colostomy 2012 Orthostatic hypotension  Known history of persistent atrial fibrillation for which he has been maintained on Eliquis for stroke prevention.  Not on nodal agents due to hypotension with HD requiring midodrine.  Per cardiology records he has a history of a stress test 11/2020 which was negative for ischemia.  TTE with normal LVEF with mitral sclerosis.  Seen in cardiology clinic 05/2022 with worsening shortness of breath with minimal exertion.  We scheduled a cath but then he was admitted 11/27-12/08/2021 prior to procedure for fever up to 103 after HD with confusion.  WBC 7.8K, influenza, COVID-19 PCR negative.  Blood culture positive for staph.  Seen by ID who recommended removal of right groin TDC which was removed by vascular surgery after his left groin AV graft was functional.  Repeat blood cultures were negative.  He requested to postpone catheterization until early January.  TTE 05/2022 with EF 60 to 65%, normal strain, normal RV, trivial MR, ascending aorta 42 mm.  He underwent LHC on 07/22/2022 which showed no significant CAD and normal LVEDP.  At follow-up visit he reported walking more and able to walk at 3 miles per hour on treadmill for 15 minutes.  He continued to have drop in BP with HD and remained on midodrine.  He had BRBPR and Eliquis was decreased to once daily.  He was referred to [facility] for management.  Last cardiology clinic visit was 02/19/2023 with Dr. Eldridge Dace.  He remained on Eliquis 2.5 once daily with GI evaluation upcoming at [facility].  The bleeding had improved.  Risks and benefits of anticoagulation were discussed and it was decided to  continue once daily Eliquis.  If bleeding worsens, would have to consider stopping Eliquis with the understanding of increased risk of stroke.       History of Present Illness: .   Turon Kilmer is a very pleasant 88 y.o. male who is here for follow-up of atrial fibrillation, he is accompanied by his daughter. He reports exhaustion and fatigue, particularly after dialysis sessions. Daughter states he describes this as shortness of breath, but she thinks he feels significant fatigue that he describes as SOB.  He was was recently hospitalized for perirectal abscess and subsequent sepsis. He was advised to resume Eliquis 2.5mg  twice daily. Reports he takes midodrine 15mg  three times daily and sometimes an additional 10 mg during dialysis sessions. He reports feeling well on non-dialysis days and denies chest pain, palpitations, dyspnea, orthopnea, PND, presyncope or syncope. Was diagnosed with right leg cellulitis. Daughter expresses concern about his exhaustion and fatigue, suspecting it may be related to BP, particularly following hemodialysis.   Discussed the use of AI scribe software for clinical note transcription with the patient, who gave verbal consent to proceed.   ROS: See HPI       Studies Reviewed: .        Risk Assessment/Calculations:    CHA2DS2-VASc Score = 5   This indicates a 7.2% annual risk of stroke. The patient's score is based upon: CHF History: 0 HTN History: 0 Diabetes History: 0 Stroke History: 2 Vascular Disease History: 1 Age Score: 2 Gender Score: 0  Physical Exam:   VS:  BP (!) 100/58 (BP Location: Right Arm)   Pulse 77   Ht 5\' 8"  (1.727 m)   Wt 158 lb (71.7 kg)   SpO2 97%   BMI 24.02 kg/m    Wt Readings from Last 3 Encounters:  09/03/23 158 lb (71.7 kg)  08/20/23 164 lb (74.4 kg)  07/21/23 152 lb 8.9 oz (69.2 kg)    GEN: Well nourished, well developed in no acute distress NECK: No JVD; No carotid bruits CARDIAC: RRR, no murmurs,  rubs, gallops RESPIRATORY:  Clear to auscultation without rales, wheezing or rhonchi  ABDOMEN: Soft, non-tender, non-distended EXTREMITIES:  No edema; No deformity     ASSESSMENT AND PLAN: .    Perirectal abscess: Lupita Leash reports he was found to have pelvic abscess. Per record review, he has perirectal abscess and associated sepsis that required hospitalization 07/2023. Reports he was advised to resume Eliquis at 2.5 mg BID.  Most recent CBC 08/08/2023 reveals hgb 9.6. This has been stable per his report. No acute concerns today.   Hypotension: Induced by hemodialysis. BP is stable today. Reports he takes additional midodrine during dialysis.  He is doing well on nondialysis days with no significant symptoms of syncope or presyncope. He reports varying doses of midodrine prior to and during HD.  Advised I cannot recommend more than 10 mg 3 times daily which I recommend on nondialysis days.  Persistent AF on chronic anticoagulation: HR is well controlled. He is no longer on AV nodal blocking agents due to hypotension. He has resumed Eliquis 2.5 mg twice daily and denies bleeding concerns.  Continue Eliquis 2.5 mg twice daily which is appropriate dose for stroke prevention for CHA2DS2-VASc score of 5.  Leg cellulitis: Advised leg elevation and potential use of lounge doctor.   ESRD: HD on MWF.  He continues to have hemodialysis induced hypotension.  He takes midodrine 15 mg 3 times daily. States he was told to take an additional 10 mg of midodrine following dialysis for hypotension by nephrologist. Management per nephrology.        Disposition:6 months with me  Signed, Eligha Bridegroom, NP-C

## 2023-09-03 NOTE — Patient Instructions (Signed)
 Medication Instructions:   Your physician recommends that you continue on your current medications as directed. Please refer to the Current Medication list given to you today.   *If you need a refill on your cardiac medications before your next appointment, please call your pharmacy*   Lab Work:  None ordered.  If you have labs (blood work) drawn today and your tests are completely normal, you will receive your results only by: MyChart Message (if you have MyChart) OR A paper copy in the mail If you have any lab test that is abnormal or we need to change your treatment, we will call you to review the results.   Testing/Procedures:  None ordered.   Follow-Up: At [facility], you and your health needs are our priority.  As part of our continuing mission to provide you with exceptional heart care, we have created designated Provider Care Teams.  These Care Teams include your primary Cardiologist (physician) and Advanced Practice Providers (APPs -  Physician Assistants and Nurse Practitioners) who all work together to provide you with the care you need, when you need it.  We recommend signing up for the patient portal called "MyChart".  Sign up information is provided on this After Visit Summary.  MyChart is used to connect with patients for Virtual Visits (Telemedicine).  Patients are able to view lab/test results, encounter notes, upcoming appointments, etc.  Non-urgent messages can be sent to your provider as well.   To learn more about what you can do with MyChart, go to [email or web address].    Your next appointment:   6 month(s)  Provider:   Eligha Bridegroom, NP    Other Instructions  I will send you a mychart message in May with August appointment on a Tuesday or Thursday after 10 at the [facility] location.     For your  leg edema you  should do  the following 1. Leg elevation - I recommend the Lounge Dr. Leg rest.  See below for details  2. Salt  restriction  -  Use potassium chloride instead of regular salt as a salt substitute. 3. Walk regularly 4. Compression hose - Medical Supply store  5. Weight loss    Available on [email or web address] Or  Go to [email or web address]

## 2023-09-06 ENCOUNTER — Encounter (HOSPITAL_BASED_OUTPATIENT_CLINIC_OR_DEPARTMENT_OTHER): Payer: Self-pay | Admitting: Emergency Medicine

## 2023-09-06 ENCOUNTER — Inpatient Hospital Stay (HOSPITAL_BASED_OUTPATIENT_CLINIC_OR_DEPARTMENT_OTHER)
Admission: EM | Admit: 2023-09-06 | Discharge: 2023-09-08 | DRG: 388 | Disposition: A | Discharge status: Home / Self Care | Attending: Internal Medicine | Admitting: Internal Medicine

## 2023-09-06 ENCOUNTER — Emergency Department (HOSPITAL_BASED_OUTPATIENT_CLINIC_OR_DEPARTMENT_OTHER)

## 2023-09-06 ENCOUNTER — Other Ambulatory Visit: Payer: Self-pay

## 2023-09-06 DIAGNOSIS — I480 Paroxysmal atrial fibrillation: Secondary | ICD-10-CM | POA: Diagnosis present

## 2023-09-06 DIAGNOSIS — I4891 Unspecified atrial fibrillation: Secondary | ICD-10-CM | POA: Diagnosis present

## 2023-09-06 DIAGNOSIS — J449 Chronic obstructive pulmonary disease, unspecified: Secondary | ICD-10-CM | POA: Diagnosis present

## 2023-09-06 DIAGNOSIS — K219 Gastro-esophageal reflux disease without esophagitis: Secondary | ICD-10-CM | POA: Diagnosis present

## 2023-09-06 DIAGNOSIS — Z87891 Personal history of nicotine dependence: Secondary | ICD-10-CM | POA: Diagnosis not present

## 2023-09-06 DIAGNOSIS — E875 Hyperkalemia: Secondary | ICD-10-CM | POA: Diagnosis present

## 2023-09-06 DIAGNOSIS — N186 End stage renal disease: Secondary | ICD-10-CM | POA: Diagnosis present

## 2023-09-06 DIAGNOSIS — I4819 Other persistent atrial fibrillation: Secondary | ICD-10-CM | POA: Diagnosis present

## 2023-09-06 DIAGNOSIS — Z85828 Personal history of other malignant neoplasm of skin: Secondary | ICD-10-CM | POA: Diagnosis not present

## 2023-09-06 DIAGNOSIS — L03115 Cellulitis of right lower limb: Secondary | ICD-10-CM | POA: Diagnosis present

## 2023-09-06 DIAGNOSIS — Z936 Other artificial openings of urinary tract status: Secondary | ICD-10-CM | POA: Diagnosis not present

## 2023-09-06 DIAGNOSIS — F32A Depression, unspecified: Secondary | ICD-10-CM | POA: Diagnosis present

## 2023-09-06 DIAGNOSIS — N2581 Secondary hyperparathyroidism of renal origin: Secondary | ICD-10-CM | POA: Diagnosis present

## 2023-09-06 DIAGNOSIS — Z79899 Other long term (current) drug therapy: Secondary | ICD-10-CM

## 2023-09-06 DIAGNOSIS — D631 Anemia in chronic kidney disease: Secondary | ICD-10-CM | POA: Diagnosis present

## 2023-09-06 DIAGNOSIS — H409 Unspecified glaucoma: Secondary | ICD-10-CM | POA: Diagnosis present

## 2023-09-06 DIAGNOSIS — D539 Nutritional anemia, unspecified: Secondary | ICD-10-CM | POA: Diagnosis present

## 2023-09-06 DIAGNOSIS — Z800 Family history of malignant neoplasm of digestive organs: Secondary | ICD-10-CM

## 2023-09-06 DIAGNOSIS — N134 Hydroureter: Secondary | ICD-10-CM | POA: Diagnosis present

## 2023-09-06 DIAGNOSIS — N189 Chronic kidney disease, unspecified: Secondary | ICD-10-CM

## 2023-09-06 DIAGNOSIS — Z8616 Personal history of COVID-19: Secondary | ICD-10-CM | POA: Diagnosis not present

## 2023-09-06 DIAGNOSIS — Z809 Family history of malignant neoplasm, unspecified: Secondary | ICD-10-CM

## 2023-09-06 DIAGNOSIS — Z7951 Long term (current) use of inhaled steroids: Secondary | ICD-10-CM

## 2023-09-06 DIAGNOSIS — Z862 Personal history of diseases of the blood and blood-forming organs and certain disorders involving the immune mechanism: Secondary | ICD-10-CM | POA: Diagnosis not present

## 2023-09-06 DIAGNOSIS — K5190 Ulcerative colitis, unspecified, without complications: Secondary | ICD-10-CM | POA: Diagnosis present

## 2023-09-06 DIAGNOSIS — K56609 Unspecified intestinal obstruction, unspecified as to partial versus complete obstruction: Principal | ICD-10-CM | POA: Diagnosis present

## 2023-09-06 DIAGNOSIS — Z823 Family history of stroke: Secondary | ICD-10-CM

## 2023-09-06 DIAGNOSIS — Z9049 Acquired absence of other specified parts of digestive tract: Secondary | ICD-10-CM | POA: Diagnosis not present

## 2023-09-06 DIAGNOSIS — I9589 Other hypotension: Secondary | ICD-10-CM | POA: Diagnosis present

## 2023-09-06 DIAGNOSIS — Z8673 Personal history of transient ischemic attack (TIA), and cerebral infarction without residual deficits: Secondary | ICD-10-CM

## 2023-09-06 DIAGNOSIS — Z933 Colostomy status: Secondary | ICD-10-CM | POA: Diagnosis not present

## 2023-09-06 DIAGNOSIS — Z932 Ileostomy status: Secondary | ICD-10-CM

## 2023-09-06 DIAGNOSIS — K760 Fatty (change of) liver, not elsewhere classified: Secondary | ICD-10-CM

## 2023-09-06 DIAGNOSIS — Q6479 Other congenital malformations of bladder and urethra: Secondary | ICD-10-CM

## 2023-09-06 DIAGNOSIS — Z992 Dependence on renal dialysis: Secondary | ICD-10-CM | POA: Diagnosis not present

## 2023-09-06 DIAGNOSIS — Z888 Allergy status to other drugs, medicaments and biological substances status: Secondary | ICD-10-CM

## 2023-09-06 DIAGNOSIS — Z8709 Personal history of other diseases of the respiratory system: Secondary | ICD-10-CM

## 2023-09-06 DIAGNOSIS — Z86718 Personal history of other venous thrombosis and embolism: Secondary | ICD-10-CM

## 2023-09-06 DIAGNOSIS — K56600 Partial intestinal obstruction, unspecified as to cause: Principal | ICD-10-CM | POA: Diagnosis present

## 2023-09-06 DIAGNOSIS — Z7901 Long term (current) use of anticoagulants: Secondary | ICD-10-CM

## 2023-09-06 DIAGNOSIS — Z881 Allergy status to other antibiotic agents status: Secondary | ICD-10-CM | POA: Diagnosis not present

## 2023-09-06 LAB — COMPREHENSIVE METABOLIC PANEL
ALT: 10 U/L (ref 0–44)
AST: 10 U/L — ABNORMAL LOW (ref 15–41)
Albumin: 4.9 g/dL (ref 3.5–5.0)
Alkaline Phosphatase: 115 U/L (ref 38–126)
Anion gap: 20 — ABNORMAL HIGH (ref 5–15)
BUN: 86 mg/dL — ABNORMAL HIGH (ref 8–23)
CO2: 24 mmol/L (ref 22–32)
Calcium: 9.2 mg/dL (ref 8.9–10.3)
Chloride: 94 mmol/L — ABNORMAL LOW (ref 98–111)
Creatinine, Ser: 9.32 mg/dL — ABNORMAL HIGH (ref 0.61–1.24)
GFR, Estimated: 5 mL/min — ABNORMAL LOW (ref >60)
Glucose, Bld: 124 mg/dL — ABNORMAL HIGH (ref 70–99)
Potassium: 5.8 mmol/L — ABNORMAL HIGH (ref 3.5–5.1)
Sodium: 138 mmol/L (ref 135–145)
Total Bilirubin: 0.6 mg/dL (ref 0.0–1.2)
Total Protein: 7.7 g/dL (ref 6.5–8.1)

## 2023-09-06 LAB — CBC WITH DIFFERENTIAL/PLATELET
Abs Immature Granulocytes: 0.06 10*3/uL (ref 0.00–0.07)
Basophils Absolute: 0 10*3/uL (ref 0.0–0.1)
Basophils Relative: 0 %
Eosinophils Absolute: 0.2 10*3/uL (ref 0.0–0.5)
Eosinophils Relative: 2 %
HCT: 34 % — ABNORMAL LOW (ref 39.0–52.0)
Hemoglobin: 11.3 g/dL — ABNORMAL LOW (ref 13.0–17.0)
Immature Granulocytes: 1 %
Lymphocytes Relative: 6 %
Lymphs Abs: 0.7 10*3/uL (ref 0.7–4.0)
MCH: 38.4 pg — ABNORMAL HIGH (ref 26.0–34.0)
MCHC: 33.2 g/dL (ref 30.0–36.0)
MCV: 115.6 fL — ABNORMAL HIGH (ref 80.0–100.0)
Monocytes Absolute: 0.9 10*3/uL (ref 0.1–1.0)
Monocytes Relative: 8 %
Neutro Abs: 9.3 10*3/uL — ABNORMAL HIGH (ref 1.7–7.7)
Neutrophils Relative %: 83 %
Platelets: 197 10*3/uL (ref 150–400)
RBC: 2.94 MIL/uL — ABNORMAL LOW (ref 4.22–5.81)
RDW: 17.6 % — ABNORMAL HIGH (ref 11.5–15.5)
WBC: 11.2 10*3/uL — ABNORMAL HIGH (ref 4.0–10.5)
nRBC: 0 % (ref 0.0–0.2)

## 2023-09-06 LAB — LIPASE, BLOOD: Lipase: 49 U/L (ref 11–51)

## 2023-09-06 MED ORDER — ONDANSETRON HCL 4 MG/2ML IJ SOLN
4.0000 mg | Freq: Once | INTRAMUSCULAR | Status: AC
  Administered 2023-09-06: 4 mg via INTRAVENOUS
  Filled 2023-09-06: qty 2

## 2023-09-06 MED ORDER — MORPHINE SULFATE (PF) 2 MG/ML IV SOLN
2.0000 mg | Freq: Once | INTRAVENOUS | Status: AC
  Administered 2023-09-06: 2 mg via INTRAVENOUS
  Filled 2023-09-06: qty 1

## 2023-09-06 MED ORDER — SODIUM ZIRCONIUM CYCLOSILICATE 10 G PO PACK
10.0000 g | PACK | Freq: Once | ORAL | Status: AC
  Administered 2023-09-06: 10 g via ORAL
  Filled 2023-09-06: qty 1

## 2023-09-06 MED ORDER — IOHEXOL 300 MG/ML  SOLN
100.0000 mL | Freq: Once | INTRAMUSCULAR | Status: AC | PRN
  Administered 2023-09-06: 100 mL via INTRAVENOUS

## 2023-09-06 MED ORDER — MORPHINE SULFATE (PF) 2 MG/ML IV SOLN
2.0000 mg | Freq: Once | INTRAVENOUS | Status: AC
Start: 2023-09-06 — End: 2023-09-06
  Administered 2023-09-06: 2 mg via INTRAVENOUS
  Filled 2023-09-06: qty 1

## 2023-09-06 NOTE — ED Notes (Signed)
 Pt placed on2 lt [place] due to SATS at 87%.  Sats rebounded to 100%

## 2023-09-06 NOTE — Progress Notes (Signed)
 Plan of Care Note for accepted transfer   Patient: Joshua Riley MRN: [id number]   DOA: 09/06/2023  Facility requesting transfer: [facility] Requesting Provider: Sloan Leiter, DO  Reason for transfer: SBO Facility course: Joshua Riley is a 88 y.o. male with past medical history as below, significant for ESRD on HD Monday Wednesday Friday, ileostomy, ulcerative colitis, A-fib on Eliquis who presents to the ED with complaint of abdominal pain, constipation   Prior abdominal surgeries, prior bowel obstruction.  He has not had bowel movement since this morning, no drainage from his ostomy since this morning.  He has not noticed any blood or black stool in his ostomy.  No change to the appearance of his ostomy.  He is having some diffuse abdominal pain, no nausea or vomiting.  No fevers or chills.  Normal state of health prior to onset the symptoms.  Last dialysis was on Friday.  Upon presentation to the ER vital signs were within normal.  Labs revealed mild hyperkalemia of 5.8 and chloride of 94 with BUN of 86 and creatinine 1.32 with anion gap of 20.  LFTs were within normal.  CBC showed mild leukocytosis of 11.2 with neutrophilia and anemia better than previous levels with macrocytosis.  Abdominal pelvic CT scan revealed the following: 1. Small-bowel obstruction with abrupt transition point in the left anterior abdomen. 2. Irregular bladder wall thickening on the left. Moderate right hydroureter distally. Query ureteral obstruction from bladder mass. Cystoscopy is recommended if not performed previously. 3. Percutaneous nephrostomy tube in the left kidney. Interval decompression of the left hydroureter. 4. Interval removal of the presacral drain. The presacral fluid collection is unchanged measuring 3.7 x 5.5 cm (using similar measuring technique). 5. Hepatic steatosis. 6. Cholelithiasis without evidence of acute cholecystitis. 7. Aortic Atherosclerosis.  The patient was given 2 mg of  IV morphine sulfate twice and 4 mg of IV Zofran as well as 10 g of p.o. Lokelma.  Dr. Dwain Sarna with general surgery was contacted and is aware about the patient.  He will need to be consulted upon arrival.  Plan of care: The patient is accepted for admission to Telemetry unit, at [facility] for further management of small bowel obstruction.  The patient will be under the care and responsibility of the ER provider until arrival to [facility].  Author: Hannah Beat, MD 09/06/2023  Check [email or web address] for on-call coverage.  Nursing staff, Please call [facility] Admits & Consults System-Wide number on Amion as soon as patient's arrival, so appropriate admitting provider can evaluate the pt.

## 2023-09-06 NOTE — ED Notes (Signed)
Patient refused to change into gown. 

## 2023-09-06 NOTE — ED Notes (Signed)
 [facility] at bedside

## 2023-09-06 NOTE — ED Triage Notes (Signed)
 No drainage/outout from ostomy. Last emptied in AM before church. Discomfort Report not passing gas since AM

## 2023-09-06 NOTE — ED Provider Notes (Signed)
 [place] EMERGENCY DEPARTMENT AT [facility] Provider Note  CSN: [id number] Arrival date & time: 09/06/23 1746  Chief Complaint(s) Constipation  HPI Joshua Riley is a 88 y.o. male with past medical history as below, significant for ESRD on HD Monday Wednesday Friday, ileostomy, ulcerative colitis, A-fib on Eliquis who presents to the ED with complaint of abdominal pain, constipation  Prior abdominal surgeries, prior bowel obstruction.  He has not had bowel movement since this morning, no drainage from his ostomy since this morning.  He has not noticed any blood or black stool in his ostomy.  No change to the appearance of his ostomy.  He is having some diffuse abdominal pain, no nausea or vomiting.  No fevers or chills.  Normal state of health prior to onset the symptoms.  Last dialysis was on Friday  Past Medical History Past Medical History:  Diagnosis Date   A-fib (HCC)    Anemia    Arthritis    Cancer (HCC)    Basal cell   COVID-19    2021   Dysrhythmia    Afib-controlled on eliquis   ESRD (end stage renal disease) (HCC) 10/22/2021   Glaucoma 11/18/2021   History of DVT (deep vein thrombosis)    Hydronephrosis    managed wtih a PCN   Idiopathic neuropathy 10/22/2021   lyrica    Ileostomy in place [facility])    Obstructive uropathy    With chronic left nephrostomy   Old retinal detachment, total or subtotal    Orthostatic hypotension 10/22/2021   Sleep apnea    does not need a machine   Stroke (HCC)    Ulcerative colitis (HCC)    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   Patient Active Problem List   Diagnosis Date Noted   Abscess of abdominal cavity (HCC) 07/17/2023   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   Anastomotic leak of intestine 07/14/2023   History of COPD 07/08/2023   Chronic diastolic CHF (congestive heart failure) (HCC) 07/08/2023   History of anemia due to chronic kidney disease 07/08/2023   Abscess of groin, left 07/07/2023    Macrocytic anemia 04/08/2023   Depression 04/08/2023   Perirectal abscess 03/23/2023   Infection due to ESBL-producing Escherichia coli 03/23/2023   DOE (dyspnea on exertion) 07/22/2022   Coag negative Staphylococcus bacteremia 06/06/2022   History of sepsis 06/02/2022   Ulcerative colitis (HCC) 06/02/2022   End-stage renal disease on hemodialysis (HCC) 05/06/2022   SBO (small bowel obstruction) (HCC) 11/18/2021   Glaucoma 11/18/2021   Atrial fibrillation, persistent (HCC) 11/18/2021   Orthostatic hypotension 10/22/2021   Idiopathic neuropathy 10/22/2021   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   Nephrostomy present (HCC) 10/22/2021   Home Medication(s) Prior to Admission medications   Medication Sig Start Date End Date Taking? Authorizing Provider  Albuterol Sulfate 2.5 MG/0.5ML NEBU Inhale 0.5 mLs (2.5 mg total) into the lungs every 6 (six) hours. 07/15/23   Glenford Bayley, NP  B Complex-C-Folic Acid (RENAL) 1 MG CAPS Take by mouth daily at 6 (six) AM. 08/08/23   [provider]  BIOTIN PO Take 1 capsule by mouth daily.    [provider]  budesonide (PULMICORT) 0.5 MG/2ML nebulizer solution TAKE 2 ML (0.5 MG TOTAL) BY NEBULIZATION TWICE A DAY 07/23/23   Glenford Bayley, NP  Cyanocobalamin (B-12) 1000 MCG SUBL Place 1 tablet under the tongue daily at 6 (six) AM. 04/08/23   Lula Olszewski, MD  dorzolamide (TRUSOPT) 2 %  ophthalmic solution Place 1 drop into both eyes 2 (two) times daily. 06/17/23   [provider]  doxycycline (VIBRAMYCIN) 100 MG capsule Take 100 mg by mouth 2 (two) times daily. 08/24/23   [provider]  ELIQUIS 2.5 MG TABS tablet Take 1 tablet (2.5 mg total) by mouth 2 (two) times daily. 08/07/22   Meriam Sprague, MD  ferrous sulfate 325 (65 FE) MG tablet Take 1 tablet (325 mg total) by mouth daily with breakfast. 07/21/23   Regalado, Belkys A, MD  fluconazole (DIFLUCAN) 100 MG tablet Take 1 tablet (100 mg total) by mouth daily.  07/21/23   Regalado, Belkys A, MD  folic acid (FOLVITE) 1 MG tablet Take 1 tablet (1 mg total) by mouth daily. 07/21/23   Regalado, Belkys A, MD  gabapentin (NEURONTIN) 300 MG capsule Take 300 mg by mouth at bedtime.    [provider]  latanoprost (XALATAN) 0.005 % ophthalmic solution Place 1 drop into the right eye at bedtime.    [provider]  LOKELMA 10 g PACK packet daily. 08/08/23   [provider]  magnesium oxide (MAG-OX) 400 (240 Mg) MG tablet Take 1 tablet (400 mg total) by mouth 2 (two) times daily. 07/21/23   Regalado, Belkys A, MD  melatonin 3 MG TABS tablet Take 1 tablet (3 mg total) by mouth at bedtime as needed (insomnia). 07/21/23   Regalado, Belkys A, MD  midodrine (PROAMATINE) 10 MG tablet Take 1 tablet (10 mg total) by mouth 3 (three) times daily with meals. 07/21/23   Regalado, Belkys A, MD  Omega-3 Fatty Acids (OMEGA-3 FISH OIL) 500 MG CAPS Take 1 capsule by mouth daily.    [provider]  pantoprazole (PROTONIX) 40 MG tablet Take 1 tablet (40 mg total) by mouth 2 (two) times daily. 07/21/23   Regalado, Belkys A, MD  ramelteon (ROZEREM) 8 MG tablet TAKE 1 TABLET BY MOUTH AT BEDTIME. 07/09/23   Shelva Majestic, MD  riboflavin (VITAMIN B-2) 100 MG TABS tablet Take 100 mg by mouth daily.    [provider]  sertraline (ZOLOFT) 25 MG tablet TAKE 1 TABLET (25 MG TOTAL) BY MOUTH DAILY. 07/25/23   Lula Olszewski, MD  sevelamer carbonate (RENVELA) 2.4 g PACK Take 2.4 g by mouth 3 (three) times daily with meals. 07/21/23   Regalado, Belkys A, MD  sucralfate (CARAFATE) 1 g tablet Take 1 g by mouth 4 (four) times daily.    [provider]  timolol (TIMOPTIC) 0.5 % ophthalmic solution Place 1 drop into both eyes daily.    [provider]  valACYclovir (VALTREX) 500 MG tablet Take 1 tablet (500 mg total) by mouth daily. 08/20/23   Shelva Majestic, MD  Monte Fantasia INHUB 250-50 MCG/ACT AEPB Inhale 1 puff into the lungs 2 (two) times daily.  07/04/23   [provider]  Past Surgical History Past Surgical History:  Procedure Laterality Date   BASAL CELL CARCINOMA EXCISION     10/23   COLON SURGERY     creation of j pouch     and subsequent takedown of j pouch   EYE SURGERY     IR NEPHROSTOMY EXCHANGE LEFT  12/10/2021   IR NEPHROSTOMY EXCHANGE LEFT  04/22/2022   IR NEPHROSTOMY EXCHANGE LEFT  07/29/2022   IR NEPHROSTOMY EXCHANGE LEFT  10/28/2022   IR NEPHROSTOMY EXCHANGE LEFT  02/05/2023   IR NEPHROSTOMY EXCHANGE LEFT  05/07/2023   IR NEPHROSTOMY EXCHANGE LEFT  06/25/2023   IR NEPHROSTOMY EXCHANGE LEFT  07/17/2023   LEFT HEART CATH AND CORONARY ANGIOGRAPHY N/A 07/22/2022   Procedure: LEFT HEART CATH AND CORONARY ANGIOGRAPHY;  Surgeon: [place], Peter M, MD;  Location: [facility] INVASIVE CV LAB;  Service: Cardiovascular;  Laterality: N/A;   REVISION OF ARTERIOVENOUS GORETEX GRAFT Left 05/06/2022   Procedure: REDO LEFT THIGH ARTERIOVENOUS 4-7 MM GORETEX GRAFT;  Surgeon: Chuck Hint, MD;  Location: [facility] OR;  Service: Vascular;  Laterality: Left;   SMALL INTESTINE SURGERY     TOTAL COLECTOMY     Family History Family History  Problem Relation Age of Onset   Stroke Mother    Cancer Father    Esophageal cancer Brother     Social History Social History   Tobacco Use   Smoking status: Former    Current packs/day: 0.00    Average packs/day: 2.0 packs/day for 6.0 years (12.0 ttl pk-yrs)    Types: Cigarettes    Start date: 15    Quit date: 1985    Years since quitting: 40.1    Passive exposure: Never   Smokeless tobacco: Never  Vaping Use   Vaping status: Never Used  Substance Use Topics   Alcohol use: Yes    Alcohol/week: 5.0 standard drinks of alcohol    Types: 5 Shots of liquor per week    Comment: socially   Drug use: Never   Allergies Cephalosporins, Ciprofloxacin, and  Baclofen  Review of Systems A thorough review of systems was obtained and all systems are negative except as noted in the HPI and PMH.   Physical Exam Vital Signs  I have reviewed the triage vital signs BP (!) 106/58   Pulse 60   Temp (!) 97.5 F (36.4 C)   Resp 16   SpO2 100%  Physical Exam Vitals and nursing note reviewed.  Constitutional:      General: He is not in acute distress.    Appearance: Normal appearance. He is well-developed. He is not ill-appearing.  HENT:     Head: Normocephalic and atraumatic.     Right Ear: External ear normal.     Left Ear: External ear normal.     Nose: Nose normal.     Mouth/Throat:     Mouth: Mucous membranes are moist.  Eyes:     General: No scleral icterus.       Right eye: No discharge.        Left eye: No discharge.  Cardiovascular:     Rate and Rhythm: Normal rate.  Pulmonary:     Effort: Pulmonary effort is normal. No respiratory distress.     Breath sounds: No stridor. No wheezing.  Abdominal:     General: Abdomen is flat. There is no distension.     Palpations: Abdomen is soft.     Tenderness: There is generalized abdominal tenderness. There is no guarding.  Musculoskeletal:        General: No deformity.     Cervical back: No rigidity.  Skin:    General: Skin is warm and dry.     Coloration: Skin is not cyanotic, jaundiced or pale.  Neurological:     Mental Status: He is alert and oriented to person, place, and time.     GCS: GCS eye subscore is 4. GCS verbal subscore is 5. GCS motor subscore is 6.  Psychiatric:        Speech: Speech normal.        Behavior: Behavior normal. Behavior is cooperative.     ED Results and Treatments Labs (all labs ordered are listed, but only abnormal results are displayed) Labs Reviewed  CBC WITH DIFFERENTIAL/PLATELET - Abnormal; Notable for the following components:      Result Value   WBC 11.2 (*)    RBC 2.94 (*)    Hemoglobin 11.3 (*)    HCT 34.0 (*)    MCV 115.6 (*)     MCH 38.4 (*)    RDW 17.6 (*)    Neutro Abs 9.3 (*)    All other components within normal limits  COMPREHENSIVE METABOLIC PANEL - Abnormal; Notable for the following components:   Potassium 5.8 (*)    Chloride 94 (*)    Glucose, Bld 124 (*)    BUN 86 (*)    Creatinine, Ser 9.32 (*)    AST 10 (*)    GFR, Estimated 5 (*)    Anion gap 20 (*)    All other components within normal limits  LIPASE, BLOOD  URINALYSIS, ROUTINE W REFLEX MICROSCOPIC                                                                                                                          Radiology CT ABDOMEN PELVIS W CONTRAST Result Date: 09/06/2023 CLINICAL DATA:  Acute nonlocalized abdominal pain. Query small-bowel obstruction. EXAM: CT ABDOMEN AND PELVIS WITH CONTRAST TECHNIQUE: Multidetector CT imaging of the abdomen and pelvis was performed using the standard protocol following bolus administration of intravenous contrast. RADIATION DOSE REDUCTION: This exam was performed according to the departmental dose-optimization program which includes automated exposure control, adjustment of the mA and/or kV according to patient size and/or use of iterative reconstruction technique. CONTRAST:  OMNIPAQUE IOHEXOL 300 MG/ML  SOLN COMPARISON:  CT abdomen and pelvis 07/31/2023 FINDINGS: Lower chest: No acute abnormality. Hepatobiliary: Hepatic steatosis. Cholelithiasis without evidence of acute cholecystitis. No biliary dilation. Pancreas: Unremarkable. Spleen: Unremarkable. Adrenals/Urinary Tract: Unremarkable adrenal glands. Atrophic native kidneys. Percutaneous nephrostomy tube in the left kidney. Moderate hydroureter of the distal right ureter. Irregular bladder wall thickening greater on the left. The bladder is nondistended. Stomach/Bowel: Distended stomach. Distended small bowel in the left hemiabdomen with abrupt transition point distal to an area of fecalized small bowel (circa series 5/image 63). No bowel wall thickening  or evidence of bowel ischemia. The small bowel downstream from the area  of obstruction extending to the right abdominal ileostomy is decompressed. Postoperative change of colectomy. Vascular/Lymphatic: Aortic atherosclerosis. No enlarged abdominal or pelvic lymph nodes. Reproductive: Penile prosthesis.  No acute abnormality. Other: Interval removal of the presacral drain. The presacral fluid collection is unchanged measuring 3.7 x 5.5 cm (using similar measuring technique). Mild adjacent fat stranding. Small amount of free fluid superior to the bladder. No free intraperitoneal air. Musculoskeletal: No acute fracture. 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 IMPRESSION: 1. Small-bowel obstruction with abrupt transition point in the left anterior abdomen. 2. Irregular bladder wall thickening on the left. Moderate right hydroureter distally. Query ureteral obstruction from bladder mass. Cystoscopy is recommended if not performed previously. 3. Percutaneous nephrostomy tube in the left kidney. Interval decompression of the left hydroureter. 4. Interval removal of the presacral drain. The presacral fluid collection is unchanged measuring 3.7 x 5.5 cm (using similar measuring technique). 5. Hepatic steatosis. 6. Cholelithiasis without evidence of acute cholecystitis. 7. Aortic Atherosclerosis (ICD10-I70.0). Electronically Signed   By: Minerva Fester M.D.   On: 09/06/2023 20:06    Pertinent labs & imaging results that were available during my care of the patient were reviewed by me and considered in my medical decision making (see MDM for details).  Medications Ordered in ED Medications  morphine (PF) 2 MG/ML injection 2 mg (2 mg Intravenous Given 09/06/23 1920)  ondansetron (ZOFRAN) injection 4 mg (4 mg Intravenous Given 09/06/23 1919)  iohexol (OMNIPAQUE) 300 MG/ML solution 100 mL (100 mLs Intravenous Contrast Given 09/06/23 1931)  morphine (PF) 2 MG/ML injection 2 mg (2 mg Intravenous Given 09/06/23  2145)  sodium zirconium cyclosilicate (LOKELMA) packet 10 g (10 g Oral Given 09/06/23 2145)                                                                                                                                     Procedures Procedures  (including critical care time)  Medical Decision Making / ED Course    Medical Decision Making:    Axiel Fjeld is a 88 y.o. male with past medical history as below, significant for ESRD on HD Monday Wednesday Friday, ileostomy, ulcerative colitis, A-fib on Eliquis who presents to the ED with complaint of abdominal pain, constipation. The complaint involves an extensive differential diagnosis and also carries with it a high risk of complications and morbidity.  Serious etiology was considered. Ddx includes but is not limited to: Differential diagnosis includes but is not exclusive to acute cholecystitis, intrathoracic causes for epigastric abdominal pain, gastritis, duodenitis, pancreatitis, small bowel or large bowel obstruction, abdominal aortic aneurysm, hernia, gastritis, etc.   Complete initial physical exam performed, notably the patient was in no acute distress, abdomen nonperitoneal.    Reviewed and confirmed nursing documentation for past medical history, family history, social history.  Vital signs reviewed.    Clinical Course as of 09/06/23 2201  Sun Sep 06, 2023  1826 ESRD  MWF, last F (no missed sessions x2 wks) [SG]  2018 CT on my interpretation concerning for SBO. Radiology concerned for SBO as well. Will d/w gen surg, admit [facility] [SG]  2120 Potassium(!): 5.8 He is due for HD tomorrow, give lokelma.  [SG]  2150 Pt feeling better on recheck, no nausea. Abd not peritoneal. Pain well controlled. He is agreeable to admission.   Spoke w/ Dr [facility], accepts pt for admit @ [facility] [SG]    Clinical Course User Index [SG] Sloan Leiter, DO    Brief summary: 88 year old male with history as above here with abdominal pain, reduced  ostomy output.  He is diffusely tender, nonperitoneal to his abdomen.  Ostomy is pink.  Will get screening labs, check CT imaging to rule out obstruction or other acute intra-abdominal process. -CT revealing for small bowel obstruction.  Also irregularity to the bladder, pos bladder mass; would likely benefit from urology evaluation/cystoscopy.  Sacral fluid collection is also still present, unchanged from prior. -General surgery was notified of admission.  Discussed with [facility] accepts patient admission.  Will likely need dialysis tomorrow per his regular schedule   Admit [facility]            Additional history obtained: -Additional history obtained from family -External records from outside source obtained and reviewed including: Chart review including previous notes, labs, imaging, consultation notes including  Prior admission, prior labs and imaging, home medications   Lab Tests: -I ordered, reviewed, and interpreted labs.   The pertinent results include:   Labs Reviewed  CBC WITH DIFFERENTIAL/PLATELET - Abnormal; Notable for the following components:      Result Value   WBC 11.2 (*)    RBC 2.94 (*)    Hemoglobin 11.3 (*)    HCT 34.0 (*)    MCV 115.6 (*)    MCH 38.4 (*)    RDW 17.6 (*)    Neutro Abs 9.3 (*)    All other components within normal limits  COMPREHENSIVE METABOLIC PANEL - Abnormal; Notable for the following components:   Potassium 5.8 (*)    Chloride 94 (*)    Glucose, Bld 124 (*)    BUN 86 (*)    Creatinine, Ser 9.32 (*)    AST 10 (*)    GFR, Estimated 5 (*)    Anion gap 20 (*)    All other components within normal limits  LIPASE, BLOOD  URINALYSIS, ROUTINE W REFLEX MICROSCOPIC    Notable for K+  EKG   EKG Interpretation Date/Time:    Ventricular Rate:    PR Interval:    QRS Duration:    QT Interval:    QTC Calculation:   R Axis:      Text Interpretation:           Imaging Studies ordered: I ordered imaging studies including CTAP I  independently visualized the following imaging with scope of interpretation limited to determining acute life threatening conditions related to emergency care; findings noted above I independently visualized and interpreted imaging. I agree with the radiologist interpretation   Medicines ordered and prescription drug management: Meds ordered this encounter  Medications   morphine (PF) 2 MG/ML injection 2 mg   ondansetron (ZOFRAN) injection 4 mg   iohexol (OMNIPAQUE) 300 MG/ML solution 100 mL   morphine (PF) 2 MG/ML injection 2 mg   sodium zirconium cyclosilicate (LOKELMA) packet 10 g    -I have reviewed the patients home medicines and have made adjustments as needed  Consultations Obtained: I requested consultation with the gen surg, [facility],  and discussed lab and imaging findings as well as pertinent plan - they recommend: admit   Cardiac Monitoring: Continuous pulse oximetry interpreted by myself, 99% on RA.    Social Determinants of Health:  Diagnosis or treatment significantly limited by social determinants of health: former smoker   Reevaluation: After the interventions noted above, I reevaluated the patient and found that they have improved  Co morbidities that complicate the patient evaluation  Past Medical History:  Diagnosis Date   A-fib (HCC)    Anemia    Arthritis    Cancer (HCC)    Basal cell   COVID-19    2021   Dysrhythmia    Afib-controlled on eliquis   ESRD (end stage renal disease) (HCC) 10/22/2021   Glaucoma 11/18/2021   History of DVT (deep vein thrombosis)    Hydronephrosis    managed wtih a PCN   Idiopathic neuropathy 10/22/2021   lyrica    Ileostomy in place [facility])    Obstructive uropathy    With chronic left nephrostomy   Old retinal detachment, total or subtotal    Orthostatic hypotension 10/22/2021   Sleep apnea    does not need a machine   Stroke (HCC)    Ulcerative colitis (HCC)    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 during  surgery      Dispostion: Disposition decision including need for hospitalization was considered, and patient admitted to the hospital.    Final Clinical Impression(s) / ED Diagnoses Final diagnoses:  SBO (small bowel obstruction) (HCC)  Ileostomy present (HCC)  ESRD on hemodialysis (HCC)  Hyperkalemia  Hepatic steatosis  Structural abnormality of bladder        Sloan Leiter, DO 09/06/23 2201

## 2023-09-06 NOTE — ED Notes (Signed)
 Pt aware of the need for a urine... Unable to currently provide the sample.Joshua KitchenMarland Riley

## 2023-09-07 ENCOUNTER — Encounter (HOSPITAL_COMMUNITY): Payer: Self-pay | Admitting: Family Medicine

## 2023-09-07 ENCOUNTER — Inpatient Hospital Stay (HOSPITAL_COMMUNITY)

## 2023-09-07 ENCOUNTER — Encounter: Payer: Self-pay | Admitting: Internal Medicine

## 2023-09-07 DIAGNOSIS — I4819 Other persistent atrial fibrillation: Secondary | ICD-10-CM

## 2023-09-07 DIAGNOSIS — N189 Chronic kidney disease, unspecified: Secondary | ICD-10-CM

## 2023-09-07 DIAGNOSIS — Z862 Personal history of diseases of the blood and blood-forming organs and certain disorders involving the immune mechanism: Secondary | ICD-10-CM | POA: Diagnosis not present

## 2023-09-07 DIAGNOSIS — K56609 Unspecified intestinal obstruction, unspecified as to partial versus complete obstruction: Secondary | ICD-10-CM | POA: Diagnosis not present

## 2023-09-07 LAB — HEPARIN LEVEL (UNFRACTIONATED): Heparin Unfractionated: 1.01 [IU]/mL — ABNORMAL HIGH (ref 0.30–0.70)

## 2023-09-07 LAB — CBC WITH DIFFERENTIAL/PLATELET
Abs Immature Granulocytes: 0.04 10*3/uL (ref 0.00–0.07)
Basophils Absolute: 0 10*3/uL (ref 0.0–0.1)
Basophils Relative: 1 %
Eosinophils Absolute: 0.1 10*3/uL (ref 0.0–0.5)
Eosinophils Relative: 2 %
HCT: 33.4 % — ABNORMAL LOW (ref 39.0–52.0)
Hemoglobin: 11.2 g/dL — ABNORMAL LOW (ref 13.0–17.0)
Immature Granulocytes: 1 %
Lymphocytes Relative: 8 %
Lymphs Abs: 0.6 10*3/uL — ABNORMAL LOW (ref 0.7–4.0)
MCH: 38.1 pg — ABNORMAL HIGH (ref 26.0–34.0)
MCHC: 33.5 g/dL (ref 30.0–36.0)
MCV: 113.6 fL — ABNORMAL HIGH (ref 80.0–100.0)
Monocytes Absolute: 0.6 10*3/uL (ref 0.1–1.0)
Monocytes Relative: 8 %
Neutro Abs: 5.8 10*3/uL (ref 1.7–7.7)
Neutrophils Relative %: 80 %
Platelets: 188 10*3/uL (ref 150–400)
RBC: 2.94 MIL/uL — ABNORMAL LOW (ref 4.22–5.81)
RDW: 17.6 % — ABNORMAL HIGH (ref 11.5–15.5)
Smear Review: ADEQUATE
WBC: 7.2 10*3/uL (ref 4.0–10.5)
nRBC: 0 % (ref 0.0–0.2)

## 2023-09-07 LAB — HEPATIC FUNCTION PANEL
ALT: 15 U/L (ref 0–44)
AST: 16 U/L (ref 15–41)
Albumin: 3.7 g/dL (ref 3.5–5.0)
Alkaline Phosphatase: 96 U/L (ref 38–126)
Bilirubin, Direct: 0.1 mg/dL (ref 0.0–0.2)
Indirect Bilirubin: 0.9 mg/dL (ref 0.3–0.9)
Total Bilirubin: 1 mg/dL (ref 0.0–1.2)
Total Protein: 7.1 g/dL (ref 6.5–8.1)

## 2023-09-07 LAB — TYPE AND SCREEN
ABO/RH(D): B NEG
Antibody Screen: NEGATIVE

## 2023-09-07 LAB — BASIC METABOLIC PANEL
Anion gap: 22 — ABNORMAL HIGH (ref 5–15)
BUN: 90 mg/dL — ABNORMAL HIGH (ref 8–23)
CO2: 22 mmol/L (ref 22–32)
Calcium: 8.8 mg/dL — ABNORMAL LOW (ref 8.9–10.3)
Chloride: 93 mmol/L — ABNORMAL LOW (ref 98–111)
Creatinine, Ser: 9.58 mg/dL — ABNORMAL HIGH (ref 0.61–1.24)
GFR, Estimated: 5 mL/min — ABNORMAL LOW (ref >60)
Glucose, Bld: 95 mg/dL (ref 70–99)
Potassium: 6 mmol/L — ABNORMAL HIGH (ref 3.5–5.1)
Sodium: 137 mmol/L (ref 135–145)

## 2023-09-07 LAB — VITAMIN B12: Vitamin B-12: 4157 pg/mL — ABNORMAL HIGH (ref 180–914)

## 2023-09-07 LAB — HEPATITIS B SURFACE ANTIGEN: Hepatitis B Surface Ag: NONREACTIVE

## 2023-09-07 LAB — APTT: aPTT: 30 s (ref 24–36)

## 2023-09-07 LAB — FOLATE: Folate: >40 ng/mL (ref >5.9)

## 2023-09-07 MED ORDER — SODIUM ZIRCONIUM CYCLOSILICATE 10 G PO PACK
10.0000 g | PACK | Freq: Once | ORAL | Status: AC
  Administered 2023-09-07: 10 g via ORAL
  Filled 2023-09-07: qty 1

## 2023-09-07 MED ORDER — LATANOPROST 0.005 % OP SOLN
1.0000 [drp] | Freq: Every day | OPHTHALMIC | Status: DC
  Administered 2023-09-08: 1 [drp] via OPHTHALMIC
  Filled 2023-09-07: qty 2.5

## 2023-09-07 MED ORDER — CHLORHEXIDINE GLUCONATE CLOTH 2 % EX PADS
6.0000 | MEDICATED_PAD | Freq: Every day | CUTANEOUS | Status: DC
  Administered 2023-09-08: 6 via TOPICAL

## 2023-09-07 MED ORDER — FENTANYL CITRATE PF 50 MCG/ML IJ SOSY
25.0000 ug | PREFILLED_SYRINGE | INTRAMUSCULAR | Status: DC | PRN
  Administered 2023-09-07: 25 ug via INTRAVENOUS
  Filled 2023-09-07: qty 1

## 2023-09-07 MED ORDER — SODIUM CHLORIDE 0.9 % IV BOLUS
500.0000 mL | Freq: Once | INTRAVENOUS | Status: AC
  Administered 2023-09-07: 500 mL via INTRAVENOUS

## 2023-09-07 MED ORDER — MOMETASONE FURO-FORMOTEROL FUM 200-5 MCG/ACT IN AERO
2.0000 | INHALATION_SPRAY | Freq: Two times a day (BID) | RESPIRATORY_TRACT | Status: DC
  Administered 2023-09-07 – 2023-09-08 (×3): 2 via RESPIRATORY_TRACT
  Filled 2023-09-07: qty 8.8

## 2023-09-07 MED ORDER — DORZOLAMIDE HCL 2 % OP SOLN
1.0000 [drp] | Freq: Two times a day (BID) | OPHTHALMIC | Status: DC
  Administered 2023-09-07 – 2023-09-08 (×5): 1 [drp] via OPHTHALMIC
  Filled 2023-09-07: qty 10

## 2023-09-07 MED ORDER — MIDODRINE HCL 5 MG PO TABS
5.0000 mg | ORAL_TABLET | Freq: Once | ORAL | Status: AC
  Administered 2023-09-07: 5 mg via ORAL
  Filled 2023-09-07: qty 1

## 2023-09-07 MED ORDER — APIXABAN 2.5 MG PO TABS
2.5000 mg | ORAL_TABLET | Freq: Two times a day (BID) | ORAL | Status: DC
  Administered 2023-09-07 – 2023-09-08 (×3): 2.5 mg via ORAL
  Filled 2023-09-07 (×3): qty 1

## 2023-09-07 MED ORDER — TIMOLOL MALEATE 0.5 % OP SOLN
1.0000 [drp] | Freq: Every day | OPHTHALMIC | Status: DC
  Administered 2023-09-07 – 2023-09-08 (×2): 1 [drp] via OPHTHALMIC
  Filled 2023-09-07: qty 5

## 2023-09-07 MED ORDER — ALBUTEROL SULFATE (2.5 MG/3ML) 0.083% IN NEBU
3.0000 mL | INHALATION_SOLUTION | Freq: Four times a day (QID) | RESPIRATORY_TRACT | Status: DC
  Administered 2023-09-07: 3 mL via RESPIRATORY_TRACT
  Filled 2023-09-07: qty 3

## 2023-09-07 MED ORDER — SODIUM CHLORIDE 0.9 % IV BOLUS
250.0000 mL | Freq: Once | INTRAVENOUS | Status: AC
  Administered 2023-09-07: 250 mL via INTRAVENOUS

## 2023-09-07 MED ORDER — HEPARIN (PORCINE) 25000 UT/250ML-% IV SOLN
1000.0000 [IU]/h | INTRAVENOUS | Status: DC
  Administered 2023-09-07: 1000 [IU]/h via INTRAVENOUS
  Filled 2023-09-07: qty 250

## 2023-09-07 MED ORDER — ALBUTEROL SULFATE (2.5 MG/3ML) 0.083% IN NEBU
3.0000 mL | INHALATION_SOLUTION | Freq: Four times a day (QID) | RESPIRATORY_TRACT | Status: DC | PRN
Start: 2023-09-07 — End: 2023-09-09

## 2023-09-07 NOTE — Consult Note (Signed)
 Jeanmarie Hubert 1935/08/21  [id number].    Requesting MD: Midge Minium Chief Complaint/Reason for Consult: abdominal pain  HPI:  88 yo male had [place] yesterday in the morning. He began having abdominal pain in the afternoon. Pain is right sided. He denies nausea or vomiting now. He is having ostomy output this morning  ROS: Review of Systems  Constitutional: Negative.   HENT: Negative.    Eyes: Negative.   Respiratory: Negative.    Cardiovascular: Negative.   Gastrointestinal:  Positive for abdominal pain.  Genitourinary: Negative.   Musculoskeletal: Negative.   Skin: Negative.   Neurological: Negative.   Endo/Heme/Allergies: Negative.   Psychiatric/Behavioral: Negative.      Family History  Problem Relation Age of Onset   Stroke Mother    Cancer Father    Esophageal cancer Brother     Past Medical History:  Diagnosis Date   A-fib (HCC)    Anemia    Arthritis    Cancer (HCC)    Basal cell   COVID-19    2021   Dysrhythmia    Afib-controlled on eliquis   ESRD (end stage renal disease) (HCC) 10/22/2021   Glaucoma 11/18/2021   History of DVT (deep vein thrombosis)    Hydronephrosis    managed wtih a PCN   Idiopathic neuropathy 10/22/2021   lyrica    Ileostomy in place [facility])    Obstructive uropathy    With chronic left nephrostomy   Old retinal detachment, total or subtotal    Orthostatic hypotension 10/22/2021   Sleep apnea    does not need a machine   Stroke (HCC)    Ulcerative colitis (HCC)    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    Past Surgical History:  Procedure Laterality Date   BASAL CELL CARCINOMA EXCISION     10/23   COLON SURGERY     creation of j pouch     and subsequent takedown of j pouch   EYE SURGERY     IR NEPHROSTOMY EXCHANGE LEFT  12/10/2021   IR NEPHROSTOMY EXCHANGE LEFT  04/22/2022   IR NEPHROSTOMY EXCHANGE LEFT  07/29/2022   IR NEPHROSTOMY EXCHANGE LEFT  10/28/2022   IR NEPHROSTOMY  EXCHANGE LEFT  02/05/2023   IR NEPHROSTOMY EXCHANGE LEFT  05/07/2023   IR NEPHROSTOMY EXCHANGE LEFT  06/25/2023   IR NEPHROSTOMY EXCHANGE LEFT  07/17/2023   LEFT HEART CATH AND CORONARY ANGIOGRAPHY N/A 07/22/2022   Procedure: LEFT HEART CATH AND CORONARY ANGIOGRAPHY;  Surgeon: Swaziland, Peter M, MD;  Location: [facility] INVASIVE CV LAB;  Service: Cardiovascular;  Laterality: N/A;   REVISION OF ARTERIOVENOUS GORETEX GRAFT Left 05/06/2022   Procedure: REDO LEFT THIGH ARTERIOVENOUS 4-7 MM GORETEX GRAFT;  Surgeon: Chuck Hint, MD;  Location: [facility] OR;  Service: Vascular;  Laterality: Left;   SMALL INTESTINE SURGERY     TOTAL COLECTOMY      Social History:  reports that he quit smoking about 40 years ago. His smoking use included cigarettes. He started smoking about 46 years ago. He has a 12 pack-year smoking history. He has never been exposed to tobacco smoke. He has never used smokeless tobacco. He reports current alcohol use of about 5.0 standard drinks of alcohol per week. He reports that he does not use drugs.  Allergies:  Allergies  Allergen Reactions   Cephalosporins Rash   Ciprofloxacin Itching and Rash   Baclofen Other (See Comments)    Altered mental status, after accidental  overdose      Medications Prior to Admission  Medication Sig Dispense Refill   Albuterol Sulfate 2.5 MG/0.5ML NEBU Inhale 0.5 mLs (2.5 mg total) into the lungs every 6 (six) hours. 60 mL 5   B Complex-C-Folic Acid (RENAL) 1 MG CAPS Take by mouth daily at 6 (six) AM.     BIOTIN PO Take 1 capsule by mouth daily.     budesonide (PULMICORT) 0.5 MG/2ML nebulizer solution TAKE 2 ML (0.5 MG TOTAL) BY NEBULIZATION TWICE A DAY 360 mL 1   Cyanocobalamin (B-12) 1000 MCG SUBL Place 1 tablet under the tongue daily at 6 (six) AM. 90 tablet 3   dorzolamide (TRUSOPT) 2 % ophthalmic solution Place 1 drop into both eyes 2 (two) times daily.     doxycycline (VIBRAMYCIN) 100 MG capsule Take 100 mg by mouth 2 (two) times daily.      ELIQUIS 2.5 MG TABS tablet Take 1 tablet (2.5 mg total) by mouth 2 (two) times daily. 60 tablet 11   ferrous sulfate 325 (65 FE) MG tablet Take 1 tablet (325 mg total) by mouth daily with breakfast. 30 tablet 3   fluconazole (DIFLUCAN) 100 MG tablet Take 1 tablet (100 mg total) by mouth daily. 4 tablet 0   folic acid (FOLVITE) 1 MG tablet Take 1 tablet (1 mg total) by mouth daily. 30 tablet 0   gabapentin (NEURONTIN) 300 MG capsule Take 300 mg by mouth at bedtime.     latanoprost (XALATAN) 0.005 % ophthalmic solution Place 1 drop into the right eye at bedtime.     LOKELMA 10 g PACK packet daily.     magnesium oxide (MAG-OX) 400 (240 Mg) MG tablet Take 1 tablet (400 mg total) by mouth 2 (two) times daily. 60 tablet 0   melatonin 3 MG TABS tablet Take 1 tablet (3 mg total) by mouth at bedtime as needed (insomnia). 30 tablet 0   midodrine (PROAMATINE) 10 MG tablet Take 1 tablet (10 mg total) by mouth 3 (three) times daily with meals. 90 tablet 0   Omega-3 Fatty Acids (OMEGA-3 FISH OIL) 500 MG CAPS Take 1 capsule by mouth daily.     pantoprazole (PROTONIX) 40 MG tablet Take 1 tablet (40 mg total) by mouth 2 (two) times daily. 60 tablet 0   ramelteon (ROZEREM) 8 MG tablet TAKE 1 TABLET BY MOUTH AT BEDTIME. 90 tablet 2   riboflavin (VITAMIN B-2) 100 MG TABS tablet Take 100 mg by mouth daily.     sertraline (ZOLOFT) 25 MG tablet TAKE 1 TABLET (25 MG TOTAL) BY MOUTH DAILY. 90 tablet 2   sevelamer carbonate (RENVELA) 2.4 g PACK Take 2.4 g by mouth 3 (three) times daily with meals. 90 each 0   sucralfate (CARAFATE) 1 g tablet Take 1 g by mouth 4 (four) times daily.     timolol (TIMOPTIC) 0.5 % ophthalmic solution Place 1 drop into both eyes daily.     valACYclovir (VALTREX) 500 MG tablet Take 1 tablet (500 mg total) by mouth daily. 7 tablet 0   WIXELA INHUB 250-50 MCG/ACT AEPB Inhale 1 puff into the lungs 2 (two) times daily.      Physical Exam: Blood pressure 104/61, pulse 70, temperature 97.6 F (36.4  C), temperature source Oral, resp. rate 17, height 5\' 8"  (1.727 m), weight 71.7 kg, SpO2 100%. Gen: NAD Resp: nonlabored CV: irreg irreg Abd: soft, right sided ostomy with moderate thick liquid stools Neuro: AOx4  Results for orders placed or performed during  the hospital encounter of 09/06/23 (from the past 48 hours)  CBC with Differential     Status: Abnormal   Collection Time: 09/06/23  6:08 PM  Result Value Ref Range   WBC 11.2 (H) 4.0 - 10.5 K/uL   RBC 2.94 (L) 4.22 - 5.81 MIL/uL   Hemoglobin 11.3 (L) 13.0 - 17.0 g/dL   HCT 16.1 (L) 09.6 - 04.5 %   MCV 115.6 (H) 80.0 - 100.0 fL   MCH 38.4 (H) 26.0 - 34.0 pg   MCHC 33.2 30.0 - 36.0 g/dL   RDW 40.9 (H) 81.1 - 91.4 %   Platelets 197 150 - 400 K/uL   nRBC 0.0 0.0 - 0.2 %   Neutrophils Relative % 83 %   Neutro Abs 9.3 (H) 1.7 - 7.7 K/uL   Lymphocytes Relative 6 %   Lymphs Abs 0.7 0.7 - 4.0 K/uL   Monocytes Relative 8 %   Monocytes Absolute 0.9 0.1 - 1.0 K/uL   Eosinophils Relative 2 %   Eosinophils Absolute 0.2 0.0 - 0.5 K/uL   Basophils Relative 0 %   Basophils Absolute 0.0 0.0 - 0.1 K/uL   Immature Granulocytes 1 %   Abs Immature Granulocytes 0.06 0.00 - 0.07 K/uL    Comment: Performed at [facility], [address], [place], [place] [address]  Comprehensive metabolic panel     Status: Abnormal   Collection Time: 09/06/23  6:08 PM  Result Value Ref Range   Sodium 138 135 - 145 mmol/L   Potassium 5.8 (H) 3.5 - 5.1 mmol/L   Chloride 94 (L) 98 - 111 mmol/L   CO2 24 22 - 32 mmol/L   Glucose, Bld 124 (H) 70 - 99 mg/dL    Comment: Glucose reference range applies only to samples taken after fasting for at least 8 hours.   BUN 86 (H) 8 - 23 mg/dL   Creatinine, Ser 6.21 (H) 0.61 - 1.24 mg/dL   Calcium 9.2 8.9 - 30.8 mg/dL   Total Protein 7.7 6.5 - 8.1 g/dL   Albumin 4.9 3.5 - 5.0 g/dL   AST 10 (L) 15 - 41 U/L   ALT 10 0 - 44 U/L   Alkaline Phosphatase 115 38 - 126 U/L   Total Bilirubin 0.6 0.0 - 1.2  mg/dL   GFR, Estimated 5 (L) >60 mL/min    Comment: (NOTE) Calculated using the CKD-EPI Creatinine Equation (2021)    Anion gap 20 (H) 5 - 15    Comment: Performed at [facility], [address], [place], [place] [address]  Lipase, blood     Status: None   Collection Time: 09/06/23  6:08 PM  Result Value Ref Range   Lipase 49 11 - 51 U/L    Comment: Performed at [facility], [address], [place], [place] [address]   CT ABDOMEN PELVIS W CONTRAST Result Date: 09/06/2023 CLINICAL DATA:  Acute nonlocalized abdominal pain. Query small-bowel obstruction. EXAM: CT ABDOMEN AND PELVIS WITH CONTRAST TECHNIQUE: Multidetector CT imaging of the abdomen and pelvis was performed using the standard protocol following bolus administration of intravenous contrast. RADIATION DOSE REDUCTION: This exam was performed according to the departmental dose-optimization program which includes automated exposure control, adjustment of the mA and/or kV according to patient size and/or use of iterative reconstruction technique. CONTRAST:  OMNIPAQUE IOHEXOL 300 MG/ML  SOLN COMPARISON:  CT abdomen and pelvis 07/31/2023 FINDINGS: Lower chest: No acute abnormality. Hepatobiliary: Hepatic steatosis. Cholelithiasis without evidence of acute cholecystitis.  No biliary dilation. Pancreas: Unremarkable. Spleen: Unremarkable. Adrenals/Urinary Tract: Unremarkable adrenal glands. Atrophic native kidneys. Percutaneous nephrostomy tube in the left kidney. Moderate hydroureter of the distal right ureter. Irregular bladder wall thickening greater on the left. The bladder is nondistended. Stomach/Bowel: Distended stomach. Distended small bowel in the left hemiabdomen with abrupt transition point distal to an area of fecalized small bowel (circa series 5/image 63). No bowel wall thickening or evidence of bowel ischemia. The small bowel downstream from the area of obstruction extending to the right  abdominal ileostomy is decompressed. Postoperative change of colectomy. Vascular/Lymphatic: Aortic atherosclerosis. No enlarged abdominal or pelvic lymph nodes. Reproductive: Penile prosthesis.  No acute abnormality. Other: Interval removal of the presacral drain. The presacral fluid collection is unchanged measuring 3.7 x 5.5 cm (using similar measuring technique). Mild adjacent fat stranding. Small amount of free fluid superior to the bladder. No free intraperitoneal air. Musculoskeletal: No acute fracture. 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 IMPRESSION: 1. Small-bowel obstruction with abrupt transition point in the left anterior abdomen. 2. Irregular bladder wall thickening on the left. Moderate right hydroureter distally. Query ureteral obstruction from bladder mass. Cystoscopy is recommended if not performed previously. 3. Percutaneous nephrostomy tube in the left kidney. Interval decompression of the left hydroureter. 4. Interval removal of the presacral drain. The presacral fluid collection is unchanged measuring 3.7 x 5.5 cm (using similar measuring technique). 5. Hepatic steatosis. 6. Cholelithiasis without evidence of acute cholecystitis. 7. Aortic Atherosclerosis (ICD10-I70.0). Electronically Signed   By: Minerva Fester M.D.   On: 09/06/2023 20:06    Assessment/Plan 88 yo male with ESRD, A fib, UC presenting with pSBO Ostomy output currently XR midday today  FEN - bowel rest today, NG tube if vomits VTE - heparin ID - no issues Admit - hospitalist admission  I reviewed last 24 h vitals and pain scores, last 48 h intake and output, last 24 h labs and trends, and last 24 h imaging results.  De Blanch [facility] Surgery 09/07/2023, 6:53 AM Please see Amion for pager number during day hours 7:00am-4:30pm or 7:00am -11:30am on weekends

## 2023-09-07 NOTE — Progress Notes (Signed)
 STAT iv order placed for second IV for bolus

## 2023-09-07 NOTE — Consult Note (Signed)
 Renal Service Consult Note [facility]  Ivan Lacher 09/07/2023 Maree Krabbe, MD Requesting Physician: Dr. Dartha Lodge  Reason for Consult: ESRD pt w/ abd pain and no output from ostomy HPI: The patient is a 88 y.o. year-old w/ PMH as below who presented to ED for above symptoms last night. Abd pain was in the LLQ w/ some nausea but no vomiting, fever or chills. Persistent pain brought pt to ED. In ED CT showed features concerning for SBO. Gen surg consulted. K+ 5.8, Hb 8.3, wbc 11. Pt was admitted by medical team. We are asked to see for dialysis.    Pt seen in room. Last HD was Friday, has not missed HD. Denies any SOB, cough or leg swelling.    PMH Anemia Atrial fib ESRD on HD H/o DVT Hydronephrosis w/ chronic L PCN Ileostomy in place OSA H/o CVA H/o UC    ROS - denies CP, no joint pain, no HA, no blurry vision, no rash   Past Medical History  Past Medical History:  Diagnosis Date   A-fib (HCC)    Anemia    Arthritis    Cancer (HCC)    Basal cell   COVID-19    2021   Dysrhythmia    Afib-controlled on eliquis   ESRD (end stage renal disease) (HCC) 10/22/2021   Glaucoma 11/18/2021   History of DVT (deep vein thrombosis)    Hydronephrosis    managed wtih a PCN   Idiopathic neuropathy 10/22/2021   lyrica    Ileostomy in place [facility])    Obstructive uropathy    With chronic left nephrostomy   Old retinal detachment, total or subtotal    Orthostatic hypotension 10/22/2021   Sleep apnea    does not need a machine   Stroke (HCC)    Ulcerative colitis (HCC)    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   Past Surgical History  Past Surgical History:  Procedure Laterality Date   BASAL CELL CARCINOMA EXCISION     10/23   COLON SURGERY     creation of j pouch     and subsequent takedown of j pouch   EYE SURGERY     IR NEPHROSTOMY EXCHANGE LEFT  12/10/2021   IR NEPHROSTOMY EXCHANGE LEFT  04/22/2022   IR NEPHROSTOMY EXCHANGE  LEFT  07/29/2022   IR NEPHROSTOMY EXCHANGE LEFT  10/28/2022   IR NEPHROSTOMY EXCHANGE LEFT  02/05/2023   IR NEPHROSTOMY EXCHANGE LEFT  05/07/2023   IR NEPHROSTOMY EXCHANGE LEFT  06/25/2023   IR NEPHROSTOMY EXCHANGE LEFT  07/17/2023   LEFT HEART CATH AND CORONARY ANGIOGRAPHY N/A 07/22/2022   Procedure: LEFT HEART CATH AND CORONARY ANGIOGRAPHY;  Surgeon: Swaziland, Peter M, MD;  Location: [facility] INVASIVE CV LAB;  Service: Cardiovascular;  Laterality: N/A;   REVISION OF ARTERIOVENOUS GORETEX GRAFT Left 05/06/2022   Procedure: REDO LEFT THIGH ARTERIOVENOUS 4-7 MM GORETEX GRAFT;  Surgeon: Chuck Hint, MD;  Location: [facility] OR;  Service: Vascular;  Laterality: Left;   SMALL INTESTINE SURGERY     TOTAL COLECTOMY     Family History  Family History  Problem Relation Age of Onset   Stroke Mother    Cancer Father    Esophageal cancer Brother    Social History  reports that he quit smoking about 40 years ago. His smoking use included cigarettes. He started smoking about 46 years ago. He has a 12 pack-year smoking history. He has never been exposed to tobacco smoke.  He has never used smokeless tobacco. He reports current alcohol use of about 5.0 standard drinks of alcohol per week. He reports that he does not use drugs. Allergies  Allergies  Allergen Reactions   Cephalosporins Rash   Ciprofloxacin Itching and Rash   Baclofen Other (See Comments)    Altered mental status, after accidental overdose     Home medications Prior to Admission medications   Medication Sig Start Date End Date Taking? Authorizing Provider  Cyanocobalamin (VITAMIN B-12 PO) Take 1 capsule by mouth in the morning and at bedtime.   Yes [provider]  ELIQUIS 2.5 MG TABS tablet Take 1 tablet (2.5 mg total) by mouth 2 (two) times daily. 08/07/22  Yes Meriam Sprague, MD  folic acid (FOLVITE) 1 MG tablet Take 1 tablet (1 mg total) by mouth daily. 07/21/23  Yes Regalado, Belkys A, MD  latanoprost (XALATAN) 0.005 %  ophthalmic solution Place 1 drop into the right eye at bedtime.   Yes [provider]  magnesium oxide (MAG-OX) 400 (240 Mg) MG tablet Take 1 tablet (400 mg total) by mouth 2 (two) times daily. 07/21/23  Yes Regalado, Belkys A, MD  melatonin 3 MG TABS tablet Take 1 tablet (3 mg total) by mouth at bedtime as needed (insomnia). Patient taking differently: Take 3 mg by mouth at bedtime. 07/21/23  Yes Regalado, Belkys A, MD  midodrine (PROAMATINE) 10 MG tablet Take 1 tablet (10 mg total) by mouth 3 (three) times daily with meals. 07/21/23  Yes Regalado, Belkys A, MD  midodrine (PROAMATINE) 5 MG tablet Take 5 mg by mouth 3 (three) times daily with meals.   Yes [provider]  Omega-3 Fatty Acids (OMEGA-3 FISH OIL) 500 MG CAPS Take 1 capsule by mouth daily.   Yes [provider]  ramelteon (ROZEREM) 8 MG tablet TAKE 1 TABLET BY MOUTH AT BEDTIME. 07/09/23  Yes Shelva Majestic, MD  riboflavin (VITAMIN B-2) 100 MG TABS tablet Take 100 mg by mouth daily.   Yes [provider]  timolol (TIMOPTIC) 0.5 % ophthalmic solution Place 1 drop into the right eye 2 (two) times daily.   Yes [provider]  Monte Fantasia INHUB 250-50 MCG/ACT AEPB Inhale 1 puff into the lungs daily. 07/04/23  Yes [provider]  Albuterol Sulfate 2.5 MG/0.5ML NEBU Inhale 0.5 mLs (2.5 mg total) into the lungs every 6 (six) hours. Patient not taking: Reported on 09/07/2023 07/15/23   Glenford Bayley, NP  budesonide (PULMICORT) 0.5 MG/2ML nebulizer solution TAKE 2 ML (0.5 MG TOTAL) BY NEBULIZATION TWICE A DAY Patient not taking: Reported on 09/07/2023 07/23/23   Glenford Bayley, NP  dorzolamide (TRUSOPT) 2 % ophthalmic solution Place 1 drop into the right eye 2 (two) times daily. Patient not taking: Reported on 09/07/2023 06/17/23   [provider]  doxycycline (VIBRAMYCIN) 100 MG capsule Take 100 mg by mouth 2 (two) times daily. Patient not taking: Reported on 09/07/2023 08/24/23   [provider]  fluconazole (DIFLUCAN) 100 MG tablet Take 1 tablet (100 mg total) by mouth daily. Patient not taking: Reported on 09/07/2023 07/21/23   Regalado, Jon Billings A, MD  gabapentin (NEURONTIN) 300 MG capsule Take 300 mg by mouth at bedtime. Patient not taking: Reported on 09/07/2023    [provider]  LOKELMA 10 g PACK packet daily. Patient not taking: Reported on 09/07/2023 08/08/23   [provider]  pantoprazole (PROTONIX) 40 MG tablet Take 1 tablet (40 mg total) by mouth 2 (  two) times daily. Patient not taking: Reported on 09/07/2023 07/21/23   Regalado, Jon Billings A, MD  sertraline (ZOLOFT) 25 MG tablet TAKE 1 TABLET (25 MG TOTAL) BY MOUTH DAILY. Patient not taking: Reported on 09/07/2023 07/25/23   Lula Olszewski, MD  sevelamer carbonate (RENVELA) 2.4 g PACK Take 2.4 g by mouth 3 (three) times daily with meals. Patient not taking: Reported on 09/07/2023 07/21/23   Regalado, Jon Billings A, MD  sucralfate (CARAFATE) 1 g tablet Take 1 g by mouth 4 (four) times daily. Patient not taking: Reported on 09/07/2023    [provider]  valACYclovir (VALTREX) 500 MG tablet Take 1 tablet (500 mg total) by mouth daily. Patient not taking: Reported on 09/07/2023 08/20/23   Shelva Majestic, MD     Vitals:   09/07/23 0836 09/07/23 1038 09/07/23 1243 09/07/23 1420  BP:  (!) 81/55 (!) 112/51 (!) 117/56  Pulse:  (!) 119  90  Resp:   18 18  Temp:   98.1 F (36.7 C) 98.1 F (36.7 C)  TempSrc:      SpO2: 95% 95% 95% (!) 89%  Weight:      Height:       Exam Gen alert, no distress No rash, cyanosis or gangrene Sclera anicteric, throat clear  No jvd or bruits Chest clear bilat to bases, no rales/ wheezing RRR no MRG Abd soft, RLQ ileostomy bag, +BS GU normal male MS no joint effusions or deformity Ext trace bilat LE edema Neuro is alert, Ox 3 , nf    L femoral AVG +bruit       Renal-related home meds: - midodrine 5-10 tid - lokelma 10 gm every day - renvela 2400 mg ac tid -  others: carafate, valtrex, zoloft, PPI, neurontin, pulmicort, wixela, eliquis    OP HD: MWF NW  2h  B350   72kg   2K bath  L thigh AVG  Heparin none - getting to dry wt, last HD 2/28 w/ post wt 71.8kg - rocaltrol 0.5 mcg - mircera 75 mcg IV q2, last 2/28, due 3/14    Assessment/ Plan: Small bowel obstruction - w/ RLQ abd pains. Gen surgery consulting. Pt NPO. Hyperkalemia - K+ 6.0 this morning. Plan HD tonight w/ low K + bath.  ESRD - on HD MWF. Has not missed HD. HD as above.  BP - BP's run on the low side, takes 5- 10 mg midodrine tid.  Volume - looks a bit dry and wt's are slightly under the dry wt. Keep even w/ HD.  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 Secondary hyperparathyroidism - CCa in range, add on phos. Resume binders when eating.  Chronic L sided PCN tube  H/o UC - s/p colectomy in 2011 w/ ileostomy ileal pouch/ anal anastomosis in 2012.  COPD - on home inhalers      Vinson Moselle  MD [facility] 09/07/2023, 3:41 PM  Recent Labs  Lab 09/06/23 1808 09/07/23 0609  HGB 11.3* 11.2*  ALBUMIN 4.9 3.7  CALCIUM 9.2 8.8*  CREATININE 9.32* 9.58*  K 5.8* 6.0*   Inpatient medications:  dorzolamide  1 drop Both Eyes BID   latanoprost  1 drop Right Eye QHS   mometasone-formoterol  2 puff Inhalation BID   timolol  1 drop Both Eyes Daily    heparin 1,000 Units/hr (09/07/23 0645)   albuterol, fentaNYL (SUBLIMAZE) injection

## 2023-09-07 NOTE — Progress Notes (Signed)
   09/07/23 1243  Assess: MEWS Score  Temp 98.1 F (36.7 C)  BP (!) 112/51  MAP (mmHg) 70  Resp 18  Level of Consciousness Alert  SpO2 95 %  O2 Device Room Air  Assess: MEWS Score  MEWS Temp 0  MEWS Systolic 0  MEWS Pulse 2  MEWS RR 0  MEWS LOC 0  MEWS Score 2  MEWS Score Color Yellow  Assess: if the MEWS score is Yellow or Red  Were vital signs accurate and taken at a resting state? Yes  Does the patient meet 2 or more of the SIRS criteria? No  MEWS guidelines implemented  Yes, yellow  Treat  MEWS Interventions Considered administering scheduled or prn medications/treatments as ordered  Take Vital Signs  Increase Vital Sign Frequency  Yellow: Q2hr x1, continue Q4hrs until patient remains green for 12hrs  Escalate  MEWS: Escalate Yellow: Discuss with charge nurse and consider notifying provider and/or RRT  Notify: Charge Nurse/RN  Name of Charge Nurse/RN Notified Amanda,RN  Provider Notification  Provider Name/Title Marvell Fuller  Date Provider Notified 09/07/23  Time Provider Notified 1252  Method of Notification Page  Notification Reason Other (Comment) (yellow MEWS)  Provider response See new orders (another 500 cc bolus)  Date of Provider Response 09/07/23  Time of Provider Response 1253  Assess: SIRS CRITERIA  SIRS Temperature  0  SIRS Respirations  0  SIRS Pulse 1  SIRS WBC 0  SIRS Score Sum  1

## 2023-09-07 NOTE — Progress Notes (Signed)
 Changed ostomy bag 2 3/4"   2 piece (due to leakage) with rectangular foam window and made stoma pattern to save for staff in the future. Skin prepped the peristomal skin which looks a little better than earlier. Pt has a nephrostomy drain coming out of the left back and the holder is coming off, added a support to it. Patient states that he has this exchanged out by our IR dept every 3 months and it will be due in 1 month to be done. Assessed skin on bottom and it looks better than earlier, blanchable but reddened. Sacral foam placed for prevention. WOC consult placed earlier for recommendations on the peristomal skin and to assess sacral area. Ileostomy draining thin green liquid at present. Emptied bag and placed bag to the side with a pillow underneath for support to assist it from pulling down and leaking.

## 2023-09-07 NOTE — Consult Note (Signed)
 Please note that the [facility] nursing team is utilizing a standardized work plan to manage patient consults. We are triaging consults and will try to see the patients within 48 hours. Wound photos in the patient's chart allow [place] to consult on the patient in the most efficient and timely manner.     Thank-you,  Denyse Amass BSN, RN, [employer], WOC  (Pager: [phone])

## 2023-09-07 NOTE — Progress Notes (Signed)
 PHARMACY - ANTICOAGULATION CONSULT NOTE  Pharmacy Consult for heparin Indication: atrial fibrillation  Allergies  Allergen Reactions   Cephalosporins Rash   Ciprofloxacin Itching and Rash   Baclofen Other (See Comments)    Altered mental status, after accidental overdose      Patient Measurements: Height: 5\' 8"  (172.7 cm) Weight: 71.7 kg (158 lb 1.1 oz) IBW/kg (Calculated) : 68.4  Vital Signs: Temp: 97.8 F (36.6 C) (03/02 2329) Temp Source: Oral (03/02 2329) BP: 127/66 (03/02 2329) Pulse Rate: 85 (03/02 2329)  Labs: Recent Labs    09/06/23 1808  HGB 11.3*  HCT 34.0*  PLT 197  CREATININE 9.32*    Estimated Creatinine Clearance: 5.6 mL/min (A) (by C-G formula based on SCr of 9.32 mg/dL (H)).   Medical History: Past Medical History:  Diagnosis Date   A-fib (HCC)    Anemia    Arthritis    Cancer (HCC)    Basal cell   COVID-19    2021   Dysrhythmia    Afib-controlled on eliquis   ESRD (end stage renal disease) (HCC) 10/22/2021   Glaucoma 11/18/2021   History of DVT (deep vein thrombosis)    Hydronephrosis    managed wtih a PCN   Idiopathic neuropathy 10/22/2021   lyrica    Ileostomy in place [facility])    Obstructive uropathy    With chronic left nephrostomy   Old retinal detachment, total or subtotal    Orthostatic hypotension 10/22/2021   Sleep apnea    does not need a machine   Stroke (HCC)    Ulcerative colitis (HCC)    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    Medications:  Medications Prior to Admission  Medication Sig Dispense Refill Last Dose/Taking   Albuterol Sulfate 2.5 MG/0.5ML NEBU Inhale 0.5 mLs (2.5 mg total) into the lungs every 6 (six) hours. 60 mL 5    B Complex-C-Folic Acid (RENAL) 1 MG CAPS Take by mouth daily at 6 (six) AM.      BIOTIN PO Take 1 capsule by mouth daily.      budesonide (PULMICORT) 0.5 MG/2ML nebulizer solution TAKE 2 ML (0.5 MG TOTAL) BY NEBULIZATION TWICE A DAY 360 mL 1    Cyanocobalamin  (B-12) 1000 MCG SUBL Place 1 tablet under the tongue daily at 6 (six) AM. 90 tablet 3    dorzolamide (TRUSOPT) 2 % ophthalmic solution Place 1 drop into both eyes 2 (two) times daily.      doxycycline (VIBRAMYCIN) 100 MG capsule Take 100 mg by mouth 2 (two) times daily.      ELIQUIS 2.5 MG TABS tablet Take 1 tablet (2.5 mg total) by mouth 2 (two) times daily. 60 tablet 11    ferrous sulfate 325 (65 FE) MG tablet Take 1 tablet (325 mg total) by mouth daily with breakfast. 30 tablet 3    fluconazole (DIFLUCAN) 100 MG tablet Take 1 tablet (100 mg total) by mouth daily. 4 tablet 0    folic acid (FOLVITE) 1 MG tablet Take 1 tablet (1 mg total) by mouth daily. 30 tablet 0    gabapentin (NEURONTIN) 300 MG capsule Take 300 mg by mouth at bedtime.      latanoprost (XALATAN) 0.005 % ophthalmic solution Place 1 drop into the right eye at bedtime.      LOKELMA 10 g PACK packet daily.      magnesium oxide (MAG-OX) 400 (240 Mg) MG tablet Take 1 tablet (400 mg total) by mouth 2 (  two) times daily. 60 tablet 0    melatonin 3 MG TABS tablet Take 1 tablet (3 mg total) by mouth at bedtime as needed (insomnia). 30 tablet 0    midodrine (PROAMATINE) 10 MG tablet Take 1 tablet (10 mg total) by mouth 3 (three) times daily with meals. 90 tablet 0    Omega-3 Fatty Acids (OMEGA-3 FISH OIL) 500 MG CAPS Take 1 capsule by mouth daily.      pantoprazole (PROTONIX) 40 MG tablet Take 1 tablet (40 mg total) by mouth 2 (two) times daily. 60 tablet 0    ramelteon (ROZEREM) 8 MG tablet TAKE 1 TABLET BY MOUTH AT BEDTIME. 90 tablet 2    riboflavin (VITAMIN B-2) 100 MG TABS tablet Take 100 mg by mouth daily.      sertraline (ZOLOFT) 25 MG tablet TAKE 1 TABLET (25 MG TOTAL) BY MOUTH DAILY. 90 tablet 2    sevelamer carbonate (RENVELA) 2.4 g PACK Take 2.4 g by mouth 3 (three) times daily with meals. 90 each 0    sucralfate (CARAFATE) 1 g tablet Take 1 g by mouth 4 (four) times daily.      timolol (TIMOPTIC) 0.5 % ophthalmic solution Place  1 drop into both eyes daily.      valACYclovir (VALTREX) 500 MG tablet Take 1 tablet (500 mg total) by mouth daily. 7 tablet 0    WIXELA INHUB 250-50 MCG/ACT AEPB Inhale 1 puff into the lungs 2 (two) times daily.      Scheduled:   albuterol  3 mL Inhalation Q6H   dorzolamide  1 drop Both Eyes BID   latanoprost  1 drop Right Eye QHS   mometasone-formoterol  2 puff Inhalation BID   timolol  1 drop Both Eyes Daily    Assessment: 88yo male c/o abdominal pain and reduced ostomy output, admitted for SBO, to transition from apixaban to heparin for Afib while awaiting possible surgical intervention.  Goal of Therapy:  Heparin level 0.3-0.7 units/ml aPTT 66-102 seconds Monitor platelets by anticoagulation protocol: Yes   Plan:  Start heparin infusion at 1000 units/hr. Monitor heparin levels, aPTT (while DOAC affects anti-Xa assay), and CBC.  Vernard Gambles, PharmD, BCPS  09/07/2023,3:37 AM

## 2023-09-07 NOTE — Progress Notes (Signed)
   09/07/23 1038  Assess: MEWS Score  BP (!) 81/55  MAP (mmHg) 65  Pulse Rate (!) 119  Level of Consciousness Alert  SpO2 95 %  O2 Device Room Air  Assess: MEWS Score  MEWS Temp 0  MEWS Systolic 1  MEWS Pulse 2  MEWS RR 0  MEWS LOC 0  MEWS Score 3  MEWS Score Color Yellow  Assess: if the MEWS score is Yellow or Red  Were vital signs accurate and taken at a resting state? Yes  Does the patient meet 2 or more of the SIRS criteria? No  MEWS guidelines implemented  Yes, yellow  Treat  MEWS Interventions Considered administering scheduled or prn medications/treatments as ordered  Take Vital Signs  Increase Vital Sign Frequency  Yellow: Q2hr x1, continue Q4hrs until patient remains green for 12hrs  Escalate  MEWS: Escalate Yellow: Discuss with charge nurse and consider notifying provider and/or RRT  Notify: Charge Nurse/RN  Name of Charge Nurse/RN Notified Marchelle Folks, RN  Provider Notification  Provider Name/Title Janell Quiet  Date Provider Notified 09/07/23  Time Provider Notified 1046  Method of Notification Face-to-face  Notification Reason Other (Comment) (Yellow MEWS)  Provider response See new orders  Date of Provider Response 09/07/23  Time of Provider Response 1047  Assess: SIRS CRITERIA  SIRS Temperature  0  SIRS Respirations  0  SIRS Pulse 1  SIRS WBC 0  SIRS Score Sum  1

## 2023-09-07 NOTE — Progress Notes (Signed)
 PROGRESS NOTE    Joshua Riley  ZOX:[id number] DOB: 1936-04-21 DOA: 09/06/2023 PCP: Shelva Majestic, MD  Outpatient Specialists:     Brief Narrative:  Patient is an 88 year old male with past medical history significant for ESRD on hemodialysis Monday, Wednesday and Friday; paroxysmal atrial fibrillation, depression, 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  Patient was admitted last December and discharged on 07/21/2023 after treat for sepsis secondary to infected groin abscess (managed conservatively with IV antibiotics meropenem for ESBL E. coli, and drain placement.  The drain subsequently fell off and interventional radiologist is planning to observe at this time).  Patient was also recently treated with antibiotics for right lower extremity cellulitis.  Patient was admitted with left lower quadrant abdominal pain, with associated nausea.  Patient's ostomy output was significantly decreased.  There was some concerns for possible small bowel obstruction on presentation.  Potassium was also elevated.  09/07/2023: During the hospital stay, patient has been noted to be hypotensive, necessitating volume resuscitation (total of 1 L of normal saline).  Hypotension has resolved.  GI symptoms are also resolved.  Left lower abdominal pain has resolved.  Surgery input is highly appreciated.  Nephrology team has been consulted.  Assessment & Plan:   Principal Problem:   SBO (small bowel obstruction) (HCC) Active Problems:   Colostomy status (HCC)   Nephrostomy present (HCC)   Atrial fibrillation, persistent (HCC)   End-stage renal disease on hemodialysis (HCC)   Ulcerative colitis (HCC)   History of COPD   History of anemia due to chronic kidney disease   Possible small bowel obstruction: - General  Surgery has been consulted.   -Symptoms seem to have resolved significantly.   -Surgery team is directing care.    ESRD on hemodialysis: -On hemodialysis Monday, Wednesday and Friday. -Nephrology team consulted. -Potassium of 6. -Lokelma 10 g p.o. x 1 dose. -For hemodialysis.    Paroxysmal atrial fibrillation: -Continue anticoagulation (patient is on heparin drip (. -Rate controlled.    Chronic macrocytic anemia: -Hemoglobin of 11.2 g/dL (stable). -Patient takes B12 and iron supplements.   -Folate is greater than 40 and vitamin B12 of 4157.   -Last iron panel revealed iron of 144, TIBC of 349 and ferritin of 1518 (done on 07/25/2023). -PCP may consider referral to heme-onc team on discharge.  Recent admission for sepsis secondary to infected groin abscess and presacral fluid collection: -Treated with drain placement and IV antibiotics for 4 weeks.  Drain eventually fell off and radiologist is planning to monitor.  Obstructive uropathy with chronic left-sided percutaneous nephrostomy tube:  Ulcerative colitis: -Status post colectomy in 2011 with ileostomy ileal pouch and anal anastomosis surgery in 2012.  History of depression:   Recently treated for right lower extremity cellulitis: -Will continue to observe.  Right-sided hydroureter: -Radiologist recommended to check with cystoscopy to rule out any bladder mass. -Needs to follow-up with urology as outpatient.  History of GERD: -On PPI.  History of COPD: -Continue home inhalers.  Hyperkalemia: -Lokelma 10 g x 1 dose. -Awaiting hemodialysis.  DVT prophylaxis: Heparin.   Code Status: Full code. Family Communication:    Disposition Plan: Patient remains inpatient.   Consultants:  Surgery. Nephrology.  Procedures:  None.  Antimicrobials:  None.   Subjective: -Abdominal pain has improved. -Improved ostomy output.  Objective: Vitals:   09/07/23 0300 09/07/23 0356 09/07/23 8119  09/07/23 0836  BP:   104/61 (!) 83/45   Pulse:  70 (!) 57   Resp:  17 18   Temp:  97.6 F (36.4 C) 98 F (36.7 C)   TempSrc:  Oral    SpO2:  100% 99% 95%  Weight: 71.7 kg     Height: 5\' 8"  (1.727 m)      No intake or output data in the 24 hours ending 09/07/23 0939 Filed Weights   09/07/23 0300  Weight: 71.7 kg    Examination:  General exam: Appears calm and comfortable  Respiratory system: Clear to auscultation.  Cardiovascular system: S1 & S2 heard Gastrointestinal system: Abdomen is soft and nontender.  Ostomy bag intact.   Central nervous system: Awake and alert.  Moves all extremities.   Data Reviewed: I have personally reviewed following labs and imaging studies  CBC: Recent Labs  Lab 09/06/23 1808 09/07/23 0609  WBC 11.2* 7.2  NEUTROABS 9.3* 5.8  HGB 11.3* 11.2*  HCT 34.0* 33.4*  MCV 115.6* 113.6*  PLT 197 188   Basic Metabolic Panel: Recent Labs  Lab 09/06/23 1808 09/07/23 0609  NA 138 137  K 5.8* 6.0*  CL 94* 93*  CO2 24 22  GLUCOSE 124* 95  BUN 86* 90*  CREATININE 9.32* 9.58*  CALCIUM 9.2 8.8*   GFR: Estimated Creatinine Clearance: 5.5 mL/min (A) (by C-G formula based on SCr of 9.58 mg/dL (H)). Liver Function Tests: Recent Labs  Lab 09/06/23 1808 09/07/23 0609  AST 10* 16  ALT 10 15  ALKPHOS 115 96  BILITOT 0.6 1.0  PROT 7.7 7.1  ALBUMIN 4.9 3.7   Recent Labs  Lab 09/06/23 1808  LIPASE 49   No results for input(s): "AMMONIA" in the last 168 hours. Coagulation Profile: No results for input(s): "INR", "PROTIME" in the last 168 hours. Cardiac Enzymes: No results for input(s): "CKTOTAL", "CKMB", "CKMBINDEX", "TROPONINI" in the last 168 hours. BNP (last 3 results) No results for input(s): "PROBNP" in the last 8760 hours. HbA1C: No results for input(s): "HGBA1C" in the last 72 hours. CBG: No results for input(s): "GLUCAP" in the last 168 hours. Lipid Profile: No results for input(s): "CHOL", "HDL", "LDLCALC", "TRIG", "CHOLHDL", "LDLDIRECT" in the  last 72 hours. Thyroid Function Tests: No results for input(s): "TSH", "T4TOTAL", "FREET4", "T3FREE", "THYROIDAB" in the last 72 hours. Anemia Panel: Recent Labs    09/07/23 0609  VITAMINB12 4,157*  FOLATE >40.0   Urine analysis:    Component Value Date/Time   COLORURINE STRAW (A) 07/09/2023 1221   APPEARANCEUR TURBID (A) 07/09/2023 1221   LABSPEC  07/09/2023 1221    TEST NOT REPORTED DUE TO COLOR INTERFERENCE OF URINE PIGMENT   PHURINE  07/09/2023 1221    TEST NOT REPORTED DUE TO COLOR INTERFERENCE OF URINE PIGMENT   GLUCOSEU (A) 07/09/2023 1221    TEST NOT REPORTED DUE TO COLOR INTERFERENCE OF URINE PIGMENT   HGBUR (A) 07/09/2023 1221    TEST NOT REPORTED DUE TO COLOR INTERFERENCE OF URINE PIGMENT   BILIRUBINUR (A) 07/09/2023 1221    TEST NOT REPORTED DUE TO COLOR INTERFERENCE OF URINE PIGMENT   KETONESUR (A) 07/09/2023 1221    TEST NOT REPORTED DUE TO COLOR INTERFERENCE OF URINE PIGMENT   PROTEINUR (A) 07/09/2023 1221    TEST NOT REPORTED DUE TO COLOR INTERFERENCE OF URINE PIGMENT   NITRITE (A) 07/09/2023 1221    TEST NOT REPORTED DUE TO COLOR INTERFERENCE OF URINE PIGMENT   LEUKOCYTESUR (A) 07/09/2023 1221  TEST NOT REPORTED DUE TO COLOR INTERFERENCE OF URINE PIGMENT   Sepsis Labs: @LABRCNTIP (procalcitonin:4,lacticidven:4)  )No results found for this or any previous visit (from the past 240 hours).       Radiology Studies: CT ABDOMEN PELVIS W CONTRAST Result Date: 09/06/2023 CLINICAL DATA:  Acute nonlocalized abdominal pain. Query small-bowel obstruction. EXAM: CT ABDOMEN AND PELVIS WITH CONTRAST TECHNIQUE: Multidetector CT imaging of the abdomen and pelvis was performed using the standard protocol following bolus administration of intravenous contrast. RADIATION DOSE REDUCTION: This exam was performed according to the departmental dose-optimization program which includes automated exposure control, adjustment of the mA and/or kV according to patient size and/or use  of iterative reconstruction technique. CONTRAST:  OMNIPAQUE IOHEXOL 300 MG/ML  SOLN COMPARISON:  CT abdomen and pelvis 07/31/2023 FINDINGS: Lower chest: No acute abnormality. Hepatobiliary: Hepatic steatosis. Cholelithiasis without evidence of acute cholecystitis. No biliary dilation. Pancreas: Unremarkable. Spleen: Unremarkable. Adrenals/Urinary Tract: Unremarkable adrenal glands. Atrophic native kidneys. Percutaneous nephrostomy tube in the left kidney. Moderate hydroureter of the distal right ureter. Irregular bladder wall thickening greater on the left. The bladder is nondistended. Stomach/Bowel: Distended stomach. Distended small bowel in the left hemiabdomen with abrupt transition point distal to an area of fecalized small bowel (circa series 5/image 63). No bowel wall thickening or evidence of bowel ischemia. The small bowel downstream from the area of obstruction extending to the right abdominal ileostomy is decompressed. Postoperative change of colectomy. Vascular/Lymphatic: Aortic atherosclerosis. No enlarged abdominal or pelvic lymph nodes. Reproductive: Penile prosthesis.  No acute abnormality. Other: Interval removal of the presacral drain. The presacral fluid collection is unchanged measuring 3.7 x 5.5 cm (using similar measuring technique). Mild adjacent fat stranding. Small amount of free fluid superior to the bladder. No free intraperitoneal air. Musculoskeletal: No acute fracture. 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 IMPRESSION: 1. Small-bowel obstruction with abrupt transition point in the left anterior abdomen. 2. Irregular bladder wall thickening on the left. Moderate right hydroureter distally. Query ureteral obstruction from bladder mass. Cystoscopy is recommended if not performed previously. 3. Percutaneous nephrostomy tube in the left kidney. Interval decompression of the left hydroureter. 4. Interval removal of the presacral drain. The presacral fluid collection is  unchanged measuring 3.7 x 5.5 cm (using similar measuring technique). 5. Hepatic steatosis. 6. Cholelithiasis without evidence of acute cholecystitis. 7. Aortic Atherosclerosis (ICD10-I70.0). Electronically Signed   By: Minerva Fester M.D.   On: 09/06/2023 20:06        Scheduled Meds:  dorzolamide  1 drop Both Eyes BID   latanoprost  1 drop Right Eye QHS   mometasone-formoterol  2 puff Inhalation BID   timolol  1 drop Both Eyes Daily   Continuous Infusions:  heparin 1,000 Units/hr (09/07/23 0645)     LOS: 1 day    Time spent: 55 minutes.    Berton Mount, MD  [facility] Pager #: [phone] 7PM-7AM contact night coverage as above

## 2023-09-07 NOTE — H&P (Addendum)
 History and Physical    Joshua Riley:[id number] DOB: 07-16-1935 DOA: 09/06/2023  Patient coming from: Home.  Chief Complaint: Abdominal pain.  HPI: Joshua Riley is a 88 y.o. male with history of ESRD on hemodialysis on Monday Wednesday Friday, paroxysmal atrial fibrillation, depression, obstructive uropathy with chronic left-sided percutaneous nephrostomy, ulcerative colitis status post colectomy in 2011 with ileostomy, ileal pouch and anal anastomosis surgery in 2012, chronic hypotension, anemia, prior history of DVT was admitted in last December and discharged on 07/21/2023 after being treated for sepsis from infected groin abscess which was treated conservatively with IV antibiotics meropenem for ESBL E. coli and drain placement and drain has subsequently fell off and interventional radiologist is planning to observe at this time and also was recently treated with antibiotics for right lower extremity cellulitis started experiencing abdominal pain in the left lower quadrant since yesterday morning associated with some nausea but no vomiting fever or chills.  Patient's ostomy output was decreased.  Since pain is persistent patient presents to the ER.  ED Course: In the ER CT abdomen pelvis done shows features concerning for small bowel obstruction with transition point.  On-call general surgeon was consulted and will be seeing patient in consult.  Labs show potassium of 5.8 for which patient was given Lokelma.  Hemoglobin of 8.3 macrocytic WBC 11.2.  CT scan also shows moderate right-sided distal hydro urinary tract for which cystoscopy was recommended to rule out any bladder mass.  Review of Systems: As per HPI, rest all negative.   Past Medical History:  Diagnosis Date   A-fib (HCC)    Anemia    Arthritis    Cancer (HCC)    Basal cell   COVID-19    2021   Dysrhythmia    Afib-controlled on eliquis   ESRD (end stage renal disease) (HCC) 10/22/2021   Glaucoma 11/18/2021   History  of DVT (deep vein thrombosis)    Hydronephrosis    managed wtih a PCN   Idiopathic neuropathy 10/22/2021   lyrica    Ileostomy in place [facility])    Obstructive uropathy    With chronic left nephrostomy   Old retinal detachment, total or subtotal    Orthostatic hypotension 10/22/2021   Sleep apnea    does not need a machine   Stroke (HCC)    Ulcerative colitis (HCC)    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    Past Surgical History:  Procedure Laterality Date   BASAL CELL CARCINOMA EXCISION     10/23   COLON SURGERY     creation of j pouch     and subsequent takedown of j pouch   EYE SURGERY     IR NEPHROSTOMY EXCHANGE LEFT  12/10/2021   IR NEPHROSTOMY EXCHANGE LEFT  04/22/2022   IR NEPHROSTOMY EXCHANGE LEFT  07/29/2022   IR NEPHROSTOMY EXCHANGE LEFT  10/28/2022   IR NEPHROSTOMY EXCHANGE LEFT  02/05/2023   IR NEPHROSTOMY EXCHANGE LEFT  05/07/2023   IR NEPHROSTOMY EXCHANGE LEFT  06/25/2023   IR NEPHROSTOMY EXCHANGE LEFT  07/17/2023   LEFT HEART CATH AND CORONARY ANGIOGRAPHY N/A 07/22/2022   Procedure: LEFT HEART CATH AND CORONARY ANGIOGRAPHY;  Surgeon: [place], Peter M, MD;  Location: [facility] INVASIVE CV LAB;  Service: Cardiovascular;  Laterality: N/A;   REVISION OF ARTERIOVENOUS GORETEX GRAFT Left 05/06/2022   Procedure: REDO LEFT THIGH ARTERIOVENOUS 4-7 MM GORETEX GRAFT;  Surgeon: Chuck Hint, MD;  Location: [facility] OR;  Service: Vascular;  Laterality:  Left;   SMALL INTESTINE SURGERY     TOTAL COLECTOMY       reports that he quit smoking about 40 years ago. His smoking use included cigarettes. He started smoking about 46 years ago. He has a 12 pack-year smoking history. He has never been exposed to tobacco smoke. He has never used smokeless tobacco. He reports current alcohol use of about 5.0 standard drinks of alcohol per week. He reports that he does not use drugs.  Allergies  Allergen Reactions   Cephalosporins Rash   Ciprofloxacin Itching and Rash    Baclofen Other (See Comments)    Altered mental status, after accidental overdose      Family History  Problem Relation Age of Onset   Stroke Mother    Cancer Father    Esophageal cancer Brother     Prior to Admission medications   Medication Sig Start Date End Date Taking? Authorizing Provider  Albuterol Sulfate 2.5 MG/0.5ML NEBU Inhale 0.5 mLs (2.5 mg total) into the lungs every 6 (six) hours. 07/15/23   Glenford Bayley, NP  B Complex-C-Folic Acid (RENAL) 1 MG CAPS Take by mouth daily at 6 (six) AM. 08/08/23   [provider]  BIOTIN PO Take 1 capsule by mouth daily.    [provider]  budesonide (PULMICORT) 0.5 MG/2ML nebulizer solution TAKE 2 ML (0.5 MG TOTAL) BY NEBULIZATION TWICE A DAY 07/23/23   Glenford Bayley, NP  Cyanocobalamin (B-12) 1000 MCG SUBL Place 1 tablet under the tongue daily at 6 (six) AM. 04/08/23   Lula Olszewski, MD  dorzolamide (TRUSOPT) 2 % ophthalmic solution Place 1 drop into both eyes 2 (two) times daily. 06/17/23   [provider]  doxycycline (VIBRAMYCIN) 100 MG capsule Take 100 mg by mouth 2 (two) times daily. 08/24/23   [provider]  ELIQUIS 2.5 MG TABS tablet Take 1 tablet (2.5 mg total) by mouth 2 (two) times daily. 08/07/22   Meriam Sprague, MD  ferrous sulfate 325 (65 FE) MG tablet Take 1 tablet (325 mg total) by mouth daily with breakfast. 07/21/23   Regalado, Belkys A, MD  fluconazole (DIFLUCAN) 100 MG tablet Take 1 tablet (100 mg total) by mouth daily. 07/21/23   Regalado, Belkys A, MD  folic acid (FOLVITE) 1 MG tablet Take 1 tablet (1 mg total) by mouth daily. 07/21/23   Regalado, Belkys A, MD  gabapentin (NEURONTIN) 300 MG capsule Take 300 mg by mouth at bedtime.    [provider]  latanoprost (XALATAN) 0.005 % ophthalmic solution Place 1 drop into the right eye at bedtime.    [provider]  LOKELMA 10 g PACK packet daily. 08/08/23   [provider]  magnesium oxide (MAG-OX)  400 (240 Mg) MG tablet Take 1 tablet (400 mg total) by mouth 2 (two) times daily. 07/21/23   Regalado, Belkys A, MD  melatonin 3 MG TABS tablet Take 1 tablet (3 mg total) by mouth at bedtime as needed (insomnia). 07/21/23   Regalado, Belkys A, MD  midodrine (PROAMATINE) 10 MG tablet Take 1 tablet (10 mg total) by mouth 3 (three) times daily with meals. 07/21/23   Regalado, Belkys A, MD  Omega-3 Fatty Acids (OMEGA-3 FISH OIL) 500 MG CAPS Take 1 capsule by mouth daily.    [provider]  pantoprazole (PROTONIX) 40 MG tablet Take 1 tablet (40 mg total) by mouth 2 (two) times daily. 07/21/23   Regalado, Belkys A, MD  ramelteon (ROZEREM) 8 MG  tablet TAKE 1 TABLET BY MOUTH AT BEDTIME. 07/09/23   Shelva Majestic, MD  riboflavin (VITAMIN B-2) 100 MG TABS tablet Take 100 mg by mouth daily.    [provider]  sertraline (ZOLOFT) 25 MG tablet TAKE 1 TABLET (25 MG TOTAL) BY MOUTH DAILY. 07/25/23   Lula Olszewski, MD  sevelamer carbonate (RENVELA) 2.4 g PACK Take 2.4 g by mouth 3 (three) times daily with meals. 07/21/23   Regalado, Belkys A, MD  sucralfate (CARAFATE) 1 g tablet Take 1 g by mouth 4 (four) times daily.    [provider]  timolol (TIMOPTIC) 0.5 % ophthalmic solution Place 1 drop into both eyes daily.    [provider]  valACYclovir (VALTREX) 500 MG tablet Take 1 tablet (500 mg total) by mouth daily. 08/20/23   Shelva Majestic, MD  Monte Fantasia INHUB 250-50 MCG/ACT AEPB Inhale 1 puff into the lungs 2 (two) times daily. 07/04/23   [provider]    Physical Exam: Constitutional: Moderately built and nourished. Vitals:   09/06/23 2115 09/06/23 2224 09/06/23 2230 09/06/23 2329  BP:   (!) 97/55 127/66  Pulse: 60  61 85  Resp:    18  Temp:  97.9 F (36.6 C)  97.8 F (36.6 C)  TempSrc:  Oral  Oral  SpO2: 100%  100% 100%   Eyes: Anicteric no pallor. ENMT: No discharge from the ears eyes nose or mouth. Neck: No mass felt.  No neck rigidity. Respiratory:  No rhonchi or crepitations. Cardiovascular: S1 S2 heard. Abdomen: Soft nontender bowel sound not appreciated. Musculoskeletal: No edema. Skin: Mild redness of the right anterior shin does not look inflamed.  No warmth to touch. Neurologic: Alert awake oriented to time place and person.  Moves all extremities. Psychiatric: Appears normal.  Normal affect.   Labs on Admission: I have personally reviewed following labs and imaging studies  CBC: Recent Labs  Lab 09/06/23 1808  WBC 11.2*  NEUTROABS 9.3*  HGB 11.3*  HCT 34.0*  MCV 115.6*  PLT 197   Basic Metabolic Panel: Recent Labs  Lab 09/06/23 1808  NA 138  K 5.8*  CL 94*  CO2 24  GLUCOSE 124*  BUN 86*  CREATININE 9.32*  CALCIUM 9.2   GFR: Estimated Creatinine Clearance: 5.6 mL/min (A) (by C-G formula based on SCr of 9.32 mg/dL (H)). Liver Function Tests: Recent Labs  Lab 09/06/23 1808  AST 10*  ALT 10  ALKPHOS 115  BILITOT 0.6  PROT 7.7  ALBUMIN 4.9   Recent Labs  Lab 09/06/23 1808  LIPASE 49   No results for input(s): "AMMONIA" in the last 168 hours. Coagulation Profile: No results for input(s): "INR", "PROTIME" in the last 168 hours. Cardiac Enzymes: No results for input(s): "CKTOTAL", "CKMB", "CKMBINDEX", "TROPONINI" in the last 168 hours. BNP (last 3 results) No results for input(s): "PROBNP" in the last 8760 hours. HbA1C: No results for input(s): "HGBA1C" in the last 72 hours. CBG: No results for input(s): "GLUCAP" in the last 168 hours. Lipid Profile: No results for input(s): "CHOL", "HDL", "LDLCALC", "TRIG", "CHOLHDL", "LDLDIRECT" in the last 72 hours. Thyroid Function Tests: No results for input(s): "TSH", "T4TOTAL", "FREET4", "T3FREE", "THYROIDAB" in the last 72 hours. Anemia Panel: No results for input(s): "VITAMINB12", "FOLATE", "FERRITIN", "TIBC", "IRON", "RETICCTPCT" in the last 72 hours. Urine analysis:    Component Value Date/Time   COLORURINE STRAW (A) 07/09/2023 1221    APPEARANCEUR TURBID (A) 07/09/2023 1221   LABSPEC  07/09/2023 1221  TEST NOT REPORTED DUE TO COLOR INTERFERENCE OF URINE PIGMENT   PHURINE  07/09/2023 1221    TEST NOT REPORTED DUE TO COLOR INTERFERENCE OF URINE PIGMENT   GLUCOSEU (A) 07/09/2023 1221    TEST NOT REPORTED DUE TO COLOR INTERFERENCE OF URINE PIGMENT   HGBUR (A) 07/09/2023 1221    TEST NOT REPORTED DUE TO COLOR INTERFERENCE OF URINE PIGMENT   BILIRUBINUR (A) 07/09/2023 1221    TEST NOT REPORTED DUE TO COLOR INTERFERENCE OF URINE PIGMENT   KETONESUR (A) 07/09/2023 1221    TEST NOT REPORTED DUE TO COLOR INTERFERENCE OF URINE PIGMENT   PROTEINUR (A) 07/09/2023 1221    TEST NOT REPORTED DUE TO COLOR INTERFERENCE OF URINE PIGMENT   NITRITE (A) 07/09/2023 1221    TEST NOT REPORTED DUE TO COLOR INTERFERENCE OF URINE PIGMENT   LEUKOCYTESUR (A) 07/09/2023 1221    TEST NOT REPORTED DUE TO COLOR INTERFERENCE OF URINE PIGMENT   Sepsis Labs: @LABRCNTIP (procalcitonin:4,lacticidven:4) )No results found for this or any previous visit (from the past 240 hours).   Radiological Exams on Admission: CT ABDOMEN PELVIS W CONTRAST Result Date: 09/06/2023 CLINICAL DATA:  Acute nonlocalized abdominal pain. Query small-bowel obstruction. EXAM: CT ABDOMEN AND PELVIS WITH CONTRAST TECHNIQUE: Multidetector CT imaging of the abdomen and pelvis was performed using the standard protocol following bolus administration of intravenous contrast. RADIATION DOSE REDUCTION: This exam was performed according to the departmental dose-optimization program which includes automated exposure control, adjustment of the mA and/or kV according to patient size and/or use of iterative reconstruction technique. CONTRAST:  OMNIPAQUE IOHEXOL 300 MG/ML  SOLN COMPARISON:  CT abdomen and pelvis 07/31/2023 FINDINGS: Lower chest: No acute abnormality. Hepatobiliary: Hepatic steatosis. Cholelithiasis without evidence of acute cholecystitis. No biliary dilation. Pancreas:  Unremarkable. Spleen: Unremarkable. Adrenals/Urinary Tract: Unremarkable adrenal glands. Atrophic native kidneys. Percutaneous nephrostomy tube in the left kidney. Moderate hydroureter of the distal right ureter. Irregular bladder wall thickening greater on the left. The bladder is nondistended. Stomach/Bowel: Distended stomach. Distended small bowel in the left hemiabdomen with abrupt transition point distal to an area of fecalized small bowel (circa series 5/image 63). No bowel wall thickening or evidence of bowel ischemia. The small bowel downstream from the area of obstruction extending to the right abdominal ileostomy is decompressed. Postoperative change of colectomy. Vascular/Lymphatic: Aortic atherosclerosis. No enlarged abdominal or pelvic lymph nodes. Reproductive: Penile prosthesis.  No acute abnormality. Other: Interval removal of the presacral drain. The presacral fluid collection is unchanged measuring 3.7 x 5.5 cm (using similar measuring technique). Mild adjacent fat stranding. Small amount of free fluid superior to the bladder. No free intraperitoneal air. Musculoskeletal: No acute fracture. 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 IMPRESSION: 1. Small-bowel obstruction with abrupt transition point in the left anterior abdomen. 2. Irregular bladder wall thickening on the left. Moderate right hydroureter distally. Query ureteral obstruction from bladder mass. Cystoscopy is recommended if not performed previously. 3. Percutaneous nephrostomy tube in the left kidney. Interval decompression of the left hydroureter. 4. Interval removal of the presacral drain. The presacral fluid collection is unchanged measuring 3.7 x 5.5 cm (using similar measuring technique). 5. Hepatic steatosis. 6. Cholelithiasis without evidence of acute cholecystitis. 7. Aortic Atherosclerosis (ICD10-I70.0). Electronically Signed   By: Minerva Fester M.D.   On: 09/06/2023 20:06     Assessment/Plan Principal  Problem:   SBO (small bowel obstruction) (HCC) Active Problems:   End-stage renal disease on hemodialysis (HCC)   Atrial fibrillation, persistent (HCC)   Ulcerative colitis (  HCC)   Colostomy status (HCC)   Nephrostomy present (HCC)   History of COPD   History of anemia due to chronic kidney disease    Small bowel obstruction -     General Surgery has been consulted.  Will keep patient NPO.  Patient has not had any vomiting so for now we will avoid NG tube.  Check KUB.  Further recommendations per general surgery. ESRD on hemodialysis on Monday Wednesday Friday consult nephrology for dialysis.  Did receive Lokelma due to hyperkalemia.  Will recheck metabolic panel. Paroxysmal atrial fibrillation not on any rate limiting medications due to chronic hypotension.  Patient takes Eliquis for anticoagulation will keep patient on heparin bridging until patient can take orals. Chronic macrocytic anemia follow CBC.  Takes B12 and iron supplements.  Check B12 and folate levels. Recent admission for sepsis secondary to infected groin abscess and presacral fluid collection which was treated with drain placement and IV antibiotics for 4 weeks.  Drain eventually fell off and radiologist is planning to monitor. Obstructive uropathy with chronic left-sided percutaneous nephrostomy tube. Ulcerative colitis status post colectomy in 2011 with ileostomy ileal pouch and anal anastomosis surgery in 2012. History of depression on Zoloft and Remeron. Recently treated for right lower extremity cellulitis.  Redness still persists but does not look inflamed.  Will continue to observe. Right-sided hydroureter with radiologist recommending to check with cystoscopy to rule out any bladder mass.  Needs to follow-up with urology as outpatient. History of GERD on PPI. History of COPD continue home inhalers.  Since patient has bowel obstruction will need close monitoring and further management and more than 2 midnight  stay.   DVT prophylaxis: Heparin infusion. Code Status: Full code. Family Communication: Discussed with patient. Disposition Plan: Medical floor. Consults called: General Surgery. Admission status: Inpatient.

## 2023-09-07 NOTE — Progress Notes (Signed)
 Entered room because pt was calling out. RLQ Ileostomy bag was leaking and there was liquid green drainage everywhere. Pt had also vomited green liquid and had saved it for the Dr. To see.  Assisted pt to relax and began cleaning pt up. Changed 2 piece Hollister pouch 2 3/4" after prepping the peristomal skin around the pouch which was very red. Created a rectangular foam dressing window around the pouch for support since bag filling up so quickly. Most of the ileostomy drainage was liquid but there was a little bit that was semi formed. Saved patients bag clip for use later and instructed him on the newer type of closure. Informed Dr. Dartha Lodge and Dr. Sheliah Hatch of clinical updates of ileostomy working profusely. Placed WOC consult for recommendations for the peristomal skin which is reddened and for skin on his bottom.

## 2023-09-07 NOTE — Progress Notes (Signed)
2nd bolus started  

## 2023-09-07 NOTE — Progress Notes (Signed)
 Pt refused afternoon blood draw. [place],MD made aware

## 2023-09-07 NOTE — Progress Notes (Signed)
 PHARMACY - ANTICOAGULATION CONSULT NOTE  Pharmacy Consult for heparin>>apixaban Indication: atrial fibrillation  Allergies  Allergen Reactions   Cephalosporins Rash   Ciprofloxacin Itching and Rash   Baclofen Other (See Comments)    Altered mental status, after accidental overdose      Patient Measurements: Height: 5\' 8"  (172.7 cm) Weight: 71.7 kg (158 lb 1.1 oz) IBW/kg (Calculated) : 68.4  Vital Signs: Temp: 98.1 F (36.7 C) (03/03 1420) BP: 117/56 (03/03 1420) Pulse Rate: 90 (03/03 1420)  Labs: Recent Labs    09/06/23 1808 09/07/23 0609  HGB 11.3* 11.2*  HCT 34.0* 33.4*  PLT 197 188  APTT  --  30  HEPARINUNFRC  --  1.01*  CREATININE 9.32* 9.58*    Estimated Creatinine Clearance: 5.5 mL/min (A) (by C-G formula based on SCr of 9.58 mg/dL (H)).   Medical History: Past Medical History:  Diagnosis Date   A-fib (HCC)    Anemia    Arthritis    Cancer (HCC)    Basal cell   COVID-19    2021   Dysrhythmia    Afib-controlled on eliquis   ESRD (end stage renal disease) (HCC) 10/22/2021   Glaucoma 11/18/2021   History of DVT (deep vein thrombosis)    Hydronephrosis    managed wtih a PCN   Idiopathic neuropathy 10/22/2021   lyrica    Ileostomy in place [facility])    Obstructive uropathy    With chronic left nephrostomy   Old retinal detachment, total or subtotal    Orthostatic hypotension 10/22/2021   Sleep apnea    does not need a machine   Stroke (HCC)    Ulcerative colitis (HCC)    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    Medications:  Medications Prior to Admission  Medication Sig Dispense Refill Last Dose/Taking   Cyanocobalamin (VITAMIN B-12 PO) Take 1 capsule by mouth in the morning and at bedtime.   09/06/2023   ELIQUIS 2.5 MG TABS tablet Take 1 tablet (2.5 mg total) by mouth 2 (two) times daily. 60 tablet 11 09/06/2023 at  8:00 PM   folic acid (FOLVITE) 1 MG tablet Take 1 tablet (1 mg total) by mouth daily. 30 tablet 0 Past Week    latanoprost (XALATAN) 0.005 % ophthalmic solution Place 1 drop into the right eye at bedtime.   09/05/2023   magnesium oxide (MAG-OX) 400 (240 Mg) MG tablet Take 1 tablet (400 mg total) by mouth 2 (two) times daily. 60 tablet 0 09/05/2023   melatonin 3 MG TABS tablet Take 1 tablet (3 mg total) by mouth at bedtime as needed (insomnia). (Patient taking differently: Take 3 mg by mouth at bedtime.) 30 tablet 0 09/05/2023   midodrine (PROAMATINE) 10 MG tablet Take 1 tablet (10 mg total) by mouth 3 (three) times daily with meals. 90 tablet 0 09/06/2023   midodrine (PROAMATINE) 5 MG tablet Take 5 mg by mouth 3 (three) times daily with meals.   09/06/2023   Omega-3 Fatty Acids (OMEGA-3 FISH OIL) 500 MG CAPS Take 1 capsule by mouth daily.   09/05/2023   ramelteon (ROZEREM) 8 MG tablet TAKE 1 TABLET BY MOUTH AT BEDTIME. 90 tablet 2 09/05/2023   riboflavin (VITAMIN B-2) 100 MG TABS tablet Take 100 mg by mouth daily.   09/05/2023   timolol (TIMOPTIC) 0.5 % ophthalmic solution Place 1 drop into the right eye 2 (two) times daily.   09/06/2023   WIXELA INHUB 250-50 MCG/ACT AEPB Inhale 1 puff into  the lungs daily.   09/05/2023   Albuterol Sulfate 2.5 MG/0.5ML NEBU Inhale 0.5 mLs (2.5 mg total) into the lungs every 6 (six) hours. (Patient not taking: Reported on 09/07/2023) 60 mL 5 Not Taking   budesonide (PULMICORT) 0.5 MG/2ML nebulizer solution TAKE 2 ML (0.5 MG TOTAL) BY NEBULIZATION TWICE A DAY (Patient not taking: Reported on 09/07/2023) 360 mL 1 Not Taking   dorzolamide (TRUSOPT) 2 % ophthalmic solution Place 1 drop into the right eye 2 (two) times daily. (Patient not taking: Reported on 09/07/2023)   Not Taking   doxycycline (VIBRAMYCIN) 100 MG capsule Take 100 mg by mouth 2 (two) times daily. (Patient not taking: Reported on 09/07/2023)   Not Taking   fluconazole (DIFLUCAN) 100 MG tablet Take 1 tablet (100 mg total) by mouth daily. (Patient not taking: Reported on 09/07/2023) 4 tablet 0 Not Taking   gabapentin (NEURONTIN) 300 MG capsule  Take 300 mg by mouth at bedtime. (Patient not taking: Reported on 09/07/2023)   Not Taking   LOKELMA 10 g PACK packet daily. (Patient not taking: Reported on 09/07/2023)   Not Taking   pantoprazole (PROTONIX) 40 MG tablet Take 1 tablet (40 mg total) by mouth 2 (two) times daily. (Patient not taking: Reported on 09/07/2023) 60 tablet 0 Not Taking   sertraline (ZOLOFT) 25 MG tablet TAKE 1 TABLET (25 MG TOTAL) BY MOUTH DAILY. (Patient not taking: Reported on 09/07/2023) 90 tablet 2 Not Taking   sevelamer carbonate (RENVELA) 2.4 g PACK Take 2.4 g by mouth 3 (three) times daily with meals. (Patient not taking: Reported on 09/07/2023) 90 each 0 Not Taking   sucralfate (CARAFATE) 1 g tablet Take 1 g by mouth 4 (four) times daily. (Patient not taking: Reported on 09/07/2023)   Not Taking   valACYclovir (VALTREX) 500 MG tablet Take 1 tablet (500 mg total) by mouth daily. (Patient not taking: Reported on 09/07/2023) 7 tablet 0 Not Taking   Scheduled:   apixaban  2.5 mg Oral BID   [START ON 09/08/2023] Chlorhexidine Gluconate Cloth  6 each Topical Q0600   dorzolamide  1 drop Both Eyes BID   latanoprost  1 drop Right Eye QHS   mometasone-formoterol  2 puff Inhalation BID   timolol  1 drop Both Eyes Daily    Assessment: 88yo male c/o abdominal pain and reduced ostomy output, admitted for SBO, to transition from apixaban to heparin for Afib while awaiting possible surgical intervention.  Pt keeps refusing labs draws. D/w team about the feasibility of heparin. It was decided to resume apixaban for now and trend with CCS for procedure.  Goal of Therapy:  Heparin level 0.3-0.7 units/ml aPTT 66-102 seconds Monitor platelets by anticoagulation protocol: Yes   Plan:  Dc heparin Resume apixaban 2.5mg  PO BID Rx will monitor peripherally  Ulyses Southward, PharmD, BCIDP, AAHIVP, CPP Infectious Disease Pharmacist 09/07/2023 7:07 PM

## 2023-09-07 NOTE — Progress Notes (Signed)
 500 bolus completed     09/07/23 1420  Vitals  Temp 98.1 F (36.7 C)  BP (!) 117/56  MAP (mmHg) 70  BP Location Right Arm  BP Method Automatic  Patient Position (if appropriate) Lying  Pulse Rate 90  Resp 18  Level of Consciousness  Level of Consciousness Alert  MEWS COLOR  MEWS Score Color Green  Oxygen Therapy  SpO2 (!) 89 %  O2 Device Room Air  Pain Assessment  Pain Scale 0-10  Pain Score 6  MEWS Score  MEWS Temp 0  MEWS Systolic 0  MEWS Pulse 0  MEWS RR 0  MEWS LOC 0  MEWS Score 0

## 2023-09-08 ENCOUNTER — Ambulatory Visit: Payer: Medicare Other | Admitting: Family Medicine

## 2023-09-08 ENCOUNTER — Ambulatory Visit: Payer: Medicare Other | Admitting: Internal Medicine

## 2023-09-08 DIAGNOSIS — K56609 Unspecified intestinal obstruction, unspecified as to partial versus complete obstruction: Secondary | ICD-10-CM | POA: Diagnosis not present

## 2023-09-08 LAB — CBC
HCT: 32.3 % — ABNORMAL LOW (ref 39.0–52.0)
Hemoglobin: 10.6 g/dL — ABNORMAL LOW (ref 13.0–17.0)
MCH: 37.7 pg — ABNORMAL HIGH (ref 26.0–34.0)
MCHC: 32.8 g/dL (ref 30.0–36.0)
MCV: 114.9 fL — ABNORMAL HIGH (ref 80.0–100.0)
Platelets: 222 K/uL (ref 150–400)
RBC: 2.81 MIL/uL — ABNORMAL LOW (ref 4.22–5.81)
RDW: 17.9 % — ABNORMAL HIGH (ref 11.5–15.5)
WBC: 9.7 K/uL (ref 4.0–10.5)
nRBC: 0 % (ref 0.0–0.2)

## 2023-09-08 LAB — RENAL FUNCTION PANEL
Albumin: 3.1 g/dL — ABNORMAL LOW (ref 3.5–5.0)
Anion gap: 21 — ABNORMAL HIGH (ref 5–15)
BUN: 49 mg/dL — ABNORMAL HIGH (ref 8–23)
CO2: 23 mmol/L (ref 22–32)
Calcium: 8.3 mg/dL — ABNORMAL LOW (ref 8.9–10.3)
Chloride: 92 mmol/L — ABNORMAL LOW (ref 98–111)
Creatinine, Ser: 6.9 mg/dL — ABNORMAL HIGH (ref 0.61–1.24)
GFR, Estimated: 7 mL/min — ABNORMAL LOW (ref >60)
Glucose, Bld: 84 mg/dL (ref 70–99)
Phosphorus: 5.6 mg/dL — ABNORMAL HIGH (ref 2.5–4.6)
Potassium: 3.5 mmol/L (ref 3.5–5.1)
Sodium: 136 mmol/L (ref 135–145)

## 2023-09-08 LAB — HEPATITIS B SURFACE ANTIBODY, QUANTITATIVE: Hep B S AB Quant (Post): 19.5 m[IU]/mL

## 2023-09-08 MED ORDER — ALTEPLASE 2 MG IJ SOLR: 2.0000 mg | Freq: Once | INTRAMUSCULAR | Status: DC | PRN

## 2023-09-08 MED ORDER — HEPARIN SODIUM (PORCINE) 1000 UNIT/ML DIALYSIS: 1000.0000 [IU] | INTRAMUSCULAR | Status: DC | PRN

## 2023-09-08 MED ORDER — SODIUM CHLORIDE 0.9 % IV BOLUS
500.0000 mL | Freq: Once | INTRAVENOUS | Status: AC
  Administered 2023-09-08: 500 mL via INTRAVENOUS

## 2023-09-08 MED ORDER — MIDODRINE HCL 5 MG PO TABS
15.0000 mg | ORAL_TABLET | Freq: Once | ORAL | Status: AC
  Administered 2023-09-08: 15 mg via ORAL

## 2023-09-08 MED ORDER — NEPRO/CARBSTEADY PO LIQD: 237.0000 mL | ORAL | 1 refills | Status: AC | PRN

## 2023-09-08 MED ORDER — NEPRO/CARBSTEADY PO LIQD: 237.0000 mL | ORAL | Status: DC | PRN

## 2023-09-08 MED ORDER — ANTICOAGULANT SODIUM CITRATE 4% (200MG/5ML) IV SOLN
5.0000 mL | Status: DC | PRN
  Filled 2023-09-08: qty 5

## 2023-09-08 MED ORDER — MIDODRINE HCL 5 MG PO TABS
10.0000 mg | ORAL_TABLET | Freq: Three times a day (TID) | ORAL | Status: DC
  Administered 2023-09-08 (×2): 10 mg via ORAL
  Filled 2023-09-08 (×2): qty 2

## 2023-09-08 MED ORDER — MOMETASONE FURO-FORMOTEROL FUM 200-5 MCG/ACT IN AERO: 2.0000 | INHALATION_SPRAY | Freq: Two times a day (BID) | RESPIRATORY_TRACT | 1 refills | Status: DC

## 2023-09-08 MED ORDER — LIDOCAINE HCL (PF) 1 % IJ SOLN: 5.0000 mL | INTRAMUSCULAR | Status: DC | PRN

## 2023-09-08 MED ORDER — PENTAFLUOROPROP-TETRAFLUOROETH EX AERO: 1.0000 | INHALATION_SPRAY | CUTANEOUS | Status: DC | PRN

## 2023-09-08 MED ORDER — LIDOCAINE-PRILOCAINE 2.5-2.5 % EX CREA: 1.0000 | TOPICAL_CREAM | CUTANEOUS | Status: DC | PRN

## 2023-09-08 NOTE — Progress Notes (Signed)
 Consent form signed for hemodialysis and placed in patients chart. Ileostomy secured and no output needed to be emptied at this time.

## 2023-09-08 NOTE — Plan of Care (Signed)
  Problem: Education: Goal: Knowledge of General Education information will improve Description: Including pain rating scale, medication(s)/side effects and non-pharmacologic comfort measures 09/08/2023 2308 by Curly Rim, RN Outcome: Adequate for Discharge 09/08/2023 2307 by Curly Rim, RN Outcome: Progressing   Problem: Health Behavior/Discharge Planning: Goal: Ability to manage health-related needs will improve Outcome: Adequate for Discharge   Problem: Clinical Measurements: Goal: Ability to maintain clinical measurements within normal limits will improve 09/08/2023 2308 by Curly Rim, RN Outcome: Adequate for Discharge 09/08/2023 2307 by Curly Rim, RN Outcome: Progressing Goal: Will remain free from infection 09/08/2023 2308 by Curly Rim, RN Outcome: Adequate for Discharge 09/08/2023 2307 by Curly Rim, RN Outcome: Progressing Goal: Diagnostic test results will improve 09/08/2023 2308 by Curly Rim, RN Outcome: Adequate for Discharge 09/08/2023 2307 by Curly Rim, RN Outcome: Progressing Goal: Respiratory complications will improve 09/08/2023 2308 by Curly Rim, RN Outcome: Adequate for Discharge 09/08/2023 2307 by Curly Rim, RN Outcome: Progressing Goal: Cardiovascular complication will be avoided 09/08/2023 2308 by Curly Rim, RN Outcome: Adequate for Discharge 09/08/2023 2307 by Curly Rim, RN Outcome: Progressing   Problem: Activity: Goal: Risk for activity intolerance will decrease 09/08/2023 2308 by Curly Rim, RN Outcome: Adequate for Discharge 09/08/2023 2307 by Curly Rim, RN Outcome: Progressing   Problem: Nutrition: Goal: Adequate nutrition will be maintained 09/08/2023 2308 by Curly Rim, RN Outcome: Adequate for Discharge 09/08/2023 2307 by Curly Rim, RN Outcome: Progressing   Problem: Coping: Goal: Level of anxiety will decrease 09/08/2023 2308 by Curly Rim, RN Outcome:  Adequate for Discharge 09/08/2023 2307 by Curly Rim, RN Outcome: Progressing   Problem: Elimination: Goal: Will not experience complications related to bowel motility 09/08/2023 2308 by Curly Rim, RN Outcome: Adequate for Discharge 09/08/2023 2307 by Curly Rim, RN Outcome: Progressing Goal: Will not experience complications related to urinary retention 09/08/2023 2308 by Curly Rim, RN Outcome: Adequate for Discharge 09/08/2023 2307 by Curly Rim, RN Outcome: Progressing   Problem: Pain Managment: Goal: General experience of comfort will improve and/or be controlled 09/08/2023 2308 by Curly Rim, RN Outcome: Adequate for Discharge 09/08/2023 2307 by Curly Rim, RN Outcome: Progressing   Problem: Safety: Goal: Ability to remain free from injury will improve 09/08/2023 2308 by Curly Rim, RN Outcome: Adequate for Discharge 09/08/2023 2307 by Curly Rim, RN Outcome: Progressing   Problem: Skin Integrity: Goal: Risk for impaired skin integrity will decrease 09/08/2023 2308 by Curly Rim, RN Outcome: Adequate for Discharge 09/08/2023 2307 by Curly Rim, RN Outcome: Progressing

## 2023-09-08 NOTE — Progress Notes (Signed)
 Pt had stool in his ileostomy---offered to empy the puch prior to going to the floor--gathered supplies---pt staed "just wait--all I want to do is get back to my room and eat"---pouch was not leaking so put pt in for transportation and called report to this pts primary RN---then this pt stated "I need this emptied--its burning"--- I explained to him that as soon as I finished the pt next door I would empty for him--he was taken out of trnasportation for this---approx 10 minutes later is put him back in and emptied the pouch--resealed it and noted approx 300cc of yellow loose stoll--continued to c/o "burning"--sent a secure chat to the primary RN to evaluate it further upon his arrival to the floor---

## 2023-09-08 NOTE — Discharge Summary (Signed)
 Physician Discharge Summary  Patient ID: Joshua Riley MRN: [id number] DOB/AGE: Jul 21, 1935 88 y.o.  Admit date: 09/06/2023 Discharge date: 09/08/2023  Admission Diagnoses:  Discharge Diagnoses:  Principal Problem:   SBO (small bowel obstruction) (HCC) Active Problems:   Colostomy status (HCC)   Nephrostomy present (HCC)   Atrial fibrillation, persistent (HCC)   End-stage renal disease on hemodialysis (HCC)   Ulcerative colitis (HCC)   History of COPD   History of anemia due to chronic kidney disease   Discharged Condition: stable  Hospital Course:  Patient is an 88 year old male with past medical history significant for ESRD on hemodialysis (Monday, Wednesday and Friday), paroxysmal atrial fibrillation, depression, 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  Patient was admitted last December and discharged on 07/21/2023 after treat for sepsis secondary to infected groin abscess (managed conservatively with IV antibiotics, meropenem, for ESBL E. Coli; and drain placement.  The drain subsequently fell off and interventional radiologist plans to observe at this time).  Patient was also recently treated with antibiotics for right lower extremity cellulitis.  Patient was admitted with left lower quadrant abdominal pain, with associated nausea.  Patient's ostomy output was significantly decreased.  There was some concerns for possible small bowel obstruction on presentation.  Potassium was also elevated.  Patient was admitted for further assessment and management.  Surgical team was consulted to assist with possible small bowel obstruction.  Nephrology team was consulted for hemodialysis management.  Patient was managed supportively.  Patient insists on being discharged back home today.  Surgery team has  indicated they should be okay to discharge patient home.  Patient will follow-up with primary care provider, surgery team and nephrology team on discharge.  During the hospital stay, hypotensive episodes noted that responded to volume resuscitation.   Possible small bowel obstruction: - General Surgery assisted with patient's management. -Patient was managed supportively.     -Symptoms seem to have resolved significantly.   -Patient insists on being discharged back home.       ESRD on hemodialysis: -On hemodialysis Monday, Wednesday and Friday. -Patient underwent hemodialysis during the hospital stay.  Hyperkalemia: -Managed with Lokelma and hemodialysis.   Paroxysmal atrial fibrillation: -Continue anticoagulation (patient is on heparin drip (. -Rate controlled.     Chronic macrocytic anemia: -Hemoglobin of 11.2 g/dL (stable). -Patient takes B12 and iron supplements.   -Folate is greater than 40 and vitamin B12 of 4157.   -Last iron panel revealed iron of 144, TIBC of 349 and ferritin of 1518 (done on 07/25/2023). -PCP may consider referral to heme-onc team on discharge.   Recent admission for sepsis secondary to infected groin abscess and presacral fluid collection: -Treated with drain placement and IV antibiotics for 4 weeks.  Drain eventually fell off and radiologist plans to monitor for now.   Obstructive uropathy with chronic left-sided percutaneous nephrostomy tube:   Ulcerative colitis: -Status post colectomy in 2011 with ileostomy ileal pouch and anal anastomosis surgery in 2012.   History of depression:    Recently treated for right lower extremity cellulitis: -Will continue to observe.   Right-sided hydroureter: -Radiologist recommended to check with cystoscopy to rule out any bladder mass. -Needs to follow-up with urology as outpatient.   History of GERD: -On PPI.   History of COPD: -Continue home inhalers.   Hyperkalemia: -Lokelma 10 g x 1 dose. -Awaiting  hemodialysis.    Consults: nephrology and general surgery  Significant Diagnostic Studies:  CT ABDOMEN AND PELVIS WITH CONTRAST:   TECHNIQUE: Multidetector CT imaging of the abdomen and pelvis was performed using the standard protocol following bolus administration of intravenous contrast.   RADIATION DOSE REDUCTION: This exam was performed according to the departmental dose-optimization program which includes automated exposure control, adjustment of the mA and/or kV according to patient size and/or use of iterative reconstruction technique.   CONTRAST:  OMNIPAQUE IOHEXOL 300 MG/ML  SOLN   COMPARISON:  CT abdomen and pelvis 07/31/2023   FINDINGS: Lower chest: No acute abnormality.   Hepatobiliary: Hepatic steatosis. Cholelithiasis without evidence of acute cholecystitis. No biliary dilation.   Pancreas: Unremarkable.   Spleen: Unremarkable.   Adrenals/Urinary Tract: Unremarkable adrenal glands. Atrophic native kidneys. Percutaneous nephrostomy tube in the left kidney. Moderate hydroureter of the distal right ureter. Irregular bladder wall thickening greater on the left. The bladder is nondistended.   Stomach/Bowel: Distended stomach. Distended small bowel in the left hemiabdomen with abrupt transition point distal to an area of fecalized small bowel (circa series 5/image 63). No bowel wall thickening or evidence of bowel ischemia. The small bowel downstream from the area of obstruction extending to the right abdominal ileostomy is decompressed. Postoperative change of colectomy.   Vascular/Lymphatic: Aortic atherosclerosis. No enlarged abdominal or pelvic lymph nodes.   Reproductive: Penile prosthesis.  No acute abnormality.   Other: Interval removal of the presacral drain. The presacral fluid collection is unchanged measuring 3.7 x 5.5 cm (using similar measuring technique). Mild adjacent fat stranding. Small amount of free fluid superior to the bladder. No  free intraperitoneal air.   Musculoskeletal: No acute fracture. 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   IMPRESSION: 1. Small-bowel obstruction with abrupt transition point in the left anterior abdomen. 2. Irregular bladder wall thickening on the left. Moderate right hydroureter distally. Query ureteral obstruction from bladder mass. Cystoscopy is recommended if not performed previously. 3. Percutaneous nephrostomy tube in the left kidney. Interval decompression of the left hydroureter. 4. Interval removal of the presacral drain. The presacral fluid collection is unchanged measuring 3.7 x 5.5 cm (using similar measuring technique). 5. Hepatic steatosis. 6. Cholelithiasis without evidence of acute cholecystitis. 7. Aortic Atherosclerosis (ICD10-I70.0).     Electronically Signed   By: Minerva Fester M.D.   On: 09/06/2023 20:06     PORTABLE ABDOMEN - 1 VIEW:   COMPARISON:  07/15/2023 and abdomen and pelvis CT dated 09/06/2023.   FINDINGS: Paucity of intestinal gas with no dilated bowel loops visualized. Stable right pelvic penile prosthesis reservoir. Stable right lower quadrant ostomy. Stable left percutaneous nephrostomy catheter. Lumbar and lower thoracic spine degenerative changes.   IMPRESSION: No acute abnormality. Paucity of intestinal gas with no dilated bowel loops visualized.     Electronically Signed   By: Beckie Salts M.D.   On: 09/07/2023 16:34    Discharge Exam: Blood pressure (!) 97/53, pulse (!) 57, temperature 98.3 F (36.8 C), resp. rate (!) 31, height 5\' 8"  (1.727 m), weight 71.5 kg, SpO2 96%.   Disposition: Discharge disposition: 01-Home or Self Care       Discharge Instructions     Diet - low sodium heart healthy   Complete by: As directed    Discharge wound care:   Complete by: As directed    Continue current wound care regimen   Increase activity slowly   Complete by: As directed       Allergies  as of  09/08/2023       Reactions   Cephalosporins Rash   Ciprofloxacin Itching, Rash   Baclofen Other (See Comments)   Altered mental status, after accidental overdose        Medication List     STOP taking these medications    Albuterol Sulfate 2.5 MG/0.5ML Nebu   budesonide 0.5 MG/2ML nebulizer solution Commonly known as: PULMICORT   doxycycline 100 MG capsule Commonly known as: VIBRAMYCIN   fluconazole 100 MG tablet Commonly known as: DIFLUCAN   gabapentin 300 MG capsule Commonly known as: NEURONTIN   Lokelma 10 g Pack packet Generic drug: sodium zirconium cyclosilicate   magnesium oxide 400 (240 Mg) MG tablet Commonly known as: MAG-OX   melatonin 3 MG Tabs tablet   Omega-3 Fish Oil 500 MG Caps   ramelteon 8 MG tablet Commonly known as: ROZEREM   riboflavin 100 MG Tabs tablet Commonly known as: VITAMIN B-2   sertraline 25 MG tablet Commonly known as: ZOLOFT   sevelamer carbonate 2.4 g Pack Commonly known as: RENVELA   sucralfate 1 g tablet Commonly known as: CARAFATE   valACYclovir 500 MG tablet Commonly known as: Valtrex   Wixela Inhub 250-50 MCG/ACT Aepb Generic drug: fluticasone-salmeterol Replaced by: mometasone-formoterol 200-5 MCG/ACT Aero       TAKE these medications    dorzolamide 2 % ophthalmic solution Commonly known as: TRUSOPT Place 1 drop into the right eye 2 (two) times daily.   Eliquis 2.5 MG Tabs tablet Generic drug: apixaban Take 1 tablet (2.5 mg total) by mouth 2 (two) times daily.   feeding supplement (NEPRO CARB STEADY) Liqd Take 237 mLs by mouth as needed (missed meal during dialysis.).   folic acid 1 MG tablet Commonly known as: FOLVITE Take 1 tablet (1 mg total) by mouth daily.   latanoprost 0.005 % ophthalmic solution Commonly known as: XALATAN Place 1 drop into the right eye at bedtime.   midodrine 10 MG tablet Commonly known as: PROAMATINE Take 1 tablet (10 mg total) by mouth 3 (three) times daily with  meals. What changed: Another medication with the same name was removed. Continue taking this medication, and follow the directions you see here.   mometasone-formoterol 200-5 MCG/ACT Aero Commonly known as: DULERA Inhale 2 puffs into the lungs 2 (two) times daily. Replaces: Wixela Inhub 250-50 MCG/ACT Aepb   pantoprazole 40 MG tablet Commonly known as: PROTONIX Take 1 tablet (40 mg total) by mouth 2 (two) times daily.   timolol 0.5 % ophthalmic solution Commonly known as: TIMOPTIC Place 1 drop into the right eye 2 (two) times daily.   VITAMIN B-12 PO Take 1 capsule by mouth in the morning and at bedtime.               Discharge Care Instructions  (From admission, onward)           Start     Ordered   09/08/23 0000  Discharge wound care:       Comments: Continue current wound care regimen   09/08/23 1834            Time spent: 35 minutes.   SignedBarnetta Chapel 09/08/2023, 6:34 PM

## 2023-09-08 NOTE — Plan of Care (Signed)

## 2023-09-08 NOTE — Progress Notes (Signed)
 Patient discharge home with daughter. AVS reviewed with patient and written copy given to patient and he verbalized understanding. Patient aware of where to pick up prescriptions. All belongings sent home with patient. Patient escorted off unit in wheelchair by staff. Patient and patient daughter voiced no concerns or questions at discharge.

## 2023-09-08 NOTE — Progress Notes (Signed)
 Progress Note     Subjective: Started having significant output from ileostomy yesterday - mostly liquid but starting to bulk up. No n/v/abdominal distension with clears   Objective: Vital signs in last 24 hours: Temp:  [97.8 F (36.6 C)-99.2 F (37.3 C)] 98.4 F (36.9 C) (03/04 0040) Pulse Rate:  [57-119] 70 (03/04 0439) Resp:  [16-18] 17 (03/04 0439) BP: (51-147)/(30-123) 88/54 (03/04 0439) SpO2:  [89 %-100 %] 97 % (03/04 0439) Last BM Date : 09/07/23  Intake/Output from previous day: 03/03 0701 - 03/04 0700 In: 1690.2 [P.O.:1360; I.V.:84.9; IV Piggyback:245.3] Out: 2975 [Stool:2975] Intake/Output this shift: No intake/output data recorded.  PE: General: pleasant, WD, male who is laying in bed in NAD Lungs:  Respiratory effort nonlabored on room air Abd: soft, NT, ND. Erythema surrounding ileostomy which has green liquid stool in bag. Well healed midline surgical scar MSK: all 4 extremities are symmetrical with no cyanosis, clubbing, or edema. Skin: warm and dry Psych: A&Ox3 with an appropriate affect.    Lab Results:  Recent Labs    09/06/23 1808 09/07/23 0609  WBC 11.2* 7.2  HGB 11.3* 11.2*  HCT 34.0* 33.4*  PLT 197 188   BMET Recent Labs    09/06/23 1808 09/07/23 0609  NA 138 137  K 5.8* 6.0*  CL 94* 93*  CO2 24 22  GLUCOSE 124* 95  BUN 86* 90*  CREATININE 9.32* 9.58*  CALCIUM 9.2 8.8*   PT/INR No results for input(s): "LABPROT", "INR" in the last 72 hours. CMP     Component Value Date/Time   NA 137 09/07/2023 0609   NA 138 04/10/2023 1541   K 6.0 (H) 09/07/2023 0609   CL 93 (L) 09/07/2023 0609   CO2 22 09/07/2023 0609   GLUCOSE 95 09/07/2023 0609   BUN 90 (H) 09/07/2023 0609   BUN 19 04/10/2023 1541   CREATININE 9.58 (H) 09/07/2023 0609   CALCIUM 8.8 (L) 09/07/2023 0609   PROT 7.1 09/07/2023 0609   ALBUMIN 3.7 09/07/2023 0609   AST 16 09/07/2023 0609   ALT 15 09/07/2023 0609   ALKPHOS 96 09/07/2023 0609   BILITOT 1.0 09/07/2023  0609   GFRNONAA 5 (L) 09/07/2023 0609   Lipase     Component Value Date/Time   LIPASE 49 09/06/2023 1808       Studies/Results: DG Abd Portable 1V Result Date: 09/07/2023 CLINICAL DATA:  Small-bowel obstruction. EXAM: PORTABLE ABDOMEN - 1 VIEW COMPARISON:  07/15/2023 and abdomen and pelvis CT dated 09/06/2023. FINDINGS: Paucity of intestinal gas with no dilated bowel loops visualized. Stable right pelvic penile prosthesis reservoir. Stable right lower quadrant ostomy. Stable left percutaneous nephrostomy catheter. Lumbar and lower thoracic spine degenerative changes. IMPRESSION: No acute abnormality. Paucity of intestinal gas with no dilated bowel loops visualized. Electronically Signed   By: Beckie Salts M.D.   On: 09/07/2023 16:34   CT ABDOMEN PELVIS W CONTRAST Result Date: 09/06/2023 CLINICAL DATA:  Acute nonlocalized abdominal pain. Query small-bowel obstruction. EXAM: CT ABDOMEN AND PELVIS WITH CONTRAST TECHNIQUE: Multidetector CT imaging of the abdomen and pelvis was performed using the standard protocol following bolus administration of intravenous contrast. RADIATION DOSE REDUCTION: This exam was performed according to the departmental dose-optimization program which includes automated exposure control, adjustment of the mA and/or kV according to patient size and/or use of iterative reconstruction technique. CONTRAST:  OMNIPAQUE IOHEXOL 300 MG/ML  SOLN COMPARISON:  CT abdomen and pelvis 07/31/2023 FINDINGS: Lower chest: No acute abnormality. Hepatobiliary: Hepatic steatosis. Cholelithiasis without  evidence of acute cholecystitis. No biliary dilation. Pancreas: Unremarkable. Spleen: Unremarkable. Adrenals/Urinary Tract: Unremarkable adrenal glands. Atrophic native kidneys. Percutaneous nephrostomy tube in the left kidney. Moderate hydroureter of the distal right ureter. Irregular bladder wall thickening greater on the left. The bladder is nondistended. Stomach/Bowel: Distended stomach.  Distended small bowel in the left hemiabdomen with abrupt transition point distal to an area of fecalized small bowel (circa series 5/image 63). No bowel wall thickening or evidence of bowel ischemia. The small bowel downstream from the area of obstruction extending to the right abdominal ileostomy is decompressed. Postoperative change of colectomy. Vascular/Lymphatic: Aortic atherosclerosis. No enlarged abdominal or pelvic lymph nodes. Reproductive: Penile prosthesis.  No acute abnormality. Other: Interval removal of the presacral drain. The presacral fluid collection is unchanged measuring 3.7 x 5.5 cm (using similar measuring technique). Mild adjacent fat stranding. Small amount of free fluid superior to the bladder. No free intraperitoneal air. Musculoskeletal: No acute fracture. 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 IMPRESSION: 1. Small-bowel obstruction with abrupt transition point in the left anterior abdomen. 2. Irregular bladder wall thickening on the left. Moderate right hydroureter distally. Query ureteral obstruction from bladder mass. Cystoscopy is recommended if not performed previously. 3. Percutaneous nephrostomy tube in the left kidney. Interval decompression of the left hydroureter. 4. Interval removal of the presacral drain. The presacral fluid collection is unchanged measuring 3.7 x 5.5 cm (using similar measuring technique). 5. Hepatic steatosis. 6. Cholelithiasis without evidence of acute cholecystitis. 7. Aortic Atherosclerosis (ICD10-I70.0). Electronically Signed   By: Minerva Fester M.D.   On: 09/06/2023 20:06    Anti-infectives: Anti-infectives (From admission, onward)    None        Assessment/Plan 88 yo male with ESRD, A fib, UC presenting with pSBO  - ileostomy output charted as 2975 ml/24h - xray yesterday without dilated loops of bowel - tolerated clear liquids, advance to reg - received IVF boluses yesterday for low BP. Has hypotension at baseline  for which he take midodrine - nephrology following (HD MWF)  Given volume of output recommend continuing to trend output on regular diet today inpatient. Patient states he wants to discharge. We discussed reasoning for continued admission   FEN - reg VTE - eliquis start 3/3 2000 ID - no issues  I reviewed last 24 h vitals and pain scores, last 48 h intake and output, last 24 h labs and trends, and last 24 h imaging results.    LOS: 2 days   Eric Form, [facility] 09/08/2023, 8:11 AM Please see Amion for pager number during day hours 7:00am-4:30pm

## 2023-09-08 NOTE — Plan of Care (Signed)
  Problem: Clinical Measurements: Goal: Will remain free from infection Outcome: Progressing Goal: Diagnostic test results will improve Outcome: Progressing Goal: Respiratory complications will improve Outcome: Progressing Goal: Cardiovascular complication will be avoided Outcome: Progressing   Problem: Activity: Goal: Risk for activity intolerance will decrease Outcome: Progressing   Problem: Coping: Goal: Level of anxiety will decrease Outcome: Progressing   

## 2023-09-08 NOTE — Progress Notes (Signed)
Pt transported to HD via bed by transportation staff.  

## 2023-09-08 NOTE — Progress Notes (Signed)
   09/08/23 1759  Vitals  Temp 98.3 F (36.8 C)  Pulse Rate (!) 57  Resp (!) 31  BP (!) 97/53  SpO2 96 %  O2 Device Room Air  Weight 71.5 kg  Post Treatment  Dialyzer Clearance Lightly streaked  Hemodialysis Intake (mL) 700 mL  Liters Processed 56.8  Fluid Removed (mL) 0 mL  Tolerated HD Treatment Yes  AVG/AVF Arterial Site Held (minutes) 10 minutes  AVG/AVF Venous Site Held (minutes) 10 minutes   Received patient in bed to unit.  Alert and oriented.  Informed consent signed and in chart.   TX duration:2.45---pt signed off AMA at 2 hours and 45 minutes---nephrology is aware  Patient tolerated well.  Transported back to the room  Alert, without acute distress.  Hand-off given to patient's nurse.  Pt titrated down to 3l/[place]---her home o2 therapy---nephrology is aware--sao2 99%--breathing much easier now Access used: RIJDLC Access issues: no complications  Total UF removed: 4000 Medication(s) given: none   [facility]

## 2023-09-08 NOTE — Consult Note (Signed)
 WOC Nurse ostomy consult note Stoma type/location: Ileostomy on RLQ Stomal assessment/size: 38 x 20 mm Peristomal assessment: Irritated, redness. Dermatitis because of the contact of stool. Bag was leaking. Treatment options for stomal/peristomal skin: barrier [phone] Output 80 ml green soft stools. Ostomy pouching: 2pc.  Softconvex #[id number] and bag #[id number].  Ordered supplies for the room.  Education provided: Old ostomy. The patient already know how to care of his ostomy.  WOC Nurse Consult Note: Reason for Consult: DTPI wound on sacrum Wound type: Wound healed. Pressure Injury POA: Yes Dressing procedure/placement/frequency: Apply foam dressing if the pt stays at the same position a long period of time to prevent further injuries. Change every 3 days.  WOC team will not plan to follow further.  Please reconsult if further assistance is needed. Thank-you,  Denyse Amass BSN, RN, [employer], WOC  (Pager: [phone])

## 2023-09-08 NOTE — Progress Notes (Signed)
  [facility] Progress Note   Subjective: Seen and examined at bedside. Good output from ostomy. Received IVF for No complaints. Plan for dialysis today and wants to go home after.   Objective Vitals:   09/08/23 0001 09/08/23 0040 09/08/23 0439 09/08/23 0813  BP: (!) 74/39 (!) 88/47 (!) 88/54 98/69  Pulse: 73 88 70 79  Resp: 16 16 17 20   Temp: 98.6 F (37 C) 98.4 F (36.9 C)  (!) 97.5 F (36.4 C)  TempSrc: Oral Oral  Oral  SpO2: 100% 98% 97% 92%  Weight:      Height:         Additional Objective Labs: Basic Metabolic Panel: Recent Labs  Lab 09/06/23 1808 09/07/23 0609  NA 138 137  K 5.8* 6.0*  CL 94* 93*  CO2 24 22  GLUCOSE 124* 95  BUN 86* 90*  CREATININE 9.32* 9.58*  CALCIUM 9.2 8.8*   CBC: Recent Labs  Lab 09/06/23 1808 09/07/23 0609 09/08/23 0804  WBC 11.2* 7.2 9.7  NEUTROABS 9.3* 5.8  --   HGB 11.3* 11.2* 10.6*  HCT 34.0* 33.4* 32.3*  MCV 115.6* 113.6* 114.9*  PLT 197 188 222   Blood Culture    Component Value Date/Time   SDES ABSCESS 07/13/2023 1309   SPECREQUEST Normal 07/13/2023 1309   CULT  07/13/2023 1309    ABUNDANT ESCHERICHIA COLI Confirmed Extended Spectrum Beta-Lactamase Producer (ESBL).  In bloodstream infections from ESBL organisms, carbapenems are preferred over piperacillin/tazobactam. They are shown to have a lower risk of mortality. NO ANAEROBES ISOLATED SEE SEPARATE REPORT Performed at [facility], [address] [place], [place] [address]    REPTSTATUS 07/31/2023 FINAL 07/13/2023 1309     Physical Exam General: Alert, nad  Heart: RRR Lungs: Clear, normal wob Abdomen: soft; ostomy in place Extremities: No LE edema Dialysis Access: L thigh AVG +bruit   Medications:  anticoagulant sodium citrate      apixaban  2.5 mg Oral BID   Chlorhexidine Gluconate Cloth  6 each Topical Q0600   dorzolamide  1 drop Both Eyes BID   latanoprost  1 drop Right Eye QHS   midodrine  10 mg Oral TID WC    mometasone-formoterol  2 puff Inhalation BID   timolol  1 drop Both Eyes Daily    Dialysis Orders:  MWF [facility]  2h  B350   72kg   2K bath  L thigh AVG  Heparin none - getting to dry wt, last HD 2/28 w/ post wt 71.8kg - rocaltrol 0.5 mcg - mircera 75 mcg IV q2, last 2/28, due 3/14  Assessment/Plan: Small bowel obstruction - w/ RLQ abd pains. Gen surgery consulting. Improved. Now with output from ostomy. Clinically improved. Tolerating PO Hyperkalemia - K+ 6.0 HD today .  ESRD - on HD MWF. Has not missed HD. HD as above.  BP - BP's run on the low side, takes 5- 10 mg midodrine tid. S/p IVF bolus  Volume - looks a bit dry and wt's are slightly under the dry wt. Keep even w/ HD.  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 Secondary hyperparathyroidism - CCa in range, add on phos. Resume binders when eating.  Chronic L sided PCN tube  H/o UC - s/p colectomy in 2011 w/ ileostomy ileal pouch/ anal anastomosis in 2012.  COPD - on home inhalers   Tomasa Blase PA-C [facility] 09/08/2023,1:31 PM

## 2023-09-09 ENCOUNTER — Telehealth: Payer: Self-pay | Admitting: Nephrology

## 2023-09-09 NOTE — Progress Notes (Signed)
 Late Note Entry- September 09, 2023  Pt was d/c late yesterday afternoon. Contacted [facility] this morning to be advised of pt's d/c date and that pt should resume today.   Olivia Canter Renal Navigator [phone]

## 2023-09-09 NOTE — Telephone Encounter (Signed)
 Transition of care contact from inpatient facility  Date of Discharge: 09/08/23 Date of Contact:09/09/23 Method of contact: Phone  Attempted to contact patient to discuss transition of care from inpatient admission. Patient did not answer the phone. Will continue outreach attempts.

## 2023-09-09 NOTE — Discharge Planning (Signed)
 [facility] Patient Discharge Orders- [facility] CLINIC: [place]  Patient's name: Jessey Stehlin Admit/DC Dates: 09/06/2023 - 09/08/2023  Discharge Diagnoses: Possible SBO - resolved     Recent Labs  Lab 09/08/23 0804 09/08/23 1948  HGB 10.6*  --   K  --  3.5  CALCIUM  --  8.3*  PHOS  --  5.6*  ALBUMIN  --  3.1*   Aranesp: Given: --   Date of last dose/amount: --   PRBC's Given: -- Date/# of units: - - ESA dose for discharge: -No change    Outpatient Dialysis Orders:  No change to OP dialysis orders    Access intervention/Change: ---   Medication Orders:   Lab Orders:    OTHER/APPTS    Completed by: Tomasa Blase PA-C   D/C Meds to be reconciled by nurse after every discharge.    Reviewed by: MD:______ RN_______

## 2023-09-21 ENCOUNTER — Encounter

## 2023-09-21 ENCOUNTER — Other Ambulatory Visit: Payer: Self-pay

## 2023-09-21 NOTE — Telephone Encounter (Signed)
 [facility] is requesting a refill on medication midodrine. This medication was prescribed by another provider. Would Joshua Bridegroom, NP like to refill this medication? Please address

## 2023-09-21 NOTE — Progress Notes (Unsigned)
 This encounter was created in error - please disregard.

## 2023-09-24 ENCOUNTER — Other Ambulatory Visit: Payer: Self-pay | Admitting: *Deleted

## 2023-09-24 ENCOUNTER — Telehealth: Payer: Self-pay | Admitting: Family Medicine

## 2023-09-24 MED ORDER — MIDODRINE HCL 10 MG PO TABS: 10.0000 mg | ORAL_TABLET | Freq: Three times a day (TID) | ORAL | 0 refills | Status: DC

## 2023-09-24 MED ORDER — MIDODRINE HCL 10 MG PO TABS: 10.0000 mg | ORAL_TABLET | Freq: Every day | ORAL | Status: DC

## 2023-09-24 NOTE — Telephone Encounter (Unsigned)
 Copied from CRM [id number]. Topic: General - Other >> Sep 24, 2023 12:31 PM Gurney Maxin H wrote: Reason for CRM: Cindy with [facility] [employer] [facility] calling to verify if office received physician order for ostomy supplies for patient faxed to office on 3/13.Arline Asp [phone]

## 2023-09-25 NOTE — Telephone Encounter (Signed)
 I called the number below and was on for an extended period of time. I have not received any forms from this company.

## 2023-10-08 ENCOUNTER — Telehealth: Payer: Self-pay

## 2023-10-08 NOTE — Telephone Encounter (Signed)
 Called and lm on Gretchen vm with [facility].   Copied from CRM [id number]. Topic: Clinical - Home Health Verbal Orders >> Oct 06, 2023  1:45 PM Arley Phenix D wrote: Caller/Agency: Gretchen from [facility] with [employer] Number: [phone] Service Requested: resert visit  Frequency: 1 week Any new concerns about the patient? No

## 2023-10-12 ENCOUNTER — Telehealth: Payer: Self-pay | Admitting: Family Medicine

## 2023-10-12 NOTE — Telephone Encounter (Signed)
 Received faxed  document Home Health Certificate (Order ID [id number]), to be filled out by provider. Patient requested to send it back via Fax . Document is located in providers tray at front office.Please advise

## 2023-10-13 NOTE — Telephone Encounter (Signed)
Paperwork filled out and faxed back.

## 2023-10-15 ENCOUNTER — Telehealth: Payer: Self-pay

## 2023-10-15 NOTE — Telephone Encounter (Signed)
 Returned the call to [employer] and VO given.  Copied from CRM [id number]. Topic: Clinical - Home Health Verbal Orders >> Oct 15, 2023 10:03 AM Alcus Dad wrote: Caller/Agency: [facility] Callback Number: [phone] Service Requested: Physical Therapy Frequency: Need verbal order to move re certification to tomorrow Any new concerns about the patient? Will know more tomorrow

## 2023-10-16 ENCOUNTER — Telehealth: Payer: Self-pay

## 2023-10-16 NOTE — Telephone Encounter (Signed)
 Called and spoke to Susank and vo given.  Copied from CRM [id number]. Topic: General - Other >> Oct 16, 2023  3:28 PM Rodman Pickle T wrote: Reason for CRM: charri from home health is calling in regarding patient she is requesting verbal orders fr patient once a week for four weeks she would like a call back regarding this at [phone]

## 2023-10-19 ENCOUNTER — Telehealth: Payer: Self-pay | Admitting: Family Medicine

## 2023-10-19 NOTE — Telephone Encounter (Signed)
 Patient's daughter came in and stated that she was informed by radiology that patient is in need of another abdominal drain. Patient's daughter is requesting orders be placed for abdominal drain which he has had before. Patient has an OV with PCP on 10/27/23 @ 3pm to discuss.

## 2023-10-21 NOTE — Telephone Encounter (Signed)
 It appears he is scheduled for tomorrow for this- I'm ok with verbal orders for this if needed- since already scheduled though not 100% sure what they need

## 2023-10-22 ENCOUNTER — Other Ambulatory Visit (HOSPITAL_COMMUNITY): Payer: Self-pay | Admitting: Interventional Radiology

## 2023-10-22 ENCOUNTER — Ambulatory Visit (HOSPITAL_COMMUNITY)
Admission: RE | Admit: 2023-10-22 | Discharge: 2023-10-22 | Disposition: A | Discharge status: Home / Self Care | Source: Ambulatory Visit | Attending: Interventional Radiology | Admitting: Interventional Radiology

## 2023-10-22 DIAGNOSIS — N1339 Other hydronephrosis: Secondary | ICD-10-CM

## 2023-10-22 MED ORDER — LIDOCAINE HCL 1 % IJ SOLN
INTRAMUSCULAR | Status: AC
  Filled 2023-10-22: qty 20

## 2023-10-22 MED ORDER — IOHEXOL 300 MG/ML  SOLN
50.0000 mL | Freq: Once | INTRAMUSCULAR | Status: AC | PRN
  Administered 2023-10-22: 10 mL

## 2023-10-22 MED ORDER — LIDOCAINE HCL 1 % IJ SOLN: 10.0000 mL | Freq: Once | INTRAMUSCULAR | Status: DC

## 2023-10-26 ENCOUNTER — Telehealth: Payer: Self-pay | Admitting: Cardiology

## 2023-10-26 NOTE — Telephone Encounter (Signed)
 Returned call. Spoke with pt's daughter, she states Patient identification verified by 2 forms.   Called and spoke to patient  States:  -Pt's blood pressure has been low since starting  -Worsen fatigue "only about to walk 5 steps then has to sit down" -Has been going on a month -He says it's his breathing but it's really his blood pressure  Denies:  -Having blood pressure readings.       Interventions/Plan: -Appt with pulmonary tomorrow.  -She wants pt to be seen soon.   Reviewed ED warning signs/precautions  Daughter agrees with plan, no questions at this time

## 2023-10-26 NOTE — Telephone Encounter (Signed)
 Pt c/o BP issue: STAT if pt c/o blurred vision, one-sided weakness or slurred speech.  STAT if BP is GREATER than 180/120 TODAY.  STAT if BP is LESS than 90/60 and SYMPTOMATIC TODAY  1. What is your BP concern?   Daughter Mollie Anger) stated patient's BP has been dropping  2. Have you taken any BP medication today? Yes  3. What are your last 5 BP readings?  Not available  4. Are you having any other symptoms (ex. Dizziness, headache, blurred vision, passed out)?  Really bad fatigue    Daughter Mollie Anger) is concerned patient's BP has been dropping and he is not able to walk short distances.  Daughter wants a call back to discuss medication change.  Daughter noted patient is also on dialysis.

## 2023-10-27 ENCOUNTER — Ambulatory Visit (INDEPENDENT_AMBULATORY_CARE_PROVIDER_SITE_OTHER): Admitting: Family Medicine

## 2023-10-27 ENCOUNTER — Encounter: Payer: Self-pay | Admitting: Family Medicine

## 2023-10-27 ENCOUNTER — Ambulatory Visit (INDEPENDENT_AMBULATORY_CARE_PROVIDER_SITE_OTHER): Admitting: Primary Care

## 2023-10-27 ENCOUNTER — Encounter: Payer: Self-pay | Admitting: Primary Care

## 2023-10-27 VITALS — BP 110/70 | HR 62 | Temp 97.9°F | Ht 68.0 in | Wt 154.8 lb

## 2023-10-27 VITALS — BP 110/60 | HR 60 | Ht 68.0 in | Wt 155.8 lb

## 2023-10-27 DIAGNOSIS — J4530 Mild persistent asthma, uncomplicated: Secondary | ICD-10-CM

## 2023-10-27 DIAGNOSIS — R9341 Abnormal radiologic findings on diagnostic imaging of renal pelvis, ureter, or bladder: Secondary | ICD-10-CM

## 2023-10-27 DIAGNOSIS — N3289 Other specified disorders of bladder: Secondary | ICD-10-CM

## 2023-10-27 DIAGNOSIS — I951 Orthostatic hypotension: Secondary | ICD-10-CM

## 2023-10-27 DIAGNOSIS — R911 Solitary pulmonary nodule: Secondary | ICD-10-CM | POA: Diagnosis not present

## 2023-10-27 DIAGNOSIS — I4819 Other persistent atrial fibrillation: Secondary | ICD-10-CM

## 2023-10-27 DIAGNOSIS — R0609 Other forms of dyspnea: Secondary | ICD-10-CM

## 2023-10-27 MED ORDER — RAMELTEON 8 MG PO TABS: 8.0000 mg | ORAL_TABLET | Freq: Every day | ORAL | 5 refills | Status: DC

## 2023-10-27 NOTE — Telephone Encounter (Signed)
 Lvm with pt's daughter to go over Michelle's recommendations.

## 2023-10-27 NOTE — Patient Instructions (Addendum)
 Glad things settled back down with drainage- let us  know if that changes  Midodrine  continue 10 mg 3x a day since that's cardiology preference  Glad you are getting back on inhaler regularly- hope that helps  Try rozerem /ramelteon  for sleep- it was stopped at hospitalization for unclear reasons- lets retrial  At most recnet hospitalization they mentioned bladder wall thickening and need for cystoscopy- call [facility] if you do not hear within a week or so- I referre dyou today  Recommended follow up: Return in about 4 months (around 02/26/2024) for followup or sooner if needed.Schedule b4 you leave. -cancel April visit on your way out

## 2023-10-27 NOTE — Progress Notes (Signed)
 Phone [phone] In person visit   Subjective:   [facility] is a 88 y.o. year old very pleasant male patient who presents for/with See problem oriented charting Chief Complaint  Patient presents with   abdominal drain    Pt here for abdominal drain order, current drain not draining.    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     dorzolamide  (TRUSOPT ) 2 % ophthalmic solution Place 1 drop into the right eye 2 (two) times daily.     ELIQUIS  2.5 MG TABS tablet Take 1 tablet (2.5 mg  total) by mouth 2 (two) times daily. 60 tablet 11   folic acid  (FOLVITE ) 1 MG tablet Take 1 tablet (1 mg total) by mouth daily. 30 tablet 0   gabapentin (NEURONTIN) 300 MG capsule Take 300 mg by mouth at bedtime.     latanoprost  (XALATAN ) 0.005 % ophthalmic solution Place 1 drop into the right eye at bedtime.     midodrine  (PROAMATINE ) 10 MG tablet Take 1 tablet (10 mg total) by mouth daily. (Patient taking differently: Take 15 mg by mouth 3 (three) times daily.)     mometasone -formoterol  (DULERA ) 200-5 MCG/ACT AERO Inhale 2 puffs into the lungs 2 (two) times daily. 1 each 1   pantoprazole  (PROTONIX ) 40 MG tablet Take 1 tablet (40 mg total) by mouth 2 (two) times daily. 60 tablet 0   timolol  (TIMOPTIC ) 0.5 % ophthalmic solution Place 1 drop into the right eye 2 (two) times daily.     No current facility-administered medications for this visit.     Objective:  BP 110/70   Pulse 62   Temp 97.9 F (36.6 C)   Ht 5\' 8"  (1.727 m)   Wt 154 lb 12.8 oz (70.2 kg)   SpO2 100%   BMI 23.54 kg/m  Gen: NAD, resting comfortably CV: regular rate Lungs: CTAB no crackles, wheeze, rhonchi Ext: no edema Skin: warm, dry Neuro: wears hearing aids  Assessment and Plan   # Abscess of abdominal cavity/presacral space fluid collection S: Patient with prior prolonged admission from 07/07/2023 to 07/21/2023 due to perirectal abscess and associated sepsis with associated pre-existing fluid collection in the presacral space-interventional radiology had placed a drain during that hospitalization and patient had prolonged antibiotic course-drain later dislodged and he has done reasonably well until about a month ago-had some drainage from rectal pouch- for last month but stopped 3 - 4 days ago.  His daughter called and at that point inquiring about interventional radiology placing another drain-I discussed this with him today he prefers to wait until draining again before discussing with radiology since it has  resolved A/P: Abscess of abdominal cavity/presacral space fluid collection (stable on CT September 06, 2023)-drainage for about a month but stopped 3 to 4 days ago-offered referral to interventional radiology but he declines as noted-only wants to proceed forward if recurrent symptoms   # Abnormal imaging of bladder - Patient with hospital stay for small bowel obstruction in March with incidental bladder irregularity "2. Irregular bladder wall thickening on the left. Moderate right hydroureter distally. Query ureteral obstruction from bladder mass. Cystoscopy is recommended if not performed previously." - They also noted interval removal of prior presacral drain in similar size of presacral fluid collection -With abnormal imaging of the bladder will refer to urology today  # Asthma S: Medication: Patient reports he had not been taking his Dulera  consistently-he saw pulmonology today and they strongly encouraged consistent use.  He reports having some shortness of breath and he is hopeful this will be helpful A/P: Asthma not well-controlled per pulmonology-encouraged consistency as well with medicine and keep follow-up with him in 8 weeks as planned.  # Orthostatic hypotension - Patient has been on midodrine  15 mg 3 times a day started by Dr. Ardell Beauvais previously but cardiology recently has preferred he be on max dose 10 mg 3 times a day-patient is going to try to reduce to this - No reported orthostatic symptoms today-works around timing of dialysis and he is going to try to take a dose an hour before he finishes dialysis   # GERD-he reports well-controlled-he denies needing pantoprazole  recently-we are going to remove from his medication list  # Atrial fibrillation- prior Dr. Ardell Beauvais S: Rate controlled without medicine Anticoagulated with eliquis  2.5 mg BID A/P: Appropriately anticoagulated.  Continue current medication.  Rate controlled without medicine.  Patient asks about a cheaper option for  Eliquis  and we discussed Coumadin but after discussion he prefers to remain steady on current dose  Insomnia S: Options are limited due to dialysis.  Previously prescribed ramelteon  around 8 mg-he is not sure of results with this A/P: Discussed with patient today and he is willing to retrial ramelteon -this was resent in today  Recommended follow up: Return in about 4 months (around 02/26/2024) for followup or sooner if needed.Schedule b4 you leave. Future Appointments  Date Time Provider Department Center  01/21/2024 11:00 AM [facility] 1 [facility]  02/29/2024  2:00 PM Hudnell, Trevor Fudge, NP [facility]  03/01/2024  8:30 AM Maire Scot, MD [facility] None    Lab/Order associations:   ICD-10-CM   1. Bladder wall thickening  N32.89 Ambulatory referral to Urology    2. Abnormal CT scan, bladder  R93.41 Ambulatory referral to Urology    3. Atrial fibrillation, persistent (HCC)  I48.19     4. Orthostatic hypotension  I95.1     5. Mild persistent asthma, unspecified whether complicated  J45.30  Meds ordered this encounter  Medications   ramelteon  (ROZEREM ) 8 MG tablet    Sig: Take 1 tablet (8 mg total) by mouth at bedtime.    Dispense:  30 tablet    Refill:  5    Return precautions advised.  Clarisa Crooked, MD

## 2023-10-27 NOTE — Progress Notes (Signed)
 @Patient  ID: [place], male    DOB: [birth year], 88 y.o.   MRN: [id number]  Chief Complaint  Patient presents with   Follow-up    DOE F/U    Referring provider: Almira Jaeger, MD  HPI: 88 year old male, former smoker quit in 1985.  Past medical history significant for A-fib, chronic diastolic heart failure, orthostatic hypotension, ulcerative colitis, idiopathic neuropathy, end-stage renal disease on hemodialysis, macrocytic anemia.  Previous [facility] pulmonary encounter: [place] 88 y.o. -presents with his daughter [place].  She is an independent historian here today.  She works at the [employer] on [employer].  She has lived in [place] for 30 years.  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 area.  And then 2 years ago his significant other Shelagh Derrick passed away and then he relocated to [place] to live next to his daughter.  He has a history of colostomy since 2012.  He has a history of nephrostomy tubes somewhere along the way.  He has been on dialysis for the last 2 years.  He makes some amount of urine.  He tells me that he had COVID 2 or 3 years ago and was hospitalized in [facility] was on oxygen.  And then he got better but now for the last 4 months he has had shortness of breath on exertion relieved by rest.  He has to walk slowly at [facility].  When he takes a shower he gets winded but not for changing clothes.  Sometimes getting out of the car makes him short of breath.  He does not think he can do 1 flight of stairs.  There is no chest pain no cough or wheezing.  Review of the labs indicate anemia  Echocardiogram #2023 grade 1 diastolic dysfunction  CT abdomen lung image May 2023: Left lower lobe atelectasis personally visualized.  And agree with final report  Chest x-ray 04/10/2023: Personally visualized official  report is pending.  Looks clear to me.  Walking desaturation test 04/14/2023; 200 feet in office -> did not desaturate  Shortness of breath can be multifactorial and this could be associated with loss of physical conditioning, anemia, stiff heart muscle [you already had it a year ago but at a mild level] and possible post COVID or COVID unrelated pulmonary fibrosis or even smoking-related emphysema  Plan - Do high-resolution CT chest supine and prone - Do full pulmonary function test -Hold off on echocardiogram [most recent 07 May 2022]  Follow-up - Return to see nurse practitioner or Dr. Bertrum Brodie in the next 4-6 weeks [video visit is fine to discuss test results]  06/09/2023 Patient presents today review recent HRCT and pulmonary function results.  Discussed the use of AI scribe software for clinical note transcription with the patient, who gave verbal consent to proceed.  History of Present Illness   The patient, with a history of COVID-19 infection, kidney failure, and dialysis, presents with worsening shortness of breath. The dyspnea, initially intermittent, has become constant and is exacerbated by any exertion, including activities of daily living. The patient denies associated symptoms such as cough, wheezing, or chest tightness.  The patient was previously hospitalized for COVID-19 in [facility] and required oxygen therapy. A recent walk test indicated no need for supplemental oxygen. A CT scan showed mild changes in the lungs, including  parenchymal changes and scarring, likely due to the previous COVID-19 infection. The patient's lung function was found to be normal on a breathing test, with some evidence of reversibility after bronchodilator administration, suggesting possible reactive airway disease or asthma-like symptoms.  The patient is not currently on any inhalers. He also reports sleep disturbances, but denies experiencing breathing problems during sleep. The  patient's activity level has decreased due to the dyspnea, and he is unable to engage in exercise regimens such as treadmill use or leg cycling.  The patient's cardiac health appears stable, with a cardiac catheterization in January showing no significant plaque buildup and normal heart function. An echocardiogram conducted a year ago also showed normal results.    10/27/2023- Interim hx  Discussed the use of AI scribe software for clinical note transcription with the patient, who gave verbal consent to proceed.  History of Present Illness   [place] is an 88 year old male with reactive airway disease who presents with worsening shortness of breath.  He experiences worsening shortness of breath, particularly with exertion, such as when getting up quickly or walking short distances, often necessitating rest to catch his breath. He uses Dulera  inhaler, prescribed in March, taking two puffs in the morning and two in the evening, though inconsistently due to perceived ineffectiveness. No current cough, chest tightness, or chest pain. No swelling in his legs.  He has a history of mild diastolic dysfunction diagnosed via echocardiogram in November 2023. A heart catheterization in January 2024 showed no significant coronary artery disease and normal heart function.  A breathing test in December 2024 indicated mild restriction with significant improvement post-bronchodilator, suggestive of reactive airway disease/ asthma. A CT scan in February 2025 showed no interstitial lung disease but revealed a stable 7mm right lower lobe nodule and an ascending aortic aneurysm, both of which are being monitored.  He is on hemodialysis three times a week. His creatinine levels have varied between 6.9 and 9.5, with the most recent being 6.9.      Allergies  Allergen Reactions   Cephalosporins Rash   Ciprofloxacin Itching and Rash   Baclofen Other (See Comments)    Altered mental status, after accidental  overdose      Immunization History  Administered Date(s) Administered   Influenza, High Dose Seasonal PF 04/20/2018   Influenza, Seasonal, Injecte, Preservative Fre 05/16/2014   Influenza-Unspecified 03/17/2012, 04/15/2013, 05/16/2014, 05/09/2015, 05/07/2016, 03/24/2017, 05/31/2017, 04/10/2019   PFIZER(Purple Top)SARS-COV-2 Vaccination 07/16/2019, 08/09/2019   Pneumococcal Conjugate-13 01/09/2016, 04/06/2016   Pneumococcal Polysaccharide-23 03/17/2012, 11/10/2017   Zoster, Live 04/18/2013    Past Medical History:  Diagnosis Date   A-fib (HCC)    Anemia    Arthritis    Cancer (HCC)    Basal cell   COVID-19    2021   Dysrhythmia    Afib-controlled on eliquis    ESRD (end stage renal disease) (HCC) 10/22/2021   Glaucoma 11/18/2021   History of DVT (deep vein thrombosis)    Hydronephrosis    managed wtih a PCN   Idiopathic neuropathy 10/22/2021   lyrica     Ileostomy in place [facility])    Obstructive uropathy    With chronic left nephrostomy   Old retinal detachment, total or subtotal    Orthostatic hypotension 10/22/2021   Sleep apnea    does not need a machine   Stroke (HCC)    Ulcerative colitis (HCC)    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     ELIQUIS  2.5 MG TABS tablet Take 1 tablet (2.5 mg total) by mouth 2 (two) times daily. 60 tablet 11   folic acid  (FOLVITE ) 1 MG tablet Take 1 tablet (1 mg total) by mouth daily. 30 tablet 0   gabapentin  (NEURONTIN) 300 MG capsule Take 300 mg by mouth at bedtime.     latanoprost  (XALATAN ) 0.005 % ophthalmic solution Place 1 drop into the right eye at bedtime.     midodrine  (PROAMATINE ) 10 MG tablet Take 1 tablet (10 mg total) by mouth daily. (Patient taking differently: Take 15 mg by mouth 3 (three) times daily.)     mometasone -formoterol  (DULERA ) 200-5 MCG/ACT AERO Inhale 2 puffs into the lungs 2 (two) times daily. 1 each 1   timolol  (TIMOPTIC ) 0.5 % ophthalmic solution Place 1 drop into the right eye 2 (two) times daily.     dorzolamide  (TRUSOPT ) 2 % ophthalmic solution Place 1 drop into the right eye 2 (two) times daily. (Patient not taking: Reported on 09/07/2023)     pantoprazole  (PROTONIX ) 40 MG tablet Take 1 tablet (40 mg total) by mouth 2 (two) times daily. (Patient not taking: Reported on 09/07/2023) 60 tablet 0   No facility-administered medications prior to visit.   Review of Systems  Review of Systems  Constitutional: Negative.   HENT: Negative.    Respiratory:  Positive for shortness of breath. Negative for cough, chest tightness and wheezing.   Cardiovascular: Negative.    Physical Exam  BP 110/60   Pulse 60   Ht 5\' 8"  (1.727 m)   Wt 155 lb 12.8 oz (70.7 kg)   SpO2 95%   BMI 23.69 kg/m  Physical Exam Constitutional:      General: He is not in acute distress.    Appearance: Normal appearance. He is not ill-appearing.  HENT:     Head: Normocephalic and atraumatic.  Cardiovascular:     Rate and Rhythm: Normal rate and regular rhythm.  Pulmonary:     Effort: Pulmonary effort is normal.     Breath sounds: Normal breath sounds. No wheezing or rhonchi.     Comments: CTA Musculoskeletal:        General: Normal range of motion.  Skin:    General: Skin is warm and dry.  Neurological:     General: No focal deficit present.     Mental Status: He is alert and oriented to person, place, and time. Mental status is at baseline.  Psychiatric:        Mood and Affect: Mood  normal.        Behavior: Behavior normal.        Thought Content: Thought content normal.        Judgment: Judgment normal.      Lab Results:  CBC    Component Value Date/Time   WBC 9.7 09/08/2023 0804   RBC 2.81 (L) 09/08/2023 0804   HGB 10.6 (L) 09/08/2023 0804   HGB 10.9 (L) 04/10/2023 1541  HCT 32.3 (L) 09/08/2023 0804   HCT 33.2 (L) 04/10/2023 1541   PLT 222 09/08/2023 0804   PLT 207 04/10/2023 1541   MCV 114.9 (H) 09/08/2023 0804   MCV 119 (H) 04/10/2023 1541   MCH 37.7 (H) 09/08/2023 0804   MCHC 32.8 09/08/2023 0804   RDW 17.9 (H) 09/08/2023 0804   RDW 13.2 04/10/2023 1541   LYMPHSABS 0.6 (L) 09/07/2023 0609   LYMPHSABS 0.8 04/10/2023 1541   MONOABS 0.6 09/07/2023 0609   EOSABS 0.1 09/07/2023 0609   EOSABS 0.3 04/10/2023 1541   BASOSABS 0.0 09/07/2023 0609   BASOSABS 0.1 04/10/2023 1541    BMET    Component Value Date/Time   NA 136 09/08/2023 1948   NA 138 04/10/2023 1541   K 3.5 09/08/2023 1948   CL 92 (L) 09/08/2023 1948   CO2 23 09/08/2023 1948   GLUCOSE 84 09/08/2023 1948   BUN 49 (H) 09/08/2023 1948   BUN 19 04/10/2023 1541   CREATININE 6.90 (H) 09/08/2023 1948   CALCIUM  8.3 (L) 09/08/2023 1948   GFRNONAA 7 (L) 09/08/2023 1948    BNP No results found for: "BNP"  ProBNP No results found for: "PROBNP"  Imaging: IR NEPHROSTOMY EXCHANGE LEFT Result Date: 10/22/2023 INDICATION: Left percutaneous nephrostomy tube requiring routine exchange. EXAM: LEFT PERCUTANEOUS NEPHROSTOMY TUBE EXCHANGE UNDER FLUOROSCOPY COMPARISON:  None Available. MEDICATIONS: None ANESTHESIA/SEDATION: None CONTRAST:  10 mL Omnipaque  300-administered into the collecting system(s) FLUOROSCOPY: Radiation Exposure Index (as provided by the fluoroscopic device): 16 mGy Kerma COMPLICATIONS: None immediate. PROCEDURE: Informed written consent was obtained from the patient after a thorough discussion of the procedural risks, benefits and alternatives. All questions were addressed.  Maximal Sterile Barrier Technique was utilized including caps, mask, sterile gowns, sterile gloves, sterile drape, hand hygiene and skin antiseptic. A timeout was performed prior to the initiation of the procedure. A pre-existing 10 French percutaneous nephrostomy tube was injected with contrast material under fluoroscopy. The catheter was cut and removed over a guidewire. A new 10 French nephrostomy tube was advanced over the guidewire and formed at the level of the renal pelvis. The catheter was flushed with saline and attached to a new gravity drainage bag. IMPRESSION: Exchange of 10 French left percutaneous nephrostomy tube under fluoroscopy. Electronically Signed   By: Erica Hau M.D.   On: 10/22/2023 12:53     Assessment & Plan:   1. Pulmonary nodule (Primary) - CT Chest Wo Contrast; Future  Assessment and Plan    Asthma Asthma with exertional dyspnea. Pulmonary function test showed mild restriction with significant improvement post-bronchodilator. Current inhaler therapy with Dulera  ineffective due to inconsistent use. No wheezing, crackles, cough, or chest tightness. Shortness of breath primarily on exertion. Patient had a normal heart catheterization in January 2024. - Advised patient resume using Dulera  inhaler 200-50mcg (mometasone /formoterol ) two puffs in the morning and two puffs in the evening consistently. - Rinse mouth after using the inhaler to prevent thrush. - Use nebulizer only for emergencies with severe shortness of breath or chest tightness at rest. - Follow up in 6-8 weeks to assess inhaler therapy effectiveness. - Evaluate oxygen saturation during exertion and consider nocturnal oxygen monitoring if needed.   Right lower lobe lung nodule 7 mm right lower lobe lung nodule identified on CT scan in February 2025, with no change in size. No definitive interstitial lung disease or pulmonary fibrosis. Monitoring for stability over time. - Ordered follow-up CT scan in  12-18 months to monitor lung nodule stability.  End-stage  renal disease on dialysis End-stage renal disease managed with hemodialysis three times a week. Recent creatinine level of 6.9, improved from 9.58. Hgb 10.6. Renal failure and anemia likely contributing to shortness of breath. - Continue hemodialysis three times a week.  Mild diastolic dysfunction Mild diastolic dysfunction identified on echocardiogram in November 2023. No significant coronary artery disease on heart catheterization in January 2024. Normal heart function with ejection fraction of 55-65%. - Continue monitoring with cardiologist.  Ascending aortic aneurysm Ascending aortic aneurysm less than 5 cm in size. No intervention required. Regular monitoring to ensure stability. - Continue monitoring with cardiologist.      Antonio Baumgarten, NP 10/27/2023

## 2023-10-27 NOTE — Telephone Encounter (Signed)
 S/w pt's daughter and was given Florette Hurry NP recommendations and ED precautions.

## 2023-10-27 NOTE — Telephone Encounter (Signed)
 Patient has been taking midodrine  10 mg 3 times daily which is the maximum dose. Does he continue to take this daily or only on dialysis days? If not already doing so, he can take midodrine  10 mg every 8 hours if he is having significant drops in blood pressure. He should also ensure good hydration but not exceeding fluid recommendations from nephrology and regular intake of foods high in protein.

## 2023-10-27 NOTE — Patient Instructions (Addendum)
 -  ASTHMA: Asthma is a condition where your airways narrow and swell, causing difficulty in breathing. You should use your Dulera  inhaler (two puffs in the morning and two in the evening) consistently and rinse your mouth after each use to prevent thrush. Use the nebulizer only for emergencies. We will follow up in 6-8 weeks to assess the effectiveness of your inhaler therapy and may evaluate your oxygen levels during exertion.  -RIGHT LOWER LOBE LUNG NODULE: A lung nodule is a small growth in the lung. Your 7 mm nodule has not changed in size, so we will monitor it with a follow-up CT scan in 12-18 months to ensure it remains stable.  -END-STAGE RENAL DISEASE ON DIALYSIS: End-stage renal disease is the final stage of chronic kidney disease where the kidneys no longer function properly. You will continue with hemodialysis three times a week to manage this condition.  -MILD DIASTOLIC DYSFUNCTION: Mild diastolic dysfunction means your heart has difficulty relaxing and filling with blood. Your heart function is normal, and we will continue to monitor this with your cardiologist.  -ASCENDING AORTIC ANEURYSM: An ascending aortic aneurysm is an enlargement of the upper part of the aorta. Your aneurysm is less than 5 cm in size and does not require intervention at this time. We will continue to monitor it with your cardiologist.  INSTRUCTIONS: Please follow up in 6-8 weeks to assess the effectiveness of your inhaler therapy. Continue with your hemodialysis sessions three times a week. We will also schedule a follow-up CT scan in 12-18 months to monitor your lung nodule. Continue regular monitoring with your cardiologist for your mild diastolic dysfunction and ascending aortic aneurysm.  Follow-up 6-8 weeks with Dr. Bertrum Brodie (15 min)- Beth np if not available

## 2023-10-27 NOTE — Telephone Encounter (Signed)
 Daughter(Rene) returned staff call.

## 2023-11-01 ENCOUNTER — Encounter: Payer: Self-pay | Admitting: Family Medicine

## 2023-11-01 DIAGNOSIS — K651 Peritoneal abscess: Secondary | ICD-10-CM

## 2023-11-08 ENCOUNTER — Other Ambulatory Visit: Payer: Self-pay

## 2023-11-08 ENCOUNTER — Inpatient Hospital Stay (HOSPITAL_BASED_OUTPATIENT_CLINIC_OR_DEPARTMENT_OTHER)
Admission: EM | Admit: 2023-11-08 | Discharge: 2023-11-13 | DRG: 314 | Disposition: A | Discharge status: Home / Self Care | Attending: Internal Medicine | Admitting: Internal Medicine

## 2023-11-08 ENCOUNTER — Encounter (HOSPITAL_BASED_OUTPATIENT_CLINIC_OR_DEPARTMENT_OTHER): Payer: Self-pay

## 2023-11-08 ENCOUNTER — Emergency Department (HOSPITAL_BASED_OUTPATIENT_CLINIC_OR_DEPARTMENT_OTHER): Admitting: Radiology

## 2023-11-08 DIAGNOSIS — N186 End stage renal disease: Secondary | ICD-10-CM

## 2023-11-08 DIAGNOSIS — R7989 Other specified abnormal findings of blood chemistry: Secondary | ICD-10-CM | POA: Diagnosis not present

## 2023-11-08 DIAGNOSIS — Z862 Personal history of diseases of the blood and blood-forming organs and certain disorders involving the immune mechanism: Secondary | ICD-10-CM

## 2023-11-08 DIAGNOSIS — Z932 Ileostomy status: Secondary | ICD-10-CM

## 2023-11-08 DIAGNOSIS — R0609 Other forms of dyspnea: Secondary | ICD-10-CM | POA: Diagnosis present

## 2023-11-08 DIAGNOSIS — Z881 Allergy status to other antibiotic agents status: Secondary | ICD-10-CM

## 2023-11-08 DIAGNOSIS — K651 Peritoneal abscess: Secondary | ICD-10-CM

## 2023-11-08 DIAGNOSIS — I358 Other nonrheumatic aortic valve disorders: Secondary | ICD-10-CM | POA: Diagnosis present

## 2023-11-08 DIAGNOSIS — I4819 Other persistent atrial fibrillation: Secondary | ICD-10-CM | POA: Diagnosis present

## 2023-11-08 DIAGNOSIS — R079 Chest pain, unspecified: Secondary | ICD-10-CM | POA: Diagnosis present

## 2023-11-08 DIAGNOSIS — Z87891 Personal history of nicotine dependence: Secondary | ICD-10-CM

## 2023-11-08 DIAGNOSIS — I4891 Unspecified atrial fibrillation: Secondary | ICD-10-CM | POA: Diagnosis present

## 2023-11-08 DIAGNOSIS — Z85828 Personal history of other malignant neoplasm of skin: Secondary | ICD-10-CM

## 2023-11-08 DIAGNOSIS — Z933 Colostomy status: Secondary | ICD-10-CM

## 2023-11-08 DIAGNOSIS — K8020 Calculus of gallbladder without cholecystitis without obstruction: Secondary | ICD-10-CM | POA: Diagnosis present

## 2023-11-08 DIAGNOSIS — D631 Anemia in chronic kidney disease: Secondary | ICD-10-CM | POA: Diagnosis present

## 2023-11-08 DIAGNOSIS — I4510 Unspecified right bundle-branch block: Secondary | ICD-10-CM | POA: Diagnosis present

## 2023-11-08 DIAGNOSIS — I214 Non-ST elevation (NSTEMI) myocardial infarction: Principal | ICD-10-CM

## 2023-11-08 DIAGNOSIS — I480 Paroxysmal atrial fibrillation: Secondary | ICD-10-CM | POA: Diagnosis present

## 2023-11-08 DIAGNOSIS — N2581 Secondary hyperparathyroidism of renal origin: Secondary | ICD-10-CM | POA: Diagnosis present

## 2023-11-08 DIAGNOSIS — Z8616 Personal history of COVID-19: Secondary | ICD-10-CM

## 2023-11-08 DIAGNOSIS — K219 Gastro-esophageal reflux disease without esophagitis: Secondary | ICD-10-CM | POA: Diagnosis present

## 2023-11-08 DIAGNOSIS — H409 Unspecified glaucoma: Secondary | ICD-10-CM | POA: Diagnosis present

## 2023-11-08 DIAGNOSIS — I120 Hypertensive chronic kidney disease with stage 5 chronic kidney disease or end stage renal disease: Secondary | ICD-10-CM | POA: Diagnosis present

## 2023-11-08 DIAGNOSIS — N189 Chronic kidney disease, unspecified: Secondary | ICD-10-CM

## 2023-11-08 DIAGNOSIS — G4733 Obstructive sleep apnea (adult) (pediatric): Secondary | ICD-10-CM | POA: Diagnosis present

## 2023-11-08 DIAGNOSIS — Z7951 Long term (current) use of inhaled steroids: Secondary | ICD-10-CM

## 2023-11-08 DIAGNOSIS — I309 Acute pericarditis, unspecified: Secondary | ICD-10-CM | POA: Diagnosis not present

## 2023-11-08 DIAGNOSIS — Z8673 Personal history of transient ischemic attack (TIA), and cerebral infarction without residual deficits: Secondary | ICD-10-CM

## 2023-11-08 DIAGNOSIS — Z936 Other artificial openings of urinary tract status: Secondary | ICD-10-CM

## 2023-11-08 DIAGNOSIS — Z923 Personal history of irradiation: Secondary | ICD-10-CM

## 2023-11-08 DIAGNOSIS — Z79899 Other long term (current) drug therapy: Secondary | ICD-10-CM

## 2023-11-08 DIAGNOSIS — K760 Fatty (change of) liver, not elsewhere classified: Secondary | ICD-10-CM | POA: Diagnosis present

## 2023-11-08 DIAGNOSIS — A419 Sepsis, unspecified organism: Secondary | ICD-10-CM

## 2023-11-08 DIAGNOSIS — Z823 Family history of stroke: Secondary | ICD-10-CM

## 2023-11-08 DIAGNOSIS — Z7982 Long term (current) use of aspirin: Secondary | ICD-10-CM

## 2023-11-08 DIAGNOSIS — M4628 Osteomyelitis of vertebra, sacral and sacrococcygeal region: Secondary | ICD-10-CM | POA: Insufficient documentation

## 2023-11-08 DIAGNOSIS — R54 Age-related physical debility: Secondary | ICD-10-CM | POA: Diagnosis present

## 2023-11-08 DIAGNOSIS — I951 Orthostatic hypotension: Secondary | ICD-10-CM | POA: Diagnosis present

## 2023-11-08 DIAGNOSIS — Z992 Dependence on renal dialysis: Secondary | ICD-10-CM

## 2023-11-08 DIAGNOSIS — G609 Hereditary and idiopathic neuropathy, unspecified: Secondary | ICD-10-CM | POA: Diagnosis present

## 2023-11-08 DIAGNOSIS — Z7901 Long term (current) use of anticoagulants: Secondary | ICD-10-CM

## 2023-11-08 DIAGNOSIS — Z888 Allergy status to other drugs, medicaments and biological substances status: Secondary | ICD-10-CM

## 2023-11-08 DIAGNOSIS — K5190 Ulcerative colitis, unspecified, without complications: Secondary | ICD-10-CM | POA: Diagnosis present

## 2023-11-08 DIAGNOSIS — I252 Old myocardial infarction: Secondary | ICD-10-CM

## 2023-11-08 DIAGNOSIS — I441 Atrioventricular block, second degree: Secondary | ICD-10-CM | POA: Diagnosis present

## 2023-11-08 DIAGNOSIS — D539 Nutritional anemia, unspecified: Secondary | ICD-10-CM | POA: Diagnosis present

## 2023-11-08 DIAGNOSIS — I2510 Atherosclerotic heart disease of native coronary artery without angina pectoris: Secondary | ICD-10-CM | POA: Diagnosis present

## 2023-11-08 DIAGNOSIS — Z86718 Personal history of other venous thrombosis and embolism: Secondary | ICD-10-CM

## 2023-11-08 LAB — CBC
HCT: 29 % — ABNORMAL LOW (ref 39.0–52.0)
Hemoglobin: 9.8 g/dL — ABNORMAL LOW (ref 13.0–17.0)
MCH: 39.4 pg — ABNORMAL HIGH (ref 26.0–34.0)
MCHC: 33.8 g/dL (ref 30.0–36.0)
MCV: 116.5 fL — ABNORMAL HIGH (ref 80.0–100.0)
Platelets: 197 10*3/uL (ref 150–400)
RBC: 2.49 MIL/uL — ABNORMAL LOW (ref 4.22–5.81)
RDW: 15.5 % (ref 11.5–15.5)
WBC: 15.8 10*3/uL — ABNORMAL HIGH (ref 4.0–10.5)
nRBC: 0 % (ref 0.0–0.2)

## 2023-11-08 LAB — BASIC METABOLIC PANEL WITH GFR
Anion gap: 20 — ABNORMAL HIGH (ref 5–15)
BUN: 67 mg/dL — ABNORMAL HIGH (ref 8–23)
CO2: 25 mmol/L (ref 22–32)
Calcium: 10.2 mg/dL (ref 8.9–10.3)
Chloride: 92 mmol/L — ABNORMAL LOW (ref 98–111)
Creatinine, Ser: 10.1 mg/dL — ABNORMAL HIGH (ref 0.61–1.24)
GFR, Estimated: 5 mL/min — ABNORMAL LOW (ref >60)
Glucose, Bld: 167 mg/dL — ABNORMAL HIGH (ref 70–99)
Potassium: 3.7 mmol/L (ref 3.5–5.1)
Sodium: 136 mmol/L (ref 135–145)

## 2023-11-08 LAB — TROPONIN T, HIGH SENSITIVITY: Troponin T High Sensitivity: 428 ng/L (ref <19)

## 2023-11-08 MED ORDER — NITROGLYCERIN 0.4 MG SL SUBL: 0.4000 mg | SUBLINGUAL_TABLET | SUBLINGUAL | Status: DC | PRN

## 2023-11-08 MED ORDER — FENTANYL CITRATE PF 50 MCG/ML IJ SOSY
50.0000 ug | PREFILLED_SYRINGE | Freq: Once | INTRAMUSCULAR | Status: AC
  Administered 2023-11-09: 50 ug via INTRAVENOUS
  Filled 2023-11-08: qty 1

## 2023-11-08 MED ORDER — ASPIRIN 325 MG PO TABS
325.0000 mg | ORAL_TABLET | Freq: Every day | ORAL | Status: DC
  Administered 2023-11-09: 325 mg via ORAL
  Filled 2023-11-08: qty 1

## 2023-11-08 NOTE — ED Notes (Signed)
 Dr. Adrain Alar aware of 428 troponin.

## 2023-11-08 NOTE — ED Provider Notes (Signed)
 [place] EMERGENCY DEPARTMENT AT [facility] Provider Note   CSN: [id number] Arrival date & time: 11/08/23  2233     History  Chief Complaint  Patient presents with   Chest Pain    Kohlten Manieri is a 88 y.o. male.   Chest Pain    88 year old male with medical history significant for ESRD on HD MWF, ulcerative colitis, DVT, on Eliquis , OSA, atrial fibrillation, CVA presenting to the emergency department with chest pain.  The patient states that he had acute onset of chest pain 1 hour prior to arrival.  He describes it as a chest heaviness located bilaterally across his chest.  He endorses associated shortness of breath.  Chest pain is not ripping or tearing, does not radiate to the back but is migratory across his chest.  His last dialysis session was on Friday.  He denies any fevers, chills or cough.  He denies any lower extremity swelling. Pain is moderate in severity.   Home Medications Prior to Admission medications   Medication Sig Start Date End Date Taking? Authorizing Provider  Cyanocobalamin (VITAMIN B-12 PO) Take 1 capsule by mouth in the morning and at bedtime.    [provider]  dorzolamide  (TRUSOPT ) 2 % ophthalmic solution Place 1 drop into the right eye 2 (two) times daily. 06/17/23   [provider]  ELIQUIS  2.5 MG TABS tablet Take 1 tablet (2.5 mg total) by mouth 2 (two) times daily. 08/07/22   Sonny Dust, MD  folic acid  (FOLVITE ) 1 MG tablet Take 1 tablet (1 mg total) by mouth daily. 07/21/23   Regalado, Belkys A, MD  gabapentin (NEURONTIN) 300 MG capsule Take 300 mg by mouth at bedtime. 09/09/23   [provider]  latanoprost  (XALATAN ) 0.005 % ophthalmic solution Place 1 drop into the right eye at bedtime.    [provider]  midodrine  (PROAMATINE ) 10 MG tablet Take 1 tablet (10 mg total) by mouth daily. Patient taking differently: Take 15 mg by mouth 3 (three) times daily. 09/24/23   Swinyer, Leilani Punter, NP   mometasone -formoterol  (DULERA ) 200-5 MCG/ACT AERO Inhale 2 puffs into the lungs 2 (two) times daily. 09/08/23   Doroteo Gasmen, MD  pantoprazole  (PROTONIX ) 40 MG tablet Take 1 tablet (40 mg total) by mouth 2 (two) times daily. 07/21/23   Regalado, Belkys A, MD  ramelteon  (ROZEREM ) 8 MG tablet Take 1 tablet (8 mg total) by mouth at bedtime. 10/27/23   Almira Jaeger, MD  timolol  (TIMOPTIC ) 0.5 % ophthalmic solution Place 1 drop into the right eye 2 (two) times daily.    [provider]      Allergies    Cephalosporins, Ciprofloxacin, and Baclofen    Review of Systems   Review of Systems  Cardiovascular:  Positive for chest pain.    Physical Exam Updated Vital Signs BP 112/67   Pulse 89   Temp 98.9 F (37.2 C) (Oral)   Resp 20   SpO2 99%  Physical Exam  ED Results / Procedures / Treatments   Labs (all labs ordered are listed, but only abnormal results are displayed) Labs Reviewed  BASIC METABOLIC PANEL WITH GFR - Abnormal; Notable for the following components:      Result Value   Chloride 92 (*)    Glucose, Bld 167 (*)    BUN 67 (*)    Creatinine, Ser 10.10 (*)    GFR, Estimated 5 (*)    Anion gap 20 (*)    All  other components within normal limits  CBC - Abnormal; Notable for the following components:   WBC 15.8 (*)    RBC 2.49 (*)    Hemoglobin 9.8 (*)    HCT 29.0 (*)    MCV 116.5 (*)    MCH 39.4 (*)    All other components within normal limits  TROPONIN T, HIGH SENSITIVITY - Abnormal; Notable for the following components:   Troponin T High Sensitivity 428 (*)    All other components within normal limits    EKG None  Radiology DG Chest 2 View Result Date: 11/08/2023 CLINICAL DATA:  Shortness of breath and chest pain EXAM: CHEST - 2 VIEW COMPARISON:  Radiograph 07/07/2023 FINDINGS: Stable cardiomediastinal silhouette. Calcified tortuous aorta. Low lung volumes accentuate pulmonary vascularity. Chronic bronchitic changes. Left basilar atelectasis. No  pleural effusion or pneumothorax. Right subclavian vascular stent. IMPRESSION: Chronic bronchitic changes.  Left basilar atelectasis/scarring. Electronically Signed   By: Rozell Cornet M.D.   On: 11/08/2023 23:06    Procedures Procedures  {Document cardiac monitor, telemetry assessment procedure when appropriate:1}  Medications Ordered in ED Medications - No data to display  ED Course/ Medical Decision Making/ A&P Clinical Course as of 11/08/23 2331  Sun Nov 08, 2023  2330 Troponin T High Sensitivity(!!): 428 [JL]    Clinical Course User Index [JL] Rosealee Concha, MD   {   Click here for ABCD2, HEART and other calculatorsREFRESH Note before signing :1}                              Medical Decision Making Amount and/or Complexity of Data Reviewed Labs: ordered. Decision-making details documented in ED Course. Radiology: ordered.  Risk OTC drugs. Prescription drug management.   ***  {Document critical care time when appropriate:1} {Document review of labs and clinical decision tools ie heart score, Chads2Vasc2 etc:1}  {Document your independent review of radiology images, and any outside records:1} {Document your discussion with family members, caretakers, and with consultants:1} {Document social determinants of health affecting pt's care:1} {Document your decision making why or why not admission, treatments were needed:1} Final Clinical Impression(s) / ED Diagnoses Final diagnoses:  None    Rx / DC Orders ED Discharge Orders     None

## 2023-11-08 NOTE — ED Triage Notes (Signed)
 Pt reports CP onset approx 1hr ago. States "I always have SHOB, but it's been bad. Hx afib, ESRD. Compliant w all home meds.   Dialysis MWF, last session Friday. States "they never pull off more than 1.5L"

## 2023-11-09 ENCOUNTER — Inpatient Hospital Stay (HOSPITAL_COMMUNITY)

## 2023-11-09 ENCOUNTER — Emergency Department (HOSPITAL_BASED_OUTPATIENT_CLINIC_OR_DEPARTMENT_OTHER)

## 2023-11-09 DIAGNOSIS — G4733 Obstructive sleep apnea (adult) (pediatric): Secondary | ICD-10-CM | POA: Diagnosis present

## 2023-11-09 DIAGNOSIS — N186 End stage renal disease: Secondary | ICD-10-CM | POA: Diagnosis not present

## 2023-11-09 DIAGNOSIS — K8020 Calculus of gallbladder without cholecystitis without obstruction: Secondary | ICD-10-CM | POA: Diagnosis present

## 2023-11-09 DIAGNOSIS — Z932 Ileostomy status: Secondary | ICD-10-CM | POA: Diagnosis not present

## 2023-11-09 DIAGNOSIS — I441 Atrioventricular block, second degree: Secondary | ICD-10-CM | POA: Diagnosis present

## 2023-11-09 DIAGNOSIS — Z992 Dependence on renal dialysis: Secondary | ICD-10-CM | POA: Diagnosis not present

## 2023-11-09 DIAGNOSIS — R7989 Other specified abnormal findings of blood chemistry: Secondary | ICD-10-CM | POA: Diagnosis present

## 2023-11-09 DIAGNOSIS — I2510 Atherosclerotic heart disease of native coronary artery without angina pectoris: Secondary | ICD-10-CM | POA: Diagnosis present

## 2023-11-09 DIAGNOSIS — G609 Hereditary and idiopathic neuropathy, unspecified: Secondary | ICD-10-CM | POA: Diagnosis present

## 2023-11-09 DIAGNOSIS — R0609 Other forms of dyspnea: Secondary | ICD-10-CM | POA: Diagnosis not present

## 2023-11-09 DIAGNOSIS — Z933 Colostomy status: Secondary | ICD-10-CM | POA: Diagnosis not present

## 2023-11-09 DIAGNOSIS — I120 Hypertensive chronic kidney disease with stage 5 chronic kidney disease or end stage renal disease: Secondary | ICD-10-CM | POA: Diagnosis present

## 2023-11-09 DIAGNOSIS — R079 Chest pain, unspecified: Secondary | ICD-10-CM | POA: Diagnosis not present

## 2023-11-09 DIAGNOSIS — I4510 Unspecified right bundle-branch block: Secondary | ICD-10-CM | POA: Diagnosis present

## 2023-11-09 DIAGNOSIS — K610 Anal abscess: Secondary | ICD-10-CM | POA: Diagnosis not present

## 2023-11-09 DIAGNOSIS — I959 Hypotension, unspecified: Secondary | ICD-10-CM | POA: Diagnosis not present

## 2023-11-09 DIAGNOSIS — I309 Acute pericarditis, unspecified: Secondary | ICD-10-CM | POA: Diagnosis not present

## 2023-11-09 DIAGNOSIS — I4819 Other persistent atrial fibrillation: Secondary | ICD-10-CM | POA: Diagnosis not present

## 2023-11-09 DIAGNOSIS — N2581 Secondary hyperparathyroidism of renal origin: Secondary | ICD-10-CM | POA: Diagnosis present

## 2023-11-09 DIAGNOSIS — D631 Anemia in chronic kidney disease: Secondary | ICD-10-CM | POA: Diagnosis present

## 2023-11-09 DIAGNOSIS — M8618 Other acute osteomyelitis, other site: Secondary | ICD-10-CM | POA: Diagnosis not present

## 2023-11-09 DIAGNOSIS — R54 Age-related physical debility: Secondary | ICD-10-CM | POA: Diagnosis present

## 2023-11-09 DIAGNOSIS — H409 Unspecified glaucoma: Secondary | ICD-10-CM | POA: Diagnosis present

## 2023-11-09 DIAGNOSIS — D539 Nutritional anemia, unspecified: Secondary | ICD-10-CM | POA: Diagnosis present

## 2023-11-09 DIAGNOSIS — N189 Chronic kidney disease, unspecified: Secondary | ICD-10-CM | POA: Diagnosis not present

## 2023-11-09 DIAGNOSIS — M4628 Osteomyelitis of vertebra, sacral and sacrococcygeal region: Secondary | ICD-10-CM | POA: Diagnosis present

## 2023-11-09 DIAGNOSIS — I951 Orthostatic hypotension: Secondary | ICD-10-CM | POA: Diagnosis present

## 2023-11-09 DIAGNOSIS — K219 Gastro-esophageal reflux disease without esophagitis: Secondary | ICD-10-CM | POA: Diagnosis present

## 2023-11-09 DIAGNOSIS — I358 Other nonrheumatic aortic valve disorders: Secondary | ICD-10-CM | POA: Diagnosis present

## 2023-11-09 DIAGNOSIS — K760 Fatty (change of) liver, not elsewhere classified: Secondary | ICD-10-CM | POA: Diagnosis present

## 2023-11-09 DIAGNOSIS — Z8616 Personal history of COVID-19: Secondary | ICD-10-CM | POA: Diagnosis not present

## 2023-11-09 LAB — TYPE AND SCREEN
ABO/RH(D): B NEG
Antibody Screen: NEGATIVE

## 2023-11-09 LAB — COMPREHENSIVE METABOLIC PANEL WITH GFR
ALT: 32 U/L (ref 0–44)
AST: 16 U/L (ref 15–41)
Albumin: 2.6 g/dL — ABNORMAL LOW (ref 3.5–5.0)
Alkaline Phosphatase: 77 U/L (ref 38–126)
Anion gap: 18 — ABNORMAL HIGH (ref 5–15)
BUN: 78 mg/dL — ABNORMAL HIGH (ref 8–23)
CO2: 20 mmol/L — ABNORMAL LOW (ref 22–32)
Calcium: 8.9 mg/dL (ref 8.9–10.3)
Chloride: 94 mmol/L — ABNORMAL LOW (ref 98–111)
Creatinine, Ser: 11.24 mg/dL — ABNORMAL HIGH (ref 0.61–1.24)
GFR, Estimated: 4 mL/min — ABNORMAL LOW (ref >60)
Glucose, Bld: 137 mg/dL — ABNORMAL HIGH (ref 70–99)
Potassium: 4.4 mmol/L (ref 3.5–5.1)
Sodium: 132 mmol/L — ABNORMAL LOW (ref 135–145)
Total Bilirubin: 1.1 mg/dL (ref 0.0–1.2)
Total Protein: 6.5 g/dL (ref 6.5–8.1)

## 2023-11-09 LAB — MAGNESIUM: Magnesium: 2.2 mg/dL (ref 1.7–2.4)

## 2023-11-09 LAB — HEMOGLOBIN A1C
Hgb A1c MFr Bld: 5.9 % — ABNORMAL HIGH (ref 4.8–5.6)
Mean Plasma Glucose: 122.63 mg/dL

## 2023-11-09 LAB — VITAMIN B12: Vitamin B-12: 5684 pg/mL — ABNORMAL HIGH (ref 180–914)

## 2023-11-09 LAB — APTT: aPTT: 42 s — ABNORMAL HIGH (ref 24–36)

## 2023-11-09 LAB — TROPONIN T, HIGH SENSITIVITY: Troponin T High Sensitivity: 374 ng/L (ref <19)

## 2023-11-09 LAB — ETHANOL: Alcohol, Ethyl (B): <15 mg/dL (ref <15)

## 2023-11-09 LAB — HEPARIN LEVEL (UNFRACTIONATED): Heparin Unfractionated: >1.1 [IU]/mL — ABNORMAL HIGH (ref 0.30–0.70)

## 2023-11-09 MED ORDER — ONDANSETRON HCL 4 MG/2ML IJ SOLN: 4.0000 mg | Freq: Four times a day (QID) | INTRAMUSCULAR | Status: DC | PRN

## 2023-11-09 MED ORDER — VANCOMYCIN HCL 1500 MG/300ML IV SOLN
1500.0000 mg | Freq: Once | INTRAVENOUS | Status: DC
  Filled 2023-11-09: qty 300

## 2023-11-09 MED ORDER — ACETAMINOPHEN 325 MG PO TABS
650.0000 mg | ORAL_TABLET | ORAL | Status: DC | PRN
  Administered 2023-11-10 – 2023-11-12 (×3): 650 mg via ORAL
  Filled 2023-11-09 (×3): qty 2

## 2023-11-09 MED ORDER — FENTANYL CITRATE PF 50 MCG/ML IJ SOSY
50.0000 ug | PREFILLED_SYRINGE | Freq: Once | INTRAMUSCULAR | Status: AC
  Administered 2023-11-09: 50 ug via INTRAVENOUS
  Filled 2023-11-09: qty 1

## 2023-11-09 MED ORDER — ALBUTEROL SULFATE (2.5 MG/3ML) 0.083% IN NEBU: 2.5000 mg | INHALATION_SOLUTION | Freq: Four times a day (QID) | RESPIRATORY_TRACT | Status: DC | PRN

## 2023-11-09 MED ORDER — ORAL CARE MOUTH RINSE: 15.0000 mL | OROMUCOSAL | Status: DC | PRN

## 2023-11-09 MED ORDER — ASPIRIN 81 MG PO TBEC: 81.0000 mg | DELAYED_RELEASE_TABLET | Freq: Every day | ORAL | Status: DC

## 2023-11-09 MED ORDER — THIAMINE HCL 100 MG/ML IJ SOLN
100.0000 mg | Freq: Every day | INTRAMUSCULAR | Status: DC
  Administered 2023-11-09 – 2023-11-13 (×5): 100 mg via INTRAVENOUS
  Filled 2023-11-09 (×5): qty 2

## 2023-11-09 MED ORDER — IOHEXOL 350 MG/ML SOLN
100.0000 mL | Freq: Once | INTRAVENOUS | Status: AC | PRN
  Administered 2023-11-09: 100 mL via INTRAVENOUS

## 2023-11-09 MED ORDER — VANCOMYCIN HCL 500 MG IV SOLR
500.0000 mg | Freq: Once | INTRAVENOUS | Status: AC
  Administered 2023-11-09: 500 mg via INTRAVENOUS

## 2023-11-09 MED ORDER — ONDANSETRON HCL 4 MG/2ML IJ SOLN
4.0000 mg | Freq: Once | INTRAMUSCULAR | Status: AC
  Administered 2023-11-09: 4 mg via INTRAVENOUS
  Filled 2023-11-09: qty 2

## 2023-11-09 MED ORDER — HEPARIN (PORCINE) 25000 UT/250ML-% IV SOLN
850.0000 [IU]/h | INTRAVENOUS | Status: DC
  Administered 2023-11-09: 850 [IU]/h via INTRAVENOUS
  Filled 2023-11-09: qty 250

## 2023-11-09 MED ORDER — ATORVASTATIN CALCIUM 10 MG PO TABS
20.0000 mg | ORAL_TABLET | Freq: Every day | ORAL | Status: DC
Start: 2023-11-09 — End: 2023-11-13
  Administered 2023-11-09 – 2023-11-13 (×5): 20 mg via ORAL
  Filled 2023-11-09 (×5): qty 2

## 2023-11-09 MED ORDER — LACTATED RINGERS IV BOLUS: 250.0000 mL | Freq: Once | INTRAVENOUS | Status: DC

## 2023-11-09 MED ORDER — VANCOMYCIN HCL IN DEXTROSE 1-5 GM/200ML-% IV SOLN
1000.0000 mg | Freq: Once | INTRAVENOUS | Status: AC
  Administered 2023-11-09: 1000 mg via INTRAVENOUS
  Filled 2023-11-09: qty 200

## 2023-11-09 MED ORDER — VANCOMYCIN HCL 750 MG/150ML IV SOLN
750.0000 mg | INTRAVENOUS | Status: DC
Start: 2023-11-11 — End: 2023-11-11
  Filled 2023-11-09: qty 150

## 2023-11-09 MED ORDER — MIDODRINE HCL 5 MG PO TABS
15.0000 mg | ORAL_TABLET | Freq: Three times a day (TID) | ORAL | Status: DC
  Administered 2023-11-09 (×3): 15 mg via ORAL
  Filled 2023-11-09 (×3): qty 3

## 2023-11-09 MED ORDER — APIXABAN 2.5 MG PO TABS
2.5000 mg | ORAL_TABLET | Freq: Two times a day (BID) | ORAL | Status: DC
  Administered 2023-11-09 – 2023-11-10 (×2): 2.5 mg via ORAL
  Filled 2023-11-09 (×2): qty 1

## 2023-11-09 MED ORDER — SODIUM CHLORIDE 0.9 % IV SOLN
1.0000 g | INTRAVENOUS | Status: DC
  Administered 2023-11-10 – 2023-11-12 (×3): 1 g via INTRAVENOUS
  Filled 2023-11-09 (×4): qty 20

## 2023-11-09 MED ORDER — IBUPROFEN 200 MG PO TABS
800.0000 mg | ORAL_TABLET | Freq: Three times a day (TID) | ORAL | Status: DC
  Administered 2023-11-09 – 2023-11-13 (×10): 800 mg via ORAL
  Filled 2023-11-09 (×11): qty 4

## 2023-11-09 MED ORDER — SODIUM CHLORIDE 0.9 % IV SOLN
1.0000 g | Freq: Three times a day (TID) | INTRAVENOUS | Status: DC
  Administered 2023-11-09: 1 g via INTRAVENOUS

## 2023-11-09 MED ORDER — HYDROMORPHONE HCL 1 MG/ML IJ SOLN
0.5000 mg | Freq: Once | INTRAMUSCULAR | Status: AC
  Administered 2023-11-09: 0.5 mg via INTRAVENOUS
  Filled 2023-11-09: qty 1

## 2023-11-09 NOTE — Consult Note (Signed)
 WOC Nurse ostomy consult note Stoma type/location: ileostomy; present for unknown amount of time Stomal assessment: see nursing flow sheets Peristomal assessment: not assessed  Treatment options for stomal/peristomal skin: Use barrier ring with ileostomy to protect skin  Output see nursing flow sheets Ostomy pouching: 1pc.soft convex with 2" barrier ring Old ostomy Supplies;  Soft convex lawson [id number], barrier ring [id number], change 2x wk No teaching needed, ok for bedside nursing to change pouch    Re consult if needed, will not follow at this time. Thanks  Elimelech Houseman [employer], RN,CWOCN, CNS, CWON-AP [phone])

## 2023-11-09 NOTE — ED Notes (Signed)
 Called Karen at [facility] for transport

## 2023-11-09 NOTE — H&P (Addendum)
 History and Physical    Patient: Joshua Riley ZOX:[id number] DOB: [birth year] DOA: 11/08/2023 DOS: the patient was seen and examined on 11/09/2023 PCP: Joshua Jaeger, MD  Patient coming from:  [facility] Chief complaint: Chief Complaint  Patient presents with   Chest Pain   HPI:  Joshua Riley is a 88 y.o. 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  Chest heaviness and chest pains started about an hour prior to his arrival at the facility and describes it as bilateral mid chest across the chest associated with shortness of breath no radiation.  Patient is compliant with dialysis and last dialysis session was not Friday.pt drink one or two drinks a week and no h/o of withdrawal.Pt also describes abd pain and tenderness on exam.   ED Course: Pt at bedside  is no distress alert awake oriented. Vital signs in the ED were notable for the following:  Vitals:   11/09/23 1457 11/09/23 1700 11/09/23 1942 11/09/23 2005  BP: 121/69  (!) 97/44 103/60  Pulse: 85  80   Temp: 98.7 F (37.1 C)  98.1 F (36.7 C)   Resp: 20  18 18   Height:  5\' 8"  (1.727 m)    Weight:  68.8 kg    SpO2: 91%  93% 93%  TempSrc: Oral  Oral   BMI (Calculated):  23.07     >>ED evaluation thus far shows: - BMP is abnormal with BUN of 67 creatinine of 10.10 anion gap of 20 GFR of 5. -Troponin of 428 and repeat troponin of 374. EKG shows A-fib at 93 with a right bundle branch block and a prolonged QTc of 522, PVCs.patient also had elevated troponins and  cardiology was consulted with recommendations for heparin  gtt. - CBC shows white count of 15.8 hemoglobin of 9.8 MCV of 116.5 and platelets of 197. -  2D Echo 05/2022 shows : 1. Left ventricular ejection fraction, by estimation, is 60 to 65%. The  left ventricle has normal function. The left ventricle has no regional  wall motion abnormalities. There is mild left ventricular hypertrophy.  Left ventricular diastolic parameters  are consistent with Grade I diastolic dysfunction (impaired relaxation).  Elevated left atrial pressure. The average left ventricular global  longitudinal strain is -19.4 %. The global longitudinal strain is normal.   2. Right ventricular systolic function is normal. The right ventricular  size is normal. There is normal pulmonary artery systolic pressure.   3. The mitral valve is normal in structure. Trivial mitral valve  regurgitation. No evidence of mitral stenosis.   4. The aortic valve is normal in structure. Aortic valve regurgitation is  mild. No aortic stenosis is present.   5. Aortic dilatation noted. There is borderline dilatation of the aortic  root, measuring 38 mm. There is mild dilatation of the ascending aorta,  measuring 42 mm.   6. The inferior vena cava is normal in size with greater than 50%  respiratory variability, suggesting right atrial pressure of 3 mmHg. -CTA dissection protocol was negative for aortic dissection but did show concerning findings of sacral osteomyelitis. >>While in the ED patient  received the following: Medications  aspirin  tablet 325 mg (325 mg Oral Given 11/09/23 0937)  nitroGLYCERIN (NITROSTAT) SL tablet 0.4 mg (has no administration in time range)  heparin  ADULT infusion 100 units/mL (25000 units/250mL) (850 Units/hr Intravenous New Bag/Given 11/09/23 0937)  midodrine  (PROAMATINE ) tablet 15 mg (15 mg Oral Given 11/09/23 1149)  meropenem  (MERREM ) 1 g in sodium chloride  0.9 % 100 mL IVPB (has no administration in time range)   fentaNYL  (SUBLIMAZE ) injection 50 mcg (50 mcg Intravenous Given 11/09/23 0006)  iohexol  (OMNIPAQUE ) 350 MG/ML injection 100 mL (100 mLs Intravenous Contrast Given 11/09/23 0020)  fentaNYL  (SUBLIMAZE ) injection 50 mcg (50 mcg Intravenous Given 11/09/23 0054)  vancomycin  (VANCOCIN ) IVPB 1000 mg/200 mL premix (0 mg Intravenous Stopped 11/09/23 0341)    Followed by  vancomycin  (VANCOCIN ) 500 mg in sodium chloride  0.9 % 100 mL IVPB (0 mg Intravenous Stopped 11/09/23 0525)  HYDROmorphone  (DILAUDID ) injection 0.5 mg (0.5 mg Intravenous Given 11/09/23 0230)  ondansetron  (ZOFRAN ) injection 4 mg (4 mg Intravenous Given 11/09/23 1405)  fentaNYL  (SUBLIMAZE ) injection 50 mcg (50 mcg Intravenous Given 11/09/23 1405)   Review of Systems  Respiratory:  Positive for shortness of breath.   Gastrointestinal:  Positive for abdominal pain and nausea.  Neurological:  Positive for weakness.   Past Medical History:  Diagnosis Date   A-fib (HCC)    Anemia    Arthritis    Cancer (HCC)    Basal cell   COVID-19    2021   Dysrhythmia    Afib-controlled on eliquis    ESRD (end stage renal disease) (HCC) 10/22/2021   Glaucoma 11/18/2021   History of DVT (deep vein thrombosis)    Hydronephrosis    managed wtih a PCN   Idiopathic neuropathy 10/22/2021   lyrica     Ileostomy in place [facility])    Obstructive uropathy    With chronic left nephrostomy   Old retinal detachment, total or subtotal    Orthostatic hypotension 10/22/2021   Sleep apnea    does not need a machine   Stroke (HCC)    Ulcerative colitis (HCC)    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   Past Surgical History:  Procedure Laterality Date   BASAL CELL CARCINOMA EXCISION     10/23   COLON SURGERY     creation of j pouch     and subsequent takedown of j pouch   EYE SURGERY     IR NEPHROSTOMY EXCHANGE LEFT  12/10/2021   IR NEPHROSTOMY EXCHANGE LEFT  04/22/2022   IR NEPHROSTOMY EXCHANGE LEFT  07/29/2022   IR NEPHROSTOMY EXCHANGE LEFT   10/28/2022   IR NEPHROSTOMY EXCHANGE LEFT  02/05/2023   IR NEPHROSTOMY EXCHANGE LEFT  05/07/2023   IR NEPHROSTOMY EXCHANGE LEFT  06/25/2023   IR NEPHROSTOMY EXCHANGE LEFT  07/17/2023   IR NEPHROSTOMY EXCHANGE LEFT  10/22/2023   LEFT HEART CATH AND CORONARY ANGIOGRAPHY N/A 07/22/2022   Procedure: LEFT HEART CATH AND CORONARY ANGIOGRAPHY;  Surgeon: Swazil[place], Peter M, MD;  Location: [facility] INVASIVE CV LAB;  Service: Cardiovascular;  Laterality: N/A;   REVISION OF ARTERIOVENOUS GORETEX GRAFT Left 05/06/2022   Procedure: REDO LEFT THIGH ARTERIOVENOUS 4-7 MM GORETEX GRAFT;  Surgeon: Dannis Dy, MD;  Location: [facility] OR;  Service: Vascular;  Laterality: Left;   SMALL INTESTINE SURGERY     TOTAL COLECTOMY      reports that he quit smoking about 40 years ago. His smoking use included cigarettes. He started smoking about 46  years ago. He has a 12 pack-year smoking history. He has never been exposed to tobacco smoke. He has never used smokeless tobacco. He reports current alcohol use of about 5.0 standard drinks of alcohol per week. He reports that he does not use drugs. Allergies  Allergen Reactions   Cephalosporins Rash   Ciprofloxacin Itching and Rash   Baclofen Other (See Comments)    Altered mental status, after accidental overdose     Family History  Problem Relation Age of Onset   Stroke Mother    Cancer Father    Esophageal cancer Brother    Prior to Admission medications   Medication Sig Start Date End Date Taking? Authorizing Provider  Cyanocobalamin (VITAMIN B-12 PO) Take 1 capsule by mouth in the morning and at bedtime.    [provider]  dorzolamide  (TRUSOPT ) 2 % ophthalmic solution Place 1 drop into the right eye 2 (two) times daily. 06/17/23   [provider]  ELIQUIS  2.5 MG TABS tablet Take 1 tablet (2.5 mg total) by mouth 2 (two) times daily. 08/07/22   Sonny Dust, MD  folic acid  (FOLVITE ) 1 MG tablet Take 1 tablet (1 mg total) by mouth daily. 07/21/23    Regalado, Belkys A, MD  gabapentin (NEURONTIN) 300 MG capsule Take 300 mg by mouth at bedtime. 09/09/23   [provider]  latanoprost  (XALATAN ) 0.005 % ophthalmic solution Place 1 drop into the right eye at bedtime.    [provider]  midodrine  (PROAMATINE ) 10 MG tablet Take 1 tablet (10 mg total) by mouth daily. Patient taking differently: Take 15 mg by mouth 3 (three) times daily. 09/24/23   Swinyer, Leilani Punter, NP  mometasone -formoterol  (DULERA ) 200-5 MCG/ACT AERO Inhale 2 puffs into the lungs 2 (two) times daily. 09/08/23   Doroteo Gasmen, MD  pantoprazole  (PROTONIX ) 40 MG tablet Take 1 tablet (40 mg total) by mouth 2 (two) times daily. 07/21/23   Regalado, Belkys A, MD  ramelteon  (ROZEREM ) 8 MG tablet Take 1 tablet (8 mg total) by mouth at bedtime. 10/27/23   Joshua Jaeger, MD  timolol  (TIMOPTIC ) 0.5 % ophthalmic solution Place 1 drop into the right eye 2 (two) times daily.    [provider]                                                                                 Vitals:   11/09/23 1457 11/09/23 1700 11/09/23 1942 11/09/23 2005  BP: 121/69  (!) 97/44 103/60  Pulse: 85  80   Resp: 20  18 18   Temp: 98.7 F (37.1 C)  98.1 F (36.7 C)   TempSrc: Oral  Oral   SpO2: 91%  93% 93%  Weight:  68.8 kg    Height:  5\' 8"  (1.727 m)     Physical Exam Vitals and nursing note reviewed.  Constitutional:      General: He is not in acute distress. HENT:     Head: Normocephalic and atraumatic.     Right Ear: Hearing normal.     Left Ear: Hearing normal.     Nose: No nasal deformity.     Mouth/Throat:     Lips:  Pink.  Eyes:     General: Lids are normal.     Extraocular Movements: Extraocular movements intact.  Cardiovascular:     Rate and Rhythm: Normal rate and regular rhythm.     Heart sounds: Normal heart sounds.  Pulmonary:     Effort: Pulmonary effort is normal.     Breath sounds: Examination of the right-middle field reveals rales. Examination of  the left-middle field reveals rales. Examination of the right-lower field reveals rales. Examination of the left-lower field reveals rales. Rales present.  Abdominal:     General: Bowel sounds are normal. There is no distension.     Palpations: Abdomen is soft. There is no mass.     Tenderness: There is abdominal tenderness.    Musculoskeletal:     Right lower leg: No edema.     Left lower leg: No edema.  Skin:    General: Skin is warm.  Neurological:     General: No focal deficit present.     Mental Status: He is alert and oriented to person, place, and time.     Cranial Nerves: Cranial nerves 2-12 are intact.  Psychiatric:        Speech: Speech normal.     Labs on Admission: I have personally reviewed following labs and imaging studies CBC: Recent Labs  Lab 11/08/23 2240  WBC 15.8*  HGB 9.8*  HCT 29.0*  MCV 116.5*  PLT 197   Basic Metabolic Panel: Recent Labs  Lab 11/08/23 2240 11/09/23 1804  NA 136 132*  K 3.7 4.4  CL 92* 94*  CO2 25 20*  GLUCOSE 167* 137*  BUN 67* 78*  CREATININE 10.10* 11.24*  CALCIUM 10.2 8.9  MG  --  2.2   GFR: Estimated Creatinine Clearance: 4.6 mL/min (A) (by C-G formula based on SCr of 11.24 mg/dL (H)). Liver Function Tests: Recent Labs  Lab 11/09/23 1804  AST 16  ALT 32  ALKPHOS 77  BILITOT 1.1  PROT 6.5  ALBUMIN  2.6*   No results for input(s): "LIPASE", "AMYLASE" in the last 168 hours. No results for input(s): "AMMONIA" in the last 168 hours. Coagulation Profile: No results for input(s): "INR", "PROTIME" in the last 168 hours. Cardiac Enzymes: No results for input(s): "CKTOTAL", "CKMB", "CKMBINDEX", "TROPONINI" in the last 168 hours. BNP (last 3 results) No results for input(s): "PROBNP" in the last 8760 hours. HbA1C: No results for input(s): "HGBA1C" in the last 72 hours. CBG: No results for input(s): "GLUCAP" in the last 168 hours. Lipid Profile: No results for input(s): "CHOL", "HDL", "LDLCALC", "TRIG", "CHOLHDL",  "LDLDIRECT" in the last 72 hours. Thyroid Function Tests: No results for input(s): "TSH", "T4TOTAL", "FREET4", "T3FREE", "THYROIDAB" in the last 72 hours. Anemia Panel: Recent Labs    11/09/23 1804  VITAMINB12 5,684*   Urine analysis:    Component Value Date/Time   COLORURINE STRAW (A) 07/09/2023 1221   APPEARANCEUR TURBID (A) 07/09/2023 1221   LABSPEC  07/09/2023 1221    TEST NOT REPORTED DUE TO COLOR INTERFERENCE OF URINE PIGMENT   PHURINE  07/09/2023 1221    TEST NOT REPORTED DUE TO COLOR INTERFERENCE OF URINE PIGMENT   GLUCOSEU (A) 07/09/2023 1221    TEST NOT REPORTED DUE TO COLOR INTERFERENCE OF URINE PIGMENT   HGBUR (A) 07/09/2023 1221    TEST NOT REPORTED DUE TO COLOR INTERFERENCE OF URINE PIGMENT   BILIRUBINUR (A) 07/09/2023 1221    TEST NOT REPORTED DUE TO COLOR INTERFERENCE OF URINE PIGMENT   KETONESUR (A)  07/09/2023 1221    TEST NOT REPORTED DUE TO COLOR INTERFERENCE OF URINE PIGMENT   PROTEINUR (A) 07/09/2023 1221    TEST NOT REPORTED DUE TO COLOR INTERFERENCE OF URINE PIGMENT   NITRITE (A) 07/09/2023 1221    TEST NOT REPORTED DUE TO COLOR INTERFERENCE OF URINE PIGMENT   LEUKOCYTESUR (A) 07/09/2023 1221    TEST NOT REPORTED DUE TO COLOR INTERFERENCE OF URINE PIGMENT   Radiological Exams on Admission: CT Angio Chest/Abd/Pel for Dissection W and/or Wo Contrast Result Date: 11/09/2023 CLINICAL DATA:  Acute aortic syndrome suspected. Chest pain and shortness of breath. EXAM: CT ANGIOGRAPHY CHEST, ABDOMEN AND PELVIS TECHNIQUE: Non-contrast CT of the chest was initially obtained. Multidetector CT imaging through the chest, abdomen and pelvis was performed using the standard protocol during bolus administration of intravenous contrast. Multiplanar reconstructed images and MIPs were obtained and reviewed to evaluate the vascular anatomy. RADIATION DOSE REDUCTION: This exam was performed according to the departmental dose-optimization program which includes automated exposure  control, adjustment of the mA and/or kV according to patient size and/or use of iterative reconstruction technique. CONTRAST:  OMNIPAQUE  IOHEXOL  350 MG/ML SOLN COMPARISON:  Chest radiograph 11/08/2023; CT abdomen pelvis 09/06/2023; CT chest 08/18/2023 FINDINGS: CTA CHEST FINDINGS Cardiovascular: No intramural hematoma, penetrating atherosclerotic ulcer, or dissection. Normal caliber thoracic aorta. Aortic and coronary artery atherosclerotic calcification. Right subclavian vein stent. Mediastinum/Nodes: Trachea is patent. Unremarkable thyroid and esophagus. No lymphadenopathy. Lungs/Pleura: Lower lobe atelectasis and scarring. Otherwise no focal consolidation. No pleural effusion or pneumothorax. 7 mm right lower lobe nodule on series 7/image 103 is unchanged. Musculoskeletal: No acute fracture. Review of the MIP images confirms the above findings. CTA ABDOMEN AND PELVIS FINDINGS VASCULAR Aorta: Calcified atherosclerotic plaque throughout the abdominal aorta without hemodynamically significant stenosis. No aneurysm or dissection. Celiac: Severe narrowing at the origin secondary to calcified atherosclerotic plaque. No aneurysm or dissection. SMA: Calcified plaque without hemodynamically significant stenosis. No aneurysm or dissection. Renals: Calcified plaque at the origin of the left renal artery. No hemodynamically significant stenosis. No aneurysm or dissection. IMA: Not visualized and likely occluded at the origin. Inflow: Redemonstrated surgical grafts arising from the left common femoral artery and vein extending inferiorly in the subcutaneous ventral left thigh. No hemodynamically significant stenosis. No aneurysm or dissection. Veins: No obvious acute venous abnormality within the limitations of this arterial phase study. Review of the MIP images confirms the above findings. NON-VASCULAR Hepatobiliary: Cholelithiasis. No evidence of acute cholecystitis. Unremarkable liver and biliary tree. Pancreas:  Unremarkable. Spleen: Unremarkable. Adrenals/Urinary Tract: Normal adrenal glands. Atrophic native kidneys with multiple small hypoattenuating lesions statistically likely to represent cysts. No follow-up recommended. Percutaneous nephrostomy tube in the left kidney. No urinary calculi or hydronephrosis. Irregular asymmetric left wall thickening about the nondistended bladder. 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 Herniation of a nonobstructed loop of small bowel into the parastomal hernia. No bowel wall thickening or obstruction. Stomach is within normal limits. Lymphatic: No lymphadenopathy. Reproductive: No acute abnormality.  Penile prosthesis. Other: The presacral peripherally enhancing fluid collection has slightly increased in size compared to 09/06/2023 now measuring 4.7 x 5.5 cm in the axial plane, previously 3.7 x 5.5 cm. Extension of the fluid collection into the left ischial anal fossa. Mild adjacent stranding. No free intraperitoneal air. Musculoskeletal: No acute fracture. The presacral fluid collection abuts the anterior cortex of the sacrum which demonstrates mild irregularity suspicious for osteomyelitis. 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 Review of the  MIP images confirms the above findings. IMPRESSION: 1. No acute aortic syndrome. 2. The presacral fluid collection has slightly increased in size compared to 09/06/2023. Extension of the fluid collection into the left ischioanal fossa. The presacral fluid collection abuts the anterior cortex of the sacrum which demonstrates mild irregularity suspicious for osteomyelitis. 3. Irregular asymmetric left bladder wall thickening similar to prior. Cystitis versus malignancy. 4. Cholelithiasis. 5. Stable 7 mm right lower lobe nodule. Electronically Signed   By: Rozell Cornet M.D.   On: 11/09/2023 00:56   DG Chest 2 View Result Date: 11/08/2023 CLINICAL DATA:  Shortness of breath  and chest pain EXAM: CHEST - 2 VIEW COMPARISON:  Radiograph 07/07/2023 FINDINGS: Stable cardiomediastinal silhouette. Calcified tortuous aorta. Low lung volumes accentuate pulmonary vascularity. Chronic bronchitic changes. Left basilar atelectasis. No pleural effusion or pneumothorax. Right subclavian vascular stent. IMPRESSION: Chronic bronchitic changes.  Left basilar atelectasis/scarring. Electronically Signed   By: Rozell Cornet M.D.   On: 11/08/2023 23:06   Data Reviewed: Relevant notes from primary care and specialist visits, past discharge summaries as available in EHR, including Care Everywhere. Prior diagnostic testing as pertinent to current admission diagnoses, Updated medications and problem lists for reconciliation ED course, including vitals, labs, imaging, treatment and response to treatment,Triage notes, nursing and pharmacy notes and ED provider's notes Notable results as noted in HPI.Discussed case with EDMD/ ED APP/ or Specialty MD on call and as needed.  Assessment & Plan  Pt is a 88 year old male with known multiple chronic problems including ulcerative colitis coli colostomy, CAD, ESRD on HD admitted for chest pain ant chest  with elevated cardiac biomarker.   >>Chest pain/ NSTEMI: Initial EKG today shows A-fib at 93, PVCs, QRS of 144, right bundle branch block, QTc prolonged at 522, ST elevation in lead III and aVF, repeat EKG shows sinus rhythm at 85 PR interval of 238 QRS of 160 QTc of 489, again ST elevations in lead III aVF. D/d include demand ischemia from hypotension or ischemia.  Continue statin therapy, as needed nitroglycerin, continue heparin  gtt. Appreciate cardiology consult management.  >>ESRD on HD: Nephrology consulted. Dr.Kruska.  Continue midodrine . Percutaneous  nephrostomy tube in the left kidney. No urinary calculi or hydronephrosis.  Irregular asymmetric left wall thickening about the nondistended bladder - needs urology eval.   >>SOB ( few months  ): D/d include cardiac due to ischemia or volume / renal due to volume, although pt is clinically stable and has no edema or jvd does have bl basliar rales. Supplemental oxygen and HD for volume management. Pulmonology note reviewed on 10/27/2023  Patient treated for asthma with exertional dyspnea based on PFT done recently patient was to continue his Dulera .  Continue with as needed albuterol  and DuoNeb therapy.   >>C/H A.fib: Cont a.fib.  Continue Eliquis .   >>Anemia: Chronic since 2023 May.  Suspect combination of IDA and anemia of chronic kidney disease.  Current hemoglobin of 9.8 will follow transfuse as deemed appropriate.  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 Continue vancomycin  and merem.  General surgery consulted.  >>UC: Colostomy consult and care.     >> History of GERD: -On PPI.  >>Abdominal pain: D/d include from cholelithiasis or vascular , pt does have severe narrowing at the celiac origin secondary to calcified atherosclerotic plaque along with a calcified plaque at the SMA without hemodynamically  significant stenosis.  Patient also has surgical grafts in the left common femoral artery and vein in the subcutaneous ventral left thigh. Will obtain a right upper quadrant ultrasound and defer to a.m. team MD to consult vascular.  DVT prophylaxis:  Eliquis  Consults:  Nephrology Cardiology General Surgery Advance Care Planning:    Code Status: Full Code   Family Communication:  None Disposition Plan:  Home Severity of Illness: The appropriate patient status for this patient is INPATIENT. Inpatient status is judged to be reasonable and necessary in order to provide the required intensity of service to ensure the patient's safety. The patient's presenting symptoms, physical exam findings, and initial  radiographic and laboratory data in the context of their chronic comorbidities is felt to place them at high risk for further clinical deterioration. Furthermore, it is not anticipated that the patient will be medically stable for discharge from the hospital within 2 midnights of admission.   * I certify that at the point of admission it is my clinical judgment that the patient will require inpatient hospital care spanning beyond 2 midnights from the point of admission due to high intensity of service, high risk for further deterioration and high frequency of surveillance required.*  Unresulted Labs (From admission, onward)     Start     Ordered   11/10/23 0500  CBC  Daily,   R      11/09/23 1550   11/10/23 0500  Lipid panel  Tomorrow morning,   R       Question:  Specimen collection method  Answer:  Lab=Lab collect   11/09/23 1740   11/09/23 2058  Culture, blood (Routine X 2) w Reflex to ID Panel  BLOOD CULTURE X 2,   R      11/09/23 2057   11/09/23 2044  Ethanol  Once,   R       Question:  Specimen collection method  Answer:  Lab=Lab collect   11/09/23 2043   11/09/23 2041  Hemoglobin A1c  Add-on,   AD       Question:  Specimen collection method  Answer:  Lab=Lab collect   11/09/23 2040            Orders Placed This Encounter  Procedures   Critical Care   Culture, blood (Routine X 2) w Reflex to ID Panel   DG Chest 2 View   CT Angio Chest/Abd/Pel for Dissection W and/or Wo Contrast   US  Abdomen Limited RUQ (LIVER/GB)   Basic metabolic panel   CBC   Heparin  level (unfractionated)   APTT   CBC   Comprehensive metabolic panel   Magnesium    Vitamin B12   Lipid panel   Hemoglobin A1c   Ethanol   Diet Heart Room service appropriate? Yes; Fluid consistency: Thin   Document Height and Actual Weight   Patient has an active order for admit to inpatient/place in observation   Refer to Sidebar Report Refer to ICU, Med-Surg, Progressive, and Step-Down Mobility Protocol Sidebars    Apply Angina, Rule Out Myocardial Infarction Care Plan   Vital signs with O2 sat q4 hours x 24 hours, then q shift   Cardiac Monitoring Continuous x 24 hours Indications for use: Other; other indications for use: chest pain   RN may order Cardiology PRN Orders utilizing "Cardiology PRN medications" (through manage orders) for the following patient needs:   Bed rest   Complete oral care assessment tool on admission, transfer, and q shift   Refer to  Sidebar Report Adult Oral Care Protocol   Brush teeth with toothbrush and toothpaste 3 times daily   Apply moisturizer in mouth and lips prn   Ostomy Care   Full code   Consult to hospitalist   Inpatient consult to Cardiology   heparin  per pharmacy consult   vancomycin  per pharmacy consult   Meropenem  per pharmacy consult         Consult to nephrology Consult Timeframe: ROUTINE - requires response within 24 hours; Reason for Consult? HD.   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   Consult to wound, ostomy, continence   EKG   EKG 12-Lead   EKG   EKG   EKG 12-Lead (at 6am)   ECHOCARDIOGRAM COMPLETE   Type and screen   Admit to Inpatient (patient's expected length of stay will be greater than 2 midnights or inpatient only procedure)    Author: Lavanda Porter, MD 12 pm -8 pm. 11/09/2023 9:06 PM >>Please note for any concern,or critical results after hours past 8pm please contact the [facility] hospital[facility] floor coverage provider from 7 PM- 7 AM. For on call review [email or web address], username [email or web address] and PW: your phone number<<

## 2023-11-09 NOTE — ED Notes (Signed)
 Patient transported to CT

## 2023-11-09 NOTE — Progress Notes (Signed)
 PHARMACY - ANTICOAGULATION CONSULT NOTE  Pharmacy Consult for Heparin  (Apixaban  on hold) Indication: chest pain/ACS, hx of Afib  Allergies  Allergen Reactions   Cephalosporins Rash   Ciprofloxacin Itching and Rash   Baclofen Other (See Comments)    Altered mental status, after accidental overdose     Vital Signs: Temp: 97.5 F (36.4 C) (05/05 0435) Temp Source: Temporal (05/05 0435) BP: 101/63 (05/05 0430) Pulse Rate: 97 (05/05 0100)  Labs: Recent Labs    11/08/23 2240  HGB 9.8*  HCT 29.0*  PLT 197  CREATININE 10.10*    Estimated Creatinine Clearance: 5.2 mL/min (A) (by C-G formula based on SCr of 10.1 mg/dL (H)).   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 Pt is on Apixaban  PTA for history of atrial fibrillation. Holding apixaban  and starting heparin , last dose of apixaban  per MD was 5/4 PM. Will start heparin  approximately 12 hours from that. Above labs reviewed. Anticipate using aPTT to dose for now.  Goal of Therapy:  Heparin  level 0.3-0.7 units/ml aPTT 66-102 seconds Monitor platelets by anticoagulation protocol: Yes   Plan:  Start heparin  drip at 850 units/hr at 1000 today 1800 aPTT and heparin  level Daily CBC,  heparin  level, and aPTT Monitor for bleeding  Silvestre Drum, PharmD, BCPS Clinical Pharmacist Phone: [phone]

## 2023-11-09 NOTE — ED Notes (Signed)
 Dr. Adrain Alar aware that troponin is 374

## 2023-11-09 NOTE — Progress Notes (Signed)
 ED Pharmacy Antibiotic Sign Off An antibiotic consult was received from an ED provider for Vancomycin  and Meropenem  per pharmacy dosing for osteomyelitis. A chart review was completed to assess appropriateness.   The following one time order(s) were placed:  Vancomycin  1500 mg IV Meropenem  1 g IV  Further antibiotic and/or antibiotic pharmacy consults should be ordered by the admitting provider if indicated.   Thank you for allowing pharmacy to be a part of this patient's care.   Carlota Chestnut, [facility]  Clinical Pharmacist 11/09/23 1:43 AM

## 2023-11-09 NOTE — Consult Note (Addendum)
 Cardiology Consultation   Patient ID: Joshua Riley MRN: [id number]; DOB: [birth year]  Admit date: 11/08/2023 Date of Consult: 11/09/2023  PCP:  Almira Jaeger, MD   [facility] Providers Cardiologist:  Eilleen Grates, MD        Patient Profile:   Rush Brust is a 88 y.o. male with a history of normal coronaries on cardiac catheterization in 07/2022, persistent atrial fibrillation on Eliquis , prior CVA, prior DVT, hypotension on Midodrine  on dialysis days, ESRD on hemodialysis on M/W/F, obstructive uropathy s/p chronic left percutenaous nephrostomy, ulcerative colitis s/p colostomy in 2012, GI bleed, and obstructive sleep apnea who is being seen 11/09/2023 for the evaluation of NSTEMI at the request of Dr. Lydia Sams.  History of Present Illness:   Mr. Vaneck is a 88 year old male with the above history who is followed by Dr. Lavonne Prairie. Patient was previously followed by Cardiology in [place]  but was first seen by [facility] in 05/2022 after moving to the area. She has a history of persistent atrial fibrillation and has been maintained on Eliquis . She has not been on any AV nodal agents due to hypotension with hemodialysis requiring Midodrine . Echo in 05/2022 for further evaluation of dyspnea showed LVEF of 60-65% with no regional wall motion abnormalities, mild LVH, and grade 1 diastolic dysfunction as well as borderline dilatation of the aortic root and mild dilatation of the ascending aorta measuring 38mm and 42mm. She underwent LHC in 07/2023 due to persistent dyspnea which showed normal coronaries and normal LVEDP. Monitor in 07/2022 showed underlying sinus rhythm with 2,759 run of SVT (longest run 14 beats), intermittent episodes of 2nd degree block Mobitz I (Wenckebach), isolated PACs (4.1%), and rare PACs (1.0%).   He was admitted in 07/2023 for sepsis secondary to an infected right groin abscess. General Surgery was consulted and IR placed a drain. He was treated with IV  antibiotics. Hospitalization was complicated by small bowel obstruction vs ileus, atrial fibrillation requiring IV Amiodarone  which was subsequently stopped due to bradycardia, acute on chronic anemia requiring blood transfusion.   Patient was last seen by Slater Duncan, NP, in 08/2023 at which time he reported significant exhaustion and fatigue particularly following dialysis but was otherwise stable from a cardiac standpoint.   He was admitted in 09/2023 for small bowel obstruction. General Surgery was consulted and he was treated conservatively. She had hyperkalemia during admission which was treated with Lokelma  and hemodialysis. CT also showed a moderate right hydroureter distally and outpatient Urology referral was recommended.   She presented back to the [facility] ED on 11/08/2023 for chest pain and shortness of breath. EKG showed normal sinus rhythm with PACs and RBBB but no acute ischemic changes compared to prior tracings. High-sensitivity troponin 428 >> 374. Chest x-ray showed chronic bronchitic changes and left basilar atelectasis/ scarring. Chest CTA showed no acute aortic syndrome but did showed extension of the presacral fluid collection into the left ischional fossa as well as irregular asymmetric left bladder wall thickening similar to prior (cystitis vs malignancy).  WBC 15.8, Hgb 9.8, Plts 197. Na 136, K 3.7, Glucose 167, BUN 67, Cr 10.10. Patient was started on Vancomycin  and Meropnem given concern for possible sacral osteomyelitis. He was also started on IV Heparin . He was admitted to Internal Medicine at [facility] and Cardiology was consulted.   Patient reports sudden onset of chest pain last night while watching TV. He described this as a chest pressure and states it felt like "400 lbs" was  sitting on his chest. He felt like he was having a heart attack. Pain resolved with Fentanyl  and Dilaudid  in the ED but then returned after this wore off. He reports 7/10 chest pain  right now but does not appear to be in any distress. He states he "can feel it when talking" and is a little worse when he takes a deep breath. He denies any other episodes of chest pain. No exertional chest pain. He continues to have dyspnea on exertion. He states he frequently has to stop while walking due to dyspnea and weakness. This is worse after dialysis so family has wondered if this is due to low BP. He saw Pulmonology for his dyspnea on 10/27/2023. PFTs showed mild restriction with significant improvement post-bronchodilator so symptoms were felt to be due to asthma. He was not using his Dulera  inhaler consistently and he was advised to start doing so. He denies any significant dyspnea at rest. No orthopnea, PND, or edema. No palpitations. He reports some lightheadedness if he stands to quickly but no falls or syncope. No recent fevers. No URI or GI symptoms. He still makes a little urine. No over bleeding issues.  Past Medical History:  Diagnosis Date   A-fib (HCC)    Anemia    Arthritis    Cancer (HCC)    Basal cell   COVID-19    2021   Dysrhythmia    Afib-controlled on eliquis    ESRD (end stage renal disease) (HCC) 10/22/2021   Glaucoma 11/18/2021   History of DVT (deep vein thrombosis)    Hydronephrosis    managed wtih a PCN   Idiopathic neuropathy 10/22/2021   lyrica     Ileostomy in place [facility])    Obstructive uropathy    With chronic left nephrostomy   Old retinal detachment, total or subtotal    Orthostatic hypotension 10/22/2021   Sleep apnea    does not need a machine   Stroke (HCC)    Ulcerative colitis (HCC)    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    Past Surgical History:  Procedure Laterality Date   BASAL CELL CARCINOMA EXCISION     10/23   COLON SURGERY     creation of j pouch     and subsequent takedown of j pouch   EYE SURGERY     IR NEPHROSTOMY EXCHANGE LEFT  12/10/2021   IR NEPHROSTOMY EXCHANGE LEFT  04/22/2022   IR NEPHROSTOMY  EXCHANGE LEFT  07/29/2022   IR NEPHROSTOMY EXCHANGE LEFT  10/28/2022   IR NEPHROSTOMY EXCHANGE LEFT  02/05/2023   IR NEPHROSTOMY EXCHANGE LEFT  05/07/2023   IR NEPHROSTOMY EXCHANGE LEFT  06/25/2023   IR NEPHROSTOMY EXCHANGE LEFT  07/17/2023   IR NEPHROSTOMY EXCHANGE LEFT  10/22/2023   LEFT HEART CATH AND CORONARY ANGIOGRAPHY N/A 07/22/2022   Procedure: LEFT HEART CATH AND CORONARY ANGIOGRAPHY;  Surgeon: Swaziland, Peter M, MD;  Location: [facility] INVASIVE CV LAB;  Service: Cardiovascular;  Laterality: N/A;   REVISION OF ARTERIOVENOUS GORETEX GRAFT Left 05/06/2022   Procedure: REDO LEFT THIGH ARTERIOVENOUS 4-7 MM GORETEX GRAFT;  Surgeon: Dannis Dy, MD;  Location: [facility] OR;  Service: Vascular;  Laterality: Left;   SMALL INTESTINE SURGERY     TOTAL COLECTOMY       Home Medications:  Prior to Admission medications   Medication Sig Start Date End Date Taking? Authorizing Provider  Cyanocobalamin (VITAMIN B-12 PO) Take 1 capsule by mouth in the morning and at bedtime.  [provider]  dorzolamide  (TRUSOPT ) 2 % ophthalmic solution Place 1 drop into the right eye 2 (two) times daily. 06/17/23   [provider]  ELIQUIS  2.5 MG TABS tablet Take 1 tablet (2.5 mg total) by mouth 2 (two) times daily. 08/07/22   Sonny Dust, MD  folic acid  (FOLVITE ) 1 MG tablet Take 1 tablet (1 mg total) by mouth daily. 07/21/23   Regalado, Belkys A, MD  gabapentin (NEURONTIN) 300 MG capsule Take 300 mg by mouth at bedtime. 09/09/23   [provider]  latanoprost  (XALATAN ) 0.005 % ophthalmic solution Place 1 drop into the right eye at bedtime.    [provider]  midodrine  (PROAMATINE ) 10 MG tablet Take 1 tablet (10 mg total) by mouth daily. Patient taking differently: Take 15 mg by mouth 3 (three) times daily. 09/24/23   Swinyer, Leilani Punter, NP  mometasone -formoterol  (DULERA ) 200-5 MCG/ACT AERO Inhale 2 puffs into the lungs 2 (two) times daily. 09/08/23   Doroteo Gasmen, MD   pantoprazole  (PROTONIX ) 40 MG tablet Take 1 tablet (40 mg total) by mouth 2 (two) times daily. 07/21/23   Regalado, Belkys A, MD  ramelteon  (ROZEREM ) 8 MG tablet Take 1 tablet (8 mg total) by mouth at bedtime. 10/27/23   Almira Jaeger, MD  timolol  (TIMOPTIC ) 0.5 % ophthalmic solution Place 1 drop into the right eye 2 (two) times daily.    [provider]    Inpatient Medications: Scheduled Meds:  aspirin   325 mg Oral Daily   midodrine   15 mg Oral TID   Continuous Infusions:  heparin  850 Units/hr (11/09/23 0937)   [START ON 11/10/2023] meropenem  (MERREM ) IV     PRN Meds: acetaminophen , nitroGLYCERIN, ondansetron  (ZOFRAN ) IV, mouth rinse  Allergies:    Allergies  Allergen Reactions   Cephalosporins Rash   Ciprofloxacin Itching and Rash   Baclofen Other (See Comments)    Altered mental status, after accidental overdose      Social History:   Social History   Socioeconomic History   Marital status: Widowed    Spouse name: Not on file   Number of children: Not on file   Years of education: Not on file   Highest education level: 12th grade  Occupational History   Not on file  Tobacco Use   Smoking status: Former    Current packs/day: 0.00    Average packs/day: 2.0 packs/day for 6.0 years (12.0 ttl pk-yrs)    Types: Cigarettes    Start date: 78    Quit date: 55    Years since quitting: 40.3    Passive exposure: Never   Smokeless tobacco: Never  Vaping Use   Vaping status: Never Used  Substance and Sexual Activity   Alcohol use: Yes    Alcohol/week: 5.0 standard drinks of alcohol    Types: 5 Shots of liquor per week    Comment: socially   Drug use: Never   Sexual activity: Not Currently  Other Topics Concern   Not on file  Social History Narrative   Widowed- lost wife of 13 years to pancreatic cancer- previously married 43 years. Son in [place] and daughter helping in [place]. 4 grandkids.       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       Hobbies: dinner with family- occasional martini   Social Drivers of Health   Financial Resource Strain: Low Risk  (10/26/2023)   Overall Financial Resource Strain (CARDIA)  Difficulty of Paying Living Expenses: Not hard at all  Food Insecurity: No Food Insecurity (11/09/2023)   Hunger Vital Sign    Worried About Running Out of Food in the Last Year: Never true    Ran Out of Food in the Last Year: Never true  Transportation Needs: No Transportation Needs (11/09/2023)   PRAPARE - Administrator, Civil Service (Medical): No    Lack of Transportation (Non-Medical): No  Physical Activity: Insufficiently Active (10/26/2023)   Exercise Vital Sign    Days of Exercise per Week: 1 day    Minutes of Exercise per Session: 10 min  Stress: Stress Concern Present (10/26/2023)   [employer] of Occupational Health - Occupational Stress Questionnaire    Feeling of Stress : To some extent  Social Connections: Moderately Integrated (11/09/2023)   Social Connection and Isolation Panel [NHANES]    Frequency of Communication with Friends and Family: More than three times a week    Frequency of Social Gatherings with Friends and Family: Twice a week    Attends Religious Services: More than 4 times per year    Active Member of [employer] or Organizations: Yes    Attends Banker Meetings: More than 4 times per year    Marital Status: Widowed  Intimate Partner Violence: Not At Risk (11/09/2023)   Humiliation, Afraid, Rape, and Kick questionnaire    Fear of Current or Ex-Partner: No    Emotionally Abused: No    Physically Abused: No    Sexually Abused: No    Family History:   Family History  Problem Relation Age of Onset   Stroke Mother    Cancer Father    Esophageal cancer Brother      ROS:  Please see the history of present illness.     Physical Exam/Data:   Vitals:   11/09/23 1342 11/09/23 1454 11/09/23 1457 11/09/23 1700  BP: (!) 102/55  121/69   Pulse: 78   85   Resp: 17  20   Temp:  98.7 F (37.1 C) 98.7 F (37.1 C)   TempSrc:  Oral Oral   SpO2: 90%  91%   Weight:    68.8 kg  Height:    5\' 8"  (1.727 m)    Intake/Output Summary (Last 24 hours) at 11/09/2023 1736 Last data filed at 11/09/2023 1600 Gross per 24 hour  Intake 52.48 ml  Output --  Net 52.48 ml      11/09/2023    5:00 PM 10/27/2023    3:00 PM 10/27/2023   11:25 AM  Last 3 Weights  Weight (lbs) 151 lb 10.8 oz 154 lb 12.8 oz 155 lb 12.8 oz  Weight (kg) 68.8 kg 70.217 kg 70.67 kg     Body mass index is 23.06 kg/m.  General: 88 y.o. Caucasian male resting comfortably in no acute distress. HEENT: Normocephalic and atraumatic. Sclera clear.  Neck: Supple.No JVD. Heart: RRR.No murmurs, gallops, or rubs.  Lungs: No increased work of breathing.  Decreased breath sounds in bilateral bases with faint crackles noted in right base. Abdomen: Soft, non-distended, and non-tender to palpation. S/p colostomy. Extremities: No lower extremity edema.    Skin: Warm and dry. Neuro: Alert and oriented x3. No focal deficits. Psych: Normal affect. Responds appropriately.   EKG:  The EKG was personally reviewed and demonstrates:  Sinus rhythm with PACs and RBBB but no acute ischemic changes compared to prior tracings.  Telemetry:  Telemetry was personally reviewed and demonstrates:  Normal  sinus rhythm with rates in the 80s.  Relevant CV Studies:  Echocardiogram 05/27/2022: Impresssions: 1. Left ventricular ejection fraction, by estimation, is 60 to 65%. The  left ventricle has normal function. The left ventricle has no regional  wall motion abnormalities. There is mild left ventricular hypertrophy.  Left ventricular diastolic parameters  are consistent with Grade I diastolic dysfunction (impaired relaxation).  Elevated left atrial pressure. The average left ventricular global  longitudinal strain is -19.4 %. The global longitudinal strain is normal.   2. Right ventricular systolic  function is normal. The right ventricular  size is normal. There is normal pulmonary artery systolic pressure.   3. The mitral valve is normal in structure. Trivial mitral valve  regurgitation. No evidence of mitral stenosis.   4. The aortic valve is normal in structure. Aortic valve regurgitation is  mild. No aortic stenosis is present.   5. Aortic dilatation noted. There is borderline dilatation of the aortic  root, measuring 38 mm. There is mild dilatation of the ascending aorta,  measuring 42 mm.   6. The inferior vena cava is normal in size with greater than 50%  respiratory variability, suggesting right atrial pressure of 3 mmHg.   Comparison(s): No prior Echocardiogram.  _______________  Left Cardiac Catheterization 07/22/2022:   The left ventricular systolic function is normal.   LV end diastolic pressure is normal.   The left ventricular ejection fraction is 55-65% by visual estimate.   No significant CAD Normal LV function Normal LVEDP   Plan: medical management  Diagnostic Dominance: Right    _______________  Monitor 07/2022:   Patch wear time was 6 days and 21 hours   Predominant rhythm is NSR with average HR 75bpm   There were 2759 runs of SVT with longest lasting 14 beats   Mobitz type I ABV was present   Occasional SVE (4.1%), rare VE (<1%)   No Afib or significant pauses   No patient triggered events   Laboratory Data:  High Sensitivity Troponin:  No results for input(s): "TROPONINIHS" in the last 720 hours.   Chemistry Recent Labs  Lab 11/08/23 2240  NA 136  K 3.7  CL 92*  CO2 25  GLUCOSE 167*  BUN 67*  CREATININE 10.10*  CALCIUM 10.2  GFRNONAA 5*  ANIONGAP 20*    No results for input(s): "PROT", "ALBUMIN ", "AST", "ALT", "ALKPHOS", "BILITOT" in the last 168 hours. Lipids No results for input(s): "CHOL", "TRIG", "HDL", "LABVLDL", "LDLCALC", "CHOLHDL" in the last 168 hours.  Hematology Recent Labs  Lab 11/08/23 2240  WBC 15.8*  RBC 2.49*   HGB 9.8*  HCT 29.0*  MCV 116.5*  MCH 39.4*  MCHC 33.8  RDW 15.5  PLT 197   Thyroid No results for input(s): "TSH", "FREET4" in the last 168 hours.  BNPNo results for input(s): "BNP", "PROBNP" in the last 168 hours.  DDimer No results for input(s): "DDIMER" in the last 168 hours.   Radiology/Studies:  CT Angio Chest/Abd/Pel for Dissection W and/or Wo Contrast Result Date: 11/09/2023 CLINICAL DATA:  Acute aortic syndrome suspected. Chest pain and shortness of breath. EXAM: CT ANGIOGRAPHY CHEST, ABDOMEN AND PELVIS TECHNIQUE: Non-contrast CT of the chest was initially obtained. Multidetector CT imaging through the chest, abdomen and pelvis was performed using the standard protocol during bolus administration of intravenous contrast. Multiplanar reconstructed images and MIPs were obtained and reviewed to evaluate the vascular anatomy. RADIATION DOSE REDUCTION: This exam was performed according to the departmental dose-optimization program which includes automated exposure  control, adjustment of the mA and/or kV according to patient size and/or use of iterative reconstruction technique. CONTRAST:  OMNIPAQUE  IOHEXOL  350 MG/ML SOLN COMPARISON:  Chest radiograph 11/08/2023; CT abdomen pelvis 09/06/2023; CT chest 08/18/2023 FINDINGS: CTA CHEST FINDINGS Cardiovascular: No intramural hematoma, penetrating atherosclerotic ulcer, or dissection. Normal caliber thoracic aorta. Aortic and coronary artery atherosclerotic calcification. Right subclavian vein stent. Mediastinum/Nodes: Trachea is patent. Unremarkable thyroid and esophagus. No lymphadenopathy. Lungs/Pleura: Lower lobe atelectasis and scarring. Otherwise no focal consolidation. No pleural effusion or pneumothorax. 7 mm right lower lobe nodule on series 7/image 103 is unchanged. Musculoskeletal: No acute fracture. Review of the MIP images confirms the above findings. CTA ABDOMEN AND PELVIS FINDINGS VASCULAR Aorta: Calcified atherosclerotic plaque  throughout the abdominal aorta without hemodynamically significant stenosis. No aneurysm or dissection. Celiac: Severe narrowing at the origin secondary to calcified atherosclerotic plaque. No aneurysm or dissection. SMA: Calcified plaque without hemodynamically significant stenosis. No aneurysm or dissection. Renals: Calcified plaque at the origin of the left renal artery. No hemodynamically significant stenosis. No aneurysm or dissection. IMA: Not visualized and likely occluded at the origin. Inflow: Redemonstrated surgical grafts arising from the left common femoral artery and vein extending inferiorly in the subcutaneous ventral left thigh. No hemodynamically significant stenosis. No aneurysm or dissection. Veins: No obvious acute venous abnormality within the limitations of this arterial phase study. Review of the MIP images confirms the above findings. NON-VASCULAR Hepatobiliary: Cholelithiasis. No evidence of acute cholecystitis. Unremarkable liver and biliary tree. Pancreas: Unremarkable. Spleen: Unremarkable. Adrenals/Urinary Tract: Normal adrenal glands. Atrophic native kidneys with multiple small hypoattenuating lesions statistically likely to represent cysts. No follow-up recommended. Percutaneous nephrostomy tube in the left kidney. No urinary calculi or hydronephrosis. Irregular asymmetric left wall thickening about the nondistended bladder. 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 Herniation of a nonobstructed loop of small bowel into the parastomal hernia. No bowel wall thickening or obstruction. Stomach is within normal limits. Lymphatic: No lymphadenopathy. Reproductive: No acute abnormality.  Penile prosthesis. Other: The presacral peripherally enhancing fluid collection has slightly increased in size compared to 09/06/2023 now measuring 4.7 x 5.5 cm in the axial plane, previously 3.7 x 5.5 cm. Extension of the fluid collection into the left ischial  anal fossa. Mild adjacent stranding. No free intraperitoneal air. Musculoskeletal: No acute fracture. The presacral fluid collection abuts the anterior cortex of the sacrum which demonstrates mild irregularity suspicious for osteomyelitis. 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 Review of the MIP images confirms the above findings. IMPRESSION: 1. No acute aortic syndrome. 2. The presacral fluid collection has slightly increased in size compared to 09/06/2023. Extension of the fluid collection into the left ischioanal fossa. The presacral fluid collection abuts the anterior cortex of the sacrum which demonstrates mild irregularity suspicious for osteomyelitis. 3. Irregular asymmetric left bladder wall thickening similar to prior. Cystitis versus malignancy. 4. Cholelithiasis. 5. Stable 7 mm right lower lobe nodule. Electronically Signed   By: Rozell Cornet M.D.   On: 11/09/2023 00:56   DG Chest 2 View Result Date: 11/08/2023 CLINICAL DATA:  Shortness of breath and chest pain EXAM: CHEST - 2 VIEW COMPARISON:  Radiograph 07/07/2023 FINDINGS: Stable cardiomediastinal silhouette. Calcified tortuous aorta. Low lung volumes accentuate pulmonary vascularity. Chronic bronchitic changes. Left basilar atelectasis. No pleural effusion or pneumothorax. Right subclavian vascular stent. IMPRESSION: Chronic bronchitic changes.  Left basilar atelectasis/scarring. Electronically Signed   By: Rozell Cornet M.D.   On: 11/08/2023 23:06  Assessment and Plan:   Chest Pain  Elevated Troponin Patient presented with sudden onset of chest pressure that he describes as "400 lbs sitting on his chest." EKG showed no acute ischemic changes. High-sensitivity troponin was 428 >> 374. CTA showed no acute aortic syndrome. Prior LHC in 07/2022 showed normal coronaries.  - Pain improved with Fentanyl  and Dilaudid  in the ED but returned when these medications worse off. He currently reports 7/10 pain but appears  comfortably. Current pain sounds atypical - he states he can feel it when he is talking and does state it is a little worse with deep inspiration. He also has some similar pain with palpitation of chest pain.  - Will order Echo. - Continue IV Heparin  for now. - He already received Aspirin  325mg  today. Can continue Aspirin  81mg  daily starting tomorrow for now (at least until Echo is done). - Will start Lipitor 20mg  daily. LDL was 23 in 2023. Will repeat fasting lipid panel in the morning. - I have a low suspicion for true ACS. Unlikely that patient would go from clear coronaries on cardiac catheterization last year to now obstructive CAD. Will get Echo as above and can decide on whether any additional work-up is necessary after that.   Persistent Atrial Fibrillation Maintaining sinus rhythm. - Not on any AV nodal agents due to hypotension requiring Midodrine .  - On chronic anticoagulation with Eliquis  2.5mg  twice daily at home. However, this is currently on hold and patient on IV Heparin  instead.   Hypotension Patient has history of hypotension, especially on dialysis dialysis.  - BP stable.  - Continue Midodrine  10mmg three times daily.   ESRD On dialysis M/ W/ F. Last session was 11/06/2023. He was supposed to have dialysis today but has not had this yet.  - Management per Nephrology.  Otherwise, per primary team: - Recent groin abscess with extension of prescral fluid collection (concerning for possible sacral osteomyelitis) - Recent small bowel obstruction  - Obstructive uropathy s/p chronic left percutenaous nephrostomy - Ulcerative colitis s/p colostomy  - Chronic macrocytic anemia - GERD - Asthma  Risk Assessment/Risk Scores:   TIMI Risk Score for Unstable Angina or Non-ST Elevation MI:   The patient's TIMI risk score is 4, which indicates a 20% risk of all cause mortality, new or recurrent myocardial infarction or need for urgent revascularization in the next 14  days.{   CHA2DS2-VASc Score = 5  This indicates a 7.2% annual risk of stroke. The patient's score is based upon: CHF History: 0 HTN History: 0 Diabetes History: 0 Stroke History: 2 Vascular Disease History: 1 Age Score: 2 Gender Score: 0    For questions or updates, please contact [facility] Please consult [email or web address] for contact info under    Signed, Callie E Goodrich, PA-C  11/09/2023 5:36 PM  History and all data above reviewed.  Patient examined.  I agree with the findings as above.  The patient presents for evaluation of chest pain that began early last night .  He reported that this was a heavy severe 8 out of 10 discomfort.  It was worse in certain positions worse with deep breathing.  There is no radiation to his jaw or to his arms.  He was not having acute shortness of breath.  He did not have any nausea vomiting or diaphoresis.  He presented to the emergency room where he was not thought to have any acute ST segment changes though he does probably have some subtle ST elevation though  difficult to assess but looks slightly different than previous EKGs.  His troponin was elevated.  He has previously had the workup above with normal coronaries and I did review this catheterization and the images for this consultation.  He otherwise has not had any recent change in his health.  He has chronic dyspnea multiple medical problems as above.  He gets around in his house but has had significant fatigue without clear etiology.  The patient exam reveals COR:RRR, +3 component friction rub,  Lungs: Clear to auscultation bilaterally,  Abd: Positive bowel sounds normal frequency and pitch, Ext no edema.  All available labs, radiology testing, previous records reviewed. Agree with documented assessment and plan.  Chest pain: His pain is consistent with acute pericarditis and he does have a rub.  We will check an echocardiogram I am going too go ahead and discontinue his IV heparin  with a  small risk of hemorrhagic conversion if he does have true pericarditis.  And it is unlikely that he is having an acute coronary syndrome.  This does not sound like a pulmonary embolism.   And treat him with high-dose ibuprofen.  I can use colchicine with his end-stage renal disease and dialysis has not cleared.   Chronic atrial fibrillation: He is rate controlled.  I think he does need to be on a low-dose of the Eliquis  despite the ibuprofen.  I probably would try to use a somewhat limited course of the higher dose ibuprofen may be 10 days.  Certainly there is a risk of bleeding and I will have this conversation further with him.   Eilleen Grates  6:21 PM  11/09/2023

## 2023-11-09 NOTE — ED Notes (Signed)
 Nephrostomy tube assessed. Site looks WNL and no signs of infection.

## 2023-11-09 NOTE — ED Notes (Signed)
 [facility] at bedside

## 2023-11-10 ENCOUNTER — Encounter (HOSPITAL_COMMUNITY): Payer: Self-pay | Admitting: Internal Medicine

## 2023-11-10 ENCOUNTER — Inpatient Hospital Stay (HOSPITAL_COMMUNITY)

## 2023-11-10 DIAGNOSIS — R079 Chest pain, unspecified: Secondary | ICD-10-CM | POA: Diagnosis not present

## 2023-11-10 DIAGNOSIS — I309 Acute pericarditis, unspecified: Secondary | ICD-10-CM | POA: Diagnosis not present

## 2023-11-10 DIAGNOSIS — I4819 Other persistent atrial fibrillation: Secondary | ICD-10-CM | POA: Diagnosis not present

## 2023-11-10 LAB — HEPATITIS B SURFACE ANTIGEN: Hepatitis B Surface Ag: NONREACTIVE

## 2023-11-10 LAB — RENAL FUNCTION PANEL
Albumin: 2.9 g/dL — ABNORMAL LOW (ref 3.5–5.0)
Anion gap: 19 — ABNORMAL HIGH (ref 5–15)
BUN: 95 mg/dL — ABNORMAL HIGH (ref 8–23)
CO2: 19 mmol/L — ABNORMAL LOW (ref 22–32)
Calcium: 9.1 mg/dL (ref 8.9–10.3)
Chloride: 93 mmol/L — ABNORMAL LOW (ref 98–111)
Creatinine, Ser: 11.83 mg/dL — ABNORMAL HIGH (ref 0.61–1.24)
GFR, Estimated: 4 mL/min — ABNORMAL LOW (ref >60)
Glucose, Bld: 102 mg/dL — ABNORMAL HIGH (ref 70–99)
Phosphorus: 7.1 mg/dL — ABNORMAL HIGH (ref 2.5–4.6)
Potassium: 4.2 mmol/L (ref 3.5–5.1)
Sodium: 131 mmol/L — ABNORMAL LOW (ref 135–145)

## 2023-11-10 LAB — C DIFFICILE QUICK SCREEN W PCR REFLEX
C Diff antigen: NEGATIVE
C Diff interpretation: NOT DETECTED
C Diff toxin: NEGATIVE

## 2023-11-10 LAB — ECHOCARDIOGRAM COMPLETE
AR max vel: 2.92 cm2
AV Area VTI: 2.72 cm2
AV Area mean vel: 2.81 cm2
AV Mean grad: 4 mmHg
AV Peak grad: 8.5 mmHg
Ao pk vel: 1.46 m/s
Area-P 1/2: 2.84 cm2
Calc EF: 62 %
Height: 68 in
MV VTI: 1.95 cm2
S' Lateral: 3 cm
Single Plane A2C EF: 67.8 %
Single Plane A4C EF: 59.6 %
Weight: 2426.82 [oz_av]

## 2023-11-10 LAB — CBC
HCT: 27.2 % — ABNORMAL LOW (ref 39.0–52.0)
Hemoglobin: 9.2 g/dL — ABNORMAL LOW (ref 13.0–17.0)
MCH: 39 pg — ABNORMAL HIGH (ref 26.0–34.0)
MCHC: 33.8 g/dL (ref 30.0–36.0)
MCV: 115.3 fL — ABNORMAL HIGH (ref 80.0–100.0)
Platelets: 226 10*3/uL (ref 150–400)
RBC: 2.36 MIL/uL — ABNORMAL LOW (ref 4.22–5.81)
RDW: 15.7 % — ABNORMAL HIGH (ref 11.5–15.5)
WBC: 13.2 10*3/uL — ABNORMAL HIGH (ref 4.0–10.5)
nRBC: 0 % (ref 0.0–0.2)

## 2023-11-10 LAB — LIPID PANEL
Cholesterol: 76 mg/dL (ref 0–200)
HDL: 25 mg/dL — ABNORMAL LOW (ref >40)
LDL Cholesterol: 15 mg/dL (ref 0–99)
Total CHOL/HDL Ratio: 3 ratio
Triglycerides: 180 mg/dL — ABNORMAL HIGH (ref <150)
VLDL: 36 mg/dL (ref 0–40)

## 2023-11-10 MED ORDER — LIDOCAINE HCL (PF) 1 % IJ SOLN: 5.0000 mL | INTRAMUSCULAR | Status: DC | PRN

## 2023-11-10 MED ORDER — ALBUMIN HUMAN 25 % IV SOLN
25.0000 g | Freq: Once | INTRAVENOUS | Status: AC
  Administered 2023-11-10: 25 g via INTRAVENOUS
  Filled 2023-11-10: qty 100

## 2023-11-10 MED ORDER — PROSOURCE PLUS PO LIQD
30.0000 mL | Freq: Three times a day (TID) | ORAL | Status: DC
  Administered 2023-11-10 – 2023-11-13 (×7): 30 mL via ORAL
  Filled 2023-11-10 (×8): qty 30

## 2023-11-10 MED ORDER — GABAPENTIN 100 MG PO CAPS
200.0000 mg | ORAL_CAPSULE | ORAL | Status: AC
  Administered 2023-11-10: 200 mg via ORAL
  Filled 2023-11-10: qty 2

## 2023-11-10 MED ORDER — PANTOPRAZOLE SODIUM 40 MG PO TBEC
40.0000 mg | DELAYED_RELEASE_TABLET | Freq: Two times a day (BID) | ORAL | Status: DC
  Administered 2023-11-10 – 2023-11-13 (×7): 40 mg via ORAL
  Filled 2023-11-10 (×7): qty 1

## 2023-11-10 MED ORDER — HEPARIN (PORCINE) 25000 UT/250ML-% IV SOLN
1050.0000 [IU]/h | INTRAVENOUS | Status: DC
  Administered 2023-11-10: 850 [IU]/h via INTRAVENOUS
  Filled 2023-11-10: qty 250

## 2023-11-10 MED ORDER — MIDODRINE HCL 5 MG PO TABS
ORAL_TABLET | ORAL | Status: AC
  Filled 2023-11-10: qty 1

## 2023-11-10 MED ORDER — DORZOLAMIDE HCL 2 % OP SOLN
1.0000 [drp] | Freq: Every day | OPHTHALMIC | Status: DC
  Administered 2023-11-10 – 2023-11-12 (×3): 1 [drp] via OPHTHALMIC
  Filled 2023-11-10: qty 10

## 2023-11-10 MED ORDER — CHLORHEXIDINE GLUCONATE CLOTH 2 % EX PADS
6.0000 | MEDICATED_PAD | Freq: Every day | CUTANEOUS | Status: DC
  Administered 2023-11-10 – 2023-11-13 (×4): 6 via TOPICAL

## 2023-11-10 MED ORDER — DIPHENHYDRAMINE HCL 25 MG PO CAPS
25.0000 mg | ORAL_CAPSULE | Freq: Once | ORAL | Status: AC
  Administered 2023-11-10: 25 mg via ORAL
  Filled 2023-11-10: qty 1

## 2023-11-10 MED ORDER — PERFLUTREN LIPID MICROSPHERE
1.0000 mL | INTRAVENOUS | Status: AC | PRN
  Administered 2023-11-10: 2 mL via INTRAVENOUS

## 2023-11-10 MED ORDER — MIDODRINE HCL 5 MG PO TABS
ORAL_TABLET | ORAL | Status: AC
  Filled 2023-11-10: qty 2

## 2023-11-10 MED ORDER — POLYETHYLENE GLYCOL 3350 17 G PO PACK: 17.0000 g | PACK | Freq: Every day | ORAL | Status: DC | PRN

## 2023-11-10 MED ORDER — MIDODRINE HCL 5 MG PO TABS
15.0000 mg | ORAL_TABLET | Freq: Three times a day (TID) | ORAL | Status: DC
  Administered 2023-11-10 – 2023-11-13 (×13): 15 mg via ORAL
  Filled 2023-11-10 (×12): qty 3

## 2023-11-10 MED ORDER — VANCOMYCIN HCL 750 MG/150ML IV SOLN
750.0000 mg | INTRAVENOUS | Status: AC
  Administered 2023-11-10: 750 mg via INTRAVENOUS
  Filled 2023-11-10: qty 150

## 2023-11-10 MED ORDER — DARBEPOETIN ALFA 60 MCG/0.3ML IJ SOSY
60.0000 ug | PREFILLED_SYRINGE | INTRAMUSCULAR | Status: DC
  Filled 2023-11-10: qty 0.3

## 2023-11-10 MED ORDER — TIMOLOL MALEATE 0.5 % OP SOLN
1.0000 [drp] | Freq: Two times a day (BID) | OPHTHALMIC | Status: DC
  Administered 2023-11-10 – 2023-11-13 (×7): 1 [drp] via OPHTHALMIC
  Filled 2023-11-10: qty 5

## 2023-11-10 MED ORDER — SEVELAMER CARBONATE 0.8 G PO PACK
1.6000 g | PACK | Freq: Three times a day (TID) | ORAL | Status: DC
  Administered 2023-11-11 – 2023-11-13 (×6): 1.6 g via ORAL
  Filled 2023-11-10 (×12): qty 2

## 2023-11-10 MED ORDER — CALCITRIOL 0.5 MCG PO CAPS
0.5000 ug | ORAL_CAPSULE | ORAL | Status: DC
  Administered 2023-11-12: 0.5 ug via ORAL
  Filled 2023-11-10: qty 1

## 2023-11-10 NOTE — Consult Note (Addendum)
 [facility] Renal Consultation Note    Indication for Consultation:  Management of ESRD/hemodialysis; anemia, hypertension/volume and secondary hyperparathyroidism  [id number], Saverio Curling, MD  HPI: Joshua Riley is a 88 y.o. male with ESRD on HD MWF at [facility]. 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 Noted he was admitted two months ago for abdominal pain found to have a small bowel obstruction.  Patient presented to [facility] ED yesterday c/o chest pain. He was then transferred to [facility] for further evaluation. Seen and examined patient at bedside. He reports the chest pain started 2 days ago and described it as chest pressure. He denied SOB, dizziness, or N/V when the chest pain occurred. Also denied pain radiation. Currently, he reports his chest pain is starting to improve. Cardiology is on board and an ECHO was ordered. General surgery also consulted for recurrent presacral fluid collection. Current renal labs are stable and noted troponin of 374. His last HD was on 5/2 and it appears he's been leaving under his EDW. He is frail and on RA. He is on Midodrine  for chronic hypotension. He has a functional L thigh AVG. Plan for HD today off schedule. Plan to transition him back to MWF schedule later this week.   Past Medical History:  Diagnosis Date   A-fib (HCC)    Anemia    Arthritis    Cancer (HCC)    Basal cell   COVID-19    2021   Dysrhythmia    Afib-controlled on eliquis    ESRD (end stage renal disease) (HCC) 10/22/2021   Glaucoma 11/18/2021   History of DVT (deep vein thrombosis)    Hydronephrosis    managed wtih a PCN    Idiopathic neuropathy 10/22/2021   lyrica     Ileostomy in place [facility])    Obstructive uropathy    With chronic left nephrostomy   Old retinal detachment, total or subtotal    Orthostatic hypotension 10/22/2021   Sleep apnea    does not need a machine   Stroke (HCC)    Ulcerative colitis (HCC)    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   Past Surgical History:  Procedure Laterality Date   BASAL CELL CARCINOMA EXCISION     10/23   COLON SURGERY     creation of j pouch     and subsequent takedown of j pouch   EYE SURGERY     IR NEPHROSTOMY EXCHANGE LEFT  12/10/2021   IR NEPHROSTOMY EXCHANGE LEFT  04/22/2022   IR NEPHROSTOMY EXCHANGE LEFT  07/29/2022   IR NEPHROSTOMY EXCHANGE LEFT  10/28/2022   IR NEPHROSTOMY EXCHANGE LEFT  02/05/2023   IR NEPHROSTOMY EXCHANGE LEFT  05/07/2023   IR NEPHROSTOMY EXCHANGE LEFT  06/25/2023   IR NEPHROSTOMY EXCHANGE LEFT  07/17/2023   IR NEPHROSTOMY EXCHANGE LEFT  10/22/2023   LEFT HEART CATH AND CORONARY ANGIOGRAPHY N/A 07/22/2022   Procedure: LEFT HEART CATH AND CORONARY ANGIOGRAPHY;  Surgeon: Swaziland, Peter M, MD;  Location: [facility] INVASIVE CV LAB;  Service: Cardiovascular;  Laterality: N/A;   REVISION OF ARTERIOVENOUS GORETEX GRAFT Left 05/06/2022   Procedure: REDO LEFT THIGH ARTERIOVENOUS 4-7 MM GORETEX GRAFT;  Surgeon: Dannis Dy, MD;  Location: [facility] OR;  Service:  Vascular;  Laterality: Left;   SMALL INTESTINE SURGERY     TOTAL COLECTOMY     Family History  Problem Relation Age of Onset   Stroke Mother    Cancer Father    Esophageal cancer Brother    Social History:  reports that he quit smoking about 40 years ago. His smoking use included cigarettes. He started smoking about 46 years ago. He has a 12 pack-year smoking history. He has never been exposed to tobacco smoke. He has never used smokeless tobacco. He reports current alcohol use of about 5.0 standard drinks of alcohol per week. He reports that he does not use  drugs. Allergies  Allergen Reactions   Cephalosporins Rash   Ciprofloxacin Itching and Rash   Baclofen Other (See Comments)    Altered mental status, after accidental overdose     Prior to Admission medications   Medication Sig Start Date End Date Taking? Authorizing Provider  albuterol  (PROVENTIL ) (2.5 MG/3ML) 0.083% nebulizer solution Take 2.5 mg by nebulization every 6 (six) hours as needed for wheezing or shortness of breath.   Yes [provider]  albuterol  (VENTOLIN  HFA) 108 (90 Base) MCG/ACT inhaler Inhale 1-2 puffs into the lungs every 6 (six) hours as needed for wheezing or shortness of breath.   Yes [provider]  budesonide  (PULMICORT ) 0.5 MG/2ML nebulizer solution Take 0.5 mg by nebulization 2 (two) times daily as needed (for respiratory flares).   Yes [provider]  Cyanocobalamin (VITAMIN B-12 PO) Take 1 capsule by mouth in the morning and at bedtime.   Yes [provider]  dorzolamide  (TRUSOPT ) 2 % ophthalmic solution Place 1 drop into the right eye at bedtime. 06/17/23  Yes [provider]  ELIQUIS  2.5 MG TABS tablet Take 1 tablet (2.5 mg total) by mouth 2 (two) times daily. 08/07/22  Yes Sonny Dust, MD  folic acid  (FOLVITE ) 1 MG tablet Take 1 tablet (1 mg total) by mouth daily. 07/21/23  Yes Regalado, Belkys A, MD  gabapentin (NEURONTIN) 300 MG capsule Take 300 mg by mouth at bedtime. 09/09/23  Yes [provider]  midodrine  (PROAMATINE ) 10 MG tablet Take 1 tablet (10 mg total) by mouth daily. Patient taking differently: Take 10 mg by mouth 3 (three) times daily. 09/24/23  Yes Swinyer, Leilani Punter, NP  mometasone -formoterol  (DULERA ) 200-5 MCG/ACT AERO Inhale 2 puffs into the lungs 2 (two) times daily. 09/08/23  Yes Doroteo Gasmen, MD  Omega Fatty Acids-Vitamins (OMEGA-3 GUMMIES) CHEW Chew 1 tablet by mouth in the morning and at bedtime.   Yes [provider]  pantoprazole  (PROTONIX ) 40 MG tablet Take 1  tablet (40 mg total) by mouth 2 (two) times daily. Patient taking differently: Take 40 mg by mouth 2 (two) times daily as needed (for heartburn or reflux). 07/21/23  Yes Regalado, Belkys A, MD  ramelteon  (ROZEREM ) 8 MG tablet Take 1 tablet (8 mg total) by mouth at bedtime. Patient taking differently: Take 8 mg by mouth at bedtime as needed for sleep. 10/27/23  Yes Almira Jaeger, MD  sertraline  (ZOLOFT ) 50 MG tablet Take 25 mg by mouth at bedtime.   Yes [provider]  sevelamer  carbonate (RENVELA ) 800 MG tablet Take 1,600 mg by mouth 3 (three) times daily with meals.   Yes [provider]  timolol  (TIMOPTIC ) 0.5 % ophthalmic solution Place 1 drop into both eyes in the morning and at bedtime.   Yes [provider]  traMADol  (ULTRAM ) 50 MG tablet Take  50 mg by mouth every 8 (eight) hours as needed (for pain).   Yes [provider]  TYLENOL  325 MG tablet Take 325-650 mg by mouth every 6 (six) hours as needed for mild pain (pain score 1-3) or headache.   Yes [provider]   Current Facility-Administered Medications  Medication Dose Route Frequency Provider Last Rate Last Admin   acetaminophen  (TYLENOL ) tablet 650 mg  650 mg Oral Q4H PRN Lavanda Porter, MD   650 mg at 11/10/23 0019   albuterol  (PROVENTIL ) (2.5 MG/3ML) 0.083% nebulizer solution 2.5 mg  2.5 mg Nebulization Q6H PRN Lavanda Porter, MD       apixaban  (ELIQUIS ) tablet 2.5 mg  2.5 mg Oral BID Eilleen Grates, MD   2.5 mg at 11/10/23 1014   atorvastatin (LIPITOR) tablet 20 mg  20 mg Oral Daily Goodrich, Callie E, PA-C   20 mg at 11/10/23 1014   Chlorhexidine  Gluconate Cloth 2 % PADS 6 each  6 each Topical Q0600 Baron Border, MD   6 each at 11/10/23 1013   dorzolamide  (TRUSOPT ) 2 % ophthalmic solution 1 drop  1 drop Right Eye QHS Regalado, Belkys A, MD       ibuprofen (ADVIL) tablet 800 mg  800 mg Oral TID Hochrein, James, MD   800 mg at 11/10/23 1014   lidocaine  (PF) (XYLOCAINE ) 1 %  injection 5 mL  5 mL Intradermal PRN Baron Border, MD       meropenem  (MERREM ) 1 g in sodium chloride  0.9 % 100 mL IVPB  1 g Intravenous Q24H Hall, Carole N, DO       midodrine  (PROAMATINE ) tablet 15 mg  15 mg Oral TID Reesa Cannon N, DO   15 mg at 11/10/23 1116   nitroGLYCERIN (NITROSTAT) SL tablet 0.4 mg  0.4 mg Sublingual Q5 min PRN Rosealee Concha, MD       ondansetron  (ZOFRAN ) injection 4 mg  4 mg Intravenous Q6H PRN Patel, Ekta V, MD       Oral care mouth rinse  15 mL Mouth Rinse PRN Lavanda Porter, MD       pantoprazole  (PROTONIX ) EC tablet 40 mg  40 mg Oral BID Regalado, Belkys A, MD   40 mg at 11/10/23 1117   perflutren lipid microspheres (DEFINITY) IV suspension  1-10 mL Intravenous PRN Goodrich, Callie E, PA-C   2 mL at 11/10/23 1154   polyethylene glycol (MIRALAX / GLYCOLAX) packet 17 g  17 g Oral Daily PRN Reesa Cannon N, DO       thiamine (VITAMIN B1) injection 100 mg  100 mg Intravenous Daily Brunilda Capra V, MD   100 mg at 11/10/23 1013   timolol  (TIMOPTIC ) 0.5 % ophthalmic solution 1 drop  1 drop Both Eyes BID Regalado, Belkys A, MD   1 drop at 11/10/23 1013   [START ON 11/11/2023] vancomycin  (VANCOREADY) IVPB 750 mg/150 mL  750 mg Intravenous Q M,W,F-HD Lavanda Porter, MD       Labs: Basic Metabolic Panel: Recent Labs  Lab 11/08/23 2240 11/09/23 1804  NA 136 132*  K 3.7 4.4  CL 92* 94*  CO2 25 20*  GLUCOSE 167* 137*  BUN 67* 78*  CREATININE 10.10* 11.24*  CALCIUM 10.2 8.9   Liver Function Tests: Recent Labs  Lab 11/09/23 1804  AST 16  ALT 32  ALKPHOS 77  BILITOT 1.1  PROT 6.5  ALBUMIN  2.6*   No results for input(s): "LIPASE", "AMYLASE" in the last  168 hours. No results for input(s): "AMMONIA" in the last 168 hours. CBC: Recent Labs  Lab 11/08/23 2240 11/10/23 0347  WBC 15.8* 13.2*  HGB 9.8* 9.2*  HCT 29.0* 27.2*  MCV 116.5* 115.3*  PLT 197 226   Cardiac Enzymes: No results for input(s): "CKTOTAL", "CKMB", "CKMBINDEX", "TROPONINI" in the last 168  hours. CBG: No results for input(s): "GLUCAP" in the last 168 hours. Iron Studies: No results for input(s): "IRON", "TIBC", "TRANSFERRIN", "FERRITIN" in the last 72 hours. Studies/Results: US  Abdomen Limited RUQ (LIVER/GB) Result Date: 11/09/2023 CLINICAL DATA:  [id number] Abdominal pain [id number] Cholelithiases 92642 EXAM: ULTRASOUND ABDOMEN LIMITED RIGHT UPPER QUADRANT COMPARISON:  None Available. FINDINGS: Gallbladder: Gallbladder wall thickened. Shadowing stone at the fundus. No pericholecystic fluid. Common bile duct: Diameter: 0.7 cm. Liver: Liver is hyperechoic consistent with fatty infiltration. No focal hepatic lesions identified. No intrahepatic ductal dilatation. Hepatopetal portal vein flow. IMPRESSION: 1. Cholelithiasis. Gallbladder wall thickening. 2. Fatty infiltration of the liver. Electronically Signed   By: Sydell Eva M.D.   On: 11/09/2023 22:15   CT Angio Chest/Abd/Pel for Dissection W and/or Wo Contrast Result Date: 11/09/2023 CLINICAL DATA:  Acute aortic syndrome suspected. Chest pain and shortness of breath. EXAM: CT ANGIOGRAPHY CHEST, ABDOMEN AND PELVIS TECHNIQUE: Non-contrast CT of the chest was initially obtained. Multidetector CT imaging through the chest, abdomen and pelvis was performed using the standard protocol during bolus administration of intravenous contrast. Multiplanar reconstructed images and MIPs were obtained and reviewed to evaluate the vascular anatomy. RADIATION DOSE REDUCTION: This exam was performed according to the departmental dose-optimization program which includes automated exposure control, adjustment of the mA and/or kV according to patient size and/or use of iterative reconstruction technique. CONTRAST:  OMNIPAQUE  IOHEXOL  350 MG/ML SOLN COMPARISON:  Chest radiograph 11/08/2023; CT abdomen pelvis 09/06/2023; CT chest 08/18/2023 FINDINGS: CTA CHEST FINDINGS Cardiovascular: No intramural hematoma, penetrating atherosclerotic ulcer, or dissection.  Normal caliber thoracic aorta. Aortic and coronary artery atherosclerotic calcification. Right subclavian vein stent. Mediastinum/Nodes: Trachea is patent. Unremarkable thyroid and esophagus. No lymphadenopathy. Lungs/Pleura: Lower lobe atelectasis and scarring. Otherwise no focal consolidation. No pleural effusion or pneumothorax. 7 mm right lower lobe nodule on series 7/image 103 is unchanged. Musculoskeletal: No acute fracture. Review of the MIP images confirms the above findings. CTA ABDOMEN AND PELVIS FINDINGS VASCULAR Aorta: Calcified atherosclerotic plaque throughout the abdominal aorta without hemodynamically significant stenosis. No aneurysm or dissection. Celiac: Severe narrowing at the origin secondary to calcified atherosclerotic plaque. No aneurysm or dissection. SMA: Calcified plaque without hemodynamically significant stenosis. No aneurysm or dissection. Renals: Calcified plaque at the origin of the left renal artery. No hemodynamically significant stenosis. No aneurysm or dissection. IMA: Not visualized and likely occluded at the origin. Inflow: Redemonstrated surgical grafts arising from the left common femoral artery and vein extending inferiorly in the subcutaneous ventral left thigh. No hemodynamically significant stenosis. No aneurysm or dissection. Veins: No obvious acute venous abnormality within the limitations of this arterial phase study. Review of the MIP images confirms the above findings. NON-VASCULAR Hepatobiliary: Cholelithiasis. No evidence of acute cholecystitis. Unremarkable liver and biliary tree. Pancreas: Unremarkable. Spleen: Unremarkable. Adrenals/Urinary Tract: Normal adrenal glands. Atrophic native kidneys with multiple small hypoattenuating lesions statistically likely to represent cysts. No follow-up recommended. Percutaneous nephrostomy tube in the left kidney. No urinary calculi or hydronephrosis. Irregular asymmetric left wall thickening about the nondistended bladder.  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 Herniation of a nonobstructed loop of small bowel into the parastomal  hernia. No bowel wall thickening or obstruction. Stomach is within normal limits. Lymphatic: No lymphadenopathy. Reproductive: No acute abnormality.  Penile prosthesis. Other: The presacral peripherally enhancing fluid collection has slightly increased in size compared to 09/06/2023 now measuring 4.7 x 5.5 cm in the axial plane, previously 3.7 x 5.5 cm. Extension of the fluid collection into the left ischial anal fossa. Mild adjacent stranding. No free intraperitoneal air. Musculoskeletal: No acute fracture. The presacral fluid collection abuts the anterior cortex of the sacrum which demonstrates mild irregularity suspicious for osteomyelitis. 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 Review of the MIP images confirms the above findings. IMPRESSION: 1. No acute aortic syndrome. 2. The presacral fluid collection has slightly increased in size compared to 09/06/2023. Extension of the fluid collection into the left ischioanal fossa. The presacral fluid collection abuts the anterior cortex of the sacrum which demonstrates mild irregularity suspicious for osteomyelitis. 3. Irregular asymmetric left bladder wall thickening similar to prior. Cystitis versus malignancy. 4. Cholelithiasis. 5. Stable 7 mm right lower lobe nodule. Electronically Signed   By: Rozell Cornet M.D.   On: 11/09/2023 00:56   DG Chest 2 View Result Date: 11/08/2023 CLINICAL DATA:  Shortness of breath and chest pain EXAM: CHEST - 2 VIEW COMPARISON:  Radiograph 07/07/2023 FINDINGS: Stable cardiomediastinal silhouette. Calcified tortuous aorta. Low lung volumes accentuate pulmonary vascularity. Chronic bronchitic changes. Left basilar atelectasis. No pleural effusion or pneumothorax. Right subclavian vascular stent. IMPRESSION: Chronic bronchitic changes.  Left basilar  atelectasis/scarring. Electronically Signed   By: Rozell Cornet M.D.   On: 11/08/2023 23:06    ROS: All others negative except those listed in HPI.   Physical Exam: Vitals:   11/10/23 0400 11/10/23 0505 11/10/23 0743 11/10/23 1010  BP: (!) 95/55 106/79 (!) 89/59 (!) 82/61  Pulse:   75 63  Resp: 20 18 19 17   Temp: 97.6 F (36.4 C)  (!) 97.5 F (36.4 C) 97.7 F (36.5 C)  TempSrc: Oral  Oral Oral  SpO2: 93%  97%   Weight:      Height:         General: Elderly male; frail; NAD; on RA Head: Sclera not icteric  Lungs: Diminished bilateral lower lobes with some fine rales. No wheeze or rhonchi. Breathing is unlabored. Heart: RRR. No murmur, rubs or gallops.  Abdomen: soft and non-tender Lower extremities: No LE edema Neuro: AAOx3. Moves all extremities spontaneously. Dialysis Access: L thigh AVG (+) B/T  Dialysis Orders:  [facility]-MWF 2:45hrs EDW 71kg BFR 350, DFR Auto 1.5 2K/2.5Ca Heparin  No bolus ordered Mircera 120 mcg q4wks (new order). 50mcg last given on 10/26/23 Calcitriol  0.5mcg PO qHD - last dose 11/06/23 Midodrine  15mg  TID Renvela  Powder 1600mg  TID with meals  Last Labs: Hgb 9.2,  K 4.2, Ca 9.1, P 7.1,  Alb 2.9  Assessment/Plan: Chest pain - Cardiology following; ECHO ordered Recurrent presacral fluid collection - General surgery consulted ESRD -  on HD MWF. Last HD 5/2. Plan for HD today off schedule. Appears he's been leaving under EDW.  Chronic hypotension - continue Midodrine  15mg  TID Volume  - He's frail and appears he's been leaving under EDW at outpatient dialysis. Monitor closely. Will adjust EDW at discharge for accuracy Anemia of CKD - Hgb 9.2, resume ESA here Secondary Hyperparathyroidism -  Ca okay but phos is high. Resume binders and VDRA Nutrition - Renal diet with fluid restriction. Will start protein supplements for low Alb  Jadene Maxwell, NP [employer]  11/10/2023, 12:32 PM

## 2023-11-10 NOTE — Progress Notes (Signed)
 PHARMACY - ANTICOAGULATION CONSULT NOTE  Pharmacy Consult for Eliquis  > Heparin  Indication: chest pain/ACS, hx of Afib  Allergies  Allergen Reactions   Cephalosporins Rash   Ciprofloxacin Itching and Rash   Baclofen Other (See Comments)    Altered mental status, after accidental overdose     Vital Signs: Temp: 97.7 F (36.5 C) (05/06 1010) Temp Source: Oral (05/06 1010) BP: 82/61 (05/06 1010) Pulse Rate: 63 (05/06 1010)  Labs: Recent Labs    11/08/23 2240 11/09/23 1804 11/10/23 0347 11/10/23 1148  HGB 9.8*  --  9.2*  --   HCT 29.0*  --  27.2*  --   PLT 197  --  226  --   APTT  --  42*  --   --   HEPARINUNFRC  --  >1.10*  --   --   CREATININE 10.10* 11.24*  --  11.83*    Estimated Creatinine Clearance: 4.4 mL/min (A) (by C-G formula based on SCr of 11.83 mg/dL (H)).   Medical History: Past Medical History:  Diagnosis Date   A-fib (HCC)    Anemia    Arthritis    Cancer (HCC)    Basal cell   COVID-19    2021   Dysrhythmia    Afib-controlled on eliquis    ESRD (end stage renal disease) (HCC) 10/22/2021   Glaucoma 11/18/2021   History of DVT (deep vein thrombosis)    Hydronephrosis    managed wtih a PCN   Idiopathic neuropathy 10/22/2021   lyrica     Ileostomy in place [facility])    Obstructive uropathy    With chronic left nephrostomy   Old retinal detachment, total or subtotal    Orthostatic hypotension 10/22/2021   Sleep apnea    does not need a machine   Stroke (HCC)    Ulcerative colitis (HCC)    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  Pt is on Apixaban  PTA for history of atrial fibrillation. Holding apixaban  and starting heparin  with plans for drain placement, last dose of apixaban  per MD was 5/6 AM.    CBC stable - Hgb 9.2, pltc 226.   Goal of Therapy:  Heparin  level 0.3-0.7 units/ml aPTT 66-102 seconds Monitor platelets by anticoagulation protocol: Yes    Plan:  Start heparin  IV 850 units/hr at 2200 8h aPTT/heparin  level Daily CBC, heparin  level, and aPTT until correlating Monitor for bleeding F/u to DOAC pending drain  Cecillia Cogan, PharmD Clinical Pharmacist 11/10/2023  2:39 PM

## 2023-11-10 NOTE — Plan of Care (Signed)
  Problem: Health Behavior/Discharge Planning: Goal: Ability to manage health-related needs will improve Outcome: Progressing   Problem: Clinical Measurements: Goal: Will remain free from infection Outcome: Progressing Goal: Respiratory complications will improve Outcome: Progressing Goal: Cardiovascular complication will be avoided Outcome: Progressing   Problem: Activity: Goal: Risk for activity intolerance will decrease Outcome: Progressing   Problem: Nutrition: Goal: Adequate nutrition will be maintained Outcome: Progressing   Problem: Coping: Goal: Level of anxiety will decrease Outcome: Progressing   Problem: Elimination: Goal: Will not experience complications related to bowel motility Outcome: Progressing   Problem: Safety: Goal: Ability to remain free from injury will improve Outcome: Progressing   Problem: Skin Integrity: Goal: Risk for impaired skin integrity will decrease Outcome: Progressing

## 2023-11-10 NOTE — Progress Notes (Signed)
 Pharmacy Antibiotic Note  Joshua Riley is a 88 y.o. male admitted on 11/08/2023 with osteomyelitis.  Pharmacy has been consulted for vancomycin  dosing.  Patient is also on Merrem .  He has ESRD on MWF.  Patient received HD today 5/6 off schedule; tolerated.  Plan: Vanc 750mg  IV x 1 dose now - HD RN aware Continue Merrem  1gm IV Q24H Monitor HD schedule/tolerance, clinical progress, vanc level as indicated  Height: 5\' 8"  (172.7 cm) Weight: 70.5 kg (155 lb 6.8 oz) IBW/kg (Calculated) : 68.4  Temp (24hrs), Avg:98.1 F (36.7 C), Min:97.5 F (36.4 C), Max:99.2 F (37.3 C)  Recent Labs  Lab 11/08/23 2240 11/09/23 1804 11/10/23 0347 11/10/23 1148  WBC 15.8*  --  13.2*  --   CREATININE 10.10* 11.24*  --  11.83*    Estimated Creatinine Clearance: 4.4 mL/min (A) (by C-G formula based on SCr of 11.83 mg/dL (H)).    Allergies  Allergen Reactions   Cephalosporins Rash   Ciprofloxacin Itching and Rash   Baclofen Other (See Comments)    Altered mental status, after accidental overdose     Mero 5/5 >> Vanc 5/5 >>    5/5 BCx: ngtd  Joshua Riley D. Marikay Show, PharmD, BCPS, BCCCP 11/10/2023, 6:19 PM

## 2023-11-10 NOTE — Progress Notes (Signed)
   11/10/23 1830  Vitals  Temp 98.2 F (36.8 C)  Pulse Rate 73  Resp 15  BP 92/79  SpO2 94 %  O2 Device Room Air  Weight 70 kg  Type of Weight Post-Dialysis  Oxygen Therapy  Patient Activity (if Appropriate) In bed  Post Treatment  Dialyzer Clearance Clear  Hemodialysis Intake (mL) 0 mL  Liters Processed 58.4  Fluid Removed (mL) 500 mL  Tolerated HD Treatment Yes  Post-Hemodialysis Comments pt runs hypotensive during HD pt asymptomatic MD aware  AVG/AVF Arterial Site Held (minutes) 10 minutes  AVG/AVF Venous Site Held (minutes) 10 minutes   Received patient in bed to unit.  Alert and oriented.  Informed consent signed and in chart.   TX duration:2hrs  Patient tolerated well.  Transported back to the room  Alert, without acute distress.  Hand-off given to patient's nurse.   Access used: L thigh AVG Access issues: none  Total UF removed: 0.5L Medication(s) given: midodrine     [name]

## 2023-11-10 NOTE — Procedures (Signed)
 Chief Complaint: Patient was seen in consultation today for Pelvic fluid collection  at the request of Marlin Simmonds, PA-C  Referring Physician(s): Marlin Simmonds, PA-C  Supervising Physician: Elene Griffes  Patient Status: [facility] - In-pt  Full Code  History of Present Illness: Joshua Riley is a 88 y.o. male with history of a fib, ESRD, depression, obstructive uropathy-s/p left PCN placement, ulcerative colitis-s/p colectomy 2011 with ileostomy, anemia, DVT. Patient is known to IR due to chronic pelvic fluid collection, which drain was placed 07/13/23 by Dr. Marne Sings. Patient' drain unfortunately came out during PT 08/2023 and was not replaced at that time. Patient is currently admitted to [facility]  for pericarditis. Subsequent workup CT chest/abdomen/pelvis reported a presacral fluid collection, which has slight increased in size from 09/06/23. Extension of fluid collection into the left ischioanal fossa. The presacral fluid collection abuts the anterior cortex of the sacrum which demonstrated mild irregularity, suspicious for osteomyelitis.   Patient reports drainage from the area for the last 2 weeks. Denies: chest pain, shortness of breath, nausea, vomiting, diarrhea, abdominal pain, fever.     Past Medical History:  Diagnosis Date   A-fib (HCC)    Anemia    Arthritis    Cancer (HCC)    Basal cell   COVID-19    2021   Dysrhythmia    Afib-controlled on eliquis    ESRD (end stage renal disease) (HCC) 10/22/2021   Glaucoma 11/18/2021   History of DVT (deep vein thrombosis)    Hydronephrosis    managed wtih a PCN   Idiopathic neuropathy 10/22/2021   lyrica     Ileostomy in place [facility])    Obstructive uropathy    With chronic left nephrostomy   Old retinal detachment, total or subtotal    Orthostatic hypotension 10/22/2021   Sleep apnea    does not need a machine   Stroke (HCC)    Ulcerative colitis (HCC)    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 during  surgery    Past Surgical History:  Procedure Laterality Date   BASAL CELL CARCINOMA EXCISION     10/23   COLON SURGERY     creation of j pouch     and subsequent takedown of j pouch   EYE SURGERY     IR NEPHROSTOMY EXCHANGE LEFT  12/10/2021   IR NEPHROSTOMY EXCHANGE LEFT  04/22/2022   IR NEPHROSTOMY EXCHANGE LEFT  07/29/2022   IR NEPHROSTOMY EXCHANGE LEFT  10/28/2022   IR NEPHROSTOMY EXCHANGE LEFT  02/05/2023   IR NEPHROSTOMY EXCHANGE LEFT  05/07/2023   IR NEPHROSTOMY EXCHANGE LEFT  06/25/2023   IR NEPHROSTOMY EXCHANGE LEFT  07/17/2023   IR NEPHROSTOMY EXCHANGE LEFT  10/22/2023   LEFT HEART CATH AND CORONARY ANGIOGRAPHY N/A 07/22/2022   Procedure: LEFT HEART CATH AND CORONARY ANGIOGRAPHY;  Surgeon: Swaziland, Peter M, MD;  Location[facility] INVASIVE CV LAB;  Service: Cardiovascular;  Laterality: N/A;   REVISION OF ARTERIOVENOUS GORETEX GRAFT Left 05/06/2022   Procedure: REDO LEFT THIGH ARTERIOVENOUS 4-7 MM GORETEX GRAFT;  Surgeon: Dannis Dy, MD;  Location: [facility] OR;  Service: Vascular;  Laterality: Left;   SMALL INTESTINE SURGERY     TOTAL COLECTOMY      Allergies: Cephalosporins, Ciprofloxacin, and Baclofen  Medications: Prior to Admission medications   Medication Sig Start Date End Date Taking? Authorizing Provider  albuterol  (PROVENTIL ) (2.5 MG/3ML) 0.083% nebulizer solution Take 2.5 mg by nebulization every 6 (six) hours as needed for wheezing or shortness of breath.  Yes [provider]  albuterol  (VENTOLIN  HFA) 108 (90 Base) MCG/ACT inhaler Inhale 1-2 puffs into the lungs every 6 (six) hours as needed for wheezing or shortness of breath.   Yes [provider]  budesonide  (PULMICORT ) 0.5 MG/2ML nebulizer solution Take 0.5 mg by nebulization 2 (two) times daily as needed (for respiratory flares).   Yes [provider]  Cyanocobalamin (VITAMIN B-12 PO) Take 1 capsule by mouth in the morning and at bedtime.   Yes [provider]  dorzolamide   (TRUSOPT ) 2 % ophthalmic solution Place 1 drop into the right eye at bedtime. 06/17/23  Yes [provider]  ELIQUIS  2.5 MG TABS tablet Take 1 tablet (2.5 mg total) by mouth 2 (two) times daily. 08/07/22  Yes Sonny Dust, MD  folic acid  (FOLVITE ) 1 MG tablet Take 1 tablet (1 mg total) by mouth daily. 07/21/23  Yes Regalado, Belkys A, MD  gabapentin (NEURONTIN) 300 MG capsule Take 300 mg by mouth at bedtime. 09/09/23  Yes [provider]  midodrine  (PROAMATINE ) 10 MG tablet Take 1 tablet (10 mg total) by mouth daily. Patient taking differently: Take 10 mg by mouth 3 (three) times daily. 09/24/23  Yes Swinyer, Leilani Punter, NP  mometasone -formoterol  (DULERA ) 200-5 MCG/ACT AERO Inhale 2 puffs into the lungs 2 (two) times daily. 09/08/23  Yes Doroteo Gasmen, MD  Omega Fatty Acids-Vitamins (OMEGA-3 GUMMIES) CHEW Chew 1 tablet by mouth in the morning and at bedtime.   Yes [provider]  pantoprazole  (PROTONIX ) 40 MG tablet Take 1 tablet (40 mg total) by mouth 2 (two) times daily. Patient taking differently: Take 40 mg by mouth 2 (two) times daily as needed (for heartburn or reflux). 07/21/23  Yes Regalado, Belkys A, MD  ramelteon  (ROZEREM ) 8 MG tablet Take 1 tablet (8 mg total) by mouth at bedtime. Patient taking differently: Take 8 mg by mouth at bedtime as needed for sleep. 10/27/23  Yes Almira Jaeger, MD  sertraline  (ZOLOFT ) 50 MG tablet Take 25 mg by mouth at bedtime.   Yes [provider]  sevelamer  carbonate (RENVELA ) 800 MG tablet Take 1,600 mg by mouth 3 (three) times daily with meals.   Yes [provider]  timolol  (TIMOPTIC ) 0.5 % ophthalmic solution Place 1 drop into both eyes in the morning and at bedtime.   Yes [provider]  traMADol  (ULTRAM ) 50 MG tablet Take 50 mg by mouth every 8 (eight) hours as needed (for pain).   Yes [provider]  TYLENOL  325 MG tablet Take 325-650 mg by mouth every 6 (six) hours as needed for  mild pain (pain score 1-3) or headache.   Yes [provider]     Family History  Problem Relation Age of Onset   Stroke Mother    Cancer Father    Esophageal cancer Brother     Social History   Socioeconomic History   Marital status: Widowed    Spouse name: Not on file   Number of children: Not on file   Years of education: Not on file   Highest education level: 12th grade  Occupational History   Not on file  Tobacco Use   Smoking status: Former    Current packs/day: 0.00    Average packs/day: 2.0 packs/day for 6.0 years (12.0 ttl pk-yrs)    Types: Cigarettes    Start date: 21    Quit date: 69    Years since quitting: 40.3    Passive exposure: Never  Smokeless tobacco: Never  Vaping Use   Vaping status: Never Used  Substance and Sexual Activity   Alcohol use: Yes    Alcohol/week: 5.0 standard drinks of alcohol    Types: 5 Shots of liquor per week    Comment: socially   Drug use: Never   Sexual activity: Not Currently  Other Topics Concern   Not on file  Social History Narrative   Widowed- lost wife of 13 years to pancreatic cancer- previously married 43 years. Son in [place] and daughter helping in [place]. 4 grandkids.       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       Hobbies: dinner with family- occasional martini   Social Drivers of Health   Financial Resource Strain: Low Risk  (10/26/2023)   Overall Financial Resource Strain (CARDIA)    Difficulty of Paying Living Expenses: Not hard at all  Food Insecurity: No Food Insecurity (11/09/2023)   Hunger Vital Sign    Worried About Running Out of Food in the Last Year: Never true    Ran Out of Food in the Last Year: Never true  Transportation Needs: No Transportation Needs (11/09/2023)   PRAPARE - Administrator, Civil Service (Medical): No    Lack of Transportation (Non-Medical): No  Physical Activity: Insufficiently Active (10/26/2023)   Exercise Vital Sign    Days of  Exercise per Week: 1 day    Minutes of Exercise per Session: 10 min  Stress: Stress Concern Present (10/26/2023)   [employer] of Occupational Health - Occupational Stress Questionnaire    Feeling of Stress : To some extent  Social Connections: Moderately Integrated (11/09/2023)   Social Connection and Isolation Panel [NHANES]    Frequency of Communication with Friends and Family: More than three times a week    Frequency of Social Gatherings with Friends and Family: Twice a week    Attends Religious Services: More than 4 times per year    Active Member of [employer] or Organizations: Yes    Attends Banker Meetings: More than 4 times per year    Marital Status: Widowed     Review of Systems: A 12 point ROS discussed and pertinent positives are indicated in the HPI above.  All other systems are negative.  Review of Systems  Constitutional:  Negative for fever.  Respiratory:  Negative for cough and shortness of breath.   Cardiovascular:  Negative for chest pain.  Gastrointestinal:  Negative for abdominal pain, diarrhea, nausea and vomiting.    Vital Signs: BP (!) 82/61 (BP Location: Right Arm)   Pulse 63   Temp 97.7 F (36.5 C) (Oral)   Resp 17   Ht 5\' 8"  (1.727 m)   Wt 151 lb 10.8 oz (68.8 kg)   SpO2 97%   BMI 23.06 kg/m   Advance Care Plan: The advanced care plan/surrogate decision maker was discussed at the time of visit and documented in the medical record.    Physical Exam Cardiovascular:     Rate and Rhythm: Normal rate and regular rhythm.  Pulmonary:     Effort: Pulmonary effort is normal.     Breath sounds: Normal breath sounds.  Skin:    General: Skin is warm.  Neurological:     General: No focal deficit present.     Mental Status: He is alert and oriented to person, place, and time.  Psychiatric:        Mood and  Affect: Mood normal.     Imaging: ECHOCARDIOGRAM COMPLETE Result Date: 11/10/2023    ECHOCARDIOGRAM REPORT   Patient Name:    Joshua Riley Date of Exam: 11/10/2023 Medical Rec #:  [id number]       Height:       68.0 in Accession #:    [id number]      Weight:       151.7 lb Date of Birth:  [birth year]       BSA:          1.817 m Patient Age:    87 years        BP:           89/59 mmHg Patient Gender: M               HR:           58 bpm. Exam Location:  Inpatient Procedure: 2D Echo, Color Doppler and Cardiac Doppler (Both Spectral and Color            Flow Doppler were utilized during procedure). Indications:    R07.9* Chest pain, unspecified  History:        Patient has prior history of Echocardiogram examinations, most                 recent 05/27/2022.  Sonographer:    Andrena Bang Referring Phys: CALLIE E GOODRICH IMPRESSIONS  1. Left ventricular ejection fraction, by estimation, is 55 to 60%. The left ventricle has normal function. The left ventricle has no regional wall motion abnormalities. Left ventricular diastolic parameters are consistent with Grade I diastolic dysfunction (impaired relaxation).  2. Right ventricular systolic function is normal. The right ventricular size is moderately enlarged.  3. The mitral valve is normal in structure. No evidence of mitral valve regurgitation. No evidence of mitral stenosis.  4. Tricuspid valve regurgitation is moderate.  5. The aortic valve is calcified. There is mild calcification of the aortic valve. There is mild thickening of the aortic valve. Aortic valve regurgitation is mild. Aortic valve sclerosis is present, with no evidence of aortic valve stenosis. Aortic valve mean gradient measures 4.0 mmHg. Aortic valve Vmax measures 1.46 m/s.  6. Aorta measured 40 mm on CT chest 11/09/2023. Aortic dilatation noted. There is mild dilatation of the ascending aorta, measuring 40 mm.  7. The inferior vena cava is normal in size with greater than 50% respiratory variability, suggesting right atrial pressure of 3 mmHg. FINDINGS  Left Ventricle: Left ventricular ejection fraction, by estimation, is 55 to  60%. The left ventricle has normal function. The left ventricle has no regional wall motion abnormalities. Definity contrast agent was given IV to delineate the left ventricular  endocardial borders. The left ventricular internal cavity size was normal in size. There is no left ventricular hypertrophy. Left ventricular diastolic parameters are consistent with Grade I diastolic dysfunction (impaired relaxation). Right Ventricle: The right ventricular size is moderately enlarged. No increase in right ventricular wall thickness. Right ventricular systolic function is normal. Left Atrium: Left atrial size was normal in size. Right Atrium: Right atrial size was normal in size. Pericardium: There is no evidence of pericardial effusion. Mitral Valve: The mitral valve is normal in structure. No evidence of mitral valve regurgitation. No evidence of mitral valve stenosis. MV peak gradient, 5.7 mmHg. The mean mitral valve gradient is 3.0 mmHg. Tricuspid Valve: The tricuspid valve is normal in structure. Tricuspid valve regurgitation is moderate . No evidence of tricuspid stenosis. Aortic Valve: The  aortic valve is calcified. There is mild calcification of the aortic valve. There is mild thickening of the aortic valve. Aortic valve regurgitation is mild. Aortic valve sclerosis is present, with no evidence of aortic valve stenosis. Aortic valve mean gradient measures 4.0 mmHg. Aortic valve peak gradient measures 8.5 mmHg. Aortic valve area, by VTI measures 2.72 cm. Pulmonic Valve: The pulmonic valve was normal in structure. Pulmonic valve regurgitation is trivial. No evidence of pulmonic stenosis. Aorta: Aorta measured 40 mm on CT chest 11/09/2023. Aortic dilatation noted. There is mild dilatation of the ascending aorta, measuring 40 mm. Venous: The inferior vena cava is normal in size with greater than 50% respiratory variability, suggesting right atrial pressure of 3 mmHg. IAS/Shunts: No atrial level shunt detected by color  flow Doppler.  LEFT VENTRICLE PLAX 2D LVIDd:         4.60 cm      Diastology LVIDs:         3.00 cm      LV e' medial:    7.29 cm/s LV PW:         1.40 cm      LV E/e' medial:  16.7 LV IVS:        0.70 cm      LV e' lateral:   9.03 cm/s LVOT diam:     2.20 cm      LV E/e' lateral: 13.5 LV SV:         89 LV SV Index:   49 LVOT Area:     3.80 cm  LV Volumes (MOD) LV vol d, MOD A2C: 119.0 ml LV vol d, MOD A4C: 151.0 ml LV vol s, MOD A2C: 38.3 ml LV vol s, MOD A4C: 61.0 ml LV SV MOD A2C:     80.7 ml LV SV MOD A4C:     151.0 ml LV SV MOD BP:      85.0 ml RIGHT VENTRICLE RV S prime:     10.90 cm/s TAPSE (M-mode): 1.9 cm LEFT ATRIUM           Index LA Vol (A2C): 91.9 ml 50.58 ml/m LA Vol (A4C): 85.2 ml 46.89 ml/m  AORTIC VALVE AV Area (Vmax):    2.92 cm AV Area (Vmean):   2.81 cm AV Area (VTI):     2.72 cm AV Vmax:           146.00 cm/s AV Vmean:          91.700 cm/s AV VTI:            0.328 m AV Peak Grad:      8.5 mmHg AV Mean Grad:      4.0 mmHg LVOT Vmax:         112.00 cm/s LVOT Vmean:        67.700 cm/s LVOT VTI:          0.235 m LVOT/AV VTI ratio: 0.72  AORTA Ao Asc diam: 4.60 cm MITRAL VALVE MV Area (PHT): 2.84 cm     SHUNTS MV Area VTI:   1.95 cm     Systemic VTI:  0.24 m MV Peak grad:  5.7 mmHg     Systemic Diam: 2.20 cm MV Mean grad:  3.0 mmHg MV Vmax:       1.19 m/s MV Vmean:      75.2 cm/s MV Decel Time: 267 msec MV E velocity: 122.00 cm/s MV A velocity: 116.00 cm/s MV E/A ratio:  1.05 Dorothye Gathers MD Electronically signed  by Dorothye Gathers MD Signature Date/Time: 11/10/2023/1:22:42 PM    Final    US  Abdomen Limited RUQ (LIVER/GB) Result Date: 11/09/2023 CLINICAL DATA:  [id number] Abdominal pain [id number] Cholelithiases [id number] EXAM: ULTRASOUND ABDOMEN LIMITED RIGHT UPPER QUADRANT COMPARISON:  None Available. FINDINGS: Gallbladder: Gallbladder wall thickened. Shadowing stone at the fundus. No pericholecystic fluid. Common bile duct: Diameter: 0.7 cm. Liver: Liver is hyperechoic consistent with fatty  infiltration. No focal hepatic lesions identified. No intrahepatic ductal dilatation. Hepatopetal portal vein flow. IMPRESSION: 1. Cholelithiasis. Gallbladder wall thickening. 2. Fatty infiltration of the liver. Electronically Signed   By: Sydell Eva M.D.   On: 11/09/2023 22:15   CT Angio Chest/Abd/Pel for Dissection W and/or Wo Contrast Result Date: 11/09/2023 CLINICAL DATA:  Acute aortic syndrome suspected. Chest pain and shortness of breath. EXAM: CT ANGIOGRAPHY CHEST, ABDOMEN AND PELVIS TECHNIQUE: Non-contrast CT of the chest was initially obtained. Multidetector CT imaging through the chest, abdomen and pelvis was performed using the standard protocol during bolus administration of intravenous contrast. Multiplanar reconstructed images and MIPs were obtained and reviewed to evaluate the vascular anatomy. RADIATION DOSE REDUCTION: This exam was performed according to the departmental dose-optimization program which includes automated exposure control, adjustment of the mA and/or kV according to patient size and/or use of iterative reconstruction technique. CONTRAST:  OMNIPAQUE  IOHEXOL  350 MG/ML SOLN COMPARISON:  Chest radiograph 11/08/2023; CT abdomen pelvis 09/06/2023; CT chest 08/18/2023 FINDINGS: CTA CHEST FINDINGS Cardiovascular: No intramural hematoma, penetrating atherosclerotic ulcer, or dissection. Normal caliber thoracic aorta. Aortic and coronary artery atherosclerotic calcification. Right subclavian vein stent. Mediastinum/Nodes: Trachea is patent. Unremarkable thyroid and esophagus. No lymphadenopathy. Lungs/Pleura: Lower lobe atelectasis and scarring. Otherwise no focal consolidation. No pleural effusion or pneumothorax. 7 mm right lower lobe nodule on series 7/image 103 is unchanged. Musculoskeletal: No acute fracture. Review of the MIP images confirms the above findings. CTA ABDOMEN AND PELVIS FINDINGS VASCULAR Aorta: Calcified atherosclerotic plaque throughout the abdominal aorta  without hemodynamically significant stenosis. No aneurysm or dissection. Celiac: Severe narrowing at the origin secondary to calcified atherosclerotic plaque. No aneurysm or dissection. SMA: Calcified plaque without hemodynamically significant stenosis. No aneurysm or dissection. Renals: Calcified plaque at the origin of the left renal artery. No hemodynamically significant stenosis. No aneurysm or dissection. IMA: Not visualized and likely occluded at the origin. Inflow: Redemonstrated surgical grafts arising from the left common femoral artery and vein extending inferiorly in the subcutaneous ventral left thigh. No hemodynamically significant stenosis. No aneurysm or dissection. Veins: No obvious acute venous abnormality within the limitations of this arterial phase study. Review of the MIP images confirms the above findings. NON-VASCULAR Hepatobiliary: Cholelithiasis. No evidence of acute cholecystitis. Unremarkable liver and biliary tree. Pancreas: Unremarkable. Spleen: Unremarkable. Adrenals/Urinary Tract: Normal adrenal glands. Atrophic native kidneys with multiple small hypoattenuating lesions statistically likely to represent cysts. No follow-up recommended. Percutaneous nephrostomy tube in the left kidney. No urinary calculi or hydronephrosis. Irregular asymmetric left wall thickening about the nondistended bladder. 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 Herniation of a nonobstructed loop of small bowel into the parastomal hernia. No bowel wall thickening or obstruction. Stomach is within normal limits. Lymphatic: No lymphadenopathy. Reproductive: No acute abnormality.  Penile prosthesis. Other: The presacral peripherally enhancing fluid collection has slightly increased in size compared to 09/06/2023 now measuring 4.7 x 5.5 cm in the axial plane, previously 3.7 x 5.5 cm. Extension of the fluid collection into the left ischial anal fossa. Mild adjacent  stranding. No free intraperitoneal air.  Musculoskeletal: No acute fracture. The presacral fluid collection abuts the anterior cortex of the sacrum which demonstrates mild irregularity suspicious for osteomyelitis. 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 Review of the MIP images confirms the above findings. IMPRESSION: 1. No acute aortic syndrome. 2. The presacral fluid collection has slightly increased in size compared to 09/06/2023. Extension of the fluid collection into the left ischioanal fossa. The presacral fluid collection abuts the anterior cortex of the sacrum which demonstrates mild irregularity suspicious for osteomyelitis. 3. Irregular asymmetric left bladder wall thickening similar to prior. Cystitis versus malignancy. 4. Cholelithiasis. 5. Stable 7 mm right lower lobe nodule. Electronically Signed   By: Rozell Cornet M.D.   On: 11/09/2023 00:56   DG Chest 2 View Result Date: 11/08/2023 CLINICAL DATA:  Shortness of breath and chest pain EXAM: CHEST - 2 VIEW COMPARISON:  Radiograph 07/07/2023 FINDINGS: Stable cardiomediastinal silhouette. Calcified tortuous aorta. Low lung volumes accentuate pulmonary vascularity. Chronic bronchitic changes. Left basilar atelectasis. No pleural effusion or pneumothorax. Right subclavian vascular stent. IMPRESSION: Chronic bronchitic changes.  Left basilar atelectasis/scarring. Electronically Signed   By: Rozell Cornet M.D.   On: 11/08/2023 23:06   IR NEPHROSTOMY EXCHANGE LEFT Result Date: 10/22/2023 INDICATION: Left percutaneous nephrostomy tube requiring routine exchange. EXAM: LEFT PERCUTANEOUS NEPHROSTOMY TUBE EXCHANGE UNDER FLUOROSCOPY COMPARISON:  None Available. MEDICATIONS: None ANESTHESIA/SEDATION: None CONTRAST:  10 mL Omnipaque  300-administered into the collecting system(s) FLUOROSCOPY: Radiation Exposure Index (as provided by the fluoroscopic device): 16 mGy Kerma COMPLICATIONS: None immediate. PROCEDURE: Informed written consent  was obtained from the patient after a thorough discussion of the procedural risks, benefits and alternatives. All questions were addressed. Maximal Sterile Barrier Technique was utilized including caps, mask, sterile gowns, sterile gloves, sterile drape, hand hygiene and skin antiseptic. A timeout was performed prior to the initiation of the procedure. A pre-existing 10 French percutaneous nephrostomy tube was injected with contrast material under fluoroscopy. The catheter was cut and removed over a guidewire. A new 10 French nephrostomy tube was advanced over the guidewire and formed at the level of the renal pelvis. The catheter was flushed with saline and attached to a new gravity drainage bag. IMPRESSION: Exchange of 10 French left percutaneous nephrostomy tube under fluoroscopy. Electronically Signed   By: Erica Hau M.D.   On: 10/22/2023 12:53    Labs:  CBC: Recent Labs    09/07/23 0609 09/08/23 0804 11/08/23 2240 11/10/23 0347  WBC 7.2 9.7 15.8* 13.2*  HGB 11.2* 10.6* 9.8* 9.2*  HCT 33.4* 32.3* 29.0* 27.2*  PLT 188 222 197 226    COAGS: Recent Labs    07/13/23 0658 07/16/23 0630 07/16/23 1901 07/17/23 0324 09/07/23 0609 11/09/23 1804  INR 1.1  --   --   --   --   --   APTT 44*   < > 32 32 30 42*   < > = values in this interval not displayed.    BMP: Recent Labs    09/08/23 1948 11/08/23 2240 11/09/23 1804 11/10/23 1148  NA 136 136 132* 131*  K 3.5 3.7 4.4 4.2  CL 92* 92* 94* 93*  CO2 23 25 20* 19*  GLUCOSE 84 167* 137* 102*  BUN 49* 67* 78* 95*  CALCIUM 8.3* 10.2 8.9 9.1  CREATININE 6.90* 10.10* 11.24* 11.83*  GFRNONAA 7* 5* 4* 4*    LIVER FUNCTION TESTS: Recent Labs    07/22/23 0450 07/23/23 0344 09/06/23 1808 09/07/23 1610 09/08/23 1948 11/09/23 1804 11/10/23 1148  BILITOT 0.9  --  0.6 1.0  --  1.1  --   AST 39  --  10* 16  --  16  --   ALT 13  --  10 15  --  32  --   ALKPHOS 116  --  115 96  --  77  --   PROT 5.9*  --  7.7 7.1  --  6.5   --   ALBUMIN  3.1*   < > 4.9 3.7 3.1* 2.6* 2.9*   < > = values in this interval not displayed.    TUMOR MARKERS: No results for input(s): "AFPTM", "CEA", "CA199", "CHROMGRNA" in the last 8760 hours.  Assessment and Plan: Pelvic Fluid Collection  Patient is a 88 y/o male with  history of a fib, ESRD, depression, obstructive uropathy-s/p left PCN placement, ulcerative colitis-s/p colectomy 2011 with ileostomy, anemia, DVT. Patient reports drainage from the area of concern for 2 weeks. CT chest/abdomen/pelvis performed 11/09/23, which reported presacral fluid collection, which has slight increased in size from 09/06/23. Extension of fluid collection into the left ischioanal fossa. The presacral fluid collection abuts the anterior cortex of the sacrum which demonstrated mild irregularity, suspicious for osteomyelitis. After review by Dr. Julietta Ogren, patient approved for drain placement. Last Eliquis  dose was 1014 am 11/09/13. Patient NPO starting midnight.  Risks and benefits discussed with the patient including bleeding, infection, damage to adjacent structures, bowel perforation/fistula connection, and sepsis.  All of the patient's questions were answered, patient is agreeable to proceed. Consent signed and in chart.   Thank you for this interesting consult.  I greatly enjoyed meeting Joshua Riley and look forward to participating in their care.  A copy of this report was sent to the requesting provider on this date.  Electronically Signed: Lawrance Presume, PA 11/10/2023, 2:49 PM   I spent a total of 20 Minutes    in face to face in clinical consultation, greater than 50% of which was counseling/coordinating care for pelvic fluid collection

## 2023-11-10 NOTE — Plan of Care (Signed)
  Problem: Education: Goal: Knowledge of General Education information will improve Description: Including pain rating scale, medication(s)/side effects and non-pharmacologic comfort measures Outcome: Progressing   Problem: Health Behavior/Discharge Planning: Goal: Ability to manage health-related needs will improve Outcome: Progressing   Problem: Clinical Measurements: Goal: Ability to maintain clinical measurements within normal limits will improve Outcome: Progressing Goal: Will remain free from infection Outcome: Progressing Goal: Diagnostic test results will improve Outcome: Progressing Goal: Respiratory complications will improve Outcome: Progressing Goal: Cardiovascular complication will be avoided Outcome: Progressing   Problem: Activity: Goal: Risk for activity intolerance will decrease Outcome: Progressing   Problem: Nutrition: Goal: Adequate nutrition will be maintained Outcome: Progressing   Problem: Coping: Goal: Level of anxiety will decrease Outcome: Progressing   Problem: Pain Managment: Goal: General experience of comfort will improve and/or be controlled Outcome: Progressing   Problem: Safety: Goal: Ability to remain free from injury will improve Outcome: Progressing   Problem: Skin Integrity: Goal: Risk for impaired skin integrity will decrease Outcome: Progressing   Problem: Education: Goal: Understanding of cardiac disease, CV risk reduction, and recovery process will improve Outcome: Progressing Goal: Individualized Educational Video(s) Outcome: Progressing   Problem: Activity: Goal: Ability to tolerate increased activity will improve Outcome: Progressing   Problem: Cardiac: Goal: Ability to achieve and maintain adequate cardiovascular perfusion will improve Outcome: Progressing   Problem: Health Behavior/Discharge Planning: Goal: Ability to safely manage health-related needs after discharge will improve Outcome: Progressing

## 2023-11-10 NOTE — TOC CM/SW Note (Signed)
 Transition of Care [facility]) - Inpatient Brief Assessment   Patient Details  Name: Joshua Riley MRN: [id number] Date of Birth: 1935-08-12  Transition of Care [facility]) CM/SW Contact:    Jennett Model, RN Phone Number: 11/10/2023, 3:47 PM   Clinical Narrative: From home alone,, has PCP and insurance on file, states has no HH services in place at this time , has walker and cane  at home.  States daughter, Joshua Riley, will transport them home at dc and she  is support system, states gets medications from [facility] on [address].  Pta self ambulatory with walker.  Patient gives this NCM permission to speak with daughter, Joshua Riley.    Transition of Care Asessment: Insurance and Status: Insurance coverage has been reviewed Patient has primary care physician: Yes Home environment has been reviewed: home alone Prior level of function:: indep Prior/Current Home Services: Current home services (walker, cane) Social Drivers of Health Review: SDOH reviewed no interventions necessary Readmission risk has been reviewed: Yes Transition of care needs: no transition of care needs at this time

## 2023-11-10 NOTE — Progress Notes (Signed)
 Progress Note  Patient Name: Joshua Riley Date of Encounter: 11/10/2023  Primary Cardiologist:   Eilleen Grates, MD   Subjective   Chest pain is much improved.  Chronic dyspnea  Inpatient Medications    Scheduled Meds:  apixaban   2.5 mg Oral BID   atorvastatin  20 mg Oral Daily   Chlorhexidine  Gluconate Cloth  6 each Topical Q0600   ibuprofen  800 mg Oral TID   midodrine   15 mg Oral TID   thiamine (VITAMIN B1) injection  100 mg Intravenous Daily   Continuous Infusions:  meropenem  (MERREM ) IV     [START ON 11/11/2023] vancomycin      PRN Meds: acetaminophen , albuterol , nitroGLYCERIN, ondansetron  (ZOFRAN ) IV, mouth rinse, polyethylene glycol   Vital Signs    Vitals:   11/10/23 0321 11/10/23 0400 11/10/23 0505 11/10/23 0743  BP: (!) 81/52 (!) 95/55 106/79 (!) 89/59  Pulse:    75  Resp: 18 20 18 19   Temp:  97.6 F (36.4 C)  (!) 97.5 F (36.4 C)  TempSrc:  Oral  Oral  SpO2:  93%  97%  Weight:      Height:        Intake/Output Summary (Last 24 hours) at 11/10/2023 0808 Last data filed at 11/10/2023 0752 Gross per 24 hour  Intake 592.48 ml  Output 670 ml  Net -77.52 ml   Filed Weights   11/09/23 1700  Weight: 68.8 kg    Telemetry    NSR - Personally Reviewed  ECG    NA - Personally Reviewed  Physical Exam   GEN: No acute distress.   Neck: No  JVD Cardiac: Irregular RR, 2/6 apical systolic murmur, no diastolic murmurs, positive rub, or gallops.  Respiratory: Clear  to auscultation bilaterally. GI: Soft, nontender, non-distended  MS: No  edema; No deformity. Neuro:  Nonfocal  Psych: Normal affect   Labs    Chemistry Recent Labs  Lab 11/08/23 2240 11/09/23 1804  NA 136 132*  K 3.7 4.4  CL 92* 94*  CO2 25 20*  GLUCOSE 167* 137*  BUN 67* 78*  CREATININE 10.10* 11.24*  CALCIUM 10.2 8.9  PROT  --  6.5  ALBUMIN   --  2.6*  AST  --  16  ALT  --  32  ALKPHOS  --  77  BILITOT  --  1.1  GFRNONAA 5* 4*  ANIONGAP 20* 18*      Hematology Recent Labs  Lab 11/08/23 2240 11/10/23 0347  WBC 15.8* 13.2*  RBC 2.49* 2.36*  HGB 9.8* 9.2*  HCT 29.0* 27.2*  MCV 116.5* 115.3*  MCH 39.4* 39.0*  MCHC 33.8 33.8  RDW 15.5 15.7*  PLT 197 226    Cardiac EnzymesNo results for input(s): "TROPONINI" in the last 168 hours. No results for input(s): "TROPIPOC" in the last 168 hours.   BNPNo results for input(s): "BNP", "PROBNP" in the last 168 hours.   DDimer No results for input(s): "DDIMER" in the last 168 hours.   Radiology    US  Abdomen Limited RUQ (LIVER/GB) Result Date: 11/09/2023 CLINICAL DATA:  [id number] Abdominal pain [id number] Cholelithiases 92642 EXAM: ULTRASOUND ABDOMEN LIMITED RIGHT UPPER QUADRANT COMPARISON:  None Available. FINDINGS: Gallbladder: Gallbladder wall thickened. Shadowing stone at the fundus. No pericholecystic fluid. Common bile duct: Diameter: 0.7 cm. Liver: Liver is hyperechoic consistent with fatty infiltration. No focal hepatic lesions identified. No intrahepatic ductal dilatation. Hepatopetal portal vein flow. IMPRESSION: 1. Cholelithiasis. Gallbladder wall thickening. 2. Fatty infiltration of the liver. Electronically  Signed   By: Sydell Eva M.D.   On: 11/09/2023 22:15   CT Angio Chest/Abd/Pel for Dissection W and/or Wo Contrast Result Date: 11/09/2023 CLINICAL DATA:  Acute aortic syndrome suspected. Chest pain and shortness of breath. EXAM: CT ANGIOGRAPHY CHEST, ABDOMEN AND PELVIS TECHNIQUE: Non-contrast CT of the chest was initially obtained. Multidetector CT imaging through the chest, abdomen and pelvis was performed using the standard protocol during bolus administration of intravenous contrast. Multiplanar reconstructed images and MIPs were obtained and reviewed to evaluate the vascular anatomy. RADIATION DOSE REDUCTION: This exam was performed according to the departmental dose-optimization program which includes automated exposure control, adjustment of the mA and/or kV according to  patient size and/or use of iterative reconstruction technique. CONTRAST:  OMNIPAQUE  IOHEXOL  350 MG/ML SOLN COMPARISON:  Chest radiograph 11/08/2023; CT abdomen pelvis 09/06/2023; CT chest 08/18/2023 FINDINGS: CTA CHEST FINDINGS Cardiovascular: No intramural hematoma, penetrating atherosclerotic ulcer, or dissection. Normal caliber thoracic aorta. Aortic and coronary artery atherosclerotic calcification. Right subclavian vein stent. Mediastinum/Nodes: Trachea is patent. Unremarkable thyroid and esophagus. No lymphadenopathy. Lungs/Pleura: Lower lobe atelectasis and scarring. Otherwise no focal consolidation. No pleural effusion or pneumothorax. 7 mm right lower lobe nodule on series 7/image 103 is unchanged. Musculoskeletal: No acute fracture. Review of the MIP images confirms the above findings. CTA ABDOMEN AND PELVIS FINDINGS VASCULAR Aorta: Calcified atherosclerotic plaque throughout the abdominal aorta without hemodynamically significant stenosis. No aneurysm or dissection. Celiac: Severe narrowing at the origin secondary to calcified atherosclerotic plaque. No aneurysm or dissection. SMA: Calcified plaque without hemodynamically significant stenosis. No aneurysm or dissection. Renals: Calcified plaque at the origin of the left renal artery. No hemodynamically significant stenosis. No aneurysm or dissection. IMA: Not visualized and likely occluded at the origin. Inflow: Redemonstrated surgical grafts arising from the left common femoral artery and vein extending inferiorly in the subcutaneous ventral left thigh. No hemodynamically significant stenosis. No aneurysm or dissection. Veins: No obvious acute venous abnormality within the limitations of this arterial phase study. Review of the MIP images confirms the above findings. NON-VASCULAR Hepatobiliary: Cholelithiasis. No evidence of acute cholecystitis. Unremarkable liver and biliary tree. Pancreas: Unremarkable. Spleen: Unremarkable. Adrenals/Urinary  Tract: Normal adrenal glands. Atrophic native kidneys with multiple small hypoattenuating lesions statistically likely to represent cysts. No follow-up recommended. Percutaneous nephrostomy tube in the left kidney. No urinary calculi or hydronephrosis. Irregular asymmetric left wall thickening about the nondistended bladder. 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 Herniation of a nonobstructed loop of small bowel into the parastomal hernia. No bowel wall thickening or obstruction. Stomach is within normal limits. Lymphatic: No lymphadenopathy. Reproductive: No acute abnormality.  Penile prosthesis. Other: The presacral peripherally enhancing fluid collection has slightly increased in size compared to 09/06/2023 now measuring 4.7 x 5.5 cm in the axial plane, previously 3.7 x 5.5 cm. Extension of the fluid collection into the left ischial anal fossa. Mild adjacent stranding. No free intraperitoneal air. Musculoskeletal: No acute fracture. The presacral fluid collection abuts the anterior cortex of the sacrum which demonstrates mild irregularity suspicious for osteomyelitis. 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 Review of the MIP images confirms the above findings. IMPRESSION: 1. No acute aortic syndrome. 2. The presacral fluid collection has slightly increased in size compared to 09/06/2023. Extension of the fluid collection into the left ischioanal fossa. The presacral fluid collection abuts the anterior cortex of the sacrum which demonstrates mild irregularity suspicious for osteomyelitis. 3. Irregular asymmetric left bladder wall thickening similar to prior.  Cystitis versus malignancy. 4. Cholelithiasis. 5. Stable 7 mm right lower lobe nodule. Electronically Signed   By: Rozell Cornet M.D.   On: 11/09/2023 00:56   DG Chest 2 View Result Date: 11/08/2023 CLINICAL DATA:  Shortness of breath and chest pain EXAM: CHEST - 2 VIEW COMPARISON:   Radiograph 07/07/2023 FINDINGS: Stable cardiomediastinal silhouette. Calcified tortuous aorta. Low lung volumes accentuate pulmonary vascularity. Chronic bronchitic changes. Left basilar atelectasis. No pleural effusion or pneumothorax. Right subclavian vascular stent. IMPRESSION: Chronic bronchitic changes.  Left basilar atelectasis/scarring. Electronically Signed   By: Rozell Cornet M.D.   On: 11/08/2023 23:06    Cardiac Studies   Echo pending.   Patient Profile     88 y.o. male with a history of normal coronaries on cardiac catheterization in 07/2022, persistent atrial fibrillation on Eliquis , prior CVA, prior DVT, hypotension on Midodrine  on dialysis days, ESRD on hemodialysis on M/W/F, obstructive uropathy s/p chronic left percutenaous nephrostomy, ulcerative colitis s/p colostomy in 2012, GI bleed, and obstructive sleep apnea who is being seen 11/09/2023 for the evaluation of NSTEMI at the request of Dr. Lydia Sams.   Assessment & Plan    Chest pain:  Likely pericarditis.  I am treating with high dose NSAIDs despite DOAC and bleeding risk.  I would like to limit the coarse of this to 10 days if possible or shorter if he has significant pain relief.  Unable to use NSAIDs.  I am not suspecting an acute coronary syndrome.  Repeat EKG.  Echo pending.     Persistent atrial fib:   Continue DOAC.   Hypotension:    Continue current meds  ESRD:  Per primary team.  Today is a dialysis day.   Dyspnea:   Chronic problem with no clear single etiology identified.  He has had a significant evaluation.    For questions or updates, please contact [facility] Please consult [email or web address] for contact info under Cardiology/STEMI.   Signed, Eilleen Grates, MD  11/10/2023, 8:08 AM

## 2023-11-10 NOTE — Consult Note (Signed)
 Joshua Riley 08-Sep-1935  [id number].    Requesting MD: Dr. Brunilda Capra Chief Complaint/Reason for Consult: recurrent presacral fluid collection   HPI:  This is an 88 yo with multiple medical problems including, ESRD on HD (MWF), hypotension, H/O UC s/p multiple abdominal operations resulting in TAC and ileostomy with ureteral injuries and stenoses resulting in the need for B PCNs, now just on the left, h/o multiple prior SBOs (resolved with conservative management), H/O DVT, anemia, RBBB with first degree AV block noted on EKG on 09/13/21 per care everywhere at cardiology office in [facility].   He has had a complex course since being here in the last 2 years secondary to recurrent psbos as well as this recurrent presacral fluid collection.  He has been followed by Dr. Genita Keys, colorectal with [facility], last year.  He has not seen her since that time.  He was noted to have this collection and had a drain placed which was sterile in nature.  His drain was removed after resolution.  Unfortunately it returned and he got another drain in January of 2025.  This unfortunately revealed E coli which was resistant to everything but gent and imipenem.  Again after resolution, this drain was removed.  In March of this year he was admitted for psbo and his imaging did not show a recurrence.  However, he started having chest pain on Sunday and has been admitted for pericarditis.  Incidentally on imaging he was found to have a recurrence of this presacral fluid collection.  He does admit that he started having drainage again 2 weeks ago c/w its typical course when this collection recurs.  It is thin and slightly brown in nature.  No fevers or pain in his back.  We have been asked to see him for evaluation of this recurrent collection.   ROS: ROS: : see HPI  Family History  Problem Relation Age of Onset   Stroke Mother    Cancer Father    Esophageal cancer Brother     Past Medical History:  Diagnosis Date   A-fib  (HCC)    Anemia    Arthritis    Cancer (HCC)    Basal cell   COVID-19    2021   Dysrhythmia    Afib-controlled on eliquis   ESRD (end stage renal disease) (HCC) 10/22/2021   Glaucoma 11/18/2021   History of DVT (deep vein thrombosis)    Hydronephrosis    managed wtih a PCN   Idiopathic neuropathy 10/22/2021   lyrica    Ileostomy in place (HCC)    Obstructive uropathy    With chronic left nephrostomy   Old retinal detachment, total or subtotal    Orthostatic hypotension 10/22/2021   Sleep apnea    does not need a machine   Stroke (HCC)    Ulcerative colitis (HCC)    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    Past Surgical History:  Procedure Laterality Date   BASAL CELL CARCINOMA EXCISION     10 /23   COLON SURGERY     creation of j pouch     and subsequent takedown of j pouch   EYE SURGERY     IR NEPHROSTOMY EXCHANGE LEFT  12/10/2021   IR NEPHROSTOMY EXCHANGE LEFT  04/22/2022   IR NEPHROSTOMY EXCHANGE LEFT  07/29/2022   IR NEPHROSTOMY EXCHANGE LEFT  10/28/2022   IR NEPHROSTOMY EXCHANGE LEFT  02/05/2023   IR NEPHROSTOMY EXCHANGE LEFT  05/07/2023  IR NEPHROSTOMY EXCHANGE LEFT  06/25/2023   IR NEPHROSTOMY EXCHANGE LEFT  07/17/2023   IR NEPHROSTOMY EXCHANGE LEFT  10/22/2023   LEFT HEART CATH AND CORONARY ANGIOGRAPHY N/A 07/22/2022   Procedure: LEFT HEART CATH AND CORONARY ANGIOGRAPHY;  Surgeon: [place]az[place], Peter M, MD;  Location: [facility] INVASIVE CV LAB;  Service: Cardiovascular;  Laterality: N/A;   REVISION OF ARTERIOVENOUS GORETEX GRAFT Left 05/06/2022   Procedure: REDO LEFT THIGH ARTERIOVENOUS 4-7 MM GORETEX GRAFT;  Surgeon: Dannis Dy, MD;  Location: [facility] OR;  Service: Vascular;  Laterality: Left;   SMALL INTESTINE SURGERY     TOTAL COLECTOMY      Social History:  reports that he quit smoking about 40 years ago. His smoking use included cigarettes. He started smoking about 46 years ago. He has a 12 pack-year smoking history. He has never been exposed  to tobacco smoke. He has never used smokeless tobacco. He reports current alcohol use of about 5.0 standard drinks of alcohol per week. He reports that he does not use drugs.  Allergies:  Allergies  Allergen Reactions   Cephalosporins Rash   Ciprofloxacin Itching and Rash   Baclofen Other (See Comments)    Altered mental status, after accidental overdose      Medications Prior to Admission  Medication Sig Dispense Refill   albuterol  (PROVENTIL ) (2.5 MG/3ML) 0.083% nebulizer solution Take 2.5 mg by nebulization every 6 (six) hours as needed for wheezing or shortness of breath.     albuterol  (VENTOLIN  HFA) 108 (90 Base) MCG/ACT inhaler Inhale 1-2 puffs into the lungs every 6 (six) hours as needed for wheezing or shortness of breath.     budesonide  (PULMICORT ) 0.5 MG/2ML nebulizer solution Take 0.5 mg by nebulization 2 (two) times daily as needed (for respiratory flares).     Cyanocobalamin (VITAMIN B-12 PO) Take 1 capsule by mouth in the morning and at bedtime.     dorzolamide  (TRUSOPT ) 2 % ophthalmic solution Place 1 drop into the right eye at bedtime.     ELIQUIS  2.5 MG TABS tablet Take 1 tablet (2.5 mg total) by mouth 2 (two) times daily. 60 tablet 11   folic acid  (FOLVITE ) 1 MG tablet Take 1 tablet (1 mg total) by mouth daily. 30 tablet 0   gabapentin (NEURONTIN) 300 MG capsule Take 300 mg by mouth at bedtime.     midodrine  (PROAMATINE ) 10 MG tablet Take 1 tablet (10 mg total) by mouth daily. (Patient taking differently: Take 10 mg by mouth 3 (three) times daily.)     mometasone -formoterol  (DULERA ) 200-5 MCG/ACT AERO Inhale 2 puffs into the lungs 2 (two) times daily. 1 each 1   Omega Fatty Acids-Vitamins (OMEGA-3 GUMMIES) CHEW Chew 1 tablet by mouth in the morning and at bedtime.     pantoprazole  (PROTONIX ) 40 MG tablet Take 1 tablet (40 mg total) by mouth 2 (two) times daily. (Patient taking differently: Take 40 mg by mouth 2 (two) times daily as needed (for heartburn or reflux).) 60  tablet 0   ramelteon  (ROZEREM ) 8 MG tablet Take 1 tablet (8 mg total) by mouth at bedtime. (Patient taking differently: Take 8 mg by mouth at bedtime as needed for sleep.) 30 tablet 5   sertraline  (ZOLOFT ) 50 MG tablet Take 25 mg by mouth at bedtime.     sevelamer  carbonate (RENVELA ) 800 MG tablet Take 1,600 mg by mouth 3 (three) times daily with meals.     timolol  (TIMOPTIC ) 0.5 % ophthalmic solution Place 1 drop into both eyes  in the morning and at bedtime.     traMADol  (ULTRAM ) 50 MG tablet Take 50 mg by mouth every 8 (eight) hours as needed (for pain).     TYLENOL  325 MG tablet Take 325-650 mg by mouth every 6 (six) hours as needed for mild pain (pain score 1-3) or headache.       Physical Exam: Blood pressure (!) 82/61, pulse 63, temperature 97.7 F (36.5 C), temperature source Oral, resp. rate 17, height 5\' 8"  (1.727 m), weight 68.8 kg, SpO2 97%. General: pleasant, WD, WN, elderly white male who is laying in bed in NAD HEENT: head is normocephalic, atraumatic.  Sclera are noninjected.  PERRL.  Ears and nose without any masses or lesions.  Mouth is pink and moist Heart: regular, rate, and rhythm.  Normal s1,s2. + rub.  Lungs: CTAB, no wheezes, rhonchi, or rales noted.  Respiratory effort nonlabored Abd: soft, NT, ND, +BS, ileostomy in place with good output. Skin: warm and dry with no masses, lesions, or rashes.  He has some thin tan drainage noted in his perineum near his rectal closure from prior APR.  No specific tract is noted that I can see on exam, but clearly there is one somewhere but small.  No pain over his sacrum or erythema Psych: A&Ox3 with an appropriate affect.   Results for orders placed or performed during the hospital encounter of 11/08/23 (from the past 48 hours)  Basic metabolic panel     Status: Abnormal   Collection Time: 11/08/23 10:40 PM  Result Value Ref Range   Sodium 136 135 - 145 mmol/L   Potassium 3.7 3.5 - 5.1 mmol/L   Chloride 92 (L) 98 - 111 mmol/L    CO2 25 22 - 32 mmol/L   Glucose, Bld 167 (H) 70 - 99 mg/dL    Comment: Glucose reference range applies only to samples taken after fasting for at least 8 hours.   BUN 67 (H) 8 - 23 mg/dL   Creatinine, Ser 82.95 (H) 0.61 - 1.24 mg/dL   Calcium 62.1 8.9 - 30.8 mg/dL   GFR, Estimated 5 (L) >60 mL/min    Comment: (NOTE) Calculated using the CKD-EPI Creatinine Equation (2021)    Anion gap 20 (H) 5 - 15    Comment: Performed at [facility], [address] [place], [place] [address]  CBC     Status: Abnormal   Collection Time: 11/08/23 10:40 PM  Result Value Ref Range   WBC 15.8 (H) 4.0 - 10.5 K/uL   RBC 2.49 (L) 4.22 - 5.81 MIL/uL   Hemoglobin 9.8 (L) 13.0 - 17.0 g/dL   HCT 69.6 (L) 29.5 - 28.4 %   MCV 116.5 (H) 80.0 - 100.0 fL   MCH 39.4 (H) 26.0 - 34.0 pg   MCHC 33.8 30.0 - 36.0 g/dL   RDW 13.2 44.0 - 10.2 %   Platelets 197 150 - 400 K/uL   nRBC 0.0 0.0 - 0.2 %    Comment: Performed at [facility], [address] [place], [place] [address]  Troponin T, High Sensitivity     Status: Abnormal   Collection Time: 11/08/23 10:40 PM  Result Value Ref Range   Troponin T High Sensitivity 428 (HH) <19 ng/L    Comment: Critical Value, Read Back and verified with Rodney Clamp, RN at 2320 on 11/08/23 by SU (NOTE) Biotin concentrations > 1000 ng/mL falsely decrease TnT results.  Serial cardiac troponin measurements are suggested.  Refer to the Links  section for chest pain algorithms and additional  guidance. Performed at [facility], [address], [place], [place] [address]   Troponin T, High Sensitivity     Status: Abnormal   Collection Time: 11/09/23 12:56 AM  Result Value Ref Range   Troponin T High Sensitivity 374 (HH) <19 ng/L    Comment: Critical Value, Read Back and verified with Rodney Clamp, RN at 0139 on 11/09/23 by SU (NOTE) Biotin concentrations > 1000 ng/mL falsely decrease TnT results.  Serial cardiac  troponin measurements are suggested.  Refer to the Links section for chest pain algorithms and additional  guidance. Performed at [facility], [address] [place], [place] [address]   Type and screen     Status: None   Collection Time: 11/09/23  5:58 PM  Result Value Ref Range   ABO/RH(D) B NEG    Antibody Screen NEG    Sample Expiration      11/12/2023,2359 Performed at [facility], [address] [place], [place] [address]   Heparin  level (unfractionated)     Status: Abnormal   Collection Time: 11/09/23  6:04 PM  Result Value Ref Range   Heparin  Unfractionated >1.10 (H) 0.30 - 0.70 IU/mL    Comment: (NOTE) The clinical reportable range upper limit is being lowered to >1.10 to align with the FDA approved guidance for the current laboratory assay.  If heparin  results are below expected values, and patient dosage has  been confirmed, suggest follow up testing of antithrombin III levels. Performed at [facility], [address], [place], [place] [address]   APTT     Status: Abnormal   Collection Time: 11/09/23  6:04 PM  Result Value Ref Range   aPTT 42 (H) 24 - 36 seconds    Comment:        IF BASELINE aPTT IS ELEVATED, SUGGEST PATIENT RISK ASSESSMENT BE USED TO DETERMINE APPROPRIATE ANTICOAGULANT THERAPY. Performed at [facility], [address] [place], [place] [address]   Comprehensive metabolic panel     Status: Abnormal   Collection Time: 11/09/23  6:04 PM  Result Value Ref Range   Sodium 132 (L) 135 - 145 mmol/L   Potassium 4.4 3.5 - 5.1 mmol/L   Chloride 94 (L) 98 - 111 mmol/L   CO2 20 (L) 22 - 32 mmol/L   Glucose, Bld 137 (H) 70 - 99 mg/dL    Comment: Glucose reference range applies only to samples taken after fasting for at least 8 hours.   BUN 78 (H) 8 - 23 mg/dL   Creatinine, Ser 13.08 (H) 0.61 - 1.24 mg/dL   Calcium 8.9 8.9 - 65.7 mg/dL   Total Protein 6.5 6.5 - 8.1 g/dL   Albumin  2.6 (L) 3.5 - 5.0 g/dL    AST 16 15 - 41 U/L   ALT 32 0 - 44 U/L   Alkaline Phosphatase 77 38 - 126 U/L   Total Bilirubin 1.1 0.0 - 1.2 mg/dL   GFR, Estimated 4 (L) >60 mL/min    Comment: (NOTE) Calculated using the CKD-EPI Creatinine Equation (2021)    Anion gap 18 (H) 5 - 15    Comment: Performed at [facility], [address] [place], [place] [address]  Magnesium      Status: None   Collection Time: 11/09/23  6:04 PM  Result Value Ref Range   Magnesium  2.2 1.7 - 2.4 mg/dL    Comment: Performed at [facility], [address], [place], [place] [address]  Vitamin B12     Status: Abnormal   Collection Time: 11/09/23  6:04 PM  Result Value Ref Range   Vitamin B-12 5,684 (H) 180 - 914 pg/mL    Comment: (NOTE) This assay is not validated for testing neonatal or myeloproliferative syndrome specimens for Vitamin B12 levels. Performed at [facility], [address], [place], [place] [address]   Hemoglobin A1c     Status: Abnormal   Collection Time: 11/09/23  9:52 PM  Result Value Ref Range   Hgb A1c MFr Bld 5.9 (H) 4.8 - 5.6 %    Comment: (NOTE) Pre diabetes:          5.7%-6.4%  Diabetes:              >6.4%  Glycemic control for   <7.0% adults with diabetes    Mean Plasma Glucose 122.63 mg/dL    Comment: Performed at [facility], [address] [place], [place] [address]  Ethanol     Status: None   Collection Time: 11/09/23  9:52 PM  Result Value Ref Range   Alcohol, Ethyl (B) <15 <15 mg/dL    Comment: Please note change in reference range. (NOTE) For medical purposes only. Performed at [facility], [address] [place], [place] [address]   Culture, blood (Routine X 2) w Reflex to ID Panel     Status: None (Preliminary result)   Collection Time: 11/09/23  9:52 PM   Specimen: BLOOD  Result Value Ref Range   Specimen Description BLOOD BLOOD RIGHT ARM    Special Requests      BOTTLES DRAWN AEROBIC AND ANAEROBIC Blood Culture results may not be optimal due to  an inadequate volume of blood received in culture bottles   Culture      NO GROWTH < 12 HOURS Performed at [facility], [address], [place], [place] [address]    Report Status PENDING   Culture, blood (Routine X 2) w Reflex to ID Panel     Status: None (Preliminary result)   Collection Time: 11/09/23  9:52 PM   Specimen: BLOOD  Result Value Ref Range   Specimen Description BLOOD BLOOD RIGHT HAND    Special Requests      BOTTLES DRAWN AEROBIC AND ANAEROBIC Blood Culture results may not be optimal due to an inadequate volume of blood received in culture bottles   Culture      NO GROWTH < 12 HOURS Performed at [facility], [address], [place], [place] [address]    Report Status PENDING   CBC     Status: Abnormal   Collection Time: 11/10/23  3:47 AM  Result Value Ref Range   WBC 13.2 (H) 4.0 - 10.5 K/uL   RBC 2.36 (L) 4.22 - 5.81 MIL/uL   Hemoglobin 9.2 (L) 13.0 - 17.0 g/dL   HCT 57.8 (L) 46.9 - 62.9 %   MCV 115.3 (H) 80.0 - 100.0 fL   MCH 39.0 (H) 26.0 - 34.0 pg   MCHC 33.8 30.0 - 36.0 g/dL   RDW 52.8 (H) 41.3 - 24.4 %   Platelets 226 150 - 400 K/uL   nRBC 0.0 0.0 - 0.2 %    Comment: Performed at [facility], [address] [place], [place] [address]  Lipid panel     Status: Abnormal   Collection Time: 11/10/23  3:47 AM  Result  Value Ref Range   Cholesterol 76 0 - 200 mg/dL   Triglycerides 161 (H) <150 mg/dL   HDL 25 (L) >09 mg/dL   Total CHOL/HDL Ratio 3.0 RATIO   VLDL 36 0 - 40 mg/dL   LDL Cholesterol 15 0 - 99 mg/dL    Comment:        Total Cholesterol/HDL:CHD Risk Coronary Heart Disease Risk Table                     Men   Women  1/2 Average Risk   3.4   3.3  Average Risk       5.0   4.4  2 X Average Risk   9.6   7.1  3 X Average Risk  23.4   11.0        Use the calculated Patient Ratio above and the CHD Risk Table to determine the patient's CHD Risk.        ATP III CLASSIFICATION (LDL):  <100     mg/dL   Optimal  604-540  mg/dL    Near or Above                    Optimal  130-159  mg/dL   Borderline  981-191  mg/dL   High  >478     mg/dL   Very High Performed at [facility], [address] [place], [place] [address]    US  Abdomen Limited RUQ (LIVER/GB) Result Date: 11/09/2023 CLINICAL DATA:  [id number] Abdominal pain [id number] Cholelithiases [address]642 EXAM: ULTRASOUND ABDOMEN LIMITED RIGHT UPPER QUADRANT COMPARISON:  None Available. FINDINGS: Gallbladder: Gallbladder wall thickened. Shadowing stone at the fundus. No pericholecystic fluid. Common bile duct: Diameter: 0.7 cm. Liver: Liver is hyperechoic consistent with fatty infiltration. No focal hepatic lesions identified. No intrahepatic ductal dilatation. Hepatopetal portal vein flow. IMPRESSION: 1. Cholelithiasis. Gallbladder wall thickening. 2. Fatty infiltration of the liver. Electronically Signed   By: Sydell Eva M.D.   On: 11/09/2023 22:15   CT Angio Chest/Abd/Pel for Dissection W and/or Wo Contrast Result Date: 11/09/2023 CLINICAL DATA:  Acute aortic syndrome suspected. Chest pain and shortness of breath. EXAM: CT ANGIOGRAPHY CHEST, ABDOMEN AND PELVIS TECHNIQUE: Non-contrast CT of the chest was initially obtained. Multidetector CT imaging through the chest, abdomen and pelvis was performed using the standard protocol during bolus administration of intravenous contrast. Multiplanar reconstructed images and MIPs were obtained and reviewed to evaluate the vascular anatomy. RADIATION DOSE REDUCTION: This exam was performed according to the departmental dose-optimization program which includes automated exposure control, adjustment of the mA and/or kV according to patient size and/or use of iterative reconstruction technique. CONTRAST:  OMNIPAQUE  IOHEXOL  350 MG/ML SOLN COMPARISON:  Chest radiograph 11/08/2023; CT abdomen pelvis 09/06/2023; CT chest 08/18/2023 FINDINGS: CTA CHEST FINDINGS Cardiovascular: No intramural hematoma, penetrating atherosclerotic ulcer, or  dissection. Normal caliber thoracic aorta. Aortic and coronary artery atherosclerotic calcification. Right subclavian vein stent. Mediastinum/Nodes: Trachea is patent. Unremarkable thyroid and esophagus. No lymphadenopathy. Lungs/Pleura: Lower lobe atelectasis and scarring. Otherwise no focal consolidation. No pleural effusion or pneumothorax. 7 mm right lower lobe nodule on series 7/image 103 is unchanged. Musculoskeletal: No acute fracture. Review of the MIP images confirms the above findings. CTA ABDOMEN AND PELVIS FINDINGS VASCULAR Aorta: Calcified atherosclerotic plaque throughout the abdominal aorta without hemodynamically significant stenosis. No aneurysm or dissection. Celiac: Severe narrowing at the origin secondary to calcified atherosclerotic plaque. No aneurysm or dissection. SMA: Calcified plaque without hemodynamically significant  stenosis. No aneurysm or dissection. Renals: Calcified plaque at the origin of the left renal artery. No hemodynamically significant stenosis. No aneurysm or dissection. IMA: Not visualized and likely occluded at the origin. Inflow: Redemonstrated surgical grafts arising from the left common femoral artery and vein extending inferiorly in the subcutaneous ventral left thigh. No hemodynamically significant stenosis. No aneurysm or dissection. Veins: No obvious acute venous abnormality within the limitations of this arterial phase study. Review of the MIP images confirms the above findings. NON-VASCULAR Hepatobiliary: Cholelithiasis. No evidence of acute cholecystitis. Unremarkable liver and biliary tree. Pancreas: Unremarkable. Spleen: Unremarkable. Adrenals/Urinary Tract: Normal adrenal glands. Atrophic native kidneys with multiple small hypoattenuating lesions statistically likely to represent cysts. No follow-up recommended. Percutaneous nephrostomy tube in the left kidney. No urinary calculi or hydronephrosis. Irregular asymmetric left wall thickening about the  nondistended bladder. 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 Herniation of a nonobstructed loop of small bowel into the parastomal hernia. No bowel wall thickening or obstruction. Stomach is within normal limits. Lymphatic: No lymphadenopathy. Reproductive: No acute abnormality.  Penile prosthesis. Other: The presacral peripherally enhancing fluid collection has slightly increased in size compared to 09/06/2023 now measuring 4.7 x 5.5 cm in the axial plane, previously 3.7 x 5.5 cm. Extension of the fluid collection into the left ischial anal fossa. Mild adjacent stranding. No free intraperitoneal air. Musculoskeletal: No acute fracture. The presacral fluid collection abuts the anterior cortex of the sacrum which demonstrates mild irregularity suspicious for osteomyelitis. 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 Review of the MIP images confirms the above findings. IMPRESSION: 1. No acute aortic syndrome. 2. The presacral fluid collection has slightly increased in size compared to 09/06/2023. Extension of the fluid collection into the left ischioanal fossa. The presacral fluid collection abuts the anterior cortex of the sacrum which demonstrates mild irregularity suspicious for osteomyelitis. 3. Irregular asymmetric left bladder wall thickening similar to prior. Cystitis versus malignancy. 4. Cholelithiasis. 5. Stable 7 mm right lower lobe nodule. Electronically Signed   By: Rozell Cornet M.D.   On: 11/09/2023 00:56   DG Chest 2 View Result Date: 11/08/2023 CLINICAL DATA:  Shortness of breath and chest pain EXAM: CHEST - 2 VIEW COMPARISON:  Radiograph 07/07/2023 FINDINGS: Stable cardiomediastinal silhouette. Calcified tortuous aorta. Low lung volumes accentuate pulmonary vascularity. Chronic bronchitic changes. Left basilar atelectasis. No pleural effusion or pneumothorax. Right subclavian vascular stent. IMPRESSION: Chronic bronchitic changes.   Left basilar atelectasis/scarring. Electronically Signed   By: Rozell Cornet M.D.   On: 11/08/2023 23:06      Assessment/Plan H/O UC, s/p TAC with ileostomy, recurrent presacral fluid collection The patient has been seen, examined, labs, vitals, and imaging reviewed.  His care everywhere chart has been reviewed and I have spoken with Dr. Genita Keys, colorectal surgeon at [facility] that has been following him last year.  The patient's original collection was sterile, but unfortunately on repeat drainage in January, this grew E. Coli that was resistant as outlined above.  This has recurred again recently and was incidentally found on imaging yesterday in work up of his pericarditis.  He has had an increase in drainage in the last 2 weeks that is new again.  There are no surgical options for this problem unfortunately in speaking with Dr. Genita Keys.  This is not in communication with his bowel.  Given the drainage and size, we will have IR evaluate him for new drain placement and sending the fluid for culture.  His  CT scan showed some irregularity of the sacrum which raised concern for possible osteomyelitis.  Unclear if this is real or not as CT is not the best way to look at this.  Will defer to primary service.  Could order an MRI if wanted to pursue further work up for this.  Even if he has osteo, nothing surgical for this.  It would be abx therapy. We will follow for now and await IR eval.  He is on Eliquis  so they may not be able to proceed for a couple of days.   FEN - heart healthy VTE - Eliquis  ID - Merrem   Pericarditis ESRD A fib H/O UC Hypotension  I reviewed Consultant cardio notes, hospitalist notes, last 24 h vitals and pain scores, last 48 h intake and output, last 24 h labs and trends, and last 24 h imaging results.  Leone Ralphs, [facility] 11/10/2023, 10:39 AM Please see Amion for pager number during day hours 7:00am-4:30pm or 7:00am -11:30am on weekends

## 2023-11-10 NOTE — Progress Notes (Signed)
*  PRELIMINARY RESULTS* Echocardiogram 2D Echocardiogram has been performed.  Joshua Riley 11/10/2023, 11:54 AM

## 2023-11-10 NOTE — Progress Notes (Signed)
 PROGRESS NOTE    Joshua Riley  QMV:[id number] DOB: 1936/04/30 DOA: 11/08/2023 PCP: Almira Jaeger, MD   Brief Narrative: 88 year old with past medical history significant for ESRD on hemodialysis MWF with, paroxysmal A-fib, depression, obstructive uropathy with chronic left-sided percutaneous nephrostomy, ulcerative colitis status post colectomy 2011 with ileostomy, ileal pouch, Annelle anastomosis surgery 2012, chronic hypotension, anemia, history of DVT, sepsis, groin abscess recently admitted on March for abdominal pain found to have a small bowel obstruction presented to [facility] the day prior to admission complaining of chest pain, heaviness started 1 hour prior to arrival.  During evaluation in the ED troponin were elevated 428, EKG showed A-fib right bundle branch block.  Cardiology was consulted and think patient has pericarditis.  Patient was started on ibuprofen.  Patient was also found to have presacral peripherally enhancing fluid collection slightly increased in size compared to 09/06/2023 now measuring 4.7 x 5.5 cm.  Fluid collection extend into the left left ischial and and off also.  The presacral fluid collection abuts the anterior cortex of the sacrum which demonstrated mild irregularity suspicious for osteomyelitis.  Assessment & Plan:   Principal Problem:   Chest pain   1-Chest pain, concern for Acute Pericarditis Patient presented with chest pain and elevation of troponin.  Cardiology consulted and started patient on ibuprofen to treat for pericarditis - Follow echocardiogram - Chest pain-free today. - Added Protonix  for GI prophylaxis while on ibuprofen  Presacral fluid collection , sacral Osteomyelitis.  - Patient had history of the same, he has had multiple drain placed.  Due to previously does not think patient is a candidate for surgery.  He completed 6 weeks of antibiotics January 20 25th -CT showed worsening fluid collection, general surgery has been  consulted -Started on IV meropenem  and vancomycin  -Blood cultures no growth over the last 12 hours - Previous culture with ESBL E. coli  Orthostatic hypotension chronic - Monitor ostomy output - IV albumin  ordered systolic blood pressure in the 80s patient currently asymptomatic - Continue with midodrine  - Treat infectious process - Check cortisol level in the morning     Dyspnea: Chronic problem on and off, suspect some deconditioning and chronic hypotension ECHO pending.  Pulmonologist recommended Dulera  and albuterol  as needed  ESRD on hemodialysis MWF: Continue with midodrine  Nephrology consulted for hemodialysis  Anemia of chronic kidney disease -Continue to monitor hemoglobin  History of A-fib - Change Eliquis  to heparin  in anticipation for presacral tube collection drainage  History of ulcerative colitis Status post colectomy 2011 with ileostomy, ileal pouch anal anastomosis surgery 2012 History of SBO Currently having output from ostomy bag Loose stool, C. difficile order   History of obstructive uropathy with chronic left PCN in place.  Abdominal pain: D/d include from cholelithiasis or vascular , pt does have severe narrowing at the celiac origin secondary to calcified atherosclerotic plaque along with a calcified plaque at the SMA without hemodynamically significant stenosis.  Patient also has surgical grafts in the left common femoral artery and vein in the subcutaneous ventral left thigh. right upper quadrant ultrasound:  Cholelithiasis. Gallbladder wall thickening.  Fatty infiltration of the liver. Watery stool, check for C diff.  Further evaluation depending on symptoms and finding.   Irregular asymmetric left bladder wall thickening similar to prior. Cystitis versus malignancy. Out patent follow up   Estimated body mass index is 23.06 kg/m as calculated from the following:   Height as of this encounter: 5\' 8"  (1.727 m).   Weight as of this  encounter:  68.8 kg.   DVT prophylaxis: heparin   Code Status: full code Family Communication: daughter at bedside Disposition Plan:  Status is: Inpatient Remains inpatient appropriate because: management of infection, hypotension    Consultants:  Cardiology Surgery Nephrology   Procedures:  ECHO  Antimicrobials:    Subjective: Denies chest pain today, reports worsening shortness of breath especially on ambulation.  Having output from ostomy  Objective: Vitals:   11/10/23 0312 11/10/23 0321 11/10/23 0400 11/10/23 0505  BP: (!) 83/47 (!) 81/52 (!) 95/55 106/79  Pulse:      Resp: 17 18 20 18   Temp:   97.6 F (36.4 C)   TempSrc:   Oral   SpO2:   93%   Weight:      Height:        Intake/Output Summary (Last 24 hours) at 11/10/2023 0738 Last data filed at 11/10/2023 0651 Gross per 24 hour  Intake 592.48 ml  Output 670 ml  Net -77.52 ml   Filed Weights   11/09/23 1700  Weight: 68.8 kg    Examination:  General exam: Appears calm and comfortable  Respiratory system: Clear to auscultation. Respiratory effort normal. Cardiovascular system: S1 & S2 heard, RRR. Gastrointestinal system: Abdomen is nondistended, soft and nontender.  Ileostomy in place with bag full with liquid stool Central nervous system: Alert and oriented.  Extremities: Symmetric 5 x 5 power.   Data Reviewed: I have personally reviewed following labs and imaging studies  CBC: Recent Labs  Lab 11/08/23 2240 11/10/23 0347  WBC 15.8* 13.2*  HGB 9.8* 9.2*  HCT 29.0* 27.2*  MCV 116.5* 115.3*  PLT 197 226   Basic Metabolic Panel: Recent Labs  Lab 11/08/23 2240 11/09/23 1804  NA 136 132*  K 3.7 4.4  CL 92* 94*  CO2 25 20*  GLUCOSE 167* 137*  BUN 67* 78*  CREATININE 10.10* 11.24*  CALCIUM 10.2 8.9  MG  --  2.2   GFR: Estimated Creatinine Clearance: 4.6 mL/min (A) (by C-G formula based on SCr of 11.24 mg/dL (H)). Liver Function Tests: Recent Labs  Lab 11/09/23 1804  AST 16  ALT 32  ALKPHOS  77  BILITOT 1.1  PROT 6.5  ALBUMIN  2.6*   No results for input(s): "LIPASE", "AMYLASE" in the last 168 hours. No results for input(s): "AMMONIA" in the last 168 hours. Coagulation Profile: No results for input(s): "INR", "PROTIME" in the last 168 hours. Cardiac Enzymes: No results for input(s): "CKTOTAL", "CKMB", "CKMBINDEX", "TROPONINI" in the last 168 hours. BNP (last 3 results) No results for input(s): "PROBNP" in the last 8760 hours. HbA1C: Recent Labs    11/09/23 2152  HGBA1C 5.9*   CBG: No results for input(s): "GLUCAP" in the last 168 hours. Lipid Profile: Recent Labs    11/10/23 0347  CHOL 76  HDL 25*  LDLCALC 15  TRIG 578*  CHOLHDL 3.0   Thyroid Function Tests: No results for input(s): "TSH", "T4TOTAL", "FREET4", "T3FREE", "THYROIDAB" in the last 72 hours. Anemia Panel: Recent Labs    11/09/23 1804  VITAMINB12 5,684*   Sepsis Labs: No results for input(s): "PROCALCITON", "LATICACIDVEN" in the last 168 hours.  No results found for this or any previous visit (from the past 240 hours).       Radiology Studies: US  Abdomen Limited RUQ (LIVER/GB) Result Date: 11/09/2023 CLINICAL DATA:  [id number] Abdominal pain [id number] Cholelithiases 92642 EXAM: ULTRASOUND ABDOMEN LIMITED RIGHT UPPER QUADRANT COMPARISON:  None Available. FINDINGS: Gallbladder: Gallbladder wall thickened. Shadowing stone at the  fundus. No pericholecystic fluid. Common bile duct: Diameter: 0.7 cm. Liver: Liver is hyperechoic consistent with fatty infiltration. No focal hepatic lesions identified. No intrahepatic ductal dilatation. Hepatopetal portal vein flow. IMPRESSION: 1. Cholelithiasis. Gallbladder wall thickening. 2. Fatty infiltration of the liver. Electronically Signed   By: Sydell Eva M.D.   On: 11/09/2023 22:15   CT Angio Chest/Abd/Pel for Dissection W and/or Wo Contrast Result Date: 11/09/2023 CLINICAL DATA:  Acute aortic syndrome suspected. Chest pain and shortness of breath.  EXAM: CT ANGIOGRAPHY CHEST, ABDOMEN AND PELVIS TECHNIQUE: Non-contrast CT of the chest was initially obtained. Multidetector CT imaging through the chest, abdomen and pelvis was performed using the standard protocol during bolus administration of intravenous contrast. Multiplanar reconstructed images and MIPs were obtained and reviewed to evaluate the vascular anatomy. RADIATION DOSE REDUCTION: This exam was performed according to the departmental dose-optimization program which includes automated exposure control, adjustment of the mA and/or kV according to patient size and/or use of iterative reconstruction technique. CONTRAST:  OMNIPAQUE  IOHEXOL  350 MG/ML SOLN COMPARISON:  Chest radiograph 11/08/2023; CT abdomen pelvis 09/06/2023; CT chest 08/18/2023 FINDINGS: CTA CHEST FINDINGS Cardiovascular: No intramural hematoma, penetrating atherosclerotic ulcer, or dissection. Normal caliber thoracic aorta. Aortic and coronary artery atherosclerotic calcification. Right subclavian vein stent. Mediastinum/Nodes: Trachea is patent. Unremarkable thyroid and esophagus. No lymphadenopathy. Lungs/Pleura: Lower lobe atelectasis and scarring. Otherwise no focal consolidation. No pleural effusion or pneumothorax. 7 mm right lower lobe nodule on series 7/image 103 is unchanged. Musculoskeletal: No acute fracture. Review of the MIP images confirms the above findings. CTA ABDOMEN AND PELVIS FINDINGS VASCULAR Aorta: Calcified atherosclerotic plaque throughout the abdominal aorta without hemodynamically significant stenosis. No aneurysm or dissection. Celiac: Severe narrowing at the origin secondary to calcified atherosclerotic plaque. No aneurysm or dissection. SMA: Calcified plaque without hemodynamically significant stenosis. No aneurysm or dissection. Renals: Calcified plaque at the origin of the left renal artery. No hemodynamically significant stenosis. No aneurysm or dissection. IMA: Not visualized and likely occluded at  the origin. Inflow: Redemonstrated surgical grafts arising from the left common femoral artery and vein extending inferiorly in the subcutaneous ventral left thigh. No hemodynamically significant stenosis. No aneurysm or dissection. Veins: No obvious acute venous abnormality within the limitations of this arterial phase study. Review of the MIP images confirms the above findings. NON-VASCULAR Hepatobiliary: Cholelithiasis. No evidence of acute cholecystitis. Unremarkable liver and biliary tree. Pancreas: Unremarkable. Spleen: Unremarkable. Adrenals/Urinary Tract: Normal adrenal glands. Atrophic native kidneys with multiple small hypoattenuating lesions statistically likely to represent cysts. No follow-up recommended. Percutaneous nephrostomy tube in the left kidney. No urinary calculi or hydronephrosis. Irregular asymmetric left wall thickening about the nondistended bladder. 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 Herniation of a nonobstructed loop of small bowel into the parastomal hernia. No bowel wall thickening or obstruction. Stomach is within normal limits. Lymphatic: No lymphadenopathy. Reproductive: No acute abnormality.  Penile prosthesis. Other: The presacral peripherally enhancing fluid collection has slightly increased in size compared to 09/06/2023 now measuring 4.7 x 5.5 cm in the axial plane, previously 3.7 x 5.5 cm. Extension of the fluid collection into the left ischial anal fossa. Mild adjacent stranding. No free intraperitoneal air. Musculoskeletal: No acute fracture. The presacral fluid collection abuts the anterior cortex of the sacrum which demonstrates mild irregularity suspicious for osteomyelitis. 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 Review of the MIP images confirms the above findings. IMPRESSION: 1. No acute aortic syndrome. 2. The presacral fluid collection has slightly  increased in size compared to 09/06/2023.  Extension of the fluid collection into the left ischioanal fossa. The presacral fluid collection abuts the anterior cortex of the sacrum which demonstrates mild irregularity suspicious for osteomyelitis. 3. Irregular asymmetric left bladder wall thickening similar to prior. Cystitis versus malignancy. 4. Cholelithiasis. 5. Stable 7 mm right lower lobe nodule. Electronically Signed   By: Rozell Cornet M.D.   On: 11/09/2023 00:56   DG Chest 2 View Result Date: 11/08/2023 CLINICAL DATA:  Shortness of breath and chest pain EXAM: CHEST - 2 VIEW COMPARISON:  Radiograph 07/07/2023 FINDINGS: Stable cardiomediastinal silhouette. Calcified tortuous aorta. Low lung volumes accentuate pulmonary vascularity. Chronic bronchitic changes. Left basilar atelectasis. No pleural effusion or pneumothorax. Right subclavian vascular stent. IMPRESSION: Chronic bronchitic changes.  Left basilar atelectasis/scarring. Electronically Signed   By: Rozell Cornet M.D.   On: 11/08/2023 23:06        Scheduled Meds:  apixaban   2.5 mg Oral BID   atorvastatin  20 mg Oral Daily   Chlorhexidine  Gluconate Cloth  6 each Topical Q0600   ibuprofen  800 mg Oral TID   midodrine   15 mg Oral TID   thiamine (VITAMIN B1) injection  100 mg Intravenous Daily   Continuous Infusions:  meropenem  (MERREM ) IV     [START ON 11/11/2023] vancomycin        LOS: 1 day    Time spent: 35 minutes    Jaleal Schliep A Prisma Decarlo, MD [facility]   If 7PM-7AM, please contact night-coverage [email or web address]  11/10/2023, 7:38 AM

## 2023-11-11 ENCOUNTER — Inpatient Hospital Stay (HOSPITAL_COMMUNITY)

## 2023-11-11 DIAGNOSIS — I4819 Other persistent atrial fibrillation: Secondary | ICD-10-CM

## 2023-11-11 DIAGNOSIS — I309 Acute pericarditis, unspecified: Secondary | ICD-10-CM | POA: Diagnosis not present

## 2023-11-11 DIAGNOSIS — Z936 Other artificial openings of urinary tract status: Secondary | ICD-10-CM

## 2023-11-11 DIAGNOSIS — R079 Chest pain, unspecified: Secondary | ICD-10-CM | POA: Diagnosis not present

## 2023-11-11 DIAGNOSIS — N189 Chronic kidney disease, unspecified: Secondary | ICD-10-CM

## 2023-11-11 DIAGNOSIS — M8618 Other acute osteomyelitis, other site: Secondary | ICD-10-CM

## 2023-11-11 DIAGNOSIS — K610 Anal abscess: Secondary | ICD-10-CM | POA: Diagnosis not present

## 2023-11-11 DIAGNOSIS — I951 Orthostatic hypotension: Secondary | ICD-10-CM

## 2023-11-11 DIAGNOSIS — Z992 Dependence on renal dialysis: Secondary | ICD-10-CM

## 2023-11-11 DIAGNOSIS — M4628 Osteomyelitis of vertebra, sacral and sacrococcygeal region: Secondary | ICD-10-CM

## 2023-11-11 DIAGNOSIS — R0609 Other forms of dyspnea: Secondary | ICD-10-CM

## 2023-11-11 DIAGNOSIS — N186 End stage renal disease: Secondary | ICD-10-CM

## 2023-11-11 DIAGNOSIS — Z862 Personal history of diseases of the blood and blood-forming organs and certain disorders involving the immune mechanism: Secondary | ICD-10-CM

## 2023-11-11 DIAGNOSIS — K51919 Ulcerative colitis, unspecified with unspecified complications: Secondary | ICD-10-CM

## 2023-11-11 LAB — CBC
HCT: 24.9 % — ABNORMAL LOW (ref 39.0–52.0)
HCT: 28.2 % — ABNORMAL LOW (ref 39.0–52.0)
Hemoglobin: 8.4 g/dL — ABNORMAL LOW (ref 13.0–17.0)
Hemoglobin: 9.3 g/dL — ABNORMAL LOW (ref 13.0–17.0)
MCH: 38.3 pg — ABNORMAL HIGH (ref 26.0–34.0)
MCH: 38.9 pg — ABNORMAL HIGH (ref 26.0–34.0)
MCHC: 33 g/dL (ref 30.0–36.0)
MCHC: 33.7 g/dL (ref 30.0–36.0)
MCV: 115.3 fL — ABNORMAL HIGH (ref 80.0–100.0)
MCV: 116 fL — ABNORMAL HIGH (ref 80.0–100.0)
Platelets: 204 10*3/uL (ref 150–400)
Platelets: 204 10*3/uL (ref 150–400)
RBC: 2.16 MIL/uL — ABNORMAL LOW (ref 4.22–5.81)
RBC: 2.43 MIL/uL — ABNORMAL LOW (ref 4.22–5.81)
RDW: 15.4 % (ref 11.5–15.5)
RDW: 15.5 % (ref 11.5–15.5)
WBC: 10.6 10*3/uL — ABNORMAL HIGH (ref 4.0–10.5)
WBC: 9.7 10*3/uL (ref 4.0–10.5)
nRBC: 0 % (ref 0.0–0.2)
nRBC: 0 % (ref 0.0–0.2)

## 2023-11-11 LAB — HEPATITIS B SURFACE ANTIBODY, QUANTITATIVE: Hep B S AB Quant (Post): 13.7 m[IU]/mL

## 2023-11-11 LAB — BASIC METABOLIC PANEL WITH GFR
Anion gap: 18 — ABNORMAL HIGH (ref 5–15)
BUN: 49 mg/dL — ABNORMAL HIGH (ref 8–23)
CO2: 23 mmol/L (ref 22–32)
Calcium: 9.1 mg/dL (ref 8.9–10.3)
Chloride: 92 mmol/L — ABNORMAL LOW (ref 98–111)
Creatinine, Ser: 7.34 mg/dL — ABNORMAL HIGH (ref 0.61–1.24)
GFR, Estimated: 7 mL/min — ABNORMAL LOW (ref >60)
Glucose, Bld: 89 mg/dL (ref 70–99)
Potassium: 4.2 mmol/L (ref 3.5–5.1)
Sodium: 133 mmol/L — ABNORMAL LOW (ref 135–145)

## 2023-11-11 LAB — APTT
aPTT: 35 s (ref 24–36)
aPTT: 35 s (ref 24–36)

## 2023-11-11 LAB — HEPARIN LEVEL (UNFRACTIONATED): Heparin Unfractionated: >1.1 [IU]/mL — ABNORMAL HIGH (ref 0.30–0.70)

## 2023-11-11 LAB — PROTIME-INR
INR: 1.3 — ABNORMAL HIGH (ref 0.8–1.2)
Prothrombin Time: 16.3 s — ABNORMAL HIGH (ref 11.4–15.2)

## 2023-11-11 LAB — CORTISOL: Cortisol, Plasma: 12.8 ug/dL

## 2023-11-11 MED ORDER — LIDOCAINE HCL 1 % IJ SOLN
10.0000 mL | Freq: Once | INTRAMUSCULAR | Status: AC
  Administered 2023-11-11: 10 mL via INTRADERMAL
  Filled 2023-11-11: qty 10

## 2023-11-11 MED ORDER — DOCUSATE SODIUM 283 MG RE ENEM: 1.0000 | ENEMA | RECTAL | Status: DC | PRN

## 2023-11-11 MED ORDER — NEPRO/CARBSTEADY PO LIQD
237.0000 mL | Freq: Three times a day (TID) | ORAL | Status: DC | PRN
  Administered 2023-11-13: 237 mL via ORAL

## 2023-11-11 MED ORDER — CAMPHOR-MENTHOL 0.5-0.5 % EX LOTN: TOPICAL_LOTION | CUTANEOUS | Status: DC | PRN

## 2023-11-11 MED ORDER — HYDROXYZINE HCL 25 MG PO TABS
25.0000 mg | ORAL_TABLET | Freq: Three times a day (TID) | ORAL | Status: DC | PRN
  Administered 2023-11-11 (×2): 25 mg via ORAL
  Filled 2023-11-11 (×2): qty 1

## 2023-11-11 MED ORDER — SODIUM CHLORIDE 0.9% FLUSH
5.0000 mL | Freq: Three times a day (TID) | INTRAVENOUS | Status: DC
  Administered 2023-11-11 – 2023-11-13 (×7): 5 mL

## 2023-11-11 MED ORDER — DIPHENHYDRAMINE HCL 50 MG/ML IJ SOLN
INTRAMUSCULAR | Status: AC
  Filled 2023-11-11: qty 1

## 2023-11-11 MED ORDER — VANCOMYCIN VARIABLE DOSE PER UNSTABLE RENAL FUNCTION (PHARMACIST DOSING): Status: DC

## 2023-11-11 MED ORDER — CALCIUM CARBONATE ANTACID 1250 MG/5ML PO SUSP: 500.0000 mg | Freq: Four times a day (QID) | ORAL | Status: DC | PRN

## 2023-11-11 MED ORDER — SORBITOL 70 % SOLN: 30.0000 mL | Status: DC | PRN

## 2023-11-11 MED ORDER — HEPARIN (PORCINE) 25000 UT/250ML-% IV SOLN
1250.0000 [IU]/h | INTRAVENOUS | Status: DC
  Administered 2023-11-11: 1050 [IU]/h via INTRAVENOUS
  Filled 2023-11-11: qty 250

## 2023-11-11 MED ORDER — FENTANYL CITRATE (PF) 100 MCG/2ML IJ SOLN
INTRAMUSCULAR | Status: AC
  Filled 2023-11-11: qty 4

## 2023-11-11 MED ORDER — MIDAZOLAM HCL 2 MG/2ML IJ SOLN
INTRAMUSCULAR | Status: AC | PRN
  Administered 2023-11-11: 1 mg via INTRAVENOUS

## 2023-11-11 MED ORDER — MIDAZOLAM HCL 2 MG/2ML IJ SOLN
INTRAMUSCULAR | Status: AC
  Filled 2023-11-11: qty 4

## 2023-11-11 MED ORDER — FENTANYL CITRATE (PF) 100 MCG/2ML IJ SOLN
INTRAMUSCULAR | Status: AC | PRN
  Administered 2023-11-11: 50 ug via INTRAVENOUS

## 2023-11-11 MED ORDER — LOPERAMIDE HCL 2 MG PO CAPS: 2.0000 mg | ORAL_CAPSULE | ORAL | Status: DC | PRN

## 2023-11-11 MED ORDER — SODIUM CHLORIDE 0.9 % IV BOLUS
250.0000 mL | Freq: Once | INTRAVENOUS | Status: AC
  Administered 2023-11-11: 250 mL via INTRAVENOUS

## 2023-11-11 NOTE — Procedures (Signed)
 Interventional Radiology Procedure Note  Procedure: Image guided left trans-gluteal drain placement, presacral.  49F pigtail drain.  Complications: None  EBL: None Sample: Culture sent  Recommendations: - Routine drain care, with sterile flushes, record output - follow up Cx - routine wound care - ok to advance diet per primary - OK to restart AC now - 1 hr mod sed recovery, then ambulate per primary order  Signed,  Marciano Settles. Mabel Savage, DO

## 2023-11-11 NOTE — Progress Notes (Signed)
 Progress Note  Patient Name: Joshua Riley Date of Encounter: 11/11/2023  Primary Cardiologist:   Eilleen Grates, MD   Subjective   Denies chest pain.  He does not want to eat a renal diet.  He says "I am 87 and I want to eat a potato."  Inpatient Medications    Scheduled Meds:  (feeding supplement) PROSource Plus  30 mL Oral TID BM   atorvastatin  20 mg Oral Daily   [START ON 11/12/2023] calcitRIOL   0.5 mcg Oral Q T,Th,Sa-HD   Chlorhexidine  Gluconate Cloth  6 each Topical Q0600   darbepoetin (ARANESP ) injection - DIALYSIS  60 mcg Subcutaneous Q Tue-1800   dorzolamide   1 drop Right Eye QHS   ibuprofen  800 mg Oral TID   midodrine   15 mg Oral TID   pantoprazole   40 mg Oral BID   sevelamer  carbonate  1.6 g Oral TID WC   thiamine (VITAMIN B1) injection  100 mg Intravenous Daily   timolol   1 drop Both Eyes BID   Continuous Infusions:  meropenem  (MERREM ) IV 1 g (11/10/23 2155)   vancomycin      PRN Meds: acetaminophen , albuterol , calcium carbonate (dosed in mg elemental calcium), camphor-menthol , docusate sodium , feeding supplement (NEPRO CARB STEADY), hydrOXYzine , loperamide, nitroGLYCERIN, ondansetron  (ZOFRAN ) IV, mouth rinse, polyethylene glycol, sorbitol    Vital Signs    Vitals:   11/11/23 0101 11/11/23 0511 11/11/23 0736 11/11/23 0800  BP: (!) 89/52 (!) 86/48 (!) 89/56   Pulse: 63 62 65 75  Resp: 16 16 18    Temp: (!) 97.5 F (36.4 C) 98 F (36.7 C) (!) 97.5 F (36.4 C)   TempSrc: Oral Oral Oral   SpO2: 100% 94% 100% 100%  Weight:  68.6 kg    Height:        Intake/Output Summary (Last 24 hours) at 11/11/2023 0919 Last data filed at 11/11/2023 0856 Gross per 24 hour  Intake 499.4 ml  Output 1700 ml  Net -1200.6 ml   Filed Weights   11/10/23 1822 11/10/23 1830 11/11/23 0511  Weight: 70 kg 70 kg 68.6 kg    Telemetry     ECG    - Personally Reviewed  Physical Exam   GEN: No acute distress.  Neck: No  JVD Cardiac: RRR, no murmurs, rubs, or gallops.   Respiratory: Clear  to auscultation bilaterally. GI: Soft, nontender, non-distended  MS: No  edema; No deformity. Neuro:  Nonfocal  Psych: Normal affect   Labs    Chemistry Recent Labs  Lab 11/09/23 1804 11/10/23 1148 11/11/23 0706  NA 132* 131* 133*  K 4.4 4.2 4.2  CL 94* 93* 92*  CO2 20* 19* 23  GLUCOSE 137* 102* 89  BUN 78* 95* 49*  CREATININE 11.24* 11.83* 7.34*  CALCIUM 8.9 9.1 9.1  PROT 6.5  --   --   ALBUMIN  2.6* 2.9*  --   AST 16  --   --   ALT 32  --   --   ALKPHOS 77  --   --   BILITOT 1.1  --   --   GFRNONAA 4* 4* 7*  ANIONGAP 18* 19* 18*     Hematology Recent Labs  Lab 11/10/23 0347 11/11/23 0005 11/11/23 0706  WBC 13.2* 9.7 10.6*  RBC 2.36* 2.16* 2.43*  HGB 9.2* 8.4* 9.3*  HCT 27.2* 24.9* 28.2*  MCV 115.3* 115.3* 116.0*  MCH 39.0* 38.9* 38.3*  MCHC 33.8 33.7 33.0  RDW 15.7* 15.5 15.4  PLT 226 204  204    Cardiac EnzymesNo results for input(s): "TROPONINI" in the last 168 hours. No results for input(s): "TROPIPOC" in the last 168 hours.   BNPNo results for input(s): "BNP", "PROBNP" in the last 168 hours.   DDimer No results for input(s): "DDIMER" in the last 168 hours.   Radiology    ECHOCARDIOGRAM COMPLETE Result Date: 11/10/2023    ECHOCARDIOGRAM REPORT   Patient Name:   Joshua Riley Date of Exam: 11/10/2023 Medical Rec #:  [id number]       Height:       68.0 in Accession #:    [id number]      Weight:       151.7 lb Date of Birth:  May 25, 1936       BSA:          1.817 m Patient Age:    87 years        BP:           89/59 mmHg Patient Gender: M               HR:           58 bpm. Exam Location:  Inpatient Procedure: 2D Echo, Color Doppler and Cardiac Doppler (Both Spectral and Color            Flow Doppler were utilized during procedure). Indications:    R07.9* Chest pain, unspecified  History:        Patient has prior history of Echocardiogram examinations, most                 recent 05/27/2022.  Sonographer:    Andrena Bang Referring Phys:  CALLIE E GOODRICH IMPRESSIONS  1. Left ventricular ejection fraction, by estimation, is 55 to 60%. The left ventricle has normal function. The left ventricle has no regional wall motion abnormalities. Left ventricular diastolic parameters are consistent with Grade I diastolic dysfunction (impaired relaxation).  2. Right ventricular systolic function is normal. The right ventricular size is moderately enlarged.  3. The mitral valve is normal in structure. No evidence of mitral valve regurgitation. No evidence of mitral stenosis.  4. Tricuspid valve regurgitation is moderate.  5. The aortic valve is calcified. There is mild calcification of the aortic valve. There is mild thickening of the aortic valve. Aortic valve regurgitation is mild. Aortic valve sclerosis is present, with no evidence of aortic valve stenosis. Aortic valve mean gradient measures 4.0 mmHg. Aortic valve Vmax measures 1.46 m/s.  6. Aorta measured 40 mm on CT chest 11/09/2023. Aortic dilatation noted. There is mild dilatation of the ascending aorta, measuring 40 mm.  7. The inferior vena cava is normal in size with greater than 50% respiratory variability, suggesting right atrial pressure of 3 mmHg. FINDINGS  Left Ventricle: Left ventricular ejection fraction, by estimation, is 55 to 60%. The left ventricle has normal function. The left ventricle has no regional wall motion abnormalities. Definity contrast agent was given IV to delineate the left ventricular  endocardial borders. The left ventricular internal cavity size was normal in size. There is no left ventricular hypertrophy. Left ventricular diastolic parameters are consistent with Grade I diastolic dysfunction (impaired relaxation). Right Ventricle: The right ventricular size is moderately enlarged. No increase in right ventricular wall thickness. Right ventricular systolic function is normal. Left Atrium: Left atrial size was normal in size. Right Atrium: Right atrial size was normal in size.  Pericardium: There is no evidence of pericardial effusion. Mitral Valve: The mitral valve is normal in  structure. No evidence of mitral valve regurgitation. No evidence of mitral valve stenosis. MV peak gradient, 5.7 mmHg. The mean mitral valve gradient is 3.0 mmHg. Tricuspid Valve: The tricuspid valve is normal in structure. Tricuspid valve regurgitation is moderate . No evidence of tricuspid stenosis. Aortic Valve: The aortic valve is calcified. There is mild calcification of the aortic valve. There is mild thickening of the aortic valve. Aortic valve regurgitation is mild. Aortic valve sclerosis is present, with no evidence of aortic valve stenosis. Aortic valve mean gradient measures 4.0 mmHg. Aortic valve peak gradient measures 8.5 mmHg. Aortic valve area, by VTI measures 2.72 cm. Pulmonic Valve: The pulmonic valve was normal in structure. Pulmonic valve regurgitation is trivial. No evidence of pulmonic stenosis. Aorta: Aorta measured 40 mm on CT chest 11/09/2023. Aortic dilatation noted. There is mild dilatation of the ascending aorta, measuring 40 mm. Venous: The inferior vena cava is normal in size with greater than 50% respiratory variability, suggesting right atrial pressure of 3 mmHg. IAS/Shunts: No atrial level shunt detected by color flow Doppler.  LEFT VENTRICLE PLAX 2D LVIDd:         4.60 cm      Diastology LVIDs:         3.00 cm      LV e' medial:    7.29 cm/s LV PW:         1.40 cm      LV E/e' medial:  16.7 LV IVS:        0.70 cm      LV e' lateral:   9.03 cm/s LVOT diam:     2.20 cm      LV E/e' lateral: 13.5 LV SV:         89 LV SV Index:   49 LVOT Area:     3.80 cm  LV Volumes (MOD) LV vol d, MOD A2C: 119.0 ml LV vol d, MOD A4C: 151.0 ml LV vol s, MOD A2C: 38.3 ml LV vol s, MOD A4C: 61.0 ml LV SV MOD A2C:     80.7 ml LV SV MOD A4C:     151.0 ml LV SV MOD BP:      85.0 ml RIGHT VENTRICLE RV S prime:     10.90 cm/s TAPSE (M-mode): 1.9 cm LEFT ATRIUM           Index LA Vol (A2C): 91.9 ml 50.58  ml/m LA Vol (A4C): 85.2 ml 46.89 ml/m  AORTIC VALVE AV Area (Vmax):    2.92 cm AV Area (Vmean):   2.81 cm AV Area (VTI):     2.72 cm AV Vmax:           146.00 cm/s AV Vmean:          91.700 cm/s AV VTI:            0.328 m AV Peak Grad:      8.5 mmHg AV Mean Grad:      4.0 mmHg LVOT Vmax:         112.00 cm/s LVOT Vmean:        67.700 cm/s LVOT VTI:          0.235 m LVOT/AV VTI ratio: 0.72  AORTA Ao Asc diam: 4.60 cm MITRAL VALVE MV Area (PHT): 2.84 cm     SHUNTS MV Area VTI:   1.95 cm     Systemic VTI:  0.24 m MV Peak grad:  5.7 mmHg     Systemic Diam: 2.20 cm MV  Mean grad:  3.0 mmHg MV Vmax:       1.19 m/s MV Vmean:      75.2 cm/s MV Decel Time: 267 msec MV E velocity: 122.00 cm/s MV A velocity: 116.00 cm/s MV E/A ratio:  1.05 Dorothye Gathers MD Electronically signed by Dorothye Gathers MD Signature Date/Time: 11/10/2023/1:22:42 PM    Final    US  Abdomen Limited RUQ (LIVER/GB) Result Date: 11/09/2023 CLINICAL DATA:  [id number] Abdominal pain [id number] Cholelithiases 92642 EXAM: ULTRASOUND ABDOMEN LIMITED RIGHT UPPER QUADRANT COMPARISON:  None Available. FINDINGS: Gallbladder: Gallbladder wall thickened. Shadowing stone at the fundus. No pericholecystic fluid. Common bile duct: Diameter: 0.7 cm. Liver: Liver is hyperechoic consistent with fatty infiltration. No focal hepatic lesions identified. No intrahepatic ductal dilatation. Hepatopetal portal vein flow. IMPRESSION: 1. Cholelithiasis. Gallbladder wall thickening. 2. Fatty infiltration of the liver. Electronically Signed   By: Sydell Eva M.D.   On: 11/09/2023 22:15    Cardiac Studies   See echo above  Patient Profile     88 y.o. male with a history of normal coronaries on cardiac catheterization in 07/2022, persistent atrial fibrillation on Eliquis , prior CVA, prior DVT, hypotension on Midodrine  on dialysis days, ESRD on hemodialysis on M/W/F, obstructive uropathy s/p chronic left percutenaous nephrostomy, ulcerative colitis s/p colostomy in 2012, GI  bleed, and obstructive sleep apnea who is being seen 11/09/2023 for the evaluation of NSTEMI at the request of Dr. Lydia Sams.   Assessment & Plan    Chest pain:  Likely pericarditis.   Responded to NSAIDs.   I would suggest a 7 day course of tid Motrin and then taper off over another 3 - 5 days.  No other therapy.  No effusion echo.  Please call with further questions.     Persistent atrial fib:  Eliquis  currently on hold with drainage of presacral fluid collection.  On heparin .  Resume DOAC when able.  No ASA.    Hypotension:    Continue current meds.  BP remains low.  Fluid management per dialysis.     ESRD:  Per primary team.  Today is a dialysis yesteday.    We will follow as needed.   For questions or updates, please contact [facility] Please consult [email or web address] for contact info under Cardiology/STEMI.   Signed, Eilleen Grates, MD  11/11/2023, 9:19 AM

## 2023-11-11 NOTE — Progress Notes (Addendum)
 PHARMACY - ANTICOAGULATION CONSULT NOTE  Pharmacy Consult for Eliquis  > Heparin  Indication: chest pain/ACS, hx of Afib  Allergies  Allergen Reactions   Cephalosporins Rash   Ciprofloxacin Itching and Rash   Baclofen Other (See Comments)    Altered mental status, after accidental overdose     Vital Signs: Temp: 97.5 F (36.4 C) (05/07 0736) Temp Source: Oral (05/07 0736) BP: 89/56 (05/07 0736) Pulse Rate: 65 (05/07 0736) Heparin  dosing weight = 69 kg  Labs: Recent Labs    11/09/23 1804 11/10/23 0347 11/10/23 1148 11/11/23 0005 11/11/23 0706  HGB  --  9.2*  --  8.4* 9.3*  HCT  --  27.2*  --  24.9* 28.2*  PLT  --  226  --  204 204  APTT 42*  --   --   --  35  LABPROT  --   --   --  16.3*  --   INR  --   --   --  1.3*  --   HEPARINUNFRC >1.10*  --   --   --  >1.10*  CREATININE 11.24*  --  11.83*  --  7.34*    Estimated Creatinine Clearance: 7.1 mL/min (A) (by C-G formula based on SCr of 7.34 mg/dL (H)).   Medical History: Past Medical History:  Diagnosis Date   A-fib (HCC)    Anemia    Arthritis    Cancer (HCC)    Basal cell   COVID-19    2021   Dysrhythmia    Afib-controlled on eliquis    ESRD (end stage renal disease) (HCC) 10/22/2021   Glaucoma 11/18/2021   History of DVT (deep vein thrombosis)    Hydronephrosis    managed wtih a PCN   Idiopathic neuropathy 10/22/2021   lyrica     Ileostomy in place [facility])    Obstructive uropathy    With chronic left nephrostomy   Old retinal detachment, total or subtotal    Orthostatic hypotension 10/22/2021   Sleep apnea    does not need a machine   Stroke (HCC)    Ulcerative colitis (HCC)    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  Pt is on Apixaban  PTA for history of atrial fibrillation. Holding apixaban  and starting heparin  with plans for drain placement, last dose of apixaban  per MD was 5/6 AM.    Initial aPTT (35  sec) subtherapeutic on 850 units/hr.  Heparin  level > 1.1 reflective of recent DOAC use.  No infusion issues/interruptions per RN.  CBC stable - Hgb 9.3, pltc 204.   AM Addendum: Hold heparin  for drain per IR.  Discussed with IR, plan to restart heparin  4h after drain placed.  AM Update: s/p L trans-gluteal drain placement @1100 , no issues with procedure, IR ok to restart therapeutic heparin  now.  Goal of Therapy:  Heparin  level 0.3-0.7 units/ml aPTT 66-102 seconds Monitor platelets by anticoagulation protocol: Yes   Plan:  Start heparin  IV 1050 units/hr 8h aPTT/heparin  level Daily CBC, heparin  level, and aPTT until correlating Monitor for bleeding F/u to DOAC pending drain  Cecillia Cogan, PharmD Clinical Pharmacist 11/11/2023  8:11 AM

## 2023-11-11 NOTE — Progress Notes (Signed)
 PHARMACY - ANTICOAGULATION CONSULT NOTE  Pharmacy Consult for Eliquis  > Heparin  Indication: chest pain/ACS, hx of Afib  Allergies  Allergen Reactions   Cephalosporins Rash   Ciprofloxacin Itching and Rash   Baclofen Other (See Comments)    Altered mental status, after accidental overdose     Vital Signs: Temp: 98.3 F (36.8 C) (05/07 1921) Temp Source: Oral (05/07 1921) BP: 76/50 (05/07 1955) Pulse Rate: 61 (05/07 1955) Heparin  dosing weight = 69 kg  Labs: Recent Labs    11/09/23 1804 11/10/23 0347 11/10/23 1148 11/11/23 0005 11/11/23 0706 11/11/23 2041  HGB  --  9.2*  --  8.4* 9.3*  --   HCT  --  27.2*  --  24.9* 28.2*  --   PLT  --  226  --  204 204  --   APTT 42*  --   --   --  35 35  LABPROT  --   --   --  16.3*  --   --   INR  --   --   --  1.3*  --   --   HEPARINUNFRC >1.10*  --   --   --  >1.10*  --   CREATININE 11.24*  --  11.83*  --  7.34*  --     Estimated Creatinine Clearance: 7.1 mL/min (A) (by C-G formula based on SCr of 7.34 mg/dL (H)).   Medical History: Past Medical History:  Diagnosis Date   A-fib (HCC)    Anemia    Arthritis    Cancer (HCC)    Basal cell   COVID-19    2021   Dysrhythmia    Afib-controlled on eliquis    ESRD (end stage renal disease) (HCC) 10/22/2021   Glaucoma 11/18/2021   History of DVT (deep vein thrombosis)    Hydronephrosis    managed wtih a PCN   Idiopathic neuropathy 10/22/2021   lyrica     Ileostomy in place [facility])    Obstructive uropathy    With chronic left nephrostomy   Old retinal detachment, total or subtotal    Orthostatic hypotension 10/22/2021   Sleep apnea    does not need a machine   Stroke (HCC)    Ulcerative colitis (HCC)    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  Pt is on Apixaban  PTA for history of atrial fibrillation. Holding apixaban  and starting heparin  with plans for drain placement, last dose of  apixaban  per MD was 5/6 AM.    Heparin  resumed this afternoon after drain placement by IR. Repeat aPTT remains low. No infusion or bleeding concerns per RN.  Goal of Therapy:  Heparin  level 0.3-0.7 units/ml aPTT 66-102 seconds Monitor platelets by anticoagulation protocol: Yes   Plan:  Increase heparin  1250 units/h Repeat aPTT in 8h  Levin Reamer, PharmD, [place], [facility] Clinical Pharmacist [phone] Please check AMION for all [facility] Pharmacy numbers 11/11/2023

## 2023-11-11 NOTE — Plan of Care (Signed)
  Problem: Education: Goal: Knowledge of General Education information will improve Description: Including pain rating scale, medication(s)/side effects and non-pharmacologic comfort measures Outcome: Progressing   Problem: Health Behavior/Discharge Planning: Goal: Ability to manage health-related needs will improve Outcome: Progressing   Problem: Clinical Measurements: Goal: Ability to maintain clinical measurements within normal limits will improve Outcome: Progressing Goal: Will remain free from infection Outcome: Progressing Goal: Diagnostic test results will improve Outcome: Progressing Goal: Respiratory complications will improve Outcome: Progressing Goal: Cardiovascular complication will be avoided Outcome: Progressing   Problem: Activity: Goal: Risk for activity intolerance will decrease Outcome: Progressing   Problem: Nutrition: Goal: Adequate nutrition will be maintained Outcome: Progressing   Problem: Coping: Goal: Level of anxiety will decrease Outcome: Progressing   Problem: Elimination: Goal: Will not experience complications related to bowel motility Outcome: Progressing Goal: Will not experience complications related to urinary retention Outcome: Progressing   Problem: Pain Managment: Goal: General experience of comfort will improve and/or be controlled Outcome: Progressing   Problem: Safety: Goal: Ability to remain free from injury will improve Outcome: Progressing   Problem: Skin Integrity: Goal: Risk for impaired skin integrity will decrease Outcome: Progressing   Problem: Education: Goal: Understanding of cardiac disease, CV risk reduction, and recovery process will improve Outcome: Progressing Goal: Individualized Educational Video(s) Outcome: Progressing   Problem: Activity: Goal: Ability to tolerate increased activity will improve Outcome: Progressing   Problem: Cardiac: Goal: Ability to achieve and maintain adequate cardiovascular  perfusion will improve Outcome: Progressing   Problem: Health Behavior/Discharge Planning: Goal: Ability to safely manage health-related needs after discharge will improve Outcome: Progressing

## 2023-11-11 NOTE — Progress Notes (Signed)
 [facility] Progress Note   Subjective:   feels decent.  UF 1L yesterday.  Stool .   Surgery consulted yesterday due to presacral fluid collection - IR consulted for drain placement.  Objective Vitals:   11/11/23 0101 11/11/23 0511 11/11/23 0736 11/11/23 0800  BP: (!) 89/52 (!) 86/48 (!) 89/56   Pulse: 63 62 65 75  Resp: 16 16 18    Temp: (!) 97.5 F (36.4 C) 98 F (36.7 C) (!) 97.5 F (36.4 C)   TempSrc: Oral Oral Oral   SpO2: 100% 94% 100% 100%  Weight:  68.6 kg    Height:       General: Elderly male; frail; NAD; on RA Head: Sclera not icteric  Lungs: clear, No wheeze or rhonchi. Breathing is unlabored. Heart: RRR. No murmur, rubs or gallops.  Abdomen: soft and non-tender Lower extremities: No LE edema Neuro: AAOx3. Moves all extremities spontaneously. Dialysis Access: L thigh AVG (+) B/T  Additional Objective Labs: Basic Metabolic Panel: Recent Labs  Lab 11/09/23 1804 11/10/23 1148 11/11/23 0706  NA 132* 131* 133*  K 4.4 4.2 4.2  CL 94* 93* 92*  CO2 20* 19* 23  GLUCOSE 137* 102* 89  BUN 78* 95* 49*  CREATININE 11.24* 11.83* 7.34*  CALCIUM 8.9 9.1 9.1  PHOS  --  7.1*  --    Liver Function Tests: Recent Labs  Lab 11/09/23 1804 11/10/23 1148  AST 16  --   ALT 32  --   ALKPHOS 77  --   BILITOT 1.1  --   PROT 6.5  --   ALBUMIN  2.6* 2.9*   No results for input(s): "LIPASE", "AMYLASE" in the last 168 hours. CBC: Recent Labs  Lab 11/08/23 2240 11/10/23 0347 11/11/23 0005 11/11/23 0706  WBC 15.8* 13.2* 9.7 10.6*  HGB 9.8* 9.2* 8.4* 9.3*  HCT 29.0* 27.2* 24.9* 28.2*  MCV 116.5* 115.3* 115.3* 116.0*  PLT 197 226 204 204   Blood Culture    Component Value Date/Time   SDES BLOOD BLOOD RIGHT ARM 11/09/2023 2152   SDES BLOOD BLOOD RIGHT HAND 11/09/2023 2152   SPECREQUEST  11/09/2023 2152    BOTTLES DRAWN AEROBIC AND ANAEROBIC Blood Culture results may not be optimal due to an inadequate volume of blood received in culture bottles    SPECREQUEST  11/09/2023 2152    BOTTLES DRAWN AEROBIC AND ANAEROBIC Blood Culture results may not be optimal due to an inadequate volume of blood received in culture bottles   CULT  11/09/2023 2152    NO GROWTH 2 DAYS Performed at [facility], [address], [place], [place] [address]    CULT  11/09/2023 2152    NO GROWTH 2 DAYS Performed at [facility], [address], [place], [place] [address]    REPTSTATUS PENDING 11/09/2023 2152   REPTSTATUS PENDING 11/09/2023 2152    Cardiac Enzymes: No results for input(s): "CKTOTAL", "CKMB", "CKMBINDEX", "TROPONINI" in the last 168 hours. CBG: No results for input(s): "GLUCAP" in the last 168 hours. Iron Studies: No results for input(s): "IRON", "TIBC", "TRANSFERRIN", "FERRITIN" in the last 72 hours. @lablastinr3 @ Studies/Results: ECHOCARDIOGRAM COMPLETE Result Date: 11/10/2023    ECHOCARDIOGRAM REPORT   Patient Name:   Joshua Riley Date of Exam: 11/10/2023 Medical Rec #:  [id number]       Height:       68.0 in Accession #:    [id number]      Weight:       151.7 lb  Date of Birth:  [birth year]       BSA:          1.817 m Patient Age:    87 years        BP:           89/59 mmHg Patient Gender: M               HR:           58 bpm. Exam Location:  Inpatient Procedure: 2D Echo, Color Doppler and Cardiac Doppler (Both Spectral and Color            Flow Doppler were utilized during procedure). Indications:    R07.9* Chest pain, unspecified  History:        Patient has prior history of Echocardiogram examinations, most                 recent 05/27/2022.  Sonographer:    Andrena Bang Referring Phys: CALLIE E GOODRICH IMPRESSIONS  1. Left ventricular ejection fraction, by estimation, is 55 to 60%. The left ventricle has normal function. The left ventricle has no regional wall motion abnormalities. Left ventricular diastolic parameters are consistent with Grade I diastolic dysfunction (impaired relaxation).  2. Right ventricular systolic function is  normal. The right ventricular size is moderately enlarged.  3. The mitral valve is normal in structure. No evidence of mitral valve regurgitation. No evidence of mitral stenosis.  4. Tricuspid valve regurgitation is moderate.  5. The aortic valve is calcified. There is mild calcification of the aortic valve. There is mild thickening of the aortic valve. Aortic valve regurgitation is mild. Aortic valve sclerosis is present, with no evidence of aortic valve stenosis. Aortic valve mean gradient measures 4.0 mmHg. Aortic valve Vmax measures 1.46 m/s.  6. Aorta measured 40 mm on CT chest 11/09/2023. Aortic dilatation noted. There is mild dilatation of the ascending aorta, measuring 40 mm.  7. The inferior vena cava is normal in size with greater than 50% respiratory variability, suggesting right atrial pressure of 3 mmHg. FINDINGS  Left Ventricle: Left ventricular ejection fraction, by estimation, is 55 to 60%. The left ventricle has normal function. The left ventricle has no regional wall motion abnormalities. Definity contrast agent was given IV to delineate the left ventricular  endocardial borders. The left ventricular internal cavity size was normal in size. There is no left ventricular hypertrophy. Left ventricular diastolic parameters are consistent with Grade I diastolic dysfunction (impaired relaxation). Right Ventricle: The right ventricular size is moderately enlarged. No increase in right ventricular wall thickness. Right ventricular systolic function is normal. Left Atrium: Left atrial size was normal in size. Right Atrium: Right atrial size was normal in size. Pericardium: There is no evidence of pericardial effusion. Mitral Valve: The mitral valve is normal in structure. No evidence of mitral valve regurgitation. No evidence of mitral valve stenosis. MV peak gradient, 5.7 mmHg. The mean mitral valve gradient is 3.0 mmHg. Tricuspid Valve: The tricuspid valve is normal in structure. Tricuspid valve  regurgitation is moderate . No evidence of tricuspid stenosis. Aortic Valve: The aortic valve is calcified. There is mild calcification of the aortic valve. There is mild thickening of the aortic valve. Aortic valve regurgitation is mild. Aortic valve sclerosis is present, with no evidence of aortic valve stenosis. Aortic valve mean gradient measures 4.0 mmHg. Aortic valve peak gradient measures 8.5 mmHg. Aortic valve area, by VTI measures 2.72 cm. Pulmonic Valve: The pulmonic valve was normal in structure. Pulmonic valve  regurgitation is trivial. No evidence of pulmonic stenosis. Aorta: Aorta measured 40 mm on CT chest 11/09/2023. Aortic dilatation noted. There is mild dilatation of the ascending aorta, measuring 40 mm. Venous: The inferior vena cava is normal in size with greater than 50% respiratory variability, suggesting right atrial pressure of 3 mmHg. IAS/Shunts: No atrial level shunt detected by color flow Doppler.  LEFT VENTRICLE PLAX 2D LVIDd:         4.60 cm      Diastology LVIDs:         3.00 cm      LV e' medial:    7.29 cm/s LV PW:         1.40 cm      LV E/e' medial:  16.7 LV IVS:        0.70 cm      LV e' lateral:   9.03 cm/s LVOT diam:     2.20 cm      LV E/e' lateral: 13.5 LV SV:         89 LV SV Index:   49 LVOT Area:     3.80 cm  LV Volumes (MOD) LV vol d, MOD A2C: 119.0 ml LV vol d, MOD A4C: 151.0 ml LV vol s, MOD A2C: 38.3 ml LV vol s, MOD A4C: 61.0 ml LV SV MOD A2C:     80.7 ml LV SV MOD A4C:     151.0 ml LV SV MOD BP:      85.0 ml RIGHT VENTRICLE RV S prime:     10.90 cm/s TAPSE (M-mode): 1.9 cm LEFT ATRIUM           Index LA Vol (A2C): 91.9 ml 50.58 ml/m LA Vol (A4C): 85.2 ml 46.89 ml/m  AORTIC VALVE AV Area (Vmax):    2.92 cm AV Area (Vmean):   2.81 cm AV Area (VTI):     2.72 cm AV Vmax:           146.00 cm/s AV Vmean:          91.700 cm/s AV VTI:            0.328 m AV Peak Grad:      8.5 mmHg AV Mean Grad:      4.0 mmHg LVOT Vmax:         112.00 cm/s LVOT Vmean:        67.700 cm/s  LVOT VTI:          0.235 m LVOT/AV VTI ratio: 0.72  AORTA Ao Asc diam: 4.60 cm MITRAL VALVE MV Area (PHT): 2.84 cm     SHUNTS MV Area VTI:   1.95 cm     Systemic VTI:  0.24 m MV Peak grad:  5.7 mmHg     Systemic Diam: 2.20 cm MV Mean grad:  3.0 mmHg MV Vmax:       1.19 m/s MV Vmean:      75.2 cm/s MV Decel Time: 267 msec MV E velocity: 122.00 cm/s MV A velocity: 116.00 cm/s MV E/A ratio:  1.05 Dorothye Gathers MD Electronically signed by Dorothye Gathers MD Signature Date/Time: 11/10/2023/1:22:42 PM    Final    US  Abdomen Limited RUQ (LIVER/GB) Result Date: 11/09/2023 CLINICAL DATA:  [id number] Abdominal pain [id number] Cholelithiases 92642 EXAM: ULTRASOUND ABDOMEN LIMITED RIGHT UPPER QUADRANT COMPARISON:  None Available. FINDINGS: Gallbladder: Gallbladder wall thickened. Shadowing stone at the fundus. No pericholecystic fluid. Common bile duct: Diameter: 0.7 cm. Liver: Liver is hyperechoic consistent with fatty  infiltration. No focal hepatic lesions identified. No intrahepatic ductal dilatation. Hepatopetal portal vein flow. IMPRESSION: 1. Cholelithiasis. Gallbladder wall thickening. 2. Fatty infiltration of the liver. Electronically Signed   By: Sydell Eva M.D.   On: 11/09/2023 22:15   Medications:  meropenem  (MERREM ) IV 1 g (11/10/23 2155)   vancomycin       (feeding supplement) PROSource Plus  30 mL Oral TID BM   atorvastatin  20 mg Oral Daily   [START ON 11/12/2023] calcitRIOL   0.5 mcg Oral Q T,Th,Sa-HD   Chlorhexidine  Gluconate Cloth  6 each Topical Q0600   darbepoetin (ARANESP ) injection - DIALYSIS  60 mcg Subcutaneous Q Tue-1800   dorzolamide   1 drop Right Eye QHS   ibuprofen  800 mg Oral TID   midodrine   15 mg Oral TID   pantoprazole   40 mg Oral BID   sevelamer  carbonate  1.6 g Oral TID WC   thiamine (VITAMIN B1) injection  100 mg Intravenous Daily   timolol   1 drop Both Eyes BID    Dialysis Orders:  [facility]-MWF 2:45hrs EDW 71kg BFR 350, DFR Auto 1.5 2K/2.5Ca Heparin  No  bolus ordered Mircera 120 mcg q4wks (new order). 50mcg last given on 10/26/23 Calcitriol  0.5mcg PO qHD - last dose 11/06/23 Midodrine  15mg  TID Renvela  Powder 1600mg  TID with meals   Last Labs: Hgb 9.2,  K 4.2, Ca 9.1, P 7.1,  Alb 2.9   Assessment/Plan: Chest pain - Cardiology following; being treated for pericarditis with short course NSAIDs Recurrent presacral fluid collection - General surgery consulted - IR now consulted for drain placement.  On vanc and meropenem  currently.  D/w ID - plan is course of abx through 11/24/23.  Based on prior cultures will need ertapenem  1g post HD three times per week - messaged renal navigator to check w outpt unit to order.  May also need vanc x 2 weeks - ID will put in note.  Hd units typically have vanc in stock ESRD -  on HD MWF. Last HD 5/2. Plan for HD tomorrow off schedule - will resume outpt schedule at some point prior to d/c. Appears he's been leaving under EDW.  Chronic hypotension - continue Midodrine  15mg  TID Volume  - He's frail and appears he's been leaving under EDW at outpatient dialysis. Monitor closely. Will adjust EDW at discharge for accuracy Anemia of CKD - Hgb 9.2, resumed ESA here Secondary Hyperparathyroidism -  Ca okay but phos is high. Resume binders and VDRA Nutrition - Renal diet with fluid restriction. Will start protein supplements for low Alb  Adrian Alba MD 11/11/2023, 9:14 AM  [facility] Pager: [phone]

## 2023-11-11 NOTE — Progress Notes (Signed)
 Subjective: No new complaints today.  Awaiting drain placement in IR  ROS: See above, otherwise other systems negative  Objective: Vital signs in last 24 hours: Temp:  [97.5 F (36.4 C)-98.2 F (36.8 C)] 97.5 F (36.4 C) (05/07 0736) Pulse Rate:  [52-106] 75 (05/07 0800) Resp:  [14-22] 18 (05/07 0736) BP: (71-92)/(42-79) 89/56 (05/07 0736) SpO2:  [91 %-100 %] 100 % (05/07 0800) Weight:  [68.6 kg-70.5 kg] 68.6 kg (05/07 0511) Last BM Date : 11/11/23  Intake/Output from previous day: 05/06 0701 - 05/07 0700 In: 488.4 [P.O.:240; I.V.:65.8; IV Piggyback:182.6] Out: 1550 [Stool:550] Intake/Output this shift: Total I/O In: 11 [I.V.:11] Out: 150 [Stool:150]  PE: Gen: sitting in chair in NAD Lungs: respiratory effort nonlabored  Lab Results:  Recent Labs    11/11/23 0005 11/11/23 0706  WBC 9.7 10.6*  HGB 8.4* 9.3*  HCT 24.9* 28.2*  PLT 204 204   BMET Recent Labs    11/10/23 1148 11/11/23 0706  NA 131* 133*  K 4.2 4.2  CL 93* 92*  CO2 19* 23  GLUCOSE 102* 89  BUN 95* 49*  CREATININE 11.83* 7.34*  CALCIUM 9.1 9.1   PT/INR Recent Labs    11/11/23 0005  LABPROT 16.3*  INR 1.3*   CMP     Component Value Date/Time   NA 133 (L) 11/11/2023 0706   NA 138 04/10/2023 1541   K 4.2 11/11/2023 0706   CL 92 (L) 11/11/2023 0706   CO2 23 11/11/2023 0706   GLUCOSE 89 11/11/2023 0706   BUN 49 (H) 11/11/2023 0706   BUN 19 04/10/2023 1541   CREATININE 7.34 (H) 11/11/2023 0706   CALCIUM 9.1 11/11/2023 0706   PROT 6.5 11/09/2023 1804   ALBUMIN  2.9 (L) 11/10/2023 1148   AST 16 11/09/2023 1804   ALT 32 11/09/2023 1804   ALKPHOS 77 11/09/2023 1804   BILITOT 1.1 11/09/2023 1804   GFRNONAA 7 (L) 11/11/2023 0706   Lipase     Component Value Date/Time   LIPASE 49 09/06/2023 1808       Studies/Results: ECHOCARDIOGRAM COMPLETE Result Date: 11/10/2023    ECHOCARDIOGRAM REPORT   Patient Name:   Joshua Riley Date of Exam: 11/10/2023 Medical Rec #:   [id number]       Height:       68.0 in Accession #:    [id number]      Weight:       151.7 lb Date of Birth:  1936/06/22       BSA:          1.817 m Patient Age:    87 years        BP:           89/59 mmHg Patient Gender: M               HR:           58 bpm. Exam Location:  Inpatient Procedure: 2D Echo, Color Doppler and Cardiac Doppler (Both Spectral and Color            Flow Doppler were utilized during procedure). Indications:    R07.9* Chest pain, unspecified  History:        Patient has prior history of Echocardiogram examinations, most                 recent 05/27/2022.  Sonographer:    Andrena Bang Referring Phys: CALLIE E GOODRICH IMPRESSIONS  1. Left ventricular ejection fraction, by estimation,  is 55 to 60%. The left ventricle has normal function. The left ventricle has no regional wall motion abnormalities. Left ventricular diastolic parameters are consistent with Grade I diastolic dysfunction (impaired relaxation).  2. Right ventricular systolic function is normal. The right ventricular size is moderately enlarged.  3. The mitral valve is normal in structure. No evidence of mitral valve regurgitation. No evidence of mitral stenosis.  4. Tricuspid valve regurgitation is moderate.  5. The aortic valve is calcified. There is mild calcification of the aortic valve. There is mild thickening of the aortic valve. Aortic valve regurgitation is mild. Aortic valve sclerosis is present, with no evidence of aortic valve stenosis. Aortic valve mean gradient measures 4.0 mmHg. Aortic valve Vmax measures 1.46 m/s.  6. Aorta measured 40 mm on CT chest 11/09/2023. Aortic dilatation noted. There is mild dilatation of the ascending aorta, measuring 40 mm.  7. The inferior vena cava is normal in size with greater than 50% respiratory variability, suggesting right atrial pressure of 3 mmHg. FINDINGS  Left Ventricle: Left ventricular ejection fraction, by estimation, is 55 to 60%. The left ventricle has normal function. The left  ventricle has no regional wall motion abnormalities. Definity contrast agent was given IV to delineate the left ventricular  endocardial borders. The left ventricular internal cavity size was normal in size. There is no left ventricular hypertrophy. Left ventricular diastolic parameters are consistent with Grade I diastolic dysfunction (impaired relaxation). Right Ventricle: The right ventricular size is moderately enlarged. No increase in right ventricular wall thickness. Right ventricular systolic function is normal. Left Atrium: Left atrial size was normal in size. Right Atrium: Right atrial size was normal in size. Pericardium: There is no evidence of pericardial effusion. Mitral Valve: The mitral valve is normal in structure. No evidence of mitral valve regurgitation. No evidence of mitral valve stenosis. MV peak gradient, 5.7 mmHg. The mean mitral valve gradient is 3.0 mmHg. Tricuspid Valve: The tricuspid valve is normal in structure. Tricuspid valve regurgitation is moderate . No evidence of tricuspid stenosis. Aortic Valve: The aortic valve is calcified. There is mild calcification of the aortic valve. There is mild thickening of the aortic valve. Aortic valve regurgitation is mild. Aortic valve sclerosis is present, with no evidence of aortic valve stenosis. Aortic valve mean gradient measures 4.0 mmHg. Aortic valve peak gradient measures 8.5 mmHg. Aortic valve area, by VTI measures 2.72 cm. Pulmonic Valve: The pulmonic valve was normal in structure. Pulmonic valve regurgitation is trivial. No evidence of pulmonic stenosis. Aorta: Aorta measured 40 mm on CT chest 11/09/2023. Aortic dilatation noted. There is mild dilatation of the ascending aorta, measuring 40 mm. Venous: The inferior vena cava is normal in size with greater than 50% respiratory variability, suggesting right atrial pressure of 3 mmHg. IAS/Shunts: No atrial level shunt detected by color flow Doppler.  LEFT VENTRICLE PLAX 2D LVIDd:          4.60 cm      Diastology LVIDs:         3.00 cm      LV e' medial:    7.29 cm/s LV PW:         1.40 cm      LV E/e' medial:  16.7 LV IVS:        0.70 cm      LV e' lateral:   9.03 cm/s LVOT diam:     2.20 cm      LV E/e' lateral: 13.5 LV SV:  89 LV SV Index:   49 LVOT Area:     3.80 cm  LV Volumes (MOD) LV vol d, MOD A2C: 119.0 ml LV vol d, MOD A4C: 151.0 ml LV vol s, MOD A2C: 38.3 ml LV vol s, MOD A4C: 61.0 ml LV SV MOD A2C:     80.7 ml LV SV MOD A4C:     151.0 ml LV SV MOD BP:      85.0 ml RIGHT VENTRICLE RV S prime:     10.90 cm/s TAPSE (M-mode): 1.9 cm LEFT ATRIUM           Index LA Vol (A2C): 91.9 ml 50.58 ml/m LA Vol (A4C): 85.2 ml 46.89 ml/m  AORTIC VALVE AV Area (Vmax):    2.92 cm AV Area (Vmean):   2.81 cm AV Area (VTI):     2.72 cm AV Vmax:           146.00 cm/s AV Vmean:          91.700 cm/s AV VTI:            0.328 m AV Peak Grad:      8.5 mmHg AV Mean Grad:      4.0 mmHg LVOT Vmax:         112.00 cm/s LVOT Vmean:        67.700 cm/s LVOT VTI:          0.235 m LVOT/AV VTI ratio: 0.72  AORTA Ao Asc diam: 4.60 cm MITRAL VALVE MV Area (PHT): 2.84 cm     SHUNTS MV Area VTI:   1.95 cm     Systemic VTI:  0.24 m MV Peak grad:  5.7 mmHg     Systemic Diam: 2.20 cm MV Mean grad:  3.0 mmHg MV Vmax:       1.19 m/s MV Vmean:      75.2 cm/s MV Decel Time: 267 msec MV E velocity: 122.00 cm/s MV A velocity: 116.00 cm/s MV E/A ratio:  1.05 Dorothye Gathers MD Electronically signed by Dorothye Gathers MD Signature Date/Time: 11/10/2023/1:22:42 PM    Final    US  Abdomen Limited RUQ (LIVER/GB) Result Date: 11/09/2023 CLINICAL DATA:  [id number] Abdominal pain [id number] Cholelithiases 92642 EXAM: ULTRASOUND ABDOMEN LIMITED RIGHT UPPER QUADRANT COMPARISON:  None Available. FINDINGS: Gallbladder: Gallbladder wall thickened. Shadowing stone at the fundus. No pericholecystic fluid. Common bile duct: Diameter: 0.7 cm. Liver: Liver is hyperechoic consistent with fatty infiltration. No focal hepatic lesions identified. No  intrahepatic ductal dilatation. Hepatopetal portal vein flow. IMPRESSION: 1. Cholelithiasis. Gallbladder wall thickening. 2. Fatty infiltration of the liver. Electronically Signed   By: Sydell Eva M.D.   On: 11/09/2023 22:15    Anti-infectives: Anti-infectives (From admission, onward)    Start     Dose/Rate Route Frequency Ordered Stop   11/11/23 1200  vancomycin  (VANCOREADY) IVPB 750 mg/150 mL        750 mg 150 mL/hr over 60 Minutes Intravenous Every M-W-F (Hemodialysis) 11/09/23 2017     11/10/23 2200  meropenem  (MERREM ) 1 g in sodium chloride  0.9 % 100 mL IVPB        1 g 200 mL/hr over 30 Minutes Intravenous Every 24 hours 11/09/23 0753     11/10/23 1830  vancomycin  (VANCOREADY) IVPB 750 mg/150 mL        750 mg 150 mL/hr over 60 Minutes Intravenous STAT 11/10/23 1816 11/10/23 2109   11/09/23 0600  meropenem  (MERREM ) 1 g in sodium chloride  0.9 % 100 mL IVPB  Status:  Discontinued        1 g 200 mL/hr over 30 Minutes Intravenous Every 8 hours 11/09/23 0146 11/09/23 0753   11/09/23 0330  vancomycin  (VANCOCIN ) 500 mg in sodium chloride  0.9 % 100 mL IVPB       Placed in "Followed by" Linked Group   500 mg 100 mL/hr over 60 Minutes Intravenous  Once 11/09/23 0220 11/09/23 0525   11/09/23 0230  vancomycin  (VANCOCIN ) IVPB 1000 mg/200 mL premix       Placed in "Followed by" Linked Group   1,000 mg 200 mL/hr over 60 Minutes Intravenous  Once 11/09/23 0220 11/09/23 0341   11/09/23 0200  vancomycin  (VANCOREADY) IVPB 1500 mg/300 mL  Status:  Discontinued        1,500 mg 150 mL/hr over 120 Minutes Intravenous  Once 11/09/23 0146 11/09/23 0220        Assessment/Plan H/O UC, s/p TAC with ileostomy, recurrent presacral fluid collection -IR drain scheduled for today -no role for surgical intervention -cultures to help determine treatment needed -he will ultimately benefit from further follow up with [facility]. Genita [facility] at [facility], but she has clarified that there is no role for surgery in his case    FEN - NPO for IR procedure VTE - heparin  gtt ID - Merrem /Vanc   Pericarditis ESRD A fib H/O UC Hypotension  I reviewed Consultant cardiology, IR notes, hospitalist notes, last 24 h vitals and pain scores, last 48 h intake and output, last 24 h labs and trends, and last 24 h imaging results.   LOS: 2 days    Joshua Riley , [facility] 11/11/2023, 9:17 AM Please see Amion for pager number during day hours 7:00am-4:30pm or 7:00am -11:30am on weekends

## 2023-11-11 NOTE — Progress Notes (Signed)
 Pt sitting in chair. 1 unsuccessful attempt made for PIV. Notified unit RN to reconsult when pt is back in bed and can be assessed for USPIV

## 2023-11-11 NOTE — Progress Notes (Addendum)
 Contacted by nephrologist regarding pt's need for iv ertapenem  with HD at d/c. Contacted [facility] charge RN to discuss pt's need. Charge RN to investigate if clinic can order this med and when it may arrive. Charge RN to return call to navigator later today. Update provided to nephrologist and attending. Will assist as needed.   Lauraine Polite Renal Navigator [phone]  Addendum at 3:30 pm: Clinic to attempt to get approval for iv ertapenem  (med is non-formulary). Med to be ordered if approval received. It is unknown how long it will take for med to be delivered to clinic. Will keep in contact with clinic staff regarding approval and delivery date of med. Update provided to nephrologist, attending, and ID.

## 2023-11-11 NOTE — Progress Notes (Signed)
 PROGRESS NOTE    Joshua Riley  ZOX:[id number] DOB: [birth year] DOA: 11/08/2023 PCP: Almira Jaeger, MD    Chief Complaint  Patient presents with   Chest Pain    Brief Narrative:  88 year old with past medical history significant for ESRD on hemodialysis MWF with, paroxysmal A-fib, depression, obstructive uropathy with chronic left-sided percutaneous nephrostomy, ulcerative colitis status post colectomy 2011 with ileostomy, ileal pouch, Annelle anastomosis surgery 2012, chronic hypotension, anemia, history of DVT, sepsis, groin abscess recently admitted on March for abdominal pain found to have a small bowel obstruction presented to [facility] the day prior to admission complaining of chest pain, heaviness started 1 hour prior to arrival.   During evaluation in the ED troponin were elevated 428, EKG showed A-fib right bundle branch block.  Cardiology was consulted and think patient has pericarditis.  Patient was started on ibuprofen.   Patient was also found to have presacral peripherally enhancing fluid collection slightly increased in size compared to 09/06/2023 now measuring 4.7 x 5.5 cm.  Fluid collection extend into the left left ischial and and off also.  The presacral fluid collection abuts the anterior cortex of the sacrum which demonstrated mild irregularity suspicious for osteomyelitis.  General surgery consulted recommended IR evaluation for drain placement.  ID consulted for antibiotic duration and recommendations.    Assessment & Plan:   Principal Problem:   Chest pain Active Problems:   Acute pericarditis   End-stage renal disease on hemodialysis (HCC)   Atrial fibrillation, persistent (HCC)   Ulcerative colitis (HCC)   Orthostatic hypotension   Nephrostomy present (HCC)   DOE (dyspnea on exertion)   History of anemia due to chronic kidney disease   Sacral osteomyelitis (HCC)  #1 chest pain, concern for acute pericarditis -Patient noted to have presented with chest  pain with elevated troponins. - 2D echo obtained with EF of 55 to 60%,NWMA, grade 1 diastolic dysfunction, aortic valve sclerosis with no evidence of aortic valve stenosis.  Aorta measured 40 mm. - Patient currently clinically improved denies any chest pain. -Patient seen in consultation by cardiology and placed on ibuprofen to treat pericarditis. - Continue PPI for GI prophylaxis while on NSAIDs. - Per cardiology.  2.  Presacral fluid collection, sacral osteomyelitis -Patient with prior history of presacral fluid collection noted to have had multiple drains placed in the past. - It is noted in the past that patient was not a candidate for surgery. - Patient noted to have completed 6 weeks of antibiotics in January 2025. - CT done with worsening fluid collection concern for osteomyelitis. - Blood cultures with no growth to date. - Patient seen in consultation by general surgery who spoke with Dr. Genita Keys, colorectal surgeon at [facility] who has been following the patient and it is noted that no surgical options for this problem at this time per general surgery. - General Surgery recommending drain placement by IR which was done earlier on this morning, 11/11/2023 with cultures pending. - Continue IV vancomycin , IV Merrem . - ID consulted for antibiotic duration and recommendations were recommending continuation of IV vancomycin  IV Merrem  while in-house and on discharge will likely need IV vancomycin  IV at dependent which could be given during hemodialysis. - Appreciate general surgery and ID input and recommendations.  3.  Chronic orthostatic hypotension -Status post IV albumin  during the hospitalization with systolic blood pressures in the 80s. -Cortisol level noted at 12.8. - Continue current antibiotic treatment for problem #2. - Continue current dose of midodrine  at 15 mg 3  times daily.  4.  Chronic dyspnea -Chronic problem ongoing off and on.  Some concern for deconditioning chronic  hypotension. -2D echo obtained with EF of 55 to 60%,NWMA, grade 1 diastolic dysfunction, aortic valve sclerosis with no evidence of aortic valve stenosis.  Aorta measured 40 mm.  5.  ESRD on HD Monday Wednesday Friday -Continue midodrine . - Nephrology following.  6.  Anemia of chronic kidney disease -Follow H&H. - Transfusion threshold hemoglobin < 7.  7.  History of A-fib -Currently rate controlled. - On IV heparin  due to recent drain placement, likely transition to Eliquis  in the next 24 hours if remains stable.  8.  History of ulcerative colitis/status post colectomy 2011 with ileostomy, ileal pouch anal anastomosis surgery 2012/history of SBO -Currently stable. - Patient with good output from ostomy bag. - C. difficile PCR negative. - Is on Imodium as needed for loose stools.  9.  History of obstructive uropathy with chronic left PCN in place -Outpatient follow-up with urology.  10.  Abdominal pain -Patient have some complaints of abdominal pain differential of cholelithiasis versus a vascular etiology. - Noted with severe narrowing of the celiac origin secondary to atherosclerotic calcified plaque along with a calcified plaque at the SMA without hemodynamically significant stenosis. - Patient with surgical grafts in the left common femoral artery and vein in the subcutaneous ventral left thigh. - Right upper quadrant ultrasound with cholelithiasis, gallbladder wall thickening, fatty infiltration of the liver. - C. difficile PCR negative. - Follow-up.  11.  Irregular asymmetric left bladder wall thickening similar to prior -Outpatient follow-up with urology.  DVT prophylaxis: Heparin  drip Code Status: Full Family Communication: Updated patient.  No family at bedside. Disposition: TBD  Status is: Inpatient Remains inpatient appropriate because: Severity of illness   Consultants:  Interventional radiology: Dr. Julietta Ogren 11/10/2023 General Surgery: Dr. Aniceto Barley  11/10/2023 Cardiology: Dr. Lavonne Prairie 11/09/2023 Wound care RN ID: Dr. Zelda Hickman 11/11/2023  Procedures:  CT angiogram chest abdomen and pelvis 11/09/2023 Chest x-ray 11/08/2023 Right upper quadrant ultrasound 11/09/2023 2D echo 11/10/2023 Image guided left transgluteal drain placement, presacral 10 French pigtail drain per IR: Dr. Mabel Savage 11/11/2023  Antimicrobials:  Anti-infectives (From admission, onward)    Start     Dose/Rate Route Frequency Ordered Stop   11/11/23 1200  vancomycin  (VANCOREADY) IVPB 750 mg/150 mL  Status:  Discontinued        750 mg 150 mL/hr over 60 Minutes Intravenous Every M-W-F (Hemodialysis) 11/09/23 2017 11/11/23 1142   11/11/23 1144  vancomycin  variable dose per unstable renal function (pharmacist dosing)         Does not apply See admin instructions 11/11/23 1144     11/10/23 2200  meropenem  (MERREM ) 1 g in sodium chloride  0.9 % 100 mL IVPB        1 g 200 mL/hr over 30 Minutes Intravenous Every 24 hours 11/09/23 0753     11/10/23 1830  vancomycin  (VANCOREADY) IVPB 750 mg/150 mL        750 mg 150 mL/hr over 60 Minutes Intravenous STAT 11/10/23 1816 11/10/23 2109   11/09/23 0600  meropenem  (MERREM ) 1 g in sodium chloride  0.9 % 100 mL IVPB  Status:  Discontinued        1 g 200 mL/hr over 30 Minutes Intravenous Every 8 hours 11/09/23 0146 11/09/23 0753   11/09/23 0330  vancomycin  (VANCOCIN ) 500 mg in sodium chloride  0.9 % 100 mL IVPB       Placed in "Followed by" Linked Group   500 mg  100 mL/hr over 60 Minutes Intravenous  Once 11/09/23 0220 11/09/23 0525   11/09/23 0230  vancomycin  (VANCOCIN ) IVPB 1000 mg/200 mL premix       Placed in "Followed by" Linked Group   1,000 mg 200 mL/hr over 60 Minutes Intravenous  Once 11/09/23 0220 11/09/23 0341   11/09/23 0200  vancomycin  (VANCOREADY) IVPB 1500 mg/300 mL  Status:  Discontinued        1,500 mg 150 mL/hr over 120 Minutes Intravenous  Once 11/09/23 0146 11/09/23 0220         Subjective: Patient sitting up in bed getting  ready to eat his lunch.  Denies any further chest pain.  No shortness of breath.  No abdominal pain.  Objective: Vitals:   11/11/23 1105 11/11/23 1110 11/11/23 1152 11/11/23 1609  BP: (!) 92/50 (!) 99/50 (!) 89/53 (!) 81/52  Pulse: (!) 52 67 73 60  Resp: 14 12  16   Temp:    (!) 97.4 F (36.3 C)  TempSrc:    Oral  SpO2: 100% 100%  100%  Weight:      Height:        Intake/Output Summary (Last 24 hours) at 11/11/2023 1843 Last data filed at 11/11/2023 1800 Gross per 24 hour  Intake 710.07 ml  Output 350 ml  Net 360.07 ml   Filed Weights   11/10/23 1822 11/10/23 1830 11/11/23 0511  Weight: 70 kg 70 kg 68.6 kg    Examination:  General exam: Appears calm and comfortable  Respiratory system: Clear to auscultation anterior lung fields.  No wheezes, no crackles, no rhonchi.  Fair air movement.  Speaking in full sentences.Aaron Aas Respiratory effort normal. Cardiovascular system: Irregularly irregular. No JVD, murmurs, rubs, gallops or clicks. No pedal edema. Gastrointestinal system: Abdomen is nondistended, soft and nontender.  Ileostomy bag intact with stool noted.  No organomegaly or masses felt. Normal bowel sounds heard. -JP drain with serosanguineous drainage noted. Central nervous system: Alert and oriented. No focal neurological deficits. Extremities: Symmetric 5 x 5 power. Skin: No rashes, lesions or ulcers Psychiatry: Judgement and insight appear normal. Mood & affect appropriate.     Data Reviewed: I have personally reviewed following labs and imaging studies  CBC: Recent Labs  Lab 11/08/23 2240 11/10/23 0347 11/11/23 0005 11/11/23 0706  WBC 15.8* 13.2* 9.7 10.6*  HGB 9.8* 9.2* 8.4* 9.3*  HCT 29.0* 27.2* 24.9* 28.2*  MCV 116.5* 115.3* 115.3* 116.0*  PLT 197 226 204 204    Basic Metabolic Panel: Recent Labs  Lab 11/08/23 2240 11/09/23 1804 11/10/23 1148 11/11/23 0706  NA 136 132* 131* 133*  K 3.7 4.4 4.2 4.2  CL 92* 94* 93* 92*  CO2 25 20* 19* 23  GLUCOSE  167* 137* 102* 89  BUN 67* 78* 95* 49*  CREATININE 10.10* 11.24* 11.83* 7.34*  CALCIUM 10.2 8.9 9.1 9.1  MG  --  2.2  --   --   PHOS  --   --  7.1*  --     GFR: Estimated Creatinine Clearance: 7.1 mL/min (A) (by C-G formula based on SCr of 7.34 mg/dL (H)).  Liver Function Tests: Recent Labs  Lab 11/09/23 1804 11/10/23 1148  AST 16  --   ALT 32  --   ALKPHOS 77  --   BILITOT 1.1  --   PROT 6.5  --   ALBUMIN  2.6* 2.9*    CBG: No results for input(s): "GLUCAP" in the last 168 hours.   Recent Results (from the past  240 hours)  Culture, blood (Routine X 2) w Reflex to ID Panel     Status: None (Preliminary result)   Collection Time: 11/09/23  9:52 PM   Specimen: BLOOD  Result Value Ref Range Status   Specimen Description BLOOD BLOOD RIGHT ARM  Final   Special Requests   Final    BOTTLES DRAWN AEROBIC AND ANAEROBIC Blood Culture results may not be optimal due to an inadequate volume of blood received in culture bottles   Culture   Final    NO GROWTH 2 DAYS Performed at [facility], [address] [place], [place] [address]    Report Status PENDING  Incomplete  Culture, blood (Routine X 2) w Reflex to ID Panel     Status: None (Preliminary result)   Collection Time: 11/09/23  9:52 PM   Specimen: BLOOD  Result Value Ref Range Status   Specimen Description BLOOD BLOOD RIGHT HAND  Final   Special Requests   Final    BOTTLES DRAWN AEROBIC AND ANAEROBIC Blood Culture results may not be optimal due to an inadequate volume of blood received in culture bottles   Culture   Final    NO GROWTH 2 DAYS Performed at [facility], [address], [place], [place] [address]    Report Status PENDING  Incomplete  C Difficile Quick Screen w PCR reflex     Status: None   Collection Time: 11/10/23 10:53 AM   Specimen: STOOL  Result Value Ref Range Status   C Diff antigen NEGATIVE NEGATIVE Final   C Diff toxin NEGATIVE NEGATIVE Final   C Diff interpretation No C. difficile  detected.  Final    Comment: Performed at [facility], [address] [place], [place] [address]  Aerobic/Anaerobic Culture w Gram Stain (surgical/deep wound)     Status: None (Preliminary result)   Collection Time: 11/11/23 11:10 AM   Specimen: Abdomen; Abscess  Result Value Ref Range Status   Specimen Description ABDOMEN  Final   Special Requests NONE  Final   Gram Stain   Final    ABUNDANT WBC PRESENT, PREDOMINANTLY PMN FEW GRAM NEGATIVE RODS RARE GRAM POSITIVE COCCI Performed at [facility], [address], [place], [place] [address]    Culture PENDING  Incomplete   Report Status PENDING  Incomplete         Radiology Studies: CT GUIDED PERITONEAL/RETROPERITONEAL FLUID DRAIN BY PERC CATH Result Date: 11/11/2023 INDICATION: 88 year old male with a history of presacral fluid collection referred for drainage EXAM: CT-GUIDED ABSCESS DRAIN TECHNIQUE: Multidetector CT imaging of the pelvis was performed following the standard protocol without IV contrast. RADIATION DOSE REDUCTION: This exam was performed according to the departmental dose-optimization program which includes automated exposure control, adjustment of the mA and/or kV according to patient size and/or use of iterative reconstruction technique. MEDICATIONS: The patient is currently admitted to the hospital and receiving intravenous antibiotics. The antibiotics were administered within an appropriate time frame prior to the initiation of the procedure. ANESTHESIA/SEDATION: Moderate (conscious) sedation was employed during this procedure. A total of Versed  1.0 mg and Fentanyl  50 mcg was administered intravenously by the radiology nurse. Total intra-service moderate Sedation Time: 14 minutes. The patient's level of consciousness and vital signs were monitored continuously by radiology nursing throughout the procedure under my direct supervision. COMPLICATIONS: None PROCEDURE: Informed written consent was obtained from the  patient after a thorough discussion of the procedural risks, benefits and alternatives. All questions  were addressed. Maximal Sterile Barrier Technique was utilized including caps, mask, sterile gowns, sterile gloves, sterile drape, hand hygiene and skin antiseptic. A timeout was performed prior to the initiation of the procedure. Patient was positioned right decubitus on the CT gantry table. Scout CT acquired for planning purposes. The patient is prepped and draped in the usual sterile fashion. 1% lidocaine  was used for local anesthesia. Using CT guidance, a Yueh needle was advanced into the presacral location from a left transgluteal approach. Once we confirmed the catheter position modified Seldinger technique was used to place a 10 [place] drain. Approximately 20 cc of frankly purulent material aspirated for culture. Drain was attached to bulb suction. Final image was stored. Patient tolerated the procedure well and remained hemodynamically stable throughout. No complications were encountered and no significant blood loss. IMPRESSION: Status post CT-guided left transgluteal drain for presacral abscess. Signed, Marciano Settles. Rexine Cater, RPVI [facility] Electronically Signed   By: Myrlene Asper D.O.   On: 11/11/2023 17:10   ECHOCARDIOGRAM COMPLETE Result Date: 11/10/2023    ECHOCARDIOGRAM REPORT   Patient Name:   Joshua Riley Date of Exam: 11/10/2023 Medical Rec #:  [id number]       Height:       68.0 in Accession #:    [id number]      Weight:       151.7 lb Date of Birth:  1936/06/14       BSA:          1.817 m Patient Age:    87 years        BP:           89/59 mmHg Patient Gender: M               HR:           58 bpm. Exam Location:  Inpatient Procedure: 2D Echo, Color Doppler and Cardiac Doppler (Both Spectral and Color            Flow Doppler were utilized during procedure). Indications:    R07.9* Chest pain, unspecified  History:        Patient  has prior history of Echocardiogram examinations, most                 recent 05/27/2022.  Sonographer:    Andrena Bang Referring Phys: CALLIE E GOODRICH IMPRESSIONS  1. Left ventricular ejection fraction, by estimation, is 55 to 60%. The left ventricle has normal function. The left ventricle has no regional wall motion abnormalities. Left ventricular diastolic parameters are consistent with Grade I diastolic dysfunction (impaired relaxation).  2. Right ventricular systolic function is normal. The right ventricular size is moderately enlarged.  3. The mitral valve is normal in structure. No evidence of mitral valve regurgitation. No evidence of mitral stenosis.  4. Tricuspid valve regurgitation is moderate.  5. The aortic valve is calcified. There is mild calcification of the aortic valve. There is mild thickening of the aortic valve. Aortic valve regurgitation is mild. Aortic valve sclerosis is present, with no evidence of aortic valve stenosis. Aortic valve mean gradient measures 4.0 mmHg. Aortic valve Vmax measures 1.46 m/s.  6. Aorta measured 40 mm on CT chest 11/09/2023. Aortic dilatation noted. There is mild dilatation of the ascending aorta, measuring 40 mm.  7. The inferior vena cava is normal in size with greater than 50% respiratory variability, suggesting right atrial pressure of 3 mmHg. FINDINGS  Left Ventricle: Left ventricular  ejection fraction, by estimation, is 55 to 60%. The left ventricle has normal function. The left ventricle has no regional wall motion abnormalities. Definity contrast agent was given IV to delineate the left ventricular  endocardial borders. The left ventricular internal cavity size was normal in size. There is no left ventricular hypertrophy. Left ventricular diastolic parameters are consistent with Grade I diastolic dysfunction (impaired relaxation). Right Ventricle: The right ventricular size is moderately enlarged. No increase in right ventricular wall thickness. Right  ventricular systolic function is normal. Left Atrium: Left atrial size was normal in size. Right Atrium: Right atrial size was normal in size. Pericardium: There is no evidence of pericardial effusion. Mitral Valve: The mitral valve is normal in structure. No evidence of mitral valve regurgitation. No evidence of mitral valve stenosis. MV peak gradient, 5.7 mmHg. The mean mitral valve gradient is 3.0 mmHg. Tricuspid Valve: The tricuspid valve is normal in structure. Tricuspid valve regurgitation is moderate . No evidence of tricuspid stenosis. Aortic Valve: The aortic valve is calcified. There is mild calcification of the aortic valve. There is mild thickening of the aortic valve. Aortic valve regurgitation is mild. Aortic valve sclerosis is present, with no evidence of aortic valve stenosis. Aortic valve mean gradient measures 4.0 mmHg. Aortic valve peak gradient measures 8.5 mmHg. Aortic valve area, by VTI measures 2.72 cm. Pulmonic Valve: The pulmonic valve was normal in structure. Pulmonic valve regurgitation is trivial. No evidence of pulmonic stenosis. Aorta: Aorta measured 40 mm on CT chest 11/09/2023. Aortic dilatation noted. There is mild dilatation of the ascending aorta, measuring 40 mm. Venous: The inferior vena cava is normal in size with greater than 50% respiratory variability, suggesting right atrial pressure of 3 mmHg. IAS/Shunts: No atrial level shunt detected by color flow Doppler.  LEFT VENTRICLE PLAX 2D LVIDd:         4.60 cm      Diastology LVIDs:         3.00 cm      LV e' medial:    7.29 cm/s LV PW:         1.40 cm      LV E/e' medial:  16.7 LV IVS:        0.70 cm      LV e' lateral:   9.03 cm/s LVOT diam:     2.20 cm      LV E/e' lateral: 13.5 LV SV:         89 LV SV Index:   49 LVOT Area:     3.80 cm  LV Volumes (MOD) LV vol d, MOD A2C: 119.0 ml LV vol d, MOD A4C: 151.0 ml LV vol s, MOD A2C: 38.3 ml LV vol s, MOD A4C: 61.0 ml LV SV MOD A2C:     80.7 ml LV SV MOD A4C:     151.0 ml LV SV MOD  BP:      85.0 ml RIGHT VENTRICLE RV S prime:     10.90 cm/s TAPSE (M-mode): 1.9 cm LEFT ATRIUM           Index LA Vol (A2C): 91.9 ml 50.58 ml/m LA Vol (A4C): 85.2 ml 46.89 ml/m  AORTIC VALVE AV Area (Vmax):    2.92 cm AV Area (Vmean):   2.81 cm AV Area (VTI):     2.72 cm AV Vmax:           146.00 cm/s AV Vmean:          91.700 cm/s AV VTI:  0.328 m AV Peak Grad:      8.5 mmHg AV Mean Grad:      4.0 mmHg LVOT Vmax:         112.00 cm/s LVOT Vmean:        67.700 cm/s LVOT VTI:          0.235 m LVOT/AV VTI ratio: 0.72  AORTA Ao Asc diam: 4.60 cm MITRAL VALVE MV Area (PHT): 2.84 cm     SHUNTS MV Area VTI:   1.95 cm     Systemic VTI:  0.24 m MV Peak grad:  5.7 mmHg     Systemic Diam: 2.20 cm MV Mean grad:  3.0 mmHg MV Vmax:       1.19 m/s MV Vmean:      75.2 cm/s MV Decel Time: 267 msec MV E velocity: 122.00 cm/s MV A velocity: 116.00 cm/s MV E/A ratio:  1.05 Dorothye Gathers MD Electronically signed by Dorothye Gathers MD Signature Date/Time: 11/10/2023/1:22:42 PM    Final    US  Abdomen Limited RUQ (LIVER/GB) Result Date: 11/09/2023 CLINICAL DATA:  [id number] Abdominal pain [id number] Cholelithiases [id number] EXAM: ULTRASOUND ABDOMEN LIMITED RIGHT UPPER QUADRANT COMPARISON:  None Available. FINDINGS: Gallbladder: Gallbladder wall thickened. Shadowing stone at the fundus. No pericholecystic fluid. Common bile duct: Diameter: 0.7 cm. Liver: Liver is hyperechoic consistent with fatty infiltration. No focal hepatic lesions identified. No intrahepatic ductal dilatation. Hepatopetal portal vein flow. IMPRESSION: 1. Cholelithiasis. Gallbladder wall thickening. 2. Fatty infiltration of the liver. Electronically Signed   By: Sydell Eva M.D.   On: 11/09/2023 22:15        Scheduled Meds:  (feeding supplement) PROSource Plus  30 mL Oral TID BM   atorvastatin  20 mg Oral Daily   [START ON 11/12/2023] calcitRIOL   0.5 mcg Oral Q T,Th,Sa-HD   Chlorhexidine  Gluconate Cloth  6 each Topical Q0600   darbepoetin (ARANESP )  injection - DIALYSIS  60 mcg Subcutaneous Q Tue-1800   dorzolamide   1 drop Right Eye QHS   ibuprofen  800 mg Oral TID   midodrine   15 mg Oral TID   pantoprazole   40 mg Oral BID   sevelamer  carbonate  1.6 g Oral TID WC   sodium chloride  flush  5 mL Intracatheter Q8H   thiamine (VITAMIN B1) injection  100 mg Intravenous Daily   timolol   1 drop Both Eyes BID   vancomycin  variable dose per unstable renal function (pharmacist dosing)   Does not apply See admin instructions   Continuous Infusions:  heparin  1,050 Units/hr (11/11/23 1800)   meropenem  (MERREM ) IV 1 g (11/10/23 2155)     LOS: 2 days    Time spent: 40 minutes    Hilda Lovings, MD [facility]   To contact the attending provider between 7A-7P or the covering provider during after hours 7P-7A, please log into the web site [email or web address] and access using universal [place] password for that web site. If you do not have the password, please call the hospital operator.  11/11/2023, 6:43 PM

## 2023-11-11 NOTE — Progress Notes (Signed)
 Patient and patient's daughter requesting referral to local surgeon for drain care and abscess care, states patient will not be going back to [facility] facility. Also requesting to speak with case management about home health services as patient is alone majority of day.

## 2023-11-11 NOTE — Plan of Care (Signed)

## 2023-11-11 NOTE — Consult Note (Addendum)
 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 coli back in July 2025 admitted for NSTEMI found to have #Ischial/anal fossa abscess, sacral osteomyelitis - During January for presacral abscess appears he may have been treated with ertapenem  x 4 weeks as recommended.  Now he has a new fluid collection with possible osteomyelitis.  Per last ID note patient was not a surgical candidate because [facility] surgery.  As such we will treat for 2 weeks antibiotics following drain placement today.  He is currently on vancomycin  and meropenem . -Last seen by infectious disease on 07/17/2023 during hospitalization.  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 coli, ertapenem  with HD? Recommendations:  -Follow aspirate cultures - Anticipated 2 weeks of antibiotics from 5/5.  Relayed plan to nephrology.  HE can get vancomycin  and ertapenem  with IHD.  Continue daily antibiotics while inpatient.  Evaluation of this patient requires complex antimicrobial therapy evaluation and counseling + isolation needs for disease transmission risk assessment and mitigation   Microbiology:   Antibiotics:  Vancomycin  meropenem  5/5-present    HPI: Joshua Riley is a 88 y.o. 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  Admitted for NSTEMI. CT angio chest showed presacral fluid collection slightly increased in size since 09/06/2023.  Extension fluid collection into the left ischial anal fossa.  Suspicion for osteomyelitis.  ID engaged for antibiotic recommendations.  Review of Systems: Review of Systems  All other systems reviewed and are negative.   Past Medical History:  Diagnosis Date   A-fib (HCC)    Anemia    Arthritis    Cancer (HCC)    Basal cell   COVID-19    2021   Dysrhythmia    Afib-controlled on eliquis    ESRD (end stage renal disease) (HCC) 10/22/2021   Glaucoma 11/18/2021   History of DVT (deep vein thrombosis)    Hydronephrosis    managed wtih a PCN   Idiopathic neuropathy 10/22/2021   lyrica     Ileostomy in place [facility])    Obstructive uropathy    With chronic left nephrostomy   Old retinal detachment, total or subtotal    Orthostatic hypotension 10/22/2021   Sleep apnea    does not need a machine   Stroke (HCC)    Ulcerative colitis (HCC)    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    Social History   Tobacco Use   Smoking status: Former    Current packs/day: 0.00    Average packs/day: 2.0 packs/day for 6.0 years (12.0 ttl pk-yrs)    Types: Cigarettes    Start date: 78  Quit date: 73    Years since quitting: 40.3    Passive exposure: Never   Smokeless tobacco: Never  Vaping Use   Vaping status: Never Used  Substance Use Topics   Alcohol use: Yes    Alcohol/week: 5.0 standard drinks of alcohol    Types: 5 Shots of liquor per week    Comment: socially   Drug use: Never    Family History  Problem Relation Age of Onset   Stroke Mother    Cancer Father    Esophageal  cancer Brother    Scheduled Meds:  (feeding supplement) PROSource Plus  30 mL Oral TID BM   atorvastatin   20 mg Oral Daily   [START ON 11/12/2023] calcitRIOL   0.5 mcg Oral Q T,Th,Sa-HD   Chlorhexidine  Gluconate Cloth  6 each Topical Q0600   darbepoetin (ARANESP ) injection - DIALYSIS  60 mcg Subcutaneous Q Tue-1800   dorzolamide   1 drop Right Eye QHS   ibuprofen   800 mg Oral TID   midodrine   15 mg Oral TID   pantoprazole   40 mg Oral BID   sevelamer  carbonate  1.6 g Oral TID WC   thiamine  (VITAMIN B1) injection  100 mg Intravenous Daily   timolol   1 drop Both Eyes BID   vancomycin  variable dose per unstable renal function (pharmacist dosing)   Does not apply See admin instructions   Continuous Infusions:  heparin  1,050 Units/hr (11/11/23 1200)   meropenem  (MERREM ) IV 1 g (11/10/23 2155)   PRN Meds:.acetaminophen , albuterol , calcium  carbonate (dosed in mg elemental calcium ), camphor-menthol , docusate sodium , feeding supplement (NEPRO CARB STEADY), hydrOXYzine , loperamide , nitroGLYCERIN , ondansetron  (ZOFRAN ) IV, mouth rinse, polyethylene glycol, sorbitol  Allergies  Allergen Reactions   Cephalosporins Rash   Ciprofloxacin Itching and Rash   Baclofen Other (See Comments)    Altered mental status, after accidental overdose      OBJECTIVE: Blood pressure (!) 89/53, pulse 73, temperature (!) 97.5 F (36.4 C), temperature source Oral, resp. rate 12, height 5\' 8"  (1.727 m), weight 68.6 kg, SpO2 100%.  Physical Exam Constitutional:      General: He is not in acute distress.    Appearance: He is normal weight. He is not toxic-appearing.  HENT:     Head: Normocephalic and atraumatic.     Right Ear: External ear normal.     Left Ear: External ear normal.     Nose: No congestion or rhinorrhea.     Mouth/Throat:     Mouth: Mucous membranes are moist.     Pharynx: Oropharynx is clear.  Eyes:     Extraocular Movements: Extraocular movements intact.     Conjunctiva/sclera: Conjunctivae  normal.     Pupils: Pupils are equal, round, and reactive to light.  Cardiovascular:     Rate and Rhythm: Normal rate and regular rhythm.     Heart sounds: No murmur heard.    No friction rub. No gallop.  Pulmonary:     Effort: Pulmonary effort is normal.     Breath sounds: Normal breath sounds.  Abdominal:     Comments: Iliostomy L nephrotomy  Musculoskeletal:        General: No swelling.     Cervical back: Normal range of motion and neck supple.  Skin:    General: Skin is warm and dry.  Neurological:     General: No focal deficit present.     Mental Status: He is oriented to person, place, and time.  Psychiatric:        Mood  and Affect: Mood normal.     Lab Results Lab Results  Component Value Date   WBC 10.6 (H) 11/11/2023   HGB 9.3 (L) 11/11/2023   HCT 28.2 (L) 11/11/2023   MCV 116.0 (H) 11/11/2023   PLT 204 11/11/2023    Lab Results  Component Value Date   CREATININE 7.34 (H) 11/11/2023   BUN 49 (H) 11/11/2023   NA 133 (L) 11/11/2023   K 4.2 11/11/2023   CL 92 (L) 11/11/2023   CO2 23 11/11/2023    Lab Results  Component Value Date   ALT 32 11/09/2023   AST 16 11/09/2023   ALKPHOS 77 11/09/2023   BILITOT 1.1 11/09/2023       Orlie Bjornstad, MD [facility] 11/11/2023, 1:05 PM

## 2023-11-12 DIAGNOSIS — R079 Chest pain, unspecified: Secondary | ICD-10-CM | POA: Diagnosis not present

## 2023-11-12 DIAGNOSIS — I309 Acute pericarditis, unspecified: Secondary | ICD-10-CM | POA: Diagnosis not present

## 2023-11-12 DIAGNOSIS — I4819 Other persistent atrial fibrillation: Secondary | ICD-10-CM | POA: Diagnosis not present

## 2023-11-12 DIAGNOSIS — N186 End stage renal disease: Secondary | ICD-10-CM | POA: Diagnosis not present

## 2023-11-12 LAB — RENAL FUNCTION PANEL
Albumin: 2.8 g/dL — ABNORMAL LOW (ref 3.5–5.0)
Anion gap: 16 — ABNORMAL HIGH (ref 5–15)
BUN: 72 mg/dL — ABNORMAL HIGH (ref 8–23)
CO2: 19 mmol/L — ABNORMAL LOW (ref 22–32)
Calcium: 8.7 mg/dL — ABNORMAL LOW (ref 8.9–10.3)
Chloride: 93 mmol/L — ABNORMAL LOW (ref 98–111)
Creatinine, Ser: 8.98 mg/dL — ABNORMAL HIGH (ref 0.61–1.24)
GFR, Estimated: 5 mL/min — ABNORMAL LOW (ref >60)
Glucose, Bld: 153 mg/dL — ABNORMAL HIGH (ref 70–99)
Phosphorus: 5.3 mg/dL — ABNORMAL HIGH (ref 2.5–4.6)
Potassium: 4.7 mmol/L (ref 3.5–5.1)
Sodium: 128 mmol/L — ABNORMAL LOW (ref 135–145)

## 2023-11-12 LAB — CBC
HCT: 26.3 % — ABNORMAL LOW (ref 39.0–52.0)
Hemoglobin: 8.6 g/dL — ABNORMAL LOW (ref 13.0–17.0)
MCH: 38.2 pg — ABNORMAL HIGH (ref 26.0–34.0)
MCHC: 32.7 g/dL (ref 30.0–36.0)
MCV: 116.9 fL — ABNORMAL HIGH (ref 80.0–100.0)
Platelets: 206 10*3/uL (ref 150–400)
RBC: 2.25 MIL/uL — ABNORMAL LOW (ref 4.22–5.81)
RDW: 15.2 % (ref 11.5–15.5)
WBC: 9.5 10*3/uL (ref 4.0–10.5)
nRBC: 0 % (ref 0.0–0.2)

## 2023-11-12 LAB — APTT: aPTT: 64 s — ABNORMAL HIGH (ref 24–36)

## 2023-11-12 MED ORDER — FOLIC ACID 1 MG PO TABS
1.0000 mg | ORAL_TABLET | Freq: Every day | ORAL | Status: DC
  Administered 2023-11-12 – 2023-11-13 (×2): 1 mg via ORAL
  Filled 2023-11-12 (×3): qty 1

## 2023-11-12 MED ORDER — ALBUMIN HUMAN 25 % IV SOLN
12.5000 g | Freq: Once | INTRAVENOUS | Status: AC
  Administered 2023-11-12: 12.5 g via INTRAVENOUS
  Filled 2023-11-12: qty 50

## 2023-11-12 MED ORDER — APIXABAN 2.5 MG PO TABS
2.5000 mg | ORAL_TABLET | Freq: Two times a day (BID) | ORAL | Status: DC
  Administered 2023-11-12 – 2023-11-13 (×3): 2.5 mg via ORAL
  Filled 2023-11-12 (×4): qty 1

## 2023-11-12 MED ORDER — VANCOMYCIN HCL 750 MG/150ML IV SOLN
750.0000 mg | Freq: Once | INTRAVENOUS | Status: AC
  Administered 2023-11-12: 750 mg via INTRAVENOUS

## 2023-11-12 MED ORDER — VANCOMYCIN HCL 750 MG/150ML IV SOLN
INTRAVENOUS | Status: AC
  Filled 2023-11-12: qty 150

## 2023-11-12 MED ORDER — APIXABAN 5 MG PO TABS
5.0000 mg | ORAL_TABLET | Freq: Two times a day (BID) | ORAL | Status: DC
  Filled 2023-11-12: qty 1

## 2023-11-12 MED ORDER — LOPERAMIDE HCL 2 MG PO CAPS
2.0000 mg | ORAL_CAPSULE | Freq: Once | ORAL | Status: AC
  Administered 2023-11-12: 2 mg via ORAL
  Filled 2023-11-12: qty 1

## 2023-11-12 MED ORDER — CALCITRIOL 0.5 MCG PO CAPS
ORAL_CAPSULE | ORAL | Status: AC
  Filled 2023-11-12: qty 1

## 2023-11-12 NOTE — Progress Notes (Signed)
 Mobility Specialist: Progress Note   11/12/23 1559  Mobility  Activity Ambulated with assistance in hallway  Level of Assistance Contact guard assist, steadying assist  Assistive Device Front wheel walker  Distance Ambulated (ft) 125 ft  Activity Response Tolerated well  Mobility Referral Yes  Mobility visit 1 Mobility  Mobility Specialist Start Time (ACUTE ONLY) 1540  Mobility Specialist Stop Time (ACUTE ONLY) 1550  Mobility Specialist Time Calculation (min) (ACUTE ONLY) 10 min    Pt eager for mobility, received in chair. SV for STS, CG for ambulation d/t mild unsteadiness. P/t c/o BLE weakness. Returned to room without fault. Left in chair with all needs met, call bell in reach.   Deloria Fetch Mobility Specialist Please contact via SecureChat or Rehab office at [phone]

## 2023-11-12 NOTE — Progress Notes (Signed)
 [facility] Progress Note   Subjective:   feels decent.  Had drain placed by IR for presacral fluid collection.  For HD today.  Objective Vitals:   11/11/23 1955 11/11/23 2310 11/11/23 2328 11/12/23 0501  BP: (!) 76/50 (!) 91/49 (!) 87/54 (!) 87/50  Pulse: 61 61 (!) 58 64  Resp:  16    Temp:  97.7 F (36.5 C)  (!) 97.5 F (36.4 C)  TempSrc:  Oral  Oral  SpO2: 100% 100% 100% 100%  Weight:      Height:       General: Elderly male; frail; NAD; on RA Head: Sclera not icteric  Lungs: clear, No wheeze or rhonchi. Breathing is unlabored. Heart: RRR. No murmur, rubs or gallops.  Abdomen: soft and non-tender Lower extremities: No LE edema Neuro: AAOx3. Moves all extremities spontaneously. Dialysis Access: L thigh AVG (+) B/T  Additional Objective Labs: Basic Metabolic Panel: Recent Labs  Lab 11/09/23 1804 11/10/23 1148 11/11/23 0706  NA 132* 131* 133*  K 4.4 4.2 4.2  CL 94* 93* 92*  CO2 20* 19* 23  GLUCOSE 137* 102* 89  BUN 78* 95* 49*  CREATININE 11.24* 11.83* 7.34*  CALCIUM 8.9 9.1 9.1  PHOS  --  7.1*  --    Liver Function Tests: Recent Labs  Lab 11/09/23 1804 11/10/23 1148  AST 16  --   ALT 32  --   ALKPHOS 77  --   BILITOT 1.1  --   PROT 6.5  --   ALBUMIN  2.6* 2.9*   No results for input(s): "LIPASE", "AMYLASE" in the last 168 hours. CBC: Recent Labs  Lab 11/08/23 2240 11/10/23 0347 11/11/23 0005 11/11/23 0706  WBC 15.8* 13.2* 9.7 10.6*  HGB 9.8* 9.2* 8.4* 9.3*  HCT 29.0* 27.2* 24.9* 28.2*  MCV 116.5* 115.3* 115.3* 116.0*  PLT 197 226 204 204   Blood Culture    Component Value Date/Time   SDES ABDOMEN 11/11/2023 1110   SPECREQUEST NONE 11/11/2023 1110   CULT PENDING 11/11/2023 1110   REPTSTATUS PENDING 11/11/2023 1110    Cardiac Enzymes: No results for input(s): "CKTOTAL", "CKMB", "CKMBINDEX", "TROPONINI" in the last 168 hours. CBG: No results for input(s): "GLUCAP" in the last 168 hours. Iron Studies: No results for  input(s): "IRON", "TIBC", "TRANSFERRIN", "FERRITIN" in the last 72 hours. @lablastinr3 @ Studies/Results: CT GUIDED PERITONEAL/RETROPERITONEAL FLUID DRAIN BY PERC CATH Result Date: 11/11/2023 INDICATION: 88 year old male with a history of presacral fluid collection referred for drainage EXAM: CT-GUIDED ABSCESS DRAIN TECHNIQUE: Multidetector CT imaging of the pelvis was performed following the standard protocol without IV contrast. RADIATION DOSE REDUCTION: This exam was performed according to the departmental dose-optimization program which includes automated exposure control, adjustment of the mA and/or kV according to patient size and/or use of iterative reconstruction technique. MEDICATIONS: The patient is currently admitted to the hospital and receiving intravenous antibiotics. The antibiotics were administered within an appropriate time frame prior to the initiation of the procedure. ANESTHESIA/SEDATION: Moderate (conscious) sedation was employed during this procedure. A total of Versed  1.0 mg and Fentanyl  50 mcg was administered intravenously by the radiology nurse. Total intra-service moderate Sedation Time: 14 minutes. The patient's level of consciousness and vital signs were monitored continuously by radiology nursing throughout the procedure under my direct supervision. COMPLICATIONS: None PROCEDURE: Informed written consent was obtained from the patient after a thorough discussion of the procedural risks, benefits and alternatives. All questions were addressed. Maximal Sterile Barrier Technique was utilized including caps, mask, sterile gowns,  sterile gloves, sterile drape, hand hygiene and skin antiseptic. A timeout was performed prior to the initiation of the procedure. Patient was positioned right decubitus on the CT gantry table. Scout CT acquired for planning purposes. The patient is prepped and draped in the usual sterile fashion. 1% lidocaine  was used for local anesthesia. Using CT guidance, a  Yueh needle was advanced into the presacral location from a left transgluteal approach. Once we confirmed the catheter position modified Seldinger technique was used to place a 10 [place] drain. Approximately 20 cc of frankly purulent material aspirated for culture. Drain was attached to bulb suction. Final image was stored. Patient tolerated the procedure well and remained hemodynamically stable throughout. No complications were encountered and no significant blood loss. IMPRESSION: Status post CT-guided left transgluteal drain for presacral abscess. Signed, Joshua Riley. Joshua Riley, RPVI [facility] Radiology Electronically Signed   By: Joshua Riley D.O.   On: 11/11/2023 17:10   ECHOCARDIOGRAM COMPLETE Result Date: 11/10/2023    ECHOCARDIOGRAM REPORT   Patient Name:   Joshua Riley Date of Exam: 11/10/2023 Medical Rec #:  [id number]       Height:       68.0 in Accession #:    [id number]      Weight:       151.7 lb Date of Birth:  25-Jun-1936       BSA:          1.817 m Patient Age:    87 years        BP:           89/59 mmHg Patient Gender: M               HR:           58 bpm. Exam Location:  Inpatient Procedure: 2D Echo, Color Doppler and Cardiac Doppler (Both Spectral and Color            Flow Doppler were utilized during procedure). Indications:    R07.9* Chest pain, unspecified  History:        Patient has prior history of Echocardiogram examinations, most                 recent 05/27/2022.  Sonographer:    Joshua Riley Referring Phys: Joshua Riley IMPRESSIONS  1. Left ventricular ejection fraction, by estimation, is 55 to 60%. The left ventricle has normal function. The left ventricle has no regional wall motion abnormalities. Left ventricular diastolic parameters are consistent with Grade I diastolic dysfunction (impaired relaxation).  2. Right ventricular systolic function is normal. The right ventricular size is moderately enlarged.  3. The mitral valve  is normal in structure. No evidence of mitral valve regurgitation. No evidence of mitral stenosis.  4. Tricuspid valve regurgitation is moderate.  5. The aortic valve is calcified. There is mild calcification of the aortic valve. There is mild thickening of the aortic valve. Aortic valve regurgitation is mild. Aortic valve sclerosis is present, with no evidence of aortic valve stenosis. Aortic valve mean gradient measures 4.0 mmHg. Aortic valve Vmax measures 1.46 m/s.  6. Aorta measured 40 mm on CT chest 11/09/2023. Aortic dilatation noted. There is mild dilatation of the ascending aorta, measuring 40 mm.  7. The inferior vena cava is normal in size with greater than 50% respiratory variability, suggesting right atrial pressure of 3 mmHg. FINDINGS  Left Ventricle: Left ventricular ejection fraction, by estimation, is 55 to 60%. The left ventricle has  normal function. The left ventricle has no regional wall motion abnormalities. Definity contrast agent was given IV to delineate the left ventricular  endocardial borders. The left ventricular internal cavity size was normal in size. There is no left ventricular hypertrophy. Left ventricular diastolic parameters are consistent with Grade I diastolic dysfunction (impaired relaxation). Right Ventricle: The right ventricular size is moderately enlarged. No increase in right ventricular wall thickness. Right ventricular systolic function is normal. Left Atrium: Left atrial size was normal in size. Right Atrium: Right atrial size was normal in size. Pericardium: There is no evidence of pericardial effusion. Mitral Valve: The mitral valve is normal in structure. No evidence of mitral valve regurgitation. No evidence of mitral valve stenosis. MV peak gradient, 5.7 mmHg. The mean mitral valve gradient is 3.0 mmHg. Tricuspid Valve: The tricuspid valve is normal in structure. Tricuspid valve regurgitation is moderate . No evidence of tricuspid stenosis. Aortic Valve: The aortic  valve is calcified. There is mild calcification of the aortic valve. There is mild thickening of the aortic valve. Aortic valve regurgitation is mild. Aortic valve sclerosis is present, with no evidence of aortic valve stenosis. Aortic valve mean gradient measures 4.0 mmHg. Aortic valve peak gradient measures 8.5 mmHg. Aortic valve area, by VTI measures 2.72 cm. Pulmonic Valve: The pulmonic valve was normal in structure. Pulmonic valve regurgitation is trivial. No evidence of pulmonic stenosis. Aorta: Aorta measured 40 mm on CT chest 11/09/2023. Aortic dilatation noted. There is mild dilatation of the ascending aorta, measuring 40 mm. Venous: The inferior vena cava is normal in size with greater than 50% respiratory variability, suggesting right atrial pressure of 3 mmHg. IAS/Shunts: No atrial level shunt detected by color flow Doppler.  LEFT VENTRICLE PLAX 2D LVIDd:         4.60 cm      Diastology LVIDs:         3.00 cm      LV e' medial:    7.29 cm/s LV PW:         1.40 cm      LV E/e' medial:  16.7 LV IVS:        0.70 cm      LV e' lateral:   9.03 cm/s LVOT diam:     2.20 cm      LV E/e' lateral: 13.5 LV SV:         89 LV SV Index:   49 LVOT Area:     3.80 cm  LV Volumes (MOD) LV vol d, MOD A2C: 119.0 ml LV vol d, MOD A4C: 151.0 ml LV vol s, MOD A2C: 38.3 ml LV vol s, MOD A4C: 61.0 ml LV SV MOD A2C:     80.7 ml LV SV MOD A4C:     151.0 ml LV SV MOD BP:      85.0 ml RIGHT VENTRICLE RV S prime:     10.90 cm/s TAPSE (M-mode): 1.9 cm LEFT ATRIUM           Index LA Vol (A2C): 91.9 ml 50.58 ml/m LA Vol (A4C): 85.2 ml 46.89 ml/m  AORTIC VALVE AV Area (Vmax):    2.92 cm AV Area (Vmean):   2.81 cm AV Area (VTI):     2.72 cm AV Vmax:           146.00 cm/s AV Vmean:          91.700 cm/s AV VTI:            0.328 m  AV Peak Grad:      8.5 mmHg AV Mean Grad:      4.0 mmHg LVOT Vmax:         112.00 cm/s LVOT Vmean:        67.700 cm/s LVOT VTI:          0.235 m LVOT/AV VTI ratio: 0.72  AORTA Ao Asc diam: 4.60 cm MITRAL VALVE  MV Area (PHT): 2.84 cm     SHUNTS MV Area VTI:   1.95 cm     Systemic VTI:  0.24 m MV Peak grad:  5.7 mmHg     Systemic Diam: 2.20 cm MV Mean grad:  3.0 mmHg MV Vmax:       1.19 m/s MV Vmean:      75.2 cm/s MV Decel Time: 267 msec MV E velocity: 122.00 cm/s MV A velocity: 116.00 cm/s MV E/A ratio:  1.05 Joshua Gathers MD Electronically signed by Joshua Gathers MD Signature Date/Time: 11/10/2023/1:22:42 PM    Final    Medications:  heparin  1,250 Units/hr (11/11/23 2118)   meropenem  (MERREM ) IV 1 g (11/11/23 2325)    (feeding supplement) PROSource Plus  30 mL Oral TID BM   atorvastatin  20 mg Oral Daily   calcitRIOL   0.5 mcg Oral Q T,Th,Sa-HD   Chlorhexidine  Gluconate Cloth  6 each Topical Q0600   darbepoetin (ARANESP ) injection - DIALYSIS  60 mcg Subcutaneous Q Tue-1800   dorzolamide   1 drop Right Eye QHS   ibuprofen  800 mg Oral TID   midodrine   15 mg Oral TID   pantoprazole   40 mg Oral BID   sevelamer  carbonate  1.6 g Oral TID WC   sodium chloride  flush  5 mL Intracatheter Q8H   thiamine (VITAMIN B1) injection  100 mg Intravenous Daily   timolol   1 drop Both Eyes BID   vancomycin  variable dose per unstable renal function (pharmacist dosing)   Does not apply See admin instructions    Dialysis Orders:  [facility]-MWF 2:45hrs EDW 71kg BFR 350, DFR Auto 1.5 2K/2.5Ca Heparin  No bolus ordered Mircera 120 mcg q4wks (new order). 50mcg last given on 10/26/23 Calcitriol  0.5mcg PO qHD - last dose 11/06/23 Midodrine  15mg  TID Renvela  Powder 1600mg  TID with meals   Last Labs: Hgb 9.2,  K 4.2, Ca 9.1, P 7.1,  Alb 2.9   Assessment/Plan: Chest pain - Cardiology following; being treated for pericarditis with short course NSAIDs Recurrent presacral fluid collection - General surgery consulted - IR 5/7 drain placement.  On vanc and meropenem  currently.  D/w ID - plan is course of abx through 11/24/23.  Based on prior cultures will need ertapenem  1g post HD three times per week - HD unit applied  for nonforumlary med, need to await delivery date prior to d/c.  Will need vanc as well.  ESRD -  on HD MWF. Last HD 5/2. Plan for HD today off schedule - will resume outpt schedule at some point prior to d/c, likely TTS this week then MWF next week - ID aware off schedule. Appears he's been leaving under EDW.  Chronic hypotension - continue Midodrine  15mg  TID Volume  - He's frail and appears he's been leaving under EDW at outpatient dialysis. Monitor closely. Will adjust EDW at discharge for accuracy Anemia of CKD - Hgb 8-9s, resumed ESA here Secondary Hyperparathyroidism -  Ca okay but phos is high. Resumed binders and VDRA Nutrition - Renal diet with fluid restriction. Protein supplements for low Alb  Heidi Llamas  Higinio Love MD 11/12/2023, 7:47 AM  [facility] Pager: [phone]

## 2023-11-12 NOTE — Progress Notes (Signed)
 Chief Complaint: Patient was seen today for pelvic fluid collection  Referring Physician(s): Marlin Simmonds, PA-C  Supervising Physician: Creasie Doctor  Patient Status: [facility] - In-pt  Subjective: 11/12/23: Patient reports pain at the drain site, which has improved since onset. Denies: chest pain, shortness of breath, vomiting, nausea, abdominal pain, diarrhea.   Objective: Physical Exam: BP (!) 92/55   Pulse 75   Temp (!) 97.5 F (36.4 C)   Resp (!) 21   Ht 5\' 8"  (1.727 m)   Wt 153 lb 10.6 oz (69.7 kg)   SpO2 100%   BMI 23.36 kg/m  Constitutional: adult male, alert, sitting upright in bed, currently receiving dialysis. Skin: drain site is non TTP. No surrounding erythema or edema. Suture in place. 5-10 cc serosanguinous fluid present in bulb.  Chest: symmetric rise and fall of chest. Pulm: breathing comfortably on room air. No evidence of respiratory distress.  Neuro: alert and oriented.  Psych: normal insight and judgement. Non pressured speech.      Current Facility-Administered Medications:    (feeding supplement) PROSource Plus liquid 30 mL, 30 mL, Oral, TID BM, Kitty Perkins, NP, 30 mL at 11/11/23 2110   acetaminophen  (TYLENOL ) tablet 650 mg, 650 mg, Oral, Q4H PRN, Lavanda Porter, MD, 650 mg at 11/11/23 2359   albuterol  (PROVENTIL ) (2.5 MG/3ML) 0.083% nebulizer solution 2.5 mg, 2.5 mg, Nebulization, Q6H PRN, Lavanda Porter, MD   apixaban  (ELIQUIS ) tablet 2.5 mg, 2.5 mg, Oral, BID, Armenta Landau, MD   atorvastatin (LIPITOR) tablet 20 mg, 20 mg, Oral, Daily, Goodrich, Callie E, PA-C, 20 mg at 11/12/23 1610   calcitRIOL  (ROCALTROL ) capsule 0.5 mcg, 0.5 mcg, Oral, Q T,Th,Sa-HD, Kitty Perkins, NP, 0.5 mcg at 11/12/23 1217   calcium carbonate (dosed in mg elemental calcium) suspension 500 mg of elemental calcium, 500 mg of elemental calcium, Oral, Q6H PRN, Armenta Landau, MD   camphor-menthol  Eyvonne Hollering) lotion, , Topical, PRN, Armenta Landau, MD    Chlorhexidine  Gluconate Cloth 2 % PADS 6 each, 6 each, Topical, Q0600, Kruska, Lindsay A, MD, 6 each at 11/12/23 0524   Darbepoetin Alfa  (ARANESP ) injection 60 mcg, 60 mcg, Subcutaneous, Q Tue-1800, Kitty Perkins, NP   docusate sodium  [facility]) enema 283 mg, 1 enema, Rectal, PRN, Armenta Landau, MD   dorzolamide  (TRUSOPT ) 2 % ophthalmic solution 1 drop, 1 drop, Right Eye, QHS, Regalado, Belkys A, MD, 1 drop at 11/11/23 2111   feeding supplement (NEPRO CARB STEADY) liquid 237 mL, 237 mL, Oral, TID PRN, Armenta Landau, MD   folic acid  (FOLVITE ) tablet 1 mg, 1 mg, Oral, Daily, Armenta Landau, MD   hydrOXYzine  (ATARAX ) tablet 25 mg, 25 mg, Oral, TID PRN, Armenta Landau, MD, 25 mg at 11/11/23 2125   ibuprofen (ADVIL) tablet 800 mg, 800 mg, Oral, TID, Eilleen Grates, MD, 800 mg at 11/11/23 2110   loperamide (IMODIUM) capsule 2 mg, 2 mg, Oral, PRN, Armenta Landau, MD   meropenem  (MERREM ) 1 g in sodium chloride  0.9 % 100 mL IVPB, 1 g, Intravenous, Q24H, Hall, Carole N, DO, Last Rate: 200 mL/hr at 11/11/23 2325, 1 g at 11/11/23 2325   midodrine  (PROAMATINE ) tablet 15 mg, 15 mg, Oral, TID, Hall, Carole N, DO, 15 mg at 11/12/23 0817   nitroGLYCERIN (NITROSTAT) SL tablet 0.4 mg, 0.4 mg, Sublingual, Q5 min PRN, Rosealee Concha, MD   ondansetron  (ZOFRAN ) injection 4 mg, 4 mg, Intravenous, Q6H PRN, Lavanda Porter, MD   Oral  care mouth rinse, 15 mL, Mouth Rinse, PRN, Lavanda Porter, MD   pantoprazole  (PROTONIX ) EC tablet 40 mg, 40 mg, Oral, BID, Regalado, Belkys A, MD, 40 mg at 11/12/23 0818   polyethylene glycol (MIRALAX / GLYCOLAX) packet 17 g, 17 g, Oral, Daily PRN, [place], Carole N, DO   sevelamer  carbonate (RENVELA ) packet 1.6 g, 1.6 g, Oral, TID WC, Jadene Maxwell E, NP, 1.6 g at 11/11/23 1628   sodium chloride  flush (NS) 0.9 % injection 5 mL, 5 mL, Intracatheter, Q8H, Wagner, Jaime, DO, 5 mL at 11/12/23 0525   sorbitol  70 % solution 30 mL, 30 mL, Oral, PRN, Armenta Landau,  MD   thiamine (VITAMIN B1) injection 100 mg, 100 mg, Intravenous, Daily, Brunilda Capra V, MD, 100 mg at 11/11/23 1610   timolol  (TIMOPTIC ) 0.5 % ophthalmic solution 1 drop, 1 drop, Both Eyes, BID, Regalado, Belkys A, MD, 1 drop at 11/11/23 2111   vancomycin  variable dose per unstable renal function (pharmacist dosing), , Does not apply, See admin instructions, Armenta Landau, MD  Labs: CBC Recent Labs    11/11/23 0706 11/12/23 0900  WBC 10.6* 9.5  HGB 9.3* 8.6*  HCT 28.2* 26.3*  PLT 204 206   BMET Recent Labs    11/11/23 0706 11/12/23 0900  NA 133* 128*  K 4.2 4.7  CL 92* 93*  CO2 23 19*  GLUCOSE 89 153*  BUN 49* 72*  CREATININE 7.34* 8.98*  CALCIUM 9.1 8.7*   LFT Recent Labs    11/09/23 1804 11/10/23 1148 11/12/23 0900  PROT 6.5  --   --   ALBUMIN  2.6*   < > 2.8*  AST 16  --   --   ALT 32  --   --   ALKPHOS 77  --   --   BILITOT 1.1  --   --    < > = values in this interval not displayed.   PT/INR Recent Labs    11/11/23 0005  LABPROT 16.3*  INR 1.3*     Studies/Results: CT GUIDED PERITONEAL/RETROPERITONEAL FLUID DRAIN BY PERC CATH Result Date: 11/11/2023 INDICATION: 88 year old male with a history of presacral fluid collection referred for drainage EXAM: CT-GUIDED ABSCESS DRAIN TECHNIQUE: Multidetector CT imaging of the pelvis was performed following the standard protocol without IV contrast. RADIATION DOSE REDUCTION: This exam was performed according to the departmental dose-optimization program which includes automated exposure control, adjustment of the mA and/or kV according to patient size and/or use of iterative reconstruction technique. MEDICATIONS: The patient is currently admitted to the hospital and receiving intravenous antibiotics. The antibiotics were administered within an appropriate time frame prior to the initiation of the procedure. ANESTHESIA/SEDATION: Moderate (conscious) sedation was employed during this procedure. A total of Versed  1.0  mg and Fentanyl  50 mcg was administered intravenously by the radiology nurse. Total intra-service moderate Sedation Time: 14 minutes. The patient's level of consciousness and vital signs were monitored continuously by radiology nursing throughout the procedure under my direct supervision. COMPLICATIONS: None PROCEDURE: Informed written consent was obtained from the patient after a thorough discussion of the procedural risks, benefits and alternatives. All questions were addressed. Maximal Sterile Barrier Technique was utilized including caps, mask, sterile gowns, sterile gloves, sterile drape, hand hygiene and skin antiseptic. A timeout was performed prior to the initiation of the procedure. Patient was positioned right decubitus on the CT gantry table. Scout CT acquired for planning purposes. The patient is prepped and draped in the usual sterile fashion. 1% lidocaine   was used for local anesthesia. Using CT guidance, a Yueh needle was advanced into the presacral location from a left transgluteal approach. Once we confirmed the catheter position modified Seldinger technique was used to place a 10 [place] drain. Approximately 20 cc of frankly purulent material aspirated for culture. Drain was attached to bulb suction. Final image was stored. Patient tolerated the procedure well and remained hemodynamically stable throughout. No complications were encountered and no significant blood loss. IMPRESSION: Status post CT-guided left transgluteal drain for presacral abscess. Signed, Marciano Settles. Rexine Cater, RPVI [facility] Electronically Signed   By: Myrlene Asper D.O.   On: 11/11/2023 17:10    Assessment/Plan: Pelvic Fluid Collection   Drain Location: left trans-gluteal drain placement, presacral.  Size: Fr size: 10 Fr Date of placement: 11/11/23 Currently to: Drain collection device: suction bulb 24 hour output:  Output by Drain (mL) 11/10/23 0700 -  11/10/23 1459 11/10/23 1500 - 11/10/23 2259 11/10/23 2300 - 11/11/23 0659 11/11/23 0700 - 11/11/23 1459 11/11/23 1500 - 11/11/23 2259 11/11/23 2300 - 11/12/23 0659 11/12/23 0700 - 11/12/23 1309  Closed System Drain Inferior;Lateral;Left Back Bulb (JP) 10 Fr.      25     Interval imaging/drain manipulation:  TBD  Current examination: Afebrile.  Flushes easily with 3 mL syringe. Does not aspirate well.  Insertion site unremarkable. Suture and stat lock in place. Dressed appropriately.   Plan: Continue TID flushes with 5 cc NS.  Utilize a syringe smaller than 10 cc.  Record output Q shift. Dressing changes QD or PRN if soiled.  Call IR APP or on call IR MD if difficulty flushing or sudden change in drain output.  Repeat imaging/possible drain injection once output < 10 mL/QD (excluding flush material). Consideration for drain removal if output is < 10 mL/QD (excluding flush material), pending discussion with the providing surgical service.  Discharge planning: Please contact IR APP or on call IR MD prior to patient d/c to ensure appropriate follow up plans are in place. Typically patient will follow up with IR clinic 10-14 days post d/c for repeat imaging/possible drain injection. IR scheduler will contact patient with date/time of appointment. Patient will need to flush drain QD with 5 cc NS, record output QD, dressing changes every 2-3 days or earlier if soiled.   IR will continue to follow - please call with questions or concerns.       LOS: 3 days   I spent a total of 15 minutes in face to face in clinical consultation, greater than 50% of which was counseling/coordinating care for pelvic fluid collection   Trice Aspinall N Redford Behrle PA-C 11/12/2023 1:09 PM

## 2023-11-12 NOTE — Progress Notes (Addendum)
   11/12/23 1245  Vitals  Temp (!) 97.5 F (36.4 C)  Pulse Rate 75  Resp (!) 21  BP (!) 92/55  SpO2 100 %  O2 Device Room Air  Weight 69.7 kg  Type of Weight Post-Dialysis  Oxygen Therapy  Patient Activity (if Appropriate) In bed  Pulse Oximetry Type Continuous  Oximetry Probe Site Changed No  Post Treatment  Dialyzer Clearance Lightly streaked  Hemodialysis Intake (mL) 0 mL  Liters Processed 57.5  Fluid Removed (mL) 500 mL  Tolerated HD Treatment Yes  Post-Hemodialysis Comments Pt had prolonged bleeding in arterial portion of femoral graft-hemostasis achieved. MD-Kruska notified concerning prolonged bleeding.  AVG/AVF Arterial Site Held (minutes) 10 minutes  AVG/AVF Venous Site Held (minutes) 5 minutes    Received patient in bed to unit.  Alert and oriented.  Informed consent signed and in chart.   TX duration: 2 hours and 45 minutes.  Patient tolerated well.  Transported back to the room  Alert, without acute distress.  Hand-off given to patient's nurse.   Access used: Left femoral graft Access issues: None  Medication(s) given: Calcitriol  0.5mcg

## 2023-11-12 NOTE — Progress Notes (Signed)
 PROGRESS NOTE    Joshua Riley  XBM:[id number] DOB: [birth year] DOA: 11/08/2023 PCP: Almira Jaeger, MD    Chief Complaint  Patient presents with   Chest Pain    Brief Narrative:  88 year old with past medical history significant for ESRD on hemodialysis MWF with, paroxysmal A-fib, depression, obstructive uropathy with chronic left-sided percutaneous nephrostomy, ulcerative colitis status post colectomy 2011 with ileostomy, ileal pouch, Annelle anastomosis surgery 2012, chronic hypotension, anemia, history of DVT, sepsis, groin abscess recently admitted on March for abdominal pain found to have a small bowel obstruction presented to [facility] the day prior to admission complaining of chest pain, heaviness started 1 hour prior to arrival.   During evaluation in the ED troponin were elevated 428, EKG showed A-fib right bundle branch block.  Cardiology was consulted and think patient has pericarditis.  Patient was started on ibuprofen.   Patient was also found to have presacral peripherally enhancing fluid collection slightly increased in size compared to 09/06/2023 now measuring 4.7 x 5.5 cm.  Fluid collection extend into the left left ischial and and off also.  The presacral fluid collection abuts the anterior cortex of the sacrum which demonstrated mild irregularity suspicious for osteomyelitis.  General surgery consulted recommended IR evaluation for drain placement.  ID consulted for antibiotic duration and recommendations.    Assessment & Plan:   Principal Problem:   Chest pain Active Problems:   Acute pericarditis   End-stage renal disease on hemodialysis (HCC)   Atrial fibrillation, persistent (HCC)   Ulcerative colitis (HCC)   Orthostatic hypotension   Nephrostomy present (HCC)   DOE (dyspnea on exertion)   History of anemia due to chronic kidney disease   Sacral osteomyelitis (HCC)  #1 chest pain, concern for acute pericarditis -Patient noted to have presented with chest  pain with elevated troponins. - 2D echo obtained with EF of 55 to 60%,NWMA, grade 1 diastolic dysfunction, aortic valve sclerosis with no evidence of aortic valve stenosis.  Aorta measured 40 mm. - Patient currently clinically improved on short course of NSAIDs and denies any chest pain. -Patient seen in consultation by cardiology and placed on ibuprofen to treat pericarditis. - Continue PPI for GI prophylaxis while on NSAIDs. - Per cardiology.  2.  Presacral fluid collection, sacral osteomyelitis -Patient with prior history of presacral fluid collection noted to have had multiple drains placed in the past. - It is noted in the past that patient was not a candidate for surgery. - Patient noted to have completed 6 weeks of antibiotics in January 2025. - CT done with worsening fluid collection concern for osteomyelitis. - Blood cultures with no growth to date. - Patient seen in consultation by general surgery who spoke with Dr. Genita Keys, colorectal surgeon at [facility] who has been following the patient and it is noted that no surgical options for this problem at this time per general surgery. - General Surgery recommended drain placement by IR which was done, 11/11/2023, with cultures pending. - Continue IV vancomycin , IV Merrem . - ID consulted for antibiotic duration and recommendations and recommending continuation of IV vancomycin  IV Merrem  while in-house and on discharge will likely need IV vancomycin  IV and IV ertapenem  which could be given during hemodialysis. - Appreciate general surgery and ID input and recommendations.  3.  Chronic orthostatic hypotension -Status post IV albumin  during the hospitalization with systolic blood pressures in the 80s. -Cortisol level noted at 12.8. - Continue current antibiotic treatment for problem #2. - Continue current dose of midodrine  at  15 mg 3 times daily.  4.  Chronic dyspnea -Chronic problem ongoing off and on.  Some concern for deconditioning chronic  hypotension. -2D echo obtained with EF of 55 to 60%,NWMA, grade 1 diastolic dysfunction, aortic valve sclerosis with no evidence of aortic valve stenosis.  Aorta measured 40 mm.  5.  ESRD on HD Monday Wednesday Friday - Midodrine .   - Per nephrology.   6.  Anemia of chronic kidney disease -Follow H&H. - Transfusion threshold hemoglobin < 7.  7.  History of A-fib - Rate controlled.   - Was on IV heparin  post drain placement we will transition to Eliquis  today.   8.  History of ulcerative colitis/status post colectomy 2011 with ileostomy, ileal pouch anal anastomosis surgery 2012/history of SBO -Currently stable. - Good ostomy output.  - C. difficile PCR negative. -Imodium as needed.  9.  History of obstructive uropathy with chronic left PCN in place -Outpatient follow-up with urology.  10.  Abdominal pain -Patient have some complaints of abdominal pain differential of cholelithiasis versus a vascular etiology. - Noted with severe narrowing of the celiac origin secondary to atherosclerotic calcified plaque along with a calcified plaque at the SMA without hemodynamically significant stenosis. - Patient with surgical grafts in the left common femoral artery and vein in the subcutaneous ventral left thigh. - Right upper quadrant ultrasound with cholelithiasis, gallbladder wall thickening, fatty infiltration of the liver. - C. difficile PCR negative. - Follow-up.  11.  Irregular asymmetric left bladder wall thickening similar to prior -Outpatient follow-up with urology.  DVT prophylaxis: Heparin  drip Code Status: Full Family Communication: Updated patient.  No family at bedside. Disposition: TBD  Status is: Inpatient Remains inpatient appropriate because: Severity of illness   Consultants:  Interventional radiology: Dr. Julietta Ogren 11/10/2023 General Surgery: Dr. Aniceto Barley 11/10/2023 Cardiology: Dr. Lavonne Prairie 11/09/2023 Wound care RN ID: Dr. Zelda Hickman 11/11/2023  Procedures:  CT angiogram chest  abdomen and pelvis 11/09/2023 Chest x-ray 11/08/2023 Right upper quadrant ultrasound 11/09/2023 2D echo 11/10/2023 Image guided left transgluteal drain placement, presacral 10 French pigtail drain per IR: Dr. Mabel Savage 11/11/2023  Antimicrobials:  Anti-infectives (From admission, onward)    Start     Dose/Rate Route Frequency Ordered Stop   11/12/23 1100  vancomycin  (VANCOREADY) IVPB 750 mg/150 mL        750 mg 150 mL/hr over 60 Minutes Intravenous  Once 11/12/23 0840     11/11/23 1200  vancomycin  (VANCOREADY) IVPB 750 mg/150 mL  Status:  Discontinued        750 mg 150 mL/hr over 60 Minutes Intravenous Every M-W-F (Hemodialysis) 11/09/23 2017 11/11/23 1142   11/11/23 1144  vancomycin  variable dose per unstable renal function (pharmacist dosing)         Does not apply See admin instructions 11/11/23 1144     11/10/23 2200  meropenem  (MERREM ) 1 g in sodium chloride  0.9 % 100 mL IVPB        1 g 200 mL/hr over 30 Minutes Intravenous Every 24 hours 11/09/23 0753     11/10/23 1830  vancomycin  (VANCOREADY) IVPB 750 mg/150 mL        750 mg 150 mL/hr over 60 Minutes Intravenous STAT 11/10/23 1816 11/10/23 2109   11/09/23 0600  meropenem  (MERREM ) 1 g in sodium chloride  0.9 % 100 mL IVPB  Status:  Discontinued        1 g 200 mL/hr over 30 Minutes Intravenous Every 8 hours 11/09/23 0146 11/09/23 0753   11/09/23 0330  vancomycin  (VANCOCIN ) 500  mg in sodium chloride  0.9 % 100 mL IVPB       Placed in "Followed by" Linked Group   500 mg 100 mL/hr over 60 Minutes Intravenous  Once 11/09/23 0220 11/09/23 0525   11/09/23 0230  vancomycin  (VANCOCIN ) IVPB 1000 mg/200 mL premix       Placed in "Followed by" Linked Group   1,000 mg 200 mL/hr over 60 Minutes Intravenous  Once 11/09/23 0220 11/09/23 0341   11/09/23 0200  vancomycin  (VANCOREADY) IVPB 1500 mg/300 mL  Status:  Discontinued        1,500 mg 150 mL/hr over 120 Minutes Intravenous  Once 11/09/23 0146 11/09/23 0220         Subjective: Patient seen in  hemodialysis.  Denies any chest pain or shortness of breath.  No abdominal pain.  Stated had some pain around drain site yesterday which has since improved.  Denies any lightheadedness or dizziness.   Objective: Vitals:   11/12/23 0945 11/12/23 0948 11/12/23 1015 11/12/23 1030  BP: (!) 86/50 (!) 82/46 (!) 83/46 (!) 90/58  Pulse: 69 69 66 94  Resp: 14 12 14 18   Temp:      TempSrc:      SpO2: 99% 92% 100% 99%  Weight:      Height:        Intake/Output Summary (Last 24 hours) at 11/12/2023 1136 Last data filed at 11/12/2023 0818 Gross per 24 hour  Intake 973.24 ml  Output 1240 ml  Net -266.76 ml   Filed Weights   11/10/23 1830 11/11/23 0511 11/12/23 0900  Weight: 70 kg 68.6 kg 70.2 kg    Examination:  General exam: NAD Respiratory system: CTAB anterior lung fields.  No wheezes, no crackles, no rhonchi.  Fair air movement.   Cardiovascular system: Irregularly irregular. No JVD, murmurs, rubs, gallops or clicks. No pedal edema. Gastrointestinal system: Abdomen is soft, nontender, nondistended, positive bowel sounds.  Ileostomy bag with stool noted.  No rebound.  No guarding.   -JP drain with serosanguineous drainage noted. Central nervous system: Alert and oriented. No focal neurological deficits. Extremities: Symmetric 5 x 5 power. Skin: No rashes, lesions or ulcers Psychiatry: Judgement and insight appear normal. Mood & affect appropriate.     Data Reviewed: I have personally reviewed following labs and imaging studies  CBC: Recent Labs  Lab 11/08/23 2240 11/10/23 0347 11/11/23 0005 11/11/23 0706  WBC 15.8* 13.2* 9.7 10.6*  HGB 9.8* 9.2* 8.4* 9.3*  HCT 29.0* 27.2* 24.9* 28.2*  MCV 116.5* 115.3* 115.3* 116.0*  PLT 197 226 204 204    Basic Metabolic Panel: Recent Labs  Lab 11/08/23 2240 11/09/23 1804 11/10/23 1148 11/11/23 0706  NA 136 132* 131* 133*  K 3.7 4.4 4.2 4.2  CL 92* 94* 93* 92*  CO2 25 20* 19* 23  GLUCOSE 167* 137* 102* 89  BUN 67* 78* 95* 49*   CREATININE 10.10* 11.24* 11.83* 7.34*  CALCIUM 10.2 8.9 9.1 9.1  MG  --  2.2  --   --   PHOS  --   --  7.1*  --     GFR: Estimated Creatinine Clearance: 7.1 mL/min (A) (by C-G formula based on SCr of 7.34 mg/dL (H)).  Liver Function Tests: Recent Labs  Lab 11/09/23 1804 11/10/23 1148  AST 16  --   ALT 32  --   ALKPHOS 77  --   BILITOT 1.1  --   PROT 6.5  --   ALBUMIN  2.6* 2.9*  CBG: No results for input(s): "GLUCAP" in the last 168 hours.   Recent Results (from the past 240 hours)  Culture, blood (Routine X 2) w Reflex to ID Panel     Status: None (Preliminary result)   Collection Time: 11/09/23  9:52 PM   Specimen: BLOOD  Result Value Ref Range Status   Specimen Description BLOOD BLOOD RIGHT ARM  Final   Special Requests   Final    BOTTLES DRAWN AEROBIC AND ANAEROBIC Blood Culture results may not be optimal due to an inadequate volume of blood received in culture bottles   Culture   Final    NO GROWTH 3 DAYS Performed at [facility], [address] [place], [place] [address]    Report Status PENDING  Incomplete  Culture, blood (Routine X 2) w Reflex to ID Panel     Status: None (Preliminary result)   Collection Time: 11/09/23  9:52 PM   Specimen: BLOOD  Result Value Ref Range Status   Specimen Description BLOOD BLOOD RIGHT HAND  Final   Special Requests   Final    BOTTLES DRAWN AEROBIC AND ANAEROBIC Blood Culture results may not be optimal due to an inadequate volume of blood received in culture bottles   Culture   Final    NO GROWTH 3 DAYS Performed at [facility], [address], [place], [place] [address]    Report Status PENDING  Incomplete  C Difficile Quick Screen w PCR reflex     Status: None   Collection Time: 11/10/23 10:53 AM   Specimen: STOOL  Result Value Ref Range Status   C Diff antigen NEGATIVE NEGATIVE Final   C Diff toxin NEGATIVE NEGATIVE Final   C Diff interpretation No C. difficile detected.  Final    Comment: Performed  at [facility], [address] [place], [place] [address]  Aerobic/Anaerobic Culture w Gram Stain (surgical/deep wound)     Status: None (Preliminary result)   Collection Time: 11/11/23 11:10 AM   Specimen: Abdomen; Abscess  Result Value Ref Range Status   Specimen Description ABDOMEN  Final   Special Requests NONE  Final   Gram Stain   Final    ABUNDANT WBC PRESENT, PREDOMINANTLY PMN FEW GRAM NEGATIVE RODS RARE GRAM POSITIVE COCCI    Culture   Final    CULTURE REINCUBATED FOR BETTER GROWTH Performed at [facility], [address] [place], [place] [address]    Report Status PENDING  Incomplete         Radiology Studies: CT GUIDED PERITONEAL/RETROPERITONEAL FLUID DRAIN BY PERC CATH Result Date: 11/11/2023 INDICATION: 88 year old male with a history of presacral fluid collection referred for drainage EXAM: CT-GUIDED ABSCESS DRAIN TECHNIQUE: Multidetector CT imaging of the pelvis was performed following the standard protocol without IV contrast. RADIATION DOSE REDUCTION: This exam was performed according to the departmental dose-optimization program which includes automated exposure control, adjustment of the mA and/or kV according to patient size and/or use of iterative reconstruction technique. MEDICATIONS: The patient is currently admitted to the hospital and receiving intravenous antibiotics. The antibiotics were administered within an appropriate time frame prior to the initiation of the procedure. ANESTHESIA/SEDATION: Moderate (conscious) sedation was employed during this procedure. A total of Versed  1.0 mg and Fentanyl  50 mcg was administered intravenously by the radiology nurse. Total intra-service moderate Sedation Time: 14 minutes. The patient's level of consciousness and vital signs were monitored continuously by radiology nursing throughout the procedure under my  direct supervision. COMPLICATIONS: None PROCEDURE: Informed written consent was obtained from the patient  after a thorough discussion of the procedural risks, benefits and alternatives. All questions were addressed. Maximal Sterile Barrier Technique was utilized including caps, mask, sterile gowns, sterile gloves, sterile drape, hand hygiene and skin antiseptic. A timeout was performed prior to the initiation of the procedure. Patient was positioned right decubitus on the CT gantry table. Scout CT acquired for planning purposes. The patient is prepped and draped in the usual sterile fashion. 1% lidocaine  was used for local anesthesia. Using CT guidance, a Yueh needle was advanced into the presacral location from a left transgluteal approach. Once we confirmed the catheter position modified Seldinger technique was used to place a 10 [place] drain. Approximately 20 cc of frankly purulent material aspirated for culture. Drain was attached to bulb suction. Final image was stored. Patient tolerated the procedure well and remained hemodynamically stable throughout. No complications were encountered and no significant blood loss. IMPRESSION: Status post CT-guided left transgluteal drain for presacral abscess. Signed, Marciano Settles. Rexine Cater, RPVI [facility] Electronically Signed   By: Myrlene Asper D.O.   On: 11/11/2023 17:10   ECHOCARDIOGRAM COMPLETE Result Date: 11/10/2023    ECHOCARDIOGRAM REPORT   Patient Name:   Marshel Rolnick Date of Exam: 11/10/2023 Medical Rec #:  [id number]       Height:       68.0 in Accession #:    [id number]      Weight:       151.7 lb Date of Birth:  [birth year]       BSA:          1.817 m Patient Age:    87 years        BP:           89/59 mmHg Patient Gender: M               HR:           58 bpm. Exam Location:  Inpatient Procedure: 2D Echo, Color Doppler and Cardiac Doppler (Both Spectral and Color            Flow Doppler were utilized during procedure). Indications:    R07.9* Chest pain, unspecified  History:        Patient has prior  history of Echocardiogram examinations, most                 recent 05/27/2022.  Sonographer:    Andrena Bang Referring Phys: CALLIE E GOODRICH IMPRESSIONS  1. Left ventricular ejection fraction, by estimation, is 55 to 60%. The left ventricle has normal function. The left ventricle has no regional wall motion abnormalities. Left ventricular diastolic parameters are consistent with Grade I diastolic dysfunction (impaired relaxation).  2. Right ventricular systolic function is normal. The right ventricular size is moderately enlarged.  3. The mitral valve is normal in structure. No evidence of mitral valve regurgitation. No evidence of mitral stenosis.  4. Tricuspid valve regurgitation is moderate.  5. The aortic valve is calcified. There is mild calcification of the aortic valve. There is mild thickening of the aortic valve. Aortic valve regurgitation is mild. Aortic valve sclerosis is present, with no evidence of aortic valve stenosis. Aortic valve mean gradient measures 4.0 mmHg. Aortic valve Vmax measures 1.46 m/s.  6. Aorta measured 40 mm on CT chest 11/09/2023. Aortic dilatation noted. There is mild dilatation of the ascending aorta, measuring 40 mm.  7.  The inferior vena cava is normal in size with greater than 50% respiratory variability, suggesting right atrial pressure of 3 mmHg. FINDINGS  Left Ventricle: Left ventricular ejection fraction, by estimation, is 55 to 60%. The left ventricle has normal function. The left ventricle has no regional wall motion abnormalities. Definity contrast agent was given IV to delineate the left ventricular  endocardial borders. The left ventricular internal cavity size was normal in size. There is no left ventricular hypertrophy. Left ventricular diastolic parameters are consistent with Grade I diastolic dysfunction (impaired relaxation). Right Ventricle: The right ventricular size is moderately enlarged. No increase in right ventricular wall thickness. Right ventricular  systolic function is normal. Left Atrium: Left atrial size was normal in size. Right Atrium: Right atrial size was normal in size. Pericardium: There is no evidence of pericardial effusion. Mitral Valve: The mitral valve is normal in structure. No evidence of mitral valve regurgitation. No evidence of mitral valve stenosis. MV peak gradient, 5.7 mmHg. The mean mitral valve gradient is 3.0 mmHg. Tricuspid Valve: The tricuspid valve is normal in structure. Tricuspid valve regurgitation is moderate . No evidence of tricuspid stenosis. Aortic Valve: The aortic valve is calcified. There is mild calcification of the aortic valve. There is mild thickening of the aortic valve. Aortic valve regurgitation is mild. Aortic valve sclerosis is present, with no evidence of aortic valve stenosis. Aortic valve mean gradient measures 4.0 mmHg. Aortic valve peak gradient measures 8.5 mmHg. Aortic valve area, by VTI measures 2.72 cm. Pulmonic Valve: The pulmonic valve was normal in structure. Pulmonic valve regurgitation is trivial. No evidence of pulmonic stenosis. Aorta: Aorta measured 40 mm on CT chest 11/09/2023. Aortic dilatation noted. There is mild dilatation of the ascending aorta, measuring 40 mm. Venous: The inferior vena cava is normal in size with greater than 50% respiratory variability, suggesting right atrial pressure of 3 mmHg. IAS/Shunts: No atrial level shunt detected by color flow Doppler.  LEFT VENTRICLE PLAX 2D LVIDd:         4.60 cm      Diastology LVIDs:         3.00 cm      LV e' medial:    7.29 cm/s LV PW:         1.40 cm      LV E/e' medial:  16.7 LV IVS:        0.70 cm      LV e' lateral:   9.03 cm/s LVOT diam:     2.20 cm      LV E/e' lateral: 13.5 LV SV:         89 LV SV Index:   49 LVOT Area:     3.80 cm  LV Volumes (MOD) LV vol d, MOD A2C: 119.0 ml LV vol d, MOD A4C: 151.0 ml LV vol s, MOD A2C: 38.3 ml LV vol s, MOD A4C: 61.0 ml LV SV MOD A2C:     80.7 ml LV SV MOD A4C:     151.0 ml LV SV MOD BP:       85.0 ml RIGHT VENTRICLE RV S prime:     10.90 cm/s TAPSE (M-mode): 1.9 cm LEFT ATRIUM           Index LA Vol (A2C): 91.9 ml 50.58 ml/m LA Vol (A4C): 85.2 ml 46.89 ml/m  AORTIC VALVE AV Area (Vmax):    2.92 cm AV Area (Vmean):   2.81 cm AV Area (VTI):     2.72 cm AV Vmax:  146.00 cm/s AV Vmean:          91.700 cm/s AV VTI:            0.328 m AV Peak Grad:      8.5 mmHg AV Mean Grad:      4.0 mmHg LVOT Vmax:         112.00 cm/s LVOT Vmean:        67.700 cm/s LVOT VTI:          0.235 m LVOT/AV VTI ratio: 0.72  AORTA Ao Asc diam: 4.60 cm MITRAL VALVE MV Area (PHT): 2.84 cm     SHUNTS MV Area VTI:   1.95 cm     Systemic VTI:  0.24 m MV Peak grad:  5.7 mmHg     Systemic Diam: 2.20 cm MV Mean grad:  3.0 mmHg MV Vmax:       1.19 m/s MV Vmean:      75.2 cm/s MV Decel Time: 267 msec MV E velocity: 122.00 cm/s MV A velocity: 116.00 cm/s MV E/A ratio:  1.05 Dorothye Gathers MD Electronically signed by Dorothye Gathers MD Signature Date/Time: 11/10/2023/1:22:42 PM    Final         Scheduled Meds:  (feeding supplement) PROSource Plus  30 mL Oral TID BM   apixaban   2.5 mg Oral BID   atorvastatin  20 mg Oral Daily   calcitRIOL   0.5 mcg Oral Q T,Th,Sa-HD   Chlorhexidine  Gluconate Cloth  6 each Topical Q0600   darbepoetin (ARANESP ) injection - DIALYSIS  60 mcg Subcutaneous Q Tue-1800   dorzolamide   1 drop Right Eye QHS   folic acid   1 mg Oral Daily   ibuprofen  800 mg Oral TID   midodrine   15 mg Oral TID   pantoprazole   40 mg Oral BID   sevelamer  carbonate  1.6 g Oral TID WC   sodium chloride  flush  5 mL Intracatheter Q8H   thiamine (VITAMIN B1) injection  100 mg Intravenous Daily   timolol   1 drop Both Eyes BID   vancomycin  variable dose per unstable renal function (pharmacist dosing)   Does not apply See admin instructions   Continuous Infusions:  meropenem  (MERREM ) IV 1 g (11/11/23 2325)   vancomycin  750 mg (11/12/23 1056)     LOS: 3 days    Time spent: 35 minutes    Hilda Lovings,  MD [facility]   To contact the attending provider between 7A-7P or the covering provider during after hours 7P-7A, please log into the web site [email or web address] and access using universal [place] password for that web site. If you do not have the password, please call the hospital operator.  11/12/2023, 11:36 AM

## 2023-11-12 NOTE — Progress Notes (Signed)
 Pt says he bite his left thumb and now it is bleeding. Band-aide applied to thumb after cleaning with ns saline syringe and drying with gauze.

## 2023-11-12 NOTE — Progress Notes (Signed)
 Will follow drain cultures, cont IR drain.  Noted patient's request to not go to [facility] any further and be seen locally.  I have discussed this with Dr. Camilo Cella who has seen him in the past.  Unfortunately the patient has a complex problem and situation that would need to be managed at a tertiary care facility.  He could follow up at [facility] if he would like somewhere that may be closer than [facility], but given his chronic problem that is not likely to be corrected surgically due to complex problem, previous radiation, and complex past surgical history, this would not something our colorectal surgeons would manage here.  Leone Ralphs .now 11/12/2023

## 2023-11-12 NOTE — Progress Notes (Signed)
 Advised by [facility] staff this morning that iv ertapenem  was approved for pt with HD at d/c, Staff ordering medication and will advise navigator of delivery date once known. Update provided to attending, nephrologist, ID MD, and pharmacist. Will assist as needed.   Lauraine Polite Renal Navigator [phone]

## 2023-11-13 ENCOUNTER — Other Ambulatory Visit (HOSPITAL_COMMUNITY): Payer: Self-pay

## 2023-11-13 DIAGNOSIS — N189 Chronic kidney disease, unspecified: Secondary | ICD-10-CM | POA: Diagnosis not present

## 2023-11-13 DIAGNOSIS — I309 Acute pericarditis, unspecified: Secondary | ICD-10-CM | POA: Diagnosis not present

## 2023-11-13 DIAGNOSIS — R079 Chest pain, unspecified: Secondary | ICD-10-CM | POA: Diagnosis not present

## 2023-11-13 DIAGNOSIS — R0609 Other forms of dyspnea: Secondary | ICD-10-CM | POA: Diagnosis not present

## 2023-11-13 LAB — CBC
HCT: 26.6 % — ABNORMAL LOW (ref 39.0–52.0)
Hemoglobin: 8.8 g/dL — ABNORMAL LOW (ref 13.0–17.0)
MCH: 38.4 pg — ABNORMAL HIGH (ref 26.0–34.0)
MCHC: 33.1 g/dL (ref 30.0–36.0)
MCV: 116.2 fL — ABNORMAL HIGH (ref 80.0–100.0)
Platelets: 202 10*3/uL (ref 150–400)
RBC: 2.29 MIL/uL — ABNORMAL LOW (ref 4.22–5.81)
RDW: 15 % (ref 11.5–15.5)
WBC: 9 10*3/uL (ref 4.0–10.5)
nRBC: 0 % (ref 0.0–0.2)

## 2023-11-13 LAB — RENAL FUNCTION PANEL
Albumin: 2.7 g/dL — ABNORMAL LOW (ref 3.5–5.0)
Anion gap: 11 (ref 5–15)
BUN: 44 mg/dL — ABNORMAL HIGH (ref 8–23)
CO2: 26 mmol/L (ref 22–32)
Calcium: 8.9 mg/dL (ref 8.9–10.3)
Chloride: 94 mmol/L — ABNORMAL LOW (ref 98–111)
Creatinine, Ser: 5.62 mg/dL — ABNORMAL HIGH (ref 0.61–1.24)
GFR, Estimated: 9 mL/min — ABNORMAL LOW (ref >60)
Glucose, Bld: 109 mg/dL — ABNORMAL HIGH (ref 70–99)
Phosphorus: 3.8 mg/dL (ref 2.5–4.6)
Potassium: 5.1 mmol/L (ref 3.5–5.1)
Sodium: 131 mmol/L — ABNORMAL LOW (ref 135–145)

## 2023-11-13 MED ORDER — ERTAPENEM IV (FOR PTA / DISCHARGE USE ONLY): 1.0000 g | INTRAVENOUS | Status: DC

## 2023-11-13 MED ORDER — VANCOMYCIN IV (FOR PTA / DISCHARGE USE ONLY)
750.0000 mg | INTRAVENOUS | Status: DC
Start: 2023-11-13 — End: 2023-12-03

## 2023-11-13 MED ORDER — IBUPROFEN 800 MG PO TABS
800.0000 mg | ORAL_TABLET | Freq: Three times a day (TID) | ORAL | 0 refills | Status: AC
Start: 2023-11-13 — End: 2023-11-20
  Filled 2023-11-13: qty 21, 7d supply, fill #0

## 2023-11-13 MED ORDER — SALINE FLUSH 0.9 % IV SOLN
5.0000 mL | Freq: Every day | INTRAVENOUS | 1 refills | Status: DC
  Filled 2023-11-13: qty 150, 15d supply, fill #0

## 2023-11-13 MED ORDER — TRAMADOL HCL 50 MG PO TABS: 50.0000 mg | ORAL_TABLET | Freq: Four times a day (QID) | ORAL | Status: DC | PRN

## 2023-11-13 MED ORDER — NITROGLYCERIN 0.4 MG SL SUBL
0.4000 mg | SUBLINGUAL_TABLET | SUBLINGUAL | 0 refills | Status: DC | PRN
  Filled 2023-11-13: qty 25, 5d supply, fill #0

## 2023-11-13 MED ORDER — LOPERAMIDE HCL 2 MG PO CAPS
2.0000 mg | ORAL_CAPSULE | ORAL | 0 refills | Status: DC | PRN
  Filled 2023-11-13: qty 20, 5d supply, fill #0

## 2023-11-13 MED ORDER — ATORVASTATIN CALCIUM 20 MG PO TABS
20.0000 mg | ORAL_TABLET | Freq: Every day | ORAL | 0 refills | Status: DC
  Filled 2023-11-13: qty 90, 90d supply, fill #0

## 2023-11-13 MED ORDER — SODIUM CHLORIDE 0.9 % IV SOLN: 1.0000 g | INTRAVENOUS | Status: DC

## 2023-11-13 MED ORDER — TRAMADOL HCL 50 MG PO TABS
50.0000 mg | ORAL_TABLET | Freq: Three times a day (TID) | ORAL | 0 refills | Status: DC | PRN
  Filled 2023-11-13: qty 15, 5d supply, fill #0

## 2023-11-13 MED ORDER — MIDODRINE HCL 5 MG PO TABS
15.0000 mg | ORAL_TABLET | Freq: Three times a day (TID) | ORAL | 1 refills | Status: DC
  Filled 2023-11-13: qty 270, 30d supply, fill #0

## 2023-11-13 MED ORDER — MELATONIN 3 MG PO TABS
3.0000 mg | ORAL_TABLET | Freq: Every evening | ORAL | Status: DC | PRN
  Administered 2023-11-13: 3 mg via ORAL
  Filled 2023-11-13: qty 1

## 2023-11-13 MED ORDER — PANTOPRAZOLE SODIUM 40 MG PO TBEC
40.0000 mg | DELAYED_RELEASE_TABLET | Freq: Two times a day (BID) | ORAL | 1 refills | Status: DC
  Filled 2023-11-13: qty 55, 28d supply, fill #0

## 2023-11-13 MED ORDER — SODIUM CHLORIDE 0.9 % IV SOLN
1.0000 g | Freq: Once | INTRAVENOUS | Status: AC
  Administered 2023-11-13: 1 g via INTRAVENOUS
  Filled 2023-11-13 (×2): qty 1000

## 2023-11-13 NOTE — Progress Notes (Signed)
#  Ischial/anal fossa abscess SP drain placement with Cx+ ESB L ecoli on 11/11/23, sacral osteomyelitis  -Vanc and merrem  inpatient->vanc+ ertapenem  outpatient -see IR in a couple weeks for drain management -will extend abx till 5/28 ID appt, plan to continue abx till drain removed -ID will SO

## 2023-11-13 NOTE — TOC Progression Note (Signed)
 Transition of Care [facility]) - Progression Note    Patient Details  Name: Joshua Riley MRN: [id number] Date of Birth: 08-11-1935  Transition of Care [facility]) CM/SW Contact  Jennett Model, RN Phone Number: 11/13/2023, 2:44 PM  Clinical Narrative:    Per Daughter Renee ,she states patient is active with [facility] and would like for patient to continue with them for HHPT, HHOT, HHRN.  NCM left message with Amy with [facility].  Awaiting call back to confirm she got the message.        Expected Discharge Plan and Services                                               Social Determinants of Health (SDOH) Interventions SDOH Screenings   Food Insecurity: No Food Insecurity (11/09/2023)  Housing: Low Risk  (11/09/2023)  Transportation Needs: No Transportation Needs (11/09/2023)  Utilities: Not At Risk (11/09/2023)  Alcohol Screen: Low Risk  (10/26/2023)  Depression (PHQ2-9): Medium Risk (05/22/2023)  Financial Resource Strain: Low Risk  (10/26/2023)  Physical Activity: Insufficiently Active (10/26/2023)  Social Connections: Moderately Integrated (11/09/2023)  Stress: Stress Concern Present (10/26/2023)  Tobacco Use: Medium Risk (11/10/2023)    Readmission Risk Interventions    11/10/2023    3:46 PM 07/21/2023   12:26 PM  Readmission Risk Prevention Plan  Transportation Screening Complete Complete  PCP or Specialist Appt within 5-7 Days Complete   Home Care Screening Complete   Medication Review (RN CM) Complete   HRI or Home Care Consult  Complete  Social Work Consult for Recovery Care Planning/Counseling  Complete  Palliative Care Screening  Not Applicable  Medication Review Oceanographer)  Complete

## 2023-11-13 NOTE — Progress Notes (Addendum)
 PHARMACY CONSULT NOTE FOR:  OUTPATIENT  PARENTERAL ANTIBIOTIC THERAPY (OPAT)  Indication: Sacral osteomyelitis  Regimen: Vancomycin  750 mg IV every HD + Ertapenem  1 gm IV every HD  End date: 12/02/23  No formal OPAT will be done as patient will receive antibiotics with dialysis. Nephrology aware of plan and ordered ertapenem  for HD.     Thank you for allowing pharmacy to be a part of this patient's care.  Denson Flake, PharmD, BCPS, BCIDP Infectious Diseases Clinical Pharmacist Phone: [phone] 11/13/2023, 9:45 AM

## 2023-11-13 NOTE — Discharge Planning (Signed)
 [facility] Patient Discharge Orders- [facility] CLINIC: [facility]  Patient's name: Joshua Riley Admit/DC Dates: 11/08/2023 - 11/13/2023  Discharge Diagnoses: Ischial/anal fossa abscess and sacral osteomyelitis - s/p drain placement by IR; Cultures 5/7 (+) ESBL ecoli, on IV ABXs see below, Followed by ID and IR for f/u's Chest pain - likely 2nd pericarditis, on short course Ibuprofen  and PPI for GI prophylaxis while on NSAIDS Chronic orthostatic hypotension - Midodrine  raised to 15mg  TID  Aranesp : Given: No    Last Hgb: 8.8 PRBC's Given: No ESA dose for discharge: mircera 120 mcg IV q 2 weeks  IV Iron dose at discharge: resume weekly Venofer  Heparin  change: No  EDW Change: Yes New EDW: Lower to 70kg  Bath Change: No  Access intervention/Change: No  Calcitriol  change: No  Discharge Labs: Calcium  8.9  Phosphorus 3.8  Albumin  2.7  K+ 5.1  IV Antibiotics: Yes Details: Ischial/anal fossa abscess and sacral osteomyelitis: IV Vancomycin  750mg  and IV Ertapenem  1GM both with HD until 12/02/23 (review pharmacy note from 11/13/23)  On Coumadin?: No, on Eliquis    OTHER/APPTS/LAB ORDERS:    D/C Meds to be reconciled by nurse after every discharge.  Completed By: Jadene Maxwell, NP   Reviewed by: MD:______ RN_______

## 2023-11-13 NOTE — Progress Notes (Signed)
 Reviewed AVS with patient questions answered.

## 2023-11-13 NOTE — TOC Transition Note (Addendum)
 Transition of Care [facility]) - Discharge Note   Patient Details  Name: Joshua Riley MRN: [id number] Date of Birth: [birth year]  [facility]) CM/SW Contact:  Jennett Model, RN Phone Number: 11/13/2023, 4:32 PM   Clinical Narrative:    Patient is for dc today, daughter will be her in the room about an hour to transport him home. NCM asked renal navigator to call daughter.          Patient Goals and CMS Choice            Discharge Placement                       Discharge Plan and Services Additional resources added to the After Visit Summary for                                       Social Drivers of Health (SDOH) Interventions SDOH Screenings   Food Insecurity: No Food Insecurity (11/09/2023)  Housing: Low Risk  (11/09/2023)  Transportation Needs: No Transportation Needs (11/09/2023)  Utilities: Not At Risk (11/09/2023)  Alcohol Screen: Low Risk  (10/26/2023)  Depression (PHQ2-9): Medium Risk (05/22/2023)  Financial Resource Strain: Low Risk  (10/26/2023)  Physical Activity: Insufficiently Active (10/26/2023)  Social Connections: Moderately Integrated (11/09/2023)  Stress: Stress Concern Present (10/26/2023)  Tobacco Use: Medium Risk (11/10/2023)     Readmission Risk Interventions    11/10/2023    3:46 PM 07/21/2023   12:26 PM  Readmission Risk Prevention Plan  Transportation Screening Complete Complete  PCP or Specialist Appt within 5-7 Days Complete   Home Care Screening Complete   Medication Review (RN CM) Complete   HRI or Home Care Consult  Complete  Social Work Consult for Recovery Care Planning/Counseling  Complete  Palliative Care Screening  Not Applicable  Medication Review Oceanographer)  Complete

## 2023-11-13 NOTE — Discharge Instructions (Signed)
 Interventional Radiology Percutaneous Abscess Drain Placement After Care   This sheet gives you information about how to care for yourself after your procedure. Your health care provider may also give you more specific instructions. Your drain was placed by an interventional radiologist with [facility]. If you have questions or concerns, contact [facility] at [phone].   YOU WILL BE CONTACTED FOR 2 WEEK FOLLOW UP BY IR SCHEDULER   What is a percutaneous drain?   A drain is a small plastic tube (catheter) that goes into the fluid collection in your body through your skin.   How long will I need the drain?   How long the drain needs to stay in is determined by where the drain is, how much comes out of the drain each day and if you are having any other surgical procedures.   Interventional radiology will determine when it is time to remove the drain. It is important to follow up as directed so that the drain can be removed as soon as it is safe to do so.   What can I expect after the procedure?   After the procedure, it is common to have:   A small amount of bruising and discomfort in the area where the drainage tube (catheter) was placed.   Sleepiness and fatigue. This should go away after the medicines you were given have worn off.   Follow these instructions at home:   Insertion site care   Check your insertion site when you change the bandage. Check for:   More redness, swelling, or pain.   More fluid or blood.   Warmth.   Pus or a bad smell.   When caring for your insertion site:   Wash your hands with soap and water for at least 20 seconds before and after you change your bandage (dressing). If soap and water are not available, use hand sanitizer.   You do not need to change your dressing everyday if it is clean and dry. Change your dressing every 3 days or as needed when it is soiled, wet or becoming dislodged. You will need to change your  dressing each time you shower.   Leave stitches (sutures), skin glue, or adhesive strips in place. These skin closures may need to stay in place for 2 weeks or longer. If adhesive strip edges start to loosen and curl up, you may trim the loose edges. Do not remove adhesive strips completely unless your health care provider tells you to do so.   Catheter care   Flush the catheter once per day with 5 mL of 0.9% normal saline unless you are told otherwise by your healthcare provider. This helps to prevent clogs in the catheter.   To disconnect the drain, turn the clear plastic tube to the left. Attach the saline syringe by placing it on the white end of the drain and turning gently to the right. Once attached gently push the plunger to the 5 mL mark. After you are done flushing, disconnect the syringe by turning to the left and reattach your drainage container   If you have a bulb please be sure the bulb is charged after reconnecting it - to do this pinch the bulb between your thumb and first finger and close the stopper located on the top of the bulb.    Check for fluid leaking from around your catheter (instead of fluid draining through your catheter). This may be a sign that the drain is no longer  working correctly.   Write down the following information every time you empty your bag:   The date and time.   The amount of drainage.   Activity   Rest at home for 1-2 days after your procedure.   For the first 48 hours do not lift anything more than 10 lbs (about a gallon of milk). You may perform moderate activities/exercise. Please avoid strenuous activities during this time.   Avoid any activities which may pull on your drain as this can cause your drain to become dislodged.   If you were given a sedative during the procedure, it can affect you for several hours. Do not drive or operate machinery until your health care provider says that it is safe.   General instructions   For mild  pain take over-the-counter medications as needed for pain such as Tylenol  or Advil . If you are experiencing severe pain please call our office as this may indicate an issue with your drain.    If you were prescribed an antibiotic medicine, take it as told by your health care provider. Do not stop using the antibiotic even if you start to feel better.   You may shower 24 hours after the drain is placed. To do this cover the insertion site with a water tight material such as saran wrap and seal the edges with tape, you may also purchase waterproof dressings at your local drug store. Shower as usual and then remove the water tight dressing and any gauze/tape underneath it once you have exited the shower and dried off. Allow the area to air dry or pat dry with a clean towel. Once the skin is completely dry place a new gauze dressing. It is important to keep the site dry at all times to prevent infection.   Do not submerge the drain - this means you cannot take baths, swim, use a hot tub, etc. until the drain is removed.    Do not use any products that contain nicotine or tobacco, such as cigarettes, e-cigarettes, and chewing tobacco. If you need help quitting, ask your health care provider.   Keep all follow-up visits as told by your health care provider. This is important.   Contact a health care provider if:   You have less than 10 mL of drainage a day for 2-3 days in a row, or as directed by your health care provider.   You have any of these signs of infection:   More redness, swelling, or pain around your incision area.   More fluid or blood coming from your incision area.   Warmth coming from your incision area.   Pus or a bad smell coming from your incision area.   You have fluid leaking from around your catheter (instead of through your catheter).   You are unable to flush the drain.   You have a fever or chills.   You have pain that does not get better with medicine.   You have  not been contacted to schedule a drain follow up appointment within 10 days of discharge from the hospital.   Please call [facility] at [phone] with any questions or concerns.   Get help right away if:   Your catheter comes out.   You suddenly stop having drainage from your catheter.   You suddenly have blood in the fluid that is draining from your catheter.   You become dizzy or you faint.   You develop a rash.   You  have nausea or vomiting.   You have difficulty breathing or you feel short of breath.   You develop chest pain.   You have problems with your speech or vision.   You have trouble balancing or moving your arms or legs.   Summary   It is common to have a small amount of bruising and discomfort in the area where the drainage tube (catheter) was placed. You may also have minor discomfort with movement while the drain is in place.   Flush the drain once per day with 5 mL of 0.9% normal saline (unless you were told otherwise by your healthcare provider).    Record the amount of drainage from the bag every time you empty it.   Change the dressing every 3 days or earlier if soiled/wet. Keep the skin dry under the dressing.   You may shower with the drain in place. Do not submerge the drain (no baths, swimming, hot tubs, etc.).   Contact [facility] at [phone] if you have more redness, swelling, or pain around your incision area or if you have pain that does not get better with medicine.   This information is not intended to replace advice given to you by your health care provider. Make sure you discuss any questions you have with your health care provider.   Document Revised: 09/26/2021 Document Reviewed: 06/18/2019   [employer]vier Patient Education  2023 [employer]         Interventional Radiology Drain Record   Empty your drain at least once per day. You may empty it as often as needed. Use this form to write down the amount  of fluid that has collected in the drainage container. Bring this form with you to your follow-up visits. Please call [facility] at [phone] with any questions or concerns prior to your appointment.   Drain #1 location: ___________________   Date __________ Time __________ Amount __________   Date __________ Time __________ Amount __________   Date __________ Time __________ Amount __________   Date __________ Time __________ Amount __________   Date __________ Time __________ Amount __________   Date __________ Time __________ Amount __________   Date __________ Time __________ Amount __________   Date __________ Time __________ Amount __________   Date __________ Time __________ Amount __________   Date __________ Time __________ Amount __________   Date __________ Time __________ Amount __________   Date __________ Time __________ Amount __________   Date __________ Time __________ Amount __________   Date __________ Time __________ Amount __________

## 2023-11-13 NOTE — Progress Notes (Addendum)
 Referring Physician(s): Marlin Simmonds, [place]  Supervising Physician: Fernando Hoyer  Patient Status:  [facility] - In-pt  Brief Hospital Course:  Joshua Riley is a 88 y.o. male with history of a fib, ESRD, depression, obstructive uropathy-s/p left PCN placement, ulcerative colitis-s/p colectomy 2011 with ileostomy, anemia, DVT. Patient is known to IR due to chronic pelvic fluid collection, which drain was placed 07/13/23 by Dr. Marne Sings.  2/12 - Note from Dr. Marlena Sima documents patietn family report that drain was accidentally removed at PT appt. Asymptomatic at that time.  5/4 - Presented to ED with c/c SOB 5/6 - CRS at [facility] recommended IR drain 5/7 - 10Fr pigtail drain placed into presacral fluid with TG approach by Dr. Mabel Savage [facility] is currently working for patient to follow up with team at [facility] or [facility] for tertiary care of his complex abd issues.   Subjective:  Pt is feeling generally well, sitting up in chair, A&Ox4. Denies any fever, chills. Endorses minimal pain at site of insertion. He is to be discharged today.  Allergies: Cephalosporins, Ciprofloxacin, and Baclofen  Medications: Prior to Admission medications   Medication Sig Start Date End Date Taking? Authorizing Provider  albuterol  (PROVENTIL ) (2.5 MG/3ML) 0.083% nebulizer solution Take 2.5 mg by nebulization every 6 (six) hours as needed for wheezing or shortness of breath.   Yes [provider]  albuterol  (VENTOLIN  HFA) 108 (90 Base) MCG/ACT inhaler Inhale 1-2 puffs into the lungs every 6 (six) hours as needed for wheezing or shortness of breath.   Yes [provider]  budesonide  (PULMICORT ) 0.5 MG/2ML nebulizer solution Take 0.5 mg by nebulization 2 (two) times daily as needed (for respiratory flares).   Yes [provider]  Cyanocobalamin (VITAMIN B-12 PO) Take 1 capsule by mouth in the morning and at bedtime.   Yes [provider]  dorzolamide  (TRUSOPT ) 2 % ophthalmic solution Place  1 drop into the right eye at bedtime. 06/17/23  Yes [provider]  ELIQUIS  2.5 MG TABS tablet Take 1 tablet (2.5 mg total) by mouth 2 (two) times daily. 08/07/22  Yes Sonny Dust, MD  folic acid  (FOLVITE ) 1 MG tablet Take 1 tablet (1 mg total) by mouth daily. 07/21/23  Yes Regalado, Belkys A, MD  gabapentin  (NEURONTIN ) 300 MG capsule Take 300 mg by mouth at bedtime. 09/09/23  Yes [provider]  midodrine  (PROAMATINE ) 10 MG tablet Take 1 tablet (10 mg total) by mouth daily. Patient taking differently: Take 10 mg by mouth 3 (three) times daily. 09/24/23  Yes Swinyer, Leilani Punter, NP  mometasone -formoterol  (DULERA ) 200-5 MCG/ACT AERO Inhale 2 puffs into the lungs 2 (two) times daily. 09/08/23  Yes Doroteo Gasmen, MD  Omega Fatty Acids-Vitamins (OMEGA-3 GUMMIES) CHEW Chew 1 tablet by mouth in the morning and at bedtime.   Yes [provider]  pantoprazole  (PROTONIX ) 40 MG tablet Take 1 tablet (40 mg total) by mouth 2 (two) times daily. Patient taking differently: Take 40 mg by mouth 2 (two) times daily as needed (for heartburn or reflux). 07/21/23  Yes Regalado, Belkys A, MD  ramelteon  (ROZEREM ) 8 MG tablet Take 1 tablet (8 mg total) by mouth at bedtime. Patient taking differently: Take 8 mg by mouth at bedtime as needed for sleep. 10/27/23  Yes Almira Jaeger, MD  sertraline  (ZOLOFT ) 50 MG tablet Take 25 mg by mouth at bedtime.   Yes [provider]  sevelamer  carbonate (RENVELA ) 800 MG tablet Take 1,600 mg by mouth 3 (three) times  daily with meals.   Yes [provider]  timolol  (TIMOPTIC ) 0.5 % ophthalmic solution Place 1 drop into both eyes in the morning and at bedtime.   Yes [provider]  traMADol  (ULTRAM ) 50 MG tablet Take 50 mg by mouth every 8 (eight) hours as needed (for pain).   Yes [provider]  TYLENOL  325 MG tablet Take 325-650 mg by mouth every 6 (six) hours as needed for mild pain (pain score 1-3) or headache.    Yes [provider]     Vital Signs: BP 110/64   Pulse 64   Temp 97.6 F (36.4 C) (Axillary)   Resp 20   Ht 5\' 8"  (1.727 m)   Wt 155 lb 3.3 oz (70.4 kg)   SpO2 92%   BMI 23.60 kg/m   Physical Exam Pulmonary:     Effort: Pulmonary effort is normal.  Abdominal:     Palpations: Abdomen is soft.     Tenderness: There is no abdominal tenderness.  Skin:    General: Skin is warm and dry.     Comments: L TG drain insertion site unremarkable. Hazy output present in correctly charged bulb. Dressing C/D/I. L PCN insertion site unremarkable. RN at bedside states that he arrived with undressed PCN. Agreed with RN that dressing would better serve patient.  TG drain site appropriately tender with palpation.   Neurological:     Mental Status: He is alert.     Imaging: CT GUIDED PERITONEAL/RETROPERITONEAL FLUID DRAIN BY PERC CATH Result Date: 11/11/2023 INDICATION: 88 year old male with a history of presacral fluid collection referred for drainage EXAM: CT-GUIDED ABSCESS DRAIN TECHNIQUE: Multidetector CT imaging of the pelvis was performed following the standard protocol without IV contrast. RADIATION DOSE REDUCTION: This exam was performed according to the departmental dose-optimization program which includes automated exposure control, adjustment of the mA and/or kV according to patient size and/or use of iterative reconstruction technique. MEDICATIONS: The patient is currently admitted to the hospital and receiving intravenous antibiotics. The antibiotics were administered within an appropriate time frame prior to the initiation of the procedure. ANESTHESIA/SEDATION: Moderate (conscious) sedation was employed during this procedure. A total of Versed  1.0 mg and Fentanyl  50 mcg was administered intravenously by the radiology nurse. Total intra-service moderate Sedation Time: 14 minutes. The patient's level of consciousness and vital signs were monitored continuously by radiology nursing  throughout the procedure under my direct supervision. COMPLICATIONS: None PROCEDURE: Informed written consent was obtained from the patient after a thorough discussion of the procedural risks, benefits and alternatives. All questions were addressed. Maximal Sterile Barrier Technique was utilized including caps, mask, sterile gowns, sterile gloves, sterile drape, hand hygiene and skin antiseptic. A timeout was performed prior to the initiation of the procedure. Patient was positioned right decubitus on the CT gantry table. Scout CT acquired for planning purposes. The patient is prepped and draped in the usual sterile fashion. 1% lidocaine  was used for local anesthesia. Using CT guidance, a Yueh needle was advanced into the presacral location from a left transgluteal approach. Once we confirmed the catheter position modified Seldinger technique was used to place a 10 [place] drain. Approximately 20 cc of frankly purulent material aspirated for culture. Drain was attached to bulb suction. Final image was stored. Patient tolerated the procedure well and remained hemodynamically stable throughout. No complications were encountered and no significant blood loss. IMPRESSION: Status post CT-guided left transgluteal drain for presacral abscess. Signed, Marciano Settles. Dalene Duck, ABVM, RPVI V[facility] Inter[facility] Radiology  [facility] Radiology Electronically Signed   By: Myrlene Asper D.O.   On: 11/11/2023 17:10   ECHOCARDIOGRAM COMPLETE Result Date: 11/10/2023    ECHOCARDIOGRAM REPORT   Patient Name:   Shaquan Kable Date of Exam: 11/10/2023 Medical Rec #:  [id number]       Height:       68.0 in Accession #:    [id number]      Weight:       151.7 lb Date of Birth:  1935-08-02       BSA:          1.817 m Patient Age:    87 years        BP:           89/59 mmHg Patient Gender: M               HR:           58 bpm. Exam Location:  Inpatient Procedure: 2D Echo, Color Doppler and Cardiac Doppler (Both Spectral and Color             Flow Doppler were utilized during procedure). Indications:    R07.9* Chest pain, unspecified  History:        Patient has prior history of Echocardiogram examinations, most                 recent 05/27/2022.  Sonographer:    Andrena Bang Referring Phys: CALLIE E GOODRICH IMPRESSIONS  1. Left ventricular ejection fraction, by estimation, is 55 to 60%. The left ventricle has normal function. The left ventricle has no regional wall motion abnormalities. Left ventricular diastolic parameters are consistent with Grade I diastolic dysfunction (impaired relaxation).  2. Right ventricular systolic function is normal. The right ventricular size is moderately enlarged.  3. The mitral valve is normal in structure. No evidence of mitral valve regurgitation. No evidence of mitral stenosis.  4. Tricuspid valve regurgitation is moderate.  5. The aortic valve is calcified. There is mild calcification of the aortic valve. There is mild thickening of the aortic valve. Aortic valve regurgitation is mild. Aortic valve sclerosis is present, with no evidence of aortic valve stenosis. Aortic valve mean gradient measures 4.0 mmHg. Aortic valve Vmax measures 1.46 m/s.  6. Aorta measured 40 mm on CT chest 11/09/2023. Aortic dilatation noted. There is mild dilatation of the ascending aorta, measuring 40 mm.  7. The inferior vena cava is normal in size with greater than 50% respiratory variability, suggesting right atrial pressure of 3 mmHg. FINDINGS  Left Ventricle: Left ventricular ejection fraction, by estimation, is 55 to 60%. The left ventricle has normal function. The left ventricle has no regional wall motion abnormalities. Definity  contrast agent was given IV to delineate the left ventricular  endocardial borders. The left ventricular internal cavity size was normal in size. There is no left ventricular hypertrophy. Left ventricular diastolic parameters are consistent with Grade I diastolic dysfunction (impaired relaxation).  Right Ventricle: The right ventricular size is moderately enlarged. No increase in right ventricular wall thickness. Right ventricular systolic function is normal. Left Atrium: Left atrial size was normal in size. Right Atrium: Right atrial size was normal in size. Pericardium: There is no evidence of pericardial effusion. Mitral Valve: The mitral valve is normal in structure. No evidence of mitral valve regurgitation. No evidence of mitral valve stenosis. MV peak gradient, 5.7 mmHg. The mean mitral valve gradient is 3.0 mmHg. Tricuspid Valve: The tricuspid valve is normal in structure. Tricuspid valve regurgitation  is moderate . No evidence of tricuspid stenosis. Aortic Valve: The aortic valve is calcified. There is mild calcification of the aortic valve. There is mild thickening of the aortic valve. Aortic valve regurgitation is mild. Aortic valve sclerosis is present, with no evidence of aortic valve stenosis. Aortic valve mean gradient measures 4.0 mmHg. Aortic valve peak gradient measures 8.5 mmHg. Aortic valve area, by VTI measures 2.72 cm. Pulmonic Valve: The pulmonic valve was normal in structure. Pulmonic valve regurgitation is trivial. No evidence of pulmonic stenosis. Aorta: Aorta measured 40 mm on CT chest 11/09/2023. Aortic dilatation noted. There is mild dilatation of the ascending aorta, measuring 40 mm. Venous: The inferior vena cava is normal in size with greater than 50% respiratory variability, suggesting right atrial pressure of 3 mmHg. IAS/Shunts: No atrial level shunt detected by color flow Doppler.  LEFT VENTRICLE PLAX 2D LVIDd:         4.60 cm      Diastology LVIDs:         3.00 cm      LV e' medial:    7.29 cm/s LV PW:         1.40 cm      LV E/e' medial:  16.7 LV IVS:        0.70 cm      LV e' lateral:   9.03 cm/s LVOT diam:     2.20 cm      LV E/e' lateral: 13.5 LV SV:         89 LV SV Index:   49 LVOT Area:     3.80 cm  LV Volumes (MOD) LV vol d, MOD A2C: 119.0 ml LV vol d, MOD A4C: 151.0  ml LV vol s, MOD A2C: 38.3 ml LV vol s, MOD A4C: 61.0 ml LV SV MOD A2C:     80.7 ml LV SV MOD A4C:     151.0 ml LV SV MOD BP:      85.0 ml RIGHT VENTRICLE RV S prime:     10.90 cm/s TAPSE (M-mode): 1.9 cm LEFT ATRIUM           Index LA Vol (A2C): 91.9 ml 50.58 ml/m LA Vol (A4C): 85.2 ml 46.89 ml/m  AORTIC VALVE AV Area (Vmax):    2.92 cm AV Area (Vmean):   2.81 cm AV Area (VTI):     2.72 cm AV Vmax:           146.00 cm/s AV Vmean:          91.700 cm/s AV VTI:            0.328 m AV Peak Grad:      8.5 mmHg AV Mean Grad:      4.0 mmHg LVOT Vmax:         112.00 cm/s LVOT Vmean:        67.700 cm/s LVOT VTI:          0.235 m LVOT/AV VTI ratio: 0.72  AORTA Ao Asc diam: 4.60 cm MITRAL VALVE MV Area (PHT): 2.84 cm     SHUNTS MV Area VTI:   1.95 cm     Systemic VTI:  0.24 m MV Peak grad:  5.7 mmHg     Systemic Diam: 2.20 cm MV Mean grad:  3.0 mmHg MV Vmax:       1.19 m/s MV Vmean:      75.2 cm/s MV Decel Time: 267 msec MV E velocity: 122.00 cm/s MV A velocity: 116.00  cm/s MV E/A ratio:  1.05 Dorothye Gathers MD Electronically signed by Dorothye Gathers MD Signature Date/Time: 11/10/2023/1:22:42 PM    Final    US  Abdomen Limited RUQ (LIVER/GB) Result Date: 11/09/2023 CLINICAL DATA:  [id number] Abdominal pain [id number] Cholelithiases [id number] EXAM: ULTRASOUND ABDOMEN LIMITED RIGHT UPPER QUADRANT COMPARISON:  None Available. FINDINGS: Gallbladder: Gallbladder wall thickened. Shadowing stone at the fundus. No pericholecystic fluid. Common bile duct: Diameter: 0.7 cm. Liver: Liver is hyperechoic consistent with fatty infiltration. No focal hepatic lesions identified. No intrahepatic ductal dilatation. Hepatopetal portal vein flow. IMPRESSION: 1. Cholelithiasis. Gallbladder wall thickening. 2. Fatty infiltration of the liver. Electronically Signed   By: Sydell Eva M.D.   On: 11/09/2023 22:15    Labs:  CBC: Recent Labs    11/11/23 0005 11/11/23 0706 11/12/23 0900 11/13/23 0303  WBC 9.7 10.6* 9.5 9.0  HGB 8.4* 9.3* 8.6*  8.8*  HCT 24.9* 28.2* 26.3* 26.6*  PLT 204 204 206 202    COAGS: Recent Labs    07/13/23 0658 07/16/23 0630 11/09/23 1804 11/11/23 0005 11/11/23 0706 11/11/23 2041 11/12/23 0900  INR 1.1  --   --  1.3*  --   --   --   APTT 44*   < > 42*  --  35 35 64*   < > = values in this interval not displayed.    BMP: Recent Labs    11/10/23 1148 11/11/23 0706 11/12/23 0900 11/13/23 0303  NA 131* 133* 128* 131*  K 4.2 4.2 4.7 5.1  CL 93* 92* 93* 94*  CO2 19* 23 19* 26  GLUCOSE 102* 89 153* 109*  BUN 95* 49* 72* 44*  CALCIUM  9.1 9.1 8.7* 8.9  CREATININE 11.83* 7.34* 8.98* 5.62*  GFRNONAA 4* 7* 5* 9*    LIVER FUNCTION TESTS: Recent Labs    07/22/23 0450 07/23/23 0344 09/06/23 1808 09/07/23 0609 09/08/23 1948 11/09/23 1804 11/10/23 1148 11/12/23 0900 11/13/23 0303  BILITOT 0.9  --  0.6 1.0  --  1.1  --   --   --   AST 39  --  10* 16  --  16  --   --   --   ALT 13  --  10 15  --  32  --   --   --   ALKPHOS 116  --  115 96  --  77  --   --   --   PROT 5.9*  --  7.7 7.1  --  6.5  --   --   --   ALBUMIN  3.1*   < > 4.9 3.7   < > 2.6* 2.9* 2.8* 2.7*   < > = values in this interval not displayed.    Assessment and Plan:  Drain Location: L TG Size: Fr size: 10 Fr Date of placement: 11/11/23  Currently to: Drain collection device: suction bulb 24 hour output:  Output by Drain (mL) 11/11/23 0701 - 11/11/23 1900 11/11/23 1901 - 11/12/23 0700 11/12/23 0701 - 11/12/23 1900 11/12/23 1901 - 11/13/23 0700 11/13/23 0701 - 11/13/23 1546  Closed System Drain Inferior;Lateral;Left Back Bulb (JP) 10 Fr.  25 20 15 10   -Labs reviewed, no red flag findings ->10cc output in 24 hr period and has been recurrent need for drain.   Current examination: Flushes reportedly flushes easily with 10cc syringe to instill 5cc NS now. Drain recently flushed just prior to exam by RN, not repeated at this time to aspirate. Drain output is serosanguinous with sediment present  in bulb.  Insertion site  unremarkable. Suture and stat lock in place. Dressed appropriately. Bulb correctly charged.  Plan: Continue TID flushes with 5 cc NS. Record output Q shift. Dressing changes QD or PRN if soiled.  Currently appears to be primarily flush material.  Per ID note, abx extended to 5/28 Call IR APP or on call IR MD if difficulty flushing or sudden change in drain output.  Repeat imaging/possible drain injection once output < 10 mL/QD (excluding flush material). Consideration for drain removal if output is < 10 mL/QD (excluding flush material), pending discussion with the providing surgical service.  Discharge planning: 7-10 day drain follow up orders placed. IR scheduler will contact patient with date/time of appointment. Patient will need to flush drain QD with 5 cc NS, record output QD, dressing changes every 2-3 days or earlier if soiled.   IR will continue to follow - please call with questions or concerns.     Electronically Signed: Terressa Fess, NP 11/13/2023, 3:46 PM   I spent a total of 15 Minutes at the the patient's bedside AND on the patient's hospital floor or unit, greater than 50% of which was counseling/coordinating care for s/p image guided presacral fluid collection drain

## 2023-11-13 NOTE — Progress Notes (Signed)
 Subjective: Still with some pain in his backside.  IR drain in place.  Less drainage from his tract now that drain is in place.  ROS: See above, otherwise other systems negative  Objective: Vital signs in last 24 hours: Temp:  [96.6 F (35.9 C)-97.6 F (36.4 C)] 96.6 F (35.9 C) (05/09 0723) Pulse Rate:  [60-106] 62 (05/09 0723) Resp:  [12-25] 18 (05/09 0723) BP: (77-113)/(41-76) 113/55 (05/09 0723) SpO2:  [98 %-100 %] 99 % (05/09 0723) Weight:  [69.7 kg-70.4 kg] 70.4 kg (05/09 0417) Last BM Date : 11/12/23  Intake/Output from previous day: 05/08 0701 - 05/09 0700 In: 450 [P.O.:440; I.V.:10] Out: 1535 [Drains:35; Stool:1000] Intake/Output this shift: Total I/O In: 120 [P.O.:120] Out: -   PE: Gen: sitting in chair in NAD Lungs: respiratory effort nonlabored Skin: IR drain in place with minimal serous output.  Lab Results:  Recent Labs    11/12/23 0900 11/13/23 0303  WBC 9.5 9.0  HGB 8.6* 8.8*  HCT 26.3* 26.6*  PLT 206 202   BMET Recent Labs    11/12/23 0900 11/13/23 0303  NA 128* 131*  K 4.7 5.1  CL 93* 94*  CO2 19* 26  GLUCOSE 153* 109*  BUN 72* 44*  CREATININE 8.98* 5.62*  CALCIUM  8.7* 8.9   PT/INR Recent Labs    11/11/23 0005  LABPROT 16.3*  INR 1.3*   CMP     Component Value Date/Time   NA 131 (L) 11/13/2023 0303   NA 138 04/10/2023 1541   K 5.1 11/13/2023 0303   CL 94 (L) 11/13/2023 0303   CO2 26 11/13/2023 0303   GLUCOSE 109 (H) 11/13/2023 0303   BUN 44 (H) 11/13/2023 0303   BUN 19 04/10/2023 1541   CREATININE 5.62 (H) 11/13/2023 0303   CALCIUM  8.9 11/13/2023 0303   PROT 6.5 11/09/2023 1804   ALBUMIN  2.7 (L) 11/13/2023 0303   AST 16 11/09/2023 1804   ALT 32 11/09/2023 1804   ALKPHOS 77 11/09/2023 1804   BILITOT 1.1 11/09/2023 1804   GFRNONAA 9 (L) 11/13/2023 0303   Lipase     Component Value Date/Time   LIPASE 49 09/06/2023 1808       Studies/Results: CT GUIDED PERITONEAL/RETROPERITONEAL FLUID DRAIN BY PERC  CATH Result Date: 11/11/2023 INDICATION: 88 year old male with a history of presacral fluid collection referred for drainage EXAM: CT-GUIDED ABSCESS DRAIN TECHNIQUE: Multidetector CT imaging of the pelvis was performed following the standard protocol without IV contrast. RADIATION DOSE REDUCTION: This exam was performed according to the departmental dose-optimization program which includes automated exposure control, adjustment of the mA and/or kV according to patient size and/or use of iterative reconstruction technique. MEDICATIONS: The patient is currently admitted to the hospital and receiving intravenous antibiotics. The antibiotics were administered within an appropriate time frame prior to the initiation of the procedure. ANESTHESIA/SEDATION: Moderate (conscious) sedation was employed during this procedure. A total of Versed  1.0 mg and Fentanyl  50 mcg was administered intravenously by the radiology nurse. Total intra-service moderate Sedation Time: 14 minutes. The patient's level of consciousness and vital signs were monitored continuously by radiology nursing throughout the procedure under my direct supervision. COMPLICATIONS: None PROCEDURE: Informed written consent was obtained from the patient after a thorough discussion of the procedural risks, benefits and alternatives. All questions were addressed. Maximal Sterile Barrier Technique was utilized including caps, mask, sterile gowns, sterile gloves, sterile drape, hand hygiene and skin antiseptic. A timeout was performed prior to the initiation of  the procedure. Patient was positioned right decubitus on the CT gantry table. Scout CT acquired for planning purposes. The patient is prepped and draped in the usual sterile fashion. 1% lidocaine  was used for local anesthesia. Using CT guidance, a Yueh needle was advanced into the presacral location from a left transgluteal approach. Once we confirmed the catheter position modified Seldinger technique was used  to place a 10 [place] drain. Approximately 20 cc of frankly purulent material aspirated for culture. Drain was attached to bulb suction. Final image was stored. Patient tolerated the procedure well and remained hemodynamically stable throughout. No complications were encountered and no significant blood loss. IMPRESSION: Status post CT-guided left transgluteal drain for presacral abscess. Signed, Marciano Settles. Rexine Cater, RPVI [facility] Radiology Electronically Signed   By: Myrlene Asper D.O.   On: 11/11/2023 17:10    Anti-infectives: Anti-infectives (From admission, onward)    Start     Dose/Rate Route Frequency Ordered Stop   11/13/23 1600  ertapenem  (INVANZ ) 1 g in sodium chloride  0.9 % 100 mL IVPB        1 g 200 mL/hr over 30 Minutes Intravenous  Once 11/13/23 0856     11/12/23 1100  vancomycin  (VANCOREADY) IVPB 750 mg/150 mL        750 mg 150 mL/hr over 60 Minutes Intravenous  Once 11/12/23 0840 11/12/23 1307   11/11/23 1200  vancomycin  (VANCOREADY) IVPB 750 mg/150 mL  Status:  Discontinued        750 mg 150 mL/hr over 60 Minutes Intravenous Every M-W-F (Hemodialysis) 11/09/23 2017 11/11/23 1142   11/11/23 1144  vancomycin  variable dose per unstable renal function (pharmacist dosing)         Does not apply See admin instructions 11/11/23 1144     11/10/23 2200  meropenem  (MERREM ) 1 g in sodium chloride  0.9 % 100 mL IVPB  Status:  Discontinued        1 g 200 mL/hr over 30 Minutes Intravenous Every 24 hours 11/09/23 0753 11/13/23 0856   11/10/23 1830  vancomycin  (VANCOREADY) IVPB 750 mg/150 mL        750 mg 150 mL/hr over 60 Minutes Intravenous STAT 11/10/23 1816 11/10/23 2109   11/09/23 0600  meropenem  (MERREM ) 1 g in sodium chloride  0.9 % 100 mL IVPB  Status:  Discontinued        1 g 200 mL/hr over 30 Minutes Intravenous Every 8 hours 11/09/23 0146 11/09/23 0753   11/09/23 0330  vancomycin  (VANCOCIN ) 500 mg in sodium chloride  0.9 %  100 mL IVPB       Placed in "Followed by" Linked Group   500 mg 100 mL/hr over 60 Minutes Intravenous  Once 11/09/23 0220 11/09/23 0525   11/09/23 0230  vancomycin  (VANCOCIN ) IVPB 1000 mg/200 mL premix       Placed in "Followed by" Linked Group   1,000 mg 200 mL/hr over 60 Minutes Intravenous  Once 11/09/23 0220 11/09/23 0341   11/09/23 0200  vancomycin  (VANCOREADY) IVPB 1500 mg/300 mL  Status:  Discontinued        1,500 mg 150 mL/hr over 120 Minutes Intravenous  Once 11/09/23 0146 11/09/23 0220        Assessment/Plan H/O UC, s/p TAC with ileostomy, recurrent presacral fluid collection -IR drain in place, cx with gram - rods.  ID following and will defer abx choice to them. -no role for surgical intervention -he would like to follow up more locally than [facility].  We discussed today that due to his complexity history, he will still require a tertiary care for continued management and follow up for his complex abdominal issues.  He is ok with [facility].  I have asked our office to make a referral for him to see CR at [facility]. -will defer further care to IR for his drain at this time. -we will sign off. -d/w primary service   FEN - regular diet VTE - Eliquis  ID - Invanz /Vanc   Pericarditis ESRD A fib H/O UC Hypotension  I reviewed Consultant ID notes, hospitalist notes, last 24 h vitals and pain scores, last 48 h intake and output, last 24 h labs and trends, and last 24 h imaging results.   LOS: 4 days    Joshua Riley , [facility] 11/13/2023, 10:19 AM Please see Amion for pager number during day hours 7:00am-4:30pm or 7:00am -11:30am on weekends

## 2023-11-13 NOTE — Progress Notes (Signed)
 [facility] Progress Note   Subjective:   feels decent.  Frustrated by wait to secure outpt antibiotics.  Had some post dialysis prolonged bleeding from needle site but was controlled fairly easily  Objective Vitals:   11/12/23 2021 11/13/23 0109 11/13/23 0417 11/13/23 0723  BP: (!) 96/53 101/66 (!) 94/56 (!) 113/55  Pulse: 62 64 60 62  Resp: 17 18 18 18   Temp: (!) 97.5 F (36.4 C) 97.6 F (36.4 C) (!) 97.5 F (36.4 C) (!) 96.6 F (35.9 C)  TempSrc: Oral Oral Oral Axillary  SpO2: 100% 99% 99% 99%  Weight:   70.4 kg   Height:       General: Elderly male; frail; NAD; on RA Head: Sclera not icteric  Lungs: clear, No wheeze or rhonchi. Breathing is unlabored. Heart: RRR. No murmur, rubs or gallops.  Abdomen: soft and non-tender, presacral drain with cloudy drainage in bulb Lower extremities: No LE edema Neuro: AAOx3. Moves all extremities spontaneously. Dialysis Access: L thigh AVG (+) B/T  Additional Objective Labs: Basic Metabolic Panel: Recent Labs  Lab 11/10/23 1148 11/11/23 0706 11/12/23 0900 11/13/23 0303  NA 131* 133* 128* 131*  K 4.2 4.2 4.7 5.1  CL 93* 92* 93* 94*  CO2 19* 23 19* 26  GLUCOSE 102* 89 153* 109*  BUN 95* 49* 72* 44*  CREATININE 11.83* 7.34* 8.98* 5.62*  CALCIUM  9.1 9.1 8.7* 8.9  PHOS 7.1*  --  5.3* 3.8   Liver Function Tests: Recent Labs  Lab 11/09/23 1804 11/10/23 1148 11/12/23 0900 11/13/23 0303  AST 16  --   --   --   ALT 32  --   --   --   ALKPHOS 77  --   --   --   BILITOT 1.1  --   --   --   PROT 6.5  --   --   --   ALBUMIN  2.6* 2.9* 2.8* 2.7*   No results for input(s): "LIPASE", "AMYLASE" in the last 168 hours. CBC: Recent Labs  Lab 11/10/23 0347 11/11/23 0005 11/11/23 0706 11/12/23 0900 11/13/23 0303  WBC 13.2* 9.7 10.6* 9.5 9.0  HGB 9.2* 8.4* 9.3* 8.6* 8.8*  HCT 27.2* 24.9* 28.2* 26.3* 26.6*  MCV 115.3* 115.3* 116.0* 116.9* 116.2*  PLT 226 204 204 206 202   Blood Culture    Component Value  Date/Time   SDES ABDOMEN 11/11/2023 1110   SPECREQUEST NONE 11/11/2023 1110   CULT  11/11/2023 1110    ABUNDANT GRAM NEGATIVE RODS IDENTIFICATION AND SUSCEPTIBILITIES TO FOLLOW CULTURE REINCUBATED FOR BETTER GROWTH Performed at [facility], [address], [place], [place] [address]    REPTSTATUS PENDING 11/11/2023 1110    Cardiac Enzymes: No results for input(s): "CKTOTAL", "CKMB", "CKMBINDEX", "TROPONINI" in the last 168 hours. CBG: No results for input(s): "GLUCAP" in the last 168 hours. Iron Studies: No results for input(s): "IRON", "TIBC", "TRANSFERRIN", "FERRITIN" in the last 72 hours. @lablastinr3 @ Studies/Results: CT GUIDED PERITONEAL/RETROPERITONEAL FLUID DRAIN BY PERC CATH Result Date: 11/11/2023 INDICATION: 88 year old male with a history of presacral fluid collection referred for drainage EXAM: CT-GUIDED ABSCESS DRAIN TECHNIQUE: Multidetector CT imaging of the pelvis was performed following the standard protocol without IV contrast. RADIATION DOSE REDUCTION: This exam was performed according to the departmental dose-optimization program which includes automated exposure control, adjustment of the mA and/or kV according to patient size and/or use of iterative reconstruction technique. MEDICATIONS: The patient is currently admitted to the hospital and receiving intravenous antibiotics. The  antibiotics were administered within an appropriate time frame prior to the initiation of the procedure. ANESTHESIA/SEDATION: Moderate (conscious) sedation was employed during this procedure. A total of Versed  1.0 mg and Fentanyl  50 mcg was administered intravenously by the radiology nurse. Total intra-service moderate Sedation Time: 14 minutes. The patient's level of consciousness and vital signs were monitored continuously by radiology nursing throughout the procedure under my direct supervision. COMPLICATIONS: None PROCEDURE: Informed written consent was obtained from the patient after a  thorough discussion of the procedural risks, benefits and alternatives. All questions were addressed. Maximal Sterile Barrier Technique was utilized including caps, mask, sterile gowns, sterile gloves, sterile drape, hand hygiene and skin antiseptic. A timeout was performed prior to the initiation of the procedure. Patient was positioned right decubitus on the CT gantry table. Scout CT acquired for planning purposes. The patient is prepped and draped in the usual sterile fashion. 1% lidocaine  was used for local anesthesia. Using CT guidance, a Yueh needle was advanced into the presacral location from a left transgluteal approach. Once we confirmed the catheter position modified Seldinger technique was used to place a 10 [place] drain. Approximately 20 cc of frankly purulent material aspirated for culture. Drain was attached to bulb suction. Final image was stored. Patient tolerated the procedure well and remained hemodynamically stable throughout. No complications were encountered and no significant blood loss. IMPRESSION: Status post CT-guided left transgluteal drain for presacral abscess. Signed, Marciano Settles. Rexine Cater, RPVI V[facility] Inter[facility] Radiology Specialists [facility] Radiology Electronically Signed   By: Myrlene Asper D.O.   On: 11/11/2023 17:10   Medications:  meropenem  (MERREM ) IV 1 g (11/12/23 2343)    (feeding supplement) PROSource Plus  30 mL Oral TID BM   apixaban   2.5 mg Oral BID   atorvastatin   20 mg Oral Daily   calcitRIOL   0.5 mcg Oral Q T,Th,Sa-HD   Chlorhexidine  Gluconate Cloth  6 each Topical Q0600   darbepoetin (ARANESP ) injection - DIALYSIS  60 mcg Subcutaneous Q Tue-1800   dorzolamide   1 drop Right Eye QHS   folic acid   1 mg Oral Daily   ibuprofen   800 mg Oral TID   midodrine   15 mg Oral TID   pantoprazole   40 mg Oral BID   sevelamer  carbonate  1.6 g Oral TID WC   sodium chloride  flush  5 mL Intracatheter Q8H   thiamine  (VITAMIN B1) injection  100 mg  Intravenous Daily   timolol   1 drop Both Eyes BID   vancomycin  variable dose per unstable renal function (pharmacist dosing)   Does not apply See admin instructions    Dialysis Orders:  [facility]-MWF 2:45hrs EDW 71kg BFR 350, DFR Auto 1.5 2K/2.5Ca Heparin  No bolus ordered Mircera 120 mcg q4wks (new order). 50mcg last given on 10/26/23 Calcitriol  0.5mcg PO qHD - last dose 11/06/23 Midodrine  15mg  TID Renvela  Powder 1600mg  TID with meals   Last Labs: Hgb 9.2,  K 4.2, Ca 9.1, P 7.1,  Alb 2.9   Assessment/Plan: Chest pain - Cardiology following; being treated for pericarditis with short course NSAIDs Recurrent presacral fluid collection - General surgery consulted - IR 5/7 drain placement.  On vanc and meropenem  currently.  D/w ID - plan is course of abx through 11/24/23.  Based on prior cultures will need ertapenem  1g post HD three times per week - HD unit applied for nonforumlary med - approved, need to await delivery date prior to d/c.  Will need vanc as well.  ESRD -  on HD MWF. Last HD 5/2. Plan for HD today off schedule - will resume outpt schedule at some point prior to d/c, likely TTS this week then MWF next week - ID aware off schedule. Appears he's been leaving under EDW.  Chronic hypotension - continue Midodrine  15mg  TID Volume  - He's frail and appears he's been leaving under EDW at outpatient dialysis. Monitor closely. Will adjust EDW at discharge for accuracy Anemia of CKD - Hgb 8-9s, resumed ESA here Secondary Hyperparathyroidism -  Ca okay but phos is high. Resumed binders and VDRA Nutrition - Renal diet with fluid restriction. Protein supplements for low Alb Ok for d/c as soon as we have outpt ertapenem  available.  Adrian Alba MD 11/13/2023, 8:36 AM  [facility] Pager: [phone]

## 2023-11-13 NOTE — Progress Notes (Signed)
 Mobility Specialist: Progress Note   11/13/23 0938  Mobility  Activity Ambulated with assistance in hallway  Level of Assistance Contact guard assist, steadying assist  Assistive Device Front wheel walker  Distance Ambulated (ft) 200 ft  Activity Response Tolerated well  Mobility Referral Yes  Mobility visit 1 Mobility  Mobility Specialist Start Time (ACUTE ONLY) 0913  Mobility Specialist Stop Time (ACUTE ONLY) [id number]  Mobility Specialist Time Calculation (min) (ACUTE ONLY) 10 min    Pt was agreeable to mobility session - received on EOB. CG throughout. C/o neuropathy causing bil foot numbness and tingling in the bottom half of his BLEs. Also c/o BLE weakness. Returned to room without fault. Left in chair with all needs met, call bell in reach.   Deloria Fetch Mobility Specialist Please contact via SecureChat or Rehab office at [phone]

## 2023-11-13 NOTE — Progress Notes (Addendum)
 Contacted team this morning to be advised that clinic has ordered ertapenem  as urgent delivery and abx should be available for pt to receive on Monday's treatment should pt be stable for d/c. Will assist as needed.   Lauraine Polite Renal Navigator [phone]  Addendum at 1:40 pm: Clinic advised that ID MD needs to extend iv abx until 5/28. Clinic staff advised that renal staff will fax orders to clinic at d/c. Clinic staff aware that pt is likely for d/c today/this weekend and should resume on Monday if that occurs.   Addendum at 4:30 pm: Advised by RN CM that daughter has questions regarding iv abx with HD. Spoke to daughter via phone. Daughter inquiring if all arrangements have been made for iv abx with HD. Daughter advised that clinic is aware of pt's iv abx needs and medication has been ordered. Daughter also advised that per clinic staff, medication should be available for pt's treatment on Monday. Daughter agreeable to plan. Contacted clinic staff to be advised of pt's d/c today.

## 2023-11-13 NOTE — Discharge Summary (Signed)
 Physician Discharge Summary  Joshua Riley ZOX:[id number] DOB: 30-Jun-1936 DOA: 11/08/2023  PCP: Joshua Jaeger, MD  Admit date: 11/08/2023 Discharge date: 11/13/2023  Time spent: 60 minutes  Recommendations for Outpatient Follow-up:  Follow-up at hemodialysis center on Monday, 11/16/2023.  Patient will receive IV antibiotics at hemodialysis center vancomycin  and ertapenem  through 12/02/2023. Follow-up with Joshua Jaeger, MD Follow-up with primary general surgeons at [facility] in 3 to 4 weeks. Follow-up with Dr. Ernie Heal, ID on 12/02/2023.   Discharge Diagnoses:  Principal Problem:   Chest pain Active Problems:   Acute pericarditis   End-stage renal disease on hemodialysis (HCC)   Atrial fibrillation, persistent (HCC)   Ulcerative colitis (HCC)   Orthostatic hypotension   Nephrostomy present (HCC)   DOE (dyspnea on exertion)   History of anemia due to chronic kidney disease   Sacral osteomyelitis (HCC)   Discharge Condition: Stable and improved.  Diet recommendation: Regular  Filed Weights   11/12/23 0900 11/12/23 1245 11/13/23 0417  Weight: 70.2 kg 69.7 kg 70.4 kg    History of present illness:  HPI per Joshua Riley Joshua Riley is a 88 y.o. 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  Chest heaviness and chest pains started about an hour prior to his arrival at the facility and describes it as bilateral mid chest across the chest associated with shortness of breath no radiation.  Patient is compliant with  dialysis and last dialysis session was not Friday.pt drink one or two drinks a week and no h/o of withdrawal.Pt also describes abd pain and tenderness on exam.    ED Course: Pt at bedside  is no distress alert awake oriented. Vital signs in the ED were notable for the following:  Multiple Vitals        Vitals:    11/09/23 1457 11/09/23 1700 11/09/23 1942 11/09/23 2005  BP: 121/69   (!) 97/44 103/60  Pulse: 85   80    Temp: 98.7 F (37.1 C)   98.1 F (36.7 C)    Resp: 20   18 18   Height:   5\' 8"  (1.727 m)      Weight:   68.8 kg      SpO2: 91%   93% 93%  TempSrc: Oral   Oral    BMI (Calculated):   23.07          >>ED evaluation thus far shows: - BMP is abnormal with BUN of 67 creatinine of 10.10 anion gap of 20 GFR of 5. -Troponin of 428 and repeat troponin of 374. EKG shows A-fib at 93 with a right bundle branch block and a prolonged QTc of 522, PVCs.patient also had elevated troponins and cardiology was consulted with recommendations for heparin  gtt. - CBC shows white count of 15.8 hemoglobin of 9.8 MCV of 116.5 and platelets of 197. -  2D Echo 05/2022 shows : 1. Left ventricular ejection fraction, by estimation, is 60 to 65%. The  left ventricle has normal function. The left ventricle has no regional  wall motion abnormalities. There is mild left ventricular hypertrophy.  Left ventricular diastolic  parameters  are consistent with Grade I diastolic dysfunction (impaired relaxation).  Elevated left atrial pressure. The average left ventricular global  longitudinal strain is -19.4 %. The global longitudinal strain is normal.   2. Right ventricular systolic function is normal. The right ventricular  size is normal. There is normal pulmonary artery systolic pressure.   3. The mitral valve is normal in structure. Trivial mitral valve  regurgitation. No evidence of mitral stenosis.   4. The aortic valve is normal in structure. Aortic valve regurgitation is  mild. No aortic stenosis  is present.   5. Aortic dilatation noted. There is borderline dilatation of the aortic  root, measuring 38 mm. There is mild dilatation of the ascending aorta,  measuring 42 mm.   6. The inferior vena cava is normal in size with greater than 50%  respiratory variability, suggesting right atrial pressure of 3 mmHg. -CTA dissection protocol was negative for aortic dissection but did show concerning findings of sacral osteomyelitis. >>While in the ED patient received the following: Medications  aspirin  tablet 325 mg (325 mg Oral Given 11/09/23 0937)  nitroGLYCERIN  (NITROSTAT ) SL tablet 0.4 mg (has no administration in time range)  heparin  ADULT infusion 100 units/mL (25000 units/250mL) (850 Units/hr Intravenous New Bag/Given 11/09/23 0937)  midodrine  (PROAMATINE ) tablet 15 mg (15 mg Oral Given 11/09/23 1149)  meropenem  (MERREM ) 1 g in sodium chloride  0.9 % 100 mL IVPB (has no administration in time range)  fentaNYL  (SUBLIMAZE ) injection 50 mcg (50 mcg Intravenous Given 11/09/23 0006)  iohexol  (OMNIPAQUE ) 350 MG/ML injection 100 mL (100 mLs Intravenous Contrast Given 11/09/23 0020)  fentaNYL  (SUBLIMAZE ) injection 50 mcg (50 mcg Intravenous Given 11/09/23 0054)  vancomycin  (VANCOCIN ) IVPB 1000 mg/200 mL premix (0 mg Intravenous Stopped 11/09/23 0341)    Followed by  vancomycin  (VANCOCIN ) 500 mg in sodium chloride  0.9 % 100 mL IVPB (0 mg Intravenous Stopped 11/09/23 0525)  HYDROmorphone  (DILAUDID ) injection 0.5 mg (0.5 mg Intravenous Given 11/09/23 0230)  ondansetron  (ZOFRAN ) injection 4 mg (4 mg Intravenous Given 11/09/23 1405)  fentaNYL  (SUBLIMAZE ) injection 50 mcg (50 mcg Intravenous Given 11/09/23 1405)    Hospital Course:  #1 chest pain, concern for acute pericarditis -Patient noted to have presented with chest pain with elevated troponins. - 2D echo obtained with EF of 55 to 60%,NWMA, grade 1 diastolic dysfunction, aortic valve sclerosis with no evidence of aortic valve stenosis.  Aorta measured 40  mm. -Patient seen in consultation by cardiology and placed on ibuprofen  to treat pericarditis. -Cardiology recommended a short course of 10 days of treatment. -Patient maintained on PPI for GI prophylaxis while on NSAIDs. -Patient was discharged home on 7 more days of ibuprofen  with outpatient follow-up with PCP.   2.  Presacral fluid collection, sacral osteomyelitis -Patient with prior history of presacral fluid collection noted to have had multiple drains placed in the past. - It is noted in the past that patient was not a candidate for surgery. - Patient noted to have completed 6 weeks of antibiotics in January 2025. - CT done with worsening fluid collection concern for osteomyelitis. - Blood cultures with no growth to date. - Patient seen in consultation by general surgery who spoke with Dr. Genita Keys, colorectal surgeon at [facility] who has been following the patient and it is noted that no surgical options for this problem at this time per general surgery. - General Surgery recommended drain placement by IR which was done, 11/11/2023, with cultures growing abundant E. coli, abundant Enterococcus faecalis.  -Patient maintained  on IV vancomycin  and IV Merrem  during the hospitalization. - ID consulted for antibiotic duration and recommendations and recommended continuation of IV vancomycin  IV Merrem  while in-house and on discharge will need IV vancomycin  IV and IV ertapenem  which would be done during hemodialysis with end date of 12/02/2023. -Outpatient follow-up with primary general surgeon and ID. -Patient will also follow-up with IR in the drain clinic.   3.  Chronic orthostatic hypotension -Status post IV albumin  during the hospitalization with systolic blood pressures in the 80s. -BP improved. -Cortisol level noted at 12.8. - Patient on IV antibiotics as noted in problem #2.   - Patient's home regimen midodrine  was increased to 15 mg 3 times daily which patient be discharged home on.   -  Outpatient follow-up.    4.  Chronic dyspnea -Chronic problem ongoing off and on.  - No significant change with chronic dyspnea during the hospitalization.  - Some concern for deconditioning chronic hypotension. -2D echo obtained with EF of 55 to 60%,NWMA, grade 1 diastolic dysfunction, aortic valve sclerosis with no evidence of aortic valve stenosis.  Aorta measured 40 mm.   5.  ESRD on HD Monday Wednesday Friday - Patient's home regimen midodrine  was increased to 15 mg 3 times daily with HD.   - Patient was followed by nephrology during the hospitalization and underwent hemodialysis while hospitalized.   - Outpatient follow-up at regular hemodialysis center on Monday, 11/16/2023.   6.  Anemia of chronic kidney disease - Remained stable during the hospitalization.    7.  History of A-fib - Rate controlled during the hospitalization.   - Was on IV heparin  post drain placement and subsequently transition back to home regimen Eliquis .    8.  History of ulcerative colitis/status post colectomy 2011 with ileostomy, ileal pouch anal anastomosis surgery 2012/history of SBO - Remained stable during the hospitalization.   - Patient noted with good ostomy output during the hospitalization.  -C. difficile PCR negative.   - Patient was placed on Imodium  as needed.    9.  History of obstructive uropathy with chronic left PCN in place -Outpatient follow-up with urology as previously scheduled.   10.  Abdominal pain -Patient had some complaints of abdominal pain differential of cholelithiasis versus a vascular etiology. - Noted with severe narrowing of the celiac origin secondary to atherosclerotic calcified plaque along with a calcified plaque at the SMA without hemodynamically significant stenosis. - Patient with surgical grafts in the left common femoral artery and vein in the subcutaneous ventral left thigh. - Right upper quadrant ultrasound with cholelithiasis, gallbladder wall thickening,  fatty infiltration of the liver. - C. difficile PCR negative. - Outpatient follow-up.   11.  Irregular asymmetric left bladder wall thickening similar to prior -Outpatient follow-up with urology.    Procedures: CT angiogram chest abdomen and pelvis 11/09/2023 Chest x-ray 11/08/2023 Right upper quadrant ultrasound 11/09/2023 2D echo 11/10/2023 Image guided left transgluteal drain placement, presacral 10 French pigtail drain per IR: Dr. Mabel Savage 11/11/2023  Consultations: Interventional radiology: Dr. Julietta Ogren 11/10/2023 General Surgery: Dr. Aniceto Barley 11/10/2023 Cardiology: Dr. Lavonne Prairie 11/09/2023 Wound care RN ID: Dr. Zelda Hickman 11/11/2023  Discharge Exam: Vitals:   11/13/23 1241 11/13/23 1535  BP: (!) 83/49 110/64  Pulse: 64   Resp: 20   Temp: 97.6 F (36.4 C)   SpO2: 92%     General: NAD Cardiovascular: RRR no murmurs rubs or gallops.  No JVD.  No lower extremity edema. Respiratory: Clear to auscultation bilaterally anterior lung fields.  No  wheezes, no crackles, no rhonchi.  Discharge Instructions   Discharge Instructions     Diet general   Complete by: As directed    Home infusion instructions   Complete by: As directed    Instructions: Flushing of vascular access device: 0.9% NaCl pre/post medication administration and prn patency; Heparin  100 u/ml, 5ml for implanted ports and Heparin  10u/ml, 5ml for all other central venous catheters.   Increase activity slowly   Complete by: As directed    No wound care   Complete by: As directed       Allergies as of 11/13/2023       Reactions   Cephalosporins Rash   Ciprofloxacin Itching, Rash   Baclofen Other (See Comments)   Altered mental status, after accidental overdose        Medication List     TAKE these medications    albuterol  108 (90 Base) MCG/ACT inhaler Commonly known as: VENTOLIN  HFA Inhale 1-2 puffs into the lungs every 6 (six) hours as needed for wheezing or shortness of breath.   albuterol  (2.5 MG/3ML) 0.083% nebulizer  solution Commonly known as: PROVENTIL  Take 2.5 mg by nebulization every 6 (six) hours as needed for wheezing or shortness of breath.   atorvastatin  20 MG tablet Commonly known as: LIPITOR Take 1 tablet (20 mg total) by mouth daily. Start taking on: Nov 14, 2023   budesonide  0.5 MG/2ML nebulizer solution Commonly known as: PULMICORT  Take 0.5 mg by nebulization 2 (two) times daily as needed (for respiratory flares).   dorzolamide  2 % ophthalmic solution Commonly known as: TRUSOPT  Place 1 drop into the right eye at bedtime.   Eliquis  2.5 MG Tabs tablet Generic drug: apixaban  Take 1 tablet (2.5 mg total) by mouth 2 (two) times daily.   ertapenem  1 g in sodium chloride  0.9 % 100 mL Inject 1 g into the vein daily for 19 days.   ertapenem  IVPB Commonly known as: INVANZ  Inject 1 g into the vein every Monday, Wednesday, and Friday with hemodialysis for 19 days. **To be given at dialysis** Indication: Sacral osteomyelitis  First Dose: Yes  Last Day of Therapy:  5/28 Labs - Once weekly:  CBC/D and BMP, Labs - Once weekly: ESR and CRP Method of administration: Mini-Bag Plus / Gravity Method of administration may be changed at the discretion of home infusion pharmacist based upon assessment of the patient and/or caregiver's ability to self-administer the medication ordered.   folic acid  1 MG tablet Commonly known as: FOLVITE  Take 1 tablet (1 mg total) by mouth daily.   gabapentin  300 MG capsule Commonly known as: NEURONTIN  Take 300 mg by mouth at bedtime.   ibuprofen  800 MG tablet Commonly known as: ADVIL  Take 1 tablet (800 mg total) by mouth 3 (three) times daily for 7 days.   loperamide  2 MG capsule Commonly known as: IMODIUM  Take 1 capsule (2 mg total) by mouth as needed for diarrhea or loose stools.   midodrine  5 MG tablet Commonly known as: PROAMATINE  Take 3 tablets (15 mg total) by mouth 3 (three) times daily. What changed:  medication strength how much to take when  to take this   mometasone -formoterol  200-5 MCG/ACT Aero Commonly known as: DULERA  Inhale 2 puffs into the lungs 2 (two) times daily.   nitroGLYCERIN  0.4 MG SL tablet Commonly known as: NITROSTAT  Place 1 tablet (0.4 mg total) under the tongue every 5 (five) minutes as needed for chest pain.   Omega-3 Gummies Chew Chew 1 tablet by mouth in  the morning and at bedtime.   pantoprazole  40 MG tablet Commonly known as: PROTONIX  Take 1 tablet (40 mg total) by mouth 2 (two) times daily. What changed:  when to take this reasons to take this   ramelteon  8 MG tablet Commonly known as: ROZEREM  Take 1 tablet (8 mg total) by mouth at bedtime. What changed:  when to take this reasons to take this   Saline Flush 0.9 % Soln Use 5 mLs by Intracatheter route daily as directed.   sertraline  50 MG tablet Commonly known as: ZOLOFT  Take 25 mg by mouth at bedtime.   sevelamer  carbonate 800 MG tablet Commonly known as: RENVELA  Take 1,600 mg by mouth 3 (three) times daily with meals.   timolol  0.5 % ophthalmic solution Commonly known as: TIMOPTIC  Place 1 drop into both eyes in the morning and at bedtime.   traMADol  50 MG tablet Commonly known as: ULTRAM  Take 1 tablet (50 mg total) by mouth every 8 (eight) hours as needed (for pain).   Tylenol  325 MG tablet Generic drug: acetaminophen  Take 325-650 mg by mouth every 6 (six) hours as needed for mild pain (pain score 1-3) or headache.   vancomycin  IVPB Inject 750 mg into the vein every Monday, Wednesday, and Friday with hemodialysis for 19 days. **To be given at dialysis** Indication: Sacral osteomyelitis  First Dose: Yes  Last Day of Therapy:  5/28 Labs - Once weekly:  CBC/D and BMP, Labs - Once weekly: ESR and CRP Method of administration: Mini-Bag Plus / Gravity Method of administration may be changed at the discretion of home infusion pharmacist based upon assessment of the patient and/or caregiver's ability to self-administer the  medication ordered.   VITAMIN B-12 PO Take 1 capsule by mouth in the morning and at bedtime.               Home Infusion Instuctions  (From admission, onward)           Start     Ordered   11/13/23 0000  Home infusion instructions       Question:  Instructions  Answer:  Flushing of vascular access device: 0.9% NaCl pre/post medication administration and prn patency; Heparin  100 u/ml, 5ml for implanted ports and Heparin  10u/ml, 5ml for all other central venous catheters.   11/13/23 1615           Allergies  Allergen Reactions   Cephalosporins Rash   Ciprofloxacin Itching and Rash   Baclofen Other (See Comments)    Altered mental status, after accidental overdose      Follow-up Information     Joshua Jaeger, MD Follow up.   Specialty: Family Medicine Contact information: [address] [place] [address] [phone]         HD unit Follow up on 11/16/2023.   Why: Follow-up as scheduled at hemodialysis unit on 11/16/2023.        Ernie Heal, Jerelyn Money, MD Follow up on 12/02/2023.   Specialty: Infectious Diseases Why: Follow-up at 9:15 AM Contact information: [address] [place] [address] [phone]         [facility]. Schedule an appointment as soon as possible for a visit in 3 week(s).   Why: Follow-up with [facility] in 3 to 4 weeks.                 The results of significant diagnostics from this hospitalization (including imaging, microbiology, ancillary and laboratory) are listed below for reference.  Significant Diagnostic Studies: CT GUIDED PERITONEAL/RETROPERITONEAL FLUID DRAIN BY PERC CATH Result Date: 11/11/2023 INDICATION: 88 year old male with a history of presacral fluid collection referred for drainage EXAM: CT-GUIDED ABSCESS DRAIN TECHNIQUE: Multidetector CT imaging of the pelvis was performed following the standard protocol without IV contrast. RADIATION DOSE REDUCTION: This exam was  performed according to the departmental dose-optimization program which includes automated exposure control, adjustment of the mA and/or kV according to patient size and/or use of iterative reconstruction technique. MEDICATIONS: The patient is currently admitted to the hospital and receiving intravenous antibiotics. The antibiotics were administered within an appropriate time frame prior to the initiation of the procedure. ANESTHESIA/SEDATION: Moderate (conscious) sedation was employed during this procedure. A total of Versed  1.0 mg and Fentanyl  50 mcg was administered intravenously by the radiology nurse. Total intra-service moderate Sedation Time: 14 minutes. The patient's level of consciousness and vital signs were monitored continuously by radiology nursing throughout the procedure under my direct supervision. COMPLICATIONS: None PROCEDURE: Informed written consent was obtained from the patient after a thorough discussion of the procedural risks, benefits and alternatives. All questions were addressed. Maximal Sterile Barrier Technique was utilized including caps, mask, sterile gowns, sterile gloves, sterile drape, hand hygiene and skin antiseptic. A timeout was performed prior to the initiation of the procedure. Patient was positioned right decubitus on the CT gantry table. Scout CT acquired for planning purposes. The patient is prepped and draped in the usual sterile fashion. 1% lidocaine  was used for local anesthesia. Using CT guidance, a Yueh needle was advanced into the presacral location from a left transgluteal approach. Once we confirmed the catheter position modified Seldinger technique was used to place a 10 [place] drain. Approximately 20 cc of frankly purulent material aspirated for culture. Drain was attached to bulb suction. Final image was stored. Patient tolerated the procedure well and remained hemodynamically stable throughout. No complications were encountered and no significant blood loss.  IMPRESSION: Status post CT-guided left transgluteal drain for presacral abscess. Signed, Marciano Settles. Rexine Cater, RPVI [facility] Radiology Electronically Signed   By: Myrlene Asper D.O.   On: 11/11/2023 17:10   ECHOCARDIOGRAM COMPLETE Result Date: 11/10/2023    ECHOCARDIOGRAM REPORT   Patient Name:   Ashely Persson Date of Exam: 11/10/2023 Medical Rec #:  [id number]       Height:       68.0 in Accession #:    [id number]      Weight:       151.7 lb Date of Birth:  09-06-1935       BSA:          1.817 m Patient Age:    87 years        BP:           89/59 mmHg Patient Gender: M               HR:           58 bpm. Exam Location:  Inpatient Procedure: 2D Echo, Color Doppler and Cardiac Doppler (Both Spectral and Color            Flow Doppler were utilized during procedure). Indications:    R07.9* Chest pain, unspecified  History:        Patient has prior history of Echocardiogram examinations, most                 recent 05/27/2022.  Sonographer:    Andrena Bang Referring Phys: CALLIE E GOODRICH  IMPRESSIONS  1. Left ventricular ejection fraction, by estimation, is 55 to 60%. The left ventricle has normal function. The left ventricle has no regional wall motion abnormalities. Left ventricular diastolic parameters are consistent with Grade I diastolic dysfunction (impaired relaxation).  2. Right ventricular systolic function is normal. The right ventricular size is moderately enlarged.  3. The mitral valve is normal in structure. No evidence of mitral valve regurgitation. No evidence of mitral stenosis.  4. Tricuspid valve regurgitation is moderate.  5. The aortic valve is calcified. There is mild calcification of the aortic valve. There is mild thickening of the aortic valve. Aortic valve regurgitation is mild. Aortic valve sclerosis is present, with no evidence of aortic valve stenosis. Aortic valve mean gradient measures 4.0 mmHg. Aortic valve Vmax measures 1.46 m/s.   6. Aorta measured 40 mm on CT chest 11/09/2023. Aortic dilatation noted. There is mild dilatation of the ascending aorta, measuring 40 mm.  7. The inferior vena cava is normal in size with greater than 50% respiratory variability, suggesting right atrial pressure of 3 mmHg. FINDINGS  Left Ventricle: Left ventricular ejection fraction, by estimation, is 55 to 60%. The left ventricle has normal function. The left ventricle has no regional wall motion abnormalities. Definity  contrast agent was given IV to delineate the left ventricular  endocardial borders. The left ventricular internal cavity size was normal in size. There is no left ventricular hypertrophy. Left ventricular diastolic parameters are consistent with Grade I diastolic dysfunction (impaired relaxation). Right Ventricle: The right ventricular size is moderately enlarged. No increase in right ventricular wall thickness. Right ventricular systolic function is normal. Left Atrium: Left atrial size was normal in size. Right Atrium: Right atrial size was normal in size. Pericardium: There is no evidence of pericardial effusion. Mitral Valve: The mitral valve is normal in structure. No evidence of mitral valve regurgitation. No evidence of mitral valve stenosis. MV peak gradient, 5.7 mmHg. The mean mitral valve gradient is 3.0 mmHg. Tricuspid Valve: The tricuspid valve is normal in structure. Tricuspid valve regurgitation is moderate . No evidence of tricuspid stenosis. Aortic Valve: The aortic valve is calcified. There is mild calcification of the aortic valve. There is mild thickening of the aortic valve. Aortic valve regurgitation is mild. Aortic valve sclerosis is present, with no evidence of aortic valve stenosis. Aortic valve mean gradient measures 4.0 mmHg. Aortic valve peak gradient measures 8.5 mmHg. Aortic valve area, by VTI measures 2.72 cm. Pulmonic Valve: The pulmonic valve was normal in structure. Pulmonic valve regurgitation is trivial. No  evidence of pulmonic stenosis. Aorta: Aorta measured 40 mm on CT chest 11/09/2023. Aortic dilatation noted. There is mild dilatation of the ascending aorta, measuring 40 mm. Venous: The inferior vena cava is normal in size with greater than 50% respiratory variability, suggesting right atrial pressure of 3 mmHg. IAS/Shunts: No atrial level shunt detected by color flow Doppler.  LEFT VENTRICLE PLAX 2D LVIDd:         4.60 cm      Diastology LVIDs:         3.00 cm      LV e' medial:    7.29 cm/s LV PW:         1.40 cm      LV E/e' medial:  16.7 LV IVS:        0.70 cm      LV e' lateral:   9.03 cm/s LVOT diam:     2.20 cm      LV  E/e' lateral: 13.5 LV SV:         89 LV SV Index:   49 LVOT Area:     3.80 cm  LV Volumes (MOD) LV vol d, MOD A2C: 119.0 ml LV vol d, MOD A4C: 151.0 ml LV vol s, MOD A2C: 38.3 ml LV vol s, MOD A4C: 61.0 ml LV SV MOD A2C:     80.7 ml LV SV MOD A4C:     151.0 ml LV SV MOD BP:      85.0 ml RIGHT VENTRICLE RV S prime:     10.90 cm/s TAPSE (M-mode): 1.9 cm LEFT ATRIUM           Index LA Vol (A2C): 91.9 ml 50.58 ml/m LA Vol (A4C): 85.2 ml 46.89 ml/m  AORTIC VALVE AV Area (Vmax):    2.92 cm AV Area (Vmean):   2.81 cm AV Area (VTI):     2.72 cm AV Vmax:           146.00 cm/s AV Vmean:          91.700 cm/s AV VTI:            0.328 m AV Peak Grad:      8.5 mmHg AV Mean Grad:      4.0 mmHg LVOT Vmax:         112.00 cm/s LVOT Vmean:        67.700 cm/s LVOT VTI:          0.235 m LVOT/AV VTI ratio: 0.72  AORTA Ao Asc diam: 4.60 cm MITRAL VALVE MV Area (PHT): 2.84 cm     SHUNTS MV Area VTI:   1.95 cm     Systemic VTI:  0.24 m MV Peak grad:  5.7 mmHg     Systemic Diam: 2.20 cm MV Mean grad:  3.0 mmHg MV Vmax:       1.19 m/s MV Vmean:      75.2 cm/s MV Decel Time: 267 msec MV E velocity: 122.00 cm/s MV A velocity: 116.00 cm/s MV E/A ratio:  1.05 Dorothye Gathers MD Electronically signed by Dorothye Gathers MD Signature Date/Time: 11/10/2023/1:22:42 PM    Final    US  Abdomen Limited RUQ (LIVER/GB) Result Date:  11/09/2023 CLINICAL DATA:  [id number] Abdominal pain [id number] Cholelithiases 92642 EXAM: ULTRASOUND ABDOMEN LIMITED RIGHT UPPER QUADRANT COMPARISON:  None Available. FINDINGS: Gallbladder: Gallbladder wall thickened. Shadowing stone at the fundus. No pericholecystic fluid. Common bile duct: Diameter: 0.7 cm. Liver: Liver is hyperechoic consistent with fatty infiltration. No focal hepatic lesions identified. No intrahepatic ductal dilatation. Hepatopetal portal vein flow. IMPRESSION: 1. Cholelithiasis. Gallbladder wall thickening. 2. Fatty infiltration of the liver. Electronically Signed   By: Sydell Eva M.D.   On: 11/09/2023 22:15   CT Angio Chest/Abd/Pel for Dissection W and/or Wo Contrast Result Date: 11/09/2023 CLINICAL DATA:  Acute aortic syndrome suspected. Chest pain and shortness of breath. EXAM: CT ANGIOGRAPHY CHEST, ABDOMEN AND PELVIS TECHNIQUE: Non-contrast CT of the chest was initially obtained. Multidetector CT imaging through the chest, abdomen and pelvis was performed using the standard protocol during bolus administration of intravenous contrast. Multiplanar reconstructed images and MIPs were obtained and reviewed to evaluate the vascular anatomy. RADIATION DOSE REDUCTION: This exam was performed according to the departmental dose-optimization program which includes automated exposure control, adjustment of the mA and/or kV according to patient size and/or use of iterative reconstruction technique. CONTRAST:  OMNIPAQUE  IOHEXOL  350 MG/ML SOLN COMPARISON:  Chest radiograph 11/08/2023; CT abdomen pelvis  09/06/2023; CT chest 08/18/2023 FINDINGS: CTA CHEST FINDINGS Cardiovascular: No intramural hematoma, penetrating atherosclerotic ulcer, or dissection. Normal caliber thoracic aorta. Aortic and coronary artery atherosclerotic calcification. Right subclavian vein stent. Mediastinum/Nodes: Trachea is patent. Unremarkable thyroid and esophagus. No lymphadenopathy. Lungs/Pleura: Lower lobe  atelectasis and scarring. Otherwise no focal consolidation. No pleural effusion or pneumothorax. 7 mm right lower lobe nodule on series 7/image 103 is unchanged. Musculoskeletal: No acute fracture. Review of the MIP images confirms the above findings. CTA ABDOMEN AND PELVIS FINDINGS VASCULAR Aorta: Calcified atherosclerotic plaque throughout the abdominal aorta without hemodynamically significant stenosis. No aneurysm or dissection. Celiac: Severe narrowing at the origin secondary to calcified atherosclerotic plaque. No aneurysm or dissection. SMA: Calcified plaque without hemodynamically significant stenosis. No aneurysm or dissection. Renals: Calcified plaque at the origin of the left renal artery. No hemodynamically significant stenosis. No aneurysm or dissection. IMA: Not visualized and likely occluded at the origin. Inflow: Redemonstrated surgical grafts arising from the left common femoral artery and vein extending inferiorly in the subcutaneous ventral left thigh. No hemodynamically significant stenosis. No aneurysm or dissection. Veins: No obvious acute venous abnormality within the limitations of this arterial phase study. Review of the MIP images confirms the above findings. NON-VASCULAR Hepatobiliary: Cholelithiasis. No evidence of acute cholecystitis. Unremarkable liver and biliary tree. Pancreas: Unremarkable. Spleen: Unremarkable. Adrenals/Urinary Tract: Normal adrenal glands. Atrophic native kidneys with multiple small hypoattenuating lesions statistically likely to represent cysts. No follow-up recommended. Percutaneous nephrostomy tube in the left kidney. No urinary calculi or hydronephrosis. Irregular asymmetric left wall thickening about the nondistended bladder. 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 Herniation of a nonobstructed loop of small bowel into the parastomal hernia. No bowel wall thickening or obstruction. Stomach is within normal limits.  Lymphatic: No lymphadenopathy. Reproductive: No acute abnormality.  Penile prosthesis. Other: The presacral peripherally enhancing fluid collection has slightly increased in size compared to 09/06/2023 now measuring 4.7 x 5.5 cm in the axial plane, previously 3.7 x 5.5 cm. Extension of the fluid collection into the left ischial anal fossa. Mild adjacent stranding. No free intraperitoneal air. Musculoskeletal: No acute fracture. The presacral fluid collection abuts the anterior cortex of the sacrum which demonstrates mild irregularity suspicious for osteomyelitis. 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 Review of the MIP images confirms the above findings. IMPRESSION: 1. No acute aortic syndrome. 2. The presacral fluid collection has slightly increased in size compared to 09/06/2023. Extension of the fluid collection into the left ischioanal fossa. The presacral fluid collection abuts the anterior cortex of the sacrum which demonstrates mild irregularity suspicious for osteomyelitis. 3. Irregular asymmetric left bladder wall thickening similar to prior. Cystitis versus malignancy. 4. Cholelithiasis. 5. Stable 7 mm right lower lobe nodule. Electronically Signed   By: Rozell Cornet M.D.   On: 11/09/2023 00:56   DG Chest 2 View Result Date: 11/08/2023 CLINICAL DATA:  Shortness of breath and chest pain EXAM: CHEST - 2 VIEW COMPARISON:  Radiograph 07/07/2023 FINDINGS: Stable cardiomediastinal silhouette. Calcified tortuous aorta. Low lung volumes accentuate pulmonary vascularity. Chronic bronchitic changes. Left basilar atelectasis. No pleural effusion or pneumothorax. Right subclavian vascular stent. IMPRESSION: Chronic bronchitic changes.  Left basilar atelectasis/scarring. Electronically Signed   By: Rozell Cornet M.D.   On: 11/08/2023 23:06   IR NEPHROSTOMY EXCHANGE LEFT Result Date: 10/22/2023 INDICATION: Left percutaneous nephrostomy tube requiring routine exchange. EXAM: LEFT  PERCUTANEOUS NEPHROSTOMY TUBE EXCHANGE UNDER FLUOROSCOPY COMPARISON:  None Available. MEDICATIONS: None ANESTHESIA/SEDATION: None CONTRAST:  10  mL Omnipaque  300-administered into the collecting system(s) FLUOROSCOPY: Radiation Exposure Index (as provided by the fluoroscopic device): 16 mGy Kerma COMPLICATIONS: None immediate. PROCEDURE: Informed written consent was obtained from the patient after a thorough discussion of the procedural risks, benefits and alternatives. All questions were addressed. Maximal Sterile Barrier Technique was utilized including caps, mask, sterile gowns, sterile gloves, sterile drape, hand hygiene and skin antiseptic. A timeout was performed prior to the initiation of the procedure. A pre-existing 10 French percutaneous nephrostomy tube was injected with contrast material under fluoroscopy. The catheter was cut and removed over a guidewire. A new 10 French nephrostomy tube was advanced over the guidewire and formed at the level of the renal pelvis. The catheter was flushed with saline and attached to a new gravity drainage bag. IMPRESSION: Exchange of 10 French left percutaneous nephrostomy tube under fluoroscopy. Electronically Signed   By: Erica Hau M.D.   On: 10/22/2023 12:53    Microbiology: Recent Results (from the past 240 hours)  Culture, blood (Routine X 2) w Reflex to ID Panel     Status: None (Preliminary result)   Collection Time: 11/09/23  9:52 PM   Specimen: BLOOD  Result Value Ref Range Status   Specimen Description BLOOD BLOOD RIGHT ARM  Final   Special Requests   Final    BOTTLES DRAWN AEROBIC AND ANAEROBIC Blood Culture results may not be optimal due to an inadequate volume of blood received in culture bottles   Culture   Final    NO GROWTH 4 DAYS Performed at [facility], [address], [place], [place] [address]    Report Status PENDING  Incomplete  Culture, blood (Routine X 2) w Reflex to ID Panel     Status: None (Preliminary result)    Collection Time: 11/09/23  9:52 PM   Specimen: BLOOD  Result Value Ref Range Status   Specimen Description BLOOD BLOOD RIGHT HAND  Final   Special Requests   Final    BOTTLES DRAWN AEROBIC AND ANAEROBIC Blood Culture results may not be optimal due to an inadequate volume of blood received in culture bottles   Culture   Final    NO GROWTH 4 DAYS Performed at [facility], [address], [place], [place] [address]    Report Status PENDING  Incomplete  C Difficile Quick Screen w PCR reflex     Status: None   Collection Time: 11/10/23 10:53 AM   Specimen: STOOL  Result Value Ref Range Status   C Diff antigen NEGATIVE NEGATIVE Final   C Diff toxin NEGATIVE NEGATIVE Final   C Diff interpretation No C. difficile detected.  Final    Comment: Performed at [facility], [address] [place], [place] [address]  Aerobic/Anaerobic Culture w Gram Stain (surgical/deep wound)     Status: None (Preliminary result)   Collection Time: 11/11/23 11:10 AM   Specimen: Abdomen; Abscess  Result Value Ref Range Status   Specimen Description ABDOMEN  Final   Special Requests NONE  Final   Gram Stain   Final    ABUNDANT WBC PRESENT, PREDOMINANTLY PMN FEW GRAM NEGATIVE RODS RARE GRAM POSITIVE COCCI    Culture   Final    ABUNDANT ESCHERICHIA COLI Confirmed Extended Spectrum Beta-Lactamase Producer (ESBL).  In bloodstream infections from ESBL organisms, carbapenems are preferred over piperacillin /tazobactam. They are shown to have a lower risk of mortality. ABUNDANT ENTEROCOCCUS FAECALIS SUSCEPTIBILITIES TO FOLLOW HOLDING FOR POSSIBLE ANAEROBE Performed at [facility]  [facility], [address], [place], [place] [address]    Report Status PENDING  Incomplete   Organism ID, Bacteria ESCHERICHIA COLI  Final      Susceptibility   Escherichia coli - MIC*    AMPICILLIN >=32 RESISTANT Resistant     CEFEPIME  8 INTERMEDIATE Intermediate     CEFTAZIDIME RESISTANT Resistant     CEFTRIAXONE >=64  RESISTANT Resistant     CIPROFLOXACIN >=4 RESISTANT Resistant     GENTAMICIN <=1 SENSITIVE Sensitive     IMIPENEM <=0.25 SENSITIVE Sensitive     TRIMETH/SULFA >=320 RESISTANT Resistant     AMPICILLIN/SULBACTAM >=32 RESISTANT Resistant     PIP/TAZO 64 INTERMEDIATE Intermediate ug/mL    * ABUNDANT ESCHERICHIA COLI     Labs: Basic Metabolic Panel: Recent Labs  Lab 11/09/23 1804 11/10/23 1148 11/11/23 0706 11/12/23 0900 11/13/23 0303  NA 132* 131* 133* 128* 131*  K 4.4 4.2 4.2 4.7 5.1  CL 94* 93* 92* 93* 94*  CO2 20* 19* 23 19* 26  GLUCOSE 137* 102* 89 153* 109*  BUN 78* 95* 49* 72* 44*  CREATININE 11.24* 11.83* 7.34* 8.98* 5.62*  CALCIUM  8.9 9.1 9.1 8.7* 8.9  MG 2.2  --   --   --   --   PHOS  --  7.1*  --  5.3* 3.8   Liver Function Tests: Recent Labs  Lab 11/09/23 1804 11/10/23 1148 11/12/23 0900 11/13/23 0303  AST 16  --   --   --   ALT 32  --   --   --   ALKPHOS 77  --   --   --   BILITOT 1.1  --   --   --   PROT 6.5  --   --   --   ALBUMIN  2.6* 2.9* 2.8* 2.7*   No results for input(s): "LIPASE", "AMYLASE" in the last 168 hours. No results for input(s): "AMMONIA" in the last 168 hours. CBC: Recent Labs  Lab 11/10/23 0347 11/11/23 0005 11/11/23 0706 11/12/23 0900 11/13/23 0303  WBC 13.2* 9.7 10.6* 9.5 9.0  HGB 9.2* 8.4* 9.3* 8.6* 8.8*  HCT 27.2* 24.9* 28.2* 26.3* 26.6*  MCV 115.3* 115.3* 116.0* 116.9* 116.2*  PLT 226 204 204 206 202   Cardiac Enzymes: No results for input(s): "CKTOTAL", "CKMB", "CKMBINDEX", "TROPONINI" in the last 168 hours. BNP: BNP (last 3 results) No results for input(s): "BNP" in the last 8760 hours.  ProBNP (last 3 results) No results for input(s): "PROBNP" in the last 8760 hours.  CBG: No results for input(s): "GLUCAP" in the last 168 hours.     Signed:  Hilda Lovings MD.  [facility] 11/13/2023, 4:29 PM

## 2023-11-14 ENCOUNTER — Telehealth: Payer: Self-pay | Admitting: Nephrology

## 2023-11-14 LAB — AEROBIC/ANAEROBIC CULTURE W GRAM STAIN (SURGICAL/DEEP WOUND)

## 2023-11-14 LAB — CULTURE, BLOOD (ROUTINE X 2)
Culture: NO GROWTH
Culture: NO GROWTH

## 2023-11-14 NOTE — Telephone Encounter (Signed)
 Transition of care contact from inpatient facility  Date of Discharge: 11/13/23 Date of Contact: 11/14/23 Method of contact: Phone  Attempted to contact patient to discuss transition of care from inpatient admission. Patient did not answer the phone. Will continue outreach efforts.

## 2023-11-16 ENCOUNTER — Telehealth: Payer: Self-pay | Admitting: *Deleted

## 2023-11-16 NOTE — Transitions of Care (Post Inpatient/ED Visit) (Signed)
   11/16/2023  Name: Joshua Riley MRN: [id number] DOB: [birth year]  Today's TOC FU Call Status: Today's TOC FU Call Status:: Unsuccessful Call (1st Attempt) Unsuccessful Call (1st Attempt) Date: 11/16/23  Attempted to reach the patient regarding the most recent Inpatient/ED visit. Unidentified male answered phone and states pt unable to talk at present, states "getting him situated".  Follow Up Plan: Additional outreach attempts will be made to reach the patient to complete the Transitions of Care (Post Inpatient/ED visit) call.   Cecilie Coffee [facility], BSN RN Care Manager/ Transition of Care [place]/ [facility] [phone]

## 2023-11-17 ENCOUNTER — Encounter: Payer: Self-pay | Admitting: Cardiology

## 2023-11-17 ENCOUNTER — Other Ambulatory Visit: Payer: Self-pay | Admitting: Internal Medicine

## 2023-11-17 ENCOUNTER — Ambulatory Visit: Admitting: Family Medicine

## 2023-11-17 ENCOUNTER — Telehealth: Payer: Self-pay | Admitting: *Deleted

## 2023-11-17 DIAGNOSIS — K651 Peritoneal abscess: Secondary | ICD-10-CM

## 2023-11-17 NOTE — Transitions of Care (Post Inpatient/ED Visit) (Signed)
 11/17/2023  Name: Joshua Riley MRN: [id number] DOB: 1935-08-24  Today's TOC FU Call Status: Today's TOC FU Call Status:: Successful TOC FU Call Completed TOC FU Call Complete Date: 11/17/23 Patient's Name and Date of Birth confirmed.  Transition Care Management Follow-up Telephone Call Date of Discharge: 11/13/23 Discharge Facility: [facility]) Type of Discharge: Inpatient Admission Primary Inpatient Discharge Diagnosis:: Chest pain How have you been since you were released from the hospital?:  (dialysis 3 x week, eating, drinking well, ambulates with walker, cane) Any questions or concerns?: No  Items Reviewed: Did you receive and understand the discharge instructions provided?: Yes Medications obtained,verified, and reconciled?: Yes (Medications Reviewed) Any new allergies since your discharge?: No Dietary orders reviewed?: Yes Type of Diet Ordered:: renal,  heart healthy Do you have support at home?: Yes People in Home [RPT]: alone Name of Support/Comfort Primary Source: daughter Joshua Riley sees pt daily and provides oversight   Goals Addressed             This Visit's Progress    VBCI Transitions of Care (TOC) Care Plan       Problems:  Recent Hospitalization for treatment of acute pericarditis, NSTEMI Per daughter , Joshua Riley (DPR), pt lives alone, she comes in daily to check on patient, daughter will be going on vacation and has hired someone to come in and assist with in the next week, pt goes to dialysis 3 x week, Joshua Riley will be working with pt, pt is independent with care of 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  Goal:  Over the next 30 days, the patient will not experience hospital readmission  Interventions:   Evaluation of current treatment plan related to acute pericarditis, NSTEMI, self-management and patient's adherence to plan as established by provider. Discussed plans with patient  for ongoing care management follow up and provided patient with direct contact information for care management team Evaluation of current treatment plan related to acute pericarditis, NSTEMI and patient's adherence to plan as established by provider Provided education to patient re: signs/ symptoms of infection, reportable signs/ symptoms Reviewed medications with patient and discussed importance of taking as prescribed Collaborated with Joshua Riley at [facility] regarding start date- per Joshua Riley they will try to see pt on 11/19/23 and will call daughter the night before to schedule, daughter verbalizes understanding and has contact # Reviewed scheduled/upcoming provider appointments including primary care provider 12/03/23 @ 3 pm Discussed plans with patient for ongoing care management follow up and provided patient with direct contact information for care management team Screening for signs and symptoms of depression related to chronic disease state  Assessed social determinant of Riley barriers Reviewed signs/ symptoms MI, verified pt has nitroglycerin  on hand, knows to activate emergency services if needed  Patient Self Care Activities:  Attend all scheduled provider appointments Attend church or other social activities Call pharmacy for medication refills 3-7 days in advance of running out of medications Call provider office for new concerns or questions  Notify RN Care Manager of TOC call rescheduling needs Participate in Transition of Care Program/Attend TOC scheduled calls Take medications as prescribed   Please report any signs/ symptoms of infection to your doctor Joshua Riley will see you for a visit this week Use nitroglycerin  as prescribed for chest pain, call 911 for unrelieved chest pain  Plan:  Telephone follow up appointment with care management team member scheduled for:  11/24/23 @ 1015 am  Medications Reviewed Today: Medications Reviewed Today      Reviewed by Joshua Earl, RN (Registered Nurse) on 11/17/23 at 1204  Med List Status: <None>   Medication Order Taking? Sig Documenting Provider Last Dose Status Informant  albuterol  (PROVENTIL ) (2.5 MG/3ML) 0.083% nebulizer solution [id number] Yes Take 2.5 mg by nebulization every 6 (six) hours as needed for wheezing or shortness of breath. [provider] Taking Active Self  albuterol  (VENTOLIN  HFA) 108 (90 Base) MCG/ACT inhaler [id number] Yes Inhale 1-2 puffs into the lungs every 6 (six) hours as needed for wheezing or shortness of breath. [provider] Taking Active Self  atorvastatin  (LIPITOR) 20 MG tablet [id number] Yes Take 1 tablet (20 mg total) by mouth daily. Joshua Landau, MD Taking Active   budesonide  (PULMICORT ) 0.5 MG/2ML nebulizer solution [id number] Yes Take 0.5 mg by nebulization 2 (two) times daily as needed (for respiratory flares). [provider] Taking Active Self  Cyanocobalamin (VITAMIN B-12 PO) [id number] Yes Take 1 capsule by mouth in the morning and at bedtime. [provider] Taking Active Self  dorzolamide  (TRUSOPT ) 2 % ophthalmic solution [id number] Yes Place 1 drop into the right eye at bedtime. [provider] Taking Active Self           Med Note Joshua Riley, Joshua Riley Nov 09, 2023  8:36 PM) Per the patient: "orange top- right eye only at bedtime"  ELIQUIS  2.5 MG TABS tablet [id number] Yes Take 1 tablet (2.5 mg total) by mouth 2 (two) times daily. Joshua Dust, MD Taking Active Self           Med Note Joshua Riley, Joshua Riley Sep 03, 2023 11:18 AM)    ertapenem  (INVANZ ) IVPB [id number] Yes Inject 1 g into the vein every Monday, Wednesday, and Friday with hemodialysis for 19 days. **To be given at dialysis** Indication: Sacral osteomyelitis  First Dose: Yes  Last Day of Therapy:  5/28 Labs - Once weekly:  CBC/D and BMP, Labs - Once weekly: ESR and CRP Method of administration: Mini-Bag Plus / Gravity Method  of administration may be changed at the discretion of home infusion pharmacist based upon assessment of the patient and/or caregiver's ability to self-administer the medication ordered. Joshua Landau, MD Taking Active   ertapenem  1 g in sodium chloride  0.9 % 100 mL [id number] Yes Inject 1 g into the vein daily for 19 days. Joshua Landau, MD Taking Active   folic acid  (FOLVITE ) 1 MG tablet [id number] Yes Take 1 tablet (1 mg total) by mouth daily. Regalado, Brigido Canales, MD Taking Active Self  gabapentin  (NEURONTIN ) 300 MG capsule [id number] Yes Take 300 mg by mouth at bedtime. [provider] Taking Active Self  ibuprofen  (ADVIL ) 800 MG tablet [id number] Yes Take 1 tablet (800 mg total) by mouth 3 (three) times daily for 7 days. Joshua Landau, MD Taking Active   loperamide  (IMODIUM ) 2 MG capsule [id number] Yes Take 1 capsule (2 mg total) by mouth as needed for diarrhea or loose stools. Joshua Landau, MD Taking Active   midodrine  (PROAMATINE ) 5 MG tablet [id number] Yes Take 3 tablets (15 mg total) by mouth 3 (three) times daily. Joshua Landau, MD Taking Active   mometasone -formoterol  (DULERA ) 200-5 MCG/ACT Sudie Ely [id number] Yes Inhale 2 puffs into the lungs 2 (two) times daily. Doroteo Gasmen, MD Taking Active Self  nitroGLYCERIN  (NITROSTAT ) 0.4 MG SL tablet [id number] Yes Place 1 tablet (0.4 mg total) under the tongue every  5 (five) minutes as needed for chest pain. Joshua Landau, MD Taking Active   Omega Fatty Acids-Vitamins (OMEGA-3 GUMMIES) CHEW [id number] Yes Chew 1 tablet by mouth in the morning and at bedtime. [provider] Taking Active Self  pantoprazole  (PROTONIX ) 40 MG tablet [id number] Yes Take 1 tablet (40 mg total) by mouth 2 (two) times daily. Joshua Landau, MD Taking Active   ramelteon  (ROZEREM ) 8 MG tablet [id number] Yes Take 1 tablet (8 mg total) by mouth at bedtime.  Patient taking differently: Take 8 mg by mouth at bedtime as needed for sleep.    Almira Jaeger, MD Taking Active Self  sertraline  (ZOLOFT ) 50 MG tablet [id number] No Take 25 mg by mouth at bedtime.  Patient not taking: Reported on 11/17/2023   [provider] Not Taking Active Self  sevelamer  carbonate (RENVELA ) 800 MG tablet [id number] Yes Take 1,600 mg by mouth 3 (three) times daily with meals. [provider] Taking Active Self  Sodium Chloride  Flush (SALINE FLUSH) 0.9 % SOLN [id number] Yes Use 5 mLs by Intracatheter route daily as directed. [place], [place], NP Taking Active   timolol  (TIMOPTIC ) 0.5 % ophthalmic solution [id number] Yes Place 1 drop into both eyes in the morning and at bedtime. [provider] Taking Active Self           Med Note Joshua Riley, Joshua Riley Nov 09, 2023  8:35 PM) Per the patient: "yellow top- both eyes twice a day"   traMADol  (ULTRAM ) 50 MG tablet [id number] Yes Take 1 tablet (50 mg total) by mouth every 8 (eight) hours as needed (for pain). Joshua Landau, MD Taking Active   TYLENOL  325 MG tablet [id number] Yes Take 325-650 mg by mouth every 6 (six) hours as needed for mild pain (pain score 1-3) or headache. [provider] Taking Active Self  vancomycin  IVPB [id number] Yes Inject 750 mg into the vein every Monday, Wednesday, and Friday with hemodialysis for 19 days. **To be given at dialysis** Indication: Sacral osteomyelitis  First Dose: Yes  Last Day of Therapy:  5/28 Labs - Once weekly:  CBC/D and BMP, Labs - Once weekly: ESR and CRP Method of administration: Mini-Bag Plus / Gravity Method of administration may be changed at the discretion of home infusion pharmacist based upon assessment of the patient and/or caregiver's ability to self-administer the medication ordered. Joshua Landau, MD Taking Active   Med List Note Niles Base, CPhT 06/02/22 1426): Dialysis [place] [facility], Friday            Home Care and Equipment/Supplies: Were Home Riley Services Ordered?: Yes Name of Home  [facility] Agency::  (RN CM spoke with Joshua Riley, they plan to see pt in the home on 11/19/23, will call night before to set up a time, patient's daughter verbalizes understanding and has Joshua contact #) Has Agency set up a time to come to your home?: No EMR reviewed for Home [facility] Orders: Orders present/patient has not received call (refer to CM for follow-up) Any new equipment or medical supplies ordered?: No  Functional Questionnaire: Do you need assistance with bathing/showering or dressing?: Yes (shower chair,  walk in shower) Do you need assistance with meal preparation?: No Do you need assistance with eating?: No Do you have difficulty maintaining continence: No Do you need assistance with getting out of bed/getting out of a chair/moving?: Yes (walker, cane) Do you have difficulty managing or taking your medications?: No  Follow up appointments reviewed: PCP Follow-up  appointment confirmed?: Yes Date of PCP follow-up appointment?: 12/03/23 Follow-up Provider: Dr. Clarisa Crooked Specialist [facility] Follow-up appointment confirmed?: Yes Date of Specialist follow-up appointment?: 12/02/23 Follow-Up Specialty Provider:: Dr. Ernie Heal  @ 915 am Do you need transportation to your follow-up appointment?: No Do you understand care options if your condition(s) worsen?: Yes-patient verbalized understanding  SDOH Interventions Today    Flowsheet Row Most Recent Value  SDOH Interventions   Food Insecurity Interventions Intervention Not Indicated  Housing Interventions Intervention Not Indicated  Transportation Interventions Intervention Not Indicated  Utilities Interventions Intervention Not Indicated       Cecilie Coffee [facility], BSN RN Care Manager/ Transition of Care [place]/ [facility] Population [facility] [phone]

## 2023-11-18 ENCOUNTER — Telehealth: Payer: Self-pay | Admitting: Family Medicine

## 2023-11-18 NOTE — Telephone Encounter (Signed)
 Received faxed  document Home Health Certificate (Order ID [id number] ), to be filled out by provider. Patient requested to send it back via Fax . Document is located in providers tray at front office.Please advise

## 2023-11-18 NOTE — Telephone Encounter (Signed)
 Paperwork is on [employer].

## 2023-11-19 NOTE — Telephone Encounter (Signed)
 Forms completed and faxed back.

## 2023-11-24 ENCOUNTER — Encounter: Payer: Self-pay | Admitting: *Deleted

## 2023-11-24 ENCOUNTER — Telehealth: Payer: Self-pay | Admitting: *Deleted

## 2023-11-25 ENCOUNTER — Other Ambulatory Visit (HOSPITAL_COMMUNITY): Payer: Self-pay | Admitting: Radiology

## 2023-11-25 ENCOUNTER — Ambulatory Visit
Admission: RE | Admit: 2023-11-25 | Discharge: 2023-11-25 | Disposition: A | Discharge status: Home / Self Care | Source: Ambulatory Visit | Attending: Internal Medicine | Admitting: Internal Medicine

## 2023-11-25 ENCOUNTER — Other Ambulatory Visit (HOSPITAL_COMMUNITY): Payer: Self-pay

## 2023-11-25 ENCOUNTER — Encounter: Payer: Self-pay | Admitting: *Deleted

## 2023-11-25 ENCOUNTER — Ambulatory Visit
Admission: RE | Admit: 2023-11-25 | Discharge: 2023-11-25 | Disposition: A | Discharge status: Home / Self Care | Source: Ambulatory Visit

## 2023-11-25 DIAGNOSIS — R188 Other ascites: Secondary | ICD-10-CM

## 2023-11-25 DIAGNOSIS — K651 Peritoneal abscess: Secondary | ICD-10-CM

## 2023-11-25 MED ORDER — SALINE FLUSH 0.9 % IV SOLN
5.0000 mL | Freq: Every day | INTRAVENOUS | 1 refills | Status: DC
  Filled 2023-11-25 (×5): qty 150, 30d supply, fill #0
  Filled 2023-11-25: qty 150, 15d supply, fill #0

## 2023-11-25 MED ORDER — IOPAMIDOL (ISOVUE-300) INJECTION 61%
100.0000 mL | Freq: Once | INTRAVENOUS | Status: AC | PRN
  Administered 2023-11-25: 100 mL via INTRAVENOUS

## 2023-11-25 NOTE — Progress Notes (Deleted)
 Cardiology Office Note:    Date:  11/25/2023   ID:  Joshua Riley, DOB Dec 15, 1935, MRN [id number]  PCP:  Joshua Jaeger, Joshua Riley  Cardiologist:  Joshua Grates, Joshua Riley { Click to update primary Joshua Riley,subspecialty Joshua Riley or APP then REFRESH:1}    Referring Joshua Riley: Joshua Jaeger, Joshua Riley   Chief Complaint: hospital follow-up of pericarditis  History of Present Illness:    Joshua Riley is a 88 y.o. 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  Patient was previously followed by Cardiology in [place]  but was first seen by [facility] in 05/2022 after moving to the area. He has a history of persistent atrial fibrillation and has been maintained on Eliquis . He has not been on any AV nodal agents due to hypotension with hemodialysis requiring Midodrine . Echo in 05/2022 for further evaluation of dyspnea showed LVEF of 60-65% with no regional wall motion abnormalities, mild LVH, and grade 1 diastolic dysfunction as well as borderline dilatation of the aortic root and mild dilatation of the ascending aorta measuring 38mm and 42mm. He underwent LHC in 07/2023 due to persistent dyspnea which showed normal coronaries and normal LVEDP. Monitor in 07/2022 showed underlying sinus rhythm with 2,759 run of SVT (longest run 14 beats), intermittent episodes of 2nd degree block Mobitz I (Wenckebach), isolated PACs (4.1%), and rare PACs (1.0%).   He has had multiple admissions in 2025. He was admitted in 07/2023 for sepsis secondary to  an infected right groin abscess. General Surgery was consulted and IR placed a drain. He was treated with IV antibiotics. Hospitalization was complicated by small bowel obstruction vs ileus, atrial fibrillation requiring IV Amiodarone  which was subsequently stopped due to bradycardia, acute on chronic anemia requiring blood transfusion. He was admitted in 09/2023 for small bowel obstruction. General Surgery was consulted and he was treated conservatively. She had hyperkalemia during admission which was treated with Lokelma  and hemodialysis. CT also showed a moderate right hydroureter distally and outpatient Urology referral was recommended.   He was most recently admitted from 11/08/2023 to 11/13/2023 after presenting with chest pain and shortness of breath. EKG showed no acute ischemic changes. High-sensitivity troponin peaked at 428. Chest CTA showed no acute aortic syndrome but did showed extension of the presacral fluid collection into the left ischional fossa as well as irregular asymmetric left bladder wall thickening similar to prior (cystitis vs malignancy). WBC 15.8, Hgb 9.8, Plts 197. Na 136, K 3.7, Glucose 167, BUN 67, Cr 10.10. Patient was started on Vancomycin  and Meropnem given concern for possible sacral osteomyelitis. He was started on IV Heparin  given elevated troponin. Echo LVEF of 55-60% with no regional wall motion abnormalities and grade 1 diastolic dysfunction, normal RV function with moderately elevated RVSP, mild AI, moderate TR, and mild dilatation of the ascending aorta measuring 40 mm. He was seen by Cardiology and felt to have pericarditis. Short course of high-dose NSAIDs was recommended for 10 days. He was not started on Colchicine given ESRD. In regards to his sacral osteomyelitis, he was seen by General Surgery and was not felt to have any surgical options.  IR placed a drain on 5/7 with cultures growing abundant E.coli and Enterococcus faecalis.   Patient presents today for follow-up.  ***  Pericarditis Patient was recently admitted in early 11/2023 for chest pain felt to be secondary to pericarditis. Echo showed LVEF of 55-60% with no regional wall motion abnormalities and grade 1 diastolic dysfunction, normal RV function with moderately elevated RVSP, mild AI, moderate TR, and mild dilatation of the ascending aorta measuring 40 mm. He was treated with a short course of high dose NSAIDs but no Colchicine given ESRD. - *** - He completed 10 day course of high dose NSAIDs. ***   Persistent Atrial Fibrillation Maintaining sinus rhythm on exam. - Not on any AV nodal agents due to hypotension requiring Midodrine .  - Continue chronic anticoagulation with Eliquis  2.5mg  twice daily at home. Reduced dose due to age and renal function.  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  - I don't think he would be a candidate for any surgery if these were to progress given advanced age, frailty, and multiple comorbidities. Will defer need for routine surveillance of these to primary Cardiologist.  Hypotension Patient has history of hypotension, especially on dialysis days. - BP stable. *** - Currently on Midodrine  15mg  three times daily per Nephrology.   ESRD On dialysis on M/W/F. - Management per Nephrology.  EKGs/Labs/Other Studies Reviewed:    The following studies were reviewed:  Left Cardiac Catheterization 07/22/2022:   The left ventricular systolic function is normal.   LV end diastolic pressure is normal.   The left ventricular ejection fraction is 55-65% by visual estimate.   No significant CAD Normal LV function Normal LVEDP   Plan: medical management   Diagnostic Dominance: Right    _______________   Monitor 07/2022:   Patch wear time was 6 days and 21 hours   Predominant rhythm is NSR with average HR 75bpm   There were 2759 runs of SVT with longest lasting 14 beats    Mobitz type I ABV was present   Occasional SVE (4.1%), rare VE (<1%)   No Afib or significant pauses   No patient triggered events. _______________  Echocardiogram 11/10/2023: Impressions: 1. Left ventricular ejection fraction, by estimation, is 55 to 60%. The  left ventricle has normal function. The left ventricle has no regional  wall motion abnormalities. Left ventricular diastolic parameters are  consistent with Grade I diastolic  dysfunction (impaired relaxation).   2. Right ventricular systolic function is normal. The right ventricular  size is moderately enlarged.   3. The mitral valve is normal in structure. No evidence of mitral valve  regurgitation. No evidence of mitral stenosis.   4. Tricuspid valve regurgitation is moderate.   5. The aortic valve is calcified. There is mild calcification of the  aortic valve. There is mild thickening of the aortic valve. Aortic valve  regurgitation is mild. Aortic valve sclerosis is present, with no evidence  of aortic valve stenosis. Aortic  valve mean gradient measures 4.0 mmHg. Aortic valve Vmax measures 1.46  m/s.   6. Aorta measured 40 mm on CT chest 11/09/2023. Aortic dilatation noted.  There is mild dilatation of the ascending aorta, measuring 40 mm.   7. The inferior vena cava is normal in size with greater than 50%  respiratory variability, suggesting right atrial pressure of 3 mmHg.    EKG:  EKG not ordered today.  Recent Labs: 07/25/2023: TSH 4.875 11/09/2023: ALT 32;  Magnesium  2.2 11/13/2023: BUN 44; Creatinine, Ser 5.62; Hemoglobin 8.8; Platelets 202; Potassium 5.1; Sodium 131  Recent Lipid Panel    Component Value Date/Time   CHOL 76 11/10/2023 0347   TRIG 180 (H) 11/10/2023 0347   HDL 25 (L) 11/10/2023 0347   CHOLHDL 3.0 11/10/2023 0347   VLDL 36 11/10/2023 0347   LDLCALC 15 11/10/2023 0347    Physical Exam:    Vital Signs: There were no vitals taken for this visit.    Wt Readings from Last 3 Encounters:   11/13/23 155 lb 3.3 oz (70.4 kg)  10/27/23 154 lb 12.8 oz (70.2 kg)  10/27/23 155 lb 12.8 oz (70.7 kg)     General: 88 y.o. male in no acute distress. HEENT: Normocephalic and atraumatic. Sclera clear.  Neck: Supple. No carotid bruits. No JVD. Heart: *** RRR. Distinct S1 and S2. No murmurs, gallops, or rubs.  Lungs: No increased work of breathing. Clear to ausculation bilaterally. No wheezes, rhonchi, or rales.  Abdomen: Soft, non-distended, and non-tender to palpation.  Extremities: No lower extremity edema.  Radial and distal pedal pulses 2+ and equal bilaterally. Skin: Warm and dry. Neuro: No focal deficits. Psych: Normal affect. Responds appropriately.   Assessment:    No diagnosis found.  Plan:     Disposition: Follow up in ***   Signed, Casimer Clear, PA-C  11/25/2023 5:35 PM    [facility]

## 2023-11-25 NOTE — Progress Notes (Signed)
 Chief Complaint: Patient was seen in consultation today for pelvic fluid collection   at the request of Singh,Mayanka  Referring Physician(s): Singh,Mayanka  Supervising Physician: Myrlene Asper  History of Present Illness: Joshua Riley is a 88 y.o. male with history of ESRD-on hemodialysis, depression, obstructive uropathy-s/p left PCN placement, ulcerative colitis-s/p colectomy with ileostomy, DVT, anemia and chronic pelvic fluid collection-previous drain placed by IR 07/13/23 and fell out 08/2023. Patient underwent left transgluteal drain placement with Dr. Mabel Savage 11/11/23 due to a chronic pre sacral fluid collection. Patient reports around 30 mL of serous output. He reports occasional pain at insertion site. Denies: chest pain, shortness of breath, abdominal pain, fever, nausea, vomiting, and/or diarrhea.   Past Medical History:  Diagnosis Date   A-fib (HCC)    Anemia    Arthritis    Cancer (HCC)    Basal cell   COVID-19    2021   Dysrhythmia    Afib-controlled on eliquis    ESRD (end stage renal disease) (HCC) 10/22/2021   Glaucoma 11/18/2021   History of DVT (deep vein thrombosis)    Hydronephrosis    managed wtih a PCN   Idiopathic neuropathy 10/22/2021   lyrica     Ileostomy in place [facility])    Obstructive uropathy    With chronic left nephrostomy   Old retinal detachment, total or subtotal    Orthostatic hypotension 10/22/2021   Sleep apnea    does not need a machine   Stroke (HCC)    Ulcerative colitis (HCC)    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    Past Surgical History:  Procedure Laterality Date   BASAL CELL CARCINOMA EXCISION     10/23   COLON SURGERY     creation of j pouch     and subsequent takedown of j pouch   EYE SURGERY     IR NEPHROSTOMY EXCHANGE LEFT  12/10/2021   IR NEPHROSTOMY EXCHANGE LEFT  04/22/2022   IR NEPHROSTOMY EXCHANGE LEFT  07/29/2022   IR NEPHROSTOMY EXCHANGE LEFT  10/28/2022   IR NEPHROSTOMY EXCHANGE  LEFT  02/05/2023   IR NEPHROSTOMY EXCHANGE LEFT  05/07/2023   IR NEPHROSTOMY EXCHANGE LEFT  06/25/2023   IR NEPHROSTOMY EXCHANGE LEFT  07/17/2023   IR NEPHROSTOMY EXCHANGE LEFT  10/22/2023   LEFT HEART CATH AND CORONARY ANGIOGRAPHY N/A 07/22/2022   Procedure: LEFT HEART CATH AND CORONARY ANGIOGRAPHY;  Surgeon: [place], Peter M, MD;  Location: [facility] INVASIVE CV LAB;  Service: Cardiovascular;  Laterality: N/A;   REVISION OF ARTERIOVENOUS GORETEX GRAFT Left 05/06/2022   Procedure: REDO LEFT THIGH ARTERIOVENOUS 4-7 MM GORETEX GRAFT;  Surgeon: Dannis Dy, MD;  Location: [facility] OR;  Service: Vascular;  Laterality: Left;   SMALL INTESTINE SURGERY     TOTAL COLECTOMY      Allergies: Cephalosporins, Ciprofloxacin, and Baclofen  Medications: Prior to Admission medications   Medication Sig Start Date End Date Taking? Authorizing Provider  albuterol  (PROVENTIL ) (2.5 MG/3ML) 0.083% nebulizer solution Take 2.5 mg by nebulization every 6 (six) hours as needed for wheezing or shortness of breath.    [provider]  albuterol  (VENTOLIN  HFA) 108 (90 Base) MCG/ACT inhaler Inhale 1-2 puffs into the lungs every 6 (six) hours as needed for wheezing or shortness of breath.    [provider]  atorvastatin  (LIPITOR) 20 MG tablet Take 1 tablet (20 mg total) by mouth daily. 11/14/23   Armenta Landau, MD  budesonide  (PULMICORT ) 0.5 MG/2ML nebulizer solution  Take 0.5 mg by nebulization 2 (two) times daily as needed (for respiratory flares).    [provider]  Cyanocobalamin (VITAMIN B-12 PO) Take 1 capsule by mouth in the morning and at bedtime.    [provider]  dorzolamide  (TRUSOPT ) 2 % ophthalmic solution Place 1 drop into the right eye at bedtime. 06/17/23   [provider]  ELIQUIS  2.5 MG TABS tablet Take 1 tablet (2.5 mg total) by mouth 2 (two) times daily. 08/07/22   Sonny Dust, MD  ertapenem  (INVANZ ) IVPB Inject 1 g into the vein every Monday,  Wednesday, and Friday with hemodialysis for 19 days. **To be given at dialysis** Indication: Sacral osteomyelitis  First Dose: Yes  Last Day of Therapy:  5/28 Labs - Once weekly:  CBC/D and BMP, Labs - Once weekly: ESR and CRP Method of administration: Mini-Bag Plus / Gravity Method of administration may be changed at the discretion of home infusion pharmacist based upon assessment of the patient and/or caregiver's ability to self-administer the medication ordered. 11/13/23 12/02/23  Armenta Landau, MD  ertapenem  1 g in sodium chloride  0.9 % 100 mL Inject 1 g into the vein daily for 19 days. 11/13/23 12/02/23  Armenta Landau, MD  folic acid  (FOLVITE ) 1 MG tablet Take 1 tablet (1 mg total) by mouth daily. 07/21/23   Regalado, Belkys A, MD  gabapentin  (NEURONTIN ) 300 MG capsule Take 300 mg by mouth at bedtime. 09/09/23   [provider]  loperamide  (IMODIUM ) 2 MG capsule Take 1 capsule (2 mg total) by mouth as needed for diarrhea or loose stools. 11/13/23   Armenta Landau, MD  midodrine  (PROAMATINE ) 5 MG tablet Take 3 tablets (15 mg total) by mouth 3 (three) times daily. 11/13/23   Armenta Landau, MD  mometasone -formoterol  (DULERA ) 200-5 MCG/ACT AERO Inhale 2 puffs into the lungs 2 (two) times daily. 09/08/23   Doroteo Gasmen, MD  nitroGLYCERIN  (NITROSTAT ) 0.4 MG SL tablet Place 1 tablet (0.4 mg total) under the tongue every 5 (five) minutes as needed for chest pain. 11/13/23   Armenta Landau, MD  Omega Fatty Acids-Vitamins (OMEGA-3 GUMMIES) CHEW Chew 1 tablet by mouth in the morning and at bedtime.    [provider]  pantoprazole  (PROTONIX ) 40 MG tablet Take 1 tablet (40 mg total) by mouth 2 (two) times daily. 11/13/23   Armenta Landau, MD  ramelteon  (ROZEREM ) 8 MG tablet Take 1 tablet (8 mg total) by mouth at bedtime. Patient taking differently: Take 8 mg by mouth at bedtime as needed for sleep. 10/27/23   Almira Jaeger, MD  sertraline  (ZOLOFT ) 50 MG tablet Take 25  mg by mouth at bedtime. Patient not taking: Reported on 11/17/2023    [provider]  sevelamer  carbonate (RENVELA ) 800 MG tablet Take 1,600 mg by mouth 3 (three) times daily with meals.    [provider]  Sodium Chloride  Flush (SALINE FLUSH) 0.9 % SOLN Use 5 mLs by Intracatheter route daily as directed. 11/13/23   Huneycutt, [place], NP  timolol  (TIMOPTIC ) 0.5 % ophthalmic solution Place 1 drop into both eyes in the morning and at bedtime.    [provider]  traMADol  (ULTRAM ) 50 MG tablet Take 1 tablet (50 mg total) by mouth every 8 (eight) hours as needed (for pain). 11/13/23   Armenta Landau, MD  TYLENOL  325 MG tablet Take 325-650 mg by mouth every 6 (six) hours as needed for mild pain (pain score 1-3) or  headache.    [provider]  vancomycin  IVPB Inject 750 mg into the vein every Monday, Wednesday, and Friday with hemodialysis for 19 days. **To be given at dialysis** Indication: Sacral osteomyelitis  First Dose: Yes  Last Day of Therapy:  5/28 Labs - Once weekly:  CBC/D and BMP, Labs - Once weekly: ESR and CRP Method of administration: Mini-Bag Plus / Gravity Method of administration may be changed at the discretion of home infusion pharmacist based upon assessment of the patient and/or caregiver's ability to self-administer the medication ordered. 11/13/23 12/02/23  Armenta Landau, MD     Family History  Problem Relation Age of Onset   Stroke Mother    Cancer Father    Esophageal cancer Brother     Social History   Socioeconomic History   Marital status: Widowed    Spouse name: Not on file   Number of children: Not on file   Years of education: Not on file   Highest education level: 12th grade  Occupational History   Not on file  Tobacco Use   Smoking status: Former    Current packs/day: 0.00    Average packs/day: 2.0 packs/day for 6.0 years (12.0 ttl pk-yrs)    Types: Cigarettes    Start date: 38    Quit date: 15    Years  since quitting: 40.4    Passive exposure: Never   Smokeless tobacco: Never  Vaping Use   Vaping status: Never Used  Substance and Sexual Activity   Alcohol use: Yes    Alcohol/week: 5.0 standard drinks of alcohol    Types: 5 Shots of liquor per week    Comment: socially   Drug use: Never   Sexual activity: Not Currently  Other Topics Concern   Not on file  Social History Narrative   Widowed- lost wife of 13 years to pancreatic cancer- previously married 43 years. Son in [place] and daughter helping in [place]. 4 grandkids.       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       Hobbies: dinner with family- occasional martini   Social Drivers of Health   Financial Resource Strain: Low Risk  (10/26/2023)   Overall Financial Resource Strain (CARDIA)    Difficulty of Paying Living Expenses: Not hard at all  Food Insecurity: No Food Insecurity (11/17/2023)   Hunger Vital Sign    Worried About Running Out of Food in the Last Year: Never true    Ran Out of Food in the Last Year: Never true  Transportation Needs: No Transportation Needs (11/17/2023)   PRAPARE - Administrator, Civil Service (Medical): No    Lack of Transportation (Non-Medical): No  Physical Activity: Insufficiently Active (10/26/2023)   Exercise Vital Sign    Days of Exercise per Week: 1 day    Minutes of Exercise per Session: 10 min  Stress: Stress Concern Present (10/26/2023)   [employer] of Occupational Health - Occupational Stress Questionnaire    Feeling of Stress : To some extent  Social Connections: Moderately Integrated (11/09/2023)   Social Connection and Isolation Panel [NHANES]    Frequency of Communication with Friends and Family: More than three times a week    Frequency of Social Gatherings with Friends and Family: Twice a week    Attends Religious Services: More than 4 times per year    Active Member of [employer] or Organizations: Yes    Attends Banker  Meetings:  More than 4 times per year    Marital Status: Widowed    Review of Systems: A 12 point ROS discussed and pertinent positives are indicated in the HPI above.  All other systems are negative.  Review of Systems  Constitutional:  Negative for fever.  Respiratory:  Negative for shortness of breath.   Cardiovascular:  Negative for chest pain.  Gastrointestinal:  Negative for anal bleeding, diarrhea, nausea and vomiting.    Vital Signs: There were no vitals taken for this visit.  Physical Exam Pulmonary:     Effort: Pulmonary effort is normal.  Abdominal:     General: Abdomen is flat.  Musculoskeletal:     Cervical back: Normal range of motion.  Skin:    General: Skin is warm.     Comments: Left transgluteal drain in place. No surrounding erythema, edema or drainage. Roughly 10mL serous drainage present in bulb. No TTP at insertion site.   Neurological:     General: No focal deficit present.     Mental Status: He is alert and oriented to person, place, and time.  Psychiatric:        Thought Content: Thought content normal.     Mallampati Score:     Imaging: CT GUIDED PERITONEAL/RETROPERITONEAL FLUID DRAIN BY PERC CATH Result Date: 11/11/2023 INDICATION: 88 year old male with a history of presacral fluid collection referred for drainage EXAM: CT-GUIDED ABSCESS DRAIN TECHNIQUE: Multidetector CT imaging of the pelvis was performed following the standard protocol without IV contrast. RADIATION DOSE REDUCTION: This exam was performed according to the departmental dose-optimization program which includes automated exposure control, adjustment of the mA and/or kV according to patient size and/or use of iterative reconstruction technique. MEDICATIONS: The patient is currently admitted to the hospital and receiving intravenous antibiotics. The antibiotics were administered within an appropriate time frame prior to the initiation of the procedure. ANESTHESIA/SEDATION: Moderate (conscious)  sedation was employed during this procedure. A total of Versed  1.0 mg and Fentanyl  50 mcg was administered intravenously by the radiology nurse. Total intra-service moderate Sedation Time: 14 minutes. The patient's level of consciousness and vital signs were monitored continuously by radiology nursing throughout the procedure under my direct supervision. COMPLICATIONS: None PROCEDURE: Informed written consent was obtained from the patient after a thorough discussion of the procedural risks, benefits and alternatives. All questions were addressed. Maximal Sterile Barrier Technique was utilized including caps, mask, sterile gowns, sterile gloves, sterile drape, hand hygiene and skin antiseptic. A timeout was performed prior to the initiation of the procedure. Patient was positioned right decubitus on the CT gantry table. Scout CT acquired for planning purposes. The patient is prepped and draped in the usual sterile fashion. 1% lidocaine  was used for local anesthesia. Using CT guidance, a Yueh needle was advanced into the presacral location from a left transgluteal approach. Once we confirmed the catheter position modified Seldinger technique was used to place a 10 [place] drain. Approximately 20 cc of frankly purulent material aspirated for culture. Drain was attached to bulb suction. Final image was stored. Patient tolerated the procedure well and remained hemodynamically stable throughout. No complications were encountered and no significant blood loss. IMPRESSION: Status post CT-guided left transgluteal drain for presacral abscess. Signed, Marciano Settles. Rexine Cater, RPVI [facility] Radiology Electronically Signed   By: Myrlene Asper D.O.   On: 11/11/2023 17:10   ECHOCARDIOGRAM COMPLETE Result Date: 11/10/2023    ECHOCARDIOGRAM REPORT   Patient Name:   Joshua Riley Date  of Exam: 11/10/2023 Medical Rec #:  [id number]       Height:       68.0 in Accession #:     [id number]      Weight:       151.7 lb Date of Birth:  1936-03-11       BSA:          1.817 m Patient Age:    87 years        BP:           89/59 mmHg Patient Gender: M               HR:           58 bpm. Exam Location:  Inpatient Procedure: 2D Echo, Color Doppler and Cardiac Doppler (Both Spectral and Color            Flow Doppler were utilized during procedure). Indications:    R07.9* Chest pain, unspecified  History:        Patient has prior history of Echocardiogram examinations, most                 recent 05/27/2022.  Sonographer:    Andrena Bang Referring Phys: CALLIE E GOODRICH IMPRESSIONS  1. Left ventricular ejection fraction, by estimation, is 55 to 60%. The left ventricle has normal function. The left ventricle has no regional wall motion abnormalities. Left ventricular diastolic parameters are consistent with Grade I diastolic dysfunction (impaired relaxation).  2. Right ventricular systolic function is normal. The right ventricular size is moderately enlarged.  3. The mitral valve is normal in structure. No evidence of mitral valve regurgitation. No evidence of mitral stenosis.  4. Tricuspid valve regurgitation is moderate.  5. The aortic valve is calcified. There is mild calcification of the aortic valve. There is mild thickening of the aortic valve. Aortic valve regurgitation is mild. Aortic valve sclerosis is present, with no evidence of aortic valve stenosis. Aortic valve mean gradient measures 4.0 mmHg. Aortic valve Vmax measures 1.46 m/s.  6. Aorta measured 40 mm on CT chest 11/09/2023. Aortic dilatation noted. There is mild dilatation of the ascending aorta, measuring 40 mm.  7. The inferior vena cava is normal in size with greater than 50% respiratory variability, suggesting right atrial pressure of 3 mmHg. FINDINGS  Left Ventricle: Left ventricular ejection fraction, by estimation, is 55 to 60%. The left ventricle has normal function. The left ventricle has no regional wall motion abnormalities.  Definity  contrast agent was given IV to delineate the left ventricular  endocardial borders. The left ventricular internal cavity size was normal in size. There is no left ventricular hypertrophy. Left ventricular diastolic parameters are consistent with Grade I diastolic dysfunction (impaired relaxation). Right Ventricle: The right ventricular size is moderately enlarged. No increase in right ventricular wall thickness. Right ventricular systolic function is normal. Left Atrium: Left atrial size was normal in size. Right Atrium: Right atrial size was normal in size. Pericardium: There is no evidence of pericardial effusion. Mitral Valve: The mitral valve is normal in structure. No evidence of mitral valve regurgitation. No evidence of mitral valve stenosis. MV peak gradient, 5.7 mmHg. The mean mitral valve gradient is 3.0 mmHg. Tricuspid Valve: The tricuspid valve is normal in structure. Tricuspid valve regurgitation is moderate . No evidence of tricuspid stenosis. Aortic Valve: The aortic valve is calcified. There is mild calcification of the aortic valve. There is mild thickening of the aortic valve. Aortic valve regurgitation is mild. Aortic valve  sclerosis is present, with no evidence of aortic valve stenosis. Aortic valve mean gradient measures 4.0 mmHg. Aortic valve peak gradient measures 8.5 mmHg. Aortic valve area, by VTI measures 2.72 cm. Pulmonic Valve: The pulmonic valve was normal in structure. Pulmonic valve regurgitation is trivial. No evidence of pulmonic stenosis. Aorta: Aorta measured 40 mm on CT chest 11/09/2023. Aortic dilatation noted. There is mild dilatation of the ascending aorta, measuring 40 mm. Venous: The inferior vena cava is normal in size with greater than 50% respiratory variability, suggesting right atrial pressure of 3 mmHg. IAS/Shunts: No atrial level shunt detected by color flow Doppler.  LEFT VENTRICLE PLAX 2D LVIDd:         4.60 cm      Diastology LVIDs:         3.00 cm      LV  e' medial:    7.29 cm/s LV PW:         1.40 cm      LV E/e' medial:  16.7 LV IVS:        0.70 cm      LV e' lateral:   9.03 cm/s LVOT diam:     2.20 cm      LV E/e' lateral: 13.5 LV SV:         89 LV SV Index:   49 LVOT Area:     3.80 cm  LV Volumes (MOD) LV vol d, MOD A2C: 119.0 ml LV vol d, MOD A4C: 151.0 ml LV vol s, MOD A2C: 38.3 ml LV vol s, MOD A4C: 61.0 ml LV SV MOD A2C:     80.7 ml LV SV MOD A4C:     151.0 ml LV SV MOD BP:      85.0 ml RIGHT VENTRICLE RV S prime:     10.90 cm/s TAPSE (M-mode): 1.9 cm LEFT ATRIUM           Index LA Vol (A2C): 91.9 ml 50.58 ml/m LA Vol (A4C): 85.2 ml 46.89 ml/m  AORTIC VALVE AV Area (Vmax):    2.92 cm AV Area (Vmean):   2.81 cm AV Area (VTI):     2.72 cm AV Vmax:           146.00 cm/s AV Vmean:          91.700 cm/s AV VTI:            0.328 m AV Peak Grad:      8.5 mmHg AV Mean Grad:      4.0 mmHg LVOT Vmax:         112.00 cm/s LVOT Vmean:        67.700 cm/s LVOT VTI:          0.235 m LVOT/AV VTI ratio: 0.72  AORTA Ao Asc diam: 4.60 cm MITRAL VALVE MV Area (PHT): 2.84 cm     SHUNTS MV Area VTI:   1.95 cm     Systemic VTI:  0.24 m MV Peak grad:  5.7 mmHg     Systemic Diam: 2.20 cm MV Mean grad:  3.0 mmHg MV Vmax:       1.19 m/s MV Vmean:      75.2 cm/s MV Decel Time: 267 msec MV E velocity: 122.00 cm/s MV A velocity: 116.00 cm/s MV E/A ratio:  1.05 Dorothye Gathers MD Electronically signed by Dorothye Gathers MD Signature Date/Time: 11/10/2023/1:22:42 PM    Final    US  Abdomen Limited RUQ (LIVER/GB) Result Date: 11/09/2023 CLINICAL DATA:  [id number]  Abdominal pain [id number] Cholelithiases [id number] EXAM: ULTRASOUND ABDOMEN LIMITED RIGHT UPPER QUADRANT COMPARISON:  None Available. FINDINGS: Gallbladder: Gallbladder wall thickened. Shadowing stone at the fundus. No pericholecystic fluid. Common bile duct: Diameter: 0.7 cm. Liver: Liver is hyperechoic consistent with fatty infiltration. No focal hepatic lesions identified. No intrahepatic ductal dilatation. Hepatopetal portal vein flow.  IMPRESSION: 1. Cholelithiasis. Gallbladder wall thickening. 2. Fatty infiltration of the liver. Electronically Signed   By: Sydell Eva M.D.   On: 11/09/2023 22:15   CT Angio Chest/Abd/Pel for Dissection W and/or Wo Contrast Result Date: 11/09/2023 CLINICAL DATA:  Acute aortic syndrome suspected. Chest pain and shortness of breath. EXAM: CT ANGIOGRAPHY CHEST, ABDOMEN AND PELVIS TECHNIQUE: Non-contrast CT of the chest was initially obtained. Multidetector CT imaging through the chest, abdomen and pelvis was performed using the standard protocol during bolus administration of intravenous contrast. Multiplanar reconstructed images and MIPs were obtained and reviewed to evaluate the vascular anatomy. RADIATION DOSE REDUCTION: This exam was performed according to the departmental dose-optimization program which includes automated exposure control, adjustment of the mA and/or kV according to patient size and/or use of iterative reconstruction technique. CONTRAST:  OMNIPAQUE  IOHEXOL  350 MG/ML SOLN COMPARISON:  Chest radiograph 11/08/2023; CT abdomen pelvis 09/06/2023; CT chest 08/18/2023 FINDINGS: CTA CHEST FINDINGS Cardiovascular: No intramural hematoma, penetrating atherosclerotic ulcer, or dissection. Normal caliber thoracic aorta. Aortic and coronary artery atherosclerotic calcification. Right subclavian vein stent. Mediastinum/Nodes: Trachea is patent. Unremarkable thyroid and esophagus. No lymphadenopathy. Lungs/Pleura: Lower lobe atelectasis and scarring. Otherwise no focal consolidation. No pleural effusion or pneumothorax. 7 mm right lower lobe nodule on series 7/image 103 is unchanged. Musculoskeletal: No acute fracture. Review of the MIP images confirms the above findings. CTA ABDOMEN AND PELVIS FINDINGS VASCULAR Aorta: Calcified atherosclerotic plaque throughout the abdominal aorta without hemodynamically significant stenosis. No aneurysm or dissection. Celiac: Severe narrowing at the origin  secondary to calcified atherosclerotic plaque. No aneurysm or dissection. SMA: Calcified plaque without hemodynamically significant stenosis. No aneurysm or dissection. Renals: Calcified plaque at the origin of the left renal artery. No hemodynamically significant stenosis. No aneurysm or dissection. IMA: Not visualized and likely occluded at the origin. Inflow: Redemonstrated surgical grafts arising from the left common femoral artery and vein extending inferiorly in the subcutaneous ventral left thigh. No hemodynamically significant stenosis. No aneurysm or dissection. Veins: No obvious acute venous abnormality within the limitations of this arterial phase study. Review of the MIP images confirms the above findings. NON-VASCULAR Hepatobiliary: Cholelithiasis. No evidence of acute cholecystitis. Unremarkable liver and biliary tree. Pancreas: Unremarkable. Spleen: Unremarkable. Adrenals/Urinary Tract: Normal adrenal glands. Atrophic native kidneys with multiple small hypoattenuating lesions statistically likely to represent cysts. No follow-up recommended. Percutaneous nephrostomy tube in the left kidney. No urinary calculi or hydronephrosis. Irregular asymmetric left wall thickening about the nondistended bladder. 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 Herniation of a nonobstructed loop of small bowel into the parastomal hernia. No bowel wall thickening or obstruction. Stomach is within normal limits. Lymphatic: No lymphadenopathy. Reproductive: No acute abnormality.  Penile prosthesis. Other: The presacral peripherally enhancing fluid collection has slightly increased in size compared to 09/06/2023 now measuring 4.7 x 5.5 cm in the axial plane, previously 3.7 x 5.5 cm. Extension of the fluid collection into the left ischial anal fossa. Mild adjacent stranding. No free intraperitoneal air. Musculoskeletal: No acute fracture. The presacral fluid collection abuts the  anterior cortex of the sacrum which demonstrates mild irregularity suspicious for osteomyelitis. Chronic L5 pars defects with  grade 1-2 anterolisthesis of L5. Review of the MIP images confirms the above findings. IMPRESSION: 1. No acute aortic syndrome. 2. The presacral fluid collection has slightly increased in size compared to 09/06/2023. Extension of the fluid collection into the left ischioanal fossa. The presacral fluid collection abuts the anterior cortex of the sacrum which demonstrates mild irregularity suspicious for osteomyelitis. 3. Irregular asymmetric left bladder wall thickening similar to prior. Cystitis versus malignancy. 4. Cholelithiasis. 5. Stable 7 mm right lower lobe nodule. Electronically Signed   By: Rozell Cornet M.D.   On: 11/09/2023 00:56   DG Chest 2 View Result Date: 11/08/2023 CLINICAL DATA:  Shortness of breath and chest pain EXAM: CHEST - 2 VIEW COMPARISON:  Radiograph 07/07/2023 FINDINGS: Stable cardiomediastinal silhouette. Calcified tortuous aorta. Low lung volumes accentuate pulmonary vascularity. Chronic bronchitic changes. Left basilar atelectasis. No pleural effusion or pneumothorax. Right subclavian vascular stent. IMPRESSION: Chronic bronchitic changes.  Left basilar atelectasis/scarring. Electronically Signed   By: Rozell Cornet M.D.   On: 11/08/2023 23:06    Labs:  CBC: Recent Labs    11/11/23 0005 11/11/23 0706 11/12/23 0900 11/13/23 0303  WBC 9.7 10.6* 9.5 9.0  HGB 8.4* 9.3* 8.6* 8.8*  HCT 24.9* 28.2* 26.3* 26.6*  PLT 204 204 206 202    COAGS: Recent Labs    07/13/23 0658 07/16/23 0630 11/09/23 1804 11/11/23 0005 11/11/23 0706 11/11/23 2041 11/12/23 0900  INR 1.1  --   --  1.3*  --   --   --   APTT 44*   < > 42*  --  35 35 64*   < > = values in this interval not displayed.    BMP: Recent Labs    11/10/23 1148 11/11/23 0706 11/12/23 0900 11/13/23 0303  NA 131* 133* 128* 131*  K 4.2 4.2 4.7 5.1  CL 93* 92* 93* 94*  CO2 19* 23  19* 26  GLUCOSE 102* 89 153* 109*  BUN 95* 49* 72* 44*  CALCIUM  9.1 9.1 8.7* 8.9  CREATININE 11.83* 7.34* 8.98* 5.62*  GFRNONAA 4* 7* 5* 9*    LIVER FUNCTION TESTS: Recent Labs    07/22/23 0450 07/23/23 0344 09/06/23 1808 09/07/23 0609 09/08/23 1948 11/09/23 1804 11/10/23 1148 11/12/23 0900 11/13/23 0303  BILITOT 0.9  --  0.6 1.0  --  1.1  --   --   --   AST 39  --  10* 16  --  16  --   --   --   ALT 13  --  10 15  --  32  --   --   --   ALKPHOS 116  --  115 96  --  77  --   --   --   PROT 5.9*  --  7.7 7.1  --  6.5  --   --   --   ALBUMIN  3.1*   < > 4.9 3.7   < > 2.6* 2.9* 2.8* 2.7*   < > = values in this interval not displayed.    TUMOR MARKERS: No results for input(s): "AFPTM", "CEA", "CA199", "CHROMGRNA" in the last 8760 hours.  Assessment: Pelvic fluid collection   CT abdomen/pelvis performed 11/25/23 and reviewed by Dr. Wilnette Haste. Improved fluid collection. After discussion with Dr. Wilnette Haste, drain will stay in place at this time due to the chronic nature of the fluid collection and prior need for drain placement.  Follow up with general surgery to discuss any possible surgical options. Referral placed.  New prescription for flushes to [facility]. Continue drain. New bandage and stat lock placed.  Reinforced drain care. Patient encouraged to empty once daily and record output and color. Patient informed to put the bulb to suction after emptying the drain. Patient also informed to change bandage every few days or sooner if needed.  Follow up in outpatient clinic and with CT in 3-4 weeks.  Contact IR with questions or concerns.   Electronically Signed: Lawrance Presume PA-C 11/25/2023, 10:48 AM   Please refer to Dr. Wilnette Haste attestation of this note for management and plan.

## 2023-11-26 ENCOUNTER — Encounter: Payer: Self-pay | Admitting: *Deleted

## 2023-11-26 ENCOUNTER — Other Ambulatory Visit (HOSPITAL_COMMUNITY): Payer: Self-pay

## 2023-11-26 ENCOUNTER — Ambulatory Visit (INDEPENDENT_AMBULATORY_CARE_PROVIDER_SITE_OTHER): Admitting: Podiatry

## 2023-11-26 ENCOUNTER — Encounter: Payer: Self-pay | Admitting: Podiatry

## 2023-11-26 VITALS — Ht 68.0 in | Wt 155.0 lb

## 2023-11-26 DIAGNOSIS — D689 Coagulation defect, unspecified: Secondary | ICD-10-CM

## 2023-11-26 DIAGNOSIS — L84 Corns and callosities: Secondary | ICD-10-CM

## 2023-11-27 ENCOUNTER — Emergency Department (HOSPITAL_COMMUNITY)

## 2023-11-27 ENCOUNTER — Inpatient Hospital Stay (HOSPITAL_COMMUNITY)
Admission: EM | Admit: 2023-11-27 | Discharge: 2023-12-03 | DRG: 312 | Disposition: A | Discharge status: Skilled Nursing Facility | Attending: Family Medicine | Admitting: Family Medicine

## 2023-11-27 ENCOUNTER — Other Ambulatory Visit: Payer: Self-pay

## 2023-11-27 ENCOUNTER — Other Ambulatory Visit: Payer: Self-pay | Admitting: Internal Medicine

## 2023-11-27 ENCOUNTER — Encounter (HOSPITAL_COMMUNITY): Payer: Self-pay

## 2023-11-27 ENCOUNTER — Telehealth: Payer: Self-pay

## 2023-11-27 DIAGNOSIS — R55 Syncope and collapse: Secondary | ICD-10-CM | POA: Diagnosis present

## 2023-11-27 DIAGNOSIS — I480 Paroxysmal atrial fibrillation: Secondary | ICD-10-CM | POA: Diagnosis present

## 2023-11-27 DIAGNOSIS — R188 Other ascites: Secondary | ICD-10-CM

## 2023-11-27 DIAGNOSIS — N2581 Secondary hyperparathyroidism of renal origin: Secondary | ICD-10-CM | POA: Diagnosis present

## 2023-11-27 DIAGNOSIS — Z8673 Personal history of transient ischemic attack (TIA), and cerebral infarction without residual deficits: Secondary | ICD-10-CM

## 2023-11-27 DIAGNOSIS — I309 Acute pericarditis, unspecified: Secondary | ICD-10-CM | POA: Diagnosis present

## 2023-11-27 DIAGNOSIS — Z7951 Long term (current) use of inhaled steroids: Secondary | ICD-10-CM

## 2023-11-27 DIAGNOSIS — Z933 Colostomy status: Secondary | ICD-10-CM

## 2023-11-27 DIAGNOSIS — Z7901 Long term (current) use of anticoagulants: Secondary | ICD-10-CM

## 2023-11-27 DIAGNOSIS — K611 Rectal abscess: Secondary | ICD-10-CM | POA: Diagnosis present

## 2023-11-27 DIAGNOSIS — Z936 Other artificial openings of urinary tract status: Secondary | ICD-10-CM

## 2023-11-27 DIAGNOSIS — F32A Depression, unspecified: Secondary | ICD-10-CM | POA: Diagnosis present

## 2023-11-27 DIAGNOSIS — Z87891 Personal history of nicotine dependence: Secondary | ICD-10-CM

## 2023-11-27 DIAGNOSIS — Z1152 Encounter for screening for COVID-19: Secondary | ICD-10-CM

## 2023-11-27 DIAGNOSIS — K5190 Ulcerative colitis, unspecified, without complications: Secondary | ICD-10-CM | POA: Diagnosis present

## 2023-11-27 DIAGNOSIS — Z79899 Other long term (current) drug therapy: Secondary | ICD-10-CM

## 2023-11-27 DIAGNOSIS — I953 Hypotension of hemodialysis: Secondary | ICD-10-CM | POA: Diagnosis not present

## 2023-11-27 DIAGNOSIS — E872 Acidosis, unspecified: Secondary | ICD-10-CM

## 2023-11-27 DIAGNOSIS — Z823 Family history of stroke: Secondary | ICD-10-CM

## 2023-11-27 DIAGNOSIS — I951 Orthostatic hypotension: Principal | ICD-10-CM | POA: Diagnosis present

## 2023-11-27 DIAGNOSIS — T85628A Displacement of other specified internal prosthetic devices, implants and grafts, initial encounter: Secondary | ICD-10-CM | POA: Diagnosis not present

## 2023-11-27 DIAGNOSIS — I120 Hypertensive chronic kidney disease with stage 5 chronic kidney disease or end stage renal disease: Secondary | ICD-10-CM | POA: Diagnosis present

## 2023-11-27 DIAGNOSIS — M4628 Osteomyelitis of vertebra, sacral and sacrococcygeal region: Secondary | ICD-10-CM | POA: Diagnosis present

## 2023-11-27 DIAGNOSIS — Z888 Allergy status to other drugs, medicaments and biological substances status: Secondary | ICD-10-CM

## 2023-11-27 DIAGNOSIS — Z66 Do not resuscitate: Secondary | ICD-10-CM | POA: Diagnosis not present

## 2023-11-27 DIAGNOSIS — E860 Dehydration: Secondary | ICD-10-CM | POA: Diagnosis present

## 2023-11-27 DIAGNOSIS — Z800 Family history of malignant neoplasm of digestive organs: Secondary | ICD-10-CM

## 2023-11-27 DIAGNOSIS — M1990 Unspecified osteoarthritis, unspecified site: Secondary | ICD-10-CM | POA: Diagnosis present

## 2023-11-27 DIAGNOSIS — Y838 Other surgical procedures as the cause of abnormal reaction of the patient, or of later complication, without mention of misadventure at the time of the procedure: Secondary | ICD-10-CM | POA: Diagnosis not present

## 2023-11-27 DIAGNOSIS — Z992 Dependence on renal dialysis: Secondary | ICD-10-CM

## 2023-11-27 DIAGNOSIS — E785 Hyperlipidemia, unspecified: Secondary | ICD-10-CM | POA: Diagnosis present

## 2023-11-27 DIAGNOSIS — G609 Hereditary and idiopathic neuropathy, unspecified: Secondary | ICD-10-CM | POA: Diagnosis present

## 2023-11-27 DIAGNOSIS — I4510 Unspecified right bundle-branch block: Secondary | ICD-10-CM | POA: Diagnosis present

## 2023-11-27 DIAGNOSIS — H409 Unspecified glaucoma: Secondary | ICD-10-CM | POA: Diagnosis present

## 2023-11-27 DIAGNOSIS — I959 Hypotension, unspecified: Principal | ICD-10-CM

## 2023-11-27 DIAGNOSIS — I4891 Unspecified atrial fibrillation: Secondary | ICD-10-CM | POA: Insufficient documentation

## 2023-11-27 DIAGNOSIS — I679 Cerebrovascular disease, unspecified: Secondary | ICD-10-CM | POA: Insufficient documentation

## 2023-11-27 DIAGNOSIS — G4730 Sleep apnea, unspecified: Secondary | ICD-10-CM | POA: Diagnosis present

## 2023-11-27 DIAGNOSIS — Z86718 Personal history of other venous thrombosis and embolism: Secondary | ICD-10-CM

## 2023-11-27 DIAGNOSIS — Z85828 Personal history of other malignant neoplasm of skin: Secondary | ICD-10-CM

## 2023-11-27 DIAGNOSIS — Z881 Allergy status to other antibiotic agents status: Secondary | ICD-10-CM

## 2023-11-27 DIAGNOSIS — R569 Unspecified convulsions: Secondary | ICD-10-CM | POA: Diagnosis present

## 2023-11-27 DIAGNOSIS — Z9049 Acquired absence of other specified parts of digestive tract: Secondary | ICD-10-CM

## 2023-11-27 DIAGNOSIS — D631 Anemia in chronic kidney disease: Secondary | ICD-10-CM | POA: Diagnosis present

## 2023-11-27 DIAGNOSIS — D539 Nutritional anemia, unspecified: Secondary | ICD-10-CM | POA: Diagnosis present

## 2023-11-27 DIAGNOSIS — Z8616 Personal history of COVID-19: Secondary | ICD-10-CM

## 2023-11-27 DIAGNOSIS — N186 End stage renal disease: Secondary | ICD-10-CM | POA: Diagnosis present

## 2023-11-27 LAB — I-STAT CG4 LACTIC ACID, ED
Lactic Acid, Venous: 2 mmol/L (ref 0.5–1.9)
Lactic Acid, Venous: 2.1 mmol/L (ref 0.5–1.9)
Lactic Acid, Venous: 1.4 mmol/L (ref 0.5–1.9)
Lactic Acid, Venous: 2.3 mmol/L (ref 0.5–1.9)

## 2023-11-27 LAB — CBC WITH DIFFERENTIAL/PLATELET
Abs Immature Granulocytes: 0 10*3/uL (ref 0.00–0.07)
Basophils Absolute: 0 10*3/uL (ref 0.0–0.1)
Basophils Relative: 0 %
Eosinophils Absolute: 0.3 10*3/uL (ref 0.0–0.5)
Eosinophils Relative: 4 %
HCT: 25.2 % — ABNORMAL LOW (ref 39.0–52.0)
Hemoglobin: 8 g/dL — ABNORMAL LOW (ref 13.0–17.0)
Lymphocytes Relative: 5 %
Lymphs Abs: 0.4 10*3/uL — ABNORMAL LOW (ref 0.7–4.0)
MCH: 38.5 pg — ABNORMAL HIGH (ref 26.0–34.0)
MCHC: 31.7 g/dL (ref 30.0–36.0)
MCV: 121.2 fL — ABNORMAL HIGH (ref 80.0–100.0)
Monocytes Absolute: 0.3 10*3/uL (ref 0.1–1.0)
Monocytes Relative: 4 %
Neutro Abs: 6.9 10*3/uL (ref 1.7–7.7)
Neutrophils Relative %: 87 %
Platelets: 171 10*3/uL (ref 150–400)
RBC: 2.08 MIL/uL — ABNORMAL LOW (ref 4.22–5.81)
RDW: 17.2 % — ABNORMAL HIGH (ref 11.5–15.5)
WBC: 7.9 10*3/uL (ref 4.0–10.5)
nRBC: 0 % (ref 0.0–0.2)
nRBC: 0 /100{WBCs}

## 2023-11-27 LAB — COMPREHENSIVE METABOLIC PANEL WITH GFR
ALT: 15 U/L (ref 0–44)
AST: 25 U/L (ref 15–41)
Albumin: 3.3 g/dL — ABNORMAL LOW (ref 3.5–5.0)
Alkaline Phosphatase: 69 U/L (ref 38–126)
Anion gap: 12 (ref 5–15)
BUN: 14 mg/dL (ref 8–23)
CO2: 30 mmol/L (ref 22–32)
Calcium: 8.8 mg/dL — ABNORMAL LOW (ref 8.9–10.3)
Chloride: 99 mmol/L (ref 98–111)
Creatinine, Ser: 4.15 mg/dL — ABNORMAL HIGH (ref 0.61–1.24)
GFR, Estimated: 13 mL/min — ABNORMAL LOW (ref >60)
Glucose, Bld: 108 mg/dL — ABNORMAL HIGH (ref 70–99)
Potassium: 3.6 mmol/L (ref 3.5–5.1)
Sodium: 141 mmol/L (ref 135–145)
Total Bilirubin: 0.8 mg/dL (ref 0.0–1.2)
Total Protein: 6.6 g/dL (ref 6.5–8.1)

## 2023-11-27 LAB — RESP PANEL BY RT-PCR (RSV, FLU A&B, COVID)  RVPGX2
Influenza A by PCR: NEGATIVE
Influenza B by PCR: NEGATIVE
Resp Syncytial Virus by PCR: NEGATIVE
SARS Coronavirus 2 by RT PCR: NEGATIVE

## 2023-11-27 LAB — PROTIME-INR
INR: 1.1 (ref 0.8–1.2)
Prothrombin Time: 14.4 s (ref 11.4–15.2)

## 2023-11-27 LAB — VANCOMYCIN, RANDOM: Vancomycin Rm: 28 ug/mL

## 2023-11-27 MED ORDER — LACTATED RINGERS IV SOLN: INTRAVENOUS | Status: DC

## 2023-11-27 MED ORDER — SODIUM CHLORIDE 0.9 % IV SOLN
1.0000 g | Freq: Every day | INTRAVENOUS | Status: DC
  Filled 2023-11-27: qty 20

## 2023-11-27 MED ORDER — SODIUM CHLORIDE 0.9 % IV BOLUS (SEPSIS)
250.0000 mL | Freq: Once | INTRAVENOUS | Status: AC
  Administered 2023-11-27: 250 mL via INTRAVENOUS

## 2023-11-27 MED ORDER — IOHEXOL 350 MG/ML SOLN
75.0000 mL | Freq: Once | INTRAVENOUS | Status: AC | PRN
  Administered 2023-11-27: 75 mL via INTRAVENOUS

## 2023-11-27 MED ORDER — SODIUM CHLORIDE 0.9 % IV BOLUS
250.0000 mL | Freq: Once | INTRAVENOUS | Status: AC
  Administered 2023-11-27: 250 mL via INTRAVENOUS

## 2023-11-27 MED ORDER — SODIUM CHLORIDE 0.9 % IV BOLUS
500.0000 mL | Freq: Once | INTRAVENOUS | Status: AC
  Administered 2023-11-27: 500 mL via INTRAVENOUS

## 2023-11-27 MED ORDER — MIDODRINE HCL 5 MG PO TABS
5.0000 mg | ORAL_TABLET | Freq: Three times a day (TID) | ORAL | Status: DC
  Administered 2023-11-27 (×2): 5 mg via ORAL
  Filled 2023-11-27 (×2): qty 1

## 2023-11-27 MED ORDER — SODIUM CHLORIDE 0.9 % IV SOLN
1.0000 g | Freq: Every day | INTRAVENOUS | Status: AC
  Administered 2023-11-28 – 2023-12-02 (×5): 1 g via INTRAVENOUS
  Filled 2023-11-27 (×5): qty 20

## 2023-11-27 MED ORDER — VANCOMYCIN HCL 750 MG/150ML IV SOLN
750.0000 mg | INTRAVENOUS | Status: AC
  Administered 2023-11-30 – 2023-12-02 (×2): 750 mg via INTRAVENOUS
  Filled 2023-11-27: qty 150

## 2023-11-27 NOTE — ED Notes (Signed)
 Patient transported to CT

## 2023-11-27 NOTE — ED Provider Notes (Signed)
 [place] EMERGENCY DEPARTMENT AT [facility] Provider Note   CSN: [id number] Arrival date & time: 11/27/23  1332     History  Chief Complaint  Patient presents with   Syncopal Event   Hypotension    Joshua Riley is a 88 y.o. male.  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  The patient had a syncopal episode this afternoon.  He states that he got lightheaded and was told that he lost consciousness.  Denies any injuries from this.  He did have a drain placed 2 weeks ago for a pelvic infection.  He is currently receiving antibiotics with dialysis.  He is on midodrine .  States that he has been taking this as prescribed.        Home Medications Prior to Admission medications   Medication Sig Start Date End Date Taking? Authorizing Provider  ertapenem  (INVANZ ) IVPB Inject 1 g into the vein every Monday, Wednesday, and Friday with hemodialysis for 19 days. **To be given at dialysis** Indication: Sacral osteomyelitis  First Dose: Yes  Last Day of Therapy:  5/28 Labs - Once weekly:  CBC/D and BMP, Labs - Once weekly: ESR and CRP Method of administration: Mini-Bag Plus / Gravity Method of administration may be changed at the discretion of home infusion pharmacist based upon assessment of the patient and/or caregiver's ability to self-administer the medication ordered. 11/13/23 12/02/23 Yes Armenta Landau, MD  vancomycin  IVPB Inject 750 mg into the vein every Monday, Wednesday, and Friday with hemodialysis for 19 days. **To be given at dialysis** Indication: Sacral osteomyelitis  First Dose: Yes  Last Day of Therapy:  5/28 Labs - Once weekly:  CBC/D and BMP, Labs - Once weekly: ESR and CRP Method of administration: Mini-Bag Plus / Gravity Method of administration may be changed at the discretion of home infusion pharmacist based upon  assessment of the patient and/or caregiver's ability to self-administer the medication ordered. 11/13/23 12/02/23 Yes Armenta Landau, MD  albuterol  (PROVENTIL ) (2.5 MG/3ML) 0.083% nebulizer solution Take 2.5 mg by nebulization every 6 (six) hours as needed for wheezing or shortness of breath.    [provider]  albuterol  (VENTOLIN  HFA) 108 (90 Base) MCG/ACT inhaler Inhale 1-2 puffs into the lungs every 6 (six) hours as needed for wheezing or shortness of breath.    [provider]  atorvastatin  (LIPITOR) 20 MG tablet Take 1 tablet (20 mg total) by mouth daily. 11/14/23   Armenta Landau, MD  budesonide  (PULMICORT ) 0.5 MG/2ML nebulizer solution Take 0.5 mg by nebulization 2 (two) times daily as needed (for respiratory flares).    [provider]  Cyanocobalamin (VITAMIN B-12 PO) Take 1 capsule by mouth in the morning and at bedtime.    [provider]  dorzolamide  (TRUSOPT ) 2 % ophthalmic solution Place 1 drop into the right eye at bedtime. 06/17/23   [provider]  ELIQUIS  2.5 MG TABS tablet Take 1 tablet (2.5 mg total) by mouth 2 (two) times daily. 08/07/22   Sonny Dust, MD  ertapenem  1 g in sodium chloride  0.9 % 100 mL Inject 1 g into the vein daily for 19 days. 11/13/23 12/02/23  Armenta Landau, MD  folic acid  (FOLVITE ) 1 MG tablet Take 1 tablet (1 mg total) by mouth daily. 07/21/23   Regalado, Belkys A, MD  gabapentin  (NEURONTIN ) 300 MG capsule Take 300 mg by mouth at  bedtime. 09/09/23   [provider]  loperamide  (IMODIUM ) 2 MG capsule Take 1 capsule (2 mg total) by mouth as needed for diarrhea or loose stools. 11/13/23   Armenta Landau, MD  midodrine  (PROAMATINE ) 5 MG tablet Take 3 tablets (15 mg total) by mouth 3 (three) times daily. 11/13/23   Armenta Landau, MD  mometasone -formoterol  (DULERA ) 200-5 MCG/ACT AERO Inhale 2 puffs into the lungs 2 (two) times daily. 09/08/23   Doroteo Gasmen, MD  nitroGLYCERIN  (NITROSTAT ) 0.4  MG SL tablet Place 1 tablet (0.4 mg total) under the tongue every 5 (five) minutes as needed for chest pain. 11/13/23   Armenta Landau, MD  Omega Fatty Acids-Vitamins (OMEGA-3 GUMMIES) CHEW Chew 1 tablet by mouth in the morning and at bedtime.    [provider]  pantoprazole  (PROTONIX ) 40 MG tablet Take 1 tablet (40 mg total) by mouth 2 (two) times daily. 11/13/23   Armenta Landau, MD  ramelteon  (ROZEREM ) 8 MG tablet Take 1 tablet (8 mg total) by mouth at bedtime. Patient taking differently: Take 8 mg by mouth at bedtime as needed for sleep. 10/27/23   Almira Jaeger, MD  sertraline  (ZOLOFT ) 50 MG tablet Take 25 mg by mouth at bedtime.    [provider]  sevelamer  carbonate (RENVELA ) 800 MG tablet Take 1,600 mg by mouth 3 (three) times daily with meals.    [provider]  Sodium Chloride  Flush (SALINE FLUSH) 0.9 % SOLN Use 5 mLs by Intracatheter route daily as directed. 11/25/23   Caperilla, Marissa N, PA  timolol  (TIMOPTIC ) 0.5 % ophthalmic solution Place 1 drop into both eyes in the morning and at bedtime.    [provider]  traMADol  (ULTRAM ) 50 MG tablet Take 1 tablet (50 mg total) by mouth every 8 (eight) hours as needed (for pain). 11/13/23   Armenta Landau, MD  TYLENOL  325 MG tablet Take 325-650 mg by mouth every 6 (six) hours as needed for mild pain (pain score 1-3) or headache.    [provider]      Allergies    Cephalosporins, Ciprofloxacin, and Baclofen    Review of Systems   Review of Systems  Neurological:  Positive for syncope.  All other systems reviewed and are negative.   Physical Exam Updated Vital Signs BP (!) 94/49   Pulse 82   Temp 98.3 F (36.8 C) (Oral)   Resp 18   SpO2 100%  Physical Exam Vitals and nursing note reviewed.   Gen: NAD, chronically ill-appearing Eyes: PERRL, EOMI HEENT: no oropharyngeal swelling Neck: trachea midline Resp: clear to auscultation bilaterally Card: RRR, no murmurs, rubs,  or gallops Abd: nontender, nondistended, pelvic drain noted with some purulent drainage noted in the tubing Extremities: no calf tenderness, no edema Vascular: 2+ radial pulses bilaterally, 2+ DP pulses bilaterally Skin: no rashes Psyc: acting appropriately   ED Results / Procedures / Treatments   Labs (all labs ordered are listed, but only abnormal results are displayed) Labs Reviewed  CBC WITH DIFFERENTIAL/PLATELET - Abnormal; Notable for the following components:      Result Value   RBC 2.08 (*)    Hemoglobin 8.0 (*)    HCT 25.2 (*)    MCV 121.2 (*)    MCH 38.5 (*)    RDW 17.2 (*)    Lymphs Abs 0.4 (*)    All other components within normal limits  I-STAT CG4 LACTIC ACID, ED - Abnormal; Notable for the following components:  Lactic Acid, Venous 2.0 (*)    All other components within normal limits  RESP PANEL BY RT-PCR (RSV, FLU A&B, COVID)  RVPGX2  CULTURE, BLOOD (ROUTINE X 2)  CULTURE, BLOOD (ROUTINE X 2)  PROTIME-INR  COMPREHENSIVE METABOLIC PANEL WITH GFR  URINALYSIS, W/ REFLEX TO CULTURE (INFECTION SUSPECTED)  VANCOMYCIN , RANDOM    EKG EKG Interpretation Date/Time:  Friday Nov 27 2023 13:42:50 EDT Ventricular Rate:  72 PR Interval:  320 QRS Duration:  153 QT Interval:  448 QTC Calculation: 491 R Axis:   72  Text Interpretation: Sinus rhythm Atrial premature complexes Prolonged PR interval Right bundle branch block Confirmed by Abner Hoffman [id number]) on 11/27/2023 1:48:12 PM  Radiology No results found.  Procedures Procedures    Medications Ordered in ED Medications  lactated ringers  infusion ( Intravenous New Bag/Given 11/27/23 1436)  sodium chloride  0.9 % bolus 250 mL (250 mLs Intravenous New Bag/Given 11/27/23 1434)  midodrine  (PROAMATINE ) tablet 5 mg (5 mg Oral Given 11/27/23 1434)  meropenem  (MERREM ) 1 g in sodium chloride  0.9 % 100 mL IVPB (has no administration in time range)    ED Course/ Medical Decision Making/ A&P                                  Medical Decision Making 88 year old male with past medical history of end-stage renal disease presenting to the emergency department today with syncope and hypotension.  Will initiate a sepsis workup on the patient.  I will give the patient a 250 mL fluid bolus.  Will hold off on full sepsis fluids with the patient being end-stage renal.  I will give the patient midodrine  here as well since he is on this at home.  Will obtain a CT scan of his pelvis to evaluate for recurrent or worsening fluid collection.  He will require admission.  The patient's EKG interpreted by me shows no acute changes compared to his previous EKG.  Initial CBC is unremarkable.  Blood pressure is improving with the small fluid bolus and midodrine .  Plan is for admission after his workup is complete.  Signed out to oncoming provider.  CRITICAL CARE Performed by: Carin Charleston   Total critical care time: 32 minutes  Critical care time was exclusive of separately billable procedures and treating other patients.  Critical care was necessary to treat or prevent imminent or life-threatening deterioration.  Critical care was time spent personally by me on the following activities: development of treatment plan with patient and/or surrogate as well as nursing, discussions with consultants, evaluation of patient's response to treatment, examination of patient, obtaining history from patient or surrogate, ordering and performing treatments and interventions, ordering and review of laboratory studies, ordering and review of radiographic studies, pulse oximetry and re-evaluation of patient's condition.   Amount and/or Complexity of Data Reviewed Labs: ordered. Radiology: ordered.  Risk Prescription drug management.           Final Clinical Impression(s) / ED Diagnoses Final diagnoses:  Hypotension, unspecified hypotension type  Syncope, unspecified syncope type    Rx / DC Orders ED Discharge Orders     None          Carin Charleston, MD 11/27/23 [phone]

## 2023-11-27 NOTE — Progress Notes (Addendum)
 Pharmacy Antibiotic Note  Joshua Riley is a 88 y.o. male admitted on 11/27/2023 with sepsis.  Pharmacy has been consulted for meropenem  and vancomycin  dosing. Patient presenting following syncopal episode with HD - pt with recent admission complicated by abscess that grew out ESBL E.Coli, Prevotella, and Enterococcus faecalis. Pt discharged on IV ertapenem  and vancomycin  with HD, last doses 5/23, tenative planned end date of 5/28 per ID. Discussed with ID PharmD- recommended to continue carbapenem and vancomycin  regimen, will start vancomycin  and meropenem .   Plan: Vancomycin  random post HD therapeutic at 28, potentially falsely elevated post-HD.Aaron Aas Patient receiving 750mg  w/ HD - unclear HD schedule while inpatent, will place MWF but f/u to ensure correct. Consider getting random pre-HD level to determine need to decrease maintenance dose.  Last dose of ertapenem  today with HD- will start meropenem  1g q24h 5/24 AM. Follow culture data for de-escalation.  Monitor renal function for dose adjustments as indicated.     Temp (24hrs), Avg:98.3 F (36.8 C), Min:98.3 F (36.8 C), Max:98.3 F (36.8 C)  Recent Labs  Lab 11/27/23 1412 11/27/23 1448 11/27/23 1641  WBC 7.9  --   --   CREATININE 4.15*  --   --   LATICACIDVEN  --  2.0* 2.1*  VANCORANDOM 28  --   --     Estimated Creatinine Clearance: 12.6 mL/min (A) (by C-G formula based on SCr of 4.15 mg/dL (H)).    Allergies  Allergen Reactions   Baclofen Other (See Comments)    Altered mental status, after accidental overdose     Cephalosporins Rash   Ciprofloxacin Itching and Rash    Antimicrobials this admission: Meroepenem 5/5 >> 5/9 Ertapenem  5/9 >> 5/23 Vancomycin  5/5 >>   Microbiology results: 5/23 BCx:   Thank you for allowing pharmacy to be a part of this patient's care.  Mamie Searles, PharmD, BCCCP  11/27/2023 5:59 PM

## 2023-11-27 NOTE — ED Triage Notes (Signed)
 Pt BIB [facility] from home d/t a syncopal episode witnessed by PT & home health RN. Pt was reported to have returned from dialysis around 1000 & during PT had sudden onset dizziness/lightheaded & had a syncopal episode. EMS reports initial SBP in the 80's & was 86/40 upon arrival. 12L unremarkable, CBG 178, 95% on RA. Does have some intermittent confusion (per EMS) A/Ox4, upon arrival to ED. Does not remember the event. Hx of Pericarditis & sepsis. Does have a known Lt found wound that has been dressed by his [facility] RN.

## 2023-11-27 NOTE — Sepsis Progress Note (Signed)
 eLink is following this Code Sepsis.

## 2023-11-27 NOTE — Sepsis Progress Note (Signed)
 Notified provider of need to order another repeat lactic acid as the second came back higher than the first.

## 2023-11-27 NOTE — ED Notes (Signed)
 EDP  Young messaged regarding pt BP.

## 2023-11-27 NOTE — ED Notes (Signed)
 Daughter, Mollie Anger, updated on patient's status.

## 2023-11-27 NOTE — ED Notes (Signed)
 Patient states that blood pressure usually runs in the 80-90s systolic.

## 2023-11-27 NOTE — Telephone Encounter (Signed)
 Patient's daughter called office stating they are not able to keep appointment due to conflict with dialysis schedule (M<W<F @ 6-10). Pts end date for IV antbx is 5/28. No open appts with provider. Will forward message to Dr. Zelda Hickman to advise on what patient should do regarding antbx @ HD.  Currently scheduled for [facility] with Dr. Ernie Heal on 5/28 Joshua Riley, [place]

## 2023-11-27 NOTE — ED Notes (Signed)
 Patient bladder scanned x3. All three resulting in 0mL.

## 2023-11-27 NOTE — Sepsis Progress Note (Addendum)
 Noted that patient has received IV antibiotic care by [facility] as seen in PTA meds. Message sent to pharmacist to inquire if this is the reason for the change in timing of antibiotics while in ED.  1815 Confirmed with Mamie Searles [facility] that pt had received antibiotics in HD prior to going to ED.

## 2023-11-28 ENCOUNTER — Observation Stay (HOSPITAL_COMMUNITY)

## 2023-11-28 ENCOUNTER — Encounter (HOSPITAL_COMMUNITY): Payer: Self-pay | Admitting: Internal Medicine

## 2023-11-28 DIAGNOSIS — Z515 Encounter for palliative care: Secondary | ICD-10-CM | POA: Diagnosis not present

## 2023-11-28 DIAGNOSIS — E872 Acidosis, unspecified: Secondary | ICD-10-CM

## 2023-11-28 DIAGNOSIS — Y838 Other surgical procedures as the cause of abnormal reaction of the patient, or of later complication, without mention of misadventure at the time of the procedure: Secondary | ICD-10-CM | POA: Diagnosis not present

## 2023-11-28 DIAGNOSIS — I953 Hypotension of hemodialysis: Secondary | ICD-10-CM | POA: Diagnosis not present

## 2023-11-28 DIAGNOSIS — T85628A Displacement of other specified internal prosthetic devices, implants and grafts, initial encounter: Secondary | ICD-10-CM | POA: Diagnosis not present

## 2023-11-28 DIAGNOSIS — I951 Orthostatic hypotension: Secondary | ICD-10-CM | POA: Diagnosis present

## 2023-11-28 DIAGNOSIS — Z8616 Personal history of COVID-19: Secondary | ICD-10-CM | POA: Diagnosis not present

## 2023-11-28 DIAGNOSIS — K5190 Ulcerative colitis, unspecified, without complications: Secondary | ICD-10-CM | POA: Diagnosis present

## 2023-11-28 DIAGNOSIS — Z933 Colostomy status: Secondary | ICD-10-CM | POA: Diagnosis not present

## 2023-11-28 DIAGNOSIS — K651 Peritoneal abscess: Secondary | ICD-10-CM | POA: Diagnosis not present

## 2023-11-28 DIAGNOSIS — I4891 Unspecified atrial fibrillation: Secondary | ICD-10-CM | POA: Diagnosis not present

## 2023-11-28 DIAGNOSIS — N2581 Secondary hyperparathyroidism of renal origin: Secondary | ICD-10-CM | POA: Diagnosis present

## 2023-11-28 DIAGNOSIS — Z79899 Other long term (current) drug therapy: Secondary | ICD-10-CM | POA: Diagnosis not present

## 2023-11-28 DIAGNOSIS — F32A Depression, unspecified: Secondary | ICD-10-CM | POA: Diagnosis present

## 2023-11-28 DIAGNOSIS — Z7901 Long term (current) use of anticoagulants: Secondary | ICD-10-CM | POA: Diagnosis not present

## 2023-11-28 DIAGNOSIS — Z66 Do not resuscitate: Secondary | ICD-10-CM | POA: Diagnosis not present

## 2023-11-28 DIAGNOSIS — R55 Syncope and collapse: Secondary | ICD-10-CM | POA: Diagnosis present

## 2023-11-28 DIAGNOSIS — M4628 Osteomyelitis of vertebra, sacral and sacrococcygeal region: Secondary | ICD-10-CM | POA: Diagnosis present

## 2023-11-28 DIAGNOSIS — N186 End stage renal disease: Secondary | ICD-10-CM | POA: Diagnosis present

## 2023-11-28 DIAGNOSIS — E860 Dehydration: Secondary | ICD-10-CM | POA: Diagnosis present

## 2023-11-28 DIAGNOSIS — B9620 Unspecified Escherichia coli [E. coli] as the cause of diseases classified elsewhere: Secondary | ICD-10-CM | POA: Diagnosis not present

## 2023-11-28 DIAGNOSIS — I120 Hypertensive chronic kidney disease with stage 5 chronic kidney disease or end stage renal disease: Secondary | ICD-10-CM | POA: Diagnosis present

## 2023-11-28 DIAGNOSIS — I480 Paroxysmal atrial fibrillation: Secondary | ICD-10-CM | POA: Diagnosis present

## 2023-11-28 DIAGNOSIS — I959 Hypotension, unspecified: Secondary | ICD-10-CM | POA: Diagnosis not present

## 2023-11-28 DIAGNOSIS — E785 Hyperlipidemia, unspecified: Secondary | ICD-10-CM | POA: Diagnosis present

## 2023-11-28 DIAGNOSIS — Z1612 Extended spectrum beta lactamase (ESBL) resistance: Secondary | ICD-10-CM | POA: Diagnosis not present

## 2023-11-28 DIAGNOSIS — R569 Unspecified convulsions: Secondary | ICD-10-CM | POA: Diagnosis present

## 2023-11-28 DIAGNOSIS — Z7951 Long term (current) use of inhaled steroids: Secondary | ICD-10-CM | POA: Diagnosis not present

## 2023-11-28 DIAGNOSIS — Z85828 Personal history of other malignant neoplasm of skin: Secondary | ICD-10-CM | POA: Diagnosis not present

## 2023-11-28 DIAGNOSIS — Z1152 Encounter for screening for COVID-19: Secondary | ICD-10-CM | POA: Diagnosis not present

## 2023-11-28 DIAGNOSIS — D631 Anemia in chronic kidney disease: Secondary | ICD-10-CM | POA: Diagnosis present

## 2023-11-28 DIAGNOSIS — Z992 Dependence on renal dialysis: Secondary | ICD-10-CM | POA: Diagnosis not present

## 2023-11-28 HISTORY — DX: Acidosis, unspecified: E87.20

## 2023-11-28 LAB — BASIC METABOLIC PANEL WITH GFR
Anion gap: 16 — ABNORMAL HIGH (ref 5–15)
BUN: 16 mg/dL (ref 8–23)
CO2: 24 mmol/L (ref 22–32)
Calcium: 8.7 mg/dL — ABNORMAL LOW (ref 8.9–10.3)
Chloride: 100 mmol/L (ref 98–111)
Creatinine, Ser: 5.12 mg/dL — ABNORMAL HIGH (ref 0.61–1.24)
GFR, Estimated: 10 mL/min — ABNORMAL LOW (ref >60)
Glucose, Bld: 120 mg/dL — ABNORMAL HIGH (ref 70–99)
Potassium: 4.2 mmol/L (ref 3.5–5.1)
Sodium: 140 mmol/L (ref 135–145)

## 2023-11-28 LAB — CBC
HCT: 26.1 % — ABNORMAL LOW (ref 39.0–52.0)
Hemoglobin: 8 g/dL — ABNORMAL LOW (ref 13.0–17.0)
MCH: 38.6 pg — ABNORMAL HIGH (ref 26.0–34.0)
MCHC: 30.7 g/dL (ref 30.0–36.0)
MCV: 126.1 fL — ABNORMAL HIGH (ref 80.0–100.0)
Platelets: 150 10*3/uL (ref 150–400)
RBC: 2.07 MIL/uL — ABNORMAL LOW (ref 4.22–5.81)
RDW: 17 % — ABNORMAL HIGH (ref 11.5–15.5)
WBC: 7.1 10*3/uL (ref 4.0–10.5)
nRBC: 0 % (ref 0.0–0.2)

## 2023-11-28 LAB — LACTIC ACID, PLASMA
Lactic Acid, Venous: 5.1 mmol/L (ref 0.5–1.9)
Lactic Acid, Venous: 1.3 mmol/L (ref 0.5–1.9)
Lactic Acid, Venous: 1.8 mmol/L (ref 0.5–1.9)
Lactic Acid, Venous: 1.6 mmol/L (ref 0.5–1.9)

## 2023-11-28 LAB — TROPONIN I (HIGH SENSITIVITY): Troponin I (High Sensitivity): 26 ng/L — ABNORMAL HIGH (ref <18)

## 2023-11-28 LAB — CBG MONITORING, ED
Glucose-Capillary: 104 mg/dL — ABNORMAL HIGH (ref 70–99)
Glucose-Capillary: 92 mg/dL (ref 70–99)

## 2023-11-28 MED ORDER — APIXABAN 2.5 MG PO TABS
2.5000 mg | ORAL_TABLET | Freq: Two times a day (BID) | ORAL | Status: DC
  Administered 2023-11-28 – 2023-12-03 (×10): 2.5 mg via ORAL
  Filled 2023-11-28 (×10): qty 1

## 2023-11-28 MED ORDER — DEXTROSE IN LACTATED RINGERS 5 % IV SOLN: INTRAVENOUS | Status: AC

## 2023-11-28 MED ORDER — ALBUMIN HUMAN 25 % IV SOLN
50.0000 g | INTRAVENOUS | Status: AC
  Administered 2023-11-28: 50 g via INTRAVENOUS
  Filled 2023-11-28: qty 200

## 2023-11-28 MED ORDER — MIDODRINE HCL 5 MG PO TABS
15.0000 mg | ORAL_TABLET | Freq: Three times a day (TID) | ORAL | Status: DC
  Administered 2023-11-28 – 2023-11-29 (×6): 15 mg via ORAL
  Filled 2023-11-28 (×7): qty 3

## 2023-11-28 MED ORDER — SODIUM CHLORIDE 0.9 % IV BOLUS
500.0000 mL | INTRAVENOUS | Status: AC
  Administered 2023-11-28: 500 mL via INTRAVENOUS

## 2023-11-28 MED ORDER — LEVETIRACETAM (KEPPRA) 500 MG/5 ML ADULT IV PUSH
1500.0000 mg | INTRAVENOUS | Status: AC
  Administered 2023-11-28: 1500 mg via INTRAVENOUS
  Filled 2023-11-28: qty 15

## 2023-11-28 MED ORDER — ACETAMINOPHEN 650 MG RE SUPP: 650.0000 mg | Freq: Four times a day (QID) | RECTAL | Status: DC | PRN

## 2023-11-28 MED ORDER — SODIUM CHLORIDE 0.9 % IV BOLUS
500.0000 mL | Freq: Once | INTRAVENOUS | Status: AC
  Administered 2023-11-28: 500 mL via INTRAVENOUS

## 2023-11-28 MED ORDER — ACETAMINOPHEN 325 MG PO TABS: 650.0000 mg | ORAL_TABLET | Freq: Four times a day (QID) | ORAL | Status: DC | PRN

## 2023-11-28 MED ORDER — LORAZEPAM 2 MG/ML IJ SOLN: 2.0000 mg | Freq: Four times a day (QID) | INTRAMUSCULAR | Status: DC | PRN

## 2023-11-28 MED ORDER — MIDODRINE HCL 5 MG PO TABS: 15.0000 mg | ORAL_TABLET | Freq: Three times a day (TID) | ORAL | Status: DC

## 2023-11-28 MED ORDER — ATORVASTATIN CALCIUM 10 MG PO TABS
20.0000 mg | ORAL_TABLET | Freq: Every day | ORAL | Status: DC
  Administered 2023-11-28 – 2023-12-03 (×6): 20 mg via ORAL
  Filled 2023-11-28 (×6): qty 2

## 2023-11-28 MED ORDER — TIMOLOL MALEATE 0.5 % OP SOLN
1.0000 [drp] | Freq: Two times a day (BID) | OPHTHALMIC | Status: DC
  Administered 2023-11-28 – 2023-12-03 (×10): 1 [drp] via OPHTHALMIC
  Filled 2023-11-28: qty 5

## 2023-11-28 NOTE — Progress Notes (Addendum)
 JP/surgical drain displacement RN reported that nephrostomy JP drain is not in correct position and is displaced.  CT abdomen pelvis showing stable left transgluteal surgical drain with the distal tip seen within the presacral space.  No residual abscess identified. -It is possible that the drain has been displaced after the CT scan was done and during moving the patient.  -Physical exam showed displaced JP drain and the stitching has been out. -Consulted IR to assess and possible exchange of the displaced left transgluteal surgical drain.  Marci Polito, MD [facility] 11/28/2023, 6:26 AM

## 2023-11-28 NOTE — Progress Notes (Signed)
 MRI brain is ordered. Touched basis with MRI Tech: Polly Brink. Patient has a penile pump. The type need to be confirmed before MRI can be completed.Achilles Holes MD notified

## 2023-11-28 NOTE — Progress Notes (Signed)
 PROGRESS NOTE    [name]  YQM:[id number] DOB: 05-04-1936 DOA: 11/27/2023 PCP: Almira Jaeger, MD   Brief Narrative: Joshua Riley is a 88 y.o. male with a history of ESRD on HD, atrial fibrillation on Eliquis , CVA, hyperlipidemia, depression, obstructive uropathy s/p left-sided percutaneous nephrostomy, ulcerative colitis s/p colectomy with ileostomy, ileal pouch and anal anastomosis, chronic hypotension on midodrine , anemia of chronic disease, sacral osteomyelitis on antibiotics.  Patient presented secondary to syncope with concern for hypotension mediated syncope. Hospitalization complicated by seizure-like activity.   Assessment/Plan:  Syncope Appears to be secondary to hypotension, however patient had seizure-like activity during admission. Patient placed on telemetry. Recent Transthoracic Echocardiogram significant for normal LVEF and no evidence of aortic stenosis.  Symptomatic hypotension Chronic hypotension. Patient is on midodrine  as an outpatient. -Continue midodrine   Seizure-like activity Present after admission and witnessed by nursing staff, lasting 2 minutes with spontaneous cessation. Neurology consulted and recommended Keppra, MRI and EEG without formal consult. Neurology re-consulted for formal on 5/24 and declined to formally consult. -Follow-up MRI brain and EEG results  Lactic acidosis Lactic acid up to 5.1 on admission. In setting of hypotension, but also possibly related to possible seizure. Resolved.  ESRD on hemodialysis Nephrology consulted for management.  Presacral fluid collection Patient is s/p drain placement on 5/7, prior to this admission. During this admission, drain fell out during hospitalization. IR consulted with recommendation for no replacement secondary to no current fluid collection noted on imaging.  Abnormal bladder CT Moderate amount of heterogenous material seen within the urinary bladder, possibly related to diluted  contrast versus blood products. Additionally, CT notes bladder wall thickening, unable to exclude neoplastic process. Patient will need urology follow-up.  Paroxysmal atrial fibrillation Patient is on Eliquis  as an outpatient. Eliquis  held secondary to concern for possible blood products seen in bladder on CT pelvis.  Ulcerative colitis Patient is s/p colectomy with ileostomy and ileal pouch and anal anastomosis. -Continue ostomy care  History of CVA Hyperlipidemia Patient is on Lipitor as an outpatient. He is also on Eliquis .  Depression Patient was previously on Zoloft  which appears to have been discontinued prior to admission.  Sacral osteomyelitis Patient is on Vancomycin  and Ertapenem  as an outpatient, with last day of therapy scheduled for 5/28.   DVT prophylaxis: Eliquis  Code Status:   Code Status: Full Code Family Communication: None at bedside Disposition Plan: Discharge pending syncope and seizure-like activity workup   Consultants:  Neurology (declined)  Procedures:  None  Antimicrobials: Vancomycin  Meropenem     Subjective: Patient reports no concerns this morning.  Objective: BP 104/60 (BP Location: Left Arm)   Pulse 73   Temp (!) 96.9 F (36.1 C) (Oral)   Resp (!) 22   SpO2 100%   Examination:  General exam: Appears calm and comfortable Respiratory system: Clear to auscultation. Respiratory effort normal. Cardiovascular system: S1 & S2 heard, RRR. No murmurs. Gastrointestinal system: Abdomen is nondistended, soft and nontender. Normal bowel sounds heard. Central nervous system: Somnolent, but arouses and answers questions appropriately. Musculoskeletal: No edema. No calf tenderness Psychiatry: Judgement and insight appear normal. Mood & affect appropriate.    Data Reviewed: I have personally reviewed following labs and imaging studies   Last CBC Lab Results  Component Value Date   WBC 7.1 11/28/2023   HGB 8.0 (L) 11/28/2023   HCT 26.1  (L) 11/28/2023   MCV 126.1 (H) 11/28/2023   MCH 38.6 (H) 11/28/2023   RDW 17.0 (H) 11/28/2023   PLT 150 11/28/2023  Last metabolic panel Lab Results  Component Value Date   GLUCOSE 120 (H) 11/28/2023   NA 140 11/28/2023   K 4.2 11/28/2023   CL 100 11/28/2023   CO2 24 11/28/2023   BUN 16 11/28/2023   CREATININE 5.12 (H) 11/28/2023   GFRNONAA 10 (L) 11/28/2023   CALCIUM  8.7 (L) 11/28/2023   PHOS 3.8 11/13/2023   PROT 6.6 11/27/2023   ALBUMIN  3.3 (L) 11/27/2023   BILITOT 0.8 11/27/2023   ALKPHOS 69 11/27/2023   AST 25 11/27/2023   ALT 15 11/27/2023   ANIONGAP 16 (H) 11/28/2023     Creatinine Clearance: Estimated Creatinine Clearance: 10.2 mL/min (A) (by C-G formula based on SCr of 5.12 mg/dL (H)).  Recent Results (from the past 240 hours)  Resp panel by RT-PCR (RSV, Flu A&B, Covid) Anterior Nasal Swab     Status: None   Collection Time: 11/27/23  2:04 PM   Specimen: Anterior Nasal Swab  Result Value Ref Range Status   SARS Coronavirus 2 by RT PCR NEGATIVE NEGATIVE Final   Influenza A by PCR NEGATIVE NEGATIVE Final   Influenza B by PCR NEGATIVE NEGATIVE Final    Comment: (NOTE) The Xpert Xpress SARS-CoV-2/FLU/RSV plus assay is intended as an aid in the diagnosis of influenza from Nasopharyngeal swab specimens and should not be used as a sole basis for treatment. Nasal washings and aspirates are unacceptable for Xpert Xpress SARS-CoV-2/FLU/RSV testing.  Fact Sheet for Patients: BloggerCourse.com  Fact Sheet for Healthcare Providers: SeriousBroker.it  This test is not yet approved or cleared by the [place]  FDA and has been authorized for detection and/or diagnosis of SARS-CoV-2 by FDA under an Emergency Use Authorization (EUA). This EUA will remain in effect (meaning this test can be used) for the duration of the COVID-19 declaration under Section 564(b)(1) of the Act, 21 U.S.C. section 360bbb-3(b)(1),  unless the authorization is terminated or revoked.     Resp Syncytial Virus by PCR NEGATIVE NEGATIVE Final    Comment: (NOTE) Fact Sheet for Patients: BloggerCourse.com  Fact Sheet for Healthcare Providers: SeriousBroker.it  This test is not yet approved or cleared by the [place]  FDA and has been authorized for detection and/or diagnosis of SARS-CoV-2 by FDA under an Emergency Use Authorization (EUA). This EUA will remain in effect (meaning this test can be used) for the duration of the COVID-19 declaration under Section 564(b)(1) of the Act, 21 U.S.C. section 360bbb-3(b)(1), unless the authorization is terminated or revoked.  Performed at [facility], [address] [place], [place] [address]   Blood Culture (routine x 2)     Status: None (Preliminary result)   Collection Time: 11/27/23  2:12 PM   Specimen: BLOOD RIGHT FOREARM  Result Value Ref Range Status   Specimen Description BLOOD RIGHT FOREARM  Final   Special Requests   Final    BOTTLES DRAWN AEROBIC AND ANAEROBIC Blood Culture results may not be optimal due to an inadequate volume of blood received in culture bottles   Culture   Final    NO GROWTH < 24 HOURS Performed at [facility], [address], [place], [place] [address]    Report Status PENDING  Incomplete  Blood Culture (routine x 2)     Status: None (Preliminary result)   Collection Time: 11/27/23  2:30 PM   Specimen: BLOOD  Result Value Ref Range Status   Specimen Description BLOOD RIGHT ANTECUBITAL  Final   Special Requests   Final  BOTTLES DRAWN AEROBIC AND ANAEROBIC Blood Culture adequate volume   Culture   Final    NO GROWTH < 24 HOURS Performed at [facility], [address], [place], [place] [address]    Report Status PENDING  Incomplete      Radiology Studies: CT HEAD WO CONTRAST ( ) Result Date: 11/28/2023 CLINICAL DATA:  Syncope/presyncope. EXAM: CT HEAD  WITHOUT CONTRAST TECHNIQUE: Contiguous axial images were obtained from the base of the skull through the vertex without intravenous contrast. RADIATION DOSE REDUCTION: This exam was performed according to the departmental dose-optimization program which includes automated exposure control, adjustment of the mA and/or kV according to patient size and/or use of iterative reconstruction technique. COMPARISON:  06/02/2022 FINDINGS: Brain: No evidence of acute infarction, hemorrhage, hydrocephalus, extra-axial collection or mass lesion/mass effect. There is mild diffuse low-attenuation within the subcortical and periventricular white matter compatible with chronic microvascular disease. Prominence of the sulci and ventricles compatible with brain atrophy. Vascular: No hyperdense vessel or unexpected calcification. Skull: Normal. Negative for fracture or focal lesion. Sinuses/Orbits: Chronic appearing mucosal thickening within bilateral maxillary sinuses. No acute abnormality. Other: None IMPRESSION: 1. No acute intracranial abnormalities. 2. Chronic microvascular disease and brain atrophy. 3. Chronic appearing mucosal thickening within bilateral maxillary sinuses. Electronically Signed   By: Kimberley Penman M.D.   On: 11/28/2023 05:38   CT PELVIS W CONTRAST Result Date: 11/27/2023 CLINICAL DATA:  Suspected perianal abscess or fistula. EXAM: CT PELVIS WITH CONTRAST TECHNIQUE: Multidetector CT imaging of the pelvis was performed using the standard protocol following the bolus administration of intravenous contrast. RADIATION DOSE REDUCTION: This exam was performed according to the departmental dose-optimization program which includes automated exposure control, adjustment of the mA and/or kV according to patient size and/or use of iterative reconstruction technique. CONTRAST:  75mL OMNIPAQUE  IOHEXOL  350 MG/ML SOLN COMPARISON:  Nov 25, 2023 FINDINGS: Urinary Tract: Urinary bladder is poorly distended and subsequently  limited in evaluation. A moderate amount of mildly dense heterogeneous material is seen within the bladder lumen (approximately 88.00 Hounsfield units). This represents a new finding when compared to the prior study. Stable, asymmetric moderate severity urinary bladder wall thickening is seen, most prominent along the anterior and lateral aspect of the urinary bladder, left greater than right. Bowel: There is evidence of prior total colectomy. No dilated bowel loops are seen. Vascular/Lymphatic: There is marked severity calcification of the visualized portion of the abdominal aorta. This extends to involve the bilateral common iliac arteries and their branches. A patent left femoral loop dialysis graft is seen. Reproductive: There is stable, moderate to marked severity prostatomegaly. A penile pump is in place with its associated reservoir seen within the anterior aspect of the lower pelvis on the right. Other:  A right lower quadrant ostomy site is seen. Musculoskeletal: A left-sided transgluteal surgical drain is again seen. Its distal tip is seen within the presacral space and is surrounded by a moderate amount of stable soft tissue thickening and inflammatory fat stranding. No residual abscess is identified. Degenerative changes are seen within the visualized portion of the lower lumbar spine. No acute osseous abnormalities are identified. IMPRESSION: 1. Stable left-sided transgluteal surgical drain with its distal tip seen within the presacral space. No residual abscess is identified. 2. Moderate amount of mildly dense heterogeneous material within the urinary bladder lumen which may represent diluted contrast versus blood products. Correlation with urinalysis is recommended. 3. Stable, asymmetric moderate severity urinary bladder wall thickening. Sequelae associated with an underlying neoplastic process  cannot be excluded 4. Evidence of prior total colectomy with subsequent right lower quadrant ostomy site. 5.  Aortic atherosclerosis. Electronically Signed   By: Virgle Grime M.D.   On: 11/27/2023 20:08   DG Chest Port 1 View Result Date: 11/27/2023 CLINICAL DATA:  Possible sepsis EXAM: PORTABLE CHEST 1 VIEW COMPARISON:  11/08/2023 FINDINGS: Cardiomegaly with mild atelectasis or scar at left base. No acute airspace disease, pleural effusion or pneumothorax. Aortic atherosclerosis. Right-sided subclavian vascular stent. IMPRESSION: No active disease. Cardiomegaly. Electronically Signed   By: Esmeralda Hedge M.D.   On: 11/27/2023 16:15      LOS: 0 days    Aneita Keens, MD [facility] 11/28/2023, 1:38 PM   If 7PM-7AM, please contact night-coverage [email or web address]

## 2023-11-28 NOTE — ED Notes (Signed)
 This RN had two failed attempts at iv . Iv TEAM consult placed.

## 2023-11-28 NOTE — Progress Notes (Addendum)
 New onset of generalized tonic-clonic seizure - Nurse reported that at 5:40 AM patient had generalized tonic-clonic seizure which lasted for 2 minutes and subsided itself.  Post seizure patient is confused however able to maintain his airway O2 sat 100% on 4 L oxygen.  Blood pressure is soft as patient is being admitted for hypotension and syncope and management for sepsis. -CT scan no acute intracranial abnormality. - Discussed case with on-call neurology who recommended to load with Keppra, obtain head CT scan, MRI and routine EEG. - If MRI shows any intracranial abnormality or abnormal EEG in that case need to reach out to neurology again for formal consult - As head CT scan already has been done at 5:20 AM no need to repeat it. -Obtaining MRI of the brain and EEG. - Ordered Keppra 1500 mg IV one-time load - Continue Ativan 2 mg every 6 hour as needed for seizure. -Continue seizure precaution.   Chronic hypotension Presacral fluid collection status post drain placement by IR on 11/11/2023  - Due to the soft blood pressure and at the same time patient is dialysis dependent giving 500 mL of NS bolus and 50 g of albumin . -Patient is already being covered with IV meropenem  and vancomycin  management of sepsis and source of infection is presacral fluid collection status post drain placement by IR 5/7.   Ali Mohl, MD [facility] 11/28/2023, 6:09 AM

## 2023-11-28 NOTE — Consult Note (Signed)
 NEUROLOGY CONSULT NOTE   Date of service: Nov 28, 2023 Patient Name: Joshua Riley MRN:  [id number] DOB:  07-Dec-1935 Chief Complaint: Seizure-like activity Requesting Provider: Verlyn Goad, MD  History of Present Illness  Joshua Riley is a 88 y.o. male with a past medical history significant, ESRD (M/W/F) on dialysis, hypotension on midodrine , DVT and A-fib on Eliquis , prior CVAs, sacral osteomyelitis secondary to infected groin abscess with recurrent presacral fluid collections, obstructive uropathy with chronic left-sided percutaneous nephrostomy, right sided hydroureter, ulcerative colitis s/p colectomy (2011, with further surgeries in 2012), chronic macrocytic anemia, celiac artery narrowing, neuropathy and multiple recent admissions.  Per ED triage notes during PT he had sudden onset dizziness/lightheadedness and a syncopal episode with initial systolic blood pressures in the 80s on EMS arrival.  Course was also notable for some intermittent confusion  Subsequently at 5:40 AM in the setting of hypotension had a episode of generalized shaking for 2 minutes, self-limited, reportedly confused afterwards but also remained hypotensive.  He was treated with 500 mL of normal saline bolus, 50 g of albumin , and 1500 mg IV Keppra  Discussed patient's history with patient's daughter. She states that patient has never had a seizure before and that she did not observe the event that happened at home. She states that a home health caregiver noted that patient's bilateral feet were shaking and twitching and the caregiver felt that this was seizure activity. Patient has no personal or family history of seizures, did not have febrile seizures as a child, has never had meningitis or encephalitis, has never had an ischemic or hemorrhagic stroke and did have a fall where he struck his head a few weeks ago but was never diagnosed with a concussion or other serious head injury.  She also notes they have been  arguing about him needing more supervision at home recently   Regarding his other recent admissions:  Admitted for small bowel obstruction in 3/2 through 3/4 managed conservatively,  Then presenting with chest pain, admitted 5/4 through 5/9, treated for acute pericarditis with ibuprofen .  Also noted at that time to have worsening presacral fluid collection for which repeat drain placement was made on 11/11/2023.  Cultures grew E. coli, E faecalis, plan for antibiotics through 12/02/2023 (IV vancomycin  and IV ertapenem ).  He was also treated with albumin  for his chronic orthostatic hypotension      ROS  Limited by mental status/patient participation, pertinent as above  Past History   Past Medical History:  Diagnosis Date   A-fib (HCC)    Anemia    Arthritis    Cancer (HCC)    Basal cell   COVID-19    2021   Dysrhythmia    Afib-controlled on eliquis    ESRD (end stage renal disease) (HCC) 10/22/2021   Glaucoma 11/18/2021   History of DVT (deep vein thrombosis)    Hydronephrosis    managed wtih a PCN   Idiopathic neuropathy 10/22/2021   lyrica     Ileostomy in place [facility])    Obstructive uropathy    With chronic left nephrostomy   Old retinal detachment, total or subtotal    Orthostatic hypotension 10/22/2021   Sleep apnea    does not need a machine   Stroke (HCC)    Ulcerative colitis (HCC)    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    Past Surgical History:  Procedure Laterality Date   BASAL CELL CARCINOMA EXCISION     10/23   COLON SURGERY  creation of j pouch     and subsequent takedown of j pouch   EYE SURGERY     IR NEPHROSTOMY EXCHANGE LEFT  12/10/2021   IR NEPHROSTOMY EXCHANGE LEFT  04/22/2022   IR NEPHROSTOMY EXCHANGE LEFT  07/29/2022   IR NEPHROSTOMY EXCHANGE LEFT  10/28/2022   IR NEPHROSTOMY EXCHANGE LEFT  02/05/2023   IR NEPHROSTOMY EXCHANGE LEFT  05/07/2023   IR NEPHROSTOMY EXCHANGE LEFT  06/25/2023   IR NEPHROSTOMY EXCHANGE LEFT   07/17/2023   IR NEPHROSTOMY EXCHANGE LEFT  10/22/2023   IR RADIOLOGIST EVAL & MGMT  11/25/2023   LEFT HEART CATH AND CORONARY ANGIOGRAPHY N/A 07/22/2022   Procedure: LEFT HEART CATH AND CORONARY ANGIOGRAPHY;  Surgeon: Swaz[place], Peter M, MD;  Location: [facility] INVASIVE CV LAB;  Service: Cardiovascular;  Laterality: N/A;   REVISION OF ARTERIOVENOUS GORETEX GRAFT Left 05/06/2022   Procedure: REDO LEFT THIGH ARTERIOVENOUS 4-7 MM GORETEX GRAFT;  Surgeon: Dannis Dy, MD;  Location: [facility] OR;  Service: Vascular;  Laterality: Left;   SMALL INTESTINE SURGERY     TOTAL COLECTOMY      Family History: Family History  Problem Relation Age of Onset   Stroke Mother    Cancer Father    Esophageal cancer Brother     Social History  reports that he quit smoking about 40 years ago. His smoking use included cigarettes. He started smoking about 46 years ago. He has a 12 pack-year smoking history. He has never been exposed to tobacco smoke. He has never used smokeless tobacco. He reports current alcohol use of about 5.0 standard drinks of alcohol per week. He reports that he does not use drugs.  Allergies  Allergen Reactions   Baclofen Other (See Comments)    Altered mental status, after accidental overdose     Cephalosporins Rash   Ciprofloxacin Itching and Rash    Medications   Current Facility-Administered Medications:    acetaminophen  (TYLENOL ) tablet 650 mg, 650 mg, Oral, Q6H PRN **OR** acetaminophen  (TYLENOL ) suppository 650 mg, 650 mg, Rectal, Q6H PRN, Juliette Oh, MD   apixaban  (ELIQUIS ) tablet 2.5 mg, 2.5 mg, Oral, BID, Duard Getting, Mozell Arias, MD   atorvastatin  (LIPITOR) tablet 20 mg, 20 mg, Oral, Daily, Verlyn Goad, MD, 20 mg at 11/28/23 1727   dextrose  5 % in lactated ringers  infusion, , Intravenous, Continuous, Sundil, Subrina, MD, Last Rate: 50 mL/hr at 11/28/23 1043, New Bag at 11/28/23 1043   LORazepam (ATIVAN) injection 2 mg, 2 mg, Intravenous, Q6H PRN, Sundil, Subrina, MD    meropenem  (MERREM ) 1 g in sodium chloride  0.9 % 100 mL IVPB, 1 g, Intravenous, Daily, Juliette Oh, MD, Stopped at 11/28/23 1126   midodrine  (PROAMATINE ) tablet 15 mg, 15 mg, Oral, TID WC, Rathore, Vasundhra, MD, 15 mg at 11/28/23 1727   timolol  (TIMOPTIC ) 0.5 % ophthalmic solution 1 drop, 1 drop, Both Eyes, BID, Nettey, Mozell Arias, MD   [START ON 11/30/2023] vancomycin  (VANCOREADY) IVPB 750 mg/150 mL, 750 mg, Intravenous, Q M,W,F-HD, Juliette Oh, MD  Current Outpatient Medications:    albuterol  (PROVENTIL ) (2.5 MG/3ML) 0.083% nebulizer solution, Take 2.5 mg by nebulization every 6 (six) hours as needed for wheezing or shortness of breath., Disp: , Rfl:    albuterol  (VENTOLIN  HFA) 108 (90 Base) MCG/ACT inhaler, Inhale 1-2 puffs into the lungs every 6 (six) hours as needed for wheezing or shortness of breath., Disp: , Rfl:    atorvastatin  (LIPITOR) 20 MG tablet, Take 1 tablet (20 mg total) by mouth daily., Disp:  90 tablet, Rfl: 0   budesonide  (PULMICORT ) 0.5 MG/2ML nebulizer solution, Take 0.5 mg by nebulization 2 (two) times daily as needed (for respiratory flares)., Disp: , Rfl:    Cyanocobalamin (VITAMIN B-12 PO), Take 1 capsule by mouth in the morning and at bedtime., Disp: , Rfl:    dorzolamide  (TRUSOPT ) 2 % ophthalmic solution, Place 1 drop into the right eye at bedtime., Disp: , Rfl:    ELIQUIS  2.5 MG TABS tablet, Take 1 tablet (2.5 mg total) by mouth 2 (two) times daily., Disp: 60 tablet, Rfl: 11   ertapenem  (INVANZ ) IVPB, Inject 1 g into the vein every Monday, Wednesday, and Friday with hemodialysis for 19 days. **To be given at dialysis** Indication: Sacral osteomyelitis  First Dose: Yes  Last Day of Therapy:  5/28 Labs - Once weekly:  CBC/D and BMP, Labs - Once weekly: ESR and CRP Method of administration: Mini-Bag Plus / Gravity Method of administration may be changed at the discretion of home infusion pharmacist based upon assessment of the patient and/or caregiver's ability to  self-administer the medication ordered., Disp: , Rfl:    ertapenem  1 g in sodium chloride  0.9 % 100 mL, Inject 1 g into the vein daily for 19 days., Disp: , Rfl:    folic acid  (FOLVITE ) 1 MG tablet, Take 1 tablet (1 mg total) by mouth daily., Disp: 30 tablet, Rfl: 0   gabapentin  (NEURONTIN ) 300 MG capsule, Take 300 mg by mouth at bedtime., Disp: , Rfl:    loperamide  (IMODIUM ) 2 MG capsule, Take 1 capsule (2 mg total) by mouth as needed for diarrhea or loose stools., Disp: 20 capsule, Rfl: 0   midodrine  (PROAMATINE ) 5 MG tablet, Take 3 tablets (15 mg total) by mouth 3 (three) times daily., Disp: 270 tablet, Rfl: 1   mometasone -formoterol  (DULERA ) 200-5 MCG/ACT AERO, Inhale 2 puffs into the lungs 2 (two) times daily., Disp: 1 each, Rfl: 1   multivitamin (RENA-VIT) TABS tablet, Take 1 tablet by mouth daily., Disp: , Rfl:    nitroGLYCERIN  (NITROSTAT ) 0.4 MG SL tablet, Place 1 tablet (0.4 mg total) under the tongue every 5 (five) minutes as needed for chest pain., Disp: 25 tablet, Rfl: 0   pantoprazole  (PROTONIX ) 40 MG tablet, Take 1 tablet (40 mg total) by mouth 2 (two) times daily. (Patient taking differently: Take 40 mg by mouth daily as needed (indigestion).), Disp: 60 tablet, Rfl: 1   ramelteon  (ROZEREM ) 8 MG tablet, Take 1 tablet (8 mg total) by mouth at bedtime. (Patient taking differently: Take 8 mg by mouth at bedtime as needed for sleep.), Disp: 30 tablet, Rfl: 5   sevelamer  carbonate (RENVELA ) 800 MG tablet, Take 1,600 mg by mouth 3 (three) times daily with meals., Disp: , Rfl:    Sodium Chloride  Flush (SALINE FLUSH) 0.9 % SOLN, Use 5 mLs by Intracatheter route daily as directed., Disp: 150 mL, Rfl: 1   timolol  (TIMOPTIC ) 0.5 % ophthalmic solution, Place 1 drop into both eyes in the morning and at bedtime., Disp: , Rfl:    traMADol  (ULTRAM ) 50 MG tablet, Take 1 tablet (50 mg total) by mouth every 8 (eight) hours as needed (for pain). (Patient taking differently: Take 50 mg by mouth every 8  (eight) hours as needed for moderate pain (pain score 4-6) or severe pain (pain score 7-10).), Disp: 15 tablet, Rfl: 0   TYLENOL  325 MG tablet, Take 325-650 mg by mouth every 6 (six) hours as needed for mild pain (pain score 1-3) or headache., Disp: ,  Rfl:    vancomycin  IVPB, Inject 750 mg into the vein every Monday, Wednesday, and Friday with hemodialysis for 19 days. **To be given at dialysis** Indication: Sacral osteomyelitis  First Dose: Yes  Last Day of Therapy:  5/28 Labs - Once weekly:  CBC/D and BMP, Labs - Once weekly: ESR and CRP Method of administration: Mini-Bag Plus / Gravity Method of administration may be changed at the discretion of home infusion pharmacist based upon assessment of the patient and/or caregiver's ability to self-administer the medication ordered., Disp: , Rfl:    Omega Fatty Acids-Vitamins (OMEGA-3 GUMMIES) CHEW, Chew 1 tablet by mouth in the morning and at bedtime. (Patient not taking: Reported on 11/28/2023), Disp: , Rfl:    sertraline  (ZOLOFT ) 50 MG tablet, Take 25 mg by mouth at bedtime. (Patient not taking: Reported on 11/28/2023), Disp: , Rfl:   Vitals   Vitals:   11/28/23 1615 11/28/23 1645 11/28/23 1700 11/28/23 1715  BP: (!) 86/52 (!) 101/47 (!) 94/52 (!) 94/59  Pulse: (!) 104 (!) 55 (!) 56 (!) 55  Resp: 18     Temp:      TempSrc:      SpO2: (!) 80% 100% 99% 99%    There is no height or weight on file to calculate BMI.  Physical Exam   Constitutional: Appears comfortable, no acute distress Psych: Affect frequently joking, appears to use humor to try to hide mild confusion Eyes: No scleral injection.  HENT: No OP obstruction. Head: Normocephalic.  Cardiovascular: Normal rate and regular rhythm.  Respiratory: Effort normal, non-labored breathing.  Nasal cannula is displaced and on his cheek, and he seems unaware of this GI: Soft.  No distension. There is no tenderness.   Neurologic Examination   Mental Status: Patient is awake, alert, oriented to  person, place (initially states "your room" but then claims he knows he is in the hospital), month, year, but not details of situation.  He reports that it is Tuesday the 16th instead of Saturday the 24th Patient gives very limited history No signs of aphasia or neglect, though somewhat limited language testing as he appears to be hard of hearing as well Poor attention/concentration Cranial Nerves: II: Visual Fields are full to orienting to stimuli in all visual fields. Pupils are equal, round, and reactive to light.   III,IV, VI: EOMI without ptosis or diploplia.  Saccadic pursuits V: Facial sensation is symmetric to light touch VII: Facial movement is symmetric.  VIII: hearing is hard of hearing X: Uvula incompletely visualized  XII: tongue is midline without atrophy or fasciculations.  Motor: Proximal muscle weakness in the deltoids and hip flexors.  Right lower extremity is particularly limited by pain for hip flexion.  Otherwise 5/5 in the bilateral triceps, biceps, knee flexors, knee extensors, though he requires extensive coaching for examination due to his confusion Sensory: Sensation is symmetric to light touch and temperature in the arms and legs.  No extinction to double simultaneous stimuli Deep Tendon Reflexes: 2+ and symmetric in the brachioradialis  Cerebellar: Finger-nose intact bilaterally Gait:  Deferred    Labs/Imaging/Neurodiagnostic studies    Basic Metabolic Panel: Recent Labs  Lab 11/27/23 1412 11/28/23 0420  NA 141 140  K 3.6 4.2  CL 99 100  CO2 30 24  GLUCOSE 108* 120*  BUN 14 16  CREATININE 4.15* 5.12*  CALCIUM  8.8* 8.7*    CBC: Recent Labs  Lab 11/27/23 1412 11/28/23 0420  WBC 7.9 7.1  NEUTROABS 6.9  --  HGB 8.0* 8.0*  HCT 25.2* 26.1*  MCV 121.2* 126.1*  PLT 171 150    Coagulation Studies: Recent Labs    11/27/23 1412  LABPROT 14.4  INR 1.1    Lactate 5.1 on arrival, improving to 1.6  Lipid Panel:  Lab Results  Component  Value Date   LDLCALC 15 11/10/2023   HgbA1c:  Lab Results  Component Value Date   HGBA1C 5.9 (H) 11/09/2023    Alcohol Level     Component Value Date/Time   [facility] <15 11/09/2023 2152   INR  Lab Results  Component Value Date   INR 1.1 11/27/2023   APTT  Lab Results  Component Value Date   APTT 64 (H) 11/12/2023   CT Head without contrast(Personally reviewed):  1. No acute intracranial abnormalities. 2. Chronic microvascular disease and brain atrophy. 3. Chronic appearing mucosal thickening within bilateral maxillary sinuses.   Neurodiagnostics rEEG:  Report pending  ASSESSMENT   Joshua Riley is a 88 y.o. male for whom neurology is asked to evaluate for seizure-like activity.  History is limited from the patient, and there is no eyewitness to provide details.  Certainly it is reasonable to obtain an MRI brain (without contrast due to ESRD on dialysis) as well as a routine EEG.    His current examination seems most consistent with delirium, with some difficulty in determining his actual level of confusion due to his tendency to joke to try to minimize symptoms  Certainly recurrent hypotension could lead to cerebral hypoperfusion which would also contribute to confusion.  Additionally the history as provided (generalized shaking in the setting of low blood pressure) is more consistent with post syncopal convulsion.  Nevertheless at this time with no concerning focality on his examination (pain limited right hip flexion more than left hip flexion), and overall minimal risk factors for seizure other than his global atrophy, if MRI and EEG are reassuring would not pursue further workup or continue treatment with antiseizure medications at this time  RECOMMENDATIONS  -MRI brain without contrast -Routine EEG -S/p 1500 mg of Keppra overnight due to concern for clinical stability, would not recommend any further antiseizure medications if EEG and MRI reassuring -Neurology  will follow-up MRI brain and EEG report otherwise we will sign off at this time.  Please reach out if additional questions or concerns arise ______________________________________________________________________   Baldwin Levee MD-PhD [facility] [phone] Available 7 AM to 7 PM, outside these hours please contact Neurologist on call listed on AMION

## 2023-11-28 NOTE — H&P (Signed)
 History and Physical    Joshua Riley ZOX:[id number] DOB: [birth year] DOA: 11/27/2023  PCP: Almira Jaeger, MD  Patient coming from: Home  Chief Complaint: Syncope  HPI: Joshua Riley is a 88 y.o. 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  Admitted again 5/4-5/9 for acute pericarditis and was treated with a 10-day course of ibuprofen .  Patient with history of presacral fluid collection and has had multiple drains for presacral fluid collection in the past and was not a candidate for surgery.  He finished 6 weeks of antibiotics in January 2025.  CT done during this admission was showing worsening fluid collection concerning for osteomyelitis.  He was seen by general surgery and it was felt that there were no surgical options for this problem.  General surgery had recommended drain placement by IR which was done on 11/11/2023 with cultures growing abundant E. coli and Enterococcus faecalis.  Patient was treated with IV vancomycin  and meropenem  during this hospitalization and was discharged on IV vancomycin  and IV ertapenem  to be given during dialysis per ID recommendation.  End date of antibiotic therapy 12/02/2023.  He is on midodrine  for chronic hypotension and dose of midodrine  was increased.  Patient presents to the ED today for evaluation of syncope which was witnessed by PT and home health RN.  Patient reported to have returned from dialysis around 1000 and during PT had sudden onset dizziness/lightheadedness and a syncopal episode.  When EMS arrived, his initial blood pressure was 86/40 and CBG 178.   He was satting 95% on room air.  He was AAO x 4 but had some intermittent confusion.  Hypotensive on arrival to the ED but afebrile and not tachycardic.  Not hypoxic.  Labs showing no leukocytosis, hemoglobin 8.0 (previously in the 8-9 range on labs 2 weeks ago), potassium 3.6, bicarb 30, glucose 108, normal LFTs, COVID/influenza/RSV PCR negative, INR 1.1, blood cultures in process, lactic acid 2.0> 2.1> 1.4> 2.3.  Chest x-ray showing no active disease.  CT abdomen pelvis with contrast showing: "IMPRESSION: 1. Stable left-sided transgluteal surgical drain with its distal tip seen within the presacral space. No residual abscess is identified. 2. Moderate amount of mildly dense heterogeneous material within the urinary bladder lumen which may represent diluted contrast versus blood products. Correlation with urinalysis is recommended. 3. Stable, asymmetric moderate severity urinary bladder wall thickening. Sequelae associated with an underlying neoplastic process cannot be excluded 4. Evidence of prior total colectomy with subsequent right lower quadrant ostomy site. 5. Aortic atherosclerosis."  Patient was given midodrine , vancomycin , meropenem , and 1 L IV fluid boluses.  TRH called to admit.  Patient is currently somnolent but easily able to wake up and answer questions.  He does not remember how he passed out at home after dialysis.  Does not recall feeling dizzy or lightheaded.  Denies chest pain or shortness of breath.  Denies fevers, cough, nausea, vomiting, or abdominal pain.  Denies any black-colored or bloody stools.  Denies vomiting blood.  Patient states he still makes a small amount of urine and has not noticed any blood in it.  He reports history of chronic hypotension for which he takes midodrine  3 times a day.  Review of Systems:  Review of Systems  All other systems reviewed and are negative.   Past Medical History:  Diagnosis Date   A-fib (HCC)    Anemia    Arthritis     Cancer (HCC)    Basal cell   COVID-19    2021   Dysrhythmia    Afib-controlled on eliquis    ESRD (end stage renal disease) (HCC) 10/22/2021   Glaucoma 11/18/2021   History of DVT (deep vein thrombosis)    Hydronephrosis    managed wtih a PCN   Idiopathic neuropathy 10/22/2021   lyrica     Ileostomy in place [facility])    Obstructive uropathy    With chronic left nephrostomy   Old retinal detachment, total or subtotal    Orthostatic hypotension 10/22/2021   Sleep apnea    does not need a machine   Stroke (HCC)    Ulcerative colitis (HCC)    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    Past Surgical History:  Procedure Laterality Date   BASAL CELL CARCINOMA EXCISION     10/23   COLON SURGERY     creation of j pouch     and subsequent takedown of j pouch   EYE SURGERY     IR NEPHROSTOMY EXCHANGE LEFT  12/10/2021   IR NEPHROSTOMY EXCHANGE LEFT  04/22/2022   IR NEPHROSTOMY EXCHANGE LEFT  07/29/2022   IR NEPHROSTOMY EXCHANGE LEFT  10/28/2022   IR NEPHROSTOMY EXCHANGE LEFT  02/05/2023   IR NEPHROSTOMY EXCHANGE LEFT  05/07/2023   IR NEPHROSTOMY EXCHANGE LEFT  06/25/2023   IR NEPHROSTOMY EXCHANGE LEFT  07/17/2023   IR NEPHROSTOMY EXCHANGE LEFT  10/22/2023   IR RADIOLOGIST EVAL & MGMT  11/25/2023   LEFT HEART CATH AND CORONARY ANGIOGRAPHY N/A 07/22/2022   Procedure: LEFT HEART CATH AND CORONARY ANGIOGRAPHY;  Surgeon: [place]az[place], Peter M, MD;  Location: [facility] INVASIVE CV LAB;  Service: Cardiovascular;  Laterality: N/A;   REVISION OF ARTERIOVENOUS GORETEX GRAFT Left 05/06/2022   Procedure: REDO LEFT THIGH ARTERIOVENOUS 4-7 MM GORETEX GRAFT;  Surgeon: Dannis Dy, MD;  Location: [facility] OR;  Service: Vascular;  Laterality: Left;   SMALL INTESTINE SURGERY     TOTAL COLECTOMY       reports that he quit smoking about 40 years ago. His smoking use included cigarettes. He started smoking about 46 years ago. He has a 12 pack-year smoking history. He has never been exposed to tobacco  smoke. He has never used smokeless tobacco. He reports current alcohol use of about 5.0 standard drinks of alcohol per week. He reports that he does not use drugs.  Allergies  Allergen Reactions   Baclofen Other (See Comments)    Altered mental status, after accidental overdose     Cephalosporins Rash   Ciprofloxacin Itching and Rash    Family History  Problem Relation Age of Onset   Stroke Mother    Cancer Father    Esophageal cancer Brother     Prior to Admission medications   Medication Sig Start Date End Date Taking? Authorizing Provider  ertapenem  (INVANZ ) IVPB Inject 1 g into the vein every Monday, Wednesday, and Friday with hemodialysis for 19 days. **To be given at dialysis** Indication: Sacral osteomyelitis  First Dose: Yes  Last Day of Therapy:  5/28 Labs - Once weekly:  CBC/D and BMP, Labs - Once weekly: ESR and CRP Method of administration: Mini-Bag Plus / Gravity Method of administration may be changed at  the discretion of home infusion pharmacist based upon assessment of the patient and/or caregiver's ability to self-administer the medication ordered. 11/13/23 12/02/23 Yes Armenta Landau, MD  midodrine  (PROAMATINE ) 5 MG tablet Take 3 tablets (15 mg total) by mouth 3 (three) times daily. 11/13/23  Yes Armenta Landau, MD  vancomycin  IVPB Inject 750 mg into the vein every Monday, Wednesday, and Friday with hemodialysis for 19 days. **To be given at dialysis** Indication: Sacral osteomyelitis  First Dose: Yes  Last Day of Therapy:  5/28 Labs - Once weekly:  CBC/D and BMP, Labs - Once weekly: ESR and CRP Method of administration: Mini-Bag Plus / Gravity Method of administration may be changed at the discretion of home infusion pharmacist based upon assessment of the patient and/or caregiver's ability to self-administer the medication ordered. 11/13/23 12/02/23 Yes Armenta Landau, MD  albuterol  (PROVENTIL ) (2.5 MG/3ML) 0.083% nebulizer solution Take 2.5 mg by  nebulization every 6 (six) hours as needed for wheezing or shortness of breath.    [provider]  albuterol  (VENTOLIN  HFA) 108 (90 Base) MCG/ACT inhaler Inhale 1-2 puffs into the lungs every 6 (six) hours as needed for wheezing or shortness of breath.    [provider]  atorvastatin  (LIPITOR) 20 MG tablet Take 1 tablet (20 mg total) by mouth daily. 11/14/23   Armenta Landau, MD  budesonide  (PULMICORT ) 0.5 MG/2ML nebulizer solution Take 0.5 mg by nebulization 2 (two) times daily as needed (for respiratory flares).    [provider]  Cyanocobalamin (VITAMIN B-12 PO) Take 1 capsule by mouth in the morning and at bedtime.    [provider]  dorzolamide  (TRUSOPT ) 2 % ophthalmic solution Place 1 drop into the right eye at bedtime. 06/17/23   [provider]  ELIQUIS  2.5 MG TABS tablet Take 1 tablet (2.5 mg total) by mouth 2 (two) times daily. 08/07/22   Sonny Dust, MD  ertapenem  1 g in sodium chloride  0.9 % 100 mL Inject 1 g into the vein daily for 19 days. 11/13/23 12/02/23  Armenta Landau, MD  folic acid  (FOLVITE ) 1 MG tablet Take 1 tablet (1 mg total) by mouth daily. 07/21/23   Regalado, Belkys A, MD  gabapentin  (NEURONTIN ) 300 MG capsule Take 300 mg by mouth at bedtime. 09/09/23   [provider]  loperamide  (IMODIUM ) 2 MG capsule Take 1 capsule (2 mg total) by mouth as needed for diarrhea or loose stools. 11/13/23   Armenta Landau, MD  mometasone -formoterol  (DULERA ) 200-5 MCG/ACT AERO Inhale 2 puffs into the lungs 2 (two) times daily. 09/08/23   Doroteo Gasmen, MD  nitroGLYCERIN  (NITROSTAT ) 0.4 MG SL tablet Place 1 tablet (0.4 mg total) under the tongue every 5 (five) minutes as needed for chest pain. 11/13/23   Armenta Landau, MD  Omega Fatty Acids-Vitamins (OMEGA-3 GUMMIES) CHEW Chew 1 tablet by mouth in the morning and at bedtime.    [provider]  pantoprazole  (PROTONIX ) 40 MG tablet Take 1 tablet (40 mg total) by  mouth 2 (two) times daily. 11/13/23   Armenta Landau, MD  ramelteon  (ROZEREM ) 8 MG tablet Take 1 tablet (8 mg total) by mouth at bedtime. Patient taking differently: Take 8 mg by mouth at bedtime as needed for sleep. 10/27/23   Almira Jaeger, MD  sertraline  (ZOLOFT ) 50 MG tablet Take 25 mg by mouth at bedtime.    [provider]  sevelamer  carbonate (RENVELA ) 800 MG tablet Take 1,600 mg by mouth  3 (three) times daily with meals.    [provider]  Sodium Chloride  Flush (SALINE FLUSH) 0.9 % SOLN Use 5 mLs by Intracatheter route daily as directed. 11/25/23   Caperilla, Marissa N, PA  timolol  (TIMOPTIC ) 0.5 % ophthalmic solution Place 1 drop into both eyes in the morning and at bedtime.    [provider]  traMADol  (ULTRAM ) 50 MG tablet Take 1 tablet (50 mg total) by mouth every 8 (eight) hours as needed (for pain). 11/13/23   Armenta Landau, MD  TYLENOL  325 MG tablet Take 325-650 mg by mouth every 6 (six) hours as needed for mild pain (pain score 1-3) or headache.    [provider]    Physical Exam: Vitals:   11/27/23 2304 11/27/23 2345 11/28/23 0045 11/28/23 0145  BP:  (!) 83/50 (!) 85/55 (!) 84/57  Pulse:  69 91 85  Resp:  14 (!) 22 16  Temp: 98 F (36.7 C)     TempSrc: Oral     SpO2:  98% 98% 96%    Physical Exam Vitals reviewed.  Constitutional:      General: He is not in acute distress. HENT:     Head: Normocephalic and atraumatic.  Eyes:     Extraocular Movements: Extraocular movements intact.  Cardiovascular:     Rate and Rhythm: Normal rate and regular rhythm.     Pulses: Normal pulses.  Pulmonary:     Effort: Pulmonary effort is normal. No respiratory distress.     Breath sounds: Normal breath sounds. No wheezing or rales.  Abdominal:     General: Bowel sounds are normal. There is no distension.     Palpations: Abdomen is soft.     Tenderness: There is no abdominal tenderness. There is no guarding.  Musculoskeletal:      Cervical back: Normal range of motion.     Right lower leg: No edema.     Left lower leg: No edema.  Skin:    General: Skin is warm and dry.  Neurological:     General: No focal deficit present.     Mental Status: He is alert and oriented to person, place, and time.     Cranial Nerves: No cranial nerve deficit.     Sensory: No sensory deficit.     Motor: No weakness.     Labs on Admission: I have personally reviewed following labs and imaging studies  CBC: Recent Labs  Lab 11/27/23 1412  WBC 7.9  NEUTROABS 6.9  HGB 8.0*  HCT 25.2*  MCV 121.2*  PLT 171   Basic Metabolic Panel: Recent Labs  Lab 11/27/23 1412  NA 141  K 3.6  CL 99  CO2 30  GLUCOSE 108*  BUN 14  CREATININE 4.15*  CALCIUM  8.8*   GFR: Estimated Creatinine Clearance: 12.6 mL/min (A) (by C-G formula based on SCr of 4.15 mg/dL (H)). Liver Function Tests: Recent Labs  Lab 11/27/23 1412  AST 25  ALT 15  ALKPHOS 69  BILITOT 0.8  PROT 6.6  ALBUMIN  3.3*   No results for input(s): "LIPASE", "AMYLASE" in the last 168 hours. No results for input(s): "AMMONIA" in the last 168 hours. Coagulation Profile: Recent Labs  Lab 11/27/23 1412  INR 1.1   Cardiac Enzymes: No results for input(s): "CKTOTAL", "CKMB", "CKMBINDEX", "TROPONINI" in the last 168 hours. BNP (last 3 results) No results for input(s): "PROBNP" in the last 8760 hours. HbA1C: No results for input(s): "HGBA1C" in the last 72 hours. CBG:  Recent Labs  Lab 11/28/23 0223  GLUCAP 104*   Lipid Profile: No results for input(s): "CHOL", "HDL", "LDLCALC", "TRIG", "CHOLHDL", "LDLDIRECT" in the last 72 hours. Thyroid Function Tests: No results for input(s): "TSH", "T4TOTAL", "FREET4", "T3FREE", "THYROIDAB" in the last 72 hours. Anemia Panel: No results for input(s): "VITAMINB12", "FOLATE", "FERRITIN", "TIBC", "IRON", "RETICCTPCT" in the last 72 hours. Urine analysis:    Component Value Date/Time   COLORURINE STRAW (A) 07/09/2023 1221    APPEARANCEUR TURBID (A) 07/09/2023 1221   LABSPEC  07/09/2023 1221    TEST NOT REPORTED DUE TO COLOR INTERFERENCE OF URINE PIGMENT   PHURINE  07/09/2023 1221    TEST NOT REPORTED DUE TO COLOR INTERFERENCE OF URINE PIGMENT   GLUCOSEU (A) 07/09/2023 1221    TEST NOT REPORTED DUE TO COLOR INTERFERENCE OF URINE PIGMENT   HGBUR (A) 07/09/2023 1221    TEST NOT REPORTED DUE TO COLOR INTERFERENCE OF URINE PIGMENT   BILIRUBINUR (A) 07/09/2023 1221    TEST NOT REPORTED DUE TO COLOR INTERFERENCE OF URINE PIGMENT   KETONESUR (A) 07/09/2023 1221    TEST NOT REPORTED DUE TO COLOR INTERFERENCE OF URINE PIGMENT   PROTEINUR (A) 07/09/2023 1221    TEST NOT REPORTED DUE TO COLOR INTERFERENCE OF URINE PIGMENT   NITRITE (A) 07/09/2023 1221    TEST NOT REPORTED DUE TO COLOR INTERFERENCE OF URINE PIGMENT   LEUKOCYTESUR (A) 07/09/2023 1221    TEST NOT REPORTED DUE TO COLOR INTERFERENCE OF URINE PIGMENT    Radiological Exams on Admission: CT PELVIS W CONTRAST Result Date: 11/27/2023 CLINICAL DATA:  Suspected perianal abscess or fistula. EXAM: CT PELVIS WITH CONTRAST TECHNIQUE: Multidetector CT imaging of the pelvis was performed using the standard protocol following the bolus administration of intravenous contrast. RADIATION DOSE REDUCTION: This exam was performed according to the departmental dose-optimization program which includes automated exposure control, adjustment of the mA and/or kV according to patient size and/or use of iterative reconstruction technique. CONTRAST:  75mL OMNIPAQUE  IOHEXOL  350 MG/ML SOLN COMPARISON:  Nov 25, 2023 FINDINGS: Urinary Tract: Urinary bladder is poorly distended and subsequently limited in evaluation. A moderate amount of mildly dense heterogeneous material is seen within the bladder lumen (approximately 88.00 Hounsfield units). This represents a new finding when compared to the prior study. Stable, asymmetric moderate severity urinary bladder wall thickening is seen, most  prominent along the anterior and lateral aspect of the urinary bladder, left greater than right. Bowel: There is evidence of prior total colectomy. No dilated bowel loops are seen. Vascular/Lymphatic: There is marked severity calcification of the visualized portion of the abdominal aorta. This extends to involve the bilateral common iliac arteries and their branches. A patent left femoral loop dialysis graft is seen. Reproductive: There is stable, moderate to marked severity prostatomegaly. A penile pump is in place with its associated reservoir seen within the anterior aspect of the lower pelvis on the right. Other:  A right lower quadrant ostomy site is seen. Musculoskeletal: A left-sided transgluteal surgical drain is again seen. Its distal tip is seen within the presacral space and is surrounded by a moderate amount of stable soft tissue thickening and inflammatory fat stranding. No residual abscess is identified. Degenerative changes are seen within the visualized portion of the lower lumbar spine. No acute osseous abnormalities are identified. IMPRESSION: 1. Stable left-sided transgluteal surgical drain with its distal tip seen within the presacral space. No residual abscess is identified. 2. Moderate amount of mildly dense heterogeneous material within the urinary bladder  lumen which may represent diluted contrast versus blood products. Correlation with urinalysis is recommended. 3. Stable, asymmetric moderate severity urinary bladder wall thickening. Sequelae associated with an underlying neoplastic process cannot be excluded 4. Evidence of prior total colectomy with subsequent right lower quadrant ostomy site. 5. Aortic atherosclerosis. Electronically Signed   By: Virgle Grime M.D.   On: 11/27/2023 20:08   DG Chest Port 1 View Result Date: 11/27/2023 CLINICAL DATA:  Possible sepsis EXAM: PORTABLE CHEST 1 VIEW COMPARISON:  11/08/2023 FINDINGS: Cardiomegaly with mild atelectasis or scar at left base.  No acute airspace disease, pleural effusion or pneumothorax. Aortic atherosclerosis. Right-sided subclavian vascular stent. IMPRESSION: No active disease. Cardiomegaly. Electronically Signed   By: Esmeralda Hedge M.D.   On: 11/27/2023 16:15    EKG: Independently reviewed.  Sinus rhythm with first-degree AV block, ST elevations in leads III and aVF, ST depressions in leads I and aVL, right bundle branch block, QTc 491.  No significant change compared to EKGs from recent hospitalization 2 weeks ago.  Assessment and Plan  Hypotension and syncope Patient has history of chronic hypotension and is on midodrine .  SBP initially in the 80s with EMS.  Not hypoglycemic.  PE less likely given no tachycardia or hypoxia.  He was admitted for pericarditis 2 weeks ago and EKG showing similar changes.  Patient is not endorsing chest pain.  Recent echo done 11/10/2023 showing EF 55 to 60%, grade 1 diastolic dysfunction, moderate tricuspid valve regurgitation, mild aortic valve regurgitation, no aortic valve stenosis, and mild dilation of the ascending aorta.  Per ED RN, patient had intermittent episodes of becoming less responsive earlier but at the time of my evaluation, patient was somnolent but easily able to wake up and answer questions appropriately.  No focal neurologic deficit noted on exam.  Most recent blood pressure 92/59, MAP 69. -Stat CT head ordered -Cardiac monitoring -Stat troponin -Continue midodrine  15 mg 3 times daily -Check orthostatics, fall precautions  Mild lactic acidosis Likely due to dehydration.  No signs of sepsis.  Patient was given IV fluids in the ED, trend lactate.  Presacral fluid collection status post drain placement by IR on 11/11/2023 No fever or leukocytosis.  CT showing stable findings.  ID had previously recommended IV vancomycin  and IV ertapenem  to be given with dialysis until 12/02/2023.  Pharmacy aware and has ordered IV vancomycin  and IV meropenem .  Abnormal urinary bladder on  CT CT showing moderate amount of mildly dense heterogeneous material within the urinary bladder lumen which radiologist thinks may represent diluted contrast versus blood products.  Also showing stable, asymmetric moderate severity urinary bladder wall thickening; sequelae associated with an underlying neoplastic process cannot be excluded.  Patient reports still making a small amount of urine but has not noticed any hematuria.  He was bladder scanned 3 times in the ED and all resulting in 0 mL.  Hemoglobin currently 8.0, no significant drop compared to labs 2 weeks ago.  Hold Eliquis  tonight and follow-up repeat H&H in the morning.  He will need urology evaluation.  Chronic anemia Hemoglobin 8.0, previously in the 8-9 range on labs 2 weeks ago.  Type and screen, continue to monitor H&H.  Transfuse PRBCs if hemoglobin less than 7.  ESRD on HD MWF No indication for urgent dialysis overnight.  Consult nephrology in the morning.  Paroxysmal A-fib Holding Eliquis  tonight and monitoring H&H given concern for possible blood in the bladder per CT report.  Ulcerative colitis status post colectomy in 2011 with  ileostomy, ileal pouch, and anal anastomosis surgery in 2012 Ostomy care.  History of CVA Hyperlipidemia Depression Pharmacy med rec pending.  DVT prophylaxis: SCDs Code Status: Full Code (discussed with the patient) Family Communication: No family available at this time. Level of care: Progressive Care Unit Admission status: It is my clinical opinion that referral for OBSERVATION is reasonable and necessary in this patient based on the above information provided. The aforementioned taken together are felt to place the patient at high risk for further clinical deterioration. However, it is anticipated that the patient may be medically stable for discharge from the hospital within 24 to 48 hours.  Juliette Oh MD [facility]  If 7PM-7AM, please contact  night-coverage [email or web address]  11/28/2023, 2:38 AM

## 2023-11-28 NOTE — Progress Notes (Signed)
 IV team to bedside to obtain vascular access. Discussed appropriate vascular access options with MD Ansel Kingdom. LUA midline approved.

## 2023-11-28 NOTE — Progress Notes (Signed)
  Request seen to evaluate for possible replacement of drain.  This patient is familiar to IR.  He has chronic pelvic fluid collection-previous drain placed 07/13/23 and fell out 08/2023.   He then underwent left transgluteal drain placement with by Dr. Mabel Savage 11/11/23 due to a chronic pre sacral fluid collection.  Dr. Selinda Dales most recent notes reads = I think will be difficult to determine end-points of drain removal for Mr Slayton, as I doubt there are many surgical options, and this fluid has recurred at least 2-3 times. If chronic drainage is the only option, we could entertain a sclerotherapy session with betadine and/or ETOH, but this is certainly not a well established option.  His drain fell out last night while moving him (apparently after the CT).  Given there is no fluid collection present at this time, IR would not place another unless he develops another fluid collection.  NO need for IR procedure at this time.  Cristabel Bicknell S Benigna Delisi PA-C 11/28/2023 9:20 AM

## 2023-11-28 NOTE — Progress Notes (Signed)
 EEG complete - results pending

## 2023-11-28 NOTE — ED Provider Notes (Signed)
 Received signout; admitted for syncope.  Patient is pending CT scan as he does have chronic osteomyelitis with drain placement.  Send soft blood pressures, but on midodrine .  CT scan seemingly stable.  Case discussed with Dr. Nasario Badder for admission   Rolinda Climes, DO 11/28/23 0017

## 2023-11-28 NOTE — ED Notes (Signed)
 Updated patient daughter Joshua Riley regarding patient status.

## 2023-11-29 ENCOUNTER — Inpatient Hospital Stay (HOSPITAL_COMMUNITY)

## 2023-11-29 DIAGNOSIS — Z992 Dependence on renal dialysis: Secondary | ICD-10-CM

## 2023-11-29 DIAGNOSIS — I480 Paroxysmal atrial fibrillation: Secondary | ICD-10-CM

## 2023-11-29 DIAGNOSIS — N186 End stage renal disease: Secondary | ICD-10-CM | POA: Diagnosis not present

## 2023-11-29 DIAGNOSIS — R55 Syncope and collapse: Secondary | ICD-10-CM | POA: Diagnosis not present

## 2023-11-29 DIAGNOSIS — R569 Unspecified convulsions: Secondary | ICD-10-CM

## 2023-11-29 LAB — CBC
HCT: 23.4 % — ABNORMAL LOW (ref 39.0–52.0)
Hemoglobin: 7.5 g/dL — ABNORMAL LOW (ref 13.0–17.0)
MCH: 38.9 pg — ABNORMAL HIGH (ref 26.0–34.0)
MCHC: 32.1 g/dL (ref 30.0–36.0)
MCV: 121.2 fL — ABNORMAL HIGH (ref 80.0–100.0)
Platelets: 171 10*3/uL (ref 150–400)
RBC: 1.93 MIL/uL — ABNORMAL LOW (ref 4.22–5.81)
RDW: 17.2 % — ABNORMAL HIGH (ref 11.5–15.5)
WBC: 8.1 10*3/uL (ref 4.0–10.5)
nRBC: 0 % (ref 0.0–0.2)

## 2023-11-29 LAB — HEPATITIS B SURFACE ANTIGEN: Hepatitis B Surface Ag: NONREACTIVE

## 2023-11-29 LAB — COMPREHENSIVE METABOLIC PANEL WITH GFR
ALT: 16 U/L (ref 0–44)
AST: 23 U/L (ref 15–41)
Albumin: 3.1 g/dL — ABNORMAL LOW (ref 3.5–5.0)
Alkaline Phosphatase: 56 U/L (ref 38–126)
Anion gap: 12 (ref 5–15)
BUN: 21 mg/dL (ref 8–23)
CO2: 26 mmol/L (ref 22–32)
Calcium: 8.7 mg/dL — ABNORMAL LOW (ref 8.9–10.3)
Chloride: 101 mmol/L (ref 98–111)
Creatinine, Ser: 6.14 mg/dL — ABNORMAL HIGH (ref 0.61–1.24)
GFR, Estimated: 8 mL/min — ABNORMAL LOW (ref >60)
Glucose, Bld: 92 mg/dL (ref 70–99)
Potassium: 3.8 mmol/L (ref 3.5–5.1)
Sodium: 139 mmol/L (ref 135–145)
Total Bilirubin: 1.1 mg/dL (ref 0.0–1.2)
Total Protein: 5.8 g/dL — ABNORMAL LOW (ref 6.5–8.1)

## 2023-11-29 LAB — AMMONIA: Ammonia: 29 umol/L (ref 9–35)

## 2023-11-29 LAB — GLUCOSE, CAPILLARY: Glucose-Capillary: 85 mg/dL (ref 70–99)

## 2023-11-29 MED ORDER — FLUDROCORTISONE ACETATE 0.1 MG PO TABS
0.1000 mg | ORAL_TABLET | Freq: Every day | ORAL | Status: DC
  Filled 2023-11-29: qty 1

## 2023-11-29 MED ORDER — MIDODRINE HCL 5 MG PO TABS
20.0000 mg | ORAL_TABLET | Freq: Three times a day (TID) | ORAL | Status: DC
  Administered 2023-11-29: 20 mg via ORAL
  Filled 2023-11-29: qty 4

## 2023-11-29 MED ORDER — DARBEPOETIN ALFA 100 MCG/0.5ML IJ SOSY
100.0000 ug | PREFILLED_SYRINGE | INTRAMUSCULAR | Status: DC
  Administered 2023-11-30: 100 ug via SUBCUTANEOUS
  Filled 2023-11-29: qty 0.5

## 2023-11-29 MED ORDER — ATROPINE SULFATE 1 MG/10ML IJ SOSY: 0.5000 mg | PREFILLED_SYRINGE | Freq: Once | INTRAMUSCULAR | Status: DC | PRN

## 2023-11-29 MED ORDER — CHLORHEXIDINE GLUCONATE CLOTH 2 % EX PADS: 6.0000 | MEDICATED_PAD | Freq: Every day | CUTANEOUS | Status: DC

## 2023-11-29 MED ORDER — CALCITRIOL 0.5 MCG PO CAPS
0.5000 ug | ORAL_CAPSULE | ORAL | Status: DC
  Administered 2023-11-30 – 2023-12-02 (×2): 0.5 ug via ORAL
  Filled 2023-11-29 (×2): qty 1

## 2023-11-29 NOTE — Consult Note (Signed)
 WOC Nurse ostomy consult note; patient known to WOC team with longstanding ileostomy  Stoma type/location: ileostomy since 2011  Stomal assessment/size: not assessed  Peristomal assessment:  Treatment options for stomal/peristomal skin: 2" barrier ring  Output  Ostomy pouching: 1 piece Soft convex lawson #[id number], barrier ring  Timm Foot [id number] Education provided: none, longstanding ostomy.  OK for bedside nurse or patient to change pouch.  Enrolled patient in [employer] DC program: no  If skin denuded,weeping, broken down around stoma crust skin as follows:  Sprinkle stoma powder  Timm Foot #6) over any irritated skin, brush away excess powder Tap lightly over the powder with Cavilon No-Sting barrier wipe (skin barrier wipe-white and blue package) or gloved finger damp with water Allow to dry Apply another layer of powder following the same steps up to three layers.  After dry proceed with routine pouching    Discussed above with bedside nurse. WOC team will not follow this established ostomy. Re-consult if further needs arise.   Thank you,    Ronni Colace MSN, RN-BC, [employer] [phone]

## 2023-11-29 NOTE — Procedures (Signed)
 Routine EEG Report  Joshua Riley is a 88 y.o. male with a history of syncope who is undergoing an EEG to evaluate for seizures.  Report: This EEG was acquired with electrodes placed according to the International 10-20 electrode system (including Fp1, Fp2, F3, F4, C3, C4, P3, P4, O1, O2, T3, T4, T5, T6, A1, A2, Fz, Cz, Pz). The following electrodes were missing or displaced: none.  The best background was 8.5 Hz which displayed intermittent mild diffuse slowing. This activity is reactive to stimulation. Drowsiness was manifested by background fragmentation; deeper stages of sleep were identified by K complexes and sleep spindles. There was no focal slowing. There were no interictal epileptiform discharges. There were no electrographic seizures identified. Photic stimulation and hyperventilation were not performed.  Impression and clinical correlation: This EEG was obtained while awake and asleep and is abnormal due to intermittent mild diffuse slowing indicative of global cerebral dysfunction. Epileptiform abnormalities were not seen during this recording.  Greg Leaks, MD [facility] [phone]  If 7pm- 7am, please page neurology on call as listed in AMION.

## 2023-11-29 NOTE — Evaluation (Signed)
 Physical Therapy Evaluation Patient Details Name: Joshua Riley MRN: [id number] DOB: [birth year] Today's Date: 11/29/2023  History of Present Illness  Pt is 88 year old presented to [facility] on  11/27/23 for syncope and hypotension. Possible seizure activity noted. Neurology consulted and EEG negative and felt that the activity was more consistent with post syncopal convulsion. PMH - ESRD on HD, afib, CVA, depression, colectomy, ileostomy, chronic hypotension, DVT, acute pericarditis, groin abscess with lt transgluteal drain, nephrostomy tubes, chf, copd  Clinical Impression  Pt admitted with above diagnosis and presents to PT with functional limitations due to deficits listed below (See PT problem list). Pt needs skilled PT to maximize independence and safety. Pt typically is independent at home including driving himself to HD. Today with PT session pt with deficits in cognition and mobility. Orthostatic BP's were done and pt was not orthostatic. Cognitively he struggled with location. Joshua Riley he was in the hospital but then reported he spent a month in [facility] on [address]. When asked if he was sure there was a [facility] in [place] he said yes (there is not). When I asked him why he was up in [place] he said he lived there. Later in session he corrected himself and said he lived in [place]. He seems to use joking to mask some confusion. While standing EOB for BP pt's began to bob with knees flexing. When I said he should sit down, he said he was just joking and proceeded to stand for several more minutes. We got to the chair but did not amb due to I was unsure I could trust that the pt would alert me if he began having problems during gait. He reports he drives himself to HD which is another concern given his current cognition. At this time feel pt needs supervision at home due to cognition.          If plan is discharge home, recommend the following: Supervision due to cognitive  status;Assist for transportation;Assistance with cooking/housework   Can travel by private vehicle        Equipment Recommendations None recommended by PT  Recommendations for Other Services       Functional Status Assessment Patient has had a recent decline in their functional status and demonstrates the ability to make significant improvements in function in a reasonable and predictable amount of time.     Precautions / Restrictions Precautions Precautions: Fall Recall of Precautions/Restrictions: Impaired Precaution/Restrictions Comments: ileostomy, lt gluteal drain, nephrostomy tube Restrictions Weight Bearing Restrictions Per Provider Order: No      Mobility  Bed Mobility Overal bed mobility: Needs Assistance Bed Mobility: Supine to Sit     Supine to sit: Min assist, HOB elevated     General bed mobility comments: Assist to pull trunk up into sitting    Transfers Overall transfer level: Needs assistance Equipment used: Rolling walker (2 wheels) Transfers: Sit to/from Stand, Bed to chair/wheelchair/BSC Sit to Stand: Contact guard assist   Step pivot transfers: Contact guard assist       General transfer comment: Assist for safety    Ambulation/Gait               General Gait Details: Did not attempt without 2nd person around for safety due to unreliable cognition  Stairs            Wheelchair Mobility     Tilt Bed    Modified Rankin (Stroke Patients Only)       Balance  Overall balance assessment: Needs assistance Sitting-balance support: No upper extremity supported, Feet supported Sitting balance-Leahy Scale: Good     Standing balance support: Bilateral upper extremity supported, During functional activity Standing balance-Leahy Scale: Poor Standing balance comment: UE support and CGA for static standing                             Pertinent Vitals/Pain Pain Assessment Pain Assessment: No/denies pain    Home  Living Family/patient expects to be discharged to:: Private residence Living Arrangements: Alone Available Help at Discharge: Family;Available PRN/intermittently Type of Home: Apartment Home Access: Level entry       Home Layout: One level Home Equipment: Agricultural consultant (2 wheels)      Prior Function Prior Level of Function : Independent/Modified Independent;Driving             Mobility Comments: Independent with occasional use of rolling walker       Extremity/Trunk Assessment   Upper Extremity Assessment Upper Extremity Assessment: Defer to OT evaluation    Lower Extremity Assessment Lower Extremity Assessment: Generalized weakness       Communication   Communication Communication: Impaired Factors Affecting Communication: Hearing impaired    Cognition Arousal: Alert Behavior During Therapy: Impulsive   PT - Cognitive impairments: Orientation, Awareness, Safety/Judgement, Attention   Orientation impairments: Place                   PT - Cognition Comments: Pt with difficulty with location. Reported he spent a month in [facility] on [address]. When asked if he was sure there was a [facility] in [place] he said yes (there is not). When I asked him why he was up in [place] he said he lived there. Later in session he corrected himself and said he lived in [place]. Following commands: Intact       Cueing Cueing Techniques: Verbal cues, Tactile cues     General Comments General comments (skin integrity, edema, etc.): VSS. See flowsheet for orthostatics    Exercises     Assessment/Plan    PT Assessment Patient needs continued PT services  PT Problem List Decreased strength;Decreased balance;Decreased mobility;Decreased cognition;Decreased safety awareness       PT Treatment Interventions DME instruction;Gait training;Functional mobility training;Stair training;Therapeutic exercise;Therapeutic activities;Balance  training;Cognitive remediation;Patient/family education    PT Goals (Current goals can be found in the Care Plan section)  Acute Rehab PT Goals Patient Stated Goal: go home PT Goal Formulation: With patient Time For Goal Achievement: 12/13/23 Potential to Achieve Goals: Good    Frequency Min 2X/week     Co-evaluation               AM-PAC PT "6 Clicks" Mobility  Outcome Measure Help needed turning from your back to your side while in a flat bed without using bedrails?: None Help needed moving from lying on your back to sitting on the side of a flat bed without using bedrails?: A Little Help needed moving to and from a bed to a chair (including a wheelchair)?: A Little Help needed standing up from a chair using your arms (e.g., wheelchair or bedside chair)?: A Little Help needed to walk in hospital room?: A Little Help needed climbing 3-5 steps with a railing? : Total 6 Click Score: 17    End of Session   Activity Tolerance: Patient tolerated treatment well Patient left: in chair;with call bell/phone within reach;with chair alarm set  Nurse Communication: Mobility status PT Visit Diagnosis: Unsteadiness on feet (R26.81);Other abnormalities of gait and mobility (R26.89);History of falling (Z91.81);Muscle weakness (generalized) (M62.81)    Time: 8657-8469 PT Time Calculation (min) (ACUTE ONLY): 21 min   Charges:   PT Evaluation $PT Eval Moderate Complexity: 1 Mod   PT General Charges $$ ACUTE PT VISIT: 1 Visit         [facility] PT Acute Rehabilitation Services Office [phone]   Pura Browns [facility] 11/29/2023, 5:45 PM

## 2023-11-29 NOTE — Progress Notes (Signed)
 PROGRESS NOTE    Joshua Riley  WUJ:[id number] DOB: 11-23-1935 DOA: 11/27/2023 PCP: Almira Jaeger, MD   Brief Narrative: Joshua Riley is a 88 y.o. male with a history of ESRD on HD, atrial fibrillation on Eliquis , CVA, hyperlipidemia, depression, obstructive uropathy s/p left-sided percutaneous nephrostomy, ulcerative colitis s/p colectomy with ileostomy, ileal pouch and anal anastomosis, chronic hypotension on midodrine , anemia of chronic disease, sacral osteomyelitis on antibiotics.  Patient presented secondary to syncope with concern for hypotension mediated syncope. Hospitalization complicated by seizure-like activity.   Assessment/Plan:  Syncope Appears to be secondary to hypotension, however patient had seizure-like activity during admission. Patient placed on telemetry. Recent Transthoracic Echocardiogram significant for normal LVEF and no evidence of aortic stenosis.  Symptomatic hypotension Chronic hypotension. Patient is on midodrine  as an outpatient. Still with hypotension this admission. Prior workup for adrenal insufficiency appear to be not consistent with diagnosis. -Continue midodrine  -May start fludrocortisone today  Seizure-like activity Present after admission and witnessed by nursing staff, lasting 2 minutes with spontaneous cessation. Neurology consulted and recommended Keppra, MRI and EEG without formal consult. Neurology re-consulted for formal on 5/24 and declined to formally consult. -Follow-up MRI brain and EEG results  Lactic acidosis Lactic acid up to 5.1 on admission. In setting of hypotension, but also possibly related to possible seizure. Resolved.  ESRD on hemodialysis Nephrology consulted for management.  Presacral fluid collection Patient is s/p drain placement on 5/7, prior to this admission. During this admission, drain fell out during hospitalization. IR consulted with recommendation for no replacement secondary to no current fluid  collection noted on imaging.  Abnormal bladder CT Moderate amount of heterogenous material seen within the urinary bladder, possibly related to diluted contrast versus blood products. Additionally, CT notes bladder wall thickening, unable to exclude neoplastic process. Patient will need urology follow-up.  Paroxysmal atrial fibrillation Patient is on Eliquis  as an outpatient. Eliquis  held secondary to concern for possible blood products seen in bladder on CT pelvis. -Continue Eliquis   Ulcerative colitis Patient is s/p colectomy with ileostomy and ileal pouch and anal anastomosis. -Continue ostomy care  History of CVA Hyperlipidemia Patient is on Lipitor as an outpatient. He is also on Eliquis .  Depression Patient was previously on Zoloft  which appears to have been discontinued prior to admission.  Sacral osteomyelitis Patient is on Vancomycin  and Ertapenem  as an outpatient, with last day of therapy scheduled for 5/28.   DVT prophylaxis: Eliquis  Code Status:   Code Status: Full Code Family Communication: None at bedside. Daughter on telephone Disposition Plan: Discharge pending syncope and seizure-like activity workup   Consultants:  Neurology (declined)  Procedures:  None  Antimicrobials: Vancomycin  Meropenem     Subjective: No concerns this morning.   Objective: BP (!) 86/54 (BP Location: Right Arm)   Pulse 68   Temp 98 F (36.7 C) (Oral)   Resp 18   Ht 5\' 8"  (1.727 m)   Wt 71.8 kg   SpO2 100%   BMI 24.05 kg/m   Examination:  General exam: Appears calm and comfortable Respiratory system: Clear to auscultation. Respiratory effort normal. Cardiovascular system: S1 & S2 heard, RRR. Gastrointestinal system: Abdomen is nondistended, soft and nontender. Normal bowel sounds heard. Central nervous system: Alert and oriented to person and place. Musculoskeletal: No edema. No calf tenderness   Data Reviewed: I have personally reviewed following labs and imaging  studies   Last CBC Lab Results  Component Value Date   WBC 8.1 11/29/2023   HGB 7.5 (L) 11/29/2023  HCT 23.4 (L) 11/29/2023   MCV 121.2 (H) 11/29/2023   MCH 38.9 (H) 11/29/2023   RDW 17.2 (H) 11/29/2023   PLT 171 11/29/2023     Last metabolic panel Lab Results  Component Value Date   GLUCOSE 92 11/29/2023   NA 139 11/29/2023   K 3.8 11/29/2023   CL 101 11/29/2023   CO2 26 11/29/2023   BUN 21 11/29/2023   CREATININE 6.14 (H) 11/29/2023   GFRNONAA 8 (L) 11/29/2023   CALCIUM  8.7 (L) 11/29/2023   PHOS 3.8 11/13/2023   PROT 5.8 (L) 11/29/2023   ALBUMIN  3.1 (L) 11/29/2023   BILITOT 1.1 11/29/2023   ALKPHOS 56 11/29/2023   AST 23 11/29/2023   ALT 16 11/29/2023   ANIONGAP 12 11/29/2023     Creatinine Clearance: Estimated Creatinine Clearance: 8.5 mL/min (A) (by C-G formula based on SCr of 6.14 mg/dL (H)).  Recent Results (from the past 240 hours)  Resp panel by RT-PCR (RSV, Flu A&B, Covid) Anterior Nasal Swab     Status: None   Collection Time: 11/27/23  2:04 PM   Specimen: Anterior Nasal Swab  Result Value Ref Range Status   SARS Coronavirus 2 by RT PCR NEGATIVE NEGATIVE Final   Influenza A by PCR NEGATIVE NEGATIVE Final   Influenza B by PCR NEGATIVE NEGATIVE Final    Comment: (NOTE) The Xpert Xpress SARS-CoV-2/FLU/RSV plus assay is intended as an aid in the diagnosis of influenza from Nasopharyngeal swab specimens and should not be used as a sole basis for treatment. Nasal washings and aspirates are unacceptable for Xpert Xpress SARS-CoV-2/FLU/RSV testing.  Fact Sheet for Patients: BloggerCourse.com  Fact Sheet for Healthcare Providers: SeriousBroker.it  This test is not yet approved or cleared by the [place]  FDA and has been authorized for detection and/or diagnosis of SARS-CoV-2 by FDA under an Emergency Use Authorization (EUA). This EUA will remain in effect (meaning this test can be used) for the  duration of the COVID-19 declaration under Section 564(b)(1) of the Act, 21 U.S.C. section 360bbb-3(b)(1), unless the authorization is terminated or revoked.     Resp Syncytial Virus by PCR NEGATIVE NEGATIVE Final    Comment: (NOTE) Fact Sheet for Patients: BloggerCourse.com  Fact Sheet for Healthcare Providers: SeriousBroker.it  This test is not yet approved or cleared by the [place]  FDA and has been authorized for detection and/or diagnosis of SARS-CoV-2 by FDA under an Emergency Use Authorization (EUA). This EUA will remain in effect (meaning this test can be used) for the duration of the COVID-19 declaration under Section 564(b)(1) of the Act, 21 U.S.C. section 360bbb-3(b)(1), unless the authorization is terminated or revoked.  Performed at [facility], [address], [place], [place] [address]   Blood Culture (routine x 2)     Status: None (Preliminary result)   Collection Time: 11/27/23  2:12 PM   Specimen: BLOOD RIGHT FOREARM  Result Value Ref Range Status   Specimen Description BLOOD RIGHT FOREARM  Final   Special Requests   Final    BOTTLES DRAWN AEROBIC AND ANAEROBIC Blood Culture results may not be optimal due to an inadequate volume of blood received in culture bottles   Culture   Final    NO GROWTH 2 DAYS Performed at [facility], [address] [place], [place] [address]    Report Status PENDING  Incomplete  Blood Culture (routine x 2)     Status: None (Preliminary result)   Collection Time: 11/27/23  2:30 PM  Specimen: BLOOD  Result Value Ref Range Status   Specimen Description BLOOD RIGHT ANTECUBITAL  Final   Special Requests   Final    BOTTLES DRAWN AEROBIC AND ANAEROBIC Blood Culture adequate volume   Culture   Final    NO GROWTH 2 DAYS Performed at [facility], [address], [place], [place] [address]    Report Status PENDING  Incomplete      Radiology Studies: CT  HEAD WO CONTRAST ( ) Result Date: 11/28/2023 CLINICAL DATA:  Syncope/presyncope. EXAM: CT HEAD WITHOUT CONTRAST TECHNIQUE: Contiguous axial images were obtained from the base of the skull through the vertex without intravenous contrast. RADIATION DOSE REDUCTION: This exam was performed according to the departmental dose-optimization program which includes automated exposure control, adjustment of the mA and/or kV according to patient size and/or use of iterative reconstruction technique. COMPARISON:  06/02/2022 FINDINGS: Brain: No evidence of acute infarction, hemorrhage, hydrocephalus, extra-axial collection or mass lesion/mass effect. There is mild diffuse low-attenuation within the subcortical and periventricular white matter compatible with chronic microvascular disease. Prominence of the sulci and ventricles compatible with brain atrophy. Vascular: No hyperdense vessel or unexpected calcification. Skull: Normal. Negative for fracture or focal lesion. Sinuses/Orbits: Chronic appearing mucosal thickening within bilateral maxillary sinuses. No acute abnormality. Other: None IMPRESSION: 1. No acute intracranial abnormalities. 2. Chronic microvascular disease and brain atrophy. 3. Chronic appearing mucosal thickening within bilateral maxillary sinuses. Electronically Signed   By: Kimberley Penman M.D.   On: 11/28/2023 05:38   CT PELVIS W CONTRAST Result Date: 11/27/2023 CLINICAL DATA:  Suspected perianal abscess or fistula. EXAM: CT PELVIS WITH CONTRAST TECHNIQUE: Multidetector CT imaging of the pelvis was performed using the standard protocol following the bolus administration of intravenous contrast. RADIATION DOSE REDUCTION: This exam was performed according to the departmental dose-optimization program which includes automated exposure control, adjustment of the mA and/or kV according to patient size and/or use of iterative reconstruction technique. CONTRAST:  75mL OMNIPAQUE  IOHEXOL  350 MG/ML SOLN  COMPARISON:  Nov 25, 2023 FINDINGS: Urinary Tract: Urinary bladder is poorly distended and subsequently limited in evaluation. A moderate amount of mildly dense heterogeneous material is seen within the bladder lumen (approximately 88.00 Hounsfield units). This represents a new finding when compared to the prior study. Stable, asymmetric moderate severity urinary bladder wall thickening is seen, most prominent along the anterior and lateral aspect of the urinary bladder, left greater than right. Bowel: There is evidence of prior total colectomy. No dilated bowel loops are seen. Vascular/Lymphatic: There is marked severity calcification of the visualized portion of the abdominal aorta. This extends to involve the bilateral common iliac arteries and their branches. A patent left femoral loop dialysis graft is seen. Reproductive: There is stable, moderate to marked severity prostatomegaly. A penile pump is in place with its associated reservoir seen within the anterior aspect of the lower pelvis on the right. Other:  A right lower quadrant ostomy site is seen. Musculoskeletal: A left-sided transgluteal surgical drain is again seen. Its distal tip is seen within the presacral space and is surrounded by a moderate amount of stable soft tissue thickening and inflammatory fat stranding. No residual abscess is identified. Degenerative changes are seen within the visualized portion of the lower lumbar spine. No acute osseous abnormalities are identified. IMPRESSION: 1. Stable left-sided transgluteal surgical drain with its distal tip seen within the presacral space. No residual abscess is identified. 2. Moderate amount of mildly dense heterogeneous material within the urinary bladder lumen which may represent  diluted contrast versus blood products. Correlation with urinalysis is recommended. 3. Stable, asymmetric moderate severity urinary bladder wall thickening. Sequelae associated with an underlying neoplastic process  cannot be excluded 4. Evidence of prior total colectomy with subsequent right lower quadrant ostomy site. 5. Aortic atherosclerosis. Electronically Signed   By: Virgle Grime M.D.   On: 11/27/2023 20:08   DG Chest Port 1 View Result Date: 11/27/2023 CLINICAL DATA:  Possible sepsis EXAM: PORTABLE CHEST 1 VIEW COMPARISON:  11/08/2023 FINDINGS: Cardiomegaly with mild atelectasis or scar at left base. No acute airspace disease, pleural effusion or pneumothorax. Aortic atherosclerosis. Right-sided subclavian vascular stent. IMPRESSION: No active disease. Cardiomegaly. Electronically Signed   By: Esmeralda Hedge M.D.   On: 11/27/2023 16:15      LOS: 1 day    Aneita Keens, MD [facility] 11/29/2023, 9:55 AM   If 7PM-7AM, please contact night-coverage [email or web address]

## 2023-11-29 NOTE — Plan of Care (Signed)

## 2023-11-29 NOTE — Consult Note (Addendum)
 [facility] Renal Consultation Note    Indication for Consultation:  Management of ESRD/hemodialysis; anemia, hypertension/volume and secondary hyperparathyroidism  ZOX:WRUEAV, Saverio Curling, MD  HPI: Joshua Riley is a 88 y.o. male with ESRD on HD MWF at [facility]. His past medical history is significant for Afib (on Eliquis ), CVA, HLD, depression, obstructive uropathy (s/p left-sided percutaneous nephrostomy), ulcerative colitis (s/p colectomy with ileostomy), ileal pouch and anal anastomosis, chronic hypotension (on midodrine ), anemia of chronic disease, and sacral osteomyelitis (on ABXs).   Patient completed full HD on 5/23 and returned home. Noted patient was c/o dizziness/lightheadedness then had a syncopal episode witnessed by home health PT and RN. He was then sent to the ED for further evaluation. It appears on arrival, he was having intermittent confusion and remained hypotensive. He's on Midodrine  15mg  TID for chronic hypotension. Also noted he was showing seizure-like activity for 2 minutes. NS bolus and loading dose Keppra was given and Neurology was consulted. Patient is now admitted.  He tells me that his BP is always low but he has never felt presyncopal or passed out.  Was this most recent event sz related ?? Patient is intermittently confused but can give me good history in this regard  Noted patient has had around 4 hospitalizations over this past year. Recent [facility] admit 11/08/23-11/13/23 for ischial/anal fossa abscess and sacral osteomyelitis. S/p drain placement on 5/7. Blood cultures from 5/7 (+) ESBL ecoli and on IV ABXs until 12/02/23.   Seen and examined patient at bedside. He remains on RA and denies SOB, CP, palpitations, and N/V. Blood pressures however are still low 81/49. CXR from 5/23 showed no active disease. MRI brain negative for an acute or reversible finding.  Current labs include: Na 139, K+ 3.8, BUN 21, Ca 8.7, Alb 3.1, and Hgb 7.5. Plan for HD  on 5/26 per his routine schedule.   Past Medical History:  Diagnosis Date   A-fib (HCC)    Anemia    Arthritis    Cancer (HCC)    Basal cell   COVID-19    2021   Dysrhythmia    Afib-controlled on eliquis    ESRD (end stage renal disease) (HCC) 10/22/2021   Glaucoma 11/18/2021   History of DVT (deep vein thrombosis)    Hydronephrosis    managed wtih a PCN   Idiopathic neuropathy 10/22/2021   lyrica     Ileostomy in place [facility])    Obstructive uropathy    With chronic left nephrostomy   Old retinal detachment, total or subtotal    Orthostatic hypotension 10/22/2021   Sleep apnea    does not need a machine   Stroke (HCC)    Ulcerative colitis (HCC)    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   Past Surgical History:  Procedure Laterality Date   BASAL CELL CARCINOMA EXCISION     10/23   COLON SURGERY     creation of j pouch     and subsequent takedown of j pouch   EYE SURGERY     IR NEPHROSTOMY EXCHANGE LEFT  12/10/2021   IR NEPHROSTOMY EXCHANGE LEFT  04/22/2022   IR NEPHROSTOMY EXCHANGE LEFT  07/29/2022   IR NEPHROSTOMY EXCHANGE LEFT  10/28/2022   IR NEPHROSTOMY EXCHANGE LEFT  02/05/2023   IR NEPHROSTOMY EXCHANGE LEFT  05/07/2023   IR NEPHROSTOMY EXCHANGE LEFT  06/25/2023   IR NEPHROSTOMY EXCHANGE LEFT  07/17/2023   IR NEPHROSTOMY EXCHANGE LEFT  10/22/2023   IR RADIOLOGIST  EVAL & MGMT  11/25/2023   LEFT HEART CATH AND CORONARY ANGIOGRAPHY N/A 07/22/2022   Procedure: LEFT HEART CATH AND CORONARY ANGIOGRAPHY;  Surgeon: [place]az[place], Peter M, MD;  Location: [facility] INVASIVE CV LAB;  Service: Cardiovascular;  Laterality: N/A;   REVISION OF ARTERIOVENOUS GORETEX GRAFT Left 05/06/2022   Procedure: REDO LEFT THIGH ARTERIOVENOUS 4-7 MM GORETEX GRAFT;  Surgeon: Dannis Dy, MD;  Location: [facility] OR;  Service: Vascular;  Laterality: Left;   SMALL INTESTINE SURGERY     TOTAL COLECTOMY     Family History  Problem Relation Age of Onset   Stroke Mother    Cancer Father     Esophageal cancer Brother    Social History:  reports that he quit smoking about 40 years ago. His smoking use included cigarettes. He started smoking about 46 years ago. He has a 12 pack-year smoking history. He has never been exposed to tobacco smoke. He has never used smokeless tobacco. He reports current alcohol use of about 5.0 standard drinks of alcohol per week. He reports that he does not use drugs. Allergies  Allergen Reactions   Baclofen Other (See Comments)    Altered mental status, after accidental overdose     Cephalosporins Rash   Ciprofloxacin Itching and Rash   Prior to Admission medications   Medication Sig Start Date End Date Taking? Authorizing Provider  albuterol  (PROVENTIL ) (2.5 MG/3ML) 0.083% nebulizer solution Take 2.5 mg by nebulization every 6 (six) hours as needed for wheezing or shortness of breath.   Yes [provider]  albuterol  (VENTOLIN  HFA) 108 (90 Base) MCG/ACT inhaler Inhale 1-2 puffs into the lungs every 6 (six) hours as needed for wheezing or shortness of breath.   Yes [provider]  atorvastatin  (LIPITOR) 20 MG tablet Take 1 tablet (20 mg total) by mouth daily. 11/14/23  Yes Armenta Landau, MD  budesonide  (PULMICORT ) 0.5 MG/2ML nebulizer solution Take 0.5 mg by nebulization 2 (two) times daily as needed (for respiratory flares).   Yes [provider]  Cyanocobalamin (VITAMIN B-12 PO) Take 1 capsule by mouth in the morning and at bedtime.   Yes [provider]  dorzolamide  (TRUSOPT ) 2 % ophthalmic solution Place 1 drop into the right eye at bedtime. 06/17/23  Yes [provider]  ELIQUIS  2.5 MG TABS tablet Take 1 tablet (2.5 mg total) by mouth 2 (two) times daily. 08/07/22  Yes Sonny Dust, MD  ertapenem  (INVANZ ) IVPB Inject 1 g into the vein every Monday, Wednesday, and Friday with hemodialysis for 19 days. **To be given at dialysis** Indication: Sacral osteomyelitis  First Dose: Yes  Last Day  of Therapy:  5/28 Labs - Once weekly:  CBC/D and BMP, Labs - Once weekly: ESR and CRP Method of administration: Mini-Bag Plus / Gravity Method of administration may be changed at the discretion of home infusion pharmacist based upon assessment of the patient and/or caregiver's ability to self-administer the medication ordered. 11/13/23 12/02/23 Yes Armenta Landau, MD  ertapenem  1 g in sodium chloride  0.9 % 100 mL Inject 1 g into the vein daily for 19 days. 11/13/23 12/02/23 Yes Armenta Landau, MD  folic acid  (FOLVITE ) 1 MG tablet Take 1 tablet (1 mg total) by mouth daily. 07/21/23  Yes Regalado, Belkys A, MD  gabapentin  (NEURONTIN ) 300 MG capsule Take 300 mg by mouth at bedtime. 09/09/23  Yes [provider]  loperamide  (IMODIUM ) 2 MG capsule Take 1 capsule (2 mg total) by mouth as needed for  diarrhea or loose stools. 11/13/23  Yes Armenta Landau, MD  midodrine  (PROAMATINE ) 5 MG tablet Take 3 tablets (15 mg total) by mouth 3 (three) times daily. 11/13/23  Yes Armenta Landau, MD  mometasone -formoterol  (DULERA ) 200-5 MCG/ACT AERO Inhale 2 puffs into the lungs 2 (two) times daily. 09/08/23  Yes Fonnie Iba I, MD  multivitamin (RENA-VIT) TABS tablet Take 1 tablet by mouth daily.   Yes [provider]  nitroGLYCERIN  (NITROSTAT ) 0.4 MG SL tablet Place 1 tablet (0.4 mg total) under the tongue every 5 (five) minutes as needed for chest pain. 11/13/23  Yes Armenta Landau, MD  pantoprazole  (PROTONIX ) 40 MG tablet Take 1 tablet (40 mg total) by mouth 2 (two) times daily. Patient taking differently: Take 40 mg by mouth daily as needed (indigestion). 11/13/23  Yes Armenta Landau, MD  ramelteon  (ROZEREM ) 8 MG tablet Take 1 tablet (8 mg total) by mouth at bedtime. Patient taking differently: Take 8 mg by mouth at bedtime as needed for sleep. 10/27/23  Yes Almira Jaeger, MD  sevelamer  carbonate (RENVELA ) 800 MG tablet Take 1,600 mg by mouth 3 (three) times daily with meals.   Yes  [provider]  Sodium Chloride  Flush (SALINE FLUSH) 0.9 % SOLN Use 5 mLs by Intracatheter route daily as directed. 11/25/23  Yes Caperilla, Marissa N, PA  timolol  (TIMOPTIC ) 0.5 % ophthalmic solution Place 1 drop into both eyes in the morning and at bedtime.   Yes [provider]  traMADol  (ULTRAM ) 50 MG tablet Take 1 tablet (50 mg total) by mouth every 8 (eight) hours as needed (for pain). Patient taking differently: Take 50 mg by mouth every 8 (eight) hours as needed for moderate pain (pain score 4-6) or severe pain (pain score 7-10). 11/13/23  Yes Armenta Landau, MD  TYLENOL  325 MG tablet Take 325-650 mg by mouth every 6 (six) hours as needed for mild pain (pain score 1-3) or headache.   Yes [provider]  vancomycin  IVPB Inject 750 mg into the vein every Monday, Wednesday, and Friday with hemodialysis for 19 days. **To be given at dialysis** Indication: Sacral osteomyelitis  First Dose: Yes  Last Day of Therapy:  5/28 Labs - Once weekly:  CBC/D and BMP, Labs - Once weekly: ESR and CRP Method of administration: Mini-Bag Plus / Gravity Method of administration may be changed at the discretion of home infusion pharmacist based upon assessment of the patient and/or caregiver's ability to self-administer the medication ordered. 11/13/23 12/02/23 Yes Armenta Landau, MD  Omega Fatty Acids-Vitamins (OMEGA-3 GUMMIES) CHEW Chew 1 tablet by mouth in the morning and at bedtime. Patient not taking: Reported on 11/28/2023    [provider]  sertraline  (ZOLOFT ) 50 MG tablet Take 25 mg by mouth at bedtime. Patient not taking: Reported on 11/28/2023    [provider]   Current Facility-Administered Medications  Medication Dose Route Frequency Provider Last Rate Last Admin   acetaminophen  (TYLENOL ) tablet 650 mg  650 mg Oral Q6H PRN Juliette Oh, MD       Or   acetaminophen  (TYLENOL ) suppository 650 mg  650 mg Rectal Q6H PRN Juliette Oh, MD        apixaban  (ELIQUIS ) tablet 2.5 mg  2.5 mg Oral BID Verlyn Goad, MD   2.5 mg at 11/29/23 1050   atorvastatin  (LIPITOR) tablet 20 mg  20 mg Oral Daily Verlyn Goad, MD   20 mg at 11/29/23 1050   atropine 1  MG/10ML injection 0.5 mg  0.5 mg Intravenous Once PRN Mansy, Jan A, MD       LORazepam (ATIVAN) injection 2 mg  2 mg Intravenous Q6H PRN Ulice Gamer, Subrina, MD       meropenem  (MERREM ) 1 g in sodium chloride  0.9 % 100 mL IVPB  1 g Intravenous Daily Juliette Oh, MD 200 mL/hr at 11/29/23 1056 1 g at 11/29/23 1056   midodrine  (PROAMATINE ) tablet 15 mg  15 mg Oral TID WC Juliette Oh, MD   15 mg at 11/29/23 1231   timolol  (TIMOPTIC ) 0.5 % ophthalmic solution 1 drop  1 drop Both Eyes BID Verlyn Goad, MD   1 drop at 11/29/23 1058   [START ON 11/30/2023] vancomycin  (VANCOREADY) IVPB 750 mg/150 mL  750 mg Intravenous Q M,W,F-HD Juliette Oh, MD       Labs: Basic Metabolic Panel: Recent Labs  Lab 11/27/23 1412 11/28/23 0420 11/29/23 0230  NA 141 140 139  K 3.6 4.2 3.8  CL 99 100 101  CO2 30 24 26   GLUCOSE 108* 120* 92  BUN 14 16 21   CREATININE 4.15* 5.12* 6.14*  CALCIUM  8.8* 8.7* 8.7*   Liver Function Tests: Recent Labs  Lab 11/27/23 1412 11/29/23 0230  AST 25 23  ALT 15 16  ALKPHOS 69 56  BILITOT 0.8 1.1  PROT 6.6 5.8*  ALBUMIN  3.3* 3.1*   No results for input(s): "LIPASE", "AMYLASE" in the last 168 hours. No results for input(s): "AMMONIA" in the last 168 hours. CBC: Recent Labs  Lab 11/27/23 1412 11/28/23 0420 11/29/23 0230  WBC 7.9 7.1 8.1  NEUTROABS 6.9  --   --   HGB 8.0* 8.0* 7.5*  HCT 25.2* 26.1* 23.4*  MCV 121.2* 126.1* 121.2*  PLT 171 150 171   Cardiac Enzymes: No results for input(s): "CKTOTAL", "CKMB", "CKMBINDEX", "TROPONINI" in the last 168 hours. CBG: Recent Labs  Lab 11/28/23 0223 11/28/23 0635 11/29/23 0120  GLUCAP 104* 92 85   Iron Studies: No results for input(s): "IRON", "TIBC", "TRANSFERRIN", "FERRITIN" in the last  72 hours. Studies/Results: MR BRAIN WO CONTRAST Result Date: 11/29/2023 CLINICAL DATA:  Seizure. EXAM: MRI HEAD WITHOUT CONTRAST TECHNIQUE: Multiplanar, multiecho pulse sequences of the brain and surrounding structures were obtained without intravenous contrast. COMPARISON:  Head CT from yesterday FINDINGS: Brain: No acute infarction, hemorrhage, hydrocephalus, extra-axial collection or mass lesion. Generalized brain atrophy, subjective mild for age. Chronic small vessel ischemia to a mild degree in the periventricular white matter. Mild generalized atrophy. No chronic blood products or focal cortical abnormality to correlate with seizure history. Incomplete study, no coronal T2 weighted sequences. Vascular: Major flow voids are preserved. Skull and upper cervical spine: No focal marrow lesion. Upper cervical facet ankylosis. Sinuses/Orbits: Mild generalized mucosal thickening in paranasal sinuses and maxillary sinuses. Negative orbits. IMPRESSION: Aging brain without acute or reversible finding. No focal correlate for seizure history. Pervasive motion artifact with mild exam truncation Electronically Signed   By: Ronnette Coke M.D.   On: 11/29/2023 10:37   EEG adult Result Date: 11/28/2023 Eleni Griffin, MD     11/29/2023 12:30 PM Routine EEG Report Zenas Santa is a 88 y.o. male with a history of syncope who is undergoing an EEG to evaluate for seizures. Report: This EEG was acquired with electrodes placed according to the International 10-20 electrode system (including Fp1, Fp2, F3, F4, C3, C4, P3, P4, O1, O2, T3, T4, T5, T6, A1, A2, Fz, Cz, Pz). The following electrodes were missing or  displaced: none. The best background was 8.5 Hz which displayed intermittent mild diffuse slowing. This activity is reactive to stimulation. Drowsiness was manifested by background fragmentation; deeper stages of sleep were identified by K complexes and sleep spindles. There was no focal slowing. There were no  interictal epileptiform discharges. There were no electrographic seizures identified. Photic stimulation and hyperventilation were not performed. Impression and clinical correlation: This EEG was obtained while awake and asleep and is abnormal due to intermittent mild diffuse slowing indicative of global cerebral dysfunction. Epileptiform abnormalities were not seen during this recording. Greg Leaks, MD [facility] [phone] If 7pm- 7am, please page neurology on call as listed in AMION.   CT HEAD WO CONTRAST ( ) Result Date: 11/28/2023 CLINICAL DATA:  Syncope/presyncope. EXAM: CT HEAD WITHOUT CONTRAST TECHNIQUE: Contiguous axial images were obtained from the base of the skull through the vertex without intravenous contrast. RADIATION DOSE REDUCTION: This exam was performed according to the departmental dose-optimization program which includes automated exposure control, adjustment of the mA and/or kV according to patient size and/or use of iterative reconstruction technique. COMPARISON:  06/02/2022 FINDINGS: Brain: No evidence of acute infarction, hemorrhage, hydrocephalus, extra-axial collection or mass lesion/mass effect. There is mild diffuse low-attenuation within the subcortical and periventricular white matter compatible with chronic microvascular disease. Prominence of the sulci and ventricles compatible with brain atrophy. Vascular: No hyperdense vessel or unexpected calcification. Skull: Normal. Negative for fracture or focal lesion. Sinuses/Orbits: Chronic appearing mucosal thickening within bilateral maxillary sinuses. No acute abnormality. Other: None IMPRESSION: 1. No acute intracranial abnormalities. 2. Chronic microvascular disease and brain atrophy. 3. Chronic appearing mucosal thickening within bilateral maxillary sinuses. Electronically Signed   By: Kimberley Penman M.D.   On: 11/28/2023 05:38   CT PELVIS W CONTRAST Result Date: 11/27/2023 CLINICAL DATA:  Suspected perianal  abscess or fistula. EXAM: CT PELVIS WITH CONTRAST TECHNIQUE: Multidetector CT imaging of the pelvis was performed using the standard protocol following the bolus administration of intravenous contrast. RADIATION DOSE REDUCTION: This exam was performed according to the departmental dose-optimization program which includes automated exposure control, adjustment of the mA and/or kV according to patient size and/or use of iterative reconstruction technique. CONTRAST:  75mL OMNIPAQUE  IOHEXOL  350 MG/ML SOLN COMPARISON:  Nov 25, 2023 FINDINGS: Urinary Tract: Urinary bladder is poorly distended and subsequently limited in evaluation. A moderate amount of mildly dense heterogeneous material is seen within the bladder lumen (approximately 88.00 Hounsfield units). This represents a new finding when compared to the prior study. Stable, asymmetric moderate severity urinary bladder wall thickening is seen, most prominent along the anterior and lateral aspect of the urinary bladder, left greater than right. Bowel: There is evidence of prior total colectomy. No dilated bowel loops are seen. Vascular/Lymphatic: There is marked severity calcification of the visualized portion of the abdominal aorta. This extends to involve the bilateral common iliac arteries and their branches. A patent left femoral loop dialysis graft is seen. Reproductive: There is stable, moderate to marked severity prostatomegaly. A penile pump is in place with its associated reservoir seen within the anterior aspect of the lower pelvis on the right. Other:  A right lower quadrant ostomy site is seen. Musculoskeletal: A left-sided transgluteal surgical drain is again seen. Its distal tip is seen within the presacral space and is surrounded by a moderate amount of stable soft tissue thickening and inflammatory fat stranding. No residual abscess is identified. Degenerative changes are seen within the visualized portion of the lower lumbar spine. No acute osseous  abnormalities are identified. IMPRESSION: 1. Stable left-sided transgluteal surgical drain with its distal tip seen within the presacral space. No residual abscess is identified. 2. Moderate amount of mildly dense heterogeneous material within the urinary bladder lumen which may represent diluted contrast versus blood products. Correlation with urinalysis is recommended. 3. Stable, asymmetric moderate severity urinary bladder wall thickening. Sequelae associated with an underlying neoplastic process cannot be excluded 4. Evidence of prior total colectomy with subsequent right lower quadrant ostomy site. 5. Aortic atherosclerosis. Electronically Signed   By: Virgle Grime M.D.   On: 11/27/2023 20:08   DG Chest Port 1 View Result Date: 11/27/2023 CLINICAL DATA:  Possible sepsis EXAM: PORTABLE CHEST 1 VIEW COMPARISON:  11/08/2023 FINDINGS: Cardiomegaly with mild atelectasis or scar at left base. No acute airspace disease, pleural effusion or pneumothorax. Aortic atherosclerosis. Right-sided subclavian vascular stent. IMPRESSION: No active disease. Cardiomegaly. Electronically Signed   By: Esmeralda Hedge M.D.   On: 11/27/2023 16:15    ROS: All others negative except those listed in HPI.   Physical Exam: Vitals:   11/29/23 1040 11/29/23 1141 11/29/23 1209 11/29/23 1236  BP: (!) 99/51 (!) 86/58 (!) 81/49 (!) 108/56  Pulse: 65   65  Resp: 18 13 14 15   Temp: 98.1 F (36.7 C)     TempSrc: Oral     SpO2: 100%   100%  Weight:      Height:         General: Elderly male; chronically ill-appearing; NAD Head: Sclera not icteric Lungs: Lungs diminished anteriorly; Could not appreciate wheezing, rales or rhonchi. Breathing is unlabored. Heart: RRR. No murmur, rubs or gallops.  Abdomen: soft, nontender, +BS, no guarding, no rebound tenderness Lower extremities: no edema LE edema Neuro: Oriented to himself and location; No focal deficits noted. Dialysis Access: L thigh AVG  Dialysis Orders:  MWF -  [facility] 2hrs66min, BFR 350, DFR Auto 1.5,  EDW 70kg, 2K/ 2.5Ca Heparin  None ordered Mircera 150 mcg q2wks - last 5/12 Calcitriol  0.5mcg PO qHD - last 5/23 Home meds: Midodrine  15mg  TID Renvela  0.8GM powder 2 packets TID   Assessment/Plan: Syncopal episode - appears patient also had seizure-like acitivity at admit. Neurology on board. Keppra load given. MRI brain showed no acute process and ECHO showed normal LVEF. Likely 2nd hypotension vs sz, see below Chronic hypotension - still hypotensive despite Midodrine  15mg  TID. Raise Midodrine  to 20mg  TID. If no improvement, okay to start Fludrocortisone.  ESRD -  on HD MWF. Plan for HD 5/26 per his routine schedule. Last CXR no acute process. Plan to run even for tomorrow. Volume  - Does not appear overloaded. See above-  does not need much off with HD at all -  no UF tomorrow  Anemia of CKD - Will obtain iron studies. Next ESA due tomorrow-will order Secondary Hyperparathyroidism -  Corr Ca okay. Checking phos in AM. Will resume Calcitriol  Presacral fluid collection - S/p drain placement 5/7 in IR. On IV ABXs. Nutrition - Renal diet with fluid restriction GOC - Noted patient has had multiple hospitalizations over this year alone. Considering age and chronic co-morbidities, I feel a Palliative Care consult is warranted. Unfortunately, the patient is not making it clear today on overall GOC. Recommend speaking with his daughter first to ensure she is amendable with the consult.  Dispo - Inpatient  Jadene Maxwell, NP [facility] 11/29/2023, 12:52 PM    Patient seen and examined, agree with above note with above modifications. Chronically ill  with syncope vs sz-  apparently BP is "always low"  max out midodrine  and ok to add florinef-  minimal UF with HD.  I agree with considering palliative care given his multiple hospitalizations and poor insight. Routine HD tomorrow  Nicolas Barren, MD 11/29/2023

## 2023-11-29 NOTE — Plan of Care (Signed)
 EEG Impression and clinical correlation: This EEG was obtained while awake and asleep and is abnormal due to intermittent mild diffuse slowing indicative of global cerebral dysfunction. Epileptiform abnormalities were not seen during this recording.   MRI brain personally reviewed, agree with radiology no acute intracranial process mildly motion limited  No further inpatient neurological workup at this time.  As documented before, no indication for antiseizure medications.  Neurology will sign off at this time, please reach out if additional questions or concerns arise  No charge note  Baldwin Levee MD-PhD [facility] [phone] Available 7 AM to 7 PM, outside these hours please contact Neurologist on call listed on AMION

## 2023-11-30 DIAGNOSIS — R55 Syncope and collapse: Secondary | ICD-10-CM | POA: Diagnosis not present

## 2023-11-30 DIAGNOSIS — I959 Hypotension, unspecified: Secondary | ICD-10-CM

## 2023-11-30 LAB — CBC WITH DIFFERENTIAL/PLATELET
Abs Immature Granulocytes: 0.04 10*3/uL (ref 0.00–0.07)
Basophils Absolute: 0 10*3/uL (ref 0.0–0.1)
Basophils Relative: 0 %
Eosinophils Absolute: 0.2 10*3/uL (ref 0.0–0.5)
Eosinophils Relative: 3 %
HCT: 23.7 % — ABNORMAL LOW (ref 39.0–52.0)
Hemoglobin: 7.7 g/dL — ABNORMAL LOW (ref 13.0–17.0)
Immature Granulocytes: 1 %
Lymphocytes Relative: 8 %
Lymphs Abs: 0.6 10*3/uL — ABNORMAL LOW (ref 0.7–4.0)
MCH: 39.1 pg — ABNORMAL HIGH (ref 26.0–34.0)
MCHC: 32.5 g/dL (ref 30.0–36.0)
MCV: 120.3 fL — ABNORMAL HIGH (ref 80.0–100.0)
Monocytes Absolute: 0.7 10*3/uL (ref 0.1–1.0)
Monocytes Relative: 9 %
Neutro Abs: 6.5 10*3/uL (ref 1.7–7.7)
Neutrophils Relative %: 79 %
Platelets: 177 10*3/uL (ref 150–400)
RBC: 1.97 MIL/uL — ABNORMAL LOW (ref 4.22–5.81)
RDW: 17.2 % — ABNORMAL HIGH (ref 11.5–15.5)
WBC: 8.2 10*3/uL (ref 4.0–10.5)
nRBC: 0 % (ref 0.0–0.2)

## 2023-11-30 LAB — IRON AND TIBC
Iron: 71 ug/dL (ref 45–182)
Saturation Ratios: 27 % (ref 17.9–39.5)
TIBC: 259 ug/dL (ref 250–450)
UIBC: 188 ug/dL

## 2023-11-30 LAB — LACTIC ACID, PLASMA
Lactic Acid, Venous: 2 mmol/L (ref 0.5–1.9)
Lactic Acid, Venous: 1.4 mmol/L (ref 0.5–1.9)

## 2023-11-30 LAB — RENAL FUNCTION PANEL
Albumin: 3 g/dL — ABNORMAL LOW (ref 3.5–5.0)
Anion gap: 16 — ABNORMAL HIGH (ref 5–15)
BUN: 39 mg/dL — ABNORMAL HIGH (ref 8–23)
CO2: 24 mmol/L (ref 22–32)
Calcium: 8.6 mg/dL — ABNORMAL LOW (ref 8.9–10.3)
Chloride: 99 mmol/L (ref 98–111)
Creatinine, Ser: 8.06 mg/dL — ABNORMAL HIGH (ref 0.61–1.24)
GFR, Estimated: 6 mL/min — ABNORMAL LOW (ref >60)
Glucose, Bld: 128 mg/dL — ABNORMAL HIGH (ref 70–99)
Phosphorus: 5.6 mg/dL — ABNORMAL HIGH (ref 2.5–4.6)
Potassium: 3.7 mmol/L (ref 3.5–5.1)
Sodium: 139 mmol/L (ref 135–145)

## 2023-11-30 LAB — FERRITIN: Ferritin: 1357 ng/mL — ABNORMAL HIGH (ref 24–336)

## 2023-11-30 MED ORDER — ALBUMIN HUMAN 25 % IV SOLN
25.0000 g | INTRAVENOUS | Status: AC
  Administered 2023-11-30: 25 g via INTRAVENOUS
  Filled 2023-11-30: qty 100

## 2023-11-30 MED ORDER — MIDODRINE HCL 5 MG PO TABS
20.0000 mg | ORAL_TABLET | Freq: Three times a day (TID) | ORAL | Status: DC
  Administered 2023-11-30 – 2023-12-03 (×11): 20 mg via ORAL
  Filled 2023-11-30 (×11): qty 4

## 2023-11-30 MED ORDER — SODIUM CHLORIDE 0.9 % IV BOLUS
250.0000 mL | Freq: Once | INTRAVENOUS | Status: AC
  Administered 2023-11-30: 250 mL via INTRAVENOUS

## 2023-11-30 MED ORDER — VANCOMYCIN HCL 750 MG/150ML IV SOLN
INTRAVENOUS | Status: AC
  Filled 2023-11-30: qty 150

## 2023-11-30 MED ORDER — SODIUM CHLORIDE 0.9 % IV BOLUS
100.0000 mL | Freq: Once | INTRAVENOUS | Status: AC
  Administered 2023-11-30: 100 mL via INTRAVENOUS

## 2023-11-30 MED ORDER — FLUDROCORTISONE ACETATE 0.1 MG PO TABS
0.1000 mg | ORAL_TABLET | Freq: Every day | ORAL | Status: DC
  Administered 2023-11-30 – 2023-12-03 (×4): 0.1 mg via ORAL
  Filled 2023-11-30 (×4): qty 1

## 2023-11-30 NOTE — Progress Notes (Signed)
 OT Cancellation Note  Patient Details Name: Joshua Riley MRN: [id number] DOB: Mar 20, 1936   Cancelled Treatment:    Reason Eval/Treat Not Completed: Patient at procedure or test/ unavailable (HD)  [name] 11/30/2023, 8:02 AM [name], OTR/L Acute Rehabilitation Services Office: [phone]

## 2023-11-30 NOTE — Progress Notes (Signed)
   11/30/23 0445  Assess: MEWS Score  Temp 98.1 F (36.7 C)  BP (!) 78/36  ECG Heart Rate 71  Resp (!) 21  SpO2 98 %  Assess: MEWS Score  MEWS Temp 0  MEWS Systolic 2  MEWS Pulse 0  MEWS RR 1  MEWS LOC 0  MEWS Score 3  MEWS Score Color Yellow  Assess: if the MEWS score is Yellow or Red  Were vital signs accurate and taken at a resting state? Yes  Does the patient meet 2 or more of the SIRS criteria? No  MEWS guidelines implemented  Yes, yellow  Treat  MEWS Interventions Considered administering scheduled or prn medications/treatments as ordered  Take Vital Signs  Increase Vital Sign Frequency  Yellow: Q2hr x1, continue Q4hrs until patient remains green for 12hrs  Escalate  MEWS: Escalate Yellow: Discuss with charge nurse and consider notifying provider and/or RRT  Notify: Charge Nurse/RN  Name of Charge Nurse/RN Notified Heather RN  Provider Notification  Provider Name/Title Sundil MD  Date Provider Notified 11/29/23  Time Provider Notified [phone]  Method of Notification  (Secured chat)  Provider response See new orders  Date of Provider Response 11/29/23  Time of Provider Response [id number]  Assess: SIRS CRITERIA  SIRS Temperature  0  SIRS Respirations  1  SIRS Pulse 0  SIRS WBC 0  SIRS Score Sum  1

## 2023-11-30 NOTE — Progress Notes (Addendum)
 PROGRESS NOTE    Joshua Riley  JXB:[id number] DOB: 1935/08/23 DOA: 11/27/2023 PCP: Almira Jaeger, MD   Brief Narrative: Joshua Riley is a 88 y.o. male with a history of ESRD on HD, atrial fibrillation on Eliquis , CVA, hyperlipidemia, depression, obstructive uropathy s/p left-sided percutaneous nephrostomy, ulcerative colitis s/p colectomy with ileostomy, ileal pouch and anal anastomosis, chronic hypotension on midodrine , anemia of chronic disease, sacral osteomyelitis on antibiotics.  Patient presented secondary to syncope with concern for hypotension mediated syncope. Hospitalization complicated by seizure-like activity.   Assessment/Plan:  Syncope Appears to be secondary to hypotension, however patient had seizure-like activity during admission. Patient placed on telemetry. Recent Transthoracic Echocardiogram significant for normal LVEF and no evidence of aortic stenosis.  Symptomatic hypotension Chronic hypotension. Patient is on midodrine  as an outpatient. Still with hypotension this admission. Prior workup for adrenal insufficiency appear to be not consistent with diagnosis. Transthoracic Echocardiogram (11/10/2023) from last admission significant for normal LVEF with mild aortic valve regurgitation and no regional wall motion abnormalities. -Continue midodrine ; increased by nephrology to 20 mg TID -If no improvement on midodrine , will trial Florinef  Addendum: Post-dialysis, patient with persistently low blood pressure after a total of 300 mL NS bolus. Discussed with nephrology for fluid resuscitation limits. Added Florinef. Persistent hypotension. Went to patient's room to discuss need to ICU transfer and addition of pressors for blood pressure support. Discussed goals of care as well. Patient states he DOES NOT want to be resuscitated, referencing his age at 56. He also states that even while alive, he DOES NOT want intubation and mechanical ventilation. We also discussed  threat to life regarding hypotension and potential inability to tolerate hemodialysis, and he voices understanding. Called daughter, Joshua Riley, who was updated as well and all questions answered.  Seizure-like activity Present after admission and witnessed by nursing staff, lasting 2 minutes with spontaneous cessation. Neurology consulted and recommended MRI and EEG. MRI and EEG without acute pathology or evidence of seizure activity. Per neurology, seizure-like activity is likely secondary to convulsive syncope. AEDs discontinued.  Lactic acidosis Lactic acid up to 5.1 on admission. In setting of hypotension, but also possibly related to possible seizure. Resolved.  ESRD on hemodialysis Nephrology consulted for management. Hemodialysis today.  Confusion Appears to have been related to initial presentation and seizure-like activity, however, per neurology, unlikely true seizure. Patient also has some dysarthria, however no focal lesion noted on MRI imaging. Possibly related to developing uremia symptoms. Ammonia normal. -SLP evaluation  Presacral fluid collection Patient is s/p drain placement on 5/7, prior to this admission. During this admission, drain fell out during hospitalization. IR consulted with recommendation for no replacement secondary to no current fluid collection noted on imaging.  Abnormal bladder CT Moderate amount of heterogenous material seen within the urinary bladder, possibly related to diluted contrast versus blood products. Additionally, CT notes bladder wall thickening, unable to exclude neoplastic process. Patient will need urology follow-up.  Paroxysmal atrial fibrillation Patient is on Eliquis  as an outpatient. Eliquis  held secondary to concern for possible blood products seen in bladder on CT pelvis. -Continue Eliquis   Ulcerative colitis Patient is s/p colectomy with ileostomy and ileal pouch and anal anastomosis. -Continue ostomy care  History of elevated  TSH Noted on prior admission. Free T4 value suggested subclinical hypothyroidism. -Recheck TSH and free T4  History of CVA Hyperlipidemia Patient is on Lipitor as an outpatient. He is also on Eliquis .  Depression Patient was previously on Zoloft  which appears to have been discontinued prior  to admission.  Sacral osteomyelitis Patient is on Vancomycin  and Ertapenem  as an outpatient, with last day of therapy scheduled for 5/28.   DVT prophylaxis: Eliquis  Code Status:   Code Status: Full Code Family Communication: None at bedside. Daughter on telephone Disposition Plan: Discharge pending syncope and seizure-like activity workup   Consultants:  Neurology (declined)  Procedures:  None  Antimicrobials: Vancomycin  Meropenem     Subjective: No concerns. Hoping to discharge. When asked about his speech, he has noticed but notes no other symptoms. Patient seen just before receiving hemodialysis.  Objective: BP (!) 81/55 (BP Location: Right Arm)   Pulse 78   Temp (!) 97.3 F (36.3 C) (Oral)   Resp 16   Ht 5\' 8"  (1.727 m)   Wt 69.1 kg   SpO2 100%   BMI 23.16 kg/m   Examination:  General exam: Appears calm and comfortable Respiratory system: Clear to auscultation. Respiratory effort normal. Cardiovascular system: S1 & S2 heard, RRR. Joshua Riley Gastrointestinal system: Abdomen is nondistended, soft and nontender. Normal bowel sounds heard. Central nervous system: Alert and oriented.    Data Reviewed: I have personally reviewed following labs and imaging studies   Last CBC Lab Results  Component Value Date   WBC 8.2 11/30/2023   HGB 7.7 (L) 11/30/2023   HCT 23.7 (L) 11/30/2023   MCV 120.3 (H) 11/30/2023   MCH 39.1 (H) 11/30/2023   RDW 17.2 (H) 11/30/2023   PLT 177 11/30/2023     Last metabolic panel Lab Results  Component Value Date   GLUCOSE 128 (H) 11/30/2023   NA 139 11/30/2023   K 3.7 11/30/2023   CL 99 11/30/2023   CO2 24 11/30/2023   BUN 39 (H) 11/30/2023    CREATININE 8.06 (H) 11/30/2023   GFRNONAA 6 (L) 11/30/2023   CALCIUM  8.6 (L) 11/30/2023   PHOS 5.6 (H) 11/30/2023   PROT 5.8 (L) 11/29/2023   ALBUMIN  3.0 (L) 11/30/2023   BILITOT 1.1 11/29/2023   ALKPHOS 56 11/29/2023   AST 23 11/29/2023   ALT 16 11/29/2023   ANIONGAP 16 (H) 11/30/2023     Creatinine Clearance: Estimated Creatinine Clearance: 6.5 mL/min (A) (by C-G formula based on SCr of 8.06 mg/dL (H)).  Recent Results (from the past 240 hours)  Resp panel by RT-PCR (RSV, Flu A&B, Covid) Anterior Nasal Swab     Status: None   Collection Time: 11/27/23  2:04 PM   Specimen: Anterior Nasal Swab  Result Value Ref Range Status   SARS Coronavirus 2 by RT PCR NEGATIVE NEGATIVE Final   Influenza A by PCR NEGATIVE NEGATIVE Final   Influenza B by PCR NEGATIVE NEGATIVE Final    Comment: (NOTE) The Xpert Xpress SARS-CoV-2/FLU/RSV plus assay is intended as an aid in the diagnosis of influenza from Nasopharyngeal swab specimens and should not be used as a sole basis for treatment. Nasal washings and aspirates are unacceptable for Xpert Xpress SARS-CoV-2/FLU/RSV testing.  Fact Sheet for Patients: BloggerCourse.com  Fact Sheet for Healthcare Providers: SeriousBroker.it  This test is not yet approved or cleared by the [place]  FDA and has been authorized for detection and/or diagnosis of SARS-CoV-2 by FDA under an Emergency Use Authorization (EUA). This EUA will remain in effect (meaning this test can be used) for the duration of the COVID-19 declaration under Section 564(b)(1) of the Act, 21 U.S.C. section 360bbb-3(b)(1), unless the authorization is terminated or revoked.     Resp Syncytial Virus by PCR NEGATIVE NEGATIVE Final    Comment: (NOTE) Fact  Sheet for Patients: BloggerCourse.com  Fact Sheet for Healthcare Providers: SeriousBroker.it  This test is not yet approved  or cleared by the [place]  FDA and has been authorized for detection and/or diagnosis of SARS-CoV-2 by FDA under an Emergency Use Authorization (EUA). This EUA will remain in effect (meaning this test can be used) for the duration of the COVID-19 declaration under Section 564(b)(1) of the Act, 21 U.S.C. section 360bbb-3(b)(1), unless the authorization is terminated or revoked.  Performed at [facility], [address] [place], [place] [address]   Blood Culture (routine x 2)     Status: None (Preliminary result)   Collection Time: 11/27/23  2:12 PM   Specimen: BLOOD RIGHT FOREARM  Result Value Ref Range Status   Specimen Description BLOOD RIGHT FOREARM  Final   Special Requests   Final    BOTTLES DRAWN AEROBIC AND ANAEROBIC Blood Culture results may not be optimal due to an inadequate volume of blood received in culture bottles   Culture   Final    NO GROWTH 3 DAYS Performed at [facility], [address] [place], [place] [address]    Report Status PENDING  Incomplete  Blood Culture (routine x 2)     Status: None (Preliminary result)   Collection Time: 11/27/23  2:30 PM   Specimen: BLOOD  Result Value Ref Range Status   Specimen Description BLOOD RIGHT ANTECUBITAL  Final   Special Requests   Final    BOTTLES DRAWN AEROBIC AND ANAEROBIC Blood Culture adequate volume   Culture   Final    NO GROWTH 3 DAYS Performed at [facility], [address] [place], [place] [address]    Report Status PENDING  Incomplete      Radiology Studies: MR BRAIN WO CONTRAST Result Date: 11/29/2023 CLINICAL DATA:  Seizure. EXAM: MRI HEAD WITHOUT CONTRAST TECHNIQUE: Multiplanar, multiecho pulse sequences of the brain and surrounding structures were obtained without intravenous contrast. COMPARISON:  Head CT from yesterday FINDINGS: Brain: No acute infarction, hemorrhage, hydrocephalus, extra-axial collection or mass lesion. Generalized brain atrophy, subjective mild for age.  Chronic small vessel ischemia to a mild degree in the periventricular white matter. Mild generalized atrophy. No chronic blood products or focal cortical abnormality to correlate with seizure history. Incomplete study, no coronal T2 weighted sequences. Vascular: Major flow voids are preserved. Skull and upper cervical spine: No focal marrow lesion. Upper cervical facet ankylosis. Sinuses/Orbits: Mild generalized mucosal thickening in paranasal sinuses and maxillary sinuses. Negative orbits. IMPRESSION: Aging brain without acute or reversible finding. No focal correlate for seizure history. Pervasive motion artifact with mild exam truncation Electronically Signed   By: Ronnette Coke M.D.   On: 11/29/2023 10:37      LOS: 2 days    Aneita Keens, MD [facility] 11/30/2023, 1:48 PM   If 7PM-7AM, please contact night-coverage [email or web address]

## 2023-11-30 NOTE — TOC Progression Note (Signed)
 Transition of Care [facility]) - Progression Note    Patient Details  Name: Joshua Riley MRN: [id number] Date of Birth: April 20, 1936  [facility]) CM/SW Contact  Ernst Heap Phone Number: [phone] 11/30/2023, 2:50 PM  Clinical Narrative:   2:49 PM- CSW called to speak with the patients daughter. Patient daughter stated that she was speaking with the NCM and would call back.   CSW received a message via secure chat the they daughter wanted some information about ALFs.   TOC will continue following.          Expected Discharge Plan and Services                                               Social Determinants of Health (SDOH) Interventions SDOH Screenings   Food Insecurity: No Food Insecurity (11/28/2023)  Housing: Low Risk  (11/28/2023)  Transportation Needs: No Transportation Needs (11/28/2023)  Utilities: Not At Risk (11/28/2023)  Alcohol Screen: Low Risk  (10/26/2023)  Depression (PHQ2-9): Low Risk  (11/17/2023)  Financial Resource Strain: Low Risk  (10/26/2023)  Physical Activity: Insufficiently Active (10/26/2023)  Social Connections: Socially Isolated (11/28/2023)  Stress: Stress Concern Present (10/26/2023)  Tobacco Use: Medium Risk (11/28/2023)    Readmission Risk Interventions    11/10/2023    3:46 PM 07/21/2023   12:26 PM  Readmission Risk Prevention Plan  Transportation Screening Complete Complete  PCP or Specialist Appt within 5-7 Days Complete   Home Care Screening Complete   Medication Review (RN CM) Complete   HRI or Home Care Consult  Complete  Social Work Consult for Recovery Care Planning/Counseling  Complete  Palliative Care Screening  Not Applicable  Medication Review Oceanographer)  Complete

## 2023-11-30 NOTE — Progress Notes (Signed)
   11/30/23 1140  Vitals  Temp (!) 97.3 F (36.3 C)  Pulse Rate 67  Resp 12  BP (!) 85/49  SpO2 100 %  O2 Device Nasal Cannula  Weight 69.1 kg  Oxygen Therapy  O2 Flow Rate (L/min) 2 L/min  Patient Activity (if Appropriate) In bed  Pulse Oximetry Type Continuous  Post Treatment  Dialyzer Clearance Clear  Liters Processed 72  Fluid Removed (mL) -200 mL  Tolerated HD Treatment Yes   Received patient in bed to unit.  Alert and oriented.  Informed consent signed and in chart.   TX duration: Three hours  Patient tolerated well.  Transported back to the room  Alert, without acute distress.  Hand-off given to patient's nurse.   Access used: Left femoral graft Access issues: None  Medication(s) given: 750mg  Vancomycin

## 2023-11-30 NOTE — Progress Notes (Signed)
 Patient's BP consistently low since return from HD. SBP currently 70/44 when checked manually. Patient is sleepy, but awakens easily. Patient able to answer all orientation questions. Dr. Duard Getting has been updated since patient's return to floor; see new orders. Dr. Duard Getting also came to the bedside to assess the patient.

## 2023-11-30 NOTE — Progress Notes (Signed)
 Joshua Riley Progress Note   Subjective:   Seen on HD, BP soft but asx, UF off and giving small fluid bolus of . HR intermittently reading low on the monitor but RRR on auscultation. He denies SOB, CP, dizziness, nausea. Still seems a bit confused, needs help using his phone today.   Objective Vitals:   11/30/23 0830 11/30/23 0930 11/30/23 1000 11/30/23 1030  BP: (!) 94/45 (!) 93/50 (!) 84/61 (!) 87/73  Pulse: 72 (!) 37 65 (!) 44  Resp: 14 17 14 19   Temp:      TempSrc:      SpO2: 98% 93% 95% 96%  Weight:      Height:       Physical Exam General: Alert male in NAD Heart: RRR, no murmurs, rubs or gallops Lungs: Soft, non-distended, +BS Abdomen: Soft, non-distended, +BS Extremities: No edema b/l lowe rextremities Dialysis Access: thigh AVG accessed.   Additional Objective Labs: Basic Metabolic Panel: Recent Labs  Lab 11/28/23 0420 11/29/23 0230 11/30/23 0500  NA 140 139 139  K 4.2 3.8 3.7  CL 100 101 99  CO2 24 26 24   GLUCOSE 120* 92 128*  BUN 16 21 39*  CREATININE 5.12* 6.14* 8.06*  CALCIUM  8.7* 8.7* 8.6*  PHOS  --   --  5.6*   Liver Function Tests: Recent Labs  Lab 11/27/23 1412 11/29/23 0230 11/30/23 0500  AST 25 23  --   ALT 15 16  --   ALKPHOS 69 56  --   BILITOT 0.8 1.1  --   PROT 6.6 5.8*  --   ALBUMIN  3.3* 3.1* 3.0*   No results for input(s): "LIPASE", "AMYLASE" in the last 168 hours. CBC: Recent Labs  Lab 11/27/23 1412 11/28/23 0420 11/29/23 0230 11/30/23 0500  WBC 7.9 7.1 8.1 8.2  NEUTROABS 6.9  --   --  6.5  HGB 8.0* 8.0* 7.5* 7.7*  HCT 25.2* 26.1* 23.4* 23.7*  MCV 121.2* 126.1* 121.2* 120.3*  PLT 171 150 171 177   Blood Culture    Component Value Date/Time   SDES BLOOD RIGHT ANTECUBITAL 11/27/2023 1430   SPECREQUEST  11/27/2023 1430    BOTTLES DRAWN AEROBIC AND ANAEROBIC Blood Culture adequate volume   CULT  11/27/2023 1430    NO GROWTH 3 DAYS Performed at [facility], [address], [place],  [place] [address]    REPTSTATUS PENDING 11/27/2023 1430    Cardiac Enzymes: No results for input(s): "CKTOTAL", "CKMB", "CKMBINDEX", "TROPONINI" in the last 168 hours. CBG: Recent Labs  Lab 11/28/23 0223 11/28/23 0635 11/29/23 0120  GLUCAP 104* 92 85   Iron Studies:  Recent Labs    11/30/23 0500  IRON 71  TIBC 259  FERRITIN 1,357*   @[email or web address] @ Studies/Results: MR BRAIN WO CONTRAST Result Date: 11/29/2023 CLINICAL DATA:  Seizure. EXAM: MRI HEAD WITHOUT CONTRAST TECHNIQUE: Multiplanar, multiecho pulse sequences of the brain and surrounding structures were obtained without intravenous contrast. COMPARISON:  Head CT from yesterday FINDINGS: Brain: No acute infarction, hemorrhage, hydrocephalus, extra-axial collection or mass lesion. Generalized brain atrophy, subjective mild for age. Chronic small vessel ischemia to a mild degree in the periventricular white matter. Mild generalized atrophy. No chronic blood products or focal cortical abnormality to correlate with seizure history. Incomplete study, no coronal T2 weighted sequences. Vascular: Major flow voids are preserved. Skull and upper cervical spine: No focal marrow lesion. Upper cervical facet ankylosis. Sinuses/Orbits: Mild generalized mucosal thickening in paranasal sinuses and maxillary sinuses. Negative orbits. IMPRESSION:  Aging brain without acute or reversible finding. No focal correlate for seizure history. Pervasive motion artifact with mild exam truncation Electronically Signed   By: Ronnette Coke M.D.   On: 11/29/2023 10:37   EEG adult Result Date: 11/28/2023 Eleni Griffin, MD     11/29/2023 12:30 PM Routine EEG Report Joshua Riley is a 88 y.o. male with a history of syncope who is undergoing an EEG to evaluate for seizures. Report: This EEG was acquired with electrodes placed according to the International 10-20 electrode system (including Fp1, Fp2, F3, F4, C3, C4, P3, P4, O1, O2, T3, T4, T5, T6, A1, A2, Fz, Cz, Pz). The  following electrodes were missing or displaced: none. The best background was 8.5 Hz which displayed intermittent mild diffuse slowing. This activity is reactive to stimulation. Drowsiness was manifested by background fragmentation; deeper stages of sleep were identified by K complexes and sleep spindles. There was no focal slowing. There were no interictal epileptiform discharges. There were no electrographic seizures identified. Photic stimulation and hyperventilation were not performed. Impression and clinical correlation: This EEG was obtained while awake and asleep and is abnormal due to intermittent mild diffuse slowing indicative of global cerebral dysfunction. Epileptiform abnormalities were not seen during this recording. Greg Leaks, MD [facility] [phone] If 7pm- 7am, please page neurology on call as listed in AMION.   Medications:  meropenem  (MERREM ) IV 1 g (11/29/23 1056)   vancomycin  750 mg (11/30/23 1026)    apixaban   2.5 mg Oral BID   atorvastatin   20 mg Oral Daily   calcitRIOL   0.5 mcg Oral Q M,W,F-HD   darbepoetin (ARANESP ) injection - DIALYSIS  100 mcg Subcutaneous Q Mon-1800   midodrine   20 mg Oral TID WC   timolol   1 drop Both Eyes BID    Dialysis Orders: MWF - [facility] 2hrs85min, BFR 350, DFR Auto 1.5,  EDW 70kg, 2K/ 2.5Ca Heparin  None ordered Mircera 150 mcg q2wks - last 5/12 Calcitriol  0.5mcg PO qHD - last 5/23 Home meds: Midodrine  15mg  TID Renvela  0.8GM powder 2 packets TID  Assessment/Plan: Syncopal episode - appears patient also had seizure-like acitivity at admit. Neurology on board. Keppra load given. MRI brain showed no acute process and ECHO showed normal LVEF. Likely 2nd hypotension vs sz, see below Chronic hypotension - still hypotensive despite Midodrine  15mg  TID. Raise Midodrine  to 20mg  TID. If no improvement, okay to start Fludrocortisone.  ESRD -  on HD MWF.  Last CXR no acute process. No UF today due to hypotension.   Volume  - Does not appear overloaded. See above Anemia of CKD - Hgb 7.7, tsat borderline but ferritin is high. Continue weekly aranesp  Secondary Hyperparathyroidism -  Corr Ca controlled. Phos at goal. Will resume Calcitriol  Presacral fluid collection - S/p drain placement 5/7 in IR. On IV ABXs. Nutrition - Renal diet with fluid restriction GOC - Noted patient has had multiple hospitalizations over this year alone. Considering age and chronic co-morbidities, I feel a Palliative Care consult is warranted.  Dispo - Inpatient    [place], PA-C 11/30/2023, 10:42 AM  [facility] Pager: [phone]

## 2023-11-30 NOTE — Progress Notes (Signed)
 [facility] PCCM PROGRESS NOTE   Called to bedside to evaluate Mr. Joshua Riley for worsening hypotension.  Briefly this is an 88 year old male with history of end-stage renal disease on hemodialysis who has of late been chronically hypotensive.  This has been complicating his ability to receive dialysis.  He has had some syncope as well which has been attributed to hypotension.  He is being treated with midodrine  and Florinef.  Today systolics in the 70s despite treatment after dialysis which only 200 mL of fluid were removed.  Evaluated with Dr. Felipe Horton.    Upon PCCM arrival patient indeed has systolic blood pressure in the 70s.  Clinically the patient is well-perfused and is alert and oriented speaking complete sentences.  He does complain of lightheadedness but this is not a new finding and has been chronic for some time.  Lactic acid 2.  Will repeat lactic acid around 8:00 tonight.  If remains stable or improving he will be okay to remain on the floor and target a MAP goal greater than 50.  She had lactic acid began to rise please contact PCCM for possible transfer to ICU.   Roz Cornelia, AGACNP-BC [facility]  See Amion for personal pager PCCM on call pager [phone] until 7pm. Please call Elink 7p-7a. [phone]  11/30/2023 5:50 PM

## 2023-11-30 NOTE — TOC Initial Note (Signed)
 Transition of Care [facility]) - Initial/Assessment Note    Patient Details  Name: Joshua Riley MRN: [id number] Date of Birth: May 17, 1936  Transition of Care [facility]) CM/SW Contact:    Cosimo Diones, RN Phone Number: 11/30/2023, 3:08 PM  Clinical Narrative: Patient presented for syncopal episode; Hx ESRD MWF. PTA patient was from home alone. Daughter was in the room during the visit. Daughter states that the patient has intermittent caregivers in the home and when they are their they provide 4 hours at a time not 24/7 coverage. Daughter has concerns with patient returning home alone due to his cognition. Patient will need to be seen by PT/OT for further recommendations. Daughter feels that the patient will benefit from SNF and then she will have time to explore ALF that may accept the patient.  CSW to provide daughter with ALF list. Case Manager will continue to follow for additional transition of care needs as the patient progresses.    Expected Discharge Plan: Skilled Nursing Facility Barriers to Discharge: Continued Medical Work up  Expected Discharge Plan and Services In-house Referral: NA Discharge Planning Services: CM Consult Post Acute Care Choice: Skilled Nursing Facility Living arrangements for the past 2 months: Single Family Home                   DME Agency: NA  Prior Living Arrangements/Services Living arrangements for the past 2 months: Single Family Home Lives with:: Self (Daughter checks in)          Need for Family Participation in Patient Care: Yes (Comment) Care giver support system in place?: No (comment)  Activities of Daily Living   ADL Screening (condition at time of admission) Independently performs ADLs?: Yes (appropriate for developmental age) Is the patient deaf or have difficulty hearing?: Yes Does the patient have difficulty seeing, even when wearing glasses/contacts?: No Does the patient have difficulty concentrating, remembering, or  making decisions?: No  Permission Sought/Granted                  Emotional Assessment Appearance:: Appears stated age Attitude/Demeanor/Rapport: Engaged Affect (typically observed): Appropriate Orientation: : Oriented to Self Alcohol / Substance Use: Not Applicable Psych Involvement: No (comment)  Admission diagnosis:  Syncope [R55] Hypotension, unspecified hypotension type [I95.9] Syncope, unspecified syncope type [R55] Patient Active Problem List   Diagnosis Date Noted   Syncope 11/28/2023   Hypotension 11/28/2023   Lactic acidosis 11/28/2023   Acute pericarditis 11/11/2023   Sacral osteomyelitis (HCC) 11/11/2023   Chest pain 11/09/2023   Abscess of abdominal cavity (HCC) 07/17/2023   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   Anastomotic leak of intestine 07/14/2023   History of COPD 07/08/2023   History of anemia due to chronic kidney disease 07/08/2023   Abscess of groin, left 07/07/2023   Macrocytic anemia 04/08/2023   Depression 04/08/2023   Perirectal abscess 03/23/2023   Infection due to ESBL-producing Escherichia coli 03/23/2023   DOE (dyspnea on exertion) 07/22/2022   Coag negative Staphylococcus bacteremia 06/06/2022   History of sepsis 06/02/2022   Ulcerative colitis (HCC) 06/02/2022   End-stage renal disease on hemodialysis (HCC) 05/06/2022   SBO (small bowel obstruction) (HCC) 11/18/2021   Glaucoma 11/18/2021   Atrial fibrillation (HCC) 11/18/2021   Orthostatic hypotension 10/22/2021   Idiopathic neuropathy 10/22/2021   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   Nephrostomy present (HCC) 10/22/2021   PCP:  Almira Jaeger, MD Pharmacy:   [phone] [name], [place] - [address] [place]  [address] Phone: [phone] Fax: [phone]  [facility] [address] [place] [address] Phone: [phone] Fax: [phone]     Social Drivers of Health (SDOH) Social History: SDOH  Screenings   Food Insecurity: No Food Insecurity (11/28/2023)  Housing: Low Risk  (11/28/2023)  Transportation Needs: No Transportation Needs (11/28/2023)  Utilities: Not At Risk (11/28/2023)  Alcohol Screen: Low Risk  (10/26/2023)  Depression (PHQ2-9): Low Risk  (11/17/2023)  Financial Resource Strain: Low Risk  (10/26/2023)  Physical Activity: Insufficiently Active (10/26/2023)  Social Connections: Socially Isolated (11/28/2023)  Stress: Stress Concern Present (10/26/2023)  Tobacco Use: Medium Risk (11/28/2023)   Readmission Risk Interventions    11/10/2023    3:46 PM 07/21/2023   12:26 PM  Readmission Risk Prevention Plan  Transportation Screening Complete Complete  PCP or Specialist Appt within 5-7 Days Complete   Home Care Screening Complete   Medication Review (RN CM) Complete   HRI or Home Care Consult  Complete  Social Work Consult for Recovery Care Planning/Counseling  Complete  Palliative Care Screening  Not Applicable  Medication Review Oceanographer)  Complete

## 2023-11-30 NOTE — Consult Note (Deleted)
 [facility] PCCM PROGRESS NOTE   Called to bedside to evaluate Mr. Joshua Riley for worsening hypotension.  Briefly this is an 88 year old male with history of end-stage renal disease on hemodialysis who has of late been chronically hypotensive.  This has been complicating his ability to receive dialysis.  He has had some syncope as well which has been attributed to hypotension.  He is being treated with midodrine  and Florinef.  Today systolics in the 70s despite treatment after dialysis which only 200 mL of fluid were removed.  Evaluated with Dr. Felipe Horton.    Upon PCCM arrival patient indeed has systolic blood pressure in the 70s.  Clinically the patient is well-perfused and is alert and oriented speaking complete sentences.  He does complain of lightheadedness but this is not a new finding and has been chronic for some time.  Lactic acid 2.  Will repeat lactic acid around 8:00 tonight.  If remains stable or improving he will be okay to remain on the floor and target a MAP goal greater than 50.  She had lactic acid began to rise please contact PCCM for possible transfer to ICU.

## 2023-11-30 NOTE — Progress Notes (Signed)
 Subjective:   Patient ID: Joshua Riley, male   DOB: 88 y.o.   MRN: [id number]   HPI Patient presents painful lesion left stating that it is hard to walk on and patient is on blood thinner   ROS      Objective:  Physical Exam  Neurovascular status intact patient's left plantar fifth metatarsal left is painful keratotic tissue formation present     Assessment:  Chronic lesion fifth metatarsal left with patient high risk on blood thinner     Plan:  H&P reviewed Sharp sterile debridement of lesion accomplished no iatrogenic bleeding reappoint for routine care as needed

## 2023-11-30 NOTE — Progress Notes (Signed)
 OT Cancellation Note  Patient Details Name: Joshua Riley MRN: [id number] DOB: [birth year]   Cancelled Treatment:    Reason Eval/Treat Not Completed: Medical issues which prohibited therapy (Pt hypotensive s/p HD, receiving fluid bolus.)  [name] 11/30/2023, 3:30 PM [name], OTR/L Acute Rehabilitation Services Office: [phone]

## 2023-12-01 DIAGNOSIS — N186 End stage renal disease: Secondary | ICD-10-CM | POA: Diagnosis not present

## 2023-12-01 DIAGNOSIS — Z66 Do not resuscitate: Secondary | ICD-10-CM | POA: Diagnosis not present

## 2023-12-01 DIAGNOSIS — I480 Paroxysmal atrial fibrillation: Secondary | ICD-10-CM | POA: Diagnosis not present

## 2023-12-01 DIAGNOSIS — Z515 Encounter for palliative care: Secondary | ICD-10-CM | POA: Diagnosis not present

## 2023-12-01 DIAGNOSIS — R55 Syncope and collapse: Secondary | ICD-10-CM | POA: Diagnosis not present

## 2023-12-01 DIAGNOSIS — R569 Unspecified convulsions: Secondary | ICD-10-CM | POA: Diagnosis not present

## 2023-12-01 DIAGNOSIS — I959 Hypotension, unspecified: Secondary | ICD-10-CM | POA: Diagnosis not present

## 2023-12-01 LAB — CBC
HCT: 22.5 % — ABNORMAL LOW (ref 39.0–52.0)
Hemoglobin: 7.4 g/dL — ABNORMAL LOW (ref 13.0–17.0)
MCH: 39.6 pg — ABNORMAL HIGH (ref 26.0–34.0)
MCHC: 32.9 g/dL (ref 30.0–36.0)
MCV: 120.3 fL — ABNORMAL HIGH (ref 80.0–100.0)
Platelets: 167 10*3/uL (ref 150–400)
RBC: 1.87 MIL/uL — ABNORMAL LOW (ref 4.22–5.81)
RDW: 17 % — ABNORMAL HIGH (ref 11.5–15.5)
WBC: 6.7 10*3/uL (ref 4.0–10.5)
nRBC: 0 % (ref 0.0–0.2)

## 2023-12-01 LAB — RENAL FUNCTION PANEL
Albumin: 3 g/dL — ABNORMAL LOW (ref 3.5–5.0)
Anion gap: 12 (ref 5–15)
BUN: 27 mg/dL — ABNORMAL HIGH (ref 8–23)
CO2: 28 mmol/L (ref 22–32)
Calcium: 8.8 mg/dL — ABNORMAL LOW (ref 8.9–10.3)
Chloride: 97 mmol/L — ABNORMAL LOW (ref 98–111)
Creatinine, Ser: 5.24 mg/dL — ABNORMAL HIGH (ref 0.61–1.24)
GFR, Estimated: 10 mL/min — ABNORMAL LOW (ref >60)
Glucose, Bld: 113 mg/dL — ABNORMAL HIGH (ref 70–99)
Phosphorus: 4 mg/dL (ref 2.5–4.6)
Potassium: 4.1 mmol/L (ref 3.5–5.1)
Sodium: 137 mmol/L (ref 135–145)

## 2023-12-01 LAB — HEPATITIS B SURFACE ANTIBODY, QUANTITATIVE: Hep B S AB Quant (Post): 11.7 m[IU]/mL

## 2023-12-01 LAB — TSH: TSH: 4.948 u[IU]/mL — ABNORMAL HIGH (ref 0.350–4.500)

## 2023-12-01 LAB — T4, FREE: Free T4: 0.88 ng/dL (ref 0.61–1.12)

## 2023-12-01 MED ORDER — DARBEPOETIN ALFA 150 MCG/0.3ML IJ SOSY: 150.0000 ug | PREFILLED_SYRINGE | INTRAMUSCULAR | Status: DC

## 2023-12-01 MED ORDER — CHLORHEXIDINE GLUCONATE CLOTH 2 % EX PADS: 6.0000 | MEDICATED_PAD | Freq: Every day | CUTANEOUS | Status: DC

## 2023-12-01 NOTE — Progress Notes (Signed)
 PROGRESS NOTE    Joshua Riley  UJW:[id number] DOB: [birth year] DOA: 11/27/2023 PCP: Almira Jaeger, MD   Brief Narrative: Joshua Riley is a 88 y.o. male with a history of ESRD on HD, atrial fibrillation on Eliquis , CVA, hyperlipidemia, depression, obstructive uropathy s/p left-sided percutaneous nephrostomy, ulcerative colitis s/p colectomy with ileostomy, ileal pouch and anal anastomosis, chronic hypotension on midodrine , anemia of chronic disease, sacral osteomyelitis on antibiotics.  Patient presented secondary to syncope with concern for hypotension mediated syncope. Hospitalization complicated by seizure-like activity.   Assessment/Plan:  Syncope Appears to be secondary to hypotension, however patient had seizure-like activity during admission. Patient placed on telemetry. Recent Transthoracic Echocardiogram significant for normal LVEF and no evidence of aortic stenosis.  Symptomatic hypotension Chronic hypotension. Patient is on midodrine  as an outpatient. Still with hypotension this admission. Prior workup for adrenal insufficiency appear to be not consistent with diagnosis. Transthoracic Echocardiogram (11/10/2023) from last admission significant for normal LVEF with mild aortic valve regurgitation and no regional wall motion abnormalities. Florinef added on 5/26 for persistent and worsened hypotension. -Continue midodrine ; increased by nephrology to 20 mg TID -Continue Florinef -Watch hemoglobin as a possible etiology  Seizure-like activity Present after admission and witnessed by nursing staff, lasting 2 minutes with spontaneous cessation. Neurology consulted and recommended MRI and EEG. MRI and EEG without acute pathology or evidence of seizure activity. Per neurology, seizure-like activity is likely secondary to convulsive syncope. AEDs discontinued.  Lactic acidosis Lactic acid up to 5.1 on admission. In setting of hypotension, but also possibly related to possible  seizure. Resolved.  ESRD on hemodialysis Nephrology consulted for management. Hemodialysis today.  Confusion Appears to have been related to initial presentation and seizure-like activity, however, per neurology, unlikely true seizure. Patient also has some dysarthria, however no focal lesion noted on MRI imaging. Possibly related to developing uremia symptoms. Ammonia normal. -SLP evaluation pending  Presacral fluid collection Patient is s/p drain placement on 5/7, prior to this admission. During this admission, drain fell out during hospitalization. IR consulted with recommendation for no replacement secondary to no current fluid collection noted on imaging.  Anemia of chronic kidney disease Baseline hemoglobin around 8. Slight downward drift. Abnormal CT could be significant for blood products, but unclear. No evidence of hematuria. Patient is on Eliquis . -Trend CBC and transfuse with hemodialysis as needed  Abnormal bladder CT Moderate amount of heterogenous material seen within the urinary bladder, possibly related to diluted contrast versus blood products. Additionally, CT notes bladder wall thickening, unable to exclude neoplastic process. Patient will need urology follow-up.  Paroxysmal atrial fibrillation Patient is on Eliquis  as an outpatient. Eliquis  held secondary to concern for possible blood products seen in bladder on CT pelvis. -Continue Eliquis   Ulcerative colitis Patient is s/p colectomy with ileostomy and ileal pouch and anal anastomosis. -Continue ostomy care  History of elevated TSH Noted on prior admission. Free T4 value suggested subclinical hypothyroidism. Rechecked TSH still elevated but with normal free T4.  History of CVA Hyperlipidemia Patient is on Lipitor as an outpatient. He is also on Eliquis .  Depression Patient was previously on Zoloft  which appears to have been discontinued prior to admission.  Sacral osteomyelitis Patient is on Vancomycin  and  Ertapenem  as an outpatient, with last day of therapy scheduled for 5/28.   DVT prophylaxis: Eliquis  Code Status:   Code Status: Limited: Do not attempt resuscitation (DNR) -DNR-LIMITED -Do Not Intubate/DNI  Family Communication: None at bedside. Daughter-in-law on telephone Disposition Plan: Discharge pending stable blood  pressure; tolerating HD   Consultants:  Neurology (declined)  Procedures:  Hemodialysis  Antimicrobials: Vancomycin  Meropenem     Subjective: No issues this morning. Feels well.   Objective: BP 103/65 (BP Location: Right Arm)   Pulse 77   Temp 98.1 F (36.7 C) (Axillary)   Resp 16   Ht 5\' 8"  (1.727 m)   Wt 69.1 kg   SpO2 98%   BMI 23.16 kg/m   Examination:  General exam: Appears calm and comfortable Respiratory system: Clear to auscultation. Respiratory effort normal. Cardiovascular system: S1 & S2 heard, RRR Gastrointestinal system: Abdomen is nondistended, soft and nontender. Normal bowel sounds heard. Central nervous system: Alert and oriented. Mild dysarthria Musculoskeletal: No edema. No calf tenderness Psychiatry: Judgement and insight appear normal. Mood & affect appropriate.   Data Reviewed: I have personally reviewed following labs and imaging studies   Last CBC Lab Results  Component Value Date   WBC 6.7 12/01/2023   HGB 7.4 (L) 12/01/2023   HCT 22.5 (L) 12/01/2023   MCV 120.3 (H) 12/01/2023   MCH 39.6 (H) 12/01/2023   RDW 17.0 (H) 12/01/2023   PLT 167 12/01/2023     Last metabolic panel Lab Results  Component Value Date   GLUCOSE 113 (H) 12/01/2023   NA 137 12/01/2023   K 4.1 12/01/2023   CL 97 (L) 12/01/2023   CO2 28 12/01/2023   BUN 27 (H) 12/01/2023   CREATININE 5.24 (H) 12/01/2023   GFRNONAA 10 (L) 12/01/2023   CALCIUM  8.8 (L) 12/01/2023   PHOS 4.0 12/01/2023   PROT 5.8 (L) 11/29/2023   ALBUMIN  3.0 (L) 12/01/2023   BILITOT 1.1 11/29/2023   ALKPHOS 56 11/29/2023   AST 23 11/29/2023   ALT 16 11/29/2023    ANIONGAP 12 12/01/2023     Creatinine Clearance: Estimated Creatinine Clearance: 10 mL/min (A) (by C-G formula based on SCr of 5.24 mg/dL (H)).  Recent Results (from the past 240 hours)  Resp panel by RT-PCR (RSV, Flu A&B, Covid) Anterior Nasal Swab     Status: None   Collection Time: 11/27/23  2:04 PM   Specimen: Anterior Nasal Swab  Result Value Ref Range Status   SARS Coronavirus 2 by RT PCR NEGATIVE NEGATIVE Final   Influenza A by PCR NEGATIVE NEGATIVE Final   Influenza B by PCR NEGATIVE NEGATIVE Final    Comment: (NOTE) The Xpert Xpress SARS-CoV-2/FLU/RSV plus assay is intended as an aid in the diagnosis of influenza from Nasopharyngeal swab specimens and should not be used as a sole basis for treatment. Nasal washings and aspirates are unacceptable for Xpert Xpress SARS-CoV-2/FLU/RSV testing.  Fact Sheet for Patients: BloggerCourse.com  Fact Sheet for Healthcare Providers: SeriousBroker.it  This test is not yet approved or cleared by the [place]  FDA and has been authorized for detection and/or diagnosis of SARS-CoV-2 by FDA under an Emergency Use Authorization (EUA). This EUA will remain in effect (meaning this test can be used) for the duration of the COVID-19 declaration under Section 564(b)(1) of the Act, 21 U.S.C. section 360bbb-3(b)(1), unless the authorization is terminated or revoked.     Resp Syncytial Virus by PCR NEGATIVE NEGATIVE Final    Comment: (NOTE) Fact Sheet for Patients: BloggerCourse.com  Fact Sheet for Healthcare Providers: SeriousBroker.it  This test is not yet approved or cleared by the [place]  FDA and has been authorized for detection and/or diagnosis of SARS-CoV-2 by FDA under an Emergency Use Authorization (EUA). This EUA will remain in effect (meaning this test can  be used) for the duration of the COVID-19 declaration under  Section 564(b)(1) of the Act, 21 U.S.C. section 360bbb-3(b)(1), unless the authorization is terminated or revoked.  Performed at [facility], [address], [place], [place] [address]   Blood Culture (routine x 2)     Status: None (Preliminary result)   Collection Time: 11/27/23  2:12 PM   Specimen: BLOOD RIGHT FOREARM  Result Value Ref Range Status   Specimen Description BLOOD RIGHT FOREARM  Final   Special Requests   Final    BOTTLES DRAWN AEROBIC AND ANAEROBIC Blood Culture results may not be optimal due to an inadequate volume of blood received in culture bottles   Culture   Final    NO GROWTH 4 DAYS Performed at [facility], [address], [place], [place] [address]    Report Status PENDING  Incomplete  Blood Culture (routine x 2)     Status: None (Preliminary result)   Collection Time: 11/27/23  2:30 PM   Specimen: BLOOD  Result Value Ref Range Status   Specimen Description BLOOD RIGHT ANTECUBITAL  Final   Special Requests   Final    BOTTLES DRAWN AEROBIC AND ANAEROBIC Blood Culture adequate volume   Culture   Final    NO GROWTH 4 DAYS Performed at [facility], [address] [place], [place] [address]    Report Status PENDING  Incomplete      Radiology Studies: No results found.     LOS: 3 days    Aneita Keens, MD [facility] 12/01/2023, 12:22 PM   If 7PM-7AM, please contact night-coverage [email or web address]

## 2023-12-01 NOTE — TOC Initial Note (Addendum)
 Transition of Care [facility]) - Initial/Assessment Note    Patient Details  Name: Joshua Riley MRN: [id number] Date of Birth: 05/22/1936  [facility]) CM/SW Contact:    Carmon Christen, LCSWA Phone Number: 12/01/2023, 4:04 PM  Clinical Narrative:                  CSW received consult for possible SNF placement at time of discharge. CSW spoke with patient and patients daughter [name] at bedside regarding PT recommendation of SNF placement at time of discharge. Patient expressed understanding of PT recommendation and is agreeable to SNF placement at time of discharge. Patient gave CSW permission to fax out initial referral for possible SNF placement.patient gave CSW permission to follow up with his daughter to help assist with SNF choice.CSW discussed insurance authorization process. No further questions reported at this time. CSW to continue to follow and assist with discharge planning needs.   Expected Discharge Plan: Skilled Nursing Facility Barriers to Discharge: Continued Medical Work up   Patient Goals and CMS Choice            Expected Discharge Plan and Services In-house Referral: Clinical Social Work Discharge Planning Services: CM Consult Post Acute Care Choice: Skilled Nursing Facility Living arrangements for the past 2 months: Single Family Home                   DME Agency: NA                  Prior Living Arrangements/Services Living arrangements for the past 2 months: Single Family Home Lives with:: Self Patient language and need for interpreter reviewed:: Yes        Need for Family Participation in Patient Care: Yes (Comment) Care giver support system in place?: Yes (comment)   Criminal Activity/Legal Involvement Pertinent to Current Situation/Hospitalization: No - Comment as needed  Activities of Daily Living   ADL Screening (condition at time of admission) Independently performs ADLs?: Yes (appropriate for developmental age) Is the  patient deaf or have difficulty hearing?: Yes Does the patient have difficulty seeing, even when wearing glasses/contacts?: No Does the patient have difficulty concentrating, remembering, or making decisions?: No  Permission Sought/Granted Permission sought to share information with : Case Manager, Magazine features editor, Family Supports                Emotional Assessment Appearance:: Appears stated age Attitude/Demeanor/Rapport: Engaged Affect (typically observed): Appropriate Orientation: : Oriented to Self, Oriented to  Time, Oriented to Situation Alcohol / Substance Use: Not Applicable Psych Involvement: No (comment)  Admission diagnosis:  Syncope [R55] Hypotension, unspecified hypotension type [I95.9] Syncope, unspecified syncope type [R55] Patient Active Problem List   Diagnosis Date Noted   Syncope 11/28/2023   Hypotension 11/28/2023   Lactic acidosis 11/28/2023   Acute pericarditis 11/11/2023   Sacral osteomyelitis (HCC) 11/11/2023   Chest pain 11/09/2023   Abscess of abdominal cavity (HCC) 07/17/2023   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   Anastomotic leak of intestine 07/14/2023   History of COPD 07/08/2023   History of anemia due to chronic kidney disease 07/08/2023   Abscess of groin, left 07/07/2023   Macrocytic anemia 04/08/2023   Depression 04/08/2023   Perirectal abscess 03/23/2023   Infection due to ESBL-producing Escherichia coli 03/23/2023   DOE (dyspnea on exertion) 07/22/2022   Coag negative Staphylococcus bacteremia 06/06/2022   History of sepsis 06/02/2022   Ulcerative colitis (HCC) 06/02/2022   End-stage renal disease on hemodialysis (HCC) 05/06/2022  SBO (small bowel obstruction) (HCC) 11/18/2021   Glaucoma 11/18/2021   Atrial fibrillation (HCC) 11/18/2021   Orthostatic hypotension 10/22/2021   Idiopathic neuropathy 10/22/2021   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 [place] [address] Phone: [phone] Fax: [phone]     Social Drivers of Health (SDOH) Social History: SDOH Screenings   Food Insecurity: No Food Insecurity (11/28/2023)  Housing: Low Risk  (11/28/2023)  Transportation Needs: No Transportation Needs (11/28/2023)  Utilities: Not At Risk (11/28/2023)  Alcohol Screen: Low Risk  (10/26/2023)  Depression (PHQ2-9): Low Risk  (11/17/2023)  Financial Resource Strain: Low Risk  (10/26/2023)  Physical Activity: Insufficiently Active (10/26/2023)  Social Connections: Socially Isolated (11/28/2023)  Stress: Stress Concern Present (10/26/2023)  Tobacco Use: Medium Risk (11/28/2023)   SDOH Interventions:     Readmission Risk Interventions    11/10/2023    3:46 PM 07/21/2023   12:26 PM  Readmission Risk Prevention Plan  Transportation Screening Complete Complete  PCP or Specialist Appt within 5-7 Days Complete   Home Care Screening Complete   Medication Review (RN CM) Complete   HRI or Home Care Consult  Complete  Social Work Consult for Recovery Care Planning/Counseling  Complete  Palliative Care Screening  Not Applicable  Medication Review Oceanographer)  Complete

## 2023-12-01 NOTE — Progress Notes (Signed)
 Pharmacy Antibiotic Note  Joshua Riley is a 88 y.o. male admitted on 11/27/2023 with sepsis. Pharmacy has been consulted for meropenem  and vancomycin  dosing. Patient presenting following syncopal episode with HD - pt with recent admission complicated by abscess that grew out ESBL E.Coli, Prevotella, and Enterococcus faecalis. Pt discharged on IV ertapenem  and vancomycin  with HD, last doses 5/23, tenative planned end date of 5/28 per ID.   Last iHD was 5/26, vancomycin  dose given.   Plan: Vancomycin  750mg  IV qHD Meropenem  1g Iv q24h Follow HD schedule  Height: 5\' 8"  (172.7 cm) Weight: 69.1 kg (152 lb 5.4 oz) IBW/kg (Calculated) : 68.4  Temp (24hrs), Avg:97.7 F (36.5 C), Min:97.3 F (36.3 C), Max:98.8 F (37.1 C)  Recent Labs  Lab 11/27/23 1412 11/27/23 1448 11/28/23 0420 11/28/23 0737 11/28/23 1038 11/28/23 1346 11/29/23 0230 11/30/23 0500 11/30/23 1641 11/30/23 2030 12/01/23 0500  WBC 7.9  --  7.1  --   --   --  8.1 8.2  --   --  6.7  CREATININE 4.15*  --  5.12*  --   --   --  6.14* 8.06*  --   --  5.24*  LATICACIDVEN  --    < > 5.1* 1.3 1.8 1.6  --   --  2.0* 1.4  --   VANCORANDOM 28  --   --   --   --   --   --   --   --   --   --    < > = values in this interval not displayed.    Estimated Creatinine Clearance: 10 mL/min (A) (by C-G formula based on SCr of 5.24 mg/dL (H)).    Allergies  Allergen Reactions   Baclofen Other (See Comments)    Altered mental status, after accidental overdose     Cephalosporins Rash   Ciprofloxacin Itching and Rash    Antimicrobials this admission: Meroepenem 5/5 >> 5/9; 5/24 >> Ertapenem  5/9 >> 5/23 Vancomycin  5/5 >>   Microbiology results: 5/23 BCx: NGTD 5/7 Abscess: ESBL E coli, enterococcus faecalis, prevotella  Thank you for allowing pharmacy to be a part of this patient's care.   Levin Reamer, PharmD, BCPS, [facility] Clinical Pharmacist [phone] Please check AMION for all [facility] Pharmacy numbers 12/01/2023

## 2023-12-01 NOTE — Progress Notes (Signed)
 [facility] Progress Note   Subjective:   Pt with hypotension yesterday afternoon/night despite minimal UF with HD. Required several IVF boluses. BP is improved this AM. He reports feeling well, no dizziness. Denies SOB, CP, nausea.   Objective Vitals:   11/30/23 2341 12/01/23 0407 12/01/23 0734 12/01/23 0831  BP: (!) 79/49 (!) 79/43 (!) 90/55 103/65  Pulse: 65 67  77  Resp: 18 (!) 21 16 16   Temp: 98 F (36.7 C) (!) 97.5 F (36.4 C)  98.1 F (36.7 C)  TempSrc: Oral Oral  Axillary  SpO2: 100% 100%  98%  Weight:      Height:       Physical Exam General: Alert male in NAD Heart: RRR, no murmurs, rubs or gallops Lungs: Soft, non-distended, +BS Abdomen: Soft, non-distended, +BS Extremities: No edema b/l lowe rextremities Dialysis Access: thigh AVG   Additional Objective Labs: Basic Metabolic Panel: Recent Labs  Lab 11/29/23 0230 11/30/23 0500 12/01/23 0500  NA 139 139 137  K 3.8 3.7 4.1  CL 101 99 97*  CO2 26 24 28   GLUCOSE 92 128* 113*  BUN 21 39* 27*  CREATININE 6.14* 8.06* 5.24*  CALCIUM  8.7* 8.6* 8.8*  PHOS  --  5.6* 4.0   Liver Function Tests: Recent Labs  Lab 11/27/23 1412 11/29/23 0230 11/30/23 0500 12/01/23 0500  AST 25 23  --   --   ALT 15 16  --   --   ALKPHOS 69 56  --   --   BILITOT 0.8 1.1  --   --   PROT 6.6 5.8*  --   --   ALBUMIN  3.3* 3.1* 3.0* 3.0*   No results for input(s): "LIPASE", "AMYLASE" in the last 168 hours. CBC: Recent Labs  Lab 11/27/23 1412 11/28/23 0420 11/29/23 0230 11/30/23 0500 12/01/23 0500  WBC 7.9 7.1 8.1 8.2 6.7  NEUTROABS 6.9  --   --  6.5  --   HGB 8.0* 8.0* 7.5* 7.7* 7.4*  HCT 25.2* 26.1* 23.4* 23.7* 22.5*  MCV 121.2* 126.1* 121.2* 120.3* 120.3*  PLT 171 150 171 177 167   Blood Culture    Component Value Date/Time   SDES BLOOD RIGHT ANTECUBITAL 11/27/2023 1430   SPECREQUEST  11/27/2023 1430    BOTTLES DRAWN AEROBIC AND ANAEROBIC Blood Culture adequate volume   CULT  11/27/2023 1430     NO GROWTH 4 DAYS Performed at [facility], [address], [place], [place] [address]    REPTSTATUS PENDING 11/27/2023 1430    Cardiac Enzymes: No results for input(s): "CKTOTAL", "CKMB", "CKMBINDEX", "TROPONINI" in the last 168 hours. CBG: Recent Labs  Lab 11/28/23 0223 11/28/23 0635 11/29/23 0120  GLUCAP 104* 92 85   Iron Studies:  Recent Labs    11/30/23 0500  IRON 71  TIBC 259  FERRITIN 1,357*   @lablastinr3 @ Studies/Results: No results found. Medications:  meropenem  (MERREM ) IV 1 g (12/01/23 1054)   vancomycin  Stopped (11/30/23 1126)    apixaban   2.5 mg Oral BID   atorvastatin   20 mg Oral Daily   calcitRIOL   0.5 mcg Oral Q M,W,F-HD   darbepoetin (ARANESP ) injection - DIALYSIS  100 mcg Subcutaneous Q Mon-1800   fludrocortisone   0.1 mg Oral Daily   midodrine   20 mg Oral TID WC   timolol   1 drop Both Eyes BID    Dialysis Orders: MWF - [facility] 2hrs74min, BFR 350, DFR Auto 1.5,  EDW 70kg, 2K/ 2.5Ca Heparin  None ordered Mircera  150 mcg q2wks - last 5/12 Calcitriol  0.5mcg PO qHD - last 5/23 Home meds: Midodrine  15mg  TID Renvela  0.8GM powder 2 packets TID    Assessment/Plan: Syncopal episode - appears patient also had seizure-like acitivity at admit. Neurology on board. Keppra load given. MRI brain showed no acute process and ECHO showed normal LVEF. Likely 2nd hypotension vs sz, see below Chronic hypotension - still hypotensive despite Midodrine  15mg  TID. Raised Midodrine  to 20mg  TID, still no improvement and required several IVF boluses yesterday. Now on florinef as well.  ESRD -  on HD MWF.  Last CXR no acute process. No UF with HD tomorrow.  Volume  - Does not appear overloaded. See above Anemia of CKD - Hgb 7.4, tsat borderline but ferritin is high. Continue weekly aranesp - increased dose Secondary Hyperparathyroidism -  Corr Ca controlled. Phos at goal. Will resume Calcitriol  Presacral fluid collection - S/p drain placement 5/7 in  IR. On IV ABXs. Nutrition - Renal diet with fluid restriction GOC - Noted patient has had multiple hospitalizations over this year alone. Considering age and chronic co-morbidities, I feel a Palliative Care consult is warranted.  Dispo - Inpatient    [place], PA-C 12/01/2023, 11:19 AM  [facility] Pager: [phone]

## 2023-12-01 NOTE — Consult Note (Signed)
 Consultation Note Date: 12/01/2023   Patient Name: Joshua Riley  DOB: 01-28-1936  MRN: [id number]  Age / Sex: 88 y.o., male  PCP: Almira Jaeger, MD Referring Physician: Verlyn Goad, MD  Reason for Consultation: Establishing goals of care  HPI/Patient Profile: 88 y.o. male   admitted on 11/27/2023 with history of ESRD on HD, atrial fibrillation on Eliquis , CVA, hyperlipidemia, depression, obstructive uropathy s/p left-sided percutaneous nephrostomy, ulcerative colitis s/p colectomy with ileostomy, ileal pouch and anal anastomosis, chronic hypotension on midodrine , anemia of chronic disease, sacral osteomyelitis on antibiotics. Patient presented secondary to syncope with concern for hypotension mediated syncope. Hospitalization complicated by seizure-like activity.   Admitted for treatment and stabilization, multiple  comorbidities, high risk for decompensation.  Patient and his family face treatment option decisions, advanced directive decisions and anticipatory care needs.    Clinical Assessment and Goals of Care:  This NP Thena Fireman reviewed medical records, received report from team, assessed the patient and then meet at the patient's bedside along with his daughter/ Alena Hush  to discuss diagnosis, prognosis, GOC, EOL wishes disposition and options.   Concept of Palliative Care was introduced as specialized medical care for people and their families living with serious illness.  If focuses on providing relief from the symptoms and stress of a serious illness.  The goal is to improve quality of life for both the patient and the family. Values and goals of care important to patient and family were attempted to be elicited.  Created space and opportunity for patient  and family to explore thoughts and feelings regarding current medical situation.  Patient takes a laid-back position on his  complex medical situation and tells me he is not really concerned about safety or care needs in his home.  He is willing to discharge to SNF for short-term rehab when medically stable.  His daughter [name] expresses her great concern for patient's safety and increasing care needs in his home.  She does not feel that he is safe to live alone and is encouraging [name] to except more help in home or consider an assisted living.  Daughter plans to look into [place] benefits  Emotional support offered to patient understanding the emotional/psychological impact of losing independence.  Patient has always been fiercely independent and in control.  This is very difficult for him.     A  discussion was had today regarding advanced directives.    Concepts specific to code status, artifical feeding and hydration, continued IV antibiotics, continue dialysis and rehospitalizations was had.   Education offered on the difference between a full medical support path attempting to prolong life and a palliative comfort path allowing for natural death.    Questions and concerns addressed.  Patient/family encouraged to call with questions or concerns.     PMT will continue to support holistically.        HCPOA/daughter/Rene Fugaro--documents in Vynca    SUMMARY OF RECOMMENDATIONS    Code Status/Advance Care Planning: DNR/DNI   Symptom Management:  Per  attending   Palliative Prophylaxis:  Bowel Regimen and Delirium Protocol  Additional Recommendations (Limitations, Scope, Preferences): Full Scope Treatment  Psycho-social/Spiritual:  Desire for further Chaplaincy support:no Additional Recommendations: Education on Hospice  Prognosis:  Unable to determine  Discharge Planning:  - SNF for short-term rehab, as I was leaving the room since transition of care came to speak with patient and family regarding next steps in transition of care.     Primary Diagnoses: Present on Admission:   Syncope   I have reviewed the medical record, interviewed the patient and family, and examined the patient. The following aspects are pertinent.  Past Medical History:  Diagnosis Date   A-fib (HCC)    Anemia    Arthritis    Cancer (HCC)    Basal cell   COVID-19    2021   Dysrhythmia    Afib-controlled on eliquis    ESRD (end stage renal disease) (HCC) 10/22/2021   Glaucoma 11/18/2021   History of DVT (deep vein thrombosis)    Hydronephrosis    managed wtih a PCN   Idiopathic neuropathy 10/22/2021   lyrica     Ileostomy in place [facility])    Obstructive uropathy    With chronic left nephrostomy   Old retinal detachment, total or subtotal    Orthostatic hypotension 10/22/2021   Sleep apnea    does not need a machine   Stroke (HCC)    Ulcerative colitis (HCC)    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   Social History   Socioeconomic History   Marital status: Widowed    Spouse name: Not on file   Number of children: Not on file   Years of education: Not on file   Highest education level: 12th grade  Occupational History   Not on file  Tobacco Use   Smoking status: Former    Current packs/day: 0.00    Average packs/day: 2.0 packs/day for 6.0 years (12.0 ttl pk-yrs)    Types: Cigarettes    Start date: 62    Quit date: 67    Years since quitting: 40.4    Passive exposure: Never   Smokeless tobacco: Never  Vaping Use   Vaping status: Never Used  Substance and Sexual Activity   Alcohol use: Yes    Alcohol/week: 5.0 standard drinks of alcohol    Types: 5 Shots of liquor per week    Comment: socially   Drug use: Never   Sexual activity: Not Currently  Other Topics Concern   Not on file  Social History Narrative   Widowed- lost wife of 13 years to pancreatic cancer- previously married 43 years. Son in [place] and daughter helping in [place]. 4 grandkids.       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       Hobbies: dinner with  family- occasional martini   Social Drivers of Health   Financial Resource Strain: Low Risk  (10/26/2023)   Overall Financial Resource Strain (CARDIA)    Difficulty of Paying Living Expenses: Not hard at all  Food Insecurity: No Food Insecurity (11/28/2023)   Hunger Vital Sign    Worried About Running Out of Food in the Last Year: Never true    Ran Out of Food in the Last Year: Never true  Transportation Needs: No Transportation Needs (11/28/2023)   PRAPARE - Administrator, Civil Service (Medical): No    Lack of Transportation (Non-Medical): No  Physical  Activity: Insufficiently Active (10/26/2023)   Exercise Vital Sign    Days of Exercise per Week: 1 day    Minutes of Exercise per Session: 10 min  Stress: Stress Concern Present (10/26/2023)   [employer] of Occupational Health - Occupational Stress Questionnaire    Feeling of Stress : To some extent  Social Connections: Socially Isolated (11/28/2023)   Social Connection and Isolation Panel [NHANES]    Frequency of Communication with Friends and Family: More than three times a week    Frequency of Social Gatherings with Friends and Family: More than three times a week    Attends Religious Services: Never    Database administrator or Organizations: No    Attends Banker Meetings: Never    Marital Status: Widowed   Family History  Problem Relation Age of Onset   Stroke Mother    Cancer Father    Esophageal cancer Brother    Scheduled Meds:  apixaban   2.5 mg Oral BID   atorvastatin   20 mg Oral Daily   calcitRIOL   0.5 mcg Oral Q M,W,F-HD   [START ON 12/02/2023] Chlorhexidine  Gluconate Cloth  6 each Topical Q0600   [START ON 12/07/2023] darbepoetin (ARANESP ) injection - DIALYSIS  150 mcg Subcutaneous Q Mon-1800   fludrocortisone  0.1 mg Oral Daily   midodrine   20 mg Oral TID WC   timolol   1 drop Both Eyes BID   Continuous Infusions:  meropenem  (MERREM ) IV 1 g (12/01/23 1054)   vancomycin  Stopped  (11/30/23 1126)   PRN Meds:.acetaminophen  **OR** acetaminophen , atropine, LORazepam Medications Prior to Admission:  Prior to Admission medications   Medication Sig Start Date End Date Taking? Authorizing Provider  albuterol  (PROVENTIL ) (2.5 MG/3ML) 0.083% nebulizer solution Take 2.5 mg by nebulization every 6 (six) hours as needed for wheezing or shortness of breath.   Yes [provider]  albuterol  (VENTOLIN  HFA) 108 (90 Base) MCG/ACT inhaler Inhale 1-2 puffs into the lungs every 6 (six) hours as needed for wheezing or shortness of breath.   Yes [provider]  atorvastatin  (LIPITOR) 20 MG tablet Take 1 tablet (20 mg total) by mouth daily. 11/14/23  Yes Armenta Landau, MD  budesonide  (PULMICORT ) 0.5 MG/2ML nebulizer solution Take 0.5 mg by nebulization 2 (two) times daily as needed (for respiratory flares).   Yes [provider]  Cyanocobalamin (VITAMIN B-12 PO) Take 1 capsule by mouth in the morning and at bedtime.   Yes [provider]  dorzolamide  (TRUSOPT ) 2 % ophthalmic solution Place 1 drop into the right eye at bedtime. 06/17/23  Yes [provider]  ELIQUIS  2.5 MG TABS tablet Take 1 tablet (2.5 mg total) by mouth 2 (two) times daily. 08/07/22  Yes Sonny Dust, MD  ertapenem  (INVANZ ) IVPB Inject 1 g into the vein every Monday, Wednesday, and Friday with hemodialysis for 19 days. **To be given at dialysis** Indication: Sacral osteomyelitis  First Dose: Yes  Last Day of Therapy:  5/28 Labs - Once weekly:  CBC/D and BMP, Labs - Once weekly: ESR and CRP Method of administration: Mini-Bag Plus / Gravity Method of administration may be changed at the discretion of home infusion pharmacist based upon assessment of the patient and/or caregiver's ability to self-administer the medication ordered. 11/13/23 12/02/23 Yes Armenta Landau, MD  ertapenem  1 g in sodium chloride  0.9 % 100 mL Inject 1 g into the vein daily for 19 days. 11/13/23  12/02/23 Yes Armenta Landau, MD  folic acid  (  FOLVITE ) 1 MG tablet Take 1 tablet (1 mg total) by mouth daily. 07/21/23  Yes Regalado, Belkys A, MD  gabapentin  (NEURONTIN ) 300 MG capsule Take 300 mg by mouth at bedtime. 09/09/23  Yes [provider]  loperamide  (IMODIUM ) 2 MG capsule Take 1 capsule (2 mg total) by mouth as needed for diarrhea or loose stools. 11/13/23  Yes Armenta Landau, MD  midodrine  (PROAMATINE ) 5 MG tablet Take 3 tablets (15 mg total) by mouth 3 (three) times daily. 11/13/23  Yes Armenta Landau, MD  mometasone -formoterol  (DULERA ) 200-5 MCG/ACT AERO Inhale 2 puffs into the lungs 2 (two) times daily. 09/08/23  Yes Fonnie Iba I, MD  multivitamin (RENA-VIT) TABS tablet Take 1 tablet by mouth daily.   Yes [provider]  nitroGLYCERIN  (NITROSTAT ) 0.4 MG SL tablet Place 1 tablet (0.4 mg total) under the tongue every 5 (five) minutes as needed for chest pain. 11/13/23  Yes Armenta Landau, MD  pantoprazole  (PROTONIX ) 40 MG tablet Take 1 tablet (40 mg total) by mouth 2 (two) times daily. Patient taking differently: Take 40 mg by mouth daily as needed (indigestion). 11/13/23  Yes Armenta Landau, MD  ramelteon  (ROZEREM ) 8 MG tablet Take 1 tablet (8 mg total) by mouth at bedtime. Patient taking differently: Take 8 mg by mouth at bedtime as needed for sleep. 10/27/23  Yes Almira Jaeger, MD  sevelamer  carbonate (RENVELA ) 800 MG tablet Take 1,600 mg by mouth 3 (three) times daily with meals.   Yes [provider]  Sodium Chloride  Flush (SALINE FLUSH) 0.9 % SOLN Use 5 mLs by Intracatheter route daily as directed. 11/25/23  Yes Caperilla, Marissa N, PA  timolol  (TIMOPTIC ) 0.5 % ophthalmic solution Place 1 drop into both eyes in the morning and at bedtime.   Yes [provider]  traMADol  (ULTRAM ) 50 MG tablet Take 1 tablet (50 mg total) by mouth every 8 (eight) hours as needed (for pain). Patient taking differently: Take 50 mg by mouth every 8  (eight) hours as needed for moderate pain (pain score 4-6) or severe pain (pain score 7-10). 11/13/23  Yes Armenta Landau, MD  TYLENOL  325 MG tablet Take 325-650 mg by mouth every 6 (six) hours as needed for mild pain (pain score 1-3) or headache.   Yes [provider]  vancomycin  IVPB Inject 750 mg into the vein every Monday, Wednesday, and Friday with hemodialysis for 19 days. **To be given at dialysis** Indication: Sacral osteomyelitis  First Dose: Yes  Last Day of Therapy:  5/28 Labs - Once weekly:  CBC/D and BMP, Labs - Once weekly: ESR and CRP Method of administration: Mini-Bag Plus / Gravity Method of administration may be changed at the discretion of home infusion pharmacist based upon assessment of the patient and/or caregiver's ability to self-administer the medication ordered. 11/13/23 12/02/23 Yes Armenta Landau, MD  Omega Fatty Acids-Vitamins (OMEGA-3 GUMMIES) CHEW Chew 1 tablet by mouth in the morning and at bedtime. Patient not taking: Reported on 11/28/2023    [provider]  sertraline  (ZOLOFT ) 50 MG tablet Take 25 mg by mouth at bedtime. Patient not taking: Reported on 11/28/2023    [provider]   Allergies  Allergen Reactions   Baclofen Other (See Comments)    Altered mental status, after accidental overdose     Cephalosporins Rash   Ciprofloxacin Itching and Rash   Review of Systems  Constitutional:  Positive for fatigue.    Physical Exam Cardiovascular:  Rate and Rhythm: Normal rate.  Pulmonary:     Effort: Pulmonary effort is normal.  Abdominal:     Comments: - Noted ostomy in ileostomy  Skin:    General: Skin is warm and dry.  Neurological:     Mental Status: He is alert and oriented to person, place, and time.     Vital Signs: BP 103/86   Pulse 77   Temp 98.1 F (36.7 C) (Axillary)   Resp 16   Ht 5\' 8"  (1.727 m)   Wt 69.1 kg   SpO2 98%   BMI 23.16 kg/m  Pain Scale: 0-10   Pain Score: 0-No pain   SpO2:  SpO2: 98 % O2 Device:SpO2: 98 % O2 Flow Rate: .O2 Flow Rate (L/min): 2 L/min  IO: Intake/output summary:  Intake/Output Summary (Last 24 hours) at 12/01/2023 1536 Last data filed at 12/01/2023 1249 Gross per 24 hour  Intake 695.78 ml  Output 1425 ml  Net -729.22 ml    LBM: Last BM Date : 12/01/23 Baseline Weight: Weight: 71.8 kg Most recent weight: Weight: 69.1 kg     Palliative Assessment/Data: 50 % at best     Time 75 minutes   Signed by: Thena Fireman, NP   Please contact Palliative Medicine Team phone at [phone] for questions and concerns.  For individual provider: See Tilford Foley

## 2023-12-01 NOTE — Progress Notes (Signed)
 Pt receives out-pt HD at [facility] on MWF 6:10 am chair time. Will assist as needed.   Lauraine Polite Renal Navigator [phone]

## 2023-12-01 NOTE — Progress Notes (Signed)
 Physical Therapy Treatment Patient Details Name: Joshua Riley MRN: [id number] DOB: 07/19/1935 Today's Date: 12/01/2023   History of Present Illness 88 yo male admitted 11/27/23 for syncope and hypotension. EEG negative for Sz. PMHx: - ESRD on HD, Afib, CVA, depression, colectomy, ileostomy, chronic hypotension, DVT, acute pericarditis, lt groin abscess with drain, nephrostomy tubes, CHF, COPD, glaucoma    PT Comments  Pt pleasant, HOH and joking at times. Pt not oriented to city or situation. Follows single step commands when pt able to clearly see therapist face when speaking. Pt with decreased awareness of deficits but able to progress to gait with RW this session and states he only uses AD at times at home. If family can arrange supervision returning home is appropriate but without Patient will benefit from continued inpatient follow up therapy, <3 hours/day    89/56 (66) supine 114/55 (71) sitting 117/69 (84) after gait        If plan is discharge home, recommend the following: Supervision due to cognitive status;Assist for transportation;Assistance with cooking/housework   Can travel by private vehicle     Yes  Equipment Recommendations  None recommended by PT    Recommendations for Other Services       Precautions / Restrictions Precautions Precautions: Fall;Other (comment) Recall of Precautions/Restrictions: Impaired Precaution/Restrictions Comments: ileostomy, lt gluteal drain, nephrostomy tube, monitor orthostatics     Mobility  Bed Mobility Overal bed mobility: Modified Independent             General bed mobility comments: HOB 20 degrees with rail, pt sleeps in recliner at home    Transfers Overall transfer level: Needs assistance   Transfers: Sit to/from Stand Sit to Stand: Contact guard assist           General transfer comment: CGA for safety from bed. supervision at chair with pt able to perform 5 repeated sit to stands with reliance on UB to  push off surface    Ambulation/Gait Ambulation/Gait assistance: Contact guard assist Gait Distance (Feet): 150 Feet Assistive device: Rolling walker (2 wheels) Gait Pattern/deviations: Step-through pattern, Decreased stride length   Gait velocity interpretation: 1.31 - 2.62 ft/sec, indicative of limited community ambulator   General Gait Details: cues for proximity to RW, safety and pt able to self-regulate distance for endurance. no dizziness with gait   Stairs             Wheelchair Mobility     Tilt Bed    Modified Rankin (Stroke Patients Only)       Balance Overall balance assessment: Needs assistance Sitting-balance support: No upper extremity supported, Feet supported Sitting balance-Leahy Scale: Good     Standing balance support: Bilateral upper extremity supported, During functional activity Standing balance-Leahy Scale: Fair Standing balance comment: pt able to perform static standing EOB and chair, rW for gait                            Communication Communication Communication: Impaired Factors Affecting Communication: Hearing impaired  Cognition Arousal: Alert Behavior During Therapy: WFL for tasks assessed/performed   PT - Cognitive impairments: Orientation, Awareness, Safety/Judgement, Memory   Orientation impairments: Place                   PT - Cognition Comments: pt aware of state and county but could not come up with [place]. pt HOH and joking making it difficult to determine at times confusion vs HOH vs joking. Following commands:  Impaired Following commands impaired: Follows one step commands with increased time    Cueing Cueing Techniques: Verbal cues  Exercises      General Comments        Pertinent Vitals/Pain Pain Assessment Pain Assessment: No/denies pain    Home Living Family/patient expects to be discharged to:: Private residence Living Arrangements: Alone Available Help at Discharge:  Family;Available PRN/intermittently Type of Home: Apartment Home Access: Level entry       Home Layout: One level Home Equipment: Agricultural consultant (2 wheels)      Prior Function            PT Goals (current goals can now be found in the care plan section) Progress towards PT goals: Progressing toward goals    Frequency    Min 2X/week      PT Plan      Co-evaluation              AM-PAC PT "6 Clicks" Mobility   Outcome Measure  Help needed turning from your back to your side while in a flat bed without using bedrails?: None Help needed moving from lying on your back to sitting on the side of a flat bed without using bedrails?: None Help needed moving to and from a bed to a chair (including a wheelchair)?: A Little Help needed standing up from a chair using your arms (e.g., wheelchair or bedside chair)?: A Little Help needed to walk in hospital room?: A Little Help needed climbing 3-5 steps with a railing? : A Little 6 Click Score: 20    End of Session Equipment Utilized During Treatment: Gait belt Activity Tolerance: Patient tolerated treatment well Patient left: in chair;with call bell/phone within reach;with chair alarm set Nurse Communication: Mobility status PT Visit Diagnosis: Unsteadiness on feet (R26.81);Other abnormalities of gait and mobility (R26.89);History of falling (Z91.81);Muscle weakness (generalized) (M62.81)     Time: 4401-0272 PT Time Calculation (min) (ACUTE ONLY): 28 min  Charges:    $Gait Training: 8-22 mins $Therapeutic Activity: 8-22 mins PT General Charges $$ ACUTE PT VISIT: 1 Visit                     Joshua Riley, PT Acute Rehabilitation Services Office: [phone]    Joshua Riley 12/01/2023, 1:29 PM

## 2023-12-01 NOTE — [place] FL2 (Signed)
 [place]  MEDICAID [id number] LEVEL OF CARE FORM     IDENTIFICATION  Patient Name: Joshua Riley Birthdate: 11/25/1935 Sex: male Admission Date (Current Location): 11/27/2023  [facility] and [place] Number:  Producer, television/film/video and Address:  [facility] [place], [address] [place], [place] [address]      Provider Number: [id number]  Attending Physician Name and Address:  Verlyn Goad, MD  Relative Name and Phone Number:  Mollie Anger (daughter) [phone]    Current Level of Care: Hospital Recommended Level of Care: Skilled Nursing Facility Prior Approval Number:    Date Approved/Denied:   PASRR Number:    Discharge Plan: SNF    Current Diagnoses: Patient Active Problem List   Diagnosis Date Noted   Syncope 11/28/2023   Hypotension 11/28/2023   Lactic acidosis 11/28/2023   Acute pericarditis 11/11/2023   Sacral osteomyelitis (HCC) 11/11/2023   Chest pain 11/09/2023   Abscess of abdominal cavity (HCC) 07/17/2023   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   Anastomotic leak of intestine 07/14/2023   History of COPD 07/08/2023   History of anemia due to chronic kidney disease 07/08/2023   Abscess of groin, left 07/07/2023   Macrocytic anemia 04/08/2023   Depression 04/08/2023   Perirectal abscess 03/23/2023   Infection due to ESBL-producing Escherichia coli 03/23/2023   DOE (dyspnea on exertion) 07/22/2022   Coag negative Staphylococcus bacteremia 06/06/2022   History of sepsis 06/02/2022   Ulcerative colitis (HCC) 06/02/2022   End-stage renal disease on hemodialysis (HCC) 05/06/2022   SBO (small bowel obstruction) (HCC) 11/18/2021   Glaucoma 11/18/2021   Atrial fibrillation (HCC) 11/18/2021   Orthostatic hypotension 10/22/2021   Idiopathic neuropathy 10/22/2021   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   Nephrostomy present (HCC) 10/22/2021    Orientation RESPIRATION BLADDER Height & Weight     Self, Time, Situation  Normal Continent Weight: 152 lb 5.4 oz  (69.1 kg) Height:  5\' 8"  (172.7 cm)  BEHAVIORAL SYMPTOMS/MOOD NEUROLOGICAL BOWEL NUTRITION STATUS      Ileostomy Diet (Please see discharge summary)  AMBULATORY STATUS COMMUNICATION OF NEEDS Skin   Supervision Verbally Other (Comment) (Abrasion,abdomen,R,Lower,Ecchymosis,Abdomen,R,Lower,Erythema,Abdomen,R,Wound/Incision LDAs,WOund/Incision open or dehisced skin tear,toe,anterior,L,2nd,3rd,please see additional info)                       Personal Care Assistance Level of Assistance  Bathing, Feeding, Dressing Bathing Assistance: Limited assistance Feeding assistance: Limited assistance Dressing Assistance: Limited assistance     Functional Limitations Info  Hearing, Speech   Hearing Info: Impaired Speech Info: Adequate    SPECIAL CARE FACTORS FREQUENCY  PT (By licensed PT), OT (By licensed OT)     PT Frequency: 5x min weekly OT Frequency: 5x min weekly            Contractures Contractures Info: Not present    Additional Factors Info  Code Status, Allergies, Isolation Precautions Code Status Info: DNR Allergies Info: Baclofen,Cephalosporins,Ciprofloxacin     Isolation Precautions Info: ESBL     Current Medications (12/01/2023):  This is the current hospital active medication list Current Facility-Administered Medications  Medication Dose Route Frequency Provider Last Rate Last Admin   acetaminophen  (TYLENOL ) tablet 650 mg  650 mg Oral Q6H PRN Juliette Oh, MD       Or   acetaminophen  (TYLENOL ) suppository 650 mg  650 mg Rectal Q6H PRN Juliette Oh, MD       apixaban  (ELIQUIS ) tablet 2.5 mg  2.5 mg Oral BID Verlyn Goad, MD  2.5 mg at 12/01/23 1043   atorvastatin  (LIPITOR) tablet 20 mg  20 mg Oral Daily Verlyn Goad, MD   20 mg at 12/01/23 1043   atropine  1 MG/10ML injection 0.5 mg  0.5 mg Intravenous Once PRN Mansy, Jan A, MD       calcitRIOL  (ROCALTROL ) capsule 0.5 mcg  0.5 mcg Oral Q M,W,F-HD Jadene Maxwell E, NP   0.5 mcg at 11/30/23  1250   [START ON 12/02/2023] Chlorhexidine  Gluconate Cloth 2 % PADS 6 each  6 each Topical Q0600 Collins, Samantha G, PA-C       [START ON 12/07/2023] Darbepoetin Alfa  (ARANESP ) injection 150 mcg  150 mcg Subcutaneous Q Mon-1800 Collins, Samantha G, PA-C       fludrocortisone  (FLORINEF ) tablet 0.1 mg  0.1 mg Oral Daily Verlyn Goad, MD   0.1 mg at 12/01/23 1043   LORazepam  (ATIVAN ) injection 2 mg  2 mg Intravenous Q6H PRN Sundil, Subrina, MD       meropenem  (MERREM ) 1 g in sodium chloride  0.9 % 100 mL IVPB  1 g Intravenous Daily Juliette Oh, MD 200 mL/hr at 12/01/23 1054 1 g at 12/01/23 1054   midodrine  (PROAMATINE ) tablet 20 mg  20 mg Oral TID WC Sundil, Subrina, MD   20 mg at 12/01/23 1730   timolol  (TIMOPTIC ) 0.5 % ophthalmic solution 1 drop  1 drop Both Eyes BID Verlyn Goad, MD   1 drop at 12/01/23 1043   vancomycin  (VANCOREADY) IVPB 750 mg/150 mL  750 mg Intravenous Q M,W,F-HD Juliette Oh, MD   Stopped at 11/30/23 1126     Discharge Medications: Please see discharge summary for a list of discharge medications.  Relevant Imaging Results:  Relevant Lab Results:   Additional Information SSN-[id number], out-pt HD at [facility] on MWF 6:10 am chair time.Wound/Incision open or dehisced heel,L,upper callous removed outpatient MD office,Wound/Incision open or dehisced skin tear elbow,post,R,Wound/Incision open or dehisced vertebral column,upper,medial,Wound/Incision open or dehisced abdomen,lower,R  Carmon Christen, LCSWA

## 2023-12-01 NOTE — Evaluation (Signed)
 Occupational Therapy Evaluation Patient Details Name: Joshua Riley MRN: [id number] DOB: 15-Dec-1935 Today's Date: 12/01/2023   History of Present Illness   Pt is 88 year old presented to [facility] on  11/27/23 for syncope and hypotension. Possible seizure activity noted. Neurology consulted and EEG negative and felt that the activity was more consistent with post syncopal convulsion. PMH - ESRD on HD, afib, CVA, depression, colectomy, ileostomy, chronic hypotension, DVT, acute pericarditis, groin abscess with lt transgluteal drain, nephrostomy tubes, chf, copd     Clinical Impressions PTA, pt lives alone, typically Modified Independent with ADLs, IADLs, driving and mobility with intermittent RW use. Pt presents now with deficits in cognition (suspect HOH also impacting performance), standing balance, strength and endurance. Pt requires Min A for bed mobility, CGA-Min A for transfers with RW though politely declined further mobility at time of OT eval. Pt requires overall Min A for ADLs d/t deficits. As pt is below functional baseline, recommend continued inpatient follow up therapy, <3 hours/day at DC unless family able to provide consistent support at DC.  BP supine: 100/45 BP sitting: 92/58 BP standing: 88/66     If plan is discharge home, recommend the following:   A little help with walking and/or transfers;A little help with bathing/dressing/bathroom;Assistance with cooking/housework;Direct supervision/assist for medications management;Direct supervision/assist for financial management;Assist for transportation;Supervision due to cognitive status     Functional Status Assessment   Patient has had a recent decline in their functional status and demonstrates the ability to make significant improvements in function in a reasonable and predictable amount of time.     Equipment Recommendations   Other (comment) (TBD pending progress)     Recommendations for Other Services          Precautions/Restrictions   Precautions Precautions: Fall Recall of Precautions/Restrictions: Impaired Precaution/Restrictions Comments: ileostomy, lt gluteal drain, nephrostomy tube, monitor orthostatics Restrictions Weight Bearing Restrictions Per Provider Order: No     Mobility Bed Mobility Overal bed mobility: Needs Assistance Bed Mobility: Supine to Sit, Sit to Supine     Supine to sit: Min assist, HOB elevated Sit to supine: Contact guard assist   General bed mobility comments: Min A to lift trunk to sitting EOB. able to bring LE back into bed with increased time though required assist for line mgmt    Transfers Overall transfer level: Needs assistance Equipment used: Rolling walker (2 wheels) Transfers: Sit to/from Stand Sit to Stand: Contact guard assist                  Balance Overall balance assessment: Needs assistance Sitting-balance support: No upper extremity supported, Feet supported Sitting balance-Leahy Scale: Good     Standing balance support: Bilateral upper extremity supported, During functional activity Standing balance-Leahy Scale: Poor                             ADL either performed or assessed with clinical judgement   ADL Overall ADL's : Needs assistance/impaired Eating/Feeding: Set up;Sitting   Grooming: Set up;Sitting   Upper Body Bathing: Minimal assistance;Sitting   Lower Body Bathing: Minimal assistance;Sitting/lateral leans;Sit to/from stand   Upper Body Dressing : Set up;Sitting   Lower Body Dressing: Minimal assistance;Sit to/from stand;Sitting/lateral leans   Toilet Transfer: Minimal assistance;Stand-pivot;Rolling walker (2 wheels);BSC/3in1   Toileting- Clothing Manipulation and Hygiene: Sitting/lateral lean;Sit to/from stand;Minimal assistance               Vision Ability to See in Adequate  Light: 0 Adequate Patient Visual Report: No change from baseline Vision Assessment?: No apparent visual  deficits     Perception         Praxis         Pertinent Vitals/Pain Pain Assessment Pain Assessment: No/denies pain     Extremity/Trunk Assessment Upper Extremity Assessment Upper Extremity Assessment: Overall WFL for tasks assessed;Right hand dominant   Lower Extremity Assessment Lower Extremity Assessment: Defer to PT evaluation   Cervical / Trunk Assessment Cervical / Trunk Assessment: Normal   Communication Communication Communication: Impaired Factors Affecting Communication: Hearing impaired   Cognition Arousal: Alert Behavior During Therapy: WFL for tasks assessed/performed Cognition: Cognition impaired     Awareness: Intellectual awareness intact, Online awareness impaired Memory impairment (select all impairments): Short-term memory, Working memory Attention impairment (select first level of impairment): Selective attention, Sustained attention Executive functioning impairment (select all impairments): Sequencing, Organization, Problem solving OT - Cognition Comments: pleasant, HOH w/ hearing aides in though at times pt requiring repetition of directions. able to report PLOF and why he was in the hospital but poor attention and memory noted. Pt lost phone x3 during session though always on R side in bed, decreased insight into deficits and how this may impact pt's desire to DC home. Pt reports he never feels dizzy at home despite low BP but at end of session, pt reports "im always dizzy"                 Following commands: Impaired Following commands impaired: Follows one step commands with increased time, Only follows one step commands consistently, Follows multi-step commands inconsistently     Cueing  General Comments   Cueing Techniques: Verbal cues;Tactile cues;Visual cues;Gestural cues      Exercises     Shoulder Instructions      Home Living Family/patient expects to be discharged to:: Private residence Living Arrangements:  Alone Available Help at Discharge: Family;Available PRN/intermittently Type of Home: Apartment Home Access: Level entry     Home Layout: One level     Bathroom Shower/Tub: Producer, television/film/video: Standard Bathroom Accessibility: Yes   Home Equipment: Agricultural consultant (2 wheels)          Prior Functioning/Environment Prior Level of Function : Independent/Modified Independent;Driving             Mobility Comments: Independent with occasional use of rolling walker ADLs Comments: indep, recently limited by pain. reports using pill box to manage meds, drives self to HD    OT Problem List: Decreased strength;Decreased activity tolerance;Impaired balance (sitting and/or standing);Decreased cognition;Decreased safety awareness;Decreased knowledge of use of DME or AE   OT Treatment/Interventions: Self-care/ADL training;Therapeutic exercise;Energy conservation;DME and/or AE instruction;Therapeutic activities;Patient/family education;Balance training      OT Goals(Current goals can be found in the care plan section)   Acute Rehab OT Goals Patient Stated Goal: go home OT Goal Formulation: With patient Time For Goal Achievement: 12/15/23 Potential to Achieve Goals: Good   OT Frequency:  Min 2X/week    Co-evaluation              AM-PAC OT "6 Clicks" Daily Activity     Outcome Measure Help from another person eating meals?: A Little Help from another person taking care of personal grooming?: A Little Help from another person toileting, which includes using toliet, bedpan, or urinal?: A Little Help from another person bathing (including washing, rinsing, drying)?: A Little Help from another person to put on and taking off regular  upper body clothing?: A Little Help from another person to put on and taking off regular lower body clothing?: A Little 6 Click Score: 18   End of Session Equipment Utilized During Treatment: Rolling walker (2 wheels);Gait belt Nurse  Communication: Mobility status  Activity Tolerance: Patient tolerated treatment well Patient left: in bed;with call bell/phone within reach;with bed alarm set  OT Visit Diagnosis: Unsteadiness on feet (R26.81);Other abnormalities of gait and mobility (R26.89);Muscle weakness (generalized) (M62.81)                Time: 1115-1140 OT Time Calculation (min): 25 min Charges:  OT General Charges $OT Visit: 1 Visit OT Evaluation $OT Eval Moderate Complexity: 1 Mod OT Treatments $Therapeutic Activity: 8-22 mins  Joshua Riley, OTR/L Acute Rehab Services Office: [phone]   Shireen Dory 12/01/2023, 12:04 PM

## 2023-12-02 ENCOUNTER — Inpatient Hospital Stay: Admitting: Infectious Disease

## 2023-12-02 ENCOUNTER — Encounter (HOSPITAL_BASED_OUTPATIENT_CLINIC_OR_DEPARTMENT_OTHER): Payer: Self-pay

## 2023-12-02 DIAGNOSIS — B9620 Unspecified Escherichia coli [E. coli] as the cause of diseases classified elsewhere: Secondary | ICD-10-CM

## 2023-12-02 DIAGNOSIS — K651 Peritoneal abscess: Secondary | ICD-10-CM | POA: Diagnosis not present

## 2023-12-02 DIAGNOSIS — R55 Syncope and collapse: Secondary | ICD-10-CM | POA: Diagnosis not present

## 2023-12-02 DIAGNOSIS — M4628 Osteomyelitis of vertebra, sacral and sacrococcygeal region: Secondary | ICD-10-CM | POA: Diagnosis not present

## 2023-12-02 DIAGNOSIS — I679 Cerebrovascular disease, unspecified: Secondary | ICD-10-CM | POA: Insufficient documentation

## 2023-12-02 DIAGNOSIS — Z1612 Extended spectrum beta lactamase (ESBL) resistance: Secondary | ICD-10-CM | POA: Diagnosis not present

## 2023-12-02 LAB — RENAL FUNCTION PANEL
Albumin: 3 g/dL — ABNORMAL LOW (ref 3.5–5.0)
Anion gap: 9 (ref 5–15)
BUN: 44 mg/dL — ABNORMAL HIGH (ref 8–23)
CO2: 26 mmol/L (ref 22–32)
Calcium: 8.8 mg/dL — ABNORMAL LOW (ref 8.9–10.3)
Chloride: 101 mmol/L (ref 98–111)
Creatinine, Ser: 7.05 mg/dL — ABNORMAL HIGH (ref 0.61–1.24)
GFR, Estimated: 7 mL/min — ABNORMAL LOW (ref >60)
Glucose, Bld: 92 mg/dL (ref 70–99)
Phosphorus: 4.6 mg/dL (ref 2.5–4.6)
Potassium: 4.9 mmol/L (ref 3.5–5.1)
Sodium: 136 mmol/L (ref 135–145)

## 2023-12-02 LAB — CULTURE, BLOOD (ROUTINE X 2)
Culture: NO GROWTH
Culture: NO GROWTH
Special Requests: ADEQUATE

## 2023-12-02 LAB — CBC
HCT: 22.9 % — ABNORMAL LOW (ref 39.0–52.0)
Hemoglobin: 7.7 g/dL — ABNORMAL LOW (ref 13.0–17.0)
MCH: 39.9 pg — ABNORMAL HIGH (ref 26.0–34.0)
MCHC: 33.6 g/dL (ref 30.0–36.0)
MCV: 118.7 fL — ABNORMAL HIGH (ref 80.0–100.0)
Platelets: 193 10*3/uL (ref 150–400)
RBC: 1.93 MIL/uL — ABNORMAL LOW (ref 4.22–5.81)
RDW: 16.3 % — ABNORMAL HIGH (ref 11.5–15.5)
WBC: 6.4 10*3/uL (ref 4.0–10.5)
nRBC: 0 % (ref 0.0–0.2)

## 2023-12-02 NOTE — Consult Note (Addendum)
 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 Coli. Id here for f/u as patient admitted   #Ischial/anal fossa abscess, sacral osteomyelitis Reviewed radiology note 5/24; abscess no longer there Noted drain fell out He has a drain left flank not sure what this is for or if the old drain that was mentioned "fell out"  -finished all abx today -please discuss with ir if the left flank drain infact the abscess drain that was dislodged, then please remove as it is not doing its job -maintain contact isolation -id will sign off -no need for id clinic f/u -discuss with primary team    ----------------- Addendum Spoke with dr Darlyn Eke, and reviewed ct 5/23 He appears to have based on exam location of the left flank a nephrostomy tube The drain that fell out perhaps was the left gransgluteal drain which is not visualized on exam    ------------------------------------------------ Principal Problem:   Syncope Active Problems:   Orthostatic hypotension   Nephrostomy present (HCC)   Atrial fibrillation (HCC)   End-stage 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 male with recurrent sacral abscess with drain s/p ending of abx course, id here for f/u visit as he is admitted  Reviewed chart Patient doing  well Left flank drain in but no output   Reviewed radiology note 5/24 "Drain fell out last night while moving him" No fluid collection so ir didn't replace another drain  He wants to go home He finish abx today for previous abscess     Family History  Problem Relation Age of Onset   Stroke Mother    Cancer Father    Esophageal cancer Brother     Social History   Tobacco Use   Smoking status: Former    Current packs/day: 0.00    Average packs/day: 2.0 packs/day for 6.0 years (12.0 ttl pk-yrs)    Types: Cigarettes    Start date: 62    Quit date: 78    Years since quitting: 40.4    Passive exposure: Never   Smokeless tobacco: Never  Vaping Use   Vaping status: Never Used  Substance Use Topics   Alcohol use: Yes    Alcohol/week: 5.0 standard drinks of alcohol    Types: 5 Shots of liquor per week    Comment: socially   Drug use: Never    Allergies  Allergen Reactions   Baclofen Other (See Comments)    Altered mental status, after accidental overdose     Cephalosporins Rash   Ciprofloxacin Itching and Rash    Review of Systems: ROS All Other ROS was negative, except mentioned above   Past Medical History:  Diagnosis Date   A-fib (HCC)    Anemia    Arthritis    Cancer (HCC)  Basal cell   COVID-19    2021   Dysrhythmia    Afib-controlled on eliquis    ESRD (end stage renal disease) (HCC) 10/22/2021   Glaucoma 11/18/2021   History of DVT (deep vein thrombosis)    Hydronephrosis    managed wtih a PCN   Idiopathic neuropathy 10/22/2021   lyrica     Ileostomy in place [facility])    Obstructive uropathy    With chronic left nephrostomy   Old retinal detachment, total or subtotal    Orthostatic hypotension 10/22/2021   Sleep apnea    does not need a machine   Stroke (HCC)    Ulcerative colitis (HCC)    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 rate 15, height 5\' 8"  (1.727 m), weight 69.1 kg, SpO2 97%.  Physical Exam  General/constitutional: no distress, pleasant HEENT: Normocephalic, PER, Conj Clear, EOMI, Oropharynx clear Neck supple CV: rrr no mrg Lungs: clear to auscultation, normal respiratory effort Abd: Soft, Nontender - right lower abd colostomy present Ext: no edema Skin: No Rash Neuro: nonfocal MSK: no peripheral joint swelling/tenderness/warmth; back spines nontender  Gu: Left flank catheter present no significant output  Lab Results Lab Results  Component Value Date   WBC 6.4 12/02/2023   HGB 7.7 (L) 12/02/2023   HCT 22.9 (L) 12/02/2023   MCV 118.7 (H) 12/02/2023   PLT 193 12/02/2023    Lab Results  Component Value Date   CREATININE 7.05 (H) 12/02/2023   BUN 44 (H) 12/02/2023   NA 136 12/02/2023   K 4.9 12/02/2023   CL 101 12/02/2023   CO2 26 12/02/2023    Lab Results  Component Value Date   ALT 16 11/29/2023   AST 23 11/29/2023   ALKPHOS 56 11/29/2023   BILITOT 1.1 11/29/2023      Microbiology: Recent Results (from the past 240 hours)  Resp panel by RT-PCR (RSV, Flu A&B, Covid) Anterior Nasal Swab     Status: None   Collection Time: 11/27/23  2:04 PM   Specimen: Anterior Nasal Swab  Result Value Ref Range Status   SARS Coronavirus 2 by RT PCR NEGATIVE NEGATIVE Final   Influenza A by PCR NEGATIVE NEGATIVE Final   Influenza B by PCR NEGATIVE NEGATIVE Final    Comment: (NOTE) The Xpert Xpress SARS-CoV-2/FLU/RSV  plus assay is intended as an aid in the diagnosis of influenza from Nasopharyngeal swab specimens and should not be used as a sole basis for treatment. Nasal washings and aspirates are unacceptable for Xpert Xpress SARS-CoV-2/FLU/RSV testing.  Fact Sheet for Patients: BloggerCourse.com  Fact Sheet for Healthcare Providers: SeriousBroker.it  This test is not yet approved or cleared by the [place]  FDA and has been authorized for detection and/or diagnosis of SARS-CoV-2 by FDA under an Emergency Use Authorization (EUA). This EUA will remain in effect (meaning this test  can be used) for the duration of the COVID-19 declaration under Section 564(b)(1) of the Act, 21 U.S.C. section 360bbb-3(b)(1), unless the authorization is terminated or revoked.     Resp Syncytial Virus by PCR NEGATIVE NEGATIVE Final    Comment: (NOTE) Fact Sheet for Patients: BloggerCourse.com  Fact Sheet for Healthcare Providers: SeriousBroker.it  This test is not yet approved or cleared by the [place]  FDA and has been authorized for detection and/or diagnosis of SARS-CoV-2 by FDA under an Emergency Use Authorization (EUA). This EUA will remain in effect (meaning this test can be used) for the duration of the COVID-19 declaration under Section 564(b)(1) of the Act, 21 U.S.C. section 360bbb-3(b)(1), unless the authorization is terminated or revoked.  Performed at [facility], [address], [place], [place] [address]   Blood Culture (routine x 2)     Status: None   Collection Time: 11/27/23  2:12 PM   Specimen: BLOOD RIGHT FOREARM  Result Value Ref Range Status   Specimen Description BLOOD RIGHT FOREARM  Final   Special Requests   Final    BOTTLES DRAWN AEROBIC AND ANAEROBIC Blood Culture results may not be optimal due to an inadequate volume of blood received in culture bottles   Culture    Final    NO GROWTH 5 DAYS Performed at [facility], [address], [place], [place] [address]    Report Status 12/02/2023 FINAL  Final  Blood Culture (routine x 2)     Status: None   Collection Time: 11/27/23  2:30 PM   Specimen: BLOOD  Result Value Ref Range Status   Specimen Description BLOOD RIGHT ANTECUBITAL  Final   Special Requests   Final    BOTTLES DRAWN AEROBIC AND ANAEROBIC Blood Culture adequate volume   Culture   Final    NO GROWTH 5 DAYS Performed at [facility], [address] [place], [place] [address]    Report Status 12/02/2023 FINAL  Final     Serology:    Imaging: If present, new imagings (plain films, ct scans, and mri) have been personally visualized and interpreted; radiology reports have been reviewed. Decision making incorporated into the Impression / Recommendations.  5/25 mri brain Aging brain without acute or reversible finding. No focal correlate for seizure history.   Pervasive motion artifact with mild exam truncation  Jamesetta Mcbride, MD [facility] [phone] pager    12/02/2023, 1:44 PM

## 2023-12-02 NOTE — Plan of Care (Signed)

## 2023-12-02 NOTE — Progress Notes (Signed)
 Physical Therapy Treatment Patient Details Name: Joshua Riley MRN: [id number] DOB: 03-09-1936 Today's Date: 12/02/2023   History of Present Illness 88 yo male admitted 11/27/23 for syncope and hypotension. EEG negative for Sz. PMHx: - ESRD on HD, Afib, CVA, depression, colectomy, ileostomy, chronic hypotension, DVT, acute pericarditis, lt groin abscess with drain, nephrostomy tubes, CHF, COPD, glaucoma    PT Comments  Pt up in chair on arrival and agreeable to session with continued progress towards acute goals. Pt continues to demonstrate decreased insight into deficits, leaving RW behind when approaching chair at end fo session and requiring cues for safety with transfers. Pt demonstrating increased activity tolerance, progressing gait for hallway distance with RW for support and grossly supervision for safety. Patient will benefit from continued inpatient follow up therapy, <3 hours/day to address deficits and maximize functional independence and safety with mobility. Pt continues to benefit from skilled PT services to progress toward functional mobility goals.     If plan is discharge home, recommend the following: Supervision due to cognitive status;Assist for transportation;Assistance with cooking/housework   Can travel by private vehicle     Yes  Equipment Recommendations  None recommended by PT    Recommendations for Other Services       Precautions / Restrictions Precautions Precautions: Fall;Other (comment) Recall of Precautions/Restrictions: Impaired Precaution/Restrictions Comments: ileostomy, lt gluteal drain, nephrostomy tube, monitor orthostatics Restrictions Weight Bearing Restrictions Per Provider Order: No     Mobility  Bed Mobility Overal bed mobility: Modified Independent             General bed mobility comments: up in chair    Transfers Overall transfer level: Needs assistance Equipment used: Rolling walker (2 wheels) Transfers: Sit to/from  Stand Sit to Stand: Contact guard assist           General transfer comment: CGA for safety with cues for hand placement    Ambulation/Gait Ambulation/Gait assistance: Contact guard assist Gait Distance (Feet): 250 Feet Assistive device: Rolling walker (2 wheels) Gait Pattern/deviations: Step-through pattern, Decreased stride length       General Gait Details: cues for proximity to RW, safety and pt able to self-regulate distance for endurance. no dizziness with gait   Stairs             Wheelchair Mobility     Tilt Bed    Modified Rankin (Stroke Patients Only)       Balance Overall balance assessment: Needs assistance Sitting-balance support: No upper extremity supported, Feet supported Sitting balance-Leahy Scale: Good     Standing balance support: Bilateral upper extremity supported, During functional activity Standing balance-Leahy Scale: Fair Standing balance comment: pt able to perform static standing EOB and chair, RW for gait                            Communication Communication Communication: Impaired Factors Affecting Communication: Hearing impaired  Cognition Arousal: Alert Behavior During Therapy: WFL for tasks assessed/performed   PT - Cognitive impairments: Awareness, Safety/Judgement, Memory                       PT - Cognition Comments: pt HOH and joking making it difficult to determine at times confusion vs HOH vs joking. Following commands: Impaired Following commands impaired: Follows one step commands with increased time    Cueing Cueing Techniques: Verbal cues  Exercises      General Comments General comments (skin integrity, edema, etc.): BP  soft but stable. 90/58 (69) pregait in sitting, 92/59 (70) after ~31mins of gait pt with dizziness      Pertinent Vitals/Pain Pain Assessment Pain Assessment: No/denies pain    Home Living                          Prior Function            PT  Goals (current goals can now be found in the care plan section) Acute Rehab PT Goals Patient Stated Goal: go home PT Goal Formulation: With patient Time For Goal Achievement: 12/13/23 Progress towards PT goals: Progressing toward goals    Frequency    Min 2X/week      PT Plan      Co-evaluation              AM-PAC PT "6 Clicks" Mobility   Outcome Measure  Help needed turning from your back to your side while in a flat bed without using bedrails?: None Help needed moving from lying on your back to sitting on the side of a flat bed without using bedrails?: None Help needed moving to and from a bed to a chair (including a wheelchair)?: A Little Help needed standing up from a chair using your arms (e.g., wheelchair or bedside chair)?: A Little Help needed to walk in hospital room?: A Little Help needed climbing 3-5 steps with a railing? : A Little 6 Click Score: 20    End of Session   Activity Tolerance: Patient tolerated treatment well Patient left: in chair;with call bell/phone within reach;with chair alarm set Nurse Communication: Mobility status (BP readings) PT Visit Diagnosis: Unsteadiness on feet (R26.81);Other abnormalities of gait and mobility (R26.89);History of falling (Z91.81);Muscle weakness (generalized) (M62.81)     Time: 1440-1456 PT Time Calculation (min) (ACUTE ONLY): 16 min  Charges:    $Gait Training: 8-22 mins PT General Charges $$ ACUTE PT VISIT: 1 Visit                     Aaliyah Cancro R. PTA Acute Rehabilitation Services Office: [phone]   Agapito Horseman 12/02/2023, 3:42 PM

## 2023-12-02 NOTE — Assessment & Plan Note (Signed)
 Ostomy in place Disease quiescent off disease modifying therapy.

## 2023-12-02 NOTE — Assessment & Plan Note (Addendum)
 Chronic.  Rate controlled - Continue ELiquis 

## 2023-12-02 NOTE — Assessment & Plan Note (Addendum)
 Sacral fluid collection - Consult ID - Follow up with IR in 1-2 weeks for repeat CT

## 2023-12-02 NOTE — NC FL2 (Signed)
 [place]  MEDICAID FL2 LEVEL OF CARE FORM     IDENTIFICATION  Patient Name: Joshua Riley Birthdate: 04/13/1936 Sex: male Admission Date (Current Location): 11/27/2023  [facility] and [place] Number:  Producer, television/film/video and Address:  [facility], [address] [place], [place] [address]      Provider Number: [id number]  Attending Physician Name and Address:  Ephriam Hashimoto, *  Relative Name and Phone Number:  Mollie Anger (daughter) [phone]    Current Level of Care: Hospital Recommended Level of Care: Skilled Nursing Facility Prior Approval Number:    Date Approved/Denied:   PASRR Number: PASRR under review  Discharge Plan: SNF    Current Diagnoses: Patient Active Problem List   Diagnosis Date Noted   Cerebrovascular disease 12/02/2023   Syncope 11/28/2023   Hypotension 11/28/2023   Seizure like activity 11/28/2023   Acute pericarditis 11/11/2023   Sacral osteomyelitis (HCC) 11/11/2023   Chest pain 11/09/2023   Abscess of abdominal cavity (HCC) 07/17/2023   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   Anastomotic leak of intestine 07/14/2023   Paroxysmal atrial fibrillation (HCC) 07/08/2023   History of COPD 07/08/2023   History of anemia due to chronic kidney disease 07/08/2023   Abscess of groin, left 07/07/2023   Macrocytic anemia 04/08/2023   Depression 04/08/2023   Abnormal bladder CT 03/23/2023   Infection due to ESBL-producing Escherichia coli 03/23/2023   DOE (dyspnea on exertion) 07/22/2022   Coag negative Staphylococcus bacteremia 06/06/2022   History of sepsis 06/02/2022   Ulcerative colitis (HCC) 06/02/2022   End-stage renal disease on hemodialysis (HCC) 05/06/2022   SBO (small bowel obstruction) (HCC) 11/18/2021   Glaucoma 11/18/2021   Atrial fibrillation (HCC) 11/18/2021   Orthostatic hypotension 10/22/2021   Idiopathic neuropathy 10/22/2021   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   Nephrostomy present (HCC) 10/22/2021     Orientation RESPIRATION BLADDER Height & Weight     Self, Time, Situation, Place  Normal Continent Weight: 152 lb 5.4 oz (69.1 kg) Height:  5\' 8"  (172.7 cm)  BEHAVIORAL SYMPTOMS/MOOD NEUROLOGICAL BOWEL NUTRITION STATUS      Ileostomy Diet (Please see discharge summary)  AMBULATORY STATUS COMMUNICATION OF NEEDS Skin   Supervision Verbally Other (Comment) (Abrasion,abdomen,R,Lower,Ecchymosis,Abdomen,R,Lower,Erythema,Abdomen,R,Wound/Incision LDAs,WOund/Incision open or dehisced skin tear,toe,anterior,L,2nd,3rd,please see additional info)                       Personal Care Assistance Level of Assistance  Bathing, Feeding, Dressing Bathing Assistance: Limited assistance Feeding assistance: Limited assistance Dressing Assistance: Limited assistance     Functional Limitations Info  Hearing, Speech   Hearing Info: Impaired Speech Info: Adequate    SPECIAL CARE FACTORS FREQUENCY  PT (By licensed PT), OT (By licensed OT)     PT Frequency: 5x min weekly OT Frequency: 5x min weekly            Contractures Contractures Info: Not present    Additional Factors Info  Code Status, Allergies, Isolation Precautions Code Status Info: DNR Allergies Info: Baclofen,Cephalosporins,Ciprofloxacin     Isolation Precautions Info: ESBL     Current Medications (12/02/2023):  This is the current hospital active medication list Current Facility-Administered Medications  Medication Dose Route Frequency Provider Last Rate Last Admin   acetaminophen  (TYLENOL ) tablet 650 mg  650 mg Oral Q6H PRN Juliette Oh, MD       Or   acetaminophen  (TYLENOL ) suppository 650 mg  650 mg Rectal Q6H PRN Juliette Oh, MD  apixaban  (ELIQUIS ) tablet 2.5 mg  2.5 mg Oral BID Verlyn Goad, MD   2.5 mg at 12/02/23 1610   atorvastatin  (LIPITOR) tablet 20 mg  20 mg Oral Daily Verlyn Goad, MD   20 mg at 12/02/23 9604   atropine 1 MG/10ML injection 0.5 mg  0.5 mg Intravenous Once PRN Mansy,  Jan A, MD       calcitRIOL  (ROCALTROL ) capsule 0.5 mcg  0.5 mcg Oral Q M,W,F-HD Anderson, Courtney E, NP   0.5 mcg at 11/30/23 1250   [START ON 12/07/2023] Darbepoetin Alfa  (ARANESP ) injection 150 mcg  150 mcg Subcutaneous Q Mon-1800 Collins, Samantha G, PA-C       fludrocortisone (FLORINEF) tablet 0.1 mg  0.1 mg Oral Daily Verlyn Goad, MD   0.1 mg at 12/02/23 5409   LORazepam (ATIVAN) injection 2 mg  2 mg Intravenous Q6H PRN Sundil, Subrina, MD       meropenem  (MERREM ) 1 g in sodium chloride  0.9 % 100 mL IVPB  1 g Intravenous Daily Verlyn Goad, MD 200 mL/hr at 12/02/23 1232 1 g at 12/02/23 1232   midodrine  (PROAMATINE ) tablet 20 mg  20 mg Oral TID WC Sundil, Subrina, MD   20 mg at 12/02/23 1226   timolol  (TIMOPTIC ) 0.5 % ophthalmic solution 1 drop  1 drop Both Eyes BID Verlyn Goad, MD   1 drop at 12/02/23 8119   vancomycin  (VANCOREADY) IVPB 750 mg/150 mL  750 mg Intravenous Q M,W,F-HD Verlyn Goad, MD   Stopped at 11/30/23 1126     Discharge Medications: Please see discharge summary for a list of discharge medications.  Relevant Imaging Results:  Relevant Lab Results:   Additional Information [id number], out-pt HD at [facility] on MWF 6:10 am chair time.Wound/Incision open or dehisced heel,L,upper callous removed outpatient MD office,Wound/Incision open or dehisced skin tear elbow,post,R,Wound/Incision open or dehisced vertebral column,upper,medial,Wound/Incision open or dehisced abdomen,lower,R  Carmon Christen, LCSWA

## 2023-12-02 NOTE — Assessment & Plan Note (Signed)
 Outpatient follow up.

## 2023-12-02 NOTE — Assessment & Plan Note (Addendum)
 Due to intradialytic hypotension and chronic orthostasis.  There is clearly uncertainty about potential side effects (bradycardia, supine hypertension/stroke, QT changes) of this high dose of midodrine .  However, it seems manifest that the alternative is immediate cessation of HD.  Family and patient have been advised of uncertainties surrounding midodrine  dosing, lack of alternative options, and wish to continue at this time. - Continue midodrine  and Florinef 

## 2023-12-02 NOTE — Assessment & Plan Note (Signed)
 Hgb stable - Continue Aranesp  - Okay to continue Eliquis 

## 2023-12-02 NOTE — Assessment & Plan Note (Signed)
 Chronic.  Rate controlled - Continue ELiquis 

## 2023-12-02 NOTE — TOC Progression Note (Addendum)
 Transition of Care [facility]) - Progression Note    Patient Details  Name: Joshua Riley MRN: [id number] Date of Birth: Oct 12, 1935  [facility] CM/SW Contact  Carmon Christen, LCSWA Phone Number: 12/02/2023, 12:54 PM  Clinical Narrative:     Patients passr currently pending. CSW submitted requested clinicals to [place] must for review. CSW will continue to follow. CSW to meet with patients daughter at 2pm to provide patients SNF bed offers. CSW will continue to follow.  Update- CSW provided patients daughter Mollie Anger with patients SNF bed offers at bedside. Mollie Anger accepted SNF bed offer with [facility] as first choice and [facility] as second choice for patient. CSW awaiting to hear back from [place] with [facility] on if she can offer SNF bed for patient. Patients passr currently pending.   Expected Discharge Plan: Skilled Nursing Facility Barriers to Discharge: Continued Medical Work up  Expected Discharge Plan and Services In-house Referral: Clinical Social Work Discharge Planning Services: CM Consult Post Acute Care Choice: Skilled Nursing Facility Living arrangements for the past 2 months: Single Family Home                   DME Agency: NA                   Social Determinants of Health (SDOH) Interventions SDOH Screenings   Food Insecurity: No Food Insecurity (11/28/2023)  Housing: Low Risk  (11/28/2023)  Transportation Needs: No Transportation Needs (11/28/2023)  Utilities: Not At Risk (11/28/2023)  Alcohol Screen: Low Risk  (10/26/2023)  Depression (PHQ2-9): Low Risk  (11/17/2023)  Financial Resource Strain: Low Risk  (10/26/2023)  Physical Activity: Insufficiently Active (10/26/2023)  Social Connections: Socially Isolated (11/28/2023)  Stress: Stress Concern Present (10/26/2023)  Tobacco Use: Medium Risk (11/28/2023)    Readmission Risk Interventions    11/10/2023    3:46 PM 07/21/2023   12:26 PM  Readmission Risk Prevention Plan  Transportation  Screening Complete Complete  PCP or Specialist Appt within 5-7 Days Complete   Home Care Screening Complete   Medication Review (RN CM) Complete   HRI or Home Care Consult  Complete  Social Work Consult for Recovery Care Planning/Counseling  Complete  Palliative Care Screening  Not Applicable  Medication Review Oceanographer)  Complete

## 2023-12-02 NOTE — Progress Notes (Signed)
 [facility] Liaison Note  12/02/2023  Joshua Riley [birth year] [id number]  Location: RN Hospital Liaison screened the patient remotely at [facility].  Insurance: Medicare   Joshua Riley is a 88 y.o. male who is a Primary Care Patient of Hunter, Saverio Curling, MD [facility] [place]. The patient was screened for 30 day readmission hospitalization with noted extreme risk score for unplanned readmission risk with 4 IP in 6 months.  The patient was assessed for potential Care Management service needs for post hospital transition for care coordination. Review of patient's electronic medical record reveals patient was Hypotension unspecific. Currently pending SNF bed offer for STR. If pt is discharged at an unaffiliated facility the facility will continue to address his needs. If pt is discharged to an affiliated facility liaison will collaborate with [facility]BCI PAC-RN to follow up accordingly for post SNF community care management services,  Plan: [facility] Liaison will continue to follow progress and disposition to asess for post hospital community care coordination/management needs.  Referral request for community care coordination: pending disposition.   [facility]I Care Management/Population Health does not replace or interfere with any arrangements made by the Inpatient Transition of Care team.   For questions contact:   Lilla Reichert, RN, BSN Hospital Liaison [place]   [facility], Population Health Office Hours MTWF  8:00 am-6:00 pm Direct Dial: [phone] mobile [email or web address]

## 2023-12-02 NOTE — Progress Notes (Signed)
 Mobility Specialist Progress Note;   12/02/23 0934  Mobility  Activity Ambulated with assistance in hallway  Level of Assistance Contact guard assist, steadying assist  Assistive Device Front wheel walker  Distance Ambulated (ft) 250 ft  Activity Response Tolerated well  Mobility Referral Yes  Mobility visit 1 Mobility  Mobility Specialist Start Time (ACUTE ONLY) 0934  Mobility Specialist Stop Time (ACUTE ONLY) 0958  Mobility Specialist Time Calculation (min) (ACUTE ONLY) 24 min    Post-mobility: BP 110/43 (64)  Pt agreeable to mobility. Required light MinG assistance during ambulation for safety. VSS on RA and no c/o when asked. Pt returned back to chair with all needs met, alarm on. BP taken after mobility (see above). RN notified.   Janit Meline Mobility Specialist Please contact via SecureChat or [employer] [phone]

## 2023-12-02 NOTE — Progress Notes (Signed)
 [facility] Progress Note   Subjective:   Reports he plans to go to SNF for rehab because his daughter is worried about his safety. Feels well today, denies SOB, dizziness, CP, abdominal pain, nausea.   Objective Vitals:   12/01/23 1733 12/01/23 1931 12/01/23 2313 12/02/23 0852  BP: (!) 96/58 (!) 90/58 (!) 95/58 (!) 92/55  Pulse: 66 66 66 85  Resp: 17 14 15    Temp: 98.2 F (36.8 C) 98.2 F (36.8 C) 97.6 F (36.4 C) (!) 96.6 F (35.9 C)  TempSrc: Oral Axillary Oral Axillary  SpO2: 97% 97% 97%   Weight:      Height:       Physical Exam  General: Alert male in NAD Heart: RRR, no murmurs, rubs or gallops Lungs: Soft, non-distended, +BS Abdomen: Soft, non-distended, +BS. + ostomy Extremities: No edema b/l lower rextremities Dialysis Access: thigh AVG   Additional Objective Labs: Basic Metabolic Panel: Recent Labs  Lab 11/30/23 0500 12/01/23 0500 12/02/23 0428  NA 139 137 136  K 3.7 4.1 4.9  CL 99 97* 101  CO2 24 28 26   GLUCOSE 128* 113* 92  BUN 39* 27* 44*  CREATININE 8.06* 5.24* 7.05*  CALCIUM  8.6* 8.8* 8.8*  PHOS 5.6* 4.0 4.6   Liver Function Tests: Recent Labs  Lab 11/27/23 1412 11/29/23 0230 11/30/23 0500 12/01/23 0500 12/02/23 0428  AST 25 23  --   --   --   ALT 15 16  --   --   --   ALKPHOS 69 56  --   --   --   BILITOT 0.8 1.1  --   --   --   PROT 6.6 5.8*  --   --   --   ALBUMIN  3.3* 3.1* 3.0* 3.0* 3.0*   No results for input(s): "LIPASE", "AMYLASE" in the last 168 hours. CBC: Recent Labs  Lab 11/27/23 1412 11/28/23 0420 11/29/23 0230 11/30/23 0500 12/01/23 0500 12/02/23 0428  WBC 7.9 7.1 8.1 8.2 6.7 6.4  NEUTROABS 6.9  --   --  6.5  --   --   HGB 8.0* 8.0* 7.5* 7.7* 7.4* 7.7*  HCT 25.2* 26.1* 23.4* 23.7* 22.5* 22.9*  MCV 121.2* 126.1* 121.2* 120.3* 120.3* 118.7*  PLT 171 150 171 177 167 193   Blood Culture    Component Value Date/Time   SDES BLOOD RIGHT ANTECUBITAL 11/27/2023 1430   SPECREQUEST  11/27/2023 1430     BOTTLES DRAWN AEROBIC AND ANAEROBIC Blood Culture adequate volume   CULT  11/27/2023 1430    NO GROWTH 5 DAYS Performed at [facility], [address], [place], [place] [address]    REPTSTATUS 12/02/2023 FINAL 11/27/2023 1430    Cardiac Enzymes: No results for input(s): "CKTOTAL", "CKMB", "CKMBINDEX", "TROPONINI" in the last 168 hours. CBG: Recent Labs  Lab 11/28/23 0223 11/28/23 0635 11/29/23 0120  GLUCAP 104* 92 85   Iron Studies:  Recent Labs    11/30/23 0500  IRON 71  TIBC 259  FERRITIN 1,357*   @lablastinr3 @ Studies/Results: No results found. Medications:  meropenem  (MERREM ) IV 1 g (12/01/23 1054)   vancomycin  Stopped (11/30/23 1126)    apixaban   2.5 mg Oral BID   atorvastatin   20 mg Oral Daily   calcitRIOL   0.5 mcg Oral Q M,W,F-HD   [START ON 12/07/2023] darbepoetin (ARANESP ) injection - DIALYSIS  150 mcg Subcutaneous Q Mon-1800   fludrocortisone  0.1 mg Oral Daily   midodrine   20 mg Oral TID WC  timolol   1 drop Both Eyes BID    Dialysis Orders:  MWF - [facility] 2hrs27min, BFR 350, DFR Auto 1.5,  EDW 70kg, 2K/ 2.5Ca Heparin  None ordered Mircera 150 mcg q2wks - last 5/12 Calcitriol  0.5mcg PO qHD - last 5/23 Home meds: Midodrine  15mg  TID Renvela  0.8GM powder 2 packets TID  Assessment/Plan: Syncopal episode - appears patient also had seizure-like acitivity at admit, neurology evaluated. MRI brain showed no acute process and ECHO showed normal LVEF.  Chronic hypotension - still hypotensive despite Midodrine  15mg  TID. Raised Midodrine  to 20mg  TID, still no improvement and required several IVF boluses on 5/26. Florinef started, BP improved today ESRD -  on HD MWF.  Last CXR no acute process. No UF with HD today Volume  - Does not appear overloaded. See above Anemia of CKD - Hgb 7.7, tsat borderline but ferritin is high. Continue weekly aranesp - increased dose Secondary Hyperparathyroidism -  Corr Ca controlled. Phos at goal.Continue   Presacral fluid collection - S/p drain placement 5/7 in IR. On IV ABXs. Nutrition - Renal diet with fluid restriction  Ramona Burner, PA-C 12/02/2023, 10:38 AM  [facility] Pager: [phone]

## 2023-12-02 NOTE — Hospital Course (Signed)
 88 y.o. 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

## 2023-12-02 NOTE — Assessment & Plan Note (Signed)
-   Continue midodrine  and florinef

## 2023-12-02 NOTE — Progress Notes (Addendum)
  Progress Note   Patient: Joshua Riley ONG:[id number] DOB: 24-Feb-1936 DOA: 11/27/2023     4 DOS: the patient was seen and examined on 12/02/2023 at 10:56AM      Brief hospital course: 88 y.o. 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     Assessment and Plan: * Syncope Due to intradialytic hypotension and chronic orthostasis.  There is clearly uncertainty about potential side effects (bradycardia, supine hypertension/stroke, QT changes) of this high dose of midodrine .  However, it seems manifest that the alternative is immediate cessation of HD.  Family and patient have been advised of uncertainties surrounding midodrine  dosing, lack of alternative options, and wish to continue at this time. - Continue midodrine  and Florinef  Acute pericarditis Recently resolved, no longer on therapy.  End-stage renal disease on hemodialysis [facility]) - Consult Nephrology - Continue Aranesp , Rocaltrol   Paroxysmal atrial fibrillation (HCC) Chronic.  Rate controlled - Continue ELiquis   Ulcerative colitis (HCC) Ostomy in place Disease quiescent off disease modifying therapy.  Cerebrovascular disease - Continue Lipitor  Seizure like activity Seizure ruled out  Sacral osteomyelitis (HCC) Sacral fluid collection There is some ambiguity because the 5/24 IR note implies the drain was dislodged, although the drain is still physically present in his left glute, it is still putting out small amount of serous drainage, so I don't understand why this note was placed. - Consult ID - Follow up with IR in 1-2 weeks for repeat CT  Macrocytic anemia Hgb stable - Continue Aranesp  - Okay to continue Eliquis   Abnormal bladder CT - Outpatient follow  up  Nephrostomy present (HCC)    Orthostatic hypotension - Continue midodrine  and florinef          Subjective: Feeling better.  BPs better today.  No focal pain complaints, no dizzinesss, no chest symptoms.     Physical Exam: BP (!) 85/54   Pulse 69   Temp 98.2 F (36.8 C)   Resp 14   Ht 5\' 8"  (1.727 m)   Wt 72.5 kg   SpO2 100%   BMI 24.30 kg/m   Thin elderly adult male, sitting up in chair, interactive and appropraite RRR no mrumurs, no LE edema Respiratory raate normal, lungs clear Abdomen benign, ostomy in place Attention normal, gait shufflling and affected by kyphosis, attention normal, speehc normal  Data Reviewed: Discussed with ID and Nephrology BMP unremarkable CBC shows stable anemia  Family Communication: Daughter     Disposition: Status is: Inpatient         Author: Ephriam Hashimoto, MD 12/02/2023 4:49 PM  For on call review [email or web address].

## 2023-12-02 NOTE — Assessment & Plan Note (Signed)
-   Consult Nephrology - Continue Aranesp , Rocaltrol

## 2023-12-02 NOTE — Assessment & Plan Note (Signed)
 Seizure ruled out

## 2023-12-02 NOTE — Progress Notes (Signed)
   12/02/23 1845  Vitals  Temp 98.1 F (36.7 C)  Pulse Rate 86  Resp 18  BP (!) 88/52  SpO2 95 %  O2 Device Room Air  Weight 72.6 kg  Type of Weight Post-Dialysis  Oxygen Therapy  Patient Activity (if Appropriate) In bed  Pulse Oximetry Type Continuous  Oximetry Probe Site Changed No  Post Treatment  Dialyzer Clearance Lightly streaked  Hemodialysis Intake (mL) 0 mL  Liters Processed 66  Fluid Removed (mL) 0 mL  Tolerated HD Treatment Yes  AVG/AVF Arterial Site Held (minutes) 10 minutes  AVG/AVF Venous Site Held (minutes) 10 minutes   Received patient in bed to unit.  Alert and oriented.  Informed consent signed and in chart.   TX duration:2.75hrs  Patient tolerated well.  Transported back to the room  Alert, without acute distress.  Hand-off given to patient's nurse.   Access used: L thigh AVG Access issues: none  Total UF removed: 0 Medication(s) given: none    [facility]

## 2023-12-02 NOTE — Assessment & Plan Note (Signed)
 Recently resolved, no longer on therapy.

## 2023-12-02 NOTE — Assessment & Plan Note (Signed)
 -  Continue Lipitor

## 2023-12-02 NOTE — Progress Notes (Addendum)
 RE: Joshua Riley  Date of Birth: [birth year]  Date: 12/02/2023    To Whom It May Concern:   Please be advised that the above-named patient will require a short-term nursing home stay - anticipated 30 days or less for rehabilitation and strengthening. The plan is for return home.

## 2023-12-03 ENCOUNTER — Ambulatory Visit: Admitting: Family Medicine

## 2023-12-03 DIAGNOSIS — R55 Syncope and collapse: Secondary | ICD-10-CM | POA: Diagnosis not present

## 2023-12-03 MED ORDER — MIDODRINE HCL 10 MG PO TABS: 20.0000 mg | ORAL_TABLET | Freq: Three times a day (TID) | ORAL | Status: DC

## 2023-12-03 MED ORDER — CALCITRIOL 0.5 MCG PO CAPS: 0.5000 ug | ORAL_CAPSULE | ORAL | Status: DC

## 2023-12-03 MED ORDER — TRAMADOL HCL 50 MG PO TABS: 50.0000 mg | ORAL_TABLET | Freq: Three times a day (TID) | ORAL | 0 refills | Status: DC | PRN

## 2023-12-03 MED ORDER — FLUDROCORTISONE ACETATE 0.1 MG PO TABS: 0.1000 mg | ORAL_TABLET | Freq: Every day | ORAL | Status: DC

## 2023-12-03 NOTE — Progress Notes (Signed)
 Mobility Specialist Progress Note;   12/03/23 1029  Mobility  Activity Ambulated with assistance in hallway  Level of Assistance Standby assist, set-up cues, supervision of patient - no hands on  Assistive Device Front wheel walker  Distance Ambulated (ft) 250 ft  Activity Response Tolerated well  Mobility Referral Yes  Mobility visit 1 Mobility  Mobility Specialist Start Time (ACUTE ONLY) 1029  Mobility Specialist Stop Time (ACUTE ONLY) 1039  Mobility Specialist Time Calculation (min) (ACUTE ONLY) 10 min   Pt agreeable to mobility. Required no physical assistance during ambulation, SV. VC required throughout ambulation for safety as pt can be impulsive w/ RW. HR up to 122 bpm w/ activity. Pt returned back to chair with all needs met, alarm on.   Janit Meline Mobility Specialist Please contact via SecureChat or [employer] [phone]

## 2023-12-03 NOTE — Progress Notes (Signed)
 Printed AVS and gave to nurse secretary at front desk to place with chart. Patient planned to discharge to a SNF via [facility].

## 2023-12-03 NOTE — Discharge Summary (Signed)
 Physician Discharge Summary   Patient: Joshua Riley MRN: [id number] DOB: 1936-02-15  Admit date:     11/27/2023  Discharge date: 12/03/23  Discharge Physician: Ephriam Hashimoto   PCP: Almira Jaeger, MD     Recommendations at discharge:  Follow up with Interventional Radiology in 1-2 weeks, call for appointment Follow up with PCP Dr. Arlene Ben 1 week after SNF discharge Recommend palliative care to follow at SNF     Discharge Diagnoses: Principal Problem:   Syncope Active Problems:   Orthostatic hypotension   End-stage renal disease on hemodialysis (HCC)   Paroxysmal atrial fibrillation (HCC)   Ulcerative colitis (HCC)   Nephrostomy present (HCC)   Abnormal bladder CT   Macrocytic anemia   Sacral osteomyelitis (HCC)   Hypotension   Seizure like activity   Cerebrovascular disease      Hospital Course: 88 y.o. 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      * Syncope The patient has a long history of chronic orthostasis and intradialytic hypotension.  In the setting of his 10 lb weight loss in the last year, his chronic infection, and his overall poor function, these issues are worsening.  There is clearly uncertainty about potential side effects of this high dose of midodrine  (bradycardia, supine hypertension/stroke, QT changes).  The underlying contributors (infection, age, weight loss) have no cure, and the consequences of hypotension after dialysis are that he would have to cease dialysis.  This was discussed with the patient and daughter, both of whom do not wish cessation of HD at this time.  Family and patient have been advised of uncertainties surrounding midodrine  dosing, lack of alternative options, and wish to  continue at this time. - Continue midodrine  and Florinef   Seizure like activity Admitted and neurology were consulted.  MRI brain and EEG were unremarkable.  Neurology concluded that the observed movements were typical convulsive syncope, not epileptic.  No further work up.   Recent acute pericarditis Recently resolved, no longer on therapy.  End-stage renal disease on hemodialysis (HCC) MWF HD  Paroxysmal atrial fibrillation (HCC) Chronic.  Rate controlled.  Stable on Eliquis    Ulcerative colitis (HCC) Ostomy in place Disease quiescent off disease modifying therapy.  Cerebrovascular disease - Continue Lipitor  Sacral osteomyelitis (HCC) Sacral fluid collection Recently completed prolonged course of IV ertapenem  and Vancomycin .  On most recent imaging, fluid space had collapsed.    ID recommended no particular follow up, although I would recommend this be reconsidered pending imaging results in 1-2 weeks  He is supposed to have IR follow up in 1-2 weeks for repeat CT  Macrocytic anemia Hgb stable   Abnormal bladder CT Follow up with PCP  Nephrostomy present (HCC) Longstanding.  No change to plan.  Orthostatic hypotension - Continue midodrine  and florinef            The [place]  Controlled Substances Registry was reviewed for this patient prior to discharge.   Consultants: Neurology Infectious disease Palliative Care Nephrology    Disposition: Skilled nursing facility Diet recommendation:  Renal diet  DISCHARGE MEDICATION: Allergies as of 12/03/2023       Reactions   Baclofen Other (See Comments)   Altered mental status, after accidental overdose   Cephalosporins Rash   Ciprofloxacin Itching, Rash  Medication List     STOP taking these medications    ertapenem  1 g in sodium chloride  0.9 % 100 mL   ertapenem  IVPB Commonly known as: INVANZ    nitroGLYCERIN  0.4 MG SL tablet Commonly known as: NITROSTAT    vancomycin   IVPB       TAKE these medications    albuterol  108 (90 Base) MCG/ACT inhaler Commonly known as: VENTOLIN  HFA Inhale 1-2 puffs into the lungs every 6 (six) hours as needed for wheezing or shortness of breath.   albuterol  (2.5 MG/3ML) 0.083% nebulizer solution Commonly known as: PROVENTIL  Take 2.5 mg by nebulization every 6 (six) hours as needed for wheezing or shortness of breath.   atorvastatin  20 MG tablet Commonly known as: LIPITOR Take 1 tablet (20 mg total) by mouth daily.   budesonide  0.5 MG/2ML nebulizer solution Commonly known as: PULMICORT  Take 0.5 mg by nebulization 2 (two) times daily as needed (for respiratory flares).   calcitRIOL  0.5 MCG capsule Commonly known as: ROCALTROL  Take 1 capsule (0.5 mcg total) by mouth every Monday, Wednesday, and Friday with hemodialysis. Start taking on: Dec 04, 2023   dorzolamide  2 % ophthalmic solution Commonly known as: TRUSOPT  Place 1 drop into the right eye at bedtime.   Eliquis  2.5 MG Tabs tablet Generic drug: apixaban  Take 1 tablet (2.5 mg total) by mouth 2 (two) times daily.   fludrocortisone 0.1 MG tablet Commonly known as: FLORINEF Take 1 tablet (0.1 mg total) by mouth daily. Start taking on: Dec 04, 2023   folic acid  1 MG tablet Commonly known as: FOLVITE  Take 1 tablet (1 mg total) by mouth daily.   gabapentin  300 MG capsule Commonly known as: NEURONTIN  Take 300 mg by mouth at bedtime.   loperamide  2 MG capsule Commonly known as: IMODIUM  Take 1 capsule (2 mg total) by mouth as needed for diarrhea or loose stools.   midodrine  10 MG tablet Commonly known as: PROAMATINE  Take 2 tablets (20 mg total) by mouth 3 (three) times daily with meals. What changed:  medication strength how much to take when to take this   mometasone -formoterol  200-5 MCG/ACT Aero Commonly known as: DULERA  Inhale 2 puffs into the lungs 2 (two) times daily.   multivitamin Tabs tablet Take 1 tablet by mouth daily.   Omega-3  Gummies Chew Chew 1 tablet by mouth in the morning and at bedtime.   pantoprazole  40 MG tablet Commonly known as: PROTONIX  Take 1 tablet (40 mg total) by mouth 2 (two) times daily. What changed:  when to take this reasons to take this   ramelteon  8 MG tablet Commonly known as: ROZEREM  Take 1 tablet (8 mg total) by mouth at bedtime. What changed:  when to take this reasons to take this   Saline Flush 0.9 % Soln Use 5 mLs by Intracatheter route daily as directed.   sertraline  50 MG tablet Commonly known as: ZOLOFT  Take 25 mg by mouth at bedtime.   sevelamer  carbonate 800 MG tablet Commonly known as: RENVELA  Take 1,600 mg by mouth 3 (three) times daily with meals.   timolol  0.5 % ophthalmic solution Commonly known as: TIMOPTIC  Place 1 drop into both eyes in the morning and at bedtime.   traMADol  50 MG tablet Commonly known as: ULTRAM  Take 1 tablet (50 mg total) by mouth every 8 (eight) hours as needed (for pain). What changed: reasons to take this   Tylenol  325 MG tablet Generic drug: acetaminophen  Take 325-650 mg by mouth every 6 (six) hours as needed  for mild pain (pain score 1-3) or headache.   VITAMIN B-12 PO Take 1 capsule by mouth in the morning and at bedtime.               Discharge Care Instructions  (From admission, onward)           Start     Ordered   12/03/23 0000  Discharge wound care:       Comments: Empty JP drain daily. Change bandage around drain every 3 days   12/03/23 1136            Contact information for follow-up providers     Almira Jaeger, MD. Schedule an appointment as soon as possible for a visit in 1 week(s).   Specialty: Family Medicine Contact information: [address] [place] [address] [phone]              Contact information for after-discharge care     Destination     [facility] Preferred SNF .   Service: Skilled Nursing Contact information: [address] [place]   [address] [phone]                     Discharge Instructions     Discharge wound care:   Complete by: As directed    Empty JP drain daily. Change bandage around drain every 3 days       Discharge Exam: Filed Weights   11/30/23 1140 12/02/23 1535 12/02/23 1845  Weight: 69.1 kg 72.5 kg 72.6 kg    General: Pt is alert, awake, not in acute distress Cardiovascular: RRR, nl S1-S2, no murmurs appreciated.   No LE edema.   Respiratory: Normal respiratory rate and rhythm.  CTAB without rales or wheezes. Abdominal: Abdomen soft and non-tender.  No distension or HSM.   Neuro/Psych: Strength symmetric in upper and lower extremities.  Judgment and insight appear normal.   Condition at discharge: fair  The results of significant diagnostics from this hospitalization (including imaging, microbiology, ancillary and laboratory) are listed below for reference.   Imaging Studies: MR BRAIN WO CONTRAST Result Date: 11/29/2023 CLINICAL DATA:  Seizure. EXAM: MRI HEAD WITHOUT CONTRAST TECHNIQUE: Multiplanar, multiecho pulse sequences of the brain and surrounding structures were obtained without intravenous contrast. COMPARISON:  Head CT from yesterday FINDINGS: Brain: No acute infarction, hemorrhage, hydrocephalus, extra-axial collection or mass lesion. Generalized brain atrophy, subjective mild for age. Chronic small vessel ischemia to a mild degree in the periventricular white matter. Mild generalized atrophy. No chronic blood products or focal cortical abnormality to correlate with seizure history. Incomplete study, no coronal T2 weighted sequences. Vascular: Major flow voids are preserved. Skull and upper cervical spine: No focal marrow lesion. Upper cervical facet ankylosis. Sinuses/Orbits: Mild generalized mucosal thickening in paranasal sinuses and maxillary sinuses. Negative orbits. IMPRESSION: Aging brain without acute or reversible finding.  No focal correlate for seizure history. Pervasive motion artifact with mild exam truncation Electronically Signed   By: Ronnette Coke M.D.   On: 11/29/2023 10:37   EEG adult Result Date: 11/28/2023 Eleni Griffin, MD     11/29/2023 12:30 PM Routine EEG Report Benecio Kluger is a 88 y.o. male with a history of syncope who is undergoing an EEG to evaluate for seizures. Report: This EEG was acquired with electrodes placed according to the International 10-20 electrode system (including Fp1, Fp2, F3, F4, C3, C4, P3, P4, O1, O2, T3, T4, T5, T6, A1, A2, Fz,  Cz, Pz). The following electrodes were missing or displaced: none. The best background was 8.5 Hz which displayed intermittent mild diffuse slowing. This activity is reactive to stimulation. Drowsiness was manifested by background fragmentation; deeper stages of sleep were identified by K complexes and sleep spindles. There was no focal slowing. There were no interictal epileptiform discharges. There were no electrographic seizures identified. Photic stimulation and hyperventilation were not performed. Impression and clinical correlation: This EEG was obtained while awake and asleep and is abnormal due to intermittent mild diffuse slowing indicative of global cerebral dysfunction. Epileptiform abnormalities were not seen during this recording. Greg Leaks, MD [facility] [phone] If 7pm- 7am, please page neurology on call as listed in AMION.   CT HEAD WO CONTRAST ( ) Result Date: 11/28/2023 CLINICAL DATA:  Syncope/presyncope. EXAM: CT HEAD WITHOUT CONTRAST TECHNIQUE: Contiguous axial images were obtained from the base of the skull through the vertex without intravenous contrast. RADIATION DOSE REDUCTION: This exam was performed according to the departmental dose-optimization program which includes automated exposure control, adjustment of the mA and/or kV according to patient size and/or use of iterative reconstruction technique.  COMPARISON:  06/02/2022 FINDINGS: Brain: No evidence of acute infarction, hemorrhage, hydrocephalus, extra-axial collection or mass lesion/mass effect. There is mild diffuse low-attenuation within the subcortical and periventricular white matter compatible with chronic microvascular disease. Prominence of the sulci and ventricles compatible with brain atrophy. Vascular: No hyperdense vessel or unexpected calcification. Skull: Normal. Negative for fracture or focal lesion. Sinuses/Orbits: Chronic appearing mucosal thickening within bilateral maxillary sinuses. No acute abnormality. Other: None IMPRESSION: 1. No acute intracranial abnormalities. 2. Chronic microvascular disease and brain atrophy. 3. Chronic appearing mucosal thickening within bilateral maxillary sinuses. Electronically Signed   By: Kimberley Penman M.D.   On: 11/28/2023 05:38   CT PELVIS W CONTRAST Result Date: 11/27/2023 CLINICAL DATA:  Suspected perianal abscess or fistula. EXAM: CT PELVIS WITH CONTRAST TECHNIQUE: Multidetector CT imaging of the pelvis was performed using the standard protocol following the bolus administration of intravenous contrast. RADIATION DOSE REDUCTION: This exam was performed according to the departmental dose-optimization program which includes automated exposure control, adjustment of the mA and/or kV according to patient size and/or use of iterative reconstruction technique. CONTRAST:  75mL OMNIPAQUE  IOHEXOL  350 MG/ML SOLN COMPARISON:  Nov 25, 2023 FINDINGS: Urinary Tract: Urinary bladder is poorly distended and subsequently limited in evaluation. A moderate amount of mildly dense heterogeneous material is seen within the bladder lumen (approximately 88.00 Hounsfield units). This represents a new finding when compared to the prior study. Stable, asymmetric moderate severity urinary bladder wall thickening is seen, most prominent along the anterior and lateral aspect of the urinary bladder, left greater than right.  Bowel: There is evidence of prior total colectomy. No dilated bowel loops are seen. Vascular/Lymphatic: There is marked severity calcification of the visualized portion of the abdominal aorta. This extends to involve the bilateral common iliac arteries and their branches. A patent left femoral loop dialysis graft is seen. Reproductive: There is stable, moderate to marked severity prostatomegaly. A penile pump is in place with its associated reservoir seen within the anterior aspect of the lower pelvis on the right. Other:  A right lower quadrant ostomy site is seen. Musculoskeletal: A left-sided transgluteal surgical drain is again seen. Its distal tip is seen within the presacral space and is surrounded by a moderate amount of stable soft tissue thickening and inflammatory fat stranding. No residual abscess is identified. Degenerative changes are seen within the visualized portion of  the lower lumbar spine. No acute osseous abnormalities are identified. IMPRESSION: 1. Stable left-sided transgluteal surgical drain with its distal tip seen within the presacral space. No residual abscess is identified. 2. Moderate amount of mildly dense heterogeneous material within the urinary bladder lumen which may represent diluted contrast versus blood products. Correlation with urinalysis is recommended. 3. Stable, asymmetric moderate severity urinary bladder wall thickening. Sequelae associated with an underlying neoplastic process cannot be excluded 4. Evidence of prior total colectomy with subsequent right lower quadrant ostomy site. 5. Aortic atherosclerosis. Electronically Signed   By: Virgle Grime M.D.   On: 11/27/2023 20:08   DG Chest Port 1 View Result Date: 11/27/2023 CLINICAL DATA:  Possible sepsis EXAM: PORTABLE CHEST 1 VIEW COMPARISON:  11/08/2023 FINDINGS: Cardiomegaly with mild atelectasis or scar at left base. No acute airspace disease, pleural effusion or pneumothorax. Aortic atherosclerosis. Right-sided  subclavian vascular stent. IMPRESSION: No active disease. Cardiomegaly. Electronically Signed   By: Esmeralda Hedge M.D.   On: 11/27/2023 16:15   CT ABDOMEN PELVIS W CONTRAST Result Date: 11/25/2023 CLINICAL DATA:  88 year old male with recurrent presacral abscess, status post drainage EXAM: CT ABDOMEN AND PELVIS WITH CONTRAST TECHNIQUE: Multidetector CT imaging of the abdomen and pelvis was performed using the standard protocol following bolus administration of intravenous contrast. RADIATION DOSE REDUCTION: This exam was performed according to the departmental dose-optimization program which includes automated exposure control, adjustment of the mA and/or kV according to patient size and/or use of iterative reconstruction technique. CONTRAST:  ISOVUE -300 IOPAMIDOL  (ISOVUE -300) INJECTION 61% COMPARISON:  11/09/2023 FINDINGS: Lower chest: Scarring/atelectasis in the lower lungs with no acute finding. Hepatobiliary: Liver parenchyma unremarkable. Calcified cholelithiasis. No inflammatory changes. Pancreas: Unremarkable Spleen: Unremarkable Adrenals/Urinary Tract: - Right adrenal gland:  Unremarkable - Left adrenal gland: Unremarkable. - Right kidney: Atrophic kidney with cortical thinning. No hydronephrosis. No ureteral dilation. - Left Kidney: Atrophic kidney with cortical thinning. Percutaneous nephrostomy remains in position. Unremarkable ureter. Simple cyst on the lateral cortex. - Urinary Bladder: Urinary bladder decompressed. Stomach/Bowel: - Stomach: Small hiatal hernia.  Otherwise unremarkable stomach. - Small bowel: Unremarkable small bowel. Ostomy right lower quadrant. - Appendix: Colectomy - Colon: Surgical changes colectomy Vascular/Lymphatic: Atherosclerotic changes of the aorta. Mesenteric arteries patent. Renal arteries patent. Bilateral iliac arteries and proximal femoral arteries patent. A patent left femoral loop dialysis graft is partially imaged and patent. Reproductive: Penile pump in the  pelvis. Unremarkable prostate. Other: Surgical drain remains in place in the presacral space with decompression of recent abscess. Persistent thickening of the soft tissue planes, with no adenopathy. Musculoskeletal: Degenerative changes of the visualized spine. Grade 2 anterolisthesis of L5 on S1 with bilateral pars defect. Vacuum disc phenomenon L5-S1. IMPRESSION: Well-positioned left transgluteal percutaneous drainage catheter in the presacral space, with resolution of the abscess. Ill-defined postsurgical/post treatment changes and post inflammatory changes in this region. Additional ancillary findings as above. Electronically Signed   By: Myrlene Asper D.O.   On: 11/25/2023 13:17   IR Radiologist Eval & Mgmt Result Date: 11/25/2023 EXAM: NEW PATIENT OFFICE VISIT CHIEF COMPLAINT: Electronic medical record HISTORY OF PRESENT ILLNESS: Electronic medical record REVIEW OF SYSTEMS: Electronic medical record PHYSICAL EXAMINATION: Electronic medical record ASSESSMENT AND PLAN: Electronic medical record Electronically Signed   By: Myrlene Asper D.O.   On: 11/25/2023 12:41   CT GUIDED PERITONEAL/RETROPERITONEAL FLUID DRAIN BY PERC CATH Result Date: 11/11/2023 INDICATION: 88 year old male with a history of presacral fluid collection referred for drainage EXAM: CT-GUIDED ABSCESS DRAIN TECHNIQUE: Multidetector CT imaging  of the pelvis was performed following the standard protocol without IV contrast. RADIATION DOSE REDUCTION: This exam was performed according to the departmental dose-optimization program which includes automated exposure control, adjustment of the mA and/or kV according to patient size and/or use of iterative reconstruction technique. MEDICATIONS: The patient is currently admitted to the hospital and receiving intravenous antibiotics. The antibiotics were administered within an appropriate time frame prior to the initiation of the procedure. ANESTHESIA/SEDATION: Moderate (conscious) sedation was  employed during this procedure. A total of Versed  1.0 mg and Fentanyl  50 mcg was administered intravenously by the radiology nurse. Total intra-service moderate Sedation Time: 14 minutes. The patient's level of consciousness and vital signs were monitored continuously by radiology nursing throughout the procedure under my direct supervision. COMPLICATIONS: None PROCEDURE: Informed written consent was obtained from the patient after a thorough discussion of the procedural risks, benefits and alternatives. All questions were addressed. Maximal Sterile Barrier Technique was utilized including caps, mask, sterile gowns, sterile gloves, sterile drape, hand hygiene and skin antiseptic. A timeout was performed prior to the initiation of the procedure. Patient was positioned right decubitus on the CT gantry table. Scout CT acquired for planning purposes. The patient is prepped and draped in the usual sterile fashion. 1% lidocaine  was used for local anesthesia. Using CT guidance, a Yueh needle was advanced into the presacral location from a left transgluteal approach. Once we confirmed the catheter position modified Seldinger technique was used to place a 10 [place] drain. Approximately 20 cc of frankly purulent material aspirated for culture. Drain was attached to bulb suction. Final image was stored. Patient tolerated the procedure well and remained hemodynamically stable throughout. No complications were encountered and no significant blood loss. IMPRESSION: Status post CT-guided left transgluteal drain for presacral abscess. Signed, Marciano Settles. Rexine Cater, RPVI [facility] Radiology Electronically Signed   By: Myrlene Asper D.O.   On: 11/11/2023 17:10   ECHOCARDIOGRAM COMPLETE Result Date: 11/10/2023    ECHOCARDIOGRAM REPORT   Patient Name:   Masyn Rostro Date of Exam: 11/10/2023 Medical Rec #:  [id number]       Height:       68.0 in Accession #:    [id number]       Weight:       151.7 lb Date of Birth:  1935-12-17       BSA:          1.817 m Patient Age:    87 years        BP:           89/59 mmHg Patient Gender: M               HR:           58 bpm. Exam Location:  Inpatient Procedure: 2D Echo, Color Doppler and Cardiac Doppler (Both Spectral and Color            Flow Doppler were utilized during procedure). Indications:    R07.9* Chest pain, unspecified  History:        Patient has prior history of Echocardiogram examinations, most                 recent 05/27/2022.  Sonographer:    Andrena Bang Referring Phys: CALLIE E GOODRICH IMPRESSIONS  1. Left ventricular ejection fraction, by estimation, is 55 to 60%. The left ventricle has normal function. The left ventricle has no regional wall motion abnormalities. Left ventricular diastolic parameters are consistent with  Grade I diastolic dysfunction (impaired relaxation).  2. Right ventricular systolic function is normal. The right ventricular size is moderately enlarged.  3. The mitral valve is normal in structure. No evidence of mitral valve regurgitation. No evidence of mitral stenosis.  4. Tricuspid valve regurgitation is moderate.  5. The aortic valve is calcified. There is mild calcification of the aortic valve. There is mild thickening of the aortic valve. Aortic valve regurgitation is mild. Aortic valve sclerosis is present, with no evidence of aortic valve stenosis. Aortic valve mean gradient measures 4.0 mmHg. Aortic valve Vmax measures 1.46 m/s.  6. Aorta measured 40 mm on CT chest 11/09/2023. Aortic dilatation noted. There is mild dilatation of the ascending aorta, measuring 40 mm.  7. The inferior vena cava is normal in size with greater than 50% respiratory variability, suggesting right atrial pressure of 3 mmHg. FINDINGS  Left Ventricle: Left ventricular ejection fraction, by estimation, is 55 to 60%. The left ventricle has normal function. The left ventricle has no regional wall motion abnormalities. Definity  contrast  agent was given IV to delineate the left ventricular  endocardial borders. The left ventricular internal cavity size was normal in size. There is no left ventricular hypertrophy. Left ventricular diastolic parameters are consistent with Grade I diastolic dysfunction (impaired relaxation). Right Ventricle: The right ventricular size is moderately enlarged. No increase in right ventricular wall thickness. Right ventricular systolic function is normal. Left Atrium: Left atrial size was normal in size. Right Atrium: Right atrial size was normal in size. Pericardium: There is no evidence of pericardial effusion. Mitral Valve: The mitral valve is normal in structure. No evidence of mitral valve regurgitation. No evidence of mitral valve stenosis. MV peak gradient, 5.7 mmHg. The mean mitral valve gradient is 3.0 mmHg. Tricuspid Valve: The tricuspid valve is normal in structure. Tricuspid valve regurgitation is moderate . No evidence of tricuspid stenosis. Aortic Valve: The aortic valve is calcified. There is mild calcification of the aortic valve. There is mild thickening of the aortic valve. Aortic valve regurgitation is mild. Aortic valve sclerosis is present, with no evidence of aortic valve stenosis. Aortic valve mean gradient measures 4.0 mmHg. Aortic valve peak gradient measures 8.5 mmHg. Aortic valve area, by VTI measures 2.72 cm. Pulmonic Valve: The pulmonic valve was normal in structure. Pulmonic valve regurgitation is trivial. No evidence of pulmonic stenosis. Aorta: Aorta measured 40 mm on CT chest 11/09/2023. Aortic dilatation noted. There is mild dilatation of the ascending aorta, measuring 40 mm. Venous: The inferior vena cava is normal in size with greater than 50% respiratory variability, suggesting right atrial pressure of 3 mmHg. IAS/Shunts: No atrial level shunt detected by color flow Doppler.  LEFT VENTRICLE PLAX 2D LVIDd:         4.60 cm      Diastology LVIDs:         3.00 cm      LV e' medial:    7.29  cm/s LV PW:         1.40 cm      LV E/e' medial:  16.7 LV IVS:        0.70 cm      LV e' lateral:   9.03 cm/s LVOT diam:     2.20 cm      LV E/e' lateral: 13.5 LV SV:         89 LV SV Index:   49 LVOT Area:     3.80 cm  LV Volumes (MOD) LV vol d,  MOD A2C: 119.0 ml LV vol d, MOD A4C: 151.0 ml LV vol s, MOD A2C: 38.3 ml LV vol s, MOD A4C: 61.0 ml LV SV MOD A2C:     80.7 ml LV SV MOD A4C:     151.0 ml LV SV MOD BP:      85.0 ml RIGHT VENTRICLE RV S prime:     10.90 cm/s TAPSE (M-mode): 1.9 cm LEFT ATRIUM           Index LA Vol (A2C): 91.9 ml 50.58 ml/m LA Vol (A4C): 85.2 ml 46.89 ml/m  AORTIC VALVE AV Area (Vmax):    2.92 cm AV Area (Vmean):   2.81 cm AV Area (VTI):     2.72 cm AV Vmax:           146.00 cm/s AV Vmean:          91.700 cm/s AV VTI:            0.328 m AV Peak Grad:      8.5 mmHg AV Mean Grad:      4.0 mmHg LVOT Vmax:         112.00 cm/s LVOT Vmean:        67.700 cm/s LVOT VTI:          0.235 m LVOT/AV VTI ratio: 0.72  AORTA Ao Asc diam: 4.60 cm MITRAL VALVE MV Area (PHT): 2.84 cm     SHUNTS MV Area VTI:   1.95 cm     Systemic VTI:  0.24 m MV Peak grad:  5.7 mmHg     Systemic Diam: 2.20 cm MV Mean grad:  3.0 mmHg MV Vmax:       1.19 m/s MV Vmean:      75.2 cm/s MV Decel Time: 267 msec MV E velocity: 122.00 cm/s MV A velocity: 116.00 cm/s MV E/A ratio:  1.05 Dorothye Gathers MD Electronically signed by Dorothye Gathers MD Signature Date/Time: 11/10/2023/1:22:42 PM    Final    US  Abdomen Limited RUQ (LIVER/GB) Result Date: 11/09/2023 CLINICAL DATA:  [id number] Abdominal pain [id number] Cholelithiases 92642 EXAM: ULTRASOUND ABDOMEN LIMITED RIGHT UPPER QUADRANT COMPARISON:  None Available. FINDINGS: Gallbladder: Gallbladder wall thickened. Shadowing stone at the fundus. No pericholecystic fluid. Common bile duct: Diameter: 0.7 cm. Liver: Liver is hyperechoic consistent with fatty infiltration. No focal hepatic lesions identified. No intrahepatic ductal dilatation. Hepatopetal portal vein flow. IMPRESSION: 1.  Cholelithiasis. Gallbladder wall thickening. 2. Fatty infiltration of the liver. Electronically Signed   By: Sydell Eva M.D.   On: 11/09/2023 22:15   CT Angio Chest/Abd/Pel for Dissection W and/or Wo Contrast Result Date: 11/09/2023 CLINICAL DATA:  Acute aortic syndrome suspected. Chest pain and shortness of breath. EXAM: CT ANGIOGRAPHY CHEST, ABDOMEN AND PELVIS TECHNIQUE: Non-contrast CT of the chest was initially obtained. Multidetector CT imaging through the chest, abdomen and pelvis was performed using the standard protocol during bolus administration of intravenous contrast. Multiplanar reconstructed images and MIPs were obtained and reviewed to evaluate the vascular anatomy. RADIATION DOSE REDUCTION: This exam was performed according to the departmental dose-optimization program which includes automated exposure control, adjustment of the mA and/or kV according to patient size and/or use of iterative reconstruction technique. CONTRAST:  OMNIPAQUE  IOHEXOL  350 MG/ML SOLN COMPARISON:  Chest radiograph 11/08/2023; CT abdomen pelvis 09/06/2023; CT chest 08/18/2023 FINDINGS: CTA CHEST FINDINGS Cardiovascular: No intramural hematoma, penetrating atherosclerotic ulcer, or dissection. Normal caliber thoracic aorta. Aortic and coronary artery atherosclerotic calcification. Right subclavian vein stent. Mediastinum/Nodes: Trachea is patent.  Unremarkable thyroid and esophagus. No lymphadenopathy. Lungs/Pleura: Lower lobe atelectasis and scarring. Otherwise no focal consolidation. No pleural effusion or pneumothorax. 7 mm right lower lobe nodule on series 7/image 103 is unchanged. Musculoskeletal: No acute fracture. Review of the MIP images confirms the above findings. CTA ABDOMEN AND PELVIS FINDINGS VASCULAR Aorta: Calcified atherosclerotic plaque throughout the abdominal aorta without hemodynamically significant stenosis. No aneurysm or dissection. Celiac: Severe narrowing at the origin secondary to  calcified atherosclerotic plaque. No aneurysm or dissection. SMA: Calcified plaque without hemodynamically significant stenosis. No aneurysm or dissection. Renals: Calcified plaque at the origin of the left renal artery. No hemodynamically significant stenosis. No aneurysm or dissection. IMA: Not visualized and likely occluded at the origin. Inflow: Redemonstrated surgical grafts arising from the left common femoral artery and vein extending inferiorly in the subcutaneous ventral left thigh. No hemodynamically significant stenosis. No aneurysm or dissection. Veins: No obvious acute venous abnormality within the limitations of this arterial phase study. Review of the MIP images confirms the above findings. NON-VASCULAR Hepatobiliary: Cholelithiasis. No evidence of acute cholecystitis. Unremarkable liver and biliary tree. Pancreas: Unremarkable. Spleen: Unremarkable. Adrenals/Urinary Tract: Normal adrenal glands. Atrophic native kidneys with multiple small hypoattenuating lesions statistically likely to represent cysts. No follow-up recommended. Percutaneous nephrostomy tube in the left kidney. No urinary calculi or hydronephrosis. Irregular asymmetric left wall thickening about the nondistended bladder. 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 Herniation of a nonobstructed loop of small bowel into the parastomal hernia. No bowel wall thickening or obstruction. Stomach is within normal limits. Lymphatic: No lymphadenopathy. Reproductive: No acute abnormality.  Penile prosthesis. Other: The presacral peripherally enhancing fluid collection has slightly increased in size compared to 09/06/2023 now measuring 4.7 x 5.5 cm in the axial plane, previously 3.7 x 5.5 cm. Extension of the fluid collection into the left ischial anal fossa. Mild adjacent stranding. No free intraperitoneal air. Musculoskeletal: No acute fracture. The presacral fluid collection abuts the anterior cortex  of the sacrum which demonstrates mild irregularity suspicious for osteomyelitis. 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 Review of the MIP images confirms the above findings. IMPRESSION: 1. No acute aortic syndrome. 2. The presacral fluid collection has slightly increased in size compared to 09/06/2023. Extension of the fluid collection into the left ischioanal fossa. The presacral fluid collection abuts the anterior cortex of the sacrum which demonstrates mild irregularity suspicious for osteomyelitis. 3. Irregular asymmetric left bladder wall thickening similar to prior. Cystitis versus malignancy. 4. Cholelithiasis. 5. Stable 7 mm right lower lobe nodule. Electronically Signed   By: Rozell Cornet M.D.   On: 11/09/2023 00:56   DG Chest 2 View Result Date: 11/08/2023 CLINICAL DATA:  Shortness of breath and chest pain EXAM: CHEST - 2 VIEW COMPARISON:  Radiograph 07/07/2023 FINDINGS: Stable cardiomediastinal silhouette. Calcified tortuous aorta. Low lung volumes accentuate pulmonary vascularity. Chronic bronchitic changes. Left basilar atelectasis. No pleural effusion or pneumothorax. Right subclavian vascular stent. IMPRESSION: Chronic bronchitic changes.  Left basilar atelectasis/scarring. Electronically Signed   By: Rozell Cornet M.D.   On: 11/08/2023 23:06    Microbiology: Results for orders placed or performed during the hospital encounter of 11/27/23  Resp panel by RT-PCR (RSV, Flu A&B, Covid) Anterior Nasal Swab     Status: None   Collection Time: 11/27/23  2:04 PM   Specimen: Anterior Nasal Swab  Result Value Ref Range Status   SARS Coronavirus 2 by RT PCR NEGATIVE NEGATIVE Final   Influenza A by PCR NEGATIVE  NEGATIVE Final   Influenza B by PCR NEGATIVE NEGATIVE Final    Comment: (NOTE) The Xpert Xpress SARS-CoV-2/FLU/RSV plus assay is intended as an aid in the diagnosis of influenza from Nasopharyngeal swab specimens and should not be used as a sole basis for  treatment. Nasal washings and aspirates are unacceptable for Xpert Xpress SARS-CoV-2/FLU/RSV testing.  Fact Sheet for Patients: BloggerCourse.com  Fact Sheet for Healthcare Providers: SeriousBroker.it  This test is not yet approved or cleared by the [place]  FDA and has been authorized for detection and/or diagnosis of SARS-CoV-2 by FDA under an Emergency Use Authorization (EUA). This EUA will remain in effect (meaning this test can be used) for the duration of the COVID-19 declaration under Section 564(b)(1) of the Act, 21 U.S.C. section 360bbb-3(b)(1), unless the authorization is terminated or revoked.     Resp Syncytial Virus by PCR NEGATIVE NEGATIVE Final    Comment: (NOTE) Fact Sheet for Patients: BloggerCourse.com  Fact Sheet for Healthcare Providers: SeriousBroker.it  This test is not yet approved or cleared by the [place]  FDA and has been authorized for detection and/or diagnosis of SARS-CoV-2 by FDA under an Emergency Use Authorization (EUA). This EUA will remain in effect (meaning this test can be used) for the duration of the COVID-19 declaration under Section 564(b)(1) of the Act, 21 U.S.C. section 360bbb-3(b)(1), unless the authorization is terminated or revoked.  Performed at [facility], [address] [place], [place] [address]   Blood Culture (routine x 2)     Status: None   Collection Time: 11/27/23  2:12 PM   Specimen: BLOOD RIGHT FOREARM  Result Value Ref Range Status   Specimen Description BLOOD RIGHT FOREARM  Final   Special Requests   Final    BOTTLES DRAWN AEROBIC AND ANAEROBIC Blood Culture results may not be optimal due to an inadequate volume of blood received in culture bottles   Culture   Final    NO GROWTH 5 DAYS Performed at [facility], [address] [place], [place] [address]    Report Status 12/02/2023 FINAL   Final  Blood Culture (routine x 2)     Status: None   Collection Time: 11/27/23  2:30 PM   Specimen: BLOOD  Result Value Ref Range Status   Specimen Description BLOOD RIGHT ANTECUBITAL  Final   Special Requests   Final    BOTTLES DRAWN AEROBIC AND ANAEROBIC Blood Culture adequate volume   Culture   Final    NO GROWTH 5 DAYS Performed at [facility], [address] [place], [place] [address]    Report Status 12/02/2023 FINAL  Final    Labs: CBC: Recent Labs  Lab 11/27/23 1412 11/28/23 0420 11/29/23 0230 11/30/23 0500 12/01/23 0500 12/02/23 0428  WBC 7.9 7.1 8.1 8.2 6.7 6.4  NEUTROABS 6.9  --   --  6.5  --   --   HGB 8.0* 8.0* 7.5* 7.7* 7.4* 7.7*  HCT 25.2* 26.1* 23.4* 23.7* 22.5* 22.9*  MCV 121.2* 126.1* 121.2* 120.3* 120.3* 118.7*  PLT 171 150 171 177 167 193   Basic Metabolic Panel: Recent Labs  Lab 11/28/23 0420 11/29/23 0230 11/30/23 0500 12/01/23 0500 12/02/23 0428  NA 140 139 139 137 136  K 4.2 3.8 3.7 4.1 4.9  CL 100 101 99 97* 101  CO2 24 26 24 28 26   GLUCOSE 120* 92 128* 113* 92  BUN 16 21 39* 27* 44*  CREATININE 5.12* 6.14* 8.06* 5.24* 7.05*  CALCIUM  8.7* 8.7* 8.6* 8.8* 8.8*  PHOS  --   --  5.6* 4.0 4.6   Liver Function Tests: Recent Labs  Lab 11/27/23 1412 11/29/23 0230 11/30/23 0500 12/01/23 0500 12/02/23 0428  AST 25 23  --   --   --   ALT 15 16  --   --   --   ALKPHOS 69 56  --   --   --   BILITOT 0.8 1.1  --   --   --   PROT 6.6 5.8*  --   --   --   ALBUMIN  3.3* 3.1* 3.0* 3.0* 3.0*   CBG: Recent Labs  Lab 11/28/23 0223 11/28/23 0635 11/29/23 0120  GLUCAP 104* 92 85    Discharge time spent: approximately 45 minutes spent on discharge counseling, evaluation of patient on day of discharge, and coordination of discharge planning with nursing, social work, pharmacy and case management  Signed: Ephriam Hashimoto, MD [facility] 12/03/2023

## 2023-12-03 NOTE — Plan of Care (Signed)
  Problem: Nutrition: Goal: Adequate nutrition will be maintained Outcome: Progressing   Problem: Coping: Goal: Level of anxiety will decrease Outcome: Progressing   Problem: Elimination: Goal: Will not experience complications related to bowel motility Outcome: Progressing   Problem: Pain Managment: Goal: General experience of comfort will improve and/or be controlled Outcome: Progressing   Problem: Skin Integrity: Goal: Risk for impaired skin integrity will decrease Outcome: Progressing

## 2023-12-03 NOTE — Progress Notes (Signed)
 Contacted by CSW regarding pt's d/c to snf today. Contacted [facility] to be advised of pt's d/c today and that pt should resume care tomorrow. SNF name provided to clinic as well. Pt's HD info added to AVS.   Lauraine Polite Renal Navigator [phone]

## 2023-12-03 NOTE — Evaluation (Signed)
 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   Anastomotic leak of intestine 07/14/2023   History of COPD 07/08/2023   History of anemia due to chronic kidney disease 07/08/2023   Abscess of groin, left 07/07/2023   Macrocytic anemia 04/08/2023   Depression 04/08/2023   Abnormal bladder CT 03/23/2023   Infection due to ESBL-producing Escherichia coli 03/23/2023   DOE (dyspnea on exertion) 07/22/2022   Coag negative Staphylococcus bacteremia 06/06/2022   History of sepsis 06/02/2022   Ulcerative colitis (HCC) 06/02/2022   End-stage renal disease on hemodialysis (HCC) 05/06/2022   SBO (small bowel obstruction) (HCC) 11/18/2021   Glaucoma 11/18/2021   Paroxysmal atrial fibrillation (HCC) 11/18/2021   Orthostatic hypotension 10/22/2021   Idiopathic neuropathy 10/22/2021   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   Nephrostomy present (HCC) 10/22/2021   Past Medical History:  Past Medical History:  Diagnosis Date   A-fib (HCC)    Anemia    Arthritis    Cancer (HCC)    Basal cell   COVID-19    2021   Dysrhythmia    Afib-controlled on eliquis    ESRD (end stage renal disease) (HCC) 10/22/2021   Glaucoma 11/18/2021   History of DVT (deep vein thrombosis)    Hydronephrosis    managed wtih a PCN   Idiopathic neuropathy 10/22/2021   lyrica     Ileostomy in place [facility])    Obstructive uropathy    With chronic left nephrostomy   Old retinal detachment, total or subtotal     Orthostatic hypotension 10/22/2021   Sleep apnea    does not need a machine   Stroke (HCC)    Ulcerative colitis (HCC)    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   Past Surgical History:  Past Surgical History:  Procedure Laterality Date   BASAL CELL CARCINOMA EXCISION     10/23   COLON SURGERY     creation of j pouch     and subsequent takedown of j pouch   EYE SURGERY     IR NEPHROSTOMY EXCHANGE LEFT  12/10/2021   IR NEPHROSTOMY EXCHANGE LEFT  04/22/2022   IR NEPHROSTOMY EXCHANGE LEFT  07/29/2022   IR NEPHROSTOMY EXCHANGE LEFT  10/28/2022   IR NEPHROSTOMY EXCHANGE LEFT  02/05/2023   IR NEPHROSTOMY EXCHANGE LEFT  05/07/2023   IR NEPHROSTOMY EXCHANGE LEFT  06/25/2023   IR NEPHROSTOMY EXCHANGE LEFT  07/17/2023   IR NEPHROSTOMY EXCHANGE LEFT  10/22/2023   IR RADIOLOGIST EVAL & MGMT  11/25/2023   LEFT HEART CATH AND CORONARY ANGIOGRAPHY N/A 07/22/2022   Procedure: LEFT HEART CATH AND CORONARY ANGIOGRAPHY;  Surgeon: Swazil[place], Peter M, MD;  Location: [facility];  Service: Cardiovascular;  Laterality: N/A;   REVISION OF ARTERIOVENOUS GORETEX GRAFT Left 05/06/2022   Procedure: REDO LEFT THIGH ARTERIOVENOUS 4-7 MM GORETEX GRAFT;  Surgeon: Dannis Dy, MD;  Location: [facility] OR;  Service: Vascular;  Laterality: Left;   SMALL INTESTINE SURGERY     TOTAL COLECTOMY  HPI:  88 yo male admitted 11/27/23 for syncope and hypotension. EEG negative for Sz. PMHx: - ESRD on HD, Afib, CVA, depression, colectomy, ileostomy, chronic hypotension, DVT, acute pericarditis, lt groin abscess with drain, nephrostomy tubes, CHF, COPD, glaucoma   Assessment / Plan / Recommendation Clinical Impression  Pt presented with occasional difficulty with tasks of verbal recall and following multi-step commands.  He scored a 27/30 on the Mini Mental State Exam, considered WFL.   Speech was clear and fluent.  Expressive and receptive language were WNL. Verbal problem solving and judgment were WNL.  Reading/writing WFL. No deficits identified that would warrant SLP f/u.  Our service will sign off.    SLP Assessment  SLP Recommendation/Assessment: Patient does not need any further Speech Lanaguage Pathology Services SLP Visit Diagnosis: Cognitive communication deficit (R41.841)    Recommendations for follow up therapy are one component of a multi-disciplinary discharge planning process, led by the attending physician.  Recommendations may be updated based on patient status, additional functional criteria and insurance authorization.    Follow Up Recommendations  No SLP follow up                       SLP Evaluation Cognition  Overall Cognitive Status: Within Functional Limits for tasks assessed Arousal/Alertness: Awake/alert Orientation Level: Oriented X4 Attention: Selective Selective Attention: Appears intact Memory: Impaired Memory Impairment: Retrieval deficit Awareness: Appears intact Problem Solving: Appears intact Safety/Judgment: Appears intact       Comprehension  Auditory Comprehension Overall Auditory Comprehension: Appears within functional limits for tasks assessed Reading Comprehension Reading Status: Within funtional limits    Expression Expression Primary Mode of Expression: Verbal Verbal Expression Overall Verbal Expression: Appears within functional limits for tasks assessed Written Expression Dominant Hand: Right Written Expression: Within Functional Limits   Oral / Motor  Oral Motor/Sensory Function Overall Oral Motor/Sensory Function: Within functional limits Motor Speech Overall Motor Speech: Appears within functional limits for tasks assessed            Myna Asal Laurice 12/03/2023, 11:21 AM Mylinda Asa L. Beatris Lincoln, MA CCC/SLP Clinical Specialist - Acute Care SLP Acute Rehabilitation Services Office number [phone]

## 2023-12-03 NOTE — TOC Transition Note (Signed)
 Transition of Care [facility]) - Discharge Note   Patient Details  Name: Joshua Riley MRN: [id number] Date of Birth: [birth year]  Transition of [facility] CM/SW Contact:  Carmon Christen, LCSWA Phone Number: 12/03/2023, 11:42 AM   Clinical Narrative:     [facility]   Anticipated DC date: 12/03/2023  Family notified: Mollie Anger  Transport by: Lyna Sandhoff  ?  Per MD patient ready for DC to [facility] . RN, patient, patient's family, Sherrlyn Dolores Renal Navigator,and facility notified of DC. Discharge Summary sent to facility. RN given number for report [phone] RM# [id number]. DC packet on chart.DNR signed by MD attached to patients DC packet. Ambulance transport requested for patient.  CSW signing off.   Final next level of care: Skilled Nursing Facility Barriers to Discharge: No Barriers Identified   Patient Goals and CMS Choice Patient states their goals for this hospitalization and ongoing recovery are:: SNF CMS Medicare.gov Compare Post Acute Care list provided to:: Patient Represenative (must comment) (patient and daughter Mollie Anger) Choice offered to / list presented to : Patient, Adult Children (patient and daughter Mollie Anger)      Discharge Placement              Patient chooses bed at: [facility] Patient to be transferred to facility by: [facility] Name of family member notified: Mollie Anger Patient and family notified of of transfer: 12/03/23  Discharge Plan and Services Additional resources added to the After Visit Summary for   In-house Referral: Clinical Social Work Discharge Planning Services: CM Consult Post Acute Care Choice: Skilled Nursing Facility            DME Agency: NA                  Social Drivers of Health (SDOH) Interventions SDOH Screenings   Food Insecurity: No Food Insecurity (11/28/2023)  Housing: Low Risk  (11/28/2023)  Transportation Needs: No Transportation Needs (11/28/2023)  Utilities: Not At Risk (11/28/2023)  Alcohol Screen: Low  Risk  (10/26/2023)  Depression (PHQ2-9): Low Risk  (11/17/2023)  Financial Resource Strain: Low Risk  (10/26/2023)  Physical Activity: Insufficiently Active (10/26/2023)  Social Connections: Socially Isolated (11/28/2023)  Stress: Stress Concern Present (10/26/2023)  Tobacco Use: Medium Risk (11/28/2023)     Readmission Risk Interventions    12/03/2023   11:30 AM 11/10/2023    3:46 PM 07/21/2023   12:26 PM  Readmission Risk Prevention Plan  Transportation Screening Complete Complete Complete  PCP or Specialist Appt within 5-7 Days  Complete   Home Care Screening  Complete   Medication Review (RN CM)  Complete   HRI or Home Care Consult   Complete  Social Work Consult for Recovery Care Planning/Counseling   Complete  Palliative Care Screening   Not Applicable  Medication Review Oceanographer)   Complete  SW Recovery Care/Counseling Consult Complete    Palliative Care Screening Complete    Skilled Nursing Facility Complete

## 2023-12-03 NOTE — Progress Notes (Signed)
 [facility] Progress Note   Subjective:   Seen in room, reports mild dizziness but says he is always dizzy. Tolerated HD yesterday. Denies SOB, CP, dizziness, nausea.   Objective Vitals:   12/03/23 0025 12/03/23 0440 12/03/23 0743 12/03/23 1339  BP: (!) 85/56 (!) 93/56 94/60 95/62   Pulse: 74 72 70 71  Resp: 15 18    Temp:   97.8 F (36.6 C)   TempSrc:   Oral Oral  SpO2: 92%  100%   Weight:      Height:       Physical Exam General: Alert male in NAD Heart: RRR, no murmurs, rubs or gallops Lungs: CTA bilaterally, respirations unlabored on RA Abdomen: Soft, non-distended, +BS Extremities: No edema b/l lower extremities Dialysis Access:  thigh AVG  Additional Objective Labs: Basic Metabolic Panel: Recent Labs  Lab 11/30/23 0500 12/01/23 0500 12/02/23 0428  NA 139 137 136  K 3.7 4.1 4.9  CL 99 97* 101  CO2 24 28 26   GLUCOSE 128* 113* 92  BUN 39* 27* 44*  CREATININE 8.06* 5.24* 7.05*  CALCIUM  8.6* 8.8* 8.8*  PHOS 5.6* 4.0 4.6   Liver Function Tests: Recent Labs  Lab 11/27/23 1412 11/29/23 0230 11/30/23 0500 12/01/23 0500 12/02/23 0428  AST 25 23  --   --   --   ALT 15 16  --   --   --   ALKPHOS 69 56  --   --   --   BILITOT 0.8 1.1  --   --   --   PROT 6.6 5.8*  --   --   --   ALBUMIN  3.3* 3.1* 3.0* 3.0* 3.0*   No results for input(s): "LIPASE", "AMYLASE" in the last 168 hours. CBC: Recent Labs  Lab 11/27/23 1412 11/28/23 0420 11/29/23 0230 11/30/23 0500 12/01/23 0500 12/02/23 0428  WBC 7.9 7.1 8.1 8.2 6.7 6.4  NEUTROABS 6.9  --   --  6.5  --   --   HGB 8.0* 8.0* 7.5* 7.7* 7.4* 7.7*  HCT 25.2* 26.1* 23.4* 23.7* 22.5* 22.9*  MCV 121.2* 126.1* 121.2* 120.3* 120.3* 118.7*  PLT 171 150 171 177 167 193   Blood Culture    Component Value Date/Time   SDES BLOOD RIGHT ANTECUBITAL 11/27/2023 1430   SPECREQUEST  11/27/2023 1430    BOTTLES DRAWN AEROBIC AND ANAEROBIC Blood Culture adequate volume   CULT  11/27/2023 1430    NO GROWTH 5  DAYS Performed at [facility], [address], [place], [place] [address]    REPTSTATUS 12/02/2023 FINAL 11/27/2023 1430    Cardiac Enzymes: No results for input(s): "CKTOTAL", "CKMB", "CKMBINDEX", "TROPONINI" in the last 168 hours. CBG: Recent Labs  Lab 11/28/23 0223 11/28/23 0635 11/29/23 0120  GLUCAP 104* 92 85   Iron Studies: No results for input(s): "IRON", "TIBC", "TRANSFERRIN", "FERRITIN" in the last 72 hours. @lablastinr3 @ Studies/Results: No results found. Medications:   apixaban   2.5 mg Oral BID   atorvastatin   20 mg Oral Daily   calcitRIOL   0.5 mcg Oral Q M,W,F-HD   [START ON 12/07/2023] darbepoetin (ARANESP ) injection - DIALYSIS  150 mcg Subcutaneous Q Mon-1800   fludrocortisone  0.1 mg Oral Daily   midodrine   20 mg Oral TID WC   timolol   1 drop Both Eyes BID    Dialysis Orders: MWF - [facility] 2hrs31min, BFR 350, DFR Auto 1.5,  EDW 70kg, 2K/ 2.5Ca Heparin  None ordered Mircera 150 mcg q2wks - last 5/12  Calcitriol  0.5mcg PO qHD - last 5/23 Home meds: Midodrine  15mg  TID Renvela  0.8GM powder 2 packets TID  Assessment/Plan: Syncopal episode - appears patient also had seizure-like acitivity at admit, neurology evaluated. MRI brain showed no acute process and ECHO showed normal LVEF. See blow Chronic hypotension - still hypotensive despite Midodrine  15mg  TID. Raised Midodrine  to 20mg  TID, still no improvement and required several IVF boluses on 5/26. Florinef started, BP improved today. Raising EDW a bit at discharge ESRD -  on HD MWF.  Last CXR no acute process. = Volume  - Does not appear overloaded. See above Anemia of CKD - Hgb 7.7, tsat borderline but ferritin is high. Continue weekly aranesp - increased dose Secondary Hyperparathyroidism -  Corr Ca controlled. Phos at goal.Continue  Presacral fluid collection - S/p drain placement 5/7 in IR. On IV ABXs. Nutrition - Renal diet with fluid restriction  Ramona Burner, PA-C 12/03/2023,  1:42 PM  [facility] Pager: [phone]

## 2023-12-03 NOTE — Discharge Planning (Signed)
 [facility] Patient Discharge Orders- [facility] CLINIC: [facility]  Patient's name: Joshua Riley Admit/DC Dates: 11/27/2023 - 12/03/23  Discharge Diagnoses: syncope  Acute pericarditis  Aranesp : Given: Yes   Date and amount of last dose: N/A  Last Hgb: 7.7 PRBC's Given: no Date/# of units: N/A ESA dose for discharge: mircera 200 mcg IV q 2 weeks  IV Iron dose at discharge: none  Heparin  change: no  EDW Change: yes New EDW: 72.5kg  [place] Change: no  Access intervention/Change: no Details:  Hectorol/Calcitriol  change: no  Discharge Labs: Calcium  8.8 Phosphorus 4.6 Albumin  3.0 K+ 4.9  IV Antibiotics: none- finished course here Details:  On Coumadin?: no Last INR: Next INR: Managed By:   OTHER/APPTS/LAB ORDERS:    D/C Meds to be reconciled by nurse after every discharge.  Completed By: Ramona Burner, PA-C 12/03/2023, 11:42 AM  [facility] Pager: [phone]    Reviewed by: MD:______ RN_______

## 2023-12-03 NOTE — TOC Progression Note (Addendum)
 Transition of Care [facility]) - Progression Note    Patient Details  Name: Joshua Riley MRN: [id number] Date of Birth: 1936/06/30  [facility] CM/SW Contact  Carmon Christen, LCSWA Phone Number: 12/03/2023, 10:41 AM  Clinical Narrative:     CSW spoke with patients daughter who has now decided that she would like for patient to go to [facility]. CSW informed tanya with [facility]. CSW spoke with Darrien with [facility] who confirmed SNF bed for patient. Darrien is going to order supplies for patients ileostomy. Darrien confirmed supplies will be in by June 3rd and patient and patients daughter confirmed patient will have enough supplies until the supplies ordered by facility come in.Patients passr has been approved [phone] [id number]. HD schedule provided and confirmed with Darrien with [facility]. Darrien is going to give CSW a call back to confirm if patient can dc over today. MD informed CSW patient is medically stable.  Update- Darrien with [facility] confirmed they can accept patient today if medically stable. CSW informed MD.  Expected Discharge Plan: Skilled Nursing Facility Barriers to Discharge: Continued Medical Work up  Expected Discharge Plan and Services In-house Referral: Clinical Social Work Discharge Planning Services: CM Consult Post Acute Care Choice: Skilled Nursing Facility Living arrangements for the past 2 months: Single Family Home                   DME Agency: NA                   Social Determinants of Health (SDOH) Interventions SDOH Screenings   Food Insecurity: No Food Insecurity (11/28/2023)  Housing: Low Risk  (11/28/2023)  Transportation Needs: No Transportation Needs (11/28/2023)  Utilities: Not At Risk (11/28/2023)  Alcohol Screen: Low Risk  (10/26/2023)  Depression (PHQ2-9): Low Risk  (11/17/2023)  Financial Resource Strain: Low Risk  (10/26/2023)  Physical Activity: Insufficiently Active (10/26/2023)  Social Connections: Socially  Isolated (11/28/2023)  Stress: Stress Concern Present (10/26/2023)  Tobacco Use: Medium Risk (11/28/2023)    Readmission Risk Interventions    11/10/2023    3:46 PM 07/21/2023   12:26 PM  Readmission Risk Prevention Plan  Transportation Screening Complete Complete  PCP or Specialist Appt within 5-7 Days Complete   Home Care Screening Complete   Medication Review (RN CM) Complete   HRI or Home Care Consult  Complete  Social Work Consult for Recovery Care Planning/Counseling  Complete  Palliative Care Screening  Not Applicable  Medication Review Oceanographer)  Complete

## 2023-12-03 NOTE — Plan of Care (Signed)
  Problem: Education: Goal: Knowledge of General Education information will improve Description: Including pain rating scale, medication(s)/side effects and non-pharmacologic comfort measures 12/03/2023 1337 by Sunnie England, RN Outcome: Adequate for Discharge 12/03/2023 1337 by Sunnie England, RN Outcome: Adequate for Discharge   Problem: Health Behavior/Discharge Planning: Goal: Ability to manage health-related needs will improve 12/03/2023 1337 by Sunnie England, RN Outcome: Adequate for Discharge 12/03/2023 1337 by Sunnie England, RN Outcome: Adequate for Discharge   Problem: Clinical Measurements: Goal: Ability to maintain clinical measurements within normal limits will improve 12/03/2023 1337 by Sunnie England, RN Outcome: Adequate for Discharge 12/03/2023 1337 by Sunnie England, RN Outcome: Adequate for Discharge Goal: Will remain free from infection 12/03/2023 1337 by Sunnie England, RN Outcome: Adequate for Discharge 12/03/2023 1337 by Sunnie England, RN Outcome: Adequate for Discharge Goal: Diagnostic test results will improve 12/03/2023 1337 by Sunnie England, RN Outcome: Adequate for Discharge 12/03/2023 1337 by Sunnie England, RN Outcome: Adequate for Discharge Goal: Respiratory complications will improve 12/03/2023 1337 by Sunnie England, RN Outcome: Adequate for Discharge 12/03/2023 1337 by Sunnie England, RN Outcome: Adequate for Discharge Goal: Cardiovascular complication will be avoided 12/03/2023 1337 by Sunnie England, RN Outcome: Adequate for Discharge 12/03/2023 1337 by Sunnie England, RN Outcome: Adequate for Discharge   Problem: Activity: Goal: Risk for activity intolerance will decrease 12/03/2023 1337 by Sunnie England, RN Outcome: Adequate for Discharge 12/03/2023 1337 by Sunnie England, RN Outcome: Adequate for Discharge   Problem: Nutrition: Goal: Adequate nutrition will be  maintained 12/03/2023 1337 by Sunnie England, RN Outcome: Adequate for Discharge 12/03/2023 1337 by Sunnie England, RN Outcome: Adequate for Discharge   Problem: Coping: Goal: Level of anxiety will decrease 12/03/2023 1337 by Sunnie England, RN Outcome: Adequate for Discharge 12/03/2023 1337 by Sunnie England, RN Outcome: Adequate for Discharge   Problem: Elimination: Goal: Will not experience complications related to bowel motility 12/03/2023 1337 by Sunnie England, RN Outcome: Adequate for Discharge 12/03/2023 1337 by Sunnie England, RN Outcome: Adequate for Discharge Goal: Will not experience complications related to urinary retention 12/03/2023 1337 by Sunnie England, RN Outcome: Adequate for Discharge 12/03/2023 1337 by Sunnie England, RN Outcome: Adequate for Discharge   Problem: Pain Managment: Goal: General experience of comfort will improve and/or be controlled 12/03/2023 1337 by Sunnie England, RN Outcome: Adequate for Discharge 12/03/2023 1337 by Sunnie England, RN Outcome: Adequate for Discharge   Problem: Safety: Goal: Ability to remain free from injury will improve 12/03/2023 1337 by Sunnie England, RN Outcome: Adequate for Discharge 12/03/2023 1337 by Sunnie England, RN Outcome: Adequate for Discharge   Problem: Skin Integrity: Goal: Risk for impaired skin integrity will decrease 12/03/2023 1337 by Sunnie England, RN Outcome: Adequate for Discharge 12/03/2023 1337 by Sunnie England, RN Outcome: Adequate for Discharge

## 2023-12-07 ENCOUNTER — Ambulatory Visit: Admitting: Student

## 2023-12-08 ENCOUNTER — Other Ambulatory Visit (HOSPITAL_COMMUNITY): Payer: Self-pay

## 2023-12-09 ENCOUNTER — Telehealth: Payer: Self-pay

## 2023-12-09 NOTE — Telephone Encounter (Signed)
 You may write both letters  To whom it may concern,  Patient may take his medications on his own.  Thanks, Clarisa Crooked, MD  To whom it may concern,  Due to complexity of his medical situation/conditions patient can no longer live by himself-he requires higher level of support  Thanks, Clarisa Crooked, MD

## 2023-12-09 NOTE — Telephone Encounter (Signed)
 See below.  Copied from CRM [id number]. Topic: General - Other >> Dec 09, 2023  1:08 PM Lizabeth Riggs wrote: Reason for CRM:  He is moving into an assisted living and daughter needs the following: She needs a letter that states he can take his own medications on his own. She needs another letter that states he cannot live by himself due to medical conditions  She wants to pick up letters next Tuesday (12/15/23). Please call her when letters are ready at [phone]. Thanks

## 2023-12-10 ENCOUNTER — Telehealth: Payer: Self-pay | Admitting: Family Medicine

## 2023-12-10 NOTE — Telephone Encounter (Signed)
 Called and spoke with pt daughter and made aware that letters were written and up front for pick up.

## 2023-12-10 NOTE — Telephone Encounter (Signed)
 Patient's daughter came to pick up letters. Patient's daughter explained that he is in rehab right now and there will be a small period of time he will be between the rehab and assisted living. Patient's daughter wants to know if he needs an in office visit or not. Please advise.

## 2023-12-11 NOTE — Telephone Encounter (Signed)
 I am more than happy to see him for check up about recent hospitalizations

## 2023-12-11 NOTE — Telephone Encounter (Signed)
 FYI

## 2023-12-14 ENCOUNTER — Telehealth: Payer: Self-pay

## 2023-12-14 NOTE — Telephone Encounter (Signed)
 See below, if pt or pt daughter would like to schedule f/u visit to discuss hospitalizations.

## 2023-12-14 NOTE — Telephone Encounter (Signed)
 Forms received awaiting provider completion then will fax back.  Copied from CRM [id number]. Topic: General - Other >> Dec 11, 2023  2:19 PM Howard Macho wrote: Reason for CRM: krishawne from home care delivered called stating they faxed a physican order over on 6/2 and want to know the status of it CB [phone]

## 2023-12-15 ENCOUNTER — Encounter: Payer: Self-pay | Admitting: Family Medicine

## 2023-12-15 ENCOUNTER — Telehealth (INDEPENDENT_AMBULATORY_CARE_PROVIDER_SITE_OTHER): Admitting: Family Medicine

## 2023-12-15 DIAGNOSIS — I951 Orthostatic hypotension: Secondary | ICD-10-CM

## 2023-12-15 DIAGNOSIS — N186 End stage renal disease: Secondary | ICD-10-CM

## 2023-12-15 DIAGNOSIS — Z933 Colostomy status: Secondary | ICD-10-CM | POA: Diagnosis not present

## 2023-12-15 DIAGNOSIS — I480 Paroxysmal atrial fibrillation: Secondary | ICD-10-CM

## 2023-12-15 DIAGNOSIS — K51919 Ulcerative colitis, unspecified with unspecified complications: Secondary | ICD-10-CM

## 2023-12-15 DIAGNOSIS — Z992 Dependence on renal dialysis: Secondary | ICD-10-CM

## 2023-12-15 DIAGNOSIS — F32A Depression, unspecified: Secondary | ICD-10-CM

## 2023-12-15 NOTE — Progress Notes (Signed)
 Phone [phone] Virtual visit via Video note   Subjective:  Chief complaint: Chief Complaint  Patient presents with   hosp f/u   Home Health    Our team/I connected with Twana Gal at 11:20 AM EDT by a video enabled telemedicine application (caregility through epic) and verified that I am speaking with the correct person using two identifiers. Our team/I discussed the limitations of evaluation and management by telemedicine and the availability of in person appointments.No physical exam was performed (except for noted visual exam or audio findings with Telehealth visits).   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    Priority: High   Nephrostomy present (HCC) 10/22/2021    Priority: High   History of anemia due to chronic kidney disease 07/08/2023    Priority: Medium    Depression 04/08/2023    Priority: Medium    History of sepsis 06/02/2022    Priority: Medium    Glaucoma 11/18/2021    Priority: Medium    Idiopathic neuropathy 10/22/2021    Priority: Medium    Abnormal bladder CT 03/23/2023    Priority: Low   Coag negative Staphylococcus bacteremia 06/06/2022    Priority: Low   SBO (small bowel obstruction) (HCC) 11/18/2021    Priority: Low   Cerebrovascular disease 12/02/2023   Syncope 11/28/2023   Hypotension 11/28/2023   Seizure like activity 11/28/2023   Acute pericarditis 11/11/2023   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     albuterol  (VENTOLIN  HFA) 108 (90 Base) MCG/ACT inhaler Inhale 1-2 puffs into the lungs every 6 (six) hours as needed for wheezing or shortness of breath.     atorvastatin  (LIPITOR) 20 MG tablet Take 1 tablet (20 mg total) by mouth daily. 90 tablet 0   budesonide  (PULMICORT ) 0.5 MG/2ML nebulizer solution Take 0.5 mg by nebulization 2 (two) times daily as needed (for respiratory flares).     calcitRIOL  (ROCALTROL ) 0.5 MCG capsule Take 1 capsule (0.5 mcg total) by mouth every Monday, Wednesday, and Friday with hemodialysis.     Cyanocobalamin (VITAMIN B-12 PO) Take 1 capsule by mouth in the morning and at bedtime.     dorzolamide  (TRUSOPT ) 2 % ophthalmic solution Place 1 drop into the right eye at bedtime.     ELIQUIS  2.5 MG TABS tablet Take 1 tablet (2.5 mg total) by mouth 2 (two) times daily. 60 tablet 11   fludrocortisone  (FLORINEF ) 0.1 MG tablet Take 1 tablet (  0.1 mg total) by mouth daily.     folic acid  (FOLVITE ) 1 MG tablet Take 1 tablet (1 mg total) by mouth daily. 30 tablet 0   gabapentin  (NEURONTIN ) 300 MG capsule Take 300 mg by mouth at bedtime.     loperamide  (IMODIUM ) 2 MG capsule Take 1 capsule (2 mg total) by mouth as needed for diarrhea or loose stools. 20 capsule 0   midodrine  (PROAMATINE ) 10 MG tablet Take 2 tablets (20 mg total) by mouth 3 (three) times daily with meals.     mometasone -formoterol  (DULERA ) 200-5 MCG/ACT AERO Inhale  2 puffs into the lungs 2 (two) times daily. 1 each 1   multivitamin (RENA-VIT) TABS tablet Take 1 tablet by mouth daily.     Omega Fatty Acids-Vitamins (OMEGA-3 GUMMIES) CHEW Chew 1 tablet by mouth in the morning and at bedtime.     pantoprazole  (PROTONIX ) 40 MG tablet Take 1 tablet (40 mg total) by mouth 2 (two) times daily. (Patient taking differently: Take 40 mg by mouth daily as needed (indigestion).) 60 tablet 1   ramelteon  (ROZEREM ) 8 MG tablet Take 1 tablet (8 mg total) by mouth at bedtime. (Patient taking differently: Take 8 mg by mouth at bedtime as needed for sleep.) 30 tablet 5   sertraline  (ZOLOFT ) 50 MG tablet Take 25 mg by mouth at bedtime.     sevelamer  carbonate (RENVELA ) 800 MG tablet Take 1,600 mg by mouth 3 (three) times daily with meals.     Sodium Chloride  Flush (SALINE FLUSH) 0.9 % SOLN Use 5 mLs by Intracatheter route daily as directed. 150 mL 1   timolol  (TIMOPTIC ) 0.5 % ophthalmic solution Place 1 drop into both eyes in the morning and at bedtime.     TYLENOL  325 MG tablet Take 325-650 mg by mouth every 6 (six) hours as needed for mild pain (pain score 1-3) or headache.     traMADol  (ULTRAM ) 50 MG tablet Take 1 tablet (50 mg total) by mouth every 8 (eight) hours as needed (for pain). (Patient not taking: Reported on 12/15/2023) 12 tablet 0   No current facility-administered medications for this visit.     Objective:  no self reported vitals Gen: NAD, resting comfortably Lungs: nonlabored, normal respiratory rate  Skin: appears dry, no obvious rash Daughter present with him today and helps with video visit   Assessment and Plan   # Hospital follow-up-not TCM as no phone call completed in outside of 2 weeks -Patient presented to the hospital with syncopal episode-long-term history of chronic orthostasis.  10 pound weight loss in the last year likely contributory as well as ongoing chronic infection and poor overall function - Has been on high-dose midodrine  since prior  to him being my patient-they mention bradycardia, supine hypertension/stroke, QT changes.  He is also on Florinef -they thoroughly discussed uncertainties regarding the midodrine  dosing but ultimately plans to continue these. - There was possible seizure-like activity and neurology was consulted.  MRI of the brain and EEG were unremarkable.  Not considered to be epileptic - Patient remained on Monday Wednesday Friday dialysis during hospitalization - Remains on Eliquis  for anticoagulation from A-fib and was rate controlled without medicine - Chronic ulcerative colitis with ostomy in place noted-patient does need supplies and requests we send these through home care delivered-we received this just the other day - Prior sacral osteomyelitis and sacral fluid collection-had completed prolonged course of ertapenem  and vancomycin -fluid space had collapsed thankfully on most recent imaging.  Consideration of outpatient follow-up after CT  with interventional radiology in 1 to 2 weeks postdischarge-repeat imaging scheduled on June 13 - Abnormal bladder CT-plan was outpatient follow-up with me but we have actually already referred to urology-he agrees to call to schedule this once things settle in his long-term home likely in assisted living is determined - Nephrostomy tube in place  Today he reports: - in rehab- feels like getting strong with plan to go to assisted living but has to get drain removed by IR - they cannot take him with the drain. Scheduled for CT on 12/18/23.  - insurance covers 21 days in rehab and he is on about day 10 -aide hours prior to going into hospital 4 hours- private care and they were the ones but discovered the syncopal episode.  He needs help with food, unloading dishwasher, etc. -Patient's daughter asks if other options are available-suggested could refer to home health with home health aide but frequently due to staffing issues it is difficult to continue this I can start this-I can  also place a separate referral for PT/OT/nursing-they want to hold off for now  # Atrial fibrillation- prior Dr. Ardell Beauvais S: Rate controlled without medicine Anticoagulated with eliquis  2.5 mg BID -consider tsh if we do bloodwork in future A/P: Appropriately anticoagulated.  Rate controlled without medicine.  Was not thought that A-fib contributed to syncopal episode  #ESRD- sees Dr. Edson Graces #Hypotension S: Patient is on dialysis MWF   Medication: Renvela  800 mg-takes 2 tablets 3 times a day.  -Reports starting dialysis around 2021.  Not on transplant list. -Does have a tendency towards low blood pressures with dialysis days-takes midodrine  15 mg 3 times a day previously most recently increased to 20 mg 3 times a day and he is also on Florinef -midodrine  prescribed by Dr. Ardell Beauvais originally A/P: ESRD stable with ongoing dialysis -Hypotension appears to be better managed with midodrine  20 mg 3 times a day-in the hospital he felt like benefits outweigh the risks   # history of ulcerative colitis led to complete colectomy in 2012- has colostomy bag (failed J patch) -also has left nephrostomy as complication of colectomy which led to quicker dialysis per his report (sounds like had CKD IV prior to this). Still urinates from penis as well.  -We are sending in new supplies today  # Depression # Insomnia-on ramelteon  8 mg  S:Medication: Sertraline  25 mg      12/15/2023   10:51 AM 11/17/2023   12:13 PM 05/22/2023    9:13 AM  Depression screen PHQ 2/9  Decreased Interest 0 0 0  Down, Depressed, Hopeless 0 1 1  PHQ - 2 Score 0 1 1  Altered sleeping 0  3  Tired, decreased energy 0  2  Change in appetite 0  0  Feeling bad or failure about yourself  0  0  Trouble concentrating 0  0  Moving slowly or fidgety/restless 0  0  Suicidal thoughts 0  0  PHQ-9 Score 0  6  Difficult doing work/chores Not difficult at all  Somewhat difficult  A/P: Depression in full remission-reasonable sleep on  ramelteon -continue current medications  # Abnormal bladder CT-agrees to call urology once living situation finalized  Recommended follow up: Return for next already scheduled visit or sooner if needed. Future Appointments  Date Time Provider Department Center  12/18/2023 10:20 AM [facility] [place] 1 [facility]  12/18/2023 10:30 AM [facility] [place] [place] FLU[place] 1 [facility]-LBDG D[facility]-LB  12/18/2023 10:30 AM [facility] [place] IR 1 [facility]-LBIR D[facility]-LB  01/21/2024 11:00 AM [facility] 1 [facility]  02/18/2024 10:55 AM Swinyer, Leilani Punter, NP [facility]  02/29/2024  2:20 PM Almira Jaeger, MD [facility] P[facility]  03/01/2024  8:30 AM Maire Scot, MD [facility] None    Lab/Order associations:   ICD-10-CM   1. Orthostatic hypotension  I95.1     2. End-stage renal disease on hemodialysis (HCC)  N18.6    Z99.2     3. Paroxysmal atrial fibrillation (HCC)  I48.0     4. Colostomy status (HCC)  Z93.3     5. Ulcerative colitis with complication, unspecified location (HCC)  K51.919     6. Depression, unspecified depression type  F32.A       No orders of the defined types were placed in this encounter.   Return precautions advised.  Clarisa Crooked, MD

## 2023-12-15 NOTE — Patient Instructions (Addendum)
 No changes today-encouraged staying in rehab for 21 days and trying to transition directly into independent living if possible-if goes home would recommend ongoing support minimum 4 hours a day but even more might be more beneficial  Recommended follow up: Return for next already scheduled visit or sooner if needed.

## 2023-12-18 ENCOUNTER — Other Ambulatory Visit: Payer: Self-pay | Admitting: Internal Medicine

## 2023-12-18 ENCOUNTER — Ambulatory Visit
Admission: RE | Admit: 2023-12-18 | Discharge: 2023-12-18 | Disposition: A | Discharge status: Home / Self Care | Source: Ambulatory Visit | Attending: Internal Medicine | Admitting: Internal Medicine

## 2023-12-18 ENCOUNTER — Ambulatory Visit
Admission: RE | Admit: 2023-12-18 | Discharge: 2023-12-18 | Disposition: A | Discharge status: Home / Self Care | Source: Ambulatory Visit | Attending: Radiology

## 2023-12-18 DIAGNOSIS — R188 Other ascites: Secondary | ICD-10-CM

## 2023-12-18 MED ORDER — LIDOCAINE-EPINEPHRINE 1 %-1:100000 IJ SOLN
10.0000 mL | Freq: Once | INTRAMUSCULAR | Status: AC
  Administered 2023-12-18: 5 mL via INTRADERMAL

## 2023-12-18 MED ORDER — IOPAMIDOL (ISOVUE-300) INJECTION 61%
10.0000 mL | Freq: Once | INTRAVENOUS | Status: AC | PRN
  Administered 2023-12-18: 10 mL

## 2023-12-18 MED ORDER — IOPAMIDOL (ISOVUE-300) INJECTION 61%
100.0000 mL | Freq: Once | INTRAVENOUS | Status: AC | PRN
  Administered 2023-12-18: 100 mL via INTRAVENOUS

## 2023-12-18 NOTE — Progress Notes (Signed)
 Referring Physician(s): Dr. Orlie Bjornstad  Chief Complaint: The patient is seen in follow up today s/p pelvic fluid collection  History of present illness: Joshua Riley is a 88 y.o. male with history of ESRD-on hemodialysis, depression, obstructive uropathy-s/p left PCN placement, ulcerative colitis-s/p colectomy with ileostomy, DVT, anemia and chronic pelvic fluid collection-previous drain placed by IR 07/13/23 and fell out 08/2023. Patient underwent left transgluteal drain placement with Dr. Mabel Savage 11/11/23 due to a chronic pre sacral fluid collection. Culture at the time of drain replacement was positive for E coli and enterococcus.  He has completed a course of IV abx.   Upon assessment today, patient drain remains in place.  He has been flushing at rehab twice daily.  He continues to have murky, cloudy fluid of approx 25 mL daily.  He is accompanied by his daughter today who assists in providing history.    Past Medical History:  Diagnosis Date   A-fib (HCC)    Anemia    Arthritis    Cancer (HCC)    Basal cell   COVID-19    2021   Dysrhythmia    Afib-controlled on eliquis    ESRD (end stage renal disease) (HCC) 10/22/2021   Glaucoma 11/18/2021   History of DVT (deep vein thrombosis)    Hydronephrosis    managed wtih a PCN   Idiopathic neuropathy 10/22/2021   lyrica     Ileostomy in place [facility])    Obstructive uropathy    With chronic left nephrostomy   Old retinal detachment, total or subtotal    Orthostatic hypotension 10/22/2021   Sleep apnea    does not need a machine   Stroke (HCC)    Ulcerative colitis (HCC)    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    Past Surgical History:  Procedure Laterality Date   BASAL CELL CARCINOMA EXCISION     10/23   COLON SURGERY     creation of j pouch     and subsequent takedown of j pouch   EYE SURGERY     IR NEPHROSTOMY EXCHANGE LEFT  12/10/2021   IR NEPHROSTOMY EXCHANGE LEFT  04/22/2022   IR NEPHROSTOMY  EXCHANGE LEFT  07/29/2022   IR NEPHROSTOMY EXCHANGE LEFT  10/28/2022   IR NEPHROSTOMY EXCHANGE LEFT  02/05/2023   IR NEPHROSTOMY EXCHANGE LEFT  05/07/2023   IR NEPHROSTOMY EXCHANGE LEFT  06/25/2023   IR NEPHROSTOMY EXCHANGE LEFT  07/17/2023   IR NEPHROSTOMY EXCHANGE LEFT  10/22/2023   IR RADIOLOGIST EVAL & MGMT  11/25/2023   LEFT HEART CATH AND CORONARY ANGIOGRAPHY N/A 07/22/2022   Procedure: LEFT HEART CATH AND CORONARY ANGIOGRAPHY;  Surgeon: Swaziland, Peter M, MD;  Location: [facility] INVASIVE CV LAB;  Service: Cardiovascular;  Laterality: N/A;   REVISION OF ARTERIOVENOUS GORETEX GRAFT Left 05/06/2022   Procedure: REDO LEFT THIGH ARTERIOVENOUS 4-7 MM GORETEX GRAFT;  Surgeon: Dannis Dy, MD;  Location: [facility] OR;  Service: Vascular;  Laterality: Left;   SMALL INTESTINE SURGERY     TOTAL COLECTOMY      Allergies: Baclofen, Cephalosporins, and Ciprofloxacin  Medications: Prior to Admission medications   Medication Sig Start Date End Date Taking? Authorizing Provider  albuterol  (PROVENTIL ) (2.5 MG/3ML) 0.083% nebulizer solution Take 2.5 mg by nebulization every 6 (six) hours as needed for wheezing or shortness of breath.    [provider]  albuterol  (VENTOLIN  HFA) 108 (90 Base) MCG/ACT inhaler Inhale 1-2 puffs into the lungs every 6 (six) hours as needed  for wheezing or shortness of breath.    [provider]  atorvastatin  (LIPITOR) 20 MG tablet Take 1 tablet (20 mg total) by mouth daily. 11/14/23   Armenta Landau, MD  budesonide  (PULMICORT ) 0.5 MG/2ML nebulizer solution Take 0.5 mg by nebulization 2 (two) times daily as needed (for respiratory flares).    [provider]  calcitRIOL  (ROCALTROL ) 0.5 MCG capsule Take 1 capsule (0.5 mcg total) by mouth every Monday, Wednesday, and Friday with hemodialysis. 12/04/23   Danford, Willis Harter, MD  Cyanocobalamin (VITAMIN B-12 PO) Take 1 capsule by mouth in the morning and at bedtime.    [provider]  dorzolamide   (TRUSOPT ) 2 % ophthalmic solution Place 1 drop into the right eye at bedtime. 06/17/23   [provider]  ELIQUIS  2.5 MG TABS tablet Take 1 tablet (2.5 mg total) by mouth 2 (two) times daily. 08/07/22   Sonny Dust, MD  fludrocortisone  (FLORINEF ) 0.1 MG tablet Take 1 tablet (0.1 mg total) by mouth daily. 12/04/23   Danford, Willis Harter, MD  folic acid  (FOLVITE ) 1 MG tablet Take 1 tablet (1 mg total) by mouth daily. 07/21/23   Regalado, Belkys A, MD  gabapentin  (NEURONTIN ) 300 MG capsule Take 300 mg by mouth at bedtime. 09/09/23   [provider]  loperamide  (IMODIUM ) 2 MG capsule Take 1 capsule (2 mg total) by mouth as needed for diarrhea or loose stools. 11/13/23   Armenta Landau, MD  midodrine  (PROAMATINE ) 10 MG tablet Take 2 tablets (20 mg total) by mouth 3 (three) times daily with meals. 12/03/23   Danford, Willis Harter, MD  mometasone -formoterol  (DULERA ) 200-5 MCG/ACT AERO Inhale 2 puffs into the lungs 2 (two) times daily. 09/08/23   Fonnie Iba I, MD  multivitamin (RENA-VIT) TABS tablet Take 1 tablet by mouth daily.    [provider]  Omega Fatty Acids-Vitamins (OMEGA-3 GUMMIES) CHEW Chew 1 tablet by mouth in the morning and at bedtime.    [provider]  pantoprazole  (PROTONIX ) 40 MG tablet Take 1 tablet (40 mg total) by mouth 2 (two) times daily. Patient taking differently: Take 40 mg by mouth daily as needed (indigestion). 11/13/23   Armenta Landau, MD  ramelteon  (ROZEREM ) 8 MG tablet Take 1 tablet (8 mg total) by mouth at bedtime. Patient taking differently: Take 8 mg by mouth at bedtime as needed for sleep. 10/27/23   Almira Jaeger, MD  sertraline  (ZOLOFT ) 50 MG tablet Take 25 mg by mouth at bedtime.    [provider]  sevelamer  carbonate (RENVELA ) 800 MG tablet Take 1,600 mg by mouth 3 (three) times daily with meals.    [provider]  Sodium Chloride  Flush (SALINE FLUSH) 0.9 % SOLN Use 5 mLs by Intracatheter route  daily as directed. 11/25/23   Caperilla, Marissa N, PA  timolol  (TIMOPTIC ) 0.5 % ophthalmic solution Place 1 drop into both eyes in the morning and at bedtime.    [provider]  traMADol  (ULTRAM ) 50 MG tablet Take 1 tablet (50 mg total) by mouth every 8 (eight) hours as needed (for pain). Patient not taking: Reported on 12/15/2023 12/03/23   Ephriam Hashimoto, MD  TYLENOL  325 MG tablet Take 325-650 mg by mouth every 6 (six) hours as needed for mild pain (pain score 1-3) or headache.    [provider]     Family History  Problem Relation Age of Onset   Stroke Mother    Cancer Father    Esophageal  cancer Brother     Social History   Socioeconomic History   Marital status: Widowed    Spouse name: Not on file   Number of children: Not on file   Years of education: Not on file   Highest education level: 12th grade  Occupational History   Not on file  Tobacco Use   Smoking status: Former    Current packs/day: 0.00    Average packs/day: 2.0 packs/day for 6.0 years (12.0 ttl pk-yrs)    Types: Cigarettes    Start date: 12    Quit date: 28    Years since quitting: 40.4    Passive exposure: Never   Smokeless tobacco: Never  Vaping Use   Vaping status: Never Used  Substance and Sexual Activity   Alcohol use: Yes    Alcohol/week: 5.0 standard drinks of alcohol    Types: 5 Shots of liquor per week    Comment: socially   Drug use: Never   Sexual activity: Not Currently  Other Topics Concern   Not on file  Social History Narrative   Widowed- lost wife of 13 years to pancreatic cancer- previously married 43 years. Son in [place] and daughter helping in [place]. 4 grandkids.       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       Hobbies: dinner with family- occasional martini   Social Drivers of Health   Financial Resource Strain: Low Risk  (10/26/2023)   Overall Financial Resource Strain (CARDIA)    Difficulty of Paying Living Expenses:  Not hard at all  Food Insecurity: No Food Insecurity (11/28/2023)   Hunger Vital Sign    Worried About Running Out of Food in the Last Year: Never true    Ran Out of Food in the Last Year: Never true  Transportation Needs: No Transportation Needs (11/28/2023)   PRAPARE - Administrator, Civil Service (Medical): No    Lack of Transportation (Non-Medical): No  Physical Activity: Insufficiently Active (10/26/2023)   Exercise Vital Sign    Days of Exercise per Week: 1 day    Minutes of Exercise per Session: 10 min  Stress: Stress Concern Present (10/26/2023)   [employer] of Occupational Health - Occupational Stress Questionnaire    Feeling of Stress : To some extent  Social Connections: Socially Isolated (11/28/2023)   Social Connection and Isolation Panel    Frequency of Communication with Friends and Family: More than three times a week    Frequency of Social Gatherings with Friends and Family: More than three times a week    Attends Religious Services: Never    Database administrator or Organizations: No    Attends Banker Meetings: Never    Marital Status: Widowed     Vital Signs: There were no vitals taken for this visit.  Physical Exam  Imaging: No results found.  Labs:  CBC: Recent Labs    11/29/23 0230 11/30/23 0500 12/01/23 0500 12/02/23 0428  WBC 8.1 8.2 6.7 6.4  HGB 7.5* 7.7* 7.4* 7.7*  HCT 23.4* 23.7* 22.5* 22.9*  PLT 171 177 167 193    COAGS: Recent Labs    07/13/23 0658 07/16/23 0630 11/09/23 1804 11/11/23 0005 11/11/23 0706 11/11/23 2041 11/12/23 0900 11/27/23 1412  INR 1.1  --   --  1.3*  --   --   --  1.1  APTT 44*   < > 42*  --  35 35  64*  --    < > = values in this interval not displayed.    BMP: Recent Labs    11/29/23 0230 11/30/23 0500 12/01/23 0500 12/02/23 0428  NA 139 139 137 136  K 3.8 3.7 4.1 4.9  CL 101 99 97* 101  CO2 26 24 28 26   GLUCOSE 92 128* 113* 92  BUN 21 39* 27* 44*  CALCIUM  8.7*  8.6* 8.8* 8.8*  CREATININE 6.14* 8.06* 5.24* 7.05*  GFRNONAA 8* 6* 10* 7*    LIVER FUNCTION TESTS: Recent Labs    09/07/23 0609 09/08/23 1948 11/09/23 1804 11/10/23 1148 11/27/23 1412 11/29/23 0230 11/30/23 0500 12/01/23 0500 12/02/23 0428  BILITOT 1.0  --  1.1  --  0.8 1.1  --   --   --   AST 16  --  16  --  25 23  --   --   --   ALT 15  --  32  --  15 16  --   --   --   ALKPHOS 96  --  77  --  69 56  --   --   --   PROT 7.1  --  6.5  --  6.6 5.8*  --   --   --   ALBUMIN  3.7   < > 2.6*   < > 3.3* 3.1* 3.0* 3.0* 3.0*   < > = values in this interval not displayed.    Assessment: Pelvic fluid collection s/p multiple drain placements, most recent being L TG drain by Dr. Mabel Savage 11/11/23. Patient returns to drain clinic for assessment of his drain.  CT Abd Pelv reviewed by Dr. Jinx Mourning who notes stable collection.  Drain injection performed shows fistulous connection to the rectal stump.  Discussed with patient and his family.  Plan made to exchange and downsize the current drain.  Drain exchange performed by Dr. Jinx Mourning and dictated separately.  Patient connected to gravity drainage.  No flushes for now.  Routine site care as needed to keep clean and dry.   Follow-up expected in 3-4 weeks with repeat CT and injection.   Signed: Temari Schooler Sue-Ellen Kaylanie Capili, PA 12/18/2023, 1:17 PM   Please refer to Dr. Jinx Mourning attestation of this note for management and plan.

## 2023-12-22 ENCOUNTER — Telehealth: Payer: Self-pay | Admitting: Family Medicine

## 2023-12-22 MED ORDER — MIDODRINE HCL 10 MG PO TABS: 20.0000 mg | ORAL_TABLET | Freq: Three times a day (TID) | ORAL | Status: DC

## 2023-12-22 NOTE — Telephone Encounter (Signed)
 Copied from CRM [id number]. Topic: Clinical - Medication Refill >> Dec 22, 2023 12:01 PM Martin[place] E wrote: Medication: midodrine  (PROAMATINE ) 10 MG tablet  Has the patient contacted their pharmacy? Yes (Agent: If no, request that the patient contact the pharmacy for the refill. If patient does not wish to contact the pharmacy document the reason why and proceed with request.) (Agent: If yes, when and what did the pharmacy advise?)  This is the patient's preferred pharmacy:  [id number] [name], [place] - [address] [place] [address] Phone: [phone] Fax: [phone]  Is this the correct pharmacy for this prescription? Yes If no, delete pharmacy and type the correct one.   Has the prescription been filled recently? Yes  Is the patient out of the medication? No, a week left.  Has the patient been seen for an appointment in the last year OR does the patient have an upcoming appointment? Yes  Can we respond through MyChart? Yes  Agent: Please be advised that Rx refills may take up to 3 business days. We ask that you follow-up with your pharmacy.

## 2023-12-28 ENCOUNTER — Telehealth: Payer: Self-pay | Admitting: Infectious Disease

## 2023-12-28 NOTE — Telephone Encounter (Signed)
 Joshua Riley of [facility] called regarding Joshua Riley's JP drain. She stated it fell out and she was unsure if she should schedule an appt to have it replace or who needs to advise on the matter. Joshua Riley can be reached at [phone].

## 2023-12-29 ENCOUNTER — Telehealth: Payer: Self-pay | Admitting: Infectious Disease

## 2023-12-29 ENCOUNTER — Other Ambulatory Visit: Payer: Self-pay

## 2023-12-29 DIAGNOSIS — N99528 Other complication of other external stoma of urinary tract: Secondary | ICD-10-CM

## 2023-12-29 NOTE — Telephone Encounter (Signed)
 Multiple attempts to reah Dianne and all three times transferred to her the call drops. Was able to speak with Raguel with [facility] and gave her the number for the JP drain clinic for the them to reach out to them to assist with having the drain replaced.

## 2023-12-29 NOTE — Telephone Encounter (Signed)
 Diane of [facility] called again requesting the next steps after Joshua Riley's JP drain fell out. She will be out of the office shortly but requested someone leave a message with the front staff if the pt needs and appt for replacement or her next steps. Diane can be reached at [phone]

## 2023-12-30 ENCOUNTER — Ambulatory Visit (HOSPITAL_COMMUNITY)
Admission: RE | Admit: 2023-12-30 | Discharge: 2023-12-30 | Disposition: A | Discharge status: Home / Self Care | Source: Ambulatory Visit

## 2023-12-30 ENCOUNTER — Ambulatory Visit (HOSPITAL_COMMUNITY)
Admission: RE | Admit: 2023-12-30 | Discharge: 2023-12-30 | Disposition: A | Discharge status: Home / Self Care | Source: Ambulatory Visit | Attending: Internal Medicine

## 2023-12-30 ENCOUNTER — Other Ambulatory Visit: Payer: Self-pay

## 2023-12-30 DIAGNOSIS — Z9049 Acquired absence of other specified parts of digestive tract: Secondary | ICD-10-CM | POA: Insufficient documentation

## 2023-12-30 DIAGNOSIS — I700 Atherosclerosis of aorta: Secondary | ICD-10-CM | POA: Diagnosis not present

## 2023-12-30 DIAGNOSIS — Z932 Ileostomy status: Secondary | ICD-10-CM | POA: Diagnosis not present

## 2023-12-30 DIAGNOSIS — R188 Other ascites: Secondary | ICD-10-CM

## 2023-12-30 DIAGNOSIS — K8020 Calculus of gallbladder without cholecystitis without obstruction: Secondary | ICD-10-CM | POA: Diagnosis not present

## 2023-12-30 DIAGNOSIS — N261 Atrophy of kidney (terminal): Secondary | ICD-10-CM | POA: Diagnosis not present

## 2023-12-30 DIAGNOSIS — Z87891 Personal history of nicotine dependence: Secondary | ICD-10-CM | POA: Diagnosis not present

## 2023-12-30 MED ORDER — IOHEXOL 350 MG/ML SOLN
75.0000 mL | Freq: Once | INTRAVENOUS | Status: AC | PRN
  Administered 2023-12-30: 75 mL via INTRAVENOUS

## 2023-12-30 NOTE — Progress Notes (Signed)
 Mark RN notified of client taking eliquis  yesterday

## 2023-12-30 NOTE — Progress Notes (Signed)
 Chief Complaint: Displaced drainage tube  Supervising Physician: [name] Hacker  Patient Status: [facility] - Out-pt  History of Present Illness: Joshua Riley is a 88 y.o. male with history of ESRD-on hemodialysis, depression, obstructive uropathy-s/p left PCN placement, ulcerative colitis-s/p colectomy with ileostomy, DVT, anemia and chronic pelvic fluid collection-previous drain placed by IR 07/13/23 and fell out 08/2023.  Patient underwent left transgluteal drain placement with Dr. Alona 11/11/23 due to a chronic pre sacral fluid collection. Culture at the time of drain replacement was positive for E coli and enterococcus.    At last drain assessment 12/18/2023 at [facility] clinic, patient reported continued flushing at rehab facility, cloudy output of approximately 25 mL daily.  Drain injection showed fistulization at this visit.  Plan at that time was for patient to return to [facility] clinic on Friday, 01/01/2024 for imaging and drain injection.  Patient also has a nephrostomy tube originally placed in [place] , has been exchanged by [facility] IR since 2023.  In the interim of appointment at [facility] on Friday, received a call from patient's family stating that nephrostomy tube had been pulled out.  Called [facility], was not able to reach [employer] staff member who was aware of the problem.  Spoke to another staff member who stated that it was the upper drain in the patient's back, not the patient's gluteus that had been removed.  Called patient's family states that is the nephrostomy that was removed since Saturday.  Spoke to IR MD yesterday who recommends patient come in for attempted tract salvage of nephrostomy.  When patient arrived to short stay it was found that the nephrostomy tube was intact and present, flushes easily, with urine in bag.  The drain that had been displaced was the transgluteal drain.  Briefly discussed the case with Dr. [name] who agrees with plan to move CTA for Friday  up to today.  Patient denies any complaint apart from drainage in underwear from rectum that appears to be purulent output similar to what he was having from his drain.  Denies fever, chills, nausea, vomiting, abdominal pain.  States that he was having minimal output from the drain prior to its accidental removal on Friday.  Allergies Reviewed:  Baclofen, Cephalosporins, and Ciprofloxacin    Patient is Full Code  Past Medical History:  Diagnosis Date   A-fib (HCC)    Anemia    Arthritis    Cancer (HCC)    Basal cell   COVID-19    2021   Dysrhythmia    Afib-controlled on eliquis    ESRD (end stage renal disease) (HCC) 10/22/2021   Glaucoma 11/18/2021   History of DVT (deep vein thrombosis)    Hydronephrosis    managed wtih a PCN   Idiopathic neuropathy 10/22/2021   lyrica     Ileostomy in place [facility])    Obstructive uropathy    With chronic left nephrostomy   Old retinal detachment, total or subtotal    Orthostatic hypotension 10/22/2021   Sleep apnea    does not need a machine   Stroke (HCC)    Ulcerative colitis (HCC)    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    Past Surgical History:  Procedure Laterality Date   BASAL CELL CARCINOMA EXCISION     10/23   COLON SURGERY     creation of j pouch     and subsequent takedown of j pouch   EYE SURGERY     IR CATHETER  TUBE CHANGE  12/18/2023   IR NEPHROSTOMY EXCHANGE LEFT  12/10/2021   IR NEPHROSTOMY EXCHANGE LEFT  04/22/2022   IR NEPHROSTOMY EXCHANGE LEFT  07/29/2022   IR NEPHROSTOMY EXCHANGE LEFT  10/28/2022   IR NEPHROSTOMY EXCHANGE LEFT  02/05/2023   IR NEPHROSTOMY EXCHANGE LEFT  05/07/2023   IR NEPHROSTOMY EXCHANGE LEFT  06/25/2023   IR NEPHROSTOMY EXCHANGE LEFT  07/17/2023   IR NEPHROSTOMY EXCHANGE LEFT  10/22/2023   IR RADIOLOGIST EVAL & MGMT  11/25/2023   IR RADIOLOGIST EVAL & MGMT  12/18/2023   LEFT HEART CATH AND CORONARY ANGIOGRAPHY N/A 07/22/2022   Procedure: LEFT HEART CATH AND CORONARY  ANGIOGRAPHY;  Surgeon: Swaziland, Peter M, MD;  Location: [facility] INVASIVE CV LAB;  Service: Cardiovascular;  Laterality: N/A;   REVISION OF ARTERIOVENOUS GORETEX GRAFT Left 05/06/2022   Procedure: REDO LEFT THIGH ARTERIOVENOUS 4-7 MM GORETEX GRAFT;  Surgeon: Eliza Lonni RAMAN, MD;  Location: [facility] OR;  Service: Vascular;  Laterality: Left;   SMALL INTESTINE SURGERY     TOTAL COLECTOMY        Medications: Prior to Admission medications   Medication Sig Start Date End Date Taking? Authorizing Provider  albuterol  (PROVENTIL ) (2.5 MG/3ML) 0.083% nebulizer solution Take 2.5 mg by nebulization every 6 (six) hours as needed for wheezing or shortness of breath.   Yes [provider]  albuterol  (VENTOLIN  HFA) 108 (90 Base) MCG/ACT inhaler Inhale 1-2 puffs into the lungs every 6 (six) hours as needed for wheezing or shortness of breath.   Yes [provider]  atorvastatin  (LIPITOR) 20 MG tablet Take 1 tablet (20 mg total) by mouth daily. 11/14/23  Yes Sebastian Toribio GAILS, MD  budesonide  (PULMICORT ) 0.5 MG/2ML nebulizer solution Take 0.5 mg by nebulization 2 (two) times daily as needed (for respiratory flares).   Yes [provider]  calcitRIOL  (ROCALTROL ) 0.5 MCG capsule Take 1 capsule (0.5 mcg total) by mouth every Monday, Wednesday, and Friday with hemodialysis. 12/04/23  Yes Danford, Lonni SQUIBB, MD  Cyanocobalamin (VITAMIN B-12 PO) Take 1 capsule by mouth in the morning and at bedtime.   Yes [provider]  ELIQUIS  2.5 MG TABS tablet Take 1 tablet (2.5 mg total) by mouth 2 (two) times daily. 08/07/22  Yes Hobart Powell BRAVO, MD  folic acid  (FOLVITE ) 1 MG tablet Take 1 tablet (1 mg total) by mouth daily. 07/21/23  Yes Regalado, Belkys A, MD  loperamide  (IMODIUM ) 2 MG capsule Take 1 capsule (2 mg total) by mouth as needed for diarrhea or loose stools. 11/13/23  Yes Sebastian Toribio GAILS, MD  midodrine  (PROAMATINE ) 10 MG tablet Take 2 tablets (20 mg total) by mouth 3 (three)  times daily with meals. 12/22/23  Yes Katrinka Garnette KIDD, MD  pantoprazole  (PROTONIX ) 40 MG tablet Take 1 tablet (40 mg total) by mouth 2 (two) times daily. Patient taking differently: Take 40 mg by mouth daily as needed (indigestion). 11/13/23  Yes Sebastian Toribio GAILS, MD  sertraline  (ZOLOFT ) 50 MG tablet Take 25 mg by mouth at bedtime.   Yes [provider]  sevelamer  carbonate (RENVELA ) 800 MG tablet Take 1,600 mg by mouth 3 (three) times daily with meals.   Yes [provider]  traMADol  (ULTRAM ) 50 MG tablet Take 1 tablet (50 mg total) by mouth every 8 (eight) hours as needed (for pain). 12/03/23  Yes Danford, Lonni SQUIBB, MD  TYLENOL  325 MG tablet Take 325-650 mg by mouth every 6 (six) hours as needed for mild pain (pain score 1-3)  or headache.   Yes [provider]  dorzolamide  (TRUSOPT ) 2 % ophthalmic solution Place 1 drop into the right eye at bedtime. 06/17/23   [provider]  fludrocortisone  (FLORINEF ) 0.1 MG tablet Take 1 tablet (0.1 mg total) by mouth daily. 12/04/23   Danford, Lonni SQUIBB, MD  gabapentin  (NEURONTIN ) 300 MG capsule Take 300 mg by mouth at bedtime. 09/09/23   [provider]  mometasone -formoterol  (DULERA ) 200-5 MCG/ACT AERO Inhale 2 puffs into the lungs 2 (two) times daily. 09/08/23   Rosario Eland I, MD  multivitamin (RENA-VIT) TABS tablet Take 1 tablet by mouth daily.    [provider]  Omega Fatty Acids-Vitamins (OMEGA-3 GUMMIES) CHEW Chew 1 tablet by mouth in the morning and at bedtime.    [provider]  ramelteon  (ROZEREM ) 8 MG tablet Take 1 tablet (8 mg total) by mouth at bedtime. Patient taking differently: Take 8 mg by mouth at bedtime as needed for sleep. 10/27/23   Katrinka Garnette KIDD, MD  Sodium Chloride  Flush (SALINE FLUSH) 0.9 % SOLN Use 5 mLs by Intracatheter route daily as directed. 11/25/23   Caperilla, Marissa N, PA  timolol  (TIMOPTIC ) 0.5 % ophthalmic solution Place 1 drop into both eyes in the  morning and at bedtime.    [provider]     Family History  Problem Relation Age of Onset   Stroke Mother    Cancer Father    Esophageal cancer Brother     Social History   Socioeconomic History   Marital status: Widowed    Spouse name: Not on file   Number of children: Not on file   Years of education: Not on file   Highest education level: 12th grade  Occupational History   Not on file  Tobacco Use   Smoking status: Former    Current packs/day: 0.00    Average packs/day: 2.0 packs/day for 6.0 years (12.0 ttl pk-yrs)    Types: Cigarettes    Start date: 18    Quit date: 85    Years since quitting: 40.5    Passive exposure: Never   Smokeless tobacco: Never  Vaping Use   Vaping status: Never Used  Substance and Sexual Activity   Alcohol use: Yes    Alcohol/week: 5.0 standard drinks of alcohol    Types: 5 Shots of liquor per week    Comment: socially   Drug use: Never   Sexual activity: Not Currently  Other Topics Concern   Not on file  Social History Narrative   Widowed- lost wife of 13 years to pancreatic cancer- previously married 43 years. Son in [place] and daughter helping in [place]. 4 grandkids.       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       Hobbies: dinner with family- occasional martini   Social Drivers of Health   Financial Resource Strain: Low Risk  (10/26/2023)   Overall Financial Resource Strain (CARDIA)    Difficulty of Paying Living Expenses: Not hard at all  Food Insecurity: No Food Insecurity (11/28/2023)   Hunger Vital Sign    Worried About Running Out of Food in the Last Year: Never true    Ran Out of Food in the Last Year: Never true  Transportation Needs: No Transportation Needs (11/28/2023)   PRAPARE - Administrator, Civil Service (Medical): No    Lack of Transportation (Non-Medical): No  Physical Activity: Insufficiently Active (10/26/2023)   Exercise Vital  Sign    Days of Exercise per  Week: 1 day    Minutes of Exercise per Session: 10 min  Stress: Stress Concern Present (10/26/2023)   [employer] of Occupational Health - Occupational Stress Questionnaire    Feeling of Stress : To some extent  Social Connections: Socially Isolated (11/28/2023)   Social Connection and Isolation Panel    Frequency of Communication with Friends and Family: More than three times a week    Frequency of Social Gatherings with Friends and Family: More than three times a week    Attends Religious Services: Never    Database administrator or Organizations: No    Attends Banker Meetings: Never    Marital Status: Widowed     Review of Systems: A 12 point ROS discussed and pertinent positives are indicated in the HPI above.  All other systems are negative.   Vital Signs: BP 92/60   Pulse 81   Temp 97.7 F (36.5 C) (Oral)   Resp 17   Ht 5' 8 (1.727 m)   Wt 155 lb (70.3 kg)   SpO2 99%   BMI 23.57 kg/m      Physical Exam Constitutional:      Appearance: Normal appearance.  HENT:     Mouth/Throat:     Mouth: Mucous membranes are moist.     Pharynx: Oropharynx is clear.   Cardiovascular:     Rate and Rhythm: Normal rate.     Pulses: Normal pulses.     Heart sounds: Normal heart sounds.  Pulmonary:     Effort: Pulmonary effort is normal.     Breath sounds: Normal breath sounds.  Abdominal:     Palpations: Abdomen is soft.  Genitourinary:    Comments: L PCN present with cloudy yellow urine in bag  Scabbed area present L gluteal area without drainage on dressing. Nontender, not fluctuant around drain  Skin:    General: Skin is warm and dry.   Neurological:     Mental Status: He is alert and oriented to person, place, and time.   Psychiatric:        Mood and Affect: Mood normal.        Behavior: Behavior normal.        Thought Content: Thought content normal.        Judgment: Judgment normal.     Imaging: CT ABDOMEN PELVIS W CONTRAST Result  Date: 12/20/2023 CLINICAL DATA:  Follow-up pelvic abscess status post percutaneous catheter drainage. EXAM: CT ABDOMEN AND PELVIS WITH CONTRAST TECHNIQUE: Multidetector CT imaging of the abdomen and pelvis was performed using the standard protocol following bolus administration of intravenous contrast. RADIATION DOSE REDUCTION: This exam was performed according to the departmental dose-optimization program which includes automated exposure control, adjustment of the mA and/or kV according to patient size and/or use of iterative reconstruction technique. CONTRAST:  100mL ISOVUE -300 IOPAMIDOL  (ISOVUE -300) INJECTION 61% COMPARISON:  Pelvic CT on 11/27/2022 FINDINGS: Lower Chest: No acute findings. Hepatobiliary: No suspicious hepatic masses identified. 2 cm calcified gallstone again noted, without signs of cholecystitis or biliary ductal dilatation. Pancreas:  No mass or inflammatory changes. Spleen: Within normal limits in size and appearance. Adrenals/Urinary Tract: Severe bilateral diffuse renal parenchymal atrophy demonstrated. No suspicious masses identified. Left percutaneous nephrostomy tube is seen in appropriate position, without hydronephrosis. Decreased bladder wall thickening since previous study, which may be due to chronic bladder outlet obstruction or cystitis. Stomach/Bowel: Prior total colectomy with end ileostomy in the right lower  quadrant. A small parastomal hernia is seen containing a single small bowel loop. No No evidence of bowel obstruction or strangulation. Left-sided transgluteal drainage catheter is again seen in the presacral space surrounded by soft tissue density which is most likely inflammatory in etiology. No undrained fluid collections identified. Vascular/Lymphatic: No pathologically enlarged lymph nodes. No acute vascular findings. Patent left common femoral artery and vein grafts seen extending down the left side. Reproductive: Stable mildly enlarged prostate. Penile prosthesis  remains in place, with reservoir in right suprapubic region. Other:  None. Musculoskeletal: No suspicious bone lesions identified. Bilateral L5 pars defects again noted, with grade 1 anterolisthesis and degenerative disc disease at L5-S1. IMPRESSION: Left transgluteal drainage catheter remains in the presacral space surrounded by soft tissue density which is likely inflammatory in etiology. No undrained fluid collections identified. Prior total colectomy with end ileostomy in right lower quadrant. Small parastomal hernia containing a single small bowel loop, without signs of bowel obstruction or strangulation. Cholelithiasis. No radiographic evidence of cholecystitis. Stable mildly enlarged prostate. Decreased bladder wall thickening, which may be due to chronic bladder outlet obstruction or cystitis. Severe bilateral renal parenchymal atrophy. Left percutaneous nephrostomy tube in appropriate position, without hydronephrosis. Electronically Signed   By: Norleen DELENA Kil M.D.   On: 12/20/2023 10:08   IR Catheter Tube Change Result Date: 12/18/2023 CLINICAL DATA:  88 year old male with history of recurrent presacral abscess status post percutaneous drain placement on 11/11/2023. EXAM: IR CATHETER TUBE CHANGE COMPARISON:  11/11/2023 CONTRAST:  10 mL Isovue -300-administered via the percutaneous drainage catheter. MEDICATIONS: None. ANESTHESIA/SEDATION: None FLUOROSCOPY TIME:  22.2 mGy reference air kerma TECHNIQUE: Patient was positioned prone on the fluoroscopy table. The external portion of the existing percutaneous drainage catheter as well as the surrounding skin was prepped and draped in usual sterile fashion. A preprocedural spot fluoroscopic image was obtained of the existing percutaneous drainage catheter. A small amount of contrast was injected via the existing percutaneous drainage catheter and several fluoroscopic images were obtained in various obliquities. The external portion of the percutaneous drainage  catheter was cut and cannulated with a short Amplatz wire. Under intermittent fluoroscopic guidance, the existing percutaneous drainage catheter was exchanged for a new 8 [place] mini loop percutaneous drainage catheter with end coiled and locked within the decompressed abscess cavity. Contrast injection confirmed appropriate position functionality of the percutaneous drainage catheter. The percutaneous drainage catheter was connected to a drainage back and secured in place within interrupted suture and a StatLock device. A dressing was applied. The patient tolerated the procedure well without immediate postprocedural complication. 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 IMPRESSION: 1. 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 2. Technically successful downsize from a 10 [place] multipurpose drain to an 8 [place] mini loop. PLAN: Discontinue flushing and keep to bag drainage. Follow-up in 3-4 weeks for drain evaluation with CT and drain injection. Ester Sides, MD [facility] Electronically Signed   By: Ester Sides M.D.   On: 12/18/2023 14:27   IR Radiologist Eval & Mgmt Result Date: 12/18/2023 EXAM: ESTABLISHED PATIENT OFFICE VISIT CHIEF COMPLAINT: See Epic note. HISTORY OF PRESENT ILLNESS: See Epic note. REVIEW OF SYSTEMS: See Epic note. PHYSICAL EXAMINATION: See Epic note. ASSESSMENT AND PLAN: See Epic note. Ester Sides, MD [facility] Electronically Signed   By: Ester Sides M.D.   On: 12/18/2023 14:23    Labs:  CBC:  Recent Labs    11/29/23 0230 11/30/23 0500 12/01/23 0500 12/02/23 0428  WBC 8.1 8.2 6.7 6.4  HGB 7.5* 7.7* 7.4* 7.7*  HCT 23.4* 23.7* 22.5* 22.9*  PLT 171 177 167 193    COAGS: Recent Labs    07/13/23 0658 07/16/23 0630  11/09/23 1804 11/11/23 0005 11/11/23 0706 11/11/23 2041 11/12/23 0900 11/27/23 1412  INR 1.1  --   --  1.3*  --   --   --  1.1  APTT 44*   < > 42*  --  35 35 64*  --    < > = values in this interval not displayed.    BMP: Recent Labs    11/29/23 0230 11/30/23 0500 12/01/23 0500 12/02/23 0428  NA 139 139 137 136  K 3.8 3.7 4.1 4.9  CL 101 99 97* 101  CO2 26 24 28 26   GLUCOSE 92 128* 113* 92  BUN 21 39* 27* 44*  CALCIUM  8.7* 8.6* 8.8* 8.8*  CREATININE 6.14* 8.06* 5.24* 7.05*  GFRNONAA 8* 6* 10* 7*    LIVER FUNCTION TESTS: Recent Labs    09/07/23 0609 09/08/23 1948 11/09/23 1804 11/10/23 1148 11/27/23 1412 11/29/23 0230 11/30/23 0500 12/01/23 0500 12/02/23 0428  BILITOT 1.0  --  1.1  --  0.8 1.1  --   --   --   AST 16  --  16  --  25 23  --   --   --   ALT 15  --  32  --  15 16  --   --   --   ALKPHOS 96  --  77  --  69 56  --   --   --   PROT 7.1  --  6.5  --  6.6 5.8*  --   --   --   ALBUMIN  3.7   < > 2.6*   < > 3.3* 3.1* 3.0* 3.0* 3.0*   < > = values in this interval not displayed.    TUMOR MARKERS: No results for input(s): AFPTM, CEA, CA199, CHROMGRNA in the last 8760 hours.  Assessment and Plan:  Coordinated with CT staff for patient to receive CTA while at [facility] today to assess pelvic fluid collection. Image reviewed by Dr. [name]. Fluid collection still present. Given the chronicity and fistulization of fluid collection to bowel, IR recommends replacing drain. Patient's family will be contacted in AM to discuss further. If agreeable, will set up follow up appt with IR for replacement.    Thank you for allowing our service to participate in Joshua Riley 's care.    Electronically Signed: Laymon Coast, NP   12/30/2023, 3:47 PM     I spent a total of    40 Minutes in face to face in clinical consultation, greater than 50% of which was counseling/coordinating care for displaced pelvic fluid collection drain.    (A copy of  this note was sent to the referring provider and the time of visit.)

## 2023-12-31 ENCOUNTER — Other Ambulatory Visit: Payer: Self-pay

## 2023-12-31 ENCOUNTER — Telehealth: Payer: Self-pay | Admitting: *Deleted

## 2023-12-31 DIAGNOSIS — K651 Peritoneal abscess: Secondary | ICD-10-CM

## 2023-12-31 NOTE — Telephone Encounter (Signed)
   Pre-operative Risk Assessment    Patient Name: Joshua Riley  DOB: 09-Jul-1935 MRN: [id number]   Date of last office visit: 09/03/23 ROSALINE BANE, NP Date of next office visit: 02/18/24 ROSALINE BANE, NP   Request for Surgical Clearance    Procedure:  CT GUIDED NEEDLE CATH PLACEMENT   Date of Surgery:  Clearance TBD                                Surgeon:  NOT LISTED Surgeon's Group or Practice Name:  [facility]  Phone number:  [phone] Fax number:  [phone] ATTN: MELISSA X.    Type of Clearance Requested:   - Pharmacy:  Hold Apixaban  (Eliquis ) x 48 HOURS PRIOR ( DID CONFIRM WITH REQUESTING OFFICE THEY ARE ONLY NEEDED PHARMACY CLEARANCE)   Type of Anesthesia:  Not Indicated   Additional requests/questions:    Bonney Niels Jest   12/31/2023, 2:35 PM

## 2024-01-01 ENCOUNTER — Other Ambulatory Visit

## 2024-01-01 ENCOUNTER — Other Ambulatory Visit: Payer: Self-pay

## 2024-01-01 DIAGNOSIS — K651 Peritoneal abscess: Secondary | ICD-10-CM

## 2024-01-01 NOTE — Telephone Encounter (Signed)
 Pharmacy please advise on holding Eliquis  prior to CT GUIDED NEEDLE CATH PLACEMENT  scheduled for TBD. Thank you.

## 2024-01-04 NOTE — Telephone Encounter (Signed)
   Patient Name: Joshua Riley  DOB: 1935/12/23 MRN: [id number]  Primary Cardiologist: Lynwood Schilling, MD  Clinical pharmacists have reviewed the patient's past medical history, labs, and current medications as part of preoperative protocol coverage. The following recommendations have been made:  Per office protocol, patient can hold Eliquis  for 2 days prior to procedure.   Patient will not need bridging with Lovenox (enoxaparin) around procedure.  I will route this recommendation to the requesting party via Epic fax function and remove from pre-op pool.  Please call with questions.  Joshua Riley, Joshua Shove, NP 01/04/2024, 1:22 PM

## 2024-01-04 NOTE — Telephone Encounter (Signed)
 Patient with diagnosis of atrial fibrillation on Eliquis  for anticoagulation.    Procedure:  CT GUIDED NEEDLE CATH PLACEMENT    Date of Surgery:  Clearance TBD   CHA2DS2-VASc Score = 5   This indicates a 7.2% annual risk of stroke. The patient's score is based upon: CHF History: 0 HTN History: 0 Diabetes History: 0 Stroke History: 2 Vascular Disease History: 1 (aortic atherosclerosis noted on CT) Age Score: 2 Gender Score: 0   Patient also has hx of DVT 03/2019  CrCl On dialysis Platelet count 193  Patient has not had an Afib/aflutter ablation within the last 3 months or DCCV within the last 30 days  Per office protocol, patient can hold Eliquis  for 2 days prior to procedure.   Patient will not need bridging with Lovenox (enoxaparin) around procedure.  **This guidance is not considered finalized until pre-operative APP has relayed final recommendations.**

## 2024-01-05 ENCOUNTER — Other Ambulatory Visit: Payer: Self-pay

## 2024-01-05 DIAGNOSIS — K651 Peritoneal abscess: Secondary | ICD-10-CM

## 2024-01-06 ENCOUNTER — Other Ambulatory Visit: Payer: Self-pay | Admitting: Student

## 2024-01-06 DIAGNOSIS — K9189 Other postprocedural complications and disorders of digestive system: Secondary | ICD-10-CM

## 2024-01-07 ENCOUNTER — Ambulatory Visit

## 2024-01-07 ENCOUNTER — Other Ambulatory Visit: Payer: Self-pay

## 2024-01-07 ENCOUNTER — Ambulatory Visit: Admission: RE | Admit: 2024-01-07 | Source: Ambulatory Visit

## 2024-01-07 ENCOUNTER — Ambulatory Visit
Admission: RE | Admit: 2024-01-07 | Discharge: 2024-01-07 | Disposition: A | Discharge status: Home / Self Care | Source: Ambulatory Visit

## 2024-01-07 DIAGNOSIS — K651 Peritoneal abscess: Secondary | ICD-10-CM | POA: Insufficient documentation

## 2024-01-07 DIAGNOSIS — K9189 Other postprocedural complications and disorders of digestive system: Secondary | ICD-10-CM | POA: Diagnosis present

## 2024-01-07 LAB — CBC
HCT: 29.1 % — ABNORMAL LOW (ref 39.0–52.0)
Hemoglobin: 9.2 g/dL — ABNORMAL LOW (ref 13.0–17.0)
MCH: 38 pg — ABNORMAL HIGH (ref 26.0–34.0)
MCHC: 31.6 g/dL (ref 30.0–36.0)
MCV: 120.2 fL — ABNORMAL HIGH (ref 80.0–100.0)
Platelets: 235 10*3/uL (ref 150–400)
RBC: 2.42 MIL/uL — ABNORMAL LOW (ref 4.22–5.81)
RDW: 16.1 % — ABNORMAL HIGH (ref 11.5–15.5)
WBC: 11.2 10*3/uL — ABNORMAL HIGH (ref 4.0–10.5)
nRBC: 0 % (ref 0.0–0.2)

## 2024-01-07 LAB — PROTIME-INR
INR: 1.3 — ABNORMAL HIGH (ref 0.8–1.2)
Prothrombin Time: 17.1 s — ABNORMAL HIGH (ref 11.4–15.2)

## 2024-01-07 MED ORDER — FENTANYL CITRATE (PF) 100 MCG/2ML IJ SOLN
INTRAMUSCULAR | Status: AC
  Filled 2024-01-07: qty 4

## 2024-01-07 MED ORDER — SODIUM CHLORIDE 0.9% FLUSH: 5.0000 mL | Freq: Three times a day (TID) | INTRAVENOUS | Status: DC

## 2024-01-07 MED ORDER — SODIUM CHLORIDE 0.9 % IV BOLUS
INTRAVENOUS | Status: AC | PRN
  Administered 2024-01-07: 250 mL via INTRAVENOUS

## 2024-01-07 MED ORDER — FENTANYL CITRATE (PF) 100 MCG/2ML IJ SOLN
INTRAMUSCULAR | Status: AC | PRN
  Administered 2024-01-07 (×2): 25 ug via INTRAVENOUS

## 2024-01-07 MED ORDER — MIDAZOLAM HCL 2 MG/2ML IJ SOLN
INTRAMUSCULAR | Status: AC | PRN
  Administered 2024-01-07 (×2): .5 mg via INTRAVENOUS

## 2024-01-07 MED ORDER — SODIUM CHLORIDE 0.9 % IV SOLN: INTRAVENOUS | Status: DC

## 2024-01-07 MED ORDER — MIDAZOLAM HCL 2 MG/2ML IJ SOLN
INTRAMUSCULAR | Status: AC
  Filled 2024-01-07: qty 4

## 2024-01-07 NOTE — Procedures (Signed)
 Pre procedural Dx: pelvic abscess Post procedural Dx: Same  Technically successful CT guided placed of a 8Fr drainage catheter placement into the abscess yielding pus.    A representative aspirated sample was capped and sent to the laboratory for analysis.    EBL: Trace Complications: None immediate  KANDICE Banner, MD Pager #: [phone]

## 2024-01-07 NOTE — Progress Notes (Signed)
 Patient's colostomy bag leaked. This RN assisted pt with emptying bag. Cleaned area around it.

## 2024-01-07 NOTE — H&P (Signed)
 Chief Complaint: Patient was seen in consultation today for pelvic abscess   Procedure: Percutaneous drain placement   Referring Physician(s): Burnard Banter, PA-C (Gen Surg)  Supervising Physician: [name] Hacker  Patient Status: [facility] - Out-pt  History of Present Illness: Joshua Riley is a 88 y.o. male with a history of ESRD on hemodialysis, obstructive uropathy s/p left PCN placement, ulcerative colitis s/p colectomy with ileostomy, and chronic presacral fluid collection who is known to IR from previous PCN placement and transgluteal drain placement. Patient has undergone drain placement in 07/2023 and 11/2023 with both drains becoming unintentionally displaced. At his most recent clinic visit in 12/18/23, drain continued to have substantial output and injection revealed fistulization. He was scheduled to return for follow up a few weeks later, but his drain became dislodged during that period. He returned for CT A/P which showed residual fluid collection. Case and images were reviewed by Dr. KANDICE [name] who the patient for drain replacement today in IR.  He presents today with family for drain replacement. Patient states that he has been having some intermittent pain in the area of his previous drain, but denies any fevers/chills, myalgias, shortness of breath. NPO since midnight. All questions concerns answered at the bedside.   Code Status: Full Code  Past Medical History:  Diagnosis Date   A-fib (HCC)    Anemia    Arthritis    Cancer (HCC)    Basal cell   COVID-19    2021   Dysrhythmia    Afib-controlled on eliquis    ESRD (end stage renal disease) (HCC) 10/22/2021   Glaucoma 11/18/2021   History of DVT (deep vein thrombosis)    Hydronephrosis    managed wtih a PCN   Idiopathic neuropathy 10/22/2021   lyrica     Ileostomy in place [facility])    Obstructive uropathy    With chronic left nephrostomy   Old retinal detachment, total or subtotal    Orthostatic hypotension  10/22/2021   Sleep apnea    does not need a machine   Stroke (HCC)    Ulcerative colitis (HCC)    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    Past Surgical History:  Procedure Laterality Date   BASAL CELL CARCINOMA EXCISION     10/23   COLON SURGERY     creation of j pouch     and subsequent takedown of j pouch   EYE SURGERY     IR CATHETER TUBE CHANGE  12/18/2023   IR NEPHROSTOMY EXCHANGE LEFT  12/10/2021   IR NEPHROSTOMY EXCHANGE LEFT  04/22/2022   IR NEPHROSTOMY EXCHANGE LEFT  07/29/2022   IR NEPHROSTOMY EXCHANGE LEFT  10/28/2022   IR NEPHROSTOMY EXCHANGE LEFT  02/05/2023   IR NEPHROSTOMY EXCHANGE LEFT  05/07/2023   IR NEPHROSTOMY EXCHANGE LEFT  06/25/2023   IR NEPHROSTOMY EXCHANGE LEFT  07/17/2023   IR NEPHROSTOMY EXCHANGE LEFT  10/22/2023   IR RADIOLOGIST EVAL & MGMT  11/25/2023   IR RADIOLOGIST EVAL & MGMT  12/18/2023   LEFT HEART CATH AND CORONARY ANGIOGRAPHY N/A 07/22/2022   Procedure: LEFT HEART CATH AND CORONARY ANGIOGRAPHY;  Surgeon: Swaziland, Peter M, MD;  Location: [facility] INVASIVE CV LAB;  Service: Cardiovascular;  Laterality: N/A;   REVISION OF ARTERIOVENOUS GORETEX GRAFT Left 05/06/2022   Procedure: REDO LEFT THIGH ARTERIOVENOUS 4-7 MM GORETEX GRAFT;  Surgeon: Eliza Lonni RAMAN, MD;  Location: [facility] OR;  Service: Vascular;  Laterality: Left;   SMALL INTESTINE SURGERY  TOTAL COLECTOMY      Allergies: Baclofen, Cephalosporins, and Ciprofloxacin  Medications: Prior to Admission medications   Medication Sig Start Date End Date Taking? Authorizing Provider  albuterol  (VENTOLIN  HFA) 108 (90 Base) MCG/ACT inhaler Inhale 1-2 puffs into the lungs every 6 (six) hours as needed for wheezing or shortness of breath.   Yes [provider]  atorvastatin  (LIPITOR) 20 MG tablet Take 1 tablet (20 mg total) by mouth daily. 11/14/23  Yes Sebastian Toribio GAILS, MD  dorzolamide  (TRUSOPT ) 2 % ophthalmic solution Place 1 drop into the right eye at bedtime. 06/17/23  Yes  [provider]  ELIQUIS  2.5 MG TABS tablet Take 1 tablet (2.5 mg total) by mouth 2 (two) times daily. 08/07/22  Yes Hobart Powell BRAVO, MD  gabapentin  (NEURONTIN ) 300 MG capsule Take 300 mg by mouth at bedtime. 09/09/23  Yes [provider]  midodrine  (PROAMATINE ) 10 MG tablet Take 2 tablets (20 mg total) by mouth 3 (three) times daily with meals. 12/22/23  Yes Katrinka Garnette KIDD, MD  pantoprazole  (PROTONIX ) 40 MG tablet Take 1 tablet (40 mg total) by mouth 2 (two) times daily. Patient taking differently: Take 40 mg by mouth daily as needed (indigestion). 11/13/23  Yes Sebastian Toribio GAILS, MD  ramelteon  (ROZEREM ) 8 MG tablet Take 1 tablet (8 mg total) by mouth at bedtime. Patient taking differently: Take 8 mg by mouth at bedtime as needed for sleep. 10/27/23  Yes Katrinka Garnette KIDD, MD  sertraline  (ZOLOFT ) 50 MG tablet Take 25 mg by mouth at bedtime.   Yes [provider]  sevelamer  carbonate (RENVELA ) 800 MG tablet Take 1,600 mg by mouth 3 (three) times daily with meals.   Yes [provider]  albuterol  (PROVENTIL ) (2.5 MG/3ML) 0.083% nebulizer solution Take 2.5 mg by nebulization every 6 (six) hours as needed for wheezing or shortness of breath.    [provider]  budesonide  (PULMICORT ) 0.5 MG/2ML nebulizer solution Take 0.5 mg by nebulization 2 (two) times daily as needed (for respiratory flares).    [provider]  calcitRIOL  (ROCALTROL ) 0.5 MCG capsule Take 1 capsule (0.5 mcg total) by mouth every Monday, Wednesday, and Friday with hemodialysis. 12/04/23   Danford, Lonni SQUIBB, MD  Cyanocobalamin (VITAMIN B-12 PO) Take 1 capsule by mouth in the morning and at bedtime.    [provider]  fludrocortisone  (FLORINEF ) 0.1 MG tablet Take 1 tablet (0.1 mg total) by mouth daily. 12/04/23   Danford, Lonni SQUIBB, MD  folic acid  (FOLVITE ) 1 MG tablet Take 1 tablet (1 mg total) by mouth daily. 07/21/23   Regalado, Belkys A, MD  loperamide  (IMODIUM )  2 MG capsule Take 1 capsule (2 mg total) by mouth as needed for diarrhea or loose stools. 11/13/23   Sebastian Toribio GAILS, MD  mometasone -formoterol  (DULERA ) 200-5 MCG/ACT AERO Inhale 2 puffs into the lungs 2 (two) times daily. 09/08/23   Rosario Eland I, MD  multivitamin (RENA-VIT) TABS tablet Take 1 tablet by mouth daily.    [provider]  Omega Fatty Acids-Vitamins (OMEGA-3 GUMMIES) CHEW Chew 1 tablet by mouth in the morning and at bedtime.    [provider]  Sodium Chloride  Flush (SALINE FLUSH) 0.9 % SOLN Use 5 mLs by Intracatheter route daily as directed. 11/25/23   Caperilla, Marissa N, PA  timolol  (TIMOPTIC ) 0.5 % ophthalmic solution Place 1 drop into both eyes in the morning and at bedtime.    [provider]  traMADol  (ULTRAM ) 50 MG tablet Take 1 tablet (  50 mg total) by mouth every 8 (eight) hours as needed (for pain). 12/03/23   Danford, Lonni SQUIBB, MD  TYLENOL  325 MG tablet Take 325-650 mg by mouth every 6 (six) hours as needed for mild pain (pain score 1-3) or headache.    [provider]     Family History  Problem Relation Age of Onset   Stroke Mother    Cancer Father    Esophageal cancer Brother     Social History   Socioeconomic History   Marital status: Widowed    Spouse name: Not on file   Number of children: Not on file   Years of education: Not on file   Highest education level: 12th grade  Occupational History   Not on file  Tobacco Use   Smoking status: Former    Current packs/day: 0.00    Average packs/day: 2.0 packs/day for 6.0 years (12.0 ttl pk-yrs)    Types: Cigarettes    Start date: 35    Quit date: 54    Years since quitting: 40.5    Passive exposure: Never   Smokeless tobacco: Never  Vaping Use   Vaping status: Never Used  Substance and Sexual Activity   Alcohol use: Yes    Alcohol/week: 5.0 standard drinks of alcohol    Types: 5 Shots of liquor per week    Comment: socially   Drug use: Never   Sexual  activity: Not Currently  Other Topics Concern   Not on file  Social History Narrative   Widowed- lost wife of 13 years to pancreatic cancer- previously married 43 years. Son in [place] and daughter helping in [place]. 4 grandkids.       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       Hobbies: dinner with family- occasional martini   Social Drivers of Health   Financial Resource Strain: Low Risk  (10/26/2023)   Overall Financial Resource Strain (CARDIA)    Difficulty of Paying Living Expenses: Not hard at all  Food Insecurity: No Food Insecurity (11/28/2023)   Hunger Vital Sign    Worried About Running Out of Food in the Last Year: Never true    Ran Out of Food in the Last Year: Never true  Transportation Needs: No Transportation Needs (11/28/2023)   PRAPARE - Administrator, Civil Service (Medical): No    Lack of Transportation (Non-Medical): No  Physical Activity: Insufficiently Active (10/26/2023)   Exercise Vital Sign    Days of Exercise per Week: 1 day    Minutes of Exercise per Session: 10 min  Stress: Stress Concern Present (10/26/2023)   [employer] of Occupational Health - Occupational Stress Questionnaire    Feeling of Stress : To some extent  Social Connections: Socially Isolated (11/28/2023)   Social Connection and Isolation Panel    Frequency of Communication with Friends and Family: More than three times a week    Frequency of Social Gatherings with Friends and Family: More than three times a week    Attends Religious Services: Never    Database administrator or Organizations: No    Attends Banker Meetings: Never    Marital Status: Widowed    Review of Systems Denies any N/V, chest pain, shortness of breath, fevers/chills. All other ROS negative.  Vital Signs: BP 92/62   Pulse 84   Temp 98.6 F (37 C) (Tympanic)   Resp 16   Ht 5' 8 (1.727  m)   Wt 157 lb 9.6 oz (71.5 kg)   SpO2 98%   BMI 23.96 kg/m    Physical  Exam Vitals reviewed.  Constitutional:      Appearance: Normal appearance.  HENT:     Head: Normocephalic and atraumatic.     Mouth/Throat:     Mouth: Mucous membranes are moist.     Pharynx: Oropharynx is clear.  Cardiovascular:     Rate and Rhythm: Normal rate and regular rhythm.  Pulmonary:     Effort: Pulmonary effort is normal.     Breath sounds: Normal breath sounds.  Abdominal:     General: Abdomen is flat.     Palpations: Abdomen is soft.     Tenderness: There is no abdominal tenderness.     Comments: Ostomy in place; surrounding skin is erythematous, non-tender Left PCN in place w/ minimal output in bag  Musculoskeletal:        General: Normal range of motion.     Cervical back: Normal range of motion.  Skin:    General: Skin is warm and dry.  Neurological:     General: No focal deficit present.     Mental Status: He is alert and oriented to person, place, and time. Mental status is at baseline.  Psychiatric:        Mood and Affect: Mood normal.        Behavior: Behavior normal.        Judgment: Judgment normal.     Imaging: CT ABDOMEN PELVIS W CONTRAST Result Date: 12/30/2023 CLINICAL DATA:  Pelvic fluid collection. EXAM: CT ABDOMEN AND PELVIS WITH CONTRAST TECHNIQUE: Multidetector CT imaging of the abdomen and pelvis was performed using the standard protocol following bolus administration of intravenous contrast. RADIATION DOSE REDUCTION: This exam was performed according to the departmental dose-optimization program which includes automated exposure control, adjustment of the mA and/or kV according to patient size and/or use of iterative reconstruction technique. CONTRAST:  75mL OMNIPAQUE  IOHEXOL  350 MG/ML SOLN COMPARISON:  CT abdomen pelvis dated 12/18/2023. FINDINGS: Lower chest: The visualized lung bases are clear. Partially visualized pericardial effusion measure up to 13 mm in thickness. There is coronary vascular calcification. No intra-abdominal free air or  free fluid. Hepatobiliary: The liver is unremarkable. Mild dilatation. There is a 2.5 cm stone in the gallbladder. No pericholecystic fluid or evidence of acute cholecystitis by CT. Pancreas: Unremarkable. No pancreatic ductal dilatation or surrounding inflammatory changes. Spleen: Normal in size without focal abnormality. Adrenals/Urinary Tract: The adrenal glands unremarkable. Atrophic kidneys. Small bilateral renal cysts. There is no hydronephrosis on either side. Percutaneous nephrostomy with pigtail tip in the left renal inferior pole collecting system. Air within the proximal left ureter introduced via the catheter. The urinary bladder is collapsed. Stomach/Bowel: Postsurgical changes of total colectomy with a right lower quadrant ileostomy. Small parastomal hernia. There is no bowel obstruction or active inflammation. Appendectomy. Vascular/Lymphatic: Moderate aortoiliac atherosclerotic disease. The IVC is unremarkable. No portal venous gas. There is no adenopathy. Reproductive: The prostate and seminal vesicles are grossly unremarkable. Other: Interval removal of the pelvic drainage catheter. A loculated collection in the posterior pelvis/presacral region measures 2.7 x 3.4 cm in greatest axial dimensions approximately 5.5 cm in maximal length on sagittal view. Similar appearance of postsurgical changes of the left groin. A 1.8 x 1.6 cm low attenuating area similar to prior CT and likely a seroma. Penile implant with reservoir in the right anterior hemipelvis. Musculoskeletal: Degenerative changes of the spine. No acute osseous  pathology. Bilateral L5 pars defects with grade 1 anterolisthesis. IMPRESSION: 1. Interval removal of the pelvic drainage catheter. A 2.7 x 3.4 cm residual or reaccumulated collection in the posterior pelvis/presacral region. 2. Postsurgical changes of total colectomy with a right lower quadrant ileostomy. No bowel obstruction. 3. Cholelithiasis. 4. Atrophic kidneys. No  hydronephrosis. Percutaneous nephrostomy with pigtail tip in the left renal inferior pole collecting system. 5.  Aortic Atherosclerosis (ICD10-I70.0). Electronically Signed   By: Vanetta Chou M.D.   On: 12/30/2023 17:07   CT ABDOMEN PELVIS W CONTRAST Result Date: 12/20/2023 CLINICAL DATA:  Follow-up pelvic abscess status post percutaneous catheter drainage. EXAM: CT ABDOMEN AND PELVIS WITH CONTRAST TECHNIQUE: Multidetector CT imaging of the abdomen and pelvis was performed using the standard protocol following bolus administration of intravenous contrast. RADIATION DOSE REDUCTION: This exam was performed according to the departmental dose-optimization program which includes automated exposure control, adjustment of the mA and/or kV according to patient size and/or use of iterative reconstruction technique. CONTRAST:  100mL ISOVUE -300 IOPAMIDOL  (ISOVUE -300) INJECTION 61% COMPARISON:  Pelvic CT on 11/27/2022 FINDINGS: Lower Chest: No acute findings. Hepatobiliary: No suspicious hepatic masses identified. 2 cm calcified gallstone again noted, without signs of cholecystitis or biliary ductal dilatation. Pancreas:  No mass or inflammatory changes. Spleen: Within normal limits in size and appearance. Adrenals/Urinary Tract: Severe bilateral diffuse renal parenchymal atrophy demonstrated. No suspicious masses identified. Left percutaneous nephrostomy tube is seen in appropriate position, without hydronephrosis. Decreased bladder wall thickening since previous study, which may be due to chronic bladder outlet obstruction or cystitis. Stomach/Bowel: Prior total colectomy with end ileostomy in the right lower quadrant. A small parastomal hernia is seen containing a single small bowel loop. No No evidence of bowel obstruction or strangulation. Left-sided transgluteal drainage catheter is again seen in the presacral space surrounded by soft tissue density which is most likely inflammatory in etiology. No undrained fluid  collections identified. Vascular/Lymphatic: No pathologically enlarged lymph nodes. No acute vascular findings. Patent left common femoral artery and vein grafts seen extending down the left side. Reproductive: Stable mildly enlarged prostate. Penile prosthesis remains in place, with reservoir in right suprapubic region. Other:  None. Musculoskeletal: No suspicious bone lesions identified. Bilateral L5 pars defects again noted, with grade 1 anterolisthesis and degenerative disc disease at L5-S1. IMPRESSION: Left transgluteal drainage catheter remains in the presacral space surrounded by soft tissue density which is likely inflammatory in etiology. No undrained fluid collections identified. Prior total colectomy with end ileostomy in right lower quadrant. Small parastomal hernia containing a single small bowel loop, without signs of bowel obstruction or strangulation. Cholelithiasis. No radiographic evidence of cholecystitis. Stable mildly enlarged prostate. Decreased bladder wall thickening, which may be due to chronic bladder outlet obstruction or cystitis. Severe bilateral renal parenchymal atrophy. Left percutaneous nephrostomy tube in appropriate position, without hydronephrosis. Electronically Signed   By: Norleen DELENA Kil M.D.   On: 12/20/2023 10:08   IR Catheter Tube Change Result Date: 12/18/2023 CLINICAL DATA:  88 year old male with history of recurrent presacral abscess status post percutaneous drain placement on 11/11/2023. EXAM: IR CATHETER TUBE CHANGE COMPARISON:  11/11/2023 CONTRAST:  10 mL Isovue -300-administered via the percutaneous drainage catheter. MEDICATIONS: None. ANESTHESIA/SEDATION: None FLUOROSCOPY TIME:  22.2 mGy reference air kerma TECHNIQUE: Patient was positioned prone on the fluoroscopy table. The external portion of the existing percutaneous drainage catheter as well as the surrounding skin was prepped and draped in usual sterile fashion. A preprocedural spot fluoroscopic image was  obtained of the existing percutaneous drainage  catheter. A small amount of contrast was injected via the existing percutaneous drainage catheter and several fluoroscopic images were obtained in various obliquities. The external portion of the percutaneous drainage catheter was cut and cannulated with a short Amplatz wire. Under intermittent fluoroscopic guidance, the existing percutaneous drainage catheter was exchanged for a new 8 [place] mini loop percutaneous drainage catheter with end coiled and locked within the decompressed abscess cavity. Contrast injection confirmed appropriate position functionality of the percutaneous drainage catheter. The percutaneous drainage catheter was connected to a drainage back and secured in place within interrupted suture and a StatLock device. A dressing was applied. The patient tolerated the procedure well without immediate postprocedural complication. 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 IMPRESSION: 1. 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 2. Technically successful downsize from a 10 [place] multipurpose drain to an 8 [place] mini loop. PLAN: Discontinue flushing and keep to bag drainage. Follow-up in 3-4 weeks for drain evaluation with CT and drain injection. Ester Sides, MD [facility] Radiology Electronically Signed   By: Ester Sides M.D.   On: 12/18/2023 14:27   IR Radiologist Eval & Mgmt Result Date: 12/18/2023 EXAM: ESTABLISHED PATIENT OFFICE VISIT CHIEF COMPLAINT: See Epic note. HISTORY OF PRESENT ILLNESS: See Epic note. REVIEW OF SYSTEMS: See Epic note. PHYSICAL EXAMINATION: See Epic note. ASSESSMENT AND PLAN: See Epic note. Ester Sides, MD [facility] Radiology Electronically Signed   By: Ester Sides M.D.   On: 12/18/2023 14:23     Labs:  CBC: Recent Labs    11/30/23 0500 12/01/23 0500 12/02/23 0428 01/07/24 0841  WBC 8.2 6.7 6.4 11.2*  HGB 7.7* 7.4* 7.7* 9.2*  HCT 23.7* 22.5* 22.9* 29.1*  PLT 177 167 193 235    COAGS: Recent Labs    07/13/23 0658 07/16/23 0630 11/09/23 1804 11/11/23 0005 11/11/23 0706 11/11/23 2041 11/12/23 0900 11/27/23 1412  INR 1.1  --   --  1.3*  --   --   --  1.1  APTT 44*   < > 42*  --  35 35 64*  --    < > = values in this interval not displayed.    BMP: Recent Labs    11/29/23 0230 11/30/23 0500 12/01/23 0500 12/02/23 0428  NA 139 139 137 136  K 3.8 3.7 4.1 4.9  CL 101 99 97* 101  CO2 26 24 28 26   GLUCOSE 92 128* 113* 92  BUN 21 39* 27* 44*  CALCIUM  8.7* 8.6* 8.8* 8.8*  CREATININE 6.14* 8.06* 5.24* 7.05*  GFRNONAA 8* 6* 10* 7*    LIVER FUNCTION TESTS: Recent Labs    09/07/23 0609 09/08/23 1948 11/09/23 1804 11/10/23 1148 11/27/23 1412 11/29/23 0230 11/30/23 0500 12/01/23 0500 12/02/23 0428  BILITOT 1.0  --  1.1  --  0.8 1.1  --   --   --   AST 16  --  16  --  25 23  --   --   --   ALT 15  --  32  --  15 16  --   --   --   ALKPHOS 96  --  77  --  69 56  --   --   --   PROT 7.1  --  6.5  --  6.6 5.8*  --   --   --   ALBUMIN  3.7   < >  2.6*   < > 3.3* 3.1* 3.0* 3.0* 3.0*   < > = values in this interval not displayed.    TUMOR MARKERS: No results for input(s): AFPTM, CEA, CA199, CHROMGRNA in the last 8760 hours.  Assessment and Plan:  Chronic presacral fluid collection: Joshua Riley is a 88 y.o. male with a history of ESRD on HD, obstructive uropathy w/ left PCN, ulcerative colitis w/ ileostomy, and chronic presacral fluid collections requiring repeat transgluteal drain placement. His most recent TG drain was dislodged and repeat CT revealed residual fluid collection. He presents today for replacement of his TG drain with Dr. KANDICE Banner. Procedure to be performed under moderate sedation.   Risks and benefits discussed with the  patient including bleeding, infection, damage to adjacent structures, bowel perforation/fistula connection, and sepsis.  All of the patient's questions were answered, patient is agreeable to proceed. Consent signed and in chart.  Thank you for this interesting consult. I greatly enjoyed meeting Joshua Riley and look forward to participating in their care. A copy of this report was sent to the requesting provider on this date.  Electronically Signed: See Beharry M Toluwani Ruder, PA-C 01/07/2024, 9:00 AM   I spent a total of 10 Minutes in face to face clinical consultation, greater than 50% of which was counseling/coordinating care for drain placement.

## 2024-01-07 NOTE — Discharge Instructions (Signed)
Abscess Drain Home Care Abscess drains are used to remove infection fluid and drainage that normally builds up with infection. An abscess drain helps to drain infection and promotes healing.   Supplies needed: Tape. Split gauze drain sponge: 4 x 4 inches (10 x 10 cm). Container for measuring drainage Tegaderm (clear outer dressing) Syringe with sterile saline How to care for your surgical drain Keep drain site as clean and dry as possible  This is important to help prevent infection. If your drain is placed at your back, or any other hard-to-reach area, ask another person to assist you in performing the following tasks.  You may shower with the clear occlusive dressing on the drain site.  Wrapping plastic wrap around your waist while showering may also help keep the dressing site dry.   General care Keep the skin around the drain dry and covered with a dressing at all times. Check your drain area every day for signs of infection. Check for: Redness, swelling, or pain. Pus or a bad smell. A significant increase in drainage. Tenderness or pressure at the drain exit site. Leaking around the drain tube site.  Changing the dressing  Change your dressing as needed.  Change it more often if needed to keep the dressing dry. Make sure you: Gather your supplies. Wash your hands with soap and water before you change your dressing. If soap and water are not available, use hand sanitizer. Remove the old dressing. Avoid using scissors to do that. Wash your hands with soap and water again after removing the old dressing. Place the tube through the slit in a drain sponge. Place the drain sponge so that it covers your wound. Place the gauze square or another drain sponge on top of the drain sponge that is on the wound. Make sure the tube is between those layers. Use Tegaderm clear dressing over the guaze to keep dressing dry.  Use flexitrac securing device to hold catheter in place.  Wash your hands with  soap and water. Write down the color of your drainage and how often you change your dressing. How to empty your active drain  Make sure that you have a measuring cup that you can empty your drainage into. Wash your hands with soap and water. If soap and water are not available, use hand sanitizer. Unscrew the end of the drainage bag and drain into the measuring cup. Turn the device upside down and gently squeeze. Empty all of the drainage into the measuring cup. Screw the end of the drainage bag in opposite direction to close the bag. Write down the amount of drainage subtracted by the amount of flush used to flush the drainage tube   Flush the drainage down the toilet. Wash your hands with soap and water. Contact a health care provider if: You have redness, swelling, or pain around your drain area. You have pus or a bad smell coming from your drain area. You have a fever or chills. The skin around your drain is warm to the touch. The amount of drainage that you have is increasing instead of decreasing. There is a sudden stop or a sudden decrease in the amount of drainage that you have. Your drain tube falls out. Your active drain does not stay compressed after you empty it. Summary  Abscess drains are used to remove extra fluid that normally builds up with an infection  It is important to care for your drain to prevent infection. If your drain is placed at  your back, or any other hard-to-reach area, ask another person to assist you. Contact your health care provider if you have redness, swelling, or pain around your drain area. This information is not intended to replace advice given to you by your health care provider. Make sure you discuss any questions you have with your health care provider. Document Revised: 07/28/2018 Document Reviewed: 07/28/2018 Elsevier Patient Education  [place].

## 2024-01-11 ENCOUNTER — Other Ambulatory Visit

## 2024-01-12 LAB — AEROBIC/ANAEROBIC CULTURE W GRAM STAIN (SURGICAL/DEEP WOUND)

## 2024-01-19 ENCOUNTER — Ambulatory Visit: Admitting: Family Medicine

## 2024-01-21 ENCOUNTER — Encounter: Payer: Self-pay | Admitting: Family Medicine

## 2024-01-21 ENCOUNTER — Ambulatory Visit (INDEPENDENT_AMBULATORY_CARE_PROVIDER_SITE_OTHER): Admitting: Family Medicine

## 2024-01-21 ENCOUNTER — Other Ambulatory Visit (HOSPITAL_COMMUNITY)

## 2024-01-21 VITALS — BP 100/70 | HR 72 | Temp 97.7°F | Ht 68.0 in | Wt 158.2 lb

## 2024-01-21 DIAGNOSIS — M25512 Pain in left shoulder: Secondary | ICD-10-CM

## 2024-01-21 DIAGNOSIS — R238 Other skin changes: Secondary | ICD-10-CM

## 2024-01-21 DIAGNOSIS — L89111 Pressure ulcer of right upper back, stage 1: Secondary | ICD-10-CM

## 2024-01-21 NOTE — Progress Notes (Signed)
 Phone [phone] In person visit   Subjective:   Joshua Riley is a 88 y.o. 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    Priority: High   Nephrostomy present (HCC) 10/22/2021    Priority: High   History of anemia due to chronic kidney disease 07/08/2023    Priority: Medium    Depression 04/08/2023    Priority: Medium    History of sepsis 06/02/2022    Priority: Medium    Glaucoma 11/18/2021    Priority: Medium    Idiopathic neuropathy 10/22/2021    Priority: Medium    Abnormal bladder CT 03/23/2023    Priority: Low   Coag negative Staphylococcus bacteremia 06/06/2022    Priority: Low   SBO (small bowel obstruction) (HCC) 11/18/2021    Priority: Low   Cerebrovascular disease 12/02/2023   Syncope 11/28/2023   Hypotension 11/28/2023   Seizure like activity 11/28/2023   Acute pericarditis 11/11/2023   Sacral osteomyelitis (HCC) 11/11/2023   Chest pain 11/09/2023   Abscess of abdominal cavity (HCC) 07/17/2023   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     albuterol  (VENTOLIN  HFA) 108 (90 Base) MCG/ACT inhaler Inhale 1-2 puffs into the lungs every 6 (six) hours as needed for wheezing or shortness of breath.     atorvastatin  (LIPITOR) 20 MG tablet Take 1 tablet (20 mg total) by mouth daily. 90 tablet 0   budesonide  (PULMICORT ) 0.5 MG/2ML nebulizer solution Take 0.5 mg by nebulization 2 (two) times daily as needed (for respiratory flares).     calcitRIOL  (ROCALTROL ) 0.5 MCG capsule Take 1 capsule (0.5 mcg total) by mouth every Monday, Wednesday, and Friday with hemodialysis.     Cyanocobalamin (VITAMIN B-12 PO) Take 1 capsule by mouth in the morning and at bedtime.     dorzolamide  (TRUSOPT ) 2 % ophthalmic solution Place 1 drop into the right eye at bedtime.     ELIQUIS  2.5 MG TABS tablet Take 1 tablet (2.5 mg total) by mouth 2 (two) times daily. 60 tablet 11   fludrocortisone  (FLORINEF ) 0.1 MG tablet Take 1 tablet (0.1 mg total) by mouth daily.     folic acid  (FOLVITE ) 1 MG tablet Take 1 tablet (1 mg total) by mouth daily. 30 tablet 0   gabapentin  (NEURONTIN ) 300 MG capsule Take 300 mg by mouth at bedtime.     loperamide  (IMODIUM ) 2 MG capsule Take 1 capsule (2 mg total)  by mouth as needed for diarrhea or loose stools. 20 capsule 0   midodrine  (PROAMATINE ) 10 MG tablet Take 2 tablets (20 mg total) by mouth 3 (three) times daily with meals.     mometasone -formoterol  (DULERA ) 200-5 MCG/ACT AERO Inhale 2 puffs into the lungs 2 (two) times daily. 1 each 1   multivitamin (RENA-VIT) TABS tablet Take 1 tablet by mouth daily.     Omega Fatty Acids-Vitamins (OMEGA-3 GUMMIES) CHEW Chew 1 tablet by mouth in the morning and at bedtime.     pantoprazole  (PROTONIX ) 40 MG tablet Take 1 tablet (40 mg total) by mouth 2 (two) times daily. 60 tablet 1   ramelteon  (ROZEREM ) 8 MG tablet Take 1 tablet (8 mg total) by mouth at bedtime. 30 tablet 5    sertraline  (ZOLOFT ) 50 MG tablet Take 25 mg by mouth at bedtime.     sevelamer  carbonate (RENVELA ) 800 MG tablet Take 1,600 mg by mouth 3 (three) times daily with meals.     Sodium Chloride  Flush (SALINE FLUSH) 0.9 % SOLN Use 5 mLs by Intracatheter route daily as directed. 150 mL 1   timolol  (TIMOPTIC ) 0.5 % ophthalmic solution Place 1 drop into both eyes in the morning and at bedtime.     traMADol  (ULTRAM ) 50 MG tablet Take 1 tablet (50 mg total) by mouth every 8 (eight) hours as needed (for pain). 12 tablet 0   TYLENOL  325 MG tablet Take 325-650 mg by mouth every 6 (six) hours as needed for mild pain (pain score 1-3) or headache.     No current facility-administered medications for this visit.     Objective:  BP 100/70   Pulse 72   Temp 97.7 F (36.5 C)   Ht 5' 8 (1.727 m)   Wt 158 lb 3.2 oz (71.8 kg)   SpO2 98%   BMI 24.05 kg/m  Gen: NAD, resting comfortably CV: irregularly irregular  Lungs: CTAB no crackles, wheeze, rhonchi Abdomen: soft/nontender/nondistended/normal bowel sounds. No rebound or guarding.  Skin: warm, dry, other than nonblanching redness of the right upper back-this was under transparent dressing and when this was removed there was slight amount of purulence and odor-this was thoroughly cleaned and transparent dressing was reapplied Shoulder: Inspection reveals no abnormalities, atrophy or asymmetry. Palpation is normal with no tenderness over AC joint or bicipital groove. ROM is full in all planes. Rotator cuff strength normal throughout. No signs of impingement with negative Neer and Hawkin's tests, empty can. Speeds and Yergason's tests normal. No painful arc and no drop arm sign.       Assessment and Plan   # Prior hospitalization -patient was hospitalized from 11/27/2023 to 12/03/2023-we had hospital follow-up on 12/15/2023 related to chronic orthostasis despite high-dose midodrine  that she was also started on Florinef . - Prior sacral osteomyelitis and  sacral fluid collection-patient had completed prolonged course of ertapenem  and vancomycin  and thankfully fluid space had collapsed on most recent imaging.  Plan was for 1 to 2-week repeat imaging with interventional radiology-this was repeated on June 13-no undrained fluid at that point -At last visit he was unable to return to assisted living until he had his drain removed by interventional radiology-he was in rehab at last visit.  We discussed about home health with aide but I was honest about frequent staffing issues  # irritation at pigtail catheter site S: history:  Recurrent fluid collection-after prior drain removal (actually appears fell out per [facility] notes) patient had a repeat 2.7 x 3.4 cm  accumulation on 12/30/2023 and he required placement of 8 French pigtail catheter for chronic pelvic abscess on 01/07/2024  since this was placed 2 weeks ago he has had to keep his pants below it because if it is above or on it it hurts severely and he wants to make sure area is not infected  A/P: I think the positioning of this is can make it difficult for him to wear his pants at or above this-does not appear acutely infected to me-some induration but without any fluctuance around both the suture and the catheter itself-when pulling the pants over this it pushes up against the skin even more improvise irritation/pain-his solution of keeping the pant slightly lower I think may be the best option   # Left shoulder pain S:he was waiting for transport to dialysis at his house and got tangled in his feet and fell down onto left shoulder- someone from transport picked him up. Ongoing shoulder pain since that time.   A/P: Left shoulder pain after fall-offered x-ray today.  Reassuring shoulder exam.  He declines x-ray and since he is improving the symptoms to monitor  # Abnormal bladder on CT scan-we referred [facility] to [facility] - Does have baseline nephrostomy tube in place -We need to check in next  visit about status of this referral-can take some time to get into urology but I do not currently see notes so we will reassess at follow-up in August   # Pressure ulcer right upper back-patient reports leaning on this area in his chair and even positioning today he seems to lean against this-encouraged frequent position changes.  Had a transparent dressing that may have been in place for weeks-we remove this and there was an odor to it-we cleaned to the best of our ability and reapply transparent dressing and encouraged 3 to 5-day changes and frequent position changes to take pressure off the area-if not improving may consider wound care consult or at least home health consult -She reports an aide helping 4 hours a day so they can hopefully help him change this  Recommended follow up: Keep either of next 2 visits in August-he will discuss with daughter Future Appointments  Date Time Provider Department Center  02/18/2024 10:00 AM Katrinka Garnette KIDD, MD [facility]  02/18/2024 10:55 AM Swinyer, Rosaline HERO, NP [facility]  02/24/2024  1:40 PM [facility] ANNUAL WELLNESS VISIT 1 [facility]  02/29/2024  2:20 PM Katrinka Garnette KIDD, MD [facility]  03/01/2024  8:30 AM Geronimo Amel, MD [facility] None    Lab/Order associations:   ICD-10-CM   1. Skin irritation  R23.8     2. Acute pain of left shoulder  M25.512     3. Pressure injury of right upper back, stage 1  L89.111       Time Spent: 34 minutes of total time (1:40 PM- 2:14 PM) was spent on the date of the encounter performing the following actions: chart review prior to seeing the patient, obtaining history, performing a medically necessary exam, counseling on the workup and treatment plan including limitations around his catheter and importance of keeping this to reduce risk of pelvic abscess reaccumulation, placing orders, and documenting in our EHR.    Return precautions advised.  Garnette Katrinka, MD

## 2024-01-21 NOTE — Patient Instructions (Addendum)
 The pigtail catheter site does not look infected but I can see where it would irritate you if pressure is placed on it- I think you are working around this about as best as possible  Shoulder without obvious fracture or rotator cuff injury- offered x-ray and you wanted to hold off for now- glad improving with time  You do have an area of irritation on your back potentially where you are leaning back in your chair on it- try to change positions and have them update with clear dressing to reduce friction on area at least every 3 -5 days at the longest (it looks like they left the other one on since rehab and I think that in itself was bothering the skin). Need to wash area it once removed every 3 days.   Recommended follow up: scheduled on August 14th and 25th if you want to come with your daughter perhaps see which date works best and cancel the other- I'm also happy to use this visit to help anyway I can

## 2024-01-25 ENCOUNTER — Emergency Department (HOSPITAL_BASED_OUTPATIENT_CLINIC_OR_DEPARTMENT_OTHER)

## 2024-01-25 ENCOUNTER — Other Ambulatory Visit: Payer: Self-pay

## 2024-01-25 ENCOUNTER — Inpatient Hospital Stay (HOSPITAL_BASED_OUTPATIENT_CLINIC_OR_DEPARTMENT_OTHER)
Admission: EM | Admit: 2024-01-25 | Discharge: 2024-02-03 | DRG: 871 | Disposition: A | Discharge status: Home / Self Care | Attending: Pulmonary Disease | Admitting: Pulmonary Disease

## 2024-01-25 ENCOUNTER — Encounter (HOSPITAL_BASED_OUTPATIENT_CLINIC_OR_DEPARTMENT_OTHER): Payer: Self-pay | Admitting: Internal Medicine

## 2024-01-25 DIAGNOSIS — A419 Sepsis, unspecified organism: Secondary | ICD-10-CM | POA: Diagnosis not present

## 2024-01-25 DIAGNOSIS — Z881 Allergy status to other antibiotic agents status: Secondary | ICD-10-CM

## 2024-01-25 DIAGNOSIS — J449 Chronic obstructive pulmonary disease, unspecified: Secondary | ICD-10-CM | POA: Diagnosis present

## 2024-01-25 DIAGNOSIS — Z7901 Long term (current) use of anticoagulants: Secondary | ICD-10-CM

## 2024-01-25 DIAGNOSIS — Z7951 Long term (current) use of inhaled steroids: Secondary | ICD-10-CM

## 2024-01-25 DIAGNOSIS — Z992 Dependence on renal dialysis: Secondary | ICD-10-CM

## 2024-01-25 DIAGNOSIS — I3139 Other pericardial effusion (noninflammatory): Secondary | ICD-10-CM | POA: Diagnosis not present

## 2024-01-25 DIAGNOSIS — E871 Hypo-osmolality and hyponatremia: Secondary | ICD-10-CM | POA: Diagnosis present

## 2024-01-25 DIAGNOSIS — K651 Peritoneal abscess: Secondary | ICD-10-CM | POA: Diagnosis present

## 2024-01-25 DIAGNOSIS — Z79899 Other long term (current) drug therapy: Secondary | ICD-10-CM

## 2024-01-25 DIAGNOSIS — I4821 Permanent atrial fibrillation: Secondary | ICD-10-CM | POA: Diagnosis present

## 2024-01-25 DIAGNOSIS — E872 Acidosis, unspecified: Secondary | ICD-10-CM | POA: Diagnosis present

## 2024-01-25 DIAGNOSIS — D631 Anemia in chronic kidney disease: Secondary | ICD-10-CM | POA: Diagnosis present

## 2024-01-25 DIAGNOSIS — M4628 Osteomyelitis of vertebra, sacral and sacrococcygeal region: Secondary | ICD-10-CM | POA: Diagnosis present

## 2024-01-25 DIAGNOSIS — E2740 Unspecified adrenocortical insufficiency: Secondary | ICD-10-CM | POA: Diagnosis present

## 2024-01-25 DIAGNOSIS — K56609 Unspecified intestinal obstruction, unspecified as to partial versus complete obstruction: Principal | ICD-10-CM | POA: Diagnosis present

## 2024-01-25 DIAGNOSIS — Z1152 Encounter for screening for COVID-19: Secondary | ICD-10-CM

## 2024-01-25 DIAGNOSIS — Z7952 Long term (current) use of systemic steroids: Secondary | ICD-10-CM

## 2024-01-25 DIAGNOSIS — Z933 Colostomy status: Secondary | ICD-10-CM

## 2024-01-25 DIAGNOSIS — Z86718 Personal history of other venous thrombosis and embolism: Secondary | ICD-10-CM

## 2024-01-25 DIAGNOSIS — G629 Polyneuropathy, unspecified: Secondary | ICD-10-CM | POA: Diagnosis present

## 2024-01-25 DIAGNOSIS — I959 Hypotension, unspecified: Secondary | ICD-10-CM | POA: Diagnosis present

## 2024-01-25 DIAGNOSIS — K435 Parastomal hernia without obstruction or  gangrene: Secondary | ICD-10-CM | POA: Diagnosis present

## 2024-01-25 DIAGNOSIS — R6521 Severe sepsis with septic shock: Secondary | ICD-10-CM | POA: Diagnosis present

## 2024-01-25 DIAGNOSIS — N186 End stage renal disease: Secondary | ICD-10-CM | POA: Diagnosis present

## 2024-01-25 DIAGNOSIS — F32A Depression, unspecified: Secondary | ICD-10-CM | POA: Diagnosis present

## 2024-01-25 DIAGNOSIS — E875 Hyperkalemia: Secondary | ICD-10-CM | POA: Diagnosis present

## 2024-01-25 DIAGNOSIS — I9589 Other hypotension: Secondary | ICD-10-CM | POA: Diagnosis present

## 2024-01-25 DIAGNOSIS — Z85828 Personal history of other malignant neoplasm of skin: Secondary | ICD-10-CM

## 2024-01-25 DIAGNOSIS — Z8673 Personal history of transient ischemic attack (TIA), and cerebral infarction without residual deficits: Secondary | ICD-10-CM

## 2024-01-25 DIAGNOSIS — Z87891 Personal history of nicotine dependence: Secondary | ICD-10-CM

## 2024-01-25 HISTORY — DX: Unspecified intestinal obstruction, unspecified as to partial versus complete obstruction: K56.609

## 2024-01-25 LAB — COMPREHENSIVE METABOLIC PANEL WITH GFR
ALT: 27 U/L (ref 0–44)
AST: 28 U/L (ref 15–41)
Albumin: 4.4 g/dL (ref 3.5–5.0)
Alkaline Phosphatase: 144 U/L — ABNORMAL HIGH (ref 38–126)
Anion gap: 18 — ABNORMAL HIGH (ref 5–15)
BUN: 33 mg/dL — ABNORMAL HIGH (ref 8–23)
CO2: 26 mmol/L (ref 22–32)
Calcium: 9.8 mg/dL (ref 8.9–10.3)
Chloride: 98 mmol/L (ref 98–111)
Creatinine, Ser: 6.02 mg/dL — ABNORMAL HIGH (ref 0.61–1.24)
GFR, Estimated: 9 mL/min — ABNORMAL LOW (ref >60)
Glucose, Bld: 154 mg/dL — ABNORMAL HIGH (ref 70–99)
Potassium: 4.1 mmol/L (ref 3.5–5.1)
Sodium: 142 mmol/L (ref 135–145)
Total Bilirubin: 0.7 mg/dL (ref 0.0–1.2)
Total Protein: 7.9 g/dL (ref 6.5–8.1)

## 2024-01-25 LAB — RESP PANEL BY RT-PCR (RSV, FLU A&B, COVID)  RVPGX2
Influenza A by PCR: NEGATIVE
Influenza B by PCR: NEGATIVE
Resp Syncytial Virus by PCR: NEGATIVE
SARS Coronavirus 2 by RT PCR: NEGATIVE

## 2024-01-25 LAB — CBC WITH DIFFERENTIAL/PLATELET
Abs Immature Granulocytes: 0.06 K/uL (ref 0.00–0.07)
Basophils Absolute: 0 K/uL (ref 0.0–0.1)
Basophils Relative: 0 %
Eosinophils Absolute: 0.1 K/uL (ref 0.0–0.5)
Eosinophils Relative: 1 %
HCT: 33.2 % — ABNORMAL LOW (ref 39.0–52.0)
Hemoglobin: 10.2 g/dL — ABNORMAL LOW (ref 13.0–17.0)
Immature Granulocytes: 1 %
Lymphocytes Relative: 4 %
Lymphs Abs: 0.5 K/uL — ABNORMAL LOW (ref 0.7–4.0)
MCH: 36.6 pg — ABNORMAL HIGH (ref 26.0–34.0)
MCHC: 30.7 g/dL (ref 30.0–36.0)
MCV: 119 fL — ABNORMAL HIGH (ref 80.0–100.0)
Monocytes Absolute: 0.6 K/uL (ref 0.1–1.0)
Monocytes Relative: 6 %
Neutro Abs: 9.6 K/uL — ABNORMAL HIGH (ref 1.7–7.7)
Neutrophils Relative %: 88 %
Platelets: 233 K/uL (ref 150–400)
RBC: 2.79 MIL/uL — ABNORMAL LOW (ref 4.22–5.81)
RDW: 18.8 % — ABNORMAL HIGH (ref 11.5–15.5)
WBC: 11 K/uL — ABNORMAL HIGH (ref 4.0–10.5)
nRBC: 0 % (ref 0.0–0.2)

## 2024-01-25 LAB — PROTIME-INR
INR: 1.3 — ABNORMAL HIGH (ref 0.8–1.2)
Prothrombin Time: 16.9 s — ABNORMAL HIGH (ref 11.4–15.2)

## 2024-01-25 LAB — LACTIC ACID, PLASMA
Lactic Acid, Venous: 2.6 mmol/L (ref 0.5–1.9)
Lactic Acid, Venous: 1.6 mmol/L (ref 0.5–1.9)

## 2024-01-25 MED ORDER — ONDANSETRON HCL 4 MG/2ML IJ SOLN
4.0000 mg | Freq: Once | INTRAMUSCULAR | Status: AC
  Administered 2024-01-25: 4 mg via INTRAVENOUS
  Filled 2024-01-25: qty 2

## 2024-01-25 MED ORDER — SODIUM CHLORIDE 0.9 % IV BOLUS (SEPSIS)
1000.0000 mL | Freq: Once | INTRAVENOUS | Status: AC
  Administered 2024-01-25: 1000 mL via INTRAVENOUS

## 2024-01-25 MED ORDER — LACTATED RINGERS IV SOLN: INTRAVENOUS | Status: AC

## 2024-01-25 MED ORDER — SODIUM CHLORIDE 0.9 % IV BOLUS
1000.0000 mL | Freq: Once | INTRAVENOUS | Status: AC
  Administered 2024-01-25: 1000 mL via INTRAVENOUS

## 2024-01-25 MED ORDER — IOHEXOL 300 MG/ML  SOLN
100.0000 mL | Freq: Once | INTRAMUSCULAR | Status: AC | PRN
  Administered 2024-01-25: 100 mL via INTRAVENOUS

## 2024-01-25 MED ORDER — MIDODRINE HCL 5 MG PO TABS: 10.0000 mg | ORAL_TABLET | ORAL | Status: DC

## 2024-01-25 MED ORDER — VANCOMYCIN HCL IN DEXTROSE 1-5 GM/200ML-% IV SOLN
1000.0000 mg | Freq: Once | INTRAVENOUS | Status: AC
  Administered 2024-01-25: 1000 mg via INTRAVENOUS
  Filled 2024-01-25: qty 200

## 2024-01-25 MED ORDER — SODIUM CHLORIDE 0.9 % IV SOLN
2.0000 g | Freq: Once | INTRAVENOUS | Status: AC
  Administered 2024-01-25: 2 g via INTRAVENOUS

## 2024-01-25 MED ORDER — FENTANYL CITRATE PF 50 MCG/ML IJ SOSY
50.0000 ug | PREFILLED_SYRINGE | Freq: Once | INTRAMUSCULAR | Status: AC
  Administered 2024-01-25: 50 ug via INTRAVENOUS
  Filled 2024-01-25: qty 1

## 2024-01-25 MED ORDER — METRONIDAZOLE 500 MG/100ML IV SOLN
500.0000 mg | Freq: Once | INTRAVENOUS | Status: AC
  Administered 2024-01-25: 500 mg via INTRAVENOUS
  Filled 2024-01-25: qty 100

## 2024-01-25 MED ORDER — SODIUM CHLORIDE 0.9 % IV BOLUS: 1000.0000 mL | Freq: Once | INTRAVENOUS | Status: DC

## 2024-01-25 MED ORDER — ALBUMIN HUMAN 25 % IV SOLN
25.0000 g | Freq: Once | INTRAVENOUS | Status: AC
  Administered 2024-01-25: 12.5 g via INTRAVENOUS
  Filled 2024-01-25: qty 100

## 2024-01-25 NOTE — Plan of Care (Addendum)
  [facility] emergency department to [name]   88 year old man history of ESRD on dialysis MWF schedule, ulcerative colitis with chronic ostomy in place, CVA, chronic sacral osteomyelitis recently completed course of IV ertapenem  and vancomycin , history of recurrent syncope, recent acute pericarditis resolved no longer on therapy, peripheral atrial fibrillation, chronic macrocytic anemia, chronic nephrostomy tube placed and orthostatic hypotension who has been present emergency department complaining of abdominal pain, nausea vomiting and reduced output through the ostomy since today morning.  Patient also complaining about shortness of breath.  Patient reported that he went for dialysis and blood pressure is low and stated that that is normal for him.  At presentation to ED patient found tachycardic and hypotensive blood pressure upper 70s range.  Patient is afebrile. CBC showing leukocytosis 11, stable H&H and normal platelet count. CMP unremarkable except evidence of chronic ESRD and elevated AST 144, elevated anion gap 18. Elevated pro time INR. Elevated lactic acid level 2.6.  Pending repeat lactic acid level. Respiratory panel pending. Blood culture is pending.  Chest x-ray no acute disease process.  Cardiomegaly.  CT abdomen pelvis finding following: 1. Status post proctocolectomy with right abdominal ileostomy. Small parastomal hernia contains fat and a knuckle of small bowel. Interim development of moderate to marked fluid distension of the stomach with multiple dilated fluid-filled loops of proximal to mid small bowel and fecalized bowel in the pelvis consistent with small bowel obstruction. Transition point appears to be within the low anterior pelvis where there is adhesed appearance of multiple small bowel loops. 2. Left trans gluteal percutaneous drainage catheter with pigtail in the presacral region at the site of prior fluid collection. Residual soft  tissue thickening in the region with small residual fluid collection inferior to the pigtail measuring about 14 x 16 mm. 3. Moderate pericardial effusion, increased compared to prior, exhibits mild diffuse rim enhancement. Slight increased density values within the fluid. Correlate for infection/pericarditis. 4. Gallstone. 5. Thick-walled collapsed appearance of the urinary bladder with mild perivesical stranding. 6. Left percutaneous nephrostomy catheter with no hydronephrosis. Atrophic native kidneys.    In the ED patient has been treated with aztreonam  and vancomycin .  Also received 1.8L of NS bolus with sepsis protocol and currently on maintenance fluid LR 150 cc/h.   ED physician consulted general surgery Dr. Dasie who recommended placing NG tube and general surgery will see patient for consult.  Requested Dr. Dean to consult cardiology to discuss the finding of increased pericardial effusion which is stating and CT abdomen pelvis.   Hospitalist has been consulted for further management of sepsis unknown source of infection at this time and small bowel obstruction. Informed ED physician if patient blood pressure drops need to start pressor in that case have to inform us  again.  Cannot overload with fluid given ESRD patient. Checking ESR and CRP and ordered echocardiogram.    [facility] will assume care on arrival to accepting facility. Until arrival, care as per EDP. However, [facility] available 24/7 for questions and assistance. Check [email or web address] for on-call coverage. Nursing staff, please call [facility] Admits & Consults System-Wide number under Amion on patient's arrival so appropriate admitting provider can evaluate the pt.   Author: Mikaele Stecher, MD  [facility]

## 2024-01-25 NOTE — ED Notes (Signed)
 Dialysis patient

## 2024-01-25 NOTE — ED Triage Notes (Signed)
 Pt POV reporting gen abd pain, nausea, and reduced output from ostomy since this AM, concerned for SBO, hx same.

## 2024-01-25 NOTE — Consult Note (Shared)
 Joshua Riley January May 15, 1936  [id number].    Requesting MD: Mliss Boyers, MD Chief Complaint/Reason for Consult: small bowel obstruction  HPI:  Joshua Riley is an 88 yo male with a history of ESRD, a-fib, anemia, presacral fluid collection, and ulcerative colitis (with previous TAC and ileostomy), who presents to the ED with abdominal pain, distension, and decreased ileostomy output. *** A CT scan showed a small bowel obstruction with a transition in the pelvis. 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 He underwent dialysis today and has low blood pressure, which is his baseline.   He has a history of multiple prior SBOs, managed nonoperatively, as well as a recurrent presacral fluid collection, for which he currently has a percutaneous drain in place.  ROS: ROS  Family History  Problem Relation Age of Onset   Stroke Mother    Cancer Father    Esophageal cancer Brother     Past Medical History:  Diagnosis Date   A-fib (HCC)    Anemia    Arthritis    Cancer (HCC)    Basal cell   COVID-19    2021   Dysrhythmia    Afib-controlled on eliquis    ESRD (end stage renal disease) (HCC) 10/22/2021   Glaucoma 11/18/2021   History of DVT (deep vein thrombosis)    Hydronephrosis    managed wtih a PCN   Idiopathic neuropathy 10/22/2021   lyrica     Ileostomy in place [facility])    Obstructive uropathy    With chronic left nephrostomy   Old retinal detachment, total or subtotal    Orthostatic hypotension 10/22/2021   Sleep apnea    does not need a machine   Stroke (HCC)    Ulcerative colitis (HCC)    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    Past Surgical History:  Procedure Laterality Date   BASAL CELL CARCINOMA EXCISION     10/23   COLON SURGERY     creation of j pouch     and subsequent takedown of j pouch    EYE SURGERY     IR CATHETER TUBE CHANGE  12/18/2023   IR NEPHROSTOMY EXCHANGE LEFT  12/10/2021   IR NEPHROSTOMY EXCHANGE LEFT  04/22/2022   IR NEPHROSTOMY EXCHANGE LEFT  07/29/2022   IR NEPHROSTOMY EXCHANGE LEFT  10/28/2022   IR NEPHROSTOMY EXCHANGE LEFT  02/05/2023   IR NEPHROSTOMY EXCHANGE LEFT  05/07/2023   IR NEPHROSTOMY EXCHANGE LEFT  06/25/2023   IR NEPHROSTOMY EXCHANGE LEFT  07/17/2023   IR NEPHROSTOMY EXCHANGE LEFT  10/22/2023   IR RADIOLOGIST EVAL & MGMT  11/25/2023   IR RADIOLOGIST EVAL & MGMT  12/18/2023   LEFT HEART CATH AND CORONARY ANGIOGRAPHY N/A 07/22/2022   Procedure: LEFT HEART CATH AND CORONARY ANGIOGRAPHY;  Surgeon: Swaziland, Peter M, MD;  Location: [facility] INVASIVE CV LAB;  Service: Cardiovascular;  Laterality: N/A;   REVISION OF ARTERIOVENOUS GORETEX GRAFT Left 05/06/2022   Procedure: REDO LEFT THIGH ARTERIOVENOUS 4-7 MM GORETEX GRAFT;  Surgeon: Eliza Lonni RAMAN, MD;  Location: [facility] OR;  Service: Vascular;  Laterality: Left;   SMALL INTESTINE SURGERY     TOTAL COLECTOMY      Social History:  reports that he quit smoking about 40 years ago. His smoking use included cigarettes. He started smoking about 46 years ago. He has a  12 pack-year smoking history. He has never been exposed to tobacco smoke. He has never used smokeless tobacco. He reports current alcohol use of about 5.0 standard drinks of alcohol per week. He reports that he does not use drugs.  Allergies:  Allergies  Allergen Reactions   Baclofen Other (See Comments)    Altered mental status, after accidental overdose     Cephalosporins Rash   Ciprofloxacin Itching and Rash    (Not in a hospital admission)    Physical Exam: Blood pressure (!) 88/65, pulse (!) 102, temperature 98 F (36.7 C), temperature source Oral, resp. rate 14, height 5' 8 (1.727 m), weight 72.6 kg, SpO2 98%. ***  Results for orders placed or performed during the hospital encounter of 01/25/24 (from the past 48 hours)  Comprehensive  metabolic panel     Status: Abnormal   Collection Time: 01/25/24  6:54 PM  Result Value Ref Range   Sodium 142 135 - 145 mmol/L   Potassium 4.1 3.5 - 5.1 mmol/L   Chloride 98 98 - 111 mmol/L   CO2 26 22 - 32 mmol/L   Glucose, Bld 154 (H) 70 - 99 mg/dL    Comment: Glucose reference range applies only to samples taken after fasting for at least 8 hours.   BUN 33 (H) 8 - 23 mg/dL   Creatinine, Ser 3.97 (H) 0.61 - 1.24 mg/dL   Calcium  9.8 8.9 - 10.3 mg/dL   Total Protein 7.9 6.5 - 8.1 g/dL   Albumin  4.4 3.5 - 5.0 g/dL   AST 28 15 - 41 U/L   ALT 27 0 - 44 U/L   Alkaline Phosphatase 144 (H) 38 - 126 U/L   Total Bilirubin 0.7 0.0 - 1.2 mg/dL   GFR, Estimated 9 (L) >60 mL/min    Comment: (NOTE) Calculated using the CKD-EPI Creatinine Equation (2021)    Anion gap 18 (H) 5 - 15    Comment: Performed at [facility], [address] [place], [place] [address]  CBC with Differential     Status: Abnormal   Collection Time: 01/25/24  6:54 PM  Result Value Ref Range   WBC 11.0 (H) 4.0 - 10.5 K/uL   RBC 2.79 (L) 4.22 - 5.81 MIL/uL   Hemoglobin 10.2 (L) 13.0 - 17.0 g/dL   HCT 66.7 (L) 60.9 - 47.9 %   MCV 119.0 (H) 80.0 - 100.0 fL   MCH 36.6 (H) 26.0 - 34.0 pg   MCHC 30.7 30.0 - 36.0 g/dL   RDW 81.1 (H) 88.4 - 84.4 %   Platelets 233 150 - 400 K/uL   nRBC 0.0 0.0 - 0.2 %   Neutrophils Relative % 88 %   Neutro Abs 9.6 (H) 1.7 - 7.7 K/uL   Lymphocytes Relative 4 %   Lymphs Abs 0.5 (L) 0.7 - 4.0 K/uL   Monocytes Relative 6 %   Monocytes Absolute 0.6 0.1 - 1.0 K/uL   Eosinophils Relative 1 %   Eosinophils Absolute 0.1 0.0 - 0.5 K/uL   Basophils Relative 0 %   Basophils Absolute 0.0 0.0 - 0.1 K/uL   RBC Morphology MORPHOLOGY UNREMARKABLE    Immature Granulocytes 1 %   Abs Immature Granulocytes 0.06 0.00 - 0.07 K/uL    Comment: Performed at [facility], [address], [place], [place] [address]  Protime-INR     Status: Abnormal   Collection Time:  01/25/24  6:54 PM  Result Value Ref Range   Prothrombin Time 16.9 (H) 11.4 -  15.2 seconds   INR 1.3 (H) 0.8 - 1.2    Comment: (NOTE) INR goal varies based on device and disease states. Performed at [facility], [address], [place], [place] [address]   Lactic acid, plasma     Status: Abnormal   Collection Time: 01/25/24  7:07 PM  Result Value Ref Range   Lactic Acid, Venous 2.6 (HH) 0.5 - 1.9 mmol/L    Comment: Critical Value, Read Back and verified with M. Harles Evetts 19:53 01/25/24 by JINNY. Falicki Performed at [facility], [address], [place], [place] [address]   Lactic acid, plasma     Status: None   Collection Time: 01/25/24  9:01 PM  Result Value Ref Range   Lactic Acid, Venous 1.6 0.5 - 1.9 mmol/L    Comment: Performed at [facility], [address], [place], [place] [address]  Resp panel by RT-PCR (RSV, Flu A&B, Covid) Anterior Nasal Swab     Status: None   Collection Time: 01/25/24  9:01 PM   Specimen: Anterior Nasal Swab  Result Value Ref Range   SARS Coronavirus 2 by RT PCR NEGATIVE NEGATIVE    Comment: (NOTE) SARS-CoV-2 target nucleic acids are NOT DETECTED.  The SARS-CoV-2 RNA is generally detectable in upper respiratory specimens during the acute phase of infection. The lowest concentration of SARS-CoV-2 viral copies this assay can detect is 138 copies/mL. A negative result does not preclude SARS-Cov-2 infection and should not be used as the sole basis for treatment or other patient management decisions. A negative result may occur with  improper specimen collection/handling, submission of specimen other than nasopharyngeal swab, presence of viral mutation(s) within the areas targeted by this assay, and inadequate number of viral copies(<138 copies/mL). A negative result must be combined with clinical observations, patient history, and epidemiological information. The expected result is Negative.  Fact  Sheet for Patients:  BloggerCourse.com  Fact Sheet for Healthcare Providers:  SeriousBroker.it  This test is no t yet approved or cleared by the [place]  FDA and  has been authorized for detection and/or diagnosis of SARS-CoV-2 by FDA under an Emergency Use Authorization (EUA). This EUA will remain  in effect (meaning this test can be used) for the duration of the COVID-19 declaration under Section 564(b)(1) of the Act, 21 U.S.C.section 360bbb-3(b)(1), unless the authorization is terminated  or revoked sooner.       Influenza A by PCR NEGATIVE NEGATIVE   Influenza B by PCR NEGATIVE NEGATIVE    Comment: (NOTE) The Xpert Xpress SARS-CoV-2/FLU/RSV plus assay is intended as an aid in the diagnosis of influenza from Nasopharyngeal swab specimens and should not be used as a sole basis for treatment. Nasal washings and aspirates are unacceptable for Xpert Xpress SARS-CoV-2/FLU/RSV testing.  Fact Sheet for Patients: BloggerCourse.com  Fact Sheet for Healthcare Providers: SeriousBroker.it  This test is not yet approved or cleared by the [place]  FDA and has been authorized for detection and/or diagnosis of SARS-CoV-2 by FDA under an Emergency Use Authorization (EUA). This EUA will remain in effect (meaning this test can be used) for the duration of the COVID-19 declaration under Section 564(b)(1) of the Act, 21 U.S.C. section 360bbb-3(b)(1), unless the authorization is terminated or revoked.     Resp Syncytial Virus by PCR NEGATIVE NEGATIVE    Comment: (NOTE) Fact Sheet for Patients: BloggerCourse.com  Fact Sheet for Healthcare Providers: SeriousBroker.it  This test is not yet approved or cleared by the [place]  FDA and has been  authorized for detection and/or diagnosis of SARS-CoV-2 by FDA under an Emergency Use  Authorization (EUA). This EUA will remain in effect (meaning this test can be used) for the duration of the COVID-19 declaration under Section 564(b)(1) of the Act, 21 U.S.C. section 360bbb-3(b)(1), unless the authorization is terminated or revoked.  Performed at [facility], [address] [place], [place] [address]    CT ABDOMEN PELVIS W CONTRAST Result Date: 01/25/2024 CLINICAL DATA:  Pain nausea reduced ostomy output EXAM: CT ABDOMEN AND PELVIS WITH CONTRAST TECHNIQUE: Multidetector CT imaging of the abdomen and pelvis was performed using the standard protocol following bolus administration of intravenous contrast. RADIATION DOSE REDUCTION: This exam was performed according to the departmental dose-optimization program which includes automated exposure control, adjustment of the mA and/or kV according to patient size and/or use of iterative reconstruction technique. CONTRAST:  OMNIPAQUE  IOHEXOL  300 MG/ML  SOLN COMPARISON:  CT 12/30/2023, 12/18/2023 FINDINGS: Lower chest: Lung bases demonstrate atelectasis or scarring at the bases. Cardiomegaly. Moderate pericardial effusion, increased compared to prior, measures up to 19 mm maximum thickness along the right cardiac border, and exhibits mild diffuse rim enhancement. Slight increased density values within the fluid. Hepatobiliary: Gallstone. No focal hepatic abnormality or biliary dilatation Pancreas: Unremarkable. No pancreatic ductal dilatation or surrounding inflammatory changes. Spleen: Normal in size without focal abnormality. Adrenals/Urinary Tract: Adrenal glands are normal. Atrophic kidneys without hydronephrosis. Left percutaneous nephrostomy catheter with decreased collecting system. Renal cysts for which no imaging follow-up is recommended. Thick-walled collapsed appearance of the urinary bladder with mild perivesical stranding. Stomach/Bowel: Stomach is moderately distended with fluid. Interim development of  multiple dilated fluid-filled loops of proximal to mid small bowel, measuring up to 4.4 cm. Fecalized small bowel in the pelvis. Adhesed appearance of small-bowel loops to the anterior abdominal wall below the level of umbilicus. Decompressed small bowel in the region and remaining distal small bowel is decompressed. Patient is status post colectomy with right abdominal ileostomy. Small parastomal hernia contains fat and a knuckle of small bowel. Vascular/Lymphatic: Aortic atherosclerosis. No enlarged abdominal or pelvic lymph nodes. Reproductive: Slightly enlarged prostate Other: No free air. Left trans gluteal percutaneous drainage catheter since the prior exam with pigtail in the presacral region at the site of prior fluid collection. Residual soft tissue thickening in the region with small residual fluid inferior to the pigtail measuring about 14 x 16 mm on series 2, image 71. Musculoskeletal: No acute osseous abnormality. 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 Dialysis graft in the left thigh and groin with similar small fluid collection in the left groin measuring 19 mm, previously 19 mm. Small fluid collection measuring 14 mm adjacent to the lateral/arterial limb of the graft in the thigh, stable since 2024. IMPRESSION: 1. Status post proctocolectomy with right abdominal ileostomy. Small parastomal hernia contains fat and a knuckle of small bowel. Interim development of moderate to marked fluid distension of the stomach with multiple dilated fluid-filled loops of proximal to mid small bowel and fecalized bowel in the pelvis consistent with small bowel obstruction. Transition point appears to be within the low anterior pelvis where there is adhesed appearance of multiple small bowel loops. 2. Left trans gluteal percutaneous drainage catheter with pigtail in the presacral region at the site of prior fluid collection. Residual soft tissue thickening in the region with small residual fluid  collection inferior to the pigtail measuring about 14 x 16 mm. 3. Moderate pericardial effusion, increased compared to prior, exhibits mild diffuse  rim enhancement. Slight increased density values within the fluid. Correlate for infection/pericarditis. 4. Gallstone. 5. Thick-walled collapsed appearance of the urinary bladder with mild perivesical stranding. 6. Left percutaneous nephrostomy catheter with no hydronephrosis. Atrophic native kidneys. Aortic Atherosclerosis (ICD10-I70.0). Electronically Signed   By: Luke Bun M.D.   On: 01/25/2024 20:40   DG Chest Port 1 View Result Date: 01/25/2024 CLINICAL DATA:  Concern for sepsis EXAM: PORTABLE CHEST 1 VIEW COMPARISON:  None Available. FINDINGS: 11/27/2023 stable enlarged cardiac silhouette. No effusion, infiltrate, pneumothorax. Mild scarring at LEFT lung base. RIGHT subclavian stent. IMPRESSION: 1. No acute cardiopulmonary process. 2. Cardiomegaly. Electronically Signed   By: Jackquline Boxer M.D.   On: 01/25/2024 19:36      Assessment/Plan 88 yo male with multiple medical comorbidities, including UC s/p TAC with ileostomy, admitted with a recurrent small bowel obstruction. I personally reviewed his labs, imaging and notes. CT scan shows an SBO as well as a parastomal hernia, however there are no signs of strangulation of the loop of bowel within the hernia, and this appears decompressed and is not the source of obstruction. No indication for urgent surgical intervention. - NPO, IV fluid hydration - Place NG tube for decompression - Gastrografin  protocol - Surgery will follow  Leonor Dawn, MD [facility] Surgery General, Hepatobiliary and Pancreatic Surgery 01/25/24 10:57 PM

## 2024-01-25 NOTE — ED Notes (Signed)
 Pts family updated via phone by this RN

## 2024-01-25 NOTE — Sepsis Progress Note (Signed)
 Elink monitoring for the code sepsis protocol.

## 2024-01-25 NOTE — ED Provider Notes (Signed)
  EMERGENCY DEPARTMENT AT [facility] Provider Note   CSN: [id number] Arrival date & time: 01/25/24  1843     Patient presents with: Abdominal Pain   Joshua Riley is a 88 y.o. male.  {Add pertinent medical, surgical, social history, OB history to HPI:32947} Pt is a 88 yo male with pmhx significant for ESRD on HD (MWF), hx of hypotension after dialysis (on midodrine ), anemia, neuropathy, hx DVT, afib (on Eliquis ), basal cell cancer, obstructive uropathy s/p left PCN placement, UC s/p colectomy with ileostomy, and hx pelvic abscesses s/p pigtail placement.  Pt comes to the ED today with abd pain.  He is concerned he has a sbo since he has not had much output in his ileostomy bag today.  He did go to dialysis today and bp is low.  He said this is normal for him.          Prior to Admission medications   Medication Sig Start Date End Date Taking? Authorizing Provider  albuterol  (PROVENTIL ) (2.5 MG/3ML) 0.083% nebulizer solution Take 2.5 mg by nebulization every 6 (six) hours as needed for wheezing or shortness of breath.    [provider]  albuterol  (VENTOLIN  HFA) 108 (90 Base) MCG/ACT inhaler Inhale 1-2 puffs into the lungs every 6 (six) hours as needed for wheezing or shortness of breath.    [provider]  atorvastatin  (LIPITOR) 20 MG tablet Take 1 tablet (20 mg total) by mouth daily. 11/14/23   Sebastian Toribio GAILS, MD  budesonide  (PULMICORT ) 0.5 MG/2ML nebulizer solution Take 0.5 mg by nebulization 2 (two) times daily as needed (for respiratory flares).    [provider]  calcitRIOL  (ROCALTROL ) 0.5 MCG capsule Take 1 capsule (0.5 mcg total) by mouth every Monday, Wednesday, and Friday with hemodialysis. 12/04/23   Danford, Lonni SQUIBB, MD  Cyanocobalamin (VITAMIN B-12 PO) Take 1 capsule by mouth in the morning and at bedtime.    [provider]  dorzolamide  (TRUSOPT ) 2 % ophthalmic solution Place 1 drop into the right eye at  bedtime. 06/17/23   [provider]  ELIQUIS  2.5 MG TABS tablet Take 1 tablet (2.5 mg total) by mouth 2 (two) times daily. 08/07/22   Hobart Powell BRAVO, MD  fludrocortisone  (FLORINEF ) 0.1 MG tablet Take 1 tablet (0.1 mg total) by mouth daily. 12/04/23   Danford, Lonni SQUIBB, MD  folic acid  (FOLVITE ) 1 MG tablet Take 1 tablet (1 mg total) by mouth daily. 07/21/23   Regalado, Belkys A, MD  gabapentin  (NEURONTIN ) 300 MG capsule Take 300 mg by mouth at bedtime. 09/09/23   [provider]  loperamide  (IMODIUM ) 2 MG capsule Take 1 capsule (2 mg total) by mouth as needed for diarrhea or loose stools. 11/13/23   Sebastian Toribio GAILS, MD  midodrine  (PROAMATINE ) 10 MG tablet Take 2 tablets (20 mg total) by mouth 3 (three) times daily with meals. 12/22/23   Katrinka Garnette KIDD, MD  mometasone -formoterol  (DULERA ) 200-5 MCG/ACT AERO Inhale 2 puffs into the lungs 2 (two) times daily. 09/08/23   Rosario Eland I, MD  multivitamin (RENA-VIT) TABS tablet Take 1 tablet by mouth daily.    [provider]  Omega Fatty Acids-Vitamins (OMEGA-3 GUMMIES) CHEW Chew 1 tablet by mouth in the morning and at bedtime.    [provider]  pantoprazole  (PROTONIX ) 40 MG tablet Take 1 tablet (40 mg total) by mouth 2 (two) times daily. 11/13/23   Sebastian Toribio GAILS, MD  ramelteon  (ROZEREM ) 8 MG tablet Take 1 tablet (8  mg total) by mouth at bedtime. 10/27/23   Katrinka Garnette KIDD, MD  sertraline  (ZOLOFT ) 50 MG tablet Take 25 mg by mouth at bedtime.    [provider]  sevelamer  carbonate (RENVELA ) 800 MG tablet Take 1,600 mg by mouth 3 (three) times daily with meals.    [provider]  Sodium Chloride  Flush (SALINE FLUSH) 0.9 % SOLN Use 5 mLs by Intracatheter route daily as directed. 11/25/23   Caperilla, Marissa N, PA  timolol  (TIMOPTIC ) 0.5 % ophthalmic solution Place 1 drop into both eyes in the morning and at bedtime.    [provider]  traMADol  (ULTRAM ) 50 MG tablet Take 1 tablet  (50 mg total) by mouth every 8 (eight) hours as needed (for pain). 12/03/23   Danford, Lonni SQUIBB, MD  TYLENOL  325 MG tablet Take 325-650 mg by mouth every 6 (six) hours as needed for mild pain (pain score 1-3) or headache.    [provider]    Allergies: Baclofen, Cephalosporins, and Ciprofloxacin    Review of Systems  Gastrointestinal:  Positive for abdominal pain.  All other systems reviewed and are negative.   Updated Vital Signs BP (!) 85/65   Pulse 97   Temp 98 F (36.7 C) (Oral)   Resp 18   Ht 5' 8 (1.727 m)   Wt 72.6 kg   SpO2 98%   BMI 24.33 kg/m   Physical Exam Vitals and nursing note reviewed.  Constitutional:      Appearance: He is well-developed.  HENT:     Head: Normocephalic and atraumatic.     Mouth/Throat:     Mouth: Mucous membranes are moist.     Pharynx: Oropharynx is clear.  Eyes:     Extraocular Movements: Extraocular movements intact.     Pupils: Pupils are equal, round, and reactive to light.  Cardiovascular:     Rate and Rhythm: Normal rate and regular rhythm.     Heart sounds: Normal heart sounds.  Pulmonary:     Effort: Pulmonary effort is normal.     Breath sounds: Normal breath sounds.  Abdominal:     General: Abdomen is flat. Bowel sounds are decreased.     Palpations: Abdomen is soft.     Tenderness: There is generalized abdominal tenderness.  Skin:    General: Skin is warm.     Capillary Refill: Capillary refill takes less than 2 seconds.     Comments: Pressure sore upper right back  Neurological:     General: No focal deficit present.     Mental Status: He is alert and oriented to person, place, and time.  Psychiatric:        Mood and Affect: Mood normal.        Behavior: Behavior normal.     (all labs ordered are listed, but only abnormal results are displayed) Labs Reviewed  COMPREHENSIVE METABOLIC PANEL WITH GFR - Abnormal; Notable for the following components:      Result Value   Glucose, Bld 154 (*)     BUN 33 (*)    Creatinine, Ser 6.02 (*)    Alkaline Phosphatase 144 (*)    GFR, Estimated 9 (*)    Anion gap 18 (*)    All other components within normal limits  LACTIC ACID, PLASMA - Abnormal; Notable for the following components:   Lactic Acid, Venous 2.6 (*)    All other components within normal limits  CBC WITH DIFFERENTIAL/PLATELET - Abnormal; Notable for the following components:  WBC 11.0 (*)    RBC 2.79 (*)    Hemoglobin 10.2 (*)    HCT 33.2 (*)    MCV 119.0 (*)    MCH 36.6 (*)    RDW 18.8 (*)    Neutro Abs 9.6 (*)    Lymphs Abs 0.5 (*)    All other components within normal limits  PROTIME-INR - Abnormal; Notable for the following components:   Prothrombin Time 16.9 (*)    INR 1.3 (*)    All other components within normal limits  CULTURE, BLOOD (ROUTINE X 2)  CULTURE, BLOOD (ROUTINE X 2)  RESP PANEL BY RT-PCR (RSV, FLU A&B, COVID)  RVPGX2  LACTIC ACID, PLASMA  URINALYSIS, W/ REFLEX TO CULTURE (INFECTION SUSPECTED)    EKG: EKG Interpretation Date/Time:  Monday January 25 2024 19:01:43 EDT Ventricular Rate:  120 PR Interval:    QRS Duration:  141 QT Interval:  406 QTC Calculation: 574 R Axis:   78  Text Interpretation: Atrial fibrillation Right bundle branch block Inferior infarct, old Since last tracing rate faster Confirmed by Dean Clarity [phone]) on 01/25/2024 7:42:22 PM  Radiology: CT ABDOMEN PELVIS W CONTRAST Result Date: 01/25/2024 CLINICAL DATA:  Pain nausea reduced ostomy output EXAM: CT ABDOMEN AND PELVIS WITH CONTRAST TECHNIQUE: Multidetector CT imaging of the abdomen and pelvis was performed using the standard protocol following bolus administration of intravenous contrast. RADIATION DOSE REDUCTION: This exam was performed according to the departmental dose-optimization program which includes automated exposure control, adjustment of the mA and/or kV according to patient size and/or use of iterative reconstruction technique. CONTRAST:  OMNIPAQUE   IOHEXOL  300 MG/ML  SOLN COMPARISON:  CT 12/30/2023, 12/18/2023 FINDINGS: Lower chest: Lung bases demonstrate atelectasis or scarring at the bases. Cardiomegaly. Moderate pericardial effusion, increased compared to prior, measures up to 19 mm maximum thickness along the right cardiac border, and exhibits mild diffuse rim enhancement. Slight increased density values within the fluid. Hepatobiliary: Gallstone. No focal hepatic abnormality or biliary dilatation Pancreas: Unremarkable. No pancreatic ductal dilatation or surrounding inflammatory changes. Spleen: Normal in size without focal abnormality. Adrenals/Urinary Tract: Adrenal glands are normal. Atrophic kidneys without hydronephrosis. Left percutaneous nephrostomy catheter with decreased collecting system. Renal cysts for which no imaging follow-up is recommended. Thick-walled collapsed appearance of the urinary bladder with mild perivesical stranding. Stomach/Bowel: Stomach is moderately distended with fluid. Interim development of multiple dilated fluid-filled loops of proximal to mid small bowel, measuring up to 4.4 cm. Fecalized small bowel in the pelvis. Adhesed appearance of small-bowel loops to the anterior abdominal wall below the level of umbilicus. Decompressed small bowel in the region and remaining distal small bowel is decompressed. Patient is status post colectomy with right abdominal ileostomy. Small parastomal hernia contains fat and a knuckle of small bowel. Vascular/Lymphatic: Aortic atherosclerosis. No enlarged abdominal or pelvic lymph nodes. Reproductive: Slightly enlarged prostate Other: No free air. Left trans gluteal percutaneous drainage catheter since the prior exam with pigtail in the presacral region at the site of prior fluid collection. Residual soft tissue thickening in the region with small residual fluid inferior to the pigtail measuring about 14 x 16 mm on series 2, image 71. Musculoskeletal: No acute osseous abnormality. 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 Dialysis graft in the left thigh and groin with similar small fluid collection in the left groin measuring 19 mm, previously 19 mm. Small fluid collection measuring 14 mm adjacent to the lateral/arterial limb of the graft in the thigh, stable since 2024.  IMPRESSION: 1. Status post proctocolectomy with right abdominal ileostomy. Small parastomal hernia contains fat and a knuckle of small bowel. Interim development of moderate to marked fluid distension of the stomach with multiple dilated fluid-filled loops of proximal to mid small bowel and fecalized bowel in the pelvis consistent with small bowel obstruction. Transition point appears to be within the low anterior pelvis where there is adhesed appearance of multiple small bowel loops. 2. Left trans gluteal percutaneous drainage catheter with pigtail in the presacral region at the site of prior fluid collection. Residual soft tissue thickening in the region with small residual fluid collection inferior to the pigtail measuring about 14 x 16 mm. 3. Moderate pericardial effusion, increased compared to prior, exhibits mild diffuse rim enhancement. Slight increased density values within the fluid. Correlate for infection/pericarditis. 4. Gallstone. 5. Thick-walled collapsed appearance of the urinary bladder with mild perivesical stranding. 6. Left percutaneous nephrostomy catheter with no hydronephrosis. Atrophic native kidneys. Aortic Atherosclerosis (ICD10-I70.0). Electronically Signed   By: Luke Bun M.D.   On: 01/25/2024 20:40   DG Chest Port 1 View Result Date: 01/25/2024 CLINICAL DATA:  Concern for sepsis EXAM: PORTABLE CHEST 1 VIEW COMPARISON:  None Available. FINDINGS: 11/27/2023 stable enlarged cardiac silhouette. No effusion, infiltrate, pneumothorax. Mild scarring at LEFT lung base. RIGHT subclavian stent. IMPRESSION: 1. No acute cardiopulmonary process. 2. Cardiomegaly. Electronically Signed   By: Jackquline Boxer M.D.   On: 01/25/2024 19:36    {Document cardiac monitor, telemetry assessment procedure when appropriate:32947} Procedures   Medications Ordered in the ED  lactated ringers  infusion (has no administration in time range)  metroNIDAZOLE  (FLAGYL ) IVPB 500 mg (500 mg Intravenous New Bag/Given 01/25/24 2105)  vancomycin  (VANCOCIN ) IVPB 1000 mg/200 mL premix (has no administration in time range)  sodium chloride  0.9 % bolus 1,000 mL (0 mLs Intravenous Hold 01/25/24 2103)  fentaNYL  (SUBLIMAZE ) injection 50 mcg (has no administration in time range)  ondansetron  (ZOFRAN ) injection 4 mg (has no administration in time range)  albumin  human 25 % solution 25 g (has no administration in time range)  midodrine  (PROAMATINE ) tablet 10 mg (has no administration in time range)  sodium chloride  0.9 % bolus 1,000 mL (0 mLs Intravenous Stopped 01/25/24 2122)  aztreonam  (AZACTAM ) 2 g in sodium chloride  0.9 % 100 mL IVPB (0 g Intravenous Stopped 01/25/24 2101)  sodium chloride  0.9 % bolus 1,000 mL (1,000 mLs Intravenous New Bag/Given 01/25/24 2020)  iohexol  (OMNIPAQUE ) 300 MG/ML solution 100 mL (100 mLs Intravenous Contrast Given 01/25/24 2006)      {Click here for ABCD2, HEART and other calculators REFRESH Note before signing:1}                              Medical Decision Making Amount and/or Complexity of Data Reviewed Labs: ordered. Radiology: ordered.  Risk Prescription drug management. Decision regarding hospitalization.   This patient presents to the ED for concern of abd pain, this involves an extensive number of treatment options, and is a complaint that carries with it a high risk of complications and morbidity.  The differential diagnosis includes pelvic abscess, sbo, infection   Co morbidities that complicate the patient evaluation  ESRD on HD (MWF), hx of hypotension after dialysis (on midodrine ), anemia, neuropathy, hx DVT, afib (on Eliquis ), basal cell cancer, obstructive uropathy  s/p left PCN placement, UC s/p colectomy with ileostomy, and hx pelvic abscesses s/p pigtail placement   Additional history obtained:  Additional history  obtained from epic chart review  Lab Tests:  I Ordered, and personally interpreted labs.  The pertinent results include:  cbc nl other than hgb low at 10.2 (hgb 9.2 on 7/3); cmp with bun 33 and cr 6.02; lactic elevated at 2.6; inr 1.3   Imaging Studies ordered:  I ordered imaging studies including cxr and ct abd/pelvis I independently visualized and interpreted imaging which showed  CXR: No acute cardiopulmonary process.  2. Cardiomegaly.  CT abd/pelvis: . Status post proctocolectomy with right abdominal ileostomy. Small  parastomal hernia contains fat and a knuckle of small bowel. Interim  development of moderate to marked fluid distension of the stomach  with multiple dilated fluid-filled loops of proximal to mid small  bowel and fecalized bowel in the pelvis consistent with small bowel  obstruction. Transition point appears to be within the low anterior  pelvis where there is adhesed appearance of multiple small bowel  loops.  2. Left trans gluteal percutaneous drainage catheter with pigtail in  the presacral region at the site of prior fluid collection. Residual  soft tissue thickening in the region with small residual fluid  collection inferior to the pigtail measuring about 14 x 16 mm.  3. Moderate pericardial effusion, increased compared to prior,  exhibits mild diffuse rim enhancement. Slight increased density  values within the fluid. Correlate for infection/pericarditis.  4. Gallstone.  5. Thick-walled collapsed appearance of the urinary bladder with  mild perivesical stranding.  6. Left percutaneous nephrostomy catheter with no hydronephrosis.  Atrophic native kidneys.   I agree with the radiologist interpretation   Cardiac Monitoring:  The patient was maintained on a cardiac monitor.  I personally viewed and  interpreted the cardiac monitored which showed an underlying rhythm of: afib   Medicines ordered and prescription drug management:  I ordered medication including ivfs/abx  for sx  Reevaluation of the patient after these medicines showed that the patient improved I have reviewed the patients home medicines and have made adjustments as needed   Test Considered:  ct   Critical Interventions:  ivfs   Consultations Obtained:  I requested consultation with the surgeon (Dr. Dasie),  and discussed lab and imaging findings as well as pertinent plan - she will see pt in consult.  She recommends NG Pt d/w Dr. Lee (triad) for admission   Problem List / ED Course:  SBO:  gen surgery to consult Hypotension:  chronic after dialysis.  BP up to the upper 80s which is his normal. Possible sepsis:  ivfs and abx given.  Pt has very little output from his nephrostomy tube usually. Pericardial effusion:  no evidence of pericardial effusion on echo in may 2025   Reevaluation:  After the interventions noted above, I reevaluated the patient and found that they have :improved   Social Determinants of Health:  Lives at home   Dispostion:  After consideration of the diagnostic results and the patients response to treatment, I feel that the patent would benefit from admission.    CRITICAL CARE Performed by: Mliss Boyers   Total critical care time: 30 minutes  Critical care time was exclusive of separately billable procedures and treating other patients.  Critical care was necessary to treat or prevent imminent or life-threatening deterioration.  Critical care was time spent personally by me on the following activities: development of treatment plan with patient and/or surrogate as well as nursing, discussions with consultants, evaluation of patient's response to treatment, examination of patient, obtaining history from patient or surrogate,  ordering and performing treatments and  interventions, ordering and review of laboratory studies, ordering and review of radiographic studies, pulse oximetry and re-evaluation of patient's condition.   {Document critical care time when appropriate  Document review of labs and clinical decision tools ie [id number], etc  Document your independent review of radiology images and any outside records  Document your discussion with family members, caretakers and with consultants  Document social determinants of health affecting pt's care  Document your decision making why or why not admission, treatments were needed:32947:::1}   Final diagnoses:  SBO (small bowel obstruction) (HCC)  ESRD on hemodialysis (HCC)  Pericardial effusion  Sepsis without acute organ dysfunction, due to unspecified organism [facility])    ED Discharge Orders     None

## 2024-01-26 ENCOUNTER — Inpatient Hospital Stay (HOSPITAL_COMMUNITY)

## 2024-01-26 ENCOUNTER — Telehealth: Payer: Self-pay | Admitting: Family Medicine

## 2024-01-26 DIAGNOSIS — E872 Acidosis, unspecified: Secondary | ICD-10-CM | POA: Diagnosis present

## 2024-01-26 DIAGNOSIS — K651 Peritoneal abscess: Secondary | ICD-10-CM | POA: Diagnosis present

## 2024-01-26 DIAGNOSIS — I4891 Unspecified atrial fibrillation: Secondary | ICD-10-CM

## 2024-01-26 DIAGNOSIS — I3139 Other pericardial effusion (noninflammatory): Secondary | ICD-10-CM

## 2024-01-26 DIAGNOSIS — M7989 Other specified soft tissue disorders: Secondary | ICD-10-CM | POA: Diagnosis not present

## 2024-01-26 DIAGNOSIS — Z87891 Personal history of nicotine dependence: Secondary | ICD-10-CM | POA: Diagnosis not present

## 2024-01-26 DIAGNOSIS — R6521 Severe sepsis with septic shock: Secondary | ICD-10-CM | POA: Diagnosis present

## 2024-01-26 DIAGNOSIS — F32A Depression, unspecified: Secondary | ICD-10-CM | POA: Diagnosis present

## 2024-01-26 DIAGNOSIS — Z881 Allergy status to other antibiotic agents status: Secondary | ICD-10-CM | POA: Diagnosis not present

## 2024-01-26 DIAGNOSIS — E875 Hyperkalemia: Secondary | ICD-10-CM | POA: Diagnosis present

## 2024-01-26 DIAGNOSIS — J90 Pleural effusion, not elsewhere classified: Secondary | ICD-10-CM

## 2024-01-26 DIAGNOSIS — Z7951 Long term (current) use of inhaled steroids: Secondary | ICD-10-CM | POA: Diagnosis not present

## 2024-01-26 DIAGNOSIS — Z1152 Encounter for screening for COVID-19: Secondary | ICD-10-CM | POA: Diagnosis not present

## 2024-01-26 DIAGNOSIS — N186 End stage renal disease: Secondary | ICD-10-CM | POA: Diagnosis present

## 2024-01-26 DIAGNOSIS — E2740 Unspecified adrenocortical insufficiency: Secondary | ICD-10-CM | POA: Diagnosis present

## 2024-01-26 DIAGNOSIS — D631 Anemia in chronic kidney disease: Secondary | ICD-10-CM | POA: Diagnosis present

## 2024-01-26 DIAGNOSIS — K56609 Unspecified intestinal obstruction, unspecified as to partial versus complete obstruction: Secondary | ICD-10-CM | POA: Diagnosis present

## 2024-01-26 DIAGNOSIS — I4821 Permanent atrial fibrillation: Secondary | ICD-10-CM

## 2024-01-26 DIAGNOSIS — Z7901 Long term (current) use of anticoagulants: Secondary | ICD-10-CM | POA: Diagnosis not present

## 2024-01-26 DIAGNOSIS — Z992 Dependence on renal dialysis: Secondary | ICD-10-CM

## 2024-01-26 DIAGNOSIS — Z933 Colostomy status: Secondary | ICD-10-CM | POA: Diagnosis not present

## 2024-01-26 DIAGNOSIS — I482 Chronic atrial fibrillation, unspecified: Secondary | ICD-10-CM | POA: Diagnosis not present

## 2024-01-26 DIAGNOSIS — R7989 Other specified abnormal findings of blood chemistry: Secondary | ICD-10-CM

## 2024-01-26 DIAGNOSIS — I2489 Other forms of acute ischemic heart disease: Secondary | ICD-10-CM | POA: Diagnosis not present

## 2024-01-26 DIAGNOSIS — J449 Chronic obstructive pulmonary disease, unspecified: Secondary | ICD-10-CM | POA: Diagnosis present

## 2024-01-26 DIAGNOSIS — A419 Sepsis, unspecified organism: Secondary | ICD-10-CM

## 2024-01-26 DIAGNOSIS — Z85828 Personal history of other malignant neoplasm of skin: Secondary | ICD-10-CM | POA: Diagnosis not present

## 2024-01-26 DIAGNOSIS — I959 Hypotension, unspecified: Secondary | ICD-10-CM | POA: Diagnosis not present

## 2024-01-26 DIAGNOSIS — Z79899 Other long term (current) drug therapy: Secondary | ICD-10-CM | POA: Diagnosis not present

## 2024-01-26 DIAGNOSIS — E871 Hypo-osmolality and hyponatremia: Secondary | ICD-10-CM | POA: Diagnosis present

## 2024-01-26 DIAGNOSIS — M4628 Osteomyelitis of vertebra, sacral and sacrococcygeal region: Secondary | ICD-10-CM | POA: Diagnosis present

## 2024-01-26 LAB — CBC
HCT: 29.1 % — ABNORMAL LOW (ref 39.0–52.0)
Hemoglobin: 8.8 g/dL — ABNORMAL LOW (ref 13.0–17.0)
MCH: 36.2 pg — ABNORMAL HIGH (ref 26.0–34.0)
MCHC: 30.2 g/dL (ref 30.0–36.0)
MCV: 119.8 fL — ABNORMAL HIGH (ref 80.0–100.0)
Platelets: 156 K/uL (ref 150–400)
RBC: 2.43 MIL/uL — ABNORMAL LOW (ref 4.22–5.81)
RDW: 18.6 % — ABNORMAL HIGH (ref 11.5–15.5)
WBC: 8.6 K/uL (ref 4.0–10.5)
nRBC: 0 % (ref 0.0–0.2)

## 2024-01-26 LAB — CORTISOL: Cortisol, Plasma: 18.2 ug/dL

## 2024-01-26 LAB — LACTIC ACID, PLASMA
Lactic Acid, Venous: 1.6 mmol/L (ref 0.5–1.9)
Lactic Acid, Venous: 1.2 mmol/L (ref 0.5–1.9)
Lactic Acid, Venous: 1.5 mmol/L (ref 0.5–1.9)

## 2024-01-26 LAB — TROPONIN I (HIGH SENSITIVITY)
Troponin I (High Sensitivity): 24 ng/L — ABNORMAL HIGH (ref <18)
Troponin I (High Sensitivity): 23 ng/L — ABNORMAL HIGH (ref <18)

## 2024-01-26 LAB — PROCALCITONIN: Procalcitonin: 2.3 ng/mL

## 2024-01-26 LAB — C-REACTIVE PROTEIN: CRP: 4.6 mg/dL — ABNORMAL HIGH (ref <1.0)

## 2024-01-26 LAB — SEDIMENTATION RATE: Sed Rate: 80 mm/h — ABNORMAL HIGH (ref 0–16)

## 2024-01-26 LAB — CREATININE, SERUM
Creatinine, Ser: 6.3 mg/dL — ABNORMAL HIGH (ref 0.61–1.24)
GFR, Estimated: 8 mL/min — ABNORMAL LOW (ref >60)

## 2024-01-26 LAB — ECHOCARDIOGRAM COMPLETE
Height: 68 in
S' Lateral: 2.2 cm
Weight: 2617.3 [oz_av]

## 2024-01-26 LAB — MRSA NEXT GEN BY PCR, NASAL: MRSA by PCR Next Gen: NOT DETECTED

## 2024-01-26 LAB — MAGNESIUM: Magnesium: 1.9 mg/dL (ref 1.7–2.4)

## 2024-01-26 LAB — HEPATITIS B SURFACE ANTIGEN: Hepatitis B Surface Ag: NONREACTIVE

## 2024-01-26 LAB — TROPONIN T, HIGH SENSITIVITY: Troponin T High Sensitivity: 255 ng/L (ref <19)

## 2024-01-26 LAB — GLUCOSE, CAPILLARY
Glucose-Capillary: 119 mg/dL — ABNORMAL HIGH (ref 70–99)
Glucose-Capillary: 114 mg/dL — ABNORMAL HIGH (ref 70–99)

## 2024-01-26 MED ORDER — ENOXAPARIN SODIUM 40 MG/0.4ML IJ SOSY: 40.0000 mg | PREFILLED_SYRINGE | INTRAMUSCULAR | Status: DC

## 2024-01-26 MED ORDER — IPRATROPIUM-ALBUTEROL 0.5-2.5 (3) MG/3ML IN SOLN: 3.0000 mL | RESPIRATORY_TRACT | Status: DC | PRN

## 2024-01-26 MED ORDER — GABAPENTIN 300 MG PO CAPS: 300.0000 mg | ORAL_CAPSULE | Freq: Every day | ORAL | Status: DC

## 2024-01-26 MED ORDER — CHLORHEXIDINE GLUCONATE CLOTH 2 % EX PADS
6.0000 | MEDICATED_PAD | Freq: Every day | CUTANEOUS | Status: DC
  Administered 2024-01-28: 6 via TOPICAL

## 2024-01-26 MED ORDER — HYDROMORPHONE HCL 1 MG/ML IJ SOLN
1.0000 mg | INTRAMUSCULAR | Status: DC | PRN
  Administered 2024-01-26: 1 mg via INTRAVENOUS
  Filled 2024-01-26 (×2): qty 1

## 2024-01-26 MED ORDER — VANCOMYCIN VARIABLE DOSE PER UNSTABLE RENAL FUNCTION (PHARMACIST DOSING): Status: DC

## 2024-01-26 MED ORDER — SEVELAMER CARBONATE 800 MG PO TABS
1600.0000 mg | ORAL_TABLET | Freq: Three times a day (TID) | ORAL | Status: DC
  Filled 2024-01-26 (×2): qty 2

## 2024-01-26 MED ORDER — PANTOPRAZOLE SODIUM 40 MG PO TBEC: 40.0000 mg | DELAYED_RELEASE_TABLET | Freq: Two times a day (BID) | ORAL | Status: DC

## 2024-01-26 MED ORDER — NOREPINEPHRINE 4 MG/250ML-% IV SOLN
0.0000 ug/min | INTRAVENOUS | Status: DC
  Administered 2024-01-26: 2 ug/min via INTRAVENOUS
  Administered 2024-01-27: 3 ug/min via INTRAVENOUS
  Administered 2024-01-28: 7 ug/min via INTRAVENOUS
  Administered 2024-01-28: 8 ug/min via INTRAVENOUS
  Administered 2024-01-29 (×2): 7 ug/min via INTRAVENOUS
  Administered 2024-01-30: 4 ug/min via INTRAVENOUS
  Administered 2024-01-31: 3 ug/min via INTRAVENOUS
  Filled 2024-01-26 (×8): qty 250

## 2024-01-26 MED ORDER — HYDROCORTISONE SOD SUC (PF) 100 MG IJ SOLR
100.0000 mg | Freq: Three times a day (TID) | INTRAMUSCULAR | Status: DC
  Administered 2024-01-26 – 2024-01-27 (×3): 100 mg via INTRAVENOUS
  Filled 2024-01-26 (×3): qty 2

## 2024-01-26 MED ORDER — VANCOMYCIN HCL 500 MG/100ML IV SOLN
500.0000 mg | Freq: Once | INTRAVENOUS | Status: AC
  Administered 2024-01-26: 500 mg via INTRAVENOUS
  Filled 2024-01-26: qty 100

## 2024-01-26 MED ORDER — ALBUMIN HUMAN 25 % IV SOLN
25.0000 g | INTRAVENOUS | Status: AC
  Administered 2024-01-26: 25 g via INTRAVENOUS
  Filled 2024-01-26: qty 100

## 2024-01-26 MED ORDER — SODIUM CHLORIDE 0.9 % IV BOLUS
1000.0000 mL | Freq: Once | INTRAVENOUS | Status: AC
  Administered 2024-01-26: 1000 mL via INTRAVENOUS

## 2024-01-26 MED ORDER — APIXABAN 2.5 MG PO TABS: 2.5000 mg | ORAL_TABLET | Freq: Two times a day (BID) | ORAL | Status: DC

## 2024-01-26 MED ORDER — METRONIDAZOLE 500 MG/100ML IV SOLN
500.0000 mg | Freq: Two times a day (BID) | INTRAVENOUS | Status: DC
  Administered 2024-01-26 – 2024-01-27 (×2): 500 mg via INTRAVENOUS
  Filled 2024-01-26 (×2): qty 100

## 2024-01-26 MED ORDER — SODIUM CHLORIDE 0.9 % IV SOLN
250.0000 mL | INTRAVENOUS | Status: AC
  Administered 2024-01-26: 250 mL via INTRAVENOUS

## 2024-01-26 MED ORDER — TIMOLOL MALEATE 0.5 % OP SOLN
1.0000 [drp] | Freq: Two times a day (BID) | OPHTHALMIC | Status: DC
  Administered 2024-01-26 – 2024-02-03 (×17): 1 [drp] via OPHTHALMIC
  Filled 2024-01-26: qty 5

## 2024-01-26 MED ORDER — FLUDROCORTISONE ACETATE 0.1 MG PO TABS: 0.1000 mg | ORAL_TABLET | Freq: Every day | ORAL | Status: DC

## 2024-01-26 MED ORDER — RENA-VITE PO TABS: 1.0000 | ORAL_TABLET | Freq: Every day | ORAL | Status: DC

## 2024-01-26 MED ORDER — MIDODRINE HCL 5 MG PO TABS: 10.0000 mg | ORAL_TABLET | Freq: Three times a day (TID) | ORAL | Status: DC

## 2024-01-26 MED ORDER — SODIUM CHLORIDE 0.9 % IV SOLN
2.0000 g | INTRAVENOUS | Status: DC
  Administered 2024-01-26: 2 g via INTRAVENOUS
  Filled 2024-01-26: qty 10

## 2024-01-26 MED ORDER — FENTANYL CITRATE PF 50 MCG/ML IJ SOSY
50.0000 ug | PREFILLED_SYRINGE | INTRAMUSCULAR | Status: DC | PRN
  Administered 2024-01-26 (×5): 50 ug via INTRAVENOUS
  Filled 2024-01-26 (×6): qty 1

## 2024-01-26 MED ORDER — ACETAMINOPHEN 10 MG/ML IV SOLN
1000.0000 mg | Freq: Four times a day (QID) | INTRAVENOUS | Status: AC
  Administered 2024-01-26 – 2024-01-27 (×4): 1000 mg via INTRAVENOUS
  Filled 2024-01-26 (×5): qty 100

## 2024-01-26 MED ORDER — DORZOLAMIDE HCL 2 % OP SOLN
1.0000 [drp] | Freq: Every day | OPHTHALMIC | Status: DC
  Administered 2024-01-26 – 2024-02-02 (×8): 1 [drp] via OPHTHALMIC
  Filled 2024-01-26: qty 10

## 2024-01-26 MED ORDER — HEPARIN (PORCINE) 25000 UT/250ML-% IV SOLN
1450.0000 [IU]/h | INTRAVENOUS | Status: DC
  Administered 2024-01-26: 1050 [IU]/h via INTRAVENOUS
  Administered 2024-01-27 – 2024-01-28 (×2): 1200 [IU]/h via INTRAVENOUS
  Administered 2024-01-29: 1450 [IU]/h via INTRAVENOUS
  Filled 2024-01-26 (×4): qty 250

## 2024-01-26 MED ORDER — ALBUMIN HUMAN 25 % IV SOLN
25.0000 g | INTRAVENOUS | Status: AC | PRN
  Administered 2024-01-29 – 2024-02-02 (×2): 25 g via INTRAVENOUS

## 2024-01-26 MED ORDER — SERTRALINE HCL 50 MG PO TABS
25.0000 mg | ORAL_TABLET | Freq: Every day | ORAL | Status: DC
  Filled 2024-01-26: qty 1

## 2024-01-26 MED ORDER — HYDROMORPHONE HCL 1 MG/ML IJ SOLN
0.5000 mg | INTRAMUSCULAR | Status: DC | PRN
  Administered 2024-01-27 – 2024-01-28 (×4): 0.5 mg via INTRAVENOUS
  Filled 2024-01-26 (×4): qty 1

## 2024-01-26 MED ORDER — DIATRIZOATE MEGLUMINE & SODIUM 66-10 % PO SOLN
90.0000 mL | Freq: Once | ORAL | Status: AC
  Administered 2024-01-26: 90 mL via NASOGASTRIC
  Filled 2024-01-26: qty 90

## 2024-01-26 MED ORDER — MIDODRINE HCL 5 MG PO TABS: 20.0000 mg | ORAL_TABLET | Freq: Three times a day (TID) | ORAL | Status: DC

## 2024-01-26 MED ORDER — SODIUM CHLORIDE 0.9 % IV SOLN: INTRAVENOUS | Status: AC | PRN

## 2024-01-26 MED ORDER — BUDESONIDE 0.5 MG/2ML IN SUSP: 0.5000 mg | Freq: Two times a day (BID) | RESPIRATORY_TRACT | Status: DC | PRN

## 2024-01-26 MED ORDER — CALCITRIOL 0.5 MCG PO CAPS
0.5000 ug | ORAL_CAPSULE | ORAL | Status: DC
  Filled 2024-01-26: qty 1

## 2024-01-26 MED ORDER — PANTOPRAZOLE SODIUM 40 MG IV SOLR
40.0000 mg | INTRAVENOUS | Status: DC
  Administered 2024-01-26 – 2024-01-31 (×6): 40 mg via INTRAVENOUS
  Filled 2024-01-26 (×6): qty 10

## 2024-01-26 MED ORDER — FLUTICASONE FUROATE-VILANTEROL 100-25 MCG/ACT IN AEPB
1.0000 | INHALATION_SPRAY | Freq: Every day | RESPIRATORY_TRACT | Status: DC
  Administered 2024-01-27 – 2024-02-03 (×8): 1 via RESPIRATORY_TRACT
  Filled 2024-01-26: qty 28

## 2024-01-26 NOTE — Progress Notes (Signed)
 Echocardiogram 2D Echocardiogram has been performed.  Damien FALCON Lynix Bonine RDCS 01/26/2024, 10:01 AM

## 2024-01-26 NOTE — Consult Note (Signed)
 Cardiology Consultation   Patient ID: Joshua Riley MRN: [id number]; DOB: [birth year]  Admit date: 01/25/2024 Date of Consult: 01/26/2024  PCP:  Katrinka Garnette KIDD, MD   [facility] Providers Cardiologist:  Lynwood Schilling, MD     Patient Profile: Daune Colgate is an 88 y.o. male with a history of normal coronary arteries on cardiac catheterization in 07/2022, persistent atrial fibrillation on Eliquis , chronic hypotension on Midodrine , DVT, ESRD on hemodialysis, AAA, and ulcerative colitis status post colostomy in 2012 who is being seen 01/26/2024 for the evaluation of hypotension and pericardial effusion at the request of Dr. Sundil.  History of Present Illness: Mr. Guterrez is an 88 year old male with the above history.  Patient is primarily followed by Cardiology for persistent atrial fibrillation.  Cardiac catheterization in 07/2022 which showed normal coronary arteries.  He also has a history of chronic hypotension on Midodrine  high-dose Midodrine  (20 mg 3 times daily) and Florinef .  He has had multiple admissions in 2025 mostly for noncardiac issues such as sepsis secondary to infected groin abscess in 07/2023 small bowel obstruction and 09/2023.  He was admitted in early 11/2023 for chest pain.  Echo showed LVEF of 55-60% with no regional wall motion abnormalities. Pain was felt to be due to pericarditis he was treated with a short course of NSAIDs.  Of note, there was no pericardial effusion on echo that admission.  He was readmitted later that month for syncope was felt to be due to chronic issues of orthostasis and hypotension.  Hospitalization was also complicated by seizure-like activity.  MRI and EEG were unremarkable.  Neurology was consulted and felt that observed movements were typical convulsive syncope not epileptic.  [facility] on [facility]/25/2024 for further evaluation of abdominal pain and intractable nausea/vomiting.  He was noted to be  tachycardic and hypotensive on admission with systolic BP in the 70s.  High-sensitivity troponin 255. CT was consistent with a small bowel obstruction.  CT also incidentally showed a moderate pericardial effusion.  He was started on IV antibiotics and IV fluids and was admitted to [facility].  GI was consulted and NG tube placed was placed for decompression. Follow-up echocardiogram showed LVEF of 60-65% with mild asymmetric LVH, normal RV function, no significant valvular disease, and a moderate pericardial effusion without obvious RV compromise.  Cardiology was consulted to assist with management.  On interview this evening, he states that he is feeling better, no abdominal pain, no nausea or emesis.  NG tube in place.   Past Medical History:  Diagnosis Date   A-fib (HCC)    Anemia    Arthritis    Cancer (HCC)    Basal cell   COVID-19    2021   Dysrhythmia    Afib-controlled on eliquis    ESRD (end stage renal disease) (HCC) 10/22/2021   Glaucoma 11/18/2021   History of DVT (deep vein thrombosis)    Hydronephrosis    managed wtih a PCN   Idiopathic neuropathy 10/22/2021   lyrica     Ileostomy in place [facility])    Obstructive uropathy    With chronic left nephrostomy   Old retinal detachment, total or subtotal    Orthostatic hypotension 10/22/2021   Sleep apnea    does not need a machine   Stroke (HCC)    Ulcerative colitis (HCC)    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    Past Surgical History:  Procedure Laterality Date  BASAL CELL CARCINOMA EXCISION     10/23   COLON SURGERY     creation of j pouch     and subsequent takedown of j pouch   EYE SURGERY     IR CATHETER TUBE CHANGE  12/18/2023   IR NEPHROSTOMY EXCHANGE LEFT  12/10/2021   IR NEPHROSTOMY EXCHANGE LEFT  04/22/2022   IR NEPHROSTOMY EXCHANGE LEFT  07/29/2022   IR NEPHROSTOMY EXCHANGE LEFT  10/28/2022   IR NEPHROSTOMY EXCHANGE LEFT  02/05/2023   IR NEPHROSTOMY EXCHANGE LEFT  05/07/2023    IR NEPHROSTOMY EXCHANGE LEFT  06/25/2023   IR NEPHROSTOMY EXCHANGE LEFT  07/17/2023   IR NEPHROSTOMY EXCHANGE LEFT  10/22/2023   IR RADIOLOGIST EVAL & MGMT  11/25/2023   IR RADIOLOGIST EVAL & MGMT  12/18/2023   LEFT HEART CATH AND CORONARY ANGIOGRAPHY N/A 07/22/2022   Procedure: LEFT HEART CATH AND CORONARY ANGIOGRAPHY;  Surgeon: [place], Peter M, MD;  Location: [facility] INVASIVE CV LAB;  Service: Cardiovascular;  Laterality: N/A;   REVISION OF ARTERIOVENOUS GORETEX GRAFT Left 05/06/2022   Procedure: REDO LEFT THIGH ARTERIOVENOUS 4-7 MM GORETEX GRAFT;  Surgeon: Eliza Lonni RAMAN, MD;  Location: [facility] OR;  Service: Vascular;  Laterality: Left;   SMALL INTESTINE SURGERY     TOTAL COLECTOMY       Home Medications:  Prior to Admission medications   Medication Sig Start Date End Date Taking? Authorizing Provider  albuterol  (PROVENTIL ) (2.5 MG/3ML) 0.083% nebulizer solution Take 2.5 mg by nebulization every 6 (six) hours as needed for wheezing or shortness of breath.    [provider]  albuterol  (VENTOLIN  HFA) 108 (90 Base) MCG/ACT inhaler Inhale 1-2 puffs into the lungs every 6 (six) hours as needed for wheezing or shortness of breath.    [provider]  atorvastatin  (LIPITOR) 20 MG tablet Take 1 tablet (20 mg total) by mouth daily. 11/14/23   Sebastian Toribio GAILS, MD  budesonide  (PULMICORT ) 0.5 MG/2ML nebulizer solution Take 0.5 mg by nebulization 2 (two) times daily as needed (for respiratory flares).    [provider]  calcitRIOL  (ROCALTROL ) 0.5 MCG capsule Take 1 capsule (0.5 mcg total) by mouth every Monday, Wednesday, and Friday with hemodialysis. 12/04/23   Danford, Lonni SQUIBB, MD  Cyanocobalamin (VITAMIN B-12 PO) Take 1 capsule by mouth in the morning and at bedtime.    [provider]  dorzolamide  (TRUSOPT ) 2 % ophthalmic solution Place 1 drop into the right eye at bedtime. 06/17/23   [provider]  ELIQUIS  2.5 MG TABS tablet Take 1 tablet (2.5 mg  total) by mouth 2 (two) times daily. 08/07/22   Hobart Powell BRAVO, MD  fludrocortisone  (FLORINEF ) 0.1 MG tablet Take 1 tablet (0.1 mg total) by mouth daily. 12/04/23   Danford, Lonni SQUIBB, MD  folic acid  (FOLVITE ) 1 MG tablet Take 1 tablet (1 mg total) by mouth daily. 07/21/23   Regalado, Belkys A, MD  gabapentin  (NEURONTIN ) 300 MG capsule Take 300 mg by mouth at bedtime. 09/09/23   [provider]  loperamide  (IMODIUM ) 2 MG capsule Take 1 capsule (2 mg total) by mouth as needed for diarrhea or loose stools. 11/13/23   Sebastian Toribio GAILS, MD  midodrine  (PROAMATINE ) 10 MG tablet Take 2 tablets (20 mg total) by mouth 3 (three) times daily with meals. 12/22/23   Katrinka Garnette KIDD, MD  mometasone -formoterol  (DULERA ) 200-5 MCG/ACT AERO Inhale 2 puffs into the lungs 2 (two) times daily. 09/08/23   Rosario Leatrice FERNS, MD  multivitamin (RENA-VIT)  TABS tablet Take 1 tablet by mouth daily.    [provider]  Omega Fatty Acids-Vitamins (OMEGA-3 GUMMIES) CHEW Chew 1 tablet by mouth in the morning and at bedtime.    [provider]  pantoprazole  (PROTONIX ) 40 MG tablet Take 1 tablet (40 mg total) by mouth 2 (two) times daily. 11/13/23   Sebastian Toribio GAILS, MD  ramelteon  (ROZEREM ) 8 MG tablet Take 1 tablet (8 mg total) by mouth at bedtime. 10/27/23   Katrinka Garnette KIDD, MD  sertraline  (ZOLOFT ) 50 MG tablet Take 25 mg by mouth at bedtime.    [provider]  sevelamer  carbonate (RENVELA ) 800 MG tablet Take 1,600 mg by mouth 3 (three) times daily with meals.    [provider]  Sodium Chloride  Flush (SALINE FLUSH) 0.9 % SOLN Use 5 mLs by Intracatheter route daily as directed. 11/25/23   Caperilla, Marissa N, PA  timolol  (TIMOPTIC ) 0.5 % ophthalmic solution Place 1 drop into both eyes in the morning and at bedtime.    [provider]  traMADol  (ULTRAM ) 50 MG tablet Take 1 tablet (50 mg total) by mouth every 8 (eight) hours as needed (for pain). 12/03/23   Danford,  Lonni SQUIBB, MD  TYLENOL  325 MG tablet Take 325-650 mg by mouth every 6 (six) hours as needed for mild pain (pain score 1-3) or headache.    [provider]    Scheduled Meds:  [START ON 01/27/2024] calcitRIOL   0.5 mcg Per Tube Q M,W,F-HD   [START ON 01/27/2024] Chlorhexidine  Gluconate Cloth  6 each Topical Q0600   dorzolamide   1 drop Right Eye QHS   fluticasone  furoate-vilanterol  1 puff Inhalation Daily   multivitamin  1 tablet Per Tube Daily   sertraline   25 mg Per Tube QHS   sevelamer  carbonate  1,600 mg Per Tube TID WC   timolol   1 drop Both Eyes BID   vancomycin  variable dose per unstable renal function (pharmacist dosing)   Does not apply See admin instructions   Continuous Infusions:  acetaminophen  Stopped (01/26/24 1752)   [START ON 01/27/2024] albumin  human     aztreonam      heparin  1,050 Units/hr (01/26/24 1800)   vancomycin      PRN Meds: [START ON 01/27/2024] albumin  human, HYDROmorphone  (DILAUDID ) injection, ipratropium-albuterol   Allergies:    Allergies  Allergen Reactions   Baclofen Other (See Comments)    Altered mental status, after accidental overdose     Cephalosporins Rash   Ciprofloxacin Itching and Rash    Social History:   Social History   Tobacco Use   Smoking status: Former    Current packs/day: 0.00    Average packs/day: 2.0 packs/day for 6.0 years (12.0 ttl pk-yrs)    Types: Cigarettes    Start date: 43    Quit date: 17    Years since quitting: 40.5    Passive exposure: Never   Smokeless tobacco: Never  Substance Use Topics   Alcohol use: Yes    Alcohol/week: 5.0 standard drinks of alcohol    Types: 5 Shots of liquor per week    Comment: socially     Family History:   Family History  Problem Relation Age of Onset   Stroke Mother    Cancer Father    Esophageal cancer Brother      ROS:  Please see the history of present illness.   Physical Exam/Data: Vitals:   01/26/24 1700 01/26/24 1752 01/26/24 1900 01/26/24  2009  BP: (!) 89/58  ROLLEN)  75/63 (!) 67/53  Pulse: 80  86 86  Resp: (!) 21  12 18   Temp:      TempSrc:      SpO2: (!) 88% 94% 92% 95%  Weight:      Height:        Intake/Output Summary (Last 24 hours) at 01/26/2024 2019 Last data filed at 01/26/2024 1800 Gross per 24 hour  Intake 4610.69 ml  Output 800 ml  Net 3810.69 ml      01/26/2024    7:03 AM 01/25/2024    6:49 PM 01/21/2024    1:14 PM  Last 3 Weights  Weight (lbs) 163 lb 9.3 oz 160 lb 158 lb 3.2 oz  Weight (kg) 74.2 kg 72.576 kg 71.759 kg     Body mass index is 24.87 kg/m.  GEN: Patient is in no distress.  NG tube in place. HEENT: Conjunctiva and lids normal. Neck: Supple, no elevated JVP or carotid bruits. Lungs: Clear to auscultation, nonlabored breathing at rest. Cardiac: Regular rate and rhythm, no S3, soft systolic murmur, no pericardial rub. Abdomen: Soft, bowel sounds present. Extremities: Mild pretibial edema.  Left thigh AV graft with bruit. Skin: Warm and dry. Musculoskeletal: No kyphosis. Neuropsychiatric: Alert and oriented x3, affect grossly appropriate.   EKG:  The EKG was personally reviewed and demonstrates:  Atrial fibrillation with right bundle branch block, rate 120 bpm. Telemetry:  Telemetry was personally reviewed and demonstrates: Atrial fibrillation.  Relevant CV Studies:  Left Cardiac Catheterization 07/22/2022:   The left ventricular systolic function is normal.   LV end diastolic pressure is normal.   The left ventricular ejection fraction is 55-65% by visual estimate.   No significant CAD Normal LV function Normal LVEDP   Plan: medical management  Diagnostic Dominance: Right    _______________  Echocardiogram 01/26/2024: Impressions: 1. Left ventricular ejection fraction, by estimation, is 60 to 65%. The  left ventricle has normal function. The left ventricle has no regional  wall motion abnormalities. There is mild asymmetric left ventricular  hypertrophy of the septal  segment. Left  ventricular diastolic parameters are indeterminate.   2. Right ventricular systolic function is normal. The right ventricular  size is normal. There is normal pulmonary artery systolic pressure. The  estimated right ventricular systolic pressure is 33.0 mmHg.   3. Moderate pericardial effusion. The pericardial effusion is  circumferential. No evidence of right ventricular collapse or right atrial  collapse.   4. The mitral valve is degenerative. Trivial mitral valve regurgitation.   5. The aortic valve has an indeterminant number of cusps. Aortic valve  regurgitation is trivial. Aortic valve sclerosis is present, with no  evidence of aortic valve stenosis.   6. The inferior vena cava is dilated in size with <50% respiratory  variability, suggesting right atrial pressure of 15 mmHg.   Comparison(s): Prior images reviewed side by side. New pericardial  effusion.    Laboratory Data: Chemistry Recent Labs  Lab 01/25/24 1854 01/26/24 0944  NA 142  --   K 4.1  --   CL 98  --   CO2 26  --   GLUCOSE 154*  --   BUN 33*  --   CREATININE 6.02* 6.30*  CALCIUM  9.8  --   GFRNONAA 9* 8*  ANIONGAP 18*  --     Recent Labs  Lab 01/25/24 1854  PROT 7.9  ALBUMIN  4.4  AST 28  ALT 27  ALKPHOS 144*  BILITOT 0.7   Hematology Recent Labs  Lab  01/25/24 1854 01/26/24 0944  WBC 11.0* 8.6  RBC 2.79* 2.43*  HGB 10.2* 8.8*  HCT 33.2* 29.1*  MCV 119.0* 119.8*  MCH 36.6* 36.2*  MCHC 30.7 30.2  RDW 18.8* 18.6*  PLT 233 156   Radiology/Studies:  DG Abd Portable 1V-Small Bowel Protocol-Position Verification Result Date: 01/26/2024 CLINICAL DATA:  Nasogastric tube placement. EXAM: PORTABLE ABDOMEN - 1 VIEW COMPARISON:  January 25, 2024. FINDINGS: Distal tip of nasogastric tube is seen in expected position of distal stomach. Left-sided nephrostomy is again noted. IMPRESSION: Nasogastric tube tip seen in expected position of distal stomach. Electronically Signed   By: Lynwood Landy Raddle M.D.   On: 01/26/2024 13:36   ECHOCARDIOGRAM COMPLETE Result Date: 01/26/2024    ECHOCARDIOGRAM REPORT   Patient Name:   Joshua Riley Date of Exam: 01/26/2024 Medical Rec #:  [id number]       Height:       68.0 in Accession #:    [id number]      Weight:       163.6 lb Date of Birth:  02-25-1936       BSA:          1.876 m Patient Age:    87 years        BP:           98/67 mmHg Patient Gender: M               HR:           86 bpm. Exam Location:  Inpatient Procedure: 2D Echo, Color Doppler and Cardiac Doppler (Both Spectral and Color            Flow Doppler were utilized during procedure). Indications:    I31.3 Pericardial effusion  History:        Patient has prior history of Echocardiogram examinations, most                 recent 11/10/2023. COPD, Arrythmias:Atrial Fibrillation; Risk                 Factors:Sleep Apnea.  Sonographer:    Damien Senior RDCS Referring Phys: [id number] SUBRINA SUNDIL IMPRESSIONS  1. Left ventricular ejection fraction, by estimation, is 60 to 65%. The left ventricle has normal function. The left ventricle has no regional wall motion abnormalities. There is mild asymmetric left ventricular hypertrophy of the septal segment. Left ventricular diastolic parameters are indeterminate.  2. Right ventricular systolic function is normal. The right ventricular size is normal. There is normal pulmonary artery systolic pressure. The estimated right ventricular systolic pressure is 33.0 mmHg.  3. Moderate pericardial effusion. The pericardial effusion is circumferential. No evidence of right ventricular collapse or right atrial collapse.  4. The mitral valve is degenerative. Trivial mitral valve regurgitation.  5. The aortic valve has an indeterminant number of cusps. Aortic valve regurgitation is trivial. Aortic valve sclerosis is present, with no evidence of aortic valve stenosis.  6. The inferior vena cava is dilated in size with <50% respiratory variability, suggesting right atrial pressure  of 15 mmHg. Comparison(s): Prior images reviewed side by side. New pericardial effusion. FINDINGS  Left Ventricle: Left ventricular ejection fraction, by estimation, is 60 to 65%. The left ventricle has normal function. The left ventricle has no regional wall motion abnormalities. The left ventricular internal cavity size was normal in size. There is  mild asymmetric left ventricular hypertrophy of the septal segment. Left ventricular diastolic function could not be evaluated due to atrial fibrillation.  Left ventricular diastolic parameters are indeterminate. Right Ventricle: The right ventricular size is normal. No increase in right ventricular wall thickness. Right ventricular systolic function is normal. There is normal pulmonary artery systolic pressure. The tricuspid regurgitant velocity is 2.12 m/s, and  with an assumed right atrial pressure of 15 mmHg, the estimated right ventricular systolic pressure is 33.0 mmHg. Left Atrium: Left atrial size was normal in size. Right Atrium: Right atrial size was normal in size. Pericardium: A moderately sized pericardial effusion is present. The pericardial effusion is circumferential. The pericardial effusion appears to contain fibrous material. Mitral Valve: The mitral valve is degenerative in appearance. Trivial mitral valve regurgitation. Tricuspid Valve: The tricuspid valve is normal in structure. Tricuspid valve regurgitation is mild. Aortic Valve: The aortic valve has an indeterminant number of cusps. Aortic valve regurgitation is trivial. Aortic valve sclerosis is present, with no evidence of aortic valve stenosis. Pulmonic Valve: The pulmonic valve was normal in structure. Pulmonic valve regurgitation is not visualized. Aorta: The aortic root is normal in size and structure and the ascending aorta was not well visualized. Venous: The inferior vena cava is dilated in size with less than 50% respiratory variability, suggesting right atrial pressure of 15 mmHg.  IAS/Shunts: No atrial level shunt detected by color flow Doppler.  LEFT VENTRICLE PLAX 2D LVIDd:         3.10 cm LVIDs:         2.20 cm LV PW:         0.90 cm LV IVS:        1.10 cm LVOT diam:     1.90 cm LV SV:         42 LV SV Index:   22 LVOT Area:     2.84 cm  RIGHT VENTRICLE RV S prime:     9.64 cm/s TAPSE (M-mode): 1.7 cm LEFT ATRIUM             Index        RIGHT ATRIUM           Index LA diam:        3.70 cm 1.97 cm/m   RA Area:     21.30 cm LA Vol (A2C):   52.1 ml 27.77 ml/m  RA Volume:   57.80 ml  30.80 ml/m LA Vol (A4C):   61.2 ml 32.62 ml/m LA Biplane Vol: 58.9 ml 31.39 ml/m  AORTIC VALVE LVOT Vmax:   75.00 cm/s LVOT Vmean:  57.100 cm/s LVOT VTI:    0.147 m  AORTA Ao Root diam: 3.70 cm TRICUSPID VALVE TR Peak grad:   18.0 mmHg TR Vmax:        212.00 cm/s  SHUNTS Systemic VTI:  0.15 m Systemic Diam: 1.90 cm Stanly Leavens MD Electronically signed by Stanly Leavens MD Signature Date/Time: 01/26/2024/12:27:36 PM    Final    DG Abdomen 1 View Result Date: 01/25/2024 CLINICAL DATA:  NG tube placement. EXAM: ABDOMEN - 1 VIEW COMPARISON:  September 07, 2023 FINDINGS: A nasogastric tube is seen with its distal tip overlying the expected region of the gastric antrum. The bowel gas pattern is normal. A left-sided percutaneous of proximally tube is in place. No radio-opaque calculi or other significant radiographic abnormality are seen. IMPRESSION: Nasogastric tube positioning, as described above. Electronically Signed   By: Suzen Dials M.D.   On: 01/25/2024 23:06   CT ABDOMEN PELVIS W CONTRAST Result Date: 01/25/2024 CLINICAL DATA:  Pain nausea reduced ostomy output EXAM: CT ABDOMEN  AND PELVIS WITH CONTRAST TECHNIQUE: Multidetector CT imaging of the abdomen and pelvis was performed using the standard protocol following bolus administration of intravenous contrast. RADIATION DOSE REDUCTION: This exam was performed according to the departmental dose-optimization program which includes  automated exposure control, adjustment of the mA and/or kV according to patient size and/or use of iterative reconstruction technique. CONTRAST:  OMNIPAQUE  IOHEXOL  300 MG/ML  SOLN COMPARISON:  CT 12/30/2023, 12/18/2023 FINDINGS: Lower chest: Lung bases demonstrate atelectasis or scarring at the bases. Cardiomegaly. Moderate pericardial effusion, increased compared to prior, measures up to 19 mm maximum thickness along the right cardiac border, and exhibits mild diffuse rim enhancement. Slight increased density values within the fluid. Hepatobiliary: Gallstone. No focal hepatic abnormality or biliary dilatation Pancreas: Unremarkable. No pancreatic ductal dilatation or surrounding inflammatory changes. Spleen: Normal in size without focal abnormality. Adrenals/Urinary Tract: Adrenal glands are normal. Atrophic kidneys without hydronephrosis. Left percutaneous nephrostomy catheter with decreased collecting system. Renal cysts for which no imaging follow-up is recommended. Thick-walled collapsed appearance of the urinary bladder with mild perivesical stranding. Stomach/Bowel: Stomach is moderately distended with fluid. Interim development of multiple dilated fluid-filled loops of proximal to mid small bowel, measuring up to 4.4 cm. Fecalized small bowel in the pelvis. Adhesed appearance of small-bowel loops to the anterior abdominal wall below the level of umbilicus. Decompressed small bowel in the region and remaining distal small bowel is decompressed. Patient is status post colectomy with right abdominal ileostomy. Small parastomal hernia contains fat and a knuckle of small bowel. Vascular/Lymphatic: Aortic atherosclerosis. No enlarged abdominal or pelvic lymph nodes. Reproductive: Slightly enlarged prostate Other: No free air. Left trans gluteal percutaneous drainage catheter since the prior exam with pigtail in the presacral region at the site of prior fluid collection. Residual soft tissue thickening in the  region with small residual fluid inferior to the pigtail measuring about 14 x 16 mm on series 2, image 71. Musculoskeletal: No acute osseous abnormality. 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 Dialysis graft in the left thigh and groin with similar small fluid collection in the left groin measuring 19 mm, previously 19 mm. Small fluid collection measuring 14 mm adjacent to the lateral/arterial limb of the graft in the thigh, stable since 2024. IMPRESSION: 1. Status post proctocolectomy with right abdominal ileostomy. Small parastomal hernia contains fat and a knuckle of small bowel. Interim development of moderate to marked fluid distension of the stomach with multiple dilated fluid-filled loops of proximal to mid small bowel and fecalized bowel in the pelvis consistent with small bowel obstruction. Transition point appears to be within the low anterior pelvis where there is adhesed appearance of multiple small bowel loops. 2. Left trans gluteal percutaneous drainage catheter with pigtail in the presacral region at the site of prior fluid collection. Residual soft tissue thickening in the region with small residual fluid collection inferior to the pigtail measuring about 14 x 16 mm. 3. Moderate pericardial effusion, increased compared to prior, exhibits mild diffuse rim enhancement. Slight increased density values within the fluid. Correlate for infection/pericarditis. 4. Gallstone. 5. Thick-walled collapsed appearance of the urinary bladder with mild perivesical stranding. 6. Left percutaneous nephrostomy catheter with no hydronephrosis. Atrophic native kidneys. Aortic Atherosclerosis (ICD10-I70.0). Electronically Signed   By: Luke Bun M.D.   On: 01/25/2024 20:40   DG Chest Port 1 View Result Date: 01/25/2024 CLINICAL DATA:  Concern for sepsis EXAM: PORTABLE CHEST 1 VIEW COMPARISON:  None Available. FINDINGS: 11/27/2023 stable enlarged cardiac silhouette. No effusion,  infiltrate,  pneumothorax. Mild scarring at LEFT lung base. RIGHT subclavian stent. IMPRESSION: 1. No acute cardiopulmonary process. 2. Cardiomegaly. Electronically Signed   By: Jackquline Boxer M.D.   On: 01/25/2024 19:36    Assessment and Plan:  1.  Acute on chronic hypotension, likely multifactorial.  As an outpatient he is on Florinef  0.1 mg daily and ProAmatine  20 mg 3 times a day (not receiving consistently now in the setting of SBO).  There is concern for sepsis as well and he does have a moderate circumferential pericardial effusion with evidence of organization anteriorly, although no definite RV compromise to suggest this is a clear contributor at this time (no clear indication for pericardiocentesis).  Pericardial effusion is new in comparison to his last echocardiogram and he does have a history of pericarditis as well as ESRD.  Recent systolics of 70s to 80s.  He is receiving supportive measures as well as treatment for sepsis.  2.  Mild elevation in high-sensitivity troponin I at 255.  This is most consistent with demand ischemia particularly in light of documentation of normal coronary arteries at cardiac catheterization in January 2024 and current comorbidities.  3.  Persistent/permanent atrial fibrillation with CHA2DS2-VASc score of 5.  Typically on Eliquis  as an outpatient, transitioned to IV heparin  by primary team at this time.  Initially with RVR however recent heart rates in the 80s to 90s.  Currently not on AV nodal blockers.  We will continue to follow with you.  May want to consider follow-up limited echocardiogram in the next 48 to 72 hours depending on blood pressure stability, mainly to ensure that pericardial effusion is not increasing in size.  Would treat other comorbidities and resume chronic supportive measures for blood pressure as able.  Do not anticipate further ischemic testing.  For questions or updates, please contact [facility] Please consult [email or web address] for  contact info under    Signed, Jayson Sierras, MD  01/26/2024 8:19 PM

## 2024-01-26 NOTE — Consult Note (Signed)
 NAME:  Joshua Riley, MRN:  [id number], DOB:  05-Feb-1936, LOS: 0 ADMISSION DATE:  01/25/2024, CONSULTATION DATE:  7/22 REFERRING MD:  Dr. Lee, CHIEF COMPLAINT:  hypotension; sbo   History of Present Illness:  Patient is a 88 year old male with pertinent PMH ESRD on HD MWF, dCHF, A-fib on Eliquis , ulcerative colitis w/ ileostomy, CVA, chronic sacral osteomyelitis (recently completed ertapenem  and vanc), orthostatic hypotension presents to [facility] on 7/22 with SBO.  Patient having abd on 7/21 that progressively worsened w/ N/V came to [facility] ED later that night. On arrival patient tachycardic and hypotensive. Patient afebrile and wbc 11.  Cultures obtained, given iv fluids, started on aztreonam  and vanc. LA 2.6 then 1.6. PCT 2.3. CXR no acute findings. Covid/flu/rsv negative. CT abd/pelvis w/ small parastomal hernia contains fat and a knuckle of small bowel; Interim development of moderate to marked fluid distension of the stomach with multiple dilated fluid-filled loops of proximal to mid small bowel and fecalized bowel in the pelvis consistent with small bowel obstruction; moderate pericardial effusion. Surgery consulted. NG tube placed and made npo. Trop 250. Echo lvef 60-65%; moderate pericardial effusion; RA pressure 15. Cards consulted. On 7/22 patient remained hypotensive despite iv fluids. Unable to give home midodrine /florinef  while npo. PCCM consulted.   Pertinent  Medical History   Past Medical History:  Diagnosis Date   A-fib (HCC)    Anemia    Arthritis    Cancer (HCC)    Basal cell   COVID-19    2021   Dysrhythmia    Afib-controlled on eliquis    ESRD (end stage renal disease) (HCC) 10/22/2021   Glaucoma 11/18/2021   History of DVT (deep vein thrombosis)    Hydronephrosis    managed wtih a PCN   Idiopathic neuropathy 10/22/2021   lyrica     Ileostomy in place [facility])    Obstructive uropathy    With chronic left nephrostomy   Old retinal detachment, total or subtotal     Orthostatic hypotension 10/22/2021   Sleep apnea    does not need a machine   Stroke (HCC)    Ulcerative colitis (HCC)    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 rate (!) 21, height 5' 8 (1.727 m), weight 74.2 kg, SpO2 94%.        Intake/Output Summary (Last 24 hours) at 01/26/2024 1903 Last data filed at 01/26/2024 1800 Gross per 24 hour  Intake 4610.69 ml  Output 800 ml  Net 3810.69 ml   Filed Weights   01/25/24 1849 01/26/24 0703  Weight: 72.6 kg 74.2 kg    Examination: General:  NAD HEENT: MM pink/moist; [place] and ng tube in place Neuro: Aox3; perrl CV: s1s2, irregularly irregular rate 70-80s, no m/r/g PULM:  dim clear BS bilaterally; [place] 4L sats 97% GI: soft; bs absent; ileostomy in place Extremities: warm/dry, RLE slightly more swelling and some erythema than LLE; left femoral fistula w/ no drainage/erythema   Resolved problem list   Assessment and Plan   Shock: presume septic Hx of orthostatic hypotension on midodrine  and  florinef  -hx of chronic sacral osteomyelitis (recently completed ertapenem  and vanc) Plan: -transfer to icu -start levo for map goal >65 -cont aztreonam /vanc; add flagyl  -follow cultures -getting albumin  -cortisol; start on stress dose steroids -unable to resume home midodrine /florinef  while npo  SBO Hx of ulcerative colitis w/ ileostomy Plan: -surgery following; appreciate recs -NPO -NG to LIWS -pain management  ESRD on HD Plan: -nephro following -if worsening pressor  requirements may end up needing HD cath placement for CRRT rather than HD -Trend BMP -Replace electrolytes as indicated -Avoid nephrotoxic agents, ensure adequate renal perfusion  A-fib on Eliquis  dCHF Plan: -heparin  gtt -tele monitoring -trend electrolytes and replete as needed -daily weights; strict I/o's  Anemia of chronic disease Plan: -trend cbc  Moderate pericardial effusion Elevated Troponin: likely demand ischemia Plan: -cards following; appreciate recs -no indication for pericardiocentesis per cards -trend trop  RLE swelling -also with some warmth/erythema (cellulitis?) Plan: -dvt us   Hx of CVA Plan: -hold statin while npo  Hx of copd -on dulera  Plan: -wean [place] for sats >92% -breo-ellipta; prn duoneb  Depression Plan: -hold home zoloft   Peripheral neuropathy Plan: -hold gaba while npo  Best Practice (right click and Reselect all SmartList Selections daily)   Diet/type: NPO DVT prophylaxis systemic heparin  Pressure ulcer(s): pressure ulcer assessment deferred  GI prophylaxis: N/A Lines: N/A Foley:  N/A Code Status:  full code Last date of multidisciplinary goals of care discussion [7/22 patient updated at bedside]  Labs   CBC: Recent Labs  Lab 01/25/24 1854 01/26/24 0944  WBC 11.0* 8.6  NEUTROABS 9.6*  --   HGB 10.2* 8.8*  HCT 33.2* 29.1*  MCV 119.0* 119.8*  PLT 233 156    Basic Metabolic Panel: Recent Labs  Lab 01/25/24 1854 01/26/24 0944  NA 142  --   K 4.1  --   CL 98  --   CO2 26  --   GLUCOSE 154*  --   BUN 33*  --   CREATININE 6.02* 6.30*  CALCIUM  9.8  --    GFR: Estimated Creatinine Clearance: 8.3 mL/min (A) (by C-G formula based on SCr of 6.3 mg/dL (H)). Recent Labs  Lab 01/25/24 1854 01/25/24 1907 01/25/24 2101 01/26/24 0944 01/26/24 1650  PROCALCITON  --   --   --  2.30  --   WBC 11.0*  --   --  8.6  --   LATICACIDVEN  --  2.6* 1.6  --  1.6    Liver Function Tests: Recent Labs  Lab 01/25/24 1854   AST 28  ALT 27  ALKPHOS 144*  BILITOT 0.7  PROT 7.9  ALBUMIN  4.4   No results for input(s): LIPASE, AMYLASE in the last 168 hours. No results for input(s): AMMONIA in the last 168 hours.  ABG    Component Value Date/Time   TCO2 25 05/06/2022 0621     Coagulation Profile: Recent Labs  Lab 01/25/24 1854  INR 1.3*    Cardiac Enzymes: No results for input(s): CKTOTAL, CKMB, CKMBINDEX, TROPONINI in the last 168 hours.  HbA1C: Hgb A1c MFr Bld  Date/Time Value Ref Range Status  11/09/2023 09:52 PM 5.9 (H) 4.8 - 5.6 % Final    Comment:    (NOTE) Pre diabetes:          5.7%-6.4%  Diabetes:              >6.4%  Glycemic control for   <7.0% adults with diabetes     CBG: No results for input(s): GLUCAP  in the last 168 hours.  Review of Systems:   Review of Systems  Constitutional:  Negative for fever.  Respiratory:  Negative for shortness of breath.   Cardiovascular:  Negative for chest pain.  Gastrointestinal:  Positive for abdominal pain, nausea and vomiting.     Past Medical History:  He,  has a past medical history of A-fib (HCC), Anemia, Arthritis, Cancer (HCC), COVID-19, Dysrhythmia, ESRD (end stage renal disease) (HCC) (10/22/2021), Glaucoma (11/18/2021), History of DVT (deep vein thrombosis), Hydronephrosis, Idiopathic neuropathy (10/22/2021), Ileostomy in place [facility]), Obstructive uropathy, Old retinal detachment, total or subtotal, Orthostatic hypotension (10/22/2021), Sleep apnea, Stroke (HCC), Ulcerative colitis (HCC), and Ureteral stricture.   Surgical History:   Past Surgical History:  Procedure Laterality Date   BASAL CELL CARCINOMA EXCISION     10/23   COLON SURGERY     creation of j pouch     and subsequent takedown of j pouch   EYE SURGERY     IR CATHETER TUBE CHANGE  12/18/2023   IR NEPHROSTOMY EXCHANGE LEFT  12/10/2021   IR NEPHROSTOMY EXCHANGE LEFT  04/22/2022   IR NEPHROSTOMY EXCHANGE LEFT  07/29/2022   IR NEPHROSTOMY  EXCHANGE LEFT  10/28/2022   IR NEPHROSTOMY EXCHANGE LEFT  02/05/2023   IR NEPHROSTOMY EXCHANGE LEFT  05/07/2023   IR NEPHROSTOMY EXCHANGE LEFT  06/25/2023   IR NEPHROSTOMY EXCHANGE LEFT  07/17/2023   IR NEPHROSTOMY EXCHANGE LEFT  10/22/2023   IR RADIOLOGIST EVAL & MGMT  11/25/2023   IR RADIOLOGIST EVAL & MGMT  12/18/2023   LEFT HEART CATH AND CORONARY ANGIOGRAPHY N/A 07/22/2022   Procedure: LEFT HEART CATH AND CORONARY ANGIOGRAPHY;  Surgeon: [place], Peter M, MD;  Location: [facility] INVASIVE CV LAB;  Service: Cardiovascular;  Laterality: N/A;   REVISION OF ARTERIOVENOUS GORETEX GRAFT Left 05/06/2022   Procedure: REDO LEFT THIGH ARTERIOVENOUS 4-7 MM GORETEX GRAFT;  Surgeon: Eliza Lonni RAMAN, MD;  Location: [facility] OR;  Service: Vascular;  Laterality: Left;   SMALL INTESTINE SURGERY     TOTAL COLECTOMY       Social History:   reports that he quit smoking about 40 years ago. His smoking use included cigarettes. He started smoking about 46 years ago. He has a 12 pack-year smoking history. He has never been exposed to tobacco smoke. He has never used smokeless tobacco. He reports current alcohol use of about 5.0 standard drinks of alcohol per week. He reports that he does not use drugs.   Family History:  His family history includes Cancer in his father; Esophageal cancer in his brother; Stroke in his mother.   Allergies Allergies  Allergen Reactions   Baclofen Other (See Comments)    Altered mental status, after accidental overdose     Cephalosporins Rash   Ciprofloxacin Itching and Rash     Home Medications  Prior to Admission medications   Medication Sig Start Date End Date Taking? Authorizing Provider  albuterol  (PROVENTIL ) (2.5 MG/3ML) 0.083% nebulizer solution Take 2.5 mg by nebulization every 6 (six) hours as needed for wheezing or shortness of breath.    [provider]  albuterol  (VENTOLIN  HFA) 108 (90 Base) MCG/ACT inhaler Inhale 1-2 puffs into the lungs every 6 (six) hours as  needed for wheezing or shortness of breath.    [provider]  atorvastatin  (LIPITOR) 20 MG tablet Take 1 tablet (20 mg total) by mouth daily. 11/14/23   Sebastian Toribio GAILS, MD  budesonide  (PULMICORT ) 0.5 MG/2ML nebulizer solution Take 0.5 mg by nebulization  2 (two) times daily as needed (for respiratory flares).    [provider]  calcitRIOL  (ROCALTROL ) 0.5 MCG capsule Take 1 capsule (0.5 mcg total) by mouth every Monday, Wednesday, and Friday with hemodialysis. 12/04/23   Danford, Lonni SQUIBB, MD  Cyanocobalamin (VITAMIN B-12 PO) Take 1 capsule by mouth in the morning and at bedtime.    [provider]  dorzolamide  (TRUSOPT ) 2 % ophthalmic solution Place 1 drop into the right eye at bedtime. 06/17/23   [provider]  ELIQUIS  2.5 MG TABS tablet Take 1 tablet (2.5 mg total) by mouth 2 (two) times daily. 08/07/22   Hobart Powell BRAVO, MD  fludrocortisone  (FLORINEF ) 0.1 MG tablet Take 1 tablet (0.1 mg total) by mouth daily. 12/04/23   Danford, Lonni SQUIBB, MD  folic acid  (FOLVITE ) 1 MG tablet Take 1 tablet (1 mg total) by mouth daily. 07/21/23   Regalado, Belkys A, MD  gabapentin  (NEURONTIN ) 300 MG capsule Take 300 mg by mouth at bedtime. 09/09/23   [provider]  loperamide  (IMODIUM ) 2 MG capsule Take 1 capsule (2 mg total) by mouth as needed for diarrhea or loose stools. 11/13/23   Sebastian Toribio GAILS, MD  midodrine  (PROAMATINE ) 10 MG tablet Take 2 tablets (20 mg total) by mouth 3 (three) times daily with meals. 12/22/23   Katrinka Garnette KIDD, MD  mometasone -formoterol  (DULERA ) 200-5 MCG/ACT AERO Inhale 2 puffs into the lungs 2 (two) times daily. 09/08/23   Rosario Eland I, MD  multivitamin (RENA-VIT) TABS tablet Take 1 tablet by mouth daily.    [provider]  Omega Fatty Acids-Vitamins (OMEGA-3 GUMMIES) CHEW Chew 1 tablet by mouth in the morning and at bedtime.    [provider]  pantoprazole  (PROTONIX ) 40 MG tablet Take 1 tablet (40  mg total) by mouth 2 (two) times daily. 11/13/23   Sebastian Toribio GAILS, MD  ramelteon  (ROZEREM ) 8 MG tablet Take 1 tablet (8 mg total) by mouth at bedtime. 10/27/23   Katrinka Garnette KIDD, MD  sertraline  (ZOLOFT ) 50 MG tablet Take 25 mg by mouth at bedtime.    [provider]  sevelamer  carbonate (RENVELA ) 800 MG tablet Take 1,600 mg by mouth 3 (three) times daily with meals.    [provider]  Sodium Chloride  Flush (SALINE FLUSH) 0.9 % SOLN Use 5 mLs by Intracatheter route daily as directed. 11/25/23   Caperilla, Marissa N, PA  timolol  (TIMOPTIC ) 0.5 % ophthalmic solution Place 1 drop into both eyes in the morning and at bedtime.    [provider]  traMADol  (ULTRAM ) 50 MG tablet Take 1 tablet (50 mg total) by mouth every 8 (eight) hours as needed (for pain). 12/03/23   Danford, Lonni SQUIBB, MD  TYLENOL  325 MG tablet Take 325-650 mg by mouth every 6 (six) hours as needed for mild pain (pain score 1-3) or headache.    [provider]     Critical care time: 45 minutes     JD Emilio RIGGERS [facility] 01/26/2024, 7:03 PM  Please see [email or web address] for pager details.  From 7A-7P if no response, please call [phone]. After hours, please call ELink [phone].

## 2024-01-26 NOTE — H&P (Signed)
 History and Physical    Patient: Joshua Riley FMW:[id number] DOB: [birth year] DOA: 01/25/2024 DOS: the patient was seen and examined on 01/26/2024 PCP: Katrinka Garnette KIDD, MD  Patient coming from: Home  Chief Complaint:  Chief Complaint  Patient presents with   Abdominal Pain   HPI: Joshua Riley is a 88 y.o. male with medical history significant of ESRD, a-fib, anemia, presacral fluid collection, and ulcerative colitis (with previous TAC and ileostomy), CVA, chronic sacral osteomyelitis recently completed course of IV ertapenem  and vancomycin , history of recurrent syncope, p/w abd pain/n/v and found to have recurrent SBO.   Pt reports being in his USOH until yesterday at 1300 when he began have acute belly pain while taking his multivitamins. He immediately became concerned that he may have had another SBO, but waited a while. When his pain worsened, and he had intractable n/v, he presented to the ED.   In the ED, pt tachycardic and hypotensive blood pressure upper 70s range.  Patient is afebrile. CBC showing leukocytosis 11, stable H&H and normal platelet count. CMP unremarkable except evidence of chronic ESRD and elevated AST 144, elevated anion gap 18. Elevated pro time INR. Elevated lactic acid level 2.6.  Pending repeat lactic acid level. Respiratory panel pending. Blood culture is pending. CXR w/ no acute disease process. CT abd/pelvis showed small parastomal hernia contains fat and a knuckle of small bowel, and interim development of moderate to marked fluid distension of the stomach with multiple dilated fluid-filled loops of proximal to mid small bowel and fecalized bowel in the pelvis consistent with small bowel obstruction. EDP adminstered IV abx (aztreonam  and vancomycin ) and IVF, consulted EGS who recommended NGT, and requested admission.     Review of Systems: As mentioned in the history of present illness. All other systems reviewed and are negative. Past Medical History:   Diagnosis Date   A-fib (HCC)    Anemia    Arthritis    Cancer (HCC)    Basal cell   COVID-19    2021   Dysrhythmia    Afib-controlled on eliquis    ESRD (end stage renal disease) (HCC) 10/22/2021   Glaucoma 11/18/2021   History of DVT (deep vein thrombosis)    Hydronephrosis    managed wtih a PCN   Idiopathic neuropathy 10/22/2021   lyrica     Ileostomy in place [facility])    Obstructive uropathy    With chronic left nephrostomy   Old retinal detachment, total or subtotal    Orthostatic hypotension 10/22/2021   Sleep apnea    does not need a machine   Stroke (HCC)    Ulcerative colitis (HCC)    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   Past Surgical History:  Procedure Laterality Date   BASAL CELL CARCINOMA EXCISION     10/23   COLON SURGERY     creation of j pouch     and subsequent takedown of j pouch   EYE SURGERY     IR CATHETER TUBE CHANGE  12/18/2023   IR NEPHROSTOMY EXCHANGE LEFT  12/10/2021   IR NEPHROSTOMY EXCHANGE LEFT  04/22/2022   IR NEPHROSTOMY EXCHANGE LEFT  07/29/2022   IR NEPHROSTOMY EXCHANGE LEFT  10/28/2022   IR NEPHROSTOMY EXCHANGE LEFT  02/05/2023   IR NEPHROSTOMY EXCHANGE LEFT  05/07/2023   IR NEPHROSTOMY EXCHANGE LEFT  06/25/2023   IR NEPHROSTOMY EXCHANGE LEFT  07/17/2023   IR NEPHROSTOMY EXCHANGE LEFT  10/22/2023   IR RADIOLOGIST EVAL & MGMT  11/25/2023   IR RADIOLOGIST EVAL & MGMT  12/18/2023   LEFT HEART CATH AND CORONARY ANGIOGRAPHY N/A 07/22/2022   Procedure: LEFT HEART CATH AND CORONARY ANGIOGRAPHY;  Surgeon: [place], Peter M, MD;  Location: [facility] INVASIVE CV LAB;  Service: Cardiovascular;  Laterality: N/A;   REVISION OF ARTERIOVENOUS GORETEX GRAFT Left 05/06/2022   Procedure: REDO LEFT THIGH ARTERIOVENOUS 4-7 MM GORETEX GRAFT;  Surgeon: Eliza Lonni RAMAN, MD;  Location: [facility] OR;  Service: Vascular;  Laterality: Left;   SMALL INTESTINE SURGERY     TOTAL COLECTOMY     Social History:  reports that he quit smoking about 40 years ago.  His smoking use included cigarettes. He started smoking about 46 years ago. He has a 12 pack-year smoking history. He has never been exposed to tobacco smoke. He has never used smokeless tobacco. He reports current alcohol use of about 5.0 standard drinks of alcohol per week. He reports that he does not use drugs.  Allergies  Allergen Reactions   Baclofen Other (See Comments)    Altered mental status, after accidental overdose     Cephalosporins Rash   Ciprofloxacin Itching and Rash    Family History  Problem Relation Age of Onset   Stroke Mother    Cancer Father    Esophageal cancer Brother     Prior to Admission medications   Medication Sig Start Date End Date Taking? Authorizing Provider  albuterol  (PROVENTIL ) (2.5 MG/3ML) 0.083% nebulizer solution Take 2.5 mg by nebulization every 6 (six) hours as needed for wheezing or shortness of breath.    [provider]  albuterol  (VENTOLIN  HFA) 108 (90 Base) MCG/ACT inhaler Inhale 1-2 puffs into the lungs every 6 (six) hours as needed for wheezing or shortness of breath.    [provider]  atorvastatin  (LIPITOR) 20 MG tablet Take 1 tablet (20 mg total) by mouth daily. 11/14/23   Sebastian Toribio GAILS, MD  budesonide  (PULMICORT ) 0.5 MG/2ML nebulizer solution Take 0.5 mg by nebulization 2 (two) times daily as needed (for respiratory flares).    [provider]  calcitRIOL  (ROCALTROL ) 0.5 MCG capsule Take 1 capsule (0.5 mcg total) by mouth every Monday, Wednesday, and Friday with hemodialysis. 12/04/23   Danford, Lonni SQUIBB, MD  Cyanocobalamin (VITAMIN B-12 PO) Take 1 capsule by mouth in the morning and at bedtime.    [provider]  dorzolamide  (TRUSOPT ) 2 % ophthalmic solution Place 1 drop into the right eye at bedtime. 06/17/23   [provider]  ELIQUIS  2.5 MG TABS tablet Take 1 tablet (2.5 mg total) by mouth 2 (two) times daily. 08/07/22   Hobart Powell BRAVO, MD  fludrocortisone  (FLORINEF ) 0.1 MG  tablet Take 1 tablet (0.1 mg total) by mouth daily. 12/04/23   Danford, Lonni SQUIBB, MD  folic acid  (FOLVITE ) 1 MG tablet Take 1 tablet (1 mg total) by mouth daily. 07/21/23   Regalado, Belkys A, MD  gabapentin  (NEURONTIN ) 300 MG capsule Take 300 mg by mouth at bedtime. 09/09/23   [provider]  loperamide  (IMODIUM ) 2 MG capsule Take 1 capsule (2 mg total) by mouth as needed for diarrhea or loose stools. 11/13/23   Sebastian Toribio GAILS, MD  midodrine  (PROAMATINE ) 10 MG tablet Take 2 tablets (20 mg total) by mouth 3 (three) times daily with meals. 12/22/23   Katrinka Garnette KIDD, MD  mometasone -formoterol  (DULERA ) 200-5 MCG/ACT AERO Inhale 2 puffs into the lungs 2 (two) times daily. 09/08/23   Rosario Leatrice FERNS, MD  multivitamin (RENA-VIT) TABS  tablet Take 1 tablet by mouth daily.    [provider]  Omega Fatty Acids-Vitamins (OMEGA-3 GUMMIES) CHEW Chew 1 tablet by mouth in the morning and at bedtime.    [provider]  pantoprazole  (PROTONIX ) 40 MG tablet Take 1 tablet (40 mg total) by mouth 2 (two) times daily. 11/13/23   Sebastian Toribio GAILS, MD  ramelteon  (ROZEREM ) 8 MG tablet Take 1 tablet (8 mg total) by mouth at bedtime. 10/27/23   Katrinka Garnette KIDD, MD  sertraline  (ZOLOFT ) 50 MG tablet Take 25 mg by mouth at bedtime.    [provider]  sevelamer  carbonate (RENVELA ) 800 MG tablet Take 1,600 mg by mouth 3 (three) times daily with meals.    [provider]  Sodium Chloride  Flush (SALINE FLUSH) 0.9 % SOLN Use 5 mLs by Intracatheter route daily as directed. 11/25/23   Caperilla, Marissa N, PA  timolol  (TIMOPTIC ) 0.5 % ophthalmic solution Place 1 drop into both eyes in the morning and at bedtime.    [provider]  traMADol  (ULTRAM ) 50 MG tablet Take 1 tablet (50 mg total) by mouth every 8 (eight) hours as needed (for pain). 12/03/23   Danford, Lonni SQUIBB, MD  TYLENOL  325 MG tablet Take 325-650 mg by mouth every 6 (six) hours as needed for mild pain (pain  score 1-3) or headache.    [provider]    Physical Exam: Vitals:   01/26/24 0500 01/26/24 0530 01/26/24 0600 01/26/24 0703  BP: (!) 86/65 91/69 (!) 89/63 98/67  Pulse: 86   84  Resp: 12 14 15 16   Temp:    98.3 F (36.8 C)  TempSrc:    Oral  SpO2: 98%   96%  Weight:    74.2 kg  Height:    5' 8 (1.727 m)   General: Alert, oriented x3, resting comfortably in no acute distress Cardiovascular: Regular rate and rhythm w/o m/r/g Abdomen: Soft, mild distension, and diffusely TTP.    Data Reviewed:  Lab Results  Component Value Date   WBC 8.6 01/26/2024   HGB 8.8 (L) 01/26/2024   HCT 29.1 (L) 01/26/2024   MCV 119.8 (H) 01/26/2024   PLT 156 01/26/2024   Lab Results  Component Value Date   GLUCOSE 154 (H) 01/25/2024   CALCIUM  9.8 01/25/2024   NA 142 01/25/2024   K 4.1 01/25/2024   CO2 26 01/25/2024   CL 98 01/25/2024   BUN 33 (H) 01/25/2024   CREATININE 6.30 (H) 01/26/2024   Lab Results  Component Value Date   ALT 27 01/25/2024   AST 28 01/25/2024   ALKPHOS 144 (H) 01/25/2024   BILITOT 0.7 01/25/2024   Lab Results  Component Value Date   INR 1.3 (H) 01/25/2024   INR 1.3 (H) 01/07/2024   INR 1.1 11/27/2023    Radiology: DG Abd Portable 1V-Small Bowel Protocol-Position Verification Result Date: 01/26/2024 CLINICAL DATA:  Nasogastric tube placement. EXAM: PORTABLE ABDOMEN - 1 VIEW COMPARISON:  January 25, 2024. FINDINGS: Distal tip of nasogastric tube is seen in expected position of distal stomach. Left-sided nephrostomy is again noted. IMPRESSION: Nasogastric tube tip seen in expected position of distal stomach. Electronically Signed   By: Lynwood Landy Raddle M.D.   On: 01/26/2024 13:36   ECHOCARDIOGRAM COMPLETE Result Date: 01/26/2024    ECHOCARDIOGRAM REPORT   Patient Name:   CARDIN NITSCHKE Date of Exam: 01/26/2024 Medical Rec #:  [id number]       Height:  68.0 in Accession #:    [id number]      Weight:       163.6 lb Date of Birth:  Sep 23, 1935       BSA:           1.876 m Patient Age:    87 years        BP:           98/67 mmHg Patient Gender: M               HR:           86 bpm. Exam Location:  Inpatient Procedure: 2D Echo, Color Doppler and Cardiac Doppler (Both Spectral and Color            Flow Doppler were utilized during procedure). Indications:    I31.3 Pericardial effusion  History:        Patient has prior history of Echocardiogram examinations, most                 recent 11/10/2023. COPD, Arrythmias:Atrial Fibrillation; Risk                 Factors:Sleep Apnea.  Sonographer:    Damien Senior RDCS Referring Phys: [phone] SUBRINA SUNDIL IMPRESSIONS  1. Left ventricular ejection fraction, by estimation, is 60 to 65%. The left ventricle has normal function. The left ventricle has no regional wall motion abnormalities. There is mild asymmetric left ventricular hypertrophy of the septal segment. Left ventricular diastolic parameters are indeterminate.  2. Right ventricular systolic function is normal. The right ventricular size is normal. There is normal pulmonary artery systolic pressure. The estimated right ventricular systolic pressure is 33.0 mmHg.  3. Moderate pericardial effusion. The pericardial effusion is circumferential. No evidence of right ventricular collapse or right atrial collapse.  4. The mitral valve is degenerative. Trivial mitral valve regurgitation.  5. The aortic valve has an indeterminant number of cusps. Aortic valve regurgitation is trivial. Aortic valve sclerosis is present, with no evidence of aortic valve stenosis.  6. The inferior vena cava is dilated in size with <50% respiratory variability, suggesting right atrial pressure of 15 mmHg. Comparison(s): Prior images reviewed side by side. New pericardial effusion. FINDINGS  Left Ventricle: Left ventricular ejection fraction, by estimation, is 60 to 65%. The left ventricle has normal function. The left ventricle has no regional wall motion abnormalities. The left ventricular internal  cavity size was normal in size. There is  mild asymmetric left ventricular hypertrophy of the septal segment. Left ventricular diastolic function could not be evaluated due to atrial fibrillation. Left ventricular diastolic parameters are indeterminate. Right Ventricle: The right ventricular size is normal. No increase in right ventricular wall thickness. Right ventricular systolic function is normal. There is normal pulmonary artery systolic pressure. The tricuspid regurgitant velocity is 2.12 m/s, and  with an assumed right atrial pressure of 15 mmHg, the estimated right ventricular systolic pressure is 33.0 mmHg. Left Atrium: Left atrial size was normal in size. Right Atrium: Right atrial size was normal in size. Pericardium: A moderately sized pericardial effusion is present. The pericardial effusion is circumferential. The pericardial effusion appears to contain fibrous material. Mitral Valve: The mitral valve is degenerative in appearance. Trivial mitral valve regurgitation. Tricuspid Valve: The tricuspid valve is normal in structure. Tricuspid valve regurgitation is mild. Aortic Valve: The aortic valve has an indeterminant number of cusps. Aortic valve regurgitation is trivial. Aortic valve sclerosis is present, with no evidence of aortic valve stenosis. Pulmonic Valve: The  pulmonic valve was normal in structure. Pulmonic valve regurgitation is not visualized. Aorta: The aortic root is normal in size and structure and the ascending aorta was not well visualized. Venous: The inferior vena cava is dilated in size with less than 50% respiratory variability, suggesting right atrial pressure of 15 mmHg. IAS/Shunts: No atrial level shunt detected by color flow Doppler.  LEFT VENTRICLE PLAX 2D LVIDd:         3.10 cm LVIDs:         2.20 cm LV PW:         0.90 cm LV IVS:        1.10 cm LVOT diam:     1.90 cm LV SV:         42 LV SV Index:   22 LVOT Area:     2.84 cm  RIGHT VENTRICLE RV S prime:     9.64 cm/s TAPSE  (M-mode): 1.7 cm LEFT ATRIUM             Index        RIGHT ATRIUM           Index LA diam:        3.70 cm 1.97 cm/m   RA Area:     21.30 cm LA Vol (A2C):   52.1 ml 27.77 ml/m  RA Volume:   57.80 ml  30.80 ml/m LA Vol (A4C):   61.2 ml 32.62 ml/m LA Biplane Vol: 58.9 ml 31.39 ml/m  AORTIC VALVE LVOT Vmax:   75.00 cm/s LVOT Vmean:  57.100 cm/s LVOT VTI:    0.147 m  AORTA Ao Root diam: 3.70 cm TRICUSPID VALVE TR Peak grad:   18.0 mmHg TR Vmax:        212.00 cm/s  SHUNTS Systemic VTI:  0.15 m Systemic Diam: 1.90 cm Stanly Leavens MD Electronically signed by Stanly Leavens MD Signature Date/Time: 01/26/2024/12:27:36 PM    Final    DG Abdomen 1 View Result Date: 01/25/2024 CLINICAL DATA:  NG tube placement. EXAM: ABDOMEN - 1 VIEW COMPARISON:  September 07, 2023 FINDINGS: A nasogastric tube is seen with its distal tip overlying the expected region of the gastric antrum. The bowel gas pattern is normal. A left-sided percutaneous of proximally tube is in place. No radio-opaque calculi or other significant radiographic abnormality are seen. IMPRESSION: Nasogastric tube positioning, as described above. Electronically Signed   By: Suzen Dials M.D.   On: 01/25/2024 23:06   CT ABDOMEN PELVIS W CONTRAST Result Date: 01/25/2024 CLINICAL DATA:  Pain nausea reduced ostomy output EXAM: CT ABDOMEN AND PELVIS WITH CONTRAST TECHNIQUE: Multidetector CT imaging of the abdomen and pelvis was performed using the standard protocol following bolus administration of intravenous contrast. RADIATION DOSE REDUCTION: This exam was performed according to the departmental dose-optimization program which includes automated exposure control, adjustment of the mA and/or kV according to patient size and/or use of iterative reconstruction technique. CONTRAST:  OMNIPAQUE  IOHEXOL  300 MG/ML  SOLN COMPARISON:  CT 12/30/2023, 12/18/2023 FINDINGS: Lower chest: Lung bases demonstrate atelectasis or scarring at the bases.  Cardiomegaly. Moderate pericardial effusion, increased compared to prior, measures up to 19 mm maximum thickness along the right cardiac border, and exhibits mild diffuse rim enhancement. Slight increased density values within the fluid. Hepatobiliary: Gallstone. No focal hepatic abnormality or biliary dilatation Pancreas: Unremarkable. No pancreatic ductal dilatation or surrounding inflammatory changes. Spleen: Normal in size without focal abnormality. Adrenals/Urinary Tract: Adrenal glands are normal. Atrophic kidneys without hydronephrosis. Left percutaneous nephrostomy  catheter with decreased collecting system. Renal cysts for which no imaging follow-up is recommended. Thick-walled collapsed appearance of the urinary bladder with mild perivesical stranding. Stomach/Bowel: Stomach is moderately distended with fluid. Interim development of multiple dilated fluid-filled loops of proximal to mid small bowel, measuring up to 4.4 cm. Fecalized small bowel in the pelvis. Adhesed appearance of small-bowel loops to the anterior abdominal wall below the level of umbilicus. Decompressed small bowel in the region and remaining distal small bowel is decompressed. Patient is status post colectomy with right abdominal ileostomy. Small parastomal hernia contains fat and a knuckle of small bowel. Vascular/Lymphatic: Aortic atherosclerosis. No enlarged abdominal or pelvic lymph nodes. Reproductive: Slightly enlarged prostate Other: No free air. Left trans gluteal percutaneous drainage catheter since the prior exam with pigtail in the presacral region at the site of prior fluid collection. Residual soft tissue thickening in the region with small residual fluid inferior to the pigtail measuring about 14 x 16 mm on series 2, image 71. Musculoskeletal: No acute osseous abnormality. 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 Dialysis graft in the left thigh and groin with similar small fluid collection in the left groin  measuring 19 mm, previously 19 mm. Small fluid collection measuring 14 mm adjacent to the lateral/arterial limb of the graft in the thigh, stable since 2024. IMPRESSION: 1. Status post proctocolectomy with right abdominal ileostomy. Small parastomal hernia contains fat and a knuckle of small bowel. Interim development of moderate to marked fluid distension of the stomach with multiple dilated fluid-filled loops of proximal to mid small bowel and fecalized bowel in the pelvis consistent with small bowel obstruction. Transition point appears to be within the low anterior pelvis where there is adhesed appearance of multiple small bowel loops. 2. Left trans gluteal percutaneous drainage catheter with pigtail in the presacral region at the site of prior fluid collection. Residual soft tissue thickening in the region with small residual fluid collection inferior to the pigtail measuring about 14 x 16 mm. 3. Moderate pericardial effusion, increased compared to prior, exhibits mild diffuse rim enhancement. Slight increased density values within the fluid. Correlate for infection/pericarditis. 4. Gallstone. 5. Thick-walled collapsed appearance of the urinary bladder with mild perivesical stranding. 6. Left percutaneous nephrostomy catheter with no hydronephrosis. Atrophic native kidneys. Aortic Atherosclerosis (ICD10-I70.0). Electronically Signed   By: Luke Bun M.D.   On: 01/25/2024 20:40   DG Chest Port 1 View Result Date: 01/25/2024 CLINICAL DATA:  Concern for sepsis EXAM: PORTABLE CHEST 1 VIEW COMPARISON:  None Available. FINDINGS: 11/27/2023 stable enlarged cardiac silhouette. No effusion, infiltrate, pneumothorax. Mild scarring at LEFT lung base. RIGHT subclavian stent. IMPRESSION: 1. No acute cardiopulmonary process. 2. Cardiomegaly. Electronically Signed   By: Jackquline Boxer M.D.   On: 01/25/2024 19:36    Assessment and Plan: 56M h/o ESRD, a-fib, anemia, presacral fluid collection, and ulcerative colitis  (with previous TAC and ileostomy), CVA, chronic sacral osteomyelitis recently completed course of IV ertapenem  and vancomycin , history of recurrent syncope, p/w abd pain/n/v and found to have recurrent SBO.   SBO Elevated procalcitonin and lactic acid c/f impending perforation; low suspicion for alternative infection -EGS consulted; apprec eval/recs -Defer further abx -MIVF: LR at 150cc/h for now -IV tylenol  1g QID and dilaudid  1mg  q4h prn for now   ESRD -Renal consulted; apprec eval/recs -PTA Renvela  and calcitriol  0.5mcg daily -HOLD pta midiodrine given SBO   Advance Care Planning:   Code Status: Full Code   Consults: Renal and EGS  Family  Communication: N/A  Severity of Illness: The appropriate patient status for this patient is INPATIENT. Inpatient status is judged to be reasonable and necessary in order to provide the required intensity of service to ensure the patient's safety. The patient's presenting symptoms, physical exam findings, and initial radiographic and laboratory data in the context of their chronic comorbidities is felt to place them at high risk for further clinical deterioration. Furthermore, it is not anticipated that the patient will be medically stable for discharge from the hospital within 2 midnights of admission.   * I certify that at the point of admission it is my clinical judgment that the patient will require inpatient hospital care spanning beyond 2 midnights from the point of admission due to high intensity of service, high risk for further deterioration and high frequency of surveillance required.*   ------- I spent 55 minutes reviewing previous labs/notes, obtaining separate history at the bedside, counseling/discussing the treatment plan outlined above, ordering medications/tests, and performing clinical documentation.  Author: Marsha Ada, MD 01/26/2024 7:54 AM  For on call review [email or web address].

## 2024-01-26 NOTE — Progress Notes (Signed)
 Subjective: CC: Called to bedside by RN to assess patient d/t increased abdominal pain and refractory hypotension. Patient has not had his midodrine  that he typically takes daily for chronic hypotension. He is having crampy abdominal pain intermittent consistent with pain w/ peristalsis.   Objective: Vital signs in last 24 hours: Temp:  [98 F (36.7 C)-98.3 F (36.8 C)] 98.3 F (36.8 C) (07/22 0703) Pulse Rate:  [52-110] 84 (07/22 0703) Resp:  [12-27] 16 (07/22 0703) BP: (73-98)/(51-73) 98/67 (07/22 0703) SpO2:  [86 %-99 %] 96 % (07/22 0703) Weight:  [72.6 kg-74.2 kg] 74.2 kg (07/22 0703)    Intake/Output from previous day: 07/21 0701 - 07/22 0700 In: 3429.4 [I.V.:1000.5; IV Piggyback:2429] Out: -  Intake/Output this shift: No intake/output data recorded.  PE:  Constitutional:      General: He is not in acute distress.    Appearance: He is not diaphoretic.  Cardiovascular:     Rate and Rhythm: Irregular rhythm.  Pulmonary:     Effort: Pulmonary effort is normal.  Abdominal:     General: Abdomen is protuberant.     Palpations: Abdomen is soft.     Tenderness: There is generalized abdominal tenderness. There is no guarding or rebound.     Comments: Moderately distended, NGT in place with about 300 cc of bilious output. Ileostomy with no output.   Lab Results:  Recent Labs    01/25/24 1854 01/26/24 0944  WBC 11.0* 8.6  HGB 10.2* 8.8*  HCT 33.2* 29.1*  PLT 233 156   BMET Recent Labs    01/25/24 1854 01/26/24 0944  NA 142  --   K 4.1  --   CL 98  --   CO2 26  --   GLUCOSE 154*  --   BUN 33*  --   CREATININE 6.02* 6.30*  CALCIUM  9.8  --    PT/INR Recent Labs    01/25/24 1854  LABPROT 16.9*  INR 1.3*   CMP     Component Value Date/Time   NA 142 01/25/2024 1854   NA 138 04/10/2023 1541   K 4.1 01/25/2024 1854   CL 98 01/25/2024 1854   CO2 26 01/25/2024 1854   GLUCOSE 154 (H) 01/25/2024 1854   BUN 33 (H) 01/25/2024 1854   BUN 19  04/10/2023 1541   CREATININE 6.30 (H) 01/26/2024 0944   CALCIUM  9.8 01/25/2024 1854   PROT 7.9 01/25/2024 1854   ALBUMIN  4.4 01/25/2024 1854   AST 28 01/25/2024 1854   ALT 27 01/25/2024 1854   ALKPHOS 144 (H) 01/25/2024 1854   BILITOT 0.7 01/25/2024 1854   GFRNONAA 8 (L) 01/26/2024 0944   Lipase     Component Value Date/Time   LIPASE 49 09/06/2023 1808    Studies/Results: DG Abd Portable 1V-Small Bowel Protocol-Position Verification Result Date: 01/26/2024 CLINICAL DATA:  Nasogastric tube placement. EXAM: PORTABLE ABDOMEN - 1 VIEW COMPARISON:  January 25, 2024. FINDINGS: Distal tip of nasogastric tube is seen in expected position of distal stomach. Left-sided nephrostomy is again noted. IMPRESSION: Nasogastric tube tip seen in expected position of distal stomach. Electronically Signed   By: Lynwood Landy Raddle M.D.   On: 01/26/2024 13:36   ECHOCARDIOGRAM COMPLETE Result Date: 01/26/2024    ECHOCARDIOGRAM REPORT   Patient Name:   Joshua Riley Date of Exam: 01/26/2024 Medical Rec #:  [id number]       Height:       68.0 in Accession #:    [id number]  Weight:       163.6 lb Date of Birth:  [birth year]       BSA:          1.876 m Patient Age:    87 years        BP:           98/67 mmHg Patient Gender: M               HR:           86 bpm. Exam Location:  Inpatient Procedure: 2D Echo, Color Doppler and Cardiac Doppler (Both Spectral and Color            Flow Doppler were utilized during procedure). Indications:    I31.3 Pericardial effusion  History:        Patient has prior history of Echocardiogram examinations, most                 recent 11/10/2023. COPD, Arrythmias:Atrial Fibrillation; Risk                 Factors:Sleep Apnea.  Sonographer:    Damien Senior RDCS Referring Phys: [phone] SUBRINA SUNDIL IMPRESSIONS  1. Left ventricular ejection fraction, by estimation, is 60 to 65%. The left ventricle has normal function. The left ventricle has no regional wall motion abnormalities. There is mild  asymmetric left ventricular hypertrophy of the septal segment. Left ventricular diastolic parameters are indeterminate.  2. Right ventricular systolic function is normal. The right ventricular size is normal. There is normal pulmonary artery systolic pressure. The estimated right ventricular systolic pressure is 33.0 mmHg.  3. Moderate pericardial effusion. The pericardial effusion is circumferential. No evidence of right ventricular collapse or right atrial collapse.  4. The mitral valve is degenerative. Trivial mitral valve regurgitation.  5. The aortic valve has an indeterminant number of cusps. Aortic valve regurgitation is trivial. Aortic valve sclerosis is present, with no evidence of aortic valve stenosis.  6. The inferior vena cava is dilated in size with <50% respiratory variability, suggesting right atrial pressure of 15 mmHg. Comparison(s): Prior images reviewed side by side. New pericardial effusion. FINDINGS  Left Ventricle: Left ventricular ejection fraction, by estimation, is 60 to 65%. The left ventricle has normal function. The left ventricle has no regional wall motion abnormalities. The left ventricular internal cavity size was normal in size. There is  mild asymmetric left ventricular hypertrophy of the septal segment. Left ventricular diastolic function could not be evaluated due to atrial fibrillation. Left ventricular diastolic parameters are indeterminate. Right Ventricle: The right ventricular size is normal. No increase in right ventricular wall thickness. Right ventricular systolic function is normal. There is normal pulmonary artery systolic pressure. The tricuspid regurgitant velocity is 2.12 m/s, and  with an assumed right atrial pressure of 15 mmHg, the estimated right ventricular systolic pressure is 33.0 mmHg. Left Atrium: Left atrial size was normal in size. Right Atrium: Right atrial size was normal in size. Pericardium: A moderately sized pericardial effusion is present. The  pericardial effusion is circumferential. The pericardial effusion appears to contain fibrous material. Mitral Valve: The mitral valve is degenerative in appearance. Trivial mitral valve regurgitation. Tricuspid Valve: The tricuspid valve is normal in structure. Tricuspid valve regurgitation is mild. Aortic Valve: The aortic valve has an indeterminant number of cusps. Aortic valve regurgitation is trivial. Aortic valve sclerosis is present, with no evidence of aortic valve stenosis. Pulmonic Valve: The pulmonic valve was normal in structure. Pulmonic valve regurgitation is not visualized. Aorta:  The aortic root is normal in size and structure and the ascending aorta was not well visualized. Venous: The inferior vena cava is dilated in size with less than 50% respiratory variability, suggesting right atrial pressure of 15 mmHg. IAS/Shunts: No atrial level shunt detected by color flow Doppler.  LEFT VENTRICLE PLAX 2D LVIDd:         3.10 cm LVIDs:         2.20 cm LV PW:         0.90 cm LV IVS:        1.10 cm LVOT diam:     1.90 cm LV SV:         42 LV SV Index:   22 LVOT Area:     2.84 cm  RIGHT VENTRICLE RV S prime:     9.64 cm/s TAPSE (M-mode): 1.7 cm LEFT ATRIUM             Index        RIGHT ATRIUM           Index LA diam:        3.70 cm 1.97 cm/m   RA Area:     21.30 cm LA Vol (A2C):   52.1 ml 27.77 ml/m  RA Volume:   57.80 ml  30.80 ml/m LA Vol (A4C):   61.2 ml 32.62 ml/m LA Biplane Vol: 58.9 ml 31.39 ml/m  AORTIC VALVE LVOT Vmax:   75.00 cm/s LVOT Vmean:  57.100 cm/s LVOT VTI:    0.147 m  AORTA Ao Root diam: 3.70 cm TRICUSPID VALVE TR Peak grad:   18.0 mmHg TR Vmax:        212.00 cm/s  SHUNTS Systemic VTI:  0.15 m Systemic Diam: 1.90 cm Stanly Leavens MD Electronically signed by Stanly Leavens MD Signature Date/Time: 01/26/2024/12:27:36 PM    Final    DG Abdomen 1 View Result Date: 01/25/2024 CLINICAL DATA:  NG tube placement. EXAM: ABDOMEN - 1 VIEW COMPARISON:  September 07, 2023 FINDINGS: A  nasogastric tube is seen with its distal tip overlying the expected region of the gastric antrum. The bowel gas pattern is normal. A left-sided percutaneous of proximally tube is in place. No radio-opaque calculi or other significant radiographic abnormality are seen. IMPRESSION: Nasogastric tube positioning, as described above. Electronically Signed   By: Suzen Dials M.D.   On: 01/25/2024 23:06   CT ABDOMEN PELVIS W CONTRAST Result Date: 01/25/2024 CLINICAL DATA:  Pain nausea reduced ostomy output EXAM: CT ABDOMEN AND PELVIS WITH CONTRAST TECHNIQUE: Multidetector CT imaging of the abdomen and pelvis was performed using the standard protocol following bolus administration of intravenous contrast. RADIATION DOSE REDUCTION: This exam was performed according to the departmental dose-optimization program which includes automated exposure control, adjustment of the mA and/or kV according to patient size and/or use of iterative reconstruction technique. CONTRAST:  OMNIPAQUE  IOHEXOL  300 MG/ML  SOLN COMPARISON:  CT 12/30/2023, 12/18/2023 FINDINGS: Lower chest: Lung bases demonstrate atelectasis or scarring at the bases. Cardiomegaly. Moderate pericardial effusion, increased compared to prior, measures up to 19 mm maximum thickness along the right cardiac border, and exhibits mild diffuse rim enhancement. Slight increased density values within the fluid. Hepatobiliary: Gallstone. No focal hepatic abnormality or biliary dilatation Pancreas: Unremarkable. No pancreatic ductal dilatation or surrounding inflammatory changes. Spleen: Normal in size without focal abnormality. Adrenals/Urinary Tract: Adrenal glands are normal. Atrophic kidneys without hydronephrosis. Left percutaneous nephrostomy catheter with decreased collecting system. Renal cysts for which no imaging follow-up is recommended.  Thick-walled collapsed appearance of the urinary bladder with mild perivesical stranding. Stomach/Bowel: Stomach is  moderately distended with fluid. Interim development of multiple dilated fluid-filled loops of proximal to mid small bowel, measuring up to 4.4 cm. Fecalized small bowel in the pelvis. Adhesed appearance of small-bowel loops to the anterior abdominal wall below the level of umbilicus. Decompressed small bowel in the region and remaining distal small bowel is decompressed. Patient is status post colectomy with right abdominal ileostomy. Small parastomal hernia contains fat and a knuckle of small bowel. Vascular/Lymphatic: Aortic atherosclerosis. No enlarged abdominal or pelvic lymph nodes. Reproductive: Slightly enlarged prostate Other: No free air. Left trans gluteal percutaneous drainage catheter since the prior exam with pigtail in the presacral region at the site of prior fluid collection. Residual soft tissue thickening in the region with small residual fluid inferior to the pigtail measuring about 14 x 16 mm on series 2, image 71. Musculoskeletal: No acute osseous abnormality. 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 Dialysis graft in the left thigh and groin with similar small fluid collection in the left groin measuring 19 mm, previously 19 mm. Small fluid collection measuring 14 mm adjacent to the lateral/arterial limb of the graft in the thigh, stable since 2024. IMPRESSION: 1. Status post proctocolectomy with right abdominal ileostomy. Small parastomal hernia contains fat and a knuckle of small bowel. Interim development of moderate to marked fluid distension of the stomach with multiple dilated fluid-filled loops of proximal to mid small bowel and fecalized bowel in the pelvis consistent with small bowel obstruction. Transition point appears to be within the low anterior pelvis where there is adhesed appearance of multiple small bowel loops. 2. Left trans gluteal percutaneous drainage catheter with pigtail in the presacral region at the site of prior fluid collection. Residual soft tissue  thickening in the region with small residual fluid collection inferior to the pigtail measuring about 14 x 16 mm. 3. Moderate pericardial effusion, increased compared to prior, exhibits mild diffuse rim enhancement. Slight increased density values within the fluid. Correlate for infection/pericarditis. 4. Gallstone. 5. Thick-walled collapsed appearance of the urinary bladder with mild perivesical stranding. 6. Left percutaneous nephrostomy catheter with no hydronephrosis. Atrophic native kidneys. Aortic Atherosclerosis (ICD10-I70.0). Electronically Signed   By: Luke Bun M.D.   On: 01/25/2024 20:40   DG Chest Port 1 View Result Date: 01/25/2024 CLINICAL DATA:  Concern for sepsis EXAM: PORTABLE CHEST 1 VIEW COMPARISON:  None Available. FINDINGS: 11/27/2023 stable enlarged cardiac silhouette. No effusion, infiltrate, pneumothorax. Mild scarring at LEFT lung base. RIGHT subclavian stent. IMPRESSION: 1. No acute cardiopulmonary process. 2. Cardiomegaly. Electronically Signed   By: Jackquline Boxer M.D.   On: 01/25/2024 19:36    Anti-infectives: Anti-infectives (From admission, onward)    Start     Dose/Rate Route Frequency Ordered Stop   01/25/24 2000  aztreonam  (AZACTAM ) 2 g in sodium chloride  0.9 % 100 mL IVPB        2 g 200 mL/hr over 30 Minutes Intravenous  Once 01/25/24 1958 01/25/24 2101   01/25/24 2000  metroNIDAZOLE  (FLAGYL ) IVPB 500 mg        500 mg 100 mL/hr over 60 Minutes Intravenous  Once 01/25/24 1958 01/25/24 2235   01/25/24 2000  vancomycin  (VANCOCIN ) IVPB 1000 mg/200 mL premix        1,000 mg 200 mL/hr over 60 Minutes Intravenous  Once 01/25/24 1958 01/26/24 0019        Assessment/Plan SBO    -Pain management is  difficult at this time d/t hypotension. May need low pressors. Will defer to medicine for definitive management.  -Continue NGT to LIWS.  -Patient tender on exam, lactate was ordered- results pending. No peritonitis on exam to suggest the need for emergent  intervention. Will continue to monitor w/serial abd exams.  -SBO protocol was initiated. -NGT output is minimal at this time  - Patient discussed with attending as well as primary service.   FEN - NPO, NGT to LIWS VTE - SCD's ID - Vancomycin , Flagyl    I reviewed nursing notes, hospitalist notes, last 24 h vitals and pain scores, last 48 h intake and output, last 24 h labs and trends, and last 24 h imaging results.    LOS: 0 days    Eulah Hammonds, [facility] Surgery 01/26/2024, 3:53 PM Please see Amion for pager number during day hours 7:00am-4:30pm

## 2024-01-26 NOTE — Progress Notes (Signed)
 PHARMACY - ANTICOAGULATION CONSULT NOTE  Pharmacy Consult for heparin  Indication:   Allergies  Allergen Reactions   Baclofen Other (See Comments)    Altered mental status, after accidental overdose     Cephalosporins Rash   Ciprofloxacin Itching and Rash    Patient Measurements: Height: 5' 8 (172.7 cm) Weight: 74.2 kg (163 lb 9.3 oz) IBW/kg (Calculated) : 68.4 HEPARIN  DW (KG): 74.2  Vital Signs: Temp: 98.3 F (36.8 C) (07/22 0703) Temp Source: Oral (07/22 0703) BP: 98/67 (07/22 0703) Pulse Rate: 84 (07/22 0703)  Labs: Recent Labs    01/25/24 1854 01/26/24 0944  HGB 10.2* 8.8*  HCT 33.2* 29.1*  PLT 233 156  LABPROT 16.9*  --   INR 1.3*  --   CREATININE 6.02* 6.30*    Estimated Creatinine Clearance: 8.3 mL/min (A) (by C-G formula based on SCr of 6.3 mg/dL (H)).   Medical History: Past Medical History:  Diagnosis Date   A-fib (HCC)    Anemia    Arthritis    Cancer (HCC)    Basal cell   COVID-19    2021   Dysrhythmia    Afib-controlled on eliquis    ESRD (end stage renal disease) (HCC) 10/22/2021   Glaucoma 11/18/2021   History of DVT (deep vein thrombosis)    Hydronephrosis    managed wtih a PCN   Idiopathic neuropathy 10/22/2021   lyrica     Ileostomy in place [facility])    Obstructive uropathy    With chronic left nephrostomy   Old retinal detachment, total or subtotal    Orthostatic hypotension 10/22/2021   Sleep apnea    does not need a machine   Stroke (HCC)    Ulcerative colitis (HCC)    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      albuterol  (VENTOLIN  HFA) 108 (90 Base) MCG/ACT inhaler Inhale 1-2 puffs into the lungs every 6 (six) hours as needed for wheezing or shortness  of breath.      atorvastatin  (LIPITOR) 20 MG tablet Take 1 tablet (20 mg total) by mouth daily. 90 tablet 0    budesonide  (PULMICORT ) 0.5 MG/2ML nebulizer solution Take 0.5 mg by nebulization 2 (two) times daily as needed (for respiratory flares).      calcitRIOL  (ROCALTROL ) 0.5 MCG capsule Take 1 capsule (0.5 mcg total) by mouth every Monday, Wednesday, and Friday with hemodialysis.      Cyanocobalamin (VITAMIN B-12 PO) Take 1 capsule by mouth in the morning and at bedtime.      dorzolamide  (TRUSOPT ) 2 % ophthalmic solution Place 1 drop into the right eye at bedtime.      ELIQUIS  2.5 MG TABS tablet Take 1 tablet (2.5 mg total) by mouth 2 (two) times daily. 60 tablet 11    fludrocortisone  (FLORINEF ) 0.1 MG tablet Take 1 tablet (0.1 mg total) by mouth daily.      folic acid  (FOLVITE ) 1 MG tablet Take 1 tablet (1 mg total) by mouth daily. 30 tablet 0    gabapentin  (NEURONTIN ) 300 MG capsule Take 300 mg by mouth at bedtime.      loperamide  (IMODIUM ) 2 MG capsule Take 1 capsule (2 mg total) by mouth as needed for diarrhea or loose stools. 20 capsule 0  midodrine  (PROAMATINE ) 10 MG tablet Take 2 tablets (20 mg total) by mouth 3 (three) times daily with meals.      mometasone -formoterol  (DULERA ) 200-5 MCG/ACT AERO Inhale 2 puffs into the lungs 2 (two) times daily. 1 each 1    multivitamin (RENA-VIT) TABS tablet Take 1 tablet by mouth daily.      Omega Fatty Acids-Vitamins (OMEGA-3 GUMMIES) CHEW Chew 1 tablet by mouth in the morning and at bedtime.      pantoprazole  (PROTONIX ) 40 MG tablet Take 1 tablet (40 mg total) by mouth 2 (two) times daily. 60 tablet 1    ramelteon  (ROZEREM ) 8 MG tablet Take 1 tablet (8 mg total) by mouth at bedtime. 30 tablet 5    sertraline  (ZOLOFT ) 50 MG tablet Take 25 mg by mouth at bedtime.      sevelamer  carbonate (RENVELA ) 800 MG tablet Take 1,600 mg by mouth 3 (three) times daily with meals.      Sodium Chloride  Flush (SALINE FLUSH) 0.9 % SOLN Use 5 mLs by Intracatheter  route daily as directed. 150 mL 1    timolol  (TIMOPTIC ) 0.5 % ophthalmic solution Place 1 drop into both eyes in the morning and at bedtime.      traMADol  (ULTRAM ) 50 MG tablet Take 1 tablet (50 mg total) by mouth every 8 (eight) hours as needed (for pain). 12 tablet 0    TYLENOL  325 MG tablet Take 325-650 mg by mouth every 6 (six) hours as needed for mild pain (pain score 1-3) or headache.      Scheduled:   [START ON 01/27/2024] calcitRIOL   0.5 mcg Per Tube Q M,W,F-HD   dorzolamide   1 drop Right Eye QHS   fluticasone  furoate-vilanterol  1 puff Inhalation Daily   multivitamin  1 tablet Per Tube Daily   sertraline   25 mg Per Tube QHS   sevelamer  carbonate  1,600 mg Per Tube TID WC   timolol   1 drop Both Eyes BID    Assessment: 88 yo male with SBO. He has a history of afib on apixaban  at home (timing of last dose unknown but none was given today) -Hg= 8.8  Goal of Therapy:  Heparin  level 0.3-0.7 units/ml aPTT 66-102 seconds Monitor platelets by anticoagulation protocol: Yes   Plan:  -No heparin  bolus as he was on apixaban  at home -Begin heparin  infusion at 1050 units/hr -aPTT and heparin  level in 8 hrs  Prentice Poisson, PharmD Clinical Pharmacist **Pharmacist phone directory can now be found on [email or web address] ([email or web address]).  Listed under [facility].

## 2024-01-26 NOTE — Progress Notes (Signed)
 eLink Physician-Brief Progress Note Patient Name: Joshua Riley DOB: 16-Feb-1936 MRN: [id number]   Date of Service  01/26/2024  HPI/Events of Note  Patient with ESRD-DD admitted with small bowel obstruction and transferred to the ICU due to hypotension.  eICU Interventions  New Patient Evaluation.        Joshua Riley Joshua Riley 01/26/2024, 11:21 PM

## 2024-01-26 NOTE — Progress Notes (Signed)
 Patient arrived to unit this morning. A&O 4 and vitals stable. Call bell in place.

## 2024-01-26 NOTE — Telephone Encounter (Signed)
 [facility] faxed  document Home Health Certificate (Order [place] [id number]), to be filled out by provider. Patient requested to send it back via Fax within ASAP. Document is located in providers tray at front office.Please advise at [phone].

## 2024-01-26 NOTE — Progress Notes (Signed)
 PHARMACY ANTIBIOTIC CONSULT NOTE   Joshua Riley a 88 y.o. male admitted with AoC HoTN and SBO .  Pharmacy has been consulted for vancomycin  and cefepime  dosing. Due to documented cephalosporin allergy without documented administration of a cephalosporin will dose with vancomycin  and aztreonam .   Of note, antibiotics are being started empirically for sepsis on the basis of hypotension. Though it is noted patient is chronically hypotensive on midodrine  PTA- now with SBO, this is unable to be used reliably. Patient is without fever or leukocytosis- low threshold to stop antibiotics In this context. Additionally, it should be noted that as the patient is on hemodialysis, he presently has therapeutic levels of drug in his system. A supplemental vancomycin  dose will be provided to comprise a full load.   Patient received 1g vancomycin  dose 7/21 2142 (post-HD) as well as Aztreonam  2g 7/21 2025.   7/22: Scr 6.30, WBC 8.6 - Pt is ESRD on HD MWF. Next HD planned for 7/23 per nephrology note.  Vital Signs: afebrile, HR WNL-tachy, BP soft  Estimated Creatinine Clearance: 8.3 mL/min (A) (by C-G formula based on SCr of 6.3 mg/dL (H)).  Plan: START Aztreonam  2 g IV Q24H  Vancomycin  500 mg IV x1, then vancomycin  variable dosing per unstable renal function  F/U stop abx   Allergies:  Allergies  Allergen Reactions   Baclofen Other (See Comments)    Altered mental status, after accidental overdose     Cephalosporins Rash   Ciprofloxacin Itching and Rash    Filed Weights   01/25/24 1849 01/26/24 0703  Weight: 72.6 kg (160 lb) 74.2 kg (163 lb 9.3 oz)       Latest Ref Rng & Units 01/26/2024    9:44 AM 01/25/2024    6:54 PM 01/07/2024    8:41 AM  CBC  WBC 4.0 - 10.5 K/uL 8.6  11.0  11.2   Hemoglobin 13.0 - 17.0 g/dL 8.8  89.7  9.2   Hematocrit 39.0 - 52.0 % 29.1  33.2  29.1   Platelets 150 - 400 K/uL 156  233  235     Antibiotics Given (last 72 hours)     Date/Time Action Medication Dose Rate    01/25/24 2025 New Bag/Given   aztreonam  (AZACTAM ) 2 g in sodium chloride  0.9 % 100 mL IVPB 2 g 200 mL/hr   01/25/24 2105 New Bag/Given   metroNIDAZOLE  (FLAGYL ) IVPB 500 mg 500 mg 100 mL/hr   01/25/24 2142 New Bag/Given   vancomycin  (VANCOCIN ) IVPB 1000 mg/200 mL premix 1,000 mg 200 mL/hr       Antimicrobials this admission: Vancomycin  7/22>> Aztreonam  7/22>>  Thank you for allowing pharmacy to be a part of this patient's care.  Massie Fila, PharmD Clinical Pharmacist  01/26/2024 7:15 PM

## 2024-01-26 NOTE — Progress Notes (Signed)
 RN notified Dr. Georgina about the patient BP being low after being checked multiple times on both arms. He added new orders SEE MAR.General surgery was also paged due to increasing abd pain not able to get pain meds due to low bp. Pt received bolus and BP came up to 89/54. MD notified and is following the patient.

## 2024-01-26 NOTE — Consult Note (Signed)
 [name] [birth year]  [id number].    Requesting MD: Mliss Boyers, MD Chief Complaint/Reason for Consult: small bowel obstruction  HPI: Joshua Riley is an 88 yo male with a history of ESRD, a-fib, anemia, presacral fluid collection, and ulcerative colitis (with previous TAC and ileostomy), who presents to the ED with abdominal pain, distension, and decreased ileostomy output. A CT scan showed a small bowel obstruction with a transition in the pelvis. 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 He underwent dialysis today and has low blood pressure, which is his baseline.    He has a history of multiple prior SBOs, managed nonoperatively, as well as a recurrent presacral fluid collection.   ROS: ROS As above, see hpi  Family History  Problem Relation Age of Onset   Stroke Mother    Cancer Father    Esophageal cancer Brother     Past Medical History:  Diagnosis Date   A-fib (HCC)    Anemia    Arthritis    Cancer (HCC)    Basal cell   COVID-19    2021   Dysrhythmia    Afib-controlled on eliquis    ESRD (end stage renal disease) (HCC) 10/22/2021   Glaucoma 11/18/2021   History of DVT (deep vein thrombosis)    Hydronephrosis    managed wtih a PCN   Idiopathic neuropathy 10/22/2021   lyrica     Ileostomy in place [facility])    Obstructive uropathy    With chronic left nephrostomy   Old retinal detachment, total or subtotal    Orthostatic hypotension 10/22/2021   Sleep apnea    does not need a machine   Stroke (HCC)    Ulcerative colitis (HCC)    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    Past Surgical History:  Procedure Laterality Date   BASAL CELL CARCINOMA EXCISION     10/23   COLON SURGERY     creation of j pouch     and subsequent takedown of j pouch   EYE SURGERY     IR CATHETER TUBE CHANGE   12/18/2023   IR NEPHROSTOMY EXCHANGE LEFT  12/10/2021   IR NEPHROSTOMY EXCHANGE LEFT  04/22/2022   IR NEPHROSTOMY EXCHANGE LEFT  07/29/2022   IR NEPHROSTOMY EXCHANGE LEFT  10/28/2022   IR NEPHROSTOMY EXCHANGE LEFT  02/05/2023   IR NEPHROSTOMY EXCHANGE LEFT  05/07/2023   IR NEPHROSTOMY EXCHANGE LEFT  06/25/2023   IR NEPHROSTOMY EXCHANGE LEFT  07/17/2023   IR NEPHROSTOMY EXCHANGE LEFT  10/22/2023   IR RADIOLOGIST EVAL & MGMT  11/25/2023   IR RADIOLOGIST EVAL & MGMT  12/18/2023   LEFT HEART CATH AND CORONARY ANGIOGRAPHY N/A 07/22/2022   Procedure: LEFT HEART CATH AND CORONARY ANGIOGRAPHY;  Surgeon: Swaziland, Peter M, MD;  Location: [facility] INVASIVE CV LAB;  Service: Cardiovascular;  Laterality: N/A;   REVISION OF ARTERIOVENOUS GORETEX GRAFT Left 05/06/2022   Procedure: REDO LEFT THIGH ARTERIOVENOUS 4-7 MM GORETEX GRAFT;  Surgeon: Eliza Lonni RAMAN, MD;  Location: [facility] OR;  Service: Vascular;  Laterality: Left;   SMALL INTESTINE SURGERY     TOTAL COLECTOMY      Social History:  reports that he quit smoking about 40 years ago. His smoking use included cigarettes. He started smoking about 46 years ago. He has a 12 pack-year smoking history.  He has never been exposed to tobacco smoke. He has never used smokeless tobacco. He reports current alcohol use of about 5.0 standard drinks of alcohol per week. He reports that he does not use drugs.  Allergies:  Allergies  Allergen Reactions   Baclofen Other (See Comments)    Altered mental status, after accidental overdose     Cephalosporins Rash   Ciprofloxacin Itching and Rash    Medications Prior to Admission  Medication Sig Dispense Refill   albuterol  (PROVENTIL ) (2.5 MG/3ML) 0.083% nebulizer solution Take 2.5 mg by nebulization every 6 (six) hours as needed for wheezing or shortness of breath.     albuterol  (VENTOLIN  HFA) 108 (90 Base) MCG/ACT inhaler Inhale 1-2 puffs into the lungs every 6 (six) hours as needed for wheezing or shortness of breath.      atorvastatin  (LIPITOR) 20 MG tablet Take 1 tablet (20 mg total) by mouth daily. 90 tablet 0   budesonide  (PULMICORT ) 0.5 MG/2ML nebulizer solution Take 0.5 mg by nebulization 2 (two) times daily as needed (for respiratory flares).     calcitRIOL  (ROCALTROL ) 0.5 MCG capsule Take 1 capsule (0.5 mcg total) by mouth every Monday, Wednesday, and Friday with hemodialysis.     Cyanocobalamin (VITAMIN B-12 PO) Take 1 capsule by mouth in the morning and at bedtime.     dorzolamide  (TRUSOPT ) 2 % ophthalmic solution Place 1 drop into the right eye at bedtime.     ELIQUIS  2.5 MG TABS tablet Take 1 tablet (2.5 mg total) by mouth 2 (two) times daily. 60 tablet 11   fludrocortisone  (FLORINEF ) 0.1 MG tablet Take 1 tablet (0.1 mg total) by mouth daily.     folic acid  (FOLVITE ) 1 MG tablet Take 1 tablet (1 mg total) by mouth daily. 30 tablet 0   gabapentin  (NEURONTIN ) 300 MG capsule Take 300 mg by mouth at bedtime.     loperamide  (IMODIUM ) 2 MG capsule Take 1 capsule (2 mg total) by mouth as needed for diarrhea or loose stools. 20 capsule 0   midodrine  (PROAMATINE ) 10 MG tablet Take 2 tablets (20 mg total) by mouth 3 (three) times daily with meals.     mometasone -formoterol  (DULERA ) 200-5 MCG/ACT AERO Inhale 2 puffs into the lungs 2 (two) times daily. 1 each 1   multivitamin (RENA-VIT) TABS tablet Take 1 tablet by mouth daily.     Omega Fatty Acids-Vitamins (OMEGA-3 GUMMIES) CHEW Chew 1 tablet by mouth in the morning and at bedtime.     pantoprazole  (PROTONIX ) 40 MG tablet Take 1 tablet (40 mg total) by mouth 2 (two) times daily. 60 tablet 1   ramelteon  (ROZEREM ) 8 MG tablet Take 1 tablet (8 mg total) by mouth at bedtime. 30 tablet 5   sertraline  (ZOLOFT ) 50 MG tablet Take 25 mg by mouth at bedtime.     sevelamer  carbonate (RENVELA ) 800 MG tablet Take 1,600 mg by mouth 3 (three) times daily with meals.     Sodium Chloride  Flush (SALINE FLUSH) 0.9 % SOLN Use 5 mLs by Intracatheter route daily as directed. 150 mL 1    timolol  (TIMOPTIC ) 0.5 % ophthalmic solution Place 1 drop into both eyes in the morning and at bedtime.     traMADol  (ULTRAM ) 50 MG tablet Take 1 tablet (50 mg total) by mouth every 8 (eight) hours as needed (for pain). 12 tablet 0   TYLENOL  325 MG tablet Take 325-650 mg by mouth every 6 (six) hours as needed for mild pain (pain score 1-3) or headache.  Physical Exam: Blood pressure 98/67, pulse 84, temperature 98.3 F (36.8 C), temperature source Oral, resp. rate 16, height 5' 8 (1.727 m), weight 74.2 kg, SpO2 96%. General: pleasant, WD/WN male who is laying in bed in NAD Abd: Soft, mild distension, upper abdominal ttp without rigidity or guarding. Parastomal hernia is soft. Ostomy bag with liquid stool in bag that he reports is from before his symptoms started, +BS. No masses, hernias, or organomegaly Skin: warm and dry  Psych: A&Ox4 with an appropriate affect   Results for orders placed or performed during the hospital encounter of 01/25/24 (from the past 48 hours)  Comprehensive metabolic panel     Status: Abnormal   Collection Time: 01/25/24  6:54 PM  Result Value Ref Range   Sodium 142 135 - 145 mmol/L   Potassium 4.1 3.5 - 5.1 mmol/L   Chloride 98 98 - 111 mmol/L   CO2 26 22 - 32 mmol/L   Glucose, Bld 154 (H) 70 - 99 mg/dL    Comment: Glucose reference range applies only to samples taken after fasting for at least 8 hours.   BUN 33 (H) 8 - 23 mg/dL   Creatinine, Ser 3.97 (H) 0.61 - 1.24 mg/dL   Calcium  9.8 8.9 - 10.3 mg/dL   Total Protein 7.9 6.5 - 8.1 g/dL   Albumin  4.4 3.5 - 5.0 g/dL   AST 28 15 - 41 U/L   ALT 27 0 - 44 U/L   Alkaline Phosphatase 144 (H) 38 - 126 U/L   Total Bilirubin 0.7 0.0 - 1.2 mg/dL   GFR, Estimated 9 (L) >60 mL/min    Comment: (NOTE) Calculated using the CKD-EPI Creatinine Equation (2021)    Anion gap 18 (H) 5 - 15    Comment: Performed at [facility], [address], [place], [place] [address]  CBC with Differential      Status: Abnormal   Collection Time: 01/25/24  6:54 PM  Result Value Ref Range   WBC 11.0 (H) 4.0 - 10.5 K/uL   RBC 2.79 (L) 4.22 - 5.81 MIL/uL   Hemoglobin 10.2 (L) 13.0 - 17.0 g/dL   HCT 66.7 (L) 60.9 - 47.9 %   MCV 119.0 (H) 80.0 - 100.0 fL   MCH 36.6 (H) 26.0 - 34.0 pg   MCHC 30.7 30.0 - 36.0 g/dL   RDW 81.1 (H) 88.4 - 84.4 %   Platelets 233 150 - 400 K/uL   nRBC 0.0 0.0 - 0.2 %   Neutrophils Relative % 88 %   Neutro Abs 9.6 (H) 1.7 - 7.7 K/uL   Lymphocytes Relative 4 %   Lymphs Abs 0.5 (L) 0.7 - 4.0 K/uL   Monocytes Relative 6 %   Monocytes Absolute 0.6 0.1 - 1.0 K/uL   Eosinophils Relative 1 %   Eosinophils Absolute 0.1 0.0 - 0.5 K/uL   Basophils Relative 0 %   Basophils Absolute 0.0 0.0 - 0.1 K/uL   RBC Morphology MORPHOLOGY UNREMARKABLE    Immature Granulocytes 1 %   Abs Immature Granulocytes 0.06 0.00 - 0.07 K/uL    Comment: Performed at [facility], [address] [place], [place] [address]  Protime-INR     Status: Abnormal   Collection Time: 01/25/24  6:54 PM  Result Value Ref Range   Prothrombin Time 16.9 (H) 11.4 - 15.2 seconds   INR 1.3 (H) 0.8 - 1.2    Comment: (NOTE) INR goal varies based on device and disease states. Performed at [facility], [address] [place], [place] [address]   Lactic acid, plasma     Status: Abnormal   Collection Time: 01/25/24  7:07 PM  Result Value Ref Range   Lactic Acid, Venous 2.6 (HH) 0.5 - 1.9 mmol/L    Comment: Critical Value, Read Back and verified with M. Allen 19:53 01/25/24 by JINNY. Falicki Performed at [facility], [address] [place], [place] [address]   Culture, blood (Routine x 2)     Status: None (Preliminary result)   Collection Time: 01/25/24  7:07 PM   Specimen: Right Antecubital; Blood  Result Value Ref Range   Specimen Description      RIGHT ANTECUBITAL Performed at [facility], [address], [place], [place]  [address]    Special Requests      BOTTLES DRAWN AEROBIC AND ANAEROBIC Blood Culture results may not be optimal due to an inadequate volume of blood received in culture bottles Performed at [facility], [address], [place], [place] [address]    Culture      NO GROWTH < 12 HOURS Performed at [facility], [address] [place], [place] [address]    Report Status PENDING   Culture, blood (Routine x 2)     Status: None (Preliminary result)   Collection Time: 01/25/24  7:09 PM   Specimen: Left Antecubital; Blood  Result Value Ref Range   Specimen Description      LEFT ANTECUBITAL Performed at [facility], [address], [place], [place] [address]    Special Requests      BOTTLES DRAWN AEROBIC AND ANAEROBIC Blood Culture adequate volume Performed at [facility], [address], [place], [place] [address]    Culture      NO GROWTH < 12 HOURS Performed at [facility], [address] [place], [place] [address]    Report Status PENDING   Lactic acid, plasma     Status: None   Collection Time: 01/25/24  9:01 PM  Result Value Ref Range   Lactic Acid, Venous 1.6 0.5 - 1.9 mmol/L    Comment: Performed at [facility], [address], [place], [place] [address]  Resp panel by RT-PCR (RSV, Flu A&B, Covid) Anterior Nasal Swab     Status: None   Collection Time: 01/25/24  9:01 PM   Specimen: Anterior Nasal Swab  Result Value Ref Range   SARS Coronavirus 2 by RT PCR NEGATIVE NEGATIVE    Comment: (NOTE) SARS-CoV-2 target nucleic acids are NOT DETECTED.  The SARS-CoV-2 RNA is generally detectable in upper respiratory specimens during the acute phase of infection. The lowest concentration of SARS-CoV-2 viral copies this assay can detect is 138 copies/mL. A negative result does not preclude SARS-Cov-2 infection and should not be used as the sole basis for treatment or other patient management  decisions. A negative result may occur with  improper specimen collection/handling, submission of specimen other than nasopharyngeal swab, presence of viral mutation(s) within the areas targeted by this assay, and inadequate number of viral copies(<138 copies/mL). A negative result must be combined with clinical observations, patient history, and epidemiological information. The expected result is Negative.  Fact Sheet for Patients:  BloggerCourse.com  Fact Sheet for Healthcare Providers:  SeriousBroker.it  This test is no t yet approved or cleared by the [place]  FDA and  has been authorized for detection and/or diagnosis of SARS-CoV-2 by FDA under an Emergency Use Authorization (EUA). This EUA  will remain  in effect (meaning this test can be used) for the duration of the COVID-19 declaration under Section 564(b)(1) of the Act, 21 U.S.C.section 360bbb-3(b)(1), unless the authorization is terminated  or revoked sooner.       Influenza A by PCR NEGATIVE NEGATIVE   Influenza B by PCR NEGATIVE NEGATIVE    Comment: (NOTE) The Xpert Xpress SARS-CoV-2/FLU/RSV plus assay is intended as an aid in the diagnosis of influenza from Nasopharyngeal swab specimens and should not be used as a sole basis for treatment. Nasal washings and aspirates are unacceptable for Xpert Xpress SARS-CoV-2/FLU/RSV testing.  Fact Sheet for Patients: BloggerCourse.com  Fact Sheet for Healthcare Providers: SeriousBroker.it  This test is not yet approved or cleared by the [place]  FDA and has been authorized for detection and/or diagnosis of SARS-CoV-2 by FDA under an Emergency Use Authorization (EUA). This EUA will remain in effect (meaning this test can be used) for the duration of the COVID-19 declaration under Section 564(b)(1) of the Act, 21 U.S.C. section 360bbb-3(b)(1), unless the authorization  is terminated or revoked.     Resp Syncytial Virus by PCR NEGATIVE NEGATIVE    Comment: (NOTE) Fact Sheet for Patients: BloggerCourse.com  Fact Sheet for Healthcare Providers: SeriousBroker.it  This test is not yet approved or cleared by the [place]  FDA and has been authorized for detection and/or diagnosis of SARS-CoV-2 by FDA under an Emergency Use Authorization (EUA). This EUA will remain in effect (meaning this test can be used) for the duration of the COVID-19 declaration under Section 564(b)(1) of the Act, 21 U.S.C. section 360bbb-3(b)(1), unless the authorization is terminated or revoked.  Performed at [facility], [address] [place], [place] [address]   Sedimentation rate     Status: Abnormal   Collection Time: 01/25/24 10:10 PM  Result Value Ref Range   Sed Rate 80 (H) 0 - 16 mm/hr    Comment: Performed at [facility], [address], [place], [place] [address]  Troponin T, High Sensitivity     Status: Abnormal   Collection Time: 01/26/24 12:16 AM  Result Value Ref Range   Troponin T High Sensitivity 255 (HH) <19 ng/L    Comment: Critical Value, Read Back and verified with ARYANA THOMPSON,RN 0055 AD (NOTE) Biotin concentrations > 1000 ng/mL falsely decrease TnT results.  Serial cardiac troponin measurements are suggested.  Refer to the Links section for chest pain algorithms and additional  guidance. Performed at [facility], [address] [place], [place] [address]   CBC     Status: Abnormal   Collection Time: 01/26/24  9:44 AM  Result Value Ref Range   WBC 8.6 4.0 - 10.5 K/uL   RBC 2.43 (L) 4.22 - 5.81 MIL/uL   Hemoglobin 8.8 (L) 13.0 - 17.0 g/dL   HCT 70.8 (L) 60.9 - 47.9 %   MCV 119.8 (H) 80.0 - 100.0 fL   MCH 36.2 (H) 26.0 - 34.0 pg   MCHC 30.2 30.0 - 36.0 g/dL   RDW 81.3 (H) 88.4 - 84.4 %   Platelets 156 150 - 400 K/uL   nRBC  0.0 0.0 - 0.2 %    Comment: Performed at [facility], [address] [place], [place] [address]  Creatinine, serum     Status: Abnormal   Collection Time: 01/26/24  9:44 AM  Result Value Ref Range   Creatinine, Ser 6.30 (H) 0.61 - 1.24 mg/dL   GFR, Estimated 8 (L) >60 mL/min  Comment: (NOTE) Calculated using the CKD-EPI Creatinine Equation (2021) Performed at [facility], [address] [place], [place] [address]    DG Abdomen 1 View Result Date: 01/25/2024 CLINICAL DATA:  NG tube placement. EXAM: ABDOMEN - 1 VIEW COMPARISON:  September 07, 2023 FINDINGS: A nasogastric tube is seen with its distal tip overlying the expected region of the gastric antrum. The bowel gas pattern is normal. A left-sided percutaneous of proximally tube is in place. No radio-opaque calculi or other significant radiographic abnormality are seen. IMPRESSION: Nasogastric tube positioning, as described above. Electronically Signed   By: Suzen Dials M.D.   On: 01/25/2024 23:06   CT ABDOMEN PELVIS W CONTRAST Result Date: 01/25/2024 CLINICAL DATA:  Pain nausea reduced ostomy output EXAM: CT ABDOMEN AND PELVIS WITH CONTRAST TECHNIQUE: Multidetector CT imaging of the abdomen and pelvis was performed using the standard protocol following bolus administration of intravenous contrast. RADIATION DOSE REDUCTION: This exam was performed according to the departmental dose-optimization program which includes automated exposure control, adjustment of the mA and/or kV according to patient size and/or use of iterative reconstruction technique. CONTRAST:  OMNIPAQUE  IOHEXOL  300 MG/ML  SOLN COMPARISON:  CT 12/30/2023, 12/18/2023 FINDINGS: Lower chest: Lung bases demonstrate atelectasis or scarring at the bases. Cardiomegaly. Moderate pericardial effusion, increased compared to prior, measures up to 19 mm maximum thickness along the right cardiac border, and exhibits mild diffuse rim enhancement. Slight increased density  values within the fluid. Hepatobiliary: Gallstone. No focal hepatic abnormality or biliary dilatation Pancreas: Unremarkable. No pancreatic ductal dilatation or surrounding inflammatory changes. Spleen: Normal in size without focal abnormality. Adrenals/Urinary Tract: Adrenal glands are normal. Atrophic kidneys without hydronephrosis. Left percutaneous nephrostomy catheter with decreased collecting system. Renal cysts for which no imaging follow-up is recommended. Thick-walled collapsed appearance of the urinary bladder with mild perivesical stranding. Stomach/Bowel: Stomach is moderately distended with fluid. Interim development of multiple dilated fluid-filled loops of proximal to mid small bowel, measuring up to 4.4 cm. Fecalized small bowel in the pelvis. Adhesed appearance of small-bowel loops to the anterior abdominal wall below the level of umbilicus. Decompressed small bowel in the region and remaining distal small bowel is decompressed. Patient is status post colectomy with right abdominal ileostomy. Small parastomal hernia contains fat and a knuckle of small bowel. Vascular/Lymphatic: Aortic atherosclerosis. No enlarged abdominal or pelvic lymph nodes. Reproductive: Slightly enlarged prostate Other: No free air. Left trans gluteal percutaneous drainage catheter since the prior exam with pigtail in the presacral region at the site of prior fluid collection. Residual soft tissue thickening in the region with small residual fluid inferior to the pigtail measuring about 14 x 16 mm on series 2, image 71. Musculoskeletal: No acute osseous abnormality. 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 Dialysis graft in the left thigh and groin with similar small fluid collection in the left groin measuring 19 mm, previously 19 mm. Small fluid collection measuring 14 mm adjacent to the lateral/arterial limb of the graft in the thigh, stable since 2024. IMPRESSION: 1. Status post proctocolectomy with right  abdominal ileostomy. Small parastomal hernia contains fat and a knuckle of small bowel. Interim development of moderate to marked fluid distension of the stomach with multiple dilated fluid-filled loops of proximal to mid small bowel and fecalized bowel in the pelvis consistent with small bowel obstruction. Transition point appears to be within the low anterior pelvis where there is adhesed appearance of multiple small bowel loops. 2. Left trans gluteal percutaneous drainage catheter  with pigtail in the presacral region at the site of prior fluid collection. Residual soft tissue thickening in the region with small residual fluid collection inferior to the pigtail measuring about 14 x 16 mm. 3. Moderate pericardial effusion, increased compared to prior, exhibits mild diffuse rim enhancement. Slight increased density values within the fluid. Correlate for infection/pericarditis. 4. Gallstone. 5. Thick-walled collapsed appearance of the urinary bladder with mild perivesical stranding. 6. Left percutaneous nephrostomy catheter with no hydronephrosis. Atrophic native kidneys. Aortic Atherosclerosis (ICD10-I70.0). Electronically Signed   By: Luke Bun M.D.   On: 01/25/2024 20:40   DG Chest Port 1 View Result Date: 01/25/2024 CLINICAL DATA:  Concern for sepsis EXAM: PORTABLE CHEST 1 VIEW COMPARISON:  None Available. FINDINGS: 11/27/2023 stable enlarged cardiac silhouette. No effusion, infiltrate, pneumothorax. Mild scarring at LEFT lung base. RIGHT subclavian stent. IMPRESSION: 1. No acute cardiopulmonary process. 2. Cardiomegaly. Electronically Signed   By: Jackquline Boxer M.D.   On: 01/25/2024 19:36    Anti-infectives (From admission, onward)    Start     Dose/Rate Route Frequency Ordered Stop   01/25/24 2000  aztreonam  (AZACTAM ) 2 g in sodium chloride  0.9 % 100 mL IVPB        2 g 200 mL/hr over 30 Minutes Intravenous  Once 01/25/24 1958 01/25/24 2101   01/25/24 2000  metroNIDAZOLE  (FLAGYL ) IVPB 500 mg         500 mg 100 mL/hr over 60 Minutes Intravenous  Once 01/25/24 1958 01/25/24 2235   01/25/24 2000  vancomycin  (VANCOCIN ) IVPB 1000 mg/200 mL premix        1,000 mg 200 mL/hr over 60 Minutes Intravenous  Once 01/25/24 1958 01/26/24 0019       Assessment/Plan 88 yo male with multiple medical comorbidities, including UC s/p TAC with ileostomy, admitted with a recurrent small bowel obstruction. I personally reviewed his labs, imaging and notes. CT scan shows an SBO as well as a parastomal hernia, however there are no signs of strangulation of the loop of bowel within the hernia, and this appears decompressed and is not the source of obstruction. No indication for urgent surgical intervention. - NPO, IV fluid hydration - Place NG tube for decompression - Gastrografin  protocol - Surgery will follow  I reviewed nursing notes, last 24 h vitals and pain scores, last 48 h intake and output, last 24 h labs and trends, and last 24 h imaging results  Ozell CHRISTELLA Shaper, [facility] 01/26/2024, 11:18 AM Please see Amion for pager number during day hours 7:00am-4:30pm

## 2024-01-26 NOTE — Plan of Care (Addendum)
 This patient has been transferred to [facility] for the management of SBO.  He has a history of permanent A-fib on Eliquis , chronic hypotension on midodrine , ESRD, osteomyelitis and pericarditis in 11/2023 currently being managing for SBO.  Patient has history of chronic hypotension on midodrine  at home.  Blood pressure has been running low throughout the day and unable to absorb oral midodrine  given he is on SBO protocol. Received so far 2.8 L of LR bolus with sepsis protocol in the ED and albumin  50 g however systolic blood pressure is mildly upper 70s to 80s range.  Patient also has elevated troponin concern for demand ischemia and currently on heparin  drip as well. Echocardiogram showed moderate pericardial effusion.  In the setting of persistent hypotension, leukocytosis, tachycardia, elevated lactic acid patient meets sepsis criteria.  Pending blood culture result.  Reinitiating IV vancomycin  and cefepime  with pharmacy protocol.  Rechecking lactic acid level again and persistent low blood pressure giving another bolus of albumin  25 g.  Due to persistent low blood pressure spoke with ICU nurse practitioner-Christiana Claudene and ICU team will evaluate patient soon to decide about initiate pressor support.  Also consulted spoke with cardiology Robley Door regarding elevated troponin/demand ischemia NSTEMI patient on heparin  drip and pericardial effusion management.   Rayaan Garguilo, MD [facility] 01/26/2024, 6:55 PM

## 2024-01-26 NOTE — Plan of Care (Signed)

## 2024-01-26 NOTE — Consult Note (Addendum)
 Renal Service Consult Note [facility]  Joshua Riley 01/26/2024 Joshua JONETTA Fret, MD Requesting Physician: Dr. MICAEL Ada  Reason for Consult: ESRD pt w/ abd pain, N/V HPI: The patient is a 88 y.o. year-old w/ PMH as below who presented to ED yesterday evening c/o abd pain, N/V. In ED pt was tachycardic and hypotensive (acute on chronic), afeb, wBC 11K. LA was 2.6. Bcx's and resp panel were collected. CXR negative. CT A/P showed probable small bowel obstruction. L PCN in place as well. In ED pt rec'd IV aztreonam  and vancomycin , also 1.8 L IVF bolus, then LR at 150 cc/hr. Gen surg consulted and NG was recommended. Pt was admitted for sepsis and SBO. We are asked to see for dialysis.    Pt seen in room. No c/o's at this time. No SOB.    ROS - denies CP, no joint pain, no HA, no blurry vision, no rash, no diarrhea, no nausea/ vomiting   Past Medical History  Past Medical History:  Diagnosis Date   A-fib (HCC)    Anemia    Arthritis    Cancer (HCC)    Basal cell   COVID-19    2021   Dysrhythmia    Afib-controlled on eliquis    ESRD (end stage renal disease) (HCC) 10/22/2021   Glaucoma 11/18/2021   History of DVT (deep vein thrombosis)    Hydronephrosis    managed wtih a PCN   Idiopathic neuropathy 10/22/2021   lyrica     Ileostomy in place [facility])    Obstructive uropathy    With chronic left nephrostomy   Old retinal detachment, total or subtotal    Orthostatic hypotension 10/22/2021   Sleep apnea    does not need a machine   Stroke (HCC)    Ulcerative colitis (HCC)    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   Past Surgical History  Past Surgical History:  Procedure Laterality Date   BASAL CELL CARCINOMA EXCISION     10/23   COLON SURGERY     creation of j pouch     and subsequent takedown of j pouch   EYE SURGERY     IR CATHETER TUBE CHANGE  12/18/2023   IR NEPHROSTOMY EXCHANGE LEFT  12/10/2021   IR NEPHROSTOMY EXCHANGE LEFT   04/22/2022   IR NEPHROSTOMY EXCHANGE LEFT  07/29/2022   IR NEPHROSTOMY EXCHANGE LEFT  10/28/2022   IR NEPHROSTOMY EXCHANGE LEFT  02/05/2023   IR NEPHROSTOMY EXCHANGE LEFT  05/07/2023   IR NEPHROSTOMY EXCHANGE LEFT  06/25/2023   IR NEPHROSTOMY EXCHANGE LEFT  07/17/2023   IR NEPHROSTOMY EXCHANGE LEFT  10/22/2023   IR RADIOLOGIST EVAL & MGMT  11/25/2023   IR RADIOLOGIST EVAL & MGMT  12/18/2023   LEFT HEART CATH AND CORONARY ANGIOGRAPHY N/A 07/22/2022   Procedure: LEFT HEART CATH AND CORONARY ANGIOGRAPHY;  Surgeon: Swaziland, Peter M, MD;  Location: [facility] INVASIVE CV LAB;  Service: Cardiovascular;  Laterality: N/A;   REVISION OF ARTERIOVENOUS GORETEX GRAFT Left 05/06/2022   Procedure: REDO LEFT THIGH ARTERIOVENOUS 4-7 MM GORETEX GRAFT;  Surgeon: Eliza Lonni RAMAN, MD;  Location: [facility] OR;  Service: Vascular;  Laterality: Left;   SMALL INTESTINE SURGERY     TOTAL COLECTOMY     Family History  Family History  Problem Relation Age of Onset   Stroke Mother    Cancer Father    Esophageal cancer Brother    Social History  reports that he quit smoking about 40  years ago. His smoking use included cigarettes. He started smoking about 46 years ago. He has a 12 pack-year smoking history. He has never been exposed to tobacco smoke. He has never used smokeless tobacco. He reports current alcohol use of about 5.0 standard drinks of alcohol per week. He reports that he does not use drugs. Allergies  Allergies  Allergen Reactions   Baclofen Other (See Comments)    Altered mental status, after accidental overdose     Cephalosporins Rash   Ciprofloxacin Itching and Rash   Home medications Prior to Admission medications   Medication Sig Start Date End Date Taking? Authorizing Provider  albuterol  (PROVENTIL ) (2.5 MG/3ML) 0.083% nebulizer solution Take 2.5 mg by nebulization every 6 (six) hours as needed for wheezing or shortness of breath.    [provider]  albuterol  (VENTOLIN  HFA) 108 (90 Base) MCG/ACT  inhaler Inhale 1-2 puffs into the lungs every 6 (six) hours as needed for wheezing or shortness of breath.    [provider]  atorvastatin  (LIPITOR) 20 MG tablet Take 1 tablet (20 mg total) by mouth daily. 11/14/23   Sebastian Toribio GAILS, MD  budesonide  (PULMICORT ) 0.5 MG/2ML nebulizer solution Take 0.5 mg by nebulization 2 (two) times daily as needed (for respiratory flares).    [provider]  calcitRIOL  (ROCALTROL ) 0.5 MCG capsule Take 1 capsule (0.5 mcg total) by mouth every Monday, Wednesday, and Friday with hemodialysis. 12/04/23   Danford, Lonni SQUIBB, MD  Cyanocobalamin (VITAMIN B-12 PO) Take 1 capsule by mouth in the morning and at bedtime.    [provider]  dorzolamide  (TRUSOPT ) 2 % ophthalmic solution Place 1 drop into the right eye at bedtime. 06/17/23   [provider]  ELIQUIS  2.5 MG TABS tablet Take 1 tablet (2.5 mg total) by mouth 2 (two) times daily. 08/07/22   Hobart Powell BRAVO, MD  fludrocortisone  (FLORINEF ) 0.1 MG tablet Take 1 tablet (0.1 mg total) by mouth daily. 12/04/23   Danford, Lonni SQUIBB, MD  folic acid  (FOLVITE ) 1 MG tablet Take 1 tablet (1 mg total) by mouth daily. 07/21/23   Regalado, Belkys A, MD  gabapentin  (NEURONTIN ) 300 MG capsule Take 300 mg by mouth at bedtime. 09/09/23   [provider]  loperamide  (IMODIUM ) 2 MG capsule Take 1 capsule (2 mg total) by mouth as needed for diarrhea or loose stools. 11/13/23   Sebastian Toribio GAILS, MD  midodrine  (PROAMATINE ) 10 MG tablet Take 2 tablets (20 mg total) by mouth 3 (three) times daily with meals. 12/22/23   Katrinka Garnette KIDD, MD  mometasone -formoterol  (DULERA ) 200-5 MCG/ACT AERO Inhale 2 puffs into the lungs 2 (two) times daily. 09/08/23   Rosario Eland I, MD  multivitamin (RENA-VIT) TABS tablet Take 1 tablet by mouth daily.    [provider]  Omega Fatty Acids-Vitamins (OMEGA-3 GUMMIES) CHEW Chew 1 tablet by mouth in the morning and at bedtime.    [provider]  pantoprazole  (PROTONIX ) 40 MG tablet Take 1 tablet (40 mg total) by mouth 2 (two) times daily. 11/13/23   Sebastian Toribio GAILS, MD  ramelteon  (ROZEREM ) 8 MG tablet Take 1 tablet (8 mg total) by mouth at bedtime. 10/27/23   Katrinka Garnette KIDD, MD  sertraline  (ZOLOFT ) 50 MG tablet Take 25 mg by mouth at bedtime.    [provider]  sevelamer  carbonate (RENVELA ) 800 MG tablet Take 1,600 mg by mouth 3 (three) times daily with meals.    [provider]  Sodium Chloride  Flush (SALINE  FLUSH) 0.9 % SOLN Use 5 mLs by Intracatheter route daily as directed. 11/25/23   Caperilla, Marissa N, PA  timolol  (TIMOPTIC ) 0.5 % ophthalmic solution Place 1 drop into both eyes in the morning and at bedtime.    [provider]  traMADol  (ULTRAM ) 50 MG tablet Take 1 tablet (50 mg total) by mouth every 8 (eight) hours as needed (for pain). 12/03/23   Danford, Lonni SQUIBB, MD  TYLENOL  325 MG tablet Take 325-650 mg by mouth every 6 (six) hours as needed for mild pain (pain score 1-3) or headache.    [provider]     Vitals:   01/26/24 0500 01/26/24 0530 01/26/24 0600 01/26/24 0703  BP: (!) 86/65 91/69 (!) 89/63 98/67  Pulse: 86   84  Resp: 12 14 15 16   Temp:    98.3 F (36.8 C)  TempSrc:    Oral  SpO2: 98%   96%  Weight:    74.2 kg  Height:    5' 8 (1.727 m)   Exam Gen alert, no distress, not on O2 No rash, cyanosis or gangrene Sclera anicteric, throat clear  No jvd or bruits Chest clear bilat to bases, no rales/ wheezing RRR no MRG Abd soft ntnd no mass or ascites +bs GU deferred MS no joint effusions or deformity Ext 1+right PT edema, no LLE or UE edema Neuro is alert, Ox 3 , nf    L thigh AVG +bruit   Home bp meds: Florinef  0.1mg  every day Midodrine  20mg  tid   OP HD: [facility] MWF 2h  B350   71.5kg   2K bath   AVG   Heparin  none Rocaltrol  0.5 mcg po three times per week Mircera 225 mcg IV q 2 wks, last 7/21, due 8/04 Last OP HD 7/21, post wt  73.1kg   Assessment/ Plan: SBO: sp NG tube placement, per gen surg/ pmd. Getting IVF's for now. IV abx stopped.  Hypotension: acute on chronic, usual BP's at OP HD are in the 90s-100s. BP's here 70s-90s. Not real symptomatic. Midodrine  on hold. Use IV alb w/ HD prn.  ESRD: on HD MWF. Full HD yesterday. Next HD tomorrow.  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 Follow.  L percutaneous nephrostomy tube: not sure who is maintaining this, exit site is clean, not painful       Myer Fret  MD [facility] 01/26/2024, 4:04 PM  Recent Labs  Lab 01/25/24 1854 01/26/24 0944  HGB 10.2* 8.8*  ALBUMIN  4.4  --   CALCIUM  9.8  --   CREATININE 6.02* 6.30*  K 4.1  --    Inpatient medications:  [START ON 01/27/2024] calcitRIOL   0.5 mcg Per Tube Q M,W,F-HD   dorzolamide   1 drop Right Eye QHS   fluticasone  furoate-vilanterol  1 puff Inhalation Daily   multivitamin  1 tablet Per Tube Daily   sertraline   25 mg Per Tube QHS   sevelamer  carbonate  1,600 mg Per Tube TID WC   timolol   1 drop Both Eyes BID    acetaminophen      heparin      sodium chloride  Stopped (01/25/24 2103)   HYDROmorphone  (DILAUDID ) injection

## 2024-01-26 NOTE — ED Provider Notes (Signed)
 Patient's Troponin is elevated. He has had similar in the past attributed to pericarditis and he has a pericardial effusion noted on CT today. No difficulty breathing. Doubt tamponade given chronic hypotension and no distress.    Roselyn Carlin NOVAK, MD 01/26/24 [phone]

## 2024-01-27 ENCOUNTER — Inpatient Hospital Stay (HOSPITAL_COMMUNITY)

## 2024-01-27 DIAGNOSIS — R7989 Other specified abnormal findings of blood chemistry: Secondary | ICD-10-CM | POA: Diagnosis not present

## 2024-01-27 DIAGNOSIS — M7989 Other specified soft tissue disorders: Secondary | ICD-10-CM

## 2024-01-27 DIAGNOSIS — K56609 Unspecified intestinal obstruction, unspecified as to partial versus complete obstruction: Secondary | ICD-10-CM | POA: Diagnosis not present

## 2024-01-27 DIAGNOSIS — I482 Chronic atrial fibrillation, unspecified: Secondary | ICD-10-CM | POA: Diagnosis not present

## 2024-01-27 DIAGNOSIS — I3139 Other pericardial effusion (noninflammatory): Secondary | ICD-10-CM | POA: Diagnosis not present

## 2024-01-27 DIAGNOSIS — I959 Hypotension, unspecified: Secondary | ICD-10-CM | POA: Diagnosis not present

## 2024-01-27 DIAGNOSIS — N186 End stage renal disease: Secondary | ICD-10-CM | POA: Diagnosis not present

## 2024-01-27 DIAGNOSIS — Z992 Dependence on renal dialysis: Secondary | ICD-10-CM | POA: Diagnosis not present

## 2024-01-27 LAB — APTT
aPTT: 58 s — ABNORMAL HIGH (ref 24–36)
aPTT: 78 s — ABNORMAL HIGH (ref 24–36)

## 2024-01-27 LAB — CBC
HCT: 29.4 % — ABNORMAL LOW (ref 39.0–52.0)
Hemoglobin: 8.8 g/dL — ABNORMAL LOW (ref 13.0–17.0)
MCH: 36.4 pg — ABNORMAL HIGH (ref 26.0–34.0)
MCHC: 29.9 g/dL — ABNORMAL LOW (ref 30.0–36.0)
MCV: 121.5 fL — ABNORMAL HIGH (ref 80.0–100.0)
Platelets: 145 K/uL — ABNORMAL LOW (ref 150–400)
RBC: 2.42 MIL/uL — ABNORMAL LOW (ref 4.22–5.81)
RDW: 18.6 % — ABNORMAL HIGH (ref 11.5–15.5)
WBC: 7.5 K/uL (ref 4.0–10.5)
nRBC: 0 % (ref 0.0–0.2)

## 2024-01-27 LAB — COMPREHENSIVE METABOLIC PANEL WITH GFR
ALT: 16 U/L (ref 0–44)
AST: 19 U/L (ref 15–41)
Albumin: 3.1 g/dL — ABNORMAL LOW (ref 3.5–5.0)
Alkaline Phosphatase: 83 U/L (ref 38–126)
Anion gap: 18 — ABNORMAL HIGH (ref 5–15)
BUN: 54 mg/dL — ABNORMAL HIGH (ref 8–23)
CO2: 20 mmol/L — ABNORMAL LOW (ref 22–32)
Calcium: 8.7 mg/dL — ABNORMAL LOW (ref 8.9–10.3)
Chloride: 101 mmol/L (ref 98–111)
Creatinine, Ser: 7.5 mg/dL — ABNORMAL HIGH (ref 0.61–1.24)
GFR, Estimated: 7 mL/min — ABNORMAL LOW (ref >60)
Glucose, Bld: 131 mg/dL — ABNORMAL HIGH (ref 70–99)
Potassium: 5.4 mmol/L — ABNORMAL HIGH (ref 3.5–5.1)
Sodium: 139 mmol/L (ref 135–145)
Total Bilirubin: 1 mg/dL (ref 0.0–1.2)
Total Protein: 6.4 g/dL — ABNORMAL LOW (ref 6.5–8.1)

## 2024-01-27 LAB — GLUCOSE, CAPILLARY
Glucose-Capillary: 134 mg/dL — ABNORMAL HIGH (ref 70–99)
Glucose-Capillary: 137 mg/dL — ABNORMAL HIGH (ref 70–99)
Glucose-Capillary: 140 mg/dL — ABNORMAL HIGH (ref 70–99)
Glucose-Capillary: 130 mg/dL — ABNORMAL HIGH (ref 70–99)
Glucose-Capillary: 211 mg/dL — ABNORMAL HIGH (ref 70–99)
Glucose-Capillary: 187 mg/dL — ABNORMAL HIGH (ref 70–99)

## 2024-01-27 LAB — HEPARIN LEVEL (UNFRACTIONATED): Heparin Unfractionated: 0.9 [IU]/mL — ABNORMAL HIGH (ref 0.30–0.70)

## 2024-01-27 LAB — MAGNESIUM: Magnesium: 1.9 mg/dL (ref 1.7–2.4)

## 2024-01-27 MED ORDER — FLUDROCORTISONE ACETATE 0.1 MG PO TABS
0.1000 mg | ORAL_TABLET | Freq: Every day | ORAL | Status: DC
  Administered 2024-01-27: 0.1 mg via ORAL
  Filled 2024-01-27 (×2): qty 1

## 2024-01-27 MED ORDER — CALCITRIOL 0.5 MCG PO CAPS: 0.5000 ug | ORAL_CAPSULE | ORAL | Status: DC

## 2024-01-27 MED ORDER — ONDANSETRON HCL 4 MG/2ML IJ SOLN
4.0000 mg | Freq: Four times a day (QID) | INTRAMUSCULAR | Status: DC | PRN
  Administered 2024-01-27 – 2024-01-28 (×2): 4 mg via INTRAVENOUS
  Filled 2024-01-27 (×2): qty 2

## 2024-01-27 MED ORDER — RENA-VITE PO TABS: 1.0000 | ORAL_TABLET | Freq: Every day | ORAL | Status: DC

## 2024-01-27 MED ORDER — SERTRALINE HCL 50 MG PO TABS
25.0000 mg | ORAL_TABLET | Freq: Every day | ORAL | Status: DC
  Administered 2024-01-27: 25 mg via ORAL
  Filled 2024-01-27: qty 1

## 2024-01-27 MED ORDER — MIDODRINE HCL 5 MG PO TABS
20.0000 mg | ORAL_TABLET | Freq: Three times a day (TID) | ORAL | Status: DC
  Administered 2024-01-27 – 2024-01-28 (×2): 20 mg via ORAL
  Filled 2024-01-27 (×2): qty 4

## 2024-01-27 MED ORDER — SEVELAMER CARBONATE 800 MG PO TABS
1600.0000 mg | ORAL_TABLET | Freq: Three times a day (TID) | ORAL | Status: DC
Start: 2024-01-28 — End: 2024-01-28
  Administered 2024-01-28: 1600 mg via ORAL
  Filled 2024-01-27: qty 2

## 2024-01-27 NOTE — Progress Notes (Signed)
 BLE venous exam is completed. Teffany Blaszczyk, RVT

## 2024-01-27 NOTE — Progress Notes (Incomplete)
 Attending note: I have seen and examined the patient. History, labs and imaging reviewed.  88 Y/O with PMH of ESRD, dCHF, afib on eliquis , ulcerative colitis, CVA. Admitted for SBO being followed by surgery. Transferred to ICU for hypotension requiring levophed . Cardiology is on board for pericardial effusion without tamponade  Blood pressure (!) 80/60, pulse 77, temperature 97.8 F (36.6 C), temperature source Oral, resp. rate (!) 22, height 5' 8 (1.727 m), weight 74.2 kg, SpO2 100%. Gen:      No acute distress HEENT:  EOMI, sclera anicteric Neck:     No masses; no thyromegaly Lungs:    Clear to auscultation bilaterally; normal respiratory effort*** CV:         Regular rate and rhythm; no murmurs Abd:      + bowel sounds; soft, non-tender; no palpable masses, no distension Ext:    No edema; adequate peripheral perfusion Neuro: alert and oriented x 3 Psych: normal mood and affect   Labs/Imaging personally reviewed, significant for   Assessment/plan: Acute on chronic hypotension due to adrenal insufficiency Unable to take midodrine  On stress dose steroids. Was on florinef  as outpatient Got albumin  overnight  SBO Had a bowel movement Conservative management per surgery Abd x ray today  ESRD on HD Obstructive uropathy-s/p left PCN placement since 2023 in [place]  Dialysis per nephrology  Chronic pelvic fluid collection previous drain placed by IR 07/13/23 and fell out 08/2023.  Patient underwent left transgluteal drain placement with IR 11/11/23 and 01/07/2023 due to a chronic pre sacral fluid collection. Culture at the time of drain replacement was positive for ESBL E coli and Klebsiella  The patient is critically ill with multiple organ systems failure and requires high complexity decision making for assessment and support, frequent evaluation and titration of therapies, application of advanced monitoring technologies and extensive interpretation of multiple databases.  Critical care  time - 35 mins. This represents my time independent of the NPs time taking care of the pt.  Gautam Langhorst MD [facility] 01/27/2024, 9:08 AM

## 2024-01-27 NOTE — Plan of Care (Signed)
  Problem: Education: Goal: Knowledge of General Education information will improve Description: Including pain rating scale, medication(s)/side effects and non-pharmacologic comfort measures Outcome: Progressing   Problem: Clinical Measurements: Goal: Ability to maintain clinical measurements within normal limits will improve Outcome: Progressing Goal: Will remain free from infection Outcome: Progressing Goal: Diagnostic test results will improve Outcome: Progressing Goal: Respiratory complications will improve Outcome: Progressing Goal: Cardiovascular complication will be avoided Outcome: Progressing   Problem: Activity: Goal: Risk for activity intolerance will decrease Outcome: Progressing   Problem: Coping: Goal: Level of anxiety will decrease Outcome: Progressing   Problem: Elimination: Goal: Will not experience complications related to urinary retention Outcome: Progressing   Problem: Pain Managment: Goal: General experience of comfort will improve and/or be controlled Outcome: Progressing   Problem: Safety: Goal: Ability to remain free from injury will improve Outcome: Progressing   Problem: Skin Integrity: Goal: Risk for impaired skin integrity will decrease Outcome: Progressing

## 2024-01-27 NOTE — Progress Notes (Signed)
 NAME:  Joshua Riley, MRN:  [id number], DOB:  [birth year], LOS: 1 ADMISSION DATE:  01/25/2024, CONSULTATION DATE:  7/22 REFERRING MD:  Dr. Lee, CHIEF COMPLAINT:  hypotension; sbo   History of Present Illness:  Patient is a 88 year old male with pertinent PMH ESRD on HD MWF, dCHF, A-fib on Eliquis , ulcerative colitis w/ ileostomy, CVA, chronic sacral osteomyelitis (recently completed ertapenem  and vanc), orthostatic hypotension presents to [facility] on 7/22 with SBO.   Patient having abd on 7/21 that progressively worsened w/ N/V came to [facility] ED later that night. On arrival patient tachycardic and hypotensive. Patient afebrile and wbc 11.  Cultures obtained, given iv fluids, started on aztreonam  and vanc. LA 2.6 then 1.6. PCT 2.3. CXR no acute findings. Covid/flu/rsv negative. CT abd/pelvis w/ small parastomal hernia contains fat and a knuckle of small bowel; Interim development of moderate to marked fluid distension of the stomach with multiple dilated fluid-filled loops of proximal to mid small bowel and fecalized bowel in the pelvis consistent with small bowel obstruction; moderate pericardial effusion. Surgery consulted. NG tube placed and made npo. Trop 250. Echo lvef 60-65%; moderate pericardial effusion; RA pressure 15. Cards consulted. On 7/22 patient remained hypotensive despite iv fluids. Unable to give home midodrine /florinef  while npo. PCCM consulted.   Pertinent  Medical History       Past Medical History:  Diagnosis Date   A-fib (HCC)     Anemia     Arthritis     Cancer (HCC)      Basal cell   COVID-19      2021   Dysrhythmia      Afib-controlled on eliquis    ESRD (end stage renal disease) (HCC) 10/22/2021   Glaucoma 11/18/2021   History of DVT (deep vein thrombosis)     Hydronephrosis      managed wtih a PCN   Idiopathic neuropathy 10/22/2021    lyrica     Ileostomy in place [facility])     Obstructive uropathy      With chronic left nephrostomy   Old retinal detachment, total  or subtotal     Orthostatic hypotension 10/22/2021   Sleep apnea      does not need a machine   Stroke (HCC)     Ulcerative colitis (HCC)     Ureteral stricture      secondary to injury during surg     Significant Hospital Events: Including procedures, antibiotic start and stop dates in addition to other pertinent events   7/21 admit w/ sbo and hypotension 7/22 remains hypotensive despite fluids; pccm consulted  Interim History / Subjective:  Had a bowel movement this am per ileostomy. Doesn't feel symptoms of his hypotension. Stable of levophed .  Objective    Blood pressure (!) 82/56, pulse 68, temperature 97.7 F (36.5 C), temperature source Oral, resp. rate 19, height 5' 8 (1.727 m), weight 74.2 kg, SpO2 91%.        Intake/Output Summary (Last 24 hours) at 01/27/2024 1143 Last data filed at 01/27/2024 1100 Gross per 24 hour  Intake 2081.7 ml  Output 950 ml  Net 1131.7 ml   Filed Weights   01/25/24 1849 01/26/24 0703  Weight: 72.6 kg 74.2 kg   Examination: General:  NAD HEENT: MM pink/moist; [place] and ng tube in place Neuro: Aox3; perrl CV: s1s2, irregularly irregular rate 70-80s, no m/r/g PULM:  trace rales in the bases; [place] 3L sats 97% but tends to fall off, desaturates with movement GI: soft and not distended; bs  present; ileostomy in place Extremities: warm/dry, RLE slightly more swelling and some erythema than LLE; left femoral fistula w/ no drainage/erythema  Resolved problem list   Assessment and Plan   A on C Hypotension without shock History hypotension, adrenal insufficiency Pt takes midodrine  and florinef  out of hospital but unable given SBO. MAPs below 65 at admission despite fluids. No lactic acidosis, no mentation changes, pt feels his current levels are WNL, although currently he is on low dose norepi. Warm on exam. - Monitor in ICU - Pressers titration adjusted for BP goals for systolic above 80 - stress steroids - resume po medicines ASAP upon  diet advance   SBO Hx of ulcerative colitis w/ ileostomy Surgery following, thank you. BM per ileostomy today. Will defer to surgery. Anticipate XR and possible diet advancement. Remains NPO for now and NG to LIWS. -pain management prn. But pain is improved   Chronic osteomyelitis Chronic Presacral fluid collection with cultures + for E Coli ESBL and Klebsiella Percutaneous drain - chronic presacral fluid collection Started on broad abx for the above and in setting of his hypotension. At this point, afebrile, resolving WBCs, no blood culture growth x 2 days. Have discussed this with ID service who know him well. Anticipate stopping all antibiotics today. He is not septic.  ESRD on HD Concern for volume overload with AHRF on 3L [place] and rales on exam L nephrostomy tube Nephro following, thank you. Anticpate regular dialysis today for volume control.  A-fib on Eliquis  dCHF Heparin  gtt and tele monitoring.   Anemia of chronic disease At baseline.  Moderate pericardial effusion Elevated Troponin: likely demand ischemia Cardiology following, thank you. Currently no indication for pericardiocentesis. Might benefit from repeat echo in a few days.   RLE swelling Also with some warmth/erythema (cellulitis?) -dvt us  pending   Hx of CVA Plan: -hold statin while npo   Hx of copd Denies baseline O2 requirement. on dulera  -wean [place] for sats 88-92% -breo-ellipta; prn duoneb   Depression -hold home zoloft    Peripheral neuropathy -hold gaba while npo  Best Practice (right click and Reselect all SmartList Selections daily)   Diet/type: NPO DVT prophylaxis systemic heparin  Pressure ulcer(s): pressure ulcer assessment deferred  GI prophylaxis: N/A Lines: N/A Foley:  N/A Code Status:  full code Last date of multidisciplinary goals of care discussion [7/22 patient updated at bedside]  Labs   CBC: Recent Labs  Lab 01/25/24 1854 01/26/24 0944 01/27/24 0435  WBC 11.0* 8.6 7.5   NEUTROABS 9.6*  --   --   HGB 10.2* 8.8* 8.8*  HCT 33.2* 29.1* 29.4*  MCV 119.0* 119.8* 121.5*  PLT 233 156 145*   Basic Metabolic Panel: Recent Labs  Lab 01/25/24 1854 01/26/24 0944 01/26/24 2116 01/27/24 1013  NA 142  --   --  139  K 4.1  --   --  5.4*  CL 98  --   --  101  CO2 26  --   --  20*  GLUCOSE 154*  --   --  131*  BUN 33*  --   --  54*  CREATININE 6.02* 6.30*  --  7.50*  CALCIUM  9.8  --   --  8.7*  MG  --   --  1.9 1.9   GFR: Estimated Creatinine Clearance: 7 mL/min (A) (by C-G formula based on SCr of 7.5 mg/dL (H)). Recent Labs  Lab 01/25/24 8145 01/25/24 1907 01/25/24 2101 01/26/24 9055 01/26/24 1650 01/26/24 1926 01/26/24 2116 01/27/24 0435  PROCALCITON  --   --   --  2.30  --   --   --   --   WBC 11.0*  --   --  8.6  --   --   --  7.5  LATICACIDVEN  --    < > 1.6  --  1.6 1.2 1.5  --    < > = values in this interval not displayed.   Liver Function Tests: Recent Labs  Lab 01/25/24 1854 01/27/24 1013  AST 28 19  ALT 27 16  ALKPHOS 144* 83  BILITOT 0.7 1.0  PROT 7.9 6.4*  ALBUMIN  4.4 3.1*   No results for input(s): LIPASE, AMYLASE in the last 168 hours. No results for input(s): AMMONIA in the last 168 hours.  ABG    Component Value Date/Time   TCO2 25 05/06/2022 0621    Coagulation Profile: Recent Labs  Lab 01/25/24 1854  INR 1.3*   Cardiac Enzymes: No results for input(s): CKTOTAL, CKMB, CKMBINDEX, TROPONINI in the last 168 hours.  HbA1C: Hgb A1c MFr Bld  Date/Time Value Ref Range Status  11/09/2023 09:52 PM 5.9 (H) 4.8 - 5.6 % Final    Comment:    (NOTE) Pre diabetes:          5.7%-6.4%  Diabetes:              >6.4%  Glycemic control for   <7.0% adults with diabetes    CBG: Recent Labs  Lab 01/26/24 2203 01/26/24 2321 01/27/24 0325 01/27/24 0717 01/27/24 1125  GLUCAP 119* 114* 134* 137* 140*   Review of Systems:   Denies pain, dyspnea, abdominal pressure, N/V. Had a BM per ileostomy this am.  Shares that he would love to go home. Adamant that his blood pressure runs low and that current levels are not atypical.  Past Medical History:  He,  has a past medical history of A-fib (HCC), Anemia, Arthritis, Cancer (HCC), COVID-19, Dysrhythmia, ESRD (end stage renal disease) (HCC) (10/22/2021), Glaucoma (11/18/2021), History of DVT (deep vein thrombosis), Hydronephrosis, Idiopathic neuropathy (10/22/2021), Ileostomy in place [facility]), Obstructive uropathy, Old retinal detachment, total or subtotal, Orthostatic hypotension (10/22/2021), Sleep apnea, Stroke (HCC), Ulcerative colitis (HCC), and Ureteral stricture.   Surgical History:   Past Surgical History:  Procedure Laterality Date   BASAL CELL CARCINOMA EXCISION     10/23   COLON SURGERY     creation of j pouch     and subsequent takedown of j pouch   EYE SURGERY     IR CATHETER TUBE CHANGE  12/18/2023   IR NEPHROSTOMY EXCHANGE LEFT  12/10/2021   IR NEPHROSTOMY EXCHANGE LEFT  04/22/2022   IR NEPHROSTOMY EXCHANGE LEFT  07/29/2022   IR NEPHROSTOMY EXCHANGE LEFT  10/28/2022   IR NEPHROSTOMY EXCHANGE LEFT  02/05/2023   IR NEPHROSTOMY EXCHANGE LEFT  05/07/2023   IR NEPHROSTOMY EXCHANGE LEFT  06/25/2023   IR NEPHROSTOMY EXCHANGE LEFT  07/17/2023   IR NEPHROSTOMY EXCHANGE LEFT  10/22/2023   IR RADIOLOGIST EVAL & MGMT  11/25/2023   IR RADIOLOGIST EVAL & MGMT  12/18/2023   LEFT HEART CATH AND CORONARY ANGIOGRAPHY N/A 07/22/2022   Procedure: LEFT HEART CATH AND CORONARY ANGIOGRAPHY;  Surgeon: Swaziland, Peter M, MD;  Location: [facility] INVASIVE CV LAB;  Service: Cardiovascular;  Laterality: N/A;   REVISION OF ARTERIOVENOUS GORETEX GRAFT Left 05/06/2022   Procedure: REDO LEFT THIGH ARTERIOVENOUS 4-7 MM GORETEX GRAFT;  Surgeon: Eliza Lonni RAMAN, MD;  Location: [facility];  Service: Vascular;  Laterality: Left;   SMALL INTESTINE SURGERY     TOTAL COLECTOMY      Social History:   reports that he quit smoking about 40 years ago. His smoking use included  cigarettes. He started smoking about 46 years ago. He has a 12 pack-year smoking history. He has never been exposed to tobacco smoke. He has never used smokeless tobacco. He reports current alcohol use of about 5.0 standard drinks of alcohol per week. He reports that he does not use drugs.   Family History:  His family history includes Cancer in his father; Esophageal cancer in his brother; Stroke in his mother.   Allergies Allergies  Allergen Reactions   Baclofen Other (See Comments)    Altered mental status, after accidental overdose     Cephalosporins Rash   Ciprofloxacin Itching and Rash    Home Medications  Prior to Admission medications   Medication Sig Start Date End Date Taking? Authorizing Provider  albuterol  (PROVENTIL ) (2.5 MG/3ML) 0.083% nebulizer solution Take 2.5 mg by nebulization every 6 (six) hours as needed for wheezing or shortness of breath.    [provider]  albuterol  (VENTOLIN  HFA) 108 (90 Base) MCG/ACT inhaler Inhale 1-2 puffs into the lungs every 6 (six) hours as needed for wheezing or shortness of breath.    [provider]  atorvastatin  (LIPITOR) 20 MG tablet Take 1 tablet (20 mg total) by mouth daily. 11/14/23   Sebastian Toribio GAILS, MD  budesonide  (PULMICORT ) 0.5 MG/2ML nebulizer solution Take 0.5 mg by nebulization 2 (two) times daily as needed (for respiratory flares).    [provider]  calcitRIOL  (ROCALTROL ) 0.5 MCG capsule Take 1 capsule (0.5 mcg total) by mouth every Monday, Wednesday, and Friday with hemodialysis. 12/04/23   Danford, Lonni SQUIBB, MD  Cyanocobalamin (VITAMIN B-12 PO) Take 1 capsule by mouth in the morning and at bedtime.    [provider]  dorzolamide  (TRUSOPT ) 2 % ophthalmic solution Place 1 drop into the right eye at bedtime. 06/17/23   [provider]  ELIQUIS  2.5 MG TABS tablet Take 1 tablet (2.5 mg total) by mouth 2 (two) times daily. 08/07/22   Hobart Powell BRAVO, MD  fludrocortisone   (FLORINEF ) 0.1 MG tablet Take 1 tablet (0.1 mg total) by mouth daily. 12/04/23   Danford, Lonni SQUIBB, MD  folic acid  (FOLVITE ) 1 MG tablet Take 1 tablet (1 mg total) by mouth daily. 07/21/23   Regalado, Belkys A, MD  gabapentin  (NEURONTIN ) 300 MG capsule Take 300 mg by mouth at bedtime. 09/09/23   [provider]  loperamide  (IMODIUM ) 2 MG capsule Take 1 capsule (2 mg total) by mouth as needed for diarrhea or loose stools. 11/13/23   Sebastian Toribio GAILS, MD  midodrine  (PROAMATINE ) 10 MG tablet Take 2 tablets (20 mg total) by mouth 3 (three) times daily with meals. 12/22/23   Katrinka Garnette KIDD, MD  mometasone -formoterol  (DULERA ) 200-5 MCG/ACT AERO Inhale 2 puffs into the lungs 2 (two) times daily. 09/08/23   Rosario Eland I, MD  multivitamin (RENA-VIT) TABS tablet Take 1 tablet by mouth daily.    [provider]  Omega Fatty Acids-Vitamins (OMEGA-3 GUMMIES) CHEW Chew 1 tablet by mouth in the morning and at bedtime.    [provider]  pantoprazole  (PROTONIX ) 40 MG tablet Take 1 tablet (40 mg total) by mouth 2 (two) times daily. 11/13/23   Sebastian Toribio GAILS, MD  ramelteon  (ROZEREM ) 8 MG tablet Take 1 tablet (8 mg total) by mouth at bedtime. 10/27/23  Katrinka Garnette KIDD, MD  sertraline  (ZOLOFT ) 50 MG tablet Take 25 mg by mouth at bedtime.    [provider]  sevelamer  carbonate (RENVELA ) 800 MG tablet Take 1,600 mg by mouth 3 (three) times daily with meals.    [provider]  Sodium Chloride  Flush (SALINE FLUSH) 0.9 % SOLN Use 5 mLs by Intracatheter route daily as directed. 11/25/23   Caperilla, Marissa N, PA  timolol  (TIMOPTIC ) 0.5 % ophthalmic solution Place 1 drop into both eyes in the morning and at bedtime.    [provider]  traMADol  (ULTRAM ) 50 MG tablet Take 1 tablet (50 mg total) by mouth every 8 (eight) hours as needed (for pain). 12/03/23   Danford, Lonni SQUIBB, MD  TYLENOL  325 MG tablet Take 325-650 mg by mouth every 6 (six) hours as needed  for mild pain (pain score 1-3) or headache.    [provider]    Critical care time: 35 mins    Lonni Africa, DO IM Resident PGY-2

## 2024-01-27 NOTE — Progress Notes (Signed)
 Pt adamant about leaving. DR Harrie and PA Maczis notified. Dr Harrie advised pt on risks of leaving and pt still wants to leave. Dan, at bedside. After much conversation, pt decided to stay only one night. States he is leaving tomorrow.

## 2024-01-27 NOTE — Consult Note (Signed)
 [facility]    Date of Admission:  01/25/2024     Total days of antibiotics 3               Reason for Consult: Presacral fluid collection  Referring Provider: Dr. Theophilus Primary Care Provider: Katrinka Garnette KIDD, MD   ASSESSMENT:  Joshua Riley is an 88 y/o caucasian male with history of presacral fluid collection treated for ESBL E. Coli with meropenem  in May 2025 presenting with abdominal pain, nausea and vomiting and found to have small bowel obstruction treated non-surgically. Presacral fluid collection appears improved from previous imaging and drain placement. Discussed recommendations to stop antibiotics and continue with drain management per IR given improvements. No additional follow up with ID needed at this time. SBO management per General Surgery and primary team. Remaining medical and supportive care per PCCM.   PLAN:  Stop antibiotics.  Drain management per IR.  SBO management per General Surgery and Primary Team. Remaining medical and supportive care per PCCM.    Principal Problem:   Small bowel obstruction (HCC) Active Problems:   Hypotension   Septic shock (HCC)   ESRD on hemodialysis (HCC)    calcitRIOL   0.5 mcg Per Tube Q M,W,F-HD   Chlorhexidine  Gluconate Cloth  6 each Topical Q0600   dorzolamide   1 drop Right Eye QHS   fluticasone  furoate-vilanterol  1 puff Inhalation Daily   hydrocortisone  sod succinate (SOLU-CORTEF ) inj  100 mg Intravenous Q8H   multivitamin  1 tablet Per Tube Daily   pantoprazole  (PROTONIX ) IV  40 mg Intravenous Q24H   sertraline   25 mg Per Tube QHS   sevelamer  carbonate  1,600 mg Per Tube TID WC   timolol   1 drop Both Eyes BID     HPI: Joshua Riley is a 88 y.o. male with previous medical history of ESRD on HD, atrial fibrillation, ulcerative colitis, chronic sacral osteomyelitis and presacral fluid collection presenting with abdominal pain, nausea and vomiting .  Joshua Riley is know to the ID  service having last been seen by Dr. Overton on 12/02/23 during hospitalization from recurrent sacral abscess s/p drain placement. Completed course of vancomycin  and meropenem  for ESBL E. Coli and drain had fallen out. No additional antibiotics were recommended at that time. New drain placement occurred on 12/18/23. Clinic notified drain had fallen out on 12/28/23. Replaced once again on 01/07/24. Has not been on antibiotics since he was last seen.   Joshua Riley is now presenting to the ED with abdominal pain, nausea and vomiting. Afebrile on arrival with WBC count of 11,000.  Chest x-ray with no acute cardiopulmonary process.  CT abdomen/pelvis with marked fluid distention of the stomach with multiple dilated fluid-filled loops of proximal to mid small bowel consistent with small bowel obstruction and left transgluteal percutaneous drainage catheter with pigtail in the presacral region at the site of prior fluid collection with residual soft tissue thickening in the region with small residual fluid collection inferior to the pigtail.  No surgical intervention recommended and treated with NG tube and bowel rest.  Restarted on broad-spectrum coverage with vancomycin , aztreonam , and metronidazole .  Joshua Riley has remained without fevers since admission with blood cultures without growth. Requesting something to drink. No fevers, chills or sweats. Overall symptoms improved. ID has been consulted for antibiotic recommendations.    Review of Systems: Review of Systems  Constitutional:  Negative for chills, fever and weight loss.  Respiratory:  Negative for cough, shortness of  breath and wheezing.   Cardiovascular:  Negative for chest pain and leg swelling.  Gastrointestinal:  Negative for abdominal pain, constipation, diarrhea, nausea and vomiting.  Skin:  Negative for rash.     Past Medical History:  Diagnosis Date   A-fib (HCC)    Anemia    Arthritis    Cancer (HCC)    Basal cell   COVID-19    2021    Dysrhythmia    Afib-controlled on eliquis    ESRD (end stage renal disease) (HCC) 10/22/2021   Glaucoma 11/18/2021   History of DVT (deep vein thrombosis)    Hydronephrosis    managed wtih a PCN   Idiopathic neuropathy 10/22/2021   lyrica     Ileostomy in place [facility])    Obstructive uropathy    With chronic left nephrostomy   Old retinal detachment, total or subtotal    Orthostatic hypotension 10/22/2021   Sleep apnea    does not need a machine   Stroke (HCC)    Ulcerative colitis (HCC)    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    Social History   Tobacco Use   Smoking status: Former    Current packs/day: 0.00    Average packs/day: 2.0 packs/day for 6.0 years (12.0 ttl pk-yrs)    Types: Cigarettes    Start date: 51    Quit date: 36    Years since quitting: 40.5    Passive exposure: Never   Smokeless tobacco: Never  Vaping Use   Vaping status: Never Used  Substance Use Topics   Alcohol use: Yes    Alcohol/week: 5.0 standard drinks of alcohol    Types: 5 Shots of liquor per week    Comment: socially   Drug use: Never    Family History  Problem Relation Age of Onset   Stroke Mother    Cancer Father    Esophageal cancer Brother     Allergies  Allergen Reactions   Baclofen Other (See Comments)    Altered mental status, after accidental overdose     Cephalosporins Rash   Ciprofloxacin Itching and Rash    OBJECTIVE: Blood pressure (!) 81/68, pulse 76, temperature 97.7 F (36.5 C), temperature source Oral, resp. rate 16, height 5' 8 (1.727 m), weight 74.2 kg, SpO2 (!) 86%.  Physical Exam Constitutional:      General: He is not in acute distress.    Appearance: He is well-developed.  Cardiovascular:     Rate and Rhythm: Normal rate and regular rhythm.     Heart sounds: Normal heart sounds.  Pulmonary:     Effort: Pulmonary effort is normal.     Breath sounds: Normal breath sounds.  Skin:    General: Skin is warm and dry.   Neurological:     Mental Status: He is alert.     Lab Results Lab Results  Component Value Date   WBC 7.5 01/27/2024   HGB 8.8 (L) 01/27/2024   HCT 29.4 (L) 01/27/2024   MCV 121.5 (H) 01/27/2024   PLT 145 (L) 01/27/2024    Lab Results  Component Value Date   CREATININE 7.50 (H) 01/27/2024   BUN 54 (H) 01/27/2024   NA 139 01/27/2024   K 5.4 (H) 01/27/2024   CL 101 01/27/2024   CO2 20 (L) 01/27/2024    Lab Results  Component Value Date   ALT 16 01/27/2024   AST 19 01/27/2024   ALKPHOS 83 01/27/2024   BILITOT 1.0 01/27/2024  Microbiology: Recent Results (from the past 240 hours)  Culture, blood (Routine x 2)     Status: None (Preliminary result)   Collection Time: 01/25/24  7:07 PM   Specimen: Right Antecubital; Blood  Result Value Ref Range Status   Specimen Description   Final    RIGHT ANTECUBITAL Performed at [facility], [address] [place], [place] [address]    Special Requests   Final    BOTTLES DRAWN AEROBIC AND ANAEROBIC Blood Culture results may not be optimal due to an inadequate volume of blood received in culture bottles Performed at [facility], [address], [place], [place] [address]    Culture   Final    NO GROWTH 2 DAYS Performed at [facility], [address] [place], [place] [address]    Report Status PENDING  Incomplete  Culture, blood (Routine x 2)     Status: None (Preliminary result)   Collection Time: 01/25/24  7:09 PM   Specimen: Left Antecubital; Blood  Result Value Ref Range Status   Specimen Description   Final    LEFT ANTECUBITAL Performed at [facility], [address], [place], [place] [address]    Special Requests   Final    BOTTLES DRAWN AEROBIC AND ANAEROBIC Blood Culture adequate volume Performed at [facility], [address] [place], [place] [address]    Culture   Final    NO GROWTH 2 DAYS Performed at [facility], [address], [place], [place] [address]    Report Status PENDING  Incomplete  Resp panel by RT-PCR (RSV, Flu A&B, Covid) Anterior Nasal Swab     Status: None   Collection Time: 01/25/24  9:01 PM   Specimen: Anterior Nasal Swab  Result Value Ref Range Status   SARS Coronavirus 2 by RT PCR NEGATIVE NEGATIVE Final    Comment: (NOTE) SARS-CoV-2 target nucleic acids are NOT DETECTED.  The SARS-CoV-2 RNA is generally detectable in upper respiratory specimens during the acute phase of infection. The lowest concentration of SARS-CoV-2 viral copies this assay can detect is 138 copies/mL. A negative result does not preclude SARS-Cov-2 infection and should not be used as the sole basis for treatment or other patient management decisions. A negative result may occur with  improper specimen collection/handling, submission of specimen other than nasopharyngeal swab, presence of viral mutation(s) within the areas targeted by this assay, and inadequate number of viral copies(<138 copies/mL). A negative result must be combined with clinical observations, patient history, and epidemiological information. The expected result is Negative.  Fact Sheet for Patients:  BloggerCourse.com  Fact Sheet for Healthcare Providers:  SeriousBroker.it  This test is no t yet approved or cleared by the [place]  FDA and  has been authorized for detection and/or diagnosis of SARS-CoV-2 by FDA under an Emergency Use Authorization (EUA). This EUA will remain  in effect (meaning this test can be used) for the duration of the COVID-19 declaration under Section 564(b)(1) of the Act, 21 U.S.C.section 360bbb-3(b)(1), unless the authorization is terminated  or revoked sooner.       Influenza A by PCR NEGATIVE NEGATIVE Final   Influenza B by PCR NEGATIVE NEGATIVE Final    Comment: (NOTE) The Xpert Xpress SARS-CoV-2/FLU/RSV plus assay is intended as an  aid in the diagnosis of influenza from Nasopharyngeal swab specimens and should not be used as a sole basis for treatment. Nasal washings and aspirates are unacceptable for Xpert Xpress SARS-CoV-2/FLU/RSV testing.  Fact Sheet for Patients: BloggerCourse.com  Fact Sheet for Healthcare Providers: SeriousBroker.it  This test is not yet approved or cleared by the [place]  FDA and has been authorized for detection and/or diagnosis of SARS-CoV-2 by FDA under an Emergency Use Authorization (EUA). This EUA will remain in effect (meaning this test can be used) for the duration of the COVID-19 declaration under Section 564(b)(1) of the Act, 21 U.S.C. section 360bbb-3(b)(1), unless the authorization is terminated or revoked.     Resp Syncytial Virus by PCR NEGATIVE NEGATIVE Final    Comment: (NOTE) Fact Sheet for Patients: BloggerCourse.com  Fact Sheet for Healthcare Providers: SeriousBroker.it  This test is not yet approved or cleared by the [place]  FDA and has been authorized for detection and/or diagnosis of SARS-CoV-2 by FDA under an Emergency Use Authorization (EUA). This EUA will remain in effect (meaning this test can be used) for the duration of the COVID-19 declaration under Section 564(b)(1) of the Act, 21 U.S.C. section 360bbb-3(b)(1), unless the authorization is terminated or revoked.  Performed at [facility], [address] [place], [place] [address]   MRSA Next Gen by PCR, Nasal     Status: None   Collection Time: 01/26/24 10:09 PM   Specimen: Nasal Mucosa; Nasal Swab  Result Value Ref Range Status   MRSA by PCR Next Gen NOT DETECTED NOT DETECTED Final    Comment: (NOTE) The GeneXpert MRSA Assay (FDA approved for NASAL specimens only), is one component of a comprehensive MRSA colonization surveillance program. It is not intended to  diagnose MRSA infection nor to guide or monitor treatment for MRSA infections. Test performance is not FDA approved in patients less than 41 years old. Performed at [facility], [address] [place], [place] [address]     I have personally spent 30 minutes involved in face-to-face and non-face-to-face activities for this patient on the day of the visit. Professional time spent includes the following activities: preparing to see the patient (review of tests), obtaining and reviewing separately obtained history (admission/discharge record), performing a medically appropriate examination, ordering medications, communicating with other health care professionals, documenting clinical information in the EMR, communicating results and counseling patient and family regarding medication and plan of care, and care coordination.    Greg Catie Chiao, NP [facility]  01/27/2024  1:36 PM

## 2024-01-27 NOTE — Progress Notes (Signed)
 [facility] Progress Note  Subjective:  Seen in ICU, no new c/o's BP's in 80s on low dose levo gtt  Vitals:   01/27/24 1130 01/27/24 1145 01/27/24 1200 01/27/24 1215  BP: (!) 89/63 (!) 83/59 (!) 86/49 (!) 81/68  Pulse: 67 66 69 76  Resp: 18 13 12 16   Temp:      TempSrc:      SpO2: 91% 92% (!) 82% (!) 86%  Weight:      Height:        Exam: Gen alert, no distress, not on O2 No jvd or bruits Chest clear bilat to bases RRR no RG Abd soft ntnd no mass or ascites +bs, RLQ ostomy bag Ext trace pretib edema bilat Neuro is alert, Ox 3 , nf    L thigh AVG +bruit    Home bp meds: Florinef  0.1mg  every day Midodrine  20mg  tid    OP HD: NW MWF 2h  B350   71.5kg   2K bath   AVG   Heparin  none Rocaltrol  0.5 mcg po three times per week Mircera 225 mcg IV q 2 wks, last 7/21, due 8/04 Last OP HD 7/21, post wt 73.1kg     Assessment/ Plan: SBO: sp NG tube placement, per gen surg/ pmd.  Hypotension: acute on chronic, usual BP's at OP HD are in the 90s-100s. BP's here 70s-90s. Not real symptomatic. Midodrine  on hold. Use IV alb w/ HD prn.  ESRD: on HD MWF. Full OP HD Monday. HD today.  Volume: 2-3 kg up by wts, mild pretib edema, no resp issues, UF as tol w/ HD Anemia of esrd: Hb 8.5- 10.5 here. Follow.  L percutaneous nephrostomy tube: exit site is clean, not painful         Myer Fret MD  [facility] 01/27/2024, 1:31 PM  Recent Labs  Lab 01/25/24 1854 01/26/24 0944 01/27/24 0435 01/27/24 1013  HGB 10.2* 8.8* 8.8*  --   ALBUMIN  4.4  --   --  3.1*  CALCIUM  9.8  --   --  8.7*  CREATININE 6.02* 6.30*  --  7.50*  K 4.1  --   --  5.4*   No results for input(s): IRON, TIBC, FERRITIN in the last 168 hours. Inpatient medications:  calcitRIOL   0.5 mcg Per Tube Q M,W,F-HD   Chlorhexidine  Gluconate Cloth  6 each Topical Q0600   dorzolamide   1 drop Right Eye QHS   fluticasone  furoate-vilanterol  1 puff Inhalation Daily   hydrocortisone  sod succinate  (SOLU-CORTEF ) inj  100 mg Intravenous Q8H   multivitamin  1 tablet Per Tube Daily   pantoprazole  (PROTONIX ) IV  40 mg Intravenous Q24H   sertraline   25 mg Per Tube QHS   sevelamer  carbonate  1,600 mg Per Tube TID WC   timolol   1 drop Both Eyes BID    sodium chloride  Stopped (01/27/24 0717)   sodium chloride      albumin  human     heparin  1,200 Units/hr (01/27/24 1200)   norepinephrine  (LEVOPHED ) Adult infusion 3 mcg/min (01/27/24 1200)   Place/Maintain arterial line **AND** sodium chloride , albumin  human, HYDROmorphone  (DILAUDID ) injection, ipratropium-albuterol 

## 2024-01-27 NOTE — Telephone Encounter (Signed)
 Form completed and faxed back

## 2024-01-27 NOTE — Plan of Care (Addendum)
 Pt passed NGT clamp trial, CL diet given then 2 hrs after drinking, co abd pain and some nausea. NGT placed on LIWS, GI team notified and zofran  given. Bowel sounds active, from ileostomy. JP drain and nephrostomy tube had no output. Weaned to RA.  Problem: Education: Goal: Knowledge of General Education information will improve Description: Including pain rating scale, medication(s)/side effects and non-pharmacologic comfort measures Outcome: Progressing   Problem: Health Behavior/Discharge Planning: Goal: Ability to manage health-related needs will improve Outcome: Progressing   Problem: Clinical Measurements: Goal: Ability to maintain clinical measurements within normal limits will improve Outcome: Progressing Goal: Will remain free from infection Outcome: Progressing Goal: Diagnostic test results will improve Outcome: Progressing Goal: Respiratory complications will improve Outcome: Progressing Goal: Cardiovascular complication will be avoided Outcome: Progressing   Problem: Activity: Goal: Risk for activity intolerance will decrease Outcome: Progressing   Problem: Coping: Goal: Level of anxiety will decrease Outcome: Progressing   Problem: Elimination: Goal: Will not experience complications related to bowel motility Outcome: Progressing Goal: Will not experience complications related to urinary retention Outcome: Progressing   Problem: Pain Managment: Goal: General experience of comfort will improve and/or be controlled Outcome: Progressing   Problem: Safety: Goal: Ability to remain free from injury will improve Outcome: Progressing   Problem: Skin Integrity: Goal: Risk for impaired skin integrity will decrease Outcome: Progressing

## 2024-01-27 NOTE — Progress Notes (Signed)
 PHARMACY - ANTICOAGULATION CONSULT NOTE  Pharmacy Consult for heparin  Indication: atrial fibrillation  Labs: Recent Labs    01/25/24 1854 01/26/24 0944 01/26/24 1926 01/26/24 2116 01/27/24 0024  HGB 10.2* 8.8*  --   --   --   HCT 33.2* 29.1*  --   --   --   PLT 233 156  --   --   --   APTT  --   --   --   --  58*  LABPROT 16.9*  --   --   --   --   INR 1.3*  --   --   --   --   HEPARINUNFRC  --   --   --   --  0.90*  CREATININE 6.02* 6.30*  --   --   --   TROPONINIHS  --   --  24* 23*  --    Assessment: 88yo male subtherapeutic on heparin  with initial dosing while DOAC on hold; no infusion issues or signs of bleeding per RN.  Goal of Therapy:  aPTT 66-102 seconds   Plan:  Increase heparin  infusion by 2 units/kg/hr to 1200 units/hr. Check PTT in 8 hours.   Marvetta Dauphin, PharmD, BCPS 01/27/2024 1:20 AM

## 2024-01-27 NOTE — TOC Initial Note (Signed)
 Transition of Care [facility]) - Initial/Assessment Note    Patient Details  Name: Joshua Riley MRN: [id number] Date of Birth: March 13, 1936  Transition of [facility]) CM/SW Contact:    Lauraine FORBES Saa, LCSW Phone Number: 01/27/2024, 11:27 AM  Clinical Narrative:                  11:27 AM CSW introduced self and role to patient. Patient confirmed he resides at home alone and would provide transportation for self upon discharge. CSW informed patient of alternation discharge transportation (taxi voucher) and of how to obtain (follow up with bedside RN at discharge). Patient expressed understanding of the information. Patient confirmed SNF history with [facility]. Patient confirmed HH history (per chart review, with [facility] and [facility]). Patient confirmed DME (cane, wheelchair, walker, shower chair) history. Patient declined SDOH (social connections) resources. Per chart review, patient has a PCP and insurance. Patient's preferred pharmacy's are [name] and [facility] [phone]. Patient has LTACH history with [facility].   Expected Discharge Plan: Home/Self Care Barriers to Discharge: Continued Medical Work up   Patient Goals and CMS Choice Patient states their goals for this hospitalization and ongoing recovery are:: to return home          Expected Discharge Plan and Services       Living arrangements for the past 2 months: Apartment                                      Prior Living Arrangements/Services Living arrangements for the past 2 months: Apartment Lives with:: Self Patient language and need for interpreter reviewed:: Yes Do you feel safe going back to the place where you live?: Yes      Need for Family Participation in Patient Care: No (Comment) Care giver support system in place?: No (comment) Current home services: DME Criminal Activity/Legal Involvement Pertinent to Current Situation/Hospitalization: No - Comment as  needed  Activities of Daily Living   ADL Screening (condition at time of admission) Independently performs ADLs?: Yes (appropriate for developmental age) Is the patient deaf or have difficulty hearing?: Yes Does the patient have difficulty seeing, even when wearing glasses/contacts?: No Does the patient have difficulty concentrating, remembering, or making decisions?: No  Permission Sought/Granted Permission sought to share information with : Family Supports Permission granted to share information with : No (Contact information on chart)  Share Information with NAME: [name] Brooke     Permission granted to share info w Relationship: Daughter  Permission granted to share info w Contact Information: [phone]  Emotional Assessment Appearance:: Appears stated age Attitude/Demeanor/Rapport: Engaged Affect (typically observed): Accepting, Adaptable, Stable, Appropriate, Calm, Pleasant Orientation: : Oriented to Self, Oriented to Place, Oriented to  Time, Oriented to Situation Alcohol / Substance Use: Not Applicable Psych Involvement: No (comment)  Admission diagnosis:  Pericardial effusion [I31.39] Small bowel obstruction (HCC) [K56.609] SBO (small bowel obstruction) (HCC) [K56.609] ESRD on hemodialysis (HCC) [N18.6, Z99.2] Sepsis without acute organ dysfunction, due to unspecified organism [facility]) [A41.9] Hypotension [I95.9] Patient Active Problem List   Diagnosis Date Noted   Septic shock (HCC) 01/26/2024   ESRD on hemodialysis (HCC) 01/26/2024   Small bowel obstruction (HCC) 01/25/2024   Cerebrovascular disease 12/02/2023   Syncope 11/28/2023   Hypotension 11/28/2023   Seizure like activity 11/28/2023   Acute pericarditis 11/11/2023   Sacral osteomyelitis (HCC) 11/11/2023   Chest pain 11/09/2023   Abscess  of abdominal cavity (HCC) 07/17/2023   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   Anastomotic leak of intestine 07/14/2023   History of COPD 07/08/2023   History of anemia due to  chronic kidney disease 07/08/2023   Abscess of groin, left 07/07/2023   Macrocytic anemia 04/08/2023   Depression 04/08/2023   Abnormal bladder CT 03/23/2023   Infection due to ESBL-producing Escherichia coli 03/23/2023   DOE (dyspnea on exertion) 07/22/2022   Coag negative Staphylococcus bacteremia 06/06/2022   History of sepsis 06/02/2022   Ulcerative colitis (HCC) 06/02/2022   End-stage renal disease on hemodialysis (HCC) 05/06/2022   SBO (small bowel obstruction) (HCC) 11/18/2021   Glaucoma 11/18/2021   Paroxysmal atrial fibrillation (HCC) 11/18/2021   Orthostatic hypotension 10/22/2021   Idiopathic neuropathy 10/22/2021   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   Nephrostomy present (HCC) 10/22/2021   PCP:  Katrinka Garnette KIDD, MD Pharmacy:   [phone] [address] [place], [place] - [address] [place] [address] Phone: [phone] Fax: [phone]  [name] [address] [place] [address] Phone: [phone] Fax: [phone]     Social Drivers of Health (SDOH) Social History: SDOH Screenings   Food Insecurity: No Food Insecurity (01/27/2024)  Housing: Unknown (01/27/2024)  Transportation Needs: No Transportation Needs (01/27/2024)  Utilities: Not At Risk (01/27/2024)  Alcohol Screen: Low Risk  (10/26/2023)  Depression (PHQ2-9): Low Risk  (12/15/2023)  Financial Resource Strain: Low Risk  (10/26/2023)  Physical Activity: Insufficiently Active (10/26/2023)  Social Connections: Socially Isolated (01/27/2024)  Stress: Stress Concern Present (10/26/2023)  Tobacco Use: Medium Risk (01/25/2024)   SDOH Interventions: Social Connections Interventions: Patient Declined   Readmission Risk Interventions    12/03/2023   11:30 AM 11/10/2023    3:46 PM 07/21/2023   12:26 PM  Readmission Risk Prevention Plan  Transportation Screening Complete Complete Complete  PCP or Specialist Appt within 5-7 Days  Complete   Home  Care Screening  Complete   Medication Review (RN CM)  Complete   HRI or Home Care Consult   Complete  Social Work Consult for Recovery Care Planning/Counseling   Complete  Palliative Care Screening   Not Applicable  Medication Review [employer])   Complete  SW Recovery Care/Counseling Consult Complete    Palliative Care Screening Complete    Skilled Nursing Facility Complete

## 2024-01-27 NOTE — Progress Notes (Signed)
 Rounding Note    Patient Name: Joshua Riley Date of Encounter: 01/27/2024  [facility] Cardiologist: Lynwood Schilling, MD   Subjective   Blood pressures improved overnight, currently on low dose levophed . Lactate has normalized. He denies any pain, asking when he can go home. Denies chest pain, shortness of breath.  Inpatient Medications    Scheduled Meds:  calcitRIOL   0.5 mcg Per Tube Q M,W,F-HD   Chlorhexidine  Gluconate Cloth  6 each Topical Q0600   dorzolamide   1 drop Right Eye QHS   fluticasone  furoate-vilanterol  1 puff Inhalation Daily   hydrocortisone  sod succinate (SOLU-CORTEF ) inj  100 mg Intravenous Q8H   multivitamin  1 tablet Per Tube Daily   pantoprazole  (PROTONIX ) IV  40 mg Intravenous Q24H   sertraline   25 mg Per Tube QHS   sevelamer  carbonate  1,600 mg Per Tube TID WC   timolol   1 drop Both Eyes BID   Continuous Infusions:  sodium chloride  Stopped (01/27/24 0717)   sodium chloride      albumin  human     heparin  1,200 Units/hr (01/27/24 1300)   norepinephrine  (LEVOPHED ) Adult infusion 3 mcg/min (01/27/24 1300)   PRN Meds: Place/Maintain arterial line **AND** sodium chloride , albumin  human, HYDROmorphone  (DILAUDID ) injection, ipratropium-albuterol    Vital Signs    Vitals:   01/27/24 1300 01/27/24 1315 01/27/24 1330 01/27/24 1345  BP: (!) 85/58 (!) 88/58 (!) 93/54 (!) 90/54  Pulse: 80 75 75 81  Resp: 15 19 14 14   Temp:      TempSrc:      SpO2: (!) 85% (!) 85% 93% 91%  Weight:      Height:        Intake/Output Summary (Last 24 hours) at 01/27/2024 1407 Last data filed at 01/27/2024 1300 Gross per 24 hour  Intake 2228.08 ml  Output 950 ml  Net 1278.08 ml      01/26/2024    7:03 AM 01/25/2024    6:49 PM 01/21/2024    1:14 PM  Last 3 Weights  Weight (lbs) 163 lb 9.3 oz 160 lb 158 lb 3.2 oz  Weight (kg) 74.2 kg 72.576 kg 71.759 kg      Telemetry    Intermittent SR with PACs, long PR; some episodes difficult to interpret, may be  afib - Personally Reviewed  Physical Exam   GEN: No acute distress.  NG tube in place Neck: No JVD Cardiac: largely RRR, no rubs, or gallops. 1/6 systolic murmur Respiratory: Clear to auscultation bilaterally. GI: Soft, nontender, non-distended  MS: Trivial bilateral LE edema; No deformity. Neuro:  Nonfocal  Psych: Normal affect   New pertinent results (labs, ECG, imaging, cardiac studies)    Echo images from 7/22 personally reviewed, see below CT images from 7/21 personally reviewed, see below  Assessment & Plan    Pericardial effusion: images personally reviewed. I agree with Dr. Debera that there is no tamponade and no indication for pericardiocentesis. Effusion appears best on subcostal images; estimated at 1.4 cm adjacent to LV and 1.5 cm adjacent to RV in this view. No RA/RV collapse, normal RVSP even with elevated RAP of 15 mmHg. CT images also reviewed, hounsfeld units of effusion are somewhat nonspecific, higher than would be expected for typical transudative effusion, but there does not appear to be mixed material/heterogeneity in the space. Appears typical on echo images. Overall, would repeat limited echo in 2-3 days (if not otherwise imaged by CT as part of SBO management). He denies chest pain but does have  a history of pericarditis.   Elevated troponin, minimal: agree with Dr. Madalyn assessment that this is most likely demand ischemia, especially in light of recent cath without significant CAD. No plan for ischemic evaluation this admission. Hs TnI 24 > 23 here. Hs TnT was 255 initially in the ER, lower than historical (has been 428, 374 in the last two months)  Acute on chronic hypotension: he is chronically on midodrine  and florinef  as an outpatient, likely exacerbated by inability to take oral medications for his hypotension due to SBO and is now NPO. Primary team started stress dose steroids and antibiotics for possible sepsis  Atrial fibrillation, persistent:  transitioned to heparin  drip from home apixaban  pending possible need for intervention on SBO. On telemetry, many of the beats appear to be sinus with long PR, though other episodes appear more consistent with afib  ESRD on HD Ulcerative colitis s/p colostomy Multiple recent admissions, including sepsis/groin abscess, SBO, syncope -multiple significant comorbidities affecting his overall clinical status    Signed, Shelda Bruckner, MD  01/27/2024, 2:07 PM

## 2024-01-27 NOTE — Progress Notes (Addendum)
 Subjective: CC: Events noted yesterday. Patient with low BP yesterday refractory to IVF/albumin . On midodrine  and florinef  at baseline for chronic hypotension but unable to take while NPO with NGT to LIWS. Lactic wnl.  Seen with RN.  He reports pain and nausea have resolved. No PRN pain or anti-nausea medication today. NGT with 800cc/24 hours but no output since coming to ICU. He had 150cc out from his ileostomy this morning.   He is currently afebrile without tachycardia. Last BP 87/73 on levo at 2mcg/min. Lactic wnl on last check 7/22. WBC wnl.   Objective: Vital signs in last 24 hours: Temp:  [97.3 F (36.3 C)-97.8 F (36.6 C)] 97.8 F (36.6 C) (07/23 0714) Pulse Rate:  [65-88] 77 (07/23 0815) Resp:  [8-25] 14 (07/23 0815) BP: (67-105)/(48-88) 87/73 (07/23 0815) SpO2:  [86 %-100 %] 100 % (07/23 0815) Last BM Date : 01/27/24  Intake/Output from previous day: 07/22 0701 - 07/23 0700 In: 1900.2 [I.V.:337.7; IV Piggyback:1562.5] Out: 825 [Emesis/NG output:800; Drains:25] Intake/Output this shift: Total I/O In: 22.4 [I.V.:22.4] Out: -   PE: Gen:  Alert, NAD, pleasant HEENT: NGT in place on LIWS with no output in cannister Abd: Soft, improved mild distension, NT, ileostomy bag with scant air and liquid in bag. JP drain SS - 25cc/24 hours.   Lab Results:  Recent Labs    01/26/24 0944 01/27/24 0435  WBC 8.6 7.5  HGB 8.8* 8.8*  HCT 29.1* 29.4*  PLT 156 145*   BMET Recent Labs    01/25/24 1854 01/26/24 0944  NA 142  --   K 4.1  --   CL 98  --   CO2 26  --   GLUCOSE 154*  --   BUN 33*  --   CREATININE 6.02* 6.30*  CALCIUM  9.8  --    PT/INR Recent Labs    01/25/24 1854  LABPROT 16.9*  INR 1.3*   CMP     Component Value Date/Time   NA 142 01/25/2024 1854   NA 138 04/10/2023 1541   K 4.1 01/25/2024 1854   CL 98 01/25/2024 1854   CO2 26 01/25/2024 1854   GLUCOSE 154 (H) 01/25/2024 1854   BUN 33 (H) 01/25/2024 1854   BUN 19 04/10/2023 1541    CREATININE 6.30 (H) 01/26/2024 0944   CALCIUM  9.8 01/25/2024 1854   PROT 7.9 01/25/2024 1854   ALBUMIN  4.4 01/25/2024 1854   AST 28 01/25/2024 1854   ALT 27 01/25/2024 1854   ALKPHOS 144 (H) 01/25/2024 1854   BILITOT 0.7 01/25/2024 1854   GFRNONAA 8 (L) 01/26/2024 0944   Lipase     Component Value Date/Time   LIPASE 49 09/06/2023 1808    Studies/Results: DG Abd Portable 1V-Small Bowel Obstruction Protocol-initial, 8 hr delay Result Date: 01/26/2024 CLINICAL DATA:  8 hour delay bowel obstruction EXAM: PORTABLE ABDOMEN - 1 VIEW COMPARISON:  01/26/2024, CT 01/25/2024 FINDINGS: Enteric tube tip overlies the gastroduodenal region. Enteral contrast within the stomach and small bowel. Dilute contrast present within dilated small bowel loops measuring up to 5.1 cm, down to the pelvis. No contrast visible in the vicinity of the right abdominal ostomy. Patient has history of proctocolectomy. Percutaneous nephrostomy tube in the left abdomen. Drainage catheter in the pelvis. Radial lucent reservoir in the right pelvis for penile implant. IMPRESSION: Enteral contrast within the stomach with dilute contrast within dilated proximal to mid small bowel to the level of the pelvis. No enteral contrast visible in the  region of the patient's right abdominal ileostomy. Electronically Signed   By: Luke Bun M.D.   On: 01/26/2024 22:19   DG Abd Portable 1V-Small Bowel Protocol-Position Verification Result Date: 01/26/2024 CLINICAL DATA:  Nasogastric tube placement. EXAM: PORTABLE ABDOMEN - 1 VIEW COMPARISON:  January 25, 2024. FINDINGS: Distal tip of nasogastric tube is seen in expected position of distal stomach. Left-sided nephrostomy is again noted. IMPRESSION: Nasogastric tube tip seen in expected position of distal stomach. Electronically Signed   By: Lynwood Landy Raddle M.D.   On: 01/26/2024 13:36   ECHOCARDIOGRAM COMPLETE Result Date: 01/26/2024    ECHOCARDIOGRAM REPORT   Patient Name:   Joshua Riley Date of  Exam: 01/26/2024 Medical Rec #:  [id number]       Height:       68.0 in Accession #:    [id number]      Weight:       163.6 lb Date of Birth:  [birth year]       BSA:          1.876 m Patient Age:    87 years        BP:           98/67 mmHg Patient Gender: M               HR:           86 bpm. Exam Location:  Inpatient Procedure: 2D Echo, Color Doppler and Cardiac Doppler (Both Spectral and Color            Flow Doppler were utilized during procedure). Indications:    I31.3 Pericardial effusion  History:        Patient has prior history of Echocardiogram examinations, most                 recent 11/10/2023. COPD, Arrythmias:Atrial Fibrillation; Risk                 Factors:Sleep Apnea.  Sonographer:    Damien Senior RDCS Referring Phys: [phone] SUBRINA SUNDIL IMPRESSIONS  1. Left ventricular ejection fraction, by estimation, is 60 to 65%. The left ventricle has normal function. The left ventricle has no regional wall motion abnormalities. There is mild asymmetric left ventricular hypertrophy of the septal segment. Left ventricular diastolic parameters are indeterminate.  2. Right ventricular systolic function is normal. The right ventricular size is normal. There is normal pulmonary artery systolic pressure. The estimated right ventricular systolic pressure is 33.0 mmHg.  3. Moderate pericardial effusion. The pericardial effusion is circumferential. No evidence of right ventricular collapse or right atrial collapse.  4. The mitral valve is degenerative. Trivial mitral valve regurgitation.  5. The aortic valve has an indeterminant number of cusps. Aortic valve regurgitation is trivial. Aortic valve sclerosis is present, with no evidence of aortic valve stenosis.  6. The inferior vena cava is dilated in size with <50% respiratory variability, suggesting right atrial pressure of 15 mmHg. Comparison(s): Prior images reviewed side by side. New pericardial effusion. FINDINGS  Left Ventricle: Left ventricular ejection fraction,  by estimation, is 60 to 65%. The left ventricle has normal function. The left ventricle has no regional wall motion abnormalities. The left ventricular internal cavity size was normal in size. There is  mild asymmetric left ventricular hypertrophy of the septal segment. Left ventricular diastolic function could not be evaluated due to atrial fibrillation. Left ventricular diastolic parameters are indeterminate. Right Ventricle: The right ventricular size is normal. No increase in right ventricular wall thickness.  Right ventricular systolic function is normal. There is normal pulmonary artery systolic pressure. The tricuspid regurgitant velocity is 2.12 m/s, and  with an assumed right atrial pressure of 15 mmHg, the estimated right ventricular systolic pressure is 33.0 mmHg. Left Atrium: Left atrial size was normal in size. Right Atrium: Right atrial size was normal in size. Pericardium: A moderately sized pericardial effusion is present. The pericardial effusion is circumferential. The pericardial effusion appears to contain fibrous material. Mitral Valve: The mitral valve is degenerative in appearance. Trivial mitral valve regurgitation. Tricuspid Valve: The tricuspid valve is normal in structure. Tricuspid valve regurgitation is mild. Aortic Valve: The aortic valve has an indeterminant number of cusps. Aortic valve regurgitation is trivial. Aortic valve sclerosis is present, with no evidence of aortic valve stenosis. Pulmonic Valve: The pulmonic valve was normal in structure. Pulmonic valve regurgitation is not visualized. Aorta: The aortic root is normal in size and structure and the ascending aorta was not well visualized. Venous: The inferior vena cava is dilated in size with less than 50% respiratory variability, suggesting right atrial pressure of 15 mmHg. IAS/Shunts: No atrial level shunt detected by color flow Doppler.  LEFT VENTRICLE PLAX 2D LVIDd:         3.10 cm LVIDs:         2.20 cm LV PW:         0.90  cm LV IVS:        1.10 cm LVOT diam:     1.90 cm LV SV:         42 LV SV Index:   22 LVOT Area:     2.84 cm  RIGHT VENTRICLE RV S prime:     9.64 cm/s TAPSE (M-mode): 1.7 cm LEFT ATRIUM             Index        RIGHT ATRIUM           Index LA diam:        3.70 cm 1.97 cm/m   RA Area:     21.30 cm LA Vol (A2C):   52.1 ml 27.77 ml/m  RA Volume:   57.80 ml  30.80 ml/m LA Vol (A4C):   61.2 ml 32.62 ml/m LA Biplane Vol: 58.9 ml 31.39 ml/m  AORTIC VALVE LVOT Vmax:   75.00 cm/s LVOT Vmean:  57.100 cm/s LVOT VTI:    0.147 m  AORTA Ao Root diam: 3.70 cm TRICUSPID VALVE TR Peak grad:   18.0 mmHg TR Vmax:        212.00 cm/s  SHUNTS Systemic VTI:  0.15 m Systemic Diam: 1.90 cm Stanly Leavens MD Electronically signed by Stanly Leavens MD Signature Date/Time: 01/26/2024/12:27:36 PM    Final    DG Abdomen 1 View Result Date: 01/25/2024 CLINICAL DATA:  NG tube placement. EXAM: ABDOMEN - 1 VIEW COMPARISON:  September 07, 2023 FINDINGS: A nasogastric tube is seen with its distal tip overlying the expected region of the gastric antrum. The bowel gas pattern is normal. A left-sided percutaneous of proximally tube is in place. No radio-opaque calculi or other significant radiographic abnormality are seen. IMPRESSION: Nasogastric tube positioning, as described above. Electronically Signed   By: Suzen Dials M.D.   On: 01/25/2024 23:06   CT ABDOMEN PELVIS W CONTRAST Result Date: 01/25/2024 CLINICAL DATA:  Pain nausea reduced ostomy output EXAM: CT ABDOMEN AND PELVIS WITH CONTRAST TECHNIQUE: Multidetector CT imaging of the abdomen and pelvis was performed using the standard protocol following bolus  administration of intravenous contrast. RADIATION DOSE REDUCTION: This exam was performed according to the departmental dose-optimization program which includes automated exposure control, adjustment of the mA and/or kV according to patient size and/or use of iterative reconstruction technique. CONTRAST:  OMNIPAQUE   IOHEXOL  300 MG/ML  SOLN COMPARISON:  CT 12/30/2023, 12/18/2023 FINDINGS: Lower chest: Lung bases demonstrate atelectasis or scarring at the bases. Cardiomegaly. Moderate pericardial effusion, increased compared to prior, measures up to 19 mm maximum thickness along the right cardiac border, and exhibits mild diffuse rim enhancement. Slight increased density values within the fluid. Hepatobiliary: Gallstone. No focal hepatic abnormality or biliary dilatation Pancreas: Unremarkable. No pancreatic ductal dilatation or surrounding inflammatory changes. Spleen: Normal in size without focal abnormality. Adrenals/Urinary Tract: Adrenal glands are normal. Atrophic kidneys without hydronephrosis. Left percutaneous nephrostomy catheter with decreased collecting system. Renal cysts for which no imaging follow-up is recommended. Thick-walled collapsed appearance of the urinary bladder with mild perivesical stranding. Stomach/Bowel: Stomach is moderately distended with fluid. Interim development of multiple dilated fluid-filled loops of proximal to mid small bowel, measuring up to 4.4 cm. Fecalized small bowel in the pelvis. Adhesed appearance of small-bowel loops to the anterior abdominal wall below the level of umbilicus. Decompressed small bowel in the region and remaining distal small bowel is decompressed. Patient is status post colectomy with right abdominal ileostomy. Small parastomal hernia contains fat and a knuckle of small bowel. Vascular/Lymphatic: Aortic atherosclerosis. No enlarged abdominal or pelvic lymph nodes. Reproductive: Slightly enlarged prostate Other: No free air. Left trans gluteal percutaneous drainage catheter since the prior exam with pigtail in the presacral region at the site of prior fluid collection. Residual soft tissue thickening in the region with small residual fluid inferior to the pigtail measuring about 14 x 16 mm on series 2, image 71. Musculoskeletal: No acute osseous abnormality. 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 Dialysis graft in the left thigh and groin with similar small fluid collection in the left groin measuring 19 mm, previously 19 mm. Small fluid collection measuring 14 mm adjacent to the lateral/arterial limb of the graft in the thigh, stable since 2024. IMPRESSION: 1. Status post proctocolectomy with right abdominal ileostomy. Small parastomal hernia contains fat and a knuckle of small bowel. Interim development of moderate to marked fluid distension of the stomach with multiple dilated fluid-filled loops of proximal to mid small bowel and fecalized bowel in the pelvis consistent with small bowel obstruction. Transition point appears to be within the low anterior pelvis where there is adhesed appearance of multiple small bowel loops. 2. Left trans gluteal percutaneous drainage catheter with pigtail in the presacral region at the site of prior fluid collection. Residual soft tissue thickening in the region with small residual fluid collection inferior to the pigtail measuring about 14 x 16 mm. 3. Moderate pericardial effusion, increased compared to prior, exhibits mild diffuse rim enhancement. Slight increased density values within the fluid. Correlate for infection/pericarditis. 4. Gallstone. 5. Thick-walled collapsed appearance of the urinary bladder with mild perivesical stranding. 6. Left percutaneous nephrostomy catheter with no hydronephrosis. Atrophic native kidneys. Aortic Atherosclerosis (ICD10-I70.0). Electronically Signed   By: Luke Bun M.D.   On: 01/25/2024 20:40   DG Chest Port 1 View Result Date: 01/25/2024 CLINICAL DATA:  Concern for sepsis EXAM: PORTABLE CHEST 1 VIEW COMPARISON:  None Available. FINDINGS: 11/27/2023 stable enlarged cardiac silhouette. No effusion, infiltrate, pneumothorax. Mild scarring at LEFT lung base. RIGHT subclavian stent. IMPRESSION: 1. No acute cardiopulmonary process. 2. Cardiomegaly. Electronically Signed  By: Jackquline Boxer M.D.   On: 01/25/2024 19:36    Anti-infectives: Anti-infectives (From admission, onward)    Start     Dose/Rate Route Frequency Ordered Stop   01/26/24 2200  metroNIDAZOLE  (FLAGYL ) IVPB 500 mg        500 mg 100 mL/hr over 60 Minutes Intravenous Every 12 hours 01/26/24 2143     01/26/24 2015  vancomycin  (VANCOREADY) IVPB 500 mg/100 mL        500 mg 100 mL/hr over 60 Minutes Intravenous  Once 01/26/24 1926 01/27/24 0000   01/26/24 2015  aztreonam  (AZACTAM ) 2 g in sodium chloride  0.9 % 100 mL IVPB        2 g 200 mL/hr over 30 Minutes Intravenous Every 24 hours 01/26/24 1926     01/26/24 1926  vancomycin  variable dose per unstable renal function (pharmacist dosing)         Does not apply See admin instructions 01/26/24 1926     01/25/24 2000  aztreonam  (AZACTAM ) 2 g in sodium chloride  0.9 % 100 mL IVPB        2 g 200 mL/hr over 30 Minutes Intravenous  Once 01/25/24 1958 01/25/24 2101   01/25/24 2000  metroNIDAZOLE  (FLAGYL ) IVPB 500 mg        500 mg 100 mL/hr over 60 Minutes Intravenous  Once 01/25/24 1958 01/25/24 2235   01/25/24 2000  vancomycin  (VANCOCIN ) IVPB 1000 mg/200 mL premix        1,000 mg 200 mL/hr over 60 Minutes Intravenous  Once 01/25/24 1958 01/26/24 0019        Assessment/Plan SBO - CT scan showed a small bowel obstruction with a transition in the pelvis. 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 He has a history of UC s/p TAC and ileostomy creation. He has had multiple SBO's in the past - No current indication for emergency surgery. I do not think his acute on chronic low BP is coming from anything emergent in his abdomen. His lactic acid was wnl on last check, afebrile, wbc wnl, pain resolved, NT on exam and has started to have some bowel function.  - Received gastrografin  7/22. 8 hours delay film still with  dilated small bowel. Would not expect to see contrast make it to his colon as he has had a prior TAC. He has had some bowel function per ileostomy this AM per RN report. Will repeat xray this AM.  - Cont NGT for decompression and keep NPO till xray results. May be able to do clamping trial later today.  - Hopefully patient will improve with conservative management. If patient fails to improve with conservative management, they may require exploratory surgery during admission - We will follow with you.  Recurrent presacral fluid collection with drain in place - currently SS. CT with Left trans gluteal percutaneous drainage catheter with pigtail inthe presacral region at the site of prior fluid collection. Residual soft tissue thickening in the region with small residual fluid collection inferior to the pigtail measuring about 14 x 16 mm. IR has been following this as an outpatient.   FEN - NPO, NGT to LIWS, IVF per primary. Electrolytes per neprhology VTE - SCDs, hold Eliquis , heparin  gtt. LE DVT US  pending.  ID - Vanc, Aztreonam  and Flagyl  per primary    - Per primary -  Acute  on Chronic hypotension - on Midodrine  and florinef  at baseline. Currently on low dose levo.  Hx pericarditis with evidence of moderate pericardial effusion on imaging this admission and elevated TN's - cardiology following  ESRD on HD MWF dCHF A-fib on Eliquis  - please hold eliquis ,  Hx ulcerative colitis s/p TAC and ileostomy  CVA COPD chronic sacral osteomyelitis (recently completed ertapenem  and vanc) Chronic hypotension on Midodrine  and florinef    I reviewed nursing notes, hospitalist notes, last 24 h vitals and pain scores, last 48 h intake and output, last 24 h labs and trends, and last 24 h imaging results.   LOS: 1 day    Ozell CHRISTELLA Shaper, [facility] 01/27/2024, 8:30 AM Please see Amion for pager number during day hours 7:00am-4:30pm

## 2024-01-27 NOTE — Progress Notes (Signed)
 PHARMACY - ANTICOAGULATION CONSULT NOTE  Pharmacy Consult for heparin  Indication: AF  Allergies  Allergen Reactions   Baclofen Other (See Comments)    Altered mental status, after accidental overdose     Cephalosporins Rash   Ciprofloxacin Itching and Rash    Patient Measurements: Height: 5' 8 (172.7 cm) Weight: 74.2 kg (163 lb 9.3 oz) IBW/kg (Calculated) : 68.4 HEPARIN  DW (KG): 74.2  Vital Signs: Temp: 97.7 F (36.5 C) (07/23 1123) Temp Source: Oral (07/23 1123) BP: 82/56 (07/23 1115) Pulse Rate: 68 (07/23 1115)  Labs: Recent Labs    01/25/24 1854 01/26/24 0944 01/26/24 1926 01/26/24 2116 01/27/24 0024 01/27/24 0435 01/27/24 1013  HGB 10.2* 8.8*  --   --   --  8.8*  --   HCT 33.2* 29.1*  --   --   --  29.4*  --   PLT 233 156  --   --   --  145*  --   APTT  --   --   --   --  58*  --  78*  LABPROT 16.9*  --   --   --   --   --   --   INR 1.3*  --   --   --   --   --   --   HEPARINUNFRC  --   --   --   --  0.90*  --   --   CREATININE 6.02* 6.30*  --   --   --   --   --   TROPONINIHS  --   --  24* 23*  --   --   --     Estimated Creatinine Clearance: 8.3 mL/min (A) (by C-G formula based on SCr of 6.3 mg/dL (H)).   Medical History: Past Medical History:  Diagnosis Date   A-fib (HCC)    Anemia    Arthritis    Cancer (HCC)    Basal cell   COVID-19    2021   Dysrhythmia    Afib-controlled on eliquis    ESRD (end stage renal disease) (HCC) 10/22/2021   Glaucoma 11/18/2021   History of DVT (deep vein thrombosis)    Hydronephrosis    managed wtih a PCN   Idiopathic neuropathy 10/22/2021   lyrica     Ileostomy in place [facility])    Obstructive uropathy    With chronic left nephrostomy   Old retinal detachment, total or subtotal    Orthostatic hypotension 10/22/2021   Sleep apnea    does not need a machine   Stroke (HCC)    Ulcerative colitis (HCC)    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    Medications:  Medications Prior to  Admission  Medication Sig Dispense Refill Last Dose/Taking   albuterol  (PROVENTIL ) (2.5 MG/3ML) 0.083% nebulizer solution Take 2.5 mg by nebulization every 6 (six) hours as needed for wheezing or shortness of breath.      albuterol  (VENTOLIN  HFA) 108 (90 Base) MCG/ACT inhaler Inhale 1-2 puffs into the lungs every 6 (six) hours as needed for wheezing or shortness of breath.      atorvastatin  (LIPITOR) 20 MG tablet Take 1 tablet (20 mg total) by mouth daily. 90 tablet 0    budesonide  (PULMICORT ) 0.5 MG/2ML nebulizer solution Take 0.5 mg by nebulization 2 (two) times daily as needed (for respiratory flares).      calcitRIOL  (ROCALTROL ) 0.5 MCG capsule Take 1 capsule (0.5 mcg total) by mouth every Monday, Wednesday,  and Friday with hemodialysis.      Cyanocobalamin (VITAMIN B-12 PO) Take 1 capsule by mouth in the morning and at bedtime.      dorzolamide  (TRUSOPT ) 2 % ophthalmic solution Place 1 drop into the right eye at bedtime.      ELIQUIS  2.5 MG TABS tablet Take 1 tablet (2.5 mg total) by mouth 2 (two) times daily. 60 tablet 11    fludrocortisone  (FLORINEF ) 0.1 MG tablet Take 1 tablet (0.1 mg total) by mouth daily.      folic acid  (FOLVITE ) 1 MG tablet Take 1 tablet (1 mg total) by mouth daily. 30 tablet 0    gabapentin  (NEURONTIN ) 300 MG capsule Take 300 mg by mouth at bedtime.      loperamide  (IMODIUM ) 2 MG capsule Take 1 capsule (2 mg total) by mouth as needed for diarrhea or loose stools. 20 capsule 0    midodrine  (PROAMATINE ) 10 MG tablet Take 2 tablets (20 mg total) by mouth 3 (three) times daily with meals.      mometasone -formoterol  (DULERA ) 200-5 MCG/ACT AERO Inhale 2 puffs into the lungs 2 (two) times daily. 1 each 1    multivitamin (RENA-VIT) TABS tablet Take 1 tablet by mouth daily.      Omega Fatty Acids-Vitamins (OMEGA-3 GUMMIES) CHEW Chew 1 tablet by mouth in the morning and at bedtime.      pantoprazole  (PROTONIX ) 40 MG tablet Take 1 tablet (40 mg total) by mouth 2 (two) times  daily. 60 tablet 1    ramelteon  (ROZEREM ) 8 MG tablet Take 1 tablet (8 mg total) by mouth at bedtime. 30 tablet 5    sertraline  (ZOLOFT ) 50 MG tablet Take 25 mg by mouth at bedtime.      sevelamer  carbonate (RENVELA ) 800 MG tablet Take 1,600 mg by mouth 3 (three) times daily with meals.      Sodium Chloride  Flush (SALINE FLUSH) 0.9 % SOLN Use 5 mLs by Intracatheter route daily as directed. 150 mL 1    timolol  (TIMOPTIC ) 0.5 % ophthalmic solution Place 1 drop into both eyes in the morning and at bedtime.      traMADol  (ULTRAM ) 50 MG tablet Take 1 tablet (50 mg total) by mouth every 8 (eight) hours as needed (for pain). 12 tablet 0    TYLENOL  325 MG tablet Take 325-650 mg by mouth every 6 (six) hours as needed for mild pain (pain score 1-3) or headache.      Scheduled:   calcitRIOL   0.5 mcg Per Tube Q M,W,F-HD   Chlorhexidine  Gluconate Cloth  6 each Topical Q0600   dorzolamide   1 drop Right Eye QHS   fluticasone  furoate-vilanterol  1 puff Inhalation Daily   hydrocortisone  sod succinate (SOLU-CORTEF ) inj  100 mg Intravenous Q8H   multivitamin  1 tablet Per Tube Daily   pantoprazole  (PROTONIX ) IV  40 mg Intravenous Q24H   sertraline   25 mg Per Tube QHS   sevelamer  carbonate  1,600 mg Per Tube TID WC   timolol   1 drop Both Eyes BID    Assessment: 88 yo male with SBO. He has a history of afib on apixaban  at home (timing of last dose unknown, PTA) - Hg stable at 8.8 - aPTT therapeutic at 78 seconds   Goal of Therapy:  Heparin  level 0.3-0.7 units/ml aPTT 66-102 seconds Monitor platelets by anticoagulation protocol: Yes   Plan:  - Continue heparin  drip at current rate 1200 units/hr  - Daily aPTT levels  - Daily heparin  levels until  correlated  - Follow up with signs and symptoms of bleeding and transition to PO anticoagulant as able.  Vermell Mccallum, PharmD Clinical Pharmacist **Pharmacist phone directory can now be found on [email or web address] ([email or web address]).  Listed under [facility].

## 2024-01-28 ENCOUNTER — Inpatient Hospital Stay (HOSPITAL_COMMUNITY)

## 2024-01-28 DIAGNOSIS — N186 End stage renal disease: Secondary | ICD-10-CM | POA: Diagnosis not present

## 2024-01-28 DIAGNOSIS — K56609 Unspecified intestinal obstruction, unspecified as to partial versus complete obstruction: Secondary | ICD-10-CM | POA: Diagnosis not present

## 2024-01-28 DIAGNOSIS — Z992 Dependence on renal dialysis: Secondary | ICD-10-CM | POA: Diagnosis not present

## 2024-01-28 DIAGNOSIS — I959 Hypotension, unspecified: Secondary | ICD-10-CM | POA: Diagnosis not present

## 2024-01-28 LAB — CBC
HCT: 32.3 % — ABNORMAL LOW (ref 39.0–52.0)
Hemoglobin: 9.9 g/dL — ABNORMAL LOW (ref 13.0–17.0)
MCH: 36.4 pg — ABNORMAL HIGH (ref 26.0–34.0)
MCHC: 30.7 g/dL (ref 30.0–36.0)
MCV: 118.8 fL — ABNORMAL HIGH (ref 80.0–100.0)
Platelets: 263 K/uL (ref 150–400)
RBC: 2.72 MIL/uL — ABNORMAL LOW (ref 4.22–5.81)
RDW: 19 % — ABNORMAL HIGH (ref 11.5–15.5)
WBC: 9.8 K/uL (ref 4.0–10.5)
nRBC: 1.2 % — ABNORMAL HIGH (ref 0.0–0.2)

## 2024-01-28 LAB — GLUCOSE, CAPILLARY
Glucose-Capillary: 149 mg/dL — ABNORMAL HIGH (ref 70–99)
Glucose-Capillary: 159 mg/dL — ABNORMAL HIGH (ref 70–99)
Glucose-Capillary: 164 mg/dL — ABNORMAL HIGH (ref 70–99)
Glucose-Capillary: 167 mg/dL — ABNORMAL HIGH (ref 70–99)
Glucose-Capillary: 185 mg/dL — ABNORMAL HIGH (ref 70–99)
Glucose-Capillary: 188 mg/dL — ABNORMAL HIGH (ref 70–99)

## 2024-01-28 LAB — BASIC METABOLIC PANEL WITH GFR
Anion gap: 14 (ref 5–15)
BUN: 69 mg/dL — ABNORMAL HIGH (ref 8–23)
CO2: 21 mmol/L — ABNORMAL LOW (ref 22–32)
Calcium: 8.8 mg/dL — ABNORMAL LOW (ref 8.9–10.3)
Chloride: 99 mmol/L (ref 98–111)
Creatinine, Ser: 8.13 mg/dL — ABNORMAL HIGH (ref 0.61–1.24)
GFR, Estimated: 6 mL/min — ABNORMAL LOW (ref >60)
Glucose, Bld: 192 mg/dL — ABNORMAL HIGH (ref 70–99)
Potassium: 5.4 mmol/L — ABNORMAL HIGH (ref 3.5–5.1)
Sodium: 134 mmol/L — ABNORMAL LOW (ref 135–145)

## 2024-01-28 LAB — POTASSIUM
Potassium: 5.9 mmol/L — ABNORMAL HIGH (ref 3.5–5.1)
Potassium: 6.2 mmol/L — ABNORMAL HIGH (ref 3.5–5.1)

## 2024-01-28 LAB — APTT: aPTT: 75 s — ABNORMAL HIGH (ref 24–36)

## 2024-01-28 LAB — HEPARIN LEVEL (UNFRACTIONATED): Heparin Unfractionated: 0.87 [IU]/mL — ABNORMAL HIGH (ref 0.30–0.70)

## 2024-01-28 MED ORDER — INSULIN ASPART 100 UNIT/ML IJ SOLN
0.0000 [IU] | INTRAMUSCULAR | Status: DC
  Administered 2024-01-28: 2 [IU] via SUBCUTANEOUS
  Administered 2024-01-28 (×2): 3 [IU] via SUBCUTANEOUS
  Administered 2024-01-29 (×2): 2 [IU] via SUBCUTANEOUS
  Administered 2024-01-29: 3 [IU] via SUBCUTANEOUS
  Administered 2024-01-29: 2 [IU] via SUBCUTANEOUS
  Administered 2024-01-29: 3 [IU] via SUBCUTANEOUS
  Administered 2024-01-30 (×2): 2 [IU] via SUBCUTANEOUS
  Administered 2024-01-30 – 2024-01-31 (×3): 3 [IU] via SUBCUTANEOUS
  Administered 2024-01-31: 1 [IU] via SUBCUTANEOUS
  Administered 2024-01-31: 3 [IU] via SUBCUTANEOUS
  Administered 2024-01-31: 5 [IU] via SUBCUTANEOUS
  Administered 2024-01-31 (×2): 2 [IU] via SUBCUTANEOUS
  Administered 2024-02-01: 3 [IU] via SUBCUTANEOUS

## 2024-01-28 MED ORDER — CHLORHEXIDINE GLUCONATE CLOTH 2 % EX PADS: 6.0000 | MEDICATED_PAD | Freq: Every day | CUTANEOUS | Status: DC

## 2024-01-28 MED ORDER — OXYCODONE HCL 5 MG PO TABS
5.0000 mg | ORAL_TABLET | Freq: Four times a day (QID) | ORAL | Status: AC | PRN
  Administered 2024-01-28 – 2024-01-30 (×2): 5 mg
  Filled 2024-01-28 (×2): qty 1

## 2024-01-28 MED ORDER — ACETAMINOPHEN 10 MG/ML IV SOLN
1000.0000 mg | Freq: Four times a day (QID) | INTRAVENOUS | Status: AC
  Administered 2024-01-28 – 2024-01-29 (×4): 1000 mg via INTRAVENOUS
  Filled 2024-01-28 (×5): qty 100

## 2024-01-28 MED ORDER — HYDROCORTISONE SOD SUC (PF) 100 MG IJ SOLR
100.0000 mg | Freq: Three times a day (TID) | INTRAMUSCULAR | Status: DC
  Administered 2024-01-28 – 2024-01-30 (×7): 100 mg via INTRAVENOUS
  Filled 2024-01-28 (×7): qty 2

## 2024-01-28 MED ORDER — ALBUMIN HUMAN 25 % IV SOLN
25.0000 g | INTRAVENOUS | Status: AC | PRN
  Administered 2024-01-29 – 2024-02-02 (×2): 25 g via INTRAVENOUS

## 2024-01-28 MED ORDER — SODIUM ZIRCONIUM CYCLOSILICATE 10 G PO PACK
10.0000 g | PACK | Freq: Once | ORAL | Status: AC
  Administered 2024-01-28: 10 g
  Filled 2024-01-28: qty 1

## 2024-01-28 NOTE — Progress Notes (Signed)
 Pt receives out-pt HD at [facility] [place] on MWF 6:10 am chair time. Will assist as needed.   Randine Mungo Dialysis Navigator [phone]

## 2024-01-28 NOTE — Progress Notes (Signed)
 Brief cardiology note: Limited echo order placed for 7/25 to follow up on effusion. We will follow up tomorrow once results of this study are available.  Shelda Bruckner, MD, PhD, [facility]  [facility]  [place]  Heart & Vascular at [facility] at [facility] [address], [address] [place], [place] [address] [phone]

## 2024-01-28 NOTE — Progress Notes (Signed)
 NAME:  Precious Gilchrest, MRN:  [id number], DOB:  [birth year], LOS: 2 ADMISSION DATE:  01/25/2024, CONSULTATION DATE:   7/22 REFERRING MD:  Dr. Lee, CHIEF COMPLAINT:  hypotension; sbo   History of Present Illness:  Patient is a 88 year old male with pertinent PMH ESRD on HD MWF, dCHF, A-fib on Eliquis , ulcerative colitis w/ ileostomy, CVA, chronic sacral osteomyelitis (recently completed ertapenem  and vanc), orthostatic hypotension presents to [facility] on 7/22 with SBO.   Patient having abd on 7/21 that progressively worsened w/ N/V came to [facility] ED later that night. On arrival patient tachycardic and hypotensive. Patient afebrile and wbc 11.  Cultures obtained, given iv fluids, started on aztreonam  and vanc. LA 2.6 then 1.6. PCT 2.3. CXR no acute findings. Covid/flu/rsv negative. CT abd/pelvis w/ small parastomal hernia contains fat and a knuckle of small bowel; Interim development of moderate to marked fluid distension of the stomach with multiple dilated fluid-filled loops of proximal to mid small bowel and fecalized bowel in the pelvis consistent with small bowel obstruction; moderate pericardial effusion. Surgery consulted. NG tube placed and made npo. Trop 250. Echo lvef 60-65%; moderate pericardial effusion; RA pressure 15. Cards consulted. On 7/22 patient remained hypotensive despite iv fluids. Unable to give home midodrine /florinef  while npo. PCCM consulted.   Pertinent  Medical History          Past Medical History:  Diagnosis Date   A-fib (HCC)     Anemia     Arthritis     Cancer (HCC)      Basal cell   COVID-19      2021   Dysrhythmia      Afib-controlled on eliquis    ESRD (end stage renal disease) (HCC) 10/22/2021   Glaucoma 11/18/2021   History of DVT (deep vein thrombosis)     Hydronephrosis      managed wtih a PCN   Idiopathic neuropathy 10/22/2021    lyrica     Ileostomy in place [facility])     Obstructive uropathy      With chronic left nephrostomy   Old retinal detachment,  total or subtotal     Orthostatic hypotension 10/22/2021   Sleep apnea      does not need a machine   Stroke (HCC)     Ulcerative colitis (HCC)     Ureteral stricture      secondary to injury during surg     Significant Hospital Events: Including procedures, antibiotic start and stop dates in addition to other pertinent events   7/21 admit w/ sbo and hypotension 7/22 remains hypotensive despite fluids; pccm consulted  Interim History / Subjective:  Yesterday advanced to liquid but subsequent ab pain, nauseas, abdominal tension. Back to NPO and suction this morning per surg.  Objective    Blood pressure (!) 86/67, pulse 80, temperature 98 F (36.7 C), temperature source Oral, resp. rate (!) 7, height 5' 8 (1.727 m), weight 74.2 kg, SpO2 98%.        Intake/Output Summary (Last 24 hours) at 01/28/2024 0825 Last data filed at 01/28/2024 0600 Gross per 24 hour  Intake 767.49 ml  Output --  Net 767.49 ml   Filed Weights   01/25/24 1849 01/26/24 0703  Weight: 72.6 kg 74.2 kg   Examination: General:  NAD. Appears more tired than yesterday. HEENT: MM pink/moist; [place] and ng tube in place Neuro: Aox3; perrl CV: s1s2, irregularly irregular rate 70-80s, no m/r/g PULM:  trace rales in the bases; [place] 1L  GI: Ileostomy. Increased distension  from yesterday. Extremities: warm/dry, RLE slightly more swelling and some erythema than LLE; left femoral fistula w/ no drainage/erythema  Resolved problem list   Assessment and Plan   A on C Hypotension without shock History hypotension, adrenal insufficiency Pt takes midodrine  and florinef  out of hospital but unable given SBO. MAPs below 65 at admission despite fluids. No lactic acidosis, no mentation changes, pt feels his current levels are WNL. Warm on exam. Overnight attempted oral medicines, but do not expect much absorption with ongoing partial SBO and pressors are going up. Will restart stress steroids. Holding fluids given ESRD and  refusal of dialysis. - Pressers titration adjusted for BP goals for systolic above 80 - stress steroids - resume po medicines ASAP upon diet advance - monitor mental status   SBO Hx of ulcerative colitis w/ ileostomy Surgery following, thank you. BM per ileostomy have resumed, but low output and attempt to advance yesterday without success. Per discussion with surgery, return to NPO/ NG LIWS. No OR plans today. - NPO and NG to LIWS. - pain management prn. Dilaudid  (sparingly) and IV tylenol .   Chronic osteomyelitis Chronic Presacral fluid collection with cultures + for E Coli ESBL and Klebsiella Percutaneous drain - chronic presacral fluid collection Appreciate ID eval. Chronic colonization not clinically significant here. Abx stopped. - Trend WBCs and fevers   ESRD on HD, he refused yesterday Hyperkalemia L nephrostomy tube Nephro following, thank you. He refused HD yesterday, but unclear why since it was his normal day and he expressly wants to remain on his normal schedule. Will trend K given elevation to 5.4   A-fib on Eliquis  dCHF Heparin  gtt and tele monitoring.   Anemia of chronic disease At baseline.   Moderate pericardial effusion Elevated Troponin: likely demand ischemia Cardiology following, thank you. Currently no indication for pericardiocentesis. Plan a repeat ECHO tomorrow.   RLE swelling Also with some warmth/erythema. Will observe. DVT study negative. On heparin  already.   Hx of CVA -hold statin while npo   Hx of copd Denies baseline O2 requirement. on dulera  -wean [place] for sats 88-92% -breo-ellipta; prn duoneb   Depression -hold home zoloft    Peripheral neuropathy -hold gaba while npo  Best Practice (right click and Reselect all SmartList Selections daily)   Diet/type: NPO DVT prophylaxis systemic heparin  Pressure ulcer(s): pressure ulcer assessment deferred  GI prophylaxis: N/A Lines: N/A Foley:  N/A Code Status:  full code Last date of  multidisciplinary goals of care discussion [7/22 patient updated at bedside]  Labs   CBC: Recent Labs  Lab 01/25/24 1854 01/26/24 0944 01/27/24 0435 01/28/24 0349  WBC 11.0* 8.6 7.5 9.8  NEUTROABS 9.6*  --   --   --   HGB 10.2* 8.8* 8.8* 9.9*  HCT 33.2* 29.1* 29.4* 32.3*  MCV 119.0* 119.8* 121.5* 118.8*  PLT 233 156 145* 263    Basic Metabolic Panel: Recent Labs  Lab 01/25/24 1854 01/26/24 0944 01/26/24 2116 01/27/24 1013 01/28/24 0349  NA 142  --   --  139 134*  K 4.1  --   --  5.4* 5.4*  CL 98  --   --  101 99  CO2 26  --   --  20* 21*  GLUCOSE 154*  --   --  131* 192*  BUN 33*  --   --  54* 69*  CREATININE 6.02* 6.30*  --  7.50* 8.13*  CALCIUM  9.8  --   --  8.7* 8.8*  MG  --   --  1.9 1.9  --    GFR: Estimated Creatinine Clearance: 6.4 mL/min (A) (by C-G formula based on SCr of 8.13 mg/dL (H)). Recent Labs  Lab 01/25/24 1854 01/25/24 1907 01/25/24 2101 01/26/24 0944 01/26/24 1650 01/26/24 1926 01/26/24 2116 01/27/24 0435 01/28/24 0349  PROCALCITON  --   --   --  2.30  --   --   --   --   --   WBC 11.0*  --   --  8.6  --   --   --  7.5 9.8  LATICACIDVEN  --    < > 1.6  --  1.6 1.2 1.5  --   --    < > = values in this interval not displayed.    Liver Function Tests: Recent Labs  Lab 01/25/24 1854 01/27/24 1013  AST 28 19  ALT 27 16  ALKPHOS 144* 83  BILITOT 0.7 1.0  PROT 7.9 6.4*  ALBUMIN  4.4 3.1*   No results for input(s): LIPASE, AMYLASE in the last 168 hours. No results for input(s): AMMONIA in the last 168 hours.  ABG    Component Value Date/Time   TCO2 25 05/06/2022 0621     Coagulation Profile: Recent Labs  Lab 01/25/24 1854  INR 1.3*    Cardiac Enzymes: No results for input(s): CKTOTAL, CKMB, CKMBINDEX, TROPONINI in the last 168 hours.  HbA1C: Hgb A1c MFr Bld  Date/Time Value Ref Range Status  11/09/2023 09:52 PM 5.9 (H) 4.8 - 5.6 % Final    Comment:    (NOTE) Pre diabetes:           5.7%-6.4%  Diabetes:              >6.4%  Glycemic control for   <7.0% adults with diabetes     CBG: Recent Labs  Lab 01/27/24 1520 01/27/24 1941 01/27/24 2329 01/28/24 0335 01/28/24 0740  GLUCAP 130* 211* 187* 188* 185*    Review of Systems:   + for abdominal pain and nausea. - for emesis, dizziness, lightheadedness, fevers, chills  Past Medical History:  He,  has a past medical history of A-fib (HCC), Anemia, Arthritis, Cancer (HCC), COVID-19, Dysrhythmia, ESRD (end stage renal disease) (HCC) (10/22/2021), Glaucoma (11/18/2021), History of DVT (deep vein thrombosis), Hydronephrosis, Idiopathic neuropathy (10/22/2021), Ileostomy in place [facility]), Obstructive uropathy, Old retinal detachment, total or subtotal, Orthostatic hypotension (10/22/2021), Sleep apnea, Stroke (HCC), Ulcerative colitis (HCC), and Ureteral stricture.   Surgical History:   Past Surgical History:  Procedure Laterality Date   BASAL CELL CARCINOMA EXCISION     10/23   COLON SURGERY     creation of j pouch     and subsequent takedown of j pouch   EYE SURGERY     IR CATHETER TUBE CHANGE  12/18/2023   IR NEPHROSTOMY EXCHANGE LEFT  12/10/2021   IR NEPHROSTOMY EXCHANGE LEFT  04/22/2022   IR NEPHROSTOMY EXCHANGE LEFT  07/29/2022   IR NEPHROSTOMY EXCHANGE LEFT  10/28/2022   IR NEPHROSTOMY EXCHANGE LEFT  02/05/2023   IR NEPHROSTOMY EXCHANGE LEFT  05/07/2023   IR NEPHROSTOMY EXCHANGE LEFT  06/25/2023   IR NEPHROSTOMY EXCHANGE LEFT  07/17/2023   IR NEPHROSTOMY EXCHANGE LEFT  10/22/2023   IR RADIOLOGIST EVAL & MGMT  11/25/2023   IR RADIOLOGIST EVAL & MGMT  12/18/2023   LEFT HEART CATH AND CORONARY ANGIOGRAPHY N/A 07/22/2022   Procedure: LEFT HEART CATH AND CORONARY ANGIOGRAPHY;  Surgeon: Swaziland, Peter M, MD;  Location: [facility] INVASIVE CV LAB;  Service:  Cardiovascular;  Laterality: N/A;   REVISION OF ARTERIOVENOUS GORETEX GRAFT Left 05/06/2022   Procedure: REDO LEFT THIGH ARTERIOVENOUS 4-7 MM GORETEX GRAFT;  Surgeon:  Eliza Lonni RAMAN, MD;  Location: [facility] OR;  Service: Vascular;  Laterality: Left;   SMALL INTESTINE SURGERY     TOTAL COLECTOMY       Social History:   reports that he quit smoking about 40 years ago. His smoking use included cigarettes. He started smoking about 46 years ago. He has a 12 pack-year smoking history. He has never been exposed to tobacco smoke. He has never used smokeless tobacco. He reports current alcohol use of about 5.0 standard drinks of alcohol per week. He reports that he does not use drugs.   Family History:  His family history includes Cancer in his father; Esophageal cancer in his brother; Stroke in his mother.   Allergies Allergies  Allergen Reactions   Baclofen Other (See Comments)    Altered mental status, after accidental overdose     Cephalosporins Rash   Ciprofloxacin Itching and Rash     Home Medications  Prior to Admission medications   Medication Sig Start Date End Date Taking? Authorizing Provider  albuterol  (PROVENTIL ) (2.5 MG/3ML) 0.083% nebulizer solution Take 2.5 mg by nebulization every 6 (six) hours as needed for wheezing or shortness of breath.    [provider]  albuterol  (VENTOLIN  HFA) 108 (90 Base) MCG/ACT inhaler Inhale 1-2 puffs into the lungs every 6 (six) hours as needed for wheezing or shortness of breath.    [provider]  atorvastatin  (LIPITOR) 20 MG tablet Take 1 tablet (20 mg total) by mouth daily. 11/14/23   Sebastian Toribio GAILS, MD  budesonide  (PULMICORT ) 0.5 MG/2ML nebulizer solution Take 0.5 mg by nebulization 2 (two) times daily as needed (for respiratory flares).    [provider]  calcitRIOL  (ROCALTROL ) 0.5 MCG capsule Take 1 capsule (0.5 mcg total) by mouth every Monday, Wednesday, and Friday with hemodialysis. 12/04/23   Danford, Lonni SQUIBB, MD  Cyanocobalamin (VITAMIN B-12 PO) Take 1 capsule by mouth in the morning and at bedtime.    [provider]  dorzolamide  (TRUSOPT ) 2 %  ophthalmic solution Place 1 drop into the right eye at bedtime. 06/17/23   [provider]  ELIQUIS  2.5 MG TABS tablet Take 1 tablet (2.5 mg total) by mouth 2 (two) times daily. 08/07/22   Hobart Powell BRAVO, MD  fludrocortisone  (FLORINEF ) 0.1 MG tablet Take 1 tablet (0.1 mg total) by mouth daily. 12/04/23   Danford, Lonni SQUIBB, MD  folic acid  (FOLVITE ) 1 MG tablet Take 1 tablet (1 mg total) by mouth daily. 07/21/23   Regalado, Belkys A, MD  gabapentin  (NEURONTIN ) 300 MG capsule Take 300 mg by mouth at bedtime. 09/09/23   [provider]  loperamide  (IMODIUM ) 2 MG capsule Take 1 capsule (2 mg total) by mouth as needed for diarrhea or loose stools. 11/13/23   Sebastian Toribio GAILS, MD  midodrine  (PROAMATINE ) 10 MG tablet Take 2 tablets (20 mg total) by mouth 3 (three) times daily with meals. 12/22/23   Katrinka Garnette KIDD, MD  mometasone -formoterol  (DULERA ) 200-5 MCG/ACT AERO Inhale 2 puffs into the lungs 2 (two) times daily. 09/08/23   Rosario Eland I, MD  multivitamin (RENA-VIT) TABS tablet Take 1 tablet by mouth daily.    [provider]  Omega Fatty Acids-Vitamins (OMEGA-3 GUMMIES) CHEW Chew 1 tablet by mouth in the morning and at bedtime.    [provider]  pantoprazole  (  PROTONIX ) 40 MG tablet Take 1 tablet (40 mg total) by mouth 2 (two) times daily. 11/13/23   Sebastian Toribio GAILS, MD  ramelteon  (ROZEREM ) 8 MG tablet Take 1 tablet (8 mg total) by mouth at bedtime. 10/27/23   Katrinka Garnette KIDD, MD  sertraline  (ZOLOFT ) 50 MG tablet Take 25 mg by mouth at bedtime.    [provider]  sevelamer  carbonate (RENVELA ) 800 MG tablet Take 1,600 mg by mouth 3 (three) times daily with meals.    [provider]  Sodium Chloride  Flush (SALINE FLUSH) 0.9 % SOLN Use 5 mLs by Intracatheter route daily as directed. 11/25/23   Caperilla, Marissa N, PA  timolol  (TIMOPTIC ) 0.5 % ophthalmic solution Place 1 drop into both eyes in the morning and at bedtime.    [provider]  traMADol  (ULTRAM ) 50 MG tablet Take 1 tablet (50 mg total) by mouth every 8 (eight) hours as needed (for pain). 12/03/23   Danford, Lonni SQUIBB, MD  TYLENOL  325 MG tablet Take 325-650 mg by mouth every 6 (six) hours as needed for mild pain (pain score 1-3) or headache.    [provider]    Critical care time: 35 mins    Lonni Africa, DO IM Resident PGY-2

## 2024-01-28 NOTE — Progress Notes (Signed)
   01/28/24 0100  Pain Assessment  Pain Scale 0-10  Pain Score 0  Fistula / Graft Left Thigh Arteriovenous fistula  Placement Date: (c)    Placed prior to admission: Yes  Orientation: Left  Access Location: Thigh  Access Type: Arteriovenous fistula  Site Condition No complications  Neurological  Level of Consciousness Alert  Orientation Level Oriented X4  Respiratory  Respiratory Pattern Regular  Chest Assessment Chest expansion symmetrical  Bilateral Breath Sounds Diminished  Psychosocial  Psychosocial (WDL) X   Pt said he does not want Hemodialysis today. The risk was explained to him and that his potassium was 5.4.

## 2024-01-28 NOTE — Plan of Care (Signed)
 Pt still co abd pain. NGT to LIWS 50ml bile colored, pain med given for pain no nausea. No output from drains. Problem: Education: Goal: Knowledge of General Education information will improve Description: Including pain rating scale, medication(s)/side effects and non-pharmacologic comfort measures Outcome: Progressing   Problem: Health Behavior/Discharge Planning: Goal: Ability to manage health-related needs will improve Outcome: Progressing   Problem: Clinical Measurements: Goal: Ability to maintain clinical measurements within normal limits will improve Outcome: Progressing Goal: Will remain free from infection Outcome: Progressing Goal: Diagnostic test results will improve Outcome: Progressing Goal: Respiratory complications will improve Outcome: Progressing Goal: Cardiovascular complication will be avoided Outcome: Progressing   Problem: Activity: Goal: Risk for activity intolerance will decrease Outcome: Progressing   Problem: Coping: Goal: Level of anxiety will decrease Outcome: Progressing   Problem: Elimination: Goal: Will not experience complications related to bowel motility Outcome: Progressing Goal: Will not experience complications related to urinary retention Outcome: Progressing   Problem: Pain Managment: Goal: General experience of comfort will improve and/or be controlled Outcome: Progressing   Problem: Safety: Goal: Ability to remain free from injury will improve Outcome: Progressing   Problem: Skin Integrity: Goal: Risk for impaired skin integrity will decrease Outcome: Progressing   Problem: Education: Goal: Ability to describe self-care measures that may prevent or decrease complications (Diabetes Survival Skills Education) will improve Outcome: Progressing Goal: Individualized Educational Video(s) Outcome: Progressing   Problem: Coping: Goal: Ability to adjust to condition or change in health will improve Outcome: Progressing    Problem: Fluid Volume: Goal: Ability to maintain a balanced intake and output will improve Outcome: Progressing   Problem: Health Behavior/Discharge Planning: Goal: Ability to identify and utilize available resources and services will improve Outcome: Progressing Goal: Ability to manage health-related needs will improve Outcome: Progressing   Problem: Metabolic: Goal: Ability to maintain appropriate glucose levels will improve Outcome: Progressing   Problem: Nutritional: Goal: Maintenance of adequate nutrition will improve Outcome: Progressing Goal: Progress toward achieving an optimal weight will improve Outcome: Progressing   Problem: Skin Integrity: Goal: Risk for impaired skin integrity will decrease Outcome: Progressing   Problem: Tissue Perfusion: Goal: Adequacy of tissue perfusion will improve Outcome: Progressing

## 2024-01-28 NOTE — Progress Notes (Signed)
 Pt refusing HD at this time. Explained the risks and benefits and discussed of critical K+ of 5.4 by HD nurse. Pt states I Want to be back on my regular schedule Monday, Wednesday, Friday.

## 2024-01-28 NOTE — Progress Notes (Signed)
 Patient called daughter during bath to update her . Spoke to daughter who states  I am an Insurance underwriter and why is the doctor not obtaining an EKG if he's having chest pain. Daughter updated with most changes in condition as mentioned in last note. Remains stable. EKG held off based on providers exam, who believes its musculoskeletal in nature.

## 2024-01-28 NOTE — Progress Notes (Incomplete)
 Attending note: I have seen and examined the patient. History, labs and imaging reviewed.  88 Y/O with PMH of ESRD, dCHF, afib on eliquis , ulcerative colitis, CVA. Admitted for SBO being followed by surgery. Transferred to ICU for hypotension requiring levophed . Cardiology is on board for pericardial effusion without tamponade  Still on norespi drip. Failed a oral feeding trial yesterday and is back to NPO and LIS of NG tube  Blood pressure (!) 81/53, pulse 83, temperature 98 F (36.7 C), temperature source Oral, resp. rate 11, height 5' 8 (1.727 m), weight 74.2 kg, SpO2 92%. Gen:      No acute distress HEENT:  EOMI, sclera anicteric Neck:     No masses; no thyromegaly Lungs:    Clear to auscultation bilaterally; normal respiratory effort*** CV:         Regular rate and rhythm; no murmurs Abd:      + bowel sounds; soft, non-tender; no palpable masses, no distension Ext:    No edema; adequate peripheral perfusion Neuro: alert and oriented x 3 Psych: normal mood and affect   Labs/Imaging personally reviewed, significant for Na 134, K 5.4 BUN/Cr 69/8.13, Hb 9.9  Assessment/plan: cute on chronic hypotension due to adrenal insufficiency Unable to take midodrine  On stress dose steroids. Was on florinef  as outpatient Wean pressors as tolerated   SBO Still NPO Conservative management per surgery   ESRD on HD Obstructive uropathy-s/p left PCN placement since 2023 in [place]  Dialysis per nephrology   Chronic pelvic abscess Previous drain placed by IR 07/13/23 and fell out 08/2023.  Patient underwent left transgluteal drain placement with IR 11/11/23 and 01/07/2023 due to a chronic pre sacral fluid collection. Culture at the time of drain replacement was positive for ESBL E coli and Klebsiella No need for continues antibiotics per ID  The patient is critically ill with multiple organ systems failure and requires high complexity decision making for assessment and support, frequent evaluation  and titration of therapies, application of advanced monitoring technologies and extensive interpretation of multiple databases.  Critical care time - 35 mins. This represents my time independent of the NPs time taking care of the pt.  Aithan Wynia MD [facility] 01/28/2024, 9:48 AM

## 2024-01-28 NOTE — Progress Notes (Signed)
 Subjective: CC: Seen with RN and primary team.  Patient was doing well with NGT clamped and cld yesterday with plan for removal but developed abdominal pain and nausea. NGT placed to LIWS overnight. He was still drinking CLD this am with only 50cc output overnight from his NGT. He still complains of nausea after po intake and abdominal pain that sounds like a belt like pain across his abdomen. Some air but no stool from ileostomy since I saw him yesterday per RN.   He is currently afebrile without tachycardia. Was on increased levo need overnight to 7mcg/min - back down to 71mcg/min when I saw him this morning. WBC wnl.   Per notes, refused HD this morning.   Objective: Vital signs in last 24 hours: Temp:  [97.7 F (36.5 C)-98.4 F (36.9 C)] 98 F (36.7 C) (07/24 0407) Pulse Rate:  [64-96] 83 (07/24 0830) Resp:  [7-26] 11 (07/24 0830) BP: (72-130)/(35-81) 81/53 (07/24 0830) SpO2:  [76 %-100 %] 92 % (07/24 0830) Last BM Date : 01/27/24  Intake/Output from previous day: 07/23 0701 - 07/24 0700 In: 789.9 [I.V.:589.9; IV Piggyback:199.9] Out: 125 [Stool:125] Intake/Output this shift: Total I/O In: 65.6 [I.V.:65.6] Out: -   PE: Gen:  Alert, NAD, pleasant HEENT: NGT in place on LIWS with scant output in cannister Abd: More distended this am but still soft. Generalized ttp without rigidity or guarding. Ileostomy bag with scant air and liquid in bag. JP drain SS    Lab Results:  Recent Labs    01/27/24 0435 01/28/24 0349  WBC 7.5 9.8  HGB 8.8* 9.9*  HCT 29.4* 32.3*  PLT 145* 263   BMET Recent Labs    01/27/24 1013 01/28/24 0349  NA 139 134*  K 5.4* 5.4*  CL 101 99  CO2 20* 21*  GLUCOSE 131* 192*  BUN 54* 69*  CREATININE 7.50* 8.13*  CALCIUM  8.7* 8.8*   PT/INR Recent Labs    01/25/24 1854  LABPROT 16.9*  INR 1.3*   CMP     Component Value Date/Time   NA 134 (L) 01/28/2024 0349   NA 138 04/10/2023 1541   K 5.4 (H) 01/28/2024 0349   CL 99  01/28/2024 0349   CO2 21 (L) 01/28/2024 0349   GLUCOSE 192 (H) 01/28/2024 0349   BUN 69 (H) 01/28/2024 0349   BUN 19 04/10/2023 1541   CREATININE 8.13 (H) 01/28/2024 0349   CALCIUM  8.8 (L) 01/28/2024 0349   PROT 6.4 (L) 01/27/2024 1013   ALBUMIN  3.1 (L) 01/27/2024 1013   AST 19 01/27/2024 1013   ALT 16 01/27/2024 1013   ALKPHOS 83 01/27/2024 1013   BILITOT 1.0 01/27/2024 1013   GFRNONAA 6 (L) 01/28/2024 0349   Lipase     Component Value Date/Time   LIPASE 49 09/06/2023 1808    Studies/Results: VAS US  LOWER EXTREMITY VENOUS (DVT) Result Date: 01/28/2024  Lower Venous DVT Study Patient Name:  LORENA CLEARMAN  Date of Exam:   01/27/2024 Medical Rec #: [id number]        Accession #:    [id number] Date of Birth: [birth year]        Patient Gender: M Patient Age:   88 years Exam Location:  [facility] Procedure:      VAS US  LOWER EXTREMITY VENOUS (DVT) Referring Phys: NORLEEN PAYNE --------------------------------------------------------------------------------  Indications: Swelling, and Edema.  Limitations: Bandages and urine bag on left thigh. Comparison Study: No prior Performing Technologist: Lataco Crump,RVT  Examination Guidelines:  A complete evaluation includes B-mode imaging, spectral Doppler, color Doppler, and power Doppler as needed of all accessible portions of each vessel. Bilateral testing is considered an integral part of a complete examination. Limited examinations for reoccurring indications may be performed as noted. The reflux portion of the exam is performed with the patient in reverse Trendelenburg.  +---------+---------------+---------+-----------+----------+--------------+ RIGHT    CompressibilityPhasicitySpontaneityPropertiesThrombus Aging +---------+---------------+---------+-----------+----------+--------------+ CFV      Full           Yes      Yes                                 +---------+---------------+---------+-----------+----------+--------------+ SFJ       Full                                                        +---------+---------------+---------+-----------+----------+--------------+ FV Prox  Full                                                        +---------+---------------+---------+-----------+----------+--------------+ FV Mid   Full                                                        +---------+---------------+---------+-----------+----------+--------------+ FV DistalFull                                                        +---------+---------------+---------+-----------+----------+--------------+ PFV      Full                                                        +---------+---------------+---------+-----------+----------+--------------+ POP      Full           Yes      Yes                                 +---------+---------------+---------+-----------+----------+--------------+ PTV      Full                                                        +---------+---------------+---------+-----------+----------+--------------+ PERO     Full                                                        +---------+---------------+---------+-----------+----------+--------------+   +---------+---------------+---------+-----------+----------+-------------------+  LEFT     CompressibilityPhasicitySpontaneityPropertiesThrombus Aging      +---------+---------------+---------+-----------+----------+-------------------+ CFV                                                   bandage             +---------+---------------+---------+-----------+----------+-------------------+ SFJ                                                   bandage             +---------+---------------+---------+-----------+----------+-------------------+ FV Prox  Full                                                             +---------+---------------+---------+-----------+----------+-------------------+ FV Mid    Full                                                             +---------+---------------+---------+-----------+----------+-------------------+ FV DistalFull                                         Thckened walls      +---------+---------------+---------+-----------+----------+-------------------+ PFV                                                   Not well visualized +---------+---------------+---------+-----------+----------+-------------------+ POP      Full                                         thickened walls     +---------+---------------+---------+-----------+----------+-------------------+ PTV      Full                                                             +---------+---------------+---------+-----------+----------+-------------------+ PERO                                                  Not well visualized +---------+---------------+---------+-----------+----------+-------------------+     Summary: RIGHT: - There is no evidence of deep vein thrombosis in the lower extremity.  - No cystic structure found in the popliteal fossa.  LEFT: - There is no evidence of deep vein thrombosis in the lower extremity.  However, portions of this examination were limited- see technologist comments above.  - No cystic structure found in the popliteal fossa.  *See table(s) above for measurements and observations. Electronically signed by Lonni Gaskins MD on 01/28/2024 at 8:29:23 AM.    Final    DG Abd Portable 1V Result Date: 01/27/2024 CLINICAL DATA:  Small-bowel obstruction. EXAM: PORTABLE ABDOMEN - 1 VIEW COMPARISON:  Radiographs 01/26/2024 and 01/25/2024.  CT 01/25/2024. FINDINGS: 0910 hours. Two supine views of the abdomen are submitted. The most recent examination was labeled an 8 hour delay for portable small-bowel follow-through study. Contrast has progressed into dilated small bowel and is diluted. Right mid abdominal ileostomy noted. The tip of the enteric  tube is incompletely visualized, although is similar in position within the right upper quadrant, likely in the distal stomach or proximal duodenum. Percutaneous nephrostomy and surgical drain are in place. Penile prosthesis noted. IMPRESSION: Contrast has progressed into dilated small bowel and is diluted. Findings are consistent with persistent small bowel obstruction. Electronically Signed   By: Elsie Perone M.D.   On: 01/27/2024 10:58   DG Abd Portable 1V-Small Bowel Obstruction Protocol-initial, 8 hr delay Result Date: 01/26/2024 CLINICAL DATA:  8 hour delay bowel obstruction EXAM: PORTABLE ABDOMEN - 1 VIEW COMPARISON:  01/26/2024, CT 01/25/2024 FINDINGS: Enteric tube tip overlies the gastroduodenal region. Enteral contrast within the stomach and small bowel. Dilute contrast present within dilated small bowel loops measuring up to 5.1 cm, down to the pelvis. No contrast visible in the vicinity of the right abdominal ostomy. Patient has history of proctocolectomy. Percutaneous nephrostomy tube in the left abdomen. Drainage catheter in the pelvis. Radial lucent reservoir in the right pelvis for penile implant. IMPRESSION: Enteral contrast within the stomach with dilute contrast within dilated proximal to mid small bowel to the level of the pelvis. No enteral contrast visible in the region of the patient's right abdominal ileostomy. Electronically Signed   By: Luke Bun M.D.   On: 01/26/2024 22:19   DG Abd Portable 1V-Small Bowel Protocol-Position Verification Result Date: 01/26/2024 CLINICAL DATA:  Nasogastric tube placement. EXAM: PORTABLE ABDOMEN - 1 VIEW COMPARISON:  January 25, 2024. FINDINGS: Distal tip of nasogastric tube is seen in expected position of distal stomach. Left-sided nephrostomy is again noted. IMPRESSION: Nasogastric tube tip seen in expected position of distal stomach. Electronically Signed   By: Lynwood Landy Raddle M.D.   On: 01/26/2024 13:36    Anti-infectives: Anti-infectives  (From admission, onward)    Start     Dose/Rate Route Frequency Ordered Stop   01/26/24 2200  metroNIDAZOLE  (FLAGYL ) IVPB 500 mg  Status:  Discontinued        500 mg 100 mL/hr over 60 Minutes Intravenous Every 12 hours 01/26/24 2143 01/27/24 1124   01/26/24 2015  vancomycin  (VANCOREADY) IVPB 500 mg/100 mL        500 mg 100 mL/hr over 60 Minutes Intravenous  Once 01/26/24 1926 01/27/24 0000   01/26/24 2015  aztreonam  (AZACTAM ) 2 g in sodium chloride  0.9 % 100 mL IVPB  Status:  Discontinued        2 g 200 mL/hr over 30 Minutes Intravenous Every 24 hours 01/26/24 1926 01/27/24 1124   01/26/24 1926  vancomycin  variable dose per unstable renal function (pharmacist dosing)  Status:  Discontinued         Does not apply See admin instructions 01/26/24 1926 01/27/24 1124   01/25/24 2000  aztreonam  (AZACTAM ) 2 g in sodium chloride  0.9 % 100 mL  IVPB        2 g 200 mL/hr over 30 Minutes Intravenous  Once 01/25/24 1958 01/25/24 2101   01/25/24 2000  metroNIDAZOLE  (FLAGYL ) IVPB 500 mg        500 mg 100 mL/hr over 60 Minutes Intravenous  Once 01/25/24 1958 01/25/24 2235   01/25/24 2000  vancomycin  (VANCOCIN ) IVPB 1000 mg/200 mL premix        1,000 mg 200 mL/hr over 60 Minutes Intravenous  Once 01/25/24 1958 01/26/24 0019        Assessment/Plan SBO - CT scan showed a small bowel obstruction with a transition in the pelvis. 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 He has a history of UC s/p TAC and ileostomy creation. He has had multiple SBO's in the past - No current indication for emergency surgery.  - Failed clamping trial 7/23 with increased pain, nausea and more distension on exam today. Make NPO, place NGT to LIWS, and repeat xray today.  - Hopefully patient will improve with conservative management. If patient fails to improve with conservative  management, they may require exploratory surgery during admission - We will follow with you.  Recurrent presacral fluid collection with drain in place - currently SS. CT with Left trans gluteal percutaneous drainage catheter with pigtail inthe presacral region at the site of prior fluid collection. Residual soft tissue thickening in the region with small residual fluid collection inferior to the pigtail measuring about 14 x 16 mm. IR has been following this as an outpatient.   FEN - NPO, NGT to LIWS, IVF per primary. Electrolytes per neprhology VTE - SCDs, hold Eliquis , heparin  gtt.  ID - None per ID  - Per primary -  Acute on Chronic hypotension - on Midodrine  and florinef  at baseline. Currently on low dose levo.  Hx pericarditis with evidence of moderate pericardial effusion on imaging this admission and elevated TN's - cardiology following. They plan repeat echo 7/25 ESRD on HD MWF dCHF A-fib on Eliquis  - please hold eliquis ,  Hx ulcerative colitis s/p TAC and ileostomy  CVA COPD Chronic sacral osteomyelitis (recently completed ertapenem  and vanc) Chronic hypotension on Midodrine  and florinef    I reviewed nursing notes, hospitalist notes, last 24 h vitals and pain scores, last 48 h intake and output, last 24 h labs and trends, and last 24 h imaging results.   LOS: 2 days    Ozell CHRISTELLA Shaper, [facility] 01/28/2024, 10:10 AM Please see Amion for pager number during day hours 7:00am-4:30pm

## 2024-01-28 NOTE — Progress Notes (Addendum)
 PHARMACY - ANTICOAGULATION CONSULT NOTE  Pharmacy Consult for heparin  Indication: AF  Allergies  Allergen Reactions   Baclofen Other (See Comments)    Altered mental status, after accidental overdose     Cephalosporins Rash   Ciprofloxacin Itching and Rash    Patient Measurements: Height: 5' 8 (172.7 cm) Weight: 74.2 kg (163 lb 9.3 oz) IBW/kg (Calculated) : 68.4 HEPARIN  DW (KG): 74.2  Vital Signs: Temp: 98 F (36.7 C) (07/24 0407) Temp Source: Oral (07/24 0407) BP: 81/53 (07/24 0830) Pulse Rate: 83 (07/24 0830)  Labs: Recent Labs    01/25/24 1854 01/26/24 0944 01/26/24 1926 01/26/24 2116 01/27/24 0024 01/27/24 0435 01/27/24 1013 01/28/24 0349  HGB 10.2* 8.8*  --   --   --  8.8*  --  9.9*  HCT 33.2* 29.1*  --   --   --  29.4*  --  32.3*  PLT 233 156  --   --   --  145*  --  263  APTT  --   --   --   --  58*  --  78* 75*  LABPROT 16.9*  --   --   --   --   --   --   --   INR 1.3*  --   --   --   --   --   --   --   HEPARINUNFRC  --   --   --   --  0.90*  --   --  0.87*  CREATININE 6.02* 6.30*  --   --   --   --  7.50* 8.13*  TROPONINIHS  --   --  24* 23*  --   --   --   --     Estimated Creatinine Clearance: 6.4 mL/min (A) (by C-G formula based on SCr of 8.13 mg/dL (H)).   Medical History: Past Medical History:  Diagnosis Date   A-fib (HCC)    Anemia    Arthritis    Cancer (HCC)    Basal cell   COVID-19    2021   Dysrhythmia    Afib-controlled on eliquis    ESRD (end stage renal disease) (HCC) 10/22/2021   Glaucoma 11/18/2021   History of DVT (deep vein thrombosis)    Hydronephrosis    managed wtih a PCN   Idiopathic neuropathy 10/22/2021   lyrica     Ileostomy in place [facility])    Obstructive uropathy    With chronic left nephrostomy   Old retinal detachment, total or subtotal    Orthostatic hypotension 10/22/2021   Sleep apnea    does not need a machine   Stroke (HCC)    Ulcerative colitis (HCC)    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      albuterol  (VENTOLIN  HFA) 108 (90 Base) MCG/ACT inhaler Inhale 1-2 puffs into the lungs every 6 (six) hours as needed for wheezing or shortness of breath.      atorvastatin  (LIPITOR) 20 MG tablet Take 1 tablet (20 mg total) by mouth daily. 90 tablet 0    budesonide  (PULMICORT ) 0.5 MG/2ML nebulizer solution Take 0.5 mg by nebulization 2 (two) times daily as needed (for respiratory flares).      calcitRIOL  (  ROCALTROL ) 0.5 MCG capsule Take 1 capsule (0.5 mcg total) by mouth every Monday, Wednesday, and Friday with hemodialysis.      Cyanocobalamin (VITAMIN B-12 PO) Take 1 capsule by mouth in the morning and at bedtime.      dorzolamide  (TRUSOPT ) 2 % ophthalmic solution Place 1 drop into the right eye at bedtime.      ELIQUIS  2.5 MG TABS tablet Take 1 tablet (2.5 mg total) by mouth 2 (two) times daily. 60 tablet 11    fludrocortisone  (FLORINEF ) 0.1 MG tablet Take 1 tablet (0.1 mg total) by mouth daily.      folic acid  (FOLVITE ) 1 MG tablet Take 1 tablet (1 mg total) by mouth daily. 30 tablet 0    gabapentin  (NEURONTIN ) 300 MG capsule Take 300 mg by mouth at bedtime.      loperamide  (IMODIUM ) 2 MG capsule Take 1 capsule (2 mg total) by mouth as needed for diarrhea or loose stools. 20 capsule 0    midodrine  (PROAMATINE ) 10 MG tablet Take 2 tablets (20 mg total) by mouth 3 (three) times daily with meals.      mometasone -formoterol  (DULERA ) 200-5 MCG/ACT AERO Inhale 2 puffs into the lungs 2 (two) times daily. 1 each 1    multivitamin (RENA-VIT) TABS tablet Take 1 tablet by mouth daily.      Omega Fatty Acids-Vitamins (OMEGA-3 GUMMIES) CHEW Chew 1 tablet by mouth in the morning and at bedtime.      pantoprazole  (PROTONIX ) 40 MG tablet  Take 1 tablet (40 mg total) by mouth 2 (two) times daily. 60 tablet 1    ramelteon  (ROZEREM ) 8 MG tablet Take 1 tablet (8 mg total) by mouth at bedtime. 30 tablet 5    sertraline  (ZOLOFT ) 50 MG tablet Take 25 mg by mouth at bedtime.      sevelamer  carbonate (RENVELA ) 800 MG tablet Take 1,600 mg by mouth 3 (three) times daily with meals.      Sodium Chloride  Flush (SALINE FLUSH) 0.9 % SOLN Use 5 mLs by Intracatheter route daily as directed. 150 mL 1    timolol  (TIMOPTIC ) 0.5 % ophthalmic solution Place 1 drop into both eyes in the morning and at bedtime.      traMADol  (ULTRAM ) 50 MG tablet Take 1 tablet (50 mg total) by mouth every 8 (eight) hours as needed (for pain). 12 tablet 0    TYLENOL  325 MG tablet Take 325-650 mg by mouth every 6 (six) hours as needed for mild pain (pain score 1-3) or headache.      Scheduled:   [START ON 01/29/2024] calcitRIOL   0.5 mcg Oral Q M,W,F-HD   Chlorhexidine  Gluconate Cloth  6 each Topical Q0600   dorzolamide   1 drop Right Eye QHS   fluticasone  furoate-vilanterol  1 puff Inhalation Daily   hydrocortisone  sod succinate (SOLU-CORTEF ) inj  100 mg Intravenous TID   multivitamin  1 tablet Oral Daily   pantoprazole  (PROTONIX ) IV  40 mg Intravenous Q24H   sertraline   25 mg Oral QHS   sevelamer  carbonate  1,600 mg Oral TID WC   timolol   1 drop Both Eyes BID    Assessment: 88 yo male with SBO. He has a history of afib on apixaban  at home (timing of last dose unknown, PTA) - Hg stable at 9.9 - aPTT therapeutic at 75 seconds - heparin  level 0.87 international units/ mL   Goal of Therapy:  Heparin  level 0.3-0.7 units/ml aPTT 66-102 seconds Monitor platelets by anticoagulation protocol: Yes   Plan:  -  Continue heparin  drip at current rate 1200 units/hr  - Daily aPTT levels  - Daily heparin  levels until correlated  - Follow up with signs and symptoms of bleeding and transition to PO anticoagulant as able.  Vermell Mccallum, PharmD Clinical  Pharmacist **Pharmacist phone directory can now be found on [email or web address] ([email or web address]).  Listed under [facility].

## 2024-01-28 NOTE — Progress Notes (Signed)
 [facility] Progress Note  Subjective:  Seen in ICU Levo gtt 3-6 yest , and up to 8 mcg today   Vitals:   01/28/24 1145 01/28/24 1151 01/28/24 1153 01/28/24 1200  BP: (!) 87/63   (!) 85/60  Pulse: 71 77 83 81  Resp: 14 16 (!) 7 (!) 6  Temp:      TempSrc:      SpO2: 90% (!) 81% 93% 97%  Weight:      Height:        Exam: Gen alert, no distress, not on O2 No jvd or bruits Chest clear bilat to bases RRR no RG Abd soft ntnd no mass or ascites +bs, RLQ ostomy bag Ext trace pretib edema bilat Neuro is alert, Ox 3 , nf    L thigh AVG +bruit    Home bp meds: Florinef  0.1mg  every day Midodrine  20mg  tid    OP HD: NW MWF 2h  B350   71.5kg   2K bath   AVG   Heparin  none Rocaltrol  0.5 mcg po three times per week Mircera 225 mcg IV q 2 wks, last 7/21, due 8/04 Last OP HD 7/21, post wt 73.1kg     Assessment/ Plan: SBO: sp NG tube placement, per gen surg/ pmd.  Recurrent presacral fluid collection: w/ drain in place from prior admit, gen surg following Hypotension: acute on chronic, usual BP's at OP HD are in the 90s-100s. BP's here 80s- 90s w/ levo support. Not real symptomatic. Home midodrine  is on hold.  ESRD: on HD MWF. Full OP HD Monday. Refused HD yesterday. Next HD tomorrow, or might need CRRT given hypotension.  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 Follow.  L percutaneous nephrostomy tube: exit site is clean, not tender         Myer Fret MD  [facility] 01/28/2024, 12:56 PM  Recent Labs  Lab 01/25/24 1854 01/26/24 0944 01/27/24 0435 01/27/24 1013 01/28/24 0349  HGB 10.2*   < > 8.8*  --  9.9*  ALBUMIN  4.4  --   --  3.1*  --   CALCIUM  9.8  --   --  8.7* 8.8*  CREATININE 6.02*   < >  --  7.50* 8.13*  K 4.1  --   --  5.4* 5.4*   < > = values in this interval not displayed.   No results for input(s): IRON, TIBC, FERRITIN in the last 168 hours. Inpatient medications:  Chlorhexidine   Gluconate Cloth  6 each Topical Q0600   dorzolamide   1 drop Right Eye QHS   fluticasone  furoate-vilanterol  1 puff Inhalation Daily   hydrocortisone  sod succinate (SOLU-CORTEF ) inj  100 mg Intravenous TID   insulin  aspart  0-15 Units Subcutaneous Q4H   pantoprazole  (PROTONIX ) IV  40 mg Intravenous Q24H   timolol   1 drop Both Eyes BID    acetaminophen  Stopped (01/28/24 0945)   albumin  human     heparin  1,200 Units/hr (01/28/24 1200)   norepinephrine  (LEVOPHED ) Adult infusion 8 mcg/min (01/28/24 1200)   albumin  human, HYDROmorphone  (DILAUDID ) injection, ipratropium-albuterol , ondansetron  (ZOFRAN ) IV

## 2024-01-28 NOTE — Progress Notes (Signed)
 eLink Physician-Brief Progress Note Patient Name: Joshua Riley DOB: [birth year] MRN: [id number]   Date of Service  01/28/2024  HPI/Events of Note  Patient with reproducible anterior chest wall pain which appears to be musculoskeletal.  eICU Interventions  PRN oxycodone  and Tylenol  ordered enterally.        Shiane Wenberg U Amaryah Mallen 01/28/2024, 8:28 PM

## 2024-01-28 NOTE — Plan of Care (Signed)
  Problem: Education: Goal: Knowledge of General Education information will improve Description: Including pain rating scale, medication(s)/side effects and non-pharmacologic comfort measures Outcome: Progressing   Problem: Health Behavior/Discharge Planning: Goal: Ability to manage health-related needs will improve Outcome: Progressing   Problem: Clinical Measurements: Goal: Ability to maintain clinical measurements within normal limits will improve Outcome: Progressing Goal: Will remain free from infection Outcome: Progressing Goal: Diagnostic test results will improve Outcome: Progressing Goal: Respiratory complications will improve Outcome: Progressing Goal: Cardiovascular complication will be avoided Outcome: Progressing   Problem: Activity: Goal: Risk for activity intolerance will decrease Outcome: Progressing   Problem: Coping: Goal: Level of anxiety will decrease Outcome: Progressing   Problem: Pain Managment: Goal: General experience of comfort will improve and/or be controlled Outcome: Progressing   Problem: Safety: Goal: Ability to remain free from injury will improve Outcome: Progressing   Problem: Skin Integrity: Goal: Risk for impaired skin integrity will decrease Outcome: Progressing

## 2024-01-28 NOTE — Progress Notes (Addendum)
 Pt. complaining of chest pain. he has not been dialyzed since Monday due to refusing with reason  I just didn't feel like it . pain is an 8, but his BP is very low, matter of fact he's on levophed  at  . I have obtained an EKG and loaded it for provider review. I spoke to Dr Benita with renal ,do not dialyze early, just wait till tomorrow. But give 10mg  to lokelma  now for hyperkalemia. Patient is on heparin  drip.   Spoek to Dr MALVA with [facility] CCM. Ordered to hold off on EKG for now.

## 2024-01-29 ENCOUNTER — Inpatient Hospital Stay (HOSPITAL_COMMUNITY)

## 2024-01-29 DIAGNOSIS — I959 Hypotension, unspecified: Secondary | ICD-10-CM | POA: Diagnosis not present

## 2024-01-29 DIAGNOSIS — I3139 Other pericardial effusion (noninflammatory): Secondary | ICD-10-CM | POA: Diagnosis not present

## 2024-01-29 DIAGNOSIS — N186 End stage renal disease: Secondary | ICD-10-CM | POA: Diagnosis not present

## 2024-01-29 DIAGNOSIS — K56609 Unspecified intestinal obstruction, unspecified as to partial versus complete obstruction: Secondary | ICD-10-CM | POA: Diagnosis not present

## 2024-01-29 DIAGNOSIS — Z992 Dependence on renal dialysis: Secondary | ICD-10-CM | POA: Diagnosis not present

## 2024-01-29 LAB — APTT
aPTT: 68 s — ABNORMAL HIGH (ref 24–36)
aPTT: 56 s — ABNORMAL HIGH (ref 24–36)

## 2024-01-29 LAB — CBC
HCT: 32 % — ABNORMAL LOW (ref 39.0–52.0)
Hemoglobin: 9.7 g/dL — ABNORMAL LOW (ref 13.0–17.0)
MCH: 36.1 pg — ABNORMAL HIGH (ref 26.0–34.0)
MCHC: 30.3 g/dL (ref 30.0–36.0)
MCV: 119 fL — ABNORMAL HIGH (ref 80.0–100.0)
Platelets: 229 K/uL (ref 150–400)
RBC: 2.69 MIL/uL — ABNORMAL LOW (ref 4.22–5.81)
RDW: 18.8 % — ABNORMAL HIGH (ref 11.5–15.5)
WBC: 7.8 K/uL (ref 4.0–10.5)
nRBC: 1.4 % — ABNORMAL HIGH (ref 0.0–0.2)

## 2024-01-29 LAB — GLUCOSE, CAPILLARY
Glucose-Capillary: 112 mg/dL — ABNORMAL HIGH (ref 70–99)
Glucose-Capillary: 137 mg/dL — ABNORMAL HIGH (ref 70–99)
Glucose-Capillary: 139 mg/dL — ABNORMAL HIGH (ref 70–99)
Glucose-Capillary: 147 mg/dL — ABNORMAL HIGH (ref 70–99)
Glucose-Capillary: 150 mg/dL — ABNORMAL HIGH (ref 70–99)
Glucose-Capillary: 176 mg/dL — ABNORMAL HIGH (ref 70–99)

## 2024-01-29 LAB — BASIC METABOLIC PANEL WITH GFR
Anion gap: 19 — ABNORMAL HIGH (ref 5–15)
BUN: 82 mg/dL — ABNORMAL HIGH (ref 8–23)
CO2: 18 mmol/L — ABNORMAL LOW (ref 22–32)
Calcium: 7.9 mg/dL — ABNORMAL LOW (ref 8.9–10.3)
Chloride: 94 mmol/L — ABNORMAL LOW (ref 98–111)
Creatinine, Ser: 8.67 mg/dL — ABNORMAL HIGH (ref 0.61–1.24)
GFR, Estimated: 6 mL/min — ABNORMAL LOW (ref >60)
Glucose, Bld: 154 mg/dL — ABNORMAL HIGH (ref 70–99)
Potassium: 5.7 mmol/L — ABNORMAL HIGH (ref 3.5–5.1)
Sodium: 131 mmol/L — ABNORMAL LOW (ref 135–145)

## 2024-01-29 LAB — ECHOCARDIOGRAM LIMITED
Height: 68 in
Weight: 2638.47 [oz_av]

## 2024-01-29 LAB — HEPATITIS B SURFACE ANTIBODY, QUANTITATIVE: Hep B S AB Quant (Post): 10.8 m[IU]/mL

## 2024-01-29 LAB — HEPARIN LEVEL (UNFRACTIONATED)
Heparin Unfractionated: 0.67 [IU]/mL (ref 0.30–0.70)
Heparin Unfractionated: 0.52 [IU]/mL (ref 0.30–0.70)

## 2024-01-29 MED ORDER — MIDODRINE HCL 5 MG PO TABS
20.0000 mg | ORAL_TABLET | Freq: Three times a day (TID) | ORAL | Status: DC
  Administered 2024-01-29 – 2024-01-31 (×5): 20 mg via ORAL
  Filled 2024-01-29 (×5): qty 4

## 2024-01-29 MED ORDER — CHLORHEXIDINE GLUCONATE CLOTH 2 % EX PADS
6.0000 | MEDICATED_PAD | Freq: Every day | CUTANEOUS | Status: DC
  Administered 2024-01-30 – 2024-02-01 (×3): 6 via TOPICAL

## 2024-01-29 MED ORDER — LIDOCAINE HCL (PF) 1 % IJ SOLN: 5.0000 mL | INTRAMUSCULAR | Status: DC | PRN

## 2024-01-29 MED ORDER — PENTAFLUOROPROP-TETRAFLUOROETH EX AERO: 1.0000 | INHALATION_SPRAY | CUTANEOUS | Status: DC | PRN

## 2024-01-29 MED ORDER — ALBUMIN HUMAN 25 % IV SOLN
INTRAVENOUS | Status: AC
  Filled 2024-01-29: qty 200

## 2024-01-29 MED ORDER — RAMELTEON 8 MG PO TABS
8.0000 mg | ORAL_TABLET | Freq: Every evening | ORAL | Status: AC | PRN
  Administered 2024-01-30 (×2): 8 mg via ORAL
  Filled 2024-01-29 (×3): qty 1

## 2024-01-29 MED ORDER — ALTEPLASE 2 MG IJ SOLR
2.0000 mg | Freq: Once | INTRAMUSCULAR | Status: DC | PRN
Start: 2024-01-29 — End: 2024-02-02

## 2024-01-29 MED ORDER — ANTICOAGULANT SODIUM CITRATE 4% (200MG/5ML) IV SOLN: 5.0000 mL | Status: DC | PRN

## 2024-01-29 MED ORDER — NEPRO/CARBSTEADY PO LIQD: 237.0000 mL | ORAL | Status: DC | PRN

## 2024-01-29 MED ORDER — LIDOCAINE-PRILOCAINE 2.5-2.5 % EX CREA: 1.0000 | TOPICAL_CREAM | CUTANEOUS | Status: DC | PRN

## 2024-01-29 MED ORDER — HEPARIN SODIUM (PORCINE) 1000 UNIT/ML DIALYSIS: 1000.0000 [IU] | INTRAMUSCULAR | Status: DC | PRN

## 2024-01-29 NOTE — Progress Notes (Signed)
 NAME:  Joshua Riley, MRN:  [id number], DOB:  [birth year], LOS: 3 ADMISSION DATE:  01/25/2024, CONSULTATION DATE:  7/22 REFERRING MD:  Dr. Lee, CHIEF COMPLAINT:  hypotension; sbo   History of Present Illness:  Patient is a 88 year old male with pertinent PMH ESRD on HD MWF, dCHF, A-fib on Eliquis , ulcerative colitis w/ ileostomy, CVA, chronic sacral osteomyelitis (recently completed ertapenem  and vanc), orthostatic hypotension presents to [facility] on 7/22 with SBO.   Patient having abd on 7/21 that progressively worsened w/ N/V came to [facility] ED later that night. On arrival patient tachycardic and hypotensive. Patient afebrile and wbc 11.  Cultures obtained, given iv fluids, started on aztreonam  and vanc. LA 2.6 then 1.6. PCT 2.3. CXR no acute findings. Covid/flu/rsv negative. CT abd/pelvis w/ small parastomal hernia contains fat and a knuckle of small bowel; Interim development of moderate to marked fluid distension of the stomach with multiple dilated fluid-filled loops of proximal to mid small bowel and fecalized bowel in the pelvis consistent with small bowel obstruction; moderate pericardial effusion. Surgery consulted. NG tube placed and made npo. Trop 250. Echo lvef 60-65%; moderate pericardial effusion; RA pressure 15. Cards consulted. On 7/22 patient remained hypotensive despite iv fluids. Unable to give home midodrine /florinef  while npo. PCCM consulted.   Pertinent  Medical History   Adrenal insufficiency, chronic hypotension, Afib on Eliquis , Hx DVT, Hydronephrosis with pcn, peripheral neuropathy, Ileostomy, Ulcerative colitis, orthostatic hypotension  Significant Hospital Events: Including procedures, antibiotic start and stop dates in addition to other pertinent events   7/21 admit w/ sbo and hypotension 7/22 remains hypotensive despite fluids; pccm consulted  Interim History / Subjective:  Yesterday returned to NPO, NG to suction. Feels better this am than yesterday. Less nausea and  abdominal pain. Notes large brown output from ileostomy. Is about to get dialysis.  Objective    Blood pressure 108/78, pulse 100, temperature 97.6 F (36.4 C), temperature source Oral, resp. rate (!) 25, height 5' 8 (1.727 m), weight 75 kg, SpO2 94%.        Intake/Output Summary (Last 24 hours) at 01/29/2024 0654 Last data filed at 01/29/2024 0617 Gross per 24 hour  Intake 1171.25 ml  Output 650 ml  Net 521.25 ml   Filed Weights   01/25/24 1849 01/26/24 0703 01/29/24 0415  Weight: 72.6 kg 74.2 kg 75 kg   Examination: General:  NAD. More alert than yesterday HEENT: MM pink/moist; [place] and ng tube in place Neuro: Aox3; perrl CV: s1s2, irregularly irregular rate 70-80s, no m/r/g PULM:  trace rales in the bases; [place] 2L  GI: Ileostomy with liquid brown output. Abdominal distention is less than yesterday. Extremities: warm/dry, no edema; left femoral fistula w/ no drainage/erythema  Resolved problem list   Assessment and Plan   A on C Hypotension without shock History hypotension, adrenal insufficiency Pt takes midodrine  and florinef  out of hospital but unable given SBO. Requiring low-mod norepinephrine  as a result. Mentation intact, warm extremities, not acute shock. Currently with stress steroids as well. Will return to oral midodrine  and fludrocortisone  as able.  - Pressers titration adjusted for BP goals for systolic above 80 - stress steroids - resume po medicines ASAP upon diet advance. Fludrocortisone  0.1 every day and midodrine  20mg  TID. - monitor mental status   SBO Hx of ulcerative colitis w/ ileostomy Surgery following, thank you. Is having output from ileostomy and he feels well. Will advance again, clear liquid diet. - advance to clears - can restart orals if tolerates clears, ng tube  pulled - pain management prn. Dilaudid  (sparingly) and IV tylenol .   Chronic osteomyelitis Chronic Presacral fluid collection with cultures + for E Coli ESBL and  Klebsiella Percutaneous drain - chronic presacral fluid collection Appreciate ID eval. Chronic colonization not clinically significant here. Abx stopped. - Trend WBCs and fevers   ESRD on HD Hyperkalemia L nephrostomy tube Nephro following, thank you. He refused HD on Wednesday night. K checks range 5.7 to 6.2. He is about to start iHD.   A-fib on Eliquis  dCHF Heparin  gtt and tele monitoring.   Anemia of chronic disease At baseline.   Moderate pericardial effusion Elevated Troponin: likely demand ischemia Cardiology following, thank you. Currently no indication for pericardiocentesis. Plan a repeat ECHO today.   Hx of CVA -hold statin while npo   Hx of copd Denies baseline O2 requirement. on dulera  -wean [place] for sats 88-92% -breo-ellipta; prn duoneb   Depression -hold home zoloft    Peripheral neuropathy -hold gaba while npo  Best Practice (right click and Reselect all SmartList Selections daily)   Diet/type: NPO DVT prophylaxis systemic heparin  Pressure ulcer(s): pressure ulcer assessment deferred  GI prophylaxis: N/A Lines: N/A Foley:  N/A Code Status:  full code Last date of multidisciplinary goals of care discussion [7/22 patient updated at bedside]  Labs   CBC: Recent Labs  Lab 01/25/24 1854 01/26/24 0944 01/27/24 0435 01/28/24 0349 01/29/24 0059  WBC 11.0* 8.6 7.5 9.8 7.8  NEUTROABS 9.6*  --   --   --   --   HGB 10.2* 8.8* 8.8* 9.9* 9.7*  HCT 33.2* 29.1* 29.4* 32.3* 32.0*  MCV 119.0* 119.8* 121.5* 118.8* 119.0*  PLT 233 156 145* 263 229    Basic Metabolic Panel: Recent Labs  Lab 01/25/24 1854 01/26/24 0944 01/26/24 2116 01/27/24 1013 01/28/24 0349 01/28/24 1241 01/28/24 1500 01/29/24 0059  NA 142  --   --  139 134*  --   --  131*  K 4.1  --   --  5.4* 5.4* 5.9* 6.2* 5.7*  CL 98  --   --  101 99  --   --  94*  CO2 26  --   --  20* 21*  --   --  18*  GLUCOSE 154*  --   --  131* 192*  --   --  154*  BUN 33*  --   --  54* 69*  --   --   82*  CREATININE 6.02* 6.30*  --  7.50* 8.13*  --   --  8.67*  CALCIUM  9.8  --   --  8.7* 8.8*  --   --  7.9*  MG  --   --  1.9 1.9  --   --   --   --    GFR: Estimated Creatinine Clearance: 6 mL/min (A) (by C-G formula based on SCr of 8.67 mg/dL (H)). Recent Labs  Lab 01/25/24 2101 01/26/24 0944 01/26/24 1650 01/26/24 1926 01/26/24 2116 01/27/24 0435 01/28/24 0349 01/29/24 0059  PROCALCITON  --  2.30  --   --   --   --   --   --   WBC  --  8.6  --   --   --  7.5 9.8 7.8  LATICACIDVEN 1.6  --  1.6 1.2 1.5  --   --   --    Liver Function Tests: Recent Labs  Lab 01/25/24 1854 01/27/24 1013  AST 28 19  ALT 27 16  ALKPHOS 144* 83  BILITOT 0.7 1.0  PROT 7.9 6.4*  ALBUMIN  4.4 3.1*   No results for input(s): LIPASE, AMYLASE in the last 168 hours. No results for input(s): AMMONIA in the last 168 hours.  ABG    Component Value Date/Time   TCO2 25 05/06/2022 0621    Coagulation Profile: Recent Labs  Lab 01/25/24 1854  INR 1.3*   Cardiac Enzymes: No results for input(s): CKTOTAL, CKMB, CKMBINDEX, TROPONINI in the last 168 hours.  HbA1C: Hgb A1c MFr Bld  Date/Time Value Ref Range Status  11/09/2023 09:52 PM 5.9 (H) 4.8 - 5.6 % Final    Comment:    (NOTE) Pre diabetes:          5.7%-6.4%  Diabetes:              >6.4%  Glycemic control for   <7.0% adults with diabetes    CBG: Recent Labs  Lab 01/28/24 1125 01/28/24 1524 01/28/24 1938 01/28/24 2345 01/29/24 0350  GLUCAP 159* 167* 149* 164* 147*   Review of Systems:   - for abdominal pain, nausea, emesis, lightheadedness, fevers, chills  Past Medical History:  He,  has a past medical history of A-fib (HCC), Anemia, Arthritis, Cancer (HCC), COVID-19, Dysrhythmia, ESRD (end stage renal disease) (HCC) (10/22/2021), Glaucoma (11/18/2021), History of DVT (deep vein thrombosis), Hydronephrosis, Idiopathic neuropathy (10/22/2021), Ileostomy in place [facility]), Obstructive uropathy, Old retinal  detachment, total or subtotal, Orthostatic hypotension (10/22/2021), Sleep apnea, Stroke (HCC), Ulcerative colitis (HCC), and Ureteral stricture.   Surgical History:   Past Surgical History:  Procedure Laterality Date   BASAL CELL CARCINOMA EXCISION     10/23   COLON SURGERY     creation of j pouch     and subsequent takedown of j pouch   EYE SURGERY     IR CATHETER TUBE CHANGE  12/18/2023   IR NEPHROSTOMY EXCHANGE LEFT  12/10/2021   IR NEPHROSTOMY EXCHANGE LEFT  04/22/2022   IR NEPHROSTOMY EXCHANGE LEFT  07/29/2022   IR NEPHROSTOMY EXCHANGE LEFT  10/28/2022   IR NEPHROSTOMY EXCHANGE LEFT  02/05/2023   IR NEPHROSTOMY EXCHANGE LEFT  05/07/2023   IR NEPHROSTOMY EXCHANGE LEFT  06/25/2023   IR NEPHROSTOMY EXCHANGE LEFT  07/17/2023   IR NEPHROSTOMY EXCHANGE LEFT  10/22/2023   IR RADIOLOGIST EVAL & MGMT  11/25/2023   IR RADIOLOGIST EVAL & MGMT  12/18/2023   LEFT HEART CATH AND CORONARY ANGIOGRAPHY N/A 07/22/2022   Procedure: LEFT HEART CATH AND CORONARY ANGIOGRAPHY;  Surgeon: [place]az[place], Peter M, MD;  Location: [facility] INVASIVE CV LAB;  Service: Cardiovascular;  Laterality: N/A;   REVISION OF ARTERIOVENOUS GORETEX GRAFT Left 05/06/2022   Procedure: REDO LEFT THIGH ARTERIOVENOUS 4-7 MM GORETEX GRAFT;  Surgeon: Eliza Lonni RAMAN, MD;  Location: [facility] OR;  Service: Vascular;  Laterality: Left;   SMALL INTESTINE SURGERY     TOTAL COLECTOMY       Social History:   reports that he quit smoking about 40 years ago. His smoking use included cigarettes. He started smoking about 46 years ago. He has a 12 pack-year smoking history. He has never been exposed to tobacco smoke. He has never used smokeless tobacco. He reports current alcohol use of about 5.0 standard drinks of alcohol per week. He reports that he does not use drugs.   Family History:  His family history includes Cancer in his father; Esophageal cancer in his brother; Stroke in his mother.   Allergies Allergies  Allergen Reactions   Baclofen Other  (See Comments)  Altered mental status, after accidental overdose     Cephalosporins Rash   Ciprofloxacin Itching and Rash    Home Medications  Prior to Admission medications   Medication Sig Start Date End Date Taking? Authorizing Provider  albuterol  (PROVENTIL ) (2.5 MG/3ML) 0.083% nebulizer solution Take 2.5 mg by nebulization every 6 (six) hours as needed for wheezing or shortness of breath.    [provider]  albuterol  (VENTOLIN  HFA) 108 (90 Base) MCG/ACT inhaler Inhale 1-2 puffs into the lungs every 6 (six) hours as needed for wheezing or shortness of breath.    [provider]  atorvastatin  (LIPITOR) 20 MG tablet Take 1 tablet (20 mg total) by mouth daily. 11/14/23   Sebastian Toribio GAILS, MD  budesonide  (PULMICORT ) 0.5 MG/2ML nebulizer solution Take 0.5 mg by nebulization 2 (two) times daily as needed (for respiratory flares).    [provider]  calcitRIOL  (ROCALTROL ) 0.5 MCG capsule Take 1 capsule (0.5 mcg total) by mouth every Monday, Wednesday, and Friday with hemodialysis. 12/04/23   Danford, Lonni SQUIBB, MD  Cyanocobalamin (VITAMIN B-12 PO) Take 1 capsule by mouth in the morning and at bedtime.    [provider]  dorzolamide  (TRUSOPT ) 2 % ophthalmic solution Place 1 drop into the right eye at bedtime. 06/17/23   [provider]  ELIQUIS  2.5 MG TABS tablet Take 1 tablet (2.5 mg total) by mouth 2 (two) times daily. 08/07/22   Hobart Powell BRAVO, MD  fludrocortisone  (FLORINEF ) 0.1 MG tablet Take 1 tablet (0.1 mg total) by mouth daily. 12/04/23   Danford, Lonni SQUIBB, MD  folic acid  (FOLVITE ) 1 MG tablet Take 1 tablet (1 mg total) by mouth daily. 07/21/23   Regalado, Belkys A, MD  gabapentin  (NEURONTIN ) 300 MG capsule Take 300 mg by mouth at bedtime. 09/09/23   [provider]  loperamide  (IMODIUM ) 2 MG capsule Take 1 capsule (2 mg total) by mouth as needed for diarrhea or loose stools. 11/13/23   Sebastian Toribio GAILS, MD  midodrine   (PROAMATINE ) 10 MG tablet Take 2 tablets (20 mg total) by mouth 3 (three) times daily with meals. 12/22/23   Katrinka Garnette KIDD, MD  mometasone -formoterol  (DULERA ) 200-5 MCG/ACT AERO Inhale 2 puffs into the lungs 2 (two) times daily. 09/08/23   Rosario Eland I, MD  multivitamin (RENA-VIT) TABS tablet Take 1 tablet by mouth daily.    [provider]  Omega Fatty Acids-Vitamins (OMEGA-3 GUMMIES) CHEW Chew 1 tablet by mouth in the morning and at bedtime.    [provider]  pantoprazole  (PROTONIX ) 40 MG tablet Take 1 tablet (40 mg total) by mouth 2 (two) times daily. 11/13/23   Sebastian Toribio GAILS, MD  ramelteon  (ROZEREM ) 8 MG tablet Take 1 tablet (8 mg total) by mouth at bedtime. 10/27/23   Katrinka Garnette KIDD, MD  sertraline  (ZOLOFT ) 50 MG tablet Take 25 mg by mouth at bedtime.    [provider]  sevelamer  carbonate (RENVELA ) 800 MG tablet Take 1,600 mg by mouth 3 (three) times daily with meals.    [provider]  Sodium Chloride  Flush (SALINE FLUSH) 0.9 % SOLN Use 5 mLs by Intracatheter route daily as directed. 11/25/23   Caperilla, Marissa N, PA  timolol  (TIMOPTIC ) 0.5 % ophthalmic solution Place 1 drop into both eyes in the morning and at bedtime.    [provider]  traMADol  (ULTRAM ) 50 MG tablet Take 1 tablet (50 mg total) by mouth every 8 (eight) hours as needed (for pain). 12/03/23   Danford,  Lonni SQUIBB, MD  TYLENOL  325 MG tablet Take 325-650 mg by mouth every 6 (six) hours as needed for mild pain (pain score 1-3) or headache.    [provider]    Critical care time: 35 mins    Lonni Africa, DO IM Resident PGY-2

## 2024-01-29 NOTE — Progress Notes (Signed)
 Echocardiogram 2D Echocardiogram has been performed.  Joshua Riley Adonis Ryther RDCS 01/29/2024, 9:45 AM

## 2024-01-29 NOTE — Progress Notes (Signed)
 [facility] Progress Note  Subjective:  Seen in ICU No c/o's, they might let him drink/ eat later today Levo gtt 8 mcg/ min  Vitals:   01/29/24 1000 01/29/24 1015 01/29/24 1030 01/29/24 1045  BP: 109/75 102/71 101/72 90/72  Pulse: 80 90 88 (!) 105  Resp: 18 18 16 18   Temp:      TempSrc:      SpO2: 100% 93% 91% 96%  Weight:      Height:        Exam: Gen alert, no distress, not on O2 No jvd or bruits Chest clear bilat to bases RRR no RG Abd soft ntnd no mass or ascites +bs, RLQ ostomy bag Ext trace pretib edema bilat Neuro is alert, Ox 3 , nf    L thigh AVG +bruit    Home bp meds: Florinef  0.1mg  every day Midodrine  20mg  tid    OP HD: NW MWF 2h  B350   71.5kg   2K bath   AVG   Heparin  none Rocaltrol  0.5 mcg po three times per week Mircera 225 mcg IV q 2 wks, last 7/21, due 8/04 Last OP HD 7/21, post wt 73.1kg     Assessment/ Plan: SBO: sp NG tube placement, per gen surg/ pmd.  Recurrent presacral fluid collection: w/ drain in place from prior admit, per surg Hypotension: acute on chronic, usual BP's at OP HD are in the 90s-100s. BP's here 80s- 90s w/ levo support. Resume home midodrine  when able to take pills.  Hyperkalemia: 5.7, rx w/ HD today ESRD: on HD MWF. HD today in progress.  Volume: 2 1/2kg up by wts, mild pretib edema, no resp issues Anemia of esrd: Hb 8.5- 10.5 here. Follow.  L percutaneous nephrostomy tube: exit site is clean, not tender         Myer Fret MD  [facility] 01/29/2024, 11:11 AM  Recent Labs  Lab 01/25/24 1854 01/26/24 0944 01/27/24 1013 01/28/24 0349 01/28/24 1241 01/28/24 1500 01/29/24 0059  HGB 10.2*   < >  --  9.9*  --   --  9.7*  ALBUMIN  4.4  --  3.1*  --   --   --   --   CALCIUM  9.8  --  8.7* 8.8*  --   --  7.9*  CREATININE 6.02*   < > 7.50* 8.13*  --   --  8.67*  K 4.1  --  5.4* 5.4*   < > 6.2* 5.7*   < > = values in this interval not displayed.   No results for input(s): IRON, TIBC, FERRITIN  in the last 168 hours. Inpatient medications:  Chlorhexidine  Gluconate Cloth  6 each Topical Daily   dorzolamide   1 drop Right Eye QHS   fluticasone  furoate-vilanterol  1 puff Inhalation Daily   hydrocortisone  sod succinate (SOLU-CORTEF ) inj  100 mg Intravenous TID   insulin  aspart  0-15 Units Subcutaneous Q4H   pantoprazole  (PROTONIX ) IV  40 mg Intravenous Q24H   timolol   1 drop Both Eyes BID    albumin  human 25 g (01/29/24 1047)   albumin  human 25 g (01/29/24 0907)   anticoagulant sodium citrate      heparin  1,300 Units/hr (01/29/24 0804)   norepinephrine  (LEVOPHED ) Adult infusion 8 mcg/min (01/29/24 0617)   albumin  human, albumin  human, alteplase , anticoagulant sodium citrate , feeding supplement (NEPRO CARB STEADY), heparin , HYDROmorphone  (DILAUDID ) injection, ipratropium-albuterol , lidocaine  (PF), lidocaine -prilocaine , ondansetron  (ZOFRAN ) IV, oxyCODONE , pentafluoroprop-tetrafluoroeth

## 2024-01-29 NOTE — Progress Notes (Signed)
 Subjective: CC: Seen with RN   Patient reports no abdominal pain or nausea. NGT with 50cc/24 hours. He is having ileostomy output - 600cc/24 hours.    He is currently afebrile without tachycardia. Still on 8mcg/min of levo with overall stable needs. Also on solu-cortef . WBC wnl.   Xray this AM with no dilated small bowel seen and NGT in the stomach.   Objective: Vital signs in last 24 hours: Temp:  [96.6 F (35.9 C)-98.6 F (37 C)] 97 F (36.1 C) (07/25 0848) Pulse Rate:  [66-102] 90 (07/25 1015) Resp:  [6-25] 18 (07/25 1015) BP: (76-113)/(58-95) 102/71 (07/25 1015) SpO2:  [80 %-100 %] 93 % (07/25 1015) Weight:  [74.8 kg-75 kg] 74.8 kg (07/25 0848) Last BM Date : 01/27/24  Intake/Output from previous day: 07/24 0701 - 07/25 0700 In: 1210 [I.V.:910; IV Piggyback:300] Out: 650 [Emesis/NG output:50; Stool:600] Intake/Output this shift: No intake/output data recorded.  PE: Gen:  Alert, NAD, pleasant HEENT: NGT in place on LIWS Abd: Soft, ND, NT today. Ileostomy bag with liquid stool in ostomy bag.   Lab Results:  Recent Labs    01/28/24 0349 01/29/24 0059  WBC 9.8 7.8  HGB 9.9* 9.7*  HCT 32.3* 32.0*  PLT 263 229   BMET Recent Labs    01/28/24 0349 01/28/24 1241 01/28/24 1500 01/29/24 0059  NA 134*  --   --  131*  K 5.4*   < > 6.2* 5.7*  CL 99  --   --  94*  CO2 21*  --   --  18*  GLUCOSE 192*  --   --  154*  BUN 69*  --   --  82*  CREATININE 8.13*  --   --  8.67*  CALCIUM  8.8*  --   --  7.9*   < > = values in this interval not displayed.   PT/INR No results for input(s): LABPROT, INR in the last 72 hours.  CMP     Component Value Date/Time   NA 131 (L) 01/29/2024 0059   NA 138 04/10/2023 1541   K 5.7 (H) 01/29/2024 0059   CL 94 (L) 01/29/2024 0059   CO2 18 (L) 01/29/2024 0059   GLUCOSE 154 (H) 01/29/2024 0059   BUN 82 (H) 01/29/2024 0059   BUN 19 04/10/2023 1541   CREATININE 8.67 (H) 01/29/2024 0059   CALCIUM  7.9 (L) 01/29/2024  0059   PROT 6.4 (L) 01/27/2024 1013   ALBUMIN  3.1 (L) 01/27/2024 1013   AST 19 01/27/2024 1013   ALT 16 01/27/2024 1013   ALKPHOS 83 01/27/2024 1013   BILITOT 1.0 01/27/2024 1013   GFRNONAA 6 (L) 01/29/2024 0059   Lipase     Component Value Date/Time   LIPASE 49 09/06/2023 1808    Studies/Results: DG Abd Portable 1V Result Date: 01/29/2024 CLINICAL DATA:  [id number].  Small-bowel obstruction. EXAM: PORTABLE ABDOMEN - 1 VIEW COMPARISON:  Portable abdomen film yesterday at 9:31 a.m. FINDINGS: 4:50 a.m. Again noted are a left percutaneous nephrostomy and a left-sided transgluteal pelvic pigtail drainage catheter, right-sided pelvic penile implant reservoir. There is an ostomy port again in the right mid abdomen. No dilated small bowel is seen, no supine evidence of free air. 2.6 cm rim calcified gallstone again noted right upper abdomen. No urinary stone is evident. NGT is in place with tip in the distal stomach. Cardiomegaly is partially visible. Osteopenia and degenerative change thoracic and lumbar spine. Overall bowel aeration unchanged since yesterday. IMPRESSION:  1. No dilated small bowel is seen, no supine evidence of free air. 2. NGT tip in the distal stomach. 3. Cholelithiasis. 4. Cardiomegaly. 5. Left percutaneous nephrostomy and left-sided transgluteal pelvic pigtail drainage catheter. Electronically Signed   By: Francis Quam M.D.   On: 01/29/2024 06:12   DG Abd Portable 1V Result Date: 01/28/2024 CLINICAL DATA:  Small-bowel obstruction, abdominal pain EXAM: PORTABLE ABDOMEN - 1 VIEW COMPARISON:  01/27/2024 FINDINGS: Left percutaneous nephrostomy and left pelvic transgluteal pigtail drainage catheter are unchanged from prior CT examination 01/25/2024. Penile prosthesis in place with reservoir noted within the right hemipelvis. Ostomy appliance noted within the right mid abdomen. Normal abdominal gas pattern. No gross free intraperitoneal gas. No nephro or urolithiasis. No acute bone  abnormality. IMPRESSION: 1. Nonobstructive bowel gas pattern. 2. Left percutaneous nephrostomy and left pelvic transgluteal drainage catheter in place. Electronically Signed   By: Dorethia Molt M.D.   On: 01/28/2024 13:24   VAS US  LOWER EXTREMITY VENOUS (DVT) Result Date: 01/28/2024  Lower Venous DVT Study Patient Name:  SULTAN PARGAS  Date of Exam:   01/27/2024 Medical Rec #: [id number]        Accession #:    [id number] Date of Birth: 1935/10/25        Patient Gender: M Patient Age:   88 years Exam Location:  [facility] Procedure:      VAS US  LOWER EXTREMITY VENOUS (DVT) Referring Phys: NORLEEN PAYNE --------------------------------------------------------------------------------  Indications: Swelling, and Edema.  Limitations: Bandages and urine bag on left thigh. Comparison Study: No prior Performing Technologist: Lataco Crump,RVT  Examination Guidelines: A complete evaluation includes B-mode imaging, spectral Doppler, color Doppler, and power Doppler as needed of all accessible portions of each vessel. Bilateral testing is considered an integral part of a complete examination. Limited examinations for reoccurring indications may be performed as noted. The reflux portion of the exam is performed with the patient in reverse Trendelenburg.  +---------+---------------+---------+-----------+----------+--------------+ RIGHT    CompressibilityPhasicitySpontaneityPropertiesThrombus Aging +---------+---------------+---------+-----------+----------+--------------+ CFV      Full           Yes      Yes                                 +---------+---------------+---------+-----------+----------+--------------+ SFJ      Full                                                        +---------+---------------+---------+-----------+----------+--------------+ FV Prox  Full                                                         +---------+---------------+---------+-----------+----------+--------------+ FV Mid   Full                                                        +---------+---------------+---------+-----------+----------+--------------+ FV DistalFull                                                        +---------+---------------+---------+-----------+----------+--------------+  PFV      Full                                                        +---------+---------------+---------+-----------+----------+--------------+ POP      Full           Yes      Yes                                 +---------+---------------+---------+-----------+----------+--------------+ PTV      Full                                                        +---------+---------------+---------+-----------+----------+--------------+ PERO     Full                                                        +---------+---------------+---------+-----------+----------+--------------+   +---------+---------------+---------+-----------+----------+-------------------+ LEFT     CompressibilityPhasicitySpontaneityPropertiesThrombus Aging      +---------+---------------+---------+-----------+----------+-------------------+ CFV                                                   bandage             +---------+---------------+---------+-----------+----------+-------------------+ SFJ                                                   bandage             +---------+---------------+---------+-----------+----------+-------------------+ FV Prox  Full                                                             +---------+---------------+---------+-----------+----------+-------------------+ FV Mid   Full                                                             +---------+---------------+---------+-----------+----------+-------------------+ FV DistalFull                                         Thckened  walls      +---------+---------------+---------+-----------+----------+-------------------+ PFV  Not well visualized +---------+---------------+---------+-----------+----------+-------------------+ POP      Full                                         thickened walls     +---------+---------------+---------+-----------+----------+-------------------+ PTV      Full                                                             +---------+---------------+---------+-----------+----------+-------------------+ PERO                                                  Not well visualized +---------+---------------+---------+-----------+----------+-------------------+     Summary: RIGHT: - There is no evidence of deep vein thrombosis in the lower extremity.  - No cystic structure found in the popliteal fossa.  LEFT: - There is no evidence of deep vein thrombosis in the lower extremity. However, portions of this examination were limited- see technologist comments above.  - No cystic structure found in the popliteal fossa.  *See table(s) above for measurements and observations. Electronically signed by Lonni Gaskins MD on 01/28/2024 at 8:29:23 AM.    Final     Anti-infectives: Anti-infectives (From admission, onward)    Start     Dose/Rate Route Frequency Ordered Stop   01/26/24 2200  metroNIDAZOLE  (FLAGYL ) IVPB 500 mg  Status:  Discontinued        500 mg 100 mL/hr over 60 Minutes Intravenous Every 12 hours 01/26/24 2143 01/27/24 1124   01/26/24 2015  vancomycin  (VANCOREADY) IVPB 500 mg/100 mL        500 mg 100 mL/hr over 60 Minutes Intravenous  Once 01/26/24 1926 01/27/24 0000   01/26/24 2015  aztreonam  (AZACTAM ) 2 g in sodium chloride  0.9 % 100 mL IVPB  Status:  Discontinued        2 g 200 mL/hr over 30 Minutes Intravenous Every 24 hours 01/26/24 1926 01/27/24 1124   01/26/24 1926  vancomycin  variable dose per unstable renal function  (pharmacist dosing)  Status:  Discontinued         Does not apply See admin instructions 01/26/24 1926 01/27/24 1124   01/25/24 2000  aztreonam  (AZACTAM ) 2 g in sodium chloride  0.9 % 100 mL IVPB        2 g 200 mL/hr over 30 Minutes Intravenous  Once 01/25/24 1958 01/25/24 2101   01/25/24 2000  metroNIDAZOLE  (FLAGYL ) IVPB 500 mg        500 mg 100 mL/hr over 60 Minutes Intravenous  Once 01/25/24 1958 01/25/24 2235   01/25/24 2000  vancomycin  (VANCOCIN ) IVPB 1000 mg/200 mL premix        1,000 mg 200 mL/hr over 60 Minutes Intravenous  Once 01/25/24 1958 01/26/24 0019        Assessment/Plan SBO - CT scan showed a small bowel obstruction with a transition in the pelvis. 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 He has a history  of UC s/p TAC and ileostomy creation. He has had multiple SBO's in the past -  No current indication for emergency surgery. I do not think his acute on chronic low BP is coming from anything emergent in his abdomen. He is afebrile, wbc wnl, pain resolved, NT on exam and has started to have some bowel function.  - Repeat NGT clamping trial and CLD today. Possible NGT removal this afternoon if tolerating clamping trial.  - Hopefully patient will improve with conservative management. If patient fails to improve with conservative management, they may require exploratory surgery during admission - We will follow with you.  Recurrent presacral fluid collection with drain in place - CT with Left trans gluteal percutaneous drainage catheter with pigtail inthe presacral region at the site of prior fluid collection. Residual soft tissue thickening in the region with small residual fluid collection inferior to the pigtail measuring about 14 x 16 mm. IR has been following this as an outpatient.   FEN - NGT clamped. CLD. IVF per primary.  Electrolytes per neprhology VTE - SCDs, hold Eliquis , heparin  gtt.  ID - None per ID  - Per primary -  Acute on Chronic hypotension - on Midodrine  and florinef  at baseline. Currently on low dose levo.  Hx pericarditis with evidence of moderate pericardial effusion on imaging this admission and elevated TN's - cardiology following. They plan repeat echo 7/25 ESRD on HD MWF dCHF A-fib on Eliquis  - please hold eliquis  Hx ulcerative colitis s/p TAC and ileostomy  CVA COPD Chronic sacral osteomyelitis (recently completed ertapenem  and vanc) Chronic hypotension on Midodrine  and florinef    I reviewed nursing notes, hospitalist notes, last 24 h vitals and pain scores, last 48 h intake and output, last 24 h labs and trends, and last 24 h imaging results.   LOS: 3 days    Ozell CHRISTELLA Shaper, [facility] Surgery 01/29/2024, 10:23 AM Please see Amion for pager number during day hours 7:00am-4:30pm

## 2024-01-29 NOTE — Plan of Care (Signed)

## 2024-01-29 NOTE — Progress Notes (Signed)
 Brief cardiology note:  Repeat limited echo shows significant reduction in size of pericardial effusion. No tamponade. Cardiology will sign off, please call with questions.  Shelda Bruckner, MD, PhD, [facility]  [place]  [facility] at [facility] [address] [place], [place] [address] [phone]

## 2024-01-29 NOTE — Progress Notes (Incomplete)
 Attending note: I have seen and examined the patient. History, labs and imaging reviewed.  Remains on levophed    Blood pressure 98/78, pulse (!) 104, temperature (!) 97 F (36.1 C), resp. rate 20, height 5' 8 (1.727 m), weight 74.8 kg, SpO2 (!) 86%. Gen:      No acute distress HEENT:  EOMI, sclera anicteric Neck:     No masses; no thyromegaly Lungs:    Clear to auscultation bilaterally; normal respiratory effort*** CV:         Regular rate and rhythm; no murmurs Abd:     Ostomy Ext:    No edema; adequate peripheral perfusion Neuro: alert and oriented x 3 Psych: normal mood and affect   Labs/Imaging personally reviewed, significant for   Assessment/plan:  Repeat NG clamping trial today Resume midodine when able to PO cosistently Wean levo as tolerated  ESRD on HD HD today  Pericaridal effusion without tamponade Repeat echo today   The patient is critically ill with multiple organ systems failure and requires high complexity decision making for assessment and support, frequent evaluation and titration of therapies, application of advanced monitoring technologies and extensive interpretation of multiple databases.  Critical care time - 35 mins. This represents my time independent of the NPs time taking care of the pt.  Stevon Gough MD [place] [facility] 01/29/2024, 11:37 AM

## 2024-01-29 NOTE — Progress Notes (Signed)
   01/29/24 1210  Vitals  Temp (!) 96.2 F (35.7 C)  Pulse Rate 96  Resp (!) 21  BP 101/81  SpO2 92 %  O2 Device Nasal Cannula  Type of Weight Post-Dialysis  Oxygen Therapy  Patient Activity (if Appropriate) In bed  Pulse Oximetry Type Continuous  Post Treatment  Dialyzer Clearance Clear  Hemodialysis Intake (mL) 200 mL  Liters Processed 57.5  Fluid Removed (mL) 1500 mL  Tolerated HD Treatment Yes  AVG/AVF Arterial Site Held (minutes) 15 minutes  AVG/AVF Venous Site Held (minutes) 10 minutes   Received patient in bed to unit.  Alert and oriented.  Informed consent signed and in chart.    TX duration:2 hrs   Patient tolerated well.  Transported back to the room  Alert, without acute distress.  Hand-off given to patient's nurse.    Access used: AVG Access issues:  none   Total UF removed: Medication(s) given: see eMAR  Woodfin Dolores RN [facility]

## 2024-01-29 NOTE — Progress Notes (Signed)
 PHARMACY - ANTICOAGULATION CONSULT NOTE  Pharmacy Consult for heparin  Indication: AF  Allergies  Allergen Reactions   Baclofen Other (See Comments)    Altered mental status, after accidental overdose     Cephalosporins Rash   Ciprofloxacin Itching and Rash    Patient Measurements: Height: 5' 8 (172.7 cm) Weight: 74.8 kg (164 lb 14.5 oz) IBW/kg (Calculated) : 68.4 HEPARIN  DW (KG): 74.2  Vital Signs: Temp: 97.5 F (36.4 C) (07/25 1506) Temp Source: Axillary (07/25 1506) BP: 101/81 (07/25 1210) Pulse Rate: 96 (07/25 1210)  Labs: Recent Labs    01/26/24 1926 01/26/24 2116 01/27/24 0024 01/27/24 0435 01/27/24 1013 01/28/24 0349 01/29/24 0059 01/29/24 1515  HGB  --   --    < > 8.8*  --  9.9* 9.7*  --   HCT  --   --   --  29.4*  --  32.3* 32.0*  --   PLT  --   --   --  145*  --  263 229  --   APTT  --   --    < >  --  78* 75* 68* 56*  HEPARINUNFRC  --   --    < >  --   --  0.87* 0.67 0.52  CREATININE  --   --   --   --  7.50* 8.13* 8.67*  --   TROPONINIHS 24* 23*  --   --   --   --   --   --    < > = values in this interval not displayed.    Estimated Creatinine Clearance: 6 mL/min (A) (by C-G formula based on SCr of 8.67 mg/dL (H)).  Assessment: 88 yo male with SBO. He has a history of afib on apixaban  at home (timing of last dose unknown, PTA)  HL 0.67 - downtrending in setting of recent DOAC aPTT 68 - therapeutic  PM update: aPTT decreased to 56 (subtherapeutic) despite heparin  rate increase to 1300 units/hr. No issues with the infusion or bleeding reported. Heparin  level 0.52 - trending down but still not correlating with aPTT.  Goal of Therapy:  Heparin  level 0.3-0.7 units/ml aPTT 66-102 seconds Monitor platelets by anticoagulation protocol: Yes   Plan:  - Increase heparin  drip to 1450 units/hr  - Recheck levels in 8hrs  - Daily aPTT levels ; daily heparin  levels until correlated  - Follow up with signs and symptoms of bleeding and transition to PO  anticoagulant as able.  Rocky Slade, PharmD, BCPS 01/29/2024 3:57 PM  Please check AMION for all [facility] Pharmacy phone numbers After 10:00 PM, call Main Pharmacy [phone]

## 2024-01-29 NOTE — Progress Notes (Signed)
 PHARMACY - ANTICOAGULATION CONSULT NOTE  Pharmacy Consult for heparin  Indication: AF  Allergies  Allergen Reactions   Baclofen Other (See Comments)    Altered mental status, after accidental overdose     Cephalosporins Rash   Ciprofloxacin Itching and Rash    Patient Measurements: Height: 5' 8 (172.7 cm) Weight: 75 kg (165 lb 5.5 oz) IBW/kg (Calculated) : 68.4 HEPARIN  DW (KG): 74.2  Vital Signs: Temp: 97.6 F (36.4 C) (07/25 0600) Temp Source: Oral (07/25 0600) BP: 102/78 (07/25 0600) Pulse Rate: 100 (07/25 0500)  Labs: Recent Labs    01/26/24 0944 01/26/24 1926 01/26/24 2116 01/27/24 0024 01/27/24 0435 01/27/24 1013 01/28/24 0349 01/29/24 0059  HGB  --   --   --   --  8.8*  --  9.9* 9.7*  HCT  --   --   --   --  29.4*  --  32.3* 32.0*  PLT  --   --   --   --  145*  --  263 229  APTT   < >  --   --  58*  --  78* 75* 68*  HEPARINUNFRC  --   --   --  0.90*  --   --  0.87* 0.67  CREATININE  --   --   --   --   --  7.50* 8.13* 8.67*  TROPONINIHS  --  24* 23*  --   --   --   --   --    < > = values in this interval not displayed.    Estimated Creatinine Clearance: 6 mL/min (A) (by C-G formula based on SCr of 8.67 mg/dL (H)).  Assessment: 88 yo male with SBO. He has a history of afib on apixaban  at home (timing of last dose unknown, PTA)  HL 0.67 - downtrending in setting of recent DOAC aPTT 68 - therapeutic  Goal of Therapy:  Heparin  level 0.3-0.7 units/ml aPTT 66-102 seconds Monitor platelets by anticoagulation protocol: Yes   Plan:  - Increase heparin  drip slightly to 1300 units/hr to maintain therapeutic aPTT (borderline this AM)  - Daily aPTT levels ; daily heparin  levels until correlated  - Follow up with signs and symptoms of bleeding and transition to PO anticoagulant as able.  Sharyne Glatter, PharmD, BCCCP Critical Care Clinical Pharmacist 01/29/2024 7:27 AM

## 2024-01-30 DIAGNOSIS — K56609 Unspecified intestinal obstruction, unspecified as to partial versus complete obstruction: Secondary | ICD-10-CM | POA: Diagnosis not present

## 2024-01-30 DIAGNOSIS — N186 End stage renal disease: Secondary | ICD-10-CM | POA: Diagnosis not present

## 2024-01-30 DIAGNOSIS — Z992 Dependence on renal dialysis: Secondary | ICD-10-CM | POA: Diagnosis not present

## 2024-01-30 DIAGNOSIS — I959 Hypotension, unspecified: Secondary | ICD-10-CM | POA: Diagnosis not present

## 2024-01-30 LAB — BASIC METABOLIC PANEL WITH GFR
Anion gap: 16 — ABNORMAL HIGH (ref 5–15)
BUN: 50 mg/dL — ABNORMAL HIGH (ref 8–23)
CO2: 24 mmol/L (ref 22–32)
Calcium: 8.4 mg/dL — ABNORMAL LOW (ref 8.9–10.3)
Chloride: 89 mmol/L — ABNORMAL LOW (ref 98–111)
Creatinine, Ser: 6.16 mg/dL — ABNORMAL HIGH (ref 0.61–1.24)
GFR, Estimated: 8 mL/min — ABNORMAL LOW (ref >60)
Glucose, Bld: 128 mg/dL — ABNORMAL HIGH (ref 70–99)
Potassium: 4 mmol/L (ref 3.5–5.1)
Sodium: 129 mmol/L — ABNORMAL LOW (ref 135–145)

## 2024-01-30 LAB — GLUCOSE, CAPILLARY
Glucose-Capillary: 105 mg/dL — ABNORMAL HIGH (ref 70–99)
Glucose-Capillary: 114 mg/dL — ABNORMAL HIGH (ref 70–99)
Glucose-Capillary: 133 mg/dL — ABNORMAL HIGH (ref 70–99)
Glucose-Capillary: 159 mg/dL — ABNORMAL HIGH (ref 70–99)
Glucose-Capillary: 202 mg/dL — ABNORMAL HIGH (ref 70–99)
Glucose-Capillary: 49 mg/dL — ABNORMAL LOW (ref 70–99)
Glucose-Capillary: 89 mg/dL (ref 70–99)

## 2024-01-30 LAB — CBC
HCT: 28.3 % — ABNORMAL LOW (ref 39.0–52.0)
Hemoglobin: 8.9 g/dL — ABNORMAL LOW (ref 13.0–17.0)
MCH: 36.5 pg — ABNORMAL HIGH (ref 26.0–34.0)
MCHC: 31.4 g/dL (ref 30.0–36.0)
MCV: 116 fL — ABNORMAL HIGH (ref 80.0–100.0)
Platelets: 189 K/uL (ref 150–400)
RBC: 2.44 MIL/uL — ABNORMAL LOW (ref 4.22–5.81)
RDW: 18.6 % — ABNORMAL HIGH (ref 11.5–15.5)
WBC: 8.3 K/uL (ref 4.0–10.5)
nRBC: 1.6 % — ABNORMAL HIGH (ref 0.0–0.2)

## 2024-01-30 LAB — APTT: aPTT: 90 s — ABNORMAL HIGH (ref 24–36)

## 2024-01-30 LAB — HEPARIN LEVEL (UNFRACTIONATED)
Heparin Unfractionated: 0.47 [IU]/mL (ref 0.30–0.70)
Heparin Unfractionated: 0.69 [IU]/mL (ref 0.30–0.70)

## 2024-01-30 MED ORDER — FLUDROCORTISONE ACETATE 0.1 MG PO TABS
0.1000 mg | ORAL_TABLET | Freq: Every day | ORAL | Status: DC
  Administered 2024-01-31 – 2024-02-03 (×4): 0.1 mg via ORAL
  Filled 2024-01-30 (×4): qty 1

## 2024-01-30 MED ORDER — HYDROCORTISONE SOD SUC (PF) 100 MG IJ SOLR
100.0000 mg | Freq: Once | INTRAMUSCULAR | Status: AC
  Administered 2024-01-30: 100 mg via INTRAVENOUS
  Filled 2024-01-30: qty 2

## 2024-01-30 MED ORDER — ORAL CARE MOUTH RINSE: 15.0000 mL | OROMUCOSAL | Status: DC | PRN

## 2024-01-30 MED ORDER — ORAL CARE MOUTH RINSE: 15.0000 mL | OROMUCOSAL | Status: DC

## 2024-01-30 MED ORDER — APIXABAN 2.5 MG PO TABS
2.5000 mg | ORAL_TABLET | Freq: Two times a day (BID) | ORAL | Status: DC
  Administered 2024-01-30 – 2024-02-03 (×9): 2.5 mg via ORAL
  Filled 2024-01-30 (×9): qty 1

## 2024-01-30 NOTE — Progress Notes (Signed)
 PHARMACY - ANTICOAGULATION CONSULT NOTE  Pharmacy Consult for heparin  Indication: Atrial fibrillation   Allergies  Allergen Reactions   Baclofen Other (See Comments)    Altered mental status, after accidental overdose     Cephalosporins Rash   Ciprofloxacin Itching and Rash    Patient Measurements: Height: 5' 8 (172.7 cm) Weight: 74.8 kg (164 lb 14.5 oz) IBW/kg (Calculated) : 68.4 HEPARIN  DW (KG): 74.2  Vital Signs: Temp: 97.6 F (36.4 C) (07/26 0006) Temp Source: Oral (07/26 0006) BP: 100/79 (07/26 0049) Pulse Rate: 82 (07/26 0030)  Labs: Recent Labs    01/28/24 0349 01/29/24 0059 01/29/24 1515 01/30/24 0236  HGB 9.9* 9.7*  --  8.9*  HCT 32.3* 32.0*  --  28.3*  PLT 263 229  --  189  APTT 75* 68* 56* 90*  HEPARINUNFRC 0.87* 0.67 0.52 0.47  CREATININE 8.13* 8.67*  --  6.16*    Estimated Creatinine Clearance: 8.5 mL/min (A) (by C-G formula based on SCr of 6.16 mg/dL (H)).  Assessment: 88 yo male with SBO. He has a history of afib on apixaban  at home (timing of last dose unknown, PTA)  HL 0.67 - downtrending in setting of recent DOAC aPTT 68 - therapeutic  PM update: aPTT decreased to 56 (subtherapeutic) despite heparin  rate increase to 1300 units/hr. No issues with the infusion or bleeding reported. Heparin  level 0.52 - trending down but still not correlating with aPTT.  7/26 AM update:  Heparin  level therapeutic Heparin  level and aPTT correlating--will DC further aPTT's  Goal of Therapy:  Heparin  level 0.3-0.7 units/ml aPTT 66-102 seconds Monitor platelets by anticoagulation protocol: Yes   Plan:  - Cont heparin  1450 units/hr - Heparin  level in 8 hours - Follow up with signs and symptoms of bleeding and transition to PO anticoagulant as able.  Lynwood Mckusick, PharmD, BCPS Clinical Pharmacist Phone: [phone]

## 2024-01-30 NOTE — Progress Notes (Signed)
 [facility] Progress Note  Subjective:  Seen in ICU Looks better, ostomy is putting out now Abd pain is better Levo weaned off this am  Vitals:   01/30/24 1115 01/30/24 1130 01/30/24 1140 01/30/24 1200  BP: 103/60 (!) 87/65    Pulse:  81 74 65  Resp: 13 (!) 33 16 17  Temp:   98 F (36.7 C)   TempSrc:   Oral   SpO2:    99%  Weight:      Height:        Exam: Gen alert, no distress, not on O2 No jvd or bruits Chest clear bilat to bases RRR no RG Abd soft ntnd no mass or ascites +bs, RLQ ostomy bag Ext trace pretib edema bilat Neuro is alert, Ox 3 , nf    L thigh AVG +bruit    Home bp meds: Florinef  0.1mg  every day Midodrine  20mg  tid    OP HD: NW MWF 2h  B350   71.5kg   2K bath   AVG   Heparin  none Rocaltrol  0.5 mcg po three times per week Mircera 225 mcg IV q 2 wks, last 7/21, due 8/04 Last OP HD 7/21, post wt 73.1kg     Assessment/ Plan: SBO: sp NG tube placement, per gen surg/ pmd. Improving, taking po's and ostomy output has increased.  Recurrent presacral fluid collection: w/ drain in place from prior admit, per surg Hypotension: acute on chronic, usual BP's at OP HD are in the 90s-100s. Home midodrine  20mg  tid was resumed as well as florinef .  ESRD: on HD MWF. HD Monday if still here.  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 Follow.  L percutaneous nephrostomy tube: exit site is clean, not tender         Myer Fret MD  [facility] 01/30/2024, 2:47 PM  Recent Labs  Lab 01/25/24 1854 01/26/24 0944 01/27/24 1013 01/28/24 0349 01/29/24 0059 01/30/24 0236  HGB 10.2*   < >  --    < > 9.7* 8.9*  ALBUMIN  4.4  --  3.1*  --   --   --   CALCIUM  9.8  --  8.7*   < > 7.9* 8.4*  CREATININE 6.02*   < > 7.50*   < > 8.67* 6.16*  K 4.1  --  5.4*   < > 5.7* 4.0   < > = values in this interval not displayed.   No results for input(s): IRON, TIBC, FERRITIN in the last 168 hours. Inpatient  medications:  apixaban   2.5 mg Oral BID   Chlorhexidine  Gluconate Cloth  6 each Topical Daily   dorzolamide   1 drop Right Eye QHS   [START ON 01/31/2024] fludrocortisone   0.1 mg Oral Daily   fluticasone  furoate-vilanterol  1 puff Inhalation Daily   hydrocortisone  sod succinate (SOLU-CORTEF ) inj  100 mg Intravenous Once   insulin  aspart  0-15 Units Subcutaneous Q4H   midodrine   20 mg Oral TID   pantoprazole  (PROTONIX ) IV  40 mg Intravenous Q24H   timolol   1 drop Both Eyes BID    albumin  human 60 mL/hr at 01/30/24 1300   albumin  human 60 mL/hr at 01/30/24 1300   anticoagulant sodium citrate      norepinephrine  (LEVOPHED ) Adult infusion Stopped (01/30/24 1144)   albumin  human, albumin  human, alteplase , anticoagulant sodium citrate , feeding supplement (NEPRO CARB STEADY), heparin , HYDROmorphone  (DILAUDID ) injection, ipratropium-albuterol , lidocaine  (PF), lidocaine -prilocaine , ondansetron  (ZOFRAN ) IV, mouth  rinse, pentafluoroprop-tetrafluoroeth, ramelteon 

## 2024-01-30 NOTE — Progress Notes (Addendum)
 PHARMACY - ANTICOAGULATION CONSULT NOTE  Pharmacy Consult for heparin  Indication: Atrial fibrillation   Allergies  Allergen Reactions   Baclofen Other (See Comments)    Altered mental status, after accidental overdose     Cephalosporins Rash   Ciprofloxacin Itching and Rash    Patient Measurements: Height: 5' 8 (172.7 cm) Weight: 72.3 kg (159 lb 6.3 oz) IBW/kg (Calculated) : 68.4 HEPARIN  DW (KG): 74.2  Vital Signs: Temp: 98 F (36.7 C) (07/26 1140) Temp Source: Oral (07/26 1140) BP: 87/65 (07/26 1130) Pulse Rate: 65 (07/26 1200)  Labs: Recent Labs    01/28/24 0349 01/29/24 0059 01/29/24 1515 01/30/24 0236 01/30/24 1218  HGB 9.9* 9.7*  --  8.9*  --   HCT 32.3* 32.0*  --  28.3*  --   PLT 263 229  --  189  --   APTT 75* 68* 56* 90*  --   HEPARINUNFRC 0.87* 0.67 0.52 0.47 0.69  CREATININE 8.13* 8.67*  --  6.16*  --     Estimated Creatinine Clearance: 8.5 mL/min (A) (by C-G formula based on SCr of 6.16 mg/dL (H)).  Assessment: 88 yo male with SBO. He has a history of afib on apixaban  at home (timing of last dose unknown, PTA) Heparin  level therapeutic at 0.69 (now correlating with aPTT)  Goal of Therapy:  Heparin  level 0.3-0.7 units/ml Monitor platelets by anticoagulation protocol: Yes   Plan:  - Cont heparin  1450 units/hr - Daily heparin  level  - Follow up with signs and symptoms of bleeding and transition to PO anticoagulant as able.  --------------------------------------------------  Addendum:  Will switch to home Eliquis  2.5 mg BID (home dose prior to admission) and stop heparin .   Vermell Mccallum, PharmD CCM Pharmacy Resident

## 2024-01-30 NOTE — Progress Notes (Signed)
 Assessment & Plan: SBO - NG out yesterday; tolerating clear liquid diet - small succus in ostomy bag this AM - abd soft, non-tender - advance to FLD this AM Recurrent presacral fluid collection with drain in place  - Left trans gluteal percutaneous drainage catheter with pigtail inthe presacral region at the site of prior fluid collection. Residual soft tissue thickening in the region with small residual fluid collection inferior to the pigtail measuring about 14 x 16 mm. IR has been following this as an outpatient.    FEN - FLD. IVF per primary. Electrolytes per neprhology VTE - SCDs, hold Eliquis , heparin  gtt.  ID - None per ID   - Per primary -  Acute on Chronic hypotension - on Midodrine  and florinef  at baseline. Currently on low dose levo.  Hx pericarditis with evidence of moderate pericardial effusion on imaging this admission and elevated TN's - cardiology following, echo 7/25 ESRD on HD MWF dCHF A-fib on Eliquis  - please hold eliquis  Hx ulcerative colitis s/p TAC and ileostomy  CVA COPD Chronic sacral osteomyelitis (recently completed ertapenem  and vanc) Chronic hypotension on Midodrine  and florinef          Krystal Spinner, MD [facility] practice Office: [phone]        Chief Complaint: SBO  Subjective: Patient in bed, comfortable, no complaints.  Tolerating CLD.  Objective: Vital signs in last 24 hours: Temp:  [96.2 F (35.7 C)-97.9 F (36.6 C)] 97.9 F (36.6 C) (07/26 0733) Pulse Rate:  [69-113] 87 (07/26 0630) Resp:  [9-26] 26 (07/26 0645) BP: (73-117)/(47-89) 91/76 (07/26 0645) SpO2:  [82 %-100 %] 98 % (07/26 0630) Weight:  [72.3 kg-74.8 kg] 72.3 kg (07/26 0500) Last BM Date : 01/29/24  Intake/Output from previous day: 07/25 0701 - 07/26 0700 In: 1851.9 [I.V.:777.9; IV Piggyback:1074] Out: 2210 [Drains:10; Stool:700] Intake/Output this shift: No intake/output data recorded.  Physical Exam: HEENT - sclerae clear,  mucous membranes moist Abdomen - soft without distension; small succus in ostomy; non-tender  Lab Results:  Recent Labs    01/29/24 0059 01/30/24 0236  WBC 7.8 8.3  HGB 9.7* 8.9*  HCT 32.0* 28.3*  PLT 229 189   BMET Recent Labs    01/29/24 0059 01/30/24 0236  NA 131* 129*  K 5.7* 4.0  CL 94* 89*  CO2 18* 24  GLUCOSE 154* 128*  BUN 82* 50*  CREATININE 8.67* 6.16*  CALCIUM  7.9* 8.4*   PT/INR No results for input(s): LABPROT, INR in the last 72 hours. Comprehensive Metabolic Panel:    Component Value Date/Time   NA 129 (L) 01/30/2024 0236   NA 131 (L) 01/29/2024 0059   NA 138 04/10/2023 1541   NA 139 07/17/2022 1034   K 4.0 01/30/2024 0236   K 5.7 (H) 01/29/2024 0059   CL 89 (L) 01/30/2024 0236   CL 94 (L) 01/29/2024 0059   CO2 24 01/30/2024 0236   CO2 18 (L) 01/29/2024 0059   BUN 50 (H) 01/30/2024 0236   BUN 82 (H) 01/29/2024 0059   BUN 19 04/10/2023 1541   BUN 37 (H) 07/17/2022 1034   CREATININE 6.16 (H) 01/30/2024 0236   CREATININE 8.67 (H) 01/29/2024 0059   GLUCOSE 128 (H) 01/30/2024 0236   GLUCOSE 154 (H) 01/29/2024 0059   CALCIUM  8.4 (L) 01/30/2024 0236   CALCIUM  7.9 (L) 01/29/2024 0059   AST 19 01/27/2024 1013   AST 28 01/25/2024 1854   ALT 16 01/27/2024 1013   ALT 27 01/25/2024  1854   ALKPHOS 83 01/27/2024 1013   ALKPHOS 144 (H) 01/25/2024 1854   BILITOT 1.0 01/27/2024 1013   BILITOT 0.7 01/25/2024 1854   PROT 6.4 (L) 01/27/2024 1013   PROT 7.9 01/25/2024 1854   ALBUMIN  3.1 (L) 01/27/2024 1013   ALBUMIN  4.4 01/25/2024 1854    Studies/Results: ECHOCARDIOGRAM LIMITED Result Date: 01/29/2024    ECHOCARDIOGRAM LIMITED REPORT   Patient Name:   Joshua Riley Date of Exam: 01/29/2024 Medical Rec #:  [id number]       Height:       68.0 in Accession #:    [id number]      Weight:       164.9 lb Date of Birth:  11-29-1935       BSA:          1.883 m Patient Age:    87 years        BP:           106/71 mmHg Patient Gender: M               HR:            87 bpm. Exam Location:  Inpatient Procedure: Limited Echo (Both Spectral and Color Flow Doppler were utilized            during procedure). Indications:    I31.3 Pericardial effusion (noninflammatory)  History:        Patient has prior history of Echocardiogram examinations, most                 recent 01/26/2024. COPD and ESRD, Arrythmias:Atrial Fibrillation;                 Risk Factors:Sleep Apnea.  Sonographer:    Damien Senior RDCS Referring Phys: [id number] BRIDGETTE CHRISTOPHER  Sonographer Comments: Limited to reassess effusion IMPRESSIONS  1. Effusion appears much smaller than prior. Epicardial fat visualized. There is no evidence of cardiac tamponade.  2. The inferior vena cava is dilated in size with <50% respiratory variability, suggesting right atrial pressure of 15 mmHg. FINDINGS  Left Ventricle: Pericardium: Effusion appears much smaller than prior. Epicardial fat visualized. Trivial pericardial effusion is present. There is no evidence of cardiac tamponade. Venous: The inferior vena cava is dilated in size with less than 50% respiratory variability, suggesting right atrial pressure of 15 mmHg. Morene Brownie Electronically signed by Morene Brownie Signature Date/Time: 01/29/2024/1:33:26 PM    Final    DG Abd Portable 1V Result Date: 01/29/2024 CLINICAL DATA:  [id number].  Small-bowel obstruction. EXAM: PORTABLE ABDOMEN - 1 VIEW COMPARISON:  Portable abdomen film yesterday at 9:31 a.m. FINDINGS: 4:50 a.m. Again noted are a left percutaneous nephrostomy and a left-sided transgluteal pelvic pigtail drainage catheter, right-sided pelvic penile implant reservoir. There is an ostomy port again in the right mid abdomen. No dilated small bowel is seen, no supine evidence of free air. 2.6 cm rim calcified gallstone again noted right upper abdomen. No urinary stone is evident. NGT is in place with tip in the distal stomach. Cardiomegaly is partially visible. Osteopenia and degenerative change thoracic and lumbar  spine. Overall bowel aeration unchanged since yesterday. IMPRESSION: 1. No dilated small bowel is seen, no supine evidence of free air. 2. NGT tip in the distal stomach. 3. Cholelithiasis. 4. Cardiomegaly. 5. Left percutaneous nephrostomy and left-sided transgluteal pelvic pigtail drainage catheter. Electronically Signed   By: Francis Quam M.D.   On: 01/29/2024 06:12   DG Abd Portable 1V Result Date: 01/28/2024 CLINICAL  DATA:  Small-bowel obstruction, abdominal pain EXAM: PORTABLE ABDOMEN - 1 VIEW COMPARISON:  01/27/2024 FINDINGS: Left percutaneous nephrostomy and left pelvic transgluteal pigtail drainage catheter are unchanged from prior CT examination 01/25/2024. Penile prosthesis in place with reservoir noted within the right hemipelvis. Ostomy appliance noted within the right mid abdomen. Normal abdominal gas pattern. No gross free intraperitoneal gas. No nephro or urolithiasis. No acute bone abnormality. IMPRESSION: 1. Nonobstructive bowel gas pattern. 2. Left percutaneous nephrostomy and left pelvic transgluteal drainage catheter in place. Electronically Signed   By: Dorethia Molt M.D.   On: 01/28/2024 13:24      Krystal Spinner 01/30/2024  Patient ID: Joshua Riley, male   DOB: 05/27/1936, 88 y.o.   MRN: [id number]

## 2024-01-30 NOTE — Progress Notes (Signed)
 NAME:  Joshua Riley, MRN:  [id number], DOB:  [birth year], LOS: 4 ADMISSION DATE:  01/25/2024, CONSULTATION DATE:  7/22 REFERRING MD:  Dr. Lee, CHIEF COMPLAINT:  hypotension; sbo   History of Present Illness:   Patient is a 88 year old male with pertinent PMH ESRD on HD MWF, dCHF, A-fib on Eliquis , ulcerative colitis w/ ileostomy, CVA, chronic sacral osteomyelitis (recently completed ertapenem  and vanc), orthostatic hypotension presents to [facility] on 7/22 with SBO.   Patient having abd on 7/21 that progressively worsened w/ N/V came to [facility] ED later that night. On arrival patient tachycardic and hypotensive. Patient afebrile and wbc 11.  Cultures obtained, given iv fluids, started on aztreonam  and vanc. LA 2.6 then 1.6. PCT 2.3. CXR no acute findings. Covid/flu/rsv negative. CT abd/pelvis w/ small parastomal hernia contains fat and a knuckle of small bowel; Interim development of moderate to marked fluid distension of the stomach with multiple dilated fluid-filled loops of proximal to mid small bowel and fecalized bowel in the pelvis consistent with small bowel obstruction; moderate pericardial effusion. Surgery consulted. NG tube placed and made npo. Trop 250. Echo lvef 60-65%; moderate pericardial effusion; RA pressure 15. Cards consulted. On 7/22 patient remained hypotensive despite iv fluids. Unable to give home midodrine /florinef  while npo. PCCM consulted.   Pertinent  Medical History   Adrenal insufficiency, chronic hypotension, Afib on Eliquis , Hx DVT, Hydronephrosis with pcn, peripheral neuropathy, Ileostomy, Ulcerative colitis, orthostatic hypotension  Significant Hospital Events: Including procedures, antibiotic start and stop dates in addition to other pertinent events   7/21 admit w/ sbo and hypotension.  Surgery consulted and recommended conservative management 7/22 remains hypotensive despite fluids; pccm consulted.  Started on peripheral pressors 7/25 repeat echo shows reduction in  size of pericardial effusion, no tamponade.  Cardiology signed off.  Okay to eat per surgery and restarted p.o. midodrine   Interim History / Subjective:   Remains on 3 mcg of norepinephrine   Objective    Blood pressure 91/76, pulse 87, temperature 97.9 F (36.6 C), temperature source Oral, resp. rate (!) 26, height 5' 8 (1.727 m), weight 72.3 kg, SpO2 98%.        Intake/Output Summary (Last 24 hours) at 01/30/2024 0807 Last data filed at 01/30/2024 0555 Gross per 24 hour  Intake 1814.14 ml  Output 2210 ml  Net -395.86 ml   Filed Weights   01/29/24 0415 01/29/24 0848 01/30/24 0500  Weight: 75 kg 74.8 kg 72.3 kg   Examination: Gen:      No acute distress HEENT:  EOMI, sclera anicteric Neck:     No masses; no thyromegaly Lungs:    Clear to auscultation bilaterally; normal respiratory effort CV:        Irregular Abd:      Ileostomy Ext:    No edema; adequate peripheral perfusion Neuro: alert and oriented x 3 Psych: normal mood and affect   Labs/imaging reviewed Significant for sodium 129, BUN/creatinine 50/6.16 Hemoglobin 8.9, platelets 189   Resolved problem list   Assessment and Plan   A on C Hypotension without shock History hypotension, adrenal insufficiency Pt takes midodrine  and florinef  out of hospital but unable given SBO. Requiring low-mod norepinephrine  as a result. Mentation intact, warm extremities, not acute shock. Currently with stress steroids as well. Will return to oral midodrine  and fludrocortisone  as able.  - Pressers titration adjusted for BP goals for systolic above 80 - stress steroids - Back on midodrine .  Resume fludrocortisone  when off pressors.   - monitor mental status  SBO Hx of ulcerative colitis w/ ileostomy Surgery following, thank you. Is having output from ileostomy and he feels well. Will advance again, clear liquid diet. - advance to clears - can restart orals if tolerates clears, ng tube pulled - pain management prn. Dilaudid   (sparingly) and IV tylenol .   Chronic osteomyelitis Chronic Presacral fluid collection with cultures + for E Coli ESBL and Klebsiella Percutaneous drain - chronic presacral fluid collection Appreciate ID eval. Chronic colonization not clinically significant here. Abx stopped. - Trend WBCs and fevers   ESRD on HD Hyperkalemia L nephrostomy tube Nephro following, on hemodialysis   A-fib on Eliquis  dCHF Heparin  gtt and tele monitoring.   Anemia of chronic disease At baseline.   Moderate pericardial effusion Elevated Troponin: likely demand ischemia Repeat echo with reduced pericardial effusion.  Cardiology signed off  Hx of CVA -hold statin while npo   Hx of copd Denies baseline O2 requirement. on dulera  -wean [place] for sats 88-92% -breo-ellipta; prn duoneb   Depression -hold home zoloft    Peripheral neuropathy -hold gaba while npo  Best Practice (right click and Reselect all SmartList Selections daily)   Diet/type: P.o. diet DVT prophylaxis systemic heparin  Pressure ulcer(s): pressure ulcer assessment deferred  GI prophylaxis: N/A Lines: N/A Foley:  N/A Code Status:  full code Last date of multidisciplinary goals of care discussion [7/22 patient updated at bedside]  Critical care time:    The patient is critically ill with multiple organ system failure and requires high complexity decision making for assessment and support, frequent evaluation and titration of therapies, advanced monitoring, review of radiographic studies and interpretation of complex data.   Critical Care Time devoted to patient care services, exclusive of separately billable procedures, described in this note is 35 minutes.   Necole Minassian MD [facility] See Amion for pager  If no response to pager , please call [phone] until 7pm After 7:00 pm call Elink  [phone] 01/30/2024, 8:33 AM

## 2024-01-31 DIAGNOSIS — I959 Hypotension, unspecified: Secondary | ICD-10-CM | POA: Diagnosis not present

## 2024-01-31 DIAGNOSIS — K56609 Unspecified intestinal obstruction, unspecified as to partial versus complete obstruction: Secondary | ICD-10-CM | POA: Diagnosis not present

## 2024-01-31 DIAGNOSIS — N186 End stage renal disease: Secondary | ICD-10-CM | POA: Diagnosis not present

## 2024-01-31 DIAGNOSIS — Z992 Dependence on renal dialysis: Secondary | ICD-10-CM | POA: Diagnosis not present

## 2024-01-31 LAB — CBC
HCT: 28 % — ABNORMAL LOW (ref 39.0–52.0)
Hemoglobin: 8.8 g/dL — ABNORMAL LOW (ref 13.0–17.0)
MCH: 36.4 pg — ABNORMAL HIGH (ref 26.0–34.0)
MCHC: 31.4 g/dL (ref 30.0–36.0)
MCV: 115.7 fL — ABNORMAL HIGH (ref 80.0–100.0)
Platelets: 212 K/uL (ref 150–400)
RBC: 2.42 MIL/uL — ABNORMAL LOW (ref 4.22–5.81)
RDW: 18.6 % — ABNORMAL HIGH (ref 11.5–15.5)
WBC: 8.2 K/uL (ref 4.0–10.5)
nRBC: 6.1 % — ABNORMAL HIGH (ref 0.0–0.2)

## 2024-01-31 LAB — GLUCOSE, CAPILLARY
Glucose-Capillary: 129 mg/dL — ABNORMAL HIGH (ref 70–99)
Glucose-Capillary: 137 mg/dL — ABNORMAL HIGH (ref 70–99)
Glucose-Capillary: 155 mg/dL — ABNORMAL HIGH (ref 70–99)
Glucose-Capillary: 155 mg/dL — ABNORMAL HIGH (ref 70–99)
Glucose-Capillary: 160 mg/dL — ABNORMAL HIGH (ref 70–99)
Glucose-Capillary: 170 mg/dL — ABNORMAL HIGH (ref 70–99)

## 2024-01-31 LAB — BASIC METABOLIC PANEL WITH GFR
Anion gap: 15 (ref 5–15)
BUN: 61 mg/dL — ABNORMAL HIGH (ref 8–23)
CO2: 21 mmol/L — ABNORMAL LOW (ref 22–32)
Calcium: 8 mg/dL — ABNORMAL LOW (ref 8.9–10.3)
Chloride: 92 mmol/L — ABNORMAL LOW (ref 98–111)
Creatinine, Ser: 6.82 mg/dL — ABNORMAL HIGH (ref 0.61–1.24)
GFR, Estimated: 7 mL/min — ABNORMAL LOW (ref >60)
Glucose, Bld: 159 mg/dL — ABNORMAL HIGH (ref 70–99)
Potassium: 4.4 mmol/L (ref 3.5–5.1)
Sodium: 128 mmol/L — ABNORMAL LOW (ref 135–145)

## 2024-01-31 LAB — MAGNESIUM: Magnesium: 1.8 mg/dL (ref 1.7–2.4)

## 2024-01-31 LAB — PHOSPHORUS: Phosphorus: 7 mg/dL — ABNORMAL HIGH (ref 2.5–4.6)

## 2024-01-31 MED ORDER — CHLORHEXIDINE GLUCONATE CLOTH 2 % EX PADS
6.0000 | MEDICATED_PAD | Freq: Every day | CUTANEOUS | Status: DC
  Administered 2024-02-02: 6 via TOPICAL

## 2024-01-31 MED ORDER — ALBUMIN HUMAN 25 % IV SOLN
25.0000 g | Freq: Two times a day (BID) | INTRAVENOUS | Status: AC
  Administered 2024-01-31 (×2): 25 g via INTRAVENOUS
  Filled 2024-01-31 (×2): qty 100

## 2024-01-31 MED ORDER — OXYCODONE HCL 5 MG PO TABS
5.0000 mg | ORAL_TABLET | ORAL | Status: DC | PRN
  Administered 2024-01-31: 5 mg via ORAL
  Filled 2024-01-31: qty 1

## 2024-01-31 MED ORDER — MIDODRINE HCL 5 MG PO TABS
30.0000 mg | ORAL_TABLET | Freq: Three times a day (TID) | ORAL | Status: DC
  Administered 2024-01-31 – 2024-02-03 (×9): 30 mg via ORAL
  Filled 2024-01-31 (×9): qty 6

## 2024-01-31 NOTE — Progress Notes (Signed)
 [facility] Progress Note  Subjective:  Seen in ICU Advancing to soft diet today ^'d output fro ostomy  Vitals:   01/31/24 0945 01/31/24 1000 01/31/24 1015 01/31/24 1030  BP: (!) 82/62 (!) 86/67 (!) 89/71 92/68  Pulse: 68 71 67 75  Resp: 10 10 10 11   Temp:      TempSrc:      SpO2: 97% 98% 96% 97%  Weight:      Height:        Exam: Gen alert, no distress, not on O2 No jvd or bruits Chest clear bilat to bases RRR no RG Abd soft ntnd no mass or ascites +bs, RLQ ostomy bag Ext trace pretib edema bilat Neuro is alert, Ox 3 , nf    L thigh AVG +bruit    Home bp meds: Florinef  0.1mg  every day Midodrine  20mg  tid    OP HD: [facility] MWF 2h  B350   71.5kg   2K bath   AVG   Heparin  none Rocaltrol  0.5 mcg po three times per week Mircera 225 mcg IV q 2 wks, last 7/21, due 8/04 Last OP HD 7/21, post wt 73.1kg     Assessment/ Plan: SBO: sp NG tube course, now out. Improving, soft diet today per surgery. Hypotension: acute on chronic, usual BP's at OP HD are in the 90s-100s. Home midodrine  20mg  tid resumed 7/25 and florinef  resumed today. Remains on low dose pressors.  ESRD: on HD MWF. HD Monday.  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 Follow.  L percutaneous nephrostomy tube: exit site is clean, not tender, f/b IR as outpt Recurrent presacral fluid collection drain: f/b IR outpatient   Rob Geralynn MD  [facility] 01/31/2024, 12:49 PM  Recent Labs  Lab 01/25/24 1854 01/26/24 0944 01/27/24 1013 01/28/24 0349 01/30/24 0236 01/31/24 0320  HGB 10.2*   < >  --    < > 8.9* 8.8*  ALBUMIN  4.4  --  3.1*  --   --   --   CALCIUM  9.8  --  8.7*   < > 8.4* 8.0*  PHOS  --   --   --   --   --  7.0*  CREATININE 6.02*   < > 7.50*   < > 6.16* 6.82*  K 4.1  --  5.4*   < > 4.0 4.4   < > = values in this interval not displayed.   No results for input(s): IRON, TIBC, FERRITIN in the last 168 hours. Inpatient medications:  apixaban   2.5  mg Oral BID   Chlorhexidine  Gluconate Cloth  6 each Topical Daily   dorzolamide   1 drop Right Eye QHS   fludrocortisone   0.1 mg Oral Daily   fluticasone  furoate-vilanterol  1 puff Inhalation Daily   insulin  aspart  0-15 Units Subcutaneous Q4H   midodrine   20 mg Oral TID   pantoprazole  (PROTONIX ) IV  40 mg Intravenous Q24H   timolol   1 drop Both Eyes BID    albumin  human Stopped (01/30/24 1959)   albumin  human Stopped (01/30/24 1958)   albumin  human 25 g (01/31/24 1059)   anticoagulant sodium citrate      norepinephrine  (LEVOPHED ) Adult infusion 3 mcg/min (01/31/24 1000)   albumin  human, albumin  human, alteplase , anticoagulant sodium citrate , feeding supplement (NEPRO CARB STEADY), heparin , HYDROmorphone  (DILAUDID ) injection, ipratropium-albuterol , lidocaine  (PF), lidocaine -prilocaine , ondansetron  (ZOFRAN ) IV, mouth rinse, oxyCODONE , pentafluoroprop-tetrafluoroeth

## 2024-01-31 NOTE — Progress Notes (Addendum)
 NAME:  Joshua Riley, MRN:  [id number], DOB:  24-Aug-1935, LOS: 5 ADMISSION DATE:  01/25/2024, CONSULTATION DATE:  7/22 REFERRING MD:  Dr. Lee, CHIEF COMPLAINT:  hypotension; sbo   History of Present Illness:   Patient is a 88 year old male with pertinent PMH ESRD on HD MWF, dCHF, A-fib on Eliquis , ulcerative colitis w/ ileostomy, CVA, chronic sacral osteomyelitis (recently completed ertapenem  and vanc), orthostatic hypotension presents to [facility] on 7/22 with SBO.   Patient having abd on 7/21 that progressively worsened w/ N/V came to [facility] ED later that night. On arrival patient tachycardic and hypotensive. Patient afebrile and wbc 11.  Cultures obtained, given iv fluids, started on aztreonam  and vanc. LA 2.6 then 1.6. PCT 2.3. CXR no acute findings. Covid/flu/rsv negative. CT abd/pelvis w/ small parastomal hernia contains fat and a knuckle of small bowel; Interim development of moderate to marked fluid distension of the stomach with multiple dilated fluid-filled loops of proximal to mid small bowel and fecalized bowel in the pelvis consistent with small bowel obstruction; moderate pericardial effusion. Surgery consulted. NG tube placed and made npo. Trop 250. Echo lvef 60-65%; moderate pericardial effusion; RA pressure 15. Cards consulted. On 7/22 patient remained hypotensive despite iv fluids. Unable to give home midodrine /florinef  while npo. PCCM consulted.   Pertinent  Medical History   Adrenal insufficiency, chronic hypotension, Afib on Eliquis , Hx DVT, Hydronephrosis with pcn, peripheral neuropathy, Ileostomy, Ulcerative colitis, orthostatic hypotension  Significant Hospital Events: Including procedures, antibiotic start and stop dates in addition to other pertinent events   7/21 admit w/ sbo and hypotension.  Surgery consulted and recommended conservative management 7/22 remains hypotensive despite fluids; pccm consulted.  Started on peripheral pressors 7/25 repeat echo shows reduction in  size of pericardial effusion, no tamponade.  Cardiology signed off.  Okay to eat per surgery and restarted p.o. midodrine   Interim History / Subjective:   Remains on norepinephrine  drip, diet being advanced by surgery  Objective    Blood pressure 91/73, pulse 96, temperature (!) 96.6 F (35.9 C), temperature source Axillary, resp. rate 11, height 5' 8 (1.727 m), weight 77.5 kg, SpO2 98%.        Intake/Output Summary (Last 24 hours) at 01/31/2024 0858 Last data filed at 01/31/2024 0700 Gross per 24 hour  Intake 525.55 ml  Output 760 ml  Net -234.45 ml   Filed Weights   01/29/24 0848 01/30/24 0500 01/31/24 0500  Weight: 74.8 kg 72.3 kg 77.5 kg   Examination: Gen:      No acute distress HEENT:  EOMI, sclera anicteric Neck:     No masses; no thyromegaly Lungs:    Clear to auscultation bilaterally; normal respiratory effort CV:        Irregular Abd:      Ostomy Ext:    No edema; adequate peripheral perfusion Neuro: alert and oriented x 3 Psych: normal mood and affect   Labs/imaging reviewed Significant for sodium 128, BUN/creatinine 61/6.82 Hemoglobin 8.8 No new imaging   Resolved problem list   Assessment and Plan   A on C Hypotension without shock History hypotension, adrenal insufficiency Requiring low-mod norepinephrine   - Resumed home midodrine  and Florinef  over the weekend. Increase midodrine  to 30mg  tid - Change BP goals to SBP of 80 and MAP 60 - Give dose of albumin  to be able to get him off pressors - Monitor mental status   SBO, improving with conservative management Hx of ulcerative colitis w/ ileostomy - Surgery is following.  Advancing diet as tolerated -  pain management prn. Dilaudid  (sparingly) and IV tylenol .   Chronic osteomyelitis Chronic Presacral fluid collection with cultures + for E Coli ESBL and Klebsiella Percutaneous drain - chronic presacral fluid collection Appreciate ID eval. Chronic colonization not clinically significant here. Abx  stopped. - Trend WBCs and fevers   ESRD on HD Hyperkalemia L nephrostomy tube Nephro following, on hemodialysis   A-fib on Eliquis  dCHF Heparin  gtt changed to Eliquis    Anemia of chronic disease At baseline.   Moderate pericardial effusion Elevated Troponin: likely demand ischemia Repeat echo with reduced pericardial effusion.  Cardiology signed off  Hx of CVA -hold statin while npo   Hx of copd Denies baseline O2 requirement. on dulera  -wean [place] for sats 88-92% -breo-ellipta; prn duoneb   Depression -hold home zoloft    Peripheral neuropathy -hold gaba while npo  Best Practice (right click and Reselect all SmartList Selections daily)   Diet/type: P.o. diet DVT prophylaxis Eliquis  Pressure ulcer(s): pressure ulcer assessment deferred  GI prophylaxis: PPI Lines: N/A Foley:  N/A Code Status:  full code Last date of multidisciplinary goals of care discussion [7/27 patient updated at bedside]  Critical care time:    The patient is critically ill with multiple organ system failure and requires high complexity decision making for assessment and support, frequent evaluation and titration of therapies, advanced monitoring, review of radiographic studies and interpretation of complex data.   Critical Care Time devoted to patient care services, exclusive of separately billable procedures, described in this note is 35 minutes.   Corabelle Spackman MD [place] [facility] See Amion for pager  If no response to pager , please call [phone] until 7pm After 7:00 pm call Elink  [phone] 01/31/2024, 8:58 AM

## 2024-01-31 NOTE — Progress Notes (Signed)
 Assessment & Plan: SBO - tolerated FLD - advance to soft diet today - increased output from ostomy - abd soft, non-tender Recurrent presacral fluid collection with drain in place  - Left trans gluteal percutaneous drainage catheter with pigtail inthe presacral region at the site of prior fluid collection. IR following as outpatient.    FEN - Soft. IVF per primary. Electrolytes per neprhology VTE - SCDs, hold Eliquis , heparin  gtt.  ID - None per ID   - Per primary -  Acute on Chronic hypotension - on Midodrine  and florinef  at baseline. Currently on low dose levo.  Hx pericarditis ESRD on HD MWF dCHF A-fib on Eliquis  - please hold eliquis  Hx ulcerative colitis s/p TAC and ileostomy  CVA COPD Chronic sacral osteomyelitis (recently completed ertapenem  and vanc)        Krystal Spinner, MD [facility] A [facility] practice Office: [phone]        Chief Complaint: SBO  Subjective: Patient comfortable, wants to go home.  Tolerating FLD.  Denies nausea, emesis, pain.  Objective: Vital signs in last 24 hours: Temp:  [96.6 F (35.9 C)-98 F (36.7 C)] 96.6 F (35.9 C) (07/27 0346) Pulse Rate:  [63-96] 96 (07/27 0415) Resp:  [8-33] 11 (07/27 0700) BP: (70-106)/(49-83) 91/73 (07/27 0700) SpO2:  [89 %-100 %] 98 % (07/27 0415) Weight:  [77.5 kg] 77.5 kg (07/27 0500) Last BM Date : 01/30/24  Intake/Output from previous day: 07/26 0701 - 07/27 0700 In: 546.7 [P.O.:300; I.V.:246.7] Out: 760 [Drains:10; Stool:750] Intake/Output this shift: No intake/output data recorded.  Physical Exam: HEENT - sclerae clear, mucous membranes moist Neck - soft Abdomen - soft, minimal distension; liquid stool in ostomy; drain with thin tan-brown output in bag  Lab Results:  Recent Labs    01/30/24 0236 01/31/24 0320  WBC 8.3 8.2  HGB 8.9* 8.8*  HCT 28.3* 28.0*  PLT 189 212   BMET Recent Labs    01/30/24 0236 01/31/24 0320  NA 129* 128*  K 4.0 4.4  CL 89* 92*   CO2 24 21*  GLUCOSE 128* 159*  BUN 50* 61*  CREATININE 6.16* 6.82*  CALCIUM  8.4* 8.0*   PT/INR No results for input(s): LABPROT, INR in the last 72 hours. Comprehensive Metabolic Panel:    Component Value Date/Time   NA 128 (L) 01/31/2024 0320   NA 129 (L) 01/30/2024 0236   NA 138 04/10/2023 1541   NA 139 07/17/2022 1034   K 4.4 01/31/2024 0320   K 4.0 01/30/2024 0236   CL 92 (L) 01/31/2024 0320   CL 89 (L) 01/30/2024 0236   CO2 21 (L) 01/31/2024 0320   CO2 24 01/30/2024 0236   BUN 61 (H) 01/31/2024 0320   BUN 50 (H) 01/30/2024 0236   BUN 19 04/10/2023 1541   BUN 37 (H) 07/17/2022 1034   CREATININE 6.82 (H) 01/31/2024 0320   CREATININE 6.16 (H) 01/30/2024 0236   GLUCOSE 159 (H) 01/31/2024 0320   GLUCOSE 128 (H) 01/30/2024 0236   CALCIUM  8.0 (L) 01/31/2024 0320   CALCIUM  8.4 (L) 01/30/2024 0236   AST 19 01/27/2024 1013   AST 28 01/25/2024 1854   ALT 16 01/27/2024 1013   ALT 27 01/25/2024 1854   ALKPHOS 83 01/27/2024 1013   ALKPHOS 144 (H) 01/25/2024 1854   BILITOT 1.0 01/27/2024 1013   BILITOT 0.7 01/25/2024 1854   PROT 6.4 (L) 01/27/2024 1013   PROT 7.9 01/25/2024 1854   ALBUMIN  3.1 (L) 01/27/2024 1013  ALBUMIN  4.4 01/25/2024 1854    Studies/Results: ECHOCARDIOGRAM LIMITED Result Date: 01/29/2024    ECHOCARDIOGRAM LIMITED REPORT   Patient Name:   Joshua Riley Date of Exam: 01/29/2024 Medical Rec #:  [id number]       Height:       68.0 in Accession #:    [id number]      Weight:       164.9 lb Date of Birth:  [birth year]       BSA:          1.883 m Patient Age:    88 years        BP:           106/71 mmHg Patient Gender: M               HR:           87 bpm. Exam Location:  Inpatient Procedure: Limited Echo (Both Spectral and Color Flow Doppler were utilized            during procedure). Indications:    I31.3 Pericardial effusion (noninflammatory)  History:        Patient has prior history of Echocardiogram examinations, most                 recent 01/26/2024. COPD  and ESRD, Arrythmias:Atrial Fibrillation;                 Risk Factors:Sleep Apnea.  Sonographer:    Damien Senior RDCS Referring Phys: [id number] BRIDGETTE CHRISTOPHER  Sonographer Comments: Limited to reassess effusion IMPRESSIONS  1. Effusion appears much smaller than prior. Epicardial fat visualized. There is no evidence of cardiac tamponade.  2. The inferior vena cava is dilated in size with <50% respiratory variability, suggesting right atrial pressure of 15 mmHg. FINDINGS  Left Ventricle: Pericardium: Effusion appears much smaller than prior. Epicardial fat visualized. Trivial pericardial effusion is present. There is no evidence of cardiac tamponade. Venous: The inferior vena cava is dilated in size with less than 50% respiratory variability, suggesting right atrial pressure of 15 mmHg. Morene Brownie Electronically signed by Morene Brownie Signature Date/Time: 01/29/2024/1:33:26 PM    Final       Krystal Spinner 01/31/2024  Patient ID: Joshua Riley, male   DOB: 05-28-1936, 88 y.o.   MRN: [id number]

## 2024-02-01 DIAGNOSIS — K56609 Unspecified intestinal obstruction, unspecified as to partial versus complete obstruction: Secondary | ICD-10-CM | POA: Diagnosis not present

## 2024-02-01 DIAGNOSIS — I959 Hypotension, unspecified: Secondary | ICD-10-CM | POA: Diagnosis not present

## 2024-02-01 DIAGNOSIS — N186 End stage renal disease: Secondary | ICD-10-CM | POA: Diagnosis not present

## 2024-02-01 DIAGNOSIS — Z992 Dependence on renal dialysis: Secondary | ICD-10-CM | POA: Diagnosis not present

## 2024-02-01 LAB — BASIC METABOLIC PANEL WITH GFR
Anion gap: 17 — ABNORMAL HIGH (ref 5–15)
BUN: 70 mg/dL — ABNORMAL HIGH (ref 8–23)
CO2: 20 mmol/L — ABNORMAL LOW (ref 22–32)
Calcium: 8.1 mg/dL — ABNORMAL LOW (ref 8.9–10.3)
Chloride: 90 mmol/L — ABNORMAL LOW (ref 98–111)
Creatinine, Ser: 8.59 mg/dL — ABNORMAL HIGH (ref 0.61–1.24)
GFR, Estimated: 6 mL/min — ABNORMAL LOW (ref >60)
Glucose, Bld: 110 mg/dL — ABNORMAL HIGH (ref 70–99)
Potassium: 4.2 mmol/L (ref 3.5–5.1)
Sodium: 127 mmol/L — ABNORMAL LOW (ref 135–145)

## 2024-02-01 LAB — CBC
HCT: 29.7 % — ABNORMAL LOW (ref 39.0–52.0)
Hemoglobin: 9.3 g/dL — ABNORMAL LOW (ref 13.0–17.0)
MCH: 36.8 pg — ABNORMAL HIGH (ref 26.0–34.0)
MCHC: 31.3 g/dL (ref 30.0–36.0)
MCV: 117.4 fL — ABNORMAL HIGH (ref 80.0–100.0)
Platelets: 179 K/uL (ref 150–400)
RBC: 2.53 MIL/uL — ABNORMAL LOW (ref 4.22–5.81)
RDW: 19.3 % — ABNORMAL HIGH (ref 11.5–15.5)
WBC: 9.3 K/uL (ref 4.0–10.5)
nRBC: 8.1 % — ABNORMAL HIGH (ref 0.0–0.2)

## 2024-02-01 LAB — GLUCOSE, CAPILLARY
Glucose-Capillary: 98 mg/dL (ref 70–99)
Glucose-Capillary: 172 mg/dL — ABNORMAL HIGH (ref 70–99)
Glucose-Capillary: 118 mg/dL — ABNORMAL HIGH (ref 70–99)
Glucose-Capillary: 177 mg/dL — ABNORMAL HIGH (ref 70–99)
Glucose-Capillary: 128 mg/dL — ABNORMAL HIGH (ref 70–99)
Glucose-Capillary: 134 mg/dL — ABNORMAL HIGH (ref 70–99)

## 2024-02-01 LAB — CULTURE, BLOOD (ROUTINE X 2)
Culture: NO GROWTH
Culture: NO GROWTH
Special Requests: ADEQUATE

## 2024-02-01 MED ORDER — HYDROCORTISONE 10 MG PO TABS
10.0000 mg | ORAL_TABLET | Freq: Every evening | ORAL | Status: DC
  Administered 2024-02-01 – 2024-02-02 (×2): 10 mg via ORAL
  Filled 2024-02-01 (×3): qty 1

## 2024-02-01 MED ORDER — INSULIN ASPART 100 UNIT/ML IJ SOLN
0.0000 [IU] | Freq: Three times a day (TID) | INTRAMUSCULAR | Status: DC
  Administered 2024-02-01 – 2024-02-02 (×2): 3 [IU] via SUBCUTANEOUS

## 2024-02-01 MED ORDER — PANTOPRAZOLE SODIUM 40 MG PO TBEC
40.0000 mg | DELAYED_RELEASE_TABLET | Freq: Every day | ORAL | Status: DC
  Administered 2024-02-01 – 2024-02-03 (×3): 40 mg via ORAL
  Filled 2024-02-01 (×3): qty 1

## 2024-02-01 MED ORDER — HYDROCORTISONE 20 MG PO TABS
20.0000 mg | ORAL_TABLET | Freq: Every day | ORAL | Status: DC
  Administered 2024-02-01 – 2024-02-03 (×3): 20 mg via ORAL
  Filled 2024-02-01 (×3): qty 1

## 2024-02-01 NOTE — Progress Notes (Signed)
 NAME:  Joshua Riley, MRN:  [id number], DOB:  11/28/1935, LOS: 6 ADMISSION DATE:  01/25/2024, CONSULTATION DATE:  7/22 REFERRING MD:  Dr. Lee, CHIEF COMPLAINT:  hypotension; sbo   History of Present Illness:   Patient is a 88 year old male with pertinent PMH ESRD on HD MWF, dCHF, A-fib on Eliquis , ulcerative colitis w/ ileostomy, CVA, chronic sacral osteomyelitis (recently completed ertapenem  and vanc), orthostatic hypotension presents to [facility] on 7/22 with SBO.   Patient having abd on 7/21 that progressively worsened w/ N/V came to [facility] ED later that night. On arrival patient tachycardic and hypotensive. Patient afebrile and wbc 11.  Cultures obtained, given iv fluids, started on aztreonam  and vanc. LA 2.6 then 1.6. PCT 2.3. CXR no acute findings. Covid/flu/rsv negative. CT abd/pelvis w/ small parastomal hernia contains fat and a knuckle of small bowel; Interim development of moderate to marked fluid distension of the stomach with multiple dilated fluid-filled loops of proximal to mid small bowel and fecalized bowel in the pelvis consistent with small bowel obstruction; moderate pericardial effusion. Surgery consulted. NG tube placed and made npo. Trop 250. Echo lvef 60-65%; moderate pericardial effusion; RA pressure 15. Cards consulted. On 7/22 patient remained hypotensive despite iv fluids. Unable to give home midodrine /florinef  while npo. PCCM consulted.   Pertinent  Medical History   Adrenal insufficiency, chronic hypotension, Afib on Eliquis , Hx DVT, Hydronephrosis with pcn, peripheral neuropathy, Ileostomy, Ulcerative colitis, orthostatic hypotension  Significant Hospital Events: Including procedures, antibiotic start and stop dates in addition to other pertinent events   7/21 admit w/ sbo and hypotension.  Surgery consulted and recommended conservative management 7/22 remains hypotensive despite fluids; pccm consulted.  Started on peripheral pressors 7/25 repeat echo shows reduction in  size of pericardial effusion, no tamponade.  Cardiology signed off.  Okay to eat per surgery and restarted p.o. midodrine   Interim History / Subjective:   Remains on norepinephrine  drip, BP at baseline per patient, he wants to go home, discussed plan of care with patient and daughter at bedside  Objective    Blood pressure (!) 80/59, pulse 95, temperature (!) 97.4 F (36.3 C), temperature source Oral, resp. rate 12, height 5' 8 (1.727 m), weight 76.1 kg, SpO2 97%.        Intake/Output Summary (Last 24 hours) at 02/01/2024 1002 Last data filed at 02/01/2024 0900 Gross per 24 hour  Intake 412.37 ml  Output 725 ml  Net -312.63 ml   Filed Weights   01/30/24 0500 01/31/24 0500 02/01/24 0335  Weight: 72.3 kg 77.5 kg 76.1 kg   Examination: Gen:      No acute distress HEENT:  EOMI, sclera anicteric Neck:     No masses; no thyromegaly Lungs:    Clear to auscultation bilaterally; normal respiratory effort CV:        Irregular Abd:      Ostomy Ext:    No edema; adequate peripheral perfusion Neuro: alert and oriented x 3 Psych: normal mood and affect   Labs/imaging reviewed   Resolved problem list   Assessment and Plan   A on C Hypotension without shock History hypotension, adrenal insufficiency Requiring low dose norepinephrine   - Continue home Florinef  over the weekend. - On increase midodrine  30mg  tid - Add Hydrocortisone  20 mg/10 mg AM and PM - Change BP goals to MAP 60, can lowe rto 55 if asymptomatic   SBO, improving with conservative management Hx of ulcerative colitis w/ ileostomy -- Surgery is following.  Advancing diet as tolerated -- pain  management prn. Dilaudid  -- Home sulfasalazine   Chronic osteomyelitis Chronic Presacral fluid collection with cultures + for E Coli ESBL and Klebsiella Percutaneous drain - chronic presacral fluid collection Appreciate ID eval. Chronic colonization not clinically significant here. Abx stopped. - Trend WBCs and fevers    ESRD on HD Hyperkalemia L nephrostomy tube -- Appreciate Nephrology assistance , on hemodialysis   A-fib on Eliquis  dCHF -- Eliquis    Moderate pericardial effusion Elevated Troponin: likely demand ischemia -- Repeat echo with reduced pericardial effusion.  Cardiology signed off  Hx of CVA -- hold statin while npo   Hx of copd: Denies baseline O2 requirement. on dulera  -- wean [place] for sats 88-92% -- breo-ellipta; prn duoneb   Depression -- hold home zoloft    Best Practice (right click and Reselect all SmartList Selections daily)   Diet/type: P.o. diet DVT prophylaxis Eliquis  Pressure ulcer(s): pressure ulcer assessment deferred  GI prophylaxis: PPI Lines: N/A Foley:  N/A Code Status:  full code Last date of multidisciplinary goals of care discussion [7/28 patient updated at bedside]  Critical care time:    CRITICAL CARE Performed by: Donnice SAUNDERS Eman Rynders   Total critical care time: 31 minutes  Critical care time was exclusive of separately billable procedures and treating other patients.  Critical care was necessary to treat or prevent imminent or life-threatening deterioration.  Critical care was time spent personally by me on the following activities: development of treatment plan with patient and/or surrogate as well as nursing, discussions with consultants, evaluation of patient's response to treatment, examination of patient, obtaining history from patient or surrogate, ordering and performing treatments and interventions, ordering and review of laboratory studies, ordering and review of radiographic studies, pulse oximetry and re-evaluation of patient's condition.   Donnice SAUNDERS Beals, MD [facility] See Amion   If no response to pager, please call [phone] until 7pm After 7:00 pm call Elink  [phone] 02/01/2024, 10:02 AM

## 2024-02-01 NOTE — Progress Notes (Signed)
 Subjective: CC: Patient reports no abdominal pain, n/v. Tolerating diet and having ostomy output.   Aferbile. HR 110's. Back on Florinef  and Midodrine  (baseline 20mg  TID, now on 30mg  TID). Remains on 2mcg/min of levo. WBC wnl.  Has been placed back on eliquis .    Objective: Vital signs in last 24 hours: Temp:  [96.1 F (35.6 C)-97.7 F (36.5 C)] 97.4 F (36.3 C) (07/28 0812) Pulse Rate:  [67-179] 111 (07/28 0801) Resp:  [7-31] 16 (07/28 0801) BP: (70-114)/(43-93) 92/65 (07/28 0801) SpO2:  [68 %-100 %] 99 % (07/28 0801) Weight:  [76.1 kg] 76.1 kg (07/28 0335) Last BM Date : 01/31/24  Intake/Output from previous day: 07/27 0701 - 07/28 0700 In: 431.2 [I.V.:259.8; IV Piggyback:171.4] Out: 475 [Stool:475] Intake/Output this shift: No intake/output data recorded.  PE: Gen:  Alert, NAD, pleasant Abd: Soft, ND, NT, stoma viable, ileostomy bag with air and liquid stool in bag. JP cloudy SS  Lab Results:  Recent Labs    01/31/24 0320 02/01/24 0300  WBC 8.2 9.3  HGB 8.8* 9.3*  HCT 28.0* 29.7*  PLT 212 179   BMET Recent Labs    01/31/24 0320 02/01/24 0300  NA 128* 127*  K 4.4 4.2  CL 92* 90*  CO2 21* 20*  GLUCOSE 159* 110*  BUN 61* 70*  CREATININE 6.82* 8.59*  CALCIUM  8.0* 8.1*   PT/INR No results for input(s): LABPROT, INR in the last 72 hours. CMP     Component Value Date/Time   NA 127 (L) 02/01/2024 0300   NA 138 04/10/2023 1541   K 4.2 02/01/2024 0300   CL 90 (L) 02/01/2024 0300   CO2 20 (L) 02/01/2024 0300   GLUCOSE 110 (H) 02/01/2024 0300   BUN 70 (H) 02/01/2024 0300   BUN 19 04/10/2023 1541   CREATININE 8.59 (H) 02/01/2024 0300   CALCIUM  8.1 (L) 02/01/2024 0300   PROT 6.4 (L) 01/27/2024 1013   ALBUMIN  3.1 (L) 01/27/2024 1013   AST 19 01/27/2024 1013   ALT 16 01/27/2024 1013   ALKPHOS 83 01/27/2024 1013   BILITOT 1.0 01/27/2024 1013   GFRNONAA 6 (L) 02/01/2024 0300   Lipase     Component Value Date/Time   LIPASE 49 09/06/2023  1808    Studies/Results: No results found.  Anti-infectives: Anti-infectives (From admission, onward)    Start     Dose/Rate Route Frequency Ordered Stop   01/26/24 2200  metroNIDAZOLE  (FLAGYL ) IVPB 500 mg  Status:  Discontinued        500 mg 100 mL/hr over 60 Minutes Intravenous Every 12 hours 01/26/24 2143 01/27/24 1124   01/26/24 2015  vancomycin  (VANCOREADY) IVPB 500 mg/100 mL        500 mg 100 mL/hr over 60 Minutes Intravenous  Once 01/26/24 1926 01/27/24 0000   01/26/24 2015  aztreonam  (AZACTAM ) 2 g in sodium chloride  0.9 % 100 mL IVPB  Status:  Discontinued        2 g 200 mL/hr over 30 Minutes Intravenous Every 24 hours 01/26/24 1926 01/27/24 1124   01/26/24 1926  vancomycin  variable dose per unstable renal function (pharmacist dosing)  Status:  Discontinued         Does not apply See admin instructions 01/26/24 1926 01/27/24 1124   01/25/24 2000  aztreonam  (AZACTAM ) 2 g in sodium chloride  0.9 % 100 mL IVPB        2 g 200 mL/hr over 30 Minutes Intravenous  Once 01/25/24 1958 01/25/24 2101  01/25/24 2000  metroNIDAZOLE  (FLAGYL ) IVPB 500 mg        500 mg 100 mL/hr over 60 Minutes Intravenous  Once 01/25/24 1958 01/25/24 2235   01/25/24 2000  vancomycin  (VANCOCIN ) IVPB 1000 mg/200 mL premix        1,000 mg 200 mL/hr over 60 Minutes Intravenous  Once 01/25/24 1958 01/26/24 0019        Assessment/Plan SBO - Patient clinically resolving with resolution of abdominal pain, n/v; tolerating diet and having bowel function; NT on exam.  - We will sign off. Please call back with questions, concerns or changes.   Recurrent presacral fluid collection with drain in place  - Left trans gluteal percutaneous drainage catheter with pigtail inthe presacral region at the site of prior fluid collection. IR following as outpatient.    FEN - Soft. IVF per primary. Electrolytes per neprhology VTE - SCDs, Eliquis  ID - None per ID   - Per primary -  Acute on Chronic hypotension - on  Midodrine  and florinef  at baseline. Currently on low dose levo.  Hx pericarditis ESRD on HD MWF dCHF A-fib on Eliquis  Hx ulcerative colitis s/p TAC and ileostomy  CVA COPD Chronic sacral osteomyelitis (recently completed ertapenem  and vanc)  I reviewed nursing notes, hospitalist notes, last 24 h vitals and pain scores, last 48 h intake and output, last 24 h labs and trends, and last 24 h imaging results.   LOS: 6 days    Joshua Riley Shaper, [facility] 02/01/2024, 8:54 AM Please see Amion for pager number during day hours 7:00am-4:30pm

## 2024-02-01 NOTE — Plan of Care (Signed)
  Problem: Education: Goal: Knowledge of General Education information will improve Description: Including pain rating scale, medication(s)/side effects and non-pharmacologic comfort measures Outcome: Progressing   Problem: Health Behavior/Discharge Planning: Goal: Ability to manage health-related needs will improve Outcome: Progressing   Problem: Clinical Measurements: Goal: Ability to maintain clinical measurements within normal limits will improve Outcome: Progressing Goal: Will remain free from infection Outcome: Progressing Goal: Diagnostic test results will improve Outcome: Progressing Goal: Respiratory complications will improve Outcome: Progressing Goal: Cardiovascular complication will be avoided Outcome: Progressing   Problem: Activity: Goal: Risk for activity intolerance will decrease Outcome: Progressing   Problem: Coping: Goal: Level of anxiety will decrease Outcome: Progressing   Problem: Elimination: Goal: Will not experience complications related to bowel motility Outcome: Progressing Goal: Will not experience complications related to urinary retention Outcome: Progressing   Problem: Pain Managment: Goal: General experience of comfort will improve and/or be controlled Outcome: Progressing   Problem: Safety: Goal: Ability to remain free from injury will improve Outcome: Progressing   Problem: Skin Integrity: Goal: Risk for impaired skin integrity will decrease Outcome: Progressing   Problem: Education: Goal: Ability to describe self-care measures that may prevent or decrease complications (Diabetes Survival Skills Education) will improve Outcome: Progressing Goal: Individualized Educational Video(s) Outcome: Progressing   Problem: Coping: Goal: Ability to adjust to condition or change in health will improve Outcome: Progressing   Problem: Fluid Volume: Goal: Ability to maintain a balanced intake and output will improve Outcome: Progressing    Problem: Health Behavior/Discharge Planning: Goal: Ability to identify and utilize available resources and services will improve Outcome: Progressing Goal: Ability to manage health-related needs will improve Outcome: Progressing   Problem: Metabolic: Goal: Ability to maintain appropriate glucose levels will improve Outcome: Progressing   Problem: Nutritional: Goal: Maintenance of adequate nutrition will improve Outcome: Progressing Goal: Progress toward achieving an optimal weight will improve Outcome: Progressing   Problem: Skin Integrity: Goal: Risk for impaired skin integrity will decrease Outcome: Progressing   Problem: Tissue Perfusion: Goal: Adequacy of tissue perfusion will improve Outcome: Progressing

## 2024-02-01 NOTE — Progress Notes (Addendum)
 [facility] Progress Note  Subjective:  Patient feels well today and hoping to get out of the ICU.  Wants to get dialysis as soon as possible.  Denies any complaints  Vitals:   02/01/24 0700 02/01/24 0801 02/01/24 0812 02/01/24 0900  BP: (!) 87/58 92/65  (!) 80/59  Pulse: 88 (!) 111  95  Resp: (!) 21 16  12   Temp:   (!) 97.4 F (36.3 C)   TempSrc:   Oral   SpO2: (!) 73% 99%  97%  Weight:      Height:        Exam: Gen alert, no distress, sitting in chair No jvd or bruits Bilateral chest rise no increased work of breathing Normal rate with no rubs Abd soft ntnd no mass or ascites +bs, RLQ ostomy bag Trace lower extremity edema Neuro is alert, Ox 3 , nf    L thigh AVG +bruit    Home bp meds: Florinef  0.1mg  every day Midodrine  20mg  tid    OP HD: [facility] MWF 2h  B350   71.5kg   2K bath   AVG   Heparin  none Rocaltrol  0.5 mcg po three times per week Mircera 225 mcg IV q 2 wks, last 7/21, due 8/04 Last OP HD 7/21, post wt 73.1kg     Assessment/ Plan: SBO: sp NG tube course, now out. Improving, soft diet today per surgery. Hypotension: acute on chronic, usual BP's at OP HD are in the 90s-100s. Home midodrine  20mg  tid and Florinef .  Remains on low dose pressors.  ESRD: on HD MWF.  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 Follow.  L percutaneous nephrostomy tube: exit site is clean, not tender, f/b IR as outpt Recurrent presacral fluid collection drain: f/b IR outpatient Hyponatremia: Volume removal with dialysis as tolerated    02/01/2024, 9:47 AM  Recent Labs  Lab 01/25/24 1854 01/26/24 0944 01/27/24 1013 01/28/24 0349 01/31/24 0320 02/01/24 0300  HGB 10.2*   < >  --    < > 8.8* 9.3*  ALBUMIN  4.4  --  3.1*  --   --   --   CALCIUM  9.8  --  8.7*   < > 8.0* 8.1*  PHOS  --   --   --   --  7.0*  --   CREATININE 6.02*   < > 7.50*   < > 6.82* 8.59*  K 4.1  --  5.4*   < > 4.4 4.2   < >  = values in this interval not displayed.   No results for input(s): IRON, TIBC, FERRITIN in the last 168 hours. Inpatient medications:  apixaban   2.5 mg Oral BID   Chlorhexidine  Gluconate Cloth  6 each Topical Daily   Chlorhexidine  Gluconate Cloth  6 each Topical Q0600   dorzolamide   1 drop Right Eye QHS   fludrocortisone   0.1 mg Oral Daily   fluticasone  furoate-vilanterol  1 puff Inhalation Daily   hydrocortisone   10 mg Oral QPM   hydrocortisone   20 mg Oral Daily   insulin  aspart  0-15 Units Subcutaneous TID WC   midodrine   30 mg Oral TID   pantoprazole   40 mg Oral Daily   timolol   1 drop Both Eyes BID    albumin  human Stopped (01/30/24 1959)   albumin  human Stopped (01/30/24 1958)   anticoagulant sodium citrate      norepinephrine  (LEVOPHED ) Adult infusion 2 mcg/min (02/01/24 0900)  albumin  human, albumin  human, alteplase , anticoagulant sodium citrate , feeding supplement (NEPRO CARB STEADY), heparin , HYDROmorphone  (DILAUDID ) injection, ipratropium-albuterol , lidocaine  (PF), lidocaine -prilocaine , ondansetron  (ZOFRAN ) IV, mouth rinse, oxyCODONE , pentafluoroprop-tetrafluoroeth

## 2024-02-02 DIAGNOSIS — I959 Hypotension, unspecified: Secondary | ICD-10-CM | POA: Diagnosis not present

## 2024-02-02 DIAGNOSIS — N186 End stage renal disease: Secondary | ICD-10-CM | POA: Diagnosis not present

## 2024-02-02 DIAGNOSIS — Z992 Dependence on renal dialysis: Secondary | ICD-10-CM | POA: Diagnosis not present

## 2024-02-02 DIAGNOSIS — K56609 Unspecified intestinal obstruction, unspecified as to partial versus complete obstruction: Secondary | ICD-10-CM | POA: Diagnosis not present

## 2024-02-02 LAB — CBC
HCT: 28.3 % — ABNORMAL LOW (ref 39.0–52.0)
Hemoglobin: 9.1 g/dL — ABNORMAL LOW (ref 13.0–17.0)
MCH: 36.7 pg — ABNORMAL HIGH (ref 26.0–34.0)
MCHC: 32.2 g/dL (ref 30.0–36.0)
MCV: 114.1 fL — ABNORMAL HIGH (ref 80.0–100.0)
Platelets: 171 K/uL (ref 150–400)
RBC: 2.48 MIL/uL — ABNORMAL LOW (ref 4.22–5.81)
RDW: 19.5 % — ABNORMAL HIGH (ref 11.5–15.5)
WBC: 8.6 K/uL (ref 4.0–10.5)
nRBC: 4.9 % — ABNORMAL HIGH (ref 0.0–0.2)

## 2024-02-02 LAB — GLUCOSE, CAPILLARY
Glucose-Capillary: 129 mg/dL — ABNORMAL HIGH (ref 70–99)
Glucose-Capillary: 95 mg/dL (ref 70–99)
Glucose-Capillary: 109 mg/dL — ABNORMAL HIGH (ref 70–99)
Glucose-Capillary: 160 mg/dL — ABNORMAL HIGH (ref 70–99)
Glucose-Capillary: 135 mg/dL — ABNORMAL HIGH (ref 70–99)

## 2024-02-02 LAB — RENAL FUNCTION PANEL
Albumin: 3.6 g/dL (ref 3.5–5.0)
Anion gap: 17 — ABNORMAL HIGH (ref 5–15)
BUN: 86 mg/dL — ABNORMAL HIGH (ref 8–23)
CO2: 18 mmol/L — ABNORMAL LOW (ref 22–32)
Calcium: 8.1 mg/dL — ABNORMAL LOW (ref 8.9–10.3)
Chloride: 88 mmol/L — ABNORMAL LOW (ref 98–111)
Creatinine, Ser: 9.72 mg/dL — ABNORMAL HIGH (ref 0.61–1.24)
GFR, Estimated: 5 mL/min — ABNORMAL LOW (ref >60)
Glucose, Bld: 132 mg/dL — ABNORMAL HIGH (ref 70–99)
Phosphorus: 6.6 mg/dL — ABNORMAL HIGH (ref 2.5–4.6)
Potassium: 4.4 mmol/L (ref 3.5–5.1)
Sodium: 123 mmol/L — ABNORMAL LOW (ref 135–145)

## 2024-02-02 LAB — CREATININE, SERUM
Creatinine, Ser: 9.83 mg/dL — ABNORMAL HIGH (ref 0.61–1.24)
GFR, Estimated: 5 mL/min — ABNORMAL LOW (ref >60)

## 2024-02-02 MED ORDER — NEPRO/CARBSTEADY PO LIQD
237.0000 mL | ORAL | Status: DC | PRN
Start: 2024-02-02 — End: 2024-02-03

## 2024-02-02 MED ORDER — LIDOCAINE HCL (PF) 1 % IJ SOLN
5.0000 mL | INTRAMUSCULAR | Status: DC | PRN
Start: 2024-02-02 — End: 2024-02-03

## 2024-02-02 MED ORDER — ALTEPLASE 2 MG IJ SOLR: 2.0000 mg | Freq: Once | INTRAMUSCULAR | Status: DC | PRN

## 2024-02-02 MED ORDER — ALBUMIN HUMAN 25 % IV SOLN
INTRAVENOUS | Status: AC
Start: 2024-02-02 — End: 2024-02-02
  Filled 2024-02-02: qty 200

## 2024-02-02 MED ORDER — LIDOCAINE-PRILOCAINE 2.5-2.5 % EX CREA
1.0000 | TOPICAL_CREAM | CUTANEOUS | Status: DC | PRN
Start: 2024-02-02 — End: 2024-02-03

## 2024-02-02 MED ORDER — PENTAFLUOROPROP-TETRAFLUOROETH EX AERO
1.0000 | INHALATION_SPRAY | CUTANEOUS | Status: DC | PRN
Start: 2024-02-02 — End: 2024-02-03

## 2024-02-02 MED ORDER — HEPARIN SODIUM (PORCINE) 1000 UNIT/ML DIALYSIS: 1000.0000 [IU] | INTRAMUSCULAR | Status: DC | PRN

## 2024-02-02 MED ORDER — PENTAFLUOROPROP-TETRAFLUOROETH EX AERO
INHALATION_SPRAY | CUTANEOUS | Status: AC
Start: 2024-02-02 — End: 2024-02-02
  Filled 2024-02-02: qty 60

## 2024-02-02 MED ORDER — ANTICOAGULANT SODIUM CITRATE 4% (200MG/5ML) IV SOLN: 5.0000 mL | Status: DC | PRN

## 2024-02-02 NOTE — Progress Notes (Signed)
 Case discussed with attending. Pt will not d/c today due daughter's concern regarding transportation to HD tomorrow. Pt's clinic advised pt will not be at appt tomorrow. Will assist as needed.   Randine Mungo Dialysis Navigator [phone]

## 2024-02-02 NOTE — Progress Notes (Signed)
   02/02/24 1107  Vitals  Temp 97.8 F (36.6 C)  Temp Source Oral  BP (!) 79/59  MAP (mmHg) 66  Pulse Rate 78  ECG Heart Rate 74  Resp 19  Weight 79 kg  Type of Weight Post-Dialysis  Oxygen Therapy  SpO2 91 %  O2 Device Room Air  During Treatment Monitoring  Duration of HD Treatment -hour(s) 2.75 hour(s)  HD Safety Checks Performed Yes  Intra-Hemodialysis Comments Tx completed  Post Treatment  Dialyzer Clearance Clear  Liters Processed 57.8  Fluid Removed (mL) 1000 mL  Tolerated HD Treatment No (Comment)  Post-Hemodialysis Comments Patient's SBP lower than 90 and MAP under 65 had UF off for a lot of session.  AVG/AVF Arterial Site Held (minutes) 10 minutes  AVG/AVF Venous Site Held (minutes) 10 minutes  Fistula / Graft Left Thigh Arteriovenous fistula  Placement Date: (c)    Placed prior to admission: Yes  Orientation: Left  Access Location: Thigh  Access Type: Arteriovenous fistula  Status Deaccessed

## 2024-02-02 NOTE — Evaluation (Signed)
 Physical Therapy Evaluation Patient Details Name: Joshua Riley MRN: [id number] DOB: [birth year] Today's Date: 02/02/2024  History of Present Illness  88 y.o. male presents to [facility] hospital on 01/25/2024 with SBO.Pt also noted to have a pericardial effusion on CT scan. PMH - ESRD on HD, afib, CVA, depression, colectomy, ileostomy, chronic hypotension, DVT, acute pericarditis, groin abscess with lt transgluteal drain, nephrostomy tubes, chf, copd  Clinical Impression  Pt presents to PT with deficits in strength, power, gait, balance, endurance. Pt is able to transfer and ambulate for short household distances with support of RW. Pt reports he is not at baseline but feels he can manage mobility within his apartment with available DME. PT encourages the pt to utilize a RW initially when returning home to allow for improvement in balance and endurance when ambulating multiple trials in a day. PT recommends HHPT at the time of discharge.        If plan is discharge home, recommend the following: Assistance with cooking/housework;Assist for transportation   Can travel by private vehicle        Equipment Recommendations None recommended by PT  Recommendations for Other Services       Functional Status Assessment Patient has had a recent decline in their functional status and demonstrates the ability to make significant improvements in function in a reasonable and predictable amount of time.     Precautions / Restrictions Precautions Precautions: Fall Recall of Precautions/Restrictions: Intact Precaution/Restrictions Comments: ostomy,   L thigh fistula, L nephrostomy tube, L posterior JP drain Restrictions Weight Bearing Restrictions Per Provider Order: No      Mobility  Bed Mobility Overal bed mobility: Needs Assistance Bed Mobility: Supine to Sit     Supine to sit: Supervision, HOB elevated          Transfers Overall transfer level: Needs assistance Equipment used: Rolling  walker (2 wheels) Transfers: Sit to/from Stand Sit to Stand: Contact guard assist, From elevated surface                Ambulation/Gait Ambulation/Gait assistance: Supervision Gait Distance (Feet): 30 Feet (30' x 2 trials) Assistive device: Rolling walker (2 wheels) Gait Pattern/deviations: Step-through pattern Gait velocity: reduced Gait velocity interpretation: <1.8 ft/sec, indicate of risk for recurrent falls   General Gait Details: slowed step-through gait, widened BOS  Stairs            Wheelchair Mobility     Tilt Bed    Modified Rankin (Stroke Patients Only)       Balance Overall balance assessment: Needs assistance Sitting-balance support: No upper extremity supported, Feet supported Sitting balance-Leahy Scale: Good     Standing balance support: Single extremity supported, Reliant on assistive device for balance Standing balance-Leahy Scale: Poor                               Pertinent Vitals/Pain Pain Assessment Pain Assessment: No/denies pain    Home Living Family/patient expects to be discharged to:: Private residence Living Arrangements: Alone Available Help at Discharge: Family;Available PRN/intermittently Type of Home: Apartment Home Access: Level entry       Home Layout: One level Home Equipment: Wheelchair - Forensic psychologist (2 wheels);Rollator (4 wheels)      Prior Function Prior Level of Function : Independent/Modified Independent;Driving             Mobility Comments: PRN use of rollator       Extremity/Trunk Assessment  Upper Extremity Assessment Upper Extremity Assessment: Overall WFL for tasks assessed    Lower Extremity Assessment Lower Extremity Assessment: Generalized weakness    Cervical / Trunk Assessment Cervical / Trunk Assessment: Kyphotic  Communication   Communication Communication: Impaired Factors Affecting Communication: Hearing impaired    Cognition Arousal:  Alert Behavior During Therapy: WFL for tasks assessed/performed   PT - Cognitive impairments: No apparent impairments                         Following commands: Intact       Cueing Cueing Techniques: Verbal cues     General Comments General comments (skin integrity, edema, etc.): VSS on RA, pt with hypotension during session, BP 105/72 in sitting, 85/56 after ambulation. Chronic hypotension at baseline, pt reports 80's/50's is typical for himself    Exercises     Assessment/Plan    PT Assessment Patient needs continued PT services  PT Problem List Decreased strength;Decreased balance;Decreased activity tolerance;Decreased mobility       PT Treatment Interventions DME instruction;Gait training;Functional mobility training;Therapeutic activities;Therapeutic exercise;Balance training;Neuromuscular re-education;Patient/family education    PT Goals (Current goals can be found in the Care Plan section)  Acute Rehab PT Goals Patient Stated Goal: to return home PT Goal Formulation: With patient Time For Goal Achievement: 02/16/24 Potential to Achieve Goals: Good    Frequency Min 2X/week     Co-evaluation               AM-PAC PT 6 Clicks Mobility  Outcome Measure Help needed turning from your back to your side while in a flat bed without using bedrails?: A Little Help needed moving from lying on your back to sitting on the side of a flat bed without using bedrails?: A Little Help needed moving to and from a bed to a chair (including a wheelchair)?: A Little Help needed standing up from a chair using your arms (e.g., wheelchair or bedside chair)?: A Little Help needed to walk in hospital room?: A Little Help needed climbing 3-5 steps with a railing? : Total 6 Click Score: 16    End of Session   Activity Tolerance: Patient tolerated treatment well Patient left: in chair;with call bell/phone within reach Nurse Communication: Mobility status PT Visit  Diagnosis: Other abnormalities of gait and mobility (R26.89);Muscle weakness (generalized) (M62.81)    Time: 8685-8661 PT Time Calculation (min) (ACUTE ONLY): 24 min   Charges:   PT Evaluation $PT Eval Low Complexity: 1 Low   PT General Charges $$ ACUTE PT VISIT: 1 Visit         [name], PT, DPT Acute Rehabilitation Office [phone]   [name] 02/02/2024, 2:07 PM

## 2024-02-02 NOTE — Progress Notes (Signed)
 NAME:  Joshua Riley, MRN:  [id number], DOB:  04-Oct-1935, LOS: 7 ADMISSION DATE:  01/25/2024, CONSULTATION DATE:  7/22 REFERRING MD:  Dr. Lee, CHIEF COMPLAINT:  hypotension; sbo   History of Present Illness:   Patient is a 88 year old male with pertinent PMH ESRD on HD MWF, dCHF, A-fib on Eliquis , ulcerative colitis w/ ileostomy, CVA, chronic sacral osteomyelitis (recently completed ertapenem  and vanc), orthostatic hypotension presents to [facility] on 7/22 with SBO.   Patient having abd on 7/21 that progressively worsened w/ N/V came to [facility] ED later that night. On arrival patient tachycardic and hypotensive. Patient afebrile and wbc 11.  Cultures obtained, given iv fluids, started on aztreonam  and vanc. LA 2.6 then 1.6. PCT 2.3. CXR no acute findings. Covid/flu/rsv negative. CT abd/pelvis w/ small parastomal hernia contains fat and a knuckle of small bowel; Interim development of moderate to marked fluid distension of the stomach with multiple dilated fluid-filled loops of proximal to mid small bowel and fecalized bowel in the pelvis consistent with small bowel obstruction; moderate pericardial effusion. Surgery consulted. NG tube placed and made npo. Trop 250. Echo lvef 60-65%; moderate pericardial effusion; RA pressure 15. Cards consulted. On 7/22 patient remained hypotensive despite iv fluids. Unable to give home midodrine /florinef  while npo. PCCM consulted.   Pertinent  Medical History   Adrenal insufficiency, chronic hypotension, Afib on Eliquis , Hx DVT, Hydronephrosis with pcn, peripheral neuropathy, Ileostomy, Ulcerative colitis, orthostatic hypotension  Significant Hospital Events: Including procedures, antibiotic start and stop dates in addition to other pertinent events   7/21 admit w/ sbo and hypotension.  Surgery consulted and recommended conservative management 7/22 remains hypotensive despite fluids; pccm consulted.  Started on peripheral pressors 7/25 repeat echo shows reduction in  size of pericardial effusion, no tamponade.  Cardiology signed off.  Okay to eat per surgery and restarted p.o. midodrine   Interim History / Subjective:   Norepinephrine  discontinued in the a.m. 7/28.  BP softer today now improved overnight in the morning.  Close at baseline systolic 80s and 09d.  Somewhat better.  May be related to hydrocortisone  standing dose started 7/28.  Objective    Blood pressure (!) 80/61, pulse 78, temperature (!) 97.4 F (36.3 C), temperature source Oral, resp. rate 15, height 5' 8 (1.727 m), weight 80 kg, SpO2 99%.        Intake/Output Summary (Last 24 hours) at 02/02/2024 1038 Last data filed at 02/02/2024 0900 Gross per 24 hour  Intake 720 ml  Output 725 ml  Net -5 ml   Filed Weights   02/01/24 0335 02/02/24 0500 02/02/24 0743  Weight: 76.1 kg 78.7 kg 80 kg   Examination: Gen:      No acute distress HEENT:  EOMI, sclera anicteric Neck:     No masses; no thyromegaly Lungs:    Clear to auscultation bilaterally; normal respiratory effort CV:        Irregular Abd:      Ostomy Ext:    No edema; adequate peripheral perfusion Neuro: alert and oriented x 3 Psych: normal mood and affect   Labs/imaging reviewed   Resolved problem list   Assessment and Plan   A on C Hypotension without shock History hypotension, adrenal insufficiency Baseline BPs in the 80s per patient report - Continue home Florinef  - On increase midodrine  30mg  tid - Added Hydrocortisone  20 mg/10 mg AM and PM 7/28 with early return slightly better blood pressure is back to baseline - Change BP goals to MAP 60, he is asymptomatic  SBO, improving with conservative management Hx of ulcerative colitis w/ ileostomy -- Surgery is following.  Advancing diet as tolerated -- pain management prn. Dilaudid  -- Home sulfasalazine   Chronic osteomyelitis Chronic Presacral fluid collection with cultures + for E Coli ESBL and Klebsiella Percutaneous drain - chronic presacral fluid  collection Appreciate ID eval. Chronic colonization not clinically significant here. Abx stopped. - Trend WBCs and fevers   ESRD on HD Hyperkalemia L nephrostomy tube -- Appreciate Nephrology assistance , on hemodialysis   A-fib on Eliquis  dCHF -- Eliquis    Moderate pericardial effusion Elevated Troponin: likely demand ischemia -- Repeat echo with reduced pericardial effusion.  Cardiology signed off  Hx of CVA -- hold statin while npo   Hx of copd: Denies baseline O2 requirement. on dulera  -- wean [place] for sats 88-92%, currently room air -- breo-ellipta; prn duoneb   Depression -- hold home zoloft   Chronic hypotension likely closer to baseline.  Tolerating dialysis 7/29.  Will have PT and OT evaluate.  Discussed disposition options that time.  He is eager to go home.  He has been stable off pressors for 24 hours and has really no other inpatient needs.  Will consider discharge later today if stable.  Best Practice (right click and Reselect all SmartList Selections daily)   Diet/type: P.o. diet DVT prophylaxis Eliquis  Pressure ulcer(s): pressure ulcer assessment deferred  GI prophylaxis: PPI Lines: N/A Foley:  N/A Code Status:  full code Last date of multidisciplinary goals of care discussion [7/29 patient updated at bedside]  Critical care time:      Joshua JONELLE Beals, MD [facility] See Amion   If no response to pager, please call [phone] until 7pm After 7:00 pm call Elink  [phone] 02/02/2024, 10:38 AM

## 2024-02-02 NOTE — Procedures (Signed)
 I was present at this dialysis session. I have reviewed the session itself and made appropriate changes.   Patient stable continues to feel well.  Tolerating dialysis when I saw him.  Chronically low blood pressures.  Not requiring pressors.  Sodium low related to volume excess.  Ultrafiltration with dialysis today.  Continue to monitor hemoglobin closely.  Filed Weights   02/01/24 0335 02/02/24 0500 02/02/24 0743  Weight: 76.1 kg 78.7 kg 80 kg    Recent Labs  Lab 02/02/24 0700  NA 123*  K 4.4  CL 88*  CO2 18*  GLUCOSE 132*  BUN 86*  CREATININE 9.72*  CALCIUM  8.1*  PHOS 6.6*    Recent Labs  Lab 01/31/24 0320 02/01/24 0300 02/02/24 0700  WBC 8.2 9.3 8.6  HGB 8.8* 9.3* 9.1*  HCT 28.0* 29.7* 28.3*  MCV 115.7* 117.4* 114.1*  PLT 212 179 171    Scheduled Meds:  apixaban   2.5 mg Oral BID   Chlorhexidine  Gluconate Cloth  6 each Topical Q0600   dorzolamide   1 drop Right Eye QHS   fludrocortisone   0.1 mg Oral Daily   fluticasone  furoate-vilanterol  1 puff Inhalation Daily   hydrocortisone   10 mg Oral QPM   hydrocortisone   20 mg Oral Daily   insulin  aspart  0-15 Units Subcutaneous TID WC   midodrine   30 mg Oral TID   pantoprazole   40 mg Oral Daily   timolol   1 drop Both Eyes BID   Continuous Infusions:  anticoagulant sodium citrate      norepinephrine  (LEVOPHED ) Adult infusion Stopped (02/01/24 0905)   PRN Meds:.alteplase , anticoagulant sodium citrate , feeding supplement (NEPRO CARB STEADY), heparin , HYDROmorphone  (DILAUDID ) injection, ipratropium-albuterol , lidocaine  (PF), lidocaine -prilocaine , ondansetron  (ZOFRAN ) IV, mouth rinse, oxyCODONE , pentafluoroprop-tetrafluoroeth   Jayson Player,  MD 02/02/2024, 10:14 AM

## 2024-02-02 NOTE — Progress Notes (Signed)
 Nutrition Education Note  RD consulted for nutrition education regarding Nutrition Management for recurrent bowel obstructions.  Met with pt at bedside and reviewed typical intake. Pt reports that he has cereal for breakfast with milk (cheerios or cinnamon toast crunch), can't recall what he has for lunch but states he doesn't eat sandwiches, and for dinner he has pasta. Pt reports that he has had his ileostomy for 12 years and has had two obstructions since placement.   Discussed with patient that at this point, since his ileostomy is well established and has been in place for years, there is no specific diet he needs to follow to avoid SBO. Did discuss that he is at high risk for developing recurrent SBO as he has had several abdominal surgeries and scar tissue does increase risk. Did provide an ileostomy handout that outlined foods which could potentially cause an obstruction, mainly skins of fruits and vegetables high in insoluable fiber but advised that for most people with chronic ileostomies these foods still could be eaten in small amounts as long as they were chewed well and di not cause discomfort afterwards.   RD provided both Ileostomy Nutrition Therapy handout from the [employer]. This handout is primarily for patients who have recently received an ileostomy and gives guidance for the first 6-8 weeks post-op. Did encourage pt to try and eat enough fiber in his diet to encourage regular/frequent bowel movements tand to also get as much physical activity as he is able as this also encourages healthy mobility of the small bowel  Pt verbalizes understanding of information provided. Expect good compliance.  Current diet order is soft, patient is consuming approximately 93% of meals at this time. Labs and medications reviewed. No further nutrition interventions warranted at this time. RD contact information provided. If additional nutrition issues arise, please re-consult  Rd.  Vernell Lukes, RD, LDN, CNSC Registered Dietitian II Please reach out via secure chat

## 2024-02-02 NOTE — Progress Notes (Signed)
 Received patient in bed to unit.  Alert and oriented.  Informed consent signed and in chart.   TX duration:2 hours and 45 minutes  Patient had low BP that prevented patient hitting his personal goal of 1.5L Transported back to the room  Alert, without acute distress.  Hand-off given to patient's nurse.   Access used: Left Femoral Graft Access issues: none  Total UF removed: 1L Medication(s) given: Albumin  x2, see MAR   02/02/24 1107  Vitals  Temp 97.8 F (36.6 C)  Temp Source Oral  BP (!) 79/59  MAP (mmHg) 66  Pulse Rate 78  ECG Heart Rate 74  Resp 19  MEWS COLOR  MEWS Score Color Yellow  Oxygen Therapy  SpO2 91 %  O2 Device Room Air  Height and Weight  Weight 79 kg  Type of Weight Post-Dialysis  BMI (Calculated) 26.49  MEWS Score  MEWS Temp 0  MEWS Systolic 2  MEWS Pulse 0  MEWS RR 0  MEWS LOC 0  MEWS Score 2     Camellia Brasil LPN [facility]

## 2024-02-02 NOTE — Progress Notes (Signed)
 OT Cancellation Note  Patient Details Name: Joshua Riley MRN: [id number] DOB: 12-04-1935   Cancelled Treatment:    Reason Eval/Treat Not Completed: Patient at procedure or test/ unavailable (Imminent DC order noted. Pt currently receiving HD. Will attempt at a later time.)  [facility] 02/02/2024, 9:23 AM Kreg Sink, OT/L   Acute OT Clinical Specialist Acute Rehabilitation Services Pager [phone] Office [phone]

## 2024-02-02 NOTE — Progress Notes (Signed)
 PT Cancellation Note  Patient Details Name: Joshua Riley MRN: [id number] DOB: 04-Jan-1936   Cancelled Treatment:    Reason Eval/Treat Not Completed: Patient at procedure or test/unavailable. Pt is currently receiving hemodialysis. PT will attempt to follow up as time allows.   [name] 02/02/2024, 9:19 AM

## 2024-02-02 NOTE — Evaluation (Signed)
 Occupational Therapy Evaluation Patient Details Name: Joshua Riley MRN: [id number] DOB: 1935-08-03 Today's Date: 02/02/2024   History of Present Illness   88 y.o. male presents to [facility] hospital on 01/25/2024 with SBO.Pt also noted to have a pericardial effusion on CT scan. PMH - ESRD on HD, afib, CVA, depression, colectomy, ileostomy, chronic hypotension, DVT, acute pericarditis, groin abscess with lt transgluteal drain, nephrostomy tubes, chf, copd     Clinical Impressions PTA, pt lived alone and was mod I for BADL and IADL within his home; daughter recently providing transportation to/from grocery store and pt receiving transportation via insurance for dialysis appts, although pt does report driving on occasion. Upon eval, pt performing UB ADL with set-up and LB ADL with up to CGA. Pt presenting with decr activity tolerance and strength. Recommending discharge home with HHOT.        If plan is discharge home, recommend the following:   A little help with walking and/or transfers;A little help with bathing/dressing/bathroom;Assistance with cooking/housework;Assist for transportation;Help with stairs or ramp for entrance     Functional Status Assessment   Patient has had a recent decline in their functional status and demonstrates the ability to make significant improvements in function in a reasonable and predictable amount of time.     Equipment Recommendations   None recommended by OT     Recommendations for Other Services         Precautions/Restrictions   Precautions Precautions: Fall Recall of Precautions/Restrictions: Intact Precaution/Restrictions Comments: ostomy,   L thigh fistula, L nephrostomy tube, L posterior JP drain Restrictions Weight Bearing Restrictions Per Provider Order: No     Mobility Bed Mobility Overal bed mobility: Needs Assistance Bed Mobility: Supine to Sit     Supine to sit: Supervision, HOB elevated           Transfers Overall transfer level: Needs assistance Equipment used: Rolling walker (2 wheels) Transfers: Sit to/from Stand Sit to Stand: Contact guard assist, From elevated surface           General transfer comment: for safety      Balance Overall balance assessment: Needs assistance Sitting-balance support: No upper extremity supported, Feet supported Sitting balance-Leahy Scale: Good     Standing balance support: Single extremity supported, Reliant on assistive device for balance Standing balance-Leahy Scale: Poor                             ADL either performed or assessed with clinical judgement   ADL Overall ADL's : Needs assistance/impaired Eating/Feeding: Independent   Grooming: Oral care;Supervision/safety;Standing   Upper Body Bathing: Set up;Sitting   Lower Body Bathing: Contact guard assist;Sit to/from stand   Upper Body Dressing : Set up;Sitting   Lower Body Dressing: Set up;Sitting/lateral leans Lower Body Dressing Details (indicate cue type and reason): donning socks Toilet Transfer: Contact guard assist;Ambulation           Functional mobility during ADLs: Contact guard assist (within room)       Vision Ability to See in Adequate Light: 0 Adequate Patient Visual Report: No change from baseline Additional Comments: denies acute changes, not formally assessed, but able to locate items in room on L and R sides of environment.     Perception         Praxis         Pertinent Vitals/Pain Pain Assessment Pain Assessment: No/denies pain     Extremity/Trunk Assessment Upper Extremity Assessment Upper  Extremity Assessment: Overall WFL for tasks assessed   Lower Extremity Assessment Lower Extremity Assessment: Defer to PT evaluation   Cervical / Trunk Assessment Cervical / Trunk Assessment: Kyphotic   Communication Communication Communication: Impaired Factors Affecting Communication: Hearing impaired   Cognition  Arousal: Alert Behavior During Therapy: WFL for tasks assessed/performed Cognition: No apparent impairments             OT - Cognition Comments: follows commands, is oriented, aware of information MD provided just before session                 Following commands: Intact       Cueing  General Comments   Cueing Techniques: Verbal cues  VSS on RA; chronic hypotension at baseline   Exercises     Shoulder Instructions      Home Living Family/patient expects to be discharged to:: Private residence Living Arrangements: Alone Available Help at Discharge: Family;Available PRN/intermittently Type of Home: Apartment Home Access: Level entry     Home Layout: One level     Bathroom Shower/Tub: Producer, television/film/video: Standard Bathroom Accessibility: Yes   Home Equipment: Wheelchair - Forensic psychologist (2 wheels);Rollator (4 wheels);Shower seat;Hand held shower head          Prior Functioning/Environment Prior Level of Function : Independent/Modified Independent;Driving             Mobility Comments: PRN use of rollator per report, but does not sound like he uses often at all ADLs Comments: independent, intermittently drives, but has been receiving ride to dialysis via his insurance as well as daughter taking pt to grocery store    OT Problem List: Decreased strength;Impaired balance (sitting and/or standing);Decreased activity tolerance   OT Treatment/Interventions: Therapeutic exercise;Self-care/ADL training;DME and/or AE instruction;Therapeutic activities;Patient/family education;Balance training      OT Goals(Current goals can be found in the care plan section)   Acute Rehab OT Goals Patient Stated Goal: get home OT Goal Formulation: With patient Time For Goal Achievement: 02/16/24 Potential to Achieve Goals: Good   OT Frequency:  Min 2X/week    Co-evaluation              AM-PAC OT 6 Clicks Daily Activity     Outcome  Measure Help from another person eating meals?: None Help from another person taking care of personal grooming?: A Little Help from another person toileting, which includes using toliet, bedpan, or urinal?: A Little Help from another person bathing (including washing, rinsing, drying)?: A Little Help from another person to put on and taking off regular upper body clothing?: A Little Help from another person to put on and taking off regular lower body clothing?: A Little 6 Click Score: 19   End of Session Nurse Communication: Mobility status  Activity Tolerance: Patient tolerated treatment well Patient left: in chair;with call bell/phone within reach;with chair alarm set  OT Visit Diagnosis: Unsteadiness on feet (R26.81);Muscle weakness (generalized) (M62.81)                Time: 1355-1416 OT Time Calculation (min): 21 min Charges:  OT General Charges $OT Visit: 1 Visit OT Evaluation $OT Eval Low Complexity: 1 Low  Elma JONETTA Lebron FREDERICK, OTR/L [facility] Acute Rehabilitation Office: [phone]   Elma JONETTA Lebron 02/02/2024, 3:00 PM

## 2024-02-03 ENCOUNTER — Other Ambulatory Visit (HOSPITAL_COMMUNITY): Payer: Self-pay

## 2024-02-03 DIAGNOSIS — Z992 Dependence on renal dialysis: Secondary | ICD-10-CM | POA: Diagnosis not present

## 2024-02-03 DIAGNOSIS — I3139 Other pericardial effusion (noninflammatory): Secondary | ICD-10-CM | POA: Diagnosis not present

## 2024-02-03 DIAGNOSIS — K56609 Unspecified intestinal obstruction, unspecified as to partial versus complete obstruction: Secondary | ICD-10-CM | POA: Diagnosis not present

## 2024-02-03 DIAGNOSIS — N186 End stage renal disease: Secondary | ICD-10-CM | POA: Diagnosis not present

## 2024-02-03 LAB — GLUCOSE, CAPILLARY
Glucose-Capillary: 98 mg/dL (ref 70–99)
Glucose-Capillary: 109 mg/dL — ABNORMAL HIGH (ref 70–99)

## 2024-02-03 MED ORDER — HYDROCORTISONE 10 MG PO TABS
10.0000 mg | ORAL_TABLET | Freq: Every evening | ORAL | 1 refills | Status: DC
  Filled 2024-02-03: qty 90, 30d supply, fill #0

## 2024-02-03 MED ORDER — HYDROCORTISONE 20 MG PO TABS
20.0000 mg | ORAL_TABLET | Freq: Every day | ORAL | 1 refills | Status: DC
  Filled 2024-02-03: qty 30, 30d supply, fill #0

## 2024-02-03 MED ORDER — MIDODRINE HCL 10 MG PO TABS
30.0000 mg | ORAL_TABLET | Freq: Three times a day (TID) | ORAL | 1 refills | Status: DC
  Filled 2024-02-03: qty 270, 30d supply, fill #0
  Filled 2024-03-04 (×2): qty 270, 30d supply, fill #1
  Filled 2024-03-04: qty 270, 30d supply, fill #0

## 2024-02-03 NOTE — Progress Notes (Signed)
 Contacted by attending and nephrologist this morning with request for pt to receive out-pt HD at pt's clinic tomorrow. Plans are for pt to d/c today. Contacted [facility] and spoke to charge RN regarding appt tomorrow. Clinic can treat pt tomorrow. Pt needs to arrive at 6:30 am. This info was provided to attending and nephrologist. Attending discussed appt with pt's daughter when d/c plan was discussed. HD appt for tomorrow added to pt's AVS. Clinic advised pt will d/c today and should be at appt tomorrow as discussed with RN.   Randine Mungo Dialysis Navigator [phone]

## 2024-02-03 NOTE — TOC Transition Note (Signed)
 Transition of Care [facility]) - Discharge Note   Patient Details  Name: Joshua Riley MRN: [id number] Date of Birth: Sep 27, 1935  Transition [facility]) CM/SW Contact:  Roxie KANDICE Stain, RN Phone Number: 02/03/2024, 11:49 AM   Clinical Narrative:    Patient stable for discharge. Patient is agreeable to use [facility] for home health needs. Amy with [facility] accepted referral. Patient states his daughter will transport home. Patient has all needed DME. Address, Phone number and PCP verified.    Final next level of care: Home w Home Health Services Barriers to Discharge: Barriers Resolved   Patient Goals and CMS Choice Patient states their goals for this hospitalization and ongoing recovery are:: to return home          Discharge Placement                       Discharge Plan and Services Additional resources added to the After Visit Summary for                            HH Arranged: RN, PT, OT [facility] Agency: [facility] Date [facility] Agency Contacted: 02/03/24 Time HH Agency Contacted: [phone] Representative spoke with at [facility] Agency: Amy  Social Drivers of Health (SDOH) Interventions SDOH Screenings   Food Insecurity: No Food Insecurity (01/27/2024)  Housing: Unknown (01/27/2024)  Transportation Needs: No Transportation Needs (01/27/2024)  Utilities: Not At Risk (01/27/2024)  Alcohol Screen: Low Risk  (10/26/2023)  Depression (PHQ2-9): Low Risk  (12/15/2023)  Financial Resource Strain: Low Risk  (10/26/2023)  Physical Activity: Insufficiently Active (10/26/2023)  Social Connections: Socially Isolated (01/27/2024)  Stress: Stress Concern Present (10/26/2023)  Tobacco Use: Medium Risk (01/25/2024)     Readmission Risk Interventions    12/03/2023   11:30 AM 11/10/2023    3:46 PM 07/21/2023   12:26 PM  Readmission Risk Prevention Plan  Transportation Screening Complete Complete Complete  PCP or Specialist Appt within 5-7 Days  Complete   Home Care  Screening  Complete   Medication Review (RN CM)  Complete   HRI or Home Care Consult   Complete  Social Work Consult for Recovery Care Planning/Counseling   Complete  Palliative Care Screening   Not Applicable  Medication Review [employer])   Complete  SW Recovery Care/Counseling Consult Complete    Palliative Care Screening Complete    Skilled Nursing Facility Complete

## 2024-02-03 NOTE — Progress Notes (Signed)
 Physical Therapy Treatment Patient Details Name: Joshua Riley MRN: [id number] DOB: 12/08/1935 Today's Date: 02/03/2024   History of Present Illness 88 y.o. male [facility] with SBO.Pt also noted to have a pericardial effusion on CT scan. PMH - ESRD on HD, afib, CVA, depression, colectomy, ileostomy, chronic hypotension, DVT, acute pericarditis, groin abscess with lt transgluteal drain, nephrostomy tubes, chf, copd    PT Comments  Pt received sitting up in chair eager to go home. Pt with improved ambulation tolerance this date. Pt remains to require use of rolling walker for safe ambulation vs no AD as he was functioning PTA. Acute PT to cont to follow.    If plan is discharge home, recommend the following: Assistance with cooking/housework;Assist for transportation   Can travel by private vehicle        Equipment Recommendations  None recommended by PT    Recommendations for Other Services       Precautions / Restrictions Precautions Precautions: Fall Recall of Precautions/Restrictions: Intact Precaution/Restrictions Comments: ostomy,   L thigh fistula, L nephrostomy tube, L posterior JP drain Restrictions Weight Bearing Restrictions Per Provider Order: No     Mobility  Bed Mobility               General bed mobility comments: pt received sitting up in chair and returned to chair    Transfers Overall transfer level: Needs assistance Equipment used: Rolling walker (2 wheels) Transfers: Sit to/from Stand Sit to Stand: Contact guard assist, From elevated surface           General transfer comment: verbal cues for hand placement, increased time    Ambulation/Gait Ambulation/Gait assistance: Contact guard assist Gait Distance (Feet): 70 Feet Assistive device: Rolling walker (2 wheels) Gait Pattern/deviations: Step-through pattern Gait velocity: reduced Gait velocity interpretation: 1.31 - 2.62 ft/sec, indicative of limited community  ambulator   General Gait Details: slowed step-through gait, widened BOS, trunk flexion, noted onset of SOB however denies fatigue. Unable to get pulse ox to read, 98% on RA once pulse ox changed to L ear lobe vs finger   Stairs             Wheelchair Mobility     Tilt Bed    Modified Rankin (Stroke Patients Only)       Balance Overall balance assessment: Needs assistance Sitting-balance support: No upper extremity supported, Feet supported Sitting balance-Leahy Scale: Good     Standing balance support: Single extremity supported, Reliant on assistive device for balance Standing balance-Leahy Scale: Fair Standing balance comment: reliant on RW at the moment                            Communication Communication Communication: Impaired Factors Affecting Communication: Hearing impaired  Cognition Arousal: Alert Behavior During Therapy: WFL for tasks assessed/performed   PT - Cognitive impairments: No apparent impairments                         Following commands: Intact      Cueing Cueing Techniques: Verbal cues  Exercises      General Comments General comments (skin integrity, edema, etc.): VSS, drains intact, BP 105/78      Pertinent Vitals/Pain Pain Assessment Pain Assessment: No/denies pain    Home Living  Prior Function            PT Goals (current goals can now be found in the care plan section) Acute Rehab PT Goals Patient Stated Goal: to return home ASAP PT Goal Formulation: With patient Time For Goal Achievement: 02/16/24 Potential to Achieve Goals: Good Progress towards PT goals: Progressing toward goals    Frequency    Min 2X/week      PT Plan      Co-evaluation              AM-PAC PT 6 Clicks Mobility   Outcome Measure  Help needed turning from your back to your side while in a flat bed without using bedrails?: A Little Help needed moving from lying on  your back to sitting on the side of a flat bed without using bedrails?: A Little Help needed moving to and from a bed to a chair (including a wheelchair)?: A Little Help needed standing up from a chair using your arms (e.g., wheelchair or bedside chair)?: A Little Help needed to walk in hospital room?: A Little Help needed climbing 3-5 steps with a railing? : Total 6 Click Score: 16    End of Session   Activity Tolerance: Patient tolerated treatment well Patient left: in chair;with call bell/phone within reach Nurse Communication: Mobility status PT Visit Diagnosis: Other abnormalities of gait and mobility (R26.89);Muscle weakness (generalized) (M62.81)     Time: 9167-9147 PT Time Calculation (min) (ACUTE ONLY): 20 min  Charges:    $Gait Training: 8-22 mins PT General Charges $$ ACUTE PT VISIT: 1 Visit                     Norene Ames, PT, DPT Acute Rehabilitation Services Secure chat preferred Office #: [phone]    Norene CHRISTELLA Ames 02/03/2024, 9:56 AM

## 2024-02-03 NOTE — Discharge Summary (Signed)
 Physician Discharge Summary   Patient ID: Kristan Votta [id number] 87 y.o. 08/05/1935  Admit date: 01/25/2024  Discharge date and time: 02/03/2024 10 AM  Admitting Physician: Marsha Ada, MD   Discharge Physician: [name], MD  Admission Diagnoses: Pericardial effusion [I31.39] Small bowel obstruction (HCC) [K56.609] SBO (small bowel obstruction) (HCC) [K56.609] ESRD on hemodialysis (HCC) [N18.6, Z99.2] Sepsis without acute organ dysfunction, due to unspecified organism (HCC) [A41.9] Hypotension [I95.9]  Discharge Diagnoses: Principal Problem:   Small bowel obstruction (HCC) Active Problems:   Hypotension   Septic shock (HCC)   ESRD on hemodialysis [facility])   Admission Condition: critical  Discharged Condition: fair  Indication for Admission: SBO  Hospital Course: Patient presented with abdominal pain and nausea vomiting.  Found to have SBO.  He was treated conservatively.  He was hypotensive.  Acute on chronic.  Was placed on antibiotics for possible intra-abdominal source.  Later felt more related to his baseline hypotension.  He was relatively asymptomatic.  But blood pressure readings were lower than his typical SBP 80s.  He was on IV hydrocortisone  then this was stopped.  His midodrine  was increased to 30 mg 3 times daily.  He was continued on home Florinef , known adrenal insufficiency.  Not on hydrocortisone .  Oral hydrocortisone  was added 7/28.  Blood pressure subsequently improved.  He tolerated dialysis without additional hypotension.  He worked with PT and OT.  Ambulated around the unit.  Discussed at length with daughter.  Agreeable for home discharge.  Arrange additional HD session 7/31 then to resume normal HD session 8/1.  New medications, hydrocortisone  and increased dose midodrine  were delivered to bedside.  Consults: As per EMR  Significant Diagnostic Studies: As per EMR  Treatments: As per EMR   Disposition: Discharge disposition: 01-Home or Self  Care       Patient Instructions:  Allergies as of 02/03/2024       Reactions   Baclofen Other (See Comments)   Altered mental status, after accidental overdose   Cephalosporins Rash   Ciprofloxacin Itching, Rash        Medication List     TAKE these medications    albuterol  108 (90 Base) MCG/ACT inhaler Commonly known as: VENTOLIN  HFA Inhale 1-2 puffs into the lungs every 6 (six) hours as needed for wheezing or shortness of breath.   albuterol  (2.5 MG/3ML) 0.083% nebulizer solution Commonly known as: PROVENTIL  Take 2.5 mg by nebulization every 6 (six) hours as needed for wheezing or shortness of breath.   atorvastatin  20 MG tablet Commonly known as: LIPITOR Take 1 tablet (20 mg total) by mouth daily.   budesonide  0.5 MG/2ML nebulizer solution Commonly known as: PULMICORT  Take 0.5 mg by nebulization 2 (two) times daily as needed (for respiratory flares).   calcitRIOL  0.5 MCG capsule Commonly known as: ROCALTROL  Take 1 capsule (0.5 mcg total) by mouth every Monday, Wednesday, and Friday with hemodialysis.   dorzolamide  2 % ophthalmic solution Commonly known as: TRUSOPT  Place 1 drop into the right eye at bedtime.   Eliquis  2.5 MG Tabs tablet Generic drug: apixaban  Take 1 tablet (2.5 mg total) by mouth 2 (two) times daily.   fludrocortisone  0.1 MG tablet Commonly known as: FLORINEF  Take 1 tablet (0.1 mg total) by mouth daily.   folic acid  1 MG tablet Commonly known as: FOLVITE  Take 1 tablet (1 mg total) by mouth daily.   gabapentin  300 MG capsule Commonly known as: NEURONTIN  Take 300 mg by mouth at bedtime.   hydrocortisone  10 MG  tablet Commonly known as: CORTEF  Take 2 tablets (20 mg total) by mouth daily and take 1 tablet (10 mg total) by mouth every evening.   hydrocortisone  20 MG tablet Commonly known as: CORTEF  Take 1 tablet (20 mg total) by mouth daily. Start taking on: February 04, 2024   loperamide  2 MG capsule Commonly known as: IMODIUM  Take 1  capsule (2 mg total) by mouth as needed for diarrhea or loose stools.   midodrine  10 MG tablet Commonly known as: PROAMATINE  Take 3 tablets (30 mg total) by mouth 3 (three) times daily. What changed:  how much to take when to take this   mometasone -formoterol  200-5 MCG/ACT Aero Commonly known as: DULERA  Inhale 2 puffs into the lungs 2 (two) times daily.   multivitamin Tabs tablet Take 1 tablet by mouth daily.   Omega-3 Gummies Chew Chew 1 tablet by mouth in the morning and at bedtime.   pantoprazole  40 MG tablet Commonly known as: PROTONIX  Take 1 tablet (40 mg total) by mouth 2 (two) times daily.   ramelteon  8 MG tablet Commonly known as: ROZEREM  Take 1 tablet (8 mg total) by mouth at bedtime.   Saline Flush 0.9 % Soln Use 5 mLs by Intracatheter route daily as directed.   sertraline  50 MG tablet Commonly known as: ZOLOFT  Take 25 mg by mouth at bedtime.   sevelamer  carbonate 800 MG tablet Commonly known as: RENVELA  Take 1,600 mg by mouth 3 (three) times daily with meals.   timolol  0.5 % ophthalmic solution Commonly known as: TIMOPTIC  Place 1 drop into both eyes in the morning and at bedtime.   traMADol  50 MG tablet Commonly known as: ULTRAM  Take 1 tablet (50 mg total) by mouth every 8 (eight) hours as needed (for pain).   Tylenol  325 MG tablet Generic drug: acetaminophen  Take 325-650 mg by mouth every 6 (six) hours as needed for mild pain (pain score 1-3) or headache.   VITAMIN B-12 PO Take 1 capsule by mouth in the morning and at bedtime.       Activity: activity as tolerated Diet: regular diet Wound Care: as directed  Follow-up with PCP in 1-2 weeks.  SignedBETHA Donnice SAUNDERS Chrstopher Malenfant 02/03/2024 12:21 PM

## 2024-02-03 NOTE — Progress Notes (Signed)
 [facility] Progress Note  Subjective:  Patient states he feels well today.  Tolerated dialysis yesterday with no issues.  Vitals:   02/03/24 0824 02/03/24 0900 02/03/24 1000 02/03/24 1107  BP:  99/72 (!) 86/65   Pulse:  86 67   Resp:  (!) 22 17   Temp:    97.6 F (36.4 C)  TempSrc:    Oral  SpO2: 95% 94% (!) 89%   Weight:      Height:        Exam: Gen alert, no distress, sitting in chair No jvd or bruits Bilateral chest rise no increased work of breathing Normal rate with no rubs Abd soft ntnd no mass or ascites +bs, RLQ ostomy bag Trace lower extremity edema Neuro is alert, Ox 3 , nf    L thigh AVG +bruit    Home bp meds: Florinef  0.1mg  every day Midodrine  20mg  tid    OP HD: [facility] MWF 2h  B350   71.5kg   2K bath   AVG   Heparin  none Rocaltrol  0.5 mcg po three times per week Mircera 225 mcg IV q 2 wks, last 7/21, due 8/04 Last OP HD 7/21, post wt 73.1kg     Assessment/ Plan: SBO: Overall improved.  Management per surgery Hypotension: acute on chronic, usual BP's at OP HD are in the 90s-100s. Home midodrine  20mg  tid and Florinef .  Now off pressors.  Using hydrocortisone  as well.  Overall will be a long-term complicating factor and may not have a lot of options.  But no need to remain in the hospital at this time.  Ongoing goals of care conversations in the outpatient setting. ESRD: Got dialysis on Tuesday off schedule.  We arrange for outpatient dialysis tomorrow as well as his regular Friday. 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 Follow.  L percutaneous nephrostomy tube: exit site is clean, not tender, f/b IR as outpt Recurrent presacral fluid collection drain: f/b IR outpatient Hyponatremia: Volume removal with dialysis as tolerated    02/03/2024, 11:18 AM  Recent Labs  Lab 01/31/24 0320 02/01/24 0300 02/02/24 0540 02/02/24 0700  HGB 8.8* 9.3*  --  9.1*  ALBUMIN   --   --   --  3.6  CALCIUM  8.0*  8.1*  --  8.1*  PHOS 7.0*  --   --  6.6*  CREATININE 6.82* 8.59* 9.83* 9.72*  K 4.4 4.2  --  4.4   No results for input(s): IRON, TIBC, FERRITIN in the last 168 hours. Inpatient medications:  apixaban   2.5 mg Oral BID   Chlorhexidine  Gluconate Cloth  6 each Topical Q0600   dorzolamide   1 drop Right Eye QHS   fludrocortisone   0.1 mg Oral Daily   fluticasone  furoate-vilanterol  1 puff Inhalation Daily   hydrocortisone   10 mg Oral QPM   hydrocortisone   20 mg Oral Daily   insulin  aspart  0-15 Units Subcutaneous TID WC   midodrine   30 mg Oral TID   pantoprazole   40 mg Oral Daily   timolol   1 drop Both Eyes BID    anticoagulant sodium citrate      norepinephrine  (LEVOPHED ) Adult infusion Stopped (02/01/24 0905)   alteplase , anticoagulant sodium citrate , feeding supplement (NEPRO CARB STEADY), heparin , HYDROmorphone  (DILAUDID ) injection, ipratropium-albuterol , lidocaine  (PF), lidocaine -prilocaine , ondansetron  (ZOFRAN ) IV, mouth rinse, oxyCODONE , pentafluoroprop-tetrafluoroeth

## 2024-02-03 NOTE — Progress Notes (Signed)
 TOC meds delivered to patient's room. Patient's RN is aware and approved leaving in the patient's room.

## 2024-02-04 ENCOUNTER — Telehealth: Payer: Self-pay | Admitting: *Deleted

## 2024-02-04 NOTE — TOC Transition Note (Signed)
 Transition of Care - Initial Contact after Hospitalization  Date of discharge: 02/03/2024  Date of contact: 02/04/24  Method: Phone Spoke to: Patient's daughter  Patient's daughter contacted to discuss transition of care from recent inpatient hospitalization. Patient was admitted to [facility] from 01/25/24 to 02/02/34 with discharge diagnosis of: Pericardial effusion [I31.39] Small bowel obstruction (HCC) [K56.609] SBO (small bowel obstruction) (HCC) [K56.609] ESRD on hemodialysis (HCC) [N18.6, Z99.2] Sepsis without acute organ dysfunction, due to unspecified organism (HCC) [A41.9] Hypotension [I95.9] .  The discharge medication list was reviewed. Patient's daughter understands the changes and has no concerns.   Patient will return to his outpatient HD unit on: Friday 02/05/24 at [facility].  Charmaine Piety, NP

## 2024-02-04 NOTE — Discharge Planning (Signed)
 [facility] Patient Discharge Orders- [facility] CLINIC: [facility]  Patient's name: Joshua Riley Admit/DC Dates: 01/25/2024 - 02/03/2024  Discharge Diagnoses: SBO - conservative management. Overall improved  Chronic hypotension - On Midodrine  and Florinef  L percutaneous nephrostomy tube: f/b IR as outpt  Recurrent presacral fluid collection drain: f/b IR outpatient   Aranesp : Given: No    Last Hgb: 9.1 PRBC's Given: No  ESA dose for discharge: Continue mircera 225 mcg IV q 2 weeks  IV Iron dose at discharge: N/A  Heparin  change: N/A  EDW Change: No  Bath Change: No  Access intervention/Change: No  Calcitriol  change: No  Discharge Labs: Calcium  8.1  Phosphorus 6.6  Albumin  3.6  K+ 4.4  IV Antibiotics: No  On Coumadin?: No   OTHER/APPTS/LAB ORDERS:    D/C Meds to be reconciled by nurse after every discharge.  Completed By: Charmaine Piety, NP   Reviewed by: MD:______ RN_______

## 2024-02-04 NOTE — Transitions of Care (Post Inpatient/ED Visit) (Signed)
   02/04/2024  Name: [name] MRN: [id number] DOB: 1935/07/17  Today's TOC FU Call Status: Today's TOC FU Call Status:: Unsuccessful Call (1st Attempt) Unsuccessful Call (1st Attempt) Date: 02/04/24  Attempted to reach the patient regarding the most recent Inpatient/ED visit.  Follow Up Plan: Additional outreach attempts will be made to reach the patient to complete the Transitions of Care (Post Inpatient/ED visit) call.   Mliss Creed [facility], BSN RN Care Manager/ Transition of Care [place] [phone]

## 2024-02-05 ENCOUNTER — Telehealth: Payer: Self-pay | Admitting: *Deleted

## 2024-02-05 NOTE — Transitions of Care (Post Inpatient/ED Visit) (Signed)
 02/05/2024  Name: Joshua Riley MRN: [id number] DOB: Jun 02, 1936  Today's TOC FU Call Status: Today's TOC FU Call Status:: Successful TOC FU Call Completed TOC FU Call Complete Date: 02/05/24 Patient's Name and Date of Birth confirmed.  Transition Care Management Follow-up Telephone Call Date of Discharge: 02/03/24 Discharge Facility: [name]) Type of Discharge: Inpatient Admission Primary Inpatient Discharge Diagnosis:: Small bowel obstruction How have you been since you were released from the hospital?: Better (appetite good, no issues with bowel / bladder, ileostomy patent, no concerns) Any questions or concerns?: No  Items Reviewed: Did you receive and understand the discharge instructions provided?: Yes Medications obtained,verified, and reconciled?: No (unable to review, daugher is at work, cannot talk further, requests call 02/08/24) Medications Not Reviewed Reasons:: Other: (daughter is at work unable to talk further, will outreach on 02/08/24 to complete) Any new allergies since your discharge?: No Dietary orders reviewed?: Yes Type of Diet Ordered:: renal, heart healthy Do you have support at home?: Yes People in Home [RPT]: alone Name of Support/Comfort Primary Source: adult daughter [name] Brooke Unable to complete today's assessment, review of systems, medication reconciliation due to unable to speak with pt and patient's daughter is at work and cannot finish conversation today, requests a call back on 02/08/24 (her day off)  Medications Reviewed Today: Medications Reviewed Today   Medications were not reviewed in this encounter     Home Care and Equipment/Supplies: Were Home Health Services Ordered?: Yes Name of Home Health Agency:: [facility] Has Agency set up a time to come to your home?:  (unable to assess, daughter ended the call) Any new equipment or medical supplies ordered?: No  Functional Questionnaire: Do you need assistance with bathing/showering or  dressing?: No Do you need assistance with meal preparation?: No Do you need assistance with eating?: No Do you have difficulty maintaining continence: No (dialysis 3 x week) Do you need assistance with getting out of bed/getting out of a chair/moving?: Yes (walker, cane) Do you have difficulty managing or taking your medications?: Yes (daughter provides oversight)  Follow up appointments reviewed: PCP Follow-up appointment confirmed?: Yes Date of PCP follow-up appointment?: 02/11/24 Follow-up Provider: Garnette Lukes MD  @ 3 pm Specialist Hospital Follow-up appointment confirmed?: Yes Date of Specialist follow-up appointment?: 02/18/24 Follow-Up Specialty Provider:: cardiologist  @ 1055 am Do you need transportation to your follow-up appointment?: No Do you understand care options if your condition(s) worsen?: Yes-patient verbalized understanding  SDOH Interventions Today    Flowsheet Row Most Recent Value  SDOH Interventions   Food Insecurity Interventions Intervention Not Indicated  Housing Interventions Intervention Not Indicated  Transportation Interventions Intervention Not Indicated  Utilities Interventions Intervention Not Indicated    Goals Addressed             This Visit's Progress    VBCI Transitions of Care (TOC) Care Plan       Unable to maintain contact Problems:  Recent Hospitalization for treatment of Small Bowel Obstruction Per daughter, Joshua Riley (DPR), pt lives alone, she comes in daily to check on patient, pt goes to dialysis 3 x week, [facility] home health will be working with pt, unable to discuss with daughter whether home health has started services, she states she is aware as they worked with pt before, pt is independent with care of ileostomy, has walker and cane, unable to complete review of systems, medication reconciliation, home health services due to daughter is at work and had to end the call, she is off  on Monday and requests a call back on  02/08/24.  Goal:  Over the next 30 days, the patient will not experience hospital readmission  Interventions:  Evaluation of current treatment plan related to small bowel obstruction, self-management and patient's adherence to plan as established by provider. Discussed plans with patient for ongoing care management follow up and provided patient with direct contact information for care management team Evaluation of current treatment plan related to small bowel obstruction and patient's adherence to plan as established by provider Reviewed scheduled/upcoming provider appointments including primary care provider 02/11/24 @ 3 pm, cardiologist 02/18/24 @ 1055 am Discussed plans with patient for ongoing care management follow up and provided patient with direct contact information for care management team Assessed social determinant of health barriers Reviewed signs/ symptoms small bowel obstruction, reportable signs/ symptoms and any issues with ileostomy Reviewed plan of care with daughter and call scheduled for 02/08/24 to finish assessment/ review of systems, medication reconciliation, daughter agreeable to enrollment in [facility] 30 day program  Patient Self Care Activities:  Attend all scheduled provider appointments Attend church or other social activities Call pharmacy for medication refills 3-7 days in advance of running out of medications Call provider office for new concerns or questions  Notify RN Care Manager of TOC call rescheduling needs Participate in Transition of Care Program/Attend TOC scheduled calls Take medications as prescribed    Plan:  Telephone follow up appointment with care management team member scheduled for:  02/08/24 @ 215 pm          Mliss Creed [facility], BSN RN Care Manager/ Transition of Care [place]/ [facility] Population Health [phone]

## 2024-02-08 ENCOUNTER — Other Ambulatory Visit: Payer: Self-pay | Admitting: *Deleted

## 2024-02-08 NOTE — Patient Outreach (Addendum)
 Transition of Care week 2  Visit Note  02/08/2024  Name: Joshua Riley MRN: [id number]          DOB: 03-May-1936  Situation: Patient enrolled in [facility] 30-day program. Visit completed with patient by telephone.   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   lyrica     Ileostomy in place [facility])    Obstructive uropathy    With chronic left nephrostomy   Old retinal detachment, total or subtotal    Orthostatic hypotension 10/22/2021   Sleep apnea    does not need a machine   Stroke (HCC)    Ulcerative colitis (HCC)    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    Assessment: Patient Reported Symptoms: Cognitive Cognitive Status: No symptoms reported, Able to follow simple commands, Alert and oriented to person, place, and time   Healing Pattern: Unsure Health Facilitated by: Rest  Neurological Neurological Review of Symptoms: No symptoms reported    HEENT HEENT Symptoms Reported: No symptoms reported      Cardiovascular Cardiovascular Symptoms Reported: No symptoms reported    Respiratory Respiratory Symptoms Reported: No symptoms reported    Endocrine Endocrine Symptoms Reported: No symptoms reported Is patient diabetic?: No    Gastrointestinal Gastrointestinal Symptoms Reported: Other Other Gastrointestinal Symptoms: ileostomy- no concerns reported Additional Gastrointestinal Details: pt is independent with care of ileostomy Gastrointestinal Management Strategies: Adequate rest, Ileostomy Gastrointestinal Self-Management Outcome: 4 (good)    Genitourinary Genitourinary Symptoms Reported: Other Other Genitourinary  Symptoms: dialysis 3 x week Additional Genitourinary Details: reviewed renal diet Genitourinary Management Strategies: Medication therapy, Hemodialysis, Diet modification Genitourinary Self-Management Outcome: 4 (good)  Integumentary Integumentary Symptoms Reported: No symptoms reported    Musculoskeletal Musculoskelatal Symptoms Reviewed: Difficulty walking Additional Musculoskeletal Details: cane and walker Musculoskeletal Management Strategies: Medical device Musculoskeletal Self-Management Outcome: 4 (good) Falls in the past year?: No Fall risk Follow up: Falls evaluation completed, Falls prevention discussed  Psychosocial Psychosocial Symptoms Reported: No symptoms reported   Major Change/Loss/Stressor/Fears (CP): Denies Quality of Family Relationships: helpful, supportive   There were no vitals filed for this visit.  Medications Reviewed Today     Reviewed by Aura Mliss LABOR, RN (Registered Nurse) on 02/08/24 at 1442  Med List Status: <None>   Medication Order Taking? Sig Documenting Provider Last Dose Status Informant  albuterol  (PROVENTIL ) (2.5 MG/3ML) 0.083% nebulizer solution [id number]  Take 2.5 mg by nebulization every 6 (six) hours as needed for wheezing or shortness of breath.  Patient not taking: Reported on 02/08/2024   [provider]  Active   albuterol  (VENTOLIN  HFA) 108 (90 Base) MCG/ACT inhaler [id number] Yes Inhale 1-2 puffs into the lungs every 6 (six) hours as needed for wheezing or shortness of breath. [provider]  Active   atorvastatin  (LIPITOR) 20 MG tablet [id number] Yes Take 1 tablet (20 mg total) by mouth daily. Sebastian Toribio GAILS, MD  Active   budesonide  (PULMICORT ) 0.5 MG/2ML nebulizer solution [id number]  Take 0.5 mg by nebulization 2 (two) times daily as needed (for respiratory  flares).  Patient not taking: Reported on 02/08/2024   [provider]  Active   calcitRIOL  (ROCALTROL ) 0.5 MCG capsule [id number] Yes Take 1 capsule (0.5  mcg total) by mouth every Monday, Wednesday, and Friday with hemodialysis. Jonel Lonni SQUIBB, MD  Active   Cyanocobalamin (VITAMIN B-12 PO) [id number] Yes Take 1 capsule by mouth in the morning and at bedtime. [provider]  Active   dorzolamide  (TRUSOPT ) 2 % ophthalmic solution [id number] Yes Place 1 drop into the right eye at bedtime. [provider]  Active            Med Note STEFFI NIAN   Sat Nov 28, 2023 12:49 PM)    ELIQUIS  2.5 MG TABS tablet [id number] Yes Take 1 tablet (2.5 mg total) by mouth 2 (two) times daily. Hobart Powell BRAVO, MD  Active            Med Note ELSWORTH, JACQUELINE L   Thu Sep 03, 2023 11:18 AM)    fludrocortisone  (FLORINEF ) 0.1 MG tablet [id number]  Take 1 tablet (0.1 mg total) by mouth daily. Jonel Lonni SQUIBB, MD  Active   folic acid  (FOLVITE ) 1 MG tablet [id number]  Take 1 tablet (1 mg total) by mouth daily. Regalado, Belkys A, MD  Active   gabapentin  (NEURONTIN ) 300 MG capsule [id number] Yes Take 300 mg by mouth at bedtime. [provider]  Active   hydrocortisone  (CORTEF ) 10 MG tablet [id number] Yes Take 2 tablets (20 mg total) by mouth daily and take 1 tablet (10 mg total) by mouth every evening. Hunsucker, Donnice SAUNDERS, MD  Active   hydrocortisone  (CORTEF ) 20 MG tablet [id number] Yes Take 1 tablet (20 mg total) by mouth daily. Hunsucker, Donnice SAUNDERS, MD  Active   loperamide  (IMODIUM ) 2 MG capsule [id number] Yes Take 1 capsule (2 mg total) by mouth as needed for diarrhea or loose stools. Sebastian Toribio GAILS, MD  Active   midodrine  (PROAMATINE ) 10 MG tablet [id number] Yes Take 3 tablets (30 mg total) by mouth 3 (three) times daily. Hunsucker, Donnice SAUNDERS, MD  Active   mometasone -formoterol  (DULERA ) 200-5 MCG/ACT AERO [id number] Yes Inhale 2 puffs into the lungs 2 (two) times daily. Rosario Leatrice FERNS, MD  Active   multivitamin (RENA-VIT) TABS tablet [id number] Yes Take 1 tablet by mouth daily. [provider]  Active   Omega  Fatty Acids-Vitamins (OMEGA-3 GUMMIES) CHEW [id number] Yes Chew 1 tablet by mouth in the morning and at bedtime. [provider]  Active   pantoprazole  (PROTONIX ) 40 MG tablet [id number] Yes Take 1 tablet (40 mg total) by mouth 2 (two) times daily. Sebastian Toribio GAILS, MD  Active   ramelteon  (ROZEREM ) 8 MG tablet [id number] Yes Take 1 tablet (8 mg total) by mouth at bedtime. Katrinka Garnette KIDD, MD  Active   sertraline  (ZOLOFT ) 50 MG tablet [id number] Yes Take 25 mg by mouth at bedtime. [provider]  Active            Med Note (COFFELL, JON HERO   Tue Jan 26, 2024 10:54 AM)    sevelamer  carbonate (RENVELA ) 800 MG tablet [id number] Yes Take 1,600 mg by mouth 3 (three) times daily with meals. [provider]  Active   Sodium Chloride  Flush (SALINE FLUSH) 0.9 % SOLN [id number] Yes Use 5 mLs by Intracatheter route daily as directed. Caperilla, Marissa N, PA  Active   timolol  (TIMOPTIC ) 0.5 % ophthalmic solution [id number] Yes Place 1 drop into both eyes in the morning and at bedtime.  [provider]  Active            Med Note STEFFI NIAN   Sat Nov 28, 2023 12:48 PM)    traMADol  (ULTRAM ) 50 MG tablet [id number] Yes Take 1 tablet (50 mg total) by mouth every 8 (eight) hours as needed (for pain). Jonel Lonni SQUIBB, MD  Active   TYLENOL  325 MG tablet [id number] Yes Take 325-650 mg by mouth every 6 (six) hours as needed for mild pain (pain score 1-3) or headache. [provider]  Active   Med List Note Donnajean Suzen SQUIBB, CPhT 06/02/22 1426): Dialysis Mon, Wed, Friday            Goals Addressed             This Visit's Progress    VBCI Transitions of Care (TOC) Care Plan       Unable to maintain contact Problems:  Recent Hospitalization for treatment of Small Bowel Obstruction Per daughter, Laray Brooke [facility]), pt lives alone, she comes in daily to check on patient, pt goes to dialysis 3 x week, [facility] home health will be working with pt,  unable to discuss with daughter whether home health has started services, she states she is aware as they worked with pt before, pt is independent with care of ileostomy, has walker and cane, unable to complete review of systems, medication reconciliation, home health services due to daughter is at work and had to end the call, she is off on Monday and requests a call back on 02/08/24. 02/08/24- spoke with daughter who prefers RN CM call pt, aide,  RN CM spoke with pt, aide, no new concerns reported, states pt has all medications, taking as prescribed,  aide is 5 days per week, 12-4 pm, home health has started  Goal:  Over the next 30 days, the patient will not experience hospital readmission  Interventions:  Evaluation of current treatment plan related to small bowel obstruction, self-management and patient's adherence to plan as established by provider. Discussed plans with patient for ongoing care management follow up and provided patient with direct contact information for care management team Evaluation of current treatment plan related to small bowel obstruction and patient's adherence to plan as established by provider Reviewed scheduled/upcoming provider appointments including primary care provider 02/11/24 @ 3 pm, cardiologist 02/18/24 @ 1055 am Discussed plans with patient for ongoing care management follow up and provided patient with direct contact information for care management team Reinforced signs/ symptoms small bowel obstruction, reportable signs/ symptoms and any issues with ileostomy   Patient Self Care Activities:  Attend all scheduled provider appointments Attend church or other social activities Call pharmacy for medication refills 3-7 days in advance of running out of medications Call provider office for new concerns or questions  Notify RN Care Manager of TOC call rescheduling needs Participate in Transition of Care Program/Attend TOC scheduled calls Take medications as  prescribed    Plan:  Telephone follow up appointment with care management team member scheduled for:  [id number] @ 215             Recommendation:   PCP Follow-up  Follow Up Plan:   Telephone follow-up 02/16/24 @ 215 pm  Mliss Creed [facility], BSN RN Care Manager/ Transition of Care [place]/ [facility] Population Health [phone]

## 2024-02-08 NOTE — Patient Instructions (Addendum)
 Visit Information  Thank you for taking time to visit with me today. Please don't hesitate to contact me if I can be of assistance to you before our next scheduled telephone appointment.  Our next appointment is by telephone on 02/16/24 @ 215 pm  Goals Addressed             This Visit's Progress    VBCI Transitions of Care (TOC) Care Plan       Unable to maintain contact Problems:  Recent Hospitalization for treatment of Small Bowel Obstruction Per daughter, Laray Brooke [facility]), pt lives alone, she comes in daily to check on patient, pt goes to dialysis 3 x week, [facility] home health will be working with pt, unable to discuss with daughter whether home health has started services, she states she is aware as they worked with pt before, pt is independent with care of ileostomy, has walker and cane, unable to complete review of systems, medication reconciliation, home health services due to daughter is at work and had to end the call, she is off on Monday and requests a call back on 02/08/24. 02/08/24- spoke with daughter who prefers RN CM call pt, aide,  RN CM spoke with pt, aide, no new concerns reported, states pt has all medications, taking as prescribed,  aide is 5 days per week, 12-4 pm, home health has started  Goal:  Over the next 30 days, the patient will not experience hospital readmission  Interventions:  Evaluation of current treatment plan related to small bowel obstruction, self-management and patient's adherence to plan as established by provider. Discussed plans with patient for ongoing care management follow up and provided patient with direct contact information for care management team Evaluation of current treatment plan related to small bowel obstruction and patient's adherence to plan as established by provider Reviewed scheduled/upcoming provider appointments including primary care provider 02/11/24 @ 3 pm, cardiologist 02/18/24 @ 1055 am Discussed plans with patient for ongoing  care management follow up and provided patient with direct contact information for care management team Reinforced signs/ symptoms small bowel obstruction, reportable signs/ symptoms and any issues with ileostomy   Patient Self Care Activities:  Attend all scheduled provider appointments Attend church or other social activities Call pharmacy for medication refills 3-7 days in advance of running out of medications Call provider office for new concerns or questions  Notify RN Care Manager of TOC call rescheduling needs Participate in Transition of Care Program/Attend TOC scheduled calls Take medications as prescribed    Plan:  Telephone follow up appointment with care management team member scheduled for:  [id number] @ 215             Patient verbalizes understanding of instructions and care plan provided today and agrees to view in MyChart. Active MyChart status and patient understanding of how to access instructions and care plan via MyChart confirmed with patient.     Telephone follow up appointment with care management team member scheduled for:  Please call the care guide team at [phone] if you need to cancel or reschedule your appointment.   Please call the Suicide and Crisis Lifeline: 988 call the [place]  National Suicide Prevention Lifeline: 743 738 2413 or TTY: 765-772-5770 TTY 872-669-1595) to talk to a trained counselor call 1-800-273-TALK (toll free, 24 hour hotline) go to [facility] [address] [place] [phone]) call 911 if you are experiencing a Mental Health or Behavioral Health Crisis or need someone to talk to.  Mliss Creed  RNC, BSN RN Care Manager/ Transition of Care [place]/ [place] [facility] Population [facility] [phone]

## 2024-02-11 ENCOUNTER — Ambulatory Visit: Admitting: Family Medicine

## 2024-02-11 ENCOUNTER — Telehealth: Payer: Self-pay | Admitting: Family Medicine

## 2024-02-11 ENCOUNTER — Encounter: Payer: Self-pay | Admitting: Family Medicine

## 2024-02-11 VITALS — BP 98/72 | HR 78 | Temp 97.7°F | Wt 159.0 lb

## 2024-02-11 DIAGNOSIS — E2740 Unspecified adrenocortical insufficiency: Secondary | ICD-10-CM | POA: Diagnosis not present

## 2024-02-11 DIAGNOSIS — K651 Peritoneal abscess: Secondary | ICD-10-CM

## 2024-02-11 DIAGNOSIS — N186 End stage renal disease: Secondary | ICD-10-CM

## 2024-02-11 DIAGNOSIS — J4540 Moderate persistent asthma, uncomplicated: Secondary | ICD-10-CM

## 2024-02-11 DIAGNOSIS — Z936 Other artificial openings of urinary tract status: Secondary | ICD-10-CM

## 2024-02-11 DIAGNOSIS — Z8719 Personal history of other diseases of the digestive system: Secondary | ICD-10-CM | POA: Diagnosis not present

## 2024-02-11 DIAGNOSIS — Z992 Dependence on renal dialysis: Secondary | ICD-10-CM

## 2024-02-11 DIAGNOSIS — J45909 Unspecified asthma, uncomplicated: Secondary | ICD-10-CM | POA: Insufficient documentation

## 2024-02-11 DIAGNOSIS — I951 Orthostatic hypotension: Secondary | ICD-10-CM | POA: Diagnosis not present

## 2024-02-11 HISTORY — DX: Personal history of other diseases of the digestive system: Z87.19

## 2024-02-11 MED ORDER — HYDROCORTISONE 20 MG PO TABS: 20.0000 mg | ORAL_TABLET | Freq: Every day | ORAL | 5 refills | Status: DC

## 2024-02-11 MED ORDER — HYDROCORTISONE 10 MG PO TABS: 10.0000 mg | ORAL_TABLET | Freq: Every evening | ORAL | 1 refills | Status: DC

## 2024-02-11 MED ORDER — ALBUTEROL SULFATE HFA 108 (90 BASE) MCG/ACT IN AERS: 1.0000 | INHALATION_SPRAY | Freq: Four times a day (QID) | RESPIRATORY_TRACT | 5 refills | Status: DC | PRN

## 2024-02-11 NOTE — Assessment & Plan Note (Signed)
 Reports compliance with Dulera  from pulmonology but continues have shortness of breath-he has not recently tried albuterol  and wants to trial this at this we will send him.  Has been a chronic issue so we did not opt for further workup at this time is overall stable

## 2024-02-11 NOTE — Assessment & Plan Note (Signed)
 Noted and doing well-still produces some urine

## 2024-02-11 NOTE — Assessment & Plan Note (Signed)
 See discussion above-I sent in Cortef  but also referring to palliative care.  Discussed if he is able to call us  as we would likely need to adjust dose

## 2024-02-11 NOTE — Progress Notes (Signed)
 Phone [phone]   Subjective:  Joshua Riley is a 88 y.o. year old very pleasant male patient who presents for transitional care management and hospital follow up for small bowel obstruction. Patient was hospitalized from 01/25/2024 to 02/03/2024. A TCM phone call was completed on 02/05/2024. Medical complexity moderate  Patient presented to the hospital with abdominal pain as well as nausea and vomiting and found to have small bowel obstruction which was treated conservatively with bowel rest.  He was hypotensive but has acute on chronic hypotension as ESRD patient-his midodrine  was increased to 30 mg 3 times a day and continued on Florinef  with known adrenal insufficiency-oral hydrocortisone  (20 mg in the morning and 10 mg in the evening) was added on July 28 and blood pressure subsequently improved.  He was treated with dialysis while inpatient.  Sepsis and septic shock were mentioned on discharge summary without further qualification- patient reports main issue   On discharge he had anemia with hemoglobin of 9.1 which is pretty stable to his baseline.  MCV was substantially elevated at 114 but improving from 121.  Significantly hyponatremic at 123 with baseline prehospitalization level of 136 with only intermittent issues.  Known elevated creatinine with ESRD on dialysis.  Nephrology was aware of hyponatremia with plan for volume removal with dialysis as tolerated  Of note has known l percutaneous nephrostomy tube as well as pigtail catheter in right low back for chronic pelvic abscess drainage  He had a chest x-ray on 01/25/2024 showing no acute cardiopulmonary process other than some scarring in the left lung base.  He does report shortness of breath and shaking today in his hands- this has been long term issue . Reports shortness of breath for months- x-ray reassuring as above in hospital. Once a week therapist. Uses aide and has walker- trying to be mobile  I independently reviewed chest x-ray  from 01/25/2024-airway normal, no bony abnormalities, cardiac silhouette mildly enlarged, diaphragm normal, no acute pulmonary process without effusion, infiltrate, pneumothorax   See problem oriented charting as well  Past Medical History-  Patient Active Problem List   Diagnosis Date Noted   DOE (dyspnea on exertion) 07/22/2022    Priority: High   Ulcerative colitis (HCC) 06/02/2022    Priority: High   End-stage renal disease on hemodialysis (HCC) 05/06/2022    Priority: High   Paroxysmal atrial fibrillation (HCC) 11/18/2021    Priority: High   Orthostatic hypotension 10/22/2021    Priority: High   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    Priority: High   Nephrostomy present (HCC) 10/22/2021    Priority: High   History of anemia due to chronic kidney disease 07/08/2023    Priority: Medium    Depression 04/08/2023    Priority: Medium    History of sepsis 06/02/2022    Priority: Medium    Glaucoma 11/18/2021    Priority: Medium    Idiopathic neuropathy 10/22/2021    Priority: Medium    Abnormal bladder CT 03/23/2023    Priority: Low   Coag negative Staphylococcus bacteremia 06/06/2022    Priority: Low   SBO (small bowel obstruction) (HCC) 11/18/2021    Priority: Low   Septic shock (HCC) 01/26/2024   ESRD on hemodialysis (HCC) 01/26/2024   Small bowel obstruction (HCC) 01/25/2024   Cerebrovascular disease 12/02/2023   Syncope 11/28/2023   Hypotension 11/28/2023   Seizure like activity 11/28/2023   Acute pericarditis 11/11/2023   Sacral osteomyelitis (HCC) 11/11/2023   Chest pain 11/09/2023   Abscess of abdominal  cavity (HCC) 07/17/2023   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   Anastomotic leak of intestine 07/14/2023   History of COPD 07/08/2023   Abscess of groin, left 07/07/2023   Macrocytic anemia 04/08/2023   Infection due to ESBL-producing Escherichia coli 03/23/2023    Medications- reviewed and updated  A medical reconciliation was performed comparing current  medicines to hospital discharge medications. Current Outpatient Medications  Medication Sig Dispense Refill   albuterol  (PROVENTIL ) (2.5 MG/3ML) 0.083% nebulizer solution Take 2.5 mg by nebulization every 6 (six) hours as needed for wheezing or shortness of breath.     atorvastatin  (LIPITOR) 20 MG tablet Take 1 tablet (20 mg total) by mouth daily. 90 tablet 0   calcitRIOL  (ROCALTROL ) 0.5 MCG capsule Take 1 capsule (0.5 mcg total) by mouth every Monday, Wednesday, and Friday with hemodialysis.     Cyanocobalamin (VITAMIN B-12 PO) Take 1 capsule by mouth in the morning and at bedtime.     dorzolamide  (TRUSOPT ) 2 % ophthalmic solution Place 1 drop into the right eye at bedtime.     ELIQUIS  2.5 MG TABS tablet Take 1 tablet (2.5 mg total) by mouth 2 (two) times daily. 60 tablet 11   fludrocortisone  (FLORINEF ) 0.1 MG tablet Take 1 tablet (0.1 mg total) by mouth daily.     folic acid  (FOLVITE ) 1 MG tablet Take 1 tablet (1 mg total) by mouth daily. 30 tablet 0   gabapentin  (NEURONTIN ) 300 MG capsule Take 300 mg by mouth at bedtime.     loperamide  (IMODIUM ) 2 MG capsule Take 1 capsule (2 mg total) by mouth as needed for diarrhea or loose stools. 20 capsule 0   midodrine  (PROAMATINE ) 10 MG tablet Take 3 tablets (30 mg total) by mouth 3 (three) times daily. 270 tablet 1   mometasone -formoterol  (DULERA ) 200-5 MCG/ACT AERO Inhale 2 puffs into the lungs 2 (two) times daily. 1 each 1   multivitamin (RENA-VIT) TABS tablet Take 1 tablet by mouth daily.     Omega Fatty Acids-Vitamins (OMEGA-3 GUMMIES) CHEW Chew 1 tablet by mouth in the morning and at bedtime.     pantoprazole  (PROTONIX ) 40 MG tablet Take 1 tablet (40 mg total) by mouth 2 (two) times daily. 60 tablet 1   ramelteon  (ROZEREM ) 8 MG tablet Take 1 tablet (8 mg total) by mouth at bedtime. 30 tablet 5   sertraline  (ZOLOFT ) 50 MG tablet Take 25 mg by mouth at bedtime.     sevelamer  carbonate (RENVELA ) 800 MG tablet Take 1,600 mg by mouth 3 (three) times  daily with meals.     Sodium Chloride  Flush (SALINE FLUSH) 0.9 % SOLN Use 5 mLs by Intracatheter route daily as directed. 150 mL 1   timolol  (TIMOPTIC ) 0.5 % ophthalmic solution Place 1 drop into both eyes in the morning and at bedtime.     traMADol  (ULTRAM ) 50 MG tablet Take 1 tablet (50 mg total) by mouth every 8 (eight) hours as needed (for pain). 12 tablet 0   TYLENOL  325 MG tablet Take 325-650 mg by mouth every 6 (six) hours as needed for mild pain (pain score 1-3) or headache.     albuterol  (VENTOLIN  HFA) 108 (90 Base) MCG/ACT inhaler Inhale 1-2 puffs into the lungs every 6 (six) hours as needed for wheezing or shortness of breath. 18 g 5   budesonide  (PULMICORT ) 0.5 MG/2ML nebulizer solution Take 0.5 mg by nebulization 2 (two) times daily as needed (for respiratory flares). (Patient not taking: Reported on 02/11/2024)  hydrocortisone  (CORTEF ) 10 MG tablet Take 2 tablets (20 mg total) by mouth daily and take 1 tablet (10 mg total) by mouth every evening. 90 tablet 1   hydrocortisone  (CORTEF ) 20 MG tablet Take 1 tablet (20 mg total) by mouth daily. 30 tablet 5   No current facility-administered medications for this visit.   Objective  Objective:  BP 98/72 (BP Location: Right Arm, Patient Position: Sitting, Cuff Size: Normal)   Pulse 78   Temp 97.7 F (36.5 C) (Temporal)   Wt 159 lb (72.1 kg)   SpO2 97%   BMI 24.18 kg/m  Gen: NAD, resting comfortably CV: RRR no murmurs rubs or gallops Lungs: CTAB no crackles, wheeze, rhonchi Abdomen: soft/nontender/nondistended/normal bowel sounds. No rebound or guarding.  Nephrostomy tube in place.  Pigtail in place in the back related to chronic abscess Ext: Trace edema Skin: warm, dry   Assessment and Plan:   # Hospital follow-up/TCM Assessment & Plan History of small bowel obstruction Thankfully small bowel obstruction resolved with conservative measures.  He has no issues with bowel movements, nausea or vomiting at this point-continue to  monitor Orthostatic hypotension Orthostatic hypotension as well as hypotension General-midodrine  was increased to 30 mg 3 times a day from 50 mg 3 times a day, Florinef  was continued, Cortef  was added 20 mg in the morning and 10 mg in the evening.  We discussed possible endocrinology consult and we also discussed possible palliative care consult-we discussed Cortef  was likely added given all the strain that is been placed on his body with illness over the last few years-he liked the idea of seeing palliative and focusing on quality of life over seeing more specialist like endocrinology at this time Adrenal insufficiency [facility]) See discussion above-I sent in Cortef  but also referring to palliative care.  Discussed if he is able to call us  as we would likely need to adjust dose End-stage renal disease on hemodialysis [facility]) He is on Monday Wednesday Friday hemodialysis. Nephrostomy present [facility]) Noted and doing well-still produces some urine Abscess of abdominal cavity (HCC) Chronic pigtail catheter-reports continues to drain lower levels-continue current medication Moderate persistent asthma without complication Reports compliance with Dulera  from pulmonology but continues have shortness of breath-he has not recently tried albuterol  and wants to trial this at this we will send him.  Has been a chronic issue so we did not opt for further workup at this time is overall stable  Recommended follow up: Return in about 2 months (around 04/12/2024) for followup or sooner if needed.Schedule b4 you leave. Future Appointments  Date Time Provider Department Center  02/16/2024  2:15 PM Aura Mliss LABOR, RN [facility] None  02/18/2024 10:55 AM Percy, Rosaline HERO, NP [facility]  02/24/2024  1:40 PM [facility] ANNUAL WELLNESS VISIT 1 [facility]  03/01/2024  8:30 AM Geronimo Amel, MD [facility] None  04/07/2024  3:00 PM Katrinka Garnette KIDD, MD [facility]    Lab/Order associations:   ICD-10-CM   1. History of  small bowel obstruction  Z87.19     2. Orthostatic hypotension  I95.1 Amb Referral to Palliative Care    3. Adrenal insufficiency (HCC)  E27.40     4. End-stage renal disease on hemodialysis (HCC)  N18.6 Amb Referral to Palliative Care   Z99.2     5. Nephrostomy present (HCC)  Z93.6 Amb Referral to Palliative Care    6. Abscess of abdominal cavity (HCC)  K65.1 Amb Referral to Palliative Care      Meds ordered this  encounter  Medications   hydrocortisone  (CORTEF ) 10 MG tablet    Sig: Take 2 tablets (20 mg total) by mouth daily and take 1 tablet (10 mg total) by mouth every evening.    Dispense:  90 tablet    Refill:  1    combined from Rx [id number]   hydrocortisone  (CORTEF ) 20 MG tablet    Sig: Take 1 tablet (20 mg total) by mouth daily.    Dispense:  30 tablet    Refill:  5   albuterol  (VENTOLIN  HFA) 108 (90 Base) MCG/ACT inhaler    Sig: Inhale 1-2 puffs into the lungs every 6 (six) hours as needed for wheezing or shortness of breath.    Dispense:  18 g    Refill:  5    Return precautions advised.  Garnette Lukes, MD

## 2024-02-11 NOTE — Assessment & Plan Note (Signed)
 He is on Monday Wednesday Friday hemodialysis.

## 2024-02-11 NOTE — Assessment & Plan Note (Signed)
 Orthostatic hypotension as well as hypotension General-midodrine  was increased to 30 mg 3 times a day from 50 mg 3 times a day, Florinef  was continued, Cortef  was added 20 mg in the morning and 10 mg in the evening.  We discussed possible endocrinology consult and we also discussed possible palliative care consult-we discussed Cortef  was likely added given all the strain that is been placed on his body with illness over the last few years-he liked the idea of seeing palliative and focusing on quality of life over seeing more specialist like endocrinology at this time

## 2024-02-11 NOTE — Assessment & Plan Note (Signed)
 Thankfully small bowel obstruction resolved with conservative measures.  He has no issues with bowel movements, nausea or vomiting at this point-continue to monitor

## 2024-02-11 NOTE — Telephone Encounter (Signed)
 Noted. Patient is aware and awaiting call.    Copied from CRM #[id number]. Topic: General - Other >> Feb 11, 2024  4:19 PM Joshua Riley wrote: Reason for CRM: Eloisa with Palliative Care [phone] called about a referral that was approved and they will call the patient to schedule a appointment either today or tomorrow

## 2024-02-11 NOTE — Assessment & Plan Note (Signed)
 Chronic pigtail catheter-reports continues to drain lower levels-continue current medication

## 2024-02-11 NOTE — Patient Instructions (Addendum)
 Try inhaler to see if that helps with breathing- albuterol . Continue the dulera . If worsening see us  back  I sent in the cortef  for you to help support your blood pressure  -take 20 mg in the morning -take 10 mg in the evening -if you get ill while on this call us  as we may need to short term increase dose  I am sending in a palliative care consult- and we opted to hold off on endocrine consult for now if palliative accepts consult- goal is to focus on quality of life considering all you have had to go through- does not mean we are discontinuing care  Recommended follow up: Return in about 2 months (around 04/12/2024) for followup or sooner if needed.Schedule b4 you leave. - push out your appointment on August 25th at the desk

## 2024-02-16 ENCOUNTER — Encounter: Payer: Self-pay | Admitting: *Deleted

## 2024-02-16 ENCOUNTER — Encounter (HOSPITAL_BASED_OUTPATIENT_CLINIC_OR_DEPARTMENT_OTHER): Payer: Self-pay

## 2024-02-16 ENCOUNTER — Other Ambulatory Visit: Payer: Self-pay | Admitting: General Surgery

## 2024-02-16 ENCOUNTER — Telehealth: Payer: Self-pay | Admitting: *Deleted

## 2024-02-16 DIAGNOSIS — K611 Rectal abscess: Secondary | ICD-10-CM

## 2024-02-17 ENCOUNTER — Other Ambulatory Visit: Payer: Self-pay | Admitting: Family Medicine

## 2024-02-17 ENCOUNTER — Telehealth: Payer: Self-pay | Admitting: *Deleted

## 2024-02-17 MED ORDER — ELIQUIS 2.5 MG PO TABS: 2.5000 mg | ORAL_TABLET | Freq: Two times a day (BID) | ORAL | 11 refills | Status: DC

## 2024-02-17 NOTE — Patient Outreach (Addendum)
 Transition of Care week 3  Visit Note  02/17/2024  Name: Joshua Riley MRN: [id number]          DOB: 07-29-1935  Situation: Patient enrolled in [facility] 30-day program. Visit completed with patient by telephone.   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   lyrica     Ileostomy in place [facility])    Obstructive uropathy    With chronic left nephrostomy   Old retinal detachment, total or subtotal    Orthostatic hypotension 10/22/2021   Sleep apnea    does not need a machine   Stroke (HCC)    Ulcerative colitis (HCC)    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    Assessment: Patient Reported Symptoms: Cognitive Cognitive Status: No symptoms reported, Able to follow simple commands, Alert and oriented to person, place, and time, Normal speech and language skills      Neurological Neurological Review of Symptoms: No symptoms reported    HEENT HEENT Symptoms Reported: No symptoms reported      Cardiovascular Cardiovascular Symptoms Reported: No symptoms reported    Respiratory Respiratory Symptoms Reported: No symptoms reported    Endocrine Endocrine Symptoms Reported: No symptoms reported    Gastrointestinal Gastrointestinal Symptoms Reported: Other Other Gastrointestinal Symptoms: ileostomy- no concerns reported Gastrointestinal Self-Management Outcome: 4 (good)    Genitourinary Genitourinary Symptoms Reported: Other Other Genitourinary Symptoms: dialysis 3 x week Additional Genitourinary Details: reinforced renal diet Genitourinary Self-Management Outcome: 4 (good)  Integumentary Integumentary Symptoms Reported: No  symptoms reported    Musculoskeletal Musculoskelatal Symptoms Reviewed: Difficulty walking Additional Musculoskeletal Details: cane and walker Musculoskeletal Management Strategies: Medical device Musculoskeletal Self-Management Outcome: 3 (uncertain) Musculoskeletal Comment: pt states he was using his walker and  feet got tangled this morning and fell, pt denies any injury and called EMS, states  they said everything looks ok      Psychosocial Psychosocial Symptoms Reported: No symptoms reported         There were no vitals filed for this visit.  Medications Reviewed Today     Reviewed by Aura Mliss LABOR, RN (Registered Nurse) on 02/17/24 at 1012  Med List Status: <None>   Medication Order Taking? Sig Documenting Provider Last Dose Status Informant  albuterol  (PROVENTIL ) (2.5 MG/3ML) 0.083% nebulizer solution [id number]  Take 2.5 mg by nebulization every 6 (six) hours as needed for wheezing or shortness of breath. [provider]  Active   albuterol  (VENTOLIN  HFA) 108 (90 Base) MCG/ACT inhaler [id number]  Inhale 1-2 puffs into the lungs every 6 (six) hours as needed for wheezing or shortness of breath. Katrinka Garnette KIDD, MD  Active   atorvastatin  (LIPITOR) 20 MG tablet [id number]  Take 1 tablet (20 mg total) by mouth daily. Sebastian Toribio GAILS, MD  Active   budesonide  (PULMICORT ) 0.5 MG/2ML nebulizer solution [id number]  Take 0.5 mg by nebulization 2 (two) times daily as needed (for respiratory flares).  Patient not taking: Reported on 02/11/2024   [provider]  Active   calcitRIOL  (ROCALTROL ) 0.5 MCG capsule  [id number]  Take 1 capsule (0.5 mcg total) by mouth every Monday, Wednesday, and Friday with hemodialysis. Jonel Lonni SQUIBB, MD  Active   Cyanocobalamin (VITAMIN B-12 PO) [id number]  Take 1 capsule by mouth in the morning and at bedtime. [provider]  Active   dorzolamide  (TRUSOPT ) 2 % ophthalmic solution [id number]  Place 1 drop into the right eye  at bedtime. [provider]  Active            Med Note STEFFI NIAN   Sat Nov 28, 2023 12:49 PM)    ELIQUIS  2.5 MG TABS tablet [id number]  Take 1 tablet (2.5 mg total) by mouth 2 (two) times daily. Hobart Powell BRAVO, MD  Active            Med Note ELSWORTH, JACQUELINE L   Thu Sep 03, 2023 11:18 AM)    fludrocortisone  (FLORINEF ) 0.1 MG tablet [id number]  Take 1 tablet (0.1 mg total) by mouth daily. Jonel Lonni SQUIBB, MD  Active   folic acid  (FOLVITE ) 1 MG tablet [id number]  Take 1 tablet (1 mg total) by mouth daily. Regalado, Belkys A, MD  Active   gabapentin  (NEURONTIN ) 300 MG capsule [id number]  Take 300 mg by mouth at bedtime. [provider]  Active   hydrocortisone  (CORTEF ) 10 MG tablet [id number]  Take 2 tablets (20 mg total) by mouth daily and take 1 tablet (10 mg total) by mouth every evening. Katrinka Garnette KIDD, MD  Active   hydrocortisone  (CORTEF ) 20 MG tablet [id number]  Take 1 tablet (20 mg total) by mouth daily. Katrinka Garnette KIDD, MD  Active   loperamide  (IMODIUM ) 2 MG capsule [id number]  Take 1 capsule (2 mg total) by mouth as needed for diarrhea or loose stools. Sebastian Toribio GAILS, MD  Active   midodrine  (PROAMATINE ) 10 MG tablet [id number]  Take 3 tablets (30 mg total) by mouth 3 (three) times daily. Hunsucker, Donnice SAUNDERS, MD  Active   mometasone -formoterol  (DULERA ) 200-5 MCG/ACT AERO [id number]  Inhale 2 puffs into the lungs 2 (two) times daily. Rosario Leatrice FERNS, MD  Active   multivitamin (RENA-VIT) TABS tablet [id number]  Take 1 tablet by mouth daily. [provider]  Active   Omega Fatty Acids-Vitamins (OMEGA-3 GUMMIES) CHEW [id number]  Chew 1 tablet by mouth in the morning and at bedtime. [provider]  Active   pantoprazole  (PROTONIX ) 40 MG tablet [id number]  Take 1 tablet (40 mg total) by mouth 2 (two) times daily. Sebastian Toribio GAILS, MD  Active   ramelteon  (ROZEREM ) 8 MG tablet [id number]  Take 1 tablet (8 mg total) by mouth at  bedtime. Katrinka Garnette KIDD, MD  Active   sertraline  (ZOLOFT ) 50 MG tablet [id number]  Take 25 mg by mouth at bedtime. [provider]  Active            Med Note (COFFELL, JON HERO   Tue Jan 26, 2024 10:54 AM)    sevelamer  carbonate (RENVELA ) 800 MG tablet [id number]  Take 1,600 mg by mouth 3 (three) times daily with meals. [provider]  Active   Sodium Chloride  Flush (SALINE FLUSH) 0.9 % SOLN [id number]  Use 5 mLs by Intracatheter route daily as directed. Caperilla, Marissa N, PA  Active   timolol  (TIMOPTIC ) 0.5 % ophthalmic solution [id number]  Place 1 drop into both eyes in the morning and at bedtime. [provider]  Active            Med Note STEFFI, ALEXANDRIA  Sat Nov 28, 2023 12:48 PM)    traMADol  (ULTRAM ) 50 MG tablet [id number]  Take 1 tablet (50 mg total) by mouth every 8 (eight) hours as needed (for pain). Jonel Lonni SQUIBB, MD  Active   TYLENOL  325 MG tablet [id number]  Take 325-650 mg by mouth every 6 (six) hours as needed for mild pain (pain score 1-3) or headache. [provider]  Active   Med List Note Donnajean Suzen SQUIBB, CPhT 06/02/22 1426): Dialysis Mon, Wed, Friday            Goals Addressed             This Visit's Progress    [facility] Transitions of Care (TOC) Care Plan       Unable to maintain contact Problems:  Recent Hospitalization for treatment of Small Bowel Obstruction Per daughter, Lar[facility]), pt lives alone, she comes in daily to check on patient, pt goes to dialysis 3 x week, [facility] home health will be working with pt, unable to discuss with daughter whether home health has started services, she states she is aware as they worked with pt before, pt is independent with care of ileostomy, has walker and cane, unable to complete review of systems, medication reconciliation, home health services due to daughter is at work and had to end the call, she is off on Monday and requests a call back on 02/08/24. 02/08/24-  spoke with daughter who prefers RN CM call pt, aide,  RN CM spoke with pt, aide, no new concerns reported, states pt has all medications, taking as prescribed,  aide is 5 days per week, 12-4 pm, home health has started 02/17/24- spoke with patient who reports doing well except I fell this morning using walker and got my feet tangled  pt states EMS came and  checked me out, everything is okay  Goal:  Over the next 30 days, the patient will not experience hospital readmission  Interventions:  Evaluation of current treatment plan related to small bowel obstruction, self-management and patient's adherence to plan as established by provider. Discussed plans with patient for ongoing care management follow up and provided patient with direct contact information for care management team Evaluation of current treatment plan related to small bowel obstruction and patient's adherence to plan as established by provider Reviewed scheduled/upcoming provider appointments including primary care provider [facility] 02/24/24, cardiologist 02/18/24 @ 1055 am Discussed plans with patient for ongoing care management follow up and provided patient with direct contact information for care management team Reviewed signs/ symptoms small bowel obstruction, reportable signs/ symptoms and any issues with ileostomy Reviewed safety precautions   Patient Self Care Activities:  Attend all scheduled provider appointments Attend church or other social activities Call pharmacy for medication refills 3-7 days in advance of running out of medications Call provider office for new concerns or questions  Notify RN Care Manager of TOC call rescheduling needs Participate in Transition of Care Program/Attend TOC scheduled calls Take medications as prescribed   Fall prevention strategies: change positions slowly, use a walker or cane (per provider recommendations) when walking, keep walkways clear, have good lighting in room. It is important  to contact your provider if you have any falls. Maintain muscle strength/tone by exercise per provider recommendations.   Plan:  Telephone follow up appointment with care management team member scheduled for:  02/24/24 @ 315 pm          Recommendation:   PCP Follow-up  Follow Up Plan:  Telephone follow-up 02/24/24 @ 315 pm  Mliss Creed [facility], BSN RN Care Manager/ Transition of Care [place]/ [facility] [phone]

## 2024-02-17 NOTE — Progress Notes (Deleted)
 Cardiology Office Note:  .   Date:  02/17/2024  ID:  [name], DOB [birth year], MRN [id number] PCP: Katrinka Garnette KIDD, MD  [facility] Providers Cardiologist:  Lynwood Schilling, MD    Patient Profile: .      PMH Persistent atrial fibrillation on chronic anticoagulation GIB ESRD on HD DVT Ulcerative colitis s/p colostomy 2012 Orthostatic hypotension  Known history of persistent atrial fibrillation for which he has been maintained on Eliquis  for stroke prevention.  Not on nodal agents due to hypotension with HD requiring midodrine .  Per cardiology records he has a history of a stress test 11/2020 which was negative for ischemia.  TTE with normal LVEF with mitral sclerosis.  Seen in cardiology clinic 05/2022 with worsening shortness of breath with minimal exertion.  We scheduled a cath but then he was admitted 11/27-12/08/2021 prior to procedure for fever up to 103 after HD with confusion.  WBC 7.8K, influenza, COVID-19 PCR negative.  Blood culture positive for staph.  Seen by ID who recommended removal of right groin TDC which was removed by vascular surgery after his left groin AV graft was functional.  Repeat blood cultures were negative.  He requested to postpone catheterization until early January.  TTE 05/2022 with EF 60 to 65%, normal strain, normal RV, trivial MR, ascending aorta 42 mm.  He underwent LHC on 07/22/2022 which showed no significant CAD and normal LVEDP.  At follow-up visit he reported walking more and able to walk at 3 miles per hour on treadmill for 15 minutes.  He continued to have drop in BP with HD and remained on midodrine .  He had BRBPR and Eliquis  was decreased to once daily.  He was referred to [facility] for management.  Seen 02/19/2023 by Dr. Dann.  He remained on Eliquis  2.5 once daily with GI evaluation upcoming at [facility]. Bleeding has improved.  Risks and benefits of anticoagulation were discussed and it was decided to continue once daily Eliquis .  If  bleeding worsens, would have to consider stopping Eliquis  with the understanding of increased risk of stroke.  Seen by me on 09/03/23, accompanied by his daughter. Reported exhaustion and fatigue, particularly after dialysis sessions. Daughter stated he describes this as shortness of breath, but she thinks he feels significant fatigue that he describes as SOB.  He was was recently hospitalized for perirectal abscess and subsequent sepsis. He was advised to resume Eliquis  2.5mg  twice daily. Taking midodrine  15mg  three times daily and sometimes an additional 10 mg during dialysis sessions. Feeling well on non-dialysis days with no chest pain, palpitations, dyspnea, orthopnea, PND, presyncope or syncope. Was diagnosed with right leg cellulitis. Daughter expressed concern about his exhaustion and fatigue, suspecting it may be related to BP, particularly following hemodialysis.        History of Present Illness: .   Joshua Riley is a very pleasant 88 y.o. male who is here for follow-up of atrial fibrillation, he is   Discussed the use of AI scribe software for clinical note transcription with the patient, who gave verbal consent to proceed.   ROS: See HPI       Studies Reviewed: .        Risk Assessment/Calculations:    CHA2DS2-VASc Score = 5   This indicates a 7.2% annual risk of stroke. The patient's score is based upon: CHF History: 0 HTN History: 0 Diabetes History: 0 Stroke History: 2 Vascular Disease History: 1 (aortic atherosclerosis noted on CT) Age Score: 2 Gender Score: 0   {  This patient has a significant risk of stroke if diagnosed with atrial fibrillation.  Please consider VKA or DOAC agent for anticoagulation if the bleeding risk is acceptable.   You can also use the SmartPhrase .[email or web address] for documentation.   :[id number]} No BP recorded.  {Refresh Note OR Click here to enter BP  :1}***       Physical Exam:   VS:  There were no vitals taken for this visit.   Wt Readings  from Last 3 Encounters:  02/11/24 159 lb (72.1 kg)  02/02/24 174 lb 2.6 oz (79 kg)  01/21/24 158 lb 3.2 oz (71.8 kg)    GEN: Well nourished, well developed in no acute distress NECK: No JVD; No carotid bruits CARDIAC: RRR, no murmurs, rubs, gallops RESPIRATORY:  Clear to auscultation without rales, wheezing or rhonchi  ABDOMEN: Soft, non-tender, non-distended EXTREMITIES:  No edema; No deformity     ASSESSMENT AND PLAN: .    Perirectal abscess: Arland reports he was found to have pelvic abscess. Per record review, he has perirectal abscess and associated sepsis that required hospitalization 07/2023. Reports he was advised to resume Eliquis  at 2.5 mg BID.  Most recent CBC 08/08/2023 reveals hgb 9.6. This has been stable per his report. No acute concerns today.   Hypotension: Induced by hemodialysis. BP is stable today. Reports he takes additional midodrine  during dialysis.  He is doing well on nondialysis days with no significant symptoms of syncope or presyncope. He reports varying doses of midodrine  prior to and during HD.  Advised I cannot recommend more than 10 mg 3 times daily which I recommend on nondialysis days.  Persistent AF on chronic anticoagulation: HR is well controlled. He is no longer on AV nodal blocking agents due to hypotension. He has resumed Eliquis  2.5 mg twice daily and denies bleeding concerns.  Continue Eliquis  2.5 mg twice daily which is appropriate dose for stroke prevention for CHA2DS2-VASc score of 5.  Leg cellulitis: Advised leg elevation and potential use of lounge doctor.   ESRD: HD on MWF.  He continues to have hemodialysis induced hypotension.  He takes midodrine  15 mg 3 times daily. States he was told to take an additional 10 mg of midodrine  following dialysis for hypotension by nephrologist. Management per nephrology.        Disposition: ***  Signed, Rosaline Bane, NP-C

## 2024-02-17 NOTE — Patient Instructions (Signed)
 Visit Information  Thank you for taking time to visit with me today. Please don't hesitate to contact me if I can be of assistance to you before our next scheduled telephone appointment.  Following are the goals we discussed today:   Goals Addressed             This Visit's Progress    [facility]I Transitions of Care (TOC) Care Plan       Unable to maintain contact Problems:  Recent Hospitalization for treatment of Small Bowel Obstruction Per daughter, Laray Brooke [facility]), pt lives alone, she comes in daily to check on patient, pt goes to dialysis 3 x week, [facility] home health will be working with pt, unable to discuss with daughter whether home health has started services, she states she is aware as they worked with pt before, pt is independent with care of ileostomy, has walker and cane, unable to complete review of systems, medication reconciliation, home health services due to daughter is at work and had to end the call, she is off on Monday and requests a call back on 02/08/24. 02/08/24- spoke with daughter who prefers RN CM call pt, aide,  RN CM spoke with pt, aide, no new concerns reported, states pt has all medications, taking as prescribed,  aide is 5 days per week, 12-4 pm, home health has started 02/17/24- spoke with patient who reports doing well except I fell this morning using walker and got my feet tangled  pt states EMS came and  checked me out, everything is okay  Goal:  Over the next 30 days, the patient will not experience hospital readmission  Interventions:  Evaluation of current treatment plan related to small bowel obstruction, self-management and patient's adherence to plan as established by provider. Discussed plans with patient for ongoing care management follow up and provided patient with direct contact information for care management team Evaluation of current treatment plan related to small bowel obstruction and patient's adherence to plan as established by  provider Reviewed scheduled/upcoming provider appointments including primary care provider AWV 02/24/24, cardiologist 02/18/24 @ 1055 am Discussed plans with patient for ongoing care management follow up and provided patient with direct contact information for care management team Reviewed signs/ symptoms small bowel obstruction, reportable signs/ symptoms and any issues with ileostomy Reviewed safety precautions   Patient Self Care Activities:  Attend all scheduled provider appointments Attend church or other social activities Call pharmacy for medication refills 3-7 days in advance of running out of medications Call provider office for new concerns or questions  Notify RN Care Manager of TOC call rescheduling needs Participate in Transition of Care Program/Attend TOC scheduled calls Take medications as prescribed   Fall prevention strategies: change positions slowly, use a walker or cane (per provider recommendations) when walking, keep walkways clear, have good lighting in room. It is important to contact your provider if you have any falls. Maintain muscle strength/tone by exercise per provider recommendations.   Plan:  Telephone follow up appointment with care management team member scheduled for:  02/24/24 @ 315 pm           Our next appointment is by telephone on 02/24/24 at 315 pm  Please call the care guide team at [phone] if you need to cancel or reschedule your appointment.   If you are experiencing a Mental Health or Behavioral Health Crisis or need someone to talk to, please call the Suicide and Crisis Lifeline: 988 call the [place]  National Suicide Prevention Lifeline: (914)070-2066  or TTY: (917)805-5901 TTY [phone]) to talk to a trained counselor call 1-800-273-TALK (toll free, 24 hour hotline) go to [facility] [address], [place] [phone]) call 911   Patient verbalizes understanding of instructions and care plan  provided today and agrees to view in MyChart. Active MyChart status and patient understanding of how to access instructions and care plan via MyChart confirmed with patient.     Telephone follow up appointment with care management team member scheduled for:  Mliss Creed [facility], BSN RN Care Manager/ Transition of Care [place] [phone]

## 2024-02-17 NOTE — Telephone Encounter (Signed)
 Copied from CRM #[id number]. Topic: Clinical - Medication Refill >> Feb 17, 2024 12:40 PM Joshua Riley wrote: Medication: ELIQUIS  2.5 MG TABS tablet  Has the patient contacted their pharmacy? Yes (Agent: If no, request that the patient contact the pharmacy for the refill. If patient does not wish to contact the pharmacy document the reason why and proceed with request.) (Agent: If yes, when and what did the pharmacy advise?)  This is the patient's preferred pharmacy:  [id number] [place], [place] - [address] [place] [address] Phone: [phone] Fax: [phone]  [name] [address] [place] [address] Phone: [phone] Fax: [phone]  Is this the correct pharmacy for this prescription? Yes If no, delete pharmacy and type the correct one.   Has the prescription been filled recently? Yes  Is the patient out of the medication? Yes  Has the patient been seen for an appointment in the last year OR does the patient have an upcoming appointment? Yes  Can we respond through MyChart? No  Agent: Please be advised that Rx refills may take up to 3 business days. We ask that you follow-up with your pharmacy.

## 2024-02-18 ENCOUNTER — Ambulatory Visit (HOSPITAL_BASED_OUTPATIENT_CLINIC_OR_DEPARTMENT_OTHER): Admitting: Nurse Practitioner

## 2024-02-18 ENCOUNTER — Inpatient Hospital Stay: Admitting: Family Medicine

## 2024-02-19 ENCOUNTER — Other Ambulatory Visit: Payer: Self-pay | Admitting: General Surgery

## 2024-02-19 ENCOUNTER — Ambulatory Visit
Admission: RE | Admit: 2024-02-19 | Discharge: 2024-02-19 | Disposition: A | Discharge status: Home / Self Care | Source: Ambulatory Visit | Attending: General Surgery | Admitting: General Surgery

## 2024-02-19 ENCOUNTER — Encounter: Payer: Self-pay | Admitting: Radiology

## 2024-02-19 DIAGNOSIS — K611 Rectal abscess: Secondary | ICD-10-CM

## 2024-02-19 DIAGNOSIS — K651 Peritoneal abscess: Secondary | ICD-10-CM

## 2024-02-19 MED ORDER — IOPAMIDOL (ISOVUE-300) INJECTION 61%
100.0000 mL | Freq: Once | INTRAVENOUS | Status: AC | PRN
  Administered 2024-02-19: 100 mL via INTRAVENOUS

## 2024-02-19 MED ORDER — IOPAMIDOL (ISOVUE-300) INJECTION 61%
30.0000 mL | Freq: Once | INTRAVENOUS | Status: AC | PRN
  Administered 2024-02-19: 20 mL

## 2024-02-19 MED ORDER — LIDOCAINE-EPINEPHRINE 1 %-1:100000 IJ SOLN
10.0000 mL | Freq: Once | INTRAMUSCULAR | Status: AC
  Administered 2024-02-19: 10 mL via INTRADERMAL

## 2024-02-19 NOTE — Progress Notes (Addendum)
 Referring Physician(s): Ramirez,Armando  Chief Complaint: The patient is seen in follow up today s/p presacral abscess drain placed 01/07/24  History of present illness:  Joshua Riley is an 88 y.o. male with a history of ESRD on hemodialysis, obstructive uropathy s/p left PCN placement, ulcerative colitis s/p colectomy with ileostomy, and chronic presacral fluid collection who is known to IR from previous PCN placement and transgluteal drain placements. Patient had undergone drain placements 07/2023 and 11/2023 with both drains becoming unintentionally displaced. At a clinic visit in 12/18/23 the drain continued to have substantial output and injection revealed fistulization. The drain was downsized from a 10 fr to an 8 Fr. He was scheduled to return for follow up a few weeks later, but his drain became dislodged during that period.   A CT scan 12/30/23 which showed a residual or reaccumulated fluid collection in the posterior pelvis/presacral region. He was referred back to IR and on 01/07/24 he received an 8 fr drain into this fluid collection.   He presents to the outpatient IR clinic for a drain evaluation. He was hospitalized 7/21-7/30 for a small bowel obstruction. He was treated conservatively. The patient reports minimal output from the drain. He feels in his usual state of health. He complains of back pain.   Past Medical History:  Diagnosis Date   A-fib (HCC)    Anemia    Arthritis    Cancer (HCC)    Basal cell   COVID-19    2021   Dysrhythmia    Afib-controlled on eliquis    ESRD (end stage renal disease) (HCC) 10/22/2021   Glaucoma 11/18/2021   History of DVT (deep vein thrombosis)    Hydronephrosis    managed wtih a PCN   Idiopathic neuropathy 10/22/2021   lyrica     Ileostomy in place [facility])    Obstructive uropathy    With chronic left nephrostomy   Old retinal detachment, total or subtotal    Orthostatic hypotension 10/22/2021   Sleep apnea    does not need a machine    Stroke (HCC)    Ulcerative colitis (HCC)    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    Past Surgical History:  Procedure Laterality Date   BASAL CELL CARCINOMA EXCISION     10/23   COLON SURGERY     creation of j pouch     and subsequent takedown of j pouch   EYE SURGERY     IR CATHETER TUBE CHANGE  12/18/2023   IR NEPHROSTOMY EXCHANGE LEFT  12/10/2021   IR NEPHROSTOMY EXCHANGE LEFT  04/22/2022   IR NEPHROSTOMY EXCHANGE LEFT  07/29/2022   IR NEPHROSTOMY EXCHANGE LEFT  10/28/2022   IR NEPHROSTOMY EXCHANGE LEFT  02/05/2023   IR NEPHROSTOMY EXCHANGE LEFT  05/07/2023   IR NEPHROSTOMY EXCHANGE LEFT  06/25/2023   IR NEPHROSTOMY EXCHANGE LEFT  07/17/2023   IR NEPHROSTOMY EXCHANGE LEFT  10/22/2023   IR RADIOLOGIST EVAL & MGMT  11/25/2023   IR RADIOLOGIST EVAL & MGMT  12/18/2023   LEFT HEART CATH AND CORONARY ANGIOGRAPHY N/A 07/22/2022   Procedure: LEFT HEART CATH AND CORONARY ANGIOGRAPHY;  Surgeon: Swaziland, Peter M, MD;  Location: [facility] INVASIVE CV LAB;  Service: Cardiovascular;  Laterality: N/A;   REVISION OF ARTERIOVENOUS GORETEX GRAFT Left 05/06/2022   Procedure: REDO LEFT THIGH ARTERIOVENOUS 4-7 MM GORETEX GRAFT;  Surgeon: Eliza Lonni RAMAN, MD;  Location: [facility] OR;  Service: Vascular;  Laterality: Left;   SMALL INTESTINE SURGERY  TOTAL COLECTOMY      Allergies: Baclofen, Cephalosporins, and Ciprofloxacin  Medications: Prior to Admission medications   Medication Sig Start Date End Date Taking? Authorizing Provider  albuterol  (PROVENTIL ) (2.5 MG/3ML) 0.083% nebulizer solution Take 2.5 mg by nebulization every 6 (six) hours as needed for wheezing or shortness of breath.    [provider]  albuterol  (VENTOLIN  HFA) 108 (90 Base) MCG/ACT inhaler Inhale 1-2 puffs into the lungs every 6 (six) hours as needed for wheezing or shortness of breath. 02/11/24   Katrinka Garnette KIDD, MD  atorvastatin  (LIPITOR) 20 MG tablet Take 1 tablet (20 mg total) by mouth daily. 11/14/23    Sebastian Toribio GAILS, MD  budesonide  (PULMICORT ) 0.5 MG/2ML nebulizer solution Take 0.5 mg by nebulization 2 (two) times daily as needed (for respiratory flares). Patient not taking: Reported on 02/11/2024    [provider]  calcitRIOL  (ROCALTROL ) 0.5 MCG capsule Take 1 capsule (0.5 mcg total) by mouth every Monday, Wednesday, and Friday with hemodialysis. 12/04/23   Danford, Lonni SQUIBB, MD  Cyanocobalamin (VITAMIN B-12 PO) Take 1 capsule by mouth in the morning and at bedtime.    [provider]  dorzolamide  (TRUSOPT ) 2 % ophthalmic solution Place 1 drop into the right eye at bedtime. 06/17/23   [provider]  ELIQUIS  2.5 MG TABS tablet Take 1 tablet (2.5 mg total) by mouth 2 (two) times daily. 02/17/24   Katrinka Garnette KIDD, MD  fludrocortisone  (FLORINEF ) 0.1 MG tablet Take 1 tablet (0.1 mg total) by mouth daily. 12/04/23   Danford, Lonni SQUIBB, MD  folic acid  (FOLVITE ) 1 MG tablet Take 1 tablet (1 mg total) by mouth daily. 07/21/23   Regalado, Belkys A, MD  gabapentin  (NEURONTIN ) 300 MG capsule Take 300 mg by mouth at bedtime. 09/09/23   [provider]  hydrocortisone  (CORTEF ) 10 MG tablet Take 2 tablets (20 mg total) by mouth daily and take 1 tablet (10 mg total) by mouth every evening. 02/11/24   Katrinka Garnette KIDD, MD  hydrocortisone  (CORTEF ) 20 MG tablet Take 1 tablet (20 mg total) by mouth daily. 02/11/24   Katrinka Garnette KIDD, MD  loperamide  (IMODIUM ) 2 MG capsule Take 1 capsule (2 mg total) by mouth as needed for diarrhea or loose stools. 11/13/23   Sebastian Toribio GAILS, MD  midodrine  (PROAMATINE ) 10 MG tablet Take 3 tablets (30 mg total) by mouth 3 (three) times daily. 02/03/24   Hunsucker, Donnice SAUNDERS, MD  mometasone -formoterol  (DULERA ) 200-5 MCG/ACT AERO Inhale 2 puffs into the lungs 2 (two) times daily. 09/08/23   Rosario Eland I, MD  multivitamin (RENA-VIT) TABS tablet Take 1 tablet by mouth daily.    [provider]  Omega Fatty Acids-Vitamins (OMEGA-3  GUMMIES) CHEW Chew 1 tablet by mouth in the morning and at bedtime.    [provider]  pantoprazole  (PROTONIX ) 40 MG tablet Take 1 tablet (40 mg total) by mouth 2 (two) times daily. 11/13/23   Sebastian Toribio GAILS, MD  ramelteon  (ROZEREM ) 8 MG tablet Take 1 tablet (8 mg total) by mouth at bedtime. 10/27/23   Katrinka Garnette KIDD, MD  sertraline  (ZOLOFT ) 50 MG tablet Take 25 mg by mouth at bedtime.    [provider]  sevelamer  carbonate (RENVELA ) 800 MG tablet Take 1,600 mg by mouth 3 (three) times daily with meals.    [provider]  Sodium Chloride  Flush (SALINE FLUSH) 0.9 % SOLN Use 5 mLs by Intracatheter route daily as directed. 11/25/23   Caperilla, Daved SAILOR, PA  timolol  (TIMOPTIC ) 0.5 % ophthalmic solution Place 1 drop into both eyes in the morning and at bedtime.    [provider]  traMADol  (ULTRAM ) 50 MG tablet Take 1 tablet (50 mg total) by mouth every 8 (eight) hours as needed (for pain). 12/03/23   Danford, Lonni SQUIBB, MD  TYLENOL  325 MG tablet Take 325-650 mg by mouth every 6 (six) hours as needed for mild pain (pain score 1-3) or headache.    [provider]     Family History  Problem Relation Age of Onset   Stroke Mother    Cancer Father    Esophageal cancer Brother     Social History   Socioeconomic History   Marital status: Widowed    Spouse name: Not on file   Number of children: Not on file   Years of education: Not on file   Highest education level: 12th grade  Occupational History   Not on file  Tobacco Use   Smoking status: Former    Current packs/day: 0.00    Average packs/day: 2.0 packs/day for 6.0 years (12.0 ttl pk-yrs)    Types: Cigarettes    Start date: 35    Quit date: 80    Years since quitting: 40.6    Passive exposure: Never   Smokeless tobacco: Never  Vaping Use   Vaping status: Never Used  Substance and Sexual Activity   Alcohol use: Yes    Alcohol/week: 5.0 standard drinks of alcohol    Types:  5 Shots of liquor per week    Comment: socially   Drug use: Never   Sexual activity: Not Currently  Other Topics Concern   Not on file  Social History Narrative   Widowed- lost wife of 13 years to pancreatic cancer- previously married 43 years. Son in [place] and daughter helping in [place]. 4 grandkids.       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       Hobbies: dinner with family- occasional martini   Social Drivers of Health   Financial Resource Strain: Low Risk  (10/26/2023)   Overall Financial Resource Strain (CARDIA)    Difficulty of Paying Living Expenses: Not hard at all  Food Insecurity: No Food Insecurity (02/05/2024)   Hunger Vital Sign    Worried About Running Out of Food in the Last Year: Never true    Ran Out of Food in the Last Year: Never true  Transportation Needs: No Transportation Needs (02/05/2024)   PRAPARE - Administrator, Civil Service (Medical): No    Lack of Transportation (Non-Medical): No  Physical Activity: Insufficiently Active (10/26/2023)   Exercise Vital Sign    Days of Exercise per Week: 1 day    Minutes of Exercise per Session: 10 min  Stress: Stress Concern Present (10/26/2023)   [employer] of Occupational Health - Occupational Stress Questionnaire    Feeling of Stress : To some extent  Social Connections: Socially Isolated (01/27/2024)   Social Connection and Isolation Panel    Frequency of Communication with Friends and Family: More than three times a week    Frequency of Social Gatherings with Friends and Family: More than three times a week    Attends Religious Services: Never    Database administrator or Organizations: No    Attends Banker Meetings: Never    Marital Status: Widowed     Vital Signs: There were no vitals taken for this  visit.  Physical Exam Constitutional:      General: He is not in acute distress. Pulmonary:     Effort: Pulmonary effort is normal.  Abdominal:      Tenderness: There is no abdominal tenderness.     Comments: Presacral abscess drain to bulb suction. Scant amount of purulent material in bulb. Suture intact. Site is clean/dry.   Left flank PCN to gravity bag. Approximately 30 ml of amber/yellow fluid. Site is unremarkable. No suture in place.   Skin:    General: Skin is warm and dry.  Neurological:     Mental Status: He is alert and oriented to person, place, and time.     Imaging: No results found.  Labs:  CBC: Recent Labs    01/30/24 0236 01/31/24 0320 02/01/24 0300 02/02/24 0700  WBC 8.3 8.2 9.3 8.6  HGB 8.9* 8.8* 9.3* 9.1*  HCT 28.3* 28.0* 29.7* 28.3*  PLT 189 212 179 171    COAGS: Recent Labs    11/11/23 0005 11/11/23 0706 11/27/23 1412 01/07/24 0841 01/25/24 1854 01/27/24 0024 01/28/24 0349 01/29/24 0059 01/29/24 1515 01/30/24 0236  INR 1.3*  --  1.1 1.3* 1.3*  --   --   --   --   --   APTT  --    < >  --   --   --    < > 75* 68* 56* 90*   < > = values in this interval not displayed.    BMP: Recent Labs    01/30/24 0236 01/31/24 0320 02/01/24 0300 02/02/24 0540 02/02/24 0700  NA 129* 128* 127*  --  123*  K 4.0 4.4 4.2  --  4.4  CL 89* 92* 90*  --  88*  CO2 24 21* 20*  --  18*  GLUCOSE 128* 159* 110*  --  132*  BUN 50* 61* 70*  --  86*  CALCIUM  8.4* 8.0* 8.1*  --  8.1*  CREATININE 6.16* 6.82* 8.59* 9.83* 9.72*  GFRNONAA 8* 7* 6* 5* 5*    LIVER FUNCTION TESTS: Recent Labs    11/27/23 1412 11/29/23 0230 11/30/23 0500 12/02/23 0428 01/25/24 1854 01/27/24 1013 02/02/24 0700  BILITOT 0.8 1.1  --   --  0.7 1.0  --   AST 25 23  --   --  28 19  --   ALT 15 16  --   --  27 16  --   ALKPHOS 69 56  --   --  144* 83  --   PROT 6.6 5.8*  --   --  7.9 6.4*  --   ALBUMIN  3.3* 3.1*   < > 3.0* 4.4 3.1* 3.6   < > = values in this interval not displayed.    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   CT imaging today shows a residual presacral  fluid collection with the pigtail catheter located just outside of the collection. This finding was discussed with the patient and we recommended a drain exchange. The patient was agreeable to this. We also recommended a left PCN exchange since this drain had not been exchanged since October 22, 2023. The patient was also agreeable to this.   The left PCN was exchanged without difficulty but the presacral abscess drain exchange was unsuccessful. Please see the full dictations under the imaging tab in Epic.  Plan:  - Schedule for outpatient placement of a new pre-sacral abscess drain with CT guidance at  [facility]. Schedule for 02/23/24.   - Plan for moderate sedation   Signed: Warren JONELLE Dais, NP 02/19/2024, 8:31 AM   Please refer to Dr. Terrill attestation of this note for management and plan.

## 2024-02-22 ENCOUNTER — Inpatient Hospital Stay (HOSPITAL_COMMUNITY)
Admission: EM | Admit: 2024-02-22 | Discharge: 2024-02-24 | DRG: 270 | Disposition: A | Discharge status: Home Health | Attending: Internal Medicine | Admitting: Internal Medicine

## 2024-02-22 ENCOUNTER — Other Ambulatory Visit: Payer: Self-pay

## 2024-02-22 ENCOUNTER — Encounter (HOSPITAL_COMMUNITY): Payer: Self-pay

## 2024-02-22 ENCOUNTER — Emergency Department (HOSPITAL_COMMUNITY)

## 2024-02-22 DIAGNOSIS — Z7189 Other specified counseling: Secondary | ICD-10-CM | POA: Diagnosis not present

## 2024-02-22 DIAGNOSIS — Z79899 Other long term (current) drug therapy: Secondary | ICD-10-CM

## 2024-02-22 DIAGNOSIS — Z91048 Other nonmedicinal substance allergy status: Secondary | ICD-10-CM

## 2024-02-22 DIAGNOSIS — L0291 Cutaneous abscess, unspecified: Secondary | ICD-10-CM | POA: Diagnosis not present

## 2024-02-22 DIAGNOSIS — K6819 Other retroperitoneal abscess: Secondary | ICD-10-CM | POA: Diagnosis present

## 2024-02-22 DIAGNOSIS — T829XXA Unspecified complication of cardiac and vascular prosthetic device, implant and graft, initial encounter: Secondary | ICD-10-CM | POA: Diagnosis not present

## 2024-02-22 DIAGNOSIS — I482 Chronic atrial fibrillation, unspecified: Secondary | ICD-10-CM | POA: Diagnosis not present

## 2024-02-22 DIAGNOSIS — K5190 Ulcerative colitis, unspecified, without complications: Secondary | ICD-10-CM | POA: Diagnosis present

## 2024-02-22 DIAGNOSIS — Y832 Surgical operation with anastomosis, bypass or graft as the cause of abnormal reaction of the patient, or of later complication, without mention of misadventure at the time of the procedure: Secondary | ICD-10-CM | POA: Diagnosis present

## 2024-02-22 DIAGNOSIS — E8889 Other specified metabolic disorders: Secondary | ICD-10-CM | POA: Diagnosis present

## 2024-02-22 DIAGNOSIS — N186 End stage renal disease: Secondary | ICD-10-CM | POA: Diagnosis not present

## 2024-02-22 DIAGNOSIS — Z992 Dependence on renal dialysis: Secondary | ICD-10-CM

## 2024-02-22 DIAGNOSIS — Z515 Encounter for palliative care: Secondary | ICD-10-CM

## 2024-02-22 DIAGNOSIS — D631 Anemia in chronic kidney disease: Secondary | ICD-10-CM | POA: Diagnosis present

## 2024-02-22 DIAGNOSIS — Z9049 Acquired absence of other specified parts of digestive tract: Secondary | ICD-10-CM

## 2024-02-22 DIAGNOSIS — Z87891 Personal history of nicotine dependence: Secondary | ICD-10-CM

## 2024-02-22 DIAGNOSIS — T82868A Thrombosis of vascular prosthetic devices, implants and grafts, initial encounter: Principal | ICD-10-CM | POA: Diagnosis present

## 2024-02-22 DIAGNOSIS — Z800 Family history of malignant neoplasm of digestive organs: Secondary | ICD-10-CM

## 2024-02-22 DIAGNOSIS — Z8719 Personal history of other diseases of the digestive system: Secondary | ICD-10-CM

## 2024-02-22 DIAGNOSIS — Z66 Do not resuscitate: Secondary | ICD-10-CM | POA: Diagnosis present

## 2024-02-22 DIAGNOSIS — Z86718 Personal history of other venous thrombosis and embolism: Secondary | ICD-10-CM

## 2024-02-22 DIAGNOSIS — Z823 Family history of stroke: Secondary | ICD-10-CM

## 2024-02-22 DIAGNOSIS — Z932 Ileostomy status: Secondary | ICD-10-CM

## 2024-02-22 DIAGNOSIS — E875 Hyperkalemia: Secondary | ICD-10-CM | POA: Diagnosis present

## 2024-02-22 DIAGNOSIS — Z91158 Patient's noncompliance with renal dialysis for other reason: Secondary | ICD-10-CM

## 2024-02-22 DIAGNOSIS — Z85828 Personal history of other malignant neoplasm of skin: Secondary | ICD-10-CM

## 2024-02-22 DIAGNOSIS — I480 Paroxysmal atrial fibrillation: Secondary | ICD-10-CM | POA: Diagnosis present

## 2024-02-22 DIAGNOSIS — E2740 Unspecified adrenocortical insufficiency: Secondary | ICD-10-CM | POA: Diagnosis present

## 2024-02-22 DIAGNOSIS — H9190 Unspecified hearing loss, unspecified ear: Secondary | ICD-10-CM | POA: Diagnosis present

## 2024-02-22 DIAGNOSIS — K56609 Unspecified intestinal obstruction, unspecified as to partial versus complete obstruction: Secondary | ICD-10-CM | POA: Diagnosis present

## 2024-02-22 DIAGNOSIS — T8249XA Other complication of vascular dialysis catheter, initial encounter: Secondary | ICD-10-CM

## 2024-02-22 DIAGNOSIS — Z8673 Personal history of transient ischemic attack (TIA), and cerebral infarction without residual deficits: Secondary | ICD-10-CM

## 2024-02-22 DIAGNOSIS — Z881 Allergy status to other antibiotic agents status: Secondary | ICD-10-CM

## 2024-02-22 DIAGNOSIS — N139 Obstructive and reflux uropathy, unspecified: Secondary | ICD-10-CM | POA: Diagnosis present

## 2024-02-22 DIAGNOSIS — Z7901 Long term (current) use of anticoagulants: Secondary | ICD-10-CM

## 2024-02-22 DIAGNOSIS — J45909 Unspecified asthma, uncomplicated: Secondary | ICD-10-CM | POA: Diagnosis present

## 2024-02-22 DIAGNOSIS — W19XXXA Unspecified fall, initial encounter: Secondary | ICD-10-CM | POA: Diagnosis present

## 2024-02-22 DIAGNOSIS — I951 Orthostatic hypotension: Secondary | ICD-10-CM | POA: Diagnosis present

## 2024-02-22 DIAGNOSIS — Z936 Other artificial openings of urinary tract status: Secondary | ICD-10-CM

## 2024-02-22 LAB — I-STAT CHEM 8, ED
BUN: 61 mg/dL — ABNORMAL HIGH (ref 8–23)
Calcium, Ion: 1 mmol/L — ABNORMAL LOW (ref 1.15–1.40)
Chloride: 105 mmol/L (ref 98–111)
Creatinine, Ser: 11 mg/dL — ABNORMAL HIGH (ref 0.61–1.24)
Glucose, Bld: 149 mg/dL — ABNORMAL HIGH (ref 70–99)
HCT: 34 % — ABNORMAL LOW (ref 39.0–52.0)
Hemoglobin: 11.6 g/dL — ABNORMAL LOW (ref 13.0–17.0)
Potassium: 4.5 mmol/L (ref 3.5–5.1)
Sodium: 133 mmol/L — ABNORMAL LOW (ref 135–145)
TCO2: 18 mmol/L — ABNORMAL LOW (ref 22–32)

## 2024-02-22 LAB — CBC WITH DIFFERENTIAL/PLATELET
Basophils Absolute: 0 K/uL (ref 0.0–0.1)
Basophils Relative: 0 %
Eosinophils Absolute: 0.2 K/uL (ref 0.0–0.5)
Eosinophils Relative: 3 %
HCT: 34.8 % — ABNORMAL LOW (ref 39.0–52.0)
Hemoglobin: 10.1 g/dL — ABNORMAL LOW (ref 13.0–17.0)
Lymphocytes Relative: 5 %
Lymphs Abs: 0.3 K/uL — ABNORMAL LOW (ref 0.7–4.0)
MCH: 37 pg — ABNORMAL HIGH (ref 26.0–34.0)
MCHC: 29 g/dL — ABNORMAL LOW (ref 30.0–36.0)
MCV: 127.5 fL — ABNORMAL HIGH (ref 80.0–100.0)
Monocytes Absolute: 0.4 K/uL (ref 0.1–1.0)
Monocytes Relative: 6 %
Neutro Abs: 5.3 K/uL (ref 1.7–7.7)
Neutrophils Relative %: 86 %
Platelets: 116 K/uL — ABNORMAL LOW (ref 150–400)
RBC: 2.73 MIL/uL — ABNORMAL LOW (ref 4.22–5.81)
RDW: 19.5 % — ABNORMAL HIGH (ref 11.5–15.5)
WBC: 6.2 K/uL (ref 4.0–10.5)
nRBC: 0 % (ref 0.0–0.2)

## 2024-02-22 LAB — COMPREHENSIVE METABOLIC PANEL WITH GFR
ALT: 24 U/L (ref 0–44)
AST: 21 U/L (ref 15–41)
Albumin: 3.3 g/dL — ABNORMAL LOW (ref 3.5–5.0)
Alkaline Phosphatase: 85 U/L (ref 38–126)
Anion gap: 18 — ABNORMAL HIGH (ref 5–15)
BUN: 68 mg/dL — ABNORMAL HIGH (ref 8–23)
CO2: 17 mmol/L — ABNORMAL LOW (ref 22–32)
Calcium: 8.7 mg/dL — ABNORMAL LOW (ref 8.9–10.3)
Chloride: 102 mmol/L (ref 98–111)
Creatinine, Ser: 10.32 mg/dL — ABNORMAL HIGH (ref 0.61–1.24)
GFR, Estimated: 4 mL/min — ABNORMAL LOW (ref >60)
Glucose, Bld: 153 mg/dL — ABNORMAL HIGH (ref 70–99)
Potassium: 4.8 mmol/L (ref 3.5–5.1)
Sodium: 137 mmol/L (ref 135–145)
Total Bilirubin: 0.8 mg/dL (ref 0.0–1.2)
Total Protein: 6.5 g/dL (ref 6.5–8.1)

## 2024-02-22 LAB — APTT: aPTT: 39 s — ABNORMAL HIGH (ref 24–36)

## 2024-02-22 LAB — HEPARIN LEVEL (UNFRACTIONATED): Heparin Unfractionated: 0.49 [IU]/mL (ref 0.30–0.70)

## 2024-02-22 MED ORDER — MIDODRINE HCL 5 MG PO TABS
30.0000 mg | ORAL_TABLET | Freq: Three times a day (TID) | ORAL | Status: DC
  Administered 2024-02-22 – 2024-02-24 (×7): 30 mg via ORAL
  Filled 2024-02-22 (×7): qty 6

## 2024-02-22 MED ORDER — ONDANSETRON HCL 4 MG PO TABS: 4.0000 mg | ORAL_TABLET | Freq: Four times a day (QID) | ORAL | Status: DC | PRN

## 2024-02-22 MED ORDER — HYDROCORTISONE 5 MG PO TABS: 15.0000 mg | ORAL_TABLET | Freq: Two times a day (BID) | ORAL | Status: DC

## 2024-02-22 MED ORDER — ALBUTEROL SULFATE (2.5 MG/3ML) 0.083% IN NEBU: 2.5000 mg | INHALATION_SOLUTION | Freq: Four times a day (QID) | RESPIRATORY_TRACT | Status: DC | PRN

## 2024-02-22 MED ORDER — SERTRALINE HCL 50 MG PO TABS
25.0000 mg | ORAL_TABLET | Freq: Every day | ORAL | Status: DC
  Administered 2024-02-22 – 2024-02-23 (×2): 25 mg via ORAL
  Filled 2024-02-22 (×2): qty 1

## 2024-02-22 MED ORDER — ACETAMINOPHEN 325 MG PO TABS: 650.0000 mg | ORAL_TABLET | Freq: Four times a day (QID) | ORAL | Status: DC | PRN

## 2024-02-22 MED ORDER — HYDROCORTISONE SOD SUC (PF) 100 MG IJ SOLR
100.0000 mg | Freq: Two times a day (BID) | INTRAMUSCULAR | Status: DC
  Administered 2024-02-22 – 2024-02-24 (×5): 100 mg via INTRAVENOUS
  Filled 2024-02-22 (×7): qty 2

## 2024-02-22 MED ORDER — RENA-VITE PO TABS
1.0000 | ORAL_TABLET | Freq: Every day | ORAL | Status: DC
  Administered 2024-02-23 – 2024-02-24 (×2): 1 via ORAL
  Filled 2024-02-22 (×2): qty 1

## 2024-02-22 MED ORDER — MELATONIN 3 MG PO TABS
6.0000 mg | ORAL_TABLET | Freq: Every evening | ORAL | Status: DC | PRN
  Administered 2024-02-22 – 2024-02-23 (×2): 6 mg via ORAL
  Filled 2024-02-22 (×2): qty 2

## 2024-02-22 MED ORDER — ACETAMINOPHEN 650 MG RE SUPP: 650.0000 mg | Freq: Four times a day (QID) | RECTAL | Status: DC | PRN

## 2024-02-22 MED ORDER — HEPARIN (PORCINE) 25000 UT/250ML-% IV SOLN
1300.0000 [IU]/h | INTRAVENOUS | Status: DC
  Administered 2024-02-22: 1250 [IU]/h via INTRAVENOUS
  Administered 2024-02-23: 1300 [IU]/h via INTRAVENOUS
  Administered 2024-02-23: 1450 [IU]/h via INTRAVENOUS
  Administered 2024-02-24: 1300 [IU]/h via INTRAVENOUS
  Filled 2024-02-22 (×3): qty 250

## 2024-02-22 MED ORDER — MIDODRINE HCL 5 MG PO TABS: 30.0000 mg | ORAL_TABLET | Freq: Three times a day (TID) | ORAL | Status: DC

## 2024-02-22 MED ORDER — TRAMADOL HCL 50 MG PO TABS
50.0000 mg | ORAL_TABLET | Freq: Three times a day (TID) | ORAL | Status: DC | PRN
  Administered 2024-02-23: 50 mg via ORAL
  Filled 2024-02-22: qty 1

## 2024-02-22 MED ORDER — SEVELAMER CARBONATE 800 MG PO TABS
1600.0000 mg | ORAL_TABLET | Freq: Three times a day (TID) | ORAL | Status: DC
  Administered 2024-02-22 – 2024-02-24 (×4): 1600 mg via ORAL
  Filled 2024-02-22 (×5): qty 2

## 2024-02-22 MED ORDER — ONDANSETRON HCL 4 MG/2ML IJ SOLN
4.0000 mg | Freq: Four times a day (QID) | INTRAMUSCULAR | Status: DC | PRN
Start: 2024-02-22 — End: 2024-02-24

## 2024-02-22 MED ORDER — PANTOPRAZOLE SODIUM 40 MG PO TBEC
40.0000 mg | DELAYED_RELEASE_TABLET | Freq: Two times a day (BID) | ORAL | Status: DC
  Administered 2024-02-22 – 2024-02-24 (×5): 40 mg via ORAL
  Filled 2024-02-22 (×5): qty 1

## 2024-02-22 NOTE — Consult Note (Signed)
 Chief Complaint: Presacral fluid collection and AV graft malfunction  Referring Provider(s): Dr. Emil (ED provider)  Supervising Physician: Luverne Aran  Patient Status: [facility] - In-pt  History of Present Illness: Weston Kallman is a 88 y.o. male with PMH significant for afib, ESRD (HD), obstructive uropathy (L PCN), stroke, DVT .  He is followed by IR for chronic presacral abscess. The patient was last seen in IR clinic 8/15. Patient had undergone presacral abscess drain placements 07/2023 and 11/2023 with both drains becoming unintentionally displaced. At a clinic visit in 12/18/23 the drain continued to have substantial output, and injection revealed fistulization. The drain was downsized from a 10 fr to an 8 Fr. He was scheduled to return for follow up a few weeks later, but his drain became dislodged during that period and was again replaced. At 8/15 clinic visit, the current drain was just outside collection. Dr. Jennefer attempted to advance the catheter over a wire back into the fluid collection but was unable under fluoro. Plan was made for patient to return OP for drain replacement in CT. This would have been 8/19 if he was not admitted in meantime. IR will work to complete this while IP.  Additionally, was told Friday that his graft was clogged and did not get HD that day or since because of this (last tx Thurs). This brought him back to hospital today. IR spoke to nephrology, no need for urgent line placement today. IR consulted for declot procedure.   Denies fever, chills, SOB, abd pain. Endorses blood tinged rectal drainage similar to what he has experienced in the past when there is no drain in presacral abscess.   Allergies Reviewed:  Baclofen, Cephalosporins, Ciprofloxacin, and Wound dressing adhesive   Patient is Full Code  Past Medical History:  Diagnosis Date   A-fib (HCC)    Anemia    Arthritis    Cancer (HCC)    Basal cell   COVID-19    2021   Dysrhythmia     Afib-controlled on eliquis    ESRD (end stage renal disease) (HCC) 10/22/2021   Glaucoma 11/18/2021   History of DVT (deep vein thrombosis)    Hydronephrosis    managed wtih a PCN   Idiopathic neuropathy 10/22/2021   lyrica     Ileostomy in place [facility])    Obstructive uropathy    With chronic left nephrostomy   Old retinal detachment, total or subtotal    Orthostatic hypotension 10/22/2021   Sleep apnea    does not need a machine   Stroke (HCC)    Ulcerative colitis (HCC)    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    Past Surgical History:  Procedure Laterality Date   BASAL CELL CARCINOMA EXCISION     10/23   COLON SURGERY     creation of j pouch     and subsequent takedown of j pouch   EYE SURGERY     IR CATHETER TUBE CHANGE  12/18/2023   IR CATHETER TUBE CHANGE  02/19/2024   IR CATHETER TUBE CHANGE  02/19/2024   IR NEPHROSTOMY EXCHANGE LEFT  12/10/2021   IR NEPHROSTOMY EXCHANGE LEFT  04/22/2022   IR NEPHROSTOMY EXCHANGE LEFT  07/29/2022   IR NEPHROSTOMY EXCHANGE LEFT  10/28/2022   IR NEPHROSTOMY EXCHANGE LEFT  02/05/2023   IR NEPHROSTOMY EXCHANGE LEFT  05/07/2023   IR NEPHROSTOMY EXCHANGE LEFT  06/25/2023   IR NEPHROSTOMY EXCHANGE LEFT  07/17/2023   IR NEPHROSTOMY EXCHANGE LEFT  10/22/2023   IR RADIOLOGIST EVAL & MGMT  11/25/2023   IR RADIOLOGIST EVAL & MGMT  12/18/2023   IR RADIOLOGIST EVAL & MGMT  02/19/2024   LEFT HEART CATH AND CORONARY ANGIOGRAPHY N/A 07/22/2022   Procedure: LEFT HEART CATH AND CORONARY ANGIOGRAPHY;  Surgeon: Swaziland, Peter M, MD;  Location: [facility] INVASIVE CV LAB;  Service: Cardiovascular;  Laterality: N/A;   REVISION OF ARTERIOVENOUS GORETEX GRAFT Left 05/06/2022   Procedure: REDO LEFT THIGH ARTERIOVENOUS 4-7 MM GORETEX GRAFT;  Surgeon: Eliza Lonni RAMAN, MD;  Location: [facility] OR;  Service: Vascular;  Laterality: Left;   SMALL INTESTINE SURGERY     TOTAL COLECTOMY        Medications: Prior to Admission medications   Medication Sig  Start Date End Date Taking? Authorizing Provider  albuterol  (PROVENTIL ) (2.5 MG/3ML) 0.083% nebulizer solution Take 2.5 mg by nebulization every 6 (six) hours as needed for wheezing or shortness of breath.   Yes [provider]  albuterol  (VENTOLIN  HFA) 108 (90 Base) MCG/ACT inhaler Inhale 1-2 puffs into the lungs every 6 (six) hours as needed for wheezing or shortness of breath. Patient taking differently: Inhale 2 puffs into the lungs 2 (two) times daily. 02/11/24  Yes Katrinka Garnette KIDD, MD  Cyanocobalamin (VITAMIN B-12 PO) Take 1 capsule by mouth in the morning and at bedtime.   Yes [provider]  dorzolamide  (TRUSOPT ) 2 % ophthalmic solution Place 1 drop into the right eye at bedtime. 06/17/23  Yes [provider]  Doxylamine Succinate, Sleep, (SLEEP AID PO) Take 1 Dose by mouth at bedtime. Sleep aid of unknown name. OTC.   Yes [provider]  ELIQUIS  2.5 MG TABS tablet Take 1 tablet (2.5 mg total) by mouth 2 (two) times daily. 02/17/24  Yes Katrinka Garnette KIDD, MD  gabapentin  (NEURONTIN ) 300 MG capsule Take 300 mg by mouth daily as needed (Pain). 09/09/23  Yes [provider]  midodrine  (PROAMATINE ) 10 MG tablet Take 3 tablets (30 mg total) by mouth 3 (three) times daily. 02/03/24  Yes Hunsucker, Donnice SAUNDERS, MD  multivitamin (RENA-VIT) TABS tablet Take 1 tablet by mouth daily.   Yes [provider]  Omega Fatty Acids-Vitamins (OMEGA-3 GUMMIES) CHEW Chew 1 tablet by mouth daily.   Yes [provider]  ramelteon  (ROZEREM ) 8 MG tablet Take 1 tablet (8 mg total) by mouth at bedtime. 10/27/23  Yes Katrinka Garnette KIDD, MD  sevelamer  carbonate (RENVELA ) 800 MG tablet Take 1,600 mg by mouth 3 (three) times daily with meals.   Yes [provider]  timolol  (TIMOPTIC ) 0.5 % ophthalmic solution Place 1 drop into both eyes in the morning and at bedtime.   Yes [provider]  traMADol  (ULTRAM ) 50 MG tablet Take 1 tablet (50 mg total) by  mouth every 8 (eight) hours as needed (for pain). 12/03/23  Yes Danford, Lonni SQUIBB, MD  TYLENOL  325 MG tablet Take 325-650 mg by mouth every 6 (six) hours as needed for mild pain (pain score 1-3) or headache.   Yes [provider]  atorvastatin  (LIPITOR) 20 MG tablet Take 1 tablet (20 mg total) by mouth daily. Patient not taking: No sig reported 11/14/23   Sebastian Toribio GAILS, MD  budesonide  (PULMICORT ) 0.5 MG/2ML nebulizer solution Take 0.5 mg by nebulization 2 (two) times daily as needed (for respiratory flares). Patient not taking: Reported on 02/08/2024    [provider]  calcitRIOL  (ROCALTROL ) 0.5 MCG capsule Take 1 capsule (0.5 mcg total) by mouth every Monday,  Wednesday, and Friday with hemodialysis. Patient not taking: Reported on 02/22/2024 12/04/23   Jonel Lonni SQUIBB, MD  fludrocortisone  (FLORINEF ) 0.1 MG tablet Take 1 tablet (0.1 mg total) by mouth daily. Patient not taking: Reported on 02/22/2024 12/04/23   Jonel Lonni SQUIBB, MD  folic acid  (FOLVITE ) 1 MG tablet Take 1 tablet (1 mg total) by mouth daily. Patient not taking: Reported on 02/22/2024 07/21/23   Regalado, Owen A, MD  hydrocortisone  (CORTEF ) 10 MG tablet Take 2 tablets (20 mg total) by mouth daily and take 1 tablet (10 mg total) by mouth every evening. Patient not taking: Reported on 02/22/2024 02/11/24   Katrinka Garnette KIDD, MD  hydrocortisone  (CORTEF ) 20 MG tablet Take 1 tablet (20 mg total) by mouth daily. Patient not taking: Reported on 02/22/2024 02/11/24   Katrinka Garnette KIDD, MD  mometasone -formoterol  (DULERA ) 200-5 MCG/ACT AERO Inhale 2 puffs into the lungs 2 (two) times daily. Patient not taking: No sig reported 09/08/23   Rosario Leatrice FERNS, MD  pantoprazole  (PROTONIX ) 40 MG tablet Take 1 tablet (40 mg total) by mouth 2 (two) times daily. Patient not taking: Reported on 02/22/2024 11/13/23   Sebastian Toribio GAILS, MD  sertraline  (ZOLOFT ) 50 MG tablet Take 25 mg by mouth at bedtime. Patient not taking:  Reported on 02/22/2024    [provider]  Sodium Chloride  Flush (SALINE FLUSH) 0.9 % SOLN Use 5 mLs by Intracatheter route daily as directed. 11/25/23   Caperilla, Daved SAILOR, PA     Family History  Problem Relation Age of Onset   Stroke Mother    Cancer Father    Esophageal cancer Brother     Social History   Socioeconomic History   Marital status: Widowed    Spouse name: Not on file   Number of children: Not on file   Years of education: Not on file   Highest education level: 12th grade  Occupational History   Not on file  Tobacco Use   Smoking status: Former    Current packs/day: 0.00    Average packs/day: 2.0 packs/day for 6.0 years (12.0 ttl pk-yrs)    Types: Cigarettes    Start date: 2    Quit date: 35    Years since quitting: 40.6    Passive exposure: Never   Smokeless tobacco: Never  Vaping Use   Vaping status: Never Used  Substance and Sexual Activity   Alcohol use: Yes    Alcohol/week: 5.0 standard drinks of alcohol    Types: 5 Shots of liquor per week    Comment: socially   Drug use: Never   Sexual activity: Not Currently  Other Topics Concern   Not on file  Social History Narrative   Widowed- lost wife of 13 years to pancreatic cancer- previously married 43 years. Son in [place] and daughter helping in [place]. 4 grandkids.       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       Hobbies: dinner with family- occasional martini   Social Drivers of Health   Financial Resource Strain: Low Risk  (10/26/2023)   Overall Financial Resource Strain (CARDIA)    Difficulty of Paying Living Expenses: Not hard at all  Food Insecurity: No Food Insecurity (02/22/2024)   Hunger Vital Sign    Worried About Running Out of Food in the Last Year: Never true    Ran Out of Food in the Last Year: Never true  Transportation Needs: No Transportation Needs (02/22/2024)  PRAPARE - Administrator, Civil Service (Medical): No    Lack of  Transportation (Non-Medical): No  Physical Activity: Insufficiently Active (10/26/2023)   Exercise Vital Sign    Days of Exercise per Week: 1 day    Minutes of Exercise per Session: 10 min  Stress: Stress Concern Present (10/26/2023)   [employer] of Occupational Health - Occupational Stress Questionnaire    Feeling of Stress : To some extent  Social Connections: Patient Declined (02/22/2024)   Social Connection and Isolation Panel    Frequency of Communication with Friends and Family: Patient declined    Frequency of Social Gatherings with Friends and Family: Patient declined    Attends Religious Services: Patient declined    Database administrator or Organizations: Patient declined    Attends Banker Meetings: Patient declined    Marital Status: Patient declined  Recent Concern: Social Connections - Socially Isolated (01/27/2024)   Social Connection and Isolation Panel    Frequency of Communication with Friends and Family: More than three times a week    Frequency of Social Gatherings with Friends and Family: More than three times a week    Attends Religious Services: Never    Database administrator or Organizations: No    Attends Banker Meetings: Never    Marital Status: Widowed     Review of Systems: A 12 point ROS discussed and pertinent positives are indicated in the HPI above.  All other systems are negative.    Vital Signs: BP (!) 107/93 (BP Location: Left Arm)   Pulse 78   Temp 98 F (36.7 C) (Oral)   Resp 19   Ht 5' 8 (1.727 m)   Wt 158 lb 11.7 oz (72 kg)   SpO2 99%   BMI 24.14 kg/m   Advance Care Plan: No documents on file    Physical Exam HENT:     Ears:     Comments: Hard of hearing    Mouth/Throat:     Mouth: Mucous membranes are moist.     Pharynx: Oropharynx is clear.  Cardiovascular:     Rate and Rhythm: Normal rate.     Pulses: Normal pulses.     Heart sounds: Normal heart sounds.  Pulmonary:     Effort:  Pulmonary effort is normal.     Breath sounds: Normal breath sounds.  Abdominal:     Palpations: Abdomen is soft.     Tenderness: There is no abdominal tenderness.  Genitourinary:    Comments: L PCN intact, yellow urine in collection, insertion with scant old blood on dressing Skin:    General: Skin is warm and dry.  Neurological:     Mental Status: He is alert and oriented to person, place, and time.  Psychiatric:        Mood and Affect: Mood normal.        Behavior: Behavior normal.        Thought Content: Thought content normal.        Judgment: Judgment normal.     Imaging: DG Chest Port 1 View Result Date: 02/22/2024 CLINICAL DATA:  Clogged dialysis graft in the left groin. EXAM: PORTABLE CHEST 1 VIEW COMPARISON:  01/25/2024 and CT chest 11/09/2023. FINDINGS: Trachea is midline. Heart is enlarged, stable. Trace interstitial prominence and indistinctness with small bilateral pleural effusions. IMPRESSION: Mild congestive heart failure. Electronically Signed   By: Newell Eke M.D.   On: 02/22/2024 08:22   IR Catheter  Tube Change Result Date: 02/19/2024 CLINICAL DATA:  Patient with a history of chronic presacral abscess with drain placement in IR 01/07/2024 EXAM: IR CATHETER TUBE CHANGE COMPARISON:  None Available. CONTRAST:  10 mL Omnipaque  300-administered via the existing percutaneous drain. FLUOROSCOPY TIME:  Radiation exposure index as provided by the fluoroscopic device 91.7 mGy TECHNIQUE: The patient was positioned prone on the fluoroscopy table. A pre-procedural spot fluoroscopic image was obtained of the presacral/pelvic area and the existing percutaneous drainage catheter. Multiple spot fluoroscopic and radiographic images were obtained following the injection of a small amount of contrast via the existing percutaneous drainage catheter. FINDINGS: Contrast injection confirmed the catheter was not in communication with the presacral abscess. The external portion of the  percutaneous drainage catheter was cut and cannulated with a short wire. Under intermittent fluoroscopic guidance the existing percutaneous drainage catheter was removed. A new 8 French mini loop percutaneous drainage catheter was passed over the wire without successful placement into the abscess collection. The catheter was removed and the site was covered with an appropriate dressing. IMPRESSION: Existing percutaneous catheter was retracted and not in communication with the presacral abscess. A drain exchange was attempted but placement into the abscess collection was unsuccessful. The patient will be scheduled early next week for new drain placement under CT guidance with moderate sedation. Electronically Signed   By: Ester Sides M.D.   On: 02/19/2024 16:45   IR Catheter Tube Change Result Date: 02/19/2024 INDICATION: Left percutaneous nephrostomy tube requiring routine exchange. EXAM: FLUOROSCOPIC GUIDED left SIDED NEPHROSTOMY CATHETER EXCHANGE COMPARISON:  IR nephrostomy exchange left 10/22/2023 CONTRAST:  10 mL Isovue -300 administered into the collecting system FLUOROSCOPY TIME:  Radiation exposure index as provided by the fluoroscopic device 91.7 mGy Karma COMPLICATIONS: None immediate. TECHNIQUE: Informed written consent was obtained from the patient after a discussion of the risks, benefits and alternatives to treatment. Questions regarding the procedure were encouraged and answered. A timeout was performed prior to the initiation of the procedure. The left flank and external portion of existing nephrostomy catheter were prepped and draped in the usual sterile fashion. A sterile drape was applied covering the operative field. Maximum barrier sterile technique with sterile gowns and gloves were used for the procedure. A timeout was performed prior to the initiation of the procedure. A pre procedural spot fluoroscopic image was obtained after contrast was injected via the existing nephrostomy catheter  demonstrating appropriate positioning within the renal pelvis. The existing nephrostomy catheter was cut and cannulated with a Benson wire which was coiled within the renal pelvis. Under intermittent fluoroscopic guidance, the existing nephrostomy catheter was exchanged for a new 10 [place] all-purpose drainage catheter. Contrast injection confirmed appropriate positioning within the renal pelvis and a post exchange fluoroscopic image was obtained. The catheter was locked and secured to the skin with a suture. A dressing was placed. The patient tolerated the procedure well without immediate postprocedural complication. FINDINGS: The existing nephrostomy catheter is appropriately positioned and functioning. After successful fluoroscopic guided exchange, the new nephrostomy catheter is coiled and locked within the left renal pelvis. IMPRESSION: Successful fluoroscopic guided exchange of left sided 10.2 French percutaneous nephrostomy catheter. Procedure performed by Warren Dais, NP Electronically Signed   By: Ester Sides M.D.   On: 02/19/2024 16:44   IR Radiologist Eval & Mgmt Result Date: 02/19/2024 EXAM: See EPIC note CHIEF COMPLAINT: See EPIC note HISTORY OF PRESENT ILLNESS: See EPIC note REVIEW OF SYSTEMS: See EPIC note PHYSICAL EXAMINATION: See EPIC note ASSESSMENT AND PLAN: See  EPIC note Electronically Signed   By: Ester Sides M.D.   On: 02/19/2024 16:12   ECHOCARDIOGRAM LIMITED Result Date: 01/29/2024    ECHOCARDIOGRAM LIMITED REPORT   Patient Name:   KONG PACKETT Date of Exam: 01/29/2024 Medical Rec #:  [id number]       Height:       68.0 in Accession #:    [id number]      Weight:       164.9 lb Date of Birth:  July 26, 1935       BSA:          1.883 m Patient Age:    87 years        BP:           106/71 mmHg Patient Gender: M               HR:           87 bpm. Exam Location:  Inpatient Procedure: Limited Echo (Both Spectral and Color Flow Doppler were utilized            during procedure).  Indications:    I31.3 Pericardial effusion (noninflammatory)  History:        Patient has prior history of Echocardiogram examinations, most                 recent 01/26/2024. COPD and ESRD, Arrythmias:Atrial Fibrillation;                 Risk Factors:Sleep Apnea.  Sonographer:    Damien Senior RDCS Referring Phys: [id number] BRIDGETTE CHRISTOPHER  Sonographer Comments: Limited to reassess effusion IMPRESSIONS  1. Effusion appears much smaller than prior. Epicardial fat visualized. There is no evidence of cardiac tamponade.  2. The inferior vena cava is dilated in size with <50% respiratory variability, suggesting right atrial pressure of 15 mmHg. FINDINGS  Left Ventricle: Pericardium: Effusion appears much smaller than prior. Epicardial fat visualized. Trivial pericardial effusion is present. There is no evidence of cardiac tamponade. Venous: The inferior vena cava is dilated in size with less than 50% respiratory variability, suggesting right atrial pressure of 15 mmHg. Morene Brownie Electronically signed by Morene Brownie Signature Date/Time: 01/29/2024/1:33:26 PM    Final    DG Abd Portable 1V Result Date: 01/29/2024 CLINICAL DATA:  [id number].  Small-bowel obstruction. EXAM: PORTABLE ABDOMEN - 1 VIEW COMPARISON:  Portable abdomen film yesterday at 9:31 a.m. FINDINGS: 4:50 a.m. Again noted are a left percutaneous nephrostomy and a left-sided transgluteal pelvic pigtail drainage catheter, right-sided pelvic penile implant reservoir. There is an ostomy port again in the right mid abdomen. No dilated small bowel is seen, no supine evidence of free air. 2.6 cm rim calcified gallstone again noted right upper abdomen. No urinary stone is evident. NGT is in place with tip in the distal stomach. Cardiomegaly is partially visible. Osteopenia and degenerative change thoracic and lumbar spine. Overall bowel aeration unchanged since yesterday. IMPRESSION: 1. No dilated small bowel is seen, no supine evidence of free air. 2. NGT  tip in the distal stomach. 3. Cholelithiasis. 4. Cardiomegaly. 5. Left percutaneous nephrostomy and left-sided transgluteal pelvic pigtail drainage catheter. Electronically Signed   By: Francis Quam M.D.   On: 01/29/2024 06:12   DG Abd Portable 1V Result Date: 01/28/2024 CLINICAL DATA:  Small-bowel obstruction, abdominal pain EXAM: PORTABLE ABDOMEN - 1 VIEW COMPARISON:  01/27/2024 FINDINGS: Left percutaneous nephrostomy and left pelvic transgluteal pigtail drainage catheter are unchanged from prior CT examination 01/25/2024. Penile prosthesis in place with  reservoir noted within the right hemipelvis. Ostomy appliance noted within the right mid abdomen. Normal abdominal gas pattern. No gross free intraperitoneal gas. No nephro or urolithiasis. No acute bone abnormality. IMPRESSION: 1. Nonobstructive bowel gas pattern. 2. Left percutaneous nephrostomy and left pelvic transgluteal drainage catheter in place. Electronically Signed   By: Dorethia Molt M.D.   On: 01/28/2024 13:24   VAS US  LOWER EXTREMITY VENOUS (DVT) Result Date: 01/28/2024  Lower Venous DVT Study Patient Name:  NIKASH MORTENSEN  Date of Exam:   01/27/2024 Medical Rec #: [id number]        Accession #:    [id number] Date of Birth: [birth year]        Patient Gender: M Patient Age:   3 years Exam Location:  [facility] Procedure:      VAS US  LOWER EXTREMITY VENOUS (DVT) Referring Phys: NORLEEN PAYNE --------------------------------------------------------------------------------  Indications: Swelling, and Edema.  Limitations: Bandages and urine bag on left thigh. Comparison Study: No prior Performing Technologist: Lataco Crump,RVT  Examination Guidelines: A complete evaluation includes B-mode imaging, spectral Doppler, color Doppler, and power Doppler as needed of all accessible portions of each vessel. Bilateral testing is considered an integral part of a complete examination. Limited examinations for reoccurring indications may be performed as  noted. The reflux portion of the exam is performed with the patient in reverse Trendelenburg.  +---------+---------------+---------+-----------+----------+--------------+ RIGHT    CompressibilityPhasicitySpontaneityPropertiesThrombus Aging +---------+---------------+---------+-----------+----------+--------------+ CFV      Full           Yes      Yes                                 +---------+---------------+---------+-----------+----------+--------------+ SFJ      Full                                                        +---------+---------------+---------+-----------+----------+--------------+ FV Prox  Full                                                        +---------+---------------+---------+-----------+----------+--------------+ FV Mid   Full                                                        +---------+---------------+---------+-----------+----------+--------------+ FV DistalFull                                                        +---------+---------------+---------+-----------+----------+--------------+ PFV      Full                                                        +---------+---------------+---------+-----------+----------+--------------+  POP      Full           Yes      Yes                                 +---------+---------------+---------+-----------+----------+--------------+ PTV      Full                                                        +---------+---------------+---------+-----------+----------+--------------+ PERO     Full                                                        +---------+---------------+---------+-----------+----------+--------------+   +---------+---------------+---------+-----------+----------+-------------------+ LEFT     CompressibilityPhasicitySpontaneityPropertiesThrombus Aging      +---------+---------------+---------+-----------+----------+-------------------+ CFV                                                    bandage             +---------+---------------+---------+-----------+----------+-------------------+ SFJ                                                   bandage             +---------+---------------+---------+-----------+----------+-------------------+ FV Prox  Full                                                             +---------+---------------+---------+-----------+----------+-------------------+ FV Mid   Full                                                             +---------+---------------+---------+-----------+----------+-------------------+ FV DistalFull                                         Thckened walls      +---------+---------------+---------+-----------+----------+-------------------+ PFV                                                   Not well visualized +---------+---------------+---------+-----------+----------+-------------------+ POP      Full  thickened walls     +---------+---------------+---------+-----------+----------+-------------------+ PTV      Full                                                             +---------+---------------+---------+-----------+----------+-------------------+ PERO                                                  Not well visualized +---------+---------------+---------+-----------+----------+-------------------+     Summary: RIGHT: - There is no evidence of deep vein thrombosis in the lower extremity.  - No cystic structure found in the popliteal fossa.  LEFT: - There is no evidence of deep vein thrombosis in the lower extremity. However, portions of this examination were limited- see technologist comments above.  - No cystic structure found in the popliteal fossa.  *See table(s) above for measurements and observations. Electronically signed by Lonni Gaskins MD on 01/28/2024 at 8:29:23 AM.    Final     DG Abd Portable 1V Result Date: 01/27/2024 CLINICAL DATA:  Small-bowel obstruction. EXAM: PORTABLE ABDOMEN - 1 VIEW COMPARISON:  Radiographs 01/26/2024 and 01/25/2024.  CT 01/25/2024. FINDINGS: 0910 hours. Two supine views of the abdomen are submitted. The most recent examination was labeled an 8 hour delay for portable small-bowel follow-through study. Contrast has progressed into dilated small bowel and is diluted. Right mid abdominal ileostomy noted. The tip of the enteric tube is incompletely visualized, although is similar in position within the right upper quadrant, likely in the distal stomach or proximal duodenum. Percutaneous nephrostomy and surgical drain are in place. Penile prosthesis noted. IMPRESSION: Contrast has progressed into dilated small bowel and is diluted. Findings are consistent with persistent small bowel obstruction. Electronically Signed   By: Elsie Perone M.D.   On: 01/27/2024 10:58   DG Abd Portable 1V-Small Bowel Obstruction Protocol-initial, 8 hr delay Result Date: 01/26/2024 CLINICAL DATA:  8 hour delay bowel obstruction EXAM: PORTABLE ABDOMEN - 1 VIEW COMPARISON:  01/26/2024, CT 01/25/2024 FINDINGS: Enteric tube tip overlies the gastroduodenal region. Enteral contrast within the stomach and small bowel. Dilute contrast present within dilated small bowel loops measuring up to 5.1 cm, down to the pelvis. No contrast visible in the vicinity of the right abdominal ostomy. Patient has history of proctocolectomy. Percutaneous nephrostomy tube in the left abdomen. Drainage catheter in the pelvis. Radial lucent reservoir in the right pelvis for penile implant. IMPRESSION: Enteral contrast within the stomach with dilute contrast within dilated proximal to mid small bowel to the level of the pelvis. No enteral contrast visible in the region of the patient's right abdominal ileostomy. Electronically Signed   By: Luke Bun M.D.   On: 01/26/2024 22:19   DG Abd Portable 1V-Small  Bowel Protocol-Position Verification Result Date: 01/26/2024 CLINICAL DATA:  Nasogastric tube placement. EXAM: PORTABLE ABDOMEN - 1 VIEW COMPARISON:  January 25, 2024. FINDINGS: Distal tip of nasogastric tube is seen in expected position of distal stomach. Left-sided nephrostomy is again noted. IMPRESSION: Nasogastric tube tip seen in expected position of distal stomach. Electronically Signed   By: Lynwood Landy Raddle M.D.   On: 01/26/2024 13:36   ECHOCARDIOGRAM COMPLETE Result Date: 01/26/2024    ECHOCARDIOGRAM  REPORT   Patient Name:   JEWELL RYANS Date of Exam: 01/26/2024 Medical Rec #:  [id number]       Height:       68.0 in Accession #:    [id number]      Weight:       163.6 lb Date of Birth:  1935-11-24       BSA:          1.876 m Patient Age:    87 years        BP:           98/67 mmHg Patient Gender: M               HR:           86 bpm. Exam Location:  Inpatient Procedure: 2D Echo, Color Doppler and Cardiac Doppler (Both Spectral and Color            Flow Doppler were utilized during procedure). Indications:    I31.3 Pericardial effusion  History:        Patient has prior history of Echocardiogram examinations, most                 recent 11/10/2023. COPD, Arrythmias:Atrial Fibrillation; Risk                 Factors:Sleep Apnea.  Sonographer:    Damien Senior RDCS Referring Phys: [id number] SUBRINA SUNDIL IMPRESSIONS  1. Left ventricular ejection fraction, by estimation, is 60 to 65%. The left ventricle has normal function. The left ventricle has no regional wall motion abnormalities. There is mild asymmetric left ventricular hypertrophy of the septal segment. Left ventricular diastolic parameters are indeterminate.  2. Right ventricular systolic function is normal. The right ventricular size is normal. There is normal pulmonary artery systolic pressure. The estimated right ventricular systolic pressure is 33.0 mmHg.  3. Moderate pericardial effusion. The pericardial effusion is circumferential. No evidence of right  ventricular collapse or right atrial collapse.  4. The mitral valve is degenerative. Trivial mitral valve regurgitation.  5. The aortic valve has an indeterminant number of cusps. Aortic valve regurgitation is trivial. Aortic valve sclerosis is present, with no evidence of aortic valve stenosis.  6. The inferior vena cava is dilated in size with <50% respiratory variability, suggesting right atrial pressure of 15 mmHg. Comparison(s): Prior images reviewed side by side. New pericardial effusion. FINDINGS  Left Ventricle: Left ventricular ejection fraction, by estimation, is 60 to 65%. The left ventricle has normal function. The left ventricle has no regional wall motion abnormalities. The left ventricular internal cavity size was normal in size. There is  mild asymmetric left ventricular hypertrophy of the septal segment. Left ventricular diastolic function could not be evaluated due to atrial fibrillation. Left ventricular diastolic parameters are indeterminate. Right Ventricle: The right ventricular size is normal. No increase in right ventricular wall thickness. Right ventricular systolic function is normal. There is normal pulmonary artery systolic pressure. The tricuspid regurgitant velocity is 2.12 m/s, and  with an assumed right atrial pressure of 15 mmHg, the estimated right ventricular systolic pressure is 33.0 mmHg. Left Atrium: Left atrial size was normal in size. Right Atrium: Right atrial size was normal in size. Pericardium: A moderately sized pericardial effusion is present. The pericardial effusion is circumferential. The pericardial effusion appears to contain fibrous material. Mitral Valve: The mitral valve is degenerative in appearance. Trivial mitral valve regurgitation. Tricuspid Valve: The tricuspid valve is normal in structure. Tricuspid valve regurgitation is mild. Aortic  Valve: The aortic valve has an indeterminant number of cusps. Aortic valve regurgitation is trivial. Aortic valve sclerosis  is present, with no evidence of aortic valve stenosis. Pulmonic Valve: The pulmonic valve was normal in structure. Pulmonic valve regurgitation is not visualized. Aorta: The aortic root is normal in size and structure and the ascending aorta was not well visualized. Venous: The inferior vena cava is dilated in size with less than 50% respiratory variability, suggesting right atrial pressure of 15 mmHg. IAS/Shunts: No atrial level shunt detected by color flow Doppler.  LEFT VENTRICLE PLAX 2D LVIDd:         3.10 cm LVIDs:         2.20 cm LV PW:         0.90 cm LV IVS:        1.10 cm LVOT diam:     1.90 cm LV SV:         42 LV SV Index:   22 LVOT Area:     2.84 cm  RIGHT VENTRICLE RV S prime:     9.64 cm/s TAPSE (M-mode): 1.7 cm LEFT ATRIUM             Index        RIGHT ATRIUM           Index LA diam:        3.70 cm 1.97 cm/m   RA Area:     21.30 cm LA Vol (A2C):   52.1 ml 27.77 ml/m  RA Volume:   57.80 ml  30.80 ml/m LA Vol (A4C):   61.2 ml 32.62 ml/m LA Biplane Vol: 58.9 ml 31.39 ml/m  AORTIC VALVE LVOT Vmax:   75.00 cm/s LVOT Vmean:  57.100 cm/s LVOT VTI:    0.147 m  AORTA Ao Root diam: 3.70 cm TRICUSPID VALVE TR Peak grad:   18.0 mmHg TR Vmax:        212.00 cm/s  SHUNTS Systemic VTI:  0.15 m Systemic Diam: 1.90 cm Stanly Leavens MD Electronically signed by Stanly Leavens MD Signature Date/Time: 01/26/2024/12:27:36 PM    Final    DG Abdomen 1 View Result Date: 01/25/2024 CLINICAL DATA:  NG tube placement. EXAM: ABDOMEN - 1 VIEW COMPARISON:  September 07, 2023 FINDINGS: A nasogastric tube is seen with its distal tip overlying the expected region of the gastric antrum. The bowel gas pattern is normal. A left-sided percutaneous of proximally tube is in place. No radio-opaque calculi or other significant radiographic abnormality are seen. IMPRESSION: Nasogastric tube positioning, as described above. Electronically Signed   By: Suzen Dials M.D.   On: 01/25/2024 23:06   CT ABDOMEN PELVIS W  CONTRAST Result Date: 01/25/2024 CLINICAL DATA:  Pain nausea reduced ostomy output EXAM: CT ABDOMEN AND PELVIS WITH CONTRAST TECHNIQUE: Multidetector CT imaging of the abdomen and pelvis was performed using the standard protocol following bolus administration of intravenous contrast. RADIATION DOSE REDUCTION: This exam was performed according to the departmental dose-optimization program which includes automated exposure control, adjustment of the mA and/or kV according to patient size and/or use of iterative reconstruction technique. CONTRAST:  OMNIPAQUE  IOHEXOL  300 MG/ML  SOLN COMPARISON:  CT 12/30/2023, 12/18/2023 FINDINGS: Lower chest: Lung bases demonstrate atelectasis or scarring at the bases. Cardiomegaly. Moderate pericardial effusion, increased compared to prior, measures up to 19 mm maximum thickness along the right cardiac border, and exhibits mild diffuse rim enhancement. Slight increased density values within the fluid. Hepatobiliary: Gallstone. No focal hepatic abnormality or biliary dilatation  Pancreas: Unremarkable. No pancreatic ductal dilatation or surrounding inflammatory changes. Spleen: Normal in size without focal abnormality. Adrenals/Urinary Tract: Adrenal glands are normal. Atrophic kidneys without hydronephrosis. Left percutaneous nephrostomy catheter with decreased collecting system. Renal cysts for which no imaging follow-up is recommended. Thick-walled collapsed appearance of the urinary bladder with mild perivesical stranding. Stomach/Bowel: Stomach is moderately distended with fluid. Interim development of multiple dilated fluid-filled loops of proximal to mid small bowel, measuring up to 4.4 cm. Fecalized small bowel in the pelvis. Adhesed appearance of small-bowel loops to the anterior abdominal wall below the level of umbilicus. Decompressed small bowel in the region and remaining distal small bowel is decompressed. Patient is status post colectomy with right abdominal  ileostomy. Small parastomal hernia contains fat and a knuckle of small bowel. Vascular/Lymphatic: Aortic atherosclerosis. No enlarged abdominal or pelvic lymph nodes. Reproductive: Slightly enlarged prostate Other: No free air. Left trans gluteal percutaneous drainage catheter since the prior exam with pigtail in the presacral region at the site of prior fluid collection. Residual soft tissue thickening in the region with small residual fluid inferior to the pigtail measuring about 14 x 16 mm on series 2, image 71. Musculoskeletal: No acute osseous abnormality. 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 Dialysis graft in the left thigh and groin with similar small fluid collection in the left groin measuring 19 mm, previously 19 mm. Small fluid collection measuring 14 mm adjacent to the lateral/arterial limb of the graft in the thigh, stable since 2024. IMPRESSION: 1. Status post proctocolectomy with right abdominal ileostomy. Small parastomal hernia contains fat and a knuckle of small bowel. Interim development of moderate to marked fluid distension of the stomach with multiple dilated fluid-filled loops of proximal to mid small bowel and fecalized bowel in the pelvis consistent with small bowel obstruction. Transition point appears to be within the low anterior pelvis where there is adhesed appearance of multiple small bowel loops. 2. Left trans gluteal percutaneous drainage catheter with pigtail in the presacral region at the site of prior fluid collection. Residual soft tissue thickening in the region with small residual fluid collection inferior to the pigtail measuring about 14 x 16 mm. 3. Moderate pericardial effusion, increased compared to prior, exhibits mild diffuse rim enhancement. Slight increased density values within the fluid. Correlate for infection/pericarditis. 4. Gallstone. 5. Thick-walled collapsed appearance of the urinary bladder with mild perivesical stranding. 6. Left  percutaneous nephrostomy catheter with no hydronephrosis. Atrophic native kidneys. Aortic Atherosclerosis (ICD10-I70.0). Electronically Signed   By: Luke Bun M.D.   On: 01/25/2024 20:40   DG Chest Port 1 View Result Date: 01/25/2024 CLINICAL DATA:  Concern for sepsis EXAM: PORTABLE CHEST 1 VIEW COMPARISON:  None Available. FINDINGS: 11/27/2023 stable enlarged cardiac silhouette. No effusion, infiltrate, pneumothorax. Mild scarring at LEFT lung base. RIGHT subclavian stent. IMPRESSION: 1. No acute cardiopulmonary process. 2. Cardiomegaly. Electronically Signed   By: Jackquline Boxer M.D.   On: 01/25/2024 19:36    Labs:  CBC: Recent Labs    01/31/24 0320 02/01/24 0300 02/02/24 0700 02/22/24 0804 02/22/24 0854  WBC 8.2 9.3 8.6 6.2  --   HGB 8.8* 9.3* 9.1* 10.1* 11.6*  HCT 28.0* 29.7* 28.3* 34.8* 34.0*  PLT 212 179 171 116*  --     COAGS: Recent Labs    11/11/23 0005 11/11/23 0706 11/27/23 1412 01/07/24 0841 01/25/24 1854 01/27/24 0024 01/28/24 0349 01/29/24 0059 01/29/24 1515 01/30/24 0236  INR 1.3*  --  1.1 1.3* 1.3*  --   --   --   --   --  APTT  --    < >  --   --   --    < > 75* 68* 56* 90*   < > = values in this interval not displayed.    BMP: Recent Labs    01/31/24 0320 02/01/24 0300 02/02/24 0540 02/02/24 0700 02/22/24 0804 02/22/24 0854  NA 128* 127*  --  123* 137 133*  K 4.4 4.2  --  4.4 4.8 4.5  CL 92* 90*  --  88* 102 105  CO2 21* 20*  --  18* 17*  --   GLUCOSE 159* 110*  --  132* 153* 149*  BUN 61* 70*  --  86* 68* 61*  CALCIUM  8.0* 8.1*  --  8.1* 8.7*  --   CREATININE 6.82* 8.59* 9.83* 9.72* 10.32* 11.00*  GFRNONAA 7* 6* 5* 5* 4*  --     LIVER FUNCTION TESTS: Recent Labs    11/29/23 0230 11/30/23 0500 01/25/24 1854 01/27/24 1013 02/02/24 0700 02/22/24 0804  BILITOT 1.1  --  0.7 1.0  --  0.8  AST 23  --  28 19  --  21  ALT 16  --  27 16  --  24  ALKPHOS 56  --  144* 83  --  85  PROT 5.8*  --  7.9 6.4*  --  6.5  ALBUMIN  3.1*   <  > 4.4 3.1* 3.6 3.3*   < > = values in this interval not displayed.    TUMOR MARKERS: No results for input(s): AFPTM, CEA, CA199, CHROMGRNA in the last 8760 hours.  Assessment and Plan:  Request for  image guided presacral fluid collection drain placement and AV graft evaluation (possible thrombectomy, possible angioplasty, possible TDC) approved by Dr. Luverne.   Notably, the team will need to coordinate the procedures in 2 separate areas of IR which may result in delay of one procedure.   No contraindications for procedure identified in ROS, physical exam, or review of pre-sedation considerations. No WBC elevation. Labs available otherwise acceptable.  8/15 abd/pelv CT imaging available and reviewed VSS, afebrile   Risks and benefits discussed with the patient including bleeding, infection, damage to adjacent structures, bowel perforation/fistula connection, and sepsis.  All of the patient's questions were answered, patient is agreeable to proceed. Consent signed and in chart.  Risks and benefits of the dialysis circuit study were discussed with the patient including, but not limited to bleeding, infection, vascular injury, and inability to improve the function of the fistula/graft which would require placement of a tunneled dialysis catheter.  All of the patient's questions were answered, patient is agreeable to proceed. Consent signed and in chart.   Thank you for allowing our service to participate in Dejan Angert 's care.    Electronically Signed: Laymon Coast, NP   02/22/2024, 4:51 PM     I spent a total of 20 Minutes   in face to face in clinical consultation, greater than 50% of which was counseling/coordinating care for presacral drain and dialysis circuit study.   (A copy of this note was sent to the referring provider and the time of visit.)

## 2024-02-22 NOTE — Consult Note (Cosign Needed Addendum)
 Consultation Note Date: 02/22/2024   Patient Name: Joshua Riley  DOB: [birth year]  MRN: [id number]  Age / Sex: 88 y.o., male  PCP: Katrinka Garnette KIDD, MD Referring Physician: Eldonna Elspeth PARAS, MD  Reason for Consultation: Establishing goals of care  HPI/Patient Profile: 88 y.o. 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   Patient has had 4 admissions in the past 6 months, most recently from 7/21 to 7/30 for small bowel obstruction and sepsis.  He missed dialysis last Friday due to his access not working. Also noted to have had outpatient radiology follow-up for increased sacral abscess drain assessment. Noted to have had multiple episodes of free collection with drain dislodgment. Has CT of the abdomen August 15 showing recollection with plan for outpatient drain replacement by interventional radiology.   PMT has been consulted to assist with goals of care conversation.  Clinical Assessment and Goals of Care:  I have reviewed medical records including EPIC notes, labs and imaging, discussed with RN, assessed the patient and then met at the bedside to discuss diagnosis prognosis, GOC, EOL wishes, disposition and options.  I introduced Palliative Medicine as specialized medical care for people living with serious illness. It focuses on providing relief from the symptoms and stress of a serious illness. The goal is to improve quality of life for both the patient and the family.  We discussed a brief life review of the patient and then focused on their current  illness.   I attempted to elicit values and goals of care important to the patient.    Medical History Review and Understanding:  We discussed patient's acute illness in the context of their chronic comorbidities.  Patient understands the severity of his illness.  Social History: Patient reports that he lives home alone and feels safe continuing to live alone.  He has a daughter as well as a son who lives in [place] .  He is widowed.  He previously lived in [place]  for 10 years before moving here after his wife's death.  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  He has never really had any hobbies.  Functional and Nutritional State: Patient reports ambulating with a cane and walker, wanting to improve.  Albumin  of 3.3 today.  Palliative Symptoms: Pain at ileostomy  Code Status: Concepts specific to code status, artifical feeding and hydration, and rehospitalization were considered and discussed.  Patient confirms desire for DNR/DNI.  Discussion: Patient feels his current quality of life is pretty good.  His goal is to improve his ambulating/functional status and there is nothing else that he feels would improve his quality of life.  We discussed his recurrent hospitalizations and recent fall.  He attributes this to a joke on a barstool.  He has never had many hobbies in the past, and now states at 88 years old there is not much for me to do.  He has a good understanding of his current care plan and medical issues.  He is open to continued goals of care discussions and palliative support.   Discussed the importance of continued conversation with family and the medical providers regarding overall plan of care and treatment options,  ensuring decisions are within the context of the patient's values and GOCs.   Questions and concerns were addressed.  The family was encouraged to call with questions or concerns.  PMT will continue to support holistically.  SUMMARY OF  RECOMMENDATIONS   - Continue DNR/DNI - Patient is satisfied with his current quality of life.  His goal is to improve his mobility - Ongoing goals of care discussions - Psychosocial and emotional support provided - PMT will continue to follow and support  Prognosis:  Unable to determine  Discharge Planning: To Be Determined      Primary Diagnoses: Present on Admission:  Dialysis complication  Paroxysmal atrial fibrillation (HCC)  Ulcerative colitis (HCC)  Small bowel obstruction (HCC)  Adrenal insufficiency (HCC)  Asthma    Physical Exam Vitals and nursing note reviewed.  Constitutional:      General: He is not in acute distress.    Appearance: He is ill-appearing.  HENT:     Head: Normocephalic.  Cardiovascular:     Rate and Rhythm: Normal rate.  Pulmonary:     Effort: Pulmonary effort is normal.  Neurological:     Mental Status: He is alert. Mental status is at baseline.  Psychiatric:        Mood and Affect: Mood normal.        Behavior: Behavior normal.     Vital Signs: BP 93/70   Pulse 82   Temp 97.6 F (36.4 C) (Oral)   Resp 19   Ht 5' 8 (1.727 m)   Wt 72.6 kg   SpO2 99%   BMI 24.33 kg/m  Pain Scale: 0-10   Pain Score: 0-No pain   SpO2: SpO2: 99 % O2 Device:SpO2: 99 % O2 Flow Rate: .    Palliative Assessment/Data: 50%    Total time: I spent 80 minutes in the care of the patient today in the above activities and documenting the encounter.    Tyquez Hollibaugh P Lakeva Hollon, PA-C  Palliative Medicine Team Team phone # [phone]  Thank you for allowing the Palliative Medicine Team to assist in the care of this patient. Please utilize secure chat with additional questions, if there is no response within 30 minutes please call the above phone number.  Palliative Medicine Team providers are available by phone from 7am to 7pm daily and can be reached through the team cell phone.  Should this patient require assistance outside of these hours,  please call the patient's attending physician.

## 2024-02-22 NOTE — Progress Notes (Signed)
 PHARMACY - ANTICOAGULATION CONSULT NOTE  Pharmacy Consult for heparin  Indication: atrial fibrillation  Allergies  Allergen Reactions   Baclofen Other (See Comments)    Altered mental status, after accidental overdose     Cephalosporins Rash   Ciprofloxacin Itching and Rash   Wound Dressing Adhesive Rash    Patient Measurements: Height: 5' 8 (172.7 cm) Weight: 72.6 kg (160 lb) IBW/kg (Calculated) : 68.4 HEPARIN  DW (KG): 72.6  Vital Signs: Temp: 97.4 F (36.3 C) (08/18 0753) Temp Source: Oral (08/18 0753) BP: 107/69 (08/18 1000) Pulse Rate: 77 (08/18 1000)  Labs: Recent Labs    02/22/24 0804 02/22/24 0854  HGB 10.1* 11.6*  HCT 34.8* 34.0*  PLT 116*  --   CREATININE 10.32* 11.00*    Estimated Creatinine Clearance: 4.8 mL/min (A) (by C-G formula based on SCr of 11 mg/dL (H)).   Medical History: Past Medical History:  Diagnosis Date   A-fib (HCC)    Anemia    Arthritis    Cancer (HCC)    Basal cell   COVID-19    2021   Dysrhythmia    Afib-controlled on eliquis    ESRD (end stage renal disease) (HCC) 10/22/2021   Glaucoma 11/18/2021   History of DVT (deep vein thrombosis)    Hydronephrosis    managed wtih a PCN   Idiopathic neuropathy 10/22/2021   lyrica     Ileostomy in place [facility])    Obstructive uropathy    With chronic left nephrostomy   Old retinal detachment, total or subtotal    Orthostatic hypotension 10/22/2021   Sleep apnea    does not need a machine   Stroke (HCC)    Ulcerative colitis (HCC)    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 The patient is awaiting IR procedure and transitioning PO to IV anticoagulation. Hgb 11.6, Plt 116, the patient is ESRD on HD. Pharmacy consulted to manage heparin  infusion. Patient previously therapeutic on 1450 units/hr ~ 1 month ago.    Goal of Therapy:  Heparin  level 0.3-0.7 units/ml aPTT  66-102 Monitor platelets by anticoagulation protocol: Yes   Plan:  Start heparin  infusion at 1250 units/hr Check anti-Xa/aPTT level in 8 hours and daily while on heparin  Continue to monitor H&H and platelets  Koren Or, PharmD Clinical Pharmacist 02/22/2024 11:23 AM Please check AMION for all [facility] Pharmacy numbers

## 2024-02-22 NOTE — ED Provider Notes (Signed)
 [place] EMERGENCY DEPARTMENT AT [facility] Provider Note   CSN: [id number] Arrival date & time: 02/22/24  9262     Patient presents with: Vascular Access Problem   Joshua Riley is a 88 y.o. male.   88 yo M with a chief complaints of needing dialysis.  The patient last had dialysis on Thursday.  He typically gets dialysis Monday Wednesday and Friday however had suffered a fall and missed dialysis last Wednesday.  He went on Thursday and then was scheduled to come back on Friday when he went however they felt like his graft was not working.  He was unsure of his instructions afterwards and went back the next day and they told him he needed to come to the emergency department.  He denies any difficulty breathing.    He also is scheduled to have a sacral drain placed tomorrow.  Has had a sacral abscess has been managed by IR drain placement.  Has had some drainage from his rectum which family says typically occurs when he has fluid buildup in his pelvis.            Prior to Admission medications   Medication Sig Start Date End Date Taking? Authorizing Provider  albuterol  (PROVENTIL ) (2.5 MG/3ML) 0.083% nebulizer solution Take 2.5 mg by nebulization every 6 (six) hours as needed for wheezing or shortness of breath.   Yes [provider]  albuterol  (VENTOLIN  HFA) 108 (90 Base) MCG/ACT inhaler Inhale 1-2 puffs into the lungs every 6 (six) hours as needed for wheezing or shortness of breath. Patient taking differently: Inhale 2 puffs into the lungs 2 (two) times daily. 02/11/24  Yes Katrinka Garnette KIDD, MD  Cyanocobalamin (VITAMIN B-12 PO) Take 1 capsule by mouth in the morning and at bedtime.   Yes [provider]  dorzolamide  (TRUSOPT ) 2 % ophthalmic solution Place 1 drop into the right eye at bedtime. 06/17/23  Yes [provider]  Doxylamine Succinate, Sleep, (SLEEP AID PO) Take 1 Dose by mouth at bedtime. Sleep aid of unknown name. OTC.   Yes  [provider]  ELIQUIS  2.5 MG TABS tablet Take 1 tablet (2.5 mg total) by mouth 2 (two) times daily. 02/17/24  Yes Katrinka Garnette KIDD, MD  gabapentin  (NEURONTIN ) 300 MG capsule Take 300 mg by mouth daily as needed (Pain). 09/09/23  Yes [provider]  midodrine  (PROAMATINE ) 10 MG tablet Take 3 tablets (30 mg total) by mouth 3 (three) times daily. 02/03/24  Yes Hunsucker, Donnice SAUNDERS, MD  multivitamin (RENA-VIT) TABS tablet Take 1 tablet by mouth daily.   Yes [provider]  Omega Fatty Acids-Vitamins (OMEGA-3 GUMMIES) CHEW Chew 1 tablet by mouth daily.   Yes [provider]  ramelteon  (ROZEREM ) 8 MG tablet Take 1 tablet (8 mg total) by mouth at bedtime. 10/27/23  Yes Katrinka Garnette KIDD, MD  sevelamer  carbonate (RENVELA ) 800 MG tablet Take 1,600 mg by mouth 3 (three) times daily with meals.   Yes [provider]  timolol  (TIMOPTIC ) 0.5 % ophthalmic solution Place 1 drop into both eyes in the morning and at bedtime.   Yes [provider]  traMADol  (ULTRAM ) 50 MG tablet Take 1 tablet (50 mg total) by mouth every 8 (eight) hours as needed (for pain). 12/03/23  Yes Danford, Lonni SQUIBB, MD  TYLENOL  325 MG tablet Take 325-650 mg by mouth every 6 (six) hours as needed for mild pain (pain score 1-3) or headache.   Yes [provider]  atorvastatin  (  LIPITOR) 20 MG tablet Take 1 tablet (20 mg total) by mouth daily. Patient not taking: No sig reported 11/14/23   Sebastian Toribio GAILS, MD  budesonide  (PULMICORT ) 0.5 MG/2ML nebulizer solution Take 0.5 mg by nebulization 2 (two) times daily as needed (for respiratory flares). Patient not taking: Reported on 02/08/2024    [provider]  calcitRIOL  (ROCALTROL ) 0.5 MCG capsule Take 1 capsule (0.5 mcg total) by mouth every Monday, Wednesday, and Friday with hemodialysis. Patient not taking: Reported on 02/22/2024 12/04/23   Jonel Lonni SQUIBB, MD  fludrocortisone  (FLORINEF ) 0.1 MG tablet Take 1 tablet  (0.1 mg total) by mouth daily. Patient not taking: Reported on 02/22/2024 12/04/23   Jonel Lonni SQUIBB, MD  folic acid  (FOLVITE ) 1 MG tablet Take 1 tablet (1 mg total) by mouth daily. Patient not taking: Reported on 02/22/2024 07/21/23   Regalado, Owen A, MD  hydrocortisone  (CORTEF ) 10 MG tablet Take 2 tablets (20 mg total) by mouth daily and take 1 tablet (10 mg total) by mouth every evening. Patient not taking: Reported on 02/22/2024 02/11/24   Katrinka Garnette KIDD, MD  hydrocortisone  (CORTEF ) 20 MG tablet Take 1 tablet (20 mg total) by mouth daily. Patient not taking: Reported on 02/22/2024 02/11/24   Katrinka Garnette KIDD, MD  mometasone -formoterol  (DULERA ) 200-5 MCG/ACT AERO Inhale 2 puffs into the lungs 2 (two) times daily. Patient not taking: No sig reported 09/08/23   Rosario Leatrice FERNS, MD  pantoprazole  (PROTONIX ) 40 MG tablet Take 1 tablet (40 mg total) by mouth 2 (two) times daily. Patient not taking: Reported on 02/22/2024 11/13/23   Sebastian Toribio GAILS, MD  sertraline  (ZOLOFT ) 50 MG tablet Take 25 mg by mouth at bedtime. Patient not taking: Reported on 02/22/2024    [provider]  Sodium Chloride  Flush (SALINE FLUSH) 0.9 % SOLN Use 5 mLs by Intracatheter route daily as directed. 11/25/23   Caperilla, Marissa N, PA    Allergies: Baclofen, Cephalosporins, Ciprofloxacin, and Wound dressing adhesive    Review of Systems  Updated Vital Signs BP 107/69   Pulse 77   Temp (!) 97.4 F (36.3 C) (Oral)   Resp 18   Ht 5' 8 (1.727 m)   Wt 72.6 kg   SpO2 96%   BMI 24.33 kg/m   Physical Exam Vitals and nursing note reviewed.  Constitutional:      Appearance: He is well-developed.  HENT:     Head: Normocephalic and atraumatic.  Eyes:     Pupils: Pupils are equal, round, and reactive to light.  Neck:     Vascular: No JVD.  Cardiovascular:     Rate and Rhythm: Normal rate and regular rhythm.     Heart sounds: No murmur heard.    No friction rub. No gallop.  Pulmonary:      Effort: No respiratory distress.     Breath sounds: No wheezing.  Abdominal:     General: There is no distension.     Tenderness: There is no abdominal tenderness. There is no guarding or rebound.  Musculoskeletal:        General: Normal range of motion.     Cervical back: Normal range of motion and neck supple.     Comments: Left-sided nephrostomy tube in place.  Urine draining.  No obvious erythema or drainage around the tube site.  Patient has a bandage over a wound to the left lower sacral area.  No obvious drainage or fluctuance.  Left femoral area with some bruising.  No obvious  palpable thrill.  Foot is warm and well-perfused.  Skin:    Coloration: Skin is not pale.     Findings: No rash.  Neurological:     Mental Status: He is alert and oriented to person, place, and time.  Psychiatric:        Behavior: Behavior normal.     (all labs ordered are listed, but only abnormal results are displayed) Labs Reviewed  CBC WITH DIFFERENTIAL/PLATELET - Abnormal; Notable for the following components:      Result Value   RBC 2.73 (*)    Hemoglobin 10.1 (*)    HCT 34.8 (*)    MCV 127.5 (*)    MCH 37.0 (*)    MCHC 29.0 (*)    RDW 19.5 (*)    Platelets 116 (*)    Lymphs Abs 0.3 (*)    All other components within normal limits  COMPREHENSIVE METABOLIC PANEL WITH GFR - Abnormal; Notable for the following components:   CO2 17 (*)    Glucose, Bld 153 (*)    BUN 68 (*)    Creatinine, Ser 10.32 (*)    Calcium  8.7 (*)    Albumin  3.3 (*)    GFR, Estimated 4 (*)    Anion gap 18 (*)    All other components within normal limits  I-STAT CHEM 8, ED - Abnormal; Notable for the following components:   Sodium 133 (*)    BUN 61 (*)    Creatinine, Ser 11.00 (*)    Glucose, Bld 149 (*)    Calcium , Ion 1.00 (*)    TCO2 18 (*)    Hemoglobin 11.6 (*)    HCT 34.0 (*)    All other components within normal limits  HEPARIN  LEVEL (UNFRACTIONATED)  APTT    EKG: None  Radiology: [facility] Chest  Port 1 View Result Date: 02/22/2024 CLINICAL DATA:  Clogged dialysis graft in the left groin. EXAM: PORTABLE CHEST 1 VIEW COMPARISON:  01/25/2024 and CT chest 11/09/2023. FINDINGS: Trachea is midline. Heart is enlarged, stable. Trace interstitial prominence and indistinctness with small bilateral pleural effusions. IMPRESSION: Mild congestive heart failure. Electronically Signed   By: Newell Eke M.D.   On: 02/22/2024 08:22     Procedures   Medications Ordered in the ED  sertraline  (ZOLOFT ) tablet 25 mg (has no administration in time range)  sevelamer  carbonate (RENVELA ) tablet 1,600 mg (1,600 mg Oral Given 02/22/24 1119)  traMADol  (ULTRAM ) tablet 50 mg (has no administration in time range)  pantoprazole  (PROTONIX ) EC tablet 40 mg (40 mg Oral Given 02/22/24 1119)  multivitamin (RENA-VIT) tablet 1 tablet (has no administration in time range)  midodrine  (PROAMATINE ) tablet 30 mg (has no administration in time range)  albuterol  (PROVENTIL ) (2.5 MG/3ML) 0.083% nebulizer solution 2.5 mg (has no administration in time range)  ondansetron  (ZOFRAN ) tablet 4 mg (has no administration in time range)    Or  ondansetron  (ZOFRAN ) injection 4 mg (has no administration in time range)  acetaminophen  (TYLENOL ) tablet 650 mg (has no administration in time range)    Or  acetaminophen  (TYLENOL ) suppository 650 mg (has no administration in time range)  hydrocortisone  sodium succinate  (SOLU-CORTEF ) 100 MG injection 100 mg (100 mg Intravenous Given 02/22/24 1117)  heparin  ADULT infusion 100 units/mL (25000 units/250mL) (has no administration in time range)  Medical Decision Making Amount and/or Complexity of Data Reviewed Labs: ordered. Radiology: ordered.  Risk Decision regarding hospitalization.   88 yo M with a chief complaint of needing dialysis.  The patient gets dialysis Monday Wednesday and Friday.  He was unable to get dialysis last Wednesday but due to a  fall.  He got dialysis on Thursday and then came back for dialysis on Friday but on Friday they were unable to establish access with his left femoral site.  He was unsure of his instructions and so went back on Saturday and they told him he had to come to the hospital.  The patient also has been having trouble with recurrent sacral abscess.  He was supposed to have a catheter placed a couple days ago but was unable, plans to have CT-guided access tomorrow.  Has had some drainage from his rectum which typically happens with fluid buildup per family.  Blood work without hyperkalemia.  Chest x-ray with some edema my independent to rotation.  I discussed case with nephrology, Dr. Susannah.  Recommended discussion with interventional radiology.  I discussed case with Dr. Luverne.  Unfortunately unable to troubleshoot the graft site today.  Recommended medical admission and would evaluate the patient tomorrow.  The patients results and plan were reviewed and discussed.   Any x-rays performed were independently reviewed by myself.   Differential diagnosis were considered with the presenting HPI.  Medications  sertraline  (ZOLOFT ) tablet 25 mg (has no administration in time range)  sevelamer  carbonate (RENVELA ) tablet 1,600 mg (1,600 mg Oral Given 02/22/24 1119)  traMADol  (ULTRAM ) tablet 50 mg (has no administration in time range)  pantoprazole  (PROTONIX ) EC tablet 40 mg (40 mg Oral Given 02/22/24 1119)  multivitamin (RENA-VIT) tablet 1 tablet (has no administration in time range)  midodrine  (PROAMATINE ) tablet 30 mg (has no administration in time range)  albuterol  (PROVENTIL ) (2.5 MG/3ML) 0.083% nebulizer solution 2.5 mg (has no administration in time range)  ondansetron  (ZOFRAN ) tablet 4 mg (has no administration in time range)    Or  ondansetron  (ZOFRAN ) injection 4 mg (has no administration in time range)  acetaminophen  (TYLENOL ) tablet 650 mg (has no administration in time range)    Or   acetaminophen  (TYLENOL ) suppository 650 mg (has no administration in time range)  hydrocortisone  sodium succinate  (SOLU-CORTEF ) 100 MG injection 100 mg (100 mg Intravenous Given 02/22/24 1117)  heparin  ADULT infusion 100 units/mL (25000 units/250mL) (has no administration in time range)    Vitals:   02/22/24 0753 02/22/24 1000  BP: 101/73 107/69  Pulse: 80 77  Resp: 19 18  Temp: (!) 97.4 F (36.3 C)   TempSrc: Oral   SpO2: 100% 96%  Weight: 72.6 kg   Height: 5' 8 (1.727 m)     Final diagnoses:  ESRD (end stage renal disease) (HCC)  Clotted dialysis access, initial encounter [facility])    Admission/ observation were discussed with the admitting physician, patient and/or family and they are comfortable with the plan.       Final diagnoses:  ESRD (end stage renal disease) (HCC)  Clotted dialysis access, initial encounter [facility])    ED Discharge Orders     None          Emil Share, DO 02/22/24 1142

## 2024-02-22 NOTE — Assessment & Plan Note (Addendum)
 Orthostatic Hypotension  Baseline adrenal insufficiency and orthostatic hypotension in setting of ESRD on HD  SBP 90s-100s at the bedside  Will continue home hydrocortisone  and midodrine   Low threshold for stress dose steroids as appropriate

## 2024-02-22 NOTE — ED Notes (Signed)
 Patient last ate around 1130 today. He had some graham crackers and peanut butter.

## 2024-02-22 NOTE — H&P (Addendum)
 History and Physical    Patient: Joshua Riley FMW:[id number] DOB: 19-Jun-1936 DOA: 02/22/2024 DOS: the patient was seen and examined on 02/22/2024 PCP: Katrinka Garnette KIDD, MD  Patient coming from: Home  Chief Complaint:  Chief Complaint  Patient presents with   Vascular Access Problem   HPI: Joshua Riley is a 88 y.o. 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  Patient reports missing hemodialysis last week Friday.  Patient with noted fall and also missed session on Wednesday with follow-up hemodialysis Thursday.  Was unable to obtain dialysis on Wednesday secondary to clogged the left femoral graft.  Has had some mild shortness of breath.  Minimal to mild lower extremity as well as general swelling.  No chest pain.  No abdominal pain or diarrhea.  Noted to have been recently admitted July 21 to July 30 for issues including small bowel obstruction as well as sepsis.  Was conservatively managed including broad-spectrum antibiotics.  Patient denies any episodes of abdominal pain from this point.  Had small pericardial effusion.  Otherwise stable.  Also noted to have had outpatient radiology follow-up for increased sacral abscess drain assessment.  Noted to have had multiple episodes of free collection with drain dislodgment.  Has CT of the abdomen August 15 showing recollection with plan for outpatient drain replacement by interventional radiology. Presented to the ER afebrile, hemodynamically stable.  Blood pressures 90s to 100s over 60s to 70s.  White count 6.2, hemoglobin 10.1, platelets 116, creatinine 11.  Bicarb 17.  Potassium 4.8.  Chest x-ray with vascular congestion.  Review of  Systems: As mentioned in the history of present illness. All other systems reviewed and are negative. Past Medical History:  Diagnosis Date   A-fib (HCC)    Anemia    Arthritis    Cancer (HCC)    Basal cell   COVID-19    2021   Dysrhythmia    Afib-controlled on eliquis    ESRD (end stage renal disease) (HCC) 10/22/2021   Glaucoma 11/18/2021   History of DVT (deep vein thrombosis)    Hydronephrosis    managed wtih a PCN   Idiopathic neuropathy 10/22/2021   lyrica     Ileostomy in place [facility])    Obstructive uropathy    With chronic left nephrostomy   Old retinal detachment, total or subtotal    Orthostatic hypotension 10/22/2021   Sleep apnea    does not need a machine   Stroke (HCC)    Ulcerative colitis (HCC)    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   Past Surgical History:  Procedure Laterality Date   BASAL CELL CARCINOMA EXCISION     10/23   COLON SURGERY     creation of j pouch     and subsequent takedown of j pouch   EYE SURGERY     IR CATHETER TUBE CHANGE  12/18/2023   IR CATHETER TUBE CHANGE  02/19/2024   IR CATHETER TUBE CHANGE  02/19/2024   IR NEPHROSTOMY EXCHANGE LEFT  12/10/2021   IR NEPHROSTOMY EXCHANGE LEFT  04/22/2022   IR NEPHROSTOMY EXCHANGE LEFT  07/29/2022   IR NEPHROSTOMY EXCHANGE LEFT  10/28/2022   IR NEPHROSTOMY EXCHANGE LEFT  02/05/2023   IR NEPHROSTOMY EXCHANGE LEFT  05/07/2023   IR  NEPHROSTOMY EXCHANGE LEFT  06/25/2023   IR NEPHROSTOMY EXCHANGE LEFT  07/17/2023   IR NEPHROSTOMY EXCHANGE LEFT  10/22/2023   IR RADIOLOGIST EVAL & MGMT  11/25/2023   IR RADIOLOGIST EVAL & MGMT  12/18/2023   IR RADIOLOGIST EVAL & MGMT  02/19/2024   LEFT HEART CATH AND CORONARY ANGIOGRAPHY N/A 07/22/2022   Procedure: LEFT HEART CATH AND CORONARY ANGIOGRAPHY;  Surgeon: Swaziland, Peter M, MD;  Location: [facility] INVASIVE CV LAB;  Service: Cardiovascular;  Laterality: N/A;   REVISION OF ARTERIOVENOUS GORETEX GRAFT Left 05/06/2022   Procedure: REDO LEFT THIGH  ARTERIOVENOUS 4-7 MM GORETEX GRAFT;  Surgeon: Eliza Lonni RAMAN, MD;  Location: [facility] OR;  Service: Vascular;  Laterality: Left;   SMALL INTESTINE SURGERY     TOTAL COLECTOMY     Social History:  reports that he quit smoking about 40 years ago. His smoking use included cigarettes. He started smoking about 46 years ago. He has a 12 pack-year smoking history. He has never been exposed to tobacco smoke. He has never used smokeless tobacco. He reports current alcohol use of about 5.0 standard drinks of alcohol per week. He reports that he does not use drugs.  Allergies  Allergen Reactions   Baclofen Other (See Comments)    Altered mental status, after accidental overdose     Cephalosporins Rash   Ciprofloxacin Itching and Rash   Wound Dressing Adhesive Rash    Family History  Problem Relation Age of Onset   Stroke Mother    Cancer Father    Esophageal cancer Brother     Prior to Admission medications   Medication Sig Start Date End Date Taking? Authorizing Provider  albuterol  (PROVENTIL ) (2.5 MG/3ML) 0.083% nebulizer solution Take 2.5 mg by nebulization every 6 (six) hours as needed for wheezing or shortness of breath.   Yes [provider]  albuterol  (VENTOLIN  HFA) 108 (90 Base) MCG/ACT inhaler Inhale 1-2 puffs into the lungs every 6 (six) hours as needed for wheezing or shortness of breath. Patient taking differently: Inhale 2 puffs into the lungs 2 (two) times daily. 02/11/24  Yes Katrinka Garnette KIDD, MD  Cyanocobalamin (VITAMIN B-12 PO) Take 1 capsule by mouth in the morning and at bedtime.   Yes [provider]  dorzolamide  (TRUSOPT ) 2 % ophthalmic solution Place 1 drop into the right eye at bedtime. 06/17/23  Yes [provider]  Doxylamine Succinate, Sleep, (SLEEP AID PO) Take 1 Dose by mouth at bedtime. Sleep aid of unknown name. OTC.   Yes [provider]  ELIQUIS  2.5 MG TABS tablet Take 1 tablet (2.5 mg total) by mouth 2 (two) times daily.  02/17/24  Yes Katrinka Garnette KIDD, MD  gabapentin  (NEURONTIN ) 300 MG capsule Take 300 mg by mouth daily as needed (Pain). 09/09/23  Yes [provider]  midodrine  (PROAMATINE ) 10 MG tablet Take 3 tablets (30 mg total) by mouth 3 (three) times daily. 02/03/24  Yes Hunsucker, Donnice SAUNDERS, MD  multivitamin (RENA-VIT) TABS tablet Take 1 tablet by mouth daily.   Yes [provider]  Omega Fatty Acids-Vitamins (OMEGA-3 GUMMIES) CHEW Chew 1 tablet by mouth daily.   Yes [provider]  ramelteon  (ROZEREM ) 8 MG tablet Take 1 tablet (8 mg total) by mouth at bedtime. 10/27/23  Yes Katrinka Garnette KIDD, MD  sevelamer  carbonate (RENVELA ) 800 MG tablet Take 1,600 mg by mouth 3 (three) times daily with meals.   Yes [provider]  timolol  (TIMOPTIC ) 0.5 % ophthalmic solution Place 1 drop  into both eyes in the morning and at bedtime.   Yes [provider]  traMADol  (ULTRAM ) 50 MG tablet Take 1 tablet (50 mg total) by mouth every 8 (eight) hours as needed (for pain). 12/03/23  Yes Danford, Lonni SQUIBB, MD  TYLENOL  325 MG tablet Take 325-650 mg by mouth every 6 (six) hours as needed for mild pain (pain score 1-3) or headache.   Yes [provider]  atorvastatin  (LIPITOR) 20 MG tablet Take 1 tablet (20 mg total) by mouth daily. Patient not taking: No sig reported 11/14/23   Sebastian Toribio GAILS, MD  budesonide  (PULMICORT ) 0.5 MG/2ML nebulizer solution Take 0.5 mg by nebulization 2 (two) times daily as needed (for respiratory flares). Patient not taking: Reported on 02/08/2024    [provider]  calcitRIOL  (ROCALTROL ) 0.5 MCG capsule Take 1 capsule (0.5 mcg total) by mouth every Monday, Wednesday, and Friday with hemodialysis. Patient not taking: Reported on 02/22/2024 12/04/23   Jonel Lonni SQUIBB, MD  fludrocortisone  (FLORINEF ) 0.1 MG tablet Take 1 tablet (0.1 mg total) by mouth daily. Patient not taking: Reported on 02/22/2024 12/04/23   Jonel Lonni SQUIBB, MD   folic acid  (FOLVITE ) 1 MG tablet Take 1 tablet (1 mg total) by mouth daily. Patient not taking: Reported on 02/22/2024 07/21/23   Regalado, Owen A, MD  hydrocortisone  (CORTEF ) 10 MG tablet Take 2 tablets (20 mg total) by mouth daily and take 1 tablet (10 mg total) by mouth every evening. Patient not taking: Reported on 02/22/2024 02/11/24   Katrinka Garnette KIDD, MD  hydrocortisone  (CORTEF ) 20 MG tablet Take 1 tablet (20 mg total) by mouth daily. Patient not taking: Reported on 02/22/2024 02/11/24   Katrinka Garnette KIDD, MD  mometasone -formoterol  (DULERA ) 200-5 MCG/ACT AERO Inhale 2 puffs into the lungs 2 (two) times daily. Patient not taking: No sig reported 09/08/23   Rosario Eland I, MD  pantoprazole  (PROTONIX ) 40 MG tablet Take 1 tablet (40 mg total) by mouth 2 (two) times daily. Patient not taking: Reported on 02/22/2024 11/13/23   Sebastian Toribio GAILS, MD  sertraline  (ZOLOFT ) 50 MG tablet Take 25 mg by mouth at bedtime. Patient not taking: Reported on 02/22/2024    [provider]  Sodium Chloride  Flush (SALINE FLUSH) 0.9 % SOLN Use 5 mLs by Intracatheter route daily as directed. 11/25/23   Gery Daved SAILOR, PA    Physical Exam: Vitals:   02/22/24 0753 02/22/24 1000  BP: 101/73 107/69  Pulse: 80 77  Resp: 19 18  Temp: (!) 97.4 F (36.3 C)   TempSrc: Oral   SpO2: 100% 96%  Weight: 72.6 kg   Height: 5' 8 (1.727 m)    Physical Exam Constitutional:      Appearance: He is normal weight.  HENT:     Head: Normocephalic and atraumatic.     Nose: Nose normal.     Mouth/Throat:     Mouth: Mucous membranes are moist.  Eyes:     Pupils: Pupils are equal, round, and reactive to light.  Cardiovascular:     Rate and Rhythm: Normal rate and regular rhythm.  Pulmonary:     Effort: Pulmonary effort is normal.  Abdominal:     Tenderness: There is no abdominal tenderness.     Comments: L nephrostomy tube in place  L presacral abscess appears CDI  Ostomy stable   Musculoskeletal:         General: Normal range of motion.  Skin:    General: Skin is warm.  Neurological:  General: No focal deficit present.  Psychiatric:        Mood and Affect: Mood normal.     Data Reviewed:  There are no new results to review at this time.  DG Chest Port 1 View CLINICAL DATA:  Clogged dialysis graft in the left groin.  EXAM: PORTABLE CHEST 1 VIEW  COMPARISON:  01/25/2024 and CT chest 11/09/2023.  FINDINGS: Trachea is midline. Heart is enlarged, stable. Trace interstitial prominence and indistinctness with small bilateral pleural effusions.  IMPRESSION: Mild congestive heart failure.  Electronically Signed   By: Newell Eke M.D.   On: 02/22/2024 08:22  Lab Results  Component Value Date   WBC 6.2 02/22/2024   HGB 11.6 (L) 02/22/2024   HCT 34.0 (L) 02/22/2024   MCV 127.5 (H) 02/22/2024   PLT 116 (L) 02/22/2024   Last metabolic panel Lab Results  Component Value Date   GLUCOSE 149 (H) 02/22/2024   NA 133 (L) 02/22/2024   K 4.5 02/22/2024   CL 105 02/22/2024   CO2 17 (L) 02/22/2024   BUN 61 (H) 02/22/2024   CREATININE 11.00 (H) 02/22/2024   GFRNONAA 4 (L) 02/22/2024   CALCIUM  8.7 (L) 02/22/2024   PHOS 6.6 (H) 02/02/2024   PROT 6.5 02/22/2024   ALBUMIN  3.3 (L) 02/22/2024   BILITOT 0.8 02/22/2024   ALKPHOS 85 02/22/2024   AST 21 02/22/2024   ALT 24 02/22/2024   ANIONGAP 18 (H) 02/22/2024    Assessment and Plan: End-stage renal disease on hemodialysis (HCC) Missed HD  Clogged Vascular Access  Noted baseline history ESRD on hemodialysis Monday Wednesday Friday Missed hemodialysis 8/15 in the setting of clogged left femoral vascular access and missed session 8/13 secondary to fall  In discussion with Rolan Quale in the ER, nephrology with Dr. Vassie has been notified with planned evaluation  Interventional radiology Velvet) will also be assessing clogged left femoral graft  Follow up nephrology and IR recommendations  Does not appear to be in acute  distress or clinical decompensation requiring urgent/emergent HD at present  Monitor      Presacral Abscess Baseline history of presacral abscess being followed by interventional radiolgy  Patient has undergone drain placements 07/2023 and 11/2023 with both drains becoming unintentionally displaced  Recent imaging 12/2023 with fluid reaccumulation s/p drain placement with IR 01/2024  02/2024 imaging with noted reaccumulation-initially planned for outpatient follow   No overt signs of systemic infection at present  Pending IR eval for drain assessment    Adrenal insufficiency (HCC) Orthostatic Hypotension  Baseline adrenal insufficiency and orthostatic hypotension in setting of ESRD on HD  SBP 90s-100s at the bedside  Will continue home hydrocortisone  and midodrine   Low threshold for stress dose steroids as appropriate   Paroxysmal atrial fibrillation (HCC) Baseline history of atrial fibrillation Rate controlled at present We will transition to treatment dose heparin  pending IR interventions Monitor   Ulcerative colitis (HCC) Baseline history of ulcerative colitis s/p colectomy with ileostomy  Appears stable at present  Follow    Asthma Appears stable from a resp standpoint  Cont home inhalers    Small bowel obstruction (HCC) Recent admission 7/21-7/30 for bowel obstruction  Evaluation included PCCM and general surgery in setting of hypotension and adrenal insufficiency  Treated conservatively  Baseline hx/o ulcerative colitis s/p colectomy with ileostomy- confounder  No active abdominal pain  Monitor    Nephrostomy present (HCC) Baseline hx/o obstructive uropathy s/p nephrostomy tube placement  Had L  perc nephrostomy exchange on 8/15  Appears stable         Advance Care Planning:   Code Status: Limited: Do not attempt resuscitation (DNR) -DNR-LIMITED -Do Not Intubate/DNI    Consults:  -IR  -Nephrology Terrell)- Per Rolan Quale DO  -Palliative Care  Family  Communication: No family at the bedside   Severity of Illness: The appropriate patient status for this patient is OBSERVATION. Observation status is judged to be reasonable and necessary in order to provide the required intensity of service to ensure the patient's safety. The patient's presenting symptoms, physical exam findings, and initial radiographic and laboratory data in the context of their medical condition is felt to place them at decreased risk for further clinical deterioration. Furthermore, it is anticipated that the patient will be medically stable for discharge from the hospital within 2 midnights of admission.   Author: Elspeth JINNY Masters, MD 02/22/2024 11:01 AM  For on call review [email or web address].

## 2024-02-22 NOTE — Assessment & Plan Note (Addendum)
 Missed HD  Clogged Vascular Access  Noted baseline history ESRD on hemodialysis Monday Wednesday Friday Missed hemodialysis 8/15 in the setting of clogged left femoral vascular access and missed session 8/13 secondary to fall  In discussion with Rolan Quale in the ER, nephrology with Dr. Vassie has been notified with planned evaluation  Interventional radiology Velvet) will also be assessing clogged left femoral graft  Follow up nephrology and IR recommendations  Does not appear to be in acute distress or clinical decompensation requiring urgent/emergent HD at present  Monitor

## 2024-02-22 NOTE — Assessment & Plan Note (Signed)
 Baseline history of presacral abscess being followed by interventional radiolgy  Patient has undergone drain placements 07/2023 and 11/2023 with both drains becoming unintentionally displaced  Recent imaging 12/2023 with fluid reaccumulation s/p drain placement with IR 01/2024  02/2024 imaging with noted reaccumulation-initially planned for outpatient follow   No overt signs of systemic infection at present  Pending IR eval for drain assessment

## 2024-02-22 NOTE — Assessment & Plan Note (Signed)
 Appears stable from a resp standpoint  Cont home inhalers

## 2024-02-22 NOTE — Plan of Care (Signed)

## 2024-02-22 NOTE — Assessment & Plan Note (Signed)
 Baseline history of atrial fibrillation Rate controlled at present We will transition to treatment dose heparin  pending IR interventions Monitor

## 2024-02-22 NOTE — ED Notes (Signed)
 Pt to floor with NT. Floor notified.

## 2024-02-22 NOTE — Progress Notes (Signed)
 PHARMACY - ANTICOAGULATION CONSULT NOTE  Pharmacy Consult for heparin  Indication: atrial fibrillation  Allergies  Allergen Reactions   Baclofen Other (See Comments)    Altered mental status, after accidental overdose     Cephalosporins Rash   Ciprofloxacin Itching and Rash   Wound Dressing Adhesive Rash    Patient Measurements: Height: 5' 8 (172.7 cm) Weight: 72 kg (158 lb 11.7 oz) IBW/kg (Calculated) : 68.4 HEPARIN  DW (KG): 72  Vital Signs: Temp: 98 F (36.7 C) (08/18 1655) Temp Source: Oral (08/18 1655) BP: 100/82 (08/18 1655) Pulse Rate: 102 (08/18 1655)  Labs: Recent Labs    02/22/24 0804 02/22/24 0854 02/22/24 2009  HGB 10.1* 11.6*  --   HCT 34.8* 34.0*  --   PLT 116*  --   --   APTT  --   --  39*  HEPARINUNFRC  --   --  0.49  CREATININE 10.32* 11.00*  --     Estimated Creatinine Clearance: 4.8 mL/min (A) (by C-G formula based on SCr of 11 mg/dL (H)).   Medical History: Past Medical History:  Diagnosis Date   A-fib (HCC)    Anemia    Arthritis    Cancer (HCC)    Basal cell   COVID-19    2021   Dysrhythmia    Afib-controlled on eliquis    ESRD (end stage renal disease) (HCC) 10/22/2021   Glaucoma 11/18/2021   History of DVT (deep vein thrombosis)    Hydronephrosis    managed wtih a PCN   Idiopathic neuropathy 10/22/2021   lyrica     Ileostomy in place [facility])    Obstructive uropathy    With chronic left nephrostomy   Old retinal detachment, total or subtotal    Orthostatic hypotension 10/22/2021   Sleep apnea    does not need a machine   Stroke (HCC)    Ulcerative colitis (HCC)    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 The patient is awaiting IR procedure and transitioning PO to IV anticoagulation. Hgb 11.6, Plt 116, the patient is ESRD on HD. Pharmacy consulted to manage heparin  infusion. Patient previously therapeutic on 1450 units/hr ~  1 month ago.   PM: aPTT subtherapeutic at 39 sec, not correlating with HL 0.49. Heparin  drip at 1250 units/hr. No issues with the heparin  infusion or bleeding reported.  Goal of Therapy:  Heparin  level 0.3-0.7 units/ml aPTT 66-102 Monitor platelets by anticoagulation protocol: Yes   Plan:  Increase heparin  infusion to 1450 units/hr Check anti-Xa/aPTT level in 8 hours and daily while on heparin  Continue to monitor H&H and platelets  Rocky Slade, PharmD, BCPS Clinical Pharmacist 02/22/2024 8:54 PM  Please check AMION for all [facility] Pharmacy phone numbers After 10:00 PM, call Main Pharmacy [phone]

## 2024-02-22 NOTE — Assessment & Plan Note (Signed)
 Baseline hx/o obstructive uropathy s/p nephrostomy tube placement  Had L  perc nephrostomy exchange on 8/15  Appears stable

## 2024-02-22 NOTE — Assessment & Plan Note (Signed)
 Recent admission 7/21-7/30 for bowel obstruction  Evaluation included PCCM and general surgery in setting of hypotension and adrenal insufficiency  Treated conservatively  Baseline hx/o ulcerative colitis s/p colectomy with ileostomy- confounder  No active abdominal pain  Monitor

## 2024-02-22 NOTE — Assessment & Plan Note (Signed)
 Baseline history of ulcerative colitis s/p colectomy with ileostomy  Appears stable at present  Ostomy consult  Follow

## 2024-02-22 NOTE — ED Triage Notes (Signed)
 Pt c.o clogged dialysis graft in his left groin. Pt usually gets dialysis MWF. Pt had a fall last Wednesday and missed dialysis, so he went Thursday and had a full treatment. Pt went back Friday and they told him his graft was clogged so pt has not had dialysis since Wednesday. Pt also has a left sided nephrostomy tube that was placed in the office on Friday, they attempted to place a right sided tube but was unable to.  Pt denies abd pain or sob.

## 2024-02-22 NOTE — Consult Note (Signed)
 Renal Service Consult Note [facility]  Joshua Riley 02/22/2024 Joshua JONETTA Fret, MD Requesting Physician: Dr. Eldonna, GORMAN.   Reason for Consult: ESRD pt w/ clotted access HPI: The patient is a 88 y.o. year-old w/ PMH as below who presented due to clotted L thigh AV graft.  Pt had normal HD last week on Monday and Thursday (Thursday was off schedule to make up for missed HD on Wed). Then he went on Friday and the access was not working and he did not get HD Friday. He presented to ED this morning. He denied any SOB, abd pain. In ED BP 101/73, HR 80, RR 18, temp 97. Labs showed Na 133, K+ 4.5, BUN 61, creat 11. Hb  11, wbc 6K.  ED consulted IR for access failure and pt was admitted for clotted AVG. IR noted they could put him on for access procedure on Tuesday. Nephrology is asked to consult for esrd.   Pt seen in ED. Pt has not specific c/o's. He goes to all his HD sessions, and follows the fluid restriction so that his UF is typically around 1.5 L. Denies any SOB or extra LE edema.    ROS - denies CP, no joint pain, no HA, no blurry vision, no rash, no diarrhea, no nausea/ vomiting   Past Medical History  Past Medical History:  Diagnosis Date   A-fib (HCC)    Anemia    Arthritis    Cancer (HCC)    Basal cell   COVID-19    2021   Dysrhythmia    Afib-controlled on eliquis    ESRD (end stage renal disease) (HCC) 10/22/2021   Glaucoma 11/18/2021   History of DVT (deep vein thrombosis)    Hydronephrosis    managed wtih a PCN   Idiopathic neuropathy 10/22/2021   lyrica     Ileostomy in place [facility])    Obstructive uropathy    With chronic left nephrostomy   Old retinal detachment, total or subtotal    Orthostatic hypotension 10/22/2021   Sleep apnea    does not need a machine   Stroke (HCC)    Ulcerative colitis (HCC)    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   Past Surgical History  Past Surgical History:  Procedure Laterality Date   BASAL  CELL CARCINOMA EXCISION     10/23   COLON SURGERY     creation of j pouch     and subsequent takedown of j pouch   EYE SURGERY     IR CATHETER TUBE CHANGE  12/18/2023   IR CATHETER TUBE CHANGE  02/19/2024   IR CATHETER TUBE CHANGE  02/19/2024   IR NEPHROSTOMY EXCHANGE LEFT  12/10/2021   IR NEPHROSTOMY EXCHANGE LEFT  04/22/2022   IR NEPHROSTOMY EXCHANGE LEFT  07/29/2022   IR NEPHROSTOMY EXCHANGE LEFT  10/28/2022   IR NEPHROSTOMY EXCHANGE LEFT  02/05/2023   IR NEPHROSTOMY EXCHANGE LEFT  05/07/2023   IR NEPHROSTOMY EXCHANGE LEFT  06/25/2023   IR NEPHROSTOMY EXCHANGE LEFT  07/17/2023   IR NEPHROSTOMY EXCHANGE LEFT  10/22/2023   IR RADIOLOGIST EVAL & MGMT  11/25/2023   IR RADIOLOGIST EVAL & MGMT  12/18/2023   IR RADIOLOGIST EVAL & MGMT  02/19/2024   LEFT HEART CATH AND CORONARY ANGIOGRAPHY N/A 07/22/2022   Procedure: LEFT HEART CATH AND CORONARY ANGIOGRAPHY;  Surgeon: Swaziland, Peter M, MD;  Location: [facility] INVASIVE CV LAB;  Service: Cardiovascular;  Laterality: N/A;   REVISION OF ARTERIOVENOUS GORETEX  GRAFT Left 05/06/2022   Procedure: REDO LEFT THIGH ARTERIOVENOUS 4-7 MM GORETEX GRAFT;  Surgeon: Eliza Lonni RAMAN, MD;  Location: [facility] OR;  Service: Vascular;  Laterality: Left;   SMALL INTESTINE SURGERY     TOTAL COLECTOMY     Family History  Family History  Problem Relation Age of Onset   Stroke Mother    Cancer Father    Esophageal cancer Brother    Social History  reports that he quit smoking about 40 years ago. His smoking use included cigarettes. He started smoking about 46 years ago. He has a 12 pack-year smoking history. He has never been exposed to tobacco smoke. He has never used smokeless tobacco. He reports current alcohol use of about 5.0 standard drinks of alcohol per week. He reports that he does not use drugs. Allergies  Allergies  Allergen Reactions   Baclofen Other (See Comments)    Altered mental status, after accidental overdose     Cephalosporins Rash   Ciprofloxacin  Itching and Rash   Wound Dressing Adhesive Rash   Home medications Prior to Admission medications   Medication Sig Start Date End Date Taking? Authorizing Provider  albuterol  (PROVENTIL ) (2.5 MG/3ML) 0.083% nebulizer solution Take 2.5 mg by nebulization every 6 (six) hours as needed for wheezing or shortness of breath.   Yes [provider]  albuterol  (VENTOLIN  HFA) 108 (90 Base) MCG/ACT inhaler Inhale 1-2 puffs into the lungs every 6 (six) hours as needed for wheezing or shortness of breath. Patient taking differently: Inhale 2 puffs into the lungs 2 (two) times daily. 02/11/24  Yes Katrinka Garnette KIDD, MD  Cyanocobalamin (VITAMIN B-12 PO) Take 1 capsule by mouth in the morning and at bedtime.   Yes [provider]  dorzolamide  (TRUSOPT ) 2 % ophthalmic solution Place 1 drop into the right eye at bedtime. 06/17/23  Yes [provider]  Doxylamine Succinate, Sleep, (SLEEP AID PO) Take 1 Dose by mouth at bedtime. Sleep aid of unknown name. OTC.   Yes [provider]  ELIQUIS  2.5 MG TABS tablet Take 1 tablet (2.5 mg total) by mouth 2 (two) times daily. 02/17/24  Yes Katrinka Garnette KIDD, MD  gabapentin  (NEURONTIN ) 300 MG capsule Take 300 mg by mouth daily as needed (Pain). 09/09/23  Yes [provider]  midodrine  (PROAMATINE ) 10 MG tablet Take 3 tablets (30 mg total) by mouth 3 (three) times daily. 02/03/24  Yes Hunsucker, Donnice SAUNDERS, MD  multivitamin (RENA-VIT) TABS tablet Take 1 tablet by mouth daily.   Yes [provider]  Omega Fatty Acids-Vitamins (OMEGA-3 GUMMIES) CHEW Chew 1 tablet by mouth daily.   Yes [provider]  ramelteon  (ROZEREM ) 8 MG tablet Take 1 tablet (8 mg total) by mouth at bedtime. 10/27/23  Yes Katrinka Garnette KIDD, MD  sevelamer  carbonate (RENVELA ) 800 MG tablet Take 1,600 mg by mouth 3 (three) times daily with meals.   Yes [provider]  timolol  (TIMOPTIC ) 0.5 % ophthalmic solution Place 1 drop into both eyes in the  morning and at bedtime.   Yes [provider]  traMADol  (ULTRAM ) 50 MG tablet Take 1 tablet (50 mg total) by mouth every 8 (eight) hours as needed (for pain). 12/03/23  Yes Danford, Lonni SQUIBB, MD  TYLENOL  325 MG tablet Take 325-650 mg by mouth every 6 (six) hours as needed for mild pain (pain score 1-3) or headache.   Yes [provider]  atorvastatin  (LIPITOR) 20 MG tablet Take 1 tablet (20 mg total) by  mouth daily. Patient not taking: No sig reported 11/14/23   Sebastian Toribio GAILS, MD  budesonide  (PULMICORT ) 0.5 MG/2ML nebulizer solution Take 0.5 mg by nebulization 2 (two) times daily as needed (for respiratory flares). Patient not taking: Reported on 02/08/2024    [provider]  calcitRIOL  (ROCALTROL ) 0.5 MCG capsule Take 1 capsule (0.5 mcg total) by mouth every Monday, Wednesday, and Friday with hemodialysis. Patient not taking: Reported on 02/22/2024 12/04/23   Jonel Lonni SQUIBB, MD  fludrocortisone  (FLORINEF ) 0.1 MG tablet Take 1 tablet (0.1 mg total) by mouth daily. Patient not taking: Reported on 02/22/2024 12/04/23   Jonel Lonni SQUIBB, MD  folic acid  (FOLVITE ) 1 MG tablet Take 1 tablet (1 mg total) by mouth daily. Patient not taking: Reported on 02/22/2024 07/21/23   Regalado, Owen A, MD  hydrocortisone  (CORTEF ) 10 MG tablet Take 2 tablets (20 mg total) by mouth daily and take 1 tablet (10 mg total) by mouth every evening. Patient not taking: Reported on 02/22/2024 02/11/24   Katrinka Garnette KIDD, MD  hydrocortisone  (CORTEF ) 20 MG tablet Take 1 tablet (20 mg total) by mouth daily. Patient not taking: Reported on 02/22/2024 02/11/24   Katrinka Garnette KIDD, MD  mometasone -formoterol  (DULERA ) 200-5 MCG/ACT AERO Inhale 2 puffs into the lungs 2 (two) times daily. Patient not taking: No sig reported 09/08/23   Rosario Leatrice FERNS, MD  pantoprazole  (PROTONIX ) 40 MG tablet Take 1 tablet (40 mg total) by mouth 2 (two) times daily. Patient not taking: Reported on 02/22/2024  11/13/23   Sebastian Toribio GAILS, MD  sertraline  (ZOLOFT ) 50 MG tablet Take 25 mg by mouth at bedtime. Patient not taking: Reported on 02/22/2024    [provider]  Sodium Chloride  Flush (SALINE FLUSH) 0.9 % SOLN Use 5 mLs by Intracatheter route daily as directed. 11/25/23   Caperilla, Daved SAILOR, PA     Vitals:   02/22/24 0753 02/22/24 1000 02/22/24 1202 02/22/24 1309  BP: 101/73 107/69 96/60 93/70   Pulse: 80 77 78 82  Resp: 19 18 17 19   Temp: (!) 97.4 F (36.3 C)   97.6 F (36.4 C)  TempSrc: Oral   Oral  SpO2: 100% 96% 95% 99%  Weight: 72.6 kg     Height: 5' 8 (1.727 m)      Exam Gen alert, no distress, on RA No rash, cyanosis or gangrene Sclera anicteric, throat clear  No jvd or bruits Chest clear bilat to bases, no rales/ wheezing RRR no MRG Abd soft ntnd no mass or ascites +bs GU nl male MS no joint effusions or deformity Ext bilat 1+ pretib LE edema Neuro is alert, Ox 3 , nf    L fem AVG - no bruit  Home bp meds: Midodrine  10mg  tid    OP HD: NW MWF From 01/31/24 --> 2h  B350   71.5kg   2K bath  AVG  Heparin  none    Assessment/ Plan: Clotted L fem AVG: IR consulted for declot AVG +/- other needs on 8/19. Pt is stable and doesn't require urgent HD today.  ESRD: on HD MWF. Had HD last wk on Monday and Thursday, then on Friday AVG was clotted. Next HD tomorrow after access established.  BP: chronic hypotension on midodrine  10 tid, cont.  Volume: chronic mild pretib edema, small gainer, usual UF 1.5L. UF 2 L w/ next HD.  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  L percutaneous nephrostomy tube: f/b IR as outpt Recurrent presacral fluid collection drain: f/b IR outpatient  Adrenal insufficiency: taking hydrocortisone  20mg  am+ 10mg  pm   Myer Fret  MD [facility] 02/22/2024, 1:34 PM  Recent Labs  Lab 02/22/24 0804 02/22/24 0854  HGB 10.1* 11.6*  ALBUMIN  3.3*  --   CALCIUM  8.7*  --   CREATININE 10.32* 11.00*  K 4.8 4.5   Inpatient medications:  hydrocortisone   sod succinate (SOLU-CORTEF ) inj  100 mg Intravenous BID   midodrine   30 mg Oral TID   [START ON 02/23/2024] multivitamin  1 tablet Oral Daily   pantoprazole   40 mg Oral BID   sertraline   25 mg Oral QHS   sevelamer  carbonate  1,600 mg Oral TID WC    heparin  1,250 Units/hr (02/22/24 1212)   acetaminophen  **OR** acetaminophen , albuterol , ondansetron  **OR** ondansetron  (ZOFRAN ) IV, traMADol 

## 2024-02-22 NOTE — Progress Notes (Signed)
 New Admission Note:   Arrival Method: stretcher Mental Orientation: aa+ox4 Telemetry: box 9 Assessment: Completed Skin: see FS IV: SL Pain: denies Tubes: n/a Safety Measures: Safety Fall Prevention Plan has been given, discussed and signed Admission: Completed [facility] Orientation: Patient has been orientated to the room, unit and staff.  Family: not present  Orders have been reviewed and implemented. Will continue to monitor the patient. Call light has been placed within reach and bed alarm has been activated.   Doyal Sias, RN

## 2024-02-22 NOTE — Progress Notes (Signed)
 IR order received for drain placement and evaluation of reportedly clogged graft.  The patient was seen in IR clinic 8/15. Patient had undergone presacral abscess drain placements 07/2023 and 11/2023 with both drains becoming unintentionally displaced. At a clinic visit in 12/18/23 the drain continued to have substantial output, and injection revealed fistulization. The drain was downsized from a 10 fr to an 8 Fr. He was scheduled to return for follow up a few weeks later, but his drain became dislodged during that period and was again replaced. At 8/15 clinic visit, the current drain is just outside collection; plan was made for patient to return OP for drain replacement.   In the meantime, he has also experienced malfunction of L femoral AV graft.   Per nephrology, patient does not need IR to place immediate line to receive HD today.  This patient's case will be reviewed by IR MD who will be on site tomorrow for final approval of cases in AM.   Patient will be placed NPO at MN in anticipation of likely procedure(s) tomorrow.

## 2024-02-22 NOTE — Consult Note (Addendum)
 WOC Nurse ostomy consult note; patient known to WOC team with longstanding ileostomy  Stoma type/location: ileostomy since 2011  Stomal assessment/size: not assessed  Peristomal assessment:  Treatment options for stomal/peristomal skin: 2 barrier ring  Output  Ostomy pouching: 1 piece Soft convex lawson #[id number], barrier ring  Gerlean [phone] Education provided: none, longstanding ostomy.  OK for bedside nurse or patient to change pouch.  Enrolled patient in [employer] DC program: no   If skin denuded,weeping, broken down around stoma crust skin as follows:  Sprinkle stoma powder  Soila #6) over any irritated skin, brush away excess powder Tap lightly over the powder with Cavilon No-Sting barrier wipe (skin barrier wipe-white and blue package) or gloved finger damp with water Allow to dry Apply another layer of powder following the same steps up to three layers.  After dry proceed with routine pouching     Discussed above with bedside nurse. WOC team will not follow this established ostomy. Re-consult if further needs arise.   Thank you,     Powell Bar MSN, RN-BC, [employer]

## 2024-02-23 ENCOUNTER — Inpatient Hospital Stay (HOSPITAL_COMMUNITY)

## 2024-02-23 DIAGNOSIS — Z87891 Personal history of nicotine dependence: Secondary | ICD-10-CM | POA: Diagnosis not present

## 2024-02-23 DIAGNOSIS — J45909 Unspecified asthma, uncomplicated: Secondary | ICD-10-CM | POA: Diagnosis present

## 2024-02-23 DIAGNOSIS — Z936 Other artificial openings of urinary tract status: Secondary | ICD-10-CM | POA: Diagnosis not present

## 2024-02-23 DIAGNOSIS — W19XXXA Unspecified fall, initial encounter: Secondary | ICD-10-CM | POA: Diagnosis present

## 2024-02-23 DIAGNOSIS — Z881 Allergy status to other antibiotic agents status: Secondary | ICD-10-CM | POA: Diagnosis not present

## 2024-02-23 DIAGNOSIS — N186 End stage renal disease: Secondary | ICD-10-CM | POA: Diagnosis present

## 2024-02-23 DIAGNOSIS — N139 Obstructive and reflux uropathy, unspecified: Secondary | ICD-10-CM | POA: Diagnosis present

## 2024-02-23 DIAGNOSIS — T8249XA Other complication of vascular dialysis catheter, initial encounter: Secondary | ICD-10-CM | POA: Diagnosis not present

## 2024-02-23 DIAGNOSIS — I480 Paroxysmal atrial fibrillation: Secondary | ICD-10-CM | POA: Diagnosis present

## 2024-02-23 DIAGNOSIS — D631 Anemia in chronic kidney disease: Secondary | ICD-10-CM | POA: Diagnosis present

## 2024-02-23 DIAGNOSIS — Z91158 Patient's noncompliance with renal dialysis for other reason: Secondary | ICD-10-CM | POA: Diagnosis not present

## 2024-02-23 DIAGNOSIS — T829XXA Unspecified complication of cardiac and vascular prosthetic device, implant and graft, initial encounter: Secondary | ICD-10-CM | POA: Diagnosis not present

## 2024-02-23 DIAGNOSIS — E8889 Other specified metabolic disorders: Secondary | ICD-10-CM | POA: Diagnosis present

## 2024-02-23 DIAGNOSIS — T829XXD Unspecified complication of cardiac and vascular prosthetic device, implant and graft, subsequent encounter: Secondary | ICD-10-CM

## 2024-02-23 DIAGNOSIS — Z932 Ileostomy status: Secondary | ICD-10-CM | POA: Diagnosis not present

## 2024-02-23 DIAGNOSIS — Z91048 Other nonmedicinal substance allergy status: Secondary | ICD-10-CM | POA: Diagnosis not present

## 2024-02-23 DIAGNOSIS — K6819 Other retroperitoneal abscess: Secondary | ICD-10-CM | POA: Diagnosis present

## 2024-02-23 DIAGNOSIS — E875 Hyperkalemia: Secondary | ICD-10-CM | POA: Diagnosis present

## 2024-02-23 DIAGNOSIS — K51919 Ulcerative colitis, unspecified with unspecified complications: Secondary | ICD-10-CM | POA: Diagnosis not present

## 2024-02-23 DIAGNOSIS — Z7189 Other specified counseling: Secondary | ICD-10-CM | POA: Diagnosis not present

## 2024-02-23 DIAGNOSIS — Z515 Encounter for palliative care: Secondary | ICD-10-CM | POA: Diagnosis not present

## 2024-02-23 DIAGNOSIS — Z85828 Personal history of other malignant neoplasm of skin: Secondary | ICD-10-CM | POA: Diagnosis not present

## 2024-02-23 DIAGNOSIS — Z86718 Personal history of other venous thrombosis and embolism: Secondary | ICD-10-CM | POA: Diagnosis not present

## 2024-02-23 DIAGNOSIS — Z66 Do not resuscitate: Secondary | ICD-10-CM | POA: Diagnosis present

## 2024-02-23 DIAGNOSIS — Z7901 Long term (current) use of anticoagulants: Secondary | ICD-10-CM | POA: Diagnosis not present

## 2024-02-23 DIAGNOSIS — T82868A Thrombosis of vascular prosthetic devices, implants and grafts, initial encounter: Secondary | ICD-10-CM | POA: Diagnosis present

## 2024-02-23 DIAGNOSIS — K5190 Ulcerative colitis, unspecified, without complications: Secondary | ICD-10-CM | POA: Diagnosis present

## 2024-02-23 DIAGNOSIS — E2740 Unspecified adrenocortical insufficiency: Secondary | ICD-10-CM | POA: Diagnosis present

## 2024-02-23 DIAGNOSIS — Z992 Dependence on renal dialysis: Secondary | ICD-10-CM | POA: Diagnosis not present

## 2024-02-23 DIAGNOSIS — I951 Orthostatic hypotension: Secondary | ICD-10-CM | POA: Diagnosis present

## 2024-02-23 DIAGNOSIS — Y832 Surgical operation with anastomosis, bypass or graft as the cause of abnormal reaction of the patient, or of later complication, without mention of misadventure at the time of the procedure: Secondary | ICD-10-CM | POA: Diagnosis present

## 2024-02-23 LAB — COMPREHENSIVE METABOLIC PANEL WITH GFR
ALT: 21 U/L (ref 0–44)
AST: 14 U/L — ABNORMAL LOW (ref 15–41)
Albumin: 3.4 g/dL — ABNORMAL LOW (ref 3.5–5.0)
Alkaline Phosphatase: 86 U/L (ref 38–126)
Anion gap: 12 (ref 5–15)
BUN: 77 mg/dL — ABNORMAL HIGH (ref 8–23)
CO2: 21 mmol/L — ABNORMAL LOW (ref 22–32)
Calcium: 8.7 mg/dL — ABNORMAL LOW (ref 8.9–10.3)
Chloride: 102 mmol/L (ref 98–111)
Creatinine, Ser: 11.67 mg/dL — ABNORMAL HIGH (ref 0.61–1.24)
GFR, Estimated: 4 mL/min — ABNORMAL LOW (ref >60)
Glucose, Bld: 125 mg/dL — ABNORMAL HIGH (ref 70–99)
Potassium: 6 mmol/L — ABNORMAL HIGH (ref 3.5–5.1)
Sodium: 135 mmol/L (ref 135–145)
Total Bilirubin: 0.9 mg/dL (ref 0.0–1.2)
Total Protein: 6.3 g/dL — ABNORMAL LOW (ref 6.5–8.1)

## 2024-02-23 LAB — APTT: aPTT: 124 s — ABNORMAL HIGH (ref 24–36)

## 2024-02-23 LAB — CBC
HCT: 33.1 % — ABNORMAL LOW (ref 39.0–52.0)
Hemoglobin: 10.2 g/dL — ABNORMAL LOW (ref 13.0–17.0)
MCH: 36.8 pg — ABNORMAL HIGH (ref 26.0–34.0)
MCHC: 30.8 g/dL (ref 30.0–36.0)
MCV: 119.5 fL — ABNORMAL HIGH (ref 80.0–100.0)
Platelets: 124 K/uL — ABNORMAL LOW (ref 150–400)
RBC: 2.77 MIL/uL — ABNORMAL LOW (ref 4.22–5.81)
RDW: 18.7 % — ABNORMAL HIGH (ref 11.5–15.5)
WBC: 4.1 K/uL (ref 4.0–10.5)
nRBC: 0 % (ref 0.0–0.2)

## 2024-02-23 LAB — PROTIME-INR
INR: 1.2 (ref 0.8–1.2)
Prothrombin Time: 15.6 s — ABNORMAL HIGH (ref 11.4–15.2)

## 2024-02-23 LAB — HEPATITIS B SURFACE ANTIGEN: Hepatitis B Surface Ag: NONREACTIVE

## 2024-02-23 LAB — HEPARIN LEVEL (UNFRACTIONATED): Heparin Unfractionated: 0.79 [IU]/mL — ABNORMAL HIGH (ref 0.30–0.70)

## 2024-02-23 MED ORDER — FENTANYL CITRATE (PF) 100 MCG/2ML IJ SOLN
INTRAMUSCULAR | Status: AC | PRN
  Administered 2024-02-23: 25 ug via INTRAVENOUS

## 2024-02-23 MED ORDER — LIDOCAINE HCL 1 % IJ SOLN
20.0000 mL | Freq: Once | INTRAMUSCULAR | Status: AC
  Administered 2024-02-23: 10 mL
  Filled 2024-02-23: qty 20

## 2024-02-23 MED ORDER — CHLORHEXIDINE GLUCONATE CLOTH 2 % EX PADS
6.0000 | MEDICATED_PAD | Freq: Every day | CUTANEOUS | Status: DC
  Administered 2024-02-23 – 2024-02-24 (×2): 6 via TOPICAL

## 2024-02-23 MED ORDER — ALTEPLASE 2 MG IJ SOLR
INTRAMUSCULAR | Status: AC | PRN
  Administered 2024-02-23: 4 mg

## 2024-02-23 MED ORDER — LIDOCAINE-EPINEPHRINE 1 %-1:100000 IJ SOLN
INTRAMUSCULAR | Status: AC
  Filled 2024-02-23: qty 1

## 2024-02-23 MED ORDER — IOHEXOL 300 MG/ML  SOLN
100.0000 mL | Freq: Once | INTRAMUSCULAR | Status: AC | PRN
  Administered 2024-02-23: 60 mL via INTRAVENOUS

## 2024-02-23 MED ORDER — FENTANYL CITRATE (PF) 100 MCG/2ML IJ SOLN
INTRAMUSCULAR | Status: AC
Start: 2024-02-23 — End: 2024-02-23
  Filled 2024-02-23: qty 2

## 2024-02-23 MED ORDER — HEPARIN SODIUM (PORCINE) 1000 UNIT/ML IJ SOLN
INTRAMUSCULAR | Status: AC | PRN
  Administered 2024-02-23: 5000 [IU] via INTRAVENOUS

## 2024-02-23 MED ORDER — MIDAZOLAM HCL 2 MG/2ML IJ SOLN
INTRAMUSCULAR | Status: AC
  Filled 2024-02-23: qty 2

## 2024-02-23 MED ORDER — ALTEPLASE 2 MG IJ SOLR
INTRAMUSCULAR | Status: AC
  Filled 2024-02-23: qty 4

## 2024-02-23 MED ORDER — MIDAZOLAM HCL 2 MG/2ML IJ SOLN
INTRAMUSCULAR | Status: AC | PRN
  Administered 2024-02-23 (×2): 1 mg via INTRAVENOUS

## 2024-02-23 MED ORDER — HEPARIN SODIUM (PORCINE) 1000 UNIT/ML IJ SOLN
INTRAMUSCULAR | Status: AC
  Filled 2024-02-23: qty 10

## 2024-02-23 NOTE — Progress Notes (Signed)
 PHARMACY - ANTICOAGULATION CONSULT NOTE  Pharmacy Consult for heparin  Indication: atrial fibrillation  Allergies  Allergen Reactions   Baclofen Other (See Comments)    Altered mental status, after accidental overdose     Cephalosporins Rash   Ciprofloxacin Itching and Rash   Wound Dressing Adhesive Rash    Patient Measurements: Height: 5' 8 (172.7 cm) Weight: 72 kg (158 lb 11.7 oz) IBW/kg (Calculated) : 68.4 HEPARIN  DW (KG): 72  Vital Signs: Temp: 98 F (36.7 C) (08/19 0443) BP: 100/65 (08/19 0737) Pulse Rate: 56 (08/19 0737)  Labs: Recent Labs    02/22/24 0804 02/22/24 0854 02/22/24 2009 02/23/24 0526  HGB 10.1* 11.6*  --  10.2*  HCT 34.8* 34.0*  --  33.1*  PLT 116*  --   --  124*  APTT  --   --  39* 124*  LABPROT  --   --   --  15.6*  INR  --   --   --  1.2  HEPARINUNFRC  --   --  0.49 0.79*  CREATININE 10.32* 11.00*  --  11.67*    Estimated Creatinine Clearance: 4.5 mL/min (A) (by C-G formula based on SCr of 11.67 mg/dL (H)).   Medical History: Past Medical History:  Diagnosis Date   A-fib (HCC)    Anemia    Arthritis    Cancer (HCC)    Basal cell   COVID-19    2021   Dysrhythmia    Afib-controlled on eliquis    ESRD (end stage renal disease) (HCC) 10/22/2021   Glaucoma 11/18/2021   History of DVT (deep vein thrombosis)    Hydronephrosis    managed wtih a PCN   Idiopathic neuropathy 10/22/2021   lyrica     Ileostomy in place [facility]    Obstructive uropathy    With chronic left nephrostomy   Old retinal detachment, total or subtotal    Orthostatic hypotension 10/22/2021   Sleep apnea    does not need a machine   Stroke (HCC)    Ulcerative colitis (HCC)    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 The patient is awaiting IR procedure and transitioning PO to IV anticoagulation. Hgb 11.6, Plt 116, the patient is ESRD on HD. Pharmacy consulted  to manage heparin  infusion. Patient previously therapeutic on 1450 units/hr ~ 1 month ago.   PM: aPTT subtherapeutic at 39 sec, not correlating with HL 0.49. Heparin  drip at 1250 units/hr. No issues with the heparin  infusion or bleeding reported.  8/19 AM update: aPTT 124 seconds HL 0.79 No signs of bleeding or issues noted with gtt   Goal of Therapy:  Heparin  level 0.3-0.7 units/ml aPTT 66-102 Monitor platelets by anticoagulation protocol: Yes   Plan:  Decrease heparin  infusion to 1300 units/hr Scheduled for thrombectomy at 1400 Follow-up restart post procedure Continue to monitor H&H and platelets    Hailly Fess BS, PharmD, BCPS Clinical Pharmacist 02/23/2024 10:24 AM  Contact: [phone] after 3 PM

## 2024-02-23 NOTE — Progress Notes (Signed)
 Pt receives out-pt HD at [facility] [place] on MWF 6:10 am chair time. Will assist as needed.   Randine Mungo Dialysis Navigator [phone]

## 2024-02-23 NOTE — Evaluation (Signed)
 Occupational Therapy Evaluation Patient Details Name: Joshua Riley MRN: [id number] DOB: 1936/05/30 Today's Date: 02/23/2024   History of Present Illness   88 y.o. male  presents for missed dialysis due to vascular access clogged and recurrent presacral abscess with drainage replaced on 02/19/24 PMH - ESRD on HD, afib, CVA, depression, colectomy, ileostomy, chronic hypotension, DVT, acute pericarditis, groin abscess with lt transgluteal drain, nephrostomy tubes, chf, copd     Clinical Impressions Pt c/o fatigue, otherwise resting comfortably in bed. Pt lives alone, has aide from 12-4 weekly to assist with laundry, bathing, cleaning, groceries. PLOF mod I with RW/cane as needed. Pt currently close to baseline, feels ready to go home once medically ready. Pt able to complete ADLs mod I, supervision for ambulation with RW for safety, very mild swaying first couple of steps with RW but ambulated 200 feet with fast gait, no other balance deficits noted. Ind with bed mobility. No further acute OT or follow up needed.      If plan is discharge home, recommend the following:   Assist for transportation;Assistance with cooking/housework     Functional Status Assessment   Patient has had a recent decline in their functional status and demonstrates the ability to make significant improvements in function in a reasonable and predictable amount of time.     Equipment Recommendations   None recommended by OT     Recommendations for Other Services         Precautions/Restrictions   Precautions Precautions: Fall Recall of Precautions/Restrictions: Intact Restrictions Weight Bearing Restrictions Per Provider Order: No     Mobility Bed Mobility Overal bed mobility: Modified Independent                  Transfers Overall transfer level: Needs assistance Equipment used: Rolling walker (2 wheels) Transfers: Sit to/from Stand Sit to Stand: Supervision            General transfer comment: supervision for safety, no physical assist      Balance Overall balance assessment: Mild deficits observed, not formally tested                                         ADL either performed or assessed with clinical judgement   ADL Overall ADL's : Modified independent                                             Vision Baseline Vision/History: 0 No visual deficits Ability to See in Adequate Light: 0 Adequate Patient Visual Report: No change from baseline       Perception         Praxis         Pertinent Vitals/Pain Pain Assessment Pain Assessment: No/denies pain     Extremity/Trunk Assessment Upper Extremity Assessment Upper Extremity Assessment: Overall WFL for tasks assessed           Communication Communication Communication: Impaired Factors Affecting Communication: Hearing impaired   Cognition Arousal: Alert Behavior During Therapy: WFL for tasks assessed/performed Cognition: No apparent impairments                               Following commands: Intact       Cueing  General Comments   Cueing Techniques: Verbal cues      Exercises     Shoulder Instructions      Home Living Family/patient expects to be discharged to:: Private residence Living Arrangements: Alone Available Help at Discharge: Family;Available PRN/intermittently Type of Home: Apartment Home Access: Level entry     Home Layout: One level     Bathroom Shower/Tub: Producer, television/film/video: Standard Bathroom Accessibility: Yes   Home Equipment: Wheelchair - Forensic psychologist (2 wheels);Rollator (4 wheels);Shower seat;Hand held shower head   Additional Comments: Pt has family 20 mins away      Prior Functioning/Environment Prior Level of Function : Independent/Modified Independent;Driving             Mobility Comments: uses RW/cane ADLs Comments: has aide 12-4 to help with  groceries, laundry, bathing    OT Problem List: Decreased strength;Decreased activity tolerance   OT Treatment/Interventions:        OT Goals(Current goals can be found in the care plan section)   Acute Rehab OT Goals Patient Stated Goal: to return home OT Goal Formulation: With patient Time For Goal Achievement: 03/08/24 Potential to Achieve Goals: Good   OT Frequency:       Co-evaluation              AM-PAC OT 6 Clicks Daily Activity     Outcome Measure Help from another person eating meals?: None Help from another person taking care of personal grooming?: None Help from another person toileting, which includes using toliet, bedpan, or urinal?: None Help from another person bathing (including washing, rinsing, drying)?: None Help from another person to put on and taking off regular upper body clothing?: None Help from another person to put on and taking off regular lower body clothing?: None 6 Click Score: 24   End of Session Equipment Utilized During Treatment: Gait belt;Rolling walker (2 wheels) Nurse Communication: Mobility status  Activity Tolerance: Patient tolerated treatment well Patient left: in bed;with call bell/phone within reach  OT Visit Diagnosis: Muscle weakness (generalized) (M62.81)                Time: 8857-8799 OT Time Calculation (min): 18 min Charges:  OT General Charges $OT Visit: 1 Visit OT Evaluation $OT Eval Low Complexity: 1 Low  [address], OTR/L   Elouise JONELLE Bott 02/23/2024, 12:08 PM

## 2024-02-23 NOTE — Plan of Care (Signed)
  Problem: Education: Goal: Knowledge of disease and its progression will improve Outcome: Progressing   Problem: Fluid Volume: Goal: Compliance with measures to maintain balanced fluid volume will improve Outcome: Progressing   

## 2024-02-23 NOTE — Progress Notes (Signed)
 PT Cancellation Note  Patient Details Name: Joshua Riley MRN: [id number] DOB: [birth year]   Cancelled Treatment:    Reason Eval/Treat Not Completed: PT screened, no needs identified, will sign off  Noting close to baseline per OT;   Silvano Currier, PT  Acute Rehabilitation Services Office [phone] Secure Chat welcomed    Silvano VEAR Currier 02/23/2024, 1:22 PM

## 2024-02-23 NOTE — Progress Notes (Signed)
 Daily Progress Note   Patient Name: Joshua Riley       Date: 02/23/2024 DOB: June 24, 1936  Age: 88 y.o. MRN#: [id number] Attending Physician: Odell Celinda Balo, MD Primary Care Physician: Katrinka Garnette KIDD, MD Admit Date: 02/22/2024  Reason for Consultation/Follow-up: Establishing goals of care  Subjective: Medical records reviewed including progress notes, labs and imaging. Patient assessed at the bedside. He is sitting up on the edge of his bed hunched over. Reports sleeping poorly last night,which is common when he is in the hospital. No family was present during my visit.  Created space and opportunity for patient's thoughts and feelings on his current illness. He shares his expectation for upcoming IR procedures and that he would prefer to get both done together so he can leave the hospital sooner.   I explored whether any previous conversations have been held about his prognosis and long-term expectations, but he tells me he has never had anyone discuss this with him. He is interested in learning more about when his presacral abscess might resolve and what to expect with dialysis access moving forward, whether he might have another clotting issue again in the future etc.    We discussed that he might need hospice one day if he is unable to continue with dialysis and he was quite surprised to hear this.  He states so I would just die.  He is clearly not ready for that.  Emotional support and therapeutic listening was provided.  I shared that I would advocate for his team to provide him with all the information he would like to make appropriate medical decisions for himself moving forward.  Questions and concerns addressed. PMT will continue to support holistically.   Length of Stay: 0   Physical Exam Vitals and nursing note  reviewed.  Constitutional:      General: He is not in acute distress.    Appearance: He is ill-appearing.  HENT:     Head: Normocephalic and atraumatic.  Cardiovascular:     Rate and Rhythm: Normal rate.  Pulmonary:     Effort: Pulmonary effort is normal.  Neurological:     Mental Status: He is alert and oriented to person, place, and time. Mental status is at baseline.  Psychiatric:        Mood and Affect: Mood normal.        Behavior: Behavior normal.             Vital Signs: BP 100/65 (BP Location: Left Arm)   Pulse (!) 56   Temp 98 F (36.7 C)   Resp 18   Ht 5'  8 (1.727 m)   Wt 72 kg   SpO2 99%   BMI 24.14 kg/m  SpO2: SpO2: 99 % O2 Device: O2 Device: Room Air O2 Flow Rate:        Palliative Care Assessment & Plan   Patient Profile: 88 y.o. 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    Patient has had 4 admissions in the past 6 months, most recently from 7/21 to 7/30 for small bowel obstruction and sepsis.  He missed dialysis last Friday due to his access not working. Also noted to have had outpatient radiology follow-up for increased sacral abscess drain assessment. Noted to have had multiple episodes of free collection with drain dislodgment. Has CT of the abdomen August 15 showing recollection with plan for outpatient drain replacement by interventional radiology.    PMT has been consulted to assist with goals of care conversation.  Assessment: Goals of care conversation Clotted dialysis access End-stage renal disease on dialysis Presacral  abscess Ulcerative colitis status post TAC ileostomy obstructive uropathy s/p L percuteaneous nephrostomy tube   Recommendations/Plan: Continue DNR/DNI Goals of care are clear to improve mobility and continue with life-prolonging interventions such as dialysis.  He would like more clear communication regarding what to expect moving forward with his vascular access and presacral abscess Psychosocial and emotional support provided PMT will continue to follow and support as needed    Prognosis:  Unable to determine   Discharge Planning: To Be Determined  Care plan was discussed with patient   Total time: I spent 35 minutes in the care of the patient today in the above activities and documenting the encounter.          Lex Linhares P Josephene Marrone, PA-C  Palliative Medicine Team Team phone # [phone]  Thank you for allowing the Palliative Medicine Team to assist in the care of this patient. Please utilize secure chat with additional questions, if there is no response within 30 minutes please call the above phone number.  Palliative Medicine Team providers are available by phone from 7am to 7pm daily and can be reached through the team cell phone.  Should this patient require assistance outside of these hours, please call the patient's attending physician.

## 2024-02-23 NOTE — Progress Notes (Signed)
 Off the floor for Dialysis

## 2024-02-23 NOTE — Progress Notes (Signed)
 PHARMACY - ANTICOAGULATION CONSULT NOTE  Pharmacy Consult for heparin  Indication: atrial fibrillation  Allergies  Allergen Reactions   Baclofen Other (See Comments)    Altered mental status, after accidental overdose     Cephalosporins Rash   Ciprofloxacin Itching and Rash   Wound Dressing Adhesive Rash    Patient Measurements: Height: 5' 8 (172.7 cm) Weight: 72 kg (158 lb 11.7 oz) IBW/kg (Calculated) : 68.4 HEPARIN  DW (KG): 72  Vital Signs: Temp: 98.6 F (37 C) (08/19 1352) Temp Source: Oral (08/19 1352) BP: 92/65 (08/19 1759) Pulse Rate: 63 (08/19 1759)  Labs: Recent Labs    02/22/24 0804 02/22/24 0854 02/22/24 2009 02/23/24 0526  HGB 10.1* 11.6*  --  10.2*  HCT 34.8* 34.0*  --  33.1*  PLT 116*  --   --  124*  APTT  --   --  39* 124*  LABPROT  --   --   --  15.6*  INR  --   --   --  1.2  HEPARINUNFRC  --   --  0.49 0.79*  CREATININE 10.32* 11.00*  --  11.67*    Estimated Creatinine Clearance: 4.5 mL/min (A) (by C-G formula based on SCr of 11.67 mg/dL (H)).   Medical History: Past Medical History:  Diagnosis Date   A-fib (HCC)    Anemia    Arthritis    Cancer (HCC)    Basal cell   COVID-19    2021   Dysrhythmia    Afib-controlled on eliquis    ESRD (end stage renal disease) (HCC) 10/22/2021   Glaucoma 11/18/2021   History of DVT (deep vein thrombosis)    Hydronephrosis    managed wtih a PCN   Idiopathic neuropathy 10/22/2021   lyrica     Ileostomy in place [facility])    Obstructive uropathy    With chronic left nephrostomy   Old retinal detachment, total or subtotal    Orthostatic hypotension 10/22/2021   Sleep apnea    does not need a machine   Stroke (HCC)    Ulcerative colitis (HCC)    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 The patient is awaiting IR procedure and transitioning PO to IV anticoagulation. Hgb 11.6, Plt 116, the patient is  ESRD on HD. Pharmacy consulted to manage heparin  infusion. Patient previously therapeutic on 1450 units/hr ~ 1 month ago.   8/19 AM update: aPTT 124 seconds HL 0.79 No signs of bleeding or issues noted with gtt  PM: s/p successful declot of left thigh AV graft. Ok per IR to resume heparin  drip after (done ~18:00)   Goal of Therapy:  Heparin  level 0.3-0.7 units/ml aPTT 66-102 Monitor platelets by anticoagulation protocol: Yes   Plan:  Resume heparin  infusion at 1300 units/hr (done ~18:00) Check aPTT and HL in 8hrs Continue to monitor H&H and platelets F/u for transition back to oral anticoagulant  Rocky Slade, PharmD, BCPS Clinical Pharmacist 02/23/2024 6:57 PM  Contact: [phone] after 3 PM

## 2024-02-23 NOTE — Progress Notes (Signed)
 [facility] PROGRESS NOTE    Progress Note  Joshua Riley  FMW:[id number] DOB: 01-15-1936 DOA: 02/22/2024 PCP: Katrinka Garnette KIDD, MD     Brief Narrative:   Joshua Riley is an 88 y.o. male past medical history significant for end-stage renal disease on hemodialysis, chronic atrial fibrillation on Eliquis , ulcerative colitis, history of obstructive uropathy status post left percutaneous nephrostomy tube, chronic presacral abscess with drainage last replaced on 02/19/2024 in clinic in place presents for missed dialysis due to vascular access clogged and recurrent presacral abscess   Assessment/Plan:   End-stage renal disease on hemodialysis with missed dialysis due to 2 clotted vascular access/  Dialysis complication: Usually dialyzes Monday Wednesday Friday. Missed dialysis on 02/19/2024 and on 02/17/2024 secondary to a fall. Nephrology has been notified. Interventional radiology has been consulted for accessing  left femoral graft hopefully on 02/23/2024. No urgent dialysis needed at this point in time.  Presacral drainag the area of the left presacral region with drain placement on 01/07/2024 At some point in time this drain came out. Drain placed in [place] 2025 then dislodge accidentally , in clinic on 12/18/2023 continues to have substantial output drainage injection revealed fistulization.   The drain was downsized from a 10 to an 8.  CT scan in June 2025 showed residual accumulation of fluid imaging on 01/07/2024 showed re-accumulation, 8 French drain placed into fluid collection.  Drain came out. IR to evaluate drain. He has remained afebrile with no leukocytosis. Question if he has a fistula as this continues to drain. Continue to hold antibiotics.  Adrenal insufficiency: Blood pressures relatively stable continue current dose of steroids and midodrine .  Paroxysmal atrial fibrillation: Rate controlled, holding Eliquis  now on IV heparin  as we anticipate procedure on  02/23/2024.  Ulcerative colitis with a history of abdominal ileostomy with a colostomy: Status post colectomy with ileostomy. Appears to be stable.  Asthma: Appears to be stable.  History of small bowel obstruction: With a recent discharge from the hospital on 02/03/2024. Was treated conservatively.  History of obstructive uropathy: With nephrostomy tube in place. Had a left percutaneous nephrostomy tube exchange on 02/19/2024.   DVT prophylaxis: heparin  Family Communication:none Status is: Observation The patient will require care spanning > 2 midnights and should be moved to inpatient because: Dialysis complication    Code Status:     Code Status Orders  (From admission, onward)           Start     Ordered   02/22/24 1045  Do not attempt resuscitation (DNR)- Limited -Do Not Intubate (DNI)  Continuous       Question Answer Comment  If pulseless and not breathing No CPR or chest compressions.   In Pre-Arrest Conditions (Patient Is Breathing and Has A Pulse) Do not intubate. Provide all appropriate non-invasive medical interventions. Avoid ICU transfer unless indicated or required.   Consent: Discussion documented in EHR or advanced directives reviewed      02/22/24 1045           Code Status History     Date Active Date Inactive Code Status Order ID Comments User Context   01/26/2024 0750 02/03/2024 1921 Full Code [id number]  Georgina Basket, MD Inpatient   01/07/2024 1111 01/08/2024 0522 Full Code [id number]  [name] Cordella LABOR, MD HOV   12/30/2023 1710 12/31/2023 0518 Full Code [id number]  [name] Cordella LABOR, MD HOV   11/30/2023 1612 12/03/2023 2056 Limited: Do not attempt resuscitation (DNR) -DNR-LIMITED -Do Not Intubate/DNI  [id number]  Briana Shown  A, MD Inpatient   11/28/2023 0409 11/30/2023 1612 Full Code [id number]  Alfornia Madison, MD ED   11/09/2023 1611 11/13/2023 2306 Full Code [id number]  Tobie Mario GAILS, MD Inpatient   09/07/2023 0218 09/09/2023 0159 Full Code [id number]   Franky Redia SAILOR, MD Inpatient   07/07/2023 2115 07/21/2023 1542 Full Code [id number]  Marcene Eva NOVAK, DO ED   07/22/2022 0822 07/22/2022 1514 Full Code [id number]  [place], Peter M, MD Inpatient   06/02/2022 1520 06/07/2022 1916 Full Code [id number]  Claudene Maximino LABOR, MD ED   05/06/2022 1512 05/07/2022 1431 Full Code [id number]  Charlyne Teretha RIGGERS Inpatient   11/18/2021 1130 11/19/2021 2030 Full Code [id number]  Barbarann Nest, MD Inpatient      Advance Directive Documentation    Flowsheet Row Most Recent Value  Type of Advance Directive Out of facility DNR (pink MOST or yellow form)  Pre-existing out of facility DNR order (yellow form or pink MOST form) Physician notified to receive inpatient order  MOST Form in Place? --      IV Access:   Peripheral IV   Procedures and diagnostic studies:   DG Chest Port 1 View Result Date: 02/22/2024 CLINICAL DATA:  Clogged dialysis graft in the left groin. EXAM: PORTABLE CHEST 1 VIEW COMPARISON:  01/25/2024 and CT chest 11/09/2023. FINDINGS: Trachea is midline. Heart is enlarged, stable. Trace interstitial prominence and indistinctness with small bilateral pleural effusions. IMPRESSION: Mild congestive heart failure. Electronically Signed   By: Newell Eke M.D.   On: 02/22/2024 08:22     Medical Consultants:   None.   Subjective:    Joshua Riley no complaints.  Objective:    Vitals:   02/22/24 1359 02/22/24 1655 02/22/24 2110 02/23/24 0443  BP: (!) 107/93 100/82 90/70 99/77   Pulse: 78 (!) 102 66 (!) 56  Resp: 18 18 18 18   Temp: 98 F (36.7 C) 98 F (36.7 C) 98.7 F (37.1 C) 98 F (36.7 C)  TempSrc: Oral Oral    SpO2: 99% 99% 100% 99%  Weight:      Height:       SpO2: 99 %   Intake/Output Summary (Last 24 hours) at 02/23/2024 0610 Last data filed at 02/23/2024 0356 Gross per 24 hour  Intake 181.3 ml  Output 200 ml  Net -18.7 ml   Filed Weights   02/22/24 0753 02/22/24 1349  Weight: 72.6 kg 72 kg     Exam: General exam: In no acute distress. Respiratory system: Good air movement and clear to auscultation. Cardiovascular system: S1 & S2 heard, RRR. No JVD. Gastrointestinal system: Abdomen is nondistended, soft and nontender.  Extremities: No pedal edema. Skin: No rashes, lesions or ulcers Psychiatry: Judgement and insight appear normal. Mood & affect appropriate.    Data Reviewed:    Labs: Basic Metabolic Panel: Recent Labs  Lab 02/22/24 0804 02/22/24 0854  NA 137 133*  K 4.8 4.5  CL 102 105  CO2 17*  --   GLUCOSE 153* 149*  BUN 68* 61*  CREATININE 10.32* 11.00*  CALCIUM  8.7*  --    GFR Estimated Creatinine Clearance: 4.8 mL/min (A) (by C-G formula based on SCr of 11 mg/dL (H)). Liver Function Tests: Recent Labs  Lab 02/22/24 0804  AST 21  ALT 24  ALKPHOS 85  BILITOT 0.8  PROT 6.5  ALBUMIN  3.3*   No results for input(s): LIPASE, AMYLASE in the last 168 hours. No results for input(s): AMMONIA in the last 168 hours. Coagulation profile No results  for input(s): INR, PROTIME in the last 168 hours. COVID-19 Labs  No results for input(s): DDIMER, FERRITIN, LDH, CRP in the last 72 hours.  Lab Results  Component Value Date   SARSCOV2NAA NEGATIVE 01/25/2024   SARSCOV2NAA NEGATIVE 11/27/2023   SARSCOV2NAA NEGATIVE 07/07/2023   SARSCOV2NAA NEGATIVE 06/02/2022    CBC: Recent Labs  Lab 02/22/24 0804 02/22/24 0854  WBC 6.2  --   NEUTROABS 5.3  --   HGB 10.1* 11.6*  HCT 34.8* 34.0*  MCV 127.5*  --   PLT 116*  --    Cardiac Enzymes: No results for input(s): CKTOTAL, CKMB, CKMBINDEX, TROPONINI in the last 168 hours. BNP (last 3 results) No results for input(s): PROBNP in the last 8760 hours. CBG: No results for input(s): GLUCAP in the last 168 hours. D-Dimer: No results for input(s): DDIMER in the last 72 hours. Hgb A1c: No results for input(s): HGBA1C in the last 72 hours. Lipid Profile: No results for  input(s): CHOL, HDL, LDLCALC, TRIG, CHOLHDL, LDLDIRECT in the last 72 hours. Thyroid function studies: No results for input(s): TSH, T4TOTAL, T3FREE, THYROIDAB in the last 72 hours.  Invalid input(s): FREET3 Anemia work up: No results for input(s): VITAMINB12, FOLATE, FERRITIN, TIBC, IRON, RETICCTPCT in the last 72 hours. Sepsis Labs: Recent Labs  Lab 02/22/24 0804  WBC 6.2   Microbiology No results found for this or any previous visit (from the past 240 hours).   Medications:    hydrocortisone  sod succinate (SOLU-CORTEF ) inj  100 mg Intravenous BID   midodrine   30 mg Oral TID   multivitamin  1 tablet Oral Daily   pantoprazole   40 mg Oral BID   sertraline   25 mg Oral QHS   sevelamer  carbonate  1,600 mg Oral TID WC   Continuous Infusions:  heparin  1,450 Units/hr (02/23/24 0356)      LOS: 0 days   Joshua Riley  [facility]  02/23/2024, 6:10 AM

## 2024-02-23 NOTE — Plan of Care (Signed)
  Problem: Education: Goal: Knowledge of disease and its progression will improve Outcome: Progressing Goal: Individualized Educational Video(s) Outcome: Progressing   Problem: Fluid Volume: Goal: Compliance with measures to maintain balanced fluid volume will improve Outcome: Progressing   Problem: Health Behavior/Discharge Planning: Goal: Ability to manage health-related needs will improve Outcome: Progressing   Problem: Nutritional: Goal: Ability to make healthy dietary choices will improve Outcome: Progressing

## 2024-02-23 NOTE — Procedures (Signed)
 Interventional Radiology Procedure Note  Procedure: Successful declot of left thigh AV graft.   Complications: None  Estimated Blood Loss: None  Recommendations:  - May resume dialysis   Signed,  Wilkie LOIS Lent, MD

## 2024-02-23 NOTE — Progress Notes (Signed)
  [facility] Progress Note   Subjective: Seen in room. No new events. Denies cp, sob.  For thrombectomy with IR today. Plan for HD later today after declot.   Objective Vitals:   02/22/24 1655 02/22/24 2110 02/23/24 0443 02/23/24 0737  BP: 100/82 90/70 99/77  100/65  Pulse: (!) 102 66 (!) 56 (!) 56  Resp: 18 18 18 18   Temp: 98 F (36.7 C) 98.7 F (37.1 C) 98 F (36.7 C)   TempSrc: Oral     SpO2: 99% 100% 99% 99%  Weight:      Height:         Additional Objective Labs: Basic Metabolic Panel: Recent Labs  Lab 02/22/24 0804 02/22/24 0854 02/23/24 0526  NA 137 133* 135  K 4.8 4.5 6.0*  CL 102 105 102  CO2 17*  --  21*  GLUCOSE 153* 149* 125*  BUN 68* 61* 77*  CREATININE 10.32* 11.00* 11.67*  CALCIUM  8.7*  --  8.7*   CBC: Recent Labs  Lab 02/22/24 0804 02/22/24 0854 02/23/24 0526  WBC 6.2  --  4.1  NEUTROABS 5.3  --   --   HGB 10.1* 11.6* 10.2*  HCT 34.8* 34.0* 33.1*  MCV 127.5*  --  119.5*  PLT 116*  --  124*   Blood Culture    Component Value Date/Time   SDES  01/25/2024 1909    LEFT ANTECUBITAL Performed at [facility], [address] [place], [place] [address]    [facility]  01/25/2024 1909    BOTTLES DRAWN AEROBIC AND ANAEROBIC Blood Culture adequate volume Performed at [facility], [address], [place], [place] [address]    CULT  01/25/2024 1909    NO GROWTH 7 DAYS Performed at [facility], [address], [place], [place] [address]    REPTSTATUS 02/01/2024 FINAL 01/25/2024 1909     Physical Exam General: Alert, nad Heart: RRR Lungs: Clear, normal wob Abdomen: non tender Extremities: 1+ bilat LE edema  Dialysis Access: L thigh AVG clotted   Medications:  heparin  1,300 Units/hr (02/23/24 1019)    hydrocortisone  sod succinate (SOLU-CORTEF ) inj  100 mg Intravenous BID   midodrine   30 mg Oral TID   multivitamin  1 tablet Oral Daily   pantoprazole   40 mg Oral BID    sertraline   25 mg Oral QHS   sevelamer  carbonate  1,600 mg Oral TID WC    Dialysis Orders:  [facility] MWF 2:45 350/1.5x EDW 72 kg  2/2.5 AVG No heparin   -Mircera 200 q 2 wks (Last 8/4) -Calcitriol  0.5 q HD   Assessment/Plan: Clotted L fem AVG: IR consulted for declot AVG +/- other needs on 8/19.  ESRD: HD MWF. Had HD last wk on Monday and Thursday. HD today after access established.  Hyperkalemia. Due to missed dialysis.  Currently NPO.  Will correct with HD today.  BP: chronic hypotension on midodrine  10 tid, cont.  Volume: chronic mild pretib edema, small gainer, usual UF 1.5L. UF 2 L w/ next HD.  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  MBD: Sevelamer  binder ordered. Resume VDRA if stays in hospital.  L percutaneous nephrostomy tube: f/b IR as outpt Recurrent presacral fluid collection drain: f/b IR outpatient Adrenal insufficiency: taking hydrocortisone  20mg  am+ 10mg  pm    Maisie Ronnald Acosta PA-C [facility] 02/23/2024,10:39 AM

## 2024-02-24 ENCOUNTER — Ambulatory Visit

## 2024-02-24 ENCOUNTER — Inpatient Hospital Stay (HOSPITAL_COMMUNITY)

## 2024-02-24 ENCOUNTER — Telehealth: Payer: Self-pay | Admitting: *Deleted

## 2024-02-24 DIAGNOSIS — E875 Hyperkalemia: Secondary | ICD-10-CM | POA: Diagnosis not present

## 2024-02-24 LAB — CBC
HCT: 32.4 % — ABNORMAL LOW (ref 39.0–52.0)
Hemoglobin: 10.2 g/dL — ABNORMAL LOW (ref 13.0–17.0)
MCH: 36.7 pg — ABNORMAL HIGH (ref 26.0–34.0)
MCHC: 31.5 g/dL (ref 30.0–36.0)
MCV: 116.5 fL — ABNORMAL HIGH (ref 80.0–100.0)
Platelets: 126 K/uL — ABNORMAL LOW (ref 150–400)
RBC: 2.78 MIL/uL — ABNORMAL LOW (ref 4.22–5.81)
RDW: 18.5 % — ABNORMAL HIGH (ref 11.5–15.5)
WBC: 5.3 K/uL (ref 4.0–10.5)
nRBC: 0.4 % — ABNORMAL HIGH (ref 0.0–0.2)

## 2024-02-24 LAB — BASIC METABOLIC PANEL WITH GFR
Anion gap: 15 (ref 5–15)
BUN: 54 mg/dL — ABNORMAL HIGH (ref 8–23)
CO2: 23 mmol/L (ref 22–32)
Calcium: 8.4 mg/dL — ABNORMAL LOW (ref 8.9–10.3)
Chloride: 94 mmol/L — ABNORMAL LOW (ref 98–111)
Creatinine, Ser: 7.88 mg/dL — ABNORMAL HIGH (ref 0.61–1.24)
GFR, Estimated: 6 mL/min — ABNORMAL LOW (ref >60)
Glucose, Bld: 184 mg/dL — ABNORMAL HIGH (ref 70–99)
Potassium: 4.8 mmol/L (ref 3.5–5.1)
Sodium: 132 mmol/L — ABNORMAL LOW (ref 135–145)

## 2024-02-24 LAB — HEPARIN LEVEL (UNFRACTIONATED): Heparin Unfractionated: 0.41 [IU]/mL (ref 0.30–0.70)

## 2024-02-24 LAB — APTT: aPTT: 74 s — ABNORMAL HIGH (ref 24–36)

## 2024-02-24 MED ORDER — CHLORHEXIDINE GLUCONATE CLOTH 2 % EX PADS: 6.0000 | MEDICATED_PAD | Freq: Every day | CUTANEOUS | Status: DC

## 2024-02-24 MED ORDER — MIDAZOLAM HCL 2 MG/2ML IJ SOLN
INTRAMUSCULAR | Status: AC | PRN
  Administered 2024-02-24: 1 mg via INTRAVENOUS

## 2024-02-24 MED ORDER — FENTANYL CITRATE (PF) 100 MCG/2ML IJ SOLN
INTRAMUSCULAR | Status: AC
  Filled 2024-02-24: qty 2

## 2024-02-24 MED ORDER — MIDAZOLAM HCL 2 MG/2ML IJ SOLN
INTRAMUSCULAR | Status: AC
  Filled 2024-02-24: qty 2

## 2024-02-24 MED ORDER — FENTANYL CITRATE (PF) 100 MCG/2ML IJ SOLN
INTRAMUSCULAR | Status: AC | PRN
  Administered 2024-02-24 (×2): 12.5 ug via INTRAVENOUS

## 2024-02-24 MED ORDER — SODIUM CHLORIDE 0.9% FLUSH
5.0000 mL | Freq: Three times a day (TID) | INTRAVENOUS | Status: DC
  Administered 2024-02-24: 5 mL

## 2024-02-24 MED ORDER — LIDOCAINE 1 % OPTIME INJ - NO CHARGE
10.0000 mL | Freq: Once | INTRAMUSCULAR | Status: AC
  Administered 2024-02-24: 10 mL
  Filled 2024-02-24: qty 10

## 2024-02-24 NOTE — Discharge Summary (Addendum)
 Physician Discharge Summary   Patient: Joshua Riley MRN: [id number] DOB: 1936/03/07  Admit date:     02/22/2024  Discharge date: 02/24/24  Discharge Physician: Drue ONEIDA Potter   PCP: Katrinka Garnette KIDD, MD   Recommendations at discharge:  Follow-up with nephrology  Hospital Course:  Joshua Riley is an 88 y.o. 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 Missed dialysis on 02/19/2024 and on 02/17/2024 secondary to a fall. Nephrology has ordered a section of dialysis for patient and dissection will be tomorrow that can be done as an outpatient according to nephrology Interventional radiology took patient for dialysis access declotting yesterday.   Presacral drainag the area of the left presacral region with drain placement on 01/07/2024 This was repositioned by IR and is working well   Adrenal insufficiency: Blood pressures relatively stable continue current dose of steroids and midodrine .   Paroxysmal atrial fibrillation: Heparin  drip switched to Eliquis  with no evidence of bleeding   Ulcerative colitis with a history of abdominal ileostomy with a colostomy: Status post colectomy with ileostomy. Appears to be stable.   Asthma: Appears to be stable.   History of small bowel obstruction: With a recent discharge from the hospital on 02/03/2024. Was treated conservatively.   History of obstructive uropathy: With nephrostomy tube in place. Had a  left percutaneous nephrostomy tube exchange on 02/19/2024.   Patient has been cleared by nephrology for discharge today and will follow-up as an outpatient  Consultants: Palliative, nephrology, IR Procedures performed: As mentioned above Disposition: Home Diet recommendation:  Renal diet DISCHARGE MEDICATION: Allergies as of 02/24/2024       Reactions   Baclofen Other (See Comments)   Altered mental status, after accidental overdose   Cephalosporins Rash   Ciprofloxacin Itching, Rash   Wound Dressing Adhesive Rash        Medication List     TAKE these medications    albuterol  (2.5 MG/3ML) 0.083% nebulizer solution Commonly known as: PROVENTIL  Take 2.5 mg by nebulization every 6 (six) hours as needed for wheezing or shortness of breath. What changed: Another medication with the same name was changed. Make sure you understand how and when to take each.   albuterol  108 (90 Base) MCG/ACT inhaler Commonly known as: VENTOLIN  HFA Inhale 1-2 puffs into the lungs every 6 (six) hours as needed for wheezing or shortness of breath. What changed:  how much to take when to take this   atorvastatin  20 MG tablet Commonly known as: LIPITOR Take 1 tablet (20 mg total) by mouth daily.   budesonide  0.5 MG/2ML nebulizer solution Commonly known as: PULMICORT  Take 0.5 mg by nebulization 2 (two) times daily as needed (for respiratory flares).   calcitRIOL  0.5 MCG capsule Commonly known as: ROCALTROL  Take 1 capsule (0.5 mcg total) by mouth every Monday, Wednesday, and Friday with hemodialysis.   dorzolamide  2 % ophthalmic solution Commonly known as: TRUSOPT  Place 1 drop into the right eye at bedtime.   Eliquis  2.5 MG Tabs tablet Generic drug: apixaban  Take 1 tablet (2.5 mg total) by mouth 2 (two) times  daily.   fludrocortisone  0.1 MG tablet Commonly known as: FLORINEF  Take 1 tablet (0.1 mg total) by mouth daily.   folic acid  1 MG tablet Commonly known as: FOLVITE  Take 1 tablet  (1 mg total) by mouth daily.   gabapentin  300 MG capsule Commonly known as: NEURONTIN  Take 300 mg by mouth daily as needed (Pain).   hydrocortisone  10 MG tablet Commonly known as: CORTEF  Take 2 tablets (20 mg total) by mouth daily and take 1 tablet (10 mg total) by mouth every evening. What changed: Another medication with the same name was removed. Continue taking this medication, and follow the directions you see here.   midodrine  10 MG tablet Commonly known as: PROAMATINE  Take 3 tablets (30 mg total) by mouth 3 (three) times daily.   mometasone -formoterol  200-5 MCG/ACT Aero Commonly known as: DULERA  Inhale 2 puffs into the lungs 2 (two) times daily.   multivitamin Tabs tablet Take 1 tablet by mouth daily.   Omega-3 Gummies Chew Chew 1 tablet by mouth daily.   pantoprazole  40 MG tablet Commonly known as: PROTONIX  Take 1 tablet (40 mg total) by mouth 2 (two) times daily.   ramelteon  8 MG tablet Commonly known as: ROZEREM  Take 1 tablet (8 mg total) by mouth at bedtime.   Saline Flush 0.9 % Soln Use 5 mLs by Intracatheter route daily as directed.   sertraline  50 MG tablet Commonly known as: ZOLOFT  Take 25 mg by mouth at bedtime.   sevelamer  carbonate 800 MG tablet Commonly known as: RENVELA  Take 1,600 mg by mouth 3 (three) times daily with meals.   SLEEP AID PO Take 1 Dose by mouth at bedtime. Sleep aid of unknown name. OTC.   timolol  0.5 % ophthalmic solution Commonly known as: TIMOPTIC  Place 1 drop into both eyes in the morning and at bedtime.   traMADol  50 MG tablet Commonly known as: ULTRAM  Take 1 tablet (50 mg total) by mouth every 8 (eight) hours as needed (for pain).   Tylenol  325 MG tablet Generic drug: acetaminophen  Take 325-650 mg by mouth every 6 (six) hours as needed for mild pain (pain score 1-3) or headache.   VITAMIN B-12 PO Take 1 capsule by mouth in the morning and at bedtime.        Follow-up Information     Vanice Sharper, MD. Call.    Specialties: Interventional Radiology, Radiology Contact information: [address] [place] [address] [phone]         Geralynn Charleston, MD. Call.   Specialty: Nephrology Contact information: [address] [place] [address] [phone]                Discharge Exam: Joshua Riley   02/22/24 0753 02/22/24 1349  Weight: 72.6 kg 72 kg   General exam: In no acute distress. Respiratory system: Good air movement and clear to auscultation. Cardiovascular system: S1 & S2 heard, RRR. No JVD. Gastrointestinal system: Abdomen is nondistended, soft and nontender.  Extremities: No pedal edema. Skin: No rashes, lesions or ulcers, drain in place and working well Psychiatry: Judgement and insight appear normal. Mood & affect appropriate.     Condition at discharge: good  The results of significant diagnostics from this hospitalization (including imaging, microbiology, ancillary and laboratory) are listed below for reference.   Imaging Studies: CT GUIDED SOFT TISSUE FLUID DRAIN BY PERC CATH Result Date: 02/24/2024 INDICATION: Recurrent presacral abscess EXAM: CT GUIDED DRAINAGE OF THE RECURRENT PRESACRAL ABSCESS ABSCESS TECHNIQUE: Multidetector CT imaging of the  PELVIS was performed following the standard protocol WITHOUT IV contrast. RADIATION DOSE REDUCTION: This exam was performed according to the departmental dose-optimization program which includes automated exposure control, adjustment of the mA and/or kV according to patient size and/or use of iterative reconstruction technique. MEDICATIONS: The patient is currently admitted to the hospital and receiving intravenous antibiotics. The antibiotics were administered within an appropriate time frame prior to the initiation of the procedure. ANESTHESIA/SEDATION: Moderate (conscious) sedation was employed during this procedure. A total of Versed  1.0 mg and Fentanyl  25 mcg was administered intravenously by the  radiology nurse. Total intra-service moderate Sedation Time: 5 15 minutes. The patient's level of consciousness and vital signs were monitored continuously by radiology nursing throughout the procedure under my direct supervision. COMPLICATIONS: None immediate. TECHNIQUE: Informed written consent was obtained from the patient after a thorough discussion of the procedural risks, benefits and alternatives. All questions were addressed. Maximal Sterile Barrier Technique was utilized including caps, mask, sterile gowns, sterile gloves, sterile drape, hand hygiene and skin antiseptic. A timeout was performed prior to the initiation of the procedure. PROCEDURE: Previous imaging reviewed. Patient position nearly prone. Noncontrast localization CT performed. The recurrent presacral pelvic abscess was localized and marked for a left trans gluteal approach. Under sterile conditions and local anesthesia, the 15 cm 18 gauge access needle was advanced from a right trans gluteal approach into the abscess. Needle position confirmed with CT. Syringe aspiration yielded purulent fluid. Guidewire inserted followed by tract dilatation to insert a 10 [place] drain. Drain catheter position confirmed within the abscess with CT. Catheter secured with a silk suture and a sterile dressing. Syringe aspiration yielded total volume of 60 cc purulent fluid. External suction bulb plaque. No immediate complication. Patient tolerated the procedure well. FINDINGS: Imaging confirms needle placement in the presacral abscess for drain placement IMPRESSION: Successful CT-guided 10 French presacral abscess drain placement. Electronically Signed   By: CHRISTELLA.  Riley M.D.   On: 02/24/2024 14:49   IR US  Guide Vasc Access Left Result Date: 02/23/2024 INDICATION: 88 year old male with end-stage renal disease on hemodialysis via a left thigh arteriovenous graft. EXAM: IR THROMBECTOMY AV FISTULA W/THROMBOLYSIS/PTA INC/SHUNT/IMG LEFT; IR ULTRASOUND GUIDANCE VASC  ACCESS LEFT MEDICATIONS: 1000 units heparin ; 2 mg Versed  and 20 mcg fentanyl  administered intravenously by the Radiology nurse. Additionally, 4 mg tPA administered by me directly into the occluded graft. ANESTHESIA/SEDATION: Moderate Sedation Time:  52 minutes The patient's vital signs and level of consciousness were continuously monitored during the procedure by the interventional radiology nurse under my direct supervision. FLUOROSCOPY TIME:  Radiation exposure index: 129 mGy, reference air kerma COMPLICATIONS: None immediate. PROCEDURE: Informed written consent was obtained from the patient after a thorough discussion of the procedural risks, benefits and alternatives. All questions were addressed. Maximal Sterile Barrier Technique was utilized including caps, mask, sterile gowns, sterile gloves, sterile drape, hand hygiene and skin antiseptic. A timeout was performed prior to the initiation of the procedure. The arteriovenous graft was interrogated with ultrasound and found to completely thrombosed. An image was obtained and stored for the medical record. Local anesthesia was attained by infiltration with 1% lidocaine . A small dermatotomy was made. Under real-time sonographic guidance, the graft was punctured proximally near the arterial anastomosis in antegrade fashion directed toward the venous outflow with a 21 gauge micropuncture needle. Using standard technique, the initial micro needle was exchanged over a 0.018 micro wire for a transitional 4 [place] micro sheath. 4 mg tPA was then instilled through the micro sheath into  the arterial limb of the loop graft. A second ultrasound-guided access was then performed. Local anesthesia was attained by infiltration with 1% lidocaine . A small dermatotomy was made. Under real-time sonographic guidance, the graft was punctured in retrograde fashion near the venous anastomosis directed toward the arterial inflow. The micro needle was exchanged over a 0.018 microwire for  a transitional 4 [place] micro sheath. 4 mg tPA was then instilled through the micro sheath into the venous limb of the loop graft. Attention was turned back to the antegrade access. A 0.035 Amplatz wire was advanced into the graft. The micro sheath was exchanged for a 6 French vascular sheath. A 5 French angled catheter was introduced over a glidewire and navigated into the left iliac vein. Venography was performed of the central veins. Widely patent left iliac veins and vena cava. No evidence of central stenosis. A pull-back venogram was then performed. The graft is thrombosed. No definite stenosis at the venous anastomosis. A Bentson wire was advanced into the central veins. The 5 French catheter was removed. A 6 by 80 mm mustang balloon was advanced over the wire and into the loop graft. Sequential angioplasty was then performed throughout the loop graft macerating the thrombus. Angioplasty was also performed at the venous anastomosis. There was no wasting of the balloon to suggest the presence of an underlying stenosis. The balloon was removed. Attention was turned to the retrograde sheath. This was exchanged over an Amplatz wire for a 7 [place] vascular sheath. The angled Kumpe the catheter and Glidewire were reintroduced through the retrograde sheath and navigated into the left external iliac artery. An arteriogram was performed. The inflow to the graft remains completely occluded. Vascular flow is relatively slow suggesting low cardiac output. A Bentson wire was advanced into the iliac artery. The 5 French catheter was removed. A 5 French Fogarty catheter was advanced. The balloon was inflated and used to pull the arterial plug. This maneuver was repeated several times in till there was restoration of inflow into the graft. Additionally, given difficulty with pulling the arterial plug balloon angioplasty was performed of the arterial anastomosis utilizing a 4 x 40 mm mustang balloon. After this, the Fogarty  was able to successfully pull the arterial plug. Follow-up arteriography demonstrates a widely patent arteriovenous graft with brisk flow. No evidence of residual thrombus or stenosis. The access sheaths were removed and hemostasis attained with the assistance of per string sutures. There is an excellent palpable pulse throughout the graft. IMPRESSION: Successful declot of thrombosed left thigh graft. No definite stenosis or inciting reason for thrombosis. However, it does appear that cardiac output is somewhat low. It is possible the patient became transiently hypotensive or that external compression resulted in thrombosis of the graft. ACCESS: This access remains amenable to future percutaneous interventions as clinically indicated. Electronically Signed   By: Joshua Riley M.D.   On: 02/23/2024 17:27   IR THROMBECTOMY AV FISTULA W/THROMBOLYSIS/PTA INC/SHUNT/IMG LEFT Result Date: 02/23/2024 INDICATION: 88 year old male with end-stage renal disease on hemodialysis via a left thigh arteriovenous graft. EXAM: IR THROMBECTOMY AV FISTULA W/THROMBOLYSIS/PTA INC/SHUNT/IMG LEFT; IR ULTRASOUND GUIDANCE VASC ACCESS LEFT MEDICATIONS: 1000 units heparin ; 2 mg Versed  and 20 mcg fentanyl  administered intravenously by the Radiology nurse. Additionally, 4 mg tPA administered by me directly into the occluded graft. ANESTHESIA/SEDATION: Moderate Sedation Time:  52 minutes The patient's vital signs and level of consciousness were continuously monitored during the procedure by the interventional radiology nurse under my direct supervision. FLUOROSCOPY TIME:  Radiation  exposure index: 129 mGy, reference air kerma COMPLICATIONS: None immediate. PROCEDURE: Informed written consent was obtained from the patient after a thorough discussion of the procedural risks, benefits and alternatives. All questions were addressed. Maximal Sterile Barrier Technique was utilized including caps, mask, sterile gowns, sterile gloves, sterile  drape, hand hygiene and skin antiseptic. A timeout was performed prior to the initiation of the procedure. The arteriovenous graft was interrogated with ultrasound and found to completely thrombosed. An image was obtained and stored for the medical record. Local anesthesia was attained by infiltration with 1% lidocaine . A small dermatotomy was made. Under real-time sonographic guidance, the graft was punctured proximally near the arterial anastomosis in antegrade fashion directed toward the venous outflow with a 21 gauge micropuncture needle. Using standard technique, the initial micro needle was exchanged over a 0.018 micro wire for a transitional 4 [place] micro sheath. 4 mg tPA was then instilled through the micro sheath into the arterial limb of the loop graft. A second ultrasound-guided access was then performed. Local anesthesia was attained by infiltration with 1% lidocaine . A small dermatotomy was made. Under real-time sonographic guidance, the graft was punctured in retrograde fashion near the venous anastomosis directed toward the arterial inflow. The micro needle was exchanged over a 0.018 microwire for a transitional 4 [place] micro sheath. 4 mg tPA was then instilled through the micro sheath into the venous limb of the loop graft. Attention was turned back to the antegrade access. A 0.035 Amplatz wire was advanced into the graft. The micro sheath was exchanged for a 6 French vascular sheath. A 5 French angled catheter was introduced over a glidewire and navigated into the left iliac vein. Venography was performed of the central veins. Widely patent left iliac veins and vena cava. No evidence of central stenosis. A pull-back venogram was then performed. The graft is thrombosed. No definite stenosis at the venous anastomosis. A Bentson wire was advanced into the central veins. The 5 French catheter was removed. A 6 by 80 mm mustang balloon was advanced over the wire and into the loop graft. Sequential  angioplasty was then performed throughout the loop graft macerating the thrombus. Angioplasty was also performed at the venous anastomosis. There was no wasting of the balloon to suggest the presence of an underlying stenosis. The balloon was removed. Attention was turned to the retrograde sheath. This was exchanged over an Amplatz wire for a 7 [place] vascular sheath. The angled Kumpe the catheter and Glidewire were reintroduced through the retrograde sheath and navigated into the left external iliac artery. An arteriogram was performed. The inflow to the graft remains completely occluded. Vascular flow is relatively slow suggesting low cardiac output. A Bentson wire was advanced into the iliac artery. The 5 French catheter was removed. A 5 French Fogarty catheter was advanced. The balloon was inflated and used to pull the arterial plug. This maneuver was repeated several times in till there was restoration of inflow into the graft. Additionally, given difficulty with pulling the arterial plug balloon angioplasty was performed of the arterial anastomosis utilizing a 4 x 40 mm mustang balloon. After this, the Fogarty was able to successfully pull the arterial plug. Follow-up arteriography demonstrates a widely patent arteriovenous graft with brisk flow. No evidence of residual thrombus or stenosis. The access sheaths were removed and hemostasis attained with the assistance of per string sutures. There is an excellent palpable pulse throughout the graft. IMPRESSION: Successful declot of thrombosed left thigh graft. No definite stenosis or inciting reason  for thrombosis. However, it does appear that cardiac output is somewhat low. It is possible the patient became transiently hypotensive or that external compression resulted in thrombosis of the graft. ACCESS: This access remains amenable to future percutaneous interventions as clinically indicated. Electronically Signed   By: Joshua Riley M.D.   On: 02/23/2024  17:27   DG Chest Port 1 View Result Date: 02/22/2024 CLINICAL DATA:  Clogged dialysis graft in the left groin. EXAM: PORTABLE CHEST 1 VIEW COMPARISON:  01/25/2024 and CT chest 11/09/2023. FINDINGS: Trachea is midline. Heart is enlarged, stable. Trace interstitial prominence and indistinctness with small bilateral pleural effusions. IMPRESSION: Mild congestive heart failure. Electronically Signed   By: Joshua Riley M.D.   On: 02/22/2024 08:22   IR Catheter Tube Change Result Date: 02/19/2024 CLINICAL DATA:  Patient with a history of chronic presacral abscess with drain placement in IR 01/07/2024 EXAM: IR CATHETER TUBE CHANGE COMPARISON:  None Available. CONTRAST:  10 mL Omnipaque  300-administered via the existing percutaneous drain. FLUOROSCOPY TIME:  Radiation exposure index as provided by the fluoroscopic device 91.7 mGy TECHNIQUE: The patient was positioned prone on the fluoroscopy table. A pre-procedural spot fluoroscopic image was obtained of the presacral/pelvic area and the existing percutaneous drainage catheter. Multiple spot fluoroscopic and radiographic images were obtained following the injection of a small amount of contrast via the existing percutaneous drainage catheter. FINDINGS: Contrast injection confirmed the catheter was not in communication with the presacral abscess. The external portion of the percutaneous drainage catheter was cut and cannulated with a short wire. Under intermittent fluoroscopic guidance the existing percutaneous drainage catheter was removed. A new 8 French mini loop percutaneous drainage catheter was passed over the wire without successful placement into the abscess collection. The catheter was removed and the site was covered with an appropriate dressing. IMPRESSION: Existing percutaneous catheter was retracted and not in communication with the presacral abscess. A drain exchange was attempted but placement into the abscess collection was unsuccessful. The patient  will be scheduled early next week for new drain placement under CT guidance with moderate sedation. Electronically Signed   By: Joshua Riley M.D.   On: 02/19/2024 16:45   IR Catheter Tube Change Result Date: 02/19/2024 INDICATION: Left percutaneous nephrostomy tube requiring routine exchange. EXAM: FLUOROSCOPIC GUIDED left SIDED NEPHROSTOMY CATHETER EXCHANGE COMPARISON:  IR nephrostomy exchange left 10/22/2023 CONTRAST:  10 mL Isovue -300 administered into the collecting system FLUOROSCOPY TIME:  Radiation exposure index as provided by the fluoroscopic device 91.7 mGy [place] COMPLICATIONS: None immediate. TECHNIQUE: Informed written consent was obtained from the patient after a discussion of the risks, benefits and alternatives to treatment. Questions regarding the procedure were encouraged and answered. A timeout was performed prior to the initiation of the procedure. The left flank and external portion of existing nephrostomy catheter were prepped and draped in the usual sterile fashion. A sterile drape was applied covering the operative field. Maximum barrier sterile technique with sterile gowns and gloves were used for the procedure. A timeout was performed prior to the initiation of the procedure. A pre procedural spot fluoroscopic image was obtained after contrast was injected via the existing nephrostomy catheter demonstrating appropriate positioning within the renal pelvis. The existing nephrostomy catheter was cut and cannulated with a Benson wire which was coiled within the renal pelvis. Under intermittent fluoroscopic guidance, the existing nephrostomy catheter was exchanged for a new 10 [place] all-purpose drainage catheter. Contrast injection confirmed appropriate positioning within the renal pelvis and a post exchange fluoroscopic image was  obtained. The catheter was locked and secured to the skin with a suture. A dressing was placed. The patient tolerated the procedure well without immediate  postprocedural complication. FINDINGS: The existing nephrostomy catheter is appropriately positioned and functioning. After successful fluoroscopic guided exchange, the new nephrostomy catheter is coiled and locked within the left renal pelvis. IMPRESSION: Successful fluoroscopic guided exchange of left sided 10.2 French percutaneous nephrostomy catheter. Procedure performed by Warren Dais, NP Electronically Signed   By: Joshua Riley M.D.   On: 02/19/2024 16:44   IR Radiologist Eval & Mgmt Result Date: 02/19/2024 EXAM: See EPIC note CHIEF COMPLAINT: See EPIC note HISTORY OF PRESENT ILLNESS: See EPIC note REVIEW OF SYSTEMS: See EPIC note PHYSICAL EXAMINATION: See EPIC note ASSESSMENT AND PLAN: See EPIC note Electronically Signed   By: Joshua Riley M.D.   On: 02/19/2024 16:12   ECHOCARDIOGRAM LIMITED Result Date: 01/29/2024    ECHOCARDIOGRAM LIMITED REPORT   Patient Name:   Joshua Riley Date of Exam: 01/29/2024 Medical Rec #:  [id number]       Height:       68.0 in Accession #:    [id number]      Weight:       164.9 lb Date of Birth:  09/29/1935       BSA:          1.883 m Patient Age:    87 years        BP:           106/71 mmHg Patient Gender: M               HR:           87 bpm. Exam Location:  Inpatient Procedure: Limited Echo (Both Spectral and Color Flow Doppler were utilized            during procedure). Indications:    I31.3 Pericardial effusion (noninflammatory)  History:        Patient has prior history of Echocardiogram examinations, most                 recent 01/26/2024. COPD and ESRD, Arrythmias:Atrial Fibrillation;                 Risk Factors:Sleep Apnea.  Sonographer:    Joshua Riley RDCS Referring Phys: [id number] BRIDGETTE CHRISTOPHER  Sonographer Comments: Limited to reassess effusion IMPRESSIONS  1. Effusion appears much smaller than prior. Epicardial fat visualized. There is no evidence of cardiac tamponade.  2. The inferior vena cava is dilated in size with <50% respiratory  variability, suggesting right atrial pressure of 15 mmHg. FINDINGS  Left Ventricle: Pericardium: Effusion appears much smaller than prior. Epicardial fat visualized. Trivial pericardial effusion is present. There is no evidence of cardiac tamponade. Venous: The inferior vena cava is dilated in size with less than 50% respiratory variability, suggesting right atrial pressure of 15 mmHg. Joshua Riley Electronically signed by Joshua Riley Signature Date/Time: 01/29/2024/1:33:26 PM    Final    DG Abd Portable 1V Result Date: 01/29/2024 CLINICAL DATA:  [id number].  Small-bowel obstruction. EXAM: PORTABLE ABDOMEN - 1 VIEW COMPARISON:  Portable abdomen film yesterday at 9:31 a.m. FINDINGS: 4:50 a.m. Again noted are a left percutaneous nephrostomy and a left-sided transgluteal pelvic pigtail drainage catheter, right-sided pelvic penile implant reservoir. There is an ostomy port again in the right mid abdomen. No dilated small bowel is seen, no supine evidence of free air. 2.6 cm rim calcified gallstone again noted right upper abdomen. No urinary  stone is evident. NGT is in place with tip in the distal stomach. Cardiomegaly is partially visible. Osteopenia and degenerative change thoracic and lumbar spine. Overall bowel aeration unchanged since yesterday. IMPRESSION: 1. No dilated small bowel is seen, no supine evidence of free air. 2. NGT tip in the distal stomach. 3. Cholelithiasis. 4. Cardiomegaly. 5. Left percutaneous nephrostomy and left-sided transgluteal pelvic pigtail drainage catheter. Electronically Signed   By: Francis Quam M.D.   On: 01/29/2024 06:12   DG Abd Portable 1V Result Date: 01/28/2024 CLINICAL DATA:  Small-bowel obstruction, abdominal pain EXAM: PORTABLE ABDOMEN - 1 VIEW COMPARISON:  01/27/2024 FINDINGS: Left percutaneous nephrostomy and left pelvic transgluteal pigtail drainage catheter are unchanged from prior CT examination 01/25/2024. Penile prosthesis in place with reservoir noted within  the right hemipelvis. Ostomy appliance noted within the right mid abdomen. Normal abdominal gas pattern. No gross free intraperitoneal gas. No nephro or urolithiasis. No acute bone abnormality. IMPRESSION: 1. Nonobstructive bowel gas pattern. 2. Left percutaneous nephrostomy and left pelvic transgluteal drainage catheter in place. Electronically Signed   By: Dorethia Molt M.D.   On: 01/28/2024 13:24   VAS US  LOWER EXTREMITY VENOUS (DVT) Result Date: 01/28/2024  Lower Venous DVT Study Patient Name:  Joshua Riley  Date of Exam:   01/27/2024 Medical Rec #: [id number]        Accession #:    [id number] Date of Birth: [birth year]        Patient Gender: M Patient Age:   63 years Exam [facility]:  [facility] Procedure:      VAS US  LOWER EXTREMITY VENOUS (DVT) Referring Phys: NORLEEN PAYNE --------------------------------------------------------------------------------  Indications: Swelling, and Edema.  Limitations: Bandages and urine bag on left thigh. Comparison Study: No prior Performing Technologist: Lataco Crump,RVT  Examination Guidelines: A complete evaluation includes B-mode imaging, spectral Doppler, color Doppler, and power Doppler as needed of all accessible portions of each vessel. Bilateral testing is considered an integral part of a complete examination. Limited examinations for reoccurring indications may be performed as noted. The reflux portion of the exam is performed with the patient in reverse Trendelenburg.  +---------+---------------+---------+-----------+----------+--------------+ RIGHT    CompressibilityPhasicitySpontaneityPropertiesThrombus Aging +---------+---------------+---------+-----------+----------+--------------+ CFV      Full           Yes      Yes                                 +---------+---------------+---------+-----------+----------+--------------+ SFJ      Full                                                         +---------+---------------+---------+-----------+----------+--------------+ FV Prox  Full                                                        +---------+---------------+---------+-----------+----------+--------------+ FV Mid   Full                                                        +---------+---------------+---------+-----------+----------+--------------+  FV DistalFull                                                        +---------+---------------+---------+-----------+----------+--------------+ PFV      Full                                                        +---------+---------------+---------+-----------+----------+--------------+ POP      Full           Yes      Yes                                 +---------+---------------+---------+-----------+----------+--------------+ PTV      Full                                                        +---------+---------------+---------+-----------+----------+--------------+ PERO     Full                                                        +---------+---------------+---------+-----------+----------+--------------+   +---------+---------------+---------+-----------+----------+-------------------+ LEFT     CompressibilityPhasicitySpontaneityPropertiesThrombus Aging      +---------+---------------+---------+-----------+----------+-------------------+ CFV                                                   bandage             +---------+---------------+---------+-----------+----------+-------------------+ SFJ                                                   bandage             +---------+---------------+---------+-----------+----------+-------------------+ FV Prox  Full                                                             +---------+---------------+---------+-----------+----------+-------------------+ FV Mid   Full                                                              +---------+---------------+---------+-----------+----------+-------------------+ FV DistalFull  Thckened walls      +---------+---------------+---------+-----------+----------+-------------------+ PFV                                                   Not well visualized +---------+---------------+---------+-----------+----------+-------------------+ POP      Full                                         thickened walls     +---------+---------------+---------+-----------+----------+-------------------+ PTV      Full                                                             +---------+---------------+---------+-----------+----------+-------------------+ PERO                                                  Not well visualized +---------+---------------+---------+-----------+----------+-------------------+     Summary: RIGHT: - There is no evidence of deep vein thrombosis in the lower extremity.  - No cystic structure found in the popliteal fossa.  LEFT: - There is no evidence of deep vein thrombosis in the lower extremity. However, portions of this examination were limited- see technologist comments above.  - No cystic structure found in the popliteal fossa.  *See table(s) above for measurements and observations. Electronically signed by Lonni Gaskins MD on 01/28/2024 at 8:29:23 AM.    Final    DG Abd Portable 1V Result Date: 01/27/2024 CLINICAL DATA:  Small-bowel obstruction. EXAM: PORTABLE ABDOMEN - 1 VIEW COMPARISON:  Radiographs 01/26/2024 and 01/25/2024.  CT 01/25/2024. FINDINGS: 0910 hours. Two supine views of the abdomen are submitted. The most recent examination was labeled an 8 hour delay for portable small-bowel follow-through study. Contrast has progressed into dilated small bowel and is diluted. Right mid abdominal ileostomy noted. The tip of the enteric tube is incompletely visualized, although is similar in position  within the right upper quadrant, likely in the distal stomach or proximal duodenum. Percutaneous nephrostomy and surgical drain are in place. Penile prosthesis noted. IMPRESSION: Contrast has progressed into dilated small bowel and is diluted. Findings are consistent with persistent small bowel obstruction. Electronically Signed   By: Elsie Perone M.D.   On: 01/27/2024 10:58   DG Abd Portable 1V-Small Bowel Obstruction Protocol-initial, 8 hr delay Result Date: 01/26/2024 CLINICAL DATA:  8 hour delay bowel obstruction EXAM: PORTABLE ABDOMEN - 1 VIEW COMPARISON:  01/26/2024, CT 01/25/2024 FINDINGS: Enteric tube tip overlies the gastroduodenal region. Enteral contrast within the stomach and small bowel. Dilute contrast present within dilated small bowel loops measuring up to 5.1 cm, down to the pelvis. No contrast visible in the vicinity of the right abdominal ostomy. Patient has history of proctocolectomy. Percutaneous nephrostomy tube in the left abdomen. Drainage catheter in the pelvis. Radial lucent reservoir in the right pelvis for penile implant. IMPRESSION: Enteral contrast within the stomach with dilute contrast within dilated proximal to mid small bowel to the level of the pelvis. No enteral contrast  visible in the region of the patient's right abdominal ileostomy. Electronically Signed   By: Luke Bun M.D.   On: 01/26/2024 22:19   DG Abd Portable 1V-Small Bowel Protocol-Position Verification Result Date: 01/26/2024 CLINICAL DATA:  Nasogastric tube placement. EXAM: PORTABLE ABDOMEN - 1 VIEW COMPARISON:  January 25, 2024. FINDINGS: Distal tip of nasogastric tube is seen in expected position of distal stomach. Left-sided nephrostomy is again noted. IMPRESSION: Nasogastric tube tip seen in expected position of distal stomach. Electronically Signed   By: Lynwood Landy Raddle M.D.   On: 01/26/2024 13:36   ECHOCARDIOGRAM COMPLETE Result Date: 01/26/2024    ECHOCARDIOGRAM REPORT   Patient Name:   Joshua Riley Date of Exam: 01/26/2024 Medical Rec #:  [id number]       Height:       68.0 in Accession #:    [id number]      Weight:       163.6 lb Date of Birth:  06-27-1936       BSA:          1.876 m Patient Age:    87 years        BP:           98/67 mmHg Patient Gender: M               HR:           86 bpm. Exam Location:  Inpatient Procedure: 2D Echo, Color Doppler and Cardiac Doppler (Both Spectral and Color            Flow Doppler were utilized during procedure). Indications:    I31.3 Pericardial effusion  History:        Patient has prior history of Echocardiogram examinations, most                 recent 11/10/2023. COPD, Arrythmias:Atrial Fibrillation; Risk                 Factors:Sleep Apnea.  Sonographer:    Joshua Riley RDCS Referring Phys: [phone] SUBRINA SUNDIL IMPRESSIONS  1. Left ventricular ejection fraction, by estimation, is 60 to 65%. The left ventricle has normal function. The left ventricle has no regional wall motion abnormalities. There is mild asymmetric left ventricular hypertrophy of the septal segment. Left ventricular diastolic parameters are indeterminate.  2. Right ventricular systolic function is normal. The right ventricular size is normal. There is normal pulmonary artery systolic pressure. The estimated right ventricular systolic pressure is 33.0 mmHg.  3. Moderate pericardial effusion. The pericardial effusion is circumferential. No evidence of right ventricular collapse or right atrial collapse.  4. The mitral valve is degenerative. Trivial mitral valve regurgitation.  5. The aortic valve has an indeterminant number of cusps. Aortic valve regurgitation is trivial. Aortic valve sclerosis is present, with no evidence of aortic valve stenosis.  6. The inferior vena cava is dilated in size with <50% respiratory variability, suggesting right atrial pressure of 15 mmHg. Comparison(s): Prior images reviewed side by side. New pericardial effusion. FINDINGS  Left Ventricle: Left ventricular  ejection fraction, by estimation, is 60 to 65%. The left ventricle has normal function. The left ventricle has no regional wall motion abnormalities. The left ventricular internal cavity size was normal in size. There is  mild asymmetric left ventricular hypertrophy of the septal segment. Left ventricular diastolic function could not be evaluated due to atrial fibrillation. Left ventricular diastolic parameters are indeterminate. Right Ventricle: The right ventricular size is normal. No increase in right  ventricular wall thickness. Right ventricular systolic function is normal. There is normal pulmonary artery systolic pressure. The tricuspid regurgitant velocity is 2.12 m/s, and  with an assumed right atrial pressure of 15 mmHg, the estimated right ventricular systolic pressure is 33.0 mmHg. Left Atrium: Left atrial size was normal in size. Right Atrium: Right atrial size was normal in size. Pericardium: A moderately sized pericardial effusion is present. The pericardial effusion is circumferential. The pericardial effusion appears to contain fibrous material. Mitral Valve: The mitral valve is degenerative in appearance. Trivial mitral valve regurgitation. Tricuspid Valve: The tricuspid valve is normal in structure. Tricuspid valve regurgitation is mild. Aortic Valve: The aortic valve has an indeterminant number of cusps. Aortic valve regurgitation is trivial. Aortic valve sclerosis is present, with no evidence of aortic valve stenosis. Pulmonic Valve: The pulmonic valve was normal in structure. Pulmonic valve regurgitation is not visualized. Aorta: The aortic root is normal in size and structure and the ascending aorta was not well visualized. Venous: The inferior vena cava is dilated in size with less than 50% respiratory variability, suggesting right atrial pressure of 15 mmHg. IAS/Shunts: No atrial level shunt detected by color flow Doppler.  LEFT VENTRICLE PLAX 2D LVIDd:         3.10 cm LVIDs:         2.20 cm  LV PW:         0.90 cm LV IVS:        1.10 cm LVOT diam:     1.90 cm LV SV:         42 LV SV Index:   22 LVOT Area:     2.84 cm  RIGHT VENTRICLE RV S prime:     9.64 cm/s TAPSE (M-mode): 1.7 cm LEFT ATRIUM             Index        RIGHT ATRIUM           Index LA diam:        3.70 cm 1.97 cm/m   RA Area:     21.30 cm LA Vol (A2C):   52.1 ml 27.77 ml/m  RA Volume:   57.80 ml  30.80 ml/m LA Vol (A4C):   61.2 ml 32.62 ml/m LA Biplane Vol: 58.9 ml 31.39 ml/m  AORTIC VALVE LVOT Vmax:   75.00 cm/s LVOT Vmean:  57.100 cm/s LVOT VTI:    0.147 m  AORTA Ao Root diam: 3.70 cm TRICUSPID VALVE TR Peak grad:   18.0 mmHg TR Vmax:        212.00 cm/s  SHUNTS Systemic VTI:  0.15 m Systemic Diam: 1.90 cm Stanly Leavens MD Electronically signed by Stanly Leavens MD Signature Date/Time: 01/26/2024/12:27:36 PM    Final    DG Abdomen 1 View Result Date: 01/25/2024 CLINICAL DATA:  NG tube placement. EXAM: ABDOMEN - 1 VIEW COMPARISON:  September 07, 2023 FINDINGS: A nasogastric tube is seen with its distal tip overlying the expected region of the gastric antrum. The bowel gas pattern is normal. A left-sided percutaneous of proximally tube is in place. No radio-opaque calculi or other significant radiographic abnormality are seen. IMPRESSION: Nasogastric tube positioning, as described above. Electronically Signed   By: Suzen Dials M.D.   On: 01/25/2024 23:06   CT ABDOMEN PELVIS W CONTRAST Result Date: 01/25/2024 CLINICAL DATA:  Pain nausea reduced ostomy output EXAM: CT ABDOMEN AND PELVIS WITH CONTRAST TECHNIQUE: Multidetector CT imaging of the abdomen and pelvis was performed using the standard  protocol following bolus administration of intravenous contrast. RADIATION DOSE REDUCTION: This exam was performed according to the departmental dose-optimization program which includes automated exposure control, adjustment of the mA and/or kV according to patient size and/or use of iterative reconstruction technique.  CONTRAST:  OMNIPAQUE  IOHEXOL  300 MG/ML  SOLN COMPARISON:  CT 12/30/2023, 12/18/2023 FINDINGS: Lower chest: Lung bases demonstrate atelectasis or scarring at the bases. Cardiomegaly. Moderate pericardial effusion, increased compared to prior, measures up to 19 mm maximum thickness along the right cardiac border, and exhibits mild diffuse rim enhancement. Slight increased density values within the fluid. Hepatobiliary: Gallstone. No focal hepatic abnormality or biliary dilatation Pancreas: Unremarkable. No pancreatic ductal dilatation or surrounding inflammatory changes. Spleen: Normal in size without focal abnormality. Adrenals/Urinary Tract: Adrenal glands are normal. Atrophic kidneys without hydronephrosis. Left percutaneous nephrostomy catheter with decreased collecting system. Renal cysts for which no imaging follow-up is recommended. Thick-walled collapsed appearance of the urinary bladder with mild perivesical stranding. Stomach/Bowel: Stomach is moderately distended with fluid. Interim development of multiple dilated fluid-filled loops of proximal to mid small bowel, measuring up to 4.4 cm. Fecalized small bowel in the pelvis. Adhesed appearance of small-bowel loops to the anterior abdominal wall below the level of umbilicus. Decompressed small bowel in the region and remaining distal small bowel is decompressed. Patient is status post colectomy with right abdominal ileostomy. Small parastomal hernia contains fat and a knuckle of small bowel. Vascular/Lymphatic: Aortic atherosclerosis. No enlarged abdominal or pelvic lymph nodes. Reproductive: Slightly enlarged prostate Other: No free air. Left trans gluteal percutaneous drainage catheter since the prior exam with pigtail in the presacral region at the site of prior fluid collection. Residual soft tissue thickening in the region with small residual fluid inferior to the pigtail measuring about 14 x 16 mm on series 2, image 71. Musculoskeletal: No acute  osseous abnormality. 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 Dialysis graft in the left thigh and groin with similar small fluid collection in the left groin measuring 19 mm, previously 19 mm. Small fluid collection measuring 14 mm adjacent to the lateral/arterial limb of the graft in the thigh, stable since 2024. IMPRESSION: 1. Status post proctocolectomy with right abdominal ileostomy. Small parastomal hernia contains fat and a knuckle of small bowel. Interim development of moderate to marked fluid distension of the stomach with multiple dilated fluid-filled loops of proximal to mid small bowel and fecalized bowel in the pelvis consistent with small bowel obstruction. Transition point appears to be within the low anterior pelvis where there is adhesed appearance of multiple small bowel loops. 2. Left trans gluteal percutaneous drainage catheter with pigtail in the presacral region at the site of prior fluid collection. Residual soft tissue thickening in the region with small residual fluid collection inferior to the pigtail measuring about 14 x 16 mm. 3. Moderate pericardial effusion, increased compared to prior, exhibits mild diffuse rim enhancement. Slight increased density values within the fluid. Correlate for infection/pericarditis. 4. Gallstone. 5. Thick-walled collapsed appearance of the urinary bladder with mild perivesical stranding. 6. Left percutaneous nephrostomy catheter with no hydronephrosis. Atrophic native kidneys. Aortic Atherosclerosis (ICD10-I70.0). Electronically Signed   By: Luke Bun M.D.   On: 01/25/2024 20:40   DG Chest Port 1 View Result Date: 01/25/2024 CLINICAL DATA:  Concern for sepsis EXAM: PORTABLE CHEST 1 VIEW COMPARISON:  None Available. FINDINGS: 11/27/2023 stable enlarged cardiac silhouette. No effusion, infiltrate, pneumothorax. Mild scarring at LEFT lung base. RIGHT subclavian stent. IMPRESSION: 1. No acute cardiopulmonary process. 2.  Cardiomegaly.  Electronically Signed   By: Jackquline Boxer M.D.   On: 01/25/2024 19:36    Microbiology: Results for orders placed or performed during the hospital encounter of 01/25/24  Culture, blood (Routine x 2)     Status: None   Collection Time: 01/25/24  7:07 PM   Specimen: Right Antecubital; Blood  Result Value Ref Range Status   Specimen Description   Final    RIGHT ANTECUBITAL Performed at [facility], [address] [place], [place] [address]    Special Requests   Final    BOTTLES DRAWN AEROBIC AND ANAEROBIC Blood Culture results may not be optimal due to an inadequate volume of blood received in culture bottles Performed at [facility], [address] [place], [place] [address]    Culture   Final    NO GROWTH 7 DAYS Performed at [facility], [address], [place], [place] [address]    Report Status 02/01/2024 FINAL  Final  Culture, blood (Routine x 2)     Status: None   Collection Time: 01/25/24  7:09 PM   Specimen: Left Antecubital; Blood  Result Value Ref Range Status   Specimen Description   Final    LEFT ANTECUBITAL Performed at [facility], [address] [place], [place] [address]    Special Requests   Final    BOTTLES DRAWN AEROBIC AND ANAEROBIC Blood Culture adequate volume Performed at [facility], [address] [place], [place] [address]    Culture   Final    NO GROWTH 7 DAYS Performed at [facility], [address], [place], [place] [address]    Report Status 02/01/2024 FINAL  Final  Resp panel by RT-PCR (RSV, Flu A&B, Covid) Anterior Nasal Swab     Status: None   Collection Time: 01/25/24  9:01 PM   Specimen: Anterior Nasal Swab  Result Value Ref Range Status   SARS Coronavirus 2 by RT PCR NEGATIVE NEGATIVE Final    Comment: (NOTE) SARS-CoV-2 target nucleic acids are NOT DETECTED.  The SARS-CoV-2 RNA is generally detectable in upper respiratory specimens  during the acute phase of infection. The lowest concentration of SARS-CoV-2 viral copies this assay can detect is 138 copies/mL. A negative result does not preclude SARS-Cov-2 infection and should not be used as the sole basis for treatment or other patient management decisions. A negative result may occur with  improper specimen collection/handling, submission of specimen other than nasopharyngeal swab, presence of viral mutation(s) within the areas targeted by this assay, and inadequate number of viral copies(<138 copies/mL). A negative result must be combined with clinical observations, patient history, and epidemiological information. The expected result is Negative.  Fact Sheet for Patients:  BloggerCourse.com  Fact Sheet for Healthcare Providers:  SeriousBroker.it  This test is no t yet approved or cleared by the [place]  FDA and  has been authorized for detection and/or diagnosis of SARS-CoV-2 by FDA under an Emergency Use Authorization (EUA). This EUA will remain  in effect (meaning this test can be used) for the duration of the COVID-19 declaration under Section 564(b)(1) of the Act, 21 U.S.C.section 360bbb-3(b)(1), unless the authorization is terminated  or revoked sooner.       Influenza A by PCR NEGATIVE NEGATIVE Final   Influenza B by PCR NEGATIVE NEGATIVE Final    Comment: (NOTE) The Xpert Xpress SARS-CoV-2/FLU/RSV plus assay is intended as an aid in the diagnosis of influenza from Nasopharyngeal swab specimens and  should not be used as a sole basis for treatment. Nasal washings and aspirates are unacceptable for Xpert Xpress SARS-CoV-2/FLU/RSV testing.  Fact Sheet for Patients: BloggerCourse.com  Fact Sheet for Healthcare Providers: SeriousBroker.it  This test is not yet approved or cleared by the [place]  FDA and has been authorized for detection  and/or diagnosis of SARS-CoV-2 by FDA under an Emergency Use Authorization (EUA). This EUA will remain in effect (meaning this test can be used) for the duration of the COVID-19 declaration under Section 564(b)(1) of the Act, 21 U.S.C. section 360bbb-3(b)(1), unless the authorization is terminated or revoked.     Resp Syncytial Virus by PCR NEGATIVE NEGATIVE Final    Comment: (NOTE) Fact Sheet for Patients: BloggerCourse.com  Fact Sheet for Healthcare Providers: SeriousBroker.it  This test is not yet approved or cleared by the [place]  FDA and has been authorized for detection and/or diagnosis of SARS-CoV-2 by FDA under an Emergency Use Authorization (EUA). This EUA will remain in effect (meaning this test can be used) for the duration of the COVID-19 declaration under Section 564(b)(1) of the Act, 21 U.S.C. section 360bbb-3(b)(1), unless the authorization is terminated or revoked.  Performed at [facility], [address] [place], [place] [address]   MRSA Next Gen by PCR, Nasal     Status: None   Collection Time: 01/26/24 10:09 PM   Specimen: Nasal Mucosa; Nasal Swab  Result Value Ref Range Status   MRSA by PCR Next Gen NOT DETECTED NOT DETECTED Final    Comment: (NOTE) The GeneXpert MRSA Assay (FDA approved for NASAL specimens only), is one component of a comprehensive MRSA colonization surveillance program. It is not intended to diagnose MRSA infection nor to guide or monitor treatment for MRSA infections. Test performance is not FDA approved in patients less than 21 years old. Performed at [facility], [address], [place], [place] [address]     Labs: CBC: Recent Labs  Lab 02/22/24 0804 02/22/24 0854 02/23/24 0526 02/24/24 0423  WBC 6.2  --  4.1 5.3  NEUTROABS 5.3  --   --   --   HGB 10.1* 11.6* 10.2* 10.2*  HCT 34.8* 34.0* 33.1* 32.4*  MCV 127.5*  --  119.5* 116.5*  PLT  116*  --  124* 126*   Basic Metabolic Panel: Recent Labs  Lab 02/22/24 0804 02/22/24 0854 02/23/24 0526 02/24/24 1502  NA 137 133* 135 132*  K 4.8 4.5 6.0* 4.8  CL 102 105 102 94*  CO2 17*  --  21* 23  GLUCOSE 153* 149* 125* 184*  BUN 68* 61* 77* 54*  CREATININE 10.32* 11.00* 11.67* 7.88*  CALCIUM  8.7*  --  8.7* 8.4*   Liver Function Tests: Recent Labs  Lab 02/22/24 0804 02/23/24 0526  AST 21 14*  ALT 24 21  ALKPHOS 85 86  BILITOT 0.8 0.9  PROT 6.5 6.3*  ALBUMIN  3.3* 3.4*   CBG: No results for input(s): GLUCAP in the last 168 hours.  Discharge time spent:  35 minutes.  Signed: Drue ONEIDA Potter, MD [facility] 02/24/2024

## 2024-02-24 NOTE — TOC Transition Note (Signed)
 Transition of Care [facility]) - Discharge Note   Patient Details  Name: Joshua Riley MRN: [id number] Date of Birth: 1936/01/14  Transition [facility] Care [facility]) CM/SW Contact:  Lendia Dais, LCSWA Phone Number: 02/24/2024, 1:05 PM   Clinical Narrative:  Pt has no ICM needs and will discharge home and continue with [facility] care services.    Final next level of care: Home w Home Health Services Barriers to Discharge: Barriers Resolved   Patient Goals and CMS Choice Patient states their goals for this hospitalization and ongoing recovery are:: to follow diet   Choice offered to / list presented to : NA      Discharge Placement                Patient to be transferred to facility by: Daughter Name of family member notified: [name] Patient and family notified of of transfer: 02/24/24  Discharge Plan and Services Additional resources added to the After Visit Summary for   In-house Referral: Clinical Social Work                                   Social Drivers of Health (SDOH) Interventions SDOH Screenings   Food Insecurity: No Food Insecurity (02/22/2024)  Housing: Low Risk  (02/22/2024)  Transportation Needs: No Transportation Needs (02/22/2024)  Utilities: Not At Risk (02/22/2024)  Alcohol Screen: Low Risk  (10/26/2023)  Depression (PHQ2-9): Low Risk  (02/11/2024)  Financial Resource Strain: Low Risk  (10/26/2023)  Physical Activity: Insufficiently Active (10/26/2023)  Social Connections: Patient Declined (02/22/2024)  Recent Concern: Social Connections - Socially Isolated (01/27/2024)  Stress: Stress Concern Present (10/26/2023)  Tobacco Use: Medium Risk (02/22/2024)     Readmission Risk Interventions    12/03/2023   11:30 AM 11/10/2023    3:46 PM 07/21/2023   12:26 PM  Readmission Risk Prevention Plan  Transportation Screening Complete Complete Complete  PCP or Specialist Appt within 5-7 Days  Complete   Home Care Screening  Complete   Medication Review (RN CM)   Complete   HRI or Home Care Consult   Complete  Social Work Consult for Recovery Care Planning/Counseling   Complete  Palliative Care Screening   Not Applicable  Medication Review Oceanographer)   Complete  SW Recovery Care/Counseling Consult Complete    Palliative Care Screening Complete    Skilled Nursing Facility Complete

## 2024-02-24 NOTE — Progress Notes (Signed)
 DISCHARGE NOTE HOME Joshua Riley to be discharged Home per MD order. Discussed prescriptions and follow up appointments with the patient. Prescriptions given to patient; medication list explained in detail. Patient verbalized understanding.  Skin clean, dry and intact without evidence of skin break down, no evidence of skin tears noted. IV catheter discontinued intact. Site without signs and symptoms of complications. Dressing and pressure applied. Pt denies pain at the site currently. No complaints noted.  An After Visit Summary (AVS) was printed and given to the patient. Patient escorted via wheelchair, and discharged home via private auto.  Daekwon Beswick A Proctor-Gann, RN

## 2024-02-24 NOTE — TOC Initial Note (Addendum)
 Transition of Care [facility]) - Initial/Assessment Note    Patient Details  Name: Joshua Riley MRN: [id number] Date of Birth: 1936/02/13  Transition [facility]) CM/SW Contact:    Lendia Dais, LCSWA Phone Number: 02/24/2024, 1:11 PM  Clinical Narrative:  Pt is from home alone w/ [facility] nurse and aide from 12pm-4pm, 5x a week. Pt state that he did not remember the name of the [facility] agency. Pt uses a wheelchair and walker. Pt states that they are independent with ADL's and gave consent to contacting their daughter [name].   CSW spoke to [place] and the daughter confirmed that the [facility] agency is [facility].  Pt states that they are able to afford their basic needs such as housing, food, and transportation. Pt states that they are able to drive and will take [place] transportation to dialysis.  No ICM needs at this time. CSW will monitor for dc.           Expected Discharge Plan: Home/Self Care Barriers to Discharge: Barriers Resolved   Patient Goals and CMS Choice Patient states their goals for this hospitalization and ongoing recovery are:: to follow diet   Choice offered to / list presented to : NA      Expected Discharge Plan and Services In-house Referral: Clinical Social Work     Living arrangements for the past 2 months: Apartment                                      Prior Living Arrangements/Services Living arrangements for the past 2 months: Apartment Lives with:: Self Patient language and need for interpreter reviewed:: Yes Do you feel safe going back to the place where you live?: Yes      Need for Family Participation in Patient Care: No (Comment) Care giver support system in place?: No (comment) Current home services: DME, Homehealth aide, Home RN [facility] care services) Criminal Activity/Legal Involvement Pertinent to Current Situation/Hospitalization: No - Comment as needed  Activities of Daily Living   ADL Screening (condition at time of  admission) Independently performs ADLs?: Yes (appropriate for developmental age) Is the patient deaf or have difficulty hearing?: Yes Does the patient have difficulty seeing, even when wearing glasses/contacts?: No Does the patient have difficulty concentrating, remembering, or making decisions?: No  Permission Sought/Granted Permission sought to share information with : Family Supports Permission granted to share information with : Yes, Verbal Permission Granted  Share Information with NAME: [name] Brooke     Permission granted to share info w Relationship: Daughter  Permission granted to share info w Contact Information: [phone]  Emotional Assessment Appearance:: Appears stated age Attitude/Demeanor/Rapport: Engaged Affect (typically observed): Appropriate, Pleasant Orientation: : Oriented to Self, Oriented to Place, Oriented to  Time, Oriented to Situation Alcohol / Substance Use: Not Applicable Psych Involvement: No (comment)  Admission diagnosis:  ESRD (end stage renal disease) (HCC) [N18.6] Dialysis complication [T82.9XXA] Clotted dialysis access, initial encounter [facility]) [T82.49XA] Patient Active Problem List   Diagnosis Date Noted   Dialysis complication 02/22/2024   Presacral Abscess 02/22/2024   History of small bowel obstruction 02/11/2024   Adrenal insufficiency (HCC) 02/11/2024   Asthma 02/11/2024   Small bowel obstruction (HCC) 01/25/2024   Cerebrovascular disease 12/02/2023   Seizure like activity 11/28/2023   Acute pericarditis 11/11/2023   Abscess of abdominal cavity (HCC) 07/17/2023   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   Anastomotic  leak of intestine 07/14/2023   History of anemia due to chronic kidney disease 07/08/2023   Abscess of groin, left 07/07/2023   Macrocytic anemia 04/08/2023   Depression 04/08/2023   Abnormal bladder CT 03/23/2023   Infection due to ESBL-producing Escherichia coli 03/23/2023   DOE (dyspnea on exertion) 07/22/2022   Coag  negative Staphylococcus bacteremia 06/06/2022   History of sepsis 06/02/2022   Ulcerative colitis (HCC) 06/02/2022   End-stage renal disease on hemodialysis (HCC) 05/06/2022   SBO (small bowel obstruction) (HCC) 11/18/2021   Glaucoma 11/18/2021   Paroxysmal atrial fibrillation (HCC) 11/18/2021   Orthostatic hypotension 10/22/2021   Idiopathic neuropathy 10/22/2021   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 [place] [address] Phone: [phone] Fax: [phone]  [name] [address] [place] [address] Phone: [phone] Fax: [phone]     Social Drivers of Health (SDOH) Social History: SDOH Screenings   Food Insecurity: No Food Insecurity (02/22/2024)  Housing: Low Risk  (02/22/2024)  Transportation Needs: No Transportation Needs (02/22/2024)  Utilities: Not At Risk (02/22/2024)  Alcohol Screen: Low Risk  (10/26/2023)  Depression (PHQ2-9): Low Risk  (02/11/2024)  Financial Resource Strain: Low Risk  (10/26/2023)  Physical Activity: Insufficiently Active (10/26/2023)  Social Connections: Patient Declined (02/22/2024)  Recent Concern: Social Connections - Socially Isolated (01/27/2024)  Stress: Stress Concern Present (10/26/2023)  Tobacco Use: Medium Risk (02/22/2024)   SDOH Interventions:     Readmission Risk Interventions    12/03/2023   11:30 AM 11/10/2023    3:46 PM 07/21/2023   12:26 PM  Readmission Risk Prevention Plan  Transportation Screening Complete Complete Complete  PCP or Specialist Appt within 5-7 Days  Complete   Home Care Screening  Complete   Medication Review (RN CM)  Complete   HRI or Home Care Consult   Complete  Social Work Consult for Recovery Care Planning/Counseling   Complete  Palliative Care Screening   Not Applicable  Medication Review Furniture conservator/restorer)   Complete  SW Recovery Care/Counseling Consult Complete    Palliative Care Screening Complete    Skilled Nursing Facility Complete

## 2024-02-24 NOTE — Progress Notes (Signed)
 PHARMACY - ANTICOAGULATION CONSULT NOTE  Pharmacy Consult for heparin  Indication: atrial fibrillation  Allergies  Allergen Reactions   Baclofen Other (See Comments)    Altered mental status, after accidental overdose     Cephalosporins Rash   Ciprofloxacin Itching and Rash   Wound Dressing Adhesive Rash    Patient Measurements: Height: 5' 8 (172.7 cm) Weight: 72 kg (158 lb 11.7 oz) IBW/kg (Calculated) : 68.4 HEPARIN  DW (KG): 72  Vital Signs: Temp: 98 F (36.7 C) (08/20 0243) Temp Source: Oral (08/19 2315) BP: 83/60 (08/20 0243) Pulse Rate: 99 (08/20 0243)  Labs: Recent Labs    02/22/24 0804 02/22/24 0854 02/22/24 2009 02/23/24 0526 02/24/24 0423  HGB 10.1* 11.6*  --  10.2* 10.2*  HCT 34.8* 34.0*  --  33.1* 32.4*  PLT 116*  --   --  124* 126*  APTT  --   --  39* 124* 74*  LABPROT  --   --   --  15.6*  --   INR  --   --   --  1.2  --   HEPARINUNFRC  --   --  0.49 0.79* 0.41  CREATININE 10.32* 11.00*  --  11.67*  --     Estimated Creatinine Clearance: 4.5 mL/min (A) (by C-G formula based on SCr of 11.67 mg/dL (H)).   Medical History: Past Medical History:  Diagnosis Date   A-fib (HCC)    Anemia    Arthritis    Cancer (HCC)    Basal cell   COVID-19    2021   Dysrhythmia    Afib-controlled on eliquis    ESRD (end stage renal disease) (HCC) 10/22/2021   Glaucoma 11/18/2021   History of DVT (deep vein thrombosis)    Hydronephrosis    managed wtih a PCN   Idiopathic neuropathy 10/22/2021   lyrica     Ileostomy in place [facility])    Obstructive uropathy    With chronic left nephrostomy   Old retinal detachment, total or subtotal    Orthostatic hypotension 10/22/2021   Sleep apnea    does not need a machine   Stroke (HCC)    Ulcerative colitis (HCC)    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 The patient is awaiting IR procedure and transitioning PO to IV  anticoagulation. Hgb 11.6, Plt 116, the patient is ESRD on HD. Pharmacy consulted to manage heparin  infusion. Patient previously therapeutic on 1450 units/hr ~ 1 month ago.   8/19 AM update: aPTT 124 seconds HL 0.79 No signs of bleeding or issues noted with gtt  PM: s/p successful declot of left thigh AV graft. Ok per IR to resume heparin  drip after (done ~18:00)  8/20 PM - HL 0.41, aPTT 74 sec is therapeutic and correlating.  CBC stable (Hgb 10.2, pltc 126).    Goal of Therapy:  Heparin  level 0.3-0.7 units/ml aPTT 66-102 Monitor platelets by anticoagulation protocol: Yes   Plan:  Continue heparin  IV 1300 units/hr Daily heparin  level, CBC (no further aPTT's warranted since now correlating) Continue to monitor H&H and platelets F/u for transition back to oral anticoagulant  Joshua Riley, PharmD Clinical Pharmacist 02/24/2024  5:37 AM

## 2024-02-24 NOTE — Progress Notes (Signed)
 Back on the floor post Dialysis

## 2024-02-24 NOTE — Procedures (Signed)
 Interventional Radiology Procedure Note  Procedure: CT LEFT TRANGLUTEAL PRESACRAL ABSCESS DRAIN    Complications: None  Estimated Blood Loss:  MIN  Findings: 10FR DRAIN 50CC PUS CX SENT     EMERSON FREDERIC SPECKING, MD

## 2024-02-24 NOTE — Discharge Planning (Signed)
 [facility] Patient Discharge Orders - [facility] CLINIC: [place]  Patient's name: Joshua Riley Admit/DC Dates: 02/22/2024 - 02/24/24  DISCHARGE DIAGNOSES: Presacral drainage of presacral region with drain placement  Clotted AVG - underwent declot on 02/23/24 and his access is okay to use ESRD on HD.   HD ORDER CHANGES: Heparin  change: no heparin  EDW Change: no  Bath Change: no change  ANEMIA MANAGEMENT: Aranesp : Given: no    ESA dose for discharge: mircera 200 mcg due at next HD IV Iron dose at discharge: per protocol Transfusion: Given: no  BONE/MINERAL MEDICATIONS: Hectorol/Calcitriol  change: per protocol Sensipar/Parsabiv change: per protocol  ACCESS INTERVENTION/CHANGE: no - he underwent declot while in the hospital with IR and his thigh access is okay to be used    RECENT LABS: Recent Labs  Lab 02/23/24 0526 02/24/24 0423  HGB 10.2* 10.2*  NA 135  --   K 6.0*  --   CALCIUM  8.7*  --   ALBUMIN  3.4*  --     IV ANTIBIOTICS: no Details:   OTHER/APPTS/LAB ORDERS: Please draw updated labs at his next HD session.   D/C Meds to be reconciled by nurse after every discharge.  Completed By: Belvie Och, NP   Reviewed by: MD:______ RN_______

## 2024-02-24 NOTE — Progress Notes (Signed)
  [facility] Progress Note   Subjective: Seen in room. Had abscess drained today with no complication. Had HD overnight and they successfully removed 500 mL via UF. Next HD 02/25/24  Objective Vitals:   02/24/24 1030 02/24/24 1035 02/24/24 1040 02/24/24 1118  BP: 105/78 106/67 103/73 91/79  Pulse: (!) 103 (!) 110 (!) 103 71  Resp: 14 12 11 16   Temp:      TempSrc:      SpO2: 97% 97% 96% 97%  Weight:      Height:         Additional Objective Labs: Basic Metabolic Panel: Recent Labs  Lab 02/22/24 0804 02/22/24 0854 02/23/24 0526  NA 137 133* 135  K 4.8 4.5 6.0*  CL 102 105 102  CO2 17*  --  21*  GLUCOSE 153* 149* 125*  BUN 68* 61* 77*  CREATININE 10.32* 11.00* 11.67*  CALCIUM  8.7*  --  8.7*   CBC: Recent Labs  Lab 02/22/24 0804 02/22/24 0854 02/23/24 0526 02/24/24 0423  WBC 6.2  --  4.1 5.3  NEUTROABS 5.3  --   --   --   HGB 10.1* 11.6* 10.2* 10.2*  HCT 34.8* 34.0* 33.1* 32.4*  MCV 127.5*  --  119.5* 116.5*  PLT 116*  --  124* 126*   Blood Culture    Component Value Date/Time   SDES  01/25/2024 1909    LEFT ANTECUBITAL Performed at [facility], [address] [place], [place] [address]    [facility]  01/25/2024 1909    BOTTLES DRAWN AEROBIC AND ANAEROBIC Blood Culture adequate volume Performed at [facility], [address] [place], [place] [address]    CULT  01/25/2024 1909    NO GROWTH 7 DAYS Performed at [facility], [address], [place], [place] [address]    REPTSTATUS 02/01/2024 FINAL 01/25/2024 1909     Physical Exam General: Alert, nad Heart: RRR Lungs: Clear, normal wob Abdomen: non tender Extremities: 1+ bilat LE edema  Dialysis Access: L thigh AVG clotted   Medications:  heparin  Stopped (02/24/24 0901)    Chlorhexidine  Gluconate Cloth  6 each Topical Q0600   hydrocortisone  sod succinate (SOLU-CORTEF ) inj  100 mg Intravenous BID   midodrine   30 mg Oral TID    multivitamin  1 tablet Oral Daily   pantoprazole   40 mg Oral BID   sertraline   25 mg Oral QHS   sevelamer  carbonate  1,600 mg Oral TID WC   sodium chloride  flush  5 mL Intracatheter Q8H    Dialysis Orders:  [facility] MWF 2:45 350/1.5x EDW 72 kg  2/2.5 AVG No heparin   -Mircera 200 q 2 wks (Last 8/4) -Calcitriol  0.5 q HD   Assessment/Plan: Clotted L fem AVG: IR consulted for declot AVG +/- other needs on 8/19.  ESRD: HD MWF. Had HD last wk on Monday and Thursday. HD 02/25/24 Hyperkalemia. Due to missed dialysis.  Resolved.  BP: chronic hypotension on midodrine  10 tid, cont.  Volume: chronic mild pretib edema, small gainer, usual UF 1.5L. UF 2 L w/ next HD.  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  MBD: Sevelamer  binder ordered. Resume VDRA if stays in hospital.  L percutaneous nephrostomy tube: f/b IR as outpt Recurrent presacral fluid collection drain: s/p IR abscess drainage 02/24/24 Adrenal insufficiency: taking hydrocortisone  20mg  am+ 10mg  pm    Belvie Och, NP Joshua Riley 02/24/2024,1:19 PM

## 2024-02-24 NOTE — Progress Notes (Signed)
 Mobility Specialist Progress Note:   02/24/24 1130  Mobility  Activity Ambulated with assistance  Level of Assistance Contact guard assist, steadying assist  Assistive Device Front wheel walker  Distance Ambulated (ft) 250 ft  Activity Response Tolerated well  Mobility Referral Yes  Mobility visit 1 Mobility  Mobility Specialist Start Time (ACUTE ONLY) 1130  Mobility Specialist Stop Time (ACUTE ONLY) 1145  Mobility Specialist Time Calculation (min) (ACUTE ONLY) 15 min   Pt agreeable to mobility session. Required only minG assist for safety during ambulation with RW. Pt with minor impulsivity throughout. Back in room with all needs met, RN present.   Therisa Rana Mobility Specialist Please contact via SecureChat or  Rehab office at [phone]

## 2024-02-25 ENCOUNTER — Telehealth: Admitting: *Deleted

## 2024-02-25 ENCOUNTER — Telehealth: Payer: Self-pay | Admitting: Nurse Practitioner

## 2024-02-25 ENCOUNTER — Telehealth: Payer: Self-pay | Admitting: *Deleted

## 2024-02-25 NOTE — Transitions of Care (Post Inpatient/ED Visit) (Signed)
   02/25/2024  Name: Joshua Riley MRN: [id number] DOB: 01-02-1936  Today's TOC FU Call Status: Today's TOC FU Call Status:: Unsuccessful Call (1st Attempt) Unsuccessful Call (1st Attempt) Date: 02/25/24  Attempted to reach the patient regarding the most recent Inpatient/ED visit.  Follow Up Plan: Additional outreach attempts will be made to reach the patient to complete the Transitions of Care (Post Inpatient/ED visit) call.   Mliss Creed [facility], BSN RN Care Manager/ Transition of Care [facility]/ [facility] [phone]

## 2024-02-25 NOTE — Telephone Encounter (Signed)
 Transition of Care - Initial Contact after Hospitalization  Date of discharge:   Date of contact: 02/25/24  Method: Phone Spoke to: Patient's daughter [name]  Patient contacted to discuss transition of care from recent inpatient hospitalization. Patient was admitted to [facility] from 02/22/24 to 02/24/24 discharge diagnosis of presacral drainage and clotted AVG  The discharge medication list was reviewed. Patient understands the changes and has no concerns.   Patient will return to his/her outpatient HD unit on: 02/26/24  No other concerns at this time.

## 2024-02-25 NOTE — Progress Notes (Signed)
 Late Note Entry- February 25, 2024  Pt was d/c late yesterday. Contacted [facility] this morning to be advised of pt's d/c date and that pt should resume care tomorrow.   Randine Mungo Renal Navigator [phone]

## 2024-02-26 ENCOUNTER — Ambulatory Visit: Payer: Self-pay | Admitting: Family Medicine

## 2024-02-26 ENCOUNTER — Telehealth: Payer: Self-pay | Admitting: *Deleted

## 2024-02-26 LAB — HEPATITIS B SURFACE ANTIBODY, QUANTITATIVE: Hep B S AB Quant (Post): 9.2 m[IU]/mL — ABNORMAL LOW

## 2024-02-26 NOTE — Transitions of Care (Post Inpatient/ED Visit) (Signed)
 02/26/2024  Name: Joshua Riley MRN: [id number] DOB: May 06, 1936  Today's TOC FU Call Status: Today's TOC FU Call Status:: Successful TOC FU Call Completed TOC FU Call Complete Date: 02/26/24 Patient's Name and Date of Birth confirmed.  Transition Care Management Follow-up Telephone Call Date of Discharge: 02/24/24 Discharge Facility: [name]) Type of Discharge: Inpatient Admission Primary Inpatient Discharge Diagnosis:: Dialysis complication How have you been since you were released from the hospital?: Better (eating, drinking well, ambulating with walker and cane, continues with dialysis) Any questions or concerns?: No  Items Reviewed: Did you receive and understand the discharge instructions provided?: Yes Medications obtained,verified, and reconciled?: No Medications Not Reviewed Reasons::  (unable to reconcile medications, patient's daughter provides oversight and is not available to talk and is not at patient's house) Any new allergies since your discharge?: No Dietary orders reviewed?: Yes Type of Diet Ordered:: renal, heart healthy Do you have support at home?: Yes People in Home [RPT]: alone (pt has aide 12-4 pm M-F)  Medications Reviewed Today: Medications Reviewed Today   Medications were not reviewed in this encounter     Home Care and Equipment/Supplies: Were Home Health Services Ordered?: Yes Name of Home Health Agency:: [facility] Has Agency set up a time to come to your home?: Yes First Home Health Visit Date: 02/26/24 Any new equipment or medical supplies ordered?: No  Functional Questionnaire: Do you need assistance with meal preparation?: No Do you need assistance with eating?: No Do you have difficulty maintaining continence: No (dialysis 3 x week) Do you need assistance with getting out of bed/getting out of a chair/moving?: Yes (walker , cane) Do you have difficulty managing or taking your medications?: Yes (daughter provides oversight, she is  not with pt at present)  Follow up appointments reviewed: PCP Follow-up appointment confirmed?: No (pt has appointment 10/2 with primary care provider, declines for RN CM to make sooner appointment) MD Provider Line [phone] Given: No Specialist Hospital Follow-up appointment confirmed?: Yes Date of Specialist follow-up appointment?: 03/01/24 Follow-Up Specialty Provider:: pulmonary   pt is to call and make appointments with IR and nephrology, states he has contact # Do you need transportation to your follow-up appointment?: No Do you understand care options if your condition(s) worsen?: Yes-patient verbalized understanding Unable to reconcile medications today, patient's daughter provides oversight, she is not with pt and unable to talk, she is at work   Goals Addressed             This Visit's Progress    VBCI Transitions of Care (TOC) Care Plan       Problems:  Recent Hospitalization for treatment of Dialysis complication/ vascular access clogged Spoke with pt,  pt lives alone, daughter comes in daily to check on patient, pt goes to dialysis 3 x week, [facility] home health will be working with pt,  pt is independent with care of ileostomy, has walker and cane, unable to complete medication reconciliation with pt as daughter provides oversight, she is not with pt and unable to speak with daughter, she is at work,  pt has hired Engineer, production M-F, 12-4 pm,  pt has presacral drain placed by IR, pt empties drain also with assistance of home health and aide. Pt reports he has contact number for interventional radiology and nephrologist, will call and schedule appointments  Goal:  Over the next 30 days, the patient will not experience hospital readmission  Interventions: Dialysis complication/ vascular access clogged Evaluation of current treatment plan related to dialysis complication/  vascular access clogged, self-management and patient's adherence to plan as established by  provider. Discussed plans with patient for ongoing care management follow up and provided patient with direct contact information for care management team Reviewed all upcoming scheduled appointments Reviewed importance of reporting issues with vascular access immediately, importance of not missing any dialysis sessions Reviewed reportable signs/ symptoms and any issues with ileostomy Reviewed safety precautions, importance of using walker Reviewed signs /symptoms of infection with sacral drain and report to IR  Reviewed signs/ symptoms of infection  Patient Self Care Activities:  Attend all scheduled provider appointments Attend church or other social activities Call pharmacy for medication refills 3-7 days in advance of running out of medications Call provider office for new concerns or questions  Notify RN Care Manager of TOC call rescheduling needs Participate in Transition of Care Program/Attend TOC scheduled calls Take medications as prescribed   Report any issues with sacral drain immediately to Ozell Specking with interventional radiology Report any issues with dialysis vascular access, do not miss any dialysis sessions Follow renal diet/ fluid restrictions Fall prevention strategies: change positions slowly, use a walker or cane (per provider recommendations) when walking, keep walkways clear, have good lighting in room. It is important to contact your provider if you have any falls. Maintain muscle strength/tone by exercise per provider recommendations.   Plan:  Telephone outreach - 03/01/24 @ 1230 pm        Mliss Creed [facility], BSN RN Care Manager/ Transition of Care [place]/ [facility] [phone]

## 2024-02-29 ENCOUNTER — Ambulatory Visit: Admitting: Family Medicine

## 2024-02-29 ENCOUNTER — Other Ambulatory Visit: Payer: Self-pay | Admitting: General Surgery

## 2024-02-29 DIAGNOSIS — K611 Rectal abscess: Secondary | ICD-10-CM

## 2024-02-29 LAB — AEROBIC/ANAEROBIC CULTURE W GRAM STAIN (SURGICAL/DEEP WOUND)

## 2024-03-01 ENCOUNTER — Encounter: Payer: Self-pay | Admitting: Internal Medicine

## 2024-03-01 ENCOUNTER — Encounter: Payer: Self-pay | Admitting: *Deleted

## 2024-03-01 ENCOUNTER — Telehealth: Payer: Self-pay | Admitting: *Deleted

## 2024-03-01 ENCOUNTER — Ambulatory Visit (INDEPENDENT_AMBULATORY_CARE_PROVIDER_SITE_OTHER): Admitting: Internal Medicine

## 2024-03-01 VITALS — BP 94/60 | HR 90 | Ht 68.0 in | Wt 156.8 lb

## 2024-03-01 DIAGNOSIS — J4540 Moderate persistent asthma, uncomplicated: Secondary | ICD-10-CM | POA: Diagnosis not present

## 2024-03-01 DIAGNOSIS — R911 Solitary pulmonary nodule: Secondary | ICD-10-CM

## 2024-03-01 DIAGNOSIS — R5381 Other malaise: Secondary | ICD-10-CM

## 2024-03-01 NOTE — Patient Instructions (Addendum)
 -  ASTHMA:    - well controlled  Plan  - continue dulera  scheduled - continue albutgerol as needed   -RIGHT LOWER LOBE LUNG NODULE: CT chest FEb 2025 - 7mm   -  A lung nodule is a small growth in the lung. Your 7 mm nodule has not changed in size,   Plan  - August 2026 CT chest  -END-STAGE RENAL DISEASE ON DIALYSI - Anemia  - Fatigue - Physical Deconditioning  Plan  - increase home support to 8h a day  - do home PT  - HD per renal  Follow-up 6 months with APP

## 2024-03-01 NOTE — Progress Notes (Signed)
 OV 04/14/2023  Subjective:  Patient ID: Joshua Riley, male , DOB: Dec 29, 1935 , age 88 y.o. , MRN: [id number] , ADDRESS: [address] [place] [address] PCP Joshua Garnette KIDD, MD Patient Care Team: Joshua Garnette KIDD, MD as PCP - General (Family Medicine) Joshua Candyce RAMAN, MD as PCP - Cardiology (Cardiology) Center, [facility]  This Provider for this visit: Treatment Team:  Attending Provider: Geronimo Amel, MD    04/14/2023 -   Chief Complaint  Patient presents with   Consult    Pt started experiencing sob after having covid 2 years ago. No inhaler usage. No oxygen      HPI Joshua Riley 88 y.o. -presents with his daughter Joshua Riley.  She is an independent historian here today.  She works at the friend salon on [employer].  She has lived in [place] for 30 years.  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  And then 2 years ago his significant other Joshua Riley passed away and then he relocated to [place] to live next to his daughter.  He has a history of colostomy since 2012.  He has a history of nephrostomy tubes somewhere along the way.  He has been on dialysis for the last 2 years.  He makes some amount of urine.  He tells me that he had COVID 2 or 3 years ago and was hospitalized in [facility] was on oxygen.  And then he got better but now for the last 4 months he has had shortness of breath on exertion relieved by rest.  He has to walk slowly at [facility].  When he takes a shower he gets winded but not for changing clothes.  Sometimes getting out of the car makes him short of breath.  He does not think he can do 1 flight of stairs.  There is no chest pain no cough or wheezing.  Review of the labs indicate anemia  Echocardiogram #2023 grade 1 diastolic dysfunction  CT  abdomen lung image May 2023: Left lower lobe atelectasis personally visualized.  And agree with final report  Chest x-ray 04/10/2023: Personally visualized official report is pending.  Looks clear to me.  Walking desaturation test 04/14/2023; 200 feet in office -> did not desaturate    06/09/2023 Patient presents today review recent HRCT and pulmonary function results.  Discussed the use of AI scribe software for clinical note transcription with the patient, who gave verbal consent to proceed.  History of Present Illness   The patient, with a history of COVID-19 infection, kidney failure, and dialysis, presents with worsening shortness of breath. The dyspnea, initially intermittent, has become constant and is exacerbated by any exertion, including activities of daily living. The patient denies associated symptoms such as cough, wheezing, or chest tightness.  The patient was previously hospitalized for COVID-19 in [facility] and required oxygen therapy. A recent walk test indicated no need for supplemental oxygen. A CT scan showed mild changes in the lungs, including parenchymal changes and scarring, likely due to the previous COVID-19 infection. The patient's lung function was found to be normal on a breathing test, with some evidence of reversibility after bronchodilator administration, suggesting possible reactive airway disease or asthma-like symptoms.  The patient is not currently on any inhalers. He also reports sleep disturbances, but  denies experiencing breathing problems during sleep. The patient's activity level has decreased due to the dyspnea, and he is unable to engage in exercise regimens such as treadmill use or leg cycling.  The patient's cardiac health appears stable, with a cardiac catheterization in [place] showing no significant plaque buildup and normal heart function. An echocardiogram conducted a year ago also showed normal results.     10/27/2023- Interim hx   Discussed the use of AI scribe software for clinical note transcription with the patient, who gave verbal consent to proceed.  History of Present Illness   Joshua Riley is an 88 year old male with reactive airway disease who presents with worsening shortness of breath.  He experiences worsening shortness of breath, particularly with exertion, such as when getting up quickly or walking short distances, often necessitating rest to catch his breath. He uses Dulera  inhaler, prescribed in March, taking two puffs in the morning and two in the evening, though inconsistently due to perceived ineffectiveness. No current cough, chest tightness, or chest pain. No swelling in his legs.  He has a history of mild diastolic dysfunction diagnosed via echocardiogram in November 2023. A heart catheterization in [place] 2024 showed no significant coronary artery disease and normal heart function.  A breathing test in December 2024 indicated mild restriction with significant improvement post-bronchodilator, suggestive of reactive airway disease/ asthma. A CT scan in February 2025 showed no interstitial lung disease but revealed a stable 7mm right lower lobe nodule and an ascending aortic aneurysm, both of which are being monitored.  He is on hemodialysis three times a week. His creatinine levels have varied between 6.9 and 9.5, with the most recent being 6.9   .   OV 03/01/2024  Subjective:  Patient ID: Joshua Riley, male , DOB: [birth year] , age 88 y.o. , MRN: [id number] , ADDRESS: [address] [place] [address] PCP Joshua Garnette KIDD, MD Patient Care Team: Joshua Garnette KIDD, MD as PCP - General (Family Medicine) Joshua Agent, MD as PCP - Cardiology (Cardiology) Center, [facility] Joshua Mliss LABOR, RN as [facility] Care Management  This Provider for this visit: Treatment Team:  Attending Provider: Geronimo Amel, MD    03/01/2024 -   Chief  Complaint  Patient presents with   Medical Management of Chronic Issues   Shortness of Breath    Breathing slowly worsening since his last visit.      HPI Joshua Riley 88 y.o. -presents for follow-up.   Issues - #Right lower lobe lung nodule February 2025 7 mm -#Asthma on Dulera    I personally saw him late last year and after that he see nurse practitioner.  He is here with his daughter Joshua Riley.  His asthma is under control on Dulera .  Acid that is not a big issue but his main complaint is shortness of breath on exertion class III the daughter Joshua Riley thinks it is fatigue.  Their daughter-in-law in [place]  darius is getting into medical school this year] feels it is fatigue.  I also agree with this.  He has anemia history of renal disease diastolic dysfunction.  He gets intermittent physical therapy and home support because he gets a lot of admissions.  Most recently admitted this month for pelvic abscess that required drain and also hemodialysis catheter closing off.  They try to get him back with physical therapy.  The daughter-in-law feels that 4 hours a day of home social support [he lives alone] is not enough.  He is  able to do his ADLs but gets fatigued easily.  I recommended getting more physical therapy and physical strengthening.     PFT     Latest Ref Rng & Units 06/09/2023    9:17 AM  PFT Results  FVC-Pre L 1.84   FVC-Predicted Pre % 60   FVC-Post L 2.44   FVC-Predicted Post % 80   Pre FEV1/FVC % % 100   Post FEV1/FCV % % 86   FEV1-Pre L 1.84   FEV1-Predicted Pre % 88   FEV1-Post L 2.10   DLCO uncorrected ml/min/mmHg 15.32   DLCO UNC% % 75   DLCO corrected ml/min/mmHg 17.46   DLCO COR %Predicted % 85   DLVA Predicted % 108   TLC L 4.49   TLC % Predicted % 74   RV % Predicted % 75        LAB RESULTS last 96 hours No results found.       has a past medical history of A-fib (HCC), Anemia, Arthritis, Cancer (HCC), COVID-19, Dysrhythmia, ESRD (end stage  renal disease) (HCC) (10/22/2021), Glaucoma (11/18/2021), History of DVT (deep vein thrombosis), Hydronephrosis, Idiopathic neuropathy (10/22/2021), Ileostomy in place [facility]), Obstructive uropathy, Old retinal detachment, total or subtotal, Orthostatic hypotension (10/22/2021), Sleep apnea, Stroke (HCC), Ulcerative colitis (HCC), and Ureteral stricture.   reports that he quit smoking about 40 years ago. His smoking use included cigarettes. He started smoking about 46 years ago. He has a 12 pack-year smoking history. He has never been exposed to tobacco smoke. He has never used smokeless tobacco.  Past Surgical History:  Procedure Laterality Date   BASAL CELL CARCINOMA EXCISION     10/23   COLON SURGERY     creation of j pouch     and subsequent takedown of j pouch   EYE SURGERY     IR CATHETER TUBE CHANGE  12/18/2023   IR CATHETER TUBE CHANGE  02/19/2024   IR CATHETER TUBE CHANGE  02/19/2024   IR NEPHROSTOMY EXCHANGE LEFT  12/10/2021   IR NEPHROSTOMY EXCHANGE LEFT  04/22/2022   IR NEPHROSTOMY EXCHANGE LEFT  07/29/2022   IR NEPHROSTOMY EXCHANGE LEFT  10/28/2022   IR NEPHROSTOMY EXCHANGE LEFT  02/05/2023   IR NEPHROSTOMY EXCHANGE LEFT  05/07/2023   IR NEPHROSTOMY EXCHANGE LEFT  06/25/2023   IR NEPHROSTOMY EXCHANGE LEFT  07/17/2023   IR NEPHROSTOMY EXCHANGE LEFT  10/22/2023   IR RADIOLOGIST EVAL & MGMT  11/25/2023   IR RADIOLOGIST EVAL & MGMT  12/18/2023   IR RADIOLOGIST EVAL & MGMT  02/19/2024   IR THROMBECTOMY AV FISTULA W/THROMBOLYSIS/PTA INC/SHUNT/IMG LEFT Left 02/23/2024   IR US  GUIDE VASC ACCESS LEFT  02/23/2024   LEFT HEART CATH AND CORONARY ANGIOGRAPHY N/A 07/22/2022   Procedure: LEFT HEART CATH AND CORONARY ANGIOGRAPHY;  Surgeon: Swaziland, Peter M, MD;  Location: [facility] INVASIVE CV LAB;  Service: Cardiovascular;  Laterality: N/A;   REVISION OF ARTERIOVENOUS GORETEX GRAFT Left 05/06/2022   Procedure: REDO LEFT THIGH ARTERIOVENOUS 4-7 MM GORETEX GRAFT;  Surgeon: Eliza Lonni RAMAN, MD;  Location:  [facility] OR;  Service: Vascular;  Laterality: Left;   SMALL INTESTINE SURGERY     TOTAL COLECTOMY      Allergies  Allergen Reactions   Baclofen Other (See Comments)    Altered mental status, after accidental overdose     Cephalosporins Rash   Ciprofloxacin Itching and Rash   Wound Dressing Adhesive Rash    Immunization History  Administered Date(s) Administered   INFLUENZA, HIGH DOSE SEASONAL  PF 04/20/2018   Influenza, Seasonal, Injecte, Preservative Fre 05/16/2014   Influenza-Unspecified 03/17/2012, 04/15/2013, 05/16/2014, 05/09/2015, 05/07/2016, 03/24/2017, 05/31/2017, 04/10/2019   PFIZER(Purple Top)SARS-COV-2 Vaccination 07/16/2019, 08/09/2019   Pneumococcal Conjugate-13 01/09/2016, 04/06/2016   Pneumococcal Polysaccharide-23 03/17/2012, 11/10/2017   Zoster, Live 04/18/2013    Family History  Problem Relation Age of Onset   Stroke Mother    Cancer Father    Esophageal cancer Brother      Current Outpatient Medications:    albuterol  (VENTOLIN  HFA) 108 (90 Base) MCG/ACT inhaler, Inhale 1-2 puffs into the lungs every 6 (six) hours as needed for wheezing or shortness of breath., Disp: 18 g, Rfl: 5   calcitRIOL  (ROCALTROL ) 0.5 MCG capsule, Take 1 capsule (0.5 mcg total) by mouth every Monday, Wednesday, and Friday with hemodialysis., Disp: , Rfl:    Cyanocobalamin (VITAMIN B-12 PO), Take 1 capsule by mouth in the morning and at bedtime., Disp: , Rfl:    dorzolamide  (TRUSOPT ) 2 % ophthalmic solution, Place 1 drop into the right eye at bedtime., Disp: , Rfl:    Doxylamine Succinate, Sleep, (SLEEP AID PO), Take 1 Dose by mouth at bedtime. Sleep aid of unknown name. OTC., Disp: , Rfl:    ELIQUIS  2.5 MG TABS tablet, Take 1 tablet (2.5 mg total) by mouth 2 (two) times daily., Disp: 60 tablet, Rfl: 11   fludrocortisone  (FLORINEF ) 0.1 MG tablet, Take 1 tablet (0.1 mg total) by mouth daily., Disp: , Rfl:    gabapentin  (NEURONTIN ) 300 MG capsule, Take 300 mg by mouth daily as needed (Pain).,  Disp: , Rfl:    hydrocortisone  (CORTEF ) 10 MG tablet, Take 2 tablets (20 mg total) by mouth daily and take 1 tablet (10 mg total) by mouth every evening., Disp: 90 tablet, Rfl: 1   midodrine  (PROAMATINE ) 10 MG tablet, Take 3 tablets (30 mg total) by mouth 3 (three) times daily., Disp: 270 tablet, Rfl: 1   mometasone -formoterol  (DULERA ) 200-5 MCG/ACT AERO, Inhale 2 puffs into the lungs 2 (two) times daily., Disp: 1 each, Rfl: 1   multivitamin (RENA-VIT) TABS tablet, Take 1 tablet by mouth daily., Disp: , Rfl:    Omega Fatty Acids-Vitamins (OMEGA-3 GUMMIES) CHEW, Chew 1 tablet by mouth daily., Disp: , Rfl:    pantoprazole  (PROTONIX ) 40 MG tablet, Take 1 tablet (40 mg total) by mouth 2 (two) times daily., Disp: 60 tablet, Rfl: 1   ramelteon  (ROZEREM ) 8 MG tablet, Take 1 tablet (8 mg total) by mouth at bedtime., Disp: 30 tablet, Rfl: 5   sertraline  (ZOLOFT ) 50 MG tablet, Take 25 mg by mouth at bedtime., Disp: , Rfl:    sevelamer  carbonate (RENVELA ) 800 MG tablet, Take 1,600 mg by mouth 3 (three) times daily with meals., Disp: , Rfl:    Sodium Chloride  Flush (SALINE FLUSH) 0.9 % SOLN, Use 5 mLs by Intracatheter route daily as directed., Disp: 150 mL, Rfl: 1   timolol  (TIMOPTIC ) 0.5 % ophthalmic solution, Place 1 drop into both eyes in the morning and at bedtime., Disp: , Rfl:    traMADol  (ULTRAM ) 50 MG tablet, Take 1 tablet (50 mg total) by mouth every 8 (eight) hours as needed (for pain)., Disp: 12 tablet, Rfl: 0   TYLENOL  325 MG tablet, Take 325-650 mg by mouth every 6 (six) hours as needed for mild pain (pain score 1-3) or headache., Disp: , Rfl:    albuterol  (PROVENTIL ) (2.5 MG/3ML) 0.083% nebulizer solution, Take 2.5 mg by nebulization every 6 (six) hours as needed for wheezing or shortness of  breath. (Patient not taking: Reported on 03/01/2024), Disp: , Rfl:    atorvastatin  (LIPITOR) 20 MG tablet, Take 1 tablet (20 mg total) by mouth daily. (Patient not taking: No sig reported), Disp: 90 tablet, Rfl:  0   budesonide  (PULMICORT ) 0.5 MG/2ML nebulizer solution, Take 0.5 mg by nebulization 2 (two) times daily as needed (for respiratory flares). (Patient not taking: Reported on 03/01/2024), Disp: , Rfl:    folic acid  (FOLVITE ) 1 MG tablet, Take 1 tablet (1 mg total) by mouth daily. (Patient not taking: Reported on 03/01/2024), Disp: 30 tablet, Rfl: 0      Objective:   Vitals:   03/01/24 0830  BP: 94/60  Pulse: 90  SpO2: 96%  Weight: 156 lb 12.8 oz (71.1 kg)  Height: 5' 8 (1.727 m)    Estimated body mass index is 23.84 kg/m as calculated from the following:   Height as of this encounter: 5' 8 (1.727 m).   Weight as of this encounter: 156 lb 12.8 oz (71.1 kg).  @WEIGHTCHANGE @  [employer]   03/01/24 0830  Weight: 156 lb 12.8 oz (71.1 kg)     Physical Exam   General: No distress. Looks chronic unwell, deconditioned O2 at rest: no Cane present: no Sitting in wheel chair: YES Frail: YES Obese: no Neuro: Alert and Oriented x 3. GCS 15. Speech normal Psych: Pleasant Resp:  Barrel Chest - no.  Wheeze - no, Crackles - no, No overt respiratory distress CVS: Normal heart sounds. Murmurs - no Ext: Stigmata of Connective Tissue Disease - no HEENT: Normal upper airway. PEERL +. No post nasal drip        Assessment/     Assessment & Plan Moderate persistent asthma without complication  Nodule of lower lobe of right lung  Physical deconditioning    PLAN Patient Instructions    -ASTHMA:    - well controlled  Plan  - continue dulera  scheduled - continue albutgerol as needed   -RIGHT LOWER LOBE LUNG NODULE: CT chest FEb 2025 - 7mm   -  A lung nodule is a small growth in the lung. Your 7 mm nodule has not changed in size,   Plan  - August 2026 CT chest  -END-STAGE RENAL DISEASE ON DIALYSI - Anemia  - Fatigue - Physical Deconditioning  Plan  - increase home support to 8h a day  - do home PT  - HD per renal  Follow-up 6 months with  APP    FOLLOWUP    Return in about 6 months (around 09/01/2024) for Face to Face Visit, with any of the APPS.    SIGNATURE    Dr. Dorethia Cave, M.D., F.C.C.P,  Pulmonary and Critical Care Medicine Staff Physician, [facility] Center Director - Interstitial Lung Disease  Program  Pulmonary Fibrosis [facility] at [facility] [place], [place], [address]  Pager: [phone], If no answer or between  15:00h - 7:00h: call [phone] Telephone: [phone]  8:56 AM 03/01/2024

## 2024-03-02 ENCOUNTER — Encounter: Payer: Self-pay | Admitting: *Deleted

## 2024-03-03 ENCOUNTER — Encounter: Payer: Self-pay | Admitting: *Deleted

## 2024-03-04 ENCOUNTER — Other Ambulatory Visit (HOSPITAL_COMMUNITY): Payer: Self-pay

## 2024-03-04 ENCOUNTER — Other Ambulatory Visit: Payer: Self-pay

## 2024-03-09 ENCOUNTER — Other Ambulatory Visit

## 2024-03-09 NOTE — Progress Notes (Incomplete)
 Referring Physician(s): Ramirez,Armando  Chief Complaint: The patient is seen in follow up today s/p presacral abscess drain placed 02/24/24   History of present illness:  Joshua Riley is an 88 y.o. male with a history of ESRD on hemodialysis, obstructive uropathy s/p left PCN placement, ulcerative colitis s/p colectomy with ileostomy, and chronic presacral fluid collection who is known to IR from previous PCN placement and transgluteal drain placements. Patient had undergone drain placements 07/2023 and 11/2023 with both drains becoming unintentionally displaced. At a clinic visit in 12/18/23 the drain continued to have substantial output and injection revealed fistulization. The drain was downsized from a 10 fr to an 8 Fr. He was scheduled to return for follow up a few weeks later, but his drain became dislodged during that period.    A CT scan 12/30/23 which showed a residual or reaccumulated fluid collection in the posterior pelvis/presacral region. He was referred back to IR and on 01/07/24 he received an 8 fr drain into this fluid collection.   He presented to the outpatient IR clinic 02/19/24 for a drain evaluation. CT imaging showed a residual presacral fluid collection with the pigtail catheter located just outside of the fluid.The patient also had a left PCN that had not been exchanged since October 22, 2023 and this was exchanged in the clinic.    A drain exchange of the presacral drain was attempted but unsuccessful and the drain was removed. The patient was scheduled for new drain placement as an outpatient but ended up going to the ED due to a malfunction of his left femoral AV graft. He was briefly hospitalized and while inpatient he was seen in IR for a declot of the graft (02/23/24) and placement of a new presacral abscess drain (02/24/24).  He was discharged from the hospital 02/24/24 and he presents to the IR outpatient clinic today for a drain evaluation.   Past Medical History:   Diagnosis Date   A-fib (HCC)    Anemia    Arthritis    Cancer (HCC)    Basal cell   COVID-19    2021   Dysrhythmia    Afib-controlled on eliquis    ESRD (end stage renal disease) (HCC) 10/22/2021   Glaucoma 11/18/2021   History of DVT (deep vein thrombosis)    Hydronephrosis    managed wtih a PCN   Idiopathic neuropathy 10/22/2021   lyrica     Ileostomy in place [facility])    Obstructive uropathy    With chronic left nephrostomy   Old retinal detachment, total or subtotal    Orthostatic hypotension 10/22/2021   Sleep apnea    does not need a machine   Stroke (HCC)    Ulcerative colitis (HCC)    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    Past Surgical History:  Procedure Laterality Date   BASAL CELL CARCINOMA EXCISION     10/23   COLON SURGERY     creation of j pouch     and subsequent takedown of j pouch   EYE SURGERY     IR CATHETER TUBE CHANGE  12/18/2023   IR CATHETER TUBE CHANGE  02/19/2024   IR CATHETER TUBE CHANGE  02/19/2024   IR NEPHROSTOMY EXCHANGE LEFT  12/10/2021   IR NEPHROSTOMY EXCHANGE LEFT  04/22/2022   IR NEPHROSTOMY EXCHANGE LEFT  07/29/2022   IR NEPHROSTOMY EXCHANGE LEFT  10/28/2022   IR NEPHROSTOMY EXCHANGE LEFT  02/05/2023   IR NEPHROSTOMY EXCHANGE LEFT  05/07/2023  IR NEPHROSTOMY EXCHANGE LEFT  06/25/2023   IR NEPHROSTOMY EXCHANGE LEFT  07/17/2023   IR NEPHROSTOMY EXCHANGE LEFT  10/22/2023   IR RADIOLOGIST EVAL & MGMT  11/25/2023   IR RADIOLOGIST EVAL & MGMT  12/18/2023   IR RADIOLOGIST EVAL & MGMT  02/19/2024   IR THROMBECTOMY AV FISTULA W/THROMBOLYSIS/PTA INC/SHUNT/IMG LEFT Left 02/23/2024   IR US  GUIDE VASC ACCESS LEFT  02/23/2024   LEFT HEART CATH AND CORONARY ANGIOGRAPHY N/A 07/22/2022   Procedure: LEFT HEART CATH AND CORONARY ANGIOGRAPHY;  Surgeon: Swaziland, Peter M, MD;  Location: [facility] INVASIVE CV LAB;  Service: Cardiovascular;  Laterality: N/A;   REVISION OF ARTERIOVENOUS GORETEX GRAFT Left 05/06/2022   Procedure: REDO LEFT THIGH  ARTERIOVENOUS 4-7 MM GORETEX GRAFT;  Surgeon: Eliza Lonni RAMAN, MD;  Location: [facility] OR;  Service: Vascular;  Laterality: Left;   SMALL INTESTINE SURGERY     TOTAL COLECTOMY      Allergies: Baclofen, Cephalosporins, Ciprofloxacin, and Wound dressing adhesive  Medications: Prior to Admission medications   Medication Sig Start Date End Date Taking? Authorizing Provider  albuterol  (PROVENTIL ) (2.5 MG/3ML) 0.083% nebulizer solution Take 2.5 mg by nebulization every 6 (six) hours as needed for wheezing or shortness of breath. Patient not taking: Reported on 03/01/2024    [provider]  albuterol  (VENTOLIN  HFA) 108 (90 Base) MCG/ACT inhaler Inhale 1-2 puffs into the lungs every 6 (six) hours as needed for wheezing or shortness of breath. 02/11/24   Katrinka Garnette KIDD, MD  atorvastatin  (LIPITOR) 20 MG tablet Take 1 tablet (20 mg total) by mouth daily. Patient not taking: No sig reported 11/14/23   Sebastian Toribio GAILS, MD  budesonide  (PULMICORT ) 0.5 MG/2ML nebulizer solution Take 0.5 mg by nebulization 2 (two) times daily as needed (for respiratory flares). Patient not taking: Reported on 03/01/2024    [provider]  calcitRIOL  (ROCALTROL ) 0.5 MCG capsule Take 1 capsule (0.5 mcg total) by mouth every Monday, Wednesday, and Friday with hemodialysis. 12/04/23   Danford, Lonni SQUIBB, MD  Cyanocobalamin (VITAMIN B-12 PO) Take 1 capsule by mouth in the morning and at bedtime.    [provider]  dorzolamide  (TRUSOPT ) 2 % ophthalmic solution Place 1 drop into the right eye at bedtime. 06/17/23   [provider]  Doxylamine Succinate, Sleep, (SLEEP AID PO) Take 1 Dose by mouth at bedtime. Sleep aid of unknown name. OTC.    [provider]  ELIQUIS  2.5 MG TABS tablet Take 1 tablet (2.5 mg total) by mouth 2 (two) times daily. 02/17/24   Katrinka Garnette KIDD, MD  fludrocortisone  (FLORINEF ) 0.1 MG tablet Take 1 tablet (0.1 mg total) by mouth daily. 12/04/23   Danford,  Lonni SQUIBB, MD  folic acid  (FOLVITE ) 1 MG tablet Take 1 tablet (1 mg total) by mouth daily. Patient not taking: Reported on 03/01/2024 07/21/23   Regalado, Owen A, MD  gabapentin  (NEURONTIN ) 300 MG capsule Take 300 mg by mouth daily as needed (Pain). 09/09/23   [provider]  hydrocortisone  (CORTEF ) 10 MG tablet Take 2 tablets (20 mg total) by mouth daily and take 1 tablet (10 mg total) by mouth every evening. 02/11/24   Katrinka Garnette KIDD, MD  midodrine  (PROAMATINE ) 10 MG tablet Take 3 tablets (30 mg total) by mouth 3 (three) times daily. 02/03/24   Hunsucker, Donnice SAUNDERS, MD  mometasone -formoterol  (DULERA ) 200-5 MCG/ACT AERO Inhale 2 puffs into the lungs 2 (two) times daily. 09/08/23   Rosario Leatrice FERNS, MD  multivitamin (RENA-VIT) TABS tablet Take  1 tablet by mouth daily.    [provider]  Omega Fatty Acids-Vitamins (OMEGA-3 GUMMIES) CHEW Chew 1 tablet by mouth daily.    [provider]  pantoprazole  (PROTONIX ) 40 MG tablet Take 1 tablet (40 mg total) by mouth 2 (two) times daily. 11/13/23   Sebastian Toribio GAILS, MD  ramelteon  (ROZEREM ) 8 MG tablet Take 1 tablet (8 mg total) by mouth at bedtime. 10/27/23   Katrinka Garnette KIDD, MD  sertraline  (ZOLOFT ) 50 MG tablet Take 25 mg by mouth at bedtime.    [provider]  sevelamer  carbonate (RENVELA ) 800 MG tablet Take 1,600 mg by mouth 3 (three) times daily with meals.    [provider]  Sodium Chloride  Flush (SALINE FLUSH) 0.9 % SOLN Use 5 mLs by Intracatheter route daily as directed. 11/25/23   Caperilla, Marissa N, PA  timolol  (TIMOPTIC ) 0.5 % ophthalmic solution Place 1 drop into both eyes in the morning and at bedtime.    [provider]  traMADol  (ULTRAM ) 50 MG tablet Take 1 tablet (50 mg total) by mouth every 8 (eight) hours as needed (for pain). 12/03/23   Danford, Lonni SQUIBB, MD  TYLENOL  325 MG tablet Take 325-650 mg by mouth every 6 (six) hours as needed for mild pain (pain score 1-3) or  headache.    [provider]     Family History  Problem Relation Age of Onset   Stroke Mother    Cancer Father    Esophageal cancer Brother     Social History   Socioeconomic History   Marital status: Widowed    Spouse name: Not on file   Number of children: Not on file   Years of education: Not on file   Highest education level: 12th grade  Occupational History   Not on file  Tobacco Use   Smoking status: Former    Current packs/day: 0.00    Average packs/day: 2.0 packs/day for 6.0 years (12.0 ttl pk-yrs)    Types: Cigarettes    Start date: 28    Quit date: 34    Years since quitting: 40.6    Passive exposure: Never   Smokeless tobacco: Never  Vaping Use   Vaping status: Never Used  Substance and Sexual Activity   Alcohol use: Yes    Alcohol/week: 5.0 standard drinks of alcohol    Types: 5 Shots of liquor per week    Comment: socially   Drug use: Never   Sexual activity: Not Currently  Other Topics Concern   Not on file  Social History Narrative   Widowed- lost wife of 13 years to pancreatic cancer- previously married 43 years. Son in [place] and daughter helping in [place]. 4 grandkids.       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       Hobbies: dinner with family- occasional martini   Social Drivers of Health   Financial Resource Strain: Low Risk  (10/26/2023)   Overall Financial Resource Strain (CARDIA)    Difficulty of Paying Living Expenses: Not hard at all  Food Insecurity: No Food Insecurity (02/26/2024)   Hunger Vital Sign    Worried About Running Out of Food in the Last Year: Never true    Ran Out of Food in the Last Year: Never true  Transportation Needs: No Transportation Needs (02/26/2024)   PRAPARE - Administrator, Civil Service (Medical): No    Lack of Transportation (Non-Medical): No  Physical  Activity: Insufficiently Active (10/26/2023)   Exercise Vital Sign    Days of Exercise per Week: 1 day     Minutes of Exercise per Session: 10 min  Stress: Stress Concern Present (10/26/2023)   [employer] of Occupational Health - Occupational Stress Questionnaire    Feeling of Stress : To some extent  Social Connections: Patient Declined (02/22/2024)   Social Connection and Isolation Panel    Frequency of Communication with Friends and Family: Patient declined    Frequency of Social Gatherings with Friends and Family: Patient declined    Attends Religious Services: Patient declined    Database administrator or Organizations: Patient declined    Attends Banker Meetings: Patient declined    Marital Status: Patient declined  Recent Concern: Social Connections - Socially Isolated (01/27/2024)   Social Connection and Isolation Panel    Frequency of Communication with Friends and Family: More than three times a week    Frequency of Social Gatherings with Friends and Family: More than three times a week    Attends Religious Services: Never    Database administrator or Organizations: No    Attends Banker Meetings: Never    Marital Status: Widowed     Vital Signs: There were no vitals taken for this visit.  Physical Exam  Imaging: No results found.  Labs:  CBC: Recent Labs    02/02/24 0700 02/22/24 0804 02/22/24 0854 02/23/24 0526 02/24/24 0423  WBC 8.6 6.2  --  4.1 5.3  HGB 9.1* 10.1* 11.6* 10.2* 10.2*  HCT 28.3* 34.8* 34.0* 33.1* 32.4*  PLT 171 116*  --  124* 126*    COAGS: Recent Labs    11/27/23 1412 01/07/24 0841 01/25/24 1854 01/27/24 0024 01/30/24 0236 02/22/24 2009 02/23/24 0526 02/24/24 0423  INR 1.1 1.3* 1.3*  --   --   --  1.2  --   APTT  --   --   --    < > 90* 39* 124* 74*   < > = values in this interval not displayed.    BMP: Recent Labs    02/02/24 0700 02/22/24 0804 02/22/24 0854 02/23/24 0526 02/24/24 1502  NA 123* 137 133* 135 132*  K 4.4 4.8 4.5 6.0* 4.8  CL 88* 102 105 102 94*  CO2 18* 17*  --  21* 23   GLUCOSE 132* 153* 149* 125* 184*  BUN 86* 68* 61* 77* 54*  CALCIUM  8.1* 8.7*  --  8.7* 8.4*  CREATININE 9.72* 10.32* 11.00* 11.67* 7.88*  GFRNONAA 5* 4*  --  4* 6*    LIVER FUNCTION TESTS: Recent Labs    01/25/24 1854 01/27/24 1013 02/02/24 0700 02/22/24 0804 02/23/24 0526  BILITOT 0.7 1.0  --  0.8 0.9  AST 28 19  --  21 14*  ALT 27 16  --  24 21  ALKPHOS 144* 83  --  85 86  PROT 7.9 6.4*  --  6.5 6.3*  ALBUMIN  4.4 3.1* 3.6 3.3* 3.4*    Assessment:  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 Terrill attestation of this note for management and plan.

## 2024-03-11 ENCOUNTER — Other Ambulatory Visit

## 2024-03-11 ENCOUNTER — Inpatient Hospital Stay: Admission: RE | Admit: 2024-03-11 | Source: Ambulatory Visit

## 2024-03-13 NOTE — Progress Notes (Incomplete)
 Referring Physician(s): Ramirez,Armando  Chief Complaint: The patient is seen in follow up today s/p presacral abscess drain placed 02/24/24  History of present illness:  Joshua Riley is an 88 y.o. male with a history of ESRD on hemodialysis, obstructive uropathy s/p left PCN placement, ulcerative colitis s/p colectomy with ileostomy, and chronic presacral fluid collection who is known to IR from previous PCN placement and transgluteal drain placements. Patient had undergone drain placements 07/2023 and 11/2023 with both drains becoming unintentionally displaced. At a clinic visit in 12/18/23 the drain continued to have substantial output and injection revealed fistulization. The drain was downsized from a 10 fr to an 8 Fr. He was scheduled to return for follow up a few weeks later, but his drain became dislodged during that period.    A CT scan 12/30/23 which showed a residual or reaccumulated fluid collection in the posterior pelvis/presacral region. He was referred back to IR and on 01/07/24 he received an 8 fr drain into this fluid collection.   He presented to the outpatient IR clinic 02/19/24 for a drain evaluation. CT imaging showed a residual presacral fluid collection with the pigtail catheter located just outside of the fluid.The patient also had a left PCN that had not been exchanged since October 22, 2023 and this was exchanged in the clinic.    A drain exchange of the presacral drain was attempted but unsuccessful and the drain was removed. The patient was scheduled for new drain placement as an outpatient but ended up going to the ED due to a malfunction of his left femoral AV graft. He was briefly hospitalized and while inpatient he was seen in IR for a declot of the graft (02/23/24) and placement of a new presacral abscess drain (02/24/24).  He was discharged from the hospital 02/24/24 and he presents to the IR outpatient clinic today for a drain evaluation.   Past Medical History:   Diagnosis Date   A-fib (HCC)    Anemia    Arthritis    Cancer (HCC)    Basal cell   COVID-19    2021   Dysrhythmia    Afib-controlled on eliquis    ESRD (end stage renal disease) (HCC) 10/22/2021   Glaucoma 11/18/2021   History of DVT (deep vein thrombosis)    Hydronephrosis    managed wtih a PCN   Idiopathic neuropathy 10/22/2021   lyrica     Ileostomy in place [facility])    Obstructive uropathy    With chronic left nephrostomy   Old retinal detachment, total or subtotal    Orthostatic hypotension 10/22/2021   Sleep apnea    does not need a machine   Stroke (HCC)    Ulcerative colitis (HCC)    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    Past Surgical History:  Procedure Laterality Date   BASAL CELL CARCINOMA EXCISION     10/23   COLON SURGERY     creation of j pouch     and subsequent takedown of j pouch   EYE SURGERY     IR CATHETER TUBE CHANGE  12/18/2023   IR CATHETER TUBE CHANGE  02/19/2024   IR CATHETER TUBE CHANGE  02/19/2024   IR NEPHROSTOMY EXCHANGE LEFT  12/10/2021   IR NEPHROSTOMY EXCHANGE LEFT  04/22/2022   IR NEPHROSTOMY EXCHANGE LEFT  07/29/2022   IR NEPHROSTOMY EXCHANGE LEFT  10/28/2022   IR NEPHROSTOMY EXCHANGE LEFT  02/05/2023   IR NEPHROSTOMY EXCHANGE LEFT  05/07/2023  IR NEPHROSTOMY EXCHANGE LEFT  06/25/2023   IR NEPHROSTOMY EXCHANGE LEFT  07/17/2023   IR NEPHROSTOMY EXCHANGE LEFT  10/22/2023   IR RADIOLOGIST EVAL & MGMT  11/25/2023   IR RADIOLOGIST EVAL & MGMT  12/18/2023   IR RADIOLOGIST EVAL & MGMT  02/19/2024   IR THROMBECTOMY AV FISTULA W/THROMBOLYSIS/PTA INC/SHUNT/IMG LEFT Left 02/23/2024   IR US  GUIDE VASC ACCESS LEFT  02/23/2024   LEFT HEART CATH AND CORONARY ANGIOGRAPHY N/A 07/22/2022   Procedure: LEFT HEART CATH AND CORONARY ANGIOGRAPHY;  Surgeon: Swaziland, Peter M, MD;  Location: [facility] INVASIVE CV LAB;  Service: Cardiovascular;  Laterality: N/A;   REVISION OF ARTERIOVENOUS GORETEX GRAFT Left 05/06/2022   Procedure: REDO LEFT THIGH  ARTERIOVENOUS 4-7 MM GORETEX GRAFT;  Surgeon: Eliza Lonni RAMAN, MD;  Location: [facility] OR;  Service: Vascular;  Laterality: Left;   SMALL INTESTINE SURGERY     TOTAL COLECTOMY      Allergies: Baclofen, Cephalosporins, Ciprofloxacin, and Wound dressing adhesive  Medications: Prior to Admission medications   Medication Sig Start Date End Date Taking? Authorizing Provider  albuterol  (PROVENTIL ) (2.5 MG/3ML) 0.083% nebulizer solution Take 2.5 mg by nebulization every 6 (six) hours as needed for wheezing or shortness of breath. Patient not taking: Reported on 03/01/2024    [provider]  albuterol  (VENTOLIN  HFA) 108 (90 Base) MCG/ACT inhaler Inhale 1-2 puffs into the lungs every 6 (six) hours as needed for wheezing or shortness of breath. 02/11/24   Katrinka Garnette KIDD, MD  atorvastatin  (LIPITOR) 20 MG tablet Take 1 tablet (20 mg total) by mouth daily. Patient not taking: No sig reported 11/14/23   Sebastian Toribio GAILS, MD  budesonide  (PULMICORT ) 0.5 MG/2ML nebulizer solution Take 0.5 mg by nebulization 2 (two) times daily as needed (for respiratory flares). Patient not taking: Reported on 03/01/2024    [provider]  calcitRIOL  (ROCALTROL ) 0.5 MCG capsule Take 1 capsule (0.5 mcg total) by mouth every Monday, Wednesday, and Friday with hemodialysis. 12/04/23   Danford, Lonni SQUIBB, MD  Cyanocobalamin  (VITAMIN B-12 PO) Take 1 capsule by mouth in the morning and at bedtime.    [provider]  dorzolamide  (TRUSOPT ) 2 % ophthalmic solution Place 1 drop into the right eye at bedtime. 06/17/23   [provider]  Doxylamine Succinate, Sleep, (SLEEP AID PO) Take 1 Dose by mouth at bedtime. Sleep aid of unknown name. OTC.    [provider]  ELIQUIS  2.5 MG TABS tablet Take 1 tablet (2.5 mg total) by mouth 2 (two) times daily. 02/17/24   Katrinka Garnette KIDD, MD  fludrocortisone  (FLORINEF ) 0.1 MG tablet Take 1 tablet (0.1 mg total) by mouth daily. 12/04/23   Danford,  Lonni SQUIBB, MD  folic acid  (FOLVITE ) 1 MG tablet Take 1 tablet (1 mg total) by mouth daily. Patient not taking: Reported on 03/01/2024 07/21/23   Regalado, Owen A, MD  gabapentin  (NEURONTIN ) 300 MG capsule Take 300 mg by mouth daily as needed (Pain). 09/09/23   [provider]  hydrocortisone  (CORTEF ) 10 MG tablet Take 2 tablets (20 mg total) by mouth daily and take 1 tablet (10 mg total) by mouth every evening. 02/11/24   Katrinka Garnette KIDD, MD  midodrine  (PROAMATINE ) 10 MG tablet Take 3 tablets (30 mg total) by mouth 3 (three) times daily. 02/03/24   Hunsucker, Donnice SAUNDERS, MD  mometasone -formoterol  (DULERA ) 200-5 MCG/ACT AERO Inhale 2 puffs into the lungs 2 (two) times daily. 09/08/23   Rosario Leatrice FERNS, MD  multivitamin (RENA-VIT) TABS tablet Take  1 tablet by mouth daily.    [provider]  Omega Fatty Acids-Vitamins (OMEGA-3 GUMMIES) CHEW Chew 1 tablet by mouth daily.    [provider]  pantoprazole  (PROTONIX ) 40 MG tablet Take 1 tablet (40 mg total) by mouth 2 (two) times daily. 11/13/23   Sebastian Toribio GAILS, MD  ramelteon  (ROZEREM ) 8 MG tablet Take 1 tablet (8 mg total) by mouth at bedtime. 10/27/23   Katrinka Garnette KIDD, MD  sertraline  (ZOLOFT ) 50 MG tablet Take 25 mg by mouth at bedtime.    [provider]  sevelamer  carbonate (RENVELA ) 800 MG tablet Take 1,600 mg by mouth 3 (three) times daily with meals.    [provider]  Sodium Chloride  Flush (SALINE FLUSH) 0.9 % SOLN Use 5 mLs by Intracatheter route daily as directed. 11/25/23   Caperilla, Marissa N, PA  timolol  (TIMOPTIC ) 0.5 % ophthalmic solution Place 1 drop into both eyes in the morning and at bedtime.    [provider]  traMADol  (ULTRAM ) 50 MG tablet Take 1 tablet (50 mg total) by mouth every 8 (eight) hours as needed (for pain). 12/03/23   Danford, Lonni SQUIBB, MD  TYLENOL  325 MG tablet Take 325-650 mg by mouth every 6 (six) hours as needed for mild pain (pain score 1-3) or  headache.    [provider]     Family History  Problem Relation Age of Onset   Stroke Mother    Cancer Father    Esophageal cancer Brother     Social History   Socioeconomic History   Marital status: Widowed    Spouse name: Not on file   Number of children: Not on file   Years of education: Not on file   Highest education level: 12th grade  Occupational History   Not on file  Tobacco Use   Smoking status: Former    Current packs/day: 0.00    Average packs/day: 2.0 packs/day for 6.0 years (12.0 ttl pk-yrs)    Types: Cigarettes    Start date: 54    Quit date: 80    Years since quitting: 40.7    Passive exposure: Never   Smokeless tobacco: Never  Vaping Use   Vaping status: Never Used  Substance and Sexual Activity   Alcohol use: Yes    Alcohol/week: 5.0 standard drinks of alcohol    Types: 5 Shots of liquor per week    Comment: socially   Drug use: Never   Sexual activity: Not Currently  Other Topics Concern   Not on file  Social History Narrative   Widowed- lost wife of 13 years to pancreatic cancer- previously married 43 years. Son in [place] and daughter helping in [place]. 4 grandkids.       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       Hobbies: dinner with family- occasional martini   Social Drivers of Health   Financial Resource Strain: Low Risk  (10/26/2023)   Overall Financial Resource Strain (CARDIA)    Difficulty of Paying Living Expenses: Not hard at all  Food Insecurity: No Food Insecurity (02/26/2024)   Hunger Vital Sign    Worried About Running Out of Food in the Last Year: Never true    Ran Out of Food in the Last Year: Never true  Transportation Needs: No Transportation Needs (02/26/2024)   PRAPARE - Administrator, Civil Service (Medical): No    Lack of Transportation (Non-Medical): No  Physical  Activity: Insufficiently Active (10/26/2023)   Exercise Vital Sign    Days of Exercise per Week: 1 day     Minutes of Exercise per Session: 10 min  Stress: Stress Concern Present (10/26/2023)   [employer] of Occupational Health - Occupational Stress Questionnaire    Feeling of Stress : To some extent  Social Connections: Patient Declined (02/22/2024)   Social Connection and Isolation Panel    Frequency of Communication with Friends and Family: Patient declined    Frequency of Social Gatherings with Friends and Family: Patient declined    Attends Religious Services: Patient declined    Database administrator or Organizations: Patient declined    Attends Banker Meetings: Patient declined    Marital Status: Patient declined  Recent Concern: Social Connections - Socially Isolated (01/27/2024)   Social Connection and Isolation Panel    Frequency of Communication with Friends and Family: More than three times a week    Frequency of Social Gatherings with Friends and Family: More than three times a week    Attends Religious Services: Never    Database administrator or Organizations: No    Attends Banker Meetings: Never    Marital Status: Widowed     Vital Signs: There were no vitals taken for this visit.  Physical Exam  Imaging: No results found.  Labs:  CBC: Recent Labs    02/02/24 0700 02/22/24 0804 02/22/24 0854 02/23/24 0526 02/24/24 0423  WBC 8.6 6.2  --  4.1 5.3  HGB 9.1* 10.1* 11.6* 10.2* 10.2*  HCT 28.3* 34.8* 34.0* 33.1* 32.4*  PLT 171 116*  --  124* 126*    COAGS: Recent Labs    11/27/23 1412 01/07/24 0841 01/25/24 1854 01/27/24 0024 01/30/24 0236 02/22/24 2009 02/23/24 0526 02/24/24 0423  INR 1.1 1.3* 1.3*  --   --   --  1.2  --   APTT  --   --   --    < > 90* 39* 124* 74*   < > = values in this interval not displayed.    BMP: Recent Labs    02/02/24 0700 02/22/24 0804 02/22/24 0854 02/23/24 0526 02/24/24 1502  NA 123* 137 133* 135 132*  K 4.4 4.8 4.5 6.0* 4.8  CL 88* 102 105 102 94*  CO2 18* 17*  --  21* 23   GLUCOSE 132* 153* 149* 125* 184*  BUN 86* 68* 61* 77* 54*  CALCIUM  8.1* 8.7*  --  8.7* 8.4*  CREATININE 9.72* 10.32* 11.00* 11.67* 7.88*  GFRNONAA 5* 4*  --  4* 6*    LIVER FUNCTION TESTS: Recent Labs    01/25/24 1854 01/27/24 1013 02/02/24 0700 02/22/24 0804 02/23/24 0526  BILITOT 0.7 1.0  --  0.8 0.9  AST 28 19  --  21 14*  ALT 27 16  --  24 21  ALKPHOS 144* 83  --  85 86  PROT 7.9 6.4*  --  6.5 6.3*  ALBUMIN  4.4 3.1* 3.6 3.3* 3.4*    Assessment:  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 Terrill attestation of this note for management and plan.

## 2024-03-14 ENCOUNTER — Other Ambulatory Visit: Payer: Self-pay

## 2024-03-14 ENCOUNTER — Encounter (HOSPITAL_COMMUNITY): Payer: Self-pay

## 2024-03-14 ENCOUNTER — Inpatient Hospital Stay (HOSPITAL_COMMUNITY)
Admission: EM | Admit: 2024-03-14 | Discharge: 2024-03-18 | DRG: 919 | Disposition: A | Discharge status: Home Health | Attending: Internal Medicine | Admitting: Internal Medicine

## 2024-03-14 ENCOUNTER — Emergency Department (HOSPITAL_COMMUNITY)

## 2024-03-14 ENCOUNTER — Inpatient Hospital Stay: Admission: RE | Admit: 2024-03-14 | Source: Ambulatory Visit

## 2024-03-14 ENCOUNTER — Other Ambulatory Visit

## 2024-03-14 ENCOUNTER — Ambulatory Visit: Payer: Self-pay

## 2024-03-14 DIAGNOSIS — D649 Anemia, unspecified: Secondary | ICD-10-CM | POA: Diagnosis present

## 2024-03-14 DIAGNOSIS — Z992 Dependence on renal dialysis: Secondary | ICD-10-CM | POA: Diagnosis not present

## 2024-03-14 DIAGNOSIS — L0291 Cutaneous abscess, unspecified: Secondary | ICD-10-CM | POA: Diagnosis present

## 2024-03-14 DIAGNOSIS — E875 Hyperkalemia: Secondary | ICD-10-CM | POA: Diagnosis not present

## 2024-03-14 DIAGNOSIS — Z883 Allergy status to other anti-infective agents status: Secondary | ICD-10-CM

## 2024-03-14 DIAGNOSIS — Z8616 Personal history of COVID-19: Secondary | ICD-10-CM

## 2024-03-14 DIAGNOSIS — A419 Sepsis, unspecified organism: Secondary | ICD-10-CM | POA: Diagnosis not present

## 2024-03-14 DIAGNOSIS — J90 Pleural effusion, not elsewhere classified: Secondary | ICD-10-CM | POA: Diagnosis present

## 2024-03-14 DIAGNOSIS — K5190 Ulcerative colitis, unspecified, without complications: Secondary | ICD-10-CM | POA: Diagnosis present

## 2024-03-14 DIAGNOSIS — Z86718 Personal history of other venous thrombosis and embolism: Secondary | ICD-10-CM

## 2024-03-14 DIAGNOSIS — R6510 Systemic inflammatory response syndrome (SIRS) of non-infectious origin without acute organ dysfunction: Secondary | ICD-10-CM | POA: Diagnosis not present

## 2024-03-14 DIAGNOSIS — Z91048 Other nonmedicinal substance allergy status: Secondary | ICD-10-CM

## 2024-03-14 DIAGNOSIS — Z7901 Long term (current) use of anticoagulants: Secondary | ICD-10-CM

## 2024-03-14 DIAGNOSIS — F32A Depression, unspecified: Secondary | ICD-10-CM | POA: Diagnosis present

## 2024-03-14 DIAGNOSIS — N186 End stage renal disease: Secondary | ICD-10-CM | POA: Diagnosis present

## 2024-03-14 DIAGNOSIS — Z936 Other artificial openings of urinary tract status: Secondary | ICD-10-CM

## 2024-03-14 DIAGNOSIS — I959 Hypotension, unspecified: Principal | ICD-10-CM

## 2024-03-14 DIAGNOSIS — E2740 Unspecified adrenocortical insufficiency: Secondary | ICD-10-CM | POA: Diagnosis present

## 2024-03-14 DIAGNOSIS — N139 Obstructive and reflux uropathy, unspecified: Secondary | ICD-10-CM | POA: Diagnosis present

## 2024-03-14 DIAGNOSIS — I9589 Other hypotension: Secondary | ICD-10-CM | POA: Diagnosis present

## 2024-03-14 DIAGNOSIS — Z881 Allergy status to other antibiotic agents status: Secondary | ICD-10-CM

## 2024-03-14 DIAGNOSIS — Z7951 Long term (current) use of inhaled steroids: Secondary | ICD-10-CM

## 2024-03-14 DIAGNOSIS — Z8673 Personal history of transient ischemic attack (TIA), and cerebral infarction without residual deficits: Secondary | ICD-10-CM

## 2024-03-14 DIAGNOSIS — Z932 Ileostomy status: Secondary | ICD-10-CM

## 2024-03-14 DIAGNOSIS — Z85828 Personal history of other malignant neoplasm of skin: Secondary | ICD-10-CM

## 2024-03-14 DIAGNOSIS — K6819 Other retroperitoneal abscess: Secondary | ICD-10-CM | POA: Diagnosis present

## 2024-03-14 DIAGNOSIS — Z87891 Personal history of nicotine dependence: Secondary | ICD-10-CM

## 2024-03-14 DIAGNOSIS — I480 Paroxysmal atrial fibrillation: Secondary | ICD-10-CM | POA: Diagnosis not present

## 2024-03-14 DIAGNOSIS — Z7952 Long term (current) use of systemic steroids: Secondary | ICD-10-CM

## 2024-03-14 DIAGNOSIS — T85628A Displacement of other specified internal prosthetic devices, implants and grafts, initial encounter: Secondary | ICD-10-CM | POA: Diagnosis not present

## 2024-03-14 DIAGNOSIS — E271 Primary adrenocortical insufficiency: Secondary | ICD-10-CM | POA: Diagnosis present

## 2024-03-14 DIAGNOSIS — Z91119 Patient's noncompliance with dietary regimen due to unspecified reason: Secondary | ICD-10-CM

## 2024-03-14 DIAGNOSIS — D696 Thrombocytopenia, unspecified: Secondary | ICD-10-CM | POA: Diagnosis present

## 2024-03-14 DIAGNOSIS — L02212 Cutaneous abscess of back [any part, except buttock]: Secondary | ICD-10-CM | POA: Diagnosis present

## 2024-03-14 DIAGNOSIS — Z79899 Other long term (current) drug therapy: Secondary | ICD-10-CM

## 2024-03-14 DIAGNOSIS — Y848 Other medical procedures as the cause of abnormal reaction of the patient, or of later complication, without mention of misadventure at the time of the procedure: Secondary | ICD-10-CM | POA: Diagnosis present

## 2024-03-14 DIAGNOSIS — Z66 Do not resuscitate: Secondary | ICD-10-CM | POA: Diagnosis present

## 2024-03-14 DIAGNOSIS — Z9049 Acquired absence of other specified parts of digestive tract: Secondary | ICD-10-CM

## 2024-03-14 DIAGNOSIS — H409 Unspecified glaucoma: Secondary | ICD-10-CM | POA: Diagnosis present

## 2024-03-14 LAB — CBC WITH DIFFERENTIAL/PLATELET
Basophils Absolute: 0.2 K/uL — ABNORMAL HIGH (ref 0.0–0.1)
Basophils Relative: 2 %
Eosinophils Absolute: 0.1 K/uL (ref 0.0–0.5)
Eosinophils Relative: 1 %
HCT: 36 % — ABNORMAL LOW (ref 39.0–52.0)
Hemoglobin: 11.1 g/dL — ABNORMAL LOW (ref 13.0–17.0)
Lymphocytes Relative: 3 %
Lymphs Abs: 0.2 K/uL — ABNORMAL LOW (ref 0.7–4.0)
MCH: 36.2 pg — ABNORMAL HIGH (ref 26.0–34.0)
MCHC: 30.8 g/dL (ref 30.0–36.0)
MCV: 117.3 fL — ABNORMAL HIGH (ref 80.0–100.0)
Monocytes Absolute: 0.3 K/uL (ref 0.1–1.0)
Monocytes Relative: 4 %
Neutro Abs: 7.2 K/uL (ref 1.7–7.7)
Neutrophils Relative %: 90 %
Platelets: 146 K/uL — ABNORMAL LOW (ref 150–400)
RBC: 3.07 MIL/uL — ABNORMAL LOW (ref 4.22–5.81)
RDW: 18.4 % — ABNORMAL HIGH (ref 11.5–15.5)
WBC: 8 K/uL (ref 4.0–10.5)
nRBC: 0 % (ref 0.0–0.2)

## 2024-03-14 LAB — COMPREHENSIVE METABOLIC PANEL WITH GFR
ALT: 23 U/L (ref 0–44)
AST: 24 U/L (ref 15–41)
Albumin: 3.7 g/dL (ref 3.5–5.0)
Alkaline Phosphatase: 73 U/L (ref 38–126)
Anion gap: 17 — ABNORMAL HIGH (ref 5–15)
BUN: 29 mg/dL — ABNORMAL HIGH (ref 8–23)
CO2: 25 mmol/L (ref 22–32)
Calcium: 9.3 mg/dL (ref 8.9–10.3)
Chloride: 96 mmol/L — ABNORMAL LOW (ref 98–111)
Creatinine, Ser: 6.05 mg/dL — ABNORMAL HIGH (ref 0.61–1.24)
GFR, Estimated: 9 mL/min — ABNORMAL LOW (ref >60)
Glucose, Bld: 128 mg/dL — ABNORMAL HIGH (ref 70–99)
Potassium: 4.6 mmol/L (ref 3.5–5.1)
Sodium: 138 mmol/L (ref 135–145)
Total Bilirubin: 1.2 mg/dL (ref 0.0–1.2)
Total Protein: 6.9 g/dL (ref 6.5–8.1)

## 2024-03-14 LAB — I-STAT CG4 LACTIC ACID, ED
Lactic Acid, Venous: 1.4 mmol/L (ref 0.5–1.9)
Lactic Acid, Venous: 2 mmol/L (ref 0.5–1.9)
Lactic Acid, Venous: 2.3 mmol/L (ref 0.5–1.9)

## 2024-03-14 MED ORDER — VANCOMYCIN HCL 1500 MG/300ML IV SOLN
1500.0000 mg | Freq: Once | INTRAVENOUS | Status: AC
Start: 2024-03-14 — End: 2024-03-15
  Administered 2024-03-14: 1500 mg via INTRAVENOUS
  Filled 2024-03-14: qty 300

## 2024-03-14 MED ORDER — HYDROCORTISONE SOD SUC (PF) 100 MG IJ SOLR
100.0000 mg | Freq: Once | INTRAMUSCULAR | Status: AC
  Administered 2024-03-14: 100 mg via INTRAVENOUS
  Filled 2024-03-14: qty 2

## 2024-03-14 MED ORDER — SODIUM CHLORIDE 0.9 % IV BOLUS
500.0000 mL | Freq: Once | INTRAVENOUS | Status: AC
  Administered 2024-03-14: 500 mL via INTRAVENOUS

## 2024-03-14 MED ORDER — VANCOMYCIN VARIABLE DOSE PER UNSTABLE RENAL FUNCTION (PHARMACIST DOSING): Status: DC

## 2024-03-14 MED ORDER — VANCOMYCIN HCL IN DEXTROSE 1-5 GM/200ML-% IV SOLN: 1000.0000 mg | Freq: Once | INTRAVENOUS | Status: DC

## 2024-03-14 MED ORDER — IOHEXOL 350 MG/ML SOLN
75.0000 mL | Freq: Once | INTRAVENOUS | Status: AC | PRN
  Administered 2024-03-14: 75 mL via INTRAVENOUS

## 2024-03-14 MED ORDER — PIPERACILLIN-TAZOBACTAM 3.375 G IVPB 30 MIN
3.3750 g | Freq: Once | INTRAVENOUS | Status: AC
  Administered 2024-03-14: 3.375 g via INTRAVENOUS
  Filled 2024-03-14: qty 50

## 2024-03-14 MED ORDER — LACTATED RINGERS IV BOLUS (SEPSIS)
500.0000 mL | Freq: Once | INTRAVENOUS | Status: AC
  Administered 2024-03-14: 500 mL via INTRAVENOUS

## 2024-03-14 MED ORDER — OXYCODONE HCL 5 MG PO TABS: 2.5000 mg | ORAL_TABLET | Freq: Once | ORAL | Status: DC

## 2024-03-14 NOTE — H&P (Signed)
 History and Physical    Joshua Riley FMW:[id number] DOB: Apr 13, 1936 DOA: 03/14/2024  Patient coming from: ***  Chief Complaint: ***  HPI: Joshua Riley is a 88 y.o. male with ***   ED Course: ***  Review of Systems: As per HPI, rest all negative.   Past Medical History:  Diagnosis Date   A-fib (HCC)    Anemia    Arthritis    Cancer (HCC)    Basal cell   COVID-19    2021   Dysrhythmia    Afib-controlled on eliquis    ESRD (end stage renal disease) (HCC) 10/22/2021   Glaucoma 11/18/2021   History of DVT (deep vein thrombosis)    Hydronephrosis    managed wtih a PCN   Idiopathic neuropathy 10/22/2021   lyrica     Ileostomy in place [facility])    Obstructive uropathy    With chronic left nephrostomy   Old retinal detachment, total or subtotal    Orthostatic hypotension 10/22/2021   Sleep apnea    does not need a machine   Stroke (HCC)    Ulcerative colitis (HCC)    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    Past Surgical History:  Procedure Laterality Date   BASAL CELL CARCINOMA EXCISION     10/23   COLON SURGERY     creation of j pouch     and subsequent takedown of j pouch   EYE SURGERY     IR CATHETER TUBE CHANGE  12/18/2023   IR CATHETER TUBE CHANGE  02/19/2024   IR CATHETER TUBE CHANGE  02/19/2024   IR NEPHROSTOMY EXCHANGE LEFT  12/10/2021   IR NEPHROSTOMY EXCHANGE LEFT  04/22/2022   IR NEPHROSTOMY EXCHANGE LEFT  07/29/2022   IR NEPHROSTOMY EXCHANGE LEFT  10/28/2022   IR NEPHROSTOMY EXCHANGE LEFT  02/05/2023   IR NEPHROSTOMY EXCHANGE LEFT  05/07/2023   IR NEPHROSTOMY EXCHANGE LEFT  06/25/2023   IR NEPHROSTOMY EXCHANGE LEFT  07/17/2023   IR NEPHROSTOMY EXCHANGE LEFT  10/22/2023   IR RADIOLOGIST EVAL & MGMT  11/25/2023   IR RADIOLOGIST EVAL & MGMT  12/18/2023   IR RADIOLOGIST EVAL & MGMT  02/19/2024   IR THROMBECTOMY AV FISTULA W/THROMBOLYSIS/PTA INC/SHUNT/IMG LEFT Left 02/23/2024   IR US  GUIDE VASC ACCESS LEFT  02/23/2024   LEFT HEART CATH AND  CORONARY ANGIOGRAPHY N/A 07/22/2022   Procedure: LEFT HEART CATH AND CORONARY ANGIOGRAPHY;  Surgeon: [place]azil[place], Peter M, MD;  Location: [facility] INVASIVE CV LAB;  Service: Cardiovascular;  Laterality: N/A;   REVISION OF ARTERIOVENOUS GORETEX GRAFT Left 05/06/2022   Procedure: REDO LEFT THIGH ARTERIOVENOUS 4-7 MM GORETEX GRAFT;  Surgeon: Eliza Lonni RAMAN, MD;  Location: [facility] OR;  Service: Vascular;  Laterality: Left;   SMALL INTESTINE SURGERY     TOTAL COLECTOMY       reports that he quit smoking about 40 years ago. His smoking use included cigarettes. He started smoking about 46 years ago. He has a 12 pack-year smoking history. He has never been exposed to tobacco smoke. He has never used smokeless tobacco. He reports current alcohol use of about 5.0 standard drinks of alcohol per week. He reports that he does not use drugs.  Allergies  Allergen Reactions   Baclofen Other (See Comments)    Altered mental status, after accidental overdose     Cephalosporins Rash   Ciprofloxacin Itching and Rash   Wound Dressing Adhesive Rash    Family History  Problem Relation Age of Onset  Stroke Mother    Cancer Father    Esophageal cancer Brother     Prior to Admission medications   Medication Sig Start Date End Date Taking? Authorizing Provider  albuterol  (PROVENTIL ) (2.5 MG/3ML) 0.083% nebulizer solution Take 2.5 mg by nebulization every 6 (six) hours as needed for wheezing or shortness of breath. Patient not taking: Reported on 03/01/2024    [provider]  albuterol  (VENTOLIN  HFA) 108 (90 Base) MCG/ACT inhaler Inhale 1-2 puffs into the lungs every 6 (six) hours as needed for wheezing or shortness of breath. 02/11/24   Katrinka Garnette KIDD, MD  atorvastatin  (LIPITOR) 20 MG tablet Take 1 tablet (20 mg total) by mouth daily. Patient not taking: No sig reported 11/14/23   Sebastian Toribio GAILS, MD  budesonide  (PULMICORT ) 0.5 MG/2ML nebulizer solution Take 0.5 mg by nebulization 2 (two) times daily as  needed (for respiratory flares). Patient not taking: Reported on 03/01/2024    [provider]  calcitRIOL  (ROCALTROL ) 0.5 MCG capsule Take 1 capsule (0.5 mcg total) by mouth every Monday, Wednesday, and Friday with hemodialysis. 12/04/23   Danford, Lonni SQUIBB, MD  Cyanocobalamin  (VITAMIN B-12 PO) Take 1 capsule by mouth in the morning and at bedtime.    [provider]  dorzolamide  (TRUSOPT ) 2 % ophthalmic solution Place 1 drop into the right eye at bedtime. 06/17/23   [provider]  Doxylamine Succinate, Sleep, (SLEEP AID PO) Take 1 Dose by mouth at bedtime. Sleep aid of unknown name. OTC.    [provider]  ELIQUIS  2.5 MG TABS tablet Take 1 tablet (2.5 mg total) by mouth 2 (two) times daily. 02/17/24   Katrinka Garnette KIDD, MD  fludrocortisone  (FLORINEF ) 0.1 MG tablet Take 1 tablet (0.1 mg total) by mouth daily. 12/04/23   Danford, Lonni SQUIBB, MD  folic acid  (FOLVITE ) 1 MG tablet Take 1 tablet (1 mg total) by mouth daily. Patient not taking: Reported on 03/01/2024 07/21/23   Regalado, Owen A, MD  gabapentin  (NEURONTIN ) 300 MG capsule Take 300 mg by mouth daily as needed (Pain). 09/09/23   [provider]  hydrocortisone  (CORTEF ) 10 MG tablet Take 2 tablets (20 mg total) by mouth daily and take 1 tablet (10 mg total) by mouth every evening. 02/11/24   Katrinka Garnette KIDD, MD  midodrine  (PROAMATINE ) 10 MG tablet Take 3 tablets (30 mg total) by mouth 3 (three) times daily. 02/03/24   Hunsucker, Donnice SAUNDERS, MD  mometasone -formoterol  (DULERA ) 200-5 MCG/ACT AERO Inhale 2 puffs into the lungs 2 (two) times daily. 09/08/23   Rosario Eland I, MD  multivitamin (RENA-VIT) TABS tablet Take 1 tablet by mouth daily.    [provider]  Omega Fatty Acids-Vitamins (OMEGA-3 GUMMIES) CHEW Chew 1 tablet by mouth daily.    [provider]  pantoprazole  (PROTONIX ) 40 MG tablet Take 1 tablet (40 mg total) by mouth 2 (two) times daily. 11/13/23   Sebastian Toribio GAILS, MD  ramelteon  (ROZEREM ) 8 MG tablet Take 1 tablet (8 mg total) by mouth at bedtime. 10/27/23   Katrinka Garnette KIDD, MD  sertraline  (ZOLOFT ) 50 MG tablet Take 25 mg by mouth at bedtime.    [provider]  sevelamer  carbonate (RENVELA ) 800 MG tablet Take 1,600 mg by mouth 3 (three) times daily with meals.    [provider]  Sodium Chloride  Flush (SALINE FLUSH) 0.9 % SOLN Use 5 mLs by Intracatheter route daily as directed. 11/25/23   Caperilla, Marissa N, PA  timolol  (TIMOPTIC ) 0.5 % ophthalmic  solution Place 1 drop into both eyes in the morning and at bedtime.    [provider]  traMADol  (ULTRAM ) 50 MG tablet Take 1 tablet (50 mg total) by mouth every 8 (eight) hours as needed (for pain). 12/03/23   Danford, Lonni SQUIBB, MD  TYLENOL  325 MG tablet Take 325-650 mg by mouth every 6 (six) hours as needed for mild pain (pain score 1-3) or headache.    [provider]    Physical Exam: Constitutional: *** Vitals:   03/14/24 2200 03/14/24 2205 03/14/24 2210 03/14/24 2255  BP: (!) 88/71   (!) 89/69  Pulse: (!) 131   96  Resp: 17 (!) 25 (!) 21 (!) 26  Temp:      TempSrc:      SpO2:    100%   Eyes: *** ENMT: *** Neck: *** Respiratory: *** Cardiovascular: *** Abdomen: *** Musculoskeletal: *** Skin: *** Neurologic:*** Psychiatric: ***   Labs on Admission: I have personally reviewed following labs and imaging studies  CBC: Recent Labs  Lab 03/14/24 1700  WBC 8.0  NEUTROABS 7.2  HGB 11.1*  HCT 36.0*  MCV 117.3*  PLT 146*   Basic Metabolic Panel: Recent Labs  Lab 03/14/24 1700  NA 138  K 4.6  CL 96*  CO2 25  GLUCOSE 128*  BUN 29*  CREATININE 6.05*  CALCIUM  9.3   GFR: Estimated Creatinine Clearance: 8.6 mL/min (A) (by C-G formula based on SCr of 6.05 mg/dL (H)). Liver Function Tests: Recent Labs  Lab 03/14/24 1700  AST 24  ALT 23  ALKPHOS 73  BILITOT 1.2  PROT 6.9  ALBUMIN  3.7   No results for input(s): LIPASE, AMYLASE  in the last 168 hours. No results for input(s): AMMONIA in the last 168 hours. Coagulation Profile: No results for input(s): INR, PROTIME in the last 168 hours. Cardiac Enzymes: No results for input(s): CKTOTAL, CKMB, CKMBINDEX, TROPONINI in the last 168 hours. BNP (last 3 results) No results for input(s): PROBNP in the last 8760 hours. HbA1C: No results for input(s): HGBA1C in the last 72 hours. CBG: No results for input(s): GLUCAP in the last 168 hours. Lipid Profile: No results for input(s): CHOL, HDL, LDLCALC, TRIG, CHOLHDL, LDLDIRECT in the last 72 hours. Thyroid Function Tests: No results for input(s): TSH, T4TOTAL, FREET4, T3FREE, THYROIDAB in the last 72 hours. Anemia Panel: No results for input(s): VITAMINB12, FOLATE, FERRITIN, TIBC, IRON, RETICCTPCT in the last 72 hours. Urine analysis:    Component Value Date/Time   COLORURINE STRAW (A) 07/09/2023 1221   APPEARANCEUR TURBID (A) 07/09/2023 1221   LABSPEC  07/09/2023 1221    TEST NOT REPORTED DUE TO COLOR INTERFERENCE OF URINE PIGMENT   PHURINE  07/09/2023 1221    TEST NOT REPORTED DUE TO COLOR INTERFERENCE OF URINE PIGMENT   GLUCOSEU (A) 07/09/2023 1221    TEST NOT REPORTED DUE TO COLOR INTERFERENCE OF URINE PIGMENT   HGBUR (A) 07/09/2023 1221    TEST NOT REPORTED DUE TO COLOR INTERFERENCE OF URINE PIGMENT   BILIRUBINUR (A) 07/09/2023 1221    TEST NOT REPORTED DUE TO COLOR INTERFERENCE OF URINE PIGMENT   KETONESUR (A) 07/09/2023 1221    TEST NOT REPORTED DUE TO COLOR INTERFERENCE OF URINE PIGMENT   PROTEINUR (A) 07/09/2023 1221    TEST NOT REPORTED DUE TO COLOR INTERFERENCE OF URINE PIGMENT   NITRITE (A) 07/09/2023 1221    TEST NOT REPORTED DUE TO COLOR INTERFERENCE OF URINE PIGMENT   LEUKOCYTESUR (A) 07/09/2023 1221  TEST NOT REPORTED DUE TO COLOR INTERFERENCE OF URINE PIGMENT   Sepsis Labs: @LABRCNTIP (procalcitonin:4,lacticidven:4) )No results found for  this or any previous visit (from the past 240 hours).   Radiological Exams on Admission: CT ABDOMEN PELVIS W CONTRAST Result Date: 03/14/2024 CLINICAL DATA:  Inadvertent left drain removal drain site is red and looks infected increased confusion EXAM: CT ABDOMEN AND PELVIS WITH CONTRAST TECHNIQUE: Multidetector CT imaging of the abdomen and pelvis was performed using the standard protocol following bolus administration of intravenous contrast. RADIATION DOSE REDUCTION: This exam was performed according to the departmental dose-optimization program which includes automated exposure control, adjustment of the mA and/or kV according to patient size and/or use of iterative reconstruction technique. CONTRAST:  75mL OMNIPAQUE  IOHEXOL  350 MG/ML SOLN COMPARISON:  CT 02/24/2024, 02/19/2024, 01/25/2024, exams dating back to 06/11/2023 FINDINGS: Lower chest: Lung bases demonstrate small moderate right pleural effusion. Passive atelectasis in the right base. Decreased left pleural effusion compared to prior. Cardiomegaly. Trace pericardial effusion or thickening. Gynecomastia Hepatobiliary: Gallstone. No focal hepatic abnormality or biliary dilatation Pancreas: Atrophic.  No inflammation or ductal dilatation Spleen: Normal in size without focal abnormality. Adrenals/Urinary Tract: Adrenal glands are normal. Atrophic native kidneys. Left-sided percutaneous nephrostomy tube remains in place. Thickened bladder wall with decompressed bladder. Stomach/Bowel: Stomach within normal limits. No dilated small bowel. Total colectomy with right abdominal ileostomy. No obstructive features. Vascular/Lymphatic: Moderate aortic atherosclerosis. No aneurysm. Partially visualized AV loop graft in the left groin and lower extremity. Small chronic fluid collection anterior to the femoral vessels. Enlarged tortuous appearing left external iliac vein. No suspicious lymph nodes Reproductive: Prostate appears enlarged. Penile implant with right  anterior pelvic reservoir Other: No free air. No significant ascites. Interim removal of left transgluteal percutaneous drainage catheter. Compared with 02/19/2024, decreased size of presacral fluid collection that contains a few gas bubbles. Residual fluid collection measuring 1.8 cm transverse by 1.8 cm AP by 5.5 cm oblique craniocaudal on series 7, image 65, previously the collection measured 4.3 x 3.4 x 9.4 cm. Musculoskeletal: No acute or suspicious osseous abnormality. 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 Interim removal of left transgluteal percutaneous drainage catheter. Compared with 02/19/2024, decreased size of presacral fluid and gas containing collection. Residual fluid collection measuring 1.8 x 1.8 x 5.5 cm, previously 4.3 x 3.4 x 9.4 cm. Some left gluteal subcutaneous soft tissue stranding at the site of the prior gluteal catheter but no rim enhancing fluid collections within the left gluteal soft tissues. 2. Small to moderate right pleural effusion with passive atelectasis in the right base. Decreased left pleural effusion compared to prior. 3. Atrophic native kidneys with left-sided percutaneous nephrostomy tube in place. No hydronephrosis. 4. Total colectomy with right abdominal ileostomy. No obstructive features. 5. Aortic atherosclerosis. Aortic Atherosclerosis (ICD10-I70.0). Electronically Signed   By: Luke Bun M.D.   On: 03/14/2024 22:18   DG Chest Portable 1 View Result Date: 03/14/2024 EXAM: 1 VIEW XRAY OF THE CHEST 03/14/2024 09:25:00 PM COMPARISON: 02/22/2024 CLINICAL HISTORY: Flank pain. Drain placed in his pelvis through left lower back hip area several weeks ago, drain came out Sat. Family states site is red and looks infected. Family endorses some confusion over last 2 days. Also endorses 9/10 pain. FINDINGS: LUNGS AND PLEURA: Pulmonary vascular congestion. Small right pleural effusion and associated basilar airspace opacities.  No pneumothorax. HEART AND MEDIASTINUM: Stable cardiomegaly. BONES AND SOFT TISSUES: No acute osseous abnormality. VASCULATURE: Right subclavian stent. IMPRESSION: 1. Small  right pleural effusion and associated basilar airspace opacities. 2. Stable cardiomegaly and pulmonary vascular congestion. Electronically signed by: Norman Gatlin MD 03/14/2024 09:33 PM EDT RP Workstation: [id number]    EKG: Independently reviewed. ***  Assessment/Plan Principal Problem:   SIRS (systemic inflammatory response syndrome) (HCC)    ***   DVT prophylaxis: *** Code Status: ***  Family Communication: ***  Disposition Plan: ***  Consults called: ***  Admission status: ***

## 2024-03-14 NOTE — ED Notes (Signed)
 Extra Blue top drawn

## 2024-03-14 NOTE — Sepsis Progress Note (Signed)
 Following for sepsis monitoring ?

## 2024-03-14 NOTE — Telephone Encounter (Signed)
 Dr. Katrinka informed and agreeable with plan for ED.

## 2024-03-14 NOTE — ED Provider Notes (Signed)
 [facility] Provider Note   CSN: [id number] Arrival date & time: 03/14/24  1545     Patient presents with: Wound Check   Joshua Riley is a 88 y.o. male.   Patient presents after drain in the left buttocks came out on Saturday.  Patient had mild drainage and redness from the site.  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  This specific drain was placed by IR.  Family reports mild confusion and chills no fever.  Patient denies abdominal pain or vomiting.  The history is provided by the patient and a relative.  Wound Check This is a recurrent problem. Pertinent negatives include no chest pain, no abdominal pain, no headaches and no shortness of breath.       Prior to Admission medications   Medication Sig Start Date End Date Taking? Authorizing Provider  albuterol  (PROVENTIL ) (2.5 MG/3ML) 0.083% nebulizer solution Take 2.5 mg by nebulization every 6 (six) hours as needed for wheezing or shortness of breath. Patient not taking: Reported on 03/01/2024    [provider]  albuterol  (VENTOLIN  HFA) 108 (90 Base) MCG/ACT inhaler Inhale 1-2 puffs into the lungs every 6 (six) hours as needed for wheezing or shortness of breath. 02/11/24   Katrinka Garnette KIDD, MD  atorvastatin  (LIPITOR) 20 MG tablet Take 1 tablet (20 mg total) by mouth daily. Patient not taking: No sig reported 11/14/23   Sebastian Toribio GAILS, MD  budesonide  (PULMICORT ) 0.5 MG/2ML nebulizer solution Take 0.5 mg by nebulization 2 (two) times daily as needed (for respiratory flares). Patient not taking: Reported on 03/01/2024    [provider]  calcitRIOL  (ROCALTROL ) 0.5 MCG capsule Take 1 capsule (0.5 mcg total) by mouth every Monday, Wednesday, and Friday with hemodialysis. 12/04/23   Danford, Lonni SQUIBB, MD  Cyanocobalamin  (VITAMIN B-12 PO) Take 1 capsule by mouth in the morning and at bedtime.    [provider]  dorzolamide  (TRUSOPT ) 2 %  ophthalmic solution Place 1 drop into the right eye at bedtime. 06/17/23   [provider]  Doxylamine Succinate, Sleep, (SLEEP AID PO) Take 1 Dose by mouth at bedtime. Sleep aid of unknown name. OTC.    [provider]  ELIQUIS  2.5 MG TABS tablet Take 1 tablet (2.5 mg total) by mouth 2 (two) times daily. 02/17/24   Katrinka Garnette KIDD, MD  fludrocortisone  (FLORINEF ) 0.1 MG tablet Take 1 tablet (0.1 mg total) by mouth daily. 12/04/23   Danford, Lonni SQUIBB, MD  folic acid  (FOLVITE ) 1 MG tablet Take 1 tablet (1 mg total) by mouth daily. Patient not taking: Reported on 03/01/2024 07/21/23   Regalado, Owen A, MD  gabapentin  (NEURONTIN ) 300 MG capsule Take 300 mg by mouth daily as needed (Pain). 09/09/23   [provider]  hydrocortisone  (CORTEF ) 10 MG tablet Take 2 tablets (20 mg total) by mouth daily and take 1 tablet (10 mg total) by mouth every evening. 02/11/24   Katrinka Garnette KIDD, MD  midodrine  (PROAMATINE ) 10 MG tablet Take 3 tablets (30 mg total) by mouth 3 (three) times daily. 02/03/24   Hunsucker, Donnice SAUNDERS, MD  mometasone -formoterol  (DULERA ) 200-5 MCG/ACT AERO Inhale 2 puffs into the lungs 2 (two) times daily. 09/08/23   Rosario Eland I, MD  multivitamin (RENA-VIT) TABS tablet Take 1 tablet by mouth daily.    [provider]  Omega Fatty Acids-Vitamins (OMEGA-3 GUMMIES) CHEW Chew 1 tablet by mouth daily.    [provider]  pantoprazole  (  PROTONIX ) 40 MG tablet Take 1 tablet (40 mg total) by mouth 2 (two) times daily. 11/13/23   Sebastian Toribio GAILS, MD  ramelteon  (ROZEREM ) 8 MG tablet Take 1 tablet (8 mg total) by mouth at bedtime. 10/27/23   Katrinka Garnette KIDD, MD  sertraline  (ZOLOFT ) 50 MG tablet Take 25 mg by mouth at bedtime.    [provider]  sevelamer  carbonate (RENVELA ) 800 MG tablet Take 1,600 mg by mouth 3 (three) times daily with meals.    [provider]  Sodium Chloride  Flush (SALINE FLUSH) 0.9 % SOLN Use 5 mLs by Intracatheter  route daily as directed. 11/25/23   Caperilla, Marissa N, PA  timolol  (TIMOPTIC ) 0.5 % ophthalmic solution Place 1 drop into both eyes in the morning and at bedtime.    [provider]  traMADol  (ULTRAM ) 50 MG tablet Take 1 tablet (50 mg total) by mouth every 8 (eight) hours as needed (for pain). 12/03/23   Danford, Lonni SQUIBB, MD  TYLENOL  325 MG tablet Take 325-650 mg by mouth every 6 (six) hours as needed for mild pain (pain score 1-3) or headache.    [provider]    Allergies: Baclofen, Cephalosporins, Ciprofloxacin, and Wound dressing adhesive    Review of Systems  Constitutional:  Negative for chills and fever.  HENT:  Negative for congestion.   Eyes:  Negative for visual disturbance.  Respiratory:  Negative for shortness of breath.   Cardiovascular:  Negative for chest pain.  Gastrointestinal:  Negative for abdominal pain and vomiting.  Genitourinary:  Negative for dysuria and flank pain.  Musculoskeletal:  Negative for back pain, neck pain and neck stiffness.  Skin:  Positive for wound. Negative for rash.  Neurological:  Negative for light-headedness and headaches.    Updated Vital Signs BP (!) 89/69   Pulse 96   Temp 98.2 F (36.8 C) (Rectal)   Resp (!) 26   SpO2 100%   Physical Exam Vitals and nursing note reviewed.  Constitutional:      General: He is not in acute distress.    Appearance: He is well-developed.  HENT:     Head: Normocephalic and atraumatic.     Mouth/Throat:     Mouth: Mucous membranes are moist.  Eyes:     General:        Right eye: No discharge.        Left eye: No discharge.     Conjunctiva/sclera: Conjunctivae normal.  Neck:     Trachea: No tracheal deviation.  Cardiovascular:     Rate and Rhythm: Regular rhythm. Tachycardia present.  Pulmonary:     Effort: Pulmonary effort is normal.     Breath sounds: Normal breath sounds.  Abdominal:     General: There is no distension.     Palpations: Abdomen is soft.      Tenderness: There is no abdominal tenderness. There is no guarding.  Musculoskeletal:     Cervical back: Normal range of motion and neck supple. No rigidity.  Skin:    General: Skin is warm.     Capillary Refill: Capillary refill takes less than 2 seconds.     Findings: Rash present.     Comments: Patient has mild erythema no active drainage room recently removed drain left buttocks upper region.  Nephrostomy tube in place left flank without signs of infection.  Colostomy right lower abdomen.  Neurological:     General: No focal deficit present.     Mental Status: He is alert.  Cranial Nerves: No cranial nerve deficit.     Comments: Minimal confusion at times overall answers majority of questions appropriately.  Psychiatric:     Comments: Patient cooperative, no agitation or aggression     (all labs ordered are listed, but only abnormal results are displayed) Labs Reviewed  COMPREHENSIVE METABOLIC PANEL WITH GFR - Abnormal; Notable for the following components:      Result Value   Chloride 96 (*)    Glucose, Bld 128 (*)    BUN 29 (*)    Creatinine, Ser 6.05 (*)    GFR, Estimated 9 (*)    Anion gap 17 (*)    All other components within normal limits  CBC WITH DIFFERENTIAL/PLATELET - Abnormal; Notable for the following components:   RBC 3.07 (*)    Hemoglobin 11.1 (*)    HCT 36.0 (*)    MCV 117.3 (*)    MCH 36.2 (*)    RDW 18.4 (*)    Platelets 146 (*)    Lymphs Abs 0.2 (*)    Basophils Absolute 0.2 (*)    All other components within normal limits  I-STAT CG4 LACTIC ACID, ED - Abnormal; Notable for the following components:   Lactic Acid, Venous 2.3 (*)    All other components within normal limits  I-STAT CG4 LACTIC ACID, ED - Abnormal; Notable for the following components:   Lactic Acid, Venous 2.0 (*)    All other components within normal limits  URINE CULTURE  CULTURE, BLOOD (ROUTINE X 2)  CULTURE, BLOOD (ROUTINE X 2)  URINALYSIS, ROUTINE W REFLEX MICROSCOPIC   I-STAT CG4 LACTIC ACID, ED    EKG: None  Radiology: CT ABDOMEN PELVIS W CONTRAST Result Date: 03/14/2024 CLINICAL DATA:  Inadvertent left drain removal drain site is red and looks infected increased confusion EXAM: CT ABDOMEN AND PELVIS WITH CONTRAST TECHNIQUE: Multidetector CT imaging of the abdomen and pelvis was performed using the standard protocol following bolus administration of intravenous contrast. RADIATION DOSE REDUCTION: This exam was performed according to the departmental dose-optimization program which includes automated exposure control, adjustment of the mA and/or kV according to patient size and/or use of iterative reconstruction technique. CONTRAST:  75mL OMNIPAQUE  IOHEXOL  350 MG/ML SOLN COMPARISON:  CT 02/24/2024, 02/19/2024, 01/25/2024, exams dating back to 06/11/2023 FINDINGS: Lower chest: Lung bases demonstrate small moderate right pleural effusion. Passive atelectasis in the right base. Decreased left pleural effusion compared to prior. Cardiomegaly. Trace pericardial effusion or thickening. Gynecomastia Hepatobiliary: Gallstone. No focal hepatic abnormality or biliary dilatation Pancreas: Atrophic.  No inflammation or ductal dilatation Spleen: Normal in size without focal abnormality. Adrenals/Urinary Tract: Adrenal glands are normal. Atrophic native kidneys. Left-sided percutaneous nephrostomy tube remains in place. Thickened bladder wall with decompressed bladder. Stomach/Bowel: Stomach within normal limits. No dilated small bowel. Total colectomy with right abdominal ileostomy. No obstructive features. Vascular/Lymphatic: Moderate aortic atherosclerosis. No aneurysm. Partially visualized AV loop graft in the left groin and lower extremity. Small chronic fluid collection anterior to the femoral vessels. Enlarged tortuous appearing left external iliac vein. No suspicious lymph nodes Reproductive: Prostate appears enlarged. Penile implant with right anterior pelvic reservoir Other:  No free air. No significant ascites. Interim removal of left transgluteal percutaneous drainage catheter. Compared with 02/19/2024, decreased size of presacral fluid collection that contains a few gas bubbles. Residual fluid collection measuring 1.8 cm transverse by 1.8 cm AP by 5.5 cm oblique craniocaudal on series 7, image 65, previously the collection measured 4.3 x 3.4 x 9.4 cm. Musculoskeletal: No  acute or suspicious osseous abnormality. 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 Interim removal of left transgluteal percutaneous drainage catheter. Compared with 02/19/2024, decreased size of presacral fluid and gas containing collection. Residual fluid collection measuring 1.8 x 1.8 x 5.5 cm, previously 4.3 x 3.4 x 9.4 cm. Some left gluteal subcutaneous soft tissue stranding at the site of the prior gluteal catheter but no rim enhancing fluid collections within the left gluteal soft tissues. 2. Small to moderate right pleural effusion with passive atelectasis in the right base. Decreased left pleural effusion compared to prior. 3. Atrophic native kidneys with left-sided percutaneous nephrostomy tube in place. No hydronephrosis. 4. Total colectomy with right abdominal ileostomy. No obstructive features. 5. Aortic atherosclerosis. Aortic Atherosclerosis (ICD10-I70.0). Electronically Signed   By: Luke Bun M.D.   On: 03/14/2024 22:18   DG Chest Portable 1 View Result Date: 03/14/2024 EXAM: 1 VIEW XRAY OF THE CHEST 03/14/2024 09:25:00 PM COMPARISON: 02/22/2024 CLINICAL HISTORY: Flank pain. Drain placed in his pelvis through left lower back hip area several weeks ago, drain came out Sat. Family states site is red and looks infected. Family endorses some confusion over last 2 days. Also endorses 9/10 pain. FINDINGS: LUNGS AND PLEURA: Pulmonary vascular congestion. Small right pleural effusion and associated basilar airspace opacities. No pneumothorax. HEART AND  MEDIASTINUM: Stable cardiomegaly. BONES AND SOFT TISSUES: No acute osseous abnormality. VASCULATURE: Right subclavian stent. IMPRESSION: 1. Small right pleural effusion and associated basilar airspace opacities. 2. Stable cardiomegaly and pulmonary vascular congestion. Electronically signed by: Norman Gatlin MD 03/14/2024 09:33 PM EDT RP Workstation: [id number]     Procedures   Medications Ordered in the ED  vancomycin  (VANCOREADY) IVPB 1500 mg/300 mL (1,500 mg Intravenous New Bag/Given 03/14/24 2348)  lactated ringers  bolus 500 mL (500 mLs Intravenous New Bag/Given 03/14/24 2347)  vancomycin  variable dose per unstable renal function (pharmacist dosing) (has no administration in time range)  sodium chloride  0.9 % bolus 500 mL (0 mLs Intravenous Stopped 03/14/24 2246)  iohexol  (OMNIPAQUE ) 350 MG/ML injection 75 mL (75 mLs Intravenous Contrast Given 03/14/24 2147)  hydrocortisone  sodium succinate  (SOLU-CORTEF ) 100 MG injection 100 mg (100 mg Intravenous Given 03/14/24 2305)  piperacillin -tazobactam (ZOSYN ) IVPB 3.375 g (0 g Intravenous Stopped 03/14/24 2341)                                    Medical Decision Making Amount and/or Complexity of Data Reviewed Labs: ordered. Radiology: ordered.  Risk Prescription drug management. Decision regarding hospitalization.   Patient with end-stage renal disease on dialysis, recent admission discharge August 20 presents after presacral drain removed over the weekend and now has mild confusion and mild drainage from the site.  Medical records reviewed discharge summary on 20 August describing IR placement and hospital stay.  Clinical concern for acute on chronic infection as cause of presentation, other differentials include anemia/metabolic, urine infection, other.  Afebrile here normal white blood cell count.  Hemoglobin stable at 11.  Patient's blood pressure intermittently would drop to the 80s.  Patient at baseline has pressure in the 90s however with  concern for infection discussed with nursing and sepsis screening labs and eventually IV antibiotics started after blood pressure dropped again to the mid 80s.  Discussed with pharmacy/consult for IV antibiotics.  Patient is on chronic steroids, IV steroid bolus ordered.  Discussed with hospitalist for admission and further monitoring.  Plan for IR consult in the morning.  Lactic acid mildly elevated 2.3.  P discussed possible sepsis with nursing staff.  atient had 2 IV fluid bolus ordered holding off on 30 cc/kg due to mild vascular congestion on chest x-ray, renal disease history.       Final diagnoses:  Sepsis associated hypotension [facility])    ED Discharge Orders     None          Tonia Chew, MD 03/14/24 2350

## 2024-03-14 NOTE — Progress Notes (Signed)
 ED Pharmacy Antibiotic Sign Off An antibiotic consult was received from an ED provider for vancomycin  and zosyn  per pharmacy dosing for wound infection. A chart review was completed to assess appropriateness.   The following one time order(s) were placed:  Vancomycin  1500 mg Iv x 1 Zosyn  3.375g IV x 1  Further antibiotic and/or antibiotic pharmacy consults should be ordered by the admitting provider if indicated.   Thank you for allowing pharmacy to be a part of this patient's care.   Dorn Poot, [facility]  Clinical Pharmacist 03/14/24 11:00 PM

## 2024-03-14 NOTE — Telephone Encounter (Signed)
 FYI Only or Action Required?: FYI only for provider.  Patient was last seen in primary care on 02/11/2024 by Katrinka Garnette KIDD, MD.  Called Nurse Triage reporting surgical infection.  Symptoms began several days ago.  Interventions attempted: Nothing.  Symptoms are: redness and inflammation at insertion site, severe pain at insertion site (left lower back), chills, confusion gradually worsening.  Triage Disposition: Go to ED Now (or PCP Triage)  Patient/caregiver understands and will follow disposition?: Yes             Copied from CRM #[id number]. Topic: Clinical - Red Word Triage >> Mar 14, 2024  2:53 PM [place] E wrote: [facility] that prompted transfer to Nurse Triage: Infected drain site on lower back near pelvis. Patient needs antibiotic in order to re-insert drain, but site is infected. Reason for Disposition  SEVERE pain (e.g., excruciating) at drain site  Answer Assessment - Initial Assessment Questions Daughter, [name], and patient on the phone for triage. Daughter states the drainage tubing fell out on Saturday. She states prior to that the palliate nurse visited and mentioned the insertion site for the drain looked infected (red and inflamed). She states they were supposed to have a CT image done of the drain today with radiology but it was cancelled due to infection and they told them to come back after he is treated with antibiotics. She states she called [facility] (who placed the drain) and she was told to go to the ED. Advised ED and daughter is agreeable. She asked if it is recommended whether he go to a freestanding or hospital ED. Advised either would be able to treat him but hospital ED would have services such as radiology which he may need.   1. SYMPTOMS: What is the main symptom you are concerned about? (e.g., drain no longer draining or draining slowly, skin symptoms around the insertion site, drain broken or accidentally fell out)      Drain  accidentally fell out, she states the tubing all came out but the piece is still attached to him.  2. ONSET:  When did the symptoms start?     Drain fell out on Saturday.  3. DRAIN TYPE: What type of drain or drain(s) do you have? (e.g., JP, Hemovac, Penrose)     Unsure, per chart patient has history of had pigtail catheter.  4. DRAIN WHEN: How long have you had the drain?     About 2 weeks.  5. DRAIN WHERE: Where is the drain located?     Left lower back.  6. DRAIN WHY: Why did you have the drain placed? (e.g., surgical procedure, incision and drainage of an abscess)     Chronic presacral abscess.  7. FEVER: Do you have a fever? If Yes, ask: What is the temperature, how was it measured, and when did it start?     No.  8. PAIN: Is there any pain? If Yes, ask: How bad is the pain? (Scale 0-10; or none, mild, moderate, severe) Describe the pain. (e.g., burning, throbbing, shooting, sharp, etc.)     Yes, splitting/shooting pain. Severe.  9. DRAINAGE AMOUNT: When did you last empty the drain? How much drainage was in the drain? (e.g., Xml)  How much drainage is in the collection bulb now? (e.g., Xml) How much drainage is on the gauze pad or dressing? (e.g., few drops, oozing, saturating the dressing, bleeding continues after holding pressure)     No drainage from insertion site or from tubing since  it fell out on Saturday. She states before the drain fell out, it was draining. She states the palliative care nurse saw the site and told her she also thought it looked infected at the insertion site with redness and inflammation.  10. DRAINAGE COLOR: What color is the drainage? (e.g., dark red, pink, light-yellow, cloudy, pus)       Pink.  11. OTHER SYMPTOMS: Do you have any other symptoms? (e.g., fever, shaking chills, weakness)        Denies fever, rectal bleeding, shivering/shaking chills. Daughter states she has noticed confusion over the past couple of  days.  12. MEDICINES: Are you taking any new medicines? (e.g., antibiotics for a post-op infection, blood thinners, pain medicines)       No.  13. PREGNANCY: Is there any chance you are pregnant? When was your last menstrual period?       N/a.  Protocols used: Post-Op Wound Drains and Tubes - Symptoms and Questions-A-AH

## 2024-03-14 NOTE — ED Triage Notes (Signed)
 Drain placed in his pelvis through left lowre back hip area several weeks ago, drain came out Sat.  Family states site is red and looks infected. Fmily endorses some confusion over last 2 days. Also endorses 9/10 pain.

## 2024-03-15 ENCOUNTER — Encounter (HOSPITAL_COMMUNITY): Payer: Self-pay | Admitting: Internal Medicine

## 2024-03-15 DIAGNOSIS — Z7951 Long term (current) use of inhaled steroids: Secondary | ICD-10-CM | POA: Diagnosis not present

## 2024-03-15 DIAGNOSIS — A419 Sepsis, unspecified organism: Secondary | ICD-10-CM | POA: Diagnosis present

## 2024-03-15 DIAGNOSIS — Y848 Other medical procedures as the cause of abnormal reaction of the patient, or of later complication, without mention of misadventure at the time of the procedure: Secondary | ICD-10-CM | POA: Diagnosis present

## 2024-03-15 DIAGNOSIS — Z79899 Other long term (current) drug therapy: Secondary | ICD-10-CM | POA: Diagnosis not present

## 2024-03-15 DIAGNOSIS — R6510 Systemic inflammatory response syndrome (SIRS) of non-infectious origin without acute organ dysfunction: Secondary | ICD-10-CM | POA: Diagnosis present

## 2024-03-15 DIAGNOSIS — J90 Pleural effusion, not elsewhere classified: Secondary | ICD-10-CM | POA: Diagnosis present

## 2024-03-15 DIAGNOSIS — Z7901 Long term (current) use of anticoagulants: Secondary | ICD-10-CM | POA: Diagnosis not present

## 2024-03-15 DIAGNOSIS — Z932 Ileostomy status: Secondary | ICD-10-CM | POA: Diagnosis not present

## 2024-03-15 DIAGNOSIS — N186 End stage renal disease: Secondary | ICD-10-CM | POA: Diagnosis present

## 2024-03-15 DIAGNOSIS — E271 Primary adrenocortical insufficiency: Secondary | ICD-10-CM | POA: Diagnosis present

## 2024-03-15 DIAGNOSIS — K6819 Other retroperitoneal abscess: Secondary | ICD-10-CM | POA: Diagnosis present

## 2024-03-15 DIAGNOSIS — H409 Unspecified glaucoma: Secondary | ICD-10-CM | POA: Diagnosis present

## 2024-03-15 DIAGNOSIS — F32A Depression, unspecified: Secondary | ICD-10-CM | POA: Diagnosis present

## 2024-03-15 DIAGNOSIS — N139 Obstructive and reflux uropathy, unspecified: Secondary | ICD-10-CM | POA: Diagnosis present

## 2024-03-15 DIAGNOSIS — K5190 Ulcerative colitis, unspecified, without complications: Secondary | ICD-10-CM | POA: Diagnosis present

## 2024-03-15 DIAGNOSIS — I480 Paroxysmal atrial fibrillation: Secondary | ICD-10-CM | POA: Diagnosis present

## 2024-03-15 DIAGNOSIS — Z8616 Personal history of COVID-19: Secondary | ICD-10-CM | POA: Diagnosis not present

## 2024-03-15 DIAGNOSIS — Z66 Do not resuscitate: Secondary | ICD-10-CM | POA: Diagnosis present

## 2024-03-15 DIAGNOSIS — Z936 Other artificial openings of urinary tract status: Secondary | ICD-10-CM | POA: Diagnosis not present

## 2024-03-15 DIAGNOSIS — D696 Thrombocytopenia, unspecified: Secondary | ICD-10-CM | POA: Diagnosis present

## 2024-03-15 DIAGNOSIS — T85628A Displacement of other specified internal prosthetic devices, implants and grafts, initial encounter: Secondary | ICD-10-CM | POA: Diagnosis present

## 2024-03-15 DIAGNOSIS — D649 Anemia, unspecified: Secondary | ICD-10-CM | POA: Diagnosis present

## 2024-03-15 DIAGNOSIS — L02212 Cutaneous abscess of back [any part, except buttock]: Secondary | ICD-10-CM | POA: Diagnosis present

## 2024-03-15 DIAGNOSIS — E875 Hyperkalemia: Secondary | ICD-10-CM | POA: Diagnosis not present

## 2024-03-15 DIAGNOSIS — I9589 Other hypotension: Secondary | ICD-10-CM | POA: Diagnosis present

## 2024-03-15 DIAGNOSIS — Z992 Dependence on renal dialysis: Secondary | ICD-10-CM | POA: Diagnosis not present

## 2024-03-15 LAB — BASIC METABOLIC PANEL WITH GFR
Anion gap: 16 — ABNORMAL HIGH (ref 5–15)
BUN: 37 mg/dL — ABNORMAL HIGH (ref 8–23)
CO2: 20 mmol/L — ABNORMAL LOW (ref 22–32)
Calcium: 8.6 mg/dL — ABNORMAL LOW (ref 8.9–10.3)
Chloride: 98 mmol/L (ref 98–111)
Creatinine, Ser: 6.37 mg/dL — ABNORMAL HIGH (ref 0.61–1.24)
GFR, Estimated: 8 mL/min — ABNORMAL LOW (ref >60)
Glucose, Bld: 121 mg/dL — ABNORMAL HIGH (ref 70–99)
Potassium: 5.5 mmol/L — ABNORMAL HIGH (ref 3.5–5.1)
Sodium: 134 mmol/L — ABNORMAL LOW (ref 135–145)

## 2024-03-15 LAB — CBC WITH DIFFERENTIAL/PLATELET
Abs Immature Granulocytes: 0.04 K/uL (ref 0.00–0.07)
Basophils Absolute: 0 K/uL (ref 0.0–0.1)
Basophils Relative: 0 %
Eosinophils Absolute: 0.1 K/uL (ref 0.0–0.5)
Eosinophils Relative: 1 %
HCT: 33.3 % — ABNORMAL LOW (ref 39.0–52.0)
Hemoglobin: 10.3 g/dL — ABNORMAL LOW (ref 13.0–17.0)
Immature Granulocytes: 1 %
Lymphocytes Relative: 4 %
Lymphs Abs: 0.2 K/uL — ABNORMAL LOW (ref 0.7–4.0)
MCH: 36.3 pg — ABNORMAL HIGH (ref 26.0–34.0)
MCHC: 30.9 g/dL (ref 30.0–36.0)
MCV: 117.3 fL — ABNORMAL HIGH (ref 80.0–100.0)
Monocytes Absolute: 0.4 K/uL (ref 0.1–1.0)
Monocytes Relative: 6 %
Neutro Abs: 5.8 K/uL (ref 1.7–7.7)
Neutrophils Relative %: 88 %
Platelets: 144 K/uL — ABNORMAL LOW (ref 150–400)
RBC: 2.84 MIL/uL — ABNORMAL LOW (ref 4.22–5.81)
RDW: 18.5 % — ABNORMAL HIGH (ref 11.5–15.5)
WBC: 6.6 K/uL (ref 4.0–10.5)
nRBC: 0 % (ref 0.0–0.2)

## 2024-03-15 LAB — PROTIME-INR
INR: 1.2 (ref 0.8–1.2)
Prothrombin Time: 15.6 s — ABNORMAL HIGH (ref 11.4–15.2)

## 2024-03-15 LAB — HEPATIC FUNCTION PANEL
ALT: 32 U/L (ref 0–44)
AST: 49 U/L — ABNORMAL HIGH (ref 15–41)
Albumin: 3 g/dL — ABNORMAL LOW (ref 3.5–5.0)
Alkaline Phosphatase: 73 U/L (ref 38–126)
Bilirubin, Direct: 0.4 mg/dL — ABNORMAL HIGH (ref 0.0–0.2)
Indirect Bilirubin: 0.9 mg/dL (ref 0.3–0.9)
Total Bilirubin: 1.3 mg/dL — ABNORMAL HIGH (ref 0.0–1.2)
Total Protein: 5.6 g/dL — ABNORMAL LOW (ref 6.5–8.1)

## 2024-03-15 LAB — HEPATITIS B SURFACE ANTIGEN: Hepatitis B Surface Ag: NONREACTIVE

## 2024-03-15 LAB — APTT: aPTT: 101 s — ABNORMAL HIGH (ref 24–36)

## 2024-03-15 LAB — HEPARIN LEVEL (UNFRACTIONATED): Heparin Unfractionated: 0.7 [IU]/mL (ref 0.30–0.70)

## 2024-03-15 MED ORDER — VITAMIN B-12 1000 MCG PO TABS
1000.0000 ug | ORAL_TABLET | Freq: Every day | ORAL | Status: DC
  Administered 2024-03-15 – 2024-03-18 (×3): 1000 ug via ORAL
  Filled 2024-03-15 (×3): qty 1

## 2024-03-15 MED ORDER — MIDODRINE HCL 5 MG PO TABS
30.0000 mg | ORAL_TABLET | Freq: Three times a day (TID) | ORAL | Status: DC
  Administered 2024-03-15 – 2024-03-18 (×11): 30 mg via ORAL
  Filled 2024-03-15 (×10): qty 6

## 2024-03-15 MED ORDER — SEVELAMER CARBONATE 800 MG PO TABS
1600.0000 mg | ORAL_TABLET | Freq: Three times a day (TID) | ORAL | Status: DC
  Administered 2024-03-15 – 2024-03-18 (×6): 1600 mg via ORAL
  Filled 2024-03-15 (×7): qty 2

## 2024-03-15 MED ORDER — GABAPENTIN 300 MG PO CAPS: 300.0000 mg | ORAL_CAPSULE | Freq: Every day | ORAL | Status: DC | PRN

## 2024-03-15 MED ORDER — SODIUM CHLORIDE 0.9 % IV SOLN: 2.0000 g | INTRAVENOUS | Status: DC

## 2024-03-15 MED ORDER — SERTRALINE HCL 50 MG PO TABS: 25.0000 mg | ORAL_TABLET | Freq: Every day | ORAL | Status: DC

## 2024-03-15 MED ORDER — HYDROCORTISONE SOD SUC (PF) 100 MG IJ SOLR
50.0000 mg | Freq: Two times a day (BID) | INTRAMUSCULAR | Status: DC
  Administered 2024-03-15: 50 mg via INTRAVENOUS
  Filled 2024-03-15: qty 2

## 2024-03-15 MED ORDER — ACETAMINOPHEN 650 MG RE SUPP: 650.0000 mg | Freq: Four times a day (QID) | RECTAL | Status: DC | PRN

## 2024-03-15 MED ORDER — DORZOLAMIDE HCL 2 % OP SOLN
1.0000 [drp] | Freq: Every day | OPHTHALMIC | Status: DC
  Administered 2024-03-15 – 2024-03-17 (×3): 1 [drp] via OPHTHALMIC
  Filled 2024-03-15: qty 10

## 2024-03-15 MED ORDER — CHLORHEXIDINE GLUCONATE CLOTH 2 % EX PADS: 6.0000 | MEDICATED_PAD | Freq: Every day | CUTANEOUS | Status: DC

## 2024-03-15 MED ORDER — FLUTICASONE FUROATE-VILANTEROL 100-25 MCG/ACT IN AEPB
1.0000 | INHALATION_SPRAY | Freq: Every day | RESPIRATORY_TRACT | Status: DC
  Administered 2024-03-15 – 2024-03-18 (×3): 1 via RESPIRATORY_TRACT
  Filled 2024-03-15: qty 28

## 2024-03-15 MED ORDER — TIMOLOL MALEATE 0.5 % OP SOLN
1.0000 [drp] | Freq: Two times a day (BID) | OPHTHALMIC | Status: DC
  Administered 2024-03-15 – 2024-03-18 (×6): 1 [drp] via OPHTHALMIC
  Filled 2024-03-15: qty 5

## 2024-03-15 MED ORDER — RENA-VITE PO TABS
1.0000 | ORAL_TABLET | Freq: Every day | ORAL | Status: DC
  Administered 2024-03-15 – 2024-03-17 (×3): 1 via ORAL
  Filled 2024-03-15 (×4): qty 1

## 2024-03-15 MED ORDER — CALCITRIOL 0.25 MCG PO CAPS: 0.5000 ug | ORAL_CAPSULE | ORAL | Status: DC

## 2024-03-15 MED ORDER — PANTOPRAZOLE SODIUM 40 MG PO TBEC
40.0000 mg | DELAYED_RELEASE_TABLET | Freq: Two times a day (BID) | ORAL | Status: DC
  Administered 2024-03-15 – 2024-03-18 (×6): 40 mg via ORAL
  Filled 2024-03-15 (×6): qty 1

## 2024-03-15 MED ORDER — ACETAMINOPHEN 325 MG PO TABS
650.0000 mg | ORAL_TABLET | Freq: Four times a day (QID) | ORAL | Status: DC | PRN
  Administered 2024-03-16 – 2024-03-17 (×3): 650 mg via ORAL
  Filled 2024-03-15 (×4): qty 2

## 2024-03-15 MED ORDER — SODIUM ZIRCONIUM CYCLOSILICATE 10 G PO PACK
10.0000 g | PACK | Freq: Once | ORAL | Status: AC
  Administered 2024-03-15: 10 g via ORAL
  Filled 2024-03-15: qty 1

## 2024-03-15 MED ORDER — FLUDROCORTISONE ACETATE 0.1 MG PO TABS
0.1000 mg | ORAL_TABLET | Freq: Every day | ORAL | Status: DC
  Administered 2024-03-15 – 2024-03-18 (×3): 0.1 mg via ORAL
  Filled 2024-03-15 (×4): qty 1

## 2024-03-15 MED ORDER — HEPARIN (PORCINE) 25000 UT/250ML-% IV SOLN
1250.0000 [IU]/h | INTRAVENOUS | Status: DC
  Administered 2024-03-15: 1300 [IU]/h via INTRAVENOUS
  Administered 2024-03-16: 1250 [IU]/h via INTRAVENOUS
  Filled 2024-03-15 (×2): qty 250

## 2024-03-15 MED ORDER — PIPERACILLIN-TAZOBACTAM IN DEX 2-0.25 GM/50ML IV SOLN
2.2500 g | Freq: Three times a day (TID) | INTRAVENOUS | Status: DC
  Administered 2024-03-15 – 2024-03-18 (×9): 2.25 g via INTRAVENOUS
  Filled 2024-03-15 (×11): qty 50

## 2024-03-15 MED ORDER — VANCOMYCIN HCL 750 MG/150ML IV SOLN
750.0000 mg | INTRAVENOUS | Status: DC
  Administered 2024-03-16: 750 mg via INTRAVENOUS
  Filled 2024-03-15 (×2): qty 150

## 2024-03-15 MED ORDER — RAMELTEON 8 MG PO TABS
8.0000 mg | ORAL_TABLET | Freq: Every day | ORAL | Status: DC
  Administered 2024-03-15 – 2024-03-17 (×4): 8 mg via ORAL
  Filled 2024-03-15 (×5): qty 1

## 2024-03-15 NOTE — ED Notes (Signed)
 Colostomy bag changed

## 2024-03-15 NOTE — Progress Notes (Signed)
 PHARMACY - ANTICOAGULATION CONSULT NOTE  Pharmacy Consult for heparin  Indication: atrial fibrillation  Allergies  Allergen Reactions   Baclofen Other (See Comments)    Altered mental status, after accidental overdose     Cephalosporins Rash   Ciprofloxacin Itching and Rash   Wound Dressing Adhesive Rash    Patient Measurements:    Vital Signs: Temp: 98 F (36.7 C) (09/09 0322) Temp Source: Oral (09/09 0322) BP: 88/64 (09/09 0530) Pulse Rate: 69 (09/09 0530)  Labs: Recent Labs    03/14/24 1700 03/15/24 0321  HGB 11.1* 10.3*  HCT 36.0* 33.3*  PLT 146* 144*  CREATININE 6.05* 6.37*    Estimated Creatinine Clearance: 8.2 mL/min (A) (by C-G formula based on SCr of 6.37 mg/dL (H)).   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 Last dose 9/8 am. Pharmacy consulted for heparin .    Patient was therapeutic on 1300 units/hr 8/20.   Goal of Therapy:  Heparin  level 0.3-0.7 units/ml aPTT 66-102 seconds Monitor platelets by anticoagulation protocol: Yes   Plan:  Heparin  1300 units/hr, no bolus F/u 8hr aptt/HL F/u aPTT until correlates with heparin  level  Monitor daily aPTT, heparin  level, CBC, signs/symptoms of bleeding   Jinnie Door, PharmD, BCPS, BCCP Clinical Pharmacist  Please check AMION for all [facility] Pharmacy phone numbers After 10:00 PM, call Main Pharmacy [phone]

## 2024-03-15 NOTE — ED Notes (Signed)
 Pt. Moved into a recliner

## 2024-03-15 NOTE — ED Notes (Signed)
 CCMD called.

## 2024-03-15 NOTE — ED Notes (Signed)
 Pt states he rarely makes urine. EDP made aware

## 2024-03-15 NOTE — Consult Note (Signed)
 [facility]  INPATIENT CONSULTATION  Reason for Consultation: ESRD co management Requesting Provider: Dr. Arlice  HPI: Joshua Riley is an 88 y.o. male ESRD on HD MWF, A fib on eliquis , ileostomy, chronic L nephrostomy, Ulcerative colitis, chronic hypotension currently admitted for displaced pre sacral drain and nephrology is consulted for comanagement of ESRD and associated condition.   Presacral drain originally placed 07/2023 became dislodged a few days and there was increased redness and pain prompting presentation to ED.   Imaging showed improving size of fluid collection and the drain may not need to be replaced.  He's been afebrile and no leukocytosis but due to redness has been started on IV antibiotics.  Blood cultures pending.  He has chronic hypotension and is at baseline.   He  had uneventful HD yesterday. AVG in thigh worked fine.  No issues reported.    PMH: Past Medical History:  Diagnosis Date   A-fib (HCC)    Anemia    Arthritis    Cancer (HCC)    Basal cell   COVID-19    2021   Dysrhythmia    Afib-controlled on eliquis    ESRD (end stage renal disease) (HCC) 10/22/2021   Glaucoma 11/18/2021   History of DVT (deep vein thrombosis)    Hydronephrosis    managed wtih a PCN   Idiopathic neuropathy 10/22/2021   lyrica     Ileostomy in place [facility])    Obstructive uropathy    With chronic left nephrostomy   Old retinal detachment, total or subtotal    Orthostatic hypotension 10/22/2021   Sleep apnea    does not need a machine   Stroke (HCC)    Ulcerative colitis (HCC)    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   PSH: Past Surgical History:  Procedure Laterality Date   BASAL CELL CARCINOMA EXCISION     10/23   COLON SURGERY     creation of j pouch     and subsequent takedown of j pouch   EYE SURGERY     IR CATHETER TUBE CHANGE  12/18/2023   IR CATHETER TUBE CHANGE  02/19/2024   IR CATHETER TUBE CHANGE  02/19/2024   IR  NEPHROSTOMY EXCHANGE LEFT  12/10/2021   IR NEPHROSTOMY EXCHANGE LEFT  04/22/2022   IR NEPHROSTOMY EXCHANGE LEFT  07/29/2022   IR NEPHROSTOMY EXCHANGE LEFT  10/28/2022   IR NEPHROSTOMY EXCHANGE LEFT  02/05/2023   IR NEPHROSTOMY EXCHANGE LEFT  05/07/2023   IR NEPHROSTOMY EXCHANGE LEFT  06/25/2023   IR NEPHROSTOMY EXCHANGE LEFT  07/17/2023   IR NEPHROSTOMY EXCHANGE LEFT  10/22/2023   IR RADIOLOGIST EVAL & MGMT  11/25/2023   IR RADIOLOGIST EVAL & MGMT  12/18/2023   IR RADIOLOGIST EVAL & MGMT  02/19/2024   IR THROMBECTOMY AV FISTULA W/THROMBOLYSIS/PTA INC/SHUNT/IMG LEFT Left 02/23/2024   IR US  GUIDE VASC ACCESS LEFT  02/23/2024   LEFT HEART CATH AND CORONARY ANGIOGRAPHY N/A 07/22/2022   Procedure: LEFT HEART CATH AND CORONARY ANGIOGRAPHY;  Surgeon: Swaziland, Peter M, MD;  Location: [facility] INVASIVE CV LAB;  Service: Cardiovascular;  Laterality: N/A;   REVISION OF ARTERIOVENOUS GORETEX GRAFT Left 05/06/2022   Procedure: REDO LEFT THIGH ARTERIOVENOUS 4-7 MM GORETEX GRAFT;  Surgeon: Eliza Lonni RAMAN, MD;  Location: [facility] OR;  Service: Vascular;  Laterality: Left;   SMALL INTESTINE SURGERY     TOTAL COLECTOMY      Past Medical History:  Diagnosis Date   A-fib (HCC)  Anemia    Arthritis    Cancer (HCC)    Basal cell   COVID-19    2021   Dysrhythmia    Afib-controlled on eliquis    ESRD (end stage renal disease) (HCC) 10/22/2021   Glaucoma 11/18/2021   History of DVT (deep vein thrombosis)    Hydronephrosis    managed wtih a PCN   Idiopathic neuropathy 10/22/2021   lyrica     Ileostomy in place [facility])    Obstructive uropathy    With chronic left nephrostomy   Old retinal detachment, total or subtotal    Orthostatic hypotension 10/22/2021   Sleep apnea    does not need a machine   Stroke (HCC)    Ulcerative colitis (HCC)    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    Medications:  I have reviewed the patient's current medications.  Medications Prior to Admission   Medication Sig Dispense Refill   atorvastatin  (LIPITOR) 20 MG tablet Take 1 tablet (20 mg total) by mouth daily. 90 tablet 0   calcitRIOL  (ROCALTROL ) 0.5 MCG capsule Take 1 capsule (0.5 mcg total) by mouth every Monday, Wednesday, and Friday with hemodialysis.     Cyanocobalamin  (VITAMIN B-12 PO) Take 1 capsule by mouth in the morning and at bedtime.     dorzolamide  (TRUSOPT ) 2 % ophthalmic solution Place 1 drop into the right eye at bedtime.     Doxylamine Succinate, Sleep, (SLEEP AID PO) Take 1 Dose by mouth at bedtime. Sleep aid of unknown name. OTC.     ELIQUIS  2.5 MG TABS tablet Take 1 tablet (2.5 mg total) by mouth 2 (two) times daily. 60 tablet 11   gabapentin  (NEURONTIN ) 300 MG capsule Take 300 mg by mouth daily as needed (Pain).     midodrine  (PROAMATINE ) 10 MG tablet Take 3 tablets (30 mg total) by mouth 3 (three) times daily. 270 tablet 1   mometasone -formoterol  (DULERA ) 200-5 MCG/ACT AERO Inhale 2 puffs into the lungs 2 (two) times daily. 1 each 1   multivitamin (RENA-VIT) TABS tablet Take 1 tablet by mouth daily.     Omega Fatty Acids-Vitamins (OMEGA-3 GUMMIES) CHEW Chew 1,200 mg by mouth in the morning and at bedtime.     ramelteon  (ROZEREM ) 8 MG tablet Take 1 tablet (8 mg total) by mouth at bedtime. 30 tablet 5   timolol  (TIMOPTIC ) 0.5 % ophthalmic solution Place 1 drop into both eyes in the morning and at bedtime.     traMADol  (ULTRAM ) 50 MG tablet Take 1 tablet (50 mg total) by mouth every 8 (eight) hours as needed (for pain). (Patient taking differently: Take 50 mg by mouth at bedtime.) 12 tablet 0   TYLENOL  325 MG tablet Take 325-650 mg by mouth every 6 (six) hours as needed for mild pain (pain score 1-3) or headache.     fludrocortisone  (FLORINEF ) 0.1 MG tablet Take 1 tablet (0.1 mg total) by mouth daily. (Patient not taking: Reported on 03/15/2024)     folic acid  (FOLVITE ) 1 MG tablet Take 1 tablet (1 mg total) by mouth daily. (Patient not taking: Reported on 02/22/2024) 30  tablet 0   hydrocortisone  (CORTEF ) 10 MG tablet Take 2 tablets (20 mg total) by mouth daily and take 1 tablet (10 mg total) by mouth every evening. (Patient not taking: Reported on 03/15/2024) 90 tablet 1   hydrocortisone  (CORTEF ) 20 MG tablet Take 20 mg by mouth daily. (Patient not taking: Reported on 03/15/2024)     pantoprazole  (PROTONIX )  40 MG tablet Take 1 tablet (40 mg total) by mouth 2 (two) times daily. (Patient not taking: Reported on 03/15/2024) 60 tablet 1   sertraline  (ZOLOFT ) 50 MG tablet Take 25 mg by mouth at bedtime. (Patient not taking: Reported on 03/15/2024)     sevelamer  carbonate (RENVELA ) 800 MG tablet Take 1,600 mg by mouth 3 (three) times daily with meals. (Patient not taking: Reported on 03/15/2024)     Sodium Chloride  Flush (SALINE FLUSH) 0.9 % SOLN Use 5 mLs by Intracatheter route daily as directed. 150 mL 1    ALLERGIES:   Allergies  Allergen Reactions   Baclofen Other (See Comments)    Altered mental status, after accidental overdose     Cephalosporins Rash   Ciprofloxacin Itching and Rash   Wound Dressing Adhesive Rash    FAM HX: Family History  Problem Relation Age of Onset   Stroke Mother    Cancer Father    Esophageal cancer Brother     Social History:   reports that he quit smoking about 40 years ago. His smoking use included cigarettes. He started smoking about 46 years ago. He has a 12 pack-year smoking history. He has never been exposed to tobacco smoke. He has never used smokeless tobacco. He reports current alcohol use of about 5.0 standard drinks of alcohol per week. He reports that he does not use drugs.  ROS: 12 system ROS neg except per HPI  Blood pressure 92/66, pulse (!) 119, temperature 97.6 F (36.4 C), temperature source Oral, resp. rate 20, SpO2 98%. PHYSICAL EXAM: Gen: elderly, chronically ill but nontoxic  Eyes: EOMI ENT:MMM Neck: supple CV:  RRR on monitor Lungs: normal WOB on RA Extr: no edema, L thigh AVG +t/b Neuro: nonfocal  except hard of hearing Skin: ruddy discoloration of pretibial skin   Results for orders placed or performed during the hospital encounter of 03/14/24 (from the past 48 hours)  Comprehensive metabolic panel     Status: Abnormal   Collection Time: 03/14/24  5:00 PM  Result Value Ref Range   Sodium 138 135 - 145 mmol/L   Potassium 4.6 3.5 - 5.1 mmol/L   Chloride 96 (L) 98 - 111 mmol/L   CO2 25 22 - 32 mmol/L   Glucose, Bld 128 (H) 70 - 99 mg/dL    Comment: Glucose reference range applies only to samples taken after fasting for at least 8 hours.   BUN 29 (H) 8 - 23 mg/dL   Creatinine, Ser 3.94 (H) 0.61 - 1.24 mg/dL   Calcium  9.3 8.9 - 10.3 mg/dL   Total Protein 6.9 6.5 - 8.1 g/dL   Albumin  3.7 3.5 - 5.0 g/dL   AST 24 15 - 41 U/L   ALT 23 0 - 44 U/L   Alkaline Phosphatase 73 38 - 126 U/L   Total Bilirubin 1.2 0.0 - 1.2 mg/dL   GFR, Estimated 9 (L) >60 mL/min    Comment: (NOTE) Calculated using the CKD-EPI Creatinine Equation (2021)    Anion gap 17 (H) 5 - 15    Comment: Performed at [facility], [address] [place], [place] [address]  CBC with Differential     Status: Abnormal   Collection Time: 03/14/24  5:00 PM  Result Value Ref Range   WBC 8.0 4.0 - 10.5 K/uL   RBC 3.07 (L) 4.22 - 5.81 MIL/uL   Hemoglobin 11.1 (L) 13.0 - 17.0 g/dL   HCT 63.9 (L) 60.9 - 47.9 %  MCV 117.3 (H) 80.0 - 100.0 fL   MCH 36.2 (H) 26.0 - 34.0 pg   MCHC 30.8 30.0 - 36.0 g/dL   RDW 81.5 (H) 88.4 - 84.4 %   Platelets 146 (L) 150 - 400 K/uL   nRBC 0.0 0.0 - 0.2 %   Neutrophils Relative % 90 %   Neutro Abs 7.2 1.7 - 7.7 K/uL   Lymphocytes Relative 3 %   Lymphs Abs 0.2 (L) 0.7 - 4.0 K/uL   Monocytes Relative 4 %   Monocytes Absolute 0.3 0.1 - 1.0 K/uL   Eosinophils Relative 1 %   Eosinophils Absolute 0.1 0.0 - 0.5 K/uL   Basophils Relative 2 %   Basophils Absolute 0.2 (H) 0.0 - 0.1 K/uL   WBC Morphology See Note     Comment:  Morphology unremarkable   RBC Morphology MORPHOLOGY  UNREMARKABLE     Comment:  Morphology unremarkable   Smear Review See Note     Comment:  Normal Platelet Morphology Performed at [facility], [address] [place], [place] [address]   I-Stat Lactic Acid, ED     Status: None   Collection Time: 03/14/24  5:27 PM  Result Value Ref Range   Lactic Acid, Venous 1.4 0.5 - 1.9 mmol/L  I-Stat Lactic Acid, ED     Status: Abnormal   Collection Time: 03/14/24  8:00 PM  Result Value Ref Range   Lactic Acid, Venous 2.3 (HH) 0.5 - 1.9 mmol/L   Comment NOTIFIED PHYSICIAN   Culture, blood (x 2)     Status: None (Preliminary result)   Collection Time: 03/14/24 11:25 PM   Specimen: BLOOD  Result Value Ref Range   Specimen Description BLOOD RIGHT ANTECUBITAL    Special Requests      BOTTLES DRAWN AEROBIC AND ANAEROBIC Blood Culture results may not be optimal due to an inadequate volume of blood received in culture bottles   Culture      NO GROWTH < 12 HOURS Performed at [facility], [address] [place], [place] [address]    Report Status PENDING   Culture, blood (x 2)     Status: None (Preliminary result)   Collection Time: 03/14/24 11:27 PM   Specimen: BLOOD LEFT FOREARM  Result Value Ref Range   Specimen Description BLOOD LEFT FOREARM    Special Requests      BOTTLES DRAWN AEROBIC AND ANAEROBIC Blood Culture adequate volume   Culture      NO GROWTH < 12 HOURS Performed at [facility], [address], [place], [place] [address]    Report Status PENDING   I-Stat CG4 Lactic Acid     Status: Abnormal   Collection Time: 03/14/24 11:34 PM  Result Value Ref Range   Lactic Acid, Venous 2.0 (HH) 0.5 - 1.9 mmol/L  Basic metabolic panel     Status: Abnormal   Collection Time: 03/15/24  3:21 AM  Result Value Ref Range   Sodium 134 (L) 135 - 145 mmol/L   Potassium 5.5 (H) 3.5 - 5.1 mmol/L    Comment: HEMOLYSIS AT THIS LEVEL MAY AFFECT RESULT   Chloride 98 98 - 111 mmol/L   CO2 20 (L) 22 - 32 mmol/L   Glucose, Bld 121  (H) 70 - 99 mg/dL    Comment: Glucose reference range applies only to samples taken after fasting for at least 8 hours.   BUN 37 (H) 8 - 23 mg/dL   Creatinine, Ser 3.62 (  H) 0.61 - 1.24 mg/dL   Calcium  8.6 (L) 8.9 - 10.3 mg/dL   GFR, Estimated 8 (L) >60 mL/min    Comment: (NOTE) Calculated using the CKD-EPI Creatinine Equation (2021)    Anion gap 16 (H) 5 - 15    Comment: Performed at [facility], [address], [place], [place] [address]  Hepatic function panel     Status: Abnormal   Collection Time: 03/15/24  3:21 AM  Result Value Ref Range   Total Protein 5.6 (L) 6.5 - 8.1 g/dL   Albumin  3.0 (L) 3.5 - 5.0 g/dL   AST 49 (H) 15 - 41 U/L    Comment: HEMOLYSIS AT THIS LEVEL MAY AFFECT RESULT   ALT 32 0 - 44 U/L    Comment: HEMOLYSIS AT THIS LEVEL MAY AFFECT RESULT   Alkaline Phosphatase 73 38 - 126 U/L   Total Bilirubin 1.3 (H) 0.0 - 1.2 mg/dL    Comment: HEMOLYSIS AT THIS LEVEL MAY AFFECT RESULT   Bilirubin, Direct 0.4 (H) 0.0 - 0.2 mg/dL   Indirect Bilirubin 0.9 0.3 - 0.9 mg/dL    Comment: Performed at [facility], [address] [place], [place] [address]  CBC with Differential/Platelet     Status: Abnormal   Collection Time: 03/15/24  3:21 AM  Result Value Ref Range   WBC 6.6 4.0 - 10.5 K/uL   RBC 2.84 (L) 4.22 - 5.81 MIL/uL   Hemoglobin 10.3 (L) 13.0 - 17.0 g/dL   HCT 66.6 (L) 60.9 - 47.9 %   MCV 117.3 (H) 80.0 - 100.0 fL   MCH 36.3 (H) 26.0 - 34.0 pg   MCHC 30.9 30.0 - 36.0 g/dL   RDW 81.4 (H) 88.4 - 84.4 %   Platelets 144 (L) 150 - 400 K/uL   nRBC 0.0 0.0 - 0.2 %   Neutrophils Relative % 88 %   Neutro Abs 5.8 1.7 - 7.7 K/uL   Lymphocytes Relative 4 %   Lymphs Abs 0.2 (L) 0.7 - 4.0 K/uL   Monocytes Relative 6 %   Monocytes Absolute 0.4 0.1 - 1.0 K/uL   Eosinophils Relative 1 %   Eosinophils Absolute 0.1 0.0 - 0.5 K/uL   Basophils Relative 0 %   Basophils Absolute 0.0 0.0 - 0.1 K/uL   Immature Granulocytes 1 %   Abs Immature Granulocytes 0.04 0.00  - 0.07 K/uL    Comment: Performed at [facility], [address] [place], [place] [address]  Protime-INR     Status: Abnormal   Collection Time: 03/15/24  8:27 AM  Result Value Ref Range   Prothrombin Time 15.6 (H) 11.4 - 15.2 seconds   INR 1.2 0.8 - 1.2    Comment: (NOTE) INR goal varies based on device and disease states. Performed at [facility], [address] [place], [place] [address]   Heparin  level (unfractionated)     Status: None   Collection Time: 03/15/24  1:36 PM  Result Value Ref Range   Heparin  Unfractionated 0.70 0.30 - 0.70 IU/mL    Comment: (NOTE) The clinical reportable range upper limit is being lowered to >1.10 to align with the FDA approved guidance for the current laboratory assay.  If heparin  results are below expected values, and patient dosage has  been confirmed, suggest follow up testing of antithrombin III levels. Performed at [facility], [address] [place], [place] [address]     CT ABDOMEN PELVIS W CONTRAST  Result Date: 03/14/2024 CLINICAL DATA:  Inadvertent left drain removal drain site is red and looks infected increased confusion EXAM: CT ABDOMEN AND PELVIS WITH CONTRAST TECHNIQUE: Multidetector CT imaging of the abdomen and pelvis was performed using the standard protocol following bolus administration of intravenous contrast. RADIATION DOSE REDUCTION: This exam was performed according to the departmental dose-optimization program which includes automated exposure control, adjustment of the mA and/or kV according to patient size and/or use of iterative reconstruction technique. CONTRAST:  75mL OMNIPAQUE  IOHEXOL  350 MG/ML SOLN COMPARISON:  CT 02/24/2024, 02/19/2024, 01/25/2024, exams dating back to 06/11/2023 FINDINGS: Lower chest: Lung bases demonstrate small moderate right pleural effusion. Passive atelectasis in the right base. Decreased left pleural effusion compared to prior. Cardiomegaly. Trace pericardial effusion or  thickening. Gynecomastia Hepatobiliary: Gallstone. No focal hepatic abnormality or biliary dilatation Pancreas: Atrophic.  No inflammation or ductal dilatation Spleen: Normal in size without focal abnormality. Adrenals/Urinary Tract: Adrenal glands are normal. Atrophic native kidneys. Left-sided percutaneous nephrostomy tube remains in place. Thickened bladder wall with decompressed bladder. Stomach/Bowel: Stomach within normal limits. No dilated small bowel. Total colectomy with right abdominal ileostomy. No obstructive features. Vascular/Lymphatic: Moderate aortic atherosclerosis. No aneurysm. Partially visualized AV loop graft in the left groin and lower extremity. Small chronic fluid collection anterior to the femoral vessels. Enlarged tortuous appearing left external iliac vein. No suspicious lymph nodes Reproductive: Prostate appears enlarged. Penile implant with right anterior pelvic reservoir Other: No free air. No significant ascites. Interim removal of left transgluteal percutaneous drainage catheter. Compared with 02/19/2024, decreased size of presacral fluid collection that contains a few gas bubbles. Residual fluid collection measuring 1.8 cm transverse by 1.8 cm AP by 5.5 cm oblique craniocaudal on series 7, image 65, previously the collection measured 4.3 x 3.4 x 9.4 cm. Musculoskeletal: No acute or suspicious osseous abnormality. 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 Interim removal of left transgluteal percutaneous drainage catheter. Compared with 02/19/2024, decreased size of presacral fluid and gas containing collection. Residual fluid collection measuring 1.8 x 1.8 x 5.5 cm, previously 4.3 x 3.4 x 9.4 cm. Some left gluteal subcutaneous soft tissue stranding at the site of the prior gluteal catheter but no rim enhancing fluid collections within the left gluteal soft tissues. 2. Small to moderate right pleural effusion with passive atelectasis in  the right base. Decreased left pleural effusion compared to prior. 3. Atrophic native kidneys with left-sided percutaneous nephrostomy tube in place. No hydronephrosis. 4. Total colectomy with right abdominal ileostomy. No obstructive features. 5. Aortic atherosclerosis. Aortic Atherosclerosis (ICD10-I70.0). Electronically Signed   By: Luke Bun M.D.   On: 03/14/2024 22:18   DG Chest Portable 1 View Result Date: 03/14/2024 EXAM: 1 VIEW XRAY OF THE CHEST 03/14/2024 09:25:00 PM COMPARISON: 02/22/2024 CLINICAL HISTORY: Flank pain. Drain placed in his pelvis through left lower back hip area several weeks ago, drain came out Sat. Family states site is red and looks infected. Family endorses some confusion over last 2 days. Also endorses 9/10 pain. FINDINGS: LUNGS AND PLEURA: Pulmonary vascular congestion. Small right pleural effusion and associated basilar airspace opacities. No pneumothorax. HEART AND MEDIASTINUM: Stable cardiomegaly. BONES AND SOFT TISSUES: No acute osseous abnormality. VASCULATURE: Right subclavian stent. IMPRESSION: 1. Small right pleural effusion and associated basilar airspace opacities. 2. Stable cardiomegaly and pulmonary vascular congestion. Electronically signed by: Norman Gatlin MD 03/14/2024 09:33 PM EDT RP Workstation: [id number]    OP HD: [facility] MWF From 01/31/24 --> 2h  B350   72kg   2K bath  AVG  Heparin  none  Calcitriol  0.5 three times per week Recent post wts 69.3kg x 2 tx  Assessment/Plan  **Presacral fluid collection:  drain dislodged.  Imaging shows collected fairly small now, may or may not need drain replaced.   On IV abx with blood cultures pending.  Afebrile with no leukocytosis.   **ESRD on HD: Plan HD here tomorrow per usual schedule  **Hyperkalemia: K 5.5, renal diet, lokelma  today, HD tomorrow.   **Anemia: Hb 10s, monitor  **Chronic hypotension: on high dose midodrine  and florinef .   **L percutaneous nephrostomy tube: chronic  DNR/DNI  Manuelita DELENA Barters 03/15/2024, 3:24 PM

## 2024-03-15 NOTE — Care Management Obs Status (Signed)
 MEDICARE OBSERVATION STATUS NOTIFICATION   Patient Details  Name: Joshua Riley MRN: [id number] Date of Birth: [birth year]   Medicare Observation Status Notification Given:  Yes  Spoke with patient daughter by phone a copy will be mail to the patient home address.   Quetzal Meany 03/15/2024, 4:20 PM

## 2024-03-15 NOTE — ED Notes (Addendum)
 Updated. Pending IR timeframe and inpt bed assignment. Remains NPO. Pt placed bilateral hearing aids in. Denies sx or complaints.

## 2024-03-15 NOTE — Progress Notes (Signed)
 Pt receives out-pt HD at [facility] on MWF 6:30 am chair time. Will assist as needed.   Randine Mungo Dialysis Navigator [phone]

## 2024-03-15 NOTE — ED Notes (Addendum)
 No changes. Alert, NAD, calm, interactive, resps e/u, speaking in clear complete sentences. VSS. BP remains low, improved. MAP >65. Asymptomatic. Skin W&D. Pending transport to inpt bed.

## 2024-03-15 NOTE — ED Notes (Signed)
 Both hearing aides and cell phone charging at bedside.

## 2024-03-15 NOTE — ED Notes (Signed)
 Phlebotomy at [facility] getting labs

## 2024-03-15 NOTE — Progress Notes (Signed)
 PHARMACY - ANTICOAGULATION CONSULT NOTE  Pharmacy Consult for heparin  Indication: atrial fibrillation  Allergies  Allergen Reactions   Baclofen Other (See Comments)    Altered mental status, after accidental overdose     Cephalosporins Rash   Ciprofloxacin Itching and Rash   Wound Dressing Adhesive Rash    Patient Measurements:    Vital Signs: Temp: 97.6 F (36.4 C) (09/09 1255) Temp Source: Oral (09/09 1255) BP: 92/66 (09/09 1328) Pulse Rate: 119 (09/09 1255)  Labs: Recent Labs    03/14/24 1700 03/15/24 0321 03/15/24 0827 03/15/24 1336  HGB 11.1* 10.3*  --   --   HCT 36.0* 33.3*  --   --   PLT 146* 144*  --   --   APTT  --   --   --  101*  LABPROT  --   --  15.6*  --   INR  --   --  1.2  --   HEPARINUNFRC  --   --   --  0.70  CREATININE 6.05* 6.37*  --   --     CrCl cannot be calculated (Unknown ideal weight.).   Medical History: Past Medical History:  Diagnosis Date   A-fib (HCC)    Anemia    Arthritis    Cancer (HCC)    Basal cell   COVID-19    2021   Dysrhythmia    Afib-controlled on eliquis    ESRD (end stage renal disease) (HCC) 10/22/2021   Glaucoma 11/18/2021   History of DVT (deep vein thrombosis)    Hydronephrosis    managed wtih a PCN   Idiopathic neuropathy 10/22/2021   lyrica     Ileostomy in place [facility])    Obstructive uropathy    With chronic left nephrostomy   Old retinal detachment, total or subtotal    Orthostatic hypotension 10/22/2021   Sleep apnea    does not need a machine   Stroke (HCC)    Ulcerative colitis (HCC)    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 Last dose 9/8 am. Pharmacy consulted for heparin .    Patient was therapeutic on 1300 units/hr 8/20.   PM: aPTT 101 and HL 0.7 - both at upper end of therapeutic range on heparin  1300 units/hr. No issues with the heparin  infusion or bleeding reported  per RN.    Goal of Therapy:  Heparin  level 0.3-0.7 units/ml aPTT 66-102 seconds Monitor platelets by anticoagulation protocol: Yes   Plan:  Decrease heparin  slightly to 1250 units/hr F/u 8hr aptt/HL to be sure correlating F/u aPTT until correlates with heparin  level  Monitor daily aPTT, heparin  level, CBC, signs/symptoms of bleeding   Rocky Slade, PharmD, BCPS Clinical Pharmacist  Please check AMION for all [facility] Pharm[facility] phone numbers After 10:00 PM, call Main Pharmacy [phone]

## 2024-03-15 NOTE — Plan of Care (Signed)
  Problem: Clinical Measurements: Goal: Diagnostic test results will improve Outcome: Progressing Goal: Signs and symptoms of infection will decrease Outcome: Progressing   Problem: Education: Goal: Knowledge of General Education information will improve Description: Including pain rating scale, medication(s)/side effects and non-pharmacologic comfort measures Outcome: Progressing   Problem: Health Behavior/Discharge Planning: Goal: Ability to manage health-related needs will improve Outcome: Progressing

## 2024-03-15 NOTE — ED Notes (Addendum)
 Emptied pts. urostomy bag

## 2024-03-15 NOTE — Progress Notes (Signed)
 PROGRESS NOTE  [name]  DOB: 1935/11/12  PCP: Katrinka Garnette KIDD, MD FMW:[id number]  DOA: 03/14/2024  LOS: 0 days  Hospital Day: 2  Brief narrative: Joshua Riley is a 88 y.o. male with PMH significant for ESRD-HD-MWF, obstructive uropathy with left-sided nephrostomy, sleep apnea, orthostatic hypotension, A-fib on Eliquis , stroke, anemia, h/o DVT, neuropathy, ulcerative colitis s/p colectomy 2011 with ileostomy.  In January 2025, patient had presacral drain placed by IR for chronic pelvic fluid collection.    Recently treated for bowel obstruction in July 2025, subsequently admitted in August for missed dialysis due to clotted access.  Lives at home alone, able to ambulate with a walker. 9/8, patient was brought to the ED from home with complaint of dislodgment of presacral drain 2 days ago leading to redness, worsening pain and concern of infection.  In the ED, patient was afebrile, heart rate in 80s, blood pressure 90s, breathing on room air Initial labs with WC count 8, hemoglobin 11.1, platelet 146, lactic acid 1.4 which later worsened to 2.3 CT abdomen pelvis showed 1. Interim removal of left transgluteal percutaneous drainage catheter. Compared with 02/19/2024, decreased size of presacral fluid and gas containing collection. Residual fluid collection measuring 1.8 x 1.8 x 5.5 cm, previously 4.3 x 3.4 x 9.4 cm. Some left gluteal subcutaneous soft tissue stranding at the site of the prior gluteal catheter but no rim enhancing fluid collections within the left gluteal soft tissues. 2.  Bilateral pleural effusion, R>L 3. Atrophic native kidneys with left-sided percutaneous nephrostomy tube in place. No hydronephrosis.  Blood culture sent. Started on broad-spectrum IV antibiotics Also given stress dose hydrocortisone  Admitted to [facility]  Subjective: Patient was seen and examined this morning. Propped up in bed.  Not in distress.  Remain n.p.o. in anticipation of drain  replacement by IR. No family at bedside Left nephrostomy tube remains in place. Chronically hypotensive without symptoms. Overnight, remains afebrile, heart rate mostly under 100 but blood pressure has been consistently less than 100 as low as 72.  Most recent blood pressure this morning 89/65 Labs this morning showed WC count 6.6, hemoglobin 10.3, platelet 144, sodium 134, potassium 5.5  Assessment and plan: SIRS Chronic pelvic collection Patient had presacral area drain placed in January 2025 and followed by IR since then.  Presented with dislodgment of presacral drain  followed by increased area of redness, pain. No fever, WBC count normal Imaging with improvement in the size of collection but is still 1.8 cm x 5.5 cm size is there. IR has been consulted for potential need of replacement of drain N.p.o. this morning  ESRD-HD-MWF H/o obstructive uropathy s/p left nephrostomy Consulted nephrology.  Hyperkalemia Potassium level elevated at 5.5 this morning. Will give a dose of Lokelma . Recent Labs  Lab 03/14/24 1700 03/15/24 0321  K 4.6 5.5*   H/o A-fib Not on any AV nodal blocking agent. Currently rate controlled Chronically anticoagulated with Eliquis . In anticipation of the procedure, Eliquis  has been held and patient has been placed on heparin  drip.  ??Addison's disease Patient denies any history of Addison's disease.  On chart review, I noted that Addison's disease was added to his chart since May 2025.  However, one of the notes mentions that the test was negative in the past.  At some point, fludrocortisone  was added given chronic hypotension.  Current home med list contains both hydrocortisone  and fludrocortisone  but it seems patient is not taking either. I will resume fludrocortisone  but I do not think he needs hydrocortisone .  Chronic hypotension Patient has been persistently hypotensive since last night with blood pressure hovering close to 90s.  Not tachycardic. I  do not think this is secondary to infection.  Patient seems to have chronic hypotension and is on midodrine . Currently midodrine  30 mg 3 times daily. Also started on fludrocortisone  as mentioned above.  H/o ulcerative colitis S/p ileostomy/colostomy  Chronic anemia Hemoglobin remained above 10  no active bleeding. Continue monitor Recent Labs    04/10/23 1541 07/07/23 1407 07/08/23 0527 07/09/23 0314 07/25/23 0325 07/27/23 1851 09/07/23 0609 09/08/23 0804 11/09/23 1804 11/10/23 0347 11/30/23 0500 12/01/23 0500 02/22/24 0854 02/23/24 0526 02/24/24 0423 03/14/24 1700 03/15/24 0321  HGB 10.9*   < > 8.6*   < > 7.7*   < > 11.2*   < >  --    < > 7.7*   < > 11.6* 10.2* 10.2* 11.1* 10.3*  MCV 119*   < > 126.4*   < > 118.8*   < > 113.6*   < >  --    < > 120.3*   < >  --  119.5* 116.5* 117.3* 117.3*  VITAMINB12  --   --  3,190*  --   --   --  4,157*  --  5,684*  --   --   --   --   --   --   --   --   FOLATE  --   --  12.9  --   --   --  >40.0  --   --   --   --   --   --   --   --   --   --   FERRITIN  --   --   --   --  1,518*  --   --   --   --   --  1,357*  --   --   --   --   --   --   TIBC  --   --   --   --  349  --   --   --   --   --  259  --   --   --   --   --   --   IRON  --   --   --   --  144  --   --   --   --   --  71  --   --   --   --   --   --   RETICCTPCT 2.6  --   --   --   --   --   --   --   --   --   --   --   --   --   --   --   --    < > = values in this interval not displayed.   Thrombocytopenia  Slightly low.  Continue to monitor Recent Labs  Lab 03/14/24 1700 03/15/24 0321  PLT 146* 144*      Mobility: Able to ambulate with a walker.  Encouraged the same.  PT eval ordered PT Orders:   PT Follow up Rec:    Goals of care   Code Status: Limited: Do not attempt resuscitation (DNR) -DNR-LIMITED -Do Not Intubate/DNI      DVT prophylaxis:  Place and maintain sequential compression device Start: 03/15/24 1028   Antimicrobials: Currently on IV  Zosyn  and IV vancomycin  Fluid: None Consultants: IR  Family Communication: None at bedside  Status: Observation Level of care:  Progressive   Patient is from: Home Needs to continue in-hospital care: Pending IR eval Anticipated d/c to: Hopefully home in 2 to 3 days      Diet:  Diet Order             Diet NPO time specified Except for: Sips with Meds  Diet effective now                   Scheduled Meds:  [START ON 03/16/2024] calcitRIOL   0.5 mcg Oral Q M,W,F-HD   vitamin B-12  1,000 mcg Oral Daily   dorzolamide   1 drop Right Eye QHS   fludrocortisone   0.1 mg Oral Daily   fluticasone  furoate-vilanterol  1 puff Inhalation Daily   midodrine   30 mg Oral TID   multivitamin  1 tablet Oral Daily   oxyCODONE   2.5 mg Oral Once   pantoprazole   40 mg Oral BID   ramelteon   8 mg Oral QHS   sevelamer  carbonate  1,600 mg Oral TID WC   sodium zirconium cyclosilicate   10 g Oral Once   timolol   1 drop Both Eyes BID   vancomycin  variable dose per unstable renal function (pharmacist dosing)   Does not apply See admin instructions    PRN meds: acetaminophen  **OR** acetaminophen , gabapentin    Infusions:   heparin  1,300 Units/hr (03/15/24 0740)   piperacillin -tazobactam (ZOSYN )  IV Stopped (03/15/24 9367)    Antimicrobials: Anti-infectives (From admission, onward)    Start     Dose/Rate Route Frequency Ordered Stop   03/15/24 1200  ceFEPIme  (MAXIPIME ) 2 g in sodium chloride  0.9 % 100 mL IVPB  Status:  Discontinued        2 g 200 mL/hr over 30 Minutes Intravenous Every 24 hours 03/15/24 0135 03/15/24 0136   03/15/24 0600  piperacillin -tazobactam (ZOSYN ) IVPB 2.25 g        2.25 g 100 mL/hr over 30 Minutes Intravenous Every 8 hours 03/15/24 0137     03/14/24 2315  vancomycin  (VANCOCIN ) IVPB 1000 mg/200 mL premix  Status:  Discontinued        1,000 mg 200 mL/hr over 60 Minutes Intravenous  Once 03/14/24 2305 03/14/24 2305   03/14/24 2308  vancomycin  variable dose per unstable  renal function (pharmacist dosing)         Does not apply See admin instructions 03/14/24 2308     03/14/24 2300  piperacillin -tazobactam (ZOSYN ) IVPB 3.375 g        3.375 g 100 mL/hr over 30 Minutes Intravenous  Once 03/14/24 2259 03/14/24 2341   03/14/24 2300  vancomycin  (VANCOREADY) IVPB 1500 mg/300 mL        1,500 mg 150 mL/hr over 120 Minutes Intravenous  Once 03/14/24 2259 03/15/24 0309       Objective: Vitals:   03/15/24 0915 03/15/24 0930  BP:  95/78  Pulse: 75 77  Resp: 19 19  Temp:    SpO2: 100% 94%   No intake or output data in the 24 hours ending 03/15/24 1031 There were no vitals filed for this visit. Weight change:  There is no height or weight on file to calculate BMI.   Physical Exam: General exam: Pleasant, elderly Caucasian male.  Not in distress Skin: No rashes, lesions or ulcers. HEENT: Atraumatic, normocephalic, no obvious bleeding Lungs: Clear to auscultation bilaterally,  CVS: S1, S2, no murmur,   GI/Abd: Soft, nontender, nondistended, bowel sound present, ileostomy bag  with semiformed stool.  Has left nephrostomy tube in place draining clear urine CNS: Alert, awake, oriented x 3 Psychiatry: Mood appropriate Extremities: Trace bilateral pedal edema, no calf tenderness,  Data Review: I have personally reviewed the laboratory data and studies available.  F/u labs  Unresulted Labs (From admission, onward)     Start     Ordered   03/16/24 0500  Heparin  level (unfractionated)  Daily,   R      03/15/24 0604   03/16/24 0500  APTT  Daily,   R      03/15/24 0604   03/16/24 0500  Basic metabolic panel with GFR  Tomorrow morning,   R        03/15/24 1028   03/16/24 0500  CBC with Differential/Platelet  Tomorrow morning,   R        03/15/24 1028   03/15/24 1400  APTT  Once-Timed,   TIMED        03/15/24 0604   03/15/24 1400  Heparin  level (unfractionated)  Once-Timed,   TIMED        03/15/24 0604   03/14/24 2056  Urine Culture  ONCE - URGENT,   URGENT        Question:  Indication  Answer:  Altered mental status (if no other cause identified)   03/14/24 2056   03/14/24 2055  Urinalysis, Routine w reflex microscopic -Urine, Clean Catch  ONCE - URGENT,   URGENT       Question:  Specimen Source  Answer:  Urine, Clean Catch   03/14/24 2055            Signed, Chapman Rota, MD [facility] 03/15/2024

## 2024-03-15 NOTE — Progress Notes (Signed)
 Pharmacy Antibiotic Note  Ronne Stefanski is a 88 y.o. male admitted on 03/14/2024 with L buttock wound infection. Drain placed recently but fell out 9/6.  Pharmacy has been consulted for vancomycin , cefepime  dosing. Note patient is on HD, outpatient is MWF but misses sometimes.   Patient grew ESBL Ecoli recently. Discussed with MD who agreed with changing cefepime  to piperacillin nadine.  Plan: F/u HD plan and dose vancomycin  accordingly piperacillin /tazobactam 2.25 g q8hr Monitor cultures, clinical status, renal function, vancomycin  level Narrow abx as able and f/u duration     Temp (24hrs), Avg:98 F (36.7 C), Min:97.7 F (36.5 C), Max:98.2 F (36.8 C)  Recent Labs  Lab 03/14/24 1700 03/14/24 1727 03/14/24 2000 03/14/24 2334  WBC 8.0  --   --   --   CREATININE 6.05*  --   --   --   LATICACIDVEN  --  1.4 2.3* 2.0*    Estimated Creatinine Clearance: 8.6 mL/min (A) (by C-G formula based on SCr of 6.05 mg/dL (H)).    Allergies  Allergen Reactions   Baclofen Other (See Comments)    Altered mental status, after accidental overdose     Cephalosporins Rash   Ciprofloxacin Itching and Rash   Wound Dressing Adhesive Rash    Antimicrobials this admission: Piptazo 9/8 Vanc 9/8 >>   Dose adjustments this admission: N/a  Microbiology results: 9/8 BCx:   8/20 pelvic abscess: Kleb pneumo S- cefaz, ESBL Ecoli S- piptazo, staph lugdunensis, stret constellatus   Thank you for allowing pharmacy to be a part of this patient's care.  Jinnie Door, PharmD, BCPS, BCCP Clinical Pharmacist  Please check AMION for all [facility] phone numbers After 10:00 PM, call Main Pharmacy [phone]

## 2024-03-15 NOTE — ED Notes (Signed)
 Pt daughter Gretta) updated on plan of care

## 2024-03-16 ENCOUNTER — Inpatient Hospital Stay (HOSPITAL_COMMUNITY)

## 2024-03-16 DIAGNOSIS — R6510 Systemic inflammatory response syndrome (SIRS) of non-infectious origin without acute organ dysfunction: Secondary | ICD-10-CM | POA: Diagnosis not present

## 2024-03-16 LAB — CBC WITH DIFFERENTIAL/PLATELET
Abs Immature Granulocytes: 0.02 K/uL (ref 0.00–0.07)
Basophils Absolute: 0 K/uL (ref 0.0–0.1)
Basophils Relative: 1 %
Eosinophils Absolute: 0.3 K/uL (ref 0.0–0.5)
Eosinophils Relative: 4 %
HCT: 33.2 % — ABNORMAL LOW (ref 39.0–52.0)
Hemoglobin: 10.3 g/dL — ABNORMAL LOW (ref 13.0–17.0)
Immature Granulocytes: 0 %
Lymphocytes Relative: 9 %
Lymphs Abs: 0.6 K/uL — ABNORMAL LOW (ref 0.7–4.0)
MCH: 35.9 pg — ABNORMAL HIGH (ref 26.0–34.0)
MCHC: 31 g/dL (ref 30.0–36.0)
MCV: 115.7 fL — ABNORMAL HIGH (ref 80.0–100.0)
Monocytes Absolute: 0.5 K/uL (ref 0.1–1.0)
Monocytes Relative: 8 %
Neutro Abs: 5.5 K/uL (ref 1.7–7.7)
Neutrophils Relative %: 78 %
Platelets: 161 K/uL (ref 150–400)
RBC: 2.87 MIL/uL — ABNORMAL LOW (ref 4.22–5.81)
RDW: 18.4 % — ABNORMAL HIGH (ref 11.5–15.5)
WBC: 7 K/uL (ref 4.0–10.5)
nRBC: 0 % (ref 0.0–0.2)

## 2024-03-16 LAB — HEPATITIS B SURFACE ANTIBODY, QUANTITATIVE: Hep B S AB Quant (Post): 4.6 m[IU]/mL — ABNORMAL LOW

## 2024-03-16 LAB — BASIC METABOLIC PANEL WITH GFR
Anion gap: 15 (ref 5–15)
BUN: 47 mg/dL — ABNORMAL HIGH (ref 8–23)
CO2: 24 mmol/L (ref 22–32)
Calcium: 8.8 mg/dL — ABNORMAL LOW (ref 8.9–10.3)
Chloride: 95 mmol/L — ABNORMAL LOW (ref 98–111)
Creatinine, Ser: 8.21 mg/dL — ABNORMAL HIGH (ref 0.61–1.24)
GFR, Estimated: 6 mL/min — ABNORMAL LOW (ref >60)
Glucose, Bld: 111 mg/dL — ABNORMAL HIGH (ref 70–99)
Potassium: 4.7 mmol/L (ref 3.5–5.1)
Sodium: 134 mmol/L — ABNORMAL LOW (ref 135–145)

## 2024-03-16 LAB — HEPARIN LEVEL (UNFRACTIONATED): Heparin Unfractionated: 0.73 [IU]/mL — ABNORMAL HIGH (ref 0.30–0.70)

## 2024-03-16 LAB — APTT: aPTT: 114 s — ABNORMAL HIGH (ref 24–36)

## 2024-03-16 MED ORDER — HEPARIN (PORCINE) 25000 UT/250ML-% IV SOLN
1150.0000 [IU]/h | INTRAVENOUS | Status: AC
  Administered 2024-03-16 – 2024-03-17 (×2): 1150 [IU]/h via INTRAVENOUS
  Filled 2024-03-16: qty 250

## 2024-03-16 MED ORDER — MIDAZOLAM HCL 2 MG/2ML IJ SOLN
INTRAMUSCULAR | Status: AC | PRN
  Administered 2024-03-16 (×2): .5 mg via INTRAVENOUS

## 2024-03-16 MED ORDER — HEPARIN (PORCINE) 25000 UT/250ML-% IV SOLN: 1150.0000 [IU]/h | INTRAVENOUS | Status: DC

## 2024-03-16 MED ORDER — MIDAZOLAM HCL 2 MG/2ML IJ SOLN
INTRAMUSCULAR | Status: AC
  Filled 2024-03-16: qty 2

## 2024-03-16 MED ORDER — LIDOCAINE HCL (PF) 1 % IJ SOLN: 5.0000 mL | INTRAMUSCULAR | Status: DC | PRN

## 2024-03-16 MED ORDER — ANTICOAGULANT SODIUM CITRATE 4% (200MG/5ML) IV SOLN: 5.0000 mL | Status: DC | PRN

## 2024-03-16 MED ORDER — MIDODRINE HCL 5 MG PO TABS
ORAL_TABLET | ORAL | Status: AC
  Filled 2024-03-16: qty 6

## 2024-03-16 MED ORDER — PENTAFLUOROPROP-TETRAFLUOROETH EX AERO: 1.0000 | INHALATION_SPRAY | CUTANEOUS | Status: DC | PRN

## 2024-03-16 MED ORDER — HYDROCORTISONE SOD SUC (PF) 100 MG IJ SOLR
100.0000 mg | Freq: Three times a day (TID) | INTRAMUSCULAR | Status: DC
  Administered 2024-03-16 – 2024-03-18 (×6): 100 mg via INTRAVENOUS
  Filled 2024-03-16 (×8): qty 2

## 2024-03-16 MED ORDER — ALTEPLASE 2 MG IJ SOLR: 2.0000 mg | Freq: Once | INTRAMUSCULAR | Status: DC | PRN

## 2024-03-16 MED ORDER — HEPARIN SODIUM (PORCINE) 1000 UNIT/ML DIALYSIS: 1000.0000 [IU] | INTRAMUSCULAR | Status: DC | PRN

## 2024-03-16 MED ORDER — LIDOCAINE-PRILOCAINE 2.5-2.5 % EX CREA: 1.0000 | TOPICAL_CREAM | CUTANEOUS | Status: DC | PRN

## 2024-03-16 MED ORDER — FENTANYL CITRATE (PF) 100 MCG/2ML IJ SOLN
INTRAMUSCULAR | Status: AC
  Filled 2024-03-16: qty 2

## 2024-03-16 MED ORDER — LIDOCAINE 1 % OPTIME INJ - NO CHARGE
10.0000 mL | Freq: Once | INTRAMUSCULAR | Status: AC
  Administered 2024-03-16: 10 mL
  Filled 2024-03-16: qty 10

## 2024-03-16 MED ORDER — FENTANYL CITRATE (PF) 100 MCG/2ML IJ SOLN
INTRAMUSCULAR | Status: AC | PRN
  Administered 2024-03-16 (×2): 25 ug via INTRAVENOUS

## 2024-03-16 NOTE — Progress Notes (Signed)
 Diet order changed to regular diet per patient request and verified by MD

## 2024-03-16 NOTE — Progress Notes (Signed)
 Patient to IR at this time.

## 2024-03-16 NOTE — Consult Note (Signed)
 Chief Complaint: Patient was seen in consultation today for recurrent presacral abdominal abscess, with consideration for drainage.  Referring Provider(s): Dr. Redia Cleaver, MD   Supervising Physician: Johann Sieving  Patient Status: [facility] - In-pt  Current Code Status  Limited: Do not attempt resuscitation (DNR) -DNR-LIMITED -Do Not Intubate/DNI - Set by [name], MD at 03/15/2024 0121 (View report)  Question Answer  If pulseless and not breathing No CPR or chest compressions.  In Pre-Arrest Conditions (Patient Is Breathing and Has A Pulse) Do not intubate. Provide all appropriate non-invasive medical interventions. Avoid ICU transfer unless indicated or required.  Consent: Discussion documented in EHR or advanced directives reviewed     Patient currently has DNR order in place. Discussion with the patient and family regarding wishes.  The DNR order is rescinded during the procedure and the patient consents to the use of any resuscitation procedure needed to treat the clinical events that occur.   History of Present Illness: Joshua Riley is a 88 y.o. male  with PMHx notable for recurrent presacral abscess, afib, OSA, CVA, obstructive uropathy s/p nephrostomy drain placement, ileostomy in place, and others as delineated below.  Patient is know to IR service, having most recently undergone transgluteal drain placement on 8/20 by Dr. Vanice for this same presacral abscess. Between multiple hospital systems, this would be this patient's 6th or 7th drain placement for this presacral abscess, per patient's daughter's report. Most recently, patient's drain was accidentally pulled out on 9/8, as noted by EDP.  Per Dr. Aureliano progress note today: SIRS with possible developing sepsis source could be the presacral area with the dislodged drain will keep patient on empiric antibiotics follow cultures stress dose steroids, IR informed to see the patient urgently on  03/16/2024, they have asked him to be kept n.p.o. they will see him shortly, follow cultures.  For now appears hemodynamically stable.  History of A-fib presently on heparin  infusion hold Eliquis  in anticipation of drain placement.   Interventional Radiology was requested for presacral drain placement. Request was reviewed and approved by Dr. Johann. Patient is scheduled for same in IR today.   Patient is alert and laying in bed, calm.  Patient is currently without any significant complaints.  Patient denies any fevers, headache, chest pain, SOB, cough, abdominal pain, nausea, vomiting or bleeding.    Past Medical History:  Diagnosis Date   A-fib (HCC)    Anemia    Arthritis    Cancer (HCC)    Basal cell   COVID-19    2021   Dysrhythmia    Afib-controlled on eliquis    ESRD (end stage renal disease) (HCC) 10/22/2021   Glaucoma 11/18/2021   History of DVT (deep vein thrombosis)    Hydronephrosis    managed wtih a PCN   Idiopathic neuropathy 10/22/2021   lyrica     Ileostomy in place [facility])    Obstructive uropathy    With chronic left nephrostomy   Old retinal detachment, total or subtotal    Orthostatic hypotension 10/22/2021   Sleep apnea    does not need a machine   Stroke (HCC)    Ulcerative colitis (HCC)    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    Past Surgical History:  Procedure Laterality Date   BASAL CELL CARCINOMA EXCISION     10/23   COLON SURGERY     creation of j pouch     and subsequent takedown of j pouch  EYE SURGERY     IR CATHETER TUBE CHANGE  12/18/2023   IR CATHETER TUBE CHANGE  02/19/2024   IR CATHETER TUBE CHANGE  02/19/2024   IR NEPHROSTOMY EXCHANGE LEFT  12/10/2021   IR NEPHROSTOMY EXCHANGE LEFT  04/22/2022   IR NEPHROSTOMY EXCHANGE LEFT  07/29/2022   IR NEPHROSTOMY EXCHANGE LEFT  10/28/2022   IR NEPHROSTOMY EXCHANGE LEFT  02/05/2023   IR NEPHROSTOMY EXCHANGE LEFT  05/07/2023   IR NEPHROSTOMY EXCHANGE LEFT  06/25/2023   IR  NEPHROSTOMY EXCHANGE LEFT  07/17/2023   IR NEPHROSTOMY EXCHANGE LEFT  10/22/2023   IR RADIOLOGIST EVAL & MGMT  11/25/2023   IR RADIOLOGIST EVAL & MGMT  12/18/2023   IR RADIOLOGIST EVAL & MGMT  02/19/2024   IR THROMBECTOMY AV FISTULA W/THROMBOLYSIS/PTA INC/SHUNT/IMG LEFT Left 02/23/2024   IR US  GUIDE VASC ACCESS LEFT  02/23/2024   LEFT HEART CATH AND CORONARY ANGIOGRAPHY N/A 07/22/2022   Procedure: LEFT HEART CATH AND CORONARY ANGIOGRAPHY;  Surgeon: [place]azil[place], Peter M, MD;  Location: [facility] INVASIVE CV LAB;  Service: Cardiovascular;  Laterality: N/A;   REVISION OF ARTERIOVENOUS GORETEX GRAFT Left 05/06/2022   Procedure: REDO LEFT THIGH ARTERIOVENOUS 4-7 MM GORETEX GRAFT;  Surgeon: Eliza Lonni RAMAN, MD;  Location: [facility] OR;  Service: Vascular;  Laterality: Left;   SMALL INTESTINE SURGERY     TOTAL COLECTOMY      Allergies: Baclofen, Cephalosporins, Ciprofloxacin, and Wound dressing adhesive  Medications: Prior to Admission medications   Medication Sig Start Date End Date Taking? Authorizing Provider  atorvastatin  (LIPITOR) 20 MG tablet Take 1 tablet (20 mg total) by mouth daily. 11/14/23  Yes Sebastian Toribio GAILS, MD  calcitRIOL  (ROCALTROL ) 0.5 MCG capsule Take 1 capsule (0.5 mcg total) by mouth every Monday, Wednesday, and Friday with hemodialysis. 12/04/23  Yes Danford, Lonni SQUIBB, MD  Cyanocobalamin  (VITAMIN B-12 PO) Take 1 capsule by mouth in the morning and at bedtime.   Yes [provider]  dorzolamide  (TRUSOPT ) 2 % ophthalmic solution Place 1 drop into the right eye at bedtime. 06/17/23  Yes [provider]  Doxylamine Succinate, Sleep, (SLEEP AID PO) Take 1 Dose by mouth at bedtime. Sleep aid of unknown name. OTC.   Yes [provider]  ELIQUIS  2.5 MG TABS tablet Take 1 tablet (2.5 mg total) by mouth 2 (two) times daily. 02/17/24  Yes Katrinka Garnette KIDD, MD  gabapentin  (NEURONTIN ) 300 MG capsule Take 300 mg by mouth daily as needed (Pain). 09/09/23  Yes [provider]  midodrine  (PROAMATINE ) 10 MG tablet Take 3 tablets (30 mg total) by mouth 3 (three) times daily. 02/03/24  Yes Hunsucker, Donnice SAUNDERS, MD  mometasone -formoterol  (DULERA ) 200-5 MCG/ACT AERO Inhale 2 puffs into the lungs 2 (two) times daily. 09/08/23  Yes Rosario Eland I, MD  multivitamin (RENA-VIT) TABS tablet Take 1 tablet by mouth daily.   Yes [provider]  Omega Fatty Acids-Vitamins (OMEGA-3 GUMMIES) CHEW Chew 1,200 mg by mouth in the morning and at bedtime.   Yes [provider]  ramelteon  (ROZEREM ) 8 MG tablet Take 1 tablet (8 mg total) by mouth at bedtime. 10/27/23  Yes Katrinka Garnette KIDD, MD  timolol  (TIMOPTIC ) 0.5 % ophthalmic solution Place 1 drop into both eyes in the morning and at bedtime.   Yes [provider]  traMADol  (ULTRAM ) 50 MG tablet Take 1 tablet (50 mg total) by mouth every 8 (eight) hours as needed (for pain). Patient taking differently: Take 50 mg by mouth at bedtime. 12/03/23  Yes Danford, Lonni SQUIBB, MD  TYLENOL  325 MG tablet Take 325-650 mg by mouth every 6 (six) hours as needed for mild pain (pain score 1-3) or headache.   Yes [provider]  fludrocortisone  (FLORINEF ) 0.1 MG tablet Take 1 tablet (0.1 mg total) by mouth daily. Patient not taking: Reported on 03/15/2024 12/04/23   Jonel Lonni SQUIBB, MD  folic acid  (FOLVITE ) 1 MG tablet Take 1 tablet (1 mg total) by mouth daily. Patient not taking: Reported on 02/22/2024 07/21/23   Regalado, Owen A, MD  hydrocortisone  (CORTEF ) 10 MG tablet Take 2 tablets (20 mg total) by mouth daily and take 1 tablet (10 mg total) by mouth every evening. Patient not taking: Reported on 03/15/2024 02/11/24   Katrinka Garnette KIDD, MD  hydrocortisone  (CORTEF ) 20 MG tablet Take 20 mg by mouth daily. Patient not taking: Reported on 03/15/2024 03/10/24   [provider]  pantoprazole  (PROTONIX ) 40 MG tablet Take 1 tablet (40 mg total) by mouth 2 (two) times daily. Patient not taking:  Reported on 03/15/2024 11/13/23   Sebastian Toribio GAILS, MD  sertraline  (ZOLOFT ) 50 MG tablet Take 25 mg by mouth at bedtime. Patient not taking: Reported on 03/15/2024    [provider]  sevelamer  carbonate (RENVELA ) 800 MG tablet Take 1,600 mg by mouth 3 (three) times daily with meals. Patient not taking: Reported on 03/15/2024    [provider]  Sodium Chloride  Flush (SALINE FLUSH) 0.9 % SOLN Use 5 mLs by Intracatheter route daily as directed. 11/25/23   Caperilla, Daved SAILOR, PA     Family History  Problem Relation Age of Onset   Stroke Mother    Cancer Father    Esophageal cancer Brother     Social History   Socioeconomic History   Marital status: Widowed    Spouse name: Not on file   Number of children: Not on file   Years of education: Not on file   Highest education level: 12th grade  Occupational History   Not on file  Tobacco Use   Smoking status: Former    Current packs/day: 0.00    Average packs/day: 2.0 packs/day for 6.0 years (12.0 ttl pk-yrs)    Types: Cigarettes    Start date: 16    Quit date: 54    Years since quitting: 40.7    Passive exposure: Never   Smokeless tobacco: Never  Vaping Use   Vaping status: Never Used  Substance and Sexual Activity   Alcohol use: Yes    Alcohol/week: 5.0 standard drinks of alcohol    Types: 5 Shots of liquor per week    Comment: socially   Drug use: Never   Sexual activity: Not Currently  Other Topics Concern   Not on file  Social History Narrative   Widowed- lost wife of 13 years to pancreatic cancer- previously married 43 years. Son in [place] and daughter helping in [place]. 4 grandkids.       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       Hobbies: dinner with family- occasional martini   Social Drivers of Health   Financial Resource Strain: Low Risk  (10/26/2023)   Overall Financial Resource Strain (CARDIA)    Difficulty of Paying Living Expenses: Not hard at all  Food Insecurity:  No Food Insecurity (02/26/2024)   Hunger Vital Sign    Worried About Running Out of Food in the Last Year: Never true    Ran Out of  Food in the Last Year: Never true  Transportation Needs: No Transportation Needs (02/26/2024)   PRAPARE - Administrator, Civil Service (Medical): No    Lack of Transportation (Non-Medical): No  Physical Activity: Insufficiently Active (10/26/2023)   Exercise Vital Sign    Days of Exercise per Week: 1 day    Minutes of Exercise per Session: 10 min  Stress: Stress Concern Present (10/26/2023)   [employer] of Occupational Health - Occupational Stress Questionnaire    Feeling of Stress : To some extent  Social Connections: Patient Declined (02/22/2024)   Social Connection and Isolation Panel    Frequency of Communication with Friends and Family: Patient declined    Frequency of Social Gatherings with Friends and Family: Patient declined    Attends Religious Services: Patient declined    Database administrator or Organizations: Patient declined    Attends Banker Meetings: Patient declined    Marital Status: Patient declined  Recent Concern: Social Connections - Socially Isolated (01/27/2024)   Social Connection and Isolation Panel    Frequency of Communication with Friends and Family: More than three times a week    Frequency of Social Gatherings with Friends and Family: More than three times a week    Attends Religious Services: Never    Database administrator or Organizations: No    Attends Banker Meetings: Never    Marital Status: Widowed    Review of Systems: A 12 point ROS discussed and pertinent positives are indicated in the HPI above.  All other systems are negative.  Vital Signs: BP (!) 78/60   Pulse 78   Temp 97.8 F (36.6 C)   Resp 10   Wt 163 lb 5.8 oz (74.1 kg)   SpO2 98%   BMI 24.84 kg/m   Advance Care Plan: The advanced care place/surrogate decision maker was discussed at the time of visit  and the patient did not wish to discuss or was not able to name a surrogate decision maker or provide an advance care plan.  Physical Exam Vitals reviewed.  Constitutional:      General: He is not in acute distress.    Appearance: Normal appearance.  HENT:     Mouth/Throat:     Mouth: Mucous membranes are dry.  Cardiovascular:     Rate and Rhythm: Normal rate and regular rhythm.     Pulses: Normal pulses.     Heart sounds: Normal heart sounds.  Pulmonary:     Effort: Pulmonary effort is normal.     Breath sounds: Normal breath sounds.  Abdominal:     General: Abdomen is flat.     Palpations: Abdomen is soft.     Comments: Ileostomy/colostomy in place at RLQ.  Musculoskeletal:        General: Normal range of motion.     Cervical back: Normal range of motion.     Comments: Left PCN in place.  Skin:    General: Skin is warm and dry.  Neurological:     Mental Status: He is alert and oriented to person, place, and time.  Psychiatric:        Mood and Affect: Mood normal.        Behavior: Behavior normal.        Thought Content: Thought content normal.        Judgment: Judgment normal.     Imaging: CT ABDOMEN PELVIS W CONTRAST Result Date: 03/14/2024 CLINICAL DATA:  Inadvertent  left drain removal drain site is red and looks infected increased confusion EXAM: CT ABDOMEN AND PELVIS WITH CONTRAST TECHNIQUE: Multidetector CT imaging of the abdomen and pelvis was performed using the standard protocol following bolus administration of intravenous contrast. RADIATION DOSE REDUCTION: This exam was performed according to the departmental dose-optimization program which includes automated exposure control, adjustment of the mA and/or kV according to patient size and/or use of iterative reconstruction technique. CONTRAST:  75mL OMNIPAQUE  IOHEXOL  350 MG/ML SOLN COMPARISON:  CT 02/24/2024, 02/19/2024, 01/25/2024, exams dating back to 06/11/2023 FINDINGS: Lower chest: Lung bases demonstrate small  moderate right pleural effusion. Passive atelectasis in the right base. Decreased left pleural effusion compared to prior. Cardiomegaly. Trace pericardial effusion or thickening. Gynecomastia Hepatobiliary: Gallstone. No focal hepatic abnormality or biliary dilatation Pancreas: Atrophic.  No inflammation or ductal dilatation Spleen: Normal in size without focal abnormality. Adrenals/Urinary Tract: Adrenal glands are normal. Atrophic native kidneys. Left-sided percutaneous nephrostomy tube remains in place. Thickened bladder wall with decompressed bladder. Stomach/Bowel: Stomach within normal limits. No dilated small bowel. Total colectomy with right abdominal ileostomy. No obstructive features. Vascular/Lymphatic: Moderate aortic atherosclerosis. No aneurysm. Partially visualized AV loop graft in the left groin and lower extremity. Small chronic fluid collection anterior to the femoral vessels. Enlarged tortuous appearing left external iliac vein. No suspicious lymph nodes Reproductive: Prostate appears enlarged. Penile implant with right anterior pelvic reservoir Other: No free air. No significant ascites. Interim removal of left transgluteal percutaneous drainage catheter. Compared with 02/19/2024, decreased size of presacral fluid collection that contains a few gas bubbles. Residual fluid collection measuring 1.8 cm transverse by 1.8 cm AP by 5.5 cm oblique craniocaudal on series 7, image 65, previously the collection measured 4.3 x 3.4 x 9.4 cm. Musculoskeletal: No acute or suspicious osseous abnormality. 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 Interim removal of left transgluteal percutaneous drainage catheter. Compared with 02/19/2024, decreased size of presacral fluid and gas containing collection. Residual fluid collection measuring 1.8 x 1.8 x 5.5 cm, previously 4.3 x 3.4 x 9.4 cm. Some left gluteal subcutaneous soft tissue stranding at the site of the prior  gluteal catheter but no rim enhancing fluid collections within the left gluteal soft tissues. 2. Small to moderate right pleural effusion with passive atelectasis in the right base. Decreased left pleural effusion compared to prior. 3. Atrophic native kidneys with left-sided percutaneous nephrostomy tube in place. No hydronephrosis. 4. Total colectomy with right abdominal ileostomy. No obstructive features. 5. Aortic atherosclerosis. Aortic Atherosclerosis (ICD10-I70.0). Electronically Signed   By: Luke Bun M.D.   On: 03/14/2024 22:18   DG Chest Portable 1 View Result Date: 03/14/2024 EXAM: 1 VIEW XRAY OF THE CHEST 03/14/2024 09:25:00 PM COMPARISON: 02/22/2024 CLINICAL HISTORY: Flank pain. Drain placed in his pelvis through left lower back hip area several weeks ago, drain came out Sat. Family states site is red and looks infected. Family endorses some confusion over last 2 days. Also endorses 9/10 pain. FINDINGS: LUNGS AND PLEURA: Pulmonary vascular congestion. Small right pleural effusion and associated basilar airspace opacities. No pneumothorax. HEART AND MEDIASTINUM: Stable cardiomegaly. BONES AND SOFT TISSUES: No acute osseous abnormality. VASCULATURE: Right subclavian stent. IMPRESSION: 1. Small right pleural effusion and associated basilar airspace opacities. 2. Stable cardiomegaly and pulmonary vascular congestion. Electronically signed by: Norman Gatlin MD 03/14/2024 09:33 PM EDT RP Workstation: [id number]   CT ABDOMEN PELVIS W CONTRAST Result Date: 03/04/2024 CLINICAL DATA:  Drain in perirectal region EXAM: CT ABDOMEN  AND PELVIS WITH CONTRAST TECHNIQUE: Multidetector CT imaging of the abdomen and pelvis was performed using the standard protocol following bolus administration of intravenous contrast. RADIATION DOSE REDUCTION: This exam was performed according to the departmental dose-optimization program which includes automated exposure control, adjustment of the mA and/or kV according to  patient size and/or use of iterative reconstruction technique. CONTRAST:  ISOVUE -300 IOPAMIDOL  (ISOVUE -300) INJECTION 61% COMPARISON:  01/25/2024 FINDINGS: Lower chest: Cardiomegaly. Small pericardial effusion. Moderate right, small left pleural effusions and associated atelectasis or consolidation. Hepatobiliary: No solid liver abnormality is seen. Gallstone. No gallbladder wall thickening, or biliary dilatation. Pancreas: Unremarkable. No pancreatic ductal dilatation or surrounding inflammatory changes. Spleen: Normal in size without significant abnormality. Adrenals/Urinary Tract: Adrenal glands are unremarkable. Severely atrophic kidneys. Left-sided percutaneous nephrostomy with formed pigtail in the left renal pelvis. No hydronephrosis. Severely thickened, decompressed urinary bladder wall (series 6, image 103) Stomach/Bowel: Stomach is within normal limits. Status post total colectomy with right lower quadrant end ileostomy. No evidence of bowel wall thickening, distention, or inflammatory changes. Vascular/Lymphatic: Aortic atherosclerosis and vascular calcinosis. No enlarged abdominal or pelvic lymph nodes. Reproductive: Prostatomegaly. Penile implant with right lower quadrant reservoir. Other: Small volume perihepatic ascites. Left-sided transgluteal approach percutaneous pigtail drain catheter is positioned with formed pigtail at, but not within the left aspect of a large presacral fluid collection measuring 9.4 x 4.3 x 3.4 cm (series 6, image 43, series 2, image 67). Musculoskeletal: No acute or significant osseous findings. IMPRESSION: 1. Left-sided transgluteal approach percutaneous pigtail drain catheter is positioned with formed pigtail at, but not within the left aspect of a large presacral fluid collection measuring 9.4 x 4.3 x 3.4 cm. 2. Status post total colectomy with right lower quadrant end ileostomy. 3. Severely atrophic kidneys. Left-sided percutaneous nephrostomy with formed pigtail in  the left renal pelvis. No hydronephrosis. 4. Severely thickened, decompressed urinary bladder wall, consistent with nonspecific cystitis or chronic urinary bladder outlet obstruction. 5. Small volume perihepatic ascites. 6. Moderate right, small left pleural effusions and associated atelectasis or consolidation. 7. Cardiomegaly and small pericardial effusion. Aortic Atherosclerosis (ICD10-I70.0). Electronically Signed   By: Marolyn JONETTA Jaksch M.D.   On: 03/04/2024 07:09   CT GUIDED SOFT TISSUE FLUID DRAIN BY PERC CATH Result Date: 02/24/2024 INDICATION: Recurrent presacral abscess EXAM: CT GUIDED DRAINAGE OF THE RECURRENT PRESACRAL ABSCESS ABSCESS TECHNIQUE: Multidetector CT imaging of the PELVIS was performed following the standard protocol WITHOUT IV contrast. RADIATION DOSE REDUCTION: This exam was performed according to the departmental dose-optimization program which includes automated exposure control, adjustment of the mA and/or kV according to patient size and/or use of iterative reconstruction technique. MEDICATIONS: The patient is currently admitted to the hospital and receiving intravenous antibiotics. The antibiotics were administered within an appropriate time frame prior to the initiation of the procedure. ANESTHESIA/SEDATION: Moderate (conscious) sedation was employed during this procedure. A total of Versed  1.0 mg and Fentanyl  25 mcg was administered intravenously by the radiology nurse. Total intra-service moderate Sedation Time: 5 15 minutes. The patient's level of consciousness and vital signs were monitored continuously by radiology nursing throughout the procedure under my direct supervision. COMPLICATIONS: None immediate. TECHNIQUE: Informed written consent was obtained from the patient after a thorough discussion of the procedural risks, benefits and alternatives. All questions were addressed. Maximal Sterile Barrier Technique was utilized including caps, mask, sterile gowns, sterile gloves,  sterile drape, hand hygiene and skin antiseptic. A timeout was performed prior to the initiation of the procedure. PROCEDURE: Previous imaging reviewed. Patient position  nearly prone. Noncontrast localization CT performed. The recurrent presacral pelvic abscess was localized and marked for a left trans gluteal approach. Under sterile conditions and local anesthesia, the 15 cm 18 gauge access needle was advanced from a right trans gluteal approach into the abscess. Needle position confirmed with CT. Syringe aspiration yielded purulent fluid. Guidewire inserted followed by tract dilatation to insert a 10 [place] drain. Drain catheter position confirmed within the abscess with CT. Catheter secured with a silk suture and a sterile dressing. Syringe aspiration yielded total volume of 60 cc purulent fluid. External suction bulb plaque. No immediate complication. Patient tolerated the procedure well. FINDINGS: Imaging confirms needle placement in the presacral abscess for drain placement IMPRESSION: Successful CT-guided 10 French presacral abscess drain placement. Electronically Signed   By: CHRISTELLA.  Shick M.D.   On: 02/24/2024 14:49   IR US  Guide Vasc Access Left Result Date: 02/23/2024 INDICATION: 88 year old male with end-stage renal disease on hemodialysis via a left thigh arteriovenous graft. EXAM: IR THROMBECTOMY AV FISTULA W/THROMBOLYSIS/PTA INC/SHUNT/IMG LEFT; IR ULTRASOUND GUIDANCE VASC ACCESS LEFT MEDICATIONS: 1000 units heparin ; 2 mg Versed  and 20 mcg fentanyl  administered intravenously by the Radiology nurse. Additionally, 4 mg tPA administered by me directly into the occluded graft. ANESTHESIA/SEDATION: Moderate Sedation Time:  52 minutes The patient's vital signs and level of consciousness were continuously monitored during the procedure by the interventional radiology nurse under my direct supervision. FLUOROSCOPY TIME:  Radiation exposure index: 129 mGy, reference air kerma COMPLICATIONS: None immediate.  PROCEDURE: Informed written consent was obtained from the patient after a thorough discussion of the procedural risks, benefits and alternatives. All questions were addressed. Maximal Sterile Barrier Technique was utilized including caps, mask, sterile gowns, sterile gloves, sterile drape, hand hygiene and skin antiseptic. A timeout was performed prior to the initiation of the procedure. The arteriovenous graft was interrogated with ultrasound and found to completely thrombosed. An image was obtained and stored for the medical record. Local anesthesia was attained by infiltration with 1% lidocaine . A small dermatotomy was made. Under real-time sonographic guidance, the graft was punctured proximally near the arterial anastomosis in antegrade fashion directed toward the venous outflow with a 21 gauge micropuncture needle. Using standard technique, the initial micro needle was exchanged over a 0.018 micro wire for a transitional 4 [place] micro sheath. 4 mg tPA was then instilled through the micro sheath into the arterial limb of the loop graft. A second ultrasound-guided access was then performed. Local anesthesia was attained by infiltration with 1% lidocaine . A small dermatotomy was made. Under real-time sonographic guidance, the graft was punctured in retrograde fashion near the venous anastomosis directed toward the arterial inflow. The micro needle was exchanged over a 0.018 microwire for a transitional 4 [place] micro sheath. 4 mg tPA was then instilled through the micro sheath into the venous limb of the loop graft. Attention was turned back to the antegrade access. A 0.035 Amplatz wire was advanced into the graft. The micro sheath was exchanged for a 6 French vascular sheath. A 5 French angled catheter was introduced over a glidewire and navigated into the left iliac vein. Venography was performed of the central veins. Widely patent left iliac veins and vena cava. No evidence of central stenosis. A pull-back  venogram was then performed. The graft is thrombosed. No definite stenosis at the venous anastomosis. A Bentson wire was advanced into the central veins. The 5 French catheter was removed. A 6 by 80 mm mustang balloon was advanced over the wire and  into the loop graft. Sequential angioplasty was then performed throughout the loop graft macerating the thrombus. Angioplasty was also performed at the venous anastomosis. There was no wasting of the balloon to suggest the presence of an underlying stenosis. The balloon was removed. Attention was turned to the retrograde sheath. This was exchanged over an Amplatz wire for a 7 [place] vascular sheath. The angled Kumpe the catheter and Glidewire were reintroduced through the retrograde sheath and navigated into the left external iliac artery. An arteriogram was performed. The inflow to the graft remains completely occluded. Vascular flow is relatively slow suggesting low cardiac output. A Bentson wire was advanced into the iliac artery. The 5 French catheter was removed. A 5 French Fogarty catheter was advanced. The balloon was inflated and used to pull the arterial plug. This maneuver was repeated several times in till there was restoration of inflow into the graft. Additionally, given difficulty with pulling the arterial plug balloon angioplasty was performed of the arterial anastomosis utilizing a 4 x 40 mm mustang balloon. After this, the Fogarty was able to successfully pull the arterial plug. Follow-up arteriography demonstrates a widely patent arteriovenous graft with brisk flow. No evidence of residual thrombus or stenosis. The access sheaths were removed and hemostasis attained with the assistance of per string sutures. There is an excellent palpable pulse throughout the graft. IMPRESSION: Successful declot of thrombosed left thigh graft. No definite stenosis or inciting reason for thrombosis. However, it does appear that cardiac output is somewhat low. It is  possible the patient became transiently hypotensive or that external compression resulted in thrombosis of the graft. ACCESS: This access remains amenable to future percutaneous interventions as clinically indicated. Electronically Signed   By: Wilkie Lent M.D.   On: 02/23/2024 17:27   IR THROMBECTOMY AV FISTULA W/THROMBOLYSIS/PTA INC/SHUNT/IMG LEFT Result Date: 02/23/2024 INDICATION: 88 year old male with end-stage renal disease on hemodialysis via a left thigh arteriovenous graft. EXAM: IR THROMBECTOMY AV FISTULA W/THROMBOLYSIS/PTA INC/SHUNT/IMG LEFT; IR ULTRASOUND GUIDANCE VASC ACCESS LEFT MEDICATIONS: 1000 units heparin ; 2 mg Versed  and 20 mcg fentanyl  administered intravenously by the Radiology nurse. Additionally, 4 mg tPA administered by me directly into the occluded graft. ANESTHESIA/SEDATION: Moderate Sedation Time:  52 minutes The patient's vital signs and level of consciousness were continuously monitored during the procedure by the interventional radiology nurse under my direct supervision. FLUOROSCOPY TIME:  Radiation exposure index: 129 mGy, reference air kerma COMPLICATIONS: None immediate. PROCEDURE: Informed written consent was obtained from the patient after a thorough discussion of the procedural risks, benefits and alternatives. All questions were addressed. Maximal Sterile Barrier Technique was utilized including caps, mask, sterile gowns, sterile gloves, sterile drape, hand hygiene and skin antiseptic. A timeout was performed prior to the initiation of the procedure. The arteriovenous graft was interrogated with ultrasound and found to completely thrombosed. An image was obtained and stored for the medical record. Local anesthesia was attained by infiltration with 1% lidocaine . A small dermatotomy was made. Under real-time sonographic guidance, the graft was punctured proximally near the arterial anastomosis in antegrade fashion directed toward the venous outflow with a 21 gauge  micropuncture needle. Using standard technique, the initial micro needle was exchanged over a 0.018 micro wire for a transitional 4 [place] micro sheath. 4 mg tPA was then instilled through the micro sheath into the arterial limb of the loop graft. A second ultrasound-guided access was then performed. Local anesthesia was attained by infiltration with 1% lidocaine . A small dermatotomy was made. Under real-time sonographic guidance,  the graft was punctured in retrograde fashion near the venous anastomosis directed toward the arterial inflow. The micro needle was exchanged over a 0.018 microwire for a transitional 4 [place] micro sheath. 4 mg tPA was then instilled through the micro sheath into the venous limb of the loop graft. Attention was turned back to the antegrade access. A 0.035 Amplatz wire was advanced into the graft. The micro sheath was exchanged for a 6 French vascular sheath. A 5 French angled catheter was introduced over a glidewire and navigated into the left iliac vein. Venography was performed of the central veins. Widely patent left iliac veins and vena cava. No evidence of central stenosis. A pull-back venogram was then performed. The graft is thrombosed. No definite stenosis at the venous anastomosis. A Bentson wire was advanced into the central veins. The 5 French catheter was removed. A 6 by 80 mm mustang balloon was advanced over the wire and into the loop graft. Sequential angioplasty was then performed throughout the loop graft macerating the thrombus. Angioplasty was also performed at the venous anastomosis. There was no wasting of the balloon to suggest the presence of an underlying stenosis. The balloon was removed. Attention was turned to the retrograde sheath. This was exchanged over an Amplatz wire for a 7 [place] vascular sheath. The angled Kumpe the catheter and Glidewire were reintroduced through the retrograde sheath and navigated into the left external iliac artery. An arteriogram was  performed. The inflow to the graft remains completely occluded. Vascular flow is relatively slow suggesting low cardiac output. A Bentson wire was advanced into the iliac artery. The 5 French catheter was removed. A 5 French Fogarty catheter was advanced. The balloon was inflated and used to pull the arterial plug. This maneuver was repeated several times in till there was restoration of inflow into the graft. Additionally, given difficulty with pulling the arterial plug balloon angioplasty was performed of the arterial anastomosis utilizing a 4 x 40 mm mustang balloon. After this, the Fogarty was able to successfully pull the arterial plug. Follow-up arteriography demonstrates a widely patent arteriovenous graft with brisk flow. No evidence of residual thrombus or stenosis. The access sheaths were removed and hemostasis attained with the assistance of per string sutures. There is an excellent palpable pulse throughout the graft. IMPRESSION: Successful declot of thrombosed left thigh graft. No definite stenosis or inciting reason for thrombosis. However, it does appear that cardiac output is somewhat low. It is possible the patient became transiently hypotensive or that external compression resulted in thrombosis of the graft. ACCESS: This access remains amenable to future percutaneous interventions as clinically indicated. Electronically Signed   By: Wilkie Lent M.D.   On: 02/23/2024 17:27   DG Chest Port 1 View Result Date: 02/22/2024 CLINICAL DATA:  Clogged dialysis graft in the left groin. EXAM: PORTABLE CHEST 1 VIEW COMPARISON:  01/25/2024 and CT chest 11/09/2023. FINDINGS: Trachea is midline. Heart is enlarged, stable. Trace interstitial prominence and indistinctness with small bilateral pleural effusions. IMPRESSION: Mild congestive heart failure. Electronically Signed   By: Newell Eke M.D.   On: 02/22/2024 08:22   IR Catheter Tube Change Result Date: 02/19/2024 CLINICAL DATA:  Patient with a  history of chronic presacral abscess with drain placement in IR 01/07/2024 EXAM: IR CATHETER TUBE CHANGE COMPARISON:  None Available. CONTRAST:  10 mL Omnipaque  300-administered via the existing percutaneous drain. FLUOROSCOPY TIME:  Radiation exposure index as provided by the fluoroscopic device 91.7 mGy TECHNIQUE: The patient was positioned prone on  the fluoroscopy table. A pre-procedural spot fluoroscopic image was obtained of the presacral/pelvic area and the existing percutaneous drainage catheter. Multiple spot fluoroscopic and radiographic images were obtained following the injection of a small amount of contrast via the existing percutaneous drainage catheter. FINDINGS: Contrast injection confirmed the catheter was not in communication with the presacral abscess. The external portion of the percutaneous drainage catheter was cut and cannulated with a short wire. Under intermittent fluoroscopic guidance the existing percutaneous drainage catheter was removed. A new 8 French mini loop percutaneous drainage catheter was passed over the wire without successful placement into the abscess collection. The catheter was removed and the site was covered with an appropriate dressing. IMPRESSION: Existing percutaneous catheter was retracted and not in communication with the presacral abscess. A drain exchange was attempted but placement into the abscess collection was unsuccessful. The patient will be scheduled early next week for new drain placement under CT guidance with moderate sedation. Electronically Signed   By: Ester Sides M.D.   On: 02/19/2024 16:45   IR Catheter Tube Change Result Date: 02/19/2024 INDICATION: Left percutaneous nephrostomy tube requiring routine exchange. EXAM: FLUOROSCOPIC GUIDED left SIDED NEPHROSTOMY CATHETER EXCHANGE COMPARISON:  IR nephrostomy exchange left 10/22/2023 CONTRAST:  10 mL Isovue -300 administered into the collecting system FLUOROSCOPY TIME:  Radiation exposure index as  provided by the fluoroscopic device 91.7 mGy [place] COMPLICATIONS: None immediate. TECHNIQUE: Informed written consent was obtained from the patient after a discussion of the risks, benefits and alternatives to treatment. Questions regarding the procedure were encouraged and answered. A timeout was performed prior to the initiation of the procedure. The left flank and external portion of existing nephrostomy catheter were prepped and draped in the usual sterile fashion. A sterile drape was applied covering the operative field. Maximum barrier sterile technique with sterile gowns and gloves were used for the procedure. A timeout was performed prior to the initiation of the procedure. A pre procedural spot fluoroscopic image was obtained after contrast was injected via the existing nephrostomy catheter demonstrating appropriate positioning within the renal pelvis. The existing nephrostomy catheter was cut and cannulated with a Benson wire which was coiled within the renal pelvis. Under intermittent fluoroscopic guidance, the existing nephrostomy catheter was exchanged for a new 10 [place] all-purpose drainage catheter. Contrast injection confirmed appropriate positioning within the renal pelvis and a post exchange fluoroscopic image was obtained. The catheter was locked and secured to the skin with a suture. A dressing was placed. The patient tolerated the procedure well without immediate postprocedural complication. FINDINGS: The existing nephrostomy catheter is appropriately positioned and functioning. After successful fluoroscopic guided exchange, the new nephrostomy catheter is coiled and locked within the left renal pelvis. IMPRESSION: Successful fluoroscopic guided exchange of left sided 10.2 French percutaneous nephrostomy catheter. Procedure performed by Warren Dais, NP Electronically Signed   By: Ester Sides M.D.   On: 02/19/2024 16:44   IR Radiologist Eval & Mgmt Result Date: 02/19/2024 EXAM: See EPIC  note CHIEF COMPLAINT: See EPIC note HISTORY OF PRESENT ILLNESS: See EPIC note REVIEW OF SYSTEMS: See EPIC note PHYSICAL EXAMINATION: See EPIC note ASSESSMENT AND PLAN: See EPIC note Electronically Signed   By: Ester Sides M.D.   On: 02/19/2024 16:12    Labs:  CBC: Recent Labs    02/24/24 0423 03/14/24 1700 03/15/24 0321 03/16/24 0404  WBC 5.3 8.0 6.6 7.0  HGB 10.2* 11.1* 10.3* 10.3*  HCT 32.4* 36.0* 33.3* 33.2*  PLT 126* 146* 144* 161    COAGS:  Recent Labs    01/07/24 0841 01/25/24 1854 01/27/24 0024 02/23/24 0526 02/24/24 0423 03/15/24 0827 03/15/24 1336 03/16/24 0404  INR 1.3* 1.3*  --  1.2  --  1.2  --   --   APTT  --   --    < > 124* 74*  --  101* 114*   < > = values in this interval not displayed.    BMP: Recent Labs    02/24/24 1502 03/14/24 1700 03/15/24 0321 03/16/24 0404  NA 132* 138 134* 134*  K 4.8 4.6 5.5* 4.7  CL 94* 96* 98 95*  CO2 23 25 20* 24  GLUCOSE 184* 128* 121* 111*  BUN 54* 29* 37* 47*  CALCIUM  8.4* 9.3 8.6* 8.8*  CREATININE 7.88* 6.05* 6.37* 8.21*  GFRNONAA 6* 9* 8* 6*    LIVER FUNCTION TESTS: Recent Labs    02/22/24 0804 02/23/24 0526 03/14/24 1700 03/15/24 0321  BILITOT 0.8 0.9 1.2 1.3*  AST 21 14* 24 49*  ALT 24 21 23  32  ALKPHOS 85 86 73 73  PROT 6.5 6.3* 6.9 5.6*  ALBUMIN  3.3* 3.4* 3.7 3.0*    TUMOR MARKERS: No results for input(s): AFPTM, CEA, CA199, CHROMGRNA in the last 8760 hours.  Assessment and Plan: Per Dr. Aureliano progress note today: SIRS with possible developing sepsis source could be the presacral area with the dislodged drain will keep patient on empiric antibiotics follow cultures stress dose steroids, IR informed to see the patient urgently on 03/16/2024, they have asked him to be kept n.p.o. they will see him shortly, follow cultures.  For now appears hemodynamically stable.  History of A-fib presently on heparin  infusion hold Eliquis  in anticipation of drain placement.  Patient presents  for tentatively scheduled abdominal fluid collection drain placement in IR today, transgluteal approach.   Patient had notable tenderness from most recent drain, preventing him from sleep at times. He is concerned that the new drain will cause him similar discomfort. Patient has been NPO since midnight.  All labs and medications are within acceptable parameters.  No pertinent allergies.   Risks and benefits discussed with the patient including bleeding, infection, damage to adjacent structures, bowel perforation/fistula connection, and sepsis.  All of the patient's questions were answered, patient is agreeable to proceed. Consent signed and in chart.    Thank you for allowing our service to participate in Joshua Riley 's care.  Electronically Signed: Carlin DELENA Griffon, PA-C   03/16/2024, 1:51 PM      I spent a total of  25 Minutes in face to face in clinical consultation, greater than 50% of which was counseling/coordinating care for recurrent presacral abdominal abscess, with consideration for drainage.

## 2024-03-16 NOTE — Progress Notes (Signed)
 PT Cancellation Note  Patient Details Name: Joshua Riley MRN: [id number] DOB: July 13, 1935   Cancelled Treatment:    Reason Eval/Treat Not Completed: Patient at procedure or test/unavailable (Pt off the floor at HD.)   Kharee Lesesne 03/16/2024, 8:27 AM

## 2024-03-16 NOTE — Progress Notes (Signed)
Patient returns from dialysis at this time 

## 2024-03-16 NOTE — Progress Notes (Signed)
 PHARMACY - ANTICOAGULATION CONSULT NOTE  Pharmacy Consult for heparin  Indication: atrial fibrillation  Allergies  Allergen Reactions   Baclofen Other (See Comments)    Altered mental status, after accidental overdose     Cephalosporins Rash   Ciprofloxacin Itching and Rash   Wound Dressing Adhesive Rash    Patient Measurements: Weight: 74.1 kg (163 lb 5.8 oz)  Vital Signs: Temp: 97.8 F (36.6 C) (09/10 0807) Temp Source: Oral (09/10 1234) BP: 101/78 (09/10 1605) Pulse Rate: 82 (09/10 1605)  Labs: Recent Labs    03/14/24 1700 03/15/24 0321 03/15/24 0827 03/15/24 1336 03/16/24 0404  HGB 11.1* 10.3*  --   --  10.3*  HCT 36.0* 33.3*  --   --  33.2*  PLT 146* 144*  --   --  161  APTT  --   --   --  101* 114*  LABPROT  --   --  15.6*  --   --   INR  --   --  1.2  --   --   HEPARINUNFRC  --   --   --  0.70 0.73*  CREATININE 6.05* 6.37*  --   --  8.21*    Estimated Creatinine Clearance: 6.4 mL/min (A) (by C-G formula based on SCr of 8.21 mg/dL (H)).   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 Last dose 9/8 am. Pharmacy consulted for heparin .    Heparin  level 0.73 is correlating with aPTT 114, both are supratherapeutic on  1250 units/hr. No issues with infusion or bleeding per RN.  PM: heparin  off ~1300 for IR procedure, now s/p pelvic drain placement. Ok per IR to resume heparin  drip now.  Goal of Therapy:  Heparin  level 0.3-0.7 units/ml aPTT 66-102 seconds Monitor platelets by anticoagulation protocol: Yes   Plan:  Resume heparin  at 1150 units/hr  F/u 6hr aPTT/heparin  level - if correlating, can d/c aPTTs Monitor  daily heparin  level, CBC, signs/symptoms of bleeding   Rocky Slade, PharmD, BCPS Clinical Pharmacist  Please check AMION for all [facility] Pharmacy phone numbers After 10:00 PM, call Main Pharmacy [phone]

## 2024-03-16 NOTE — Progress Notes (Signed)
Patient returns from IR at this time

## 2024-03-16 NOTE — Progress Notes (Signed)
 Colostomy wafer and bag changed per patient request using hollister 2 piece with 2 3/4 wafer

## 2024-03-16 NOTE — Progress Notes (Addendum)
 PROGRESS NOTE                                                                                                                                                                                                             Patient Demographics:    Joshua Riley, is a 88 y.o. male, DOB - 1935/10/04, FMW:[id number]  Outpatient Primary MD for the patient is Katrinka Garnette KIDD, MD    LOS - 1  Admit date - 03/14/2024    Chief Complaint  Patient presents with   Wound Check       Brief Narrative (HPI from H&P)   88 y.o. male with history of ESRD on hemodialysis on Monday Wednesday Friday, obstructive uropathy with left-sided nephrostomy, adrenal insufficiency, ulcerative colitis with history of abdominal ileostomy and colostomy, recently treated for bowel obstruction in July 2025 and subsequently admitted in August last month for missed dialysis due to clotted access was referred to the ER after patient's presacral drain got dislodged 2 days ago and patient appeared more confused and also the site looked more erythematous and family was concerned for infection.  Patient states that he has been having worsening pain around the drain site and once the drain got dislodged the pain improved.  Denies any nausea vomiting chest pain shortness of breath or diarrhea.    Subjective:    [name] today has, No headache, No chest pain, No abdominal pain - No Nausea, No new weakness tingling or numbness, no SOB   Assessment  & Plan :    SIRS with possible developing sepsis source could be the presacral area with the dislodged drain will keep patient on empiric antibiotics follow cultures stress dose steroids, IR informed to see the patient urgently on 03/16/2024, they have asked him to be kept n.p.o. they will see him shortly, follow cultures.  For now appears hemodynamically stable. History of A-fib presently on heparin  infusion hold Eliquis  in  anticipation of drain placement. Addison's disease on high doses of midodrine  and Florinef , blood pressure soft, immediately start stress dose steroids. ESRD on hemodialysis with obstructive uropathy and nephrostomy tube in place - MWF schedule, nephrology on board. Anemia likely from renal disease.  Follow CBC. Ulcerative colitis with history of abdominal ileostomy with colostomy.  Appears to be stable. Thrombocytopenia appears to be chronic follow CBC.  Condition - Extremely Guarded  Family Communication  : Daughter updated in detail on 03/16/2024, was upset that IR was not involved on 03/15/2024 and the drain has not been placed yet, told her that I have been taking care of her dad since 7 AM this morning and IR was called promptly at exact 7 AM and case discussed with Dr. Johann, he is on the list for procedure today if he qualifies.  Again she was adamant that the drain should be replaced and was informed that that will be on-call made by IR, if he qualifies for it certainly they will do it.  Code Status : DNR  Consults  : Nephrology, IR  PUD Prophylaxis :  PPI   Procedures  :     CT - 1. Interim removal of left transgluteal percutaneous drainage catheter. Compared with 02/19/2024, decreased size of presacral fluid and gas containing collection. Residual fluid collection measuring 1.8 x 1.8 x 5.5 cm, previously 4.3 x 3.4 x 9.4 cm. Some left gluteal subcutaneous soft tissue stranding at the site of the prior gluteal catheter but no rim enhancing fluid collections within the left gluteal soft tissues. 2. Small to moderate right pleural effusion with passive atelectasis in the right base. Decreased left pleural effusion compared to prior. 3. Atrophic native kidneys with left-sided percutaneous nephrostomy tube in place. No hydronephrosis. 4. Total colectomy with right abdominal ileostomy. No obstructive features. 5. Aortic atherosclerosis.      Disposition Plan  :     Status is: Inpatient   DVT Prophylaxis  :    Place and maintain sequential compression device Start: 03/15/24 1028   Lab Results  Component Value Date   PLT 161 03/16/2024    Diet :  Diet Order             Diet NPO time specified  Diet effective midnight                    Inpatient Medications  Scheduled Meds:  calcitRIOL   0.5 mcg Oral Q M,W,F-HD   Chlorhexidine  Gluconate Cloth  6 each Topical Q0600   vitamin B-12  1,000 mcg Oral Daily   dorzolamide   1 drop Right Eye QHS   fludrocortisone   0.1 mg Oral Daily   fluticasone  furoate-vilanterol  1 puff Inhalation Daily   midodrine   30 mg Oral TID   multivitamin  1 tablet Oral Daily   oxyCODONE   2.5 mg Oral Once   pantoprazole   40 mg Oral BID   ramelteon   8 mg Oral QHS   sevelamer  carbonate  1,600 mg Oral TID WC   timolol   1 drop Both Eyes BID   vancomycin  variable dose per unstable renal function (pharmacist dosing)   Does not apply See admin instructions   Continuous Infusions:  anticoagulant sodium citrate      heparin  1,150 Units/hr (03/16/24 0716)   piperacillin -tazobactam (ZOSYN )  IV Stopped (03/16/24 0538)   vancomycin      PRN Meds:.acetaminophen  **OR** acetaminophen , alteplase , anticoagulant sodium citrate , gabapentin , heparin , lidocaine  (PF), lidocaine -prilocaine , pentafluoroprop-tetrafluoroeth  Antibiotics  :    Anti-infectives (From admission, onward)    Start     Dose/Rate Route Frequency Ordered Stop   03/16/24 1200  vancomycin  (VANCOREADY) IVPB 750 mg/150 mL        750 mg 150 mL/hr over 60 Minutes Intravenous Every M-W-F (Hemodialysis) 03/15/24 1604     03/15/24 1200  ceFEPIme  (MAXIPIME ) 2 g in sodium chloride  0.9 % 100 mL IVPB  Status:  Discontinued        2 g 200 mL/hr over 30 Minutes Intravenous Every 24 hours 03/15/24 0135 03/15/24 0136   03/15/24 0600  piperacillin -tazobactam (ZOSYN ) IVPB 2.25 g        2.25 g 100 mL/hr over 30 Minutes Intravenous Every 8 hours 03/15/24 0137      03/14/24 2315  vancomycin  (VANCOCIN ) IVPB 1000 mg/200 mL premix  Status:  Discontinued        1,000 mg 200 mL/hr over 60 Minutes Intravenous  Once 03/14/24 2305 03/14/24 2305   03/14/24 2308  vancomycin  variable dose per unstable renal function (pharmacist dosing)         Does not apply See admin instructions 03/14/24 2308     03/14/24 2300  piperacillin -tazobactam (ZOSYN ) IVPB 3.375 g        3.375 g 100 mL/hr over 30 Minutes Intravenous  Once 03/14/24 2259 03/14/24 2341   03/14/24 2300  vancomycin  (VANCOREADY) IVPB 1500 mg/300 mL        1,500 mg 150 mL/hr over 120 Minutes Intravenous  Once 03/14/24 2259 03/15/24 0309         Objective:   Vitals:   03/16/24 0819 03/16/24 0830 03/16/24 0900 03/16/24 0930  BP: (S) (!) 85/67 (S) (!) 79/64 (!) 89/64 (!) 83/63  Pulse: 79 83 79 80  Resp: (!) 25 19 20  (!) 21  Temp:      TempSrc:      SpO2: 100% 100% 99% 100%  Weight:        Wt Readings from Last 3 Encounters:  03/16/24 74.1 kg  03/01/24 71.1 kg  02/22/24 72 kg     Intake/Output Summary (Last 24 hours) at 03/16/2024 0950 Last data filed at 03/16/2024 0555 Gross per 24 hour  Intake 527.02 ml  Output 1475 ml  Net -947.98 ml     Physical Exam  Awake Alert, No new F.N deficits, Normal affect .AT,PERRAL Supple Neck, No JVD,   Symmetrical Chest wall movement, Good air movement bilaterally, CTAB RRR,No Gallops,Rubs or new Murmurs,  +ve B.Sounds, Abd Soft, No tenderness,   No Cyanosis, Clubbing or edema      Data Review:    Recent Labs  Lab 03/14/24 1700 03/15/24 0321 03/16/24 0404  WBC 8.0 6.6 7.0  HGB 11.1* 10.3* 10.3*  HCT 36.0* 33.3* 33.2*  PLT 146* 144* 161  MCV 117.3* 117.3* 115.7*  MCH 36.2* 36.3* 35.9*  MCHC 30.8 30.9 31.0  RDW 18.4* 18.5* 18.4*  LYMPHSABS 0.2* 0.2* 0.6*  MONOABS 0.3 0.4 0.5  EOSABS 0.1 0.1 0.3  BASOSABS 0.2* 0.0 0.0    Recent Labs  Lab 03/14/24 1700 03/14/24 1727 03/14/24 2000 03/14/24 2334 03/15/24 0321 03/15/24 0827  03/16/24 0404  NA 138  --   --   --  134*  --  134*  K 4.6  --   --   --  5.5*  --  4.7  CL 96*  --   --   --  98  --  95*  CO2 25  --   --   --  20*  --  24  ANIONGAP 17*  --   --   --  16*  --  15  GLUCOSE 128*  --   --   --  121*  --  111*  BUN 29*  --   --   --  37*  --  47*  CREATININE 6.05*  --   --   --  6.37*  --  8.21*  AST 24  --   --   --  49*  --   --   ALT 23  --   --   --  32  --   --   ALKPHOS 73  --   --   --  73  --   --   BILITOT 1.2  --   --   --  1.3*  --   --   ALBUMIN  3.7  --   --   --  3.0*  --   --   LATICACIDVEN  --  1.4 2.3* 2.0*  --   --   --   INR  --   --   --   --   --  1.2  --   CALCIUM  9.3  --   --   --  8.6*  --  8.8*      Recent Labs  Lab 03/14/24 1700 03/14/24 1727 03/14/24 2000 03/14/24 2334 03/15/24 0321 03/15/24 0827 03/16/24 0404  LATICACIDVEN  --  1.4 2.3* 2.0*  --   --   --   INR  --   --   --   --   --  1.2  --   CALCIUM  9.3  --   --   --  8.6*  --  8.8*    --------------------------------------------------------------------------------------------------------------- Lab Results  Component Value Date   CHOL 76 11/10/2023   HDL 25 (L) 11/10/2023   LDLCALC 15 11/10/2023   TRIG 180 (H) 11/10/2023   CHOLHDL 3.0 11/10/2023    Lab Results  Component Value Date   HGBA1C 5.9 (H) 11/09/2023   No results for input(s): TSH, T4TOTAL, FREET4, T3FREE, THYROIDAB in the last 72 hours. No results for input(s): VITAMINB12, FOLATE, FERRITIN, TIBC, IRON, RETICCTPCT in the last 72 hours. ------------------------------------------------------------------------------------------------------------------ Cardiac Enzymes No results for input(s): CKMB, TROPONINI, MYOGLOBIN in the last 168 hours.  Invalid input(s): CK  Micro Results Recent Results (from the past 240 hours)  Culture, blood (x 2)     Status: None (Preliminary result)   Collection Time: 03/14/24 11:25 PM   Specimen: BLOOD  Result Value Ref Range  Status   Specimen Description BLOOD RIGHT ANTECUBITAL  Final   Special Requests   Final    BOTTLES DRAWN AEROBIC AND ANAEROBIC Blood Culture results may not be optimal due to an inadequate volume of blood received in culture bottles   Culture   Final    NO GROWTH 2 DAYS Performed at [facility], [address] [place], [place] [address]    Report Status PENDING  Incomplete  Culture, blood (x 2)     Status: None (Preliminary result)   Collection Time: 03/14/24 11:27 PM   Specimen: BLOOD LEFT FOREARM  Result Value Ref Range Status   Specimen Description BLOOD LEFT FOREARM  Final   Special Requests   Final    BOTTLES DRAWN AEROBIC AND ANAEROBIC Blood Culture adequate volume   Culture   Final    NO GROWTH 2 DAYS Performed at [facility], [address], [place], [place] [address]    Report Status PENDING  Incomplete    Radiology Report CT ABDOMEN PELVIS W CONTRAST Result Date: 03/14/2024 CLINICAL DATA:  Inadvertent left drain removal drain site is red and looks infected increased confusion EXAM: CT ABDOMEN AND PELVIS WITH CONTRAST TECHNIQUE: Multidetector CT imaging of the abdomen and pelvis was performed using the standard protocol following bolus administration of intravenous contrast. RADIATION  DOSE REDUCTION: This exam was performed according to the departmental dose-optimization program which includes automated exposure control, adjustment of the mA and/or kV according to patient size and/or use of iterative reconstruction technique. CONTRAST:  75mL OMNIPAQUE  IOHEXOL  350 MG/ML SOLN COMPARISON:  CT 02/24/2024, 02/19/2024, 01/25/2024, exams dating back to 06/11/2023 FINDINGS: Lower chest: Lung bases demonstrate small moderate right pleural effusion. Passive atelectasis in the right base. Decreased left pleural effusion compared to prior. Cardiomegaly. Trace pericardial effusion or thickening. Gynecomastia Hepatobiliary: Gallstone. No focal hepatic abnormality or biliary  dilatation Pancreas: Atrophic.  No inflammation or ductal dilatation Spleen: Normal in size without focal abnormality. Adrenals/Urinary Tract: Adrenal glands are normal. Atrophic native kidneys. Left-sided percutaneous nephrostomy tube remains in place. Thickened bladder wall with decompressed bladder. Stomach/Bowel: Stomach within normal limits. No dilated small bowel. Total colectomy with right abdominal ileostomy. No obstructive features. Vascular/Lymphatic: Moderate aortic atherosclerosis. No aneurysm. Partially visualized AV loop graft in the left groin and lower extremity. Small chronic fluid collection anterior to the femoral vessels. Enlarged tortuous appearing left external iliac vein. No suspicious lymph nodes Reproductive: Prostate appears enlarged. Penile implant with right anterior pelvic reservoir Other: No free air. No significant ascites. Interim removal of left transgluteal percutaneous drainage catheter. Compared with 02/19/2024, decreased size of presacral fluid collection that contains a few gas bubbles. Residual fluid collection measuring 1.8 cm transverse by 1.8 cm AP by 5.5 cm oblique craniocaudal on series 7, image 65, previously the collection measured 4.3 x 3.4 x 9.4 cm. Musculoskeletal: No acute or suspicious osseous abnormality. 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 Interim removal of left transgluteal percutaneous drainage catheter. Compared with 02/19/2024, decreased size of presacral fluid and gas containing collection. Residual fluid collection measuring 1.8 x 1.8 x 5.5 cm, previously 4.3 x 3.4 x 9.4 cm. Some left gluteal subcutaneous soft tissue stranding at the site of the prior gluteal catheter but no rim enhancing fluid collections within the left gluteal soft tissues. 2. Small to moderate right pleural effusion with passive atelectasis in the right base. Decreased left pleural effusion compared to prior. 3. Atrophic native  kidneys with left-sided percutaneous nephrostomy tube in place. No hydronephrosis. 4. Total colectomy with right abdominal ileostomy. No obstructive features. 5. Aortic atherosclerosis. Aortic Atherosclerosis (ICD10-I70.0). Electronically Signed   By: Luke Bun M.D.   On: 03/14/2024 22:18   DG Chest Portable 1 View Result Date: 03/14/2024 EXAM: 1 VIEW XRAY OF THE CHEST 03/14/2024 09:25:00 PM COMPARISON: 02/22/2024 CLINICAL HISTORY: Flank pain. Drain placed in his pelvis through left lower back hip area several weeks ago, drain came out Sat. Family states site is red and looks infected. Family endorses some confusion over last 2 days. Also endorses 9/10 pain. FINDINGS: LUNGS AND PLEURA: Pulmonary vascular congestion. Small right pleural effusion and associated basilar airspace opacities. No pneumothorax. HEART AND MEDIASTINUM: Stable cardiomegaly. BONES AND SOFT TISSUES: No acute osseous abnormality. VASCULATURE: Right subclavian stent. IMPRESSION: 1. Small right pleural effusion and associated basilar airspace opacities. 2. Stable cardiomegaly and pulmonary vascular congestion. Electronically signed by: Norman Gatlin MD 03/14/2024 09:33 PM EDT RP Workstation: [id number]     Signature  -   Lavada Stank M.D on 03/16/2024 at 9:50 AM   -  To page go to [email or web address]

## 2024-03-16 NOTE — Evaluation (Signed)
 Physical Therapy Evaluation Patient Details Name: Joshua Riley MRN: [id number] DOB: 04/03/1936 Today's Date: 03/16/2024  History of Present Illness  88 y.o. male was referred to the ER after patient's presacral drain got dislodged 2 days ago and patient appeared more confused and also the site looked more erythematous and family was concerned for infection.  history of ESRD on hemodialysis on Monday Wednesday Friday, obstructive uropathy with left-sided nephrostomy, adrenal insufficiency, ulcerative colitis with history of abdominal ileostomy and colostomy, recently treated for bowel obstruction in July 2025 and subsequently admitted in August last month for missed dialysis due to clotted access.  Clinical Impression  Pt presents with admitting diagnosis above. Session today limited as transport arrived to take patient to IR however was able to stand EOB and take sidesteps with supervision no AD. Pt reports PTA that he was Mod I with RW/SPC/rollator and has a HH aide that comes everyday from 12-4. Pt also reports that he was active with HHPT prior to admission. Anticipate that pt is fairly close to baseline. May benefit from one additional session then sign off. Recommend pt resume HHPT. Pt would benefit from continued mobility with mobility specialist during acute stay.       If plan is discharge home, recommend the following: A little help with walking and/or transfers;A little help with bathing/dressing/bathroom;Assist for transportation;Help with stairs or ramp for entrance   Can travel by private vehicle        Equipment Recommendations None recommended by PT  Recommendations for Other Services       Functional Status Assessment Patient has had a recent decline in their functional status and demonstrates the ability to make significant improvements in function in a reasonable and predictable amount of time.     Precautions / Restrictions Precautions Precautions: Fall Recall of  Precautions/Restrictions: Intact Restrictions Weight Bearing Restrictions Per Provider Order: No      Mobility  Bed Mobility Overal bed mobility: Modified Independent                  Transfers Overall transfer level: Needs assistance Equipment used: None Transfers: Sit to/from Stand Sit to Stand: Supervision           General transfer comment: supervision for safety, no physical assist. Limited to sidesteps due to transport arriving to take patient to IR.    Ambulation/Gait               General Gait Details: Deferred due to pt going to IR  Stairs            Wheelchair Mobility     Tilt Bed    Modified Rankin (Stroke Patients Only)       Balance Overall balance assessment: Mild deficits observed, not formally tested                                           Pertinent Vitals/Pain Pain Assessment Pain Assessment: No/denies pain    Home Living Family/patient expects to be discharged to:: Private residence Living Arrangements: Alone Available Help at Discharge: Family;Available PRN/intermittently Type of Home: Apartment Home Access: Level entry       Home Layout: One level Home Equipment: Wheelchair - Forensic psychologist (2 wheels);Rollator (4 wheels);Shower seat;Hand held shower head Additional Comments: Pt has family 20 mins away (Pulled from previous admission)    Prior Function Prior Level of Function : Independent/Modified  Independent;Driving             Mobility Comments: uses RW/cane/rollator ADLs Comments: has aide 12-4 to help with groceries, laundry, bathing     Extremity/Trunk Assessment   Upper Extremity Assessment Upper Extremity Assessment: Overall WFL for tasks assessed    Lower Extremity Assessment Lower Extremity Assessment: Overall WFL for tasks assessed    Cervical / Trunk Assessment Cervical / Trunk Assessment: Normal  Communication   Communication Communication:  Impaired Factors Affecting Communication: Hearing impaired    Cognition Arousal: Alert Behavior During Therapy: WFL for tasks assessed/performed, Agitated   PT - Cognitive impairments: No apparent impairments                         Following commands: Intact       Cueing Cueing Techniques: Verbal cues     General Comments General comments (skin integrity, edema, etc.): VSS    Exercises     Assessment/Plan    PT Assessment Patient needs continued PT services  PT Problem List Decreased strength;Decreased range of motion;Decreased activity tolerance;Decreased balance;Decreased mobility;Decreased coordination;Decreased knowledge of use of DME;Decreased safety awareness;Decreased knowledge of precautions;Cardiopulmonary status limiting activity       PT Treatment Interventions DME instruction;Gait training;Stair training;Functional mobility training;Therapeutic activities;Therapeutic exercise;Balance training;Neuromuscular re-education;Cognitive remediation    PT Goals (Current goals can be found in the Care Plan section)  Acute Rehab PT Goals Patient Stated Goal: to go home PT Goal Formulation: With patient Time For Goal Achievement: 03/30/24 Potential to Achieve Goals: Good    Frequency Min 1X/week     Co-evaluation               AM-PAC PT 6 Clicks Mobility  Outcome Measure Help needed turning from your back to your side while in a flat bed without using bedrails?: None Help needed moving from lying on your back to sitting on the side of a flat bed without using bedrails?: None Help needed moving to and from a bed to a chair (including a wheelchair)?: A Little Help needed standing up from a chair using your arms (e.g., wheelchair or bedside chair)?: A Little Help needed to walk in hospital room?: A Little Help needed climbing 3-5 steps with a railing? : A Little 6 Click Score: 20    End of Session   Activity Tolerance: Patient tolerated  treatment well Patient left: in bed;with call bell/phone within reach;Other (comment) (Transport arriving to take patient.) Nurse Communication: Mobility status PT Visit Diagnosis: Other abnormalities of gait and mobility (R26.89)    Time: 1430-1441 PT Time Calculation (min) (ACUTE ONLY): 11 min   Charges:   PT Evaluation $PT Eval Low Complexity: 1 Low   PT General Charges $$ ACUTE PT VISIT: 1 Visit         Sueellen NOVAK, PT, DPT Acute Rehab Services [phone]   Kissa Campoy 03/16/2024, 4:02 PM

## 2024-03-16 NOTE — Progress Notes (Signed)
 PHARMACY - ANTICOAGULATION CONSULT NOTE  Pharmacy Consult for heparin  Indication: atrial fibrillation  Allergies  Allergen Reactions   Baclofen Other (See Comments)    Altered mental status, after accidental overdose     Cephalosporins Rash   Ciprofloxacin Itching and Rash   Wound Dressing Adhesive Rash    Patient Measurements:    Vital Signs: Temp: 97.8 F (36.6 C) (09/10 0503) Temp Source: Oral (09/10 0503) BP: 81/54 (09/10 0503) Pulse Rate: 65 (09/10 0503)  Labs: Recent Labs    03/14/24 1700 03/15/24 0321 03/15/24 0827 03/15/24 1336 03/16/24 0404  HGB 11.1* 10.3*  --   --  10.3*  HCT 36.0* 33.3*  --   --  33.2*  PLT 146* 144*  --   --  161  APTT  --   --   --  101* 114*  LABPROT  --   --  15.6*  --   --   INR  --   --  1.2  --   --   HEPARINUNFRC  --   --   --  0.70 0.73*  CREATININE 6.05* 6.37*  --   --  8.21*    CrCl cannot be calculated (Unknown ideal weight.).   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 Last dose 9/8 am. Pharmacy consulted for heparin .    Heparin  level 0.73 is correlating with aPTT 114, both are supratherapeutic on 1250 units/hr. No issues with infusion or bleeding per RN.  Goal of Therapy:   Heparin  level 0.3-0.7 units/ml aPTT 66-102 seconds Monitor platelets by anticoagulation protocol: Yes   Plan:  Hold heparin  30 min and decrease to 1150 units/hr  F/u 6hr heparin  level   Monitor  daily heparin  level, CBC, signs/symptoms of bleeding   Jinnie Door, PharmD, BCPS, BCCP Clinical Pharmacist  Please check AMION for all [facility] Pharmacy phone numbers After 10:00 PM, call Main Pharmacy [phone]

## 2024-03-16 NOTE — Procedures (Signed)
  Procedure:  CT pelvic drain placement 69f Preprocedure diagnosis: The encounter diagnosis was Sepsis associated hypotension (HCC). Postprocedure diagnosis: same EBL:    minimal Complications:   none immediate  See full dictation in [employer].  CHARM Toribio Faes MD Main # [phone] Pager  [phone] Mobile [phone]

## 2024-03-16 NOTE — Progress Notes (Signed)
 [facility] Progress Note   Subjective:   Seen at dialysis - doing fine.  No new issues.  Hopeful for d/c soon  Objective Vitals:   03/16/24 0503 03/16/24 0807 03/16/24 0819 03/16/24 0830  BP: (!) 81/54 (!) 89/62 (S) (!) 85/67 (S) (!) 79/64  Pulse: 65 (!) 55 79 83  Resp: 19 (!) 22 (!) 25 19  Temp: 97.8 F (36.6 C) 97.8 F (36.6 C)    TempSrc: Oral     SpO2: 100% (!) 88% 100% 100%  Weight:  74.1 kg     Physical Exam Gen: elderly, chronically ill but nontoxic  Eyes: EOMI ENT:MMM Neck: supple CV:  RRR on monitor Lungs: normal WOB on RA Extr: no edema, L thigh AVG +t/b Neuro: nonfocal except hard of hearing Skin: ruddy discoloration of pretibial skin  Additional Objective Labs: Basic Metabolic Panel: Recent Labs  Lab 03/14/24 1700 03/15/24 0321 03/16/24 0404  NA 138 134* 134*  K 4.6 5.5* 4.7  CL 96* 98 95*  CO2 25 20* 24  GLUCOSE 128* 121* 111*  BUN 29* 37* 47*  CREATININE 6.05* 6.37* 8.21*  CALCIUM  9.3 8.6* 8.8*   Liver Function Tests: Recent Labs  Lab 03/14/24 1700 03/15/24 0321  AST 24 49*  ALT 23 32  ALKPHOS 73 73  BILITOT 1.2 1.3*  PROT 6.9 5.6*  ALBUMIN  3.7 3.0*   No results for input(s): LIPASE, AMYLASE in the last 168 hours. CBC: Recent Labs  Lab 03/14/24 1700 03/15/24 0321 03/16/24 0404  WBC 8.0 6.6 7.0  NEUTROABS 7.2 5.8 5.5  HGB 11.1* 10.3* 10.3*  HCT 36.0* 33.3* 33.2*  MCV 117.3* 117.3* 115.7*  PLT 146* 144* 161   Blood Culture    Component Value Date/Time   SDES BLOOD LEFT FOREARM 03/14/2024 2327   SPECREQUEST  03/14/2024 2327    BOTTLES DRAWN AEROBIC AND ANAEROBIC Blood Culture adequate volume   CULT  03/14/2024 2327    NO GROWTH < 12 HOURS Performed at [facility], [address], [place], [place] [address]    REPTSTATUS PENDING 03/14/2024 2327    Cardiac Enzymes: No results for input(s): CKTOTAL, CKMB, CKMBINDEX, TROPONINI in the last 168 hours. CBG: No results for input(s):  GLUCAP in the last 168 hours. Iron Studies: No results for input(s): IRON, TIBC, TRANSFERRIN, FERRITIN in the last 72 hours. @lablastinr3 @ Studies/Results: CT ABDOMEN PELVIS W CONTRAST Result Date: 03/14/2024 CLINICAL DATA:  Inadvertent left drain removal drain site is red and looks infected increased confusion EXAM: CT ABDOMEN AND PELVIS WITH CONTRAST TECHNIQUE: Multidetector CT imaging of the abdomen and pelvis was performed using the standard protocol following bolus administration of intravenous contrast. RADIATION DOSE REDUCTION: This exam was performed according to the departmental dose-optimization program which includes automated exposure control, adjustment of the mA and/or kV according to patient size and/or use of iterative reconstruction technique. CONTRAST:  75mL OMNIPAQUE  IOHEXOL  350 MG/ML SOLN COMPARISON:  CT 02/24/2024, 02/19/2024, 01/25/2024, exams dating back to 06/11/2023 FINDINGS: Lower chest: Lung bases demonstrate small moderate right pleural effusion. Passive atelectasis in the right base. Decreased left pleural effusion compared to prior. Cardiomegaly. Trace pericardial effusion or thickening. Gynecomastia Hepatobiliary: Gallstone. No focal hepatic abnormality or biliary dilatation Pancreas: Atrophic.  No inflammation or ductal dilatation Spleen: Normal in size without focal abnormality. Adrenals/Urinary Tract: Adrenal glands are normal. Atrophic native kidneys. Left-sided percutaneous nephrostomy tube remains in place. Thickened bladder wall with decompressed bladder. Stomach/Bowel: Stomach within normal limits. No dilated small bowel. Total colectomy  with right abdominal ileostomy. No obstructive features. Vascular/Lymphatic: Moderate aortic atherosclerosis. No aneurysm. Partially visualized AV loop graft in the left groin and lower extremity. Small chronic fluid collection anterior to the femoral vessels. Enlarged tortuous appearing left external iliac vein. No suspicious  lymph nodes Reproductive: Prostate appears enlarged. Penile implant with right anterior pelvic reservoir Other: No free air. No significant ascites. Interim removal of left transgluteal percutaneous drainage catheter. Compared with 02/19/2024, decreased size of presacral fluid collection that contains a few gas bubbles. Residual fluid collection measuring 1.8 cm transverse by 1.8 cm AP by 5.5 cm oblique craniocaudal on series 7, image 65, previously the collection measured 4.3 x 3.4 x 9.4 cm. Musculoskeletal: No acute or suspicious osseous abnormality. 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 Interim removal of left transgluteal percutaneous drainage catheter. Compared with 02/19/2024, decreased size of presacral fluid and gas containing collection. Residual fluid collection measuring 1.8 x 1.8 x 5.5 cm, previously 4.3 x 3.4 x 9.4 cm. Some left gluteal subcutaneous soft tissue stranding at the site of the prior gluteal catheter but no rim enhancing fluid collections within the left gluteal soft tissues. 2. Small to moderate right pleural effusion with passive atelectasis in the right base. Decreased left pleural effusion compared to prior. 3. Atrophic native kidneys with left-sided percutaneous nephrostomy tube in place. No hydronephrosis. 4. Total colectomy with right abdominal ileostomy. No obstructive features. 5. Aortic atherosclerosis. Aortic Atherosclerosis (ICD10-I70.0). Electronically Signed   By: Luke Bun M.D.   On: 03/14/2024 22:18   DG Chest Portable 1 View Result Date: 03/14/2024 EXAM: 1 VIEW XRAY OF THE CHEST 03/14/2024 09:25:00 PM COMPARISON: 02/22/2024 CLINICAL HISTORY: Flank pain. Drain placed in his pelvis through left lower back hip area several weeks ago, drain came out Sat. Family states site is red and looks infected. Family endorses some confusion over last 2 days. Also endorses 9/10 pain. FINDINGS: LUNGS AND PLEURA: Pulmonary vascular  congestion. Small right pleural effusion and associated basilar airspace opacities. No pneumothorax. HEART AND MEDIASTINUM: Stable cardiomegaly. BONES AND SOFT TISSUES: No acute osseous abnormality. VASCULATURE: Right subclavian stent. IMPRESSION: 1. Small right pleural effusion and associated basilar airspace opacities. 2. Stable cardiomegaly and pulmonary vascular congestion. Electronically signed by: Norman Gatlin MD 03/14/2024 09:33 PM EDT RP Workstation: [id number]   Medications:  anticoagulant sodium citrate      heparin  1,150 Units/hr (03/16/24 0716)   piperacillin -tazobactam (ZOSYN )  IV Stopped (03/16/24 0538)   vancomycin       calcitRIOL   0.5 mcg Oral Q M,W,F-HD   Chlorhexidine  Gluconate Cloth  6 each Topical Q0600   vitamin B-12  1,000 mcg Oral Daily   dorzolamide   1 drop Right Eye QHS   fludrocortisone   0.1 mg Oral Daily   fluticasone  furoate-vilanterol  1 puff Inhalation Daily   midodrine   30 mg Oral TID   multivitamin  1 tablet Oral Daily   oxyCODONE   2.5 mg Oral Once   pantoprazole   40 mg Oral BID   ramelteon   8 mg Oral QHS   sevelamer  carbonate  1,600 mg Oral TID WC   timolol   1 drop Both Eyes BID   vancomycin  variable dose per unstable renal function (pharmacist dosing)   Does not apply See admin instructions    OP HD: [facility] MWF From 01/31/24 --> 2h  B350   72kg   2K bath  AVG  Heparin  none  Calcitriol  0.5 three times per week Recent post wts 69.3kg x  2 tx   Assessment/Plan   **Presacral fluid collection:  drain dislodged.  Imaging shows collected fairly small now, may or may not need drain replaced.   On IV abx with blood cultures pending.  Afebrile with no leukocytosis.    **ESRD on HD: Plan HD here today per usual schedule   **Hyperkalemia: resolved with lokelma  x 1 dose. 2K dialysate today.   **Anemia: Hb 10s, monitor   **Chronic hypotension: on high dose midodrine  and florinef .    **L percutaneous nephrostomy tube: chronic   DNR/DNI  Manuelita Barters  MD 03/16/2024, 8:36 AM  [facility] Pager: [phone]

## 2024-03-16 NOTE — Progress Notes (Signed)
 Patient to dialysis at this time.

## 2024-03-17 DIAGNOSIS — R6510 Systemic inflammatory response syndrome (SIRS) of non-infectious origin without acute organ dysfunction: Secondary | ICD-10-CM | POA: Diagnosis not present

## 2024-03-17 LAB — CBC WITH DIFFERENTIAL/PLATELET
Abs Immature Granulocytes: 0.04 K/uL (ref 0.00–0.07)
Basophils Absolute: 0 K/uL (ref 0.0–0.1)
Basophils Relative: 0 %
Eosinophils Absolute: 0.1 K/uL (ref 0.0–0.5)
Eosinophils Relative: 1 %
HCT: 34.1 % — ABNORMAL LOW (ref 39.0–52.0)
Hemoglobin: 10.8 g/dL — ABNORMAL LOW (ref 13.0–17.0)
Immature Granulocytes: 1 %
Lymphocytes Relative: 5 %
Lymphs Abs: 0.4 K/uL — ABNORMAL LOW (ref 0.7–4.0)
MCH: 36 pg — ABNORMAL HIGH (ref 26.0–34.0)
MCHC: 31.7 g/dL (ref 30.0–36.0)
MCV: 113.7 fL — ABNORMAL HIGH (ref 80.0–100.0)
Monocytes Absolute: 0.3 K/uL (ref 0.1–1.0)
Monocytes Relative: 4 %
Neutro Abs: 6.7 K/uL (ref 1.7–7.7)
Neutrophils Relative %: 89 %
Platelets: 176 K/uL (ref 150–400)
RBC: 3 MIL/uL — ABNORMAL LOW (ref 4.22–5.81)
RDW: 18.1 % — ABNORMAL HIGH (ref 11.5–15.5)
WBC: 7.5 K/uL (ref 4.0–10.5)
nRBC: 0 % (ref 0.0–0.2)

## 2024-03-17 LAB — HEPARIN LEVEL (UNFRACTIONATED): Heparin Unfractionated: 0.52 [IU]/mL (ref 0.30–0.70)

## 2024-03-17 LAB — APTT: aPTT: 81 s — ABNORMAL HIGH (ref 24–36)

## 2024-03-17 MED ORDER — APIXABAN 2.5 MG PO TABS
2.5000 mg | ORAL_TABLET | Freq: Two times a day (BID) | ORAL | Status: DC
Start: 2024-03-17 — End: 2024-03-18
  Administered 2024-03-17 – 2024-03-18 (×3): 2.5 mg via ORAL
  Filled 2024-03-17 (×3): qty 1

## 2024-03-17 MED ORDER — CHLORHEXIDINE GLUCONATE CLOTH 2 % EX PADS
6.0000 | MEDICATED_PAD | Freq: Every day | CUTANEOUS | Status: DC
  Administered 2024-03-18: 6 via TOPICAL

## 2024-03-17 NOTE — Progress Notes (Signed)
 [facility] Progress Note   Subjective:    Seen and examined patient at bedside. He expresses frustration about still being here. Remains on RA. BP is 94/70. Tolerated yesterday's HD. Doesn't appear nothing was pulled.   Objective Vitals:   03/17/24 0400 03/17/24 0800 03/17/24 0812 03/17/24 1211  BP: (!) 88/61 91/66  94/70  Pulse: 88 97  85  Resp: 17 (!) 27  (!) 22  Temp: (!) 97.5 F (36.4 C) (!) 97.2 F (36.2 C)  (!) 97.3 F (36.3 C)  TempSrc: Oral Axillary  Oral  SpO2: 99% 95% 99% 99%  Weight:       Physical Exam Gen: elderly, chronically ill but nontoxic  Eyes: EOMI Neck: supple CV:  RRR on monitor Lungs: normal WOB on RA Extr: no edema, L thigh AVG +t/b Neuro: nonfocal except hard of hearing Skin: ruddy discoloration of pretibial skin  Filed Weights   03/16/24 0807  Weight: 74.1 kg    Intake/Output Summary (Last 24 hours) at 03/17/2024 1304 Last data filed at 03/17/2024 9376 Gross per 24 hour  Intake 184.43 ml  Output 350 ml  Net -165.57 ml    Additional Objective Labs: Basic Metabolic Panel: Recent Labs  Lab 03/14/24 1700 03/15/24 0321 03/16/24 0404  NA 138 134* 134*  K 4.6 5.5* 4.7  CL 96* 98 95*  CO2 25 20* 24  GLUCOSE 128* 121* 111*  BUN 29* 37* 47*  CREATININE 6.05* 6.37* 8.21*  CALCIUM  9.3 8.6* 8.8*   Liver Function Tests: Recent Labs  Lab 03/14/24 1700 03/15/24 0321  AST 24 49*  ALT 23 32  ALKPHOS 73 73  BILITOT 1.2 1.3*  PROT 6.9 5.6*  ALBUMIN  3.7 3.0*   No results for input(s): LIPASE, AMYLASE in the last 168 hours. CBC: Recent Labs  Lab 03/14/24 1700 03/15/24 0321 03/16/24 0404 03/17/24 0230  WBC 8.0 6.6 7.0 7.5  NEUTROABS 7.2 5.8 5.5 6.7  HGB 11.1* 10.3* 10.3* 10.8*  HCT 36.0* 33.3* 33.2* 34.1*  MCV 117.3* 117.3* 115.7* 113.7*  PLT 146* 144* 161 176   Blood Culture    Component Value Date/Time   SDES ABSCESS 03/16/2024 1725   SPECREQUEST NONE 03/16/2024 1725   CULT  03/16/2024 1725     CULTURE REINCUBATED FOR BETTER GROWTH Performed at [facility], [address], [place], [place] [address]    REPTSTATUS PENDING 03/16/2024 1725    Cardiac Enzymes: No results for input(s): CKTOTAL, CKMB, CKMBINDEX, TROPONINI in the last 168 hours. CBG: No results for input(s): GLUCAP in the last 168 hours. Iron Studies: No results for input(s): IRON, TIBC, TRANSFERRIN, FERRITIN in the last 72 hours. 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 PROCEDURE: The procedure, risks, benefits, and alternatives were explained to the patient. Questions regarding the procedure were encouraged and answered. The patient understands and consents to the procedure. patient placed prone. select axial scans through the pelvis obtained. An  appropriate skin entry site was determined and marked. The operative field was prepped with chlorhexidinein a sterile fashion, and a sterile drape was applied covering the operative field. A sterile gown and sterile gloves were used for the procedure. Local anesthesia was provided with 1% Lidocaine . In under CT fluoroscopic guidance, 18 gauge trocar needle advanced into the presacral fluid collection. Amplatz guidewire looped in the collection, confirmed on CT. Tract dilated to facilitate  advancement of 10 French pigtail drain catheter. Approximately 7 mL of thin purulent appearing material could be aspirated. Sample sent for Gram stain and culture. The catheter was secured externally with 0 Prolene suture and placed to gravity drain bag. CT confirms good drain catheter position. The patient tolerated the procedure well. RADIATION DOSE REDUCTION: This exam was performed according to the departmental dose-optimization program which includes automated exposure control, adjustment of the mA and/or kV according to patient size and/or use of iterative reconstruction technique. COMPLICATIONS: None immediate FINDINGS: Small recurrent presacral fluid collection. 10 French pigtail drain catheter placed under CT guidance as above. Small volume thin purulent material aspirated, sent for Gram stain and culture. IMPRESSION: 1. Technically successful CT-guided presacral abscess drain catheter placement. Electronically Signed   By: JONETTA Faes M.D.   On: 03/16/2024 17:31    Medications:  piperacillin -tazobactam (ZOSYN )  IV 100 mL/hr at 03/17/24 9376   vancomycin  750 mg (03/16/24 1308)    apixaban   2.5 mg Oral BID   calcitRIOL   0.5 mcg Oral Q M,W,F-HD   Chlorhexidine  Gluconate Cloth  6 each Topical Q0600   vitamin B-12  1,000 mcg Oral Daily   dorzolamide   1 drop Right Eye QHS   fludrocortisone   0.1 mg Oral Daily   fluticasone  furoate-vilanterol  1 puff Inhalation Daily   hydrocortisone  sod succinate (SOLU-CORTEF ) inj  100 mg Intravenous Q8H   midodrine   30 mg Oral TID   multivitamin  1 tablet Oral Daily   oxyCODONE   2.5 mg Oral Once   pantoprazole   40 mg Oral BID   ramelteon   8 mg Oral QHS   sevelamer  carbonate  1,600 mg Oral TID WC   timolol   1 drop Both Eyes BID    Dialysis Orders: From 01/31/24 --> 2h  B350   72kg   2K bath  AVG  Heparin  none  Calcitriol  0.5 three times per week Recent post wts 69.3kg x 2 tx  Assessment/Plan: **Presacral fluid collection:  drain dislodged.   Imaging shows collected fairly small now. S/p left gluteal drain placement by IR on 9/10. On IV abx with blood cultures pending.  Afebrile with no leukocytosis.    **ESRD on HD: Next HD 9/12.   **Hyperkalemia: resolved with lokelma  x 1 dose. K+ now stable.   **Anemia: Hb 10s, monitor   **Chronic hypotension: on high dose midodrine  and florinef .    **L percutaneous nephrostomy tube: chronic  Charmaine Piety, NP [facility] 03/17/2024,1:04 PM  LOS: 2 days

## 2024-03-17 NOTE — Progress Notes (Signed)
 PROGRESS NOTE                                                                                                                                                                                                             Patient Demographics:    Joshua Riley, is a 88 y.o. male, DOB - 04/13/1936, FMW:[id number]  Outpatient Primary MD for the patient is Joshua Garnette KIDD, MD    LOS - 2  Admit date - 03/14/2024    Chief Complaint  Patient presents with   Wound Check       Brief Narrative (HPI from H&P)   88 y.o. male with history of ESRD on hemodialysis on Monday Wednesday Friday, obstructive uropathy with left-sided nephrostomy, adrenal insufficiency, ulcerative colitis with history of abdominal ileostomy and colostomy, recently treated for bowel obstruction in July 2025 and subsequently admitted in August last month for missed dialysis due to clotted access was referred to the ER after patient's presacral drain got dislodged 2 days ago and patient appeared more confused and also the site looked more erythematous and family was concerned for infection.  Patient states that he has been having worsening pain around the drain site and once the drain got dislodged the pain improved.  Denies any nausea vomiting chest pain shortness of breath or diarrhea.    Subjective:   Patient in bed, appears comfortable, denies any headache, no fever, no chest pain or pressure, no shortness of breath , no abdominal pain. No focal weakness.   Assessment  & Plan :    SIRS with possible developing sepsis source could be the presacral area with the dislodged drain will keep patient on empiric antibiotics follow cultures stress dose steroids, IR consulted underwent left gluteal drain placement by IR on 03/16/2024, thus far cultures negative.  Clinically appears nontoxic continue to monitor cultures. History of A-fib replace back on Eliquis  Addison's  disease on high doses of midodrine  and Florinef , continue stress dose steroids while he is here. ESRD on hemodialysis with obstructive uropathy and nephrostomy tube in place - MWF schedule, nephrology on board. Anemia likely from renal disease.  Follow CBC. Ulcerative colitis with history of abdominal ileostomy with colostomy.  Appears to be stable. Thrombocytopenia appears to be chronic follow CBC.        Condition - Extremely Guarded  Family Communication  :  Daughter updated in detail on 03/16/2024, was upset that IR was not involved on 03/15/2024 and the drain has not been placed yet, told her that I have been taking care of her dad since 7 AM this morning and IR was called promptly at exact 7 AM and case discussed with Dr. Johann, he is on the list for procedure today if he qualifies.  Again she was adamant that the drain should be replaced and was informed that that will be on-call made by IR, if he qualifies for it certainly they will do it.  Code Status : DNR  Consults  : Nephrology, IR  PUD Prophylaxis :  PPI   Procedures  :     CT - 1. Interim removal of left transgluteal percutaneous drainage catheter. Compared with 02/19/2024, decreased size of presacral fluid and gas containing collection. Residual fluid collection measuring 1.8 x 1.8 x 5.5 cm, previously 4.3 x 3.4 x 9.4 cm. Some left gluteal subcutaneous soft tissue stranding at the site of the prior gluteal catheter but no rim enhancing fluid collections within the left gluteal soft tissues. 2. Small to moderate right pleural effusion with passive atelectasis in the right base. Decreased left pleural effusion compared to prior. 3. Atrophic native kidneys with left-sided percutaneous nephrostomy tube in place. No hydronephrosis. 4. Total colectomy with right abdominal ileostomy. No obstructive features. 5. Aortic atherosclerosis.      Disposition Plan  :    Status is: Inpatient   DVT Prophylaxis  :    Place and  maintain sequential compression device Start: 03/15/24 1028   Lab Results  Component Value Date   PLT 176 03/17/2024    Diet :  Diet Order             Diet regular Room service appropriate? Yes; Fluid consistency: Thin  Diet effective now                    Inpatient Medications  Scheduled Meds:  calcitRIOL   0.5 mcg Oral Q M,W,F-HD   Chlorhexidine  Gluconate Cloth  6 each Topical Q0600   vitamin B-12  1,000 mcg Oral Daily   dorzolamide   1 drop Right Eye QHS   fludrocortisone   0.1 mg Oral Daily   fluticasone  furoate-vilanterol  1 puff Inhalation Daily   hydrocortisone  sod succinate (SOLU-CORTEF ) inj  100 mg Intravenous Q8H   midodrine   30 mg Oral TID   multivitamin  1 tablet Oral Daily   oxyCODONE   2.5 mg Oral Once   pantoprazole   40 mg Oral BID   ramelteon   8 mg Oral QHS   sevelamer  carbonate  1,600 mg Oral TID WC   timolol   1 drop Both Eyes BID   Continuous Infusions:  heparin  1,150 Units/hr (03/17/24 9376)   piperacillin -tazobactam (ZOSYN )  IV 100 mL/hr at 03/17/24 0623   vancomycin  750 mg (03/16/24 1308)   PRN Meds:.acetaminophen  **OR** acetaminophen , gabapentin     Objective:   Vitals:   03/17/24 0000 03/17/24 0400 03/17/24 0800 03/17/24 0812  BP: (!) 89/70 (!) 88/61 91/66   Pulse: 75 88 97   Resp: 17 17 (!) 27   Temp:  (!) 97.5 F (36.4 C) (!) 97.2 F (36.2 C)   TempSrc:  Oral Axillary   SpO2: 99% 99% 95% 99%  Weight:        Wt Readings from Last 3 Encounters:  03/16/24 74.1 kg  03/01/24 71.1 kg  02/22/24 72 kg     Intake/Output Summary (Last 24 hours)  at 03/17/2024 0855 Last data filed at 03/17/2024 9376 Gross per 24 hour  Intake 184.43 ml  Output 150 ml  Net 34.43 ml     Physical Exam  Awake Alert, No new F.N deficits, Normal affect [name].AT,PERRAL Supple Neck, No JVD,   Symmetrical Chest wall movement, Good air movement bilaterally, CTAB RRR,No Gallops,Rubs or new Murmurs,  +ve B.Sounds, Abd Soft, No tenderness,   Left gluteal  drain and left nephrostomy drain in place, colostomy in place     Data Review:    Recent Labs  Lab 03/14/24 1700 03/15/24 0321 03/16/24 0404 03/17/24 0230  WBC 8.0 6.6 7.0 7.5  HGB 11.1* 10.3* 10.3* 10.8*  HCT 36.0* 33.3* 33.2* 34.1*  PLT 146* 144* 161 176  MCV 117.3* 117.3* 115.7* 113.7*  MCH 36.2* 36.3* 35.9* 36.0*  MCHC 30.8 30.9 31.0 31.7  RDW 18.4* 18.5* 18.4* 18.1*  LYMPHSABS 0.2* 0.2* 0.6* 0.4*  MONOABS 0.3 0.4 0.5 0.3  EOSABS 0.1 0.1 0.3 0.1  BASOSABS 0.2* 0.0 0.0 0.0    Recent Labs  Lab 03/14/24 1700 03/14/24 1727 03/14/24 2000 03/14/24 2334 03/15/24 0321 03/15/24 0827 03/16/24 0404  NA 138  --   --   --  134*  --  134*  K 4.6  --   --   --  5.5*  --  4.7  CL 96*  --   --   --  98  --  95*  CO2 25  --   --   --  20*  --  24  ANIONGAP 17*  --   --   --  16*  --  15  GLUCOSE 128*  --   --   --  121*  --  111*  BUN 29*  --   --   --  37*  --  47*  CREATININE 6.05*  --   --   --  6.37*  --  8.21*  AST 24  --   --   --  49*  --   --   ALT 23  --   --   --  32  --   --   ALKPHOS 73  --   --   --  73  --   --   BILITOT 1.2  --   --   --  1.3*  --   --   ALBUMIN  3.7  --   --   --  3.0*  --   --   LATICACIDVEN  --  1.4 2.3* 2.0*  --   --   --   INR  --   --   --   --   --  1.2  --   CALCIUM  9.3  --   --   --  8.6*  --  8.8*      Recent Labs  Lab 03/14/24 1700 03/14/24 1727 03/14/24 2000 03/14/24 2334 03/15/24 0321 03/15/24 0827 03/16/24 0404  LATICACIDVEN  --  1.4 2.3* 2.0*  --   --   --   INR  --   --   --   --   --  1.2  --   CALCIUM  9.3  --   --   --  8.6*  --  8.8*    --------------------------------------------------------------------------------------------------------------- Lab Results  Component Value Date   CHOL 76 11/10/2023   HDL 25 (L) 11/10/2023   LDLCALC 15 11/10/2023   TRIG 180 (H) 11/10/2023   CHOLHDL 3.0 11/10/2023  Lab Results  Component Value Date   HGBA1C 5.9 (H) 11/09/2023   No results for input(s): TSH,  T4TOTAL, FREET4, T3FREE, THYROIDAB in the last 72 hours. No results for input(s): VITAMINB12, FOLATE, FERRITIN, TIBC, IRON, RETICCTPCT in the last 72 hours. ------------------------------------------------------------------------------------------------------------------ Cardiac Enzymes No results for input(s): CKMB, TROPONINI, MYOGLOBIN in the last 168 hours.  Invalid input(s): CK  Micro Results Recent Results (from the past 240 hours)  Culture, blood (x 2)     Status: None (Preliminary result)   Collection Time: 03/14/24 11:25 PM   Specimen: BLOOD  Result Value Ref Range Status   Specimen Description BLOOD RIGHT ANTECUBITAL  Final   Special Requests   Final    BOTTLES DRAWN AEROBIC AND ANAEROBIC Blood Culture results may not be optimal due to an inadequate volume of blood received in culture bottles   Culture   Final    NO GROWTH 2 DAYS Performed at [facility], [address], [place], [place] [address]    Report Status PENDING  Incomplete  Culture, blood (x 2)     Status: None (Preliminary result)   Collection Time: 03/14/24 11:27 PM   Specimen: BLOOD LEFT FOREARM  Result Value Ref Range Status   Specimen Description BLOOD LEFT FOREARM  Final   Special Requests   Final    BOTTLES DRAWN AEROBIC AND ANAEROBIC Blood Culture adequate volume   Culture   Final    NO GROWTH 2 DAYS Performed at [facility], [address], [place], [place] [address]    Report Status PENDING  Incomplete  Aerobic/Anaerobic Culture w Gram Stain (surgical/deep wound)     Status: None (Preliminary result)   Collection Time: 03/16/24  5:25 PM   Specimen: Abscess  Result Value Ref Range Status   Specimen Description ABSCESS  Final   Special Requests NONE  Final   Gram Stain   Final    ABUNDANT WBC PRESENT, PREDOMINANTLY PMN NO ORGANISMS SEEN Performed at [facility], 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 PROCEDURE: The procedure, risks, benefits, and alternatives were explained to the patient. Questions regarding the procedure were encouraged and answered. The patient understands and consents to the procedure. patient placed prone. select axial scans through the pelvis obtained. An appropriate skin entry site was determined and marked. The operative field was prepped with chlorhexidinein a sterile fashion, and a sterile drape was applied covering the operative field. A sterile gown and sterile gloves were used for the procedure. Local anesthesia was provided with 1% Lidocaine . In under CT fluoroscopic guidance, 18 gauge trocar needle advanced into the presacral fluid collection. Amplatz guidewire looped in the collection, confirmed on CT. Tract dilated to facilitate advancement of 10 French pigtail drain catheter. Approximately 7 mL of thin purulent appearing material could be aspirated. Sample sent for Gram stain and culture. The catheter was secured externally with 0 Prolene suture and  placed to gravity drain bag. CT confirms good drain catheter position. The patient tolerated the procedure well. RADIATION DOSE REDUCTION: This exam was performed according to the departmental dose-optimization program which includes automated exposure control, adjustment of the mA and/or kV  according to patient size and/or use of iterative reconstruction technique. COMPLICATIONS: None immediate FINDINGS: Small recurrent presacral fluid collection. 10 French pigtail drain catheter placed under CT guidance as above. Small volume thin purulent material aspirated, sent for Gram stain and culture. IMPRESSION: 1. Technically successful CT-guided presacral abscess drain catheter placement. Electronically Signed   By: JONETTA Faes M.D.   On: 03/16/2024 17:31     Signature  -   Lavada Stank M.D on 03/17/2024 at 8:55 AM   -  To page go to [email or web address]

## 2024-03-17 NOTE — Progress Notes (Signed)
 Colostomy bag and wafer changed per patient request using hollister 2 piece with 2 3/4 wafer

## 2024-03-17 NOTE — Progress Notes (Signed)
 Physical Therapy Treatment Patient Details Name: Joshua Riley Nurse MRN: [id number] DOB: 1936/05/17 Today's Date: 03/17/2024   History of Present Illness 88 y.o. male was referred to the ER after patient's presacral drain got dislodged 2 days ago and patient appeared more confused and also the site looked more erythematous and family was concerned for infection.  history of ESRD on hemodialysis on Monday Wednesday Friday, obstructive uropathy with left-sided nephrostomy, adrenal insufficiency, ulcerative colitis with history of abdominal ileostomy and colostomy, recently treated for bowel obstruction in July 2025 and subsequently admitted in August last month for missed dialysis due to clotted access.    PT Comments  Pt tolerated treatment well today. Pt today was able to ambulate in hallway with RW Mod I. Pt presents at or near baseline mobility. Pt has no further acute PT needs and will be signing off. Re consult PT if mobility status changes. Pt would benefit from continued mobility with mobility specialist during acute stay. No change in DC/DME recs at this time.     If plan is discharge home, recommend the following: A little help with walking and/or transfers;A little help with bathing/dressing/bathroom;Assist for transportation;Help with stairs or ramp for entrance   Can travel by private vehicle        Equipment Recommendations  None recommended by PT    Recommendations for Other Services       Precautions / Restrictions Precautions Precautions: Fall Recall of Precautions/Restrictions: Intact Restrictions Weight Bearing Restrictions Per Provider Order: No     Mobility  Bed Mobility               General bed mobility comments: Sitting EOB    Transfers Overall transfer level: Modified independent Equipment used: Rolling walker (2 wheels) Transfers: Sit to/from Stand Sit to Stand: Modified independent (Device/Increase time)           General transfer comment: Mod  I    Ambulation/Gait Ambulation/Gait assistance: Modified independent (Device/Increase time) Gait Distance (Feet): 250 Feet Assistive device: Rolling walker (2 wheels) Gait Pattern/deviations: Trunk flexed, Step-through pattern   Gait velocity interpretation: >2.62 ft/sec, indicative of community ambulatory   General Gait Details: no LOB noted. Good speed.   Stairs             Wheelchair Mobility     Tilt Bed    Modified Rankin (Stroke Patients Only)       Balance Overall balance assessment: Mild deficits observed, not formally tested                                          Communication Communication Communication: Impaired Factors Affecting Communication: Hearing impaired  Cognition Arousal: Alert Behavior During Therapy: WFL for tasks assessed/performed, Agitated   PT - Cognitive impairments: No apparent impairments                         Following commands: Intact      Cueing Cueing Techniques: Verbal cues  Exercises      General Comments General comments (skin integrity, edema, etc.): VSS      Pertinent Vitals/Pain Pain Assessment Pain Assessment: No/denies pain    Home Living                          Prior Function  PT Goals (current goals can now be found in the care plan section) Acute Rehab PT Goals Patient Stated Goal: to go home Progress towards PT goals: Goals met/education completed, patient discharged from PT    Frequency    Min 1X/week      PT Plan      Co-evaluation              AM-PAC PT 6 Clicks Mobility   Outcome Measure  Help needed turning from your back to your side while in a flat bed without using bedrails?: None Help needed moving from lying on your back to sitting on the side of a flat bed without using bedrails?: None Help needed moving to and from a bed to a chair (including a wheelchair)?: None Help needed standing up from a chair using your  arms (e.g., wheelchair or bedside chair)?: None Help needed to walk in hospital room?: None Help needed climbing 3-5 steps with a railing? : A Little 6 Click Score: 23    End of Session Equipment Utilized During Treatment: Gait belt Activity Tolerance: Patient tolerated treatment well Patient left: in bed;with call bell/phone within reach;Other (comment) (Transport arriving to take patient.) Nurse Communication: Mobility status PT Visit Diagnosis: Other abnormalities of gait and mobility (R26.89)     Time: 1355-1411 PT Time Calculation (min) (ACUTE ONLY): 16 min  Charges:    $Gait Training: 8-22 mins PT General Charges $$ ACUTE PT VISIT: 1 Visit                     Sueellen NOVAK, PT, DPT Acute Rehab Services [phone]    Autum Benfer 03/17/2024, 3:17 PM

## 2024-03-17 NOTE — Plan of Care (Signed)
  Problem: Fluid Volume: °Goal: Hemodynamic stability will improve °Outcome: Not Progressing °  °Problem: Clinical Measurements: °Goal: Diagnostic test results will improve °Outcome: Not Progressing °Goal: Signs and symptoms of infection will decrease °Outcome: Not Progressing °  °Problem: Respiratory: °Goal: Ability to maintain adequate ventilation will improve °Outcome: Not Progressing °  °

## 2024-03-17 NOTE — Progress Notes (Signed)
 PHARMACY - ANTICOAGULATION CONSULT NOTE  Pharmacy Consult for heparin  Indication: atrial fibrillation  Allergies  Allergen Reactions   Baclofen Other (See Comments)    Altered mental status, after accidental overdose     Cephalosporins Rash   Ciprofloxacin Itching and Rash   Wound Dressing Adhesive Rash    Patient Measurements: Weight: 74.1 kg (163 lb 5.8 oz)  Vital Signs: Temp: 97.4 F (36.3 C) (09/10 1937) Temp Source: Oral (09/10 1937) BP: 89/70 (09/11 0000) Pulse Rate: 75 (09/11 0000)  Labs: Recent Labs    03/14/24 1700 03/15/24 0321 03/15/24 0827 03/15/24 1336 03/16/24 0404 03/17/24 0230  HGB 11.1* 10.3*  --   --  10.3* 10.8*  HCT 36.0* 33.3*  --   --  33.2* 34.1*  PLT 146* 144*  --   --  161 176  APTT  --   --   --  101* 114* 81*  LABPROT  --   --  15.6*  --   --   --   INR  --   --  1.2  --   --   --   HEPARINUNFRC  --   --   --  0.70 0.73* 0.52  CREATININE 6.05* 6.37*  --   --  8.21*  --     Estimated Creatinine Clearance: 6.4 mL/min (A) (by C-G formula based on SCr of 8.21 mg/dL (H)).  Assessment: 88 yo M on apixaban  PTA for afib now holding for drain placement. Last dose 9/8 am. Pharmacy consulted for heparin .    AM: heparin  level and aPTT within goal on 1150 units/hr s/p restart. Per RN, no signs/symptoms of bleeding or issues. CBC shows Hgb low, stable and plts 176. Levels correlated will monitor with heparin  levels  Goal of Therapy:  Heparin  level 0.3-0.7 units/ml aPTT 66-102 seconds Monitor platelets by anticoagulation protocol: Yes   Plan:  Continue heparin  at 1150 units/hr  F/u confirmatory heparin  level in 8 hours Monitor daily heparin  level, CBC, signs/symptoms of bleeding   Lynwood Poplar, PharmD, BCPS Clinical Pharmacist 03/17/2024 3:01 AM

## 2024-03-18 ENCOUNTER — Other Ambulatory Visit (HOSPITAL_COMMUNITY): Payer: Self-pay

## 2024-03-18 DIAGNOSIS — R6510 Systemic inflammatory response syndrome (SIRS) of non-infectious origin without acute organ dysfunction: Secondary | ICD-10-CM | POA: Diagnosis not present

## 2024-03-18 LAB — CBC WITH DIFFERENTIAL/PLATELET
Basophils Absolute: 0 K/uL (ref 0.0–0.1)
Basophils Relative: 0 %
Eosinophils Absolute: 0 K/uL (ref 0.0–0.5)
Eosinophils Relative: 0 %
HCT: 34.5 % — ABNORMAL LOW (ref 39.0–52.0)
Hemoglobin: 11 g/dL — ABNORMAL LOW (ref 13.0–17.0)
Lymphocytes Relative: 4 %
Lymphs Abs: 0.3 K/uL — ABNORMAL LOW (ref 0.7–4.0)
MCH: 36.1 pg — ABNORMAL HIGH (ref 26.0–34.0)
MCHC: 31.9 g/dL (ref 30.0–36.0)
MCV: 113.1 fL — ABNORMAL HIGH (ref 80.0–100.0)
Monocytes Absolute: 0.5 K/uL (ref 0.1–1.0)
Monocytes Relative: 6 %
Neutro Abs: 6.8 K/uL (ref 1.7–7.7)
Neutrophils Relative %: 90 %
Platelets: 174 K/uL (ref 150–400)
RBC: 3.05 MIL/uL — ABNORMAL LOW (ref 4.22–5.81)
RDW: 18 % — ABNORMAL HIGH (ref 11.5–15.5)
WBC: 7.6 K/uL (ref 4.0–10.5)
nRBC: 0.3 % — ABNORMAL HIGH (ref 0.0–0.2)

## 2024-03-18 LAB — RENAL FUNCTION PANEL
Albumin: 3.1 g/dL — ABNORMAL LOW (ref 3.5–5.0)
Anion gap: 18 — ABNORMAL HIGH (ref 5–15)
BUN: 53 mg/dL — ABNORMAL HIGH (ref 8–23)
CO2: 19 mmol/L — ABNORMAL LOW (ref 22–32)
Calcium: 9.1 mg/dL (ref 8.9–10.3)
Chloride: 93 mmol/L — ABNORMAL LOW (ref 98–111)
Creatinine, Ser: 7.24 mg/dL — ABNORMAL HIGH (ref 0.61–1.24)
GFR, Estimated: 7 mL/min — ABNORMAL LOW (ref >60)
Glucose, Bld: 124 mg/dL — ABNORMAL HIGH (ref 70–99)
Phosphorus: 6.4 mg/dL — ABNORMAL HIGH (ref 2.5–4.6)
Potassium: 5.6 mmol/L — ABNORMAL HIGH (ref 3.5–5.1)
Sodium: 130 mmol/L — ABNORMAL LOW (ref 135–145)

## 2024-03-18 MED ORDER — AMOXICILLIN-POT CLAVULANATE 500-125 MG PO TABS
1.0000 | ORAL_TABLET | Freq: Every day | ORAL | Status: DC
  Administered 2024-03-18: 1 via ORAL
  Filled 2024-03-18: qty 1

## 2024-03-18 MED ORDER — AMOXICILLIN-POT CLAVULANATE 500-125 MG PO TABS
1.0000 | ORAL_TABLET | Freq: Two times a day (BID) | ORAL | 0 refills | Status: DC
  Filled 2024-03-18: qty 14, 7d supply, fill #0

## 2024-03-18 MED ORDER — DOXYCYCLINE HYCLATE 100 MG PO TABS
100.0000 mg | ORAL_TABLET | Freq: Two times a day (BID) | ORAL | 0 refills | Status: DC
  Filled 2024-03-18: qty 20, 10d supply, fill #0

## 2024-03-18 MED ORDER — AMOXICILLIN-POT CLAVULANATE 500-125 MG PO TABS
1.0000 | ORAL_TABLET | Freq: Every day | ORAL | 0 refills | Status: DC
  Filled 2024-03-18: qty 10, 10d supply, fill #0

## 2024-03-18 MED ORDER — HYDROCORTISONE 10 MG PO TABS
10.0000 mg | ORAL_TABLET | Freq: Every evening | ORAL | 0 refills | Status: DC
  Filled 2024-03-18: qty 30, 10d supply, fill #0

## 2024-03-18 MED ORDER — HYDROCORTISONE 20 MG PO TABS
20.0000 mg | ORAL_TABLET | Freq: Every day | ORAL | 0 refills | Status: DC
  Filled 2024-03-18: qty 30, 30d supply, fill #0

## 2024-03-18 NOTE — Discharge Summary (Addendum)
 Joshua Riley FMW:[id number] DOB: 11-Nov-1935 DOA: 03/14/2024  PCP: Katrinka Garnette KIDD, MD  Admit date: 03/14/2024  Discharge date: 03/18/2024  Admitted From: Home   Disposition:  Home - refused to stay   Recommendations for Outpatient Follow-up:   Follow up with PCP in 1-2 weeks  PCP Please obtain BMP/CBC, 2 view CXR in 1week,  (see Discharge instructions)   PCP Please follow up on the following pending results:     Home Health: PT, RN if he qualifies Equipment/Devices: As below Consultations: IR, nephrology Discharge Condition: Stable    CODE STATUS: DNR   Diet Recommendation: Renal diet with 1.5 L fluid restriction per day.  Patient noncompliant.    Chief Complaint  Patient presents with   Wound Check     Brief history of present illness from the day of admission and additional interim summary    88 y.o. male with history of ESRD on hemodialysis on Monday Wednesday Friday, obstructive uropathy with left-sided nephrostomy, adrenal insufficiency, ulcerative colitis with history of abdominal ileostomy and colostomy, recently treated for bowel obstruction in July 2025 and subsequently admitted in August last month for missed dialysis due to clotted access was referred to the ER after patient's presacral drain got dislodged 2 days ago and patient appeared more confused and also the site looked more erythematous and family was concerned for infection.  Patient states that he has been having worsening pain around the drain site and once the drain got dislodged the pain improved.  Denies any nausea vomiting chest pain shortness of breath or diarrhea.                                                                  Hospital Course   SIRS with possible developing sepsis source could be the presacral area with the  dislodged drain will keep patient on empiric antibiotics follow cultures stress dose steroids, IR was consulted underwent left gluteal drain placement by IR on 03/16/2024, thus far cultures negative.  Medically stable refuses to stay in the hospital any longer, of note patient had accidental drain removal and previously had finished his antibiotics for the pelvic fluid, likely the cultures will suggest colonization rather than active infection as he has no signs of active infection currently.  Post drain placement he is back to baseline, blood cultures negative, drain cultures negative thus far, will place him on 10 days of Augmentin  and doxycycline  with outpatient ID, general surgery and IR follow-up, patient refuses to stay any longer in the hospital abusive to staff on a consistent basis. History of A-fib replace back on Eliquis  Addison's disease on high doses of midodrine  and Florinef , continue hydrocortisone  at home dose. ESRD on hemodialysis with obstructive uropathy and nephrostomy tube in place - MWF schedule, nephrology on board. Anemia likely from  renal disease.  Follow CBC. Ulcerative colitis with history of abdominal ileostomy with colostomy.  Appears to be stable.  Home health ordered for the same. Thrombocytopenia appears to be chronic follow CBC.  Discharge diagnosis     Principal Problem:   SIRS (systemic inflammatory response syndrome) (HCC) Active Problems:   End-stage renal disease on hemodialysis (HCC)   Presacral Abscess   Adrenal insufficiency (HCC)   Paroxysmal atrial fibrillation (HCC)   Ulcerative colitis (HCC)   Nephrostomy present [facility])   Depression    Discharge instructions    Discharge Instructions     Discharge instructions   Complete by: As directed    Continue caring for your left nephrostomy tube, left gluteal drain, colostomy as before.  Follow with Primary MD Katrinka Garnette KIDD, MD in 7 days   Get CBC, CMP, Magnesium , 2 view Chest X ray -  checked  next visit with your primary MD or SNF MD ( we routinely change or add medications that can affect your baseline labs and fluid status, therefore we recommend that you get the mentioned basic workup next visit with your PCP, your PCP may decide not to get them or add new tests based on their clinical decision)  Activity: As tolerated with Full fall precautions use walker/cane & assistance as needed  Disposition Home   Diet: Renal Diet with 1.2 L fluid restriction per day  Special Instructions: If you have smoked or chewed Tobacco  in the last 2 yrs please stop smoking, stop any regular Alcohol  and or any Recreational drug use.  On your next visit with your primary care physician please Get Medicines reviewed and adjusted.  Please request your Prim.MD to go over all Hospital Tests and Procedure/Radiological results at the follow up, please get all Hospital records sent to your Prim MD by signing hospital release before you go home.  If you experience worsening of your admission symptoms, develop shortness of breath, life threatening emergency, suicidal or homicidal thoughts you must seek medical attention immediately by calling 911 or calling your MD immediately  if symptoms less severe.  You Must read complete instructions/literature along with all the possible adverse reactions/side effects for all the Medicines you take and that have been prescribed to you. Take any new Medicines after you have completely understood and accpet all the possible adverse reactions/side effects.   Do not drive when taking Pain medications.  Do not take more than prescribed Pain, Sleep and Anxiety Medications  Wear Seat belts while driving.   Discharge wound care:   Complete by: As directed    Continue caring for your left nephrostomy tube, left gluteal drain, colostomy as before.   Increase activity slowly   Complete by: As directed        Discharge Medications   Allergies as of 03/18/2024        Reactions   Baclofen Other (See Comments)   Altered mental status, after accidental overdose   Cephalosporins Rash   Ciprofloxacin Itching, Rash   Wound Dressing Adhesive Rash        Medication List     TAKE these medications    amoxicillin -clavulanate 500-125 MG tablet Commonly known as: AUGMENTIN  Take 1 tablet by mouth daily at 6 (six) AM.   atorvastatin  20 MG tablet Commonly known as: LIPITOR Take 1 tablet (20 mg total) by mouth daily.   calcitRIOL  0.5 MCG capsule Commonly known as: ROCALTROL  Take 1 capsule (0.5 mcg total) by mouth every Monday, Wednesday, and Friday  with hemodialysis.   dorzolamide  2 % ophthalmic solution Commonly known as: TRUSOPT  Place 1 drop into the right eye at bedtime.   doxycycline  100 MG capsule Commonly known as: VIBRAMYCIN  Take 1 capsule (100 mg total) by mouth 2 (two) times daily.   Eliquis  2.5 MG Tabs tablet Generic drug: apixaban  Take 1 tablet (2.5 mg total) by mouth 2 (two) times daily.   fludrocortisone  0.1 MG tablet Commonly known as: FLORINEF  Take 1 tablet (0.1 mg total) by mouth daily.   folic acid  1 MG tablet Commonly known as: FOLVITE  Take 1 tablet (1 mg total) by mouth daily.   gabapentin  300 MG capsule Commonly known as: NEURONTIN  Take 300 mg by mouth daily as needed (Pain).   hydrocortisone  20 MG tablet Commonly known as: CORTEF  Take 1 tablet (20 mg total) by mouth daily.   hydrocortisone  10 MG tablet Commonly known as: CORTEF  Take 2 tablets (20 mg total) by mouth daily and take 1 tablet (10 mg total) by mouth every evening.   midodrine  10 MG tablet Commonly known as: PROAMATINE  Take 3 tablets (30 mg total) by mouth 3 (three) times daily.   mometasone -formoterol  200-5 MCG/ACT Aero Commonly known as: DULERA  Inhale 2 puffs into the lungs 2 (two) times daily.   multivitamin Tabs tablet Take 1 tablet by mouth daily.   Omega-3 Gummies Chew Chew 1,200 mg by mouth in the morning and at bedtime.    pantoprazole  40 MG tablet Commonly known as: PROTONIX  Take 1 tablet (40 mg total) by mouth 2 (two) times daily.   ramelteon  8 MG tablet Commonly known as: ROZEREM  Take 1 tablet (8 mg total) by mouth at bedtime.   Saline Flush 0.9 % Soln Use 5 mLs by Intracatheter route daily as directed.   sertraline  50 MG tablet Commonly known as: ZOLOFT  Take 25 mg by mouth at bedtime.   sevelamer  carbonate 800 MG tablet Commonly known as: RENVELA  Take 1,600 mg by mouth 3 (three) times daily with meals.   SLEEP AID PO Take 1 Dose by mouth at bedtime. Sleep aid of unknown name. OTC.   timolol  0.5 % ophthalmic solution Commonly known as: TIMOPTIC  Place 1 drop into both eyes in the morning and at bedtime.   traMADol  50 MG tablet Commonly known as: ULTRAM  Take 1 tablet (50 mg total) by mouth every 8 (eight) hours as needed (for pain). What changed: when to take this   Tylenol  325 MG tablet Generic drug: acetaminophen  Take 325-650 mg by mouth every 6 (six) hours as needed for mild pain (pain score 1-3) or headache.   VITAMIN B-12 PO Take 1 capsule by mouth in the morning and at bedtime.               Discharge Care Instructions  (From admission, onward)           Start     Ordered   03/18/24 0000  Discharge wound care:       Comments: Continue caring for your left nephrostomy tube, left gluteal drain, colostomy as before.   03/18/24 [id number]             Follow-up Information     Katrinka Garnette KIDD, MD. Schedule an appointment as soon as possible for a visit in 1 week(s).   Specialty: Family Medicine Contact information: [address] [place] [address] [phone]         Johann Sieving, MD. Schedule an appointment as soon as possible for a visit in 1 week(s).  Specialties: Interventional Radiology, Radiology Contact information: [address] [place] [address] [phone]         [facility] [place]. Schedule  an appointment as soon as possible for a visit in 1 week(s).   Specialty: General Surgery Contact information: [address] [place]  [address] [phone]        Luiz Channel, MD. Schedule an appointment as soon as possible for a visit in 1 week(s).   Specialty: Infectious Diseases Contact information: [address] [place] [address] [phone]         Ste[facility], [facility]  Follow up.   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 PROCEDURE: The procedure, risks, benefits, and alternatives were explained to the patient. Questions regarding the procedure were encouraged and answered. The patient understands and consents to the procedure. patient placed prone. select axial scans through the pelvis obtained. An appropriate skin entry site was determined and marked. The operative field was prepped with chlorhexidinein a sterile fashion, and a sterile drape was applied covering the operative field. A sterile gown and sterile gloves were used for the procedure. Local anesthesia was provided with 1% Lidocaine . In under CT fluoroscopic guidance, 18 gauge trocar needle  advanced into the presacral fluid collection. Amplatz guidewire looped in the collection, confirmed on CT. Tract dilated to facilitate advancement of 10 French pigtail drain catheter. Approximately 7 mL of thin purulent appearing material could be aspirated. Sample sent for Gram stain and culture. The catheter was secured externally with 0 Prolene suture and placed to gravity drain bag. CT confirms good drain catheter position. The patient tolerated the procedure well. RADIATION DOSE REDUCTION: This exam was performed according to the departmental dose-optimization program which includes automated exposure control, adjustment of the mA and/or kV according to patient size and/or use of iterative reconstruction technique. COMPLICATIONS: None immediate FINDINGS: Small recurrent presacral fluid collection. 10 French pigtail drain catheter placed under CT guidance as above. Small volume thin purulent material aspirated, sent for Gram stain and culture. IMPRESSION: 1. Technically successful CT-guided presacral abscess drain catheter placement. Electronically Signed   By: JONETTA Faes M.D.   On: 03/16/2024 17:31   CT ABDOMEN PELVIS W CONTRAST Result Date: 03/14/2024 CLINICAL DATA:  Inadvertent left drain removal drain site is red and looks infected increased confusion EXAM: CT ABDOMEN AND PELVIS WITH CONTRAST TECHNIQUE: Multidetector CT imaging of the abdomen and pelvis was performed using the standard protocol following bolus administration of intravenous contrast. RADIATION DOSE REDUCTION: This exam was performed according to the departmental dose-optimization program which includes automated exposure control, adjustment of the mA and/or kV according to patient size and/or use of iterative reconstruction technique. CONTRAST:  75mL OMNIPAQUE  IOHEXOL  350 MG/ML SOLN COMPARISON:  CT  02/24/2024, 02/19/2024, 01/25/2024, exams dating back to 06/11/2023 FINDINGS: Lower chest: Lung bases demonstrate small moderate right pleural  effusion. Passive atelectasis in the right base. Decreased left pleural effusion compared to prior. Cardiomegaly. Trace pericardial effusion or thickening. Gynecomastia Hepatobiliary: Gallstone. No focal hepatic abnormality or biliary dilatation Pancreas: Atrophic.  No inflammation or ductal dilatation Spleen: Normal in size without focal abnormality. Adrenals/Urinary Tract: Adrenal glands are normal. Atrophic native kidneys. Left-sided percutaneous nephrostomy tube remains in place. Thickened bladder wall with decompressed bladder. Stomach/Bowel: Stomach within normal limits. No dilated small bowel. Total colectomy with right abdominal ileostomy. No obstructive features. Vascular/Lymphatic: Moderate aortic atherosclerosis. No aneurysm. Partially visualized AV loop graft in the left groin and lower extremity. Small chronic fluid collection anterior to the femoral vessels. Enlarged tortuous appearing left external iliac vein. No suspicious lymph nodes Reproductive: Prostate appears enlarged. Penile implant with right anterior pelvic reservoir Other: No free air. No significant ascites. Interim removal of left transgluteal percutaneous drainage catheter. Compared with 02/19/2024, decreased size of presacral fluid collection that contains a few gas bubbles. Residual fluid collection measuring 1.8 cm transverse by 1.8 cm AP by 5.5 cm oblique craniocaudal on series 7, image 65, previously the collection measured 4.3 x 3.4 x 9.4 cm. Musculoskeletal: No acute or suspicious osseous abnormality. 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 Interim removal of left transgluteal percutaneous drainage catheter. Compared with 02/19/2024, decreased size of presacral fluid and gas containing collection. Residual fluid collection measuring 1.8 x 1.8 x 5.5 cm, previously 4.3 x 3.4 x 9.4 cm. Some left gluteal subcutaneous soft tissue stranding at the site of the prior gluteal catheter but no  rim enhancing fluid collections within the left gluteal soft tissues. 2. Small to moderate right pleural effusion with passive atelectasis in the right base. Decreased left pleural effusion compared to prior. 3. Atrophic native kidneys with left-sided percutaneous nephrostomy tube in place. No hydronephrosis. 4. Total colectomy with right abdominal ileostomy. No obstructive features. 5. Aortic atherosclerosis. Aortic Atherosclerosis (ICD10-I70.0). Electronically Signed   By: Luke Bun M.D.   On: 03/14/2024 22:18   DG Chest Portable 1 View Result Date: 03/14/2024 EXAM: 1 VIEW XRAY OF THE CHEST 03/14/2024 09:25:00 PM COMPARISON: 02/22/2024 CLINICAL HISTORY: Flank pain. Drain placed in his pelvis through left lower back hip area several weeks ago, drain came out Sat. Family states site is red and looks infected. Family endorses some confusion over last 2 days. Also endorses 9/10 pain. FINDINGS: LUNGS AND PLEURA: Pulmonary vascular congestion. Small right pleural effusion and associated basilar airspace opacities. No pneumothorax. HEART AND MEDIASTINUM: Stable cardiomegaly. BONES AND SOFT TISSUES: No acute osseous abnormality. VASCULATURE: Right subclavian stent. IMPRESSION: 1. Small right pleural effusion and associated basilar airspace opacities. 2. Stable cardiomegaly and pulmonary vascular congestion. Electronically signed by: Norman Gatlin MD 03/14/2024 09:33 PM EDT RP Workstation: [id number]   CT ABDOMEN PELVIS W CONTRAST Result Date: 03/04/2024 CLINICAL DATA:  Drain in perirectal region EXAM: CT ABDOMEN AND PELVIS WITH CONTRAST TECHNIQUE: Multidetector CT imaging of the abdomen and pelvis was performed using the standard protocol following bolus administration of intravenous contrast. RADIATION DOSE REDUCTION: This exam was performed according to the departmental dose-optimization program which includes automated exposure control, adjustment of the mA and/or kV according to patient size and/or use of  iterative reconstruction technique. CONTRAST:  ISOVUE -300 IOPAMIDOL  (ISOVUE -300) INJECTION 61% COMPARISON:  01/25/2024 FINDINGS: Lower chest: Cardiomegaly. Small pericardial effusion. Moderate right, small left pleural effusions and  associated atelectasis or consolidation. Hepatobiliary: No solid liver abnormality is seen. Gallstone. No gallbladder wall thickening, or biliary dilatation. Pancreas: Unremarkable. No pancreatic ductal dilatation or surrounding inflammatory changes. Spleen: Normal in size without significant abnormality. Adrenals/Urinary Tract: Adrenal glands are unremarkable. Severely atrophic kidneys. Left-sided percutaneous nephrostomy with formed pigtail in the left renal pelvis. No hydronephrosis. Severely thickened, decompressed urinary bladder wall (series 6, image 103) Stomach/Bowel: Stomach is within normal limits. Status post total colectomy with right lower quadrant end ileostomy. No evidence of bowel wall thickening, distention, or inflammatory changes. Vascular/Lymphatic: Aortic atherosclerosis and vascular calcinosis. No enlarged abdominal or pelvic lymph nodes. Reproductive: Prostatomegaly. Penile implant with right lower quadrant reservoir. Other: Small volume perihepatic ascites. Left-sided transgluteal approach percutaneous pigtail drain catheter is positioned with formed pigtail at, but not within the left aspect of a large presacral fluid collection measuring 9.4 x 4.3 x 3.4 cm (series 6, image 43, series 2, image 67). Musculoskeletal: No acute or significant osseous findings. IMPRESSION: 1. Left-sided transgluteal approach percutaneous pigtail drain catheter is positioned with formed pigtail at, but not within the left aspect of a large presacral fluid collection measuring 9.4 x 4.3 x 3.4 cm. 2. Status post total colectomy with right lower quadrant end ileostomy. 3. Severely atrophic kidneys. Left-sided percutaneous nephrostomy with formed pigtail in the left renal pelvis. No  hydronephrosis. 4. Severely thickened, decompressed urinary bladder wall, consistent with nonspecific cystitis or chronic urinary bladder outlet obstruction. 5. Small volume perihepatic ascites. 6. Moderate right, small left pleural effusions and associated atelectasis or consolidation. 7. Cardiomegaly and small pericardial effusion. Aortic Atherosclerosis (ICD10-I70.0). Electronically Signed   By: Marolyn JONETTA Jaksch M.D.   On: 03/04/2024 07:09   CT GUIDED SOFT TISSUE FLUID DRAIN BY PERC CATH Result Date: 02/24/2024 INDICATION: Recurrent presacral abscess EXAM: CT GUIDED DRAINAGE OF THE RECURRENT PRESACRAL ABSCESS ABSCESS TECHNIQUE: Multidetector CT imaging of the PELVIS was performed following the standard protocol WITHOUT IV contrast. RADIATION DOSE REDUCTION: This exam was performed according to the departmental dose-optimization program which includes automated exposure control, adjustment of the mA and/or kV according to patient size and/or use of iterative reconstruction technique. MEDICATIONS: The patient is currently admitted to the hospital and receiving intravenous antibiotics. The antibiotics were administered within an appropriate time frame prior to the initiation of the procedure. ANESTHESIA/SEDATION: Moderate (conscious) sedation was employed during this procedure. A total of Versed  1.0 mg and Fentanyl  25 mcg was administered intravenously by the radiology nurse. Total intra-service moderate Sedation Time: 5 15 minutes. The patient's level of consciousness and vital signs were monitored continuously by radiology nursing throughout the procedure under my direct supervision. COMPLICATIONS: None immediate. TECHNIQUE: Informed written consent was obtained from the patient after a thorough discussion of the procedural risks, benefits and alternatives. All questions were addressed. Maximal Sterile Barrier Technique was utilized including caps, mask, sterile gowns, sterile gloves, sterile drape, hand hygiene  and skin antiseptic. A timeout was performed prior to the initiation of the procedure. PROCEDURE: Previous imaging reviewed. Patient position nearly prone. Noncontrast localization CT performed. The recurrent presacral pelvic abscess was localized and marked for a left trans gluteal approach. Under sterile conditions and local anesthesia, the 15 cm 18 gauge access needle was advanced from a right trans gluteal approach into the abscess. Needle position confirmed with CT. Syringe aspiration yielded purulent fluid. Guidewire inserted followed by tract dilatation to insert a 10 [place] drain. Drain catheter position confirmed within the abscess with CT. Catheter secured with a silk suture and a sterile  dressing. Syringe aspiration yielded total volume of 60 cc purulent fluid. External suction bulb plaque. No immediate complication. Patient tolerated the procedure well. FINDINGS: Imaging confirms needle placement in the presacral abscess for drain placement IMPRESSION: Successful CT-guided 10 French presacral abscess drain placement. Electronically Signed   By: CHRISTELLA.  Shick M.D.   On: 02/24/2024 14:49   IR US  Guide Vasc Access Left Result Date: 02/23/2024 INDICATION: 88 year old male with end-stage renal disease on hemodialysis via a left thigh arteriovenous graft. EXAM: IR THROMBECTOMY AV FISTULA W/THROMBOLYSIS/PTA INC/SHUNT/IMG LEFT; IR ULTRASOUND GUIDANCE VASC ACCESS LEFT MEDICATIONS: 1000 units heparin ; 2 mg Versed  and 20 mcg fentanyl  administered intravenously by the Radiology nurse. Additionally, 4 mg tPA administered by me directly into the occluded graft. ANESTHESIA/SEDATION: Moderate Sedation Time:  52 minutes The patient's vital signs and level of consciousness were continuously monitored during the procedure by the interventional radiology nurse under my direct supervision. FLUOROSCOPY TIME:  Radiation exposure index: 129 mGy, reference air kerma COMPLICATIONS: None immediate. PROCEDURE: Informed written  consent was obtained from the patient after a thorough discussion of the procedural risks, benefits and alternatives. All questions were addressed. Maximal Sterile Barrier Technique was utilized including caps, mask, sterile gowns, sterile gloves, sterile drape, hand hygiene and skin antiseptic. A timeout was performed prior to the initiation of the procedure. The arteriovenous graft was interrogated with ultrasound and found to completely thrombosed. An image was obtained and stored for the medical record. Local anesthesia was attained by infiltration with 1% lidocaine . A small dermatotomy was made. Under real-time sonographic guidance, the graft was punctured proximally near the arterial anastomosis in antegrade fashion directed toward the venous outflow with a 21 gauge micropuncture needle. Using standard technique, the initial micro needle was exchanged over a 0.018 micro wire for a transitional 4 [place] micro sheath. 4 mg tPA was then instilled through the micro sheath into the arterial limb of the loop graft. A second ultrasound-guided access was then performed. Local anesthesia was attained by infiltration with 1% lidocaine . A small dermatotomy was made. Under real-time sonographic guidance, the graft was punctured in retrograde fashion near the venous anastomosis directed toward the arterial inflow. The micro needle was exchanged over a 0.018 microwire for a transitional 4 [place] micro sheath. 4 mg tPA was then instilled through the micro sheath into the venous limb of the loop graft. Attention was turned back to the antegrade access. A 0.035 Amplatz wire was advanced into the graft. The micro sheath was exchanged for a 6 French vascular sheath. A 5 French angled catheter was introduced over a glidewire and navigated into the left iliac vein. Venography was performed of the central veins. Widely patent left iliac veins and vena cava. No evidence of central stenosis. A pull-back venogram was then performed.  The graft is thrombosed. No definite stenosis at the venous anastomosis. A Bentson wire was advanced into the central veins. The 5 French catheter was removed. A 6 by 80 mm mustang balloon was advanced over the wire and into the loop graft. Sequential angioplasty was then performed throughout the loop graft macerating the thrombus. Angioplasty was also performed at the venous anastomosis. There was no wasting of the balloon to suggest the presence of an underlying stenosis. The balloon was removed. Attention was turned to the retrograde sheath. This was exchanged over an Amplatz wire for a 7 [place] vascular sheath. The angled Kumpe the catheter and Glidewire were reintroduced through the retrograde sheath and navigated into the left external iliac artery. An  arteriogram was performed. The inflow to the graft remains completely occluded. Vascular flow is relatively slow suggesting low cardiac output. A Bentson wire was advanced into the iliac artery. The 5 French catheter was removed. A 5 French Fogarty catheter was advanced. The balloon was inflated and used to pull the arterial plug. This maneuver was repeated several times in till there was restoration of inflow into the graft. Additionally, given difficulty with pulling the arterial plug balloon angioplasty was performed of the arterial anastomosis utilizing a 4 x 40 mm mustang balloon. After this, the Fogarty was able to successfully pull the arterial plug. Follow-up arteriography demonstrates a widely patent arteriovenous graft with brisk flow. No evidence of residual thrombus or stenosis. The access sheaths were removed and hemostasis attained with the assistance of per string sutures. There is an excellent palpable pulse throughout the graft. IMPRESSION: Successful declot of thrombosed left thigh graft. No definite stenosis or inciting reason for thrombosis. However, it does appear that cardiac output is somewhat low. It is possible the patient became  transiently hypotensive or that external compression resulted in thrombosis of the graft. ACCESS: This access remains amenable to future percutaneous interventions as clinically indicated. Electronically Signed   By: Wilkie Lent M.D.   On: 02/23/2024 17:27   IR THROMBECTOMY AV FISTULA W/THROMBOLYSIS/PTA INC/SHUNT/IMG LEFT Result Date: 02/23/2024 INDICATION: 88 year old male with end-stage renal disease on hemodialysis via a left thigh arteriovenous graft. EXAM: IR THROMBECTOMY AV FISTULA W/THROMBOLYSIS/PTA INC/SHUNT/IMG LEFT; IR ULTRASOUND GUIDANCE VASC ACCESS LEFT MEDICATIONS: 1000 units heparin ; 2 mg Versed  and 20 mcg fentanyl  administered intravenously by the Radiology nurse. Additionally, 4 mg tPA administered by me directly into the occluded graft. ANESTHESIA/SEDATION: Moderate Sedation Time:  52 minutes The patient's vital signs and level of consciousness were continuously monitored during the procedure by the interventional radiology nurse under my direct supervision. FLUOROSCOPY TIME:  Radiation exposure index: 129 mGy, reference air kerma COMPLICATIONS: None immediate. PROCEDURE: Informed written consent was obtained from the patient after a thorough discussion of the procedural risks, benefits and alternatives. All questions were addressed. Maximal Sterile Barrier Technique was utilized including caps, mask, sterile gowns, sterile gloves, sterile drape, hand hygiene and skin antiseptic. A timeout was performed prior to the initiation of the procedure. The arteriovenous graft was interrogated with ultrasound and found to completely thrombosed. An image was obtained and stored for the medical record. Local anesthesia was attained by infiltration with 1% lidocaine . A small dermatotomy was made. Under real-time sonographic guidance, the graft was punctured proximally near the arterial anastomosis in antegrade fashion directed toward the venous outflow with a 21 gauge micropuncture needle. Using  standard technique, the initial micro needle was exchanged over a 0.018 micro wire for a transitional 4 [place] micro sheath. 4 mg tPA was then instilled through the micro sheath into the arterial limb of the loop graft. A second ultrasound-guided access was then performed. Local anesthesia was attained by infiltration with 1% lidocaine . A small dermatotomy was made. Under real-time sonographic guidance, the graft was punctured in retrograde fashion near the venous anastomosis directed toward the arterial inflow. The micro needle was exchanged over a 0.018 microwire for a transitional 4 [place] micro sheath. 4 mg tPA was then instilled through the micro sheath into the venous limb of the loop graft. Attention was turned back to the antegrade access. A 0.035 Amplatz wire was advanced into the graft. The micro sheath was exchanged for a 6 French vascular sheath. A 5 French angled catheter was introduced  over a glidewire and navigated into the left iliac vein. Venography was performed of the central veins. Widely patent left iliac veins and vena cava. No evidence of central stenosis. A pull-back venogram was then performed. The graft is thrombosed. No definite stenosis at the venous anastomosis. A Bentson wire was advanced into the central veins. The 5 French catheter was removed. A 6 by 80 mm mustang balloon was advanced over the wire and into the loop graft. Sequential angioplasty was then performed throughout the loop graft macerating the thrombus. Angioplasty was also performed at the venous anastomosis. There was no wasting of the balloon to suggest the presence of an underlying stenosis. The balloon was removed. Attention was turned to the retrograde sheath. This was exchanged over an Amplatz wire for a 7 [place] vascular sheath. The angled Kumpe the catheter and Glidewire were reintroduced through the retrograde sheath and navigated into the left external iliac artery. An arteriogram was performed. The inflow to  the graft remains completely occluded. Vascular flow is relatively slow suggesting low cardiac output. A Bentson wire was advanced into the iliac artery. The 5 French catheter was removed. A 5 French Fogarty catheter was advanced. The balloon was inflated and used to pull the arterial plug. This maneuver was repeated several times in till there was restoration of inflow into the graft. Additionally, given difficulty with pulling the arterial plug balloon angioplasty was performed of the arterial anastomosis utilizing a 4 x 40 mm mustang balloon. After this, the Fogarty was able to successfully pull the arterial plug. Follow-up arteriography demonstrates a widely patent arteriovenous graft with brisk flow. No evidence of residual thrombus or stenosis. The access sheaths were removed and hemostasis attained with the assistance of per string sutures. There is an excellent palpable pulse throughout the graft. IMPRESSION: Successful declot of thrombosed left thigh graft. No definite stenosis or inciting reason for thrombosis. However, it does appear that cardiac output is somewhat low. It is possible the patient became transiently hypotensive or that external compression resulted in thrombosis of the graft. ACCESS: This access remains amenable to future percutaneous interventions as clinically indicated. Electronically Signed   By: Wilkie Lent M.D.   On: 02/23/2024 17:27   DG Chest Port 1 View Result Date: 02/22/2024 CLINICAL DATA:  Clogged dialysis graft in the left groin. EXAM: PORTABLE CHEST 1 VIEW COMPARISON:  01/25/2024 and CT chest 11/09/2023. FINDINGS: Trachea is midline. Heart is enlarged, stable. Trace interstitial prominence and indistinctness with small bilateral pleural effusions. IMPRESSION: Mild congestive heart failure. Electronically Signed   By: Newell Eke M.D.   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 CONTRAST:  10 mL Omnipaque  300-administered via the existing percutaneous drain. FLUOROSCOPY TIME:  Radiation exposure index as provided by the fluoroscopic device 91.7 mGy TECHNIQUE: The patient was positioned prone on the fluoroscopy table. A pre-procedural spot fluoroscopic image was obtained of the presacral/pelvic area and the existing percutaneous drainage catheter. Multiple spot fluoroscopic and radiographic images were obtained following the injection of a small amount of contrast via the existing percutaneous drainage catheter. FINDINGS: Contrast injection confirmed the catheter was not in communication with the presacral abscess. The external portion of the percutaneous drainage catheter was cut and cannulated with a short wire. Under intermittent fluoroscopic guidance the existing percutaneous drainage catheter was removed. A  new 8 [place] mini loop percutaneous drainage catheter was passed over the wire without successful placement into the abscess collection. The catheter was removed and the site was covered with an appropriate dressing. IMPRESSION: Existing percutaneous catheter was retracted and not in communication with the presacral abscess. A drain exchange was attempted but placement into the abscess collection was unsuccessful. The patient will be scheduled early next week for new drain placement under CT guidance with moderate sedation. Electronically Signed   By: Ester Sides M.D.   On: 02/19/2024 16:45   IR Catheter Tube Change Result Date: 02/19/2024 INDICATION: Left percutaneous nephrostomy tube requiring routine exchange. EXAM: FLUOROSCOPIC GUIDED left SIDED NEPHROSTOMY CATHETER EXCHANGE COMPARISON:  IR nephrostomy exchange left 10/22/2023 CONTRAST:  10 mL Isovue -300 administered into the collecting system FLUOROSCOPY TIME:  Radiation exposure index as provided by the  fluoroscopic device 91.7 mGy [place] COMPLICATIONS: None immediate. TECHNIQUE: Informed written consent was obtained from the patient after a discussion of the risks, benefits and alternatives to treatment. Questions regarding the procedure were encouraged and answered. A timeout was performed prior to the initiation of the procedure. The left flank and external portion of existing nephrostomy catheter were prepped and draped in the usual sterile fashion. A sterile drape was applied covering the operative field. Maximum barrier sterile technique with sterile gowns and gloves were used for the procedure. A timeout was performed prior to the initiation of the procedure. A pre procedural spot fluoroscopic image was obtained after contrast was injected via the existing nephrostomy catheter demonstrating appropriate positioning within the renal pelvis. The existing nephrostomy catheter was cut and cannulated with a Benson wire which was coiled within the renal pelvis. Under intermittent fluoroscopic guidance, the existing nephrostomy catheter was exchanged for a new 10 [place] all-purpose drainage catheter. Contrast injection confirmed appropriate positioning within the renal pelvis and a post exchange fluoroscopic image was obtained. The catheter was locked and secured to the skin with a suture. A dressing was placed. The patient tolerated the procedure well without immediate postprocedural complication. FINDINGS: The existing nephrostomy catheter is appropriately positioned and functioning. After successful fluoroscopic guided exchange, the new nephrostomy catheter is coiled and locked within the left renal pelvis. IMPRESSION: Successful fluoroscopic guided exchange of left sided 10.2 French percutaneous nephrostomy catheter. Procedure performed by Warren Dais, NP Electronically Signed   By: Ester Sides M.D.   On: 02/19/2024 16:44   IR Radiologist Eval & Mgmt Result Date: 02/19/2024 EXAM: See EPIC note CHIEF  COMPLAINT: See EPIC note HISTORY OF PRESENT ILLNESS: See EPIC note REVIEW OF SYSTEMS: See EPIC note PHYSICAL EXAMINATION: See EPIC note ASSESSMENT AND PLAN: See EPIC note Electronically Signed   By: Ester Sides M.D.   On: 02/19/2024 16:12    Micro Results    Recent Results (from the past 240 hours)  Culture, blood (x 2)     Status: None (Preliminary result)   Collection Time: 03/14/24 11:25 PM   Specimen: BLOOD  Result Value Ref Range Status   Specimen Description BLOOD RIGHT ANTECUBITAL  Final   Special Requests   Final    BOTTLES DRAWN AEROBIC AND ANAEROBIC Blood Culture results may not be optimal due to an inadequate volume of blood received in culture bottles   Culture   Final    NO GROWTH 4 DAYS Performed at [facility], [address], [place], [place] [address]    Report Status PENDING  Incomplete  Culture, blood (x 2)     Status: None (  Preliminary result)   Collection Time: 03/14/24 11:27 PM   Specimen: BLOOD LEFT FOREARM  Result Value Ref Range Status   Specimen Description BLOOD LEFT FOREARM  Final   Special Requests   Final    BOTTLES DRAWN AEROBIC AND ANAEROBIC Blood Culture adequate volume   Culture   Final    NO GROWTH 4 DAYS Performed at [facility], [address] [place], [place] [address]    Report Status PENDING  Incomplete  Aerobic/Anaerobic Culture w Gram Stain (surgical/deep wound)     Status: None (Preliminary result)   Collection Time: 03/16/24  5:25 PM   Specimen: Abscess  Result Value Ref Range Status   Specimen Description ABSCESS  Final   Special Requests NONE  Final   Gram Stain   Final    ABUNDANT WBC PRESENT, PREDOMINANTLY PMN NO ORGANISMS SEEN    Culture   Final    MODERATE STAPHYLOCOCCUS AUREUS MODERATE GRAM NEGATIVE RODS IDENTIFICATION AND SUSCEPTIBILITIES TO FOLLOW Performed at [facility], [address], [place], [place] [address]    Report Status PENDING  Incomplete    Today   Subjective    Taras Rask today has no headache,no chest abdominal pain,no new weakness tingling or numbness, feels much better wants to go home today.    Objective   Blood pressure 101/77, pulse 94, temperature 97.6 F (36.4 C), temperature source Oral, resp. rate (!) 27, weight 75.7 kg, SpO2 95%.   Intake/Output Summary (Last 24 hours) at 03/18/2024 1030 Last data filed at 03/17/2024 1434 Gross per 24 hour  Intake --  Output 50 ml  Net -50 ml    Exam  Awake Alert, No new F.N deficits,    Nellie.AT,PERRAL Supple Neck,   Symmetrical Chest wall movement, Good air movement bilaterally, CTAB RRR,No Gallops,   +ve B.Sounds, Abd Soft, Non tender,  Colostomy in place, left nephrostomy tube, left gluteal drain in place   Data Review   Recent Labs  Lab 03/14/24 1700 03/15/24 0321 03/16/24 0404 03/17/24 0230 03/18/24 0500  WBC 8.0 6.6 7.0 7.5 7.6  HGB 11.1* 10.3* 10.3* 10.8* 11.0*  HCT 36.0* 33.3* 33.2* 34.1* 34.5*  PLT 146* 144* 161 176 174  MCV 117.3* 117.3* 115.7* 113.7* 113.1*  MCH 36.2* 36.3* 35.9* 36.0* 36.1*  MCHC 30.8 30.9 31.0 31.7 31.9  RDW 18.4* 18.5* 18.4* 18.1* 18.0*  LYMPHSABS 0.2* 0.2* 0.6* 0.4* 0.3*  MONOABS 0.3 0.4 0.5 0.3 0.5  EOSABS 0.1 0.1 0.3 0.1 0.0  BASOSABS 0.2* 0.0 0.0 0.0 0.0    Recent Labs  Lab 03/14/24 1700 03/14/24 1727 03/14/24 2000 03/14/24 2334 03/15/24 0321 03/15/24 0827 03/16/24 0404 03/18/24 0730  NA 138  --   --   --  134*  --  134* 130*  K 4.6  --   --   --  5.5*  --  4.7 5.6*  CL 96*  --   --   --  98  --  95* 93*  CO2 25  --   --   --  20*  --  24 19*  ANIONGAP 17*  --   --   --  16*  --  15 18*  GLUCOSE 128*  --   --   --  121*  --  111* 124*  BUN 29*  --   --   --  37*  --  47* 53*  CREATININE 6.05*  --   --   --  6.37*  --  8.21* 7.24*  AST 24  --   --   --  49*  --   --   --   ALT 23  --   --   --  32  --   --   --   ALKPHOS 73  --   --   --  73  --   --   --   BILITOT 1.2  --   --   --  1.3*  --   --   --   ALBUMIN  3.7  --   --   --   3.0*  --   --  3.1*  LATICACIDVEN  --  1.4 2.3* 2.0*  --   --   --   --   INR  --   --   --   --   --  1.2  --   --   PHOS  --   --   --   --   --   --   --  6.4*  CALCIUM  9.3  --   --   --  8.6*  --  8.8* 9.1    Total Time in preparing paper work, data evaluation and todays exam - 35 minutes  Signature  -    Lavada Stank M.D on 03/18/2024 at 10:30 AM   -  To page go to [email or web address]

## 2024-03-18 NOTE — Plan of Care (Signed)
  Problem: Fluid Volume: Goal: Hemodynamic stability will improve Outcome: Progressing   Problem: Activity: Goal: Risk for activity intolerance will decrease Outcome: Progressing   Problem: Elimination: Goal: Will not experience complications related to bowel motility Outcome: Progressing Goal: Will not experience complications related to urinary retention Outcome: Progressing   Problem: Pain Managment: Goal: General experience of comfort will improve and/or be controlled Outcome: Progressing

## 2024-03-18 NOTE — Progress Notes (Signed)
Patient off floor to dialysis

## 2024-03-18 NOTE — Progress Notes (Signed)
 [facility] Progress Note   Subjective:    Seen and examined patient at bedside. Tolerating UFG 1L so far. BP is 100/70. He reports feeling fine with no acute issues.  Objective Vitals:   03/18/24 0800 03/18/24 0820 03/18/24 0830 03/18/24 0845  BP:  99/75 100/70 108/83  Pulse:  (!) 32 74 84  Resp: 18 (!) 21 14 15   Temp:      TempSrc:      SpO2:  100% 100% 93%  Weight:  75.7 kg     Physical Exam Gen: elderly, chronically ill but nontoxic  Eyes: EOMI Neck: supple CV:  RRR on monitor Lungs: clear anteriorly, normal WOB on RA Extr: no edema, L thigh AVG +t/b Neuro: nonfocal except hard of hearing Skin: ruddy discoloration of pretibial skin  Filed Weights   03/16/24 0807 03/18/24 0820  Weight: 74.1 kg 75.7 kg    Intake/Output Summary (Last 24 hours) at 03/18/2024 0856 Last data filed at 03/17/2024 1434 Gross per 24 hour  Intake --  Output 150 ml  Net -150 ml    Additional Objective Labs: Basic Metabolic Panel: Recent Labs  Lab 03/14/24 1700 03/15/24 0321 03/16/24 0404  NA 138 134* 134*  K 4.6 5.5* 4.7  CL 96* 98 95*  CO2 25 20* 24  GLUCOSE 128* 121* 111*  BUN 29* 37* 47*  CREATININE 6.05* 6.37* 8.21*  CALCIUM  9.3 8.6* 8.8*   Liver Function Tests: Recent Labs  Lab 03/14/24 1700 03/15/24 0321  AST 24 49*  ALT 23 32  ALKPHOS 73 73  BILITOT 1.2 1.3*  PROT 6.9 5.6*  ALBUMIN  3.7 3.0*   No results for input(s): LIPASE, AMYLASE in the last 168 hours. CBC: Recent Labs  Lab 03/14/24 1700 03/15/24 0321 03/16/24 0404 03/17/24 0230 03/18/24 0500  WBC 8.0 6.6 7.0 7.5 7.6  NEUTROABS 7.2 5.8 5.5 6.7 PENDING  HGB 11.1* 10.3* 10.3* 10.8* 11.0*  HCT 36.0* 33.3* 33.2* 34.1* 34.5*  MCV 117.3* 117.3* 115.7* 113.7* 113.1*  PLT 146* 144* 161 176 174   Blood Culture    Component Value Date/Time   SDES ABSCESS 03/16/2024 1725   SPECREQUEST NONE 03/16/2024 1725   CULT  03/16/2024 1725    CULTURE REINCUBATED FOR BETTER GROWTH Performed at  [facility], [address], [place], [place] [address]    REPTSTATUS PENDING 03/16/2024 1725    Cardiac Enzymes: No results for input(s): CKTOTAL, CKMB, CKMBINDEX, TROPONINI in the last 168 hours. CBG: No results for input(s): GLUCAP in the last 168 hours. Iron Studies: No results for input(s): IRON, TIBC, TRANSFERRIN, FERRITIN in the last 72 hours. 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 PROCEDURE: The procedure, risks, benefits, and alternatives were explained to the patient. Questions regarding the procedure were encouraged and answered. The patient understands and consents to the procedure. patient placed prone. select axial scans through the pelvis obtained. An appropriate skin entry site was  determined and marked. The operative field was prepped with chlorhexidinein a sterile fashion, and a sterile drape was applied covering the operative field. A sterile gown and sterile gloves were used for the procedure. Local anesthesia was provided with 1% Lidocaine . In under CT fluoroscopic guidance, 18 gauge trocar needle advanced into the presacral fluid collection. Amplatz guidewire looped in the collection, confirmed on CT. Tract dilated to facilitate advancement of 10 French pigtail drain catheter.  Approximately 7 mL of thin purulent appearing material could be aspirated. Sample sent for Gram stain and culture. The catheter was secured externally with 0 Prolene suture and placed to gravity drain bag. CT confirms good drain catheter position. The patient tolerated the procedure well. RADIATION DOSE REDUCTION: This exam was performed according to the departmental dose-optimization program which includes automated exposure control, adjustment of the mA and/or kV according to patient size and/or use of iterative reconstruction technique. COMPLICATIONS: None immediate FINDINGS: Small recurrent presacral fluid collection. 10 French pigtail drain catheter placed under CT guidance as above. Small volume thin purulent material aspirated, sent for Gram stain and culture. IMPRESSION: 1. Technically successful CT-guided presacral abscess drain catheter placement. Electronically Signed   By: JONETTA Faes M.D.   On: 03/16/2024 17:31    Medications:   amoxicillin -clavulanate  1 tablet Oral Daily   apixaban   2.5 mg Oral BID   calcitRIOL   0.5 mcg Oral Q M,W,F-HD   Chlorhexidine  Gluconate Cloth  6 each Topical Q0600   vitamin B-12  1,000 mcg Oral Daily   dorzolamide   1 drop Right Eye QHS   fludrocortisone   0.1 mg Oral Daily   fluticasone  furoate-vilanterol  1 puff Inhalation Daily   hydrocortisone  sod succinate (SOLU-CORTEF ) inj  100 mg Intravenous Q8H   midodrine   30 mg Oral TID   multivitamin  1 tablet Oral Daily   oxyCODONE   2.5 mg Oral Once   pantoprazole   40 mg Oral BID   ramelteon   8 mg Oral QHS   sevelamer  carbonate  1,600 mg Oral TID WC   timolol   1 drop Both Eyes BID    Dialysis Orders: From 01/31/24 --> 2h  B350   72kg   2K bath  AVG  Heparin  none  Calcitriol  0.5 three times per week Recent post wts 69.3kg x 2 tx  Assessment/Plan: **Presacral fluid collection:  drain dislodged.  Imaging shows collected fairly small now. S/p left gluteal drain placement by IR on 9/10. On IV abx with blood  cultures 9/10 pending.  Afebrile with no leukocytosis.    **ESRD on HD: On HD.   **Hyperkalemia: resolved with lokelma  x 1 dose. K+ now stable.   **Anemia: Hb 11, monitor   **Chronic hypotension: on high dose midodrine  and florinef .    **L percutaneous nephrostomy tube: chronic  Charmaine Piety, NP [facility] 03/18/2024,8:56 AM  LOS: 3 days

## 2024-03-18 NOTE — Discharge Planning (Signed)
 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 On Midodrine  and Florinef   Aranesp : Given: No    Last Hgb: 11 PRBC's Given: No  ESA dose for discharge: N/A IV Iron dose at discharge: N/A  Heparin  change: No  EDW Change: No  Bath Change: No  Access intervention/Change: No  Calcitriol  change: No  Discharge Labs: Calcium  9.1  Phosphorus 6.4  Albumin  3.1  K+ 5.6 (pre-HD)  IV Antibiotics: Yes Details: Patient placed on PO ABXs. Augmentin  and Doxycycline  for SIRS with possible developing sepsis source could be the presacral area   On Coumadin?: No. On Eliquis    OTHER/APPTS/LAB ORDERS:  Re-check K+ level pre-HD at next treatment on 9/15    D/C Meds to be reconciled by nurse after every discharge.  Completed By: Charmaine Piety, NP   Reviewed by: MD:______ RN_______

## 2024-03-18 NOTE — Progress Notes (Signed)
 Pt refused lab draw, states he is going home today. Lab to try again later in am. Call bell in reach

## 2024-03-18 NOTE — Progress Notes (Signed)
 D/C order noted. Contacted [facility] to be advised of pt's d/c today and that pt should resume care on Monday.   Randine Mungo Dialysis Navigator [phone]

## 2024-03-18 NOTE — TOC CM/SW Note (Addendum)
 Previous Care Management note states patient is active with [facility]. Orders in for home health. [place] with [facility]

## 2024-03-18 NOTE — Discharge Instructions (Signed)
 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  Follow with Primary MD Katrinka Garnette KIDD, MD in 7 days   Get CBC, CMP, Magnesium , 2 view Chest X ray -  checked next visit with your primary MD or SNF MD ( we routinely change or add medications that can affect your baseline labs and fluid status, therefore we recommend that you get the mentioned basic workup next visit with your PCP, your PCP may decide not to get them or add new tests based on their clinical decision)  Activity: As tolerated with Full fall precautions use walker/cane & assistance as needed  Disposition Home   Diet: Renal Diet with 1.2 L fluid restriction per day  Special Instructions: If you have smoked or chewed Tobacco  in the last 2 yrs please stop smoking, stop any regular Alcohol  and or any Recreational drug use.  On your next visit with your primary care physician please Get Medicines reviewed and adjusted.  Please request your Prim.MD to go over all Hospital Tests and Procedure/Radiological results at the follow up, please get all Hospital records sent to your Prim MD by signing hospital release before you go home.  If you experience worsening of your admission symptoms, develop shortness of breath, life threatening emergency, suicidal or homicidal thoughts you must seek medical attention immediately by calling 911 or calling your MD immediately  if symptoms less severe.  You Must read complete instructions/literature along with all the possible adverse reactions/side effects for all the Medicines you take and that have been prescribed to you. Take any new Medicines after you have completely understood and accpet all the possible adverse reactions/side effects.   Do not drive when taking Pain medications.  Do not take more than prescribed Pain, Sleep and Anxiety Medications  Wear Seat belts while driving.

## 2024-03-18 NOTE — Progress Notes (Signed)
   03/18/24 1125  Vitals  Pulse Rate 78  Resp (!) 23  BP 108/65  SpO2 90 %  Weight 74.5 kg  Type of Weight Post-Dialysis  Oxygen Therapy  Patient Activity (if Appropriate) In bed  Pulse Oximetry Type Continuous  Oximetry Probe Site Changed No  Post Treatment  Dialyzer Clearance Lightly streaked  Hemodialysis Intake (mL) 0 mL  Liters Processed 48.8  Fluid Removed (mL) 1000 mL  Tolerated HD Treatment Yes  AVG/AVF Arterial Site Held (minutes) 10 minutes  AVG/AVF Venous Site Held (minutes) 10 minutes   Received patient in bed to unit.  Alert and oriented.  Informed consent signed and in chart.   TX duration:2.75hrs  Patient tolerated well.  Transported back to the room  Alert, without acute distress.  Hand-off given to patient's nurse.   Access used: L thigh AVF Access issues: none  Total UF removed: 1L Medication(s) given: none   [facility]

## 2024-03-19 LAB — CULTURE, BLOOD (ROUTINE X 2)
Culture: NO GROWTH
Culture: NO GROWTH
Special Requests: ADEQUATE

## 2024-03-20 ENCOUNTER — Other Ambulatory Visit: Payer: Self-pay | Admitting: Family Medicine

## 2024-03-20 ENCOUNTER — Telehealth: Payer: Self-pay | Admitting: Nephrology

## 2024-03-20 NOTE — Telephone Encounter (Signed)
 Transition of Care Contact from Inpatient Facility   Date of Discharge: 03/18/24 Date of Contact: 03/20/24 Method of contact: phone Talked to patient's daughter [name]   Patient contacted to discuss transition of care form recent hospitaliztion. Patient was admitted to [facility] from 03/14/24 to 03/18/24 with the discharge diagnosis of SIRS w/possible developing sepsis, L gluteal drain placement by IR.     Medication changes were reviewed.  Patient will follow up with is outpatient dialysis center 03/21/24.  Other follow up needs include - he needs syringes per [facility] nurse, she is placing order to get what he needs.    Manuelita Labella, PA-C [employer]

## 2024-03-21 ENCOUNTER — Telehealth: Payer: Self-pay | Admitting: *Deleted

## 2024-03-21 ENCOUNTER — Ambulatory Visit: Payer: Self-pay | Admitting: Family Medicine

## 2024-03-21 ENCOUNTER — Telehealth: Payer: Self-pay | Admitting: Family Medicine

## 2024-03-21 NOTE — Transitions of Care (Post Inpatient/ED Visit) (Signed)
   03/21/2024  Name: Joshua Riley MRN: [id number] DOB: 09-06-1935  Today's TOC FU Call Status: Today's TOC FU Call Status:: Unsuccessful Call (1st Attempt) Unsuccessful Call (1st Attempt) Date: 03/21/24  Attempted to reach the patient regarding the most recent Inpatient/ED visit.  Follow Up Plan: Additional outreach attempts will be made to reach the patient to complete the Transitions of Care (Post Inpatient/ED visit) call.   Mliss Creed [facility], BSN RN Care Manager/ Transition of Care [place]/ [facility] [phone]

## 2024-03-21 NOTE — Telephone Encounter (Signed)
 [facility] faxed Home Health Certificate (Order [place] [id number]), to be filled out by provider. Patient requested to send it back via Fax within ASAP. Document is located in providers tray at front office.Please advise at [phone].

## 2024-03-22 ENCOUNTER — Other Ambulatory Visit (HOSPITAL_COMMUNITY): Payer: Self-pay

## 2024-03-22 ENCOUNTER — Telehealth: Payer: Self-pay | Admitting: Family Medicine

## 2024-03-22 ENCOUNTER — Other Ambulatory Visit: Payer: Self-pay | Admitting: General Surgery

## 2024-03-22 ENCOUNTER — Telehealth: Payer: Self-pay | Admitting: *Deleted

## 2024-03-22 ENCOUNTER — Other Ambulatory Visit: Payer: Self-pay | Admitting: Family Medicine

## 2024-03-22 DIAGNOSIS — J90 Pleural effusion, not elsewhere classified: Secondary | ICD-10-CM

## 2024-03-22 DIAGNOSIS — N186 End stage renal disease: Secondary | ICD-10-CM

## 2024-03-22 DIAGNOSIS — K611 Rectal abscess: Secondary | ICD-10-CM

## 2024-03-22 MED ORDER — TRAMADOL HCL 50 MG PO TABS
50.0000 mg | ORAL_TABLET | Freq: Three times a day (TID) | ORAL | 0 refills | Status: DC | PRN
  Filled 2024-03-22: qty 15, 5d supply, fill #0

## 2024-03-22 NOTE — Telephone Encounter (Signed)
 Completed and faxed back 03/22/2024.

## 2024-03-22 NOTE — Addendum Note (Signed)
 Addended by: KATRINKA GARNETTE KIDD on: 03/22/2024 05:39 PM   Modules accepted: Orders

## 2024-03-22 NOTE — Telephone Encounter (Signed)
 I ordered these-have him keep hospital follow-up on October 2.  Go ahead and have him come back for x-ray 1 week after hospital discharge-needs lab visit and x-ray visit

## 2024-03-22 NOTE — Transitions of Care (Post Inpatient/ED Visit) (Addendum)
 03/22/2024  Name: Joshua Riley MRN: [id number] DOB: 14-Oct-1935  Today's TOC FU Call Status: Today's TOC FU Call Status:: Successful TOC FU Call Completed TOC FU Call Complete Date: 03/22/24 Patient's Name and Date of Birth confirmed.  Transition Care Management Follow-up Telephone Call Date of Discharge: 03/18/24 Discharge Facility: [name]) Type of Discharge: Inpatient Admission Primary Inpatient Discharge Diagnosis:: SIRS (systemic inflammatory response syndrome) How have you been since you were released from the hospital?: Better (per daughter , pt is eating, drinking well, uses cane, walker for ambulation, attending dialysis 3 x per week) Any questions or concerns?: No  Items Reviewed: Did you receive and understand the discharge instructions provided?: Yes Medications obtained,verified, and reconciled?: No Medications Not Reviewed Reasons:: Other: (patient's daughter is not present with pt, unable to complete medication reconciliation, daughter states  I know he's taking the antibiotics for sure) Any new allergies since your discharge?: No Dietary orders reviewed?: Yes Type of Diet Ordered:: renal, heart healthy Do you have support at home?: Yes People in Home [RPT]: alone Name of Support/Comfort Primary Source: daughter [name] Brooke  Medications Reviewed Today: Unable to review medications today, did not speak with pt, daughter is not with pt Medications Reviewed Today   Medications were not reviewed in this encounter     Home Care and Equipment/Supplies: Were Home Health Services Ordered?: Yes Name of Home Health Agency:: [facility] Has Agency set up a time to come to your home?: Yes First Home Health Visit Date: 03/20/24 Any new equipment or medical supplies ordered?: No  Functional Questionnaire: Do you need assistance with bathing/showering or dressing?: No Do you need assistance with meal preparation?: No Do you need assistance with eating?: No Do  you have difficulty maintaining continence: No (dialysis) Do you need assistance with getting out of bed/getting out of a chair/moving?: Yes (walker, cane) Do you have difficulty managing or taking your medications?: No (daugher provides some oversight)  Follow up appointments reviewed: PCP Follow-up appointment confirmed?: Yes Date of PCP follow-up appointment?: 04/07/24 Follow-up Provider: Garnette Lukes MD-  daughter does not want sooner appointment, pt to have labs and CXR Specialist Hospital Follow-up appointment confirmed?: No (daughter states she has contact #'s for ID and IR, will call and make both appointments (both due within 1 week of discharge, daughter verbalizes understanding)) Reason Specialist Follow-Up Not Confirmed: Patient has Specialist Provider Number and will Call for Appointment Do you need transportation to your follow-up appointment?: No Do you understand care options if your condition(s) worsen?: Yes-patient verbalized understanding  SDOH Interventions Today    Flowsheet Row Most Recent Value  SDOH Interventions   Food Insecurity Interventions Intervention Not Indicated  Housing Interventions Intervention Not Indicated  Transportation Interventions Intervention Not Indicated  Utilities Interventions Intervention Not Indicated    Goals Addressed             This Visit's Progress    VBCI Transitions of Care (TOC) Care Plan       Problems:  Recent Hospitalization for SIRS (systemic inflammatory response syndrome)  Spoke with daugher DPR [name] Brooke,  pt lives alone, daughter comes in daily to check on patient, pt goes to dialysis 3 x week, [facility] home health is working with pt,  pt is independent with care of ileostomy, has walker and cane, unable to complete medication reconciliation with pt as daughter provides oversight, she is not with pt and does not have the medications in front of her although reports  he is taking the antibiotics,  pt has hired  Engineer, production M-F, 12-4 pm,  pt has presacral drain placed by IR (previously infected) Pt reports he has contact number for interventional radiology (Dr. Johann) and infectious disease (Dr. Luiz), will call and schedule appointments  Goal:  Over the next 30 days, the patient will not experience hospital readmission  Interventions: SIRS (systemic inflammatory response syndrome) Evaluation of current treatment plan related to SIRS (systemic inflammatory response syndrome), self-management and patient's adherence to plan as established by provider. Discussed plans with patient for ongoing care management follow up and provided patient with direct contact information for care management team Reviewed all upcoming scheduled appointments Reviewed importance of not missing any dialysis sessions Reviewed reportable signs/ symptoms and any issues with ileostomy Reviewed safety precautions, importance of using walker Reviewed signs /symptoms of infection with sacral drain and report to IR (Dr. Johann) Verified home health is working with pt  Patient Self Care Activities:  Attend all scheduled provider appointments Attend church or other social activities Call pharmacy for medication refills 3-7 days in advance of running out of medications Call provider office for new concerns or questions  Notify RN Care Manager of TOC call rescheduling needs Participate in Transition of Care Program/Attend TOC scheduled calls Take medications as prescribed   Report any issues/ infection with sacral drain immediately to interventional radiology Report any issues with dialysis vascular access, do not miss any dialysis sessions Follow renal diet/ fluid restrictions Fall prevention strategies: change positions slowly, use a walker or cane (per provider recommendations) when walking, keep walkways clear, have good lighting in room. It is important to contact your provider if you have any falls. Maintain muscle strength/tone  by exercise per provider recommendations.   Plan:  Telephone follow up- 03/29/24 @ 1045 am         Mliss Creed [facility], BSN RN Care Manager/ Transition of Care [place]/ [facility] [phone]

## 2024-03-22 NOTE — Telephone Encounter (Signed)
 Please see patient message and advise on when you would like to add the patient to the schedule.    Copied from CRM [id number]. Topic: General - Other >> Mar 22, 2024  2:18 PM Aisha D wrote: Reason for CRM: Pt's daughter called and stated that the pt was recently discharged from the hospital. Daughter stated that they informed the pt that he needs to have labs and a chest xray completed. Daughter stated that this information is in the notes and would like a callback with an update.

## 2024-03-23 ENCOUNTER — Telehealth: Payer: Self-pay | Admitting: Family Medicine

## 2024-03-23 ENCOUNTER — Other Ambulatory Visit (HOSPITAL_COMMUNITY): Payer: Self-pay

## 2024-03-23 ENCOUNTER — Other Ambulatory Visit: Payer: Self-pay | Admitting: Family Medicine

## 2024-03-23 LAB — AEROBIC/ANAEROBIC CULTURE W GRAM STAIN (SURGICAL/DEEP WOUND)

## 2024-03-23 NOTE — Telephone Encounter (Signed)
 Dr. Geofm Infectious Disease MD with [facility] received fax with culture data for Patient. Dr. Geofm states he has not seen Patient in approx.1 year. More recently in Care Everywhere it looks like Patient is seen at [facility] Infectious Disease (last seen 01/27/24)  Dr. Geofm states he is happy to see patient if that's what Dr. Katrinka prefers, but Gregory Calone NP at [facility] Infectious Disease has been treating Patient  Also, If Patient wants to see Dr. Geofm, Patient can call [phone], but if Patient urgently needs antibiotics, he should go to the Hospital/ED.

## 2024-03-23 NOTE — Telephone Encounter (Signed)
 Left vm for pts daughter regarding scheduling appt for xray and labs in 1 week. Please schedule this if she calls back. Orders have been placed.

## 2024-03-24 NOTE — Telephone Encounter (Signed)
 Cathlyn- you saw him on inpatient consultation but he has never had outpatient follow up with you all- would he need a new referral outpatient to you- has rather resistant bacteria growing at his abscess site

## 2024-03-24 NOTE — Telephone Encounter (Signed)
 Please see message from Dr. Geofm and advise.

## 2024-03-25 NOTE — Telephone Encounter (Signed)
 Please call patient and encourage local follow-up with infectious disease did they see if they have any recommendations for chronic management of the area

## 2024-03-28 NOTE — Telephone Encounter (Signed)
 Called patient to encourage him to set up an appointment with Infectious Disease per Dr Katrinka. Unable to leave VM. Sent MyChart message.

## 2024-03-29 ENCOUNTER — Telehealth: Payer: Self-pay | Admitting: *Deleted

## 2024-03-29 NOTE — Patient Outreach (Signed)
 Transition of Care week 2  Visit Note  03/29/2024  Name: Joshua Riley MRN: [id number]          DOB: [birth year]  Situation: Patient enrolled in [facility] 30-day program. Visit completed with patient by telephone.   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   lyrica     Ileostomy in place [facility])    Obstructive uropathy    With chronic left nephrostomy   Old retinal detachment, total or subtotal    Orthostatic hypotension 10/22/2021   Sleep apnea    does not need a machine   Stroke (HCC)    Ulcerative colitis (HCC)    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    Assessment: Patient Reported Symptoms: Cognitive Cognitive Status: No symptoms reported, Alert and oriented to person, place, and time, Normal speech and language skills, Able to follow simple commands      Neurological Neurological Review of Symptoms: No symptoms reported    HEENT HEENT Symptoms Reported: No symptoms reported      Cardiovascular Cardiovascular Symptoms Reported: No symptoms reported Does patient have uncontrolled Hypertension?: No    Respiratory Respiratory Symptoms Reported: No symptoms reported    Endocrine Endocrine Symptoms Reported: No symptoms reported Is patient diabetic?: No    Gastrointestinal Gastrointestinal Symptoms Reported: No symptoms reported Additional Gastrointestinal Details: ileostomy- pt maintains without difficulty Gastrointestinal Management Strategies: Adequate rest, Ileostomy Gastrointestinal Self-Management Outcome: 4 (good)    Genitourinary Genitourinary Symptoms Reported: Other Other Genitourinary Symptoms:  ESRD Genitourinary Management Strategies: Hemodialysis Hemodialysis Schedule: M, W, F Genitourinary Self-Management Outcome: 3 (uncertain) Genitourinary Comment: Reviewed renal diet  Integumentary Integumentary Symptoms Reported: Other Other Integumentary Symptoms: pt reports presacral drain intact,  spoke with home health nurse at the home, reports scant drainage Additional Integumentary Details: pt to follow up with infectious disease 03/31/24 Skin Management Strategies: Medication therapy Skin Self-Management Outcome: 3 (uncertain) Skin Comment: pt continues on antibiotics  Musculoskeletal Musculoskelatal Symptoms Reviewed: Unsteady gait Additional Musculoskeletal Details: uses cane, walker Musculoskeletal Management Strategies: Medical device, Routine screening Musculoskeletal Self-Management Outcome: 4 (good) Musculoskeletal Comment: Reviewed safety precautions Falls in the past year?: No Number of falls in past year: 1 or less Fall risk Follow up: Falls evaluation completed, Falls prevention discussed  Psychosocial Psychosocial Symptoms Reported: No symptoms reported   Major Change/Loss/Stressor/Fears (CP): Medical condition, self Behaviors When Feeling Stressed/Fearful: pt reports he his coping well Techniques to Cope with Loss/Stress/Change: Diversional activities Quality of Family Relationships: supportive, involved, helpful Do you feel physically threatened by others?: No   There were no vitals filed for this visit.  Medications Reviewed Today     Reviewed by Aura Mliss LABOR, RN (Registered Nurse) on 03/29/24 at 1128  Med List Status: <None>   Medication Order Taking? Sig Documenting Provider Last Dose Status Informant  amoxicillin -clavulanate (AUGMENTIN ) 500-125 MG tablet [id number] Yes Take 1 tablet by mouth daily at 6 (six) AM. Singh, Prashant K, MD  Active   atorvastatin  (LIPITOR) 20 MG tablet [id number] Yes Take 1 tablet (20 mg total) by mouth  daily. Sebastian Toribio GAILS,  MD  Active Self, Pharmacy Records           Med Note JACKOLYN WADDELL DEL   Tue Mar 15, 2024  7:49 AM)    calcitRIOL  (ROCALTROL ) 0.5 MCG capsule [id number] Yes Take 1 capsule (0.5 mcg total) by mouth every Monday, Wednesday, and Friday with hemodialysis. Danford, Lonni SQUIBB, MD  Active Self, Pharmacy Records           Med Note JACKOLYN WADDELL DEL   Tue Mar 15, 2024  7:50 AM)    Cyanocobalamin  (VITAMIN B-12 PO) [id number] Yes Take 1 capsule by mouth in the morning and at bedtime. [provider]  Active Self, Pharmacy Records  dorzolamide  (TRUSOPT ) 2 % ophthalmic solution [id number] Yes Place 1 drop into the right eye at bedtime. [provider]  Active Self, Pharmacy Records           Med Note STEFFI NIAN   Sat Nov 28, 2023 12:49 PM)    doxycycline  (VIBRA -TABS) 100 MG tablet [id number] Yes Take 1 tablet (100 mg total) by mouth 2 (two) times daily. Singh, Prashant K, MD  Active   Doxylamine Succinate, Sleep, (SLEEP AID PO) [id number] Yes Take 1 Dose by mouth at bedtime. Sleep aid of unknown name. OTC. [provider]  Active Self, Pharmacy Records  ELIQUIS  2.5 MG TABS tablet [id number] Yes Take 1 tablet (2.5 mg total) by mouth 2 (two) times daily. Katrinka Garnette KIDD, MD  Active Self, Pharmacy Records           Med Note JACKOLYN WADDELL DEL   Tue Mar 15, 2024  7:50 AM) Anthon give me a time for last dose yesterday morning.   fludrocortisone  (FLORINEF ) 0.1 MG tablet [id number]  Take 1 tablet (0.1 mg total) by mouth daily.  Patient not taking: Reported on 03/29/2024   Jonel Lonni SQUIBB, MD  Active Self, Pharmacy Records           Med Note JACKOLYN WADDELL DEL Pablo Feb 22, 2024 10:28 AM) Recent dispenses. Confirmed not taking.   folic acid  (FOLVITE ) 1 MG tablet [id number]  Take 1 tablet (1 mg total) by mouth daily.  Patient not taking: Reported on 03/29/2024   Regalado, Belkys A, MD  Active Self, Pharmacy Records           Med Note JACKOLYN WADDELL DEL Pablo Feb 22, 2024  10:28 AM) Recent dispenses. Confirmed not taking.    gabapentin  (NEURONTIN ) 300 MG capsule [id number] Yes TAKE 1 CAPSULE BY MOUTH ONCE A DAY AT BEDTIME FOR NEUROPATHY Katrinka Garnette KIDD, MD  Active   hydrocortisone  (CORTEF ) 10 MG tablet [id number] Yes Take 2 tablets (20 mg total) by mouth daily and take 1 tablet (10 mg total) by mouth every evening. Singh, Prashant K, MD  Active   hydrocortisone  (CORTEF ) 20 MG tablet [id number] Yes Take 1 tablet (20 mg total) by mouth daily. Singh, Prashant K, MD  Active   midodrine  (PROAMATINE ) 10 MG tablet [id number] Yes Take 3 tablets (30 mg total) by mouth 3 (three) times daily. Hunsucker, Donnice SAUNDERS, MD  Active Self, Pharmacy Records  mometasone -formoterol  (DULERA ) 200-5 MCG/ACT TERESE [id number] Yes Inhale 2 puffs into the lungs 2 (two) times daily. Rosario Leatrice FERNS, MD  Active Self, Pharmacy Records  multivitamin (RENA-VIT) TABS tablet [id number] Yes Take 1 tablet by mouth daily. [provider]  Active Self, Pharmacy Records  Omega Fatty Acids-Vitamins (OMEGA-3 GUMMIES) CHEW [id number] Yes Chew 1,200 mg by mouth  in the morning and at bedtime. [provider]  Active Self, Pharmacy Records  pantoprazole  (PROTONIX ) 40 MG tablet [id number]  Take 1 tablet (40 mg total) by mouth 2 (two) times daily.  Patient not taking: Reported on 03/29/2024   Sebastian Toribio GAILS, MD  Active Self, Pharmacy Records           Med Note JACKOLYN WADDELL VEAR Pablo Feb 22, 2024 10:31 AM) Recent dispenses. Confirmed not taking.    ramelteon  (ROZEREM ) 8 MG tablet [id number] Yes Take 1 tablet (8 mg total) by mouth at bedtime. Katrinka Garnette KIDD, MD  Active Self, Pharmacy Records  sertraline  (ZOLOFT ) 50 MG tablet [id number]  Take 25 mg by mouth at bedtime.  Patient not taking: Reported on 03/29/2024   [provider]  Active Self, Pharmacy Records           Med Note JACKOLYN WADDELL VEAR Pablo Feb 22, 2024 10:31 AM) Recent dispenses. Confirmed not taking.    sevelamer   carbonate (RENVELA ) 800 MG tablet [id number]  Take 1,600 mg by mouth 3 (three) times daily with meals.  Patient not taking: Reported on 03/29/2024   [provider]  Active Self, Pharmacy Records           Med Note JACKOLYN WADDELL VEAR   Tue Mar 15, 2024  7:58 AM) Recent dispenses. Confirmed not taking.   Sodium Chloride  Flush (SALINE FLUSH) 0.9 % SOLN [id number] Yes Use 5 mLs by Intracatheter route daily as directed. Gery Daved SAILOR, PA  Active Self, Pharmacy Records  timolol  (TIMOPTIC ) 0.5 % ophthalmic solution [id number] Yes Place 1 drop into both eyes in the morning and at bedtime. [provider]  Active Self, Pharmacy Records           Med Note STEFFI NIAN   Sat Nov 28, 2023 12:48 PM)    traMADol  (ULTRAM ) 50 MG tablet [id number] Yes Take 1 tablet (50 mg total) by mouth every 8 (eight) hours as needed (for pain). Jonel Lonni SQUIBB, MD  Active Self, Pharmacy Records  traMADol  (ULTRAM ) 50 MG tablet [id number] Yes Take 1 tablet (50 mg total) by mouth every 8 (eight) hours as needed (for pain). Katrinka Garnette KIDD, MD  Active   TYLENOL  325 MG tablet [id number] Yes Take 325-650 mg by mouth every 6 (six) hours as needed for mild pain (pain score 1-3) or headache. [provider]  Active Self, Pharmacy Records  Med List Note Donnajean Suzen SQUIBB, CPhT 06/02/22 1426): Dialysis Mon, Wed, Friday            Goals Addressed             This Visit's Progress    VBCI Transitions of Care (TOC) Care Plan       Problems:  Recent Hospitalization for SIRS (systemic inflammatory response syndrome)  Spoke with daugher DPR [name] Brooke,  pt lives alone, daughter comes in daily to check on patient, pt goes to dialysis 3 x week, [facility] is working with pt,  pt is independent with care of ileostomy, has walker and cane, unable to complete medication reconciliation with pt as daughter provides oversight, she is not with pt and does not have the medications in  front of her although reports  he is taking the antibiotics,  pt has hired aide M-F, 12-4 pm,  pt has presacral drain placed by IR (previously infected) 03/29/24- Pt reports he has contact number for interventional radiology (Dr. Johann) and infectious disease (  Dr. Luiz), will follow up with ID on 9/25, primary care provider on 10/2 Home health nurse is with pt in the home, reports scant drainage from presacral drain, pt reports he has all medications and taking as prescribed  Goal:  Over the next 30 days, the patient will not experience hospital readmission  Interventions: SIRS (systemic inflammatory response syndrome) Evaluation of current treatment plan related to SIRS (systemic inflammatory response syndrome), self-management and patient's adherence to plan as established by provider. Discussed plans with patient for ongoing care management follow up and provided patient with direct contact information for care management team Reviewed all upcoming scheduled appointments Reviewed importance of not missing any dialysis sessions Reinforced reportable signs/ symptoms and any issues with ileostomy Reinforced safety precautions, importance of using walker Reinforced signs /symptoms of infection with sacral drain and report to IR (Dr. Johann) Spoke with home health nurse in the home, plan of care discussed, home health RN providing assessment, oversight of presacral drain  Patient Self Care Activities:  Attend all scheduled provider appointments Attend church or other social activities Call pharmacy for medication refills 3-7 days in advance of running out of medications Call provider office for new concerns or questions  Notify RN Care Manager of TOC call rescheduling needs Participate in Transition of Care Program/Attend TOC scheduled calls Take medications as prescribed   Report any issues/ infection with sacral drain immediately to interventional radiology Report any issues with  dialysis vascular access, do not miss any dialysis sessions Follow renal diet/ fluid restrictions Fall prevention strategies: change positions slowly, use a walker or cane (per provider recommendations) when walking, keep walkways clear, have good lighting in room. It is important to contact your provider if you have any falls. Maintain muscle strength/tone by exercise per provider recommendations.   Plan:  Telephone follow up- 04/05/24 @ 1045 am         Recommendation:   PCP Follow-up Specialty provider follow-up infectious disease 03/31/24  Follow Up Plan:   Telephone follow-up 04/05/24 @ 1045 am  Mliss Creed [facility], BSN RN Care Manager/ Transition of Care [place]/ [facility] [phone]

## 2024-03-29 NOTE — Patient Instructions (Signed)
 Visit Information  Thank you for taking time to visit with me today. Please don't hesitate to contact me if I can be of assistance to you before our next scheduled telephone appointment.  Our next appointment is by telephone on 04/05/24 @ 1045 am  Following is a copy of your care plan:   Goals Addressed             This Visit's Progress    VBCI Transitions of Care (TOC) Care Plan       Problems:  Recent Hospitalization for SIRS (systemic inflammatory response syndrome)  Spoke with daugher DPR [name] Brooke,  pt lives alone, daughter comes in daily to check on patient, pt goes to dialysis 3 x week, [facility] is working with pt,  pt is independent with care of ileostomy, has walker and cane, unable to complete medication reconciliation with pt as daughter provides oversight, she is not with pt and does not have the medications in front of her although reports  he is taking the antibiotics,  pt has hired aide M-F, 12-4 pm,  pt has presacral drain placed by IR (previously infected) 03/29/24- Pt reports he has contact number for interventional radiology (Dr. Johann) and infectious disease (Dr. Luiz), will follow up with ID on 9/25, primary care provider on 10/2 Home health nurse is with pt in the home, reports scant drainage from presacral drain, pt reports he has all medications and taking as prescribed  Goal:  Over the next 30 days, the patient will not experience hospital readmission  Interventions: SIRS (systemic inflammatory response syndrome) Evaluation of current treatment plan related to SIRS (systemic inflammatory response syndrome), self-management and patient's adherence to plan as established by provider. Discussed plans with patient for ongoing care management follow up and provided patient with direct contact information for care management team Reviewed all upcoming scheduled appointments Reviewed importance of not missing any dialysis sessions Reinforced  reportable signs/ symptoms and any issues with ileostomy Reinforced safety precautions, importance of using walker Reinforced signs /symptoms of infection with sacral drain and report to IR (Dr. Johann) Spoke with home health nurse in the home, plan of care discussed, home health RN providing assessment, oversight of presacral drain  Patient Self Care Activities:  Attend all scheduled provider appointments Attend church or other social activities Call pharmacy for medication refills 3-7 days in advance of running out of medications Call provider office for new concerns or questions  Notify RN Care Manager of TOC call rescheduling needs Participate in Transition of Care Program/Attend TOC scheduled calls Take medications as prescribed   Report any issues/ infection with sacral drain immediately to interventional radiology Report any issues with dialysis vascular access, do not miss any dialysis sessions Follow renal diet/ fluid restrictions Fall prevention strategies: change positions slowly, use a walker or cane (per provider recommendations) when walking, keep walkways clear, have good lighting in room. It is important to contact your provider if you have any falls. Maintain muscle strength/tone by exercise per provider recommendations.   Plan:  Telephone follow up- 04/05/24 @ 1045 am         Patient verbalizes understanding of instructions and care plan provided today and agrees to view in MyChart. Active MyChart status and patient understanding of how to access instructions and care plan via MyChart confirmed with patient.     Telephone follow up appointment with care management team member scheduled for:  04/05/24 @ 1045 am  Please call the care guide team at [phone]  if you need to cancel or reschedule your appointment.   Please call the Suicide and Crisis Lifeline: 988 call the [place]  National Suicide Prevention Lifeline: (364)047-4927 or TTY: 410-708-4623 TTY 937 558 4509)  to talk to a trained counselor call 1-800-273-TALK (toll free, 24 hour hotline) go to [facility] [address], [place] [phone]) call 911 if you are experiencing a Mental Health or Behavioral Health Crisis or need someone to talk to.  Mliss Creed [facility], BSN RN Care Manager/ Transition of Care [place]/ [facility] [phone]

## 2024-03-30 ENCOUNTER — Ambulatory Visit: Admitting: Physician Assistant

## 2024-03-31 ENCOUNTER — Ambulatory Visit (INDEPENDENT_AMBULATORY_CARE_PROVIDER_SITE_OTHER): Admitting: Internal Medicine

## 2024-03-31 ENCOUNTER — Other Ambulatory Visit: Payer: Self-pay

## 2024-03-31 ENCOUNTER — Encounter: Payer: Self-pay | Admitting: Internal Medicine

## 2024-03-31 VITALS — BP 108/67 | HR 97 | Temp 97.3°F

## 2024-03-31 DIAGNOSIS — Z992 Dependence on renal dialysis: Secondary | ICD-10-CM

## 2024-03-31 DIAGNOSIS — L02818 Cutaneous abscess of other sites: Secondary | ICD-10-CM | POA: Diagnosis present

## 2024-03-31 DIAGNOSIS — N186 End stage renal disease: Secondary | ICD-10-CM

## 2024-03-31 DIAGNOSIS — L0291 Cutaneous abscess, unspecified: Secondary | ICD-10-CM

## 2024-03-31 NOTE — Progress Notes (Unsigned)
 RFV: hospitalization  Patient ID: Joshua Riley, male   DOB: [birth year], 88 y.o.   MRN: [id number]  HPI SIRS with possible developing sepsis source could be the presacral area with the dislodged drain will keep patient on empiric antibiotics follow cultures stress dose steroids, IR was consulted underwent left gluteal drain placement by IR on 03/16/2024, thus far cultures negative.  Medically stable refuses to stay in the hospital any longer, of note patient had accidental drain removal and previously had finished his antibiotics for the pelvic fluid, likely the cultures will suggest colonization rather than active infection as he has no signs of active infection currently.  Post drain placement he is back to baseline, blood cultures negative, drain cultures negative thus far, will place him on 10 days of Augmentin  and doxycycline  with outpatient ID, general surgery and IR follow-up,   Drain replaced  but having very little output. Micro results from 9/10  Specimen Description ABSCESS  Special Requests NONE  Gram Stain ABUNDANT WBC PRESENT, PREDOMINANTLY PMN NO ORGANISMS SEEN  Culture MODERATE STAPHYLOCOCCUS AUREUS MODERATE KLEBSIELLA PNEUMONIAE MODERATE ESCHERICHIA COLI Confirmed Extended Spectrum Beta-Lactamase Producer (ESBL).  In bloodstream infections from ESBL organisms, carbapenems are preferred over piperacillin /tazobactam. They are shown to have a lower risk of mortality. MODERATE STREPTOCOCCUS ANGINOSIS NO ANAEROBES ISOLATED Performed at [facility], [address] [place], [place] [address]  Report Status 03/23/2024 FINAL  Organism ID, Bacteria STAPHYLOCOCCUS AUREUS  Organism ID, Bacteria KLEBSIELLA PNEUMONIAE  Organism ID, Bacteria ESCHERICHIA COLI  Organism ID, Bacteria STREPTOCOCCUS ANGINOSIS    Outpatient Encounter Medications as of 03/31/2024  Medication Sig   amoxicillin -clavulanate (AUGMENTIN ) 500-125 MG tablet Take 1 tablet by mouth daily at 6 (six) AM.    atorvastatin  (LIPITOR) 20 MG tablet Take 1 tablet (20 mg total) by mouth daily.   calcitRIOL  (ROCALTROL ) 0.5 MCG capsule Take 1 capsule (0.5 mcg total) by mouth every Monday, Wednesday, and Friday with hemodialysis.   Cyanocobalamin  (VITAMIN B-12 PO) Take 1 capsule by mouth in the morning and at bedtime.   dorzolamide  (TRUSOPT ) 2 % ophthalmic solution Place 1 drop into the right eye at bedtime.   doxycycline  (VIBRA -TABS) 100 MG tablet Take 1 tablet (100 mg total) by mouth 2 (two) times daily.   Doxylamine Succinate, Sleep, (SLEEP AID PO) Take 1 Dose by mouth at bedtime. Sleep aid of unknown name. OTC.   ELIQUIS  2.5 MG TABS tablet Take 1 tablet (2.5 mg total) by mouth 2 (two) times daily.   fludrocortisone  (FLORINEF ) 0.1 MG tablet Take 1 tablet (0.1 mg total) by mouth daily.   folic acid  (FOLVITE ) 1 MG tablet Take 1 tablet (1 mg total) by mouth daily.   gabapentin  (NEURONTIN ) 300 MG capsule TAKE 1 CAPSULE BY MOUTH ONCE A DAY AT BEDTIME FOR NEUROPATHY   hydrocortisone  (CORTEF ) 10 MG tablet Take 2 tablets (20 mg total) by mouth daily and take 1 tablet (10 mg total) by mouth every evening.   hydrocortisone  (CORTEF ) 20 MG tablet Take 1 tablet (20 mg total) by mouth daily.   midodrine  (PROAMATINE ) 10 MG tablet Take 3 tablets (30 mg total) by mouth 3 (three) times daily.   mometasone -formoterol  (DULERA ) 200-5 MCG/ACT AERO Inhale 2 puffs into the lungs 2 (two) times daily.   multivitamin (RENA-VIT) TABS tablet Take 1 tablet by mouth daily.   Omega Fatty Acids-Vitamins (OMEGA-3 GUMMIES) CHEW Chew 1,200 mg by mouth in the morning and at bedtime.   pantoprazole  (PROTONIX ) 40 MG tablet Take 1 tablet (40 mg total)  by mouth 2 (two) times daily.   ramelteon  (ROZEREM ) 8 MG tablet Take 1 tablet (8 mg total) by mouth at bedtime.   sertraline  (ZOLOFT ) 50 MG tablet Take 25 mg by mouth at bedtime.   sevelamer  carbonate (RENVELA ) 800 MG tablet Take 1,600 mg by mouth 3 (three) times daily with meals.   Sodium Chloride   Flush (SALINE FLUSH) 0.9 % SOLN Use 5 mLs by Intracatheter route daily as directed.   timolol  (TIMOPTIC ) 0.5 % ophthalmic solution Place 1 drop into both eyes in the morning and at bedtime.   traMADol  (ULTRAM ) 50 MG tablet Take 1 tablet (50 mg total) by mouth every 8 (eight) hours as needed (for pain).   traMADol  (ULTRAM ) 50 MG tablet Take 1 tablet (50 mg total) by mouth every 8 (eight) hours as needed (for pain).   TYLENOL  325 MG tablet Take 325-650 mg by mouth every 6 (six) hours as needed for mild pain (pain score 1-3) or headache.   No facility-administered encounter medications on file as of 03/31/2024.     Patient Active Problem List   Diagnosis Date Noted   SIRS (systemic inflammatory response syndrome) (HCC) 03/14/2024   Dialysis complication 02/22/2024   Presacral Abscess 02/22/2024   History of small bowel obstruction 02/11/2024   Adrenal insufficiency 02/11/2024   Asthma 02/11/2024   Small bowel obstruction (HCC) 01/25/2024   Cerebrovascular disease 12/02/2023   Seizure like activity 11/28/2023   Acute pericarditis 11/11/2023   Abscess of abdominal cavity (HCC) 07/17/2023   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   Anastomotic leak of intestine 07/14/2023   History of anemia due to chronic kidney disease 07/08/2023   Abscess of groin, left 07/07/2023   Macrocytic anemia 04/08/2023   Depression 04/08/2023   Abnormal bladder CT 03/23/2023   Infection due to ESBL-producing Escherichia coli 03/23/2023   DOE (dyspnea on exertion) 07/22/2022   Coag negative Staphylococcus bacteremia 06/06/2022   History of sepsis 06/02/2022   Ulcerative colitis (HCC) 06/02/2022   End-stage renal disease on hemodialysis (HCC) 05/06/2022   SBO (small bowel obstruction) (HCC) 11/18/2021   Glaucoma 11/18/2021   Paroxysmal atrial fibrillation (HCC) 11/18/2021   Orthostatic hypotension 10/22/2021   Idiopathic neuropathy 10/22/2021   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   Nephrostomy present (HCC)  10/22/2021     Health Maintenance Due  Topic Date Due   Medicare Annual Wellness (AWV)  05/28/2023     Review of Systems  Physical Exam   BP 108/67   Pulse 97   Temp (!) 97.3 F (36.3 C) (Temporal)   SpO2 97%   Physical Exam  Constitutional: He is oriented to person, place, and time. He appears well-developed and well-nourished. No distress.  HENT:  Mouth/Throat: Oropharynx is clear and moist. No oropharyngeal exudate.  Cardiovascular: Normal rate, regular rhythm and normal heart sounds. Exam reveals no gallop and no friction rub.  No murmur heard.  Pulmonary/Chest: Effort normal and breath sounds normal. No respiratory distress. He has no wheezes.  Abdominal: Soft. Bowel sounds are normal. He exhibits no distension. There is no tenderness.  Lymphadenopathy:  He has no cervical adenopathy.  Neurological: He is alert and oriented to person, place, and time.  Skin: Skin is warm and dry. No rash noted. No erythema.  Psychiatric: He has a normal mood and affect. His behavior is normal.   Lab Results  Component Value Date   HEPBSAB Reactive (A) 08/07/2023   No results found for: RPR, LABRPR  CBC Lab Results  Component Value  Date   WBC 7.6 03/18/2024   RBC 3.05 (L) 03/18/2024   HGB 11.0 (L) 03/18/2024   HCT 34.5 (L) 03/18/2024   PLT 174 03/18/2024   MCV 113.1 (H) 03/18/2024   MCH 36.1 (H) 03/18/2024   MCHC 31.9 03/18/2024   RDW 18.0 (H) 03/18/2024   LYMPHSABS 0.3 (L) 03/18/2024   MONOABS 0.5 03/18/2024   EOSABS 0.0 03/18/2024    BMET Lab Results  Component Value Date   NA 130 (L) 03/18/2024   K 5.6 (H) 03/18/2024   CL 93 (L) 03/18/2024   CO2 19 (L) 03/18/2024   GLUCOSE 124 (H) 03/18/2024   BUN 53 (H) 03/18/2024   CREATININE 7.24 (H) 03/18/2024   CALCIUM  9.1 03/18/2024   GFRNONAA 7 (L) 03/18/2024      Assessment and Plan Presacral/pelvic abscess = polymicrobial but includes ESBL Ecoli. Currently not treated with his current regimen of doxy plus  augmentin . Plan to change him to receive IV abtx with  ESRD- M-W-F- switch 4 wk of ertapenem  1gm Stop oral abtx tomorrow  HD at [facility] - will call in and send in RX for ertapenem  1gm with hd on m-w-f- [phone].[facility]  Diagnosis: abscess  Culture Result: polymicrobial including esbl ecoli  Allergies  Allergen Reactions   Baclofen Other (See Comments)    Altered mental status, after accidental overdose     Cephalosporins Rash   Ciprofloxacin Itching and Rash   Wound Dressing Adhesive Rash    OPAT Orders Discharge antibiotics to be given via PICC line Discharge antibiotics: Per pharmacy protocol  ertapenem  1000mg  post HD M-W-F Aim for Vancomycin  trough 15-20 or AUC 400-550 (unless otherwise indicated) Duration: 4 wk End Date: Oct 24th   Clinic Follow Up Appt: 4 wk  @

## 2024-04-01 ENCOUNTER — Telehealth: Payer: Self-pay

## 2024-04-01 LAB — CBC WITH DIFFERENTIAL/PLATELET
Absolute Lymphocytes: 662 {cells}/uL — ABNORMAL LOW (ref 850–3900)
Absolute Monocytes: 672 {cells}/uL (ref 200–950)
Basophils Absolute: 37 {cells}/uL (ref 0–200)
Basophils Relative: 0.4 %
Eosinophils Absolute: 377 {cells}/uL (ref 15–500)
Eosinophils Relative: 4.1 %
HCT: 32.8 % — ABNORMAL LOW (ref 38.5–50.0)
Hemoglobin: 10.6 g/dL — ABNORMAL LOW (ref 13.2–17.1)
MCH: 36.1 pg — ABNORMAL HIGH (ref 27.0–33.0)
MCHC: 32.3 g/dL (ref 32.0–36.0)
MCV: 111.6 fL — ABNORMAL HIGH (ref 80.0–100.0)
MPV: 10.3 fL (ref 7.5–12.5)
Monocytes Relative: 7.3 %
Neutro Abs: 7452 {cells}/uL (ref 1500–7800)
Neutrophils Relative %: 81 %
Platelets: 158 Thousand/uL (ref 140–400)
RBC: 2.94 Million/uL — ABNORMAL LOW (ref 4.20–5.80)
RDW: 15.9 % — ABNORMAL HIGH (ref 11.0–15.0)
Total Lymphocyte: 7.2 %
WBC: 9.2 Thousand/uL (ref 3.8–10.8)

## 2024-04-01 LAB — SEDIMENTATION RATE: Sed Rate: 28 mm/h — ABNORMAL HIGH (ref 0–20)

## 2024-04-01 LAB — C-REACTIVE PROTEIN: CRP: 11.5 mg/L — ABNORMAL HIGH (ref <8.0)

## 2024-04-01 NOTE — Telephone Encounter (Signed)
 Per Dr. Luiz she has reached out to patient's dialysis center and communicated all orders.  Joshua Riley, CMA

## 2024-04-04 ENCOUNTER — Ambulatory Visit
Admission: RE | Admit: 2024-04-04 | Discharge: 2024-04-04 | Disposition: A | Source: Ambulatory Visit | Attending: General Surgery | Admitting: General Surgery

## 2024-04-04 ENCOUNTER — Ambulatory Visit
Admission: RE | Admit: 2024-04-04 | Discharge: 2024-04-04 | Disposition: A | Discharge status: Home / Self Care | Source: Ambulatory Visit | Attending: General Surgery | Admitting: General Surgery

## 2024-04-04 DIAGNOSIS — K611 Rectal abscess: Secondary | ICD-10-CM

## 2024-04-04 MED ORDER — IOPAMIDOL (ISOVUE-300) INJECTION 61%
30.0000 mL | Freq: Once | INTRAVENOUS | Status: AC | PRN
  Administered 2024-04-04: 7 mL

## 2024-04-04 MED ORDER — IOPAMIDOL (ISOVUE-300) INJECTION 61%
100.0000 mL | Freq: Once | INTRAVENOUS | Status: AC | PRN
  Administered 2024-04-04: 100 mL via INTRAVENOUS

## 2024-04-04 NOTE — Progress Notes (Signed)
 Referring Physician(s): Ramirez,Armando  Chief Complaint: The patient is seen in follow up today s/p presacral abscess drain placed 03/16/24  History of present illness:  Joshua Riley is an 88 y.o. male with a history of ESRD on hemodialysis, obstructive uropathy s/p left PCN placement, ulcerative colitis s/p colectomy with ileostomy, and chronic presacral fluid collection who is known to IR from previous PCN placement and transgluteal drain placements. Patient had undergone drain placements 07/2023 and 11/2023 with both drains becoming unintentionally displaced. At a clinic visit 12/18/23 the drain continued to have substantial output and injection revealed fistulization. The drain was downsized from a 10 fr to an 8 Fr. He was scheduled to return for follow up a few weeks later, but his drain became dislodged during that period.    A CT scan 12/30/23 which showed a residual or reaccumulated fluid collection in the posterior pelvis/presacral region. He was referred back to IR and on 01/07/24 he received an 8 fr drain into this fluid collection. He was evaluated by our team 02/19/24 and CT imaging showed a residual presacral fluid collection with the pigtail catheter located just outside of the collection. An attempted drain exchange was unsuccessful. His left PCN had not been exchanged since 10/22/23 and this was successfully exchanged during that clinic visit.   The patient was scheduled for outpatient placement of a new presacral abscess drain. Unfortunately, before he could be seen in IR he presented to the ED 02/22/24 due to a vascular access problem. He underwent a successful declot of his left thigh AV graft 02/23/24 and he received a new left transgluteal presacral abscess drain 02/24/24.   He presented to the ED 03/14/24 due drainage/redness at the catheter insertion site. The drain had actually been accidentally dislodged again several days prior. The family also reported mild confusion and chills.  CT imaging showed a residual fluid collection and IR was consulted again for drain placement. On 03/16/24 he received a new pelvic abscess drain, left transgluteal approach.   The patient presents to the clinic today for a drain evaluation. He is in his usual state of health and he reports a daily output of 2-3 ml. He is occasionally flushing the drain. He is currently on antibiotics.  Past Medical History:  Diagnosis Date   A-fib (HCC)    Anemia    Arthritis    Cancer (HCC)    Basal cell   COVID-19    2021   Dysrhythmia    Afib-controlled on eliquis    ESRD (end stage renal disease) (HCC) 10/22/2021   Glaucoma 11/18/2021   History of DVT (deep vein thrombosis)    Hydronephrosis    managed wtih a PCN   Idiopathic neuropathy 10/22/2021   lyrica     Ileostomy in place [facility])    Obstructive uropathy    With chronic left nephrostomy   Old retinal detachment, total or subtotal    Orthostatic hypotension 10/22/2021   Sleep apnea    does not need a machine   Stroke (HCC)    Ulcerative colitis (HCC)    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    Past Surgical History:  Procedure Laterality Date   BASAL CELL CARCINOMA EXCISION     10/23   COLON SURGERY     creation of j pouch     and subsequent takedown of j pouch   EYE SURGERY     IR CATHETER TUBE CHANGE  12/18/2023   IR CATHETER TUBE CHANGE  02/19/2024   IR CATHETER TUBE CHANGE  02/19/2024   IR NEPHROSTOMY EXCHANGE LEFT  12/10/2021   IR NEPHROSTOMY EXCHANGE LEFT  04/22/2022   IR NEPHROSTOMY EXCHANGE LEFT  07/29/2022   IR NEPHROSTOMY EXCHANGE LEFT  10/28/2022   IR NEPHROSTOMY EXCHANGE LEFT  02/05/2023   IR NEPHROSTOMY EXCHANGE LEFT  05/07/2023   IR NEPHROSTOMY EXCHANGE LEFT  06/25/2023   IR NEPHROSTOMY EXCHANGE LEFT  07/17/2023   IR NEPHROSTOMY EXCHANGE LEFT  10/22/2023   IR RADIOLOGIST EVAL & MGMT  11/25/2023   IR RADIOLOGIST EVAL & MGMT  12/18/2023   IR RADIOLOGIST EVAL & MGMT  02/19/2024   IR RADIOLOGIST EVAL &  MGMT  04/04/2024   IR THROMBECTOMY AV FISTULA W/THROMBOLYSIS/PTA INC/SHUNT/IMG LEFT Left 02/23/2024   IR US  GUIDE VASC ACCESS LEFT  02/23/2024   LEFT HEART CATH AND CORONARY ANGIOGRAPHY N/A 07/22/2022   Procedure: LEFT HEART CATH AND CORONARY ANGIOGRAPHY;  Surgeon: Swaziland, Peter M, MD;  Location: [facility] INVASIVE CV LAB;  Service: Cardiovascular;  Laterality: N/A;   REVISION OF ARTERIOVENOUS GORETEX GRAFT Left 05/06/2022   Procedure: REDO LEFT THIGH ARTERIOVENOUS 4-7 MM GORETEX GRAFT;  Surgeon: Eliza Lonni RAMAN, MD;  Location: [facility] OR;  Service: Vascular;  Laterality: Left;   SMALL INTESTINE SURGERY     TOTAL COLECTOMY      Allergies: Baclofen, Cephalosporins, Ciprofloxacin, and Wound dressing adhesive  Medications: Prior to Admission medications   Medication Sig Start Date End Date Taking? Authorizing Provider  amoxicillin -clavulanate (AUGMENTIN ) 500-125 MG tablet Take 1 tablet by mouth daily at 6 (six) AM. 03/18/24   Singh, Prashant K, MD  atorvastatin  (LIPITOR) 20 MG tablet Take 1 tablet (20 mg total) by mouth daily. 11/14/23   Sebastian Toribio GAILS, MD  calcitRIOL  (ROCALTROL ) 0.5 MCG capsule Take 1 capsule (0.5 mcg total) by mouth every Monday, Wednesday, and Friday with hemodialysis. 12/04/23   Danford, Lonni SQUIBB, MD  Cyanocobalamin  (VITAMIN B-12 PO) Take 1 capsule by mouth in the morning and at bedtime.    [provider]  dorzolamide  (TRUSOPT ) 2 % ophthalmic solution Place 1 drop into the right eye at bedtime. 06/17/23   [provider]  doxycycline  (VIBRA -TABS) 100 MG tablet Take 1 tablet (100 mg total) by mouth 2 (two) times daily. 03/18/24   Singh, Prashant K, MD  Doxylamine Succinate, Sleep, (SLEEP AID PO) Take 1 Dose by mouth at bedtime. Sleep aid of unknown name. OTC.    [provider]  ELIQUIS  2.5 MG TABS tablet Take 1 tablet (2.5 mg total) by mouth 2 (two) times daily. 02/17/24   Katrinka Garnette KIDD, MD  fludrocortisone  (FLORINEF ) 0.1 MG tablet Take 1 tablet  (0.1 mg total) by mouth daily. 12/04/23   Danford, Lonni SQUIBB, MD  folic acid  (FOLVITE ) 1 MG tablet Take 1 tablet (1 mg total) by mouth daily. 07/21/23   Regalado, Belkys A, MD  gabapentin  (NEURONTIN ) 300 MG capsule TAKE 1 CAPSULE BY MOUTH ONCE A DAY AT BEDTIME FOR NEUROPATHY 03/21/24   Katrinka Garnette KIDD, MD  hydrocortisone  (CORTEF ) 10 MG tablet Take 2 tablets (20 mg total) by mouth daily and take 1 tablet (10 mg total) by mouth every evening. 03/18/24   Dennise Lavada POUR, MD  hydrocortisone  (CORTEF ) 20 MG tablet Take 1 tablet (20 mg total) by mouth daily. 03/18/24   Singh, Prashant K, MD  midodrine  (PROAMATINE ) 10 MG tablet Take 3 tablets (30 mg total) by mouth 3 (three) times daily. 02/03/24   Hunsucker, Donnice SAUNDERS, MD  mometasone -formoterol  (DULERA )  200-5 MCG/ACT AERO Inhale 2 puffs into the lungs 2 (two) times daily. 09/08/23   Rosario Eland I, MD  multivitamin (RENA-VIT) TABS tablet Take 1 tablet by mouth daily.    [provider]  Omega Fatty Acids-Vitamins (OMEGA-3 GUMMIES) CHEW Chew 1,200 mg by mouth in the morning and at bedtime.    [provider]  pantoprazole  (PROTONIX ) 40 MG tablet Take 1 tablet (40 mg total) by mouth 2 (two) times daily. 11/13/23   Sebastian Toribio GAILS, MD  ramelteon  (ROZEREM ) 8 MG tablet Take 1 tablet (8 mg total) by mouth at bedtime. 10/27/23   Katrinka Garnette KIDD, MD  sertraline  (ZOLOFT ) 50 MG tablet Take 25 mg by mouth at bedtime.    [provider]  sevelamer  carbonate (RENVELA ) 800 MG tablet Take 1,600 mg by mouth 3 (three) times daily with meals.    [provider]  Sodium Chloride  Flush (SALINE FLUSH) 0.9 % SOLN Use 5 mLs by Intracatheter route daily as directed. 11/25/23   Caperilla, Marissa N, PA  timolol  (TIMOPTIC ) 0.5 % ophthalmic solution Place 1 drop into both eyes in the morning and at bedtime.    [provider]  traMADol  (ULTRAM ) 50 MG tablet Take 1 tablet (50 mg total) by mouth every 8 (eight) hours as needed (for  pain). 12/03/23   Danford, Lonni SQUIBB, MD  traMADol  (ULTRAM ) 50 MG tablet Take 1 tablet (50 mg total) by mouth every 8 (eight) hours as needed (for pain). 03/22/24   Katrinka Garnette KIDD, MD  TYLENOL  325 MG tablet Take 325-650 mg by mouth every 6 (six) hours as needed for mild pain (pain score 1-3) or headache.    [provider]     Family History  Problem Relation Age of Onset   Stroke Mother    Cancer Father    Esophageal cancer Brother     Social History   Socioeconomic History   Marital status: Widowed    Spouse name: Not on file   Number of children: Not on file   Years of education: Not on file   Highest education level: 12th grade  Occupational History   Not on file  Tobacco Use   Smoking status: Former    Current packs/day: 0.00    Average packs/day: 2.0 packs/day for 6.0 years (12.0 ttl pk-yrs)    Types: Cigarettes    Start date: 87    Quit date: 43    Years since quitting: 40.7    Passive exposure: Never   Smokeless tobacco: Never  Vaping Use   Vaping status: Never Used  Substance and Sexual Activity   Alcohol use: Yes    Alcohol/week: 5.0 standard drinks of alcohol    Types: 5 Shots of liquor per week    Comment: socially   Drug use: Never   Sexual activity: Not Currently  Other Topics Concern   Not on file  Social History Narrative   Widowed- lost wife of 13 years to pancreatic cancer- previously married 43 years. Son in [place] and daughter helping in [place]. 4 grandkids.       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       Hobbies: dinner with family- occasional martini   Social Drivers of Health   Financial Resource Strain: Low Risk  (10/26/2023)   Overall Financial Resource Strain (CARDIA)    Difficulty of Paying Living Expenses: Not hard at all  Food Insecurity: No Food Insecurity (03/22/2024)   Hunger  Vital Sign    Worried About Programme researcher, broadcasting/film/video in the Last Year: Never true    Ran Out of Food in the Last Year:  Never true  Transportation Needs: No Transportation Needs (03/22/2024)   PRAPARE - Administrator, Civil Service (Medical): No    Lack of Transportation (Non-Medical): No  Physical Activity: Insufficiently Active (10/26/2023)   Exercise Vital Sign    Days of Exercise per Week: 1 day    Minutes of Exercise per Session: 10 min  Stress: Stress Concern Present (10/26/2023)   [employer] of Occupational Health - Occupational Stress Questionnaire    Feeling of Stress : To some extent  Social Connections: Patient Declined (03/17/2024)   Social Connection and Isolation Panel    Frequency of Communication with Friends and Family: Patient declined    Frequency of Social Gatherings with Friends and Family: Patient declined    Attends Religious Services: Patient declined    Database administrator or Organizations: Patient declined    Attends Banker Meetings: Patient declined    Marital Status: Patient declined  Recent Concern: Social Connections - Socially Isolated (01/27/2024)   Social Connection and Isolation Panel    Frequency of Communication with Friends and Family: More than three times a week    Frequency of Social Gatherings with Friends and Family: More than three times a week    Attends Religious Services: Never    Database administrator or Organizations: No    Attends Banker Meetings: Never    Marital Status: Widowed     Vital Signs: There were no vitals taken for this visit.  Physical Exam Constitutional:      General: He is not in acute distress. Pulmonary:     Effort: Pulmonary effort is normal.  Abdominal:     Comments: Left transgluteal drain to gravity. There is a scant amount of tan fluid in the gravity bag. Drain easily flushed with some contrast leaking around the skin insertion site. Mild erythema around the drain insertion site.   Left PCN. Site is unremarkable.     Musculoskeletal:     Right lower leg: Edema present.      Left lower leg: Edema present.  Skin:    General: Skin is warm and dry.  Neurological:     Mental Status: He is alert and oriented to person, place, and time.        Labs:  CBC: Recent Labs    03/16/24 0404 03/17/24 0230 03/18/24 0500 03/31/24 1420  WBC 7.0 7.5 7.6 9.2  HGB 10.3* 10.8* 11.0* 10.6*  HCT 33.2* 34.1* 34.5* 32.8*  PLT 161 176 174 158    COAGS: Recent Labs    01/07/24 0841 01/25/24 1854 01/27/24 0024 02/23/24 0526 02/24/24 0423 03/15/24 0827 03/15/24 1336 03/16/24 0404 03/17/24 0230  INR 1.3* 1.3*  --  1.2  --  1.2  --   --   --   APTT  --   --    < > 124* 74*  --  101* 114* 81*   < > = values in this interval not displayed.    BMP: Recent Labs    03/14/24 1700 03/15/24 0321 03/16/24 0404 03/18/24 0730  NA 138 134* 134* 130*  K 4.6 5.5* 4.7 5.6*  CL 96* 98 95* 93*  CO2 25 20* 24 19*  GLUCOSE 128* 121* 111* 124*  BUN 29* 37* 47* 53*  CALCIUM  9.3 8.6* 8.8* 9.1  CREATININE 6.05* 6.37* 8.21* 7.24*  GFRNONAA 9* 8* 6* 7*    LIVER FUNCTION TESTS: Recent Labs    02/22/24 0804 02/23/24 0526 03/14/24 1700 03/15/24 0321 03/18/24 0730  BILITOT 0.8 0.9 1.2 1.3*  --   AST 21 14* 24 49*  --   ALT 24 21 23  32  --   ALKPHOS 85 86 73 73  --   PROT 6.5 6.3* 6.9 5.6*  --   ALBUMIN  3.3* 3.4* 3.7 3.0* 3.1*    Assessment:  88 year old male with a history of a chronic presacral abscess with most recent drain placement in IR 03/16/24  CT imaging today shows a small residual fluid collection. The drain was injected with contrast and there appears to be a fistula. Given the chronicity of the presacral abscess/drain, the residual fluid collection and the possible fistula the drain was left in place. The patient was instructed to discontinue flushing the drain and he will return to the clinic in 3 weeks for a repeat CT scan and drain injection. The patient will also be scheduled for a routine exchange of his left PCN which was last exchanged 02/19/24.    Signed: Warren JONELLE Dais, NP 04/04/2024, 2:24 PM   Please refer to Dr. Terrill attestation of this note for management and plan.

## 2024-04-05 ENCOUNTER — Other Ambulatory Visit: Payer: Self-pay | Admitting: General Surgery

## 2024-04-05 ENCOUNTER — Other Ambulatory Visit: Payer: Self-pay | Admitting: *Deleted

## 2024-04-05 DIAGNOSIS — K651 Peritoneal abscess: Secondary | ICD-10-CM

## 2024-04-05 DIAGNOSIS — L0291 Cutaneous abscess, unspecified: Secondary | ICD-10-CM

## 2024-04-05 DIAGNOSIS — Z936 Other artificial openings of urinary tract status: Secondary | ICD-10-CM

## 2024-04-05 NOTE — Patient Instructions (Signed)
 Visit Information  Thank you for taking time to visit with me today. Please don't hesitate to contact me if I can be of assistance to you before our next scheduled telephone appointment.  Our next appointment is by telephone on 04/12/24 @ 1045 am  Following is a copy of your care plan:   Goals Addressed             This Visit's Progress    VBCI Transitions of Care (TOC) Care Plan       Problems:  Recent Hospitalization for SIRS (systemic inflammatory response syndrome)  Spoke with daugher DPR [name] Brooke,  pt lives alone, daughter comes in daily to check on patient, pt goes to dialysis 3 x week, [facility] is working with pt,  pt is independent with care of ileostomy, has walker and cane, unable to complete medication reconciliation with pt as daughter provides oversight, she is not with pt and does not have the medications in front of her although reports  he is taking the antibiotics,  pt has hired aide M-F, 12-4 pm,  pt has presacral drain placed by IR (previously infected) 03/29/24- Pt reports he has contact number for interventional radiology (Dr. Johann) and infectious disease (Dr. Luiz), will follow up with ID on 9/25, primary care provider on 10/2 Home health nurse is with pt in the home, reports scant drainage from presacral drain, pt reports he has all medications and taking as prescribed 04/05/24- pt reports  doing well, had CT scan yesterday for further evaluation regarding presacral drain, no new concerns reported today  Goal:  Over the next 30 days, the patient will not experience hospital readmission  Interventions: SIRS (systemic inflammatory response syndrome) Evaluation of current treatment plan related to SIRS (systemic inflammatory response syndrome), self-management and patient's adherence to plan as established by provider. Discussed plans with patient for ongoing care management follow up and provided patient with direct contact information for care  management team Reviewed all upcoming scheduled appointments Reviewed importance of not missing any dialysis sessions Reviewed reportable signs/ symptoms and any issues with ileostomy Reviewed safety precautions, importance of using walker Reinforced signs /symptoms of infection with sacral drain and report to IR (Dr. Johann) and ID any issues  Patient Self Care Activities:  Attend all scheduled provider appointments Attend church or other social activities Call pharmacy for medication refills 3-7 days in advance of running out of medications Call provider office for new concerns or questions  Notify RN Care Manager of TOC call rescheduling needs Participate in Transition of Care Program/Attend TOC scheduled calls Take medications as prescribed   Report any issues/ infection with sacral drain immediately to interventional radiology Report any issues with dialysis vascular access, do not miss any dialysis sessions Follow renal diet/ fluid restrictions Fall prevention strategies: change positions slowly, use a walker or cane (per provider recommendations) when walking, keep walkways clear, have good lighting in room. It is important to contact your provider if you have any falls. Maintain muscle strength/tone by exercise per provider recommendations.   Plan:  Telephone follow up- 04/12/24 @ 1045 am        Patient verbalizes understanding of instructions and care plan provided today and agrees to view in MyChart. Active MyChart status and patient understanding of how to access instructions and care plan via MyChart confirmed with patient.     Telephone follow up appointment with care management team member scheduled for: 04/12/24 @ 1045 am  Please call the care guide team  at [phone] if you need to cancel or reschedule your appointment.   Please call the Suicide and Crisis Lifeline: 988 call the [place]  National Suicide Prevention Lifeline: (620)616-7942 or TTY: (432)388-7094 TTY  504-616-7957) to talk to a trained counselor call 1-800-273-TALK (toll free, 24 hour hotline) go to [facility] [address] [place] [phone]) call 911 if you are experiencing a Mental Health or Behavioral Health Crisis or need someone to talk to.  Mliss Creed [facility], BSN RN Care Manager/ Transition of Care [place]/ [facility] [phone]

## 2024-04-05 NOTE — Patient Outreach (Signed)
 Transition of Riley week 4  Visit Note  04/05/2024  Name: Joshua Riley MRN: [id number]          DOB: [birth year]  Situation: Patient enrolled in [facility] 30-day program. Visit completed with patient by telephone.   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   lyrica     Ileostomy in place [facility])    Obstructive uropathy    With chronic left nephrostomy   Old retinal detachment, total or subtotal    Orthostatic hypotension 10/22/2021   Sleep apnea    does not need a machine   Stroke (HCC)    Ulcerative colitis (HCC)    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    Assessment: Patient Reported Symptoms: Cognitive Cognitive Status: No symptoms reported, Alert and oriented to person, place, and time, Normal speech and language skills, Able to follow simple commands      Neurological Neurological Review of Symptoms: No symptoms reported    HEENT HEENT Symptoms Reported: No symptoms reported      Cardiovascular Cardiovascular Symptoms Reported: No symptoms reported    Respiratory Respiratory Symptoms Reported: No symptoms reported    Endocrine Endocrine Symptoms Reported: No symptoms reported Is patient diabetic?: No    Gastrointestinal Gastrointestinal Symptoms Reported: No symptoms reported Additional Gastrointestinal Details: ileostomy- pt maintains without difficulty Gastrointestinal Management Strategies: Adequate rest Gastrointestinal Self-Management Outcome: 4 (good)    Genitourinary Genitourinary Symptoms Reported: Other Other Genitourinary Symptoms: ESRD on dialysis Genitourinary Management Strategies:  Hemodialysis Hemodialysis Schedule: M,W,F Genitourinary Self-Management Outcome: 4 (good)  Integumentary Integumentary Symptoms Reported: Other Other Integumentary Symptoms: presacral drain, follows up with infectious disease, had CT scan yesterday per pt Skin Comment: pt states he will be receiving antibiotics at dialysis  Musculoskeletal Musculoskelatal Symptoms Reviewed: Difficulty walking Additional Musculoskeletal Details: walker, cane Musculoskeletal Management Strategies: Medical device, Routine screening Musculoskeletal Self-Management Outcome: 3 (uncertain)      Psychosocial Psychosocial Symptoms Reported: No symptoms reported         There were no vitals filed for this visit.  Medications Reviewed Today     Reviewed by Joshua Mliss LABOR, Joshua (Registered Nurse) on 04/05/24 at 1052  Med List Status: <None>   Medication Order Taking? Sig Documenting Provider Last Dose Status Informant  amoxicillin -clavulanate (AUGMENTIN ) 500-125 MG tablet [id number]  Take 1 tablet by mouth daily at 6 (six) AM.  Patient not taking: Reported on 04/05/2024   Singh, Prashant K, MD  Active   atorvastatin  (LIPITOR) 20 MG tablet [id number] Yes Take 1 tablet (20 mg total) by mouth daily. Sebastian Toribio GAILS, MD  Active Self, Pharmacy Records           Med Note JACKOLYN WADDELL DEL   Tue Mar 15, 2024  7:49 AM)    calcitRIOL  (ROCALTROL ) 0.5 MCG capsule [id number] Yes Take 1 capsule (0.5 mcg total) by mouth every Monday, Wednesday, and Friday with hemodialysis. Danford, Lonni SQUIBB, MD  Active Self, Pharmacy Records           Med Note McCook, Rhineland H  Tue Mar 15, 2024  7:50 AM)    Cyanocobalamin  (VITAMIN B-12 PO) [id number] Yes Take 1 capsule by mouth in the morning and at bedtime. [provider]  Active Self, Pharmacy Records  dorzolamide  (TRUSOPT ) 2 % ophthalmic solution [id number] Yes Place 1 drop into the right eye at bedtime. [provider]  Active Self, Pharmacy Records            Med Note STEFFI NIAN   Sat Nov 28, 2023 12:49 PM)    doxycycline  (VIBRA -TABS) 100 MG tablet [id number]  Take 1 tablet (100 mg total) by mouth 2 (two) times daily.  Patient not taking: Reported on 04/05/2024   Singh, Prashant K, MD  Active   Doxylamine Succinate, Sleep, (SLEEP AID PO) [id number] Yes Take 1 Dose by mouth at bedtime. Sleep aid of unknown name. OTC. [provider]  Active Self, Pharmacy Records  ELIQUIS  2.5 MG TABS tablet [id number] Yes Take 1 tablet (2.5 mg total) by mouth 2 (two) times daily. Katrinka Garnette KIDD, MD  Active Self, Pharmacy Records           Med Note JACKOLYN WADDELL DEL   Tue Mar 15, 2024  7:50 AM) Anthon give me a time for last dose yesterday morning.   fludrocortisone  (FLORINEF ) 0.1 MG tablet [id number]  Take 1 tablet (0.1 mg total) by mouth daily. Danford, Lonni SQUIBB, MD  Active Self, Pharmacy Records           Med Note JACKOLYN WADDELL DEL Pablo Feb 22, 2024 10:28 AM) Recent dispenses. Confirmed not taking.   folic acid  (FOLVITE ) 1 MG tablet [id number] Yes Take 1 tablet (1 mg total) by mouth daily. Regalado, Belkys A, MD  Active Self, Pharmacy Records           Med Note JACKOLYN WADDELL DEL Pablo Feb 22, 2024 10:28 AM) Recent dispenses. Confirmed not taking.    gabapentin  (NEURONTIN ) 300 MG capsule [id number] Yes TAKE 1 CAPSULE BY MOUTH ONCE A DAY AT BEDTIME FOR NEUROPATHY Katrinka Garnette KIDD, MD  Active   hydrocortisone  (CORTEF ) 10 MG tablet [id number] Yes Take 2 tablets (20 mg total) by mouth daily and take 1 tablet (10 mg total) by mouth every evening. Singh, Prashant K, MD  Active   hydrocortisone  (CORTEF ) 20 MG tablet [id number] Yes Take 1 tablet (20 mg total) by mouth daily. Singh, Prashant K, MD  Active   midodrine  (PROAMATINE ) 10 MG tablet [id number] Yes Take 3 tablets (30 mg total) by mouth 3 (three) times daily. Hunsucker, Donnice SAUNDERS, MD  Active Self, Pharmacy Records  mometasone -formoterol  (DULERA ) 200-5 MCG/ACT AERO [id number] Yes Inhale 2 puffs  into the lungs 2 (two) times daily. Rosario Leatrice FERNS, MD  Active Self, Pharmacy Records  multivitamin (RENA-VIT) TABS tablet [id number] Yes Take 1 tablet by mouth daily. [provider]  Active Self, Pharmacy Records  Omega Fatty Acids-Vitamins (OMEGA-3 GUMMIES) CHEW [id number] Yes Chew 1,200 mg by mouth in the morning and at bedtime. [provider]  Active Self, Pharmacy Records  pantoprazole  (PROTONIX ) 40 MG tablet [id number] Yes Take 1 tablet (40 mg total) by mouth 2 (two) times daily. Sebastian Toribio GAILS, MD  Active Self, Pharmacy Records           Med Note JACKOLYN WADDELL DEL Pablo Feb 22, 2024 10:31 AM) Recent dispenses. Confirmed not taking.    ramelteon  (ROZEREM ) 8 MG tablet [id number] Yes Take 1 tablet (8 mg total) by mouth at bedtime. Katrinka Garnette KIDD,  MD  Active Self, Pharmacy Records  sertraline  (ZOLOFT ) 50 MG tablet [id number]  Take 25 mg by mouth at bedtime.  Patient not taking: Reported on 04/05/2024   [provider]  Active Self, Pharmacy Records           Med Note JACKOLYN WADDELL VEAR Pablo Feb 22, 2024 10:31 AM) Recent dispenses. Confirmed not taking.    sevelamer  carbonate (RENVELA ) 800 MG tablet [id number] Yes Take 1,600 mg by mouth 3 (three) times daily with meals. [provider]  Active Self, Pharmacy Records           Med Note JACKOLYN WADDELL VEAR   Tue Mar 15, 2024  7:58 AM) Recent dispenses. Confirmed not taking.   Sodium Chloride  Flush (SALINE FLUSH) 0.9 % SOLN [id number] Yes Use 5 mLs by Intracatheter route daily as directed. Gery Daved SAILOR, PA  Active Self, Pharmacy Records  timolol  (TIMOPTIC ) 0.5 % ophthalmic solution [id number] Yes Place 1 drop into both eyes in the morning and at bedtime. [provider]  Active Self, Pharmacy Records           Med Note STEFFI NIAN   Sat Nov 28, 2023 12:48 PM)    traMADol  (ULTRAM ) 50 MG tablet [id number] Yes Take 1 tablet (50 mg total) by mouth every 8 (eight) hours as needed  (for pain). Jonel Lonni SQUIBB, MD  Active Self, Pharmacy Records  traMADol  (ULTRAM ) 50 MG tablet [id number]  Take 1 tablet (50 mg total) by mouth every 8 (eight) hours as needed (for pain).  Patient not taking: Reported on 04/05/2024   Katrinka Garnette KIDD, MD  Active   TYLENOL  325 MG tablet [id number] Yes Take 325-650 mg by mouth every 6 (six) hours as needed for mild pain (pain score 1-3) or headache. [provider]  Active Self, Pharmacy Records  Med List Note Donnajean Suzen SQUIBB, CPhT 06/02/22 1426): Dialysis Mon, Wed, Friday             Goals Addressed             This Visit's Progress    VBCI Transitions of Riley (TOC) Riley Plan       Problems:  Recent Hospitalization for SIRS (systemic inflammatory response syndrome)  Spoke with daugher DPR Joshua Riley,  pt lives alone, daughter comes in daily to check on patient, pt goes to dialysis 3 x week, [facility] is working with pt,  pt is independent with Riley of ileostomy, has walker and cane, unable to complete medication reconciliation with pt as daughter provides oversight, she is not with pt and does not have the medications in front of her although reports  he is taking the antibiotics,  pt has hired aide M-F, 12-4 pm,  pt has presacral drain placed by IR (previously infected) 03/29/24- Pt reports he has contact number for interventional radiology (Dr. Johann) and infectious disease (Dr. Luiz), will follow up with ID on 9/25, primary Riley provider on 10/2 Home health nurse is with pt in the home, reports scant drainage from presacral drain, pt reports he has all medications and taking as prescribed 04/05/24- pt reports  doing well, had CT scan yesterday for further evaluation regarding presacral drain, no new concerns reported today  Goal:  Over the next 30 days, the patient will not experience hospital readmission  Interventions: SIRS (systemic inflammatory response syndrome) Evaluation of current  treatment plan related to SIRS (systemic inflammatory response syndrome), self-management and patient's adherence to plan as established  by provider. Discussed plans with patient for ongoing Riley management follow up and provided patient with direct contact information for Riley management team Reviewed all upcoming scheduled appointments Reviewed importance of not missing any dialysis sessions Reviewed reportable signs/ symptoms and any issues with ileostomy Reviewed safety precautions, importance of using walker Reinforced signs /symptoms of infection with sacral drain and report to IR (Dr. Johann) and ID any issues  Patient Self Riley Activities:  Attend all scheduled provider appointments Attend church or other social activities Call pharmacy for medication refills 3-7 days in advance of running out of medications Call provider office for new concerns or questions  Notify Joshua Riley Manager of TOC call rescheduling needs Participate in Transition of Riley Program/Attend TOC scheduled calls Take medications as prescribed   Report any issues/ infection with sacral drain immediately to interventional radiology Report any issues with dialysis vascular access, do not miss any dialysis sessions Follow renal diet/ fluid restrictions Fall prevention strategies: change positions slowly, use a walker or cane (per provider recommendations) when walking, keep walkways clear, have good lighting in room. It is important to contact your provider if you have any falls. Maintain muscle strength/tone by exercise per provider recommendations.   Plan:  Telephone follow up- 04/12/24 @ 1045 am        Recommendation:   PCP Follow-up  Follow Up Plan:   Telephone follow-up 04/12/24 @ 1045 am  Mliss Creed [facility], BSN Joshua Riley [place]/ [facility] [phone]

## 2024-04-06 ENCOUNTER — Telehealth: Payer: Self-pay | Admitting: Family Medicine

## 2024-04-06 ENCOUNTER — Encounter: Payer: Self-pay | Admitting: Family Medicine

## 2024-04-06 ENCOUNTER — Encounter: Payer: Self-pay | Admitting: Internal Medicine

## 2024-04-06 NOTE — Telephone Encounter (Signed)
 Forms faxed back to [facility] 04/06/2024.   Copied from CRM #[id number]. Topic: General - Other >> Apr 06, 2024 12:52 PM Rosina BIRCH wrote: Reason for CRM: patient called stating he is need of his oxygen supplies and he only has one day supply left. Patient stated that [facility] called on last week regarding this. Patient stated he wanted us  to get it done [phone]

## 2024-04-07 ENCOUNTER — Ambulatory Visit (INDEPENDENT_AMBULATORY_CARE_PROVIDER_SITE_OTHER)

## 2024-04-07 ENCOUNTER — Ambulatory Visit (INDEPENDENT_AMBULATORY_CARE_PROVIDER_SITE_OTHER): Admitting: Family Medicine

## 2024-04-07 ENCOUNTER — Encounter: Payer: Self-pay | Admitting: Family Medicine

## 2024-04-07 VITALS — BP 128/78 | Temp 97.0°F | Ht 68.0 in | Wt 157.6 lb

## 2024-04-07 DIAGNOSIS — J90 Pleural effusion, not elsewhere classified: Secondary | ICD-10-CM

## 2024-04-07 DIAGNOSIS — K651 Peritoneal abscess: Secondary | ICD-10-CM

## 2024-04-07 DIAGNOSIS — I480 Paroxysmal atrial fibrillation: Secondary | ICD-10-CM

## 2024-04-07 DIAGNOSIS — E2740 Unspecified adrenocortical insufficiency: Secondary | ICD-10-CM

## 2024-04-07 DIAGNOSIS — I951 Orthostatic hypotension: Secondary | ICD-10-CM | POA: Diagnosis not present

## 2024-04-07 DIAGNOSIS — R238 Other skin changes: Secondary | ICD-10-CM

## 2024-04-07 MED ORDER — TRAMADOL HCL 50 MG PO TABS: 50.0000 mg | ORAL_TABLET | Freq: Two times a day (BID) | ORAL | 0 refills | Status: DC | PRN

## 2024-04-07 MED ORDER — MOMETASONE FURO-FORMOTEROL FUM 200-5 MCG/ACT IN AERO: 2.0000 | INHALATION_SPRAY | Freq: Two times a day (BID) | RESPIRATORY_TRACT | 5 refills | Status: DC

## 2024-04-07 NOTE — Progress Notes (Signed)
 Phone [phone] In person visit   Subjective:   Joshua Riley is a 88 y.o. year old very pleasant male patient who presents for/with See problem oriented charting Chief Complaint  Patient presents with   Medical Management of Chronic Issues    2 month follow up;    Leg Swelling    Right leg swelling, left leg hurts; possible bilateral infection;     Past Medical History-  Patient Active Problem List   Diagnosis Date Noted   History of small bowel obstruction 02/11/2024    Priority: High   Adrenal insufficiency 02/11/2024    Priority: High   DOE (dyspnea on exertion) 07/22/2022    Priority: High   Ulcerative colitis (HCC) 06/02/2022    Priority: High   End-stage renal disease on hemodialysis (HCC) 05/06/2022    Priority: High   Paroxysmal atrial fibrillation (HCC) 11/18/2021    Priority: High   Orthostatic hypotension 10/22/2021    Priority: High   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    Priority: Low   Abnormal bladder CT 03/23/2023    Priority: Low   Infection due to ESBL-producing Escherichia coli 03/23/2023    Priority: Low   Coag negative Staphylococcus bacteremia 06/06/2022    Priority: Low   SBO (small bowel obstruction) (HCC) 11/18/2021    Priority: Low   SIRS (systemic inflammatory response syndrome) (HCC) 03/14/2024   Dialysis complication 02/22/2024   Presacral Abscess 02/22/2024   Small bowel obstruction (HCC) 01/25/2024   Cerebrovascular disease 12/02/2023   Seizure like activity 11/28/2023   Acute pericarditis  11/11/2023   Abscess of abdominal cavity (HCC) 07/17/2023   Anastomotic leak of intestine 07/14/2023   Abscess of groin, left 07/07/2023   Macrocytic anemia 04/08/2023    Medications- reviewed and updated Current Outpatient Medications  Medication Sig Dispense Refill   atorvastatin  (LIPITOR) 20 MG tablet Take 1 tablet (20 mg total) by mouth daily. 90 tablet 0   calcitRIOL  (ROCALTROL ) 0.5 MCG capsule Take 1 capsule (0.5 mcg total) by mouth every Monday, Wednesday, and Friday with hemodialysis.     Cyanocobalamin  (VITAMIN B-12 PO) Take 1 capsule by mouth in the morning and at bedtime.     dorzolamide  (TRUSOPT ) 2 % ophthalmic solution Place 1 drop into the right eye at bedtime.     Doxylamine Succinate, Sleep, (SLEEP AID PO) Take 1 Dose by mouth at bedtime. Sleep aid of unknown name. OTC.     ELIQUIS  2.5 MG TABS tablet Take 1 tablet (2.5 mg total) by mouth 2 (two) times daily. 60 tablet 11   fludrocortisone  (FLORINEF ) 0.1 MG tablet Take 1 tablet (0.1 mg total) by mouth daily.     folic acid  (FOLVITE ) 1 MG tablet Take 1 tablet (1 mg total) by mouth daily. 30 tablet 0   gabapentin  (NEURONTIN ) 300 MG capsule TAKE 1 CAPSULE BY MOUTH ONCE A DAY AT BEDTIME FOR NEUROPATHY 90 capsule 3   hydrocortisone  (CORTEF ) 10 MG tablet Take 2 tablets (  20 mg total) by mouth daily and take 1 tablet (10 mg total) by mouth every evening. 30 tablet 0   hydrocortisone  (CORTEF ) 20 MG tablet Take 1 tablet (20 mg total) by mouth daily. 30 tablet 0   midodrine  (PROAMATINE ) 10 MG tablet Take 3 tablets (30 mg total) by mouth 3 (three) times daily. 270 tablet 1   multivitamin (RENA-VIT) TABS tablet Take 1 tablet by mouth daily.     Omega Fatty Acids-Vitamins (OMEGA-3 GUMMIES) CHEW Chew 1,200 mg by mouth in the morning and at bedtime.     pantoprazole  (PROTONIX ) 40 MG tablet Take 1 tablet (40 mg total) by mouth 2 (two) times daily. 60 tablet 1   ramelteon  (ROZEREM ) 8 MG tablet Take 1 tablet (8 mg total) by mouth at bedtime. 30  tablet 5   sevelamer  carbonate (RENVELA ) 800 MG tablet Take 1,600 mg by mouth 3 (three) times daily with meals.     Sodium Chloride  Flush (SALINE FLUSH) 0.9 % SOLN Use 5 mLs by Intracatheter route daily as directed. 150 mL 1   timolol  (TIMOPTIC ) 0.5 % ophthalmic solution Place 1 drop into both eyes in the morning and at bedtime.     TYLENOL  325 MG tablet Take 325-650 mg by mouth every 6 (six) hours as needed for mild pain (pain score 1-3) or headache.     mometasone -formoterol  (DULERA ) 200-5 MCG/ACT AERO Inhale 2 puffs into the lungs 2 (two) times daily. 1 each 5   traMADol  (ULTRAM ) 50 MG tablet Take 1 tablet (50 mg total) by mouth every 12 (twelve) hours as needed (for pain). 12 tablet 0   No current facility-administered medications for this visit.     Objective:  BP 128/78 (BP Location: Left Arm, Patient Position: Sitting, Cuff Size: Normal)   Temp (!) 97 F (36.1 C) (Temporal)   Ht 5' 8 (1.727 m)   Wt 157 lb 9.6 oz (71.5 kg)   BMI 23.96 kg/m  Gen: NAD, resting comfortably CV: irregularly irregular  Lungs: CTAB no crackles, wheeze, rhonchi Nephrostomy tubing and presacral drain both noted Ext: 1+ edema bilaterally that is pitting.  No warmth.  No significant superficial tenderness.  Swelling is equal.  Some erythema of lower extremities bilaterally.  Several small superficial ulcerations some weeping Skin: warm, dry     Assessment and Plan     # Hospital follow-up for sepsis likely related to chronic abscess of abdominal cavity S: Patient recently hospitalized from March 14, 2024 to March 18, 2024-they prefer for him to remain in the hospital for continued treatment but he strongly desired discharge  Prior to admission unfortunately he has presacral drain that got dislodged and then patient had progressive confusion and the site progressed with erythema concerning for infection-he also had worsening pain around the drain site prior to it being removed.  Concern for  potential sepsis with presacral area as source of infection.  Treated with stress dose steroids due to his adrenal insufficiency and empiric antibiotics.  Interventional radiology placed left gluteal drain on 03/16/2024.  Initial cultures were negative after time of discharge became positive for multidrug-resistant E. coli.  He had been discharged on Augmentin  and doxycycline .  Unfortunately patient had multidrug-resistant E. coli.  The doxycycline  and Augmentin  would cover Staphylococcus and Klebsiella but not the E. coli so we forwarded to his [facility] infectious disease team who then asked us  to have him see local infectious disease and he was seen on 03/31/2024 and he was changed to IV antibiotics ertapenem   1 g with dialysis and he received his first dose on 04/04/2024  Hospital team recommended CBC, CMP, magnesium , chest x-ray.  In the hospital he had small right pleural effusion and associated basilar airspace opacities with stable cardiomegaly and pulmonary vascular congestion  A/P: Sepsis appears resolved but ongoing intra-abdominal abscess infection-currently on ertapenem  but just started yesterday through hemodialysis-he is to continue this under the direction of infectious disease  His main question today was if he had a cellulitis on extremities-he does have some erythema but no warmth or tenderness and ertapenem  would cover most cellulitis cases.  I think is more likely related to fluid overload and he is going to see if they can draw off more fluid with dialysis but have also asked him to try to elevate his legs is much as possible.  Trying extra fluid is very difficult for him due to lower blood pressures/adrenal insufficiency  He also had a pleural effusion noted in the hospital as well as some airspace opacities-we will repeat chest x-ray to make sure no worsening-if not improving may need to consider CT particularly for opacities given aggressive antibiotics.  Has had repeat blood work with  dialysis so we opted out of that  Leg edema is bilateral and equal as well as redness which I think makes infection less likely as well as clotting less likely plus he is on chronic Eliquis  2.5 mg twice daily   # Hyortension/adrenal insufficiency-has required midodrine  and hydrocortisone - remains on these- continue current medications with midodrine  at high dose 30 mg 3 times a day and hydrocortisone  20 mg in the morning and 10 mg in the evening  # Atrial fibrillation- prior Dr. Hobart S: Rate controlled without medicine Anticoagulated with eliquis  2.5 mg BID A/P: Appropriately anticoagulated.  Rate controlled without medicine.  Appears to be in atrial fibrillation  #ESRD- sees Dr. Nanine on dialysis Monday Wednesday Friday and there he is receiving ertapenem  treatment  # colostomy bag-apparently in need of supplies-my team had faxed these over yesterday so he is going to check back with the company  #asthma- needs refill on dulera - refilled today  Recommended follow up: Return in about 2 months (around 06/07/2024) for followup or sooner if needed.Schedule b4 you leave. Future Appointments  Date Time Provider Department Center  04/12/2024 10:45 AM Aura Mliss LABOR, RN [facility] None  04/12/2024  1:55 PM Janene Boer, PA [facility]  04/27/2024  1:00 PM [facility] [place] [facility] 1 [facility]  04/27/2024  1:30 PM [facility] [place] [facility] 1 [facility]G [facility]  04/27/2024  1:30 PM [facility] [place] IR 1 [facility]  04/27/2024  2:00 PM [facility] [place] [facility] 1 [facility]BDG [facility]  05/12/2024  1:45 PM Luiz Channel, MD [facility]  06/09/2024  1:20 PM Katrinka Garnette KIDD, MD [facility]    Lab/Order associations:   ICD-10-CM   1. Abscess of abdominal cavity (HCC)  K65.1     2. Pleural effusion  J90 DG Chest 2 View    3. Skin irritation  R23.8     4. Orthostatic hypotension  I95.1     5. Adrenal insufficiency  E27.40     6. Paroxysmal atrial fibrillation (HCC)  I48.0        Meds ordered this encounter  Medications   mometasone -formoterol  (DULERA ) 200-5 MCG/ACT AERO    Sig: Inhale 2 puffs into the lungs 2 (two) times daily.    Dispense:  1 each    Refill:  5   traMADol  (  ULTRAM ) 50 MG tablet    Sig: Take 1 tablet (50 mg total) by mouth every 12 (twelve) hours as needed (for pain).    Dispense:  12 tablet    Refill:  0    Return precautions advised.  Garnette Lukes, MD

## 2024-04-07 NOTE — Patient Instructions (Addendum)
 I sent in tramadol  50 mg for very sparing use- but really want you to elevate those legs first and foremost- try just half tablet to start  Chest x-ray before you go  Recommended follow up: Return in about 2 months (around 06/07/2024) for followup or sooner if needed.Schedule b4 you leave.

## 2024-04-09 IMAGING — CT CT ABD-PELV W/O CM
2 of 4 series · 15 of 46 positions shown, 17 images · non-contrast
Comparison: None Available.

CLINICAL DATA: Nausea and vomiting with abdominal pain, initial
encounter



[Series 2: abd pel wo · axial · 0.72mm/px · z∈[+851,+1211]mm · 12 of 86 slices shown, 14 images]
[im 7/86  soft-tissue]
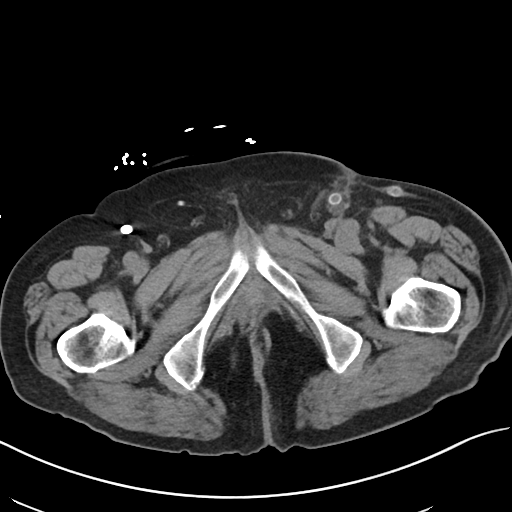
[im 7/86  bone]
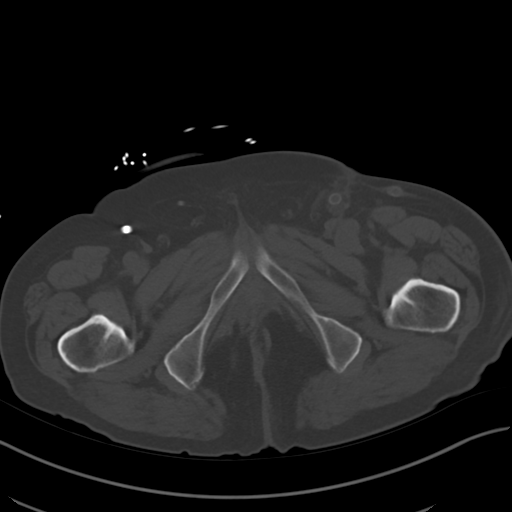
[im 14/86  soft-tissue]
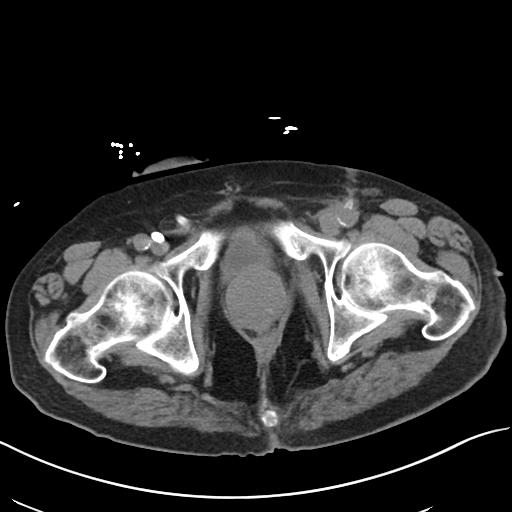
[im 20/86  soft-tissue]
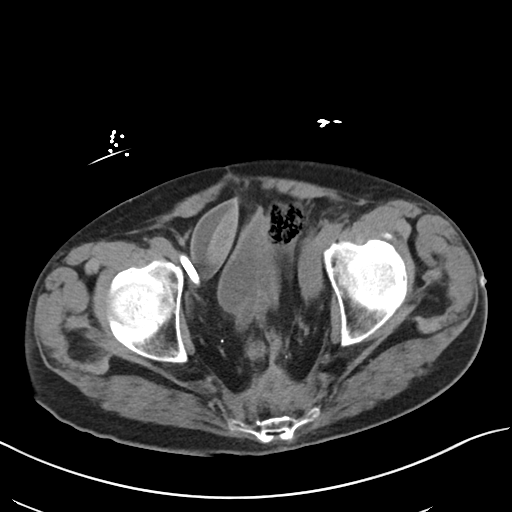
[im 27/86  soft-tissue]
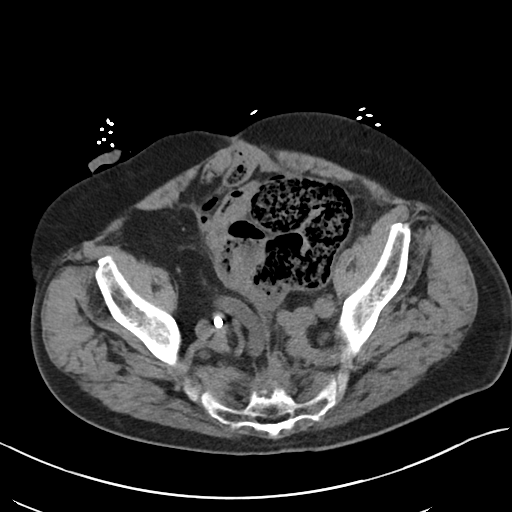
[im 33/86  soft-tissue]
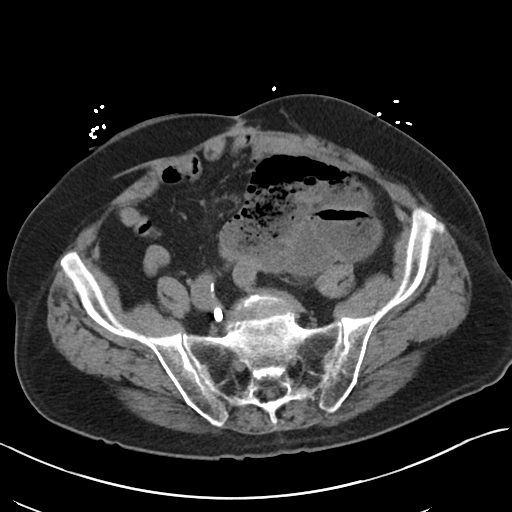
[im 40/86  soft-tissue]
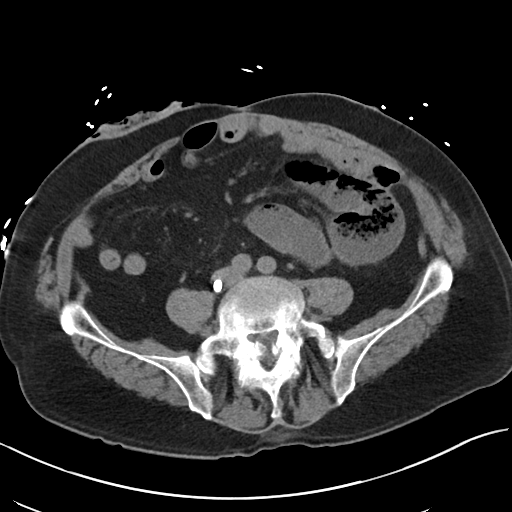
[im 46/86  soft-tissue]
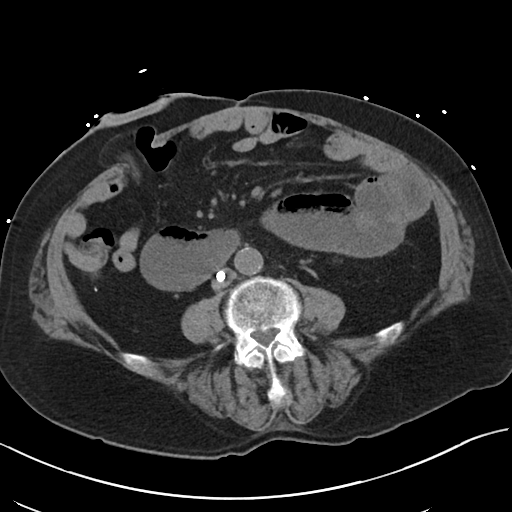
[im 53/86  soft-tissue]
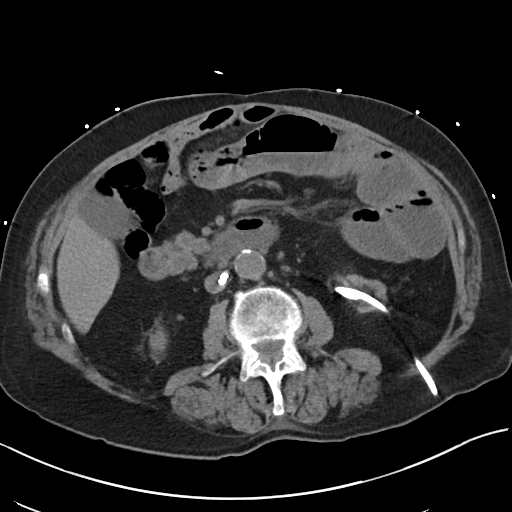
[im 59/86  soft-tissue]
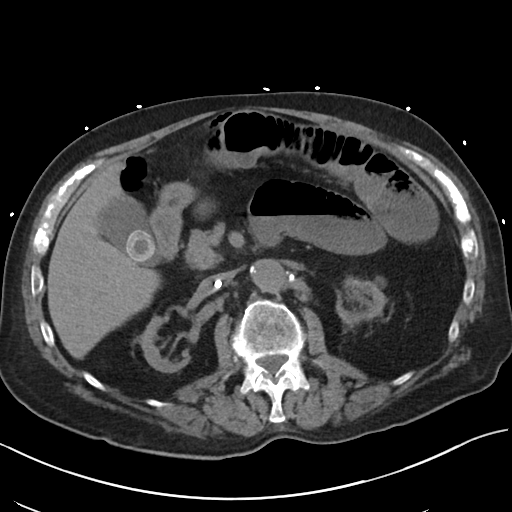
[im 59/86  bone]
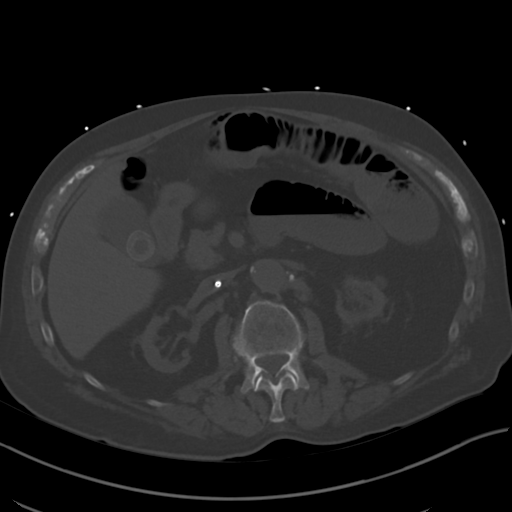
[im 66/86  soft-tissue]
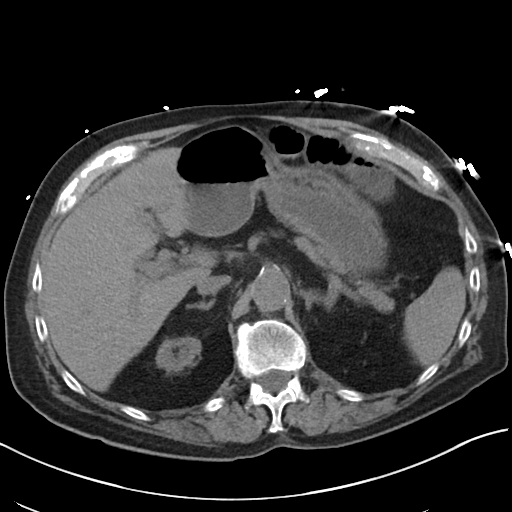
[im 72/86  soft-tissue]
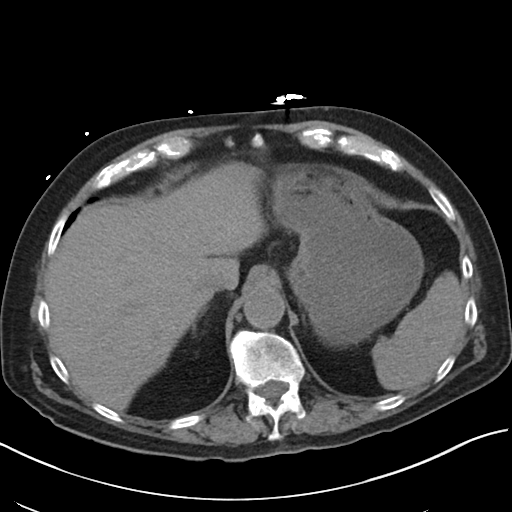
[im 79/86  soft-tissue]
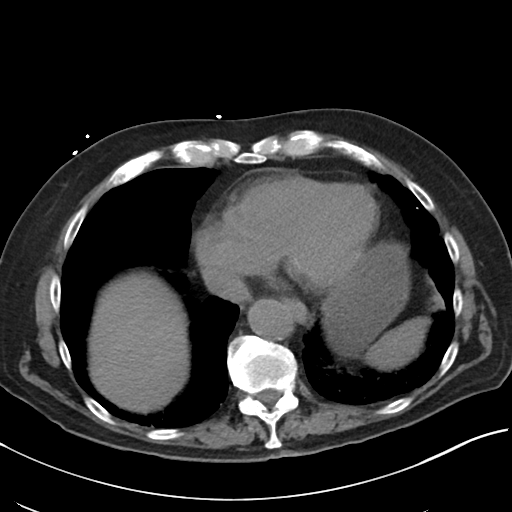

[Series 5: coronal · coronal · 0.77mm/px · 3 of 92 slices shown]
[im 31/92  soft-tissue]
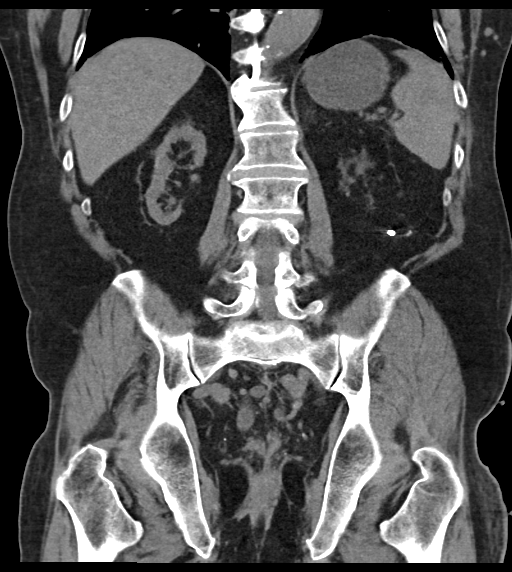
[im 41/92  soft-tissue]
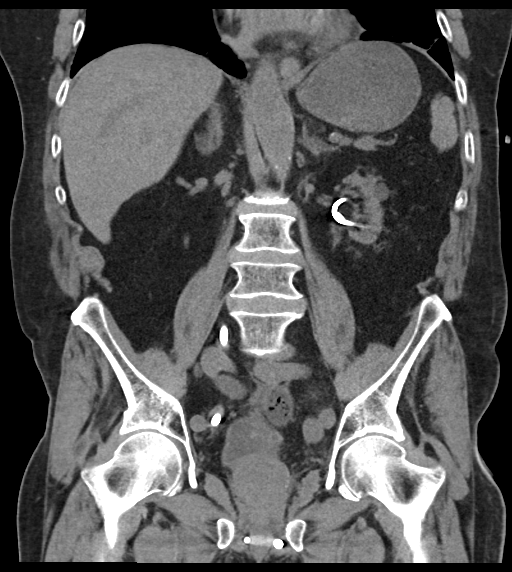
[im 51/92  soft-tissue]
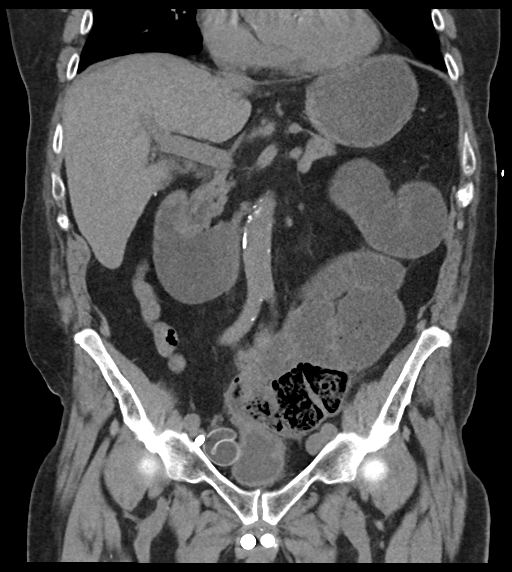

[15 of 46 positions shown; findings below may reference images not displayed]

FINDINGS: Lower chest: Mild atelectatic changes are noted in the left base.

Hepatobiliary: Liver is within normal limits. Gallbladder is well
distended with a single dependent gallstone. No biliary ductal
dilatation is seen.

Pancreas: Unremarkable. No pancreatic ductal dilatation or
surrounding inflammatory changes.

Spleen: Normal in size without focal abnormality.

Adrenals/Urinary Tract: Adrenal glands are within normal limits.
Kidneys are somewhat atrophied with evidence of left-sided
percutaneous nephrostomy catheter. Scattered small cysts are noted
bilaterally measuring less than 1 cm. No follow-up is recommended.
No renal calculi or obstructive changes are seen. Bladder is
decompressed.

Stomach/Bowel: The colon has been surgically removed. A right-sided
ileostomy is noted. The ileum just proximal to ileostomy appears
within normal limits although in the mid abdomen and anterior pelvis
there are multiple dilated loops of jejunum with significant
fecalization of small bowel contents in the distal jejunum. There
are changes suggestive of internal hernia causing the obstructive
change best noted on the coronal image number 63 of series 5.
Herniation of a small bowel loop into the ostomy defect just below
the ostomy loop is noted. The distal ileum appears within normal
limits.

Vascular/Lymphatic: Aortic atherosclerosis. No enlarged abdominal or
pelvic lymph nodes. Right femoral dialysis catheter is seen. This
extends to the level of the renal veins in the IVC. Dialysis loop is
noted in the left thigh which may be occluded. Correlate with
clinical findings.

Reproductive: Prostate is within normal limits. A penile prosthesis
reservoir is noted in the right lower quadrant.

Other: No abdominal wall hernia or abnormality. No abdominopelvic
ascites.

Musculoskeletal: Degenerative changes of the lumbar spine are noted.
IMPRESSION: Changes consistent with high-grade small bowel obstruction in the
distal jejunum with diffuse fecalization of small bowel contents and
jejunal dilation. Findings suggestive of internal hernia noted
although these changes may be related to significant adhesions. The
ileum and ileostomy appear within normal limits although herniation
of a loop of ileum adjacent to the ostomy is noted without
complicating factors. Surgical consultation is recommended.

Left nephrostomy catheter in satisfactory position.

Cholelithiasis without complicating factors.

Prior colectomy.

Changes consistent with the known history of end-stage renal
disease.

## 2024-04-11 ENCOUNTER — Other Ambulatory Visit: Payer: Self-pay

## 2024-04-11 ENCOUNTER — Encounter (HOSPITAL_BASED_OUTPATIENT_CLINIC_OR_DEPARTMENT_OTHER): Payer: Self-pay | Admitting: Urology

## 2024-04-11 ENCOUNTER — Emergency Department (HOSPITAL_BASED_OUTPATIENT_CLINIC_OR_DEPARTMENT_OTHER)

## 2024-04-11 ENCOUNTER — Inpatient Hospital Stay (HOSPITAL_BASED_OUTPATIENT_CLINIC_OR_DEPARTMENT_OTHER)
Admission: EM | Admit: 2024-04-11 | Discharge: 2024-04-15 | DRG: 640 | Disposition: A | Discharge status: Home / Self Care | Source: Ambulatory Visit | Attending: Family Medicine | Admitting: Family Medicine

## 2024-04-11 DIAGNOSIS — Z79899 Other long term (current) drug therapy: Secondary | ICD-10-CM

## 2024-04-11 DIAGNOSIS — Z932 Ileostomy status: Secondary | ICD-10-CM

## 2024-04-11 DIAGNOSIS — R64 Cachexia: Secondary | ICD-10-CM | POA: Diagnosis present

## 2024-04-11 DIAGNOSIS — I480 Paroxysmal atrial fibrillation: Secondary | ICD-10-CM | POA: Diagnosis present

## 2024-04-11 DIAGNOSIS — K5190 Ulcerative colitis, unspecified, without complications: Secondary | ICD-10-CM | POA: Diagnosis present

## 2024-04-11 DIAGNOSIS — Z66 Do not resuscitate: Secondary | ICD-10-CM | POA: Diagnosis present

## 2024-04-11 DIAGNOSIS — Z7951 Long term (current) use of inhaled steroids: Secondary | ICD-10-CM

## 2024-04-11 DIAGNOSIS — H409 Unspecified glaucoma: Secondary | ICD-10-CM | POA: Diagnosis present

## 2024-04-11 DIAGNOSIS — Z883 Allergy status to other anti-infective agents status: Secondary | ICD-10-CM

## 2024-04-11 DIAGNOSIS — D631 Anemia in chronic kidney disease: Secondary | ICD-10-CM | POA: Diagnosis present

## 2024-04-11 DIAGNOSIS — Z8616 Personal history of COVID-19: Secondary | ICD-10-CM

## 2024-04-11 DIAGNOSIS — Z1624 Resistance to multiple antibiotics: Secondary | ICD-10-CM | POA: Diagnosis present

## 2024-04-11 DIAGNOSIS — Z91048 Other nonmedicinal substance allergy status: Secondary | ICD-10-CM

## 2024-04-11 DIAGNOSIS — Z85828 Personal history of other malignant neoplasm of skin: Secondary | ICD-10-CM

## 2024-04-11 DIAGNOSIS — Z87891 Personal history of nicotine dependence: Secondary | ICD-10-CM

## 2024-04-11 DIAGNOSIS — I951 Orthostatic hypotension: Secondary | ICD-10-CM | POA: Diagnosis present

## 2024-04-11 DIAGNOSIS — I4821 Permanent atrial fibrillation: Secondary | ICD-10-CM | POA: Diagnosis present

## 2024-04-11 DIAGNOSIS — E2740 Unspecified adrenocortical insufficiency: Secondary | ICD-10-CM | POA: Diagnosis present

## 2024-04-11 DIAGNOSIS — Z7952 Long term (current) use of systemic steroids: Secondary | ICD-10-CM

## 2024-04-11 DIAGNOSIS — Z992 Dependence on renal dialysis: Secondary | ICD-10-CM

## 2024-04-11 DIAGNOSIS — M7989 Other specified soft tissue disorders: Principal | ICD-10-CM

## 2024-04-11 DIAGNOSIS — A419 Sepsis, unspecified organism: Secondary | ICD-10-CM | POA: Diagnosis not present

## 2024-04-11 DIAGNOSIS — Z823 Family history of stroke: Secondary | ICD-10-CM

## 2024-04-11 DIAGNOSIS — Z936 Other artificial openings of urinary tract status: Secondary | ICD-10-CM

## 2024-04-11 DIAGNOSIS — N2581 Secondary hyperparathyroidism of renal origin: Secondary | ICD-10-CM | POA: Diagnosis present

## 2024-04-11 DIAGNOSIS — Z8673 Personal history of transient ischemic attack (TIA), and cerebral infarction without residual deficits: Secondary | ICD-10-CM

## 2024-04-11 DIAGNOSIS — E8770 Fluid overload, unspecified: Secondary | ICD-10-CM | POA: Diagnosis not present

## 2024-04-11 DIAGNOSIS — A498 Other bacterial infections of unspecified site: Secondary | ICD-10-CM | POA: Diagnosis present

## 2024-04-11 DIAGNOSIS — Z86718 Personal history of other venous thrombosis and embolism: Secondary | ICD-10-CM

## 2024-04-11 DIAGNOSIS — Z9049 Acquired absence of other specified parts of digestive tract: Secondary | ICD-10-CM

## 2024-04-11 DIAGNOSIS — J45909 Unspecified asthma, uncomplicated: Secondary | ICD-10-CM | POA: Diagnosis present

## 2024-04-11 DIAGNOSIS — Z6823 Body mass index (BMI) 23.0-23.9, adult: Secondary | ICD-10-CM

## 2024-04-11 DIAGNOSIS — K5900 Constipation, unspecified: Secondary | ICD-10-CM | POA: Diagnosis present

## 2024-04-11 DIAGNOSIS — Z933 Colostomy status: Secondary | ICD-10-CM

## 2024-04-11 DIAGNOSIS — R600 Localized edema: Secondary | ICD-10-CM | POA: Diagnosis present

## 2024-04-11 DIAGNOSIS — J90 Pleural effusion, not elsewhere classified: Secondary | ICD-10-CM | POA: Diagnosis present

## 2024-04-11 DIAGNOSIS — M4628 Osteomyelitis of vertebra, sacral and sacrococcygeal region: Secondary | ICD-10-CM | POA: Diagnosis present

## 2024-04-11 DIAGNOSIS — B9620 Unspecified Escherichia coli [E. coli] as the cause of diseases classified elsewhere: Secondary | ICD-10-CM | POA: Diagnosis present

## 2024-04-11 DIAGNOSIS — N186 End stage renal disease: Secondary | ICD-10-CM | POA: Diagnosis present

## 2024-04-11 DIAGNOSIS — L02818 Cutaneous abscess of other sites: Secondary | ICD-10-CM | POA: Diagnosis present

## 2024-04-11 DIAGNOSIS — Z881 Allergy status to other antibiotic agents status: Secondary | ICD-10-CM

## 2024-04-11 DIAGNOSIS — N139 Obstructive and reflux uropathy, unspecified: Secondary | ICD-10-CM | POA: Diagnosis present

## 2024-04-11 DIAGNOSIS — G609 Hereditary and idiopathic neuropathy, unspecified: Secondary | ICD-10-CM | POA: Diagnosis present

## 2024-04-11 DIAGNOSIS — Z7901 Long term (current) use of anticoagulants: Secondary | ICD-10-CM

## 2024-04-11 LAB — CBC WITH DIFFERENTIAL/PLATELET
Abs Immature Granulocytes: 0.15 K/uL — ABNORMAL HIGH (ref 0.00–0.07)
Basophils Absolute: 0 K/uL (ref 0.0–0.1)
Basophils Relative: 1 %
Eosinophils Absolute: 0.2 K/uL (ref 0.0–0.5)
Eosinophils Relative: 3 %
HCT: 34.4 % — ABNORMAL LOW (ref 39.0–52.0)
Hemoglobin: 11.1 g/dL — ABNORMAL LOW (ref 13.0–17.0)
Immature Granulocytes: 2 %
Lymphocytes Relative: 7 %
Lymphs Abs: 0.5 K/uL — ABNORMAL LOW (ref 0.7–4.0)
MCH: 37.2 pg — ABNORMAL HIGH (ref 26.0–34.0)
MCHC: 32.3 g/dL (ref 30.0–36.0)
MCV: 115.4 fL — ABNORMAL HIGH (ref 80.0–100.0)
Monocytes Absolute: 0.7 K/uL (ref 0.1–1.0)
Monocytes Relative: 11 %
Neutro Abs: 4.9 K/uL (ref 1.7–7.7)
Neutrophils Relative %: 76 %
Platelets: 191 K/uL (ref 150–400)
RBC: 2.98 MIL/uL — ABNORMAL LOW (ref 4.22–5.81)
RDW: 18.5 % — ABNORMAL HIGH (ref 11.5–15.5)
WBC: 6.4 K/uL (ref 4.0–10.5)
nRBC: 4.5 % — ABNORMAL HIGH (ref 0.0–0.2)

## 2024-04-11 LAB — COMPREHENSIVE METABOLIC PANEL WITH GFR
ALT: 23 U/L (ref 0–44)
AST: 42 U/L — ABNORMAL HIGH (ref 15–41)
Albumin: 4.2 g/dL (ref 3.5–5.0)
Alkaline Phosphatase: 140 U/L — ABNORMAL HIGH (ref 38–126)
Anion gap: 19 — ABNORMAL HIGH (ref 5–15)
BUN: 41 mg/dL — ABNORMAL HIGH (ref 8–23)
CO2: 21 mmol/L — ABNORMAL LOW (ref 22–32)
Calcium: 10 mg/dL (ref 8.9–10.3)
Chloride: 102 mmol/L (ref 98–111)
Creatinine, Ser: 5.49 mg/dL — ABNORMAL HIGH (ref 0.61–1.24)
GFR, Estimated: 10 mL/min — ABNORMAL LOW (ref >60)
Glucose, Bld: 115 mg/dL — ABNORMAL HIGH (ref 70–99)
Potassium: 5.4 mmol/L — ABNORMAL HIGH (ref 3.5–5.1)
Sodium: 141 mmol/L (ref 135–145)
Total Bilirubin: 0.5 mg/dL (ref 0.0–1.2)
Total Protein: 6.7 g/dL (ref 6.5–8.1)

## 2024-04-11 LAB — LACTIC ACID, PLASMA: Lactic Acid, Venous: 1.7 mmol/L (ref 0.5–1.9)

## 2024-04-11 IMAGING — DX DG ABD PORTABLE 1V
1 series · 3 of 3 positions shown · non-contrast
Comparison: 11/18/2021

CLINICAL DATA: 8 hour small-bowel follow up

EXAM:
PORTABLE ABDOMEN - 1 VIEW

[Series 1: abdomen · 0.14mm/px · 3 of 3 slices shown]
[im 1/3]
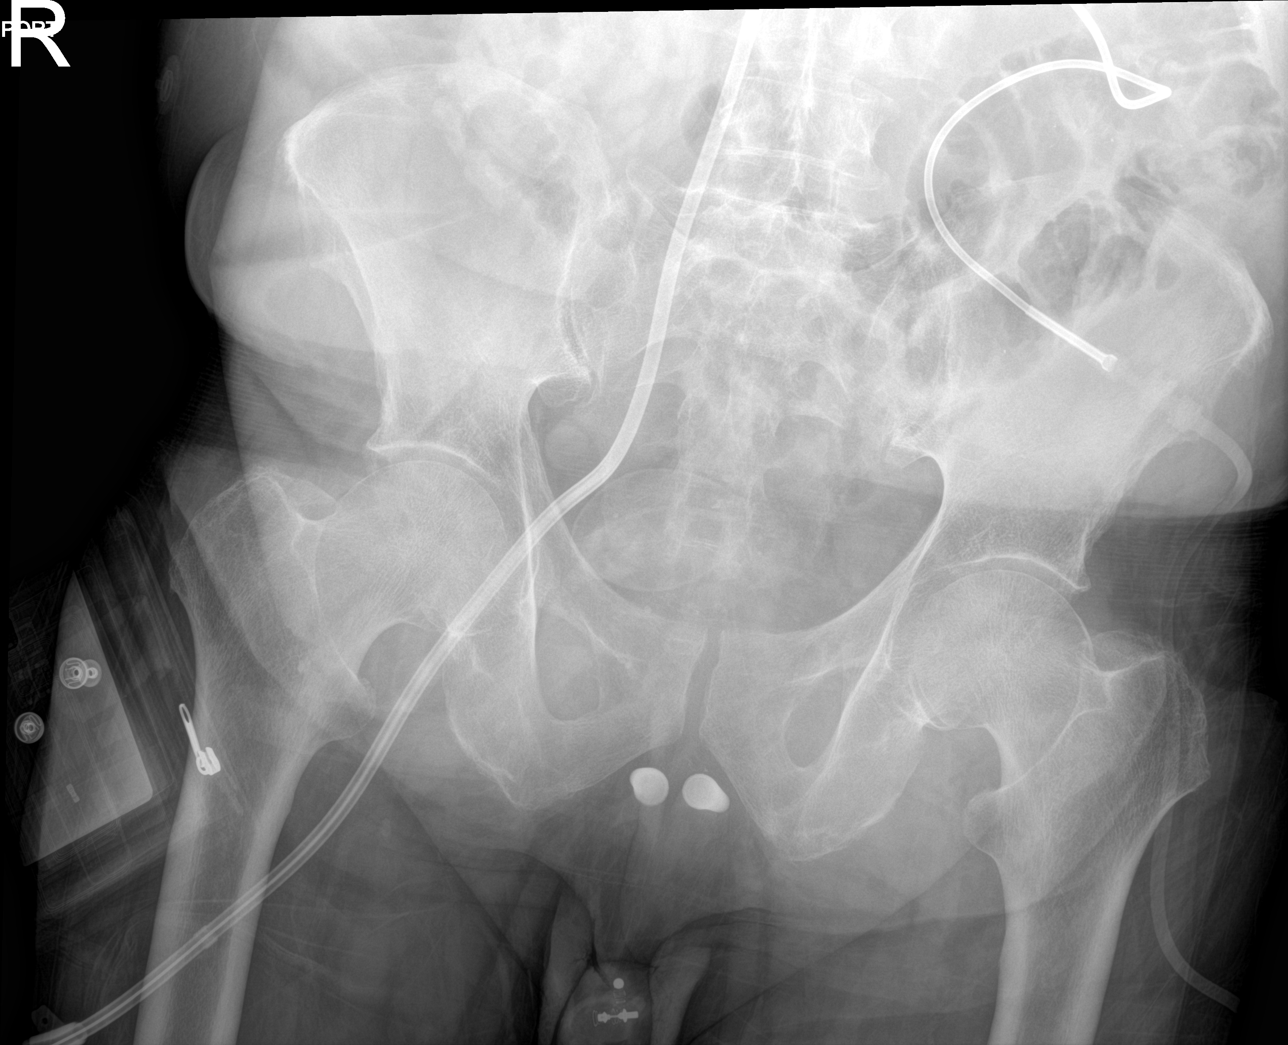
[im 2/3]
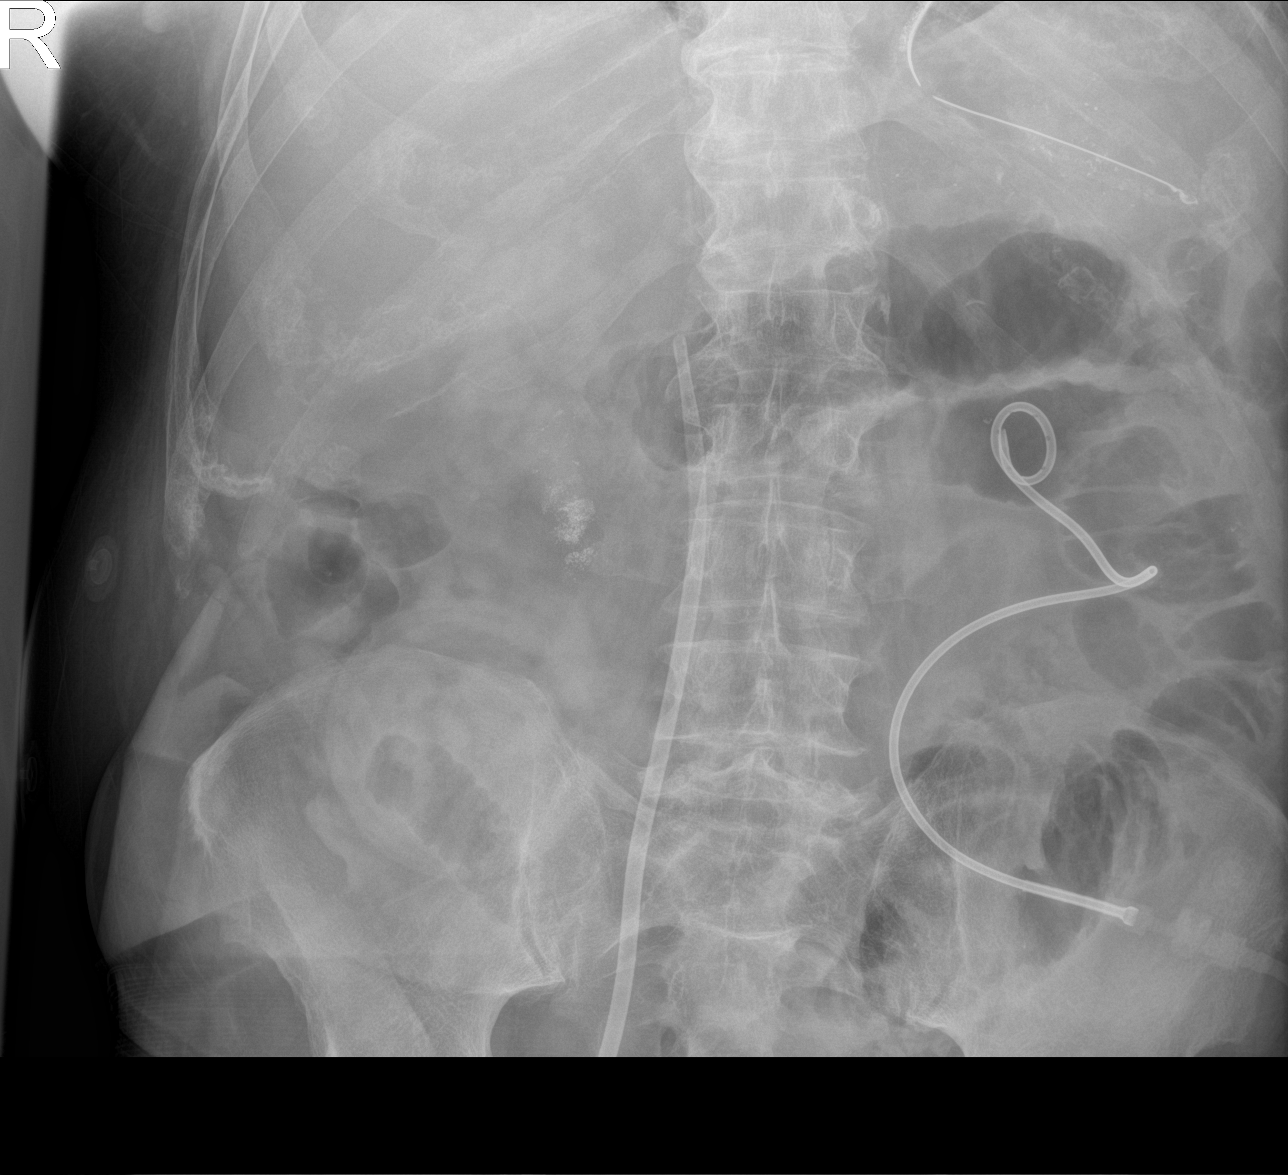
[im 3/3]
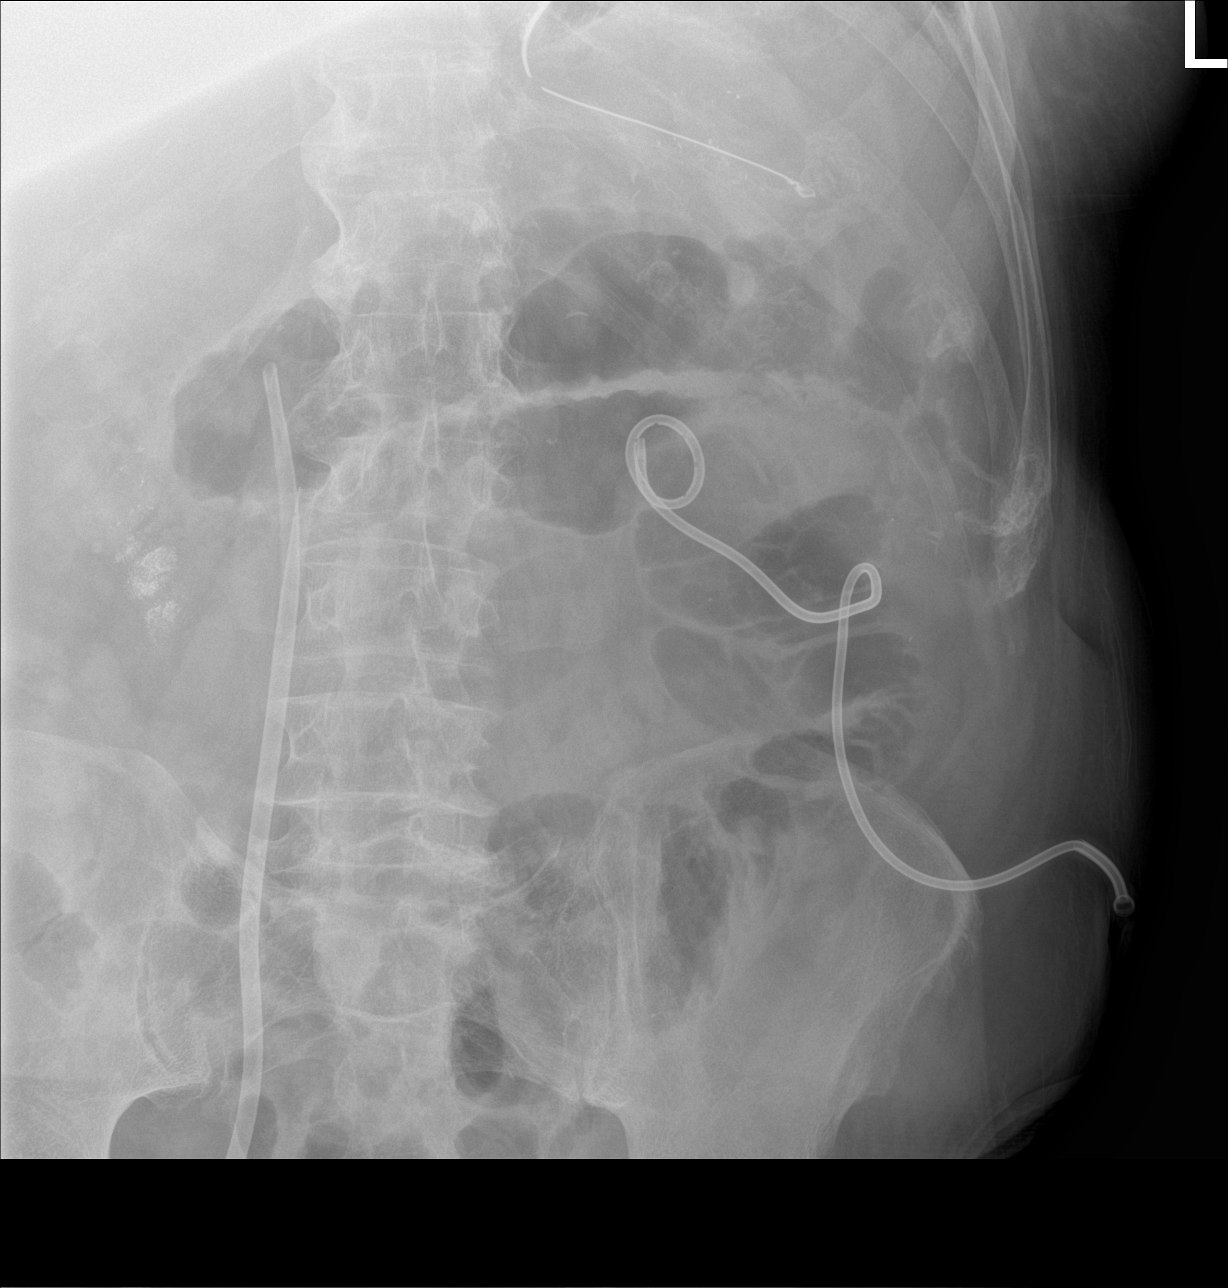

[3 of 3 positions shown; findings below may reference images not displayed]

FINDINGS: Gastric catheter remains in the stomach. Left nephrostomy catheter
and dialysis catheter are noted. Ileostomy is again noted in the
right mid abdomen. Persistent small bowel dilatation is noted.
Administered contrast appears to lie within the ileostomy back.
IMPRESSION: Persistent small bowel dilatation. Contrast material appears to lie
within the ileostomy bag consistent with a partial small bowel
obstruction.

## 2024-04-11 MED ORDER — VANCOMYCIN HCL IN DEXTROSE 1-5 GM/200ML-% IV SOLN
1000.0000 mg | Freq: Once | INTRAVENOUS | Status: AC
  Administered 2024-04-12: 1000 mg via INTRAVENOUS
  Filled 2024-04-11: qty 200

## 2024-04-11 MED ORDER — SODIUM CHLORIDE 0.9 % IV BOLUS
250.0000 mL | Freq: Once | INTRAVENOUS | Status: AC
  Administered 2024-04-11: 250 mL via INTRAVENOUS

## 2024-04-11 MED ORDER — VANCOMYCIN HCL 500 MG IV SOLR
500.0000 mg | Freq: Once | INTRAVENOUS | Status: AC
  Administered 2024-04-12: 500 mg via INTRAVENOUS

## 2024-04-11 MED ORDER — SODIUM CHLORIDE 0.9 % IV BOLUS
250.0000 mL | Freq: Once | INTRAVENOUS | Status: AC
  Administered 2024-04-12: 250 mL via INTRAVENOUS

## 2024-04-11 MED ORDER — HYDROCORTISONE SOD SUC (PF) 100 MG IJ SOLR
100.0000 mg | Freq: Once | INTRAMUSCULAR | Status: AC
  Administered 2024-04-12: 100 mg via INTRAVENOUS
  Filled 2024-04-11: qty 2

## 2024-04-11 NOTE — ED Provider Notes (Signed)
 [place] EMERGENCY DEPARTMENT AT [facility] Provider Note   CSN: [id number] Arrival date & time: 04/11/24  2026     Patient presents with: Leg Swelling   Joshua Riley is a 88 y.o. male.  {Add pertinent medical, surgical, social history, OB history to HPI:32947} Patient is a 88 year old male with a history of end-stage renal disease on dialysis, atrial fibrillation on Eliquis , adrenal insufficiency and chronic pelvic abscesses.  He was admitted last month for recurrent abscesses.  His sacral drains had become dislodged and he became septic.  He currently has drains in place and is receiving ertapenem  1 g with dialysis treatments.  His cultures have grown out drug-resistant E. coli.  He comes in with his daughter who states that he has had progressive redness and swelling of his lower legs bilaterally with increased pain to the legs.  He has had weeping from the legs.  He has not had a known fever but he is having chills.  He saw his PCP on October 2 but at that time felt to be more fluid overload rather than cellulitis.  They were going to try to take off some extra fluid at dialysis and keep his legs elevated but his daughter said they have progressively gotten worse since then.  She is concerned about cellulitis.  They state that he does not commonly have swelling of his legs.  He had this happen once before with cellulitis.  He denies any abdominal pain.  His sacral drains are in place and have had decreased output.  His doctors had indicated to the patient's daughter that they hopefully could be removed soon.       Prior to Admission medications   Medication Sig Start Date End Date Taking? Authorizing Provider  atorvastatin  (LIPITOR) 20 MG tablet Take 1 tablet (20 mg total) by mouth daily. 11/14/23   Sebastian Toribio GAILS, MD  calcitRIOL  (ROCALTROL ) 0.5 MCG capsule Take 1 capsule (0.5 mcg total) by mouth every Monday, Wednesday, and Friday with hemodialysis. 12/04/23   Danford,  Lonni SQUIBB, MD  Cyanocobalamin  (VITAMIN B-12 PO) Take 1 capsule by mouth in the morning and at bedtime.    [provider]  dorzolamide  (TRUSOPT ) 2 % ophthalmic solution Place 1 drop into the right eye at bedtime. 06/17/23   [provider]  Doxylamine Succinate, Sleep, (SLEEP AID PO) Take 1 Dose by mouth at bedtime. Sleep aid of unknown name. OTC.    [provider]  ELIQUIS  2.5 MG TABS tablet Take 1 tablet (2.5 mg total) by mouth 2 (two) times daily. 02/17/24   Katrinka Garnette KIDD, MD  fludrocortisone  (FLORINEF ) 0.1 MG tablet Take 1 tablet (0.1 mg total) by mouth daily. 12/04/23   Danford, Lonni SQUIBB, MD  folic acid  (FOLVITE ) 1 MG tablet Take 1 tablet (1 mg total) by mouth daily. 07/21/23   Regalado, Belkys A, MD  gabapentin  (NEURONTIN ) 300 MG capsule TAKE 1 CAPSULE BY MOUTH ONCE A DAY AT BEDTIME FOR NEUROPATHY 03/21/24   Katrinka Garnette KIDD, MD  hydrocortisone  (CORTEF ) 10 MG tablet Take 2 tablets (20 mg total) by mouth daily and take 1 tablet (10 mg total) by mouth every evening. 03/18/24   Singh, Prashant K, MD  hydrocortisone  (CORTEF ) 20 MG tablet Take 1 tablet (20 mg total) by mouth daily. 03/18/24   Singh, Prashant K, MD  midodrine  (PROAMATINE ) 10 MG tablet Take 3 tablets (30 mg total) by mouth 3 (three) times daily. 02/03/24   Hunsucker, Donnice SAUNDERS, MD  mometasone -formoterol  (DULERA )  200-5 MCG/ACT AERO Inhale 2 puffs into the lungs 2 (two) times daily. 04/07/24   Katrinka Garnette KIDD, MD  multivitamin (RENA-VIT) TABS tablet Take 1 tablet by mouth daily.    [provider]  Omega Fatty Acids-Vitamins (OMEGA-3 GUMMIES) CHEW Chew 1,200 mg by mouth in the morning and at bedtime.    [provider]  pantoprazole  (PROTONIX ) 40 MG tablet Take 1 tablet (40 mg total) by mouth 2 (two) times daily. 11/13/23   Sebastian Toribio GAILS, MD  ramelteon  (ROZEREM ) 8 MG tablet Take 1 tablet (8 mg total) by mouth at bedtime. 10/27/23   Katrinka Garnette KIDD, MD  sevelamer  carbonate  (RENVELA ) 800 MG tablet Take 1,600 mg by mouth 3 (three) times daily with meals.    [provider]  Sodium Chloride  Flush (SALINE FLUSH) 0.9 % SOLN Use 5 mLs by Intracatheter route daily as directed. 11/25/23   Caperilla, Marissa N, PA  timolol  (TIMOPTIC ) 0.5 % ophthalmic solution Place 1 drop into both eyes in the morning and at bedtime.    [provider]  traMADol  (ULTRAM ) 50 MG tablet Take 1 tablet (50 mg total) by mouth every 12 (twelve) hours as needed (for pain). 04/07/24   Katrinka Garnette KIDD, MD  TYLENOL  325 MG tablet Take 325-650 mg by mouth every 6 (six) hours as needed for mild pain (pain score 1-3) or headache.    [provider]    Allergies: Baclofen, Cephalosporins, Ciprofloxacin, and Wound dressing adhesive    Review of Systems  Constitutional:  Positive for chills and fatigue. Negative for diaphoresis and fever.  HENT:  Negative for congestion, rhinorrhea and sneezing.   Eyes: Negative.   Respiratory:  Negative for cough, chest tightness and shortness of breath.   Cardiovascular:  Positive for leg swelling. Negative for chest pain.  Gastrointestinal:  Negative for abdominal pain, diarrhea, nausea and vomiting.  Genitourinary:  Negative for difficulty urinating, flank pain and frequency.  Musculoskeletal:  Negative for arthralgias and back pain.  Skin:  Negative for rash.  Neurological:  Positive for weakness (Generalized). Negative for dizziness, speech difficulty, numbness and headaches.    Updated Vital Signs BP 94/63   Pulse 95   Temp (!) 97.5 F (36.4 C) (Oral)   Resp 17   Ht 5' 8 (1.727 m)   Wt 71.5 kg   SpO2 98%   BMI 23.97 kg/m   Physical Exam Constitutional:      Appearance: He is well-developed.  HENT:     Head: Normocephalic and atraumatic.  Eyes:     Pupils: Pupils are equal, round, and reactive to light.  Cardiovascular:     Rate and Rhythm: Normal rate and regular rhythm.     Heart sounds: Normal heart sounds.   Pulmonary:     Effort: Pulmonary effort is normal. No respiratory distress.     Breath sounds: Normal breath sounds. No wheezing or rales.  Chest:     Chest wall: No tenderness.  Abdominal:     General: Bowel sounds are normal.     Palpations: Abdomen is soft.     Tenderness: There is no abdominal tenderness. There is no guarding or rebound.  Musculoskeletal:        General: Normal range of motion.     Cervical back: Normal range of motion and neck supple.     Comments: 2-3+ edema to lower extremities bilaterally.  There is erythema of both extremities from the knees down.  There is weeping wounds on the  legs.  Pedal pulses are intact by Doppler.  Lymphadenopathy:     Cervical: No cervical adenopathy.  Skin:    General: Skin is warm and dry.     Findings: No rash.  Neurological:     Mental Status: He is alert and oriented to person, place, and time.     (all labs ordered are listed, but only abnormal results are displayed) Labs Reviewed  COMPREHENSIVE METABOLIC PANEL WITH GFR - Abnormal; Notable for the following components:      Result Value   Potassium 5.4 (*)    CO2 21 (*)    Glucose, Bld 115 (*)    BUN 41 (*)    Creatinine, Ser 5.49 (*)    AST 42 (*)    Alkaline Phosphatase 140 (*)    GFR, Estimated 10 (*)    Anion gap 19 (*)    All other components within normal limits  CBC WITH DIFFERENTIAL/PLATELET - Abnormal; Notable for the following components:   RBC 2.98 (*)    Hemoglobin 11.1 (*)    HCT 34.4 (*)    MCV 115.4 (*)    MCH 37.2 (*)    RDW 18.5 (*)    nRBC 4.5 (*)    Lymphs Abs 0.5 (*)    Abs Immature Granulocytes 0.15 (*)    All other components within normal limits  CULTURE, BLOOD (ROUTINE X 2)  CULTURE, BLOOD (ROUTINE X 2)  LACTIC ACID, PLASMA  LACTIC ACID, PLASMA  URINALYSIS, W/ REFLEX TO CULTURE (INFECTION SUSPECTED)  PROTIME-INR    EKG: None  Radiology: CT ABDOMEN PELVIS WO CONTRAST Result Date: 04/11/2024 CLINICAL DATA:  Recent fall and  findings suspicious for sepsis EXAM: CT ABDOMEN AND PELVIS WITHOUT CONTRAST TECHNIQUE: Multidetector CT imaging of the abdomen and pelvis was performed following the standard protocol without IV contrast. RADIATION DOSE REDUCTION: This exam was performed according to the departmental dose-optimization program which includes automated exposure control, adjustment of the mA and/or kV according to patient size and/or use of iterative reconstruction technique. COMPARISON:  04/04/2024 FINDINGS: Lower chest: Large right-sided pleural effusion is again noted. Vascular congestion is again seen. Hepatobiliary: Cholelithiasis without complicating factors. The liver is within normal limits. Pancreas: Unremarkable. No pancreatic ductal dilatation or surrounding inflammatory changes. Spleen: Normal in size without focal abnormality. Adrenals/Urinary Tract: Adrenal glands are within normal limits. Kidneys are atrophic consistent with the known end-stage renal disease. Scattered cysts are noted bilaterally. No follow-up is recommended. Nonobstructing stone is seen in the lower pole of the right kidney. Left-sided nephrostomy catheter is noted and stable. No ureteral stones are seen. The bladder is decompressed. Stomach/Bowel: There are changes consistent with right-sided ileostomy and prior total colectomy. These are stable in appearance. No small bowel obstructive changes are seen. The stomach is within normal limits. Drainage catheter is seen in the presacral region. Vascular/Lymphatic: Aortic atherosclerosis. No enlarged abdominal or pelvic lymph nodes. Left AV graft is noted. Reproductive: Prostate is unremarkable.  Penile prosthesis is noted. Other: No abdominal wall hernia or abnormality. No abdominopelvic ascites. Musculoskeletal: L5 pars defects are noted with grade 1 anterolisthesis. IMPRESSION: Stable right-sided pleural effusion. Cholelithiasis without complicating factors. Stable right renal calculi.  Stable renal  atrophy. Electronically Signed   By: Oneil Devonshire M.D.   On: 04/11/2024 23:08   DG Chest Port 1 View Result Date: 04/11/2024 CLINICAL DATA:  Possible sepsis EXAM: PORTABLE CHEST 1 VIEW COMPARISON:  04/07/2024 FINDINGS: Cardiac shadow is prominent but accentuated by the portable technique. Thoracic aorta  is tortuous. Right subclavian vein stent is noted. Mild central vascular congestion is noted with interstitial edema slightly improved when compared with the prior exam. Small right-sided pleural effusion is noted. Basilar atelectasis is seen as well on the right. IMPRESSION: Mild congestive failure. Right basilar atelectasis and associated effusion. Electronically Signed   By: Oneil Devonshire M.D.   On: 04/11/2024 21:35    {Document cardiac monitor, telemetry assessment procedure when appropriate:32947} Procedures   Medications Ordered in the ED  sodium chloride  0.9 % bolus 250 mL (has no administration in time range)  hydrocortisone  sodium succinate  (SOLU-CORTEF ) 100 MG injection 100 mg (has no administration in time range)  sodium chloride  0.9 % bolus 250 mL (0 mLs Intravenous Stopped 04/11/24 2313)      {Click here for ABCD2, HEART and other calculators REFRESH Note before signing:1}                              Medical Decision Making Amount and/or Complexity of Data Reviewed Labs: ordered. Radiology: ordered.  Risk Prescription drug management.   This patient presents to the ED for concern of leg swelling, this involves an extensive number of treatment options, and is a complaint that carries with it a high risk of complications and morbidity.  I considered the following differential and admission for this acute, potentially life threatening condition.  The differential diagnosis includes cellulitis, CHF, peripheral edema, IVC occlusion  MDM:    Patient is a 88 year old male who has a complex medical history.  He is dialysis patient.  He is being treated with antibiotics for a recent  sacral abscess.  He does not have any increase in abdominal pain.  No fevers.  He has had increasing lower extremity edema and redness.  Family is concerned about recurrent cellulitis.  His labs show normal white count.  His lactic acid is normal.  Blood cultures were obtained.  His BP is mildly low in the 80s and 90s but that does not seem atypical for him.  He is on midodrine .  Apparently he did not take it today.  He did have dialysis today.  He was given small fluid boluses and his blood pressure responded into the upper 90s.  Ultrasounds of his legs have been ordered but are pending.  CT abdomen pelvis do not show any worsening fluid collections.  Patient will need admission.  Discussed with the pharmacist.  Ertapenem  will cover most cellulitis causing organisms other than certain resistant staph/MRSA.  Will cover with a dose of vancomycin .  Patient given a dose of hydrocortisone  given his adrenal insufficiency.  (Labs, imaging, consults)  Labs: I Ordered, and personally interpreted labs.  The pertinent results include: Normal WBC count, normal lactic acid  Imaging Studies ordered: I ordered imaging studies including CT abdomen pelvis, lower extremity ultrasounds I independently visualized and interpreted imaging. I agree with the radiologist interpretation  Additional history obtained from daughter.  External records from outside source obtained and reviewed including history  Cardiac Monitoring: The patient was maintained on a cardiac monitor.  If on the cardiac monitor, I personally viewed and interpreted the cardiac monitored which showed an underlying rhythm of: Sinus tachycardia  Reevaluation: After the interventions noted above, I reevaluated the patient and found that they have :stayed the same  Social Determinants of Health:    Disposition: Admit to hospital  Co morbidities that complicate the patient evaluation  Past Medical History:  Diagnosis Date  A-fib (HCC)     Anemia    Arthritis    Cancer (HCC)    Basal cell   COVID-19    2021   Dysrhythmia    Afib-controlled on eliquis    ESRD (end stage renal disease) (HCC) 10/22/2021   Glaucoma 11/18/2021   History of DVT (deep vein thrombosis)    Hydronephrosis    managed wtih a PCN   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     Medicines Meds ordered this encounter  Medications   sodium chloride  0.9 % bolus 250 mL   sodium chloride  0.9 % bolus 250 mL   hydrocortisone  sodium succinate  (SOLU-CORTEF ) 100 MG injection 100 mg    I have reviewed the patients home medicines and have made adjustments as needed  Problem List / ED Course: Problem List Items Addressed This Visit   None         {Document critical care time when appropriate  Document review of labs and clinical decision tools ie CHADS2VASC2, etc  Document your independent review of radiology images and any outside records  Document your discussion with family members, caretakers and with consultants  Document social determinants of health affecting pt's care  Document your decision making why or why not admission, treatments were needed:32947:::1}   Final diagnoses:  None    ED Discharge Orders     None

## 2024-04-11 NOTE — ED Notes (Signed)
 Unsuccessful attempt at second set of blood cultures x2.

## 2024-04-11 NOTE — ED Triage Notes (Signed)
 Pt ambulatory with walker to triage  Pt here for bilateral leg swelling, pain and redness  Also states fall today   On dialysis and taking IV antibiotic for pelvic infection at this time  Seeing infectious disease

## 2024-04-12 ENCOUNTER — Encounter: Payer: Self-pay | Admitting: Internal Medicine

## 2024-04-12 ENCOUNTER — Emergency Department (HOSPITAL_BASED_OUTPATIENT_CLINIC_OR_DEPARTMENT_OTHER)

## 2024-04-12 ENCOUNTER — Encounter (HOSPITAL_COMMUNITY): Payer: Self-pay | Admitting: Family Medicine

## 2024-04-12 ENCOUNTER — Ambulatory Visit: Payer: Self-pay | Admitting: Family Medicine

## 2024-04-12 ENCOUNTER — Ambulatory Visit: Admitting: Physician Assistant

## 2024-04-12 ENCOUNTER — Other Ambulatory Visit: Admitting: *Deleted

## 2024-04-12 ENCOUNTER — Encounter: Payer: Self-pay | Admitting: Family Medicine

## 2024-04-12 DIAGNOSIS — I951 Orthostatic hypotension: Secondary | ICD-10-CM

## 2024-04-12 DIAGNOSIS — H409 Unspecified glaucoma: Secondary | ICD-10-CM | POA: Diagnosis present

## 2024-04-12 DIAGNOSIS — E8770 Fluid overload, unspecified: Secondary | ICD-10-CM | POA: Diagnosis present

## 2024-04-12 DIAGNOSIS — B9620 Unspecified Escherichia coli [E. coli] as the cause of diseases classified elsewhere: Secondary | ICD-10-CM | POA: Diagnosis present

## 2024-04-12 DIAGNOSIS — A419 Sepsis, unspecified organism: Secondary | ICD-10-CM | POA: Diagnosis present

## 2024-04-12 DIAGNOSIS — Z87891 Personal history of nicotine dependence: Secondary | ICD-10-CM | POA: Diagnosis not present

## 2024-04-12 DIAGNOSIS — Z933 Colostomy status: Secondary | ICD-10-CM | POA: Diagnosis not present

## 2024-04-12 DIAGNOSIS — Z8616 Personal history of COVID-19: Secondary | ICD-10-CM | POA: Diagnosis not present

## 2024-04-12 DIAGNOSIS — G609 Hereditary and idiopathic neuropathy, unspecified: Secondary | ICD-10-CM | POA: Diagnosis present

## 2024-04-12 DIAGNOSIS — R600 Localized edema: Secondary | ICD-10-CM | POA: Diagnosis present

## 2024-04-12 DIAGNOSIS — Z66 Do not resuscitate: Secondary | ICD-10-CM | POA: Diagnosis present

## 2024-04-12 DIAGNOSIS — I4821 Permanent atrial fibrillation: Secondary | ICD-10-CM | POA: Diagnosis present

## 2024-04-12 DIAGNOSIS — Z1624 Resistance to multiple antibiotics: Secondary | ICD-10-CM | POA: Diagnosis present

## 2024-04-12 DIAGNOSIS — K51919 Ulcerative colitis, unspecified with unspecified complications: Secondary | ICD-10-CM

## 2024-04-12 DIAGNOSIS — K5900 Constipation, unspecified: Secondary | ICD-10-CM | POA: Diagnosis present

## 2024-04-12 DIAGNOSIS — J90 Pleural effusion, not elsewhere classified: Secondary | ICD-10-CM | POA: Diagnosis present

## 2024-04-12 DIAGNOSIS — Z936 Other artificial openings of urinary tract status: Secondary | ICD-10-CM | POA: Diagnosis not present

## 2024-04-12 DIAGNOSIS — L02818 Cutaneous abscess of other sites: Secondary | ICD-10-CM | POA: Diagnosis present

## 2024-04-12 DIAGNOSIS — Z1612 Extended spectrum beta lactamase (ESBL) resistance: Secondary | ICD-10-CM

## 2024-04-12 DIAGNOSIS — N139 Obstructive and reflux uropathy, unspecified: Secondary | ICD-10-CM | POA: Diagnosis present

## 2024-04-12 DIAGNOSIS — D631 Anemia in chronic kidney disease: Secondary | ICD-10-CM | POA: Diagnosis present

## 2024-04-12 DIAGNOSIS — N186 End stage renal disease: Secondary | ICD-10-CM

## 2024-04-12 DIAGNOSIS — E2740 Unspecified adrenocortical insufficiency: Secondary | ICD-10-CM | POA: Diagnosis present

## 2024-04-12 DIAGNOSIS — Z992 Dependence on renal dialysis: Secondary | ICD-10-CM

## 2024-04-12 DIAGNOSIS — I480 Paroxysmal atrial fibrillation: Secondary | ICD-10-CM

## 2024-04-12 DIAGNOSIS — M4628 Osteomyelitis of vertebra, sacral and sacrococcygeal region: Secondary | ICD-10-CM | POA: Diagnosis present

## 2024-04-12 DIAGNOSIS — A498 Other bacterial infections of unspecified site: Secondary | ICD-10-CM

## 2024-04-12 DIAGNOSIS — Z932 Ileostomy status: Secondary | ICD-10-CM | POA: Diagnosis not present

## 2024-04-12 DIAGNOSIS — N2581 Secondary hyperparathyroidism of renal origin: Secondary | ICD-10-CM | POA: Diagnosis present

## 2024-04-12 DIAGNOSIS — R64 Cachexia: Secondary | ICD-10-CM | POA: Diagnosis present

## 2024-04-12 DIAGNOSIS — J4540 Moderate persistent asthma, uncomplicated: Secondary | ICD-10-CM

## 2024-04-12 LAB — BASIC METABOLIC PANEL WITH GFR
Anion gap: 15 (ref 5–15)
BUN: 48 mg/dL — ABNORMAL HIGH (ref 8–23)
CO2: 23 mmol/L (ref 22–32)
Calcium: 9.3 mg/dL (ref 8.9–10.3)
Chloride: 101 mmol/L (ref 98–111)
Creatinine, Ser: 6.09 mg/dL — ABNORMAL HIGH (ref 0.61–1.24)
GFR, Estimated: 8 mL/min — ABNORMAL LOW (ref >60)
Glucose, Bld: 166 mg/dL — ABNORMAL HIGH (ref 70–99)
Potassium: 5.1 mmol/L (ref 3.5–5.1)
Sodium: 139 mmol/L (ref 135–145)

## 2024-04-12 LAB — C-REACTIVE PROTEIN: CRP: 1.8 mg/dL — ABNORMAL HIGH (ref <1.0)

## 2024-04-12 LAB — SEDIMENTATION RATE: Sed Rate: 16 mm/h (ref 0–16)

## 2024-04-12 MED ORDER — MIDODRINE HCL 5 MG PO TABS: 30.0000 mg | ORAL_TABLET | Freq: Three times a day (TID) | ORAL | Status: DC

## 2024-04-12 MED ORDER — ACETAMINOPHEN 650 MG RE SUPP: 650.0000 mg | Freq: Four times a day (QID) | RECTAL | Status: DC | PRN

## 2024-04-12 MED ORDER — RAMELTEON 8 MG PO TABS
8.0000 mg | ORAL_TABLET | Freq: Every day | ORAL | Status: DC
Start: 2024-04-12 — End: 2024-04-16
  Administered 2024-04-12 – 2024-04-14 (×3): 8 mg via ORAL
  Filled 2024-04-12 (×4): qty 1

## 2024-04-12 MED ORDER — PROCHLORPERAZINE EDISYLATE 10 MG/2ML IJ SOLN: 5.0000 mg | Freq: Four times a day (QID) | INTRAMUSCULAR | Status: DC | PRN

## 2024-04-12 MED ORDER — MIDODRINE HCL 5 MG PO TABS: 10.0000 mg | ORAL_TABLET | Freq: Three times a day (TID) | ORAL | Status: DC

## 2024-04-12 MED ORDER — SODIUM CHLORIDE 0.9 % IV SOLN
1.0000 g | INTRAVENOUS | Status: DC
  Administered 2024-04-14 – 2024-04-15 (×2): 1 g via INTRAVENOUS
  Filled 2024-04-12 (×2): qty 1000

## 2024-04-12 MED ORDER — SENNA 8.6 MG PO TABS: 1.0000 | ORAL_TABLET | Freq: Every day | ORAL | Status: DC | PRN

## 2024-04-12 MED ORDER — ATORVASTATIN CALCIUM 10 MG PO TABS
20.0000 mg | ORAL_TABLET | Freq: Every day | ORAL | Status: DC
  Administered 2024-04-13 – 2024-04-15 (×3): 20 mg via ORAL
  Filled 2024-04-12 (×3): qty 2

## 2024-04-12 MED ORDER — TIMOLOL MALEATE 0.5 % OP SOLN
1.0000 [drp] | Freq: Two times a day (BID) | OPHTHALMIC | Status: DC
  Administered 2024-04-12 – 2024-04-15 (×6): 1 [drp] via OPHTHALMIC
  Filled 2024-04-12: qty 5

## 2024-04-12 MED ORDER — APIXABAN 2.5 MG PO TABS
2.5000 mg | ORAL_TABLET | Freq: Two times a day (BID) | ORAL | Status: DC
  Administered 2024-04-12 – 2024-04-15 (×6): 2.5 mg via ORAL
  Filled 2024-04-12 (×6): qty 1

## 2024-04-12 MED ORDER — HYDROCORTISONE VALERATE 0.2 % EX OINT: TOPICAL_OINTMENT | Freq: Two times a day (BID) | CUTANEOUS | Status: DC

## 2024-04-12 MED ORDER — HYDROCORTISONE 10 MG PO TABS
10.0000 mg | ORAL_TABLET | Freq: Every evening | ORAL | Status: DC
  Administered 2024-04-12 – 2024-04-14 (×3): 10 mg via ORAL
  Filled 2024-04-12 (×4): qty 1

## 2024-04-12 MED ORDER — HYDROCORTISONE 0.5 % EX CREA
TOPICAL_CREAM | Freq: Two times a day (BID) | CUTANEOUS | Status: DC
  Filled 2024-04-12: qty 28.35

## 2024-04-12 MED ORDER — SODIUM CHLORIDE 0.9% FLUSH
3.0000 mL | Freq: Two times a day (BID) | INTRAVENOUS | Status: DC
  Administered 2024-04-12 – 2024-04-15 (×6): 3 mL via INTRAVENOUS

## 2024-04-12 MED ORDER — ACETAMINOPHEN 325 MG PO TABS
650.0000 mg | ORAL_TABLET | Freq: Four times a day (QID) | ORAL | Status: DC | PRN
  Administered 2024-04-12 – 2024-04-14 (×4): 650 mg via ORAL
  Filled 2024-04-12 (×4): qty 2

## 2024-04-12 MED ORDER — HYDROCORTISONE 20 MG PO TABS
20.0000 mg | ORAL_TABLET | Freq: Every day | ORAL | Status: DC
  Administered 2024-04-13 – 2024-04-14 (×2): 20 mg via ORAL
  Filled 2024-04-12 (×3): qty 1

## 2024-04-12 MED ORDER — GABAPENTIN 300 MG PO CAPS
300.0000 mg | ORAL_CAPSULE | Freq: Every day | ORAL | Status: DC
  Administered 2024-04-12 – 2024-04-14 (×3): 300 mg via ORAL
  Filled 2024-04-12 (×3): qty 1

## 2024-04-12 MED ORDER — FLUTICASONE FUROATE-VILANTEROL 100-25 MCG/ACT IN AEPB
1.0000 | INHALATION_SPRAY | Freq: Every day | RESPIRATORY_TRACT | Status: DC
  Administered 2024-04-13 – 2024-04-14 (×2): 1 via RESPIRATORY_TRACT
  Filled 2024-04-12: qty 28

## 2024-04-12 MED ORDER — MIDODRINE HCL 5 MG PO TABS
30.0000 mg | ORAL_TABLET | Freq: Three times a day (TID) | ORAL | Status: DC
  Administered 2024-04-12 – 2024-04-15 (×8): 30 mg via ORAL
  Filled 2024-04-12 (×8): qty 6

## 2024-04-12 MED ORDER — MIDODRINE HCL 5 MG PO TABS
10.0000 mg | ORAL_TABLET | Freq: Three times a day (TID) | ORAL | Status: DC
  Administered 2024-04-12 (×4): 10 mg via ORAL
  Filled 2024-04-12 (×4): qty 2

## 2024-04-12 MED ORDER — TRAMADOL HCL 50 MG PO TABS
50.0000 mg | ORAL_TABLET | Freq: Two times a day (BID) | ORAL | Status: DC | PRN
  Administered 2024-04-14 – 2024-04-15 (×2): 50 mg via ORAL
  Filled 2024-04-12 (×2): qty 1

## 2024-04-12 NOTE — ED Notes (Signed)
 MD notified on continued hypotension - home midodrine  ordered and given.

## 2024-04-12 NOTE — ED Notes (Signed)
 Hypotension noted when patient is sleeping - MAP remains above 65. Blood pressure rises to 90 systolic when patient is awake. MD aware.

## 2024-04-12 NOTE — ED Notes (Signed)
 Family updated as to patient's status.

## 2024-04-12 NOTE — ED Notes (Signed)
 Attempted to call Control and instrumentation engineer, was transferred, no answer.

## 2024-04-12 NOTE — ED Notes (Signed)
Patient placed on 2 LNC while sleeping 

## 2024-04-12 NOTE — ED Notes (Signed)
 Attempted to call report to 5W, no answer at this time.

## 2024-04-12 NOTE — Progress Notes (Deleted)
 Cardiology Office Note:    Date:  04/12/2024   ID:  Joshua Riley, DOB [birth year], MRN [id number]  PCP:  Katrinka Garnette KIDD, MD   [facility] Providers Cardiologist:  Lynwood Schilling, MD { Click to update primary MD,subspecialty MD or APP then REFRESH:1}    Referring MD: Katrinka Garnette KIDD, MD   No chief complaint on file. ***  History of Present Illness:    Joshua Riley is a 88 y.o. male with a hx of persistent atrial fibrillation on chronic anticoagulation, GI bleed, ESRD on HD, DVT, ulcerative colitis s/p total abdominal colectomy and ileostomy with ureteral injuries requiring bilateral percutaneous nephrostomies, multiple prior SBO's, sacral osteomyelitis, anemia, RBBB with first-degree AV block,, orthostatic hypotension presenting to the clinic for follow-up of hospital admission 3 months prior.  Known history of persistent atrial fibrillation for which he has been maintained on Eliquis  for stroke prevention.  Not on nodal agents due to hypotension with HD requiring midodrine .   Per cardiology records he has a history of a stress test 11/2020 which was negative for ischemia.  TTE with normal LVEF with mitral sclerosis.   Seen in cardiology clinic 05/2022 with worsening shortness of breath with minimal exertion.  We scheduled a cath but then he was admitted 11/27-12/08/2021 prior to procedure for fever up to 103 after HD with confusion.  WBC 7.8K, influenza, COVID-19 PCR negative.  Blood culture positive for staph.  Seen by ID who recommended removal of right groin TDC which was removed by vascular surgery after his left groin AV graft was functional.  Repeat blood cultures were negative.  He requested to postpone catheterization until early Riley.  TTE 05/2022 with EF 60 to 65%, normal strain, normal RV, trivial MR, ascending aorta 42 mm.   He underwent LHC on 07/22/2022 which showed no significant CAD and normal LVEDP.  At follow-up visit he reported walking more and able  to walk at 3 miles per hour on treadmill for 15 minutes.  He continued to have drop in BP with HD and remained on midodrine .   He had bright red blood per rectum and Eliquis  was decreased to once daily.  He was [facility] to [facility] for management in 2024.  Patient admitted in 06/2023 for groin abscess and abscess in perisacral space requiring 4 weeks IV meropenem .  During admission he developed SBO versus ileus, surgery was consulted.  Also had atrial fibrillation, was started on amiodarone , amiodarone  was discontinued secondary to bradycardia and sinus pauses.  Discharged to [facility] for wound management and continued antibiotics.  11/08/2023 patient presented to ED with complaints of chest pain of moderate severity located bilaterally across chest. HS troponin 428>>374, WBCs 15.8, Hgb 9.8, initial EKG revealed ST changes in inferior leads consistent with prior EKGs.  Cardiology consulted, diagnosed with pericarditis and placed on ibuprofen .  Past Medical History:  Diagnosis Date   A-fib (HCC)    Anemia    Arthritis    Cancer (HCC)    Basal cell   COVID-19    2021   Dysrhythmia    Afib-controlled on eliquis    ESRD (end stage renal disease) (HCC) 10/22/2021   Glaucoma 11/18/2021   History of DVT (deep vein thrombosis)    Hydronephrosis    managed wtih a PCN   Idiopathic neuropathy 10/22/2021   lyrica     Ileostomy in place [facility])    Obstructive uropathy    With chronic left nephrostomy   Old retinal detachment, total or subtotal  Orthostatic hypotension 10/22/2021   Sleep apnea    does not need a machine   Stroke (HCC)    Ulcerative colitis (HCC)    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    Past Surgical History:  Procedure Laterality Date   BASAL CELL CARCINOMA EXCISION     10/23   COLON SURGERY     creation of j pouch     and subsequent takedown of j pouch   EYE SURGERY     IR CATHETER TUBE CHANGE  12/18/2023   IR CATHETER TUBE CHANGE  02/19/2024    IR CATHETER TUBE CHANGE  02/19/2024   IR NEPHROSTOMY EXCHANGE LEFT  12/10/2021   IR NEPHROSTOMY EXCHANGE LEFT  04/22/2022   IR NEPHROSTOMY EXCHANGE LEFT  07/29/2022   IR NEPHROSTOMY EXCHANGE LEFT  10/28/2022   IR NEPHROSTOMY EXCHANGE LEFT  02/05/2023   IR NEPHROSTOMY EXCHANGE LEFT  05/07/2023   IR NEPHROSTOMY EXCHANGE LEFT  06/25/2023   IR NEPHROSTOMY EXCHANGE LEFT  07/17/2023   IR NEPHROSTOMY EXCHANGE LEFT  10/22/2023   IR RADIOLOGIST EVAL & MGMT  11/25/2023   IR RADIOLOGIST EVAL & MGMT  12/18/2023   IR RADIOLOGIST EVAL & MGMT  02/19/2024   IR RADIOLOGIST EVAL & MGMT  04/04/2024   IR THROMBECTOMY AV FISTULA W/THROMBOLYSIS/PTA INC/SHUNT/IMG LEFT Left 02/23/2024   IR US  GUIDE VASC ACCESS LEFT  02/23/2024   LEFT HEART CATH AND CORONARY ANGIOGRAPHY N/A 07/22/2022   Procedure: LEFT HEART CATH AND CORONARY ANGIOGRAPHY;  Surgeon: Swaz[place], Peter M, MD;  Location: [facility] INVASIVE CV LAB;  Service: Cardiovascular;  Laterality: N/A;   REVISION OF ARTERIOVENOUS GORETEX GRAFT Left 05/06/2022   Procedure: REDO LEFT THIGH ARTERIOVENOUS 4-7 MM GORETEX GRAFT;  Surgeon: Eliza Lonni RAMAN, MD;  Location: [facility] OR;  Service: Vascular;  Laterality: Left;   SMALL INTESTINE SURGERY     TOTAL COLECTOMY      Current Medications: No outpatient medications have been marked as taking for the 04/12/24 encounter (Appointment) with Janene Boer, PA.     Allergies:   Baclofen, Cephalosporins, Ciprofloxacin, and Wound dressing adhesive   Social History   Socioeconomic History   Marital status: Widowed    Spouse name: Not on file   Number of children: Not on file   Years of education: Not on file   Highest education level: 12th grade  Occupational History   Not on file  Tobacco Use   Smoking status: Former    Current packs/day: 0.00    Average packs/day: 2.0 packs/day for 6.0 years (12.0 ttl pk-yrs)    Types: Cigarettes    Start date: 50    Quit date: 96    Years since quitting: 40.7    Passive exposure: Never    Smokeless tobacco: Never  Vaping Use   Vaping status: Never Used  Substance and Sexual Activity   Alcohol use: Yes    Alcohol/week: 5.0 standard drinks of alcohol    Types: 5 Shots of liquor per week    Comment: socially   Drug use: Never   Sexual activity: Not Currently  Other Topics Concern   Not on file  Social History Narrative   Widowed- lost wife of 13 years to pancreatic cancer- previously married 43 years. Son in [place] and daughter helping in [place]. 4 grandkids.       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       Hobbies: dinner with family- occasional martini  Social Drivers of Corporate investment banker Strain: Low Risk  (10/26/2023)   Overall Financial Resource Strain (CARDIA)    Difficulty of Paying Living Expenses: Not hard at all  Food Insecurity: No Food Insecurity (03/22/2024)   Hunger Vital Sign    Worried About Running Out of Food in the Last Year: Never true    Ran Out of Food in the Last Year: Never true  Transportation Needs: No Transportation Needs (03/22/2024)   PRAPARE - Administrator, Civil Service (Medical): No    Lack of Transportation (Non-Medical): No  Physical Activity: Insufficiently Active (10/26/2023)   Exercise Vital Sign    Days of Exercise per Week: 1 day    Minutes of Exercise per Session: 10 min  Stress: Stress Concern Present (10/26/2023)   [employer] of Occupational Health - Occupational Stress Questionnaire    Feeling of Stress : To some extent  Social Connections: Patient Declined (03/17/2024)   Social Connection and Isolation Panel    Frequency of Communication with Friends and Family: Patient declined    Frequency of Social Gatherings with Friends and Family: Patient declined    Attends Religious Services: Patient declined    Database administrator or Organizations: Patient declined    Attends Banker Meetings: Patient declined    Marital Status: Patient declined  Recent Concern:  Social Connections - Socially Isolated (01/27/2024)   Social Connection and Isolation Panel    Frequency of Communication with Friends and Family: More than three times a week    Frequency of Social Gatherings with Friends and Family: More than three times a week    Attends Religious Services: Never    Database administrator or Organizations: No    Attends Banker Meetings: Never    Marital Status: Widowed     Family History: The patient's ***family history includes Cancer in his father; Esophageal cancer in his brother; Stroke in his mother.  ROS:   Please see the history of present illness.    *** All other systems reviewed and are negative.  EKGs/Labs/Other Studies Reviewed:    The following studies were reviewed today: ***      Recent Labs: 12/01/2023: TSH 4.948 01/31/2024: Magnesium  1.8 04/11/2024: ALT 23; BUN 41; Creatinine, Ser 5.49; Hemoglobin 11.1; Platelets 191; Potassium 5.4; Sodium 141  Recent Lipid Panel    Component Value Date/Time   CHOL 76 11/10/2023 0347   TRIG 180 (H) 11/10/2023 0347   HDL 25 (L) 11/10/2023 0347   CHOLHDL 3.0 11/10/2023 0347   VLDL 36 11/10/2023 0347   LDLCALC 15 11/10/2023 0347     Risk Assessment/Calculations:   {Does this patient have ATRIAL FIBRILLATION?:[phone]}            Physical Exam:    VS:  There were no vitals taken for this visit.    Wt Readings from Last 3 Encounters:  04/11/24 157 lb 10.1 oz (71.5 kg)  04/07/24 157 lb 9.6 oz (71.5 kg)  03/18/24 164 lb 3.9 oz (74.5 kg)     GEN: *** Well nourished, well developed in no acute distress HEENT: Normal NECK: No JVD; No carotid bruits LYMPHATICS: No lymphadenopathy CARDIAC: ***RRR, no murmurs, rubs, gallops RESPIRATORY:  Clear to auscultation without rales, wheezing or rhonchi  ABDOMEN: Soft, non-tender, non-distended MUSCULOSKELETAL:  No edema; No deformity  SKIN: Warm and dry NEUROLOGIC:  Alert and oriented x 3 PSYCHIATRIC:  Normal affect    ASSESSMENT:    No  diagnosis found. PLAN:    In order of problems listed above:  ***      {Are you ordering a CV Procedure (e.g. stress test, cath, DCCV, TEE, etc)?   Press F2        :[id number]}    Medication Adjustments/Labs and Tests Ordered: Current medicines are reviewed at length with the patient today.  Concerns regarding medicines are outlined above.  No orders of the defined types were placed in this encounter.  No orders of the defined types were placed in this encounter.   There are no Patient Instructions on file for this visit.   Signed, Miriam FORBES Shams, NP  04/12/2024 4:53 AM    [facility]

## 2024-04-12 NOTE — ED Provider Notes (Signed)
 Care assumed at shift change pending call from Hospitalist. Dr. Franky to admit   Roselyn Carlin NOVAK, MD 04/12/24 [phone]

## 2024-04-12 NOTE — ED Notes (Signed)
 Thomas with cl called for transport

## 2024-04-12 NOTE — H&P (Signed)
 History and Physical    Joshua Riley FMW:[id number] DOB: May 14, 1936 DOA: 04/11/2024  PCP: Katrinka Garnette KIDD, MD   Patient coming from: Home   Chief Complaint: leg redness, swelling, weeping, and pain  HPI: Joshua Riley is a 88 y.o. male with medical history significant for ulcerative colitis status post colectomy, atrial fibrillation on Eliquis , asthma, adrenal insufficiency, orthostatic hypotension, ESRD on hemodialysis, obstructive uropathy with left nephrostomy tube, and recent presacral abscess currently on ertapenem  with dialysis who presents for evaluation of bilateral lower extremity redness, swelling, weeping, and pain.  Patient has been on ertapenem  with dialysis for a presacral abscess that grew multiple organisms including ESBL producing E. coli.  He has developed worsening erythema, edema, pain, and serous weeping from his lower legs bilaterally.  He denies any fevers, chills, chest pain, or shortness of breath.  [facility] ED Course: Upon arrival to the ED, patient is found to be afebrile and saturating well on room air initially with normal HR and SBP as low as 78.  Chest x-ray features mild CHF findings and CT of the abdomen and pelvis is notable for a stable large right pleural effusion, left nephrostomy tube, and no acute intra-abdominal or pelvic abnormality.  Labs are most notable for potassium 5.4, bicarbonate 21, BUN 41, normal WBC, and normal lactic acid.  Blood cultures collected in the ED and the patient was treated with vancomycin , 100 mg IV hydrocortisone , 250 mL NS, and midodrine .  He was transferred to [facility] for admission.  Review of Systems:  All other systems reviewed and apart from HPI, are negative.  Past Medical History:  Diagnosis Date   A-fib (HCC)    Anemia    Arthritis    Cancer (HCC)    Basal cell   COVID-19    2021   Dysrhythmia    Afib-controlled on eliquis    ESRD (end stage renal disease) (HCC) 10/22/2021    Glaucoma 11/18/2021   History of DVT (deep vein thrombosis)    Hydronephrosis    managed wtih a PCN   Idiopathic neuropathy 10/22/2021   lyrica     Ileostomy in place [facility])    Obstructive uropathy    With chronic left nephrostomy   Old retinal detachment, total or subtotal    Orthostatic hypotension 10/22/2021   Sleep apnea    does not need a machine   Stroke (HCC)    Ulcerative colitis (HCC)    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    Past Surgical History:  Procedure Laterality Date   BASAL CELL CARCINOMA EXCISION     10/23   COLON SURGERY     creation of j pouch     and subsequent takedown of j pouch   EYE SURGERY     IR CATHETER TUBE CHANGE  12/18/2023   IR CATHETER TUBE CHANGE  02/19/2024   IR CATHETER TUBE CHANGE  02/19/2024   IR NEPHROSTOMY EXCHANGE LEFT  12/10/2021   IR NEPHROSTOMY EXCHANGE LEFT  04/22/2022   IR NEPHROSTOMY EXCHANGE LEFT  07/29/2022   IR NEPHROSTOMY EXCHANGE LEFT  10/28/2022   IR NEPHROSTOMY EXCHANGE LEFT  02/05/2023   IR NEPHROSTOMY EXCHANGE LEFT  05/07/2023   IR NEPHROSTOMY EXCHANGE LEFT  06/25/2023   IR NEPHROSTOMY EXCHANGE LEFT  07/17/2023   IR NEPHROSTOMY EXCHANGE LEFT  10/22/2023   IR RADIOLOGIST EVAL & MGMT  11/25/2023   IR RADIOLOGIST EVAL & MGMT  12/18/2023   IR RADIOLOGIST EVAL & MGMT  02/19/2024  IR RADIOLOGIST EVAL & MGMT  04/04/2024   IR THROMBECTOMY AV FISTULA W/THROMBOLYSIS/PTA INC/SHUNT/IMG LEFT Left 02/23/2024   IR US  GUIDE VASC ACCESS LEFT  02/23/2024   LEFT HEART CATH AND CORONARY ANGIOGRAPHY N/A 07/22/2022   Procedure: LEFT HEART CATH AND CORONARY ANGIOGRAPHY;  Surgeon: Swaziland, Peter M, MD;  Location: [facility] INVASIVE CV LAB;  Service: Cardiovascular;  Laterality: N/A;   REVISION OF ARTERIOVENOUS GORETEX GRAFT Left 05/06/2022   Procedure: REDO LEFT THIGH ARTERIOVENOUS 4-7 MM GORETEX GRAFT;  Surgeon: Eliza Lonni RAMAN, MD;  Location: [facility] OR;  Service: Vascular;  Laterality: Left;   SMALL INTESTINE SURGERY     TOTAL  COLECTOMY      Social History:   reports that he quit smoking about 40 years ago. His smoking use included cigarettes. He started smoking about 46 years ago. He has a 12 pack-year smoking history. He has never been exposed to tobacco smoke. He has never used smokeless tobacco. He reports current alcohol use of about 5.0 standard drinks of alcohol per week. He reports that he does not use drugs.  Allergies  Allergen Reactions   Baclofen Other (See Comments)    Altered mental status, after accidental overdose     Cephalosporins Rash   Ciprofloxacin Itching and Rash   Wound Dressing Adhesive Rash    Family History  Problem Relation Age of Onset   Stroke Mother    Cancer Father    Esophageal cancer Brother      Prior to Admission medications   Medication Sig Start Date End Date Taking? Authorizing Provider  atorvastatin  (LIPITOR) 20 MG tablet Take 1 tablet (20 mg total) by mouth daily. 11/14/23   Sebastian Toribio GAILS, MD  calcitRIOL  (ROCALTROL ) 0.5 MCG capsule Take 1 capsule (0.5 mcg total) by mouth every Monday, Wednesday, and Friday with hemodialysis. 12/04/23   Danford, Lonni SQUIBB, MD  Cyanocobalamin  (VITAMIN B-12 PO) Take 1 capsule by mouth in the morning and at bedtime.    [provider]  dorzolamide  (TRUSOPT ) 2 % ophthalmic solution Place 1 drop into the right eye at bedtime. 06/17/23   [provider]  Doxylamine Succinate, Sleep, (SLEEP AID PO) Take 1 Dose by mouth at bedtime. Sleep aid of unknown name. OTC.    [provider]  ELIQUIS  2.5 MG TABS tablet Take 1 tablet (2.5 mg total) by mouth 2 (two) times daily. 02/17/24   Katrinka Garnette KIDD, MD  fludrocortisone  (FLORINEF ) 0.1 MG tablet Take 1 tablet (0.1 mg total) by mouth daily. 12/04/23   Danford, Lonni SQUIBB, MD  folic acid  (FOLVITE ) 1 MG tablet Take 1 tablet (1 mg total) by mouth daily. 07/21/23   Regalado, Belkys A, MD  gabapentin  (NEURONTIN ) 300 MG capsule TAKE 1 CAPSULE BY MOUTH ONCE A DAY AT  BEDTIME FOR NEUROPATHY 03/21/24   Katrinka Garnette KIDD, MD  hydrocortisone  (CORTEF ) 10 MG tablet Take 2 tablets (20 mg total) by mouth daily and take 1 tablet (10 mg total) by mouth every evening. 03/18/24   Dennise Lavada POUR, MD  hydrocortisone  (CORTEF ) 20 MG tablet Take 1 tablet (20 mg total) by mouth daily. 03/18/24   Singh, Prashant K, MD  midodrine  (PROAMATINE ) 10 MG tablet Take 3 tablets (30 mg total) by mouth 3 (three) times daily. 02/03/24   Hunsucker, Donnice SAUNDERS, MD  mometasone -formoterol  (DULERA ) 200-5 MCG/ACT AERO Inhale 2 puffs into the lungs 2 (two) times daily. 04/07/24   Katrinka Garnette KIDD, MD  multivitamin (RENA-VIT) TABS tablet Take 1 tablet by mouth daily.  [provider]  Omega Fatty Acids-Vitamins (OMEGA-3 GUMMIES) CHEW Chew 1,200 mg by mouth in the morning and at bedtime.    [provider]  pantoprazole  (PROTONIX ) 40 MG tablet Take 1 tablet (40 mg total) by mouth 2 (two) times daily. 11/13/23   Sebastian Toribio GAILS, MD  ramelteon  (ROZEREM ) 8 MG tablet Take 1 tablet (8 mg total) by mouth at bedtime. 10/27/23   Katrinka Garnette KIDD, MD  Sodium Chloride  Flush (SALINE FLUSH) 0.9 % SOLN Use 5 mLs by Intracatheter route daily as directed. 11/25/23   Caperilla, Marissa N, PA  timolol  (TIMOPTIC ) 0.5 % ophthalmic solution Place 1 drop into both eyes in the morning and at bedtime.    [provider]  traMADol  (ULTRAM ) 50 MG tablet Take 1 tablet (50 mg total) by mouth every 12 (twelve) hours as needed (for pain). 04/07/24   Katrinka Garnette KIDD, MD  TYLENOL  325 MG tablet Take 325-650 mg by mouth every 6 (six) hours as needed for mild pain (pain score 1-3) or headache.    [provider]    Physical Exam: Vitals:   04/12/24 1800 04/12/24 1815 04/12/24 1830 04/12/24 2000  BP: 90/62 (!) 86/57 92/75 (!) 88/59  Pulse:  60 (!) 53 100  Resp: 20 20 (!) 21 20  Temp:      TempSrc:      SpO2: 94% 92% (!) 89% 100%  Weight:    76.5 kg  Height:    5' 8 (1.727 m)     Constitutional: NAD, no pallor or diaphoresis   Eyes: PERTLA, lids and conjunctivae normal ENMT: Mucous membranes are moist. Posterior pharynx clear of any exudate or lesions.   Neck: supple, no masses  Respiratory: Diminished on right. No accessory muscle use.  Cardiovascular: S1 & S2 heard, regular rate and rhythm. Bilateral lower leg edema.  Abdomen: No tenderness, soft. Bowel sounds active.  Musculoskeletal: no clubbing / cyanosis. No joint deformity upper and lower extremities.   Skin: Erythema, edema, induration, and serous weeping involving lower legs bilaterally. Skin is otherwise warm, dry, well-perfused. Neurologic: CN 2-12 grossly intact. Moving all extremities. Alert and oriented to person, place, and situation.  Psychiatric: Calm. Cooperative.    Labs and Imaging on Admission: I have personally reviewed following labs and imaging studies  CBC: Recent Labs  Lab 04/11/24 2110  WBC 6.4  NEUTROABS 4.9  HGB 11.1*  HCT 34.4*  MCV 115.4*  PLT 191   Basic Metabolic Panel: Recent Labs  Lab 04/11/24 2110 04/12/24 1337  NA 141 139  K 5.4* 5.1  CL 102 101  CO2 21* 23  GLUCOSE 115* 166*  BUN 41* 48*  CREATININE 5.49* 6.09*  CALCIUM  10.0 9.3   GFR: Estimated Creatinine Clearance: 8.6 mL/min (A) (by C-G formula based on SCr of 6.09 mg/dL (H)). Liver Function Tests: Recent Labs  Lab 04/11/24 2110  AST 42*  ALT 23  ALKPHOS 140*  BILITOT 0.5  PROT 6.7  ALBUMIN  4.2   No results for input(s): LIPASE, AMYLASE in the last 168 hours. No results for input(s): AMMONIA in the last 168 hours. Coagulation Profile: No results for input(s): INR, PROTIME in the last 168 hours. Cardiac Enzymes: No results for input(s): CKTOTAL, CKMB, CKMBINDEX, TROPONINI in the last 168 hours. BNP (last 3 results) No results for input(s): PROBNP in the last 8760 hours. HbA1C: No results for input(s): HGBA1C in the last 72 hours. CBG: No results for input(s):  GLUCAP in the last 168 hours. Lipid  Profile: No results for input(s): CHOL, HDL, LDLCALC, TRIG, CHOLHDL, LDLDIRECT in the last 72 hours. Thyroid Function Tests: No results for input(s): TSH, T4TOTAL, FREET4, T3FREE, THYROIDAB in the last 72 hours. Anemia Panel: No results for input(s): VITAMINB12, FOLATE, FERRITIN, TIBC, IRON, RETICCTPCT in the last 72 hours. Urine analysis:    Component Value Date/Time   COLORURINE STRAW (A) 07/09/2023 1221   APPEARANCEUR TURBID (A) 07/09/2023 1221   LABSPEC  07/09/2023 1221    TEST NOT REPORTED DUE TO COLOR INTERFERENCE OF URINE PIGMENT   PHURINE  07/09/2023 1221    TEST NOT REPORTED DUE TO COLOR INTERFERENCE OF URINE PIGMENT   GLUCOSEU (A) 07/09/2023 1221    TEST NOT REPORTED DUE TO COLOR INTERFERENCE OF URINE PIGMENT   HGBUR (A) 07/09/2023 1221    TEST NOT REPORTED DUE TO COLOR INTERFERENCE OF URINE PIGMENT   BILIRUBINUR (A) 07/09/2023 1221    TEST NOT REPORTED DUE TO COLOR INTERFERENCE OF URINE PIGMENT   KETONESUR (A) 07/09/2023 1221    TEST NOT REPORTED DUE TO COLOR INTERFERENCE OF URINE PIGMENT   PROTEINUR (A) 07/09/2023 1221    TEST NOT REPORTED DUE TO COLOR INTERFERENCE OF URINE PIGMENT   NITRITE (A) 07/09/2023 1221    TEST NOT REPORTED DUE TO COLOR INTERFERENCE OF URINE PIGMENT   LEUKOCYTESUR (A) 07/09/2023 1221    TEST NOT REPORTED DUE TO COLOR INTERFERENCE OF URINE PIGMENT   Sepsis Labs: @LABRCNTIP (procalcitonin:4,lacticidven:4) )No results found for this or any previous visit (from the past 240 hours).   Radiological Exams on Admission: US  Venous Img Lower Bilateral Result Date: 04/12/2024 CLINICAL DATA:  Bilateral leg swelling EXAM: BILATERAL LOWER EXTREMITY VENOUS DOPPLER ULTRASOUND TECHNIQUE: Gray-scale sonography with graded compression, as well as color Doppler and duplex ultrasound were performed to evaluate the lower extremity deep venous systems from the level of the common femoral vein and  including the common femoral, femoral, profunda femoral, popliteal and calf veins including the posterior tibial, peroneal and gastrocnemius veins when visible. The superficial great saphenous vein was also interrogated. Spectral Doppler was utilized to evaluate flow at rest and with distal augmentation maneuvers in the common femoral, femoral and popliteal veins. COMPARISON:  None Available. FINDINGS: RIGHT LOWER EXTREMITY Common Femoral Vein: No evidence of thrombus. Normal compressibility, respiratory phasicity and response to augmentation. Saphenofemoral Junction: No evidence of thrombus. Normal compressibility and flow on color Doppler imaging. Profunda Femoral Vein: No evidence of thrombus. Normal compressibility and flow on color Doppler imaging. Femoral Vein: No evidence of thrombus. Normal compressibility, respiratory phasicity and response to augmentation. Popliteal Vein: No evidence of thrombus. Normal compressibility, respiratory phasicity and response to augmentation. Calf Veins: No evidence of thrombus. Normal compressibility and flow on color Doppler imaging. Superficial Great Saphenous Vein: No evidence of thrombus. Normal compressibility and flow on color Doppler imaging. Venous Reflux:  None. Other Findings:  Mild edema is noted. LEFT LOWER EXTREMITY Common Femoral Vein: No evidence of thrombus. Normal compressibility, respiratory phasicity and response to augmentation. Saphenofemoral Junction: No evidence of thrombus. Normal compressibility and flow on color Doppler imaging. Profunda Femoral Vein: No evidence of thrombus. Normal compressibility and flow on color Doppler imaging. Femoral Vein: No evidence of thrombus. Normal compressibility, respiratory phasicity and response to augmentation. Popliteal Vein: No evidence of thrombus. Normal compressibility, respiratory phasicity and response to augmentation. Calf Veins: No evidence of thrombus. Normal compressibility and flow on color Doppler  imaging. Superficial Great Saphenous Vein: No evidence of thrombus. Normal compressibility and flow on color Doppler  imaging. Venous Reflux:  None. Other Findings:  Mild edema is noted. IMPRESSION: No evidence of deep venous thrombosis. Electronically Signed   By: Oneil Devonshire M.D.   On: 04/12/2024 00:55   CT ABDOMEN PELVIS WO CONTRAST Result Date: 04/11/2024 CLINICAL DATA:  Recent fall and findings suspicious for sepsis EXAM: CT ABDOMEN AND PELVIS WITHOUT CONTRAST TECHNIQUE: Multidetector CT imaging of the abdomen and pelvis was performed following the standard protocol without IV contrast. RADIATION DOSE REDUCTION: This exam was performed according to the departmental dose-optimization program which includes automated exposure control, adjustment of the mA and/or kV according to patient size and/or use of iterative reconstruction technique. COMPARISON:  04/04/2024 FINDINGS: Lower chest: Large right-sided pleural effusion is again noted. Vascular congestion is again seen. Hepatobiliary: Cholelithiasis without complicating factors. The liver is within normal limits. Pancreas: Unremarkable. No pancreatic ductal dilatation or surrounding inflammatory changes. Spleen: Normal in size without focal abnormality. Adrenals/Urinary Tract: Adrenal glands are within normal limits. Kidneys are atrophic consistent with the known end-stage renal disease. Scattered cysts are noted bilaterally. No follow-up is recommended. Nonobstructing stone is seen in the lower pole of the right kidney. Left-sided nephrostomy catheter is noted and stable. No ureteral stones are seen. The bladder is decompressed. Stomach/Bowel: There are changes consistent with right-sided ileostomy and prior total colectomy. These are stable in appearance. No small bowel obstructive changes are seen. The stomach is within normal limits. Drainage catheter is seen in the presacral region. Vascular/Lymphatic: Aortic atherosclerosis. No enlarged abdominal or  pelvic lymph nodes. Left AV graft is noted. Reproductive: Prostate is unremarkable.  Penile prosthesis is noted. Other: No abdominal wall hernia or abnormality. No abdominopelvic ascites. Musculoskeletal: L5 pars defects are noted with grade 1 anterolisthesis. IMPRESSION: Stable right-sided pleural effusion. Cholelithiasis without complicating factors. Stable right renal calculi.  Stable renal atrophy. Electronically Signed   By: Oneil Devonshire M.D.   On: 04/11/2024 23:08   DG Chest Port 1 View Result Date: 04/11/2024 CLINICAL DATA:  Possible sepsis EXAM: PORTABLE CHEST 1 VIEW COMPARISON:  04/07/2024 FINDINGS: Cardiac shadow is prominent but accentuated by the portable technique. Thoracic aorta is tortuous. Right subclavian vein stent is noted. Mild central vascular congestion is noted with interstitial edema slightly improved when compared with the prior exam. Small right-sided pleural effusion is noted. Basilar atelectasis is seen as well on the right. IMPRESSION: Mild congestive failure. Right basilar atelectasis and associated effusion. Electronically Signed   By: Oneil Devonshire M.D.   On: 04/11/2024 21:35    Assessment/Plan   1. Bilateral lower leg swelling, pain, and redness  - Presents with progressive erythema, edema, erythema, and weeping involving b/l lower legs     - Lactate and WBC are normal, there is no fever, venous Dopplers negative for DVT, findings are symmetric, and he is on ertapenem , all arguing against infectious etiology   - Check ESR and CRP, elevate legs, start topical corticosteroid, follow clinical course    2. Presacral abscess - No residual collection noted on CT in ED  - Continue ertapenem  with dialysis through 04/29/24 per ID recommendations   3. Right pleural effusion  - Likely related to hypervolemia  - Appears stable on imaging in ED, patient has no respiratory complaints   4. ESRD; hypervolemia - Restrict fluids, renally-dose medications, consult nephrology in AM  for maintenance dialysis    5. Orthostatic hypotension; adrenal insufficiency  - Continue midodrine  and Cortef     6. PAF  - Eliquis    7. Asthma   -  Not in exacerbation  - Continue ICS-LABA   8. Ulcerative colitis  - S/p colectomy  - Continue ostomy care  9. Obstructive uropathy   - S/p left nephrostomy tube    DVT prophylaxis: Eliquis   Code Status: DNR/DNI  Level of Care: Level of care: Progressive Family Communication: None present  Disposition Plan:  Patient is from: Home  Anticipated d/c is to: TBD Anticipated d/c date is: 04/13/24  Patient currently: Pending clinical stability, disposition planning  Consults called: None  Admission status: Inpatient     Evalene GORMAN Sprinkles, MD [facility]  04/12/2024, 8:35 PM

## 2024-04-12 NOTE — ED Notes (Signed)
 Pt given oatmeal and cereal and apple juice.

## 2024-04-12 NOTE — ED Notes (Signed)
 Patient unable to provide urine sample as this time.

## 2024-04-12 NOTE — ED Notes (Signed)
 MD notified of patients blood pressure - see new orders.

## 2024-04-13 DIAGNOSIS — R600 Localized edema: Secondary | ICD-10-CM | POA: Diagnosis not present

## 2024-04-13 LAB — CBC
HCT: 32.1 % — ABNORMAL LOW (ref 39.0–52.0)
Hemoglobin: 10.2 g/dL — ABNORMAL LOW (ref 13.0–17.0)
MCH: 37 pg — ABNORMAL HIGH (ref 26.0–34.0)
MCHC: 31.8 g/dL (ref 30.0–36.0)
MCV: 116.3 fL — ABNORMAL HIGH (ref 80.0–100.0)
Platelets: 155 K/uL (ref 150–400)
RBC: 2.76 MIL/uL — ABNORMAL LOW (ref 4.22–5.81)
RDW: 18.1 % — ABNORMAL HIGH (ref 11.5–15.5)
WBC: 6.9 K/uL (ref 4.0–10.5)
nRBC: 2.3 % — ABNORMAL HIGH (ref 0.0–0.2)

## 2024-04-13 LAB — BASIC METABOLIC PANEL WITH GFR
Anion gap: 15 (ref 5–15)
BUN: 60 mg/dL — ABNORMAL HIGH (ref 8–23)
CO2: 22 mmol/L (ref 22–32)
Calcium: 9.2 mg/dL (ref 8.9–10.3)
Chloride: 103 mmol/L (ref 98–111)
Creatinine, Ser: 7.28 mg/dL — ABNORMAL HIGH (ref 0.61–1.24)
GFR, Estimated: 7 mL/min — ABNORMAL LOW (ref >60)
Glucose, Bld: 110 mg/dL — ABNORMAL HIGH (ref 70–99)
Potassium: 4.8 mmol/L (ref 3.5–5.1)
Sodium: 140 mmol/L (ref 135–145)

## 2024-04-13 LAB — CORTISOL-AM, BLOOD: Cortisol - AM: 18 ug/dL (ref 6.7–22.6)

## 2024-04-13 MED ORDER — ALBUMIN HUMAN 25 % IV SOLN
INTRAVENOUS | Status: AC
  Filled 2024-04-13: qty 100

## 2024-04-13 MED ORDER — CALCITRIOL 0.25 MCG PO CAPS
0.5000 ug | ORAL_CAPSULE | ORAL | Status: DC
  Administered 2024-04-13: 0.5 ug via ORAL
  Filled 2024-04-13: qty 2

## 2024-04-13 MED ORDER — RENA-VITE PO TABS
1.0000 | ORAL_TABLET | Freq: Every day | ORAL | Status: DC
  Administered 2024-04-13 – 2024-04-15 (×3): 1 via ORAL
  Filled 2024-04-13 (×3): qty 1

## 2024-04-13 MED ORDER — DORZOLAMIDE HCL 2 % OP SOLN
1.0000 [drp] | Freq: Every day | OPHTHALMIC | Status: DC
  Administered 2024-04-13 – 2024-04-14 (×2): 1 [drp] via OPHTHALMIC
  Filled 2024-04-13: qty 10

## 2024-04-13 MED ORDER — ALBUMIN HUMAN 25 % IV SOLN
25.0000 g | Freq: Once | INTRAVENOUS | Status: AC
  Administered 2024-04-13: 25 g via INTRAVENOUS

## 2024-04-13 MED ORDER — CHLORHEXIDINE GLUCONATE CLOTH 2 % EX PADS
6.0000 | MEDICATED_PAD | Freq: Every day | CUTANEOUS | Status: DC
  Administered 2024-04-13 – 2024-04-15 (×3): 6 via TOPICAL

## 2024-04-13 MED ORDER — CALCITRIOL 0.25 MCG PO CAPS
0.5000 ug | ORAL_CAPSULE | ORAL | Status: DC
  Administered 2024-04-15: 0.5 ug via ORAL

## 2024-04-13 MED ORDER — PANTOPRAZOLE SODIUM 40 MG PO TBEC
40.0000 mg | DELAYED_RELEASE_TABLET | Freq: Two times a day (BID) | ORAL | Status: DC
  Administered 2024-04-13 – 2024-04-15 (×5): 40 mg via ORAL
  Filled 2024-04-13 (×5): qty 1

## 2024-04-13 MED ORDER — CALCITRIOL 0.25 MCG PO CAPS: 0.5000 ug | ORAL_CAPSULE | ORAL | Status: DC

## 2024-04-13 MED ORDER — VITAMIN B-12 1000 MCG PO TABS
1000.0000 ug | ORAL_TABLET | Freq: Every day | ORAL | Status: DC
  Administered 2024-04-13 – 2024-04-15 (×3): 1000 ug via ORAL
  Filled 2024-04-13 (×3): qty 1

## 2024-04-13 NOTE — Consult Note (Signed)
 WOC Nurse Consult Note: Reason for Consult: Needs wound orders for Unna boots looks like may be able to do okay with Aquacel?  Wound type: venous in the presence of recent falls as well; bruising at each knee Partial thickness open ulcers bilateral pretibial area Pressure Injury POA: NA Measurement: see nursing flow sheets Wound bed: ulcers are 100% clean and pink Drainage (amount, consistency, odor)  see nursing flow sheets Periwound:intact, LEs do not appear to be edematous at this time.  Dressing procedure/placement/frequency: Cleanse bilateral LE ulcerations with saline, pat dry. Cover each wound with single layer of xeroform and top with foam. Wrap bilateral LE with kerlix from the toes to the knees and follow with 3 ACE wraps (in the same manner from the base of the toes to the knees.   Change every other day Incidentally noted patient has ostomy as well. Ileostomy since 2011  WOC team with longstanding ileostomy  Stoma type/location: ileostomy since 2011  Treatment options for stomal/peristomal skin: 2 barrier ring  Output see nursing flow sheets Ostomy pouching: 1 piece Soft convex lawson #[id number], barrier ring  Gerlean [phone] Education provided: none, longstanding ostomy.  OK for bedside nurse or patient to change pouch.    If skin denuded,weeping, broken down around stoma crust skin as follows:  Sprinkle stoma powder  Soila #6) over any irritated skin, brush away excess powder Tap lightly over the powder with Cavilon No-Sting barrier wipe (skin barrier wipe-white and blue package) or gloved finger damp with water Allow to dry Apply another layer of powder following the same steps up to three layers.  After dry proceed with routine pouching      Re consult if needed, will not follow at this time. Thanks  Cheston Coury [employer], RN,CWOCN, CNS, [employer] [phone]

## 2024-04-13 NOTE — Plan of Care (Signed)
   Problem: Health Behavior/Discharge Planning: Goal: Ability to manage health-related needs will improve Outcome: Progressing

## 2024-04-13 NOTE — Consult Note (Signed)
 [facility] Renal Consultation Note    Indication for Consultation:  Management of ESRD/hemodialysis; anemia, hypertension/volume and secondary hyperparathyroidism  ERE:Ylwuzm, Garnette KIDD, MD  HPI: Joshua Riley is a 88 y.o. male with ESRD on HD MWF at [facility]. He has a past medical history for ulcerative colitis (s/p colectomy), atrial fibrillation (on Eliquis ), asthma, adrenal insufficiency, chronic orthostatic hypotension, and obstructive uropathy with left nephrostomy tube.  Patient presented to [facility] ED yesterday c/o bilateral lower extremity redness, pain, and leg weeping. Noted blood cxs were obtained and he was given ABXs, steroids, IVFs, and Midodrine  for chronic hypotension. Patient then was transferred to [facility] for further evaluation.   Seen and examined patient at bedside. Noted patient was receiving IV ertapenem  at his outpatient dialysis center for recent presacral abscess. Currently denies SOB, CP, and N/V. Currently on O2 via [place]. Current renal labs are stable. Plan for HD today per his routine schedule.  Past Medical History:  Diagnosis Date   A-fib (HCC)    Anemia    Arthritis    Cancer (HCC)    Basal cell   COVID-19    2021   Dysrhythmia    Afib-controlled on eliquis    ESRD (end stage renal disease) (HCC) 10/22/2021   Glaucoma 11/18/2021   History of DVT (deep vein thrombosis)    Hydronephrosis    managed wtih a PCN   Idiopathic neuropathy 10/22/2021   lyrica     Ileostomy in place [facility])    Obstructive uropathy    With chronic left nephrostomy   Old retinal detachment, total or subtotal    Orthostatic hypotension 10/22/2021   Sleep apnea    does not need a machine   Stroke (HCC)    Ulcerative colitis (HCC)    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   Past Surgical History:  Procedure Laterality Date   BASAL CELL CARCINOMA EXCISION     10/23   COLON SURGERY     creation of j pouch     and subsequent  takedown of j pouch   EYE SURGERY     IR CATHETER TUBE CHANGE  12/18/2023   IR CATHETER TUBE CHANGE  02/19/2024   IR CATHETER TUBE CHANGE  02/19/2024   IR NEPHROSTOMY EXCHANGE LEFT  12/10/2021   IR NEPHROSTOMY EXCHANGE LEFT  04/22/2022   IR NEPHROSTOMY EXCHANGE LEFT  07/29/2022   IR NEPHROSTOMY EXCHANGE LEFT  10/28/2022   IR NEPHROSTOMY EXCHANGE LEFT  02/05/2023   IR NEPHROSTOMY EXCHANGE LEFT  05/07/2023   IR NEPHROSTOMY EXCHANGE LEFT  06/25/2023   IR NEPHROSTOMY EXCHANGE LEFT  07/17/2023   IR NEPHROSTOMY EXCHANGE LEFT  10/22/2023   IR RADIOLOGIST EVAL & MGMT  11/25/2023   IR RADIOLOGIST EVAL & MGMT  12/18/2023   IR RADIOLOGIST EVAL & MGMT  02/19/2024   IR RADIOLOGIST EVAL & MGMT  04/04/2024   IR THROMBECTOMY AV FISTULA W/THROMBOLYSIS/PTA INC/SHUNT/IMG LEFT Left 02/23/2024   IR US  GUIDE VASC ACCESS LEFT  02/23/2024   LEFT HEART CATH AND CORONARY ANGIOGRAPHY N/A 07/22/2022   Procedure: LEFT HEART CATH AND CORONARY ANGIOGRAPHY;  Surgeon: Swaziland, Peter M, MD;  Location: [facility] INVASIVE CV LAB;  Service: Cardiovascular;  Laterality: N/A;   REVISION OF ARTERIOVENOUS GORETEX GRAFT Left 05/06/2022   Procedure: REDO LEFT THIGH ARTERIOVENOUS 4-7 MM GORETEX GRAFT;  Surgeon: Eliza Lonni RAMAN, MD;  Location: [facility] OR;  Service: Vascular;  Laterality: Left;   SMALL INTESTINE SURGERY     TOTAL COLECTOMY  Family History  Problem Relation Age of Onset   Stroke Mother    Cancer Father    Esophageal cancer Brother    Social History:  reports that he quit smoking about 40 years ago. His smoking use included cigarettes. He started smoking about 46 years ago. He has a 12 pack-year smoking history. He has never been exposed to tobacco smoke. He has never used smokeless tobacco. He reports current alcohol use of about 5.0 standard drinks of alcohol per week. He reports that he does not use drugs. Allergies  Allergen Reactions   Baclofen Other (See Comments)    Altered mental status, after accidental overdose      Cephalosporins Rash   Ciprofloxacin Itching and Rash   Wound Dressing Adhesive Rash   Prior to Admission medications   Medication Sig Start Date End Date Taking? Authorizing Provider  atorvastatin  (LIPITOR) 20 MG tablet Take 1 tablet (20 mg total) by mouth daily. 11/14/23  Yes Sebastian Toribio GAILS, MD  calcitRIOL  (ROCALTROL ) 0.5 MCG capsule Take 1 capsule (0.5 mcg total) by mouth every Monday, Wednesday, and Friday with hemodialysis. 12/04/23  Yes Danford, Lonni SQUIBB, MD  Cyanocobalamin  (VITAMIN B-12 PO) Take 1 capsule by mouth in the morning and at bedtime.   Yes [provider]  dorzolamide  (TRUSOPT ) 2 % ophthalmic solution Place 1 drop into the right eye at bedtime. 06/17/23  Yes [provider]  Doxylamine Succinate, Sleep, (SLEEP AID PO) Take 1 Dose by mouth at bedtime. Sleep aid of unknown name. OTC.   Yes [provider]  ELIQUIS  2.5 MG TABS tablet Take 1 tablet (2.5 mg total) by mouth 2 (two) times daily. 02/17/24  Yes Katrinka Garnette KIDD, MD  folic acid  (FOLVITE ) 1 MG tablet Take 1 tablet (1 mg total) by mouth daily. 07/21/23  Yes Regalado, Belkys A, MD  gabapentin  (NEURONTIN ) 300 MG capsule TAKE 1 CAPSULE BY MOUTH ONCE A DAY AT BEDTIME FOR NEUROPATHY 03/21/24  Yes Katrinka Garnette KIDD, MD  hydrocortisone  (CORTEF ) 20 MG tablet Take 1 tablet (20 mg total) by mouth daily. 03/18/24  Yes Dennise Lavada POUR, MD  mometasone -formoterol  (DULERA ) 200-5 MCG/ACT AERO Inhale 2 puffs into the lungs 2 (two) times daily. 04/07/24  Yes Katrinka Garnette KIDD, MD  multivitamin (RENA-VIT) TABS tablet Take 1 tablet by mouth daily.   Yes [provider]  Omega Fatty Acids-Vitamins (OMEGA-3 GUMMIES) CHEW Chew 1,200 mg by mouth in the morning and at bedtime.   Yes [provider]  pantoprazole  (PROTONIX ) 40 MG tablet Take 1 tablet (40 mg total) by mouth 2 (two) times daily. 11/13/23  Yes Sebastian Toribio GAILS, MD  ramelteon  (ROZEREM ) 8 MG tablet Take 1 tablet (8 mg total) by mouth at  bedtime. 10/27/23  Yes Katrinka Garnette KIDD, MD  timolol  (TIMOPTIC ) 0.5 % ophthalmic solution Place 1 drop into both eyes in the morning and at bedtime.   Yes [provider]  traMADol  (ULTRAM ) 50 MG tablet Take 1 tablet (50 mg total) by mouth every 12 (twelve) hours as needed (for pain). 04/07/24  Yes Katrinka Garnette KIDD, MD  TYLENOL  325 MG tablet Take 325-650 mg by mouth every 6 (six) hours as needed for mild pain (pain score 1-3) or headache.   Yes [provider]  Sodium Chloride  Flush (SALINE FLUSH) 0.9 % SOLN Use 5 mLs by Intracatheter route daily as directed. 11/25/23   [place], Daved SAILOR, PA   Current Facility-Administered Medications  Medication Dose Route Frequency Provider Last Rate Last Admin  acetaminophen  (TYLENOL ) tablet 650 mg  650 mg Oral Q6H PRN Opyd, Timothy S, MD   650 mg at 04/12/24 2159   Or   acetaminophen  (TYLENOL ) suppository 650 mg  650 mg Rectal Q6H PRN Opyd, Evalene RAMAN, MD       apixaban  (ELIQUIS ) tablet 2.5 mg  2.5 mg Oral BID Opyd, Timothy S, MD   2.5 mg at 04/13/24 1025   atorvastatin  (LIPITOR) tablet 20 mg  20 mg Oral Daily Opyd, Timothy S, MD   20 mg at 04/13/24 1025   Chlorhexidine  Gluconate Cloth 2 % PADS 6 each  6 each Topical Q0600 Lenon Charmaine BRAVO, NP   6 each at 04/13/24 1025   ertapenem  (INVANZ ) 1 g in sodium chloride  0.9 % 100 mL IVPB  1 g Intravenous Q M,W,F-1800 Opyd, Timothy S, MD       fluticasone  furoate-vilanterol (BREO ELLIPTA ) 100-25 MCG/ACT 1 puff  1 puff Inhalation Daily Opyd, Evalene RAMAN, MD   1 puff at 04/13/24 1024   gabapentin  (NEURONTIN ) capsule 300 mg  300 mg Oral QHS Opyd, Evalene RAMAN, MD   300 mg at 04/12/24 2159   hydrocortisone  (CORTEF ) tablet 10 mg  10 mg Oral QPM Opyd, Evalene RAMAN, MD   10 mg at 04/12/24 2200   hydrocortisone  (CORTEF ) tablet 20 mg  20 mg Oral Daily Opyd, Evalene RAMAN, MD   20 mg at 04/13/24 1024   hydrocortisone  cream 0.5 %   Topical BID Charlton Evalene RAMAN, MD   Given at 04/13/24 9083   midodrine  (PROAMATINE )  tablet 30 mg  30 mg Oral TID Opyd, Timothy S, MD   30 mg at 04/13/24 0700   prochlorperazine (COMPAZINE) injection 5 mg  5 mg Intravenous Q6H PRN Opyd, Evalene RAMAN, MD       ramelteon  (ROZEREM ) tablet 8 mg  8 mg Oral QHS Opyd, Evalene RAMAN, MD   8 mg at 04/12/24 2200   senna (SENOKOT) tablet 8.6 mg  1 tablet Oral Daily PRN Opyd, Evalene RAMAN, MD       sodium chloride  flush (NS) 0.9 % injection 3 mL  3 mL Intravenous Q12H Opyd, Evalene RAMAN, MD   3 mL at 04/13/24 0917   timolol  (TIMOPTIC ) 0.5 % ophthalmic solution 1 drop  1 drop Both Eyes BID Opyd, Evalene RAMAN, MD   1 drop at 04/13/24 0917   traMADol  (ULTRAM ) tablet 50 mg  50 mg Oral Q12H PRN Charlton Evalene RAMAN, MD       Labs: Basic Metabolic Panel: Recent Labs  Lab 04/11/24 2110 04/12/24 1337 04/13/24 0243  NA 141 139 140  K 5.4* 5.1 4.8  CL 102 101 103  CO2 21* 23 22  GLUCOSE 115* 166* 110*  BUN 41* 48* 60*  CREATININE 5.49* 6.09* 7.28*  CALCIUM  10.0 9.3 9.2   Liver Function Tests: Recent Labs  Lab 04/11/24 2110  AST 42*  ALT 23  ALKPHOS 140*  BILITOT 0.5  PROT 6.7  ALBUMIN  4.2   No results for input(s): LIPASE, AMYLASE in the last 168 hours. No results for input(s): AMMONIA in the last 168 hours. CBC: Recent Labs  Lab 04/11/24 2110 04/13/24 0243  WBC 6.4 6.9  NEUTROABS 4.9  --   HGB 11.1* 10.2*  HCT 34.4* 32.1*  MCV 115.4* 116.3*  PLT 191 155   Cardiac Enzymes: No results for input(s): CKTOTAL, CKMB, CKMBINDEX, TROPONINI in the last 168 hours. CBG: No results for input(s): GLUCAP in the last 168 hours. Iron Studies: No results  for input(s): IRON, TIBC, TRANSFERRIN, FERRITIN in the last 72 hours. Studies/Results: US  Venous Img Lower Bilateral Result Date: 04/12/2024 CLINICAL DATA:  Bilateral leg swelling EXAM: BILATERAL LOWER EXTREMITY VENOUS DOPPLER ULTRASOUND TECHNIQUE: Gray-scale sonography with graded compression, as well as color Doppler and duplex ultrasound were performed to evaluate the lower  extremity deep venous systems from the level of the common femoral vein and including the common femoral, femoral, profunda femoral, popliteal and calf veins including the posterior tibial, peroneal and gastrocnemius veins when visible. The superficial great saphenous vein was also interrogated. Spectral Doppler was utilized to evaluate flow at rest and with distal augmentation maneuvers in the common femoral, femoral and popliteal veins. COMPARISON:  None Available. FINDINGS: RIGHT LOWER EXTREMITY Common Femoral Vein: No evidence of thrombus. Normal compressibility, respiratory phasicity and response to augmentation. Saphenofemoral Junction: No evidence of thrombus. Normal compressibility and flow on color Doppler imaging. Profunda Femoral Vein: No evidence of thrombus. Normal compressibility and flow on color Doppler imaging. Femoral Vein: No evidence of thrombus. Normal compressibility, respiratory phasicity and response to augmentation. Popliteal Vein: No evidence of thrombus. Normal compressibility, respiratory phasicity and response to augmentation. Calf Veins: No evidence of thrombus. Normal compressibility and flow on color Doppler imaging. Superficial Great Saphenous Vein: No evidence of thrombus. Normal compressibility and flow on color Doppler imaging. Venous Reflux:  None. Other Findings:  Mild edema is noted. LEFT LOWER EXTREMITY Common Femoral Vein: No evidence of thrombus. Normal compressibility, respiratory phasicity and response to augmentation. Saphenofemoral Junction: No evidence of thrombus. Normal compressibility and flow on color Doppler imaging. Profunda Femoral Vein: No evidence of thrombus. Normal compressibility and flow on color Doppler imaging. Femoral Vein: No evidence of thrombus. Normal compressibility, respiratory phasicity and response to augmentation. Popliteal Vein: No evidence of thrombus. Normal compressibility, respiratory phasicity and response to augmentation. Calf Veins: No  evidence of thrombus. Normal compressibility and flow on color Doppler imaging. Superficial Great Saphenous Vein: No evidence of thrombus. Normal compressibility and flow on color Doppler imaging. Venous Reflux:  None. Other Findings:  Mild edema is noted. IMPRESSION: No evidence of deep venous thrombosis. Electronically Signed   By: Oneil Devonshire M.D.   On: 04/12/2024 00:55   CT ABDOMEN PELVIS WO CONTRAST Result Date: 04/11/2024 CLINICAL DATA:  Recent fall and findings suspicious for sepsis EXAM: CT ABDOMEN AND PELVIS WITHOUT CONTRAST TECHNIQUE: Multidetector CT imaging of the abdomen and pelvis was performed following the standard protocol without IV contrast. RADIATION DOSE REDUCTION: This exam was performed according to the departmental dose-optimization program which includes automated exposure control, adjustment of the mA and/or kV according to patient size and/or use of iterative reconstruction technique. COMPARISON:  04/04/2024 FINDINGS: Lower chest: Large right-sided pleural effusion is again noted. Vascular congestion is again seen. Hepatobiliary: Cholelithiasis without complicating factors. The liver is within normal limits. Pancreas: Unremarkable. No pancreatic ductal dilatation or surrounding inflammatory changes. Spleen: Normal in size without focal abnormality. Adrenals/Urinary Tract: Adrenal glands are within normal limits. Kidneys are atrophic consistent with the known end-stage renal disease. Scattered cysts are noted bilaterally. No follow-up is recommended. Nonobstructing stone is seen in the lower pole of the right kidney. Left-sided nephrostomy catheter is noted and stable. No ureteral stones are seen. The bladder is decompressed. Stomach/Bowel: There are changes consistent with right-sided ileostomy and prior total colectomy. These are stable in appearance. No small bowel obstructive changes are seen. The stomach is within normal limits. Drainage catheter is seen in the presacral region.  Vascular/Lymphatic: Aortic atherosclerosis. No  enlarged abdominal or pelvic lymph nodes. Left AV graft is noted. Reproductive: Prostate is unremarkable.  Penile prosthesis is noted. Other: No abdominal wall hernia or abnormality. No abdominopelvic ascites. Musculoskeletal: L5 pars defects are noted with grade 1 anterolisthesis. IMPRESSION: Stable right-sided pleural effusion. Cholelithiasis without complicating factors. Stable right renal calculi.  Stable renal atrophy. Electronically Signed   By: Oneil Devonshire M.D.   On: 04/11/2024 23:08   DG Chest Port 1 View Result Date: 04/11/2024 CLINICAL DATA:  Possible sepsis EXAM: PORTABLE CHEST 1 VIEW COMPARISON:  04/07/2024 FINDINGS: Cardiac shadow is prominent but accentuated by the portable technique. Thoracic aorta is tortuous. Right subclavian vein stent is noted. Mild central vascular congestion is noted with interstitial edema slightly improved when compared with the prior exam. Small right-sided pleural effusion is noted. Basilar atelectasis is seen as well on the right. IMPRESSION: Mild congestive failure. Right basilar atelectasis and associated effusion. Electronically Signed   By: Oneil Devonshire M.D.   On: 04/11/2024 21:35    ROS: All others negative except those listed in HPI.   Physical Exam: Vitals:   04/13/24 0200 04/13/24 0400 04/13/24 0800 04/13/24 0859  BP:  (!) 80/57 (!) 84/57 (!) 78/58  Pulse: 70 81 90 92  Resp: 20 (!) 21 20   Temp:  98.2 F (36.8 C) (!) 97.5 F (36.4 C)   TempSrc:  Oral Oral   SpO2: 98% 96% 100%   Weight:      Height:         General: Awake, alert, NAD, on 2L [place]: Sclera not icteric  Lungs: Clear anteriorly. No wheeze, rales or rhonchi. Breathing is unlabored. Heart: RRR. No murmur, rubs or gallops.  Abdomen: soft and non-tender Lower extremities: erythema noted with 1+ edema b/l LE Neuro: AAOx3. Moves all extremities spontaneously. Dialysis Access: L AVG  Dialysis Orders:  MWF - [facility] 2hrs20min, BFR 400, DFR Auto 1.5,  EDW 72kg, 2K/ 2.5Ca Heparin  None ordered Mircera 150 mcg q2wks - last 9/29 Calcitriol  0.5mcg PO qHD - last 10/6  Last Labs: Hgb 10.2,  K 4.8, Ca 9.2  Assessment/Plan: Bilateral lower extremity erythema and edema - Dopplers (-) for DVT, on topical corticosteroid, per PCP. Current clinical picture not consistent with infection Presacral abscess - Continue ertapenem  with dialysis through 04/29/24 per ID recommendations  ESRD - on HD MWF. HD today per routine schedule Hypotension/volume  - Continue high-dose Midodrine  for chronic hypotension Anemia of CKD - Hgb 10.2. Last ESA given 9/29, not due yet. No IV Fe while on ABXs Secondary Hyperparathyroidism -  Checking phos in AM. Resume Calcitriol  Nutrition - Renal diet with fluid restriction  Charmaine Piety, NP [facility] 04/13/2024, 12:19 PM

## 2024-04-13 NOTE — Plan of Care (Signed)

## 2024-04-13 NOTE — Progress Notes (Signed)
 Orthopedic Tech Progress Note Patient Details:  Joshua Riley 1936-01-17 [id number]  Ortho Devices Type of Ortho Device: Radio broadcast assistant Ortho Device/Splint Location: bilateral Ortho Device/Splint Interventions: Ordered, Application, Adjustment   Post Interventions Patient Tolerated: Well  [name] 04/13/2024, 3:56 PM

## 2024-04-13 NOTE — TOC Initial Note (Signed)
 Transition of Care [facility]) - Initial/Assessment Note    Patient Details  Name: Joshua Riley MRN: [id number] Date of Birth: August 29, 1935  Transition of Care [facility]) CM/SW Contact:    Andrez JULIANNA George, RN Phone Number: 04/13/2024, 12:10 PM  Clinical Narrative:                 Joshua Riley is a 88 y.o. male with medical history significant for ulcerative colitis status post colectomy, atrial fibrillation on Eliquis , asthma, adrenal insufficiency, orthostatic hypotension, ESRD on hemodialysis, obstructive uropathy with left nephrostomy tube, and recent presacral abscess currently on ertapenem  with dialysis who presents for evaluation of bilateral lower extremity redness, swelling, weeping, and pain.   Pt is from home alone. His daughter lives 20 min away and can check on him periodically. Pt drives short distances but uses [employer] transportation for HD.  Pt manages his own medications and denies issues.   IP Care management following.  Expected Discharge Plan: Home/Self Care Barriers to Discharge: Continued Medical Work up   Patient Goals and CMS Choice            Expected Discharge Plan and Services   Discharge Planning Services: CM Consult   Living arrangements for the past 2 months: Apartment                                      Prior Living Arrangements/Services Living arrangements for the past 2 months: Apartment Lives with:: Self Patient language and need for interpreter reviewed:: Yes Do you feel safe going back to the place where you live?: Yes        Care giver support system in place?: No (comment) Current home services: DME (shower seat/ cane/ walker/ [facility]) Criminal Activity/Legal Involvement Pertinent to Current Situation/Hospitalization: No - Comment as needed  Activities of Daily Living   ADL Screening (condition at time of admission) Independently performs ADLs?: No Does the patient have a NEW difficulty with  bathing/dressing/toileting/self-feeding that is expected to last >3 days?: Yes (Initiates electronic notice to provider for possible OT consult) Does the patient have a NEW difficulty with getting in/out of bed, walking, or climbing stairs that is expected to last >3 days?: Yes (Initiates electronic notice to provider for possible PT consult) Does the patient have a NEW difficulty with communication that is expected to last >3 days?: No Is the patient deaf or have difficulty hearing?: Yes Does the patient have difficulty seeing, even when wearing glasses/contacts?: No Does the patient have difficulty concentrating, remembering, or making decisions?: No  Permission Sought/Granted                  Emotional Assessment Appearance:: Appears stated age Attitude/Demeanor/Rapport: Engaged Affect (typically observed): Accepting Orientation: : Oriented to Self, Oriented to Place, Oriented to  Time, Oriented to Situation   Psych Involvement: No (comment)  Admission diagnosis:  Sepsis [facility]) [A41.9] Patient Active Problem List   Diagnosis Date Noted   Bilateral leg edema 04/12/2024   Obstructive uropathy    SIRS (systemic inflammatory response syndrome) (HCC) 03/14/2024   Dialysis complication 02/22/2024   Presacral Abscess 02/22/2024   History of small bowel obstruction 02/11/2024   Adrenal insufficiency 02/11/2024   Asthma 02/11/2024   Small bowel obstruction (HCC) 01/25/2024   Cerebrovascular disease 12/02/2023   Seizure like activity 11/28/2023   Acute pericarditis 11/11/2023   Abscess of abdominal cavity (HCC) 07/17/2023   ESBL E. coli  carrier 07/14/2023   Anastomotic leak of intestine 07/14/2023   History of anemia due to chronic kidney disease 07/08/2023   Abscess of groin, left 07/07/2023   Macrocytic anemia 04/08/2023   Depression 04/08/2023   Abnormal bladder CT 03/23/2023   Infection due to ESBL-producing Escherichia coli 03/23/2023   DOE (dyspnea on exertion) 07/22/2022    Coag negative Staphylococcus bacteremia 06/06/2022   History of sepsis 06/02/2022   Ulcerative colitis (HCC) 06/02/2022   End-stage renal disease on hemodialysis (HCC) 05/06/2022   SBO (small bowel obstruction) (HCC) 11/18/2021   Glaucoma 11/18/2021   Paroxysmal atrial fibrillation (HCC) 11/18/2021   Orthostatic hypotension 10/22/2021   Idiopathic neuropathy 10/22/2021   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   Nephrostomy present (HCC) 10/22/2021   PCP:  Katrinka Garnette KIDD, MD Pharmacy:   [facility] [phone] [address] [place], [place] - [address] [place] [address] Phone: [phone] Fax: [phone]  [name] [address] [place] [address] Phone: [phone] Fax: [phone]     Social Drivers of Health (SDOH) Social History: SDOH Screenings   Food Insecurity: No Food Insecurity (04/13/2024)  Housing: Low Risk  (04/13/2024)  Transportation Needs: No Transportation Needs (04/13/2024)  Utilities: Not At Risk (04/13/2024)  Alcohol Screen: Low Risk  (10/26/2023)  Depression (PHQ2-9): Medium Risk (04/07/2024)  Financial Resource Strain: Low Risk  (10/26/2023)  Physical Activity: Insufficiently Active (10/26/2023)  Social Connections: Patient Declined (04/13/2024)  Recent Concern: Social Connections - Socially Isolated (01/27/2024)  Stress: Stress Concern Present (10/26/2023)  Tobacco Use: Medium Risk (04/12/2024)   SDOH Interventions:     Readmission Risk Interventions    12/03/2023   11:30 AM 11/10/2023    3:46 PM 07/21/2023   12:26 PM  Readmission Risk Prevention Plan  Transportation Screening Complete Complete Complete  PCP or Specialist Appt within 5-7 Days  Complete   Home Care Screening  Complete   Medication Review (RN CM)  Complete   HRI or Home Care Consult   Complete  Social Work Consult for Recovery Care Planning/Counseling   Complete  Palliative Care Screening   Not Applicable  Medication Review Special educational needs teacher)   Complete  SW Recovery Care/Counseling Consult Complete    Palliative Care Screening Complete    Skilled Nursing Facility Complete

## 2024-04-13 NOTE — Progress Notes (Signed)
 Pt receives out-pt HD at [facility], MWF, [place] chair time. Will continue to assists as needed.   Lavanda Ameah Chanda Dialysis Navigator [phone]

## 2024-04-13 NOTE — Progress Notes (Signed)
 [facility]   ROUNDING   NOTE Joshua Riley FMW:[id number]  DOB: September 06, 1935  DOA: 04/11/2024  PCP: Katrinka Garnette KIDD, MD  04/13/2024,4:48 AM  LOS: 1 day    Code Status: Full     from: Home   88 year old white male ESRD MWF-recent complication clotted dialysis access-has right femoral tunneled HD cath 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 with surgery 2011 Anal anastomosis surgery 2012 Ulcerative colitis left-sided abdominal ileostomy colostomy-recent bowel obstruction 01/2024 Paroxysmal A-fib CHADVASC >4 on Eliquis  Chronic hypotension with admission 01/25/2024 severe hypotension midodrine  increased to 30 3 times daily in the setting of known adrenal insufficiency not on hydrocortisone  Sacral osteomyelitis completing prolonged ertapenem  vancomycin   Recent hospitalization -- sepsis related to chronic abdominal abscess cavity with prececal drain getting dislodged showing erythema concerning for potential sepsis in presacral area as source of infection IR placed left gluteal drain initial cultures negative at discharge became positive for multidrug-resistant E. coli-[facility] infectious disease and he was changed to IV ertapenem .  His first dose 9/29  10/7 came into emergency room progressive redness of bilateral lower extremities weeping from leg no fever + chills  Labs on admit Calcium  5.4 BUN/creatinine 41/5.4 alk phos 140 AST/ALT 42/23 WBC 6.4 hemoglobin 11 predominant neutrophilia are 16 Blood cultures obtained   Imaging CXR mild congestive failure right basilar atelectasis effusion-CT ABD pelvis stable right pleural effusion cholelithiasis without complicating factors stable right renal calculi US  lower extremities no DVT   Assessment  & Plan :    ?  Bilateral lower extremity cellulitis-unfounded Is on ertapenem  which would rule out any type of superinfection and bilateral cellulitis is not a normal situation Feel this is more in keeping with under dialysis and have asked  nephrology to see Duration antibiotics as below ESRD MWF complications of clotted femoral tunneled HD cath Severe orthostatic hypotension on max dose midodrine  30 3 times daily Severe adrenal insufficiency on Cortef  Not convinced that he is compliant on meds--told daugter that he was taking midodrine  twice daily instead of 3 times daily--he cannot necessarily tell me his meds at home Upper limit of midodrine  dosing usually is 20 3 times daily patient is on 30 3 times daily Obtain free a.m. cortisol on current Cortef  10 PM 20 a.m. 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 coli on current ertapenem  per ID since 9/29--- EOT 04/25/2024 per Dr. Junior note Patient declines my review of the wounds on his bottom today and wants  me to assess tomorrow Continues ertapenem  as above Will courtesy inform Dr. Luiz of his admission   Data Reviewed:   Sodium 140 potassium 4.8 BUN/creatinine 16/70.2 WBC 6.9 hemoglobin 10.2 platelet 155 ESR 16  DVT prophylaxis: Apixaban   Status is: Inpatient Remains inpatient appropriate because: Requires further management     Current Dispo: Inpatient for the next 1 to 2 days     Subjective:    He is hypotensive but asymptomatic No complaints wants regular diet no pain no fever  no chills    Objective + exam Vitals:   04/13/24  0020 04/13/24 0039 04/13/24 0200 04/13/24 0400  BP: (!) 85/60 (!) 91/59  (!) 80/57  Pulse: 60 74 70 81  Resp: (!) 24 20 20  (!) 21  Temp: 98 F (36.7 C)     TempSrc: Oral     SpO2: 97% 95% 98% 96%  Weight:      Height:       Filed Weights   04/11/24 2035 04/12/24 2000  Weight: 71.5 kg 76.5 kg     Examination: EOMI NCAT no focal deficit no icterus no pallor CTAB no added sound S1-S2 no murmur but does have A-fib on monitors He does have a left femoral catheter in place for dialysis He has a Foley catheter he has a urostomy and end ileostomy Otherwise abdomen is soft No real lower extremity edema some erythema     Scheduled Meds:  apixaban   2.5 mg Oral BID   atorvastatin   20 mg Oral Daily   fluticasone  furoate-vilanterol  1 puff Inhalation Daily   gabapentin   300 mg Oral QHS   hydrocortisone   10 mg Oral QPM   hydrocortisone   20 mg Oral Daily   hydrocortisone  cream   Topical BID   midodrine   30 mg Oral TID   ramelteon   8 mg Oral QHS   sodium chloride  flush  3 mL Intravenous Q12H   timolol   1 drop Both Eyes BID   Continuous Infusions:  ertapenem       Time 60  Jai-Gurmukh Christerpher Clos, MD  [facility]

## 2024-04-14 DIAGNOSIS — R600 Localized edema: Secondary | ICD-10-CM | POA: Diagnosis not present

## 2024-04-14 LAB — BASIC METABOLIC PANEL WITH GFR
Anion gap: 18 — ABNORMAL HIGH (ref 5–15)
BUN: 59 mg/dL — ABNORMAL HIGH (ref 8–23)
CO2: 17 mmol/L — ABNORMAL LOW (ref 22–32)
Calcium: 8.7 mg/dL — ABNORMAL LOW (ref 8.9–10.3)
Chloride: 101 mmol/L (ref 98–111)
Creatinine, Ser: 6.45 mg/dL — ABNORMAL HIGH (ref 0.61–1.24)
GFR, Estimated: 8 mL/min — ABNORMAL LOW (ref >60)
Glucose, Bld: 83 mg/dL (ref 70–99)
Potassium: 4.6 mmol/L (ref 3.5–5.1)
Sodium: 136 mmol/L (ref 135–145)

## 2024-04-14 LAB — MISC LABCORP TEST (SEND OUT): Labcorp test code: 6510

## 2024-04-14 LAB — CBC
HCT: 32.9 % — ABNORMAL LOW (ref 39.0–52.0)
Hemoglobin: 10.6 g/dL — ABNORMAL LOW (ref 13.0–17.0)
MCH: 37.6 pg — ABNORMAL HIGH (ref 26.0–34.0)
MCHC: 32.2 g/dL (ref 30.0–36.0)
MCV: 116.7 fL — ABNORMAL HIGH (ref 80.0–100.0)
Platelets: 127 K/uL — ABNORMAL LOW (ref 150–400)
RBC: 2.82 MIL/uL — ABNORMAL LOW (ref 4.22–5.81)
RDW: 18.3 % — ABNORMAL HIGH (ref 11.5–15.5)
WBC: 7.2 K/uL (ref 4.0–10.5)
nRBC: 2.6 % — ABNORMAL HIGH (ref 0.0–0.2)

## 2024-04-14 LAB — CORTISOL-AM, BLOOD: Cortisol - AM: 24.9 ug/dL — ABNORMAL HIGH (ref 6.7–22.6)

## 2024-04-14 LAB — HEPATITIS B SURFACE ANTIBODY, QUANTITATIVE: Hep B S AB Quant (Post): 3.8 m[IU]/mL — ABNORMAL LOW

## 2024-04-14 NOTE — Plan of Care (Signed)
   Problem: Clinical Measurements: Goal: Ability to maintain clinical measurements within normal limits will improve Outcome: Progressing   Problem: Clinical Measurements: Goal: Will remain free from infection Outcome: Progressing

## 2024-04-14 NOTE — Progress Notes (Signed)
 [facility] Progress Note   Subjective:  Seen in room. Continues to have pain in both legs, asking for pain meds this am. He signed off dialysis early due to the pain.   Objective Vitals:   04/14/24 0130 04/14/24 0317 04/14/24 0400 04/14/24 0855  BP: (!) 73/55 (!) 80/57 (!) 98/59 (!) 81/56  Pulse: 86 72 77 86  Resp: 20 16 14 18   Temp: 97.8 F (36.6 C) 97.6 F (36.4 C)  97.6 F (36.4 C)  TempSrc: Oral Oral  Oral  SpO2: 93% 92% 96% 96%  Weight:      Height:         Additional Objective Labs: Basic Metabolic Panel: Recent Labs  Lab 04/11/24 2110 04/12/24 1337 04/13/24 0243  NA 141 139 140  K 5.4* 5.1 4.8  CL 102 101 103  CO2 21* 23 22  GLUCOSE 115* 166* 110*  BUN 41* 48* 60*  CREATININE 5.49* 6.09* 7.28*  CALCIUM  10.0 9.3 9.2   CBC: Recent Labs  Lab 04/11/24 2110 04/13/24 0243  WBC 6.4 6.9  NEUTROABS 4.9  --   HGB 11.1* 10.2*  HCT 34.4* 32.1*  MCV 115.4* 116.3*  PLT 191 155   Blood Culture    Component Value Date/Time   SDES BLOOD RIGHT ARM 04/12/2024 2030   SPECREQUEST  04/12/2024 2030    BOTTLES DRAWN AEROBIC AND ANAEROBIC Blood Culture adequate volume   CULT  04/12/2024 2030    NO GROWTH 2 DAYS Performed at [facility], [address] [place], [place] [address]    REPTSTATUS PENDING 04/12/2024 2030     Physical Exam General: Elderly man, nad  Heart: RRR Resp: Normal wob Abdomen: non tender Extremities: Bilat LE bandaged  Dialysis Access: AVG   Medications:  ertapenem  Stopped (04/14/24 0231)    apixaban   2.5 mg Oral BID   atorvastatin   20 mg Oral Daily   [START ON 04/15/2024] calcitRIOL   0.5 mcg Oral Q M,W,F-HD   Chlorhexidine  Gluconate Cloth  6 each Topical Q0600   vitamin B-12  1,000 mcg Oral Daily   dorzolamide   1 drop Right Eye QHS   fluticasone  furoate-vilanterol  1 puff Inhalation Daily   gabapentin   300 mg Oral QHS   hydrocortisone   10 mg Oral QPM   hydrocortisone   20 mg Oral Daily   hydrocortisone   cream   Topical BID   midodrine   30 mg Oral TID   multivitamin  1 tablet Oral Daily   pantoprazole   40 mg Oral BID   ramelteon   8 mg Oral QHS   sodium chloride  flush  3 mL Intravenous Q12H   timolol   1 drop Both Eyes BID    Dialysis Orders:  MWF - [facility] 2hrs77min, BFR 400, DFR Auto 1.5,  EDW 72kg, 2K/ 2.5Ca Heparin  None ordered Mircera 150 mcg q2wks - last 9/29 Calcitriol  0.5mcg PO qHD - last 10/6   Last Labs: Hgb 10.2,  K 4.8, Ca 9.2   Assessment/Plan: Bilateral lower extremity wounds - Dopplers (-) for DVT, on topical corticosteroid, per PCP. Current clinical picture not consistent with infection Sacral osteomyelitis/MDR E. Coli  - Continue ertapenem  with dialysis through 04/29/24 per ID recommendations  ESRD - on HD MWF. Next HD 10/10 Chronic hypotension- Continue high-dose Midodrine  for chronic hypotension Anemia of CKD - Hgb 10.2. Last ESA given 9/29, not due yet. No IV Fe while on ABXs Secondary Hyperparathyroidism -  Contiue home meds. Follow Ca/Phos here.  Afib - on Eliquis   Maisie Ronnald Acosta PA-C [facility] 04/14/2024,10:48 AM

## 2024-04-14 NOTE — Progress Notes (Signed)
 [facility]   ROUNDING   NOTE Chaston Bradburn FMW:[id number]  DOB: 1935-07-29  DOA: 04/11/2024  PCP: Katrinka Garnette KIDD, MD  04/14/2024,2:12 PM  LOS: 2 days    Code Status: Full     from: Home   88 year old white male ESRD MWF-recent complication clotted dialysis access-has right femoral tunneled HD cath 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 with surgery 2011 Anal anastomosis surgery 2012 Ulcerative colitis left-sided abdominal ileostomy colostomy-recent bowel obstruction 01/2024 Paroxysmal A-fib CHADVASC >4 on Eliquis  Chronic hypotension with admission 01/25/2024 severe hypotension midodrine  increased to 30 3 times daily in the setting of known adrenal insufficiency not on hydrocortisone  Sacral osteomyelitis completing prolonged ertapenem  vancomycin   Recent hospitalization -- sepsis related to chronic abdominal abscess cavity with prececal drain getting dislodged showing erythema concerning for potential sepsis in presacral area as source of infection IR placed left gluteal drain initial cultures negative at discharge became positive for multidrug-resistant E. coli-[facility] infectious disease and he was changed to IV ertapenem .  His first dose 9/29  10/7 came into emergency room progressive redness of bilateral lower extremities weeping from leg no fever + chills  Labs on admit Calcium  5.4 BUN/creatinine 41/5.4 alk phos 140 AST/ALT 42/23 WBC 6.4 hemoglobin 11 predominant neutrophilia are 16 Blood cultures obtained   Imaging CXR mild congestive failure right basilar atelectasis effusion-CT ABD pelvis stable right pleural effusion cholelithiasis without complicating factors stable right renal calculi US  lower extremities no DVT   Assessment  & Plan :   VOLUME OVERLOAD LOWER EXTREMITY SWELLING IN THE SETTING OF UNDER DIALYSIS Patient signing off early from dialysis for while accounting for probably lower extremity swelling redness and labs-tells me he has thin skin chronically with  blebs, popping up even earlier today He is already covered with antibiotics as below No further workup He will have to either tolerate some of the cramping associated with dialysis at near end of dialysis or may be go to more frequent dialysis sessions if he is able I will discuss with nephrology ESRD MWF CLOTTED FEMORAL TUNNELED HD CATH Very poor HD access this seems like his last viable alternative Defer to nephrology SEVERE ORTHOSTATIC HYPOTENSION Blood pressures have come up nicely here on midodrine  30 3 times daily which leads me to believe he was probably not compliant with taking it at home as he should have been His daughter reported that he was on apparently 20 twice daily at home See below discussion PERMANENT A-FIB CHADVASC >4 Requires close monitoring-seems to be in sinus rhythm today Continue apixaban  2.5 twice daily for stroke prevention Hypotension limits ability to strictly rate control although he is in the 70 range SACRAL OSTEOMYELITIS MDR E. COLI ON ERTAPENEM  9/29 THROUGH 04/2024 CC Dr. Juli of infectious disease Complete ertapenem  I have reviewed his drain on his sacrum on 10/8 and it seems to be in the left buttock without any erythema PREVIOUS ADRENAL INSUFFICIENCY Free cortisol levels performed are normal range continue current dose of Cortef  PREVIOUS ULCERATIVE COLITIS WITH PRIOR ILEOSTOMY RECENT BOWEL OBSTRUCTION 01/24/2024 Continue chronic ileostomy Continue management  Discussed with daughter [name] 10/9 at bedside  Data Reviewed:   Sodium 136 potassium 4.6 chloride 101 BUN/creatinine 59/6.4 WBC 7.2 hemoglobin 10.6 platelet 126  DVT prophylaxis: Apixaban   Status is: Inpatient Remains inpatient appropriate because: Requires further management     Current Dispo: Inpatient for the next 1 to 2 days     Subjective:    Looks better Up in chair this pm No CP fever No n/v  he does complain of a little bit of bruising His daughter says that the ostomy  bag does not fit all the time Overall relatively unchanged    Objective + exam Vitals:   04/14/24 0130 04/14/24 0317 04/14/24 0400 04/14/24 0855  BP: (!) 73/55 (!) 80/57 (!) 98/59 (!) 81/56  Pulse: 86 72 77 86  Resp: 20 16 14 18   Temp: 97.8 F (36.6 C) 97.6 F (36.4 C)  97.6 F (36.4 C)  TempSrc: Oral Oral  Oral  SpO2: 93% 92% 96% 96%  Weight:      Height:       Filed Weights   04/11/24 2035 04/12/24 2000  Weight: 71.5 kg 76.5 kg     Examination:  EOMI NCAT no focal deficit no icterus Chest is clear no wheeze S1-S2 no murmur Abdomen soft Gluteal drain in place No lower extremity edema  Scheduled Meds:  apixaban   2.5 mg Oral BID   atorvastatin   20 mg Oral Daily   [START ON 04/15/2024] calcitRIOL   0.5 mcg Oral Q M,W,F-HD   Chlorhexidine  Gluconate Cloth  6 each Topical Q0600   vitamin B-12  1,000 mcg Oral Daily   dorzolamide   1 drop Right Eye QHS   fluticasone  furoate-vilanterol  1 puff Inhalation Daily   gabapentin   300 mg Oral QHS   hydrocortisone   10 mg Oral QPM   hydrocortisone   20 mg Oral Daily   hydrocortisone  cream   Topical BID   midodrine   30 mg Oral TID   multivitamin  1 tablet Oral Daily   pantoprazole   40 mg Oral BID   ramelteon   8 mg Oral QHS   sodium chloride  flush  3 mL Intravenous Q12H   timolol   1 drop Both Eyes BID   Continuous Infusions:  ertapenem  Stopped (04/14/24 0231)    Time 60  Jai-Gurmukh Samyukta Cura, MD  [facility]

## 2024-04-14 NOTE — Plan of Care (Signed)
  Problem: Health Behavior/Discharge Planning: Goal: Ability to manage health-related needs will improve 04/14/2024 0302 by Mickiel Cotta, RN Outcome: Progressing 04/14/2024 0302 by Mickiel Cotta, RN Outcome: Progressing   Problem: Clinical Measurements: Goal: Will remain free from infection 04/14/2024 0302 by Mickiel Cotta, RN Outcome: Progressing 04/14/2024 0302 by Mickiel Cotta, RN Outcome: Progressing   Problem: Activity: Goal: Risk for activity intolerance will decrease 04/14/2024 0302 by Mickiel Cotta, RN Outcome: Progressing 04/14/2024 0302 by Mickiel Cotta, RN Outcome: Progressing   Problem: Nutrition: Goal: Adequate nutrition will be maintained Outcome: Progressing   Problem: Coping: Goal: Level of anxiety will decrease Outcome: Progressing   Problem: Pain Managment: Goal: General experience of comfort will improve and/or be controlled Outcome: Progressing   Problem: Safety: Goal: Ability to remain free from injury will improve Outcome: Progressing   Problem: Skin Integrity: Goal: Risk for impaired skin integrity will decrease 04/14/2024 0302 by Mickiel Cotta, RN Outcome: Progressing 04/14/2024 0302 by Mickiel Cotta, RN Outcome: Progressing

## 2024-04-14 NOTE — Progress Notes (Signed)
   04/14/24 0025  Vitals  BP (!) 66/53  MAP (mmHg) (!) 57  Pulse Rate (!) 103  ECG Heart Rate 78  Resp (!) 29  Oxygen Therapy  SpO2 99 %  During Treatment Monitoring  Blood Flow Rate (mL/min) 19 mL/min  Arterial Pressure (mmHg) -109.49 mmHg  Venous Pressure (mmHg) 83.23 mmHg  TMP (mmHg) 9.7 mmHg  Ultrafiltration Rate (mL/min) 0 mL/min  Dialysate Flow Rate (mL/min) 300 ml/min  Dialysate Potassium Concentration 2  Dialysate Calcium  Concentration 2.5  Duration of HD Treatment -hour(s) 1.88 hour(s)  Cumulative Fluid Removed (mL) per Treatment  1091.57  HD Safety Checks Performed Yes  Intra-Hemodialysis Comments Tx completed  Post Treatment  Dialyzer Clearance Lightly streaked  Liters Processed 42.6  Fluid Removed (mL) 1000 mL  Tolerated HD Treatment  (Pt signed early due to cramps.)  AVG/AVF Arterial Site Held (minutes) 10 minutes  AVG/AVF Venous Site Held (minutes) 10 minutes  Fistula / Graft Left Thigh Arteriovenous fistula  Placement Date: (c)    Placed prior to admission: Yes  Orientation: Left  Access Location: Thigh  Access Type: Arteriovenous fistula  Site Condition No complications  Fistula / Graft Assessment Present;Thrill;Bruit  Drainage Description None   Pt request to sign off early due to left foot cramp, Pt request 2hours only.

## 2024-04-14 NOTE — Hospital Course (Signed)
 WOC Nurse Consult Note: Reason for Consult: Needs wound orders for Unna boots looks like may be able to do okay with Aquacel?  Wound type: venous in the presence of recent falls as well; bruising at each knee Partial thickness open ulcers bilateral pretibial area Pressure Injury POA: NA Measurement: see nursing flow sheets Wound bed: ulcers are 100% clean and pink Drainage (amount, consistency, odor)  see nursing flow sheets Periwound:intact, LEs do not appear to be edematous at this time.  Dressing procedure/placement/frequency: Cleanse bilateral LE ulcerations with saline, pat dry. Cover each wound with single layer of xeroform and top with foam. Wrap bilateral LE with kerlix from the toes to the knees and follow with 3 ACE wraps (in the same manner from the base of the toes to the knees.   Change every other day Incidentally noted patient has ostomy as well. Ileostomy since 2011   WOC team with longstanding ileostomy  Stoma type/location: ileostomy since 2011  Treatment options for stomal/peristomal skin: 2 barrier ring  Output see nursing flow sheets Ostomy pouching: 1 piece Soft convex lawson #[id number], barrier ring  Gerlean [phone] Education provided: none, longstanding ostomy.  OK for bedside nurse or patient to change pouch.    If skin denuded,weeping, broken down around stoma crust skin as follows:  Sprinkle stoma powder  Soila #6) over any irritated skin, brush away excess powder Tap lightly over the powder with Cavilon No-Sting barrier wipe (skin barrier wipe-white and blue package) or gloved finger damp with water Allow to dry Apply another layer of powder following the same steps up to three layers.  After dry proceed with routine pouching

## 2024-04-15 ENCOUNTER — Other Ambulatory Visit (HOSPITAL_COMMUNITY): Payer: Self-pay

## 2024-04-15 DIAGNOSIS — R600 Localized edema: Secondary | ICD-10-CM | POA: Diagnosis not present

## 2024-04-15 LAB — BASIC METABOLIC PANEL WITH GFR
Anion gap: 16 — ABNORMAL HIGH (ref 5–15)
BUN: 75 mg/dL — ABNORMAL HIGH (ref 8–23)
CO2: 21 mmol/L — ABNORMAL LOW (ref 22–32)
Calcium: 8.8 mg/dL — ABNORMAL LOW (ref 8.9–10.3)
Chloride: 97 mmol/L — ABNORMAL LOW (ref 98–111)
Creatinine, Ser: 7.82 mg/dL — ABNORMAL HIGH (ref 0.61–1.24)
GFR, Estimated: 6 mL/min — ABNORMAL LOW (ref >60)
Glucose, Bld: 140 mg/dL — ABNORMAL HIGH (ref 70–99)
Potassium: 5.3 mmol/L — ABNORMAL HIGH (ref 3.5–5.1)
Sodium: 134 mmol/L — ABNORMAL LOW (ref 135–145)

## 2024-04-15 LAB — CBC
HCT: 31.4 % — ABNORMAL LOW (ref 39.0–52.0)
Hemoglobin: 10.1 g/dL — ABNORMAL LOW (ref 13.0–17.0)
MCH: 37.5 pg — ABNORMAL HIGH (ref 26.0–34.0)
MCHC: 32.2 g/dL (ref 30.0–36.0)
MCV: 116.7 fL — ABNORMAL HIGH (ref 80.0–100.0)
Platelets: 133 K/uL — ABNORMAL LOW (ref 150–400)
RBC: 2.69 MIL/uL — ABNORMAL LOW (ref 4.22–5.81)
RDW: 17.9 % — ABNORMAL HIGH (ref 11.5–15.5)
WBC: 8.5 K/uL (ref 4.0–10.5)
nRBC: 2 % — ABNORMAL HIGH (ref 0.0–0.2)

## 2024-04-15 LAB — HEPATITIS B SURFACE ANTIGEN: Hepatitis B Surface Ag: NONREACTIVE

## 2024-04-15 MED ORDER — MIDODRINE HCL 10 MG PO TABS
30.0000 mg | ORAL_TABLET | Freq: Three times a day (TID) | ORAL | 1 refills | Status: DC
  Filled 2024-04-15: qty 199, 22d supply, fill #0

## 2024-04-15 MED ORDER — MIDODRINE HCL 5 MG PO TABS
ORAL_TABLET | ORAL | Status: AC
  Filled 2024-04-15: qty 6

## 2024-04-15 MED ORDER — HYDROCORTISONE 20 MG PO TABS
20.0000 mg | ORAL_TABLET | Freq: Every day | ORAL | 2 refills | Status: DC
  Filled 2024-04-15: qty 30, 30d supply, fill #0

## 2024-04-15 MED ORDER — SODIUM CHLORIDE 0.9 % IV SOLN: 1.0000 g | INTRAVENOUS | Status: DC

## 2024-04-15 MED ORDER — HYDROCORTISONE 10 MG PO TABS
10.0000 mg | ORAL_TABLET | Freq: Every evening | ORAL | 2 refills | Status: AC
Start: 2024-04-15 — End: ?
  Filled 2024-04-15: qty 30, 30d supply, fill #0

## 2024-04-15 MED ORDER — CALCITRIOL 0.25 MCG PO CAPS
ORAL_CAPSULE | ORAL | Status: AC
  Filled 2024-04-15: qty 2

## 2024-04-15 NOTE — Procedures (Signed)
 HD Note:  Some information was entered later than the data was gathered due to patient care needs. The stated time with the data is accurate.  Received patient in bed to unit.   Alert and oriented.  Patient stated he was impatient to be done prior to starting treatment.  Informed consent signed and in chart.   Access used: Left femoral graft Access issues: None  Patient had cramping in both legs during treatment.  UF was paused to accommodate cramping until resolved..  TX duration: 3.5 hours  Alert, without acute distress physically.  Patient stated everything took to long.  Total UF removed: 900 ml  Hand-off given to patient's nurse.   Transported back to the room   Mckenzy Salazar L. Lenon, RN [facility] D[facility] Unit.

## 2024-04-15 NOTE — TOC Transition Note (Signed)
 Transition of Care [facility]) - Discharge Note   Patient Details  Name: Joshua Riley MRN: [id number] Date of Birth: Aug 19, 1935  Transition of Care [facility] CM/SW Contact:  Corean JAYSON Canary, RN Phone Number: 04/15/2024, 10:24 AM   Clinical Narrative:     Patient to be dc home later today Called [facility] to confirm that he is active. Active for PT OT and RN. Orders in encouraged special attention to medication management for [facility] RN No other needs identified   Final next level of care: Home w Home Health Services Barriers to Discharge: No Barriers Identified   Patient Goals and CMS Choice            Discharge Placement                       Discharge Plan and Services Additional resources added to the After Visit Summary for     Discharge Planning Services: CM Consult                      HH Arranged: RN, PT, OT [facility] Agency: [facility] Date [facility] Agency Contacted: 04/15/24 Time HH Agency Contacted: 1004 Representative spoke with at [facility] Agency: Active with [facility]  Social Drivers of Health (SDOH) Interventions SDOH Screenings   Food Insecurity: No Food Insecurity (04/13/2024)  Housing: Low Risk  (04/13/2024)  Transportation Needs: No Transportation Needs (04/13/2024)  Utilities: Not At Risk (04/13/2024)  Alcohol Screen: Low Risk  (10/26/2023)  Depression (PHQ2-9): Medium Risk (04/07/2024)  Financial Resource Strain: Low Risk  (10/26/2023)  Physical Activity: Insufficiently Active (10/26/2023)  Social Connections: Patient Declined (04/13/2024)  Recent Concern: Social Connections - Socially Isolated (01/27/2024)  Stress: Stress Concern Present (10/26/2023)  Tobacco Use: Medium Risk (04/12/2024)     Readmission Risk Interventions    12/03/2023   11:30 AM 11/10/2023    3:46 PM 07/21/2023   12:26 PM  Readmission Risk Prevention Plan  Transportation Screening Complete Complete Complete  PCP or Specialist Appt within 5-7 Days  Complete   Home Care  Screening  Complete   Medication Review (RN CM)  Complete   HRI or Home Care Consult   Complete  Social Work Consult for Recovery Care Planning/Counseling   Complete  Palliative Care Screening   Not Applicable  Medication Review Oceanographer)   Complete  SW Recovery Care/Counseling Consult Complete    Palliative Care Screening Complete    Skilled Nursing Facility Complete

## 2024-04-15 NOTE — Progress Notes (Signed)
 Pt UF turned off d/t pt cramping and reduction in BP.

## 2024-04-15 NOTE — Plan of Care (Signed)
  Problem: Health Behavior/Discharge Planning: Goal: Ability to manage health-related needs will improve Outcome: Progressing   Problem: Clinical Measurements: Goal: Will remain free from infection Outcome: Progressing   Problem: Activity: Goal: Risk for activity intolerance will decrease Outcome: Progressing   Problem: Coping: Goal: Level of anxiety will decrease Outcome: Progressing   Problem: Pain Managment: Goal: General experience of comfort will improve and/or be controlled Outcome: Progressing   Problem: Safety: Goal: Ability to remain free from injury will improve Outcome: Progressing   Problem: Skin Integrity: Goal: Risk for impaired skin integrity will decrease Outcome: Progressing

## 2024-04-15 NOTE — Discharge Summary (Signed)
 Physician Discharge Summary  Joshua Riley FMW:[id number] DOB: 1936/03/14 DOA: 04/11/2024  PCP: Katrinka Garnette KIDD, MD  Admit date: 04/11/2024 Discharge date: 04/15/2024  Time spent: 35 minutes  Recommendations for Outpatient Follow-up:  Cbc/renal panel in about a week Recommend close monitoring of supra gluteal drain and removal of drain based on infectious disease input at EOT and keep appointment with Dr. Juli 05/12/2024 I will CC her PT OT RN requested to come in and see patient-RN to manage medications as he was prior not always compliant on his 3 times daily meds He will need wound checks as per instructions below on his lower extremities Nephrologist as an outpatient to follow him and determine frequency of HD-he may need to go to 4 times a week instead of 3 times a week as he typically under dialyzes and came in with volume overload >any infection of lower extremity  Discharge Diagnoses:  MAIN problem for hospitalization   Volume overload  Please see below for itemized issues addressed in HOpsital- refer to other progress notes for clarity if needed  Discharge Condition: Improved  Diet recommendation: Heart healthy fluid restrict  Filed Weights   04/11/24 2035 04/12/24 2000  Weight: 71.5 kg 76.5 kg    History of present illness:  88 year old white male ESRD MWF-recent complication clotted dialysis access-has right femoral tunneled HD cath 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 with surgery 2011 Anal anastomosis surgery 2012 Ulcerative colitis left-sided abdominal ileostomy colostomy-recent bowel obstruction 01/2024 Paroxysmal A-fib CHADVASC >4 on Eliquis  Chronic hypotension with admission 01/25/2024 severe hypotension midodrine  increased to 30 3 times daily in the setting of known adrenal insufficiency not on hydrocortisone  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 coli-[facility] infectious disease and he was changed to IV ertapenem .  His first dose 9/29   10/7 came into emergency room progressive redness of bilateral lower extremities weeping from leg no fever + chills   Labs on admit Calcium  5.4 BUN/creatinine 41/5.4 alk phos 140 AST/ALT 42/23 WBC 6.4 hemoglobin 11 predominant neutrophilia are 16 Blood cultures obtained     Imaging CXR mild congestive failure right basilar atelectasis effusion-CT ABD pelvis stable right pleural effusion cholelithiasis without complicating factors stable right renal calculi US  lower extremities no DVT    Assessment  & Plan :    VOLUME OVERLOAD LOWER EXTREMITY SWELLING IN THE SETTING OF UNDER DIALYSIS Patient signing off early from dialysis for while accounting for probably lower extremity swelling redness and labs-tells me he has thin skin chronically with blebs, popping up even earlier today He is already covered with antibiotics as below No further workup He will have to either tolerate some of the cramping associated with dialysis at near end of dialysis or may be go to more frequent dialysis sessions if he is able--- I have asked that nephrology follow him closely in the outpatient setting and have these discussions CC Dr. Gearline at discharge to ensure that these conversations are held ESRD MWF CLOTTED FEMORAL TUNNELED HD CATH Very poor HD access this seems like his last viable alternative Defer to nephrology He will eventually need either discussion regarding other procedures that may be performed or realistic discussion about goals of care SEVERE ORTHOSTATIC HYPOTENSION Blood pressures have come up nicely  here on midodrine  30 3 times daily which leads me to believe  he was probably not compliant with taking it at home as he should have been His daughter reported that he was on apparently 20 twice daily at home-it is not completely clear what he was taking at home so because of this I have asked home health RN to come and make sure he has medication management planned and ensure that he is taking his midodrine  3 times daily See below discussion PERMANENT A-FIB CHADVASC >4 Requires close monitoring-seems to be in sinus rhythm today Continue apixaban  2.5 twice daily for stroke prevention Hypotension limits ability to strictly rate control although during our hospital stay he was pretty much rate controlled SACRAL OSTEOMYELITIS MDR E. COLI ON ERTAPENEM  9/29 THROUGH 04/2024 CC Dr. Juli of infectious disease Complete ertapenem  I have reviewed his drain on his sacrum on 10/8 and it seems to be in the left buttock without any erythema PREVIOUS ADRENAL INSUFFICIENCY Free cortisol levels performed are normal range continue current dose of Cortef  PREVIOUS ULCERATIVE COLITIS WITH PRIOR ILEOSTOMY RECENT BOWEL OBSTRUCTION 01/24/2024 Continue chronic ileostomy Continue management from this morning he was a little she  Discharge Exam: Vitals:   04/15/24 0500 04/15/24 0700  BP: 94/65 91/63  Pulse: 69   Resp: 19   Temp: 97.6 F (36.4 C) 97.7 F (36.5 C)  SpO2: 95%     Subj on day of d/c   Awake coherent eating fruit at the bedside looks comfortable A little bit unsteady while walking but walked to the door and back with a leftward lean He has no significant pain nausea and states that his legs look better feel better less swollen  General Exam on discharge  EOMI NCAT frail slightly cachectic white male no distress S1-S2 no murmur Chest is clear Abdomen is soft Drain reviewed and left upper gluteal region looks like it is in place with 1 suture Both lower extremities have Kerlix as I did not examine wounds  Discharge Instructions   Discharge  Instructions     Diet - low sodium heart healthy   Complete by: As directed    Discharge instructions   Complete by: As directed    Please make sure that you take all your medications carefully we will get a home health nurse to come out and ensure that you are taking your midodrine  as you are supposed to as your blood pressures look so much better while you are supervised taking your midodrine  as well as your steroid pills Be careful moving around-we have asked that home health therapy come out to your home in addition to the personal care services that you get-it would be a good idea for you to follow-up closely with Dr. Juli The nurse should be able to change your wounds dressings on your lower extremities every 5 days and make sure that we place some dressings etc. and they will keep a watch on things I anticipate that you will find that your dialysis will go better if you take the medications as we have recommended for you Should you have high-grade fevers chills nausea vomiting present back to the emergency room Otherwise keep your appointment with Dr. Montie Juli who I have CCed on this discharge   Discharge wound care:   Complete by: As directed    Cleanse bilateral LE ulcerations with saline, pat dry. Cover each wound with single layer of xeroform and top with foam. Wrap bilateral LE with kerlix from the toes  to the knees and follow with 3 ACE wraps (in the same manner from the base of the toes to the knees.   Increase activity slowly   Complete by: As directed       Allergies as of 04/15/2024       Reactions   Baclofen Other (See Comments)   Altered mental status, after accidental overdose   Cephalosporins Rash   Ciprofloxacin Itching, Rash   Wound Dressing Adhesive Rash        Medication List     STOP taking these medications    SLEEP AID PO       TAKE these medications    atorvastatin  20 MG tablet Commonly known as: LIPITOR Take 1 tablet (20 mg total)  by mouth daily.   calcitRIOL  0.5 MCG capsule Commonly known as: ROCALTROL  Take 1 capsule (0.5 mcg total) by mouth every Monday, Wednesday, and Friday with hemodialysis.   dorzolamide  2 % ophthalmic solution Commonly known as: TRUSOPT  Place 1 drop into the right eye at bedtime.   Eliquis  2.5 MG Tabs tablet Generic drug: apixaban  Take 1 tablet (2.5 mg total) by mouth 2 (two) times daily.   ertapenem  1 g in sodium chloride  0.9 % 100 mL Inject 1 g into the vein every Monday, Wednesday, and Friday at 6 PM.   folic acid  1 MG tablet Commonly known as: FOLVITE  Take 1 tablet (1 mg total) by mouth daily.   gabapentin  300 MG capsule Commonly known as: NEURONTIN  TAKE 1 CAPSULE BY MOUTH ONCE A DAY AT BEDTIME FOR NEUROPATHY   hydrocortisone  10 MG tablet Commonly known as: CORTEF  Take 2 tablets (20 mg total) by mouth daily and take 1 tablet (10 mg total) by mouth every evening.   hydrocortisone  20 MG tablet Commonly known as: CORTEF  Take 1 tablet (20 mg total) by mouth daily.   midodrine  10 MG tablet Commonly known as: PROAMATINE  Take 3 tablets (30 mg total) by mouth 3 (three) times daily.   mometasone -formoterol  200-5 MCG/ACT Aero Commonly known as: DULERA  Inhale 2 puffs into the lungs 2 (two) times daily.   multivitamin Tabs tablet Take 1 tablet by mouth daily.   Omega-3 Gummies Chew Chew 1,200 mg by mouth in the morning and at bedtime.   pantoprazole  40 MG tablet Commonly known as: PROTONIX  Take 1 tablet (40 mg total) by mouth 2 (two) times daily.   ramelteon  8 MG tablet Commonly known as: ROZEREM  Take 1 tablet (8 mg total) by mouth at bedtime.   Saline Flush 0.9 % Soln Use 5 mLs by Intracatheter route daily as directed.   timolol  0.5 % ophthalmic solution Commonly known as: TIMOPTIC  Place 1 drop into both eyes in the morning and at bedtime.   traMADol  50 MG tablet Commonly known as: ULTRAM  Take 1 tablet (50 mg total) by mouth every 12 (twelve) hours as needed (for  pain).   Tylenol  325 MG tablet Generic drug: acetaminophen  Take 325-650 mg by mouth every 6 (six) hours as needed for mild pain (pain score 1-3) or headache.   VITAMIN B-12 PO Take 1 capsule by mouth in the morning and at bedtime.               Discharge Care Instructions  (From admission, onward)           Start     Ordered   04/15/24 0000  Discharge wound care:       Comments: Cleanse bilateral LE ulcerations with saline, pat dry. Cover each wound  with single layer of xeroform and top with foam. Wrap bilateral LE with kerlix from the toes to the knees and follow with 3 ACE wraps (in the same manner from the base of the toes to the knees.   04/15/24 1117           Allergies  Allergen Reactions   Baclofen Other (See Comments)    Altered mental status, after accidental overdose     Cephalosporins Rash   Ciprofloxacin Itching and Rash   Wound Dressing Adhesive Rash      The results of significant diagnostics from this hospitalization (including imaging, microbiology, ancillary and laboratory) are listed below for reference.    Significant Diagnostic Studies: US  Venous Img Lower Bilateral Result Date: 04/12/2024 CLINICAL DATA:  Bilateral leg swelling EXAM: BILATERAL LOWER EXTREMITY VENOUS DOPPLER ULTRASOUND TECHNIQUE: Gray-scale sonography with graded compression, as well as color Doppler and duplex ultrasound were performed to evaluate the lower extremity deep venous systems from the level of the common femoral vein and including the common femoral, femoral, profunda femoral, popliteal and calf veins including the posterior tibial, peroneal and gastrocnemius veins when visible. The superficial great saphenous vein was also interrogated. Spectral Doppler was utilized to evaluate flow at rest and with distal augmentation maneuvers in the common femoral, femoral and popliteal veins. COMPARISON:  None Available. FINDINGS: RIGHT LOWER EXTREMITY Common Femoral Vein: No  evidence of thrombus. Normal compressibility, respiratory phasicity and response to augmentation. Saphenofemoral Junction: No evidence of thrombus. Normal compressibility and flow on color Doppler imaging. Profunda Femoral Vein: No evidence of thrombus. Normal compressibility and flow on color Doppler imaging. Femoral Vein: No evidence of thrombus. Normal compressibility, respiratory phasicity and response to augmentation. Popliteal Vein: No evidence of thrombus. Normal compressibility, respiratory phasicity and response to augmentation. Calf Veins: No evidence of thrombus. Normal compressibility and flow on color Doppler imaging. Superficial Great Saphenous Vein: No evidence of thrombus. Normal compressibility and flow on color Doppler imaging. Venous Reflux:  None. Other Findings:  Mild edema is noted. LEFT LOWER EXTREMITY Common Femoral Vein: No evidence of thrombus. Normal compressibility, respiratory phasicity and response to augmentation. Saphenofemoral Junction: No evidence of thrombus. Normal compressibility and flow on color Doppler imaging. Profunda Femoral Vein: No evidence of thrombus. Normal compressibility and flow on color Doppler imaging. Femoral Vein: No evidence of thrombus. Normal compressibility, respiratory phasicity and response to augmentation. Popliteal Vein: No evidence of thrombus. Normal compressibility, respiratory phasicity and response to augmentation. Calf Veins: No evidence of thrombus. Normal compressibility and flow on color Doppler imaging. Superficial Great Saphenous Vein: No evidence of thrombus. Normal compressibility and flow on color Doppler imaging. Venous Reflux:  None. Other Findings:  Mild edema is noted. IMPRESSION: No evidence of deep venous thrombosis. Electronically Signed   By: Oneil Devonshire M.D.   On: 04/12/2024 00:55   CT ABDOMEN PELVIS WO CONTRAST Result Date: 04/11/2024 CLINICAL DATA:  Recent fall and findings suspicious for sepsis EXAM: CT ABDOMEN AND PELVIS  WITHOUT CONTRAST TECHNIQUE: Multidetector CT imaging of the abdomen and pelvis was performed following the standard protocol without IV contrast. RADIATION DOSE REDUCTION: This exam was performed according to the departmental dose-optimization program which includes automated exposure control, adjustment of the mA and/or kV according to patient size and/or use of iterative reconstruction technique. COMPARISON:  04/04/2024 FINDINGS: Lower chest: Large right-sided pleural effusion is again noted. Vascular congestion is again seen. Hepatobiliary: Cholelithiasis without complicating factors. The liver is within normal limits. Pancreas: Unremarkable. No pancreatic ductal dilatation or  surrounding inflammatory changes. Spleen: Normal in size without focal abnormality. Adrenals/Urinary Tract: Adrenal glands are within normal limits. Kidneys are atrophic consistent with the known end-stage renal disease. Scattered cysts are noted bilaterally. No follow-up is recommended. Nonobstructing stone is seen in the lower pole of the right kidney. Left-sided nephrostomy catheter is noted and stable. No ureteral stones are seen. The bladder is decompressed. Stomach/Bowel: There are changes consistent with right-sided ileostomy and prior total colectomy. These are stable in appearance. No small bowel obstructive changes are seen. The stomach is within normal limits. Drainage catheter is seen in the presacral region. Vascular/Lymphatic: Aortic atherosclerosis. No enlarged abdominal or pelvic lymph nodes. Left AV graft is noted. Reproductive: Prostate is unremarkable.  Penile prosthesis is noted. Other: No abdominal wall hernia or abnormality. No abdominopelvic ascites. Musculoskeletal: L5 pars defects are noted with grade 1 anterolisthesis. IMPRESSION: Stable right-sided pleural effusion. Cholelithiasis without complicating factors. Stable right renal calculi.  Stable renal atrophy. Electronically Signed   By: Oneil Devonshire M.D.   On:  04/11/2024 23:08   DG Chest 2 View Result Date: 04/11/2024 CLINICAL DATA:  Follow-up pleural effusion EXAM: CHEST - 2 VIEW COMPARISON:  03/14/2024 FINDINGS: Cardiac shadow is stable. Mild vascular congestion is again noted. Small right pleural effusion is seen similar to that noted on the prior exam. No new focal infiltrate is seen. Right subclavian vein stent is noted. IMPRESSION: Mild vascular congestion with stable small right pleural effusion. Electronically Signed   By: Oneil Devonshire M.D.   On: 04/11/2024 21:36   DG Chest Port 1 View Result Date: 04/11/2024 CLINICAL DATA:  Possible sepsis EXAM: PORTABLE CHEST 1 VIEW COMPARISON:  04/07/2024 FINDINGS: Cardiac shadow is prominent but accentuated by the portable technique. Thoracic aorta is tortuous. Right subclavian vein stent is noted. Mild central vascular congestion is noted with interstitial edema slightly improved when compared with the prior exam. Small right-sided pleural effusion is noted. Basilar atelectasis is seen as well on the right. IMPRESSION: Mild congestive failure. Right basilar atelectasis and associated effusion. Electronically Signed   By: Oneil Devonshire M.D.   On: 04/11/2024 21:35   DG Sinus/Fist Tube Chk-Non GI Result Date: 04/04/2024 CLINICAL DATA:  Patient with a history of chronic presacral abscess with most recent drain placement in IR 03/16/2024. EXAM: ABSCESS INJECTION COMPARISON:  None Available. CONTRAST:  7 ml Isovue  300 - administered via the existing percutaneous drain. FLUOROSCOPY TIME:  Radiation exposure index as provided by the fluoroscopic device: 4.8 mGy kerma TECHNIQUE: The patient was positioned prone on the fluoroscopy table. A pre-procedural spot fluoroscopic image was obtained of the presacral/pelvic abscess and the existing percutaneous drainage catheter. Multiple spot fluoroscopic and radiographic images were obtained following the injection of a small amount of contrast via the existing percutaneous drainage  catheter. FINDINGS: Contrast injection showed a possible fistula to the bowel. IMPRESSION: Possible fistula to the bowel and the drain will be left in place. The patient will return to the clinic in approximately 3 weeks for repeat CT scan and drain injection. Electronically Signed   By: Ester Sides M.D.   On: 04/04/2024 15:23   CT ABDOMEN PELVIS W CONTRAST Result Date: 04/04/2024 CLINICAL DATA:  88 year old male with history of recurrent presacral abscess status post drain placement on 03/16/2024. EXAM: CT ABDOMEN AND PELVIS WITH CONTRAST TECHNIQUE: Multidetector CT imaging of the abdomen and pelvis was performed using the standard protocol following bolus administration of intravenous contrast. RADIATION DOSE REDUCTION: This exam was performed according to the departmental  dose-optimization program which includes automated exposure control, adjustment of the mA and/or kV according to patient size and/or use of iterative reconstruction technique. CONTRAST:  ISOVUE -300 IOPAMIDOL  (ISOVUE -300) INJECTION 61% COMPARISON:  03/16/2024, 03/14/2024 FINDINGS: Lower chest: Similar appearing small right pleural effusion with associated right basilar passive atelectasis. The heart is normal in size without significant pericardial effusion. Hepatobiliary: No focal liver abnormality is seen. Unchanged cholelithiasis without inflammatory changes. No biliary dilatation. Pancreas: Unremarkable. No pancreatic ductal dilatation or surrounding inflammatory changes. Spleen: Normal in size without focal abnormality. Adrenals/Urinary Tract: Adrenal glands are unremarkable. Well-positioned in unchanged appearance of indwelling left percutaneous nephrostomy tube. Kidneys are chronically atrophic, without renal calculi, focal lesion, or hydronephrosis. Bladder is unremarkable. Stomach/Bowel: Stomach is within normal limits. 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 No  evidence of bowel wall thickening, distention, or inflammatory changes. Vascular/Lymphatic: Aortic atherosclerosis. Partially visualized left femoral arteriovenous graft which appears patent. No enlarged abdominal or pelvic lymph nodes. Reproductive: Prostate is unremarkable. Penile prosthetic pump partially visualized. Other: Unchanged position of indwelling left transgluteal pigtail drainage catheter with trace residual gas containing fluid collection just superior to the pigtail portion of the catheter. No new fluid collections or ascites. Musculoskeletal: No acute osseous abnormality. Unchanged bilateral L5 pars defects with unchanged mild anterolisthesis of L5 on S1. IMPRESSION: 1. Unchanged position of indwelling left transgluteal pigtail drainage catheter with trace residual gas containing fluid collection just superior to the pigtail portion of the catheter. No new fluid collections or ascites. 2. Well-positioned and unchanged appearance of indwelling left percutaneous nephrostomy tube. 3. Similar appearing small right pleural effusion with associated right basilar passive atelectasis. 4. Cholelithiasis without evidence of cholecystitis. 5.  Aortic Atherosclerosis (ICD10-I70.0). Ester Sides, MD [facility] Electronically Signed   By: Ester Sides M.D.   On: 04/04/2024 14:24   IR Radiologist Eval & Mgmt Result Date: 04/04/2024 EXAM: See EPIC note CHIEF COMPLAINT: See EPIC note HISTORY OF PRESENT ILLNESS: See EPIC note REVIEW OF SYSTEMS: See EPIC note PHYSICAL EXAMINATION: See EPIC note ASSESSMENT AND PLAN: See EPIC note Electronically Signed   By: Ester Sides M.D.   On: 04/04/2024 13:57   CT GUIDED PERITONEAL/RETROPERITONEAL FLUID DRAIN BY PERC CATH Result Date: 03/16/2024 CLINICAL DATA:  Chronic presacral abscess drain infrarenal E dislodged, with residual/recurrent fluid noted presacral on recent CT, drain replacement requested EXAM: CT  GUIDED DRAINAGE OF PRESACRAL ABSCESS ANESTHESIA/SEDATION: Intravenous Fentanyl  50mcg and Versed  1mg  were administered by RN during a total moderate (conscious) sedation time of 15 minutes; the patient's level of consciousness and physiological / cardiorespiratory status were monitored continuously by radiology RN under my direct supervision. PROCEDURE: The procedure, risks, benefits, and alternatives were explained to the patient. Questions regarding the procedure were encouraged and answered. The patient understands and consents to the procedure. patient placed prone. select axial scans through the pelvis obtained. An appropriate skin entry site was determined and marked. The operative field was prepped with chlorhexidinein a sterile fashion, and a sterile drape was applied covering the operative field. A sterile gown and sterile gloves were used for the procedure. Local anesthesia was provided with 1% Lidocaine . In under CT fluoroscopic guidance, 18 gauge trocar needle advanced into the presacral fluid collection. Amplatz guidewire looped in the collection, confirmed on CT. Tract dilated to facilitate advancement of 10 French pigtail drain catheter. Approximately 7 mL of thin purulent appearing material could be aspirated. Sample sent for Gram stain and  culture. The catheter was secured externally with 0 Prolene suture and placed to gravity drain bag. CT confirms good drain catheter position. The patient tolerated the procedure well. RADIATION DOSE REDUCTION: This exam was performed according to the departmental dose-optimization program which includes automated exposure control, adjustment of the mA and/or kV according to patient size and/or use of iterative reconstruction technique. COMPLICATIONS: None immediate FINDINGS: Small recurrent presacral fluid collection. 10 French pigtail drain catheter placed under CT guidance as above. Small volume thin purulent material aspirated, sent for Gram stain and culture.  IMPRESSION: 1. Technically successful CT-guided presacral abscess drain catheter placement. Electronically Signed   By: JONETTA Faes M.D.   On: 03/16/2024 17:31    Microbiology: Recent Results (from the past 240 hours)  Blood Culture (routine x 2)     Status: None (Preliminary result)   Collection Time: 04/11/24 10:12 PM   Specimen: BLOOD  Result Value Ref Range Status   Specimen Description   Final    BLOOD LEFT ANTECUBITAL Performed at [facility], [address] [place], [place] [address]    Special Requests   Final    BOTTLES DRAWN AEROBIC AND ANAEROBIC Blood Culture adequate volume Performed at [facility], [address], [place], [place] [address]    Culture   Final    NO GROWTH 3 DAYS Performed at [facility], [address] [address], [place], [place] [address]    Report Status PENDING  Incomplete  Blood Culture (routine x 2)     Status: None (Preliminary result)   Collection Time: 04/12/24  8:30 PM   Specimen: BLOOD RIGHT ARM  Result Value Ref Range Status   Specimen Description BLOOD RIGHT ARM  Final   Special Requests   Final    BOTTLES DRAWN AEROBIC AND ANAEROBIC Blood Culture adequate volume   Culture   Final    NO GROWTH 3 DAYS Performed at [facility], [address], [place], [place] [address]    Report Status PENDING  Incomplete     Labs: Basic Metabolic Panel: Recent Labs  Lab 04/11/24 2110 04/12/24 1337 04/13/24 0243 04/14/24 1127 04/15/24 0213  NA 141 139 140 136 134*  K 5.4* 5.1 4.8 4.6 5.3*  CL 102 101 103 101 97*  CO2 21* 23 22 17* 21*  GLUCOSE 115* 166* 110* 83 140*  BUN 41* 48* 60* 59* 75*  CREATININE 5.49* 6.09* 7.28* 6.45* 7.82*  CALCIUM  10.0 9.3 9.2 8.7* 8.8*   Liver Function Tests: Recent Labs  Lab 04/11/24 2110  AST 42*  ALT 23  ALKPHOS 140*  BILITOT 0.5  PROT 6.7  ALBUMIN  4.2   No results for input(s): LIPASE, AMYLASE in the last 168 hours. No results for input(s):  AMMONIA in the last 168 hours. CBC: Recent Labs  Lab 04/11/24 2110 04/13/24 0243 04/14/24 1127 04/15/24 0213  WBC 6.4 6.9 7.2 8.5  NEUTROABS 4.9  --   --   --   HGB 11.1* 10.2* 10.6* 10.1*  HCT 34.4* 32.1* 32.9* 31.4*  MCV 115.4* 116.3* 116.7* 116.7*  PLT 191 155 127* 133*   Cardiac Enzymes: No results for input(s): CKTOTAL, CKMB, CKMBINDEX, TROPONINI in the last 168 hours. BNP: BNP (last 3 results) No results for input(s): BNP in the last 8760 hours.  ProBNP (last 3 results) No results for input(s): PROBNP in the last 8760 hours.  CBG: No results for input(s): GLUCAP in the last 168 hours.  Signed:  Jai-Gurmukh Malarie Tappen MD  [facility] 04/15/2024, 11:18 AM

## 2024-04-15 NOTE — Progress Notes (Signed)
 Physical Therapy Quick Note  PT has completed initial evaluation.    Overall, patient at supervision assistance level.   PT Follow up recommended: Home Health PT Equipment recommended:  None recommended Complete evaluation note to follow.     Arlyss Gandy, PT, DPT Acute Rehabilitation Office [phone]

## 2024-04-15 NOTE — Progress Notes (Signed)
 [facility] Progress Note   Subjective:  Seen in room. Says he's tired otherwise no complaints For dialysis today    Objective Vitals:   04/14/24 2320 04/15/24 0000 04/15/24 0500 04/15/24 0700  BP: (!) 89/57 101/62 94/65 91/63   Pulse: 72 69 69   Resp: 18 19 19    Temp: (!) 97.4 F (36.3 C)  97.6 F (36.4 C) 97.7 F (36.5 C)  TempSrc: Oral  Oral Oral  SpO2: 93%  95%   Weight:      Height:         Additional Objective Labs: Basic Metabolic Panel: Recent Labs  Lab 04/13/24 0243 04/14/24 1127 04/15/24 0213  NA 140 136 134*  K 4.8 4.6 5.3*  CL 103 101 97*  CO2 22 17* 21*  GLUCOSE 110* 83 140*  BUN 60* 59* 75*  CREATININE 7.28* 6.45* 7.82*  CALCIUM  9.2 8.7* 8.8*   CBC: Recent Labs  Lab 04/11/24 2110 04/13/24 0243 04/14/24 1127 04/15/24 0213  WBC 6.4 6.9 7.2 8.5  NEUTROABS 4.9  --   --   --   HGB 11.1* 10.2* 10.6* 10.1*  HCT 34.4* 32.1* 32.9* 31.4*  MCV 115.4* 116.3* 116.7* 116.7*  PLT 191 155 127* 133*   Blood Culture    Component Value Date/Time   SDES BLOOD RIGHT ARM 04/12/2024 2030   SPECREQUEST  04/12/2024 2030    BOTTLES DRAWN AEROBIC AND ANAEROBIC Blood Culture adequate volume   CULT  04/12/2024 2030    NO GROWTH 3 DAYS Performed at [facility], [address], [place], [place] [address]    REPTSTATUS PENDING 04/12/2024 2030     Physical Exam General: Elderly man, nad  Heart: RRR Resp: Normal wob Abdomen: non tender Extremities: Bilat LE bandaged  Dialysis Access:  L thigh AVG +bruit   Medications:  ertapenem  Stopped (04/14/24 0231)    apixaban   2.5 mg Oral BID   atorvastatin   20 mg Oral Daily   calcitRIOL   0.5 mcg Oral Q M,W,F-HD   Chlorhexidine  Gluconate Cloth  6 each Topical Q0600   vitamin B-12  1,000 mcg Oral Daily   dorzolamide   1 drop Right Eye QHS   fluticasone  furoate-vilanterol  1 puff Inhalation Daily   gabapentin   300 mg Oral QHS   hydrocortisone   10 mg Oral QPM   hydrocortisone   20 mg Oral  Daily   hydrocortisone  cream   Topical BID   midodrine   30 mg Oral TID   multivitamin  1 tablet Oral Daily   pantoprazole   40 mg Oral BID   ramelteon   8 mg Oral QHS   sodium chloride  flush  3 mL Intravenous Q12H   timolol   1 drop Both Eyes BID    Dialysis Orders:  MWF - [facility] 2hrs93min, BFR 400, DFR Auto 1.5,  EDW 72kg, 2K/ 2.5Ca Heparin  None ordered Mircera 150 mcg q2wks - last 9/29 Calcitriol  0.5mcg PO qHD - last 10/6   Last Labs: Hgb 10.2,  K 4.8, Ca 9.2   Assessment/Plan: Bilateral lower extremity wounds - Dopplers (-) for DVT, on topical corticosteroid, per PCP. Current clinical picture not consistent with infection Sacral osteomyelitis/MDR E. Coli  - Continue ertapenem  with dialysis through 04/29/24 per ID recommendations  ESRD - on HD MWF. Next HD 10/10 Chronic hypotension- Continue high-dose Midodrine  for chronic hypotension Anemia of CKD - Hgb 10.2. Last ESA given 9/29, not due yet. No IV Fe while on ABXs Secondary Hyperparathyroidism -  Contiue home meds. Follow  Ca/Phos here.  Afib - on Eliquis      Joshua Ronnald Acosta PA-C [facility] 04/15/2024,9:18 AM

## 2024-04-15 NOTE — Progress Notes (Signed)
 Discussed with HD RN and nephrologist if pt could d/c to out-pt HD clinic to avoid dialysis on day of discharge, unfortunately HD clinic did not have any chairs today or tomorrow. Will continue to assist.   Joshua Riley Dialysis Nav [phone]

## 2024-04-15 NOTE — Plan of Care (Signed)
 [facility] Patient Discharge Orders- [facility] CLINIC: [place]  Patient's name: Joshua Riley Admit/DC Dates: 04/11/2024 - 04/15/2024  Discharge Diagnoses: Bilateral LE swelling/volume overload  H/o sacral osteomyelitis. Continue ertapenem  with HD.    Outpatient Dialysis Orders:  -Heparin : No change  -EDW: No change   -Bath: No change   Anemia Aranesp : Given: -- Date of last dose/amount:   -- PRBC's Given: -- Date/# of units: -- ESA dose for discharge: per protocol   Recent Labs  Lab 04/11/24 2110 04/12/24 1337 04/15/24 0213  HGB 11.1*   < > 10.1*  K 5.4*   < > 5.3*  CALCIUM  10.0   < > 8.8*  ALBUMIN  4.2  --   --    < > = values in this interval not displayed.   Access intervention/Change: no change. Used L thigh AVG here.    Medications: -IV Antibiotics: Continue ertapenem  1 g q HD until 04/29/24 for osteomyelitis    OTHER/APPTS/LABS    Completed by: Maisie Ronnald Acosta PA-C   D/C Meds to be reconciled by nurse after every discharge.    Reviewed by: MD:______ RN_______

## 2024-04-15 NOTE — Plan of Care (Signed)

## 2024-04-15 NOTE — Progress Notes (Signed)
 Called hemodialysis at 1200 to find out about delay.  HD nurse stated they would not be able to to pt down until after lunch. Transport arrived after 1400 to take pt down. Stated pt would be there for approximately 3 hours.

## 2024-04-15 NOTE — Evaluation (Signed)
 Physical Therapy Evaluation Patient Details Name: Joshua Riley MRN: [id number] DOB: 12-May-1936 Today's Date: 04/15/2024  History of Present Illness  88 y.o. male presents to [facility] hospital on 04/11/2024 with BLE edema, admitted for volume overload. PMH - ESRD on HD, afib, CVA, depression, colectomy, ileostomy, chronic hypotension, DVT, acute pericarditis, groin abscess with lt transgluteal drain, nephrostomy tubes, chf, copd.  Clinical Impression  Pt presents to PT with deficits in strength, power, balance, endurance. Pt is able to transfer and ambulate for short household distances with support of a RW. Pt reports feeling most limited by continued pain in BLE from recent edema. Pt will benefit from continued frequent mobilization in an effort to improve activity tolerance and strength. PT recommends HHPT at the time of discharge. Pt reports his daughter is able to assist him as needed over the weekend with aide services returning Monday from 12-4PM.         If plan is discharge home, recommend the following: A little help with walking and/or transfers;A little help with bathing/dressing/bathroom;Assistance with cooking/housework;Assist for transportation;Help with stairs or ramp for entrance   Can travel by private vehicle        Equipment Recommendations None recommended by PT  Recommendations for Other Services       Functional Status Assessment Patient has had a recent decline in their functional status and demonstrates the ability to make significant improvements in function in a reasonable and predictable amount of time.     Precautions / Restrictions Precautions Precautions: Fall Recall of Precautions/Restrictions: Intact Precaution/Restrictions Comments: chronic hypotension Restrictions Weight Bearing Restrictions Per Provider Order: No      Mobility  Bed Mobility Overal bed mobility: Needs Assistance Bed Mobility: Supine to Sit     Supine to sit: Supervision           Transfers Overall transfer level: Needs assistance Equipment used: Rolling walker (2 wheels) Transfers: Sit to/from Stand Sit to Stand: Supervision                Ambulation/Gait Ambulation/Gait assistance: Supervision Gait Distance (Feet): 60 Feet (additional trial of 15') Assistive device: Rolling walker (2 wheels) Gait Pattern/deviations: Step-through pattern, Trunk flexed Gait velocity: reduced Gait velocity interpretation: <1.8 ft/sec, indicate of risk for recurrent falls   General Gait Details: slowed step-through gait, increased trunk flexion over RW  Stairs            Wheelchair Mobility     Tilt Bed    Modified Rankin (Stroke Patients Only)       Balance Overall balance assessment: Needs assistance Sitting-balance support: Feet supported, No upper extremity supported Sitting balance-Leahy Scale: Good     Standing balance support: Single extremity supported, Reliant on assistive device for balance Standing balance-Leahy Scale: Poor                               Pertinent Vitals/Pain Pain Assessment Pain Assessment: Faces Faces Pain Scale: Hurts even more Pain Location: legs Pain Descriptors / Indicators: Sore Pain Intervention(s): Monitored during session    Home Living Family/patient expects to be discharged to:: Private residence Living Arrangements: Alone Available Help at Discharge: Family;Available PRN/intermittently Type of Home: Apartment Home Access: Level entry       Home Layout: One level Home Equipment: Wheelchair - Forensic psychologist (2 wheels);Rollator (4 wheels);Shower seat;Hand held shower head Additional Comments: daughter lives 20 minutes away, aide services weekdays from 12-4PM    Prior Function  Prior Level of Function : Independent/Modified Independent;Driving             Mobility Comments: ambulatory with PRN use of SPC, RW or rollator. ADLs Comments: has aide 12-4 to help with groceries,  laundry, bathing     Extremity/Trunk Assessment   Upper Extremity Assessment Upper Extremity Assessment: Generalized weakness    Lower Extremity Assessment Lower Extremity Assessment: Generalized weakness    Cervical / Trunk Assessment Cervical / Trunk Assessment: Kyphotic  Communication   Communication Communication: Impaired Factors Affecting Communication: Hearing impaired (wears hearing aides)    Cognition Arousal: Alert Behavior During Therapy: WFL for tasks assessed/performed   PT - Cognitive impairments: No apparent impairments                         Following commands: Intact       Cueing Cueing Techniques: Verbal cues     General Comments General comments (skin integrity, edema, etc.): VSS on RA, pt denies orthostatic symptoms    Exercises     Assessment/Plan    PT Assessment Patient needs continued PT services  PT Problem List Decreased strength;Decreased range of motion;Decreased activity tolerance;Decreased balance;Decreased mobility;Decreased coordination;Decreased knowledge of use of DME;Decreased safety awareness;Decreased knowledge of precautions;Cardiopulmonary status limiting activity       PT Treatment Interventions DME instruction;Gait training;Stair training;Functional mobility training;Therapeutic activities;Therapeutic exercise;Balance training;Neuromuscular re-education;Patient/family education    PT Goals (Current goals can be found in the Care Plan section)  Acute Rehab PT Goals Patient Stated Goal: to return home, reduce pain in legs PT Goal Formulation: With patient Time For Goal Achievement: 04/29/24 Potential to Achieve Goals: Good    Frequency Min 2X/week     Co-evaluation               AM-PAC PT 6 Clicks Mobility  Outcome Measure Help needed turning from your back to your side while in a flat bed without using bedrails?: A Little Help needed moving from lying on your back to sitting on the side of a  flat bed without using bedrails?: A Little Help needed moving to and from a bed to a chair (including a wheelchair)?: A Little Help needed standing up from a chair using your arms (e.g., wheelchair or bedside chair)?: A Little Help needed to walk in hospital room?: A Little Help needed climbing 3-5 steps with a railing? : A Lot 6 Click Score: 17    End of Session Equipment Utilized During Treatment: Gait belt Activity Tolerance: Patient tolerated treatment well Patient left: in chair;with call bell/phone within reach Nurse Communication: Mobility status PT Visit Diagnosis: Other abnormalities of gait and mobility (R26.89)    Time: 9041-8981 PT Time Calculation (min) (ACUTE ONLY): 20 min   Charges:   PT Evaluation $PT Eval Low Complexity: 1 Low   PT General Charges $$ ACUTE PT VISIT: 1 Visit         [name], PT, DPT Acute Rehabilitation Office [phone]   [name] 04/15/2024, 10:28 AM

## 2024-04-16 ENCOUNTER — Telehealth (HOSPITAL_COMMUNITY): Payer: Self-pay | Admitting: Nephrology

## 2024-04-16 NOTE — Telephone Encounter (Signed)
 Transition of Care - Initial Contact after Hospitalization  Date of discharge:  04/15/24 Date of contact: 04/16/24  Method: Phone Spoke to: Patient's daughter  Patient contacted to discuss transition of care from recent inpatient hospitalization. Patient was admitted to [facility] from 10/6-10/10/25 with volume overload, clotted TDC, orthostatic hypotension, sacral OM, and A-fib.  The discharge medication list was reviewed. She understands the changes and has no concerns.   Patient will return to his/her outpatient HD unit on: Monday  Explained critical nature to keep his fluid intake low and come for every HD and take his midodrine  to keep BP up.   No other concerns at this time.  Izetta Boehringer, PA-C [employer] Pager [phone]

## 2024-04-17 LAB — CULTURE, BLOOD (ROUTINE X 2)
Culture: NO GROWTH
Culture: NO GROWTH
Special Requests: ADEQUATE
Special Requests: ADEQUATE

## 2024-04-18 ENCOUNTER — Emergency Department (HOSPITAL_COMMUNITY)

## 2024-04-18 ENCOUNTER — Encounter (HOSPITAL_COMMUNITY): Payer: Self-pay

## 2024-04-18 ENCOUNTER — Other Ambulatory Visit: Payer: Self-pay

## 2024-04-18 ENCOUNTER — Observation Stay (HOSPITAL_COMMUNITY)
Admission: EM | Admit: 2024-04-18 | Discharge: 2024-04-26 | Disposition: A | Discharge status: Skilled Nursing Facility | Attending: Internal Medicine | Admitting: Internal Medicine

## 2024-04-18 DIAGNOSIS — L8990 Pressure ulcer of unspecified site, unspecified stage: Secondary | ICD-10-CM | POA: Insufficient documentation

## 2024-04-18 DIAGNOSIS — N186 End stage renal disease: Secondary | ICD-10-CM | POA: Insufficient documentation

## 2024-04-18 DIAGNOSIS — L0291 Cutaneous abscess, unspecified: Secondary | ICD-10-CM | POA: Diagnosis present

## 2024-04-18 DIAGNOSIS — Z992 Dependence on renal dialysis: Secondary | ICD-10-CM | POA: Insufficient documentation

## 2024-04-18 DIAGNOSIS — E2740 Unspecified adrenocortical insufficiency: Secondary | ICD-10-CM | POA: Insufficient documentation

## 2024-04-18 DIAGNOSIS — G9341 Metabolic encephalopathy: Secondary | ICD-10-CM | POA: Diagnosis present

## 2024-04-18 DIAGNOSIS — K5190 Ulcerative colitis, unspecified, without complications: Secondary | ICD-10-CM | POA: Insufficient documentation

## 2024-04-18 DIAGNOSIS — R600 Localized edema: Secondary | ICD-10-CM | POA: Diagnosis present

## 2024-04-18 DIAGNOSIS — L89899 Pressure ulcer of other site, unspecified stage: Secondary | ICD-10-CM | POA: Insufficient documentation

## 2024-04-18 DIAGNOSIS — Z936 Other artificial openings of urinary tract status: Secondary | ICD-10-CM | POA: Insufficient documentation

## 2024-04-18 DIAGNOSIS — F10920 Alcohol use, unspecified with intoxication, uncomplicated: Secondary | ICD-10-CM | POA: Insufficient documentation

## 2024-04-18 DIAGNOSIS — R404 Transient alteration of awareness: Principal | ICD-10-CM

## 2024-04-18 DIAGNOSIS — G9340 Encephalopathy, unspecified: Principal | ICD-10-CM | POA: Insufficient documentation

## 2024-04-18 DIAGNOSIS — E875 Hyperkalemia: Secondary | ICD-10-CM

## 2024-04-18 DIAGNOSIS — A498 Other bacterial infections of unspecified site: Secondary | ICD-10-CM | POA: Diagnosis present

## 2024-04-18 DIAGNOSIS — I951 Orthostatic hypotension: Secondary | ICD-10-CM | POA: Diagnosis present

## 2024-04-18 DIAGNOSIS — Z933 Colostomy status: Secondary | ICD-10-CM

## 2024-04-18 DIAGNOSIS — I480 Paroxysmal atrial fibrillation: Secondary | ICD-10-CM | POA: Insufficient documentation

## 2024-04-18 DIAGNOSIS — Z79899 Other long term (current) drug therapy: Secondary | ICD-10-CM | POA: Insufficient documentation

## 2024-04-18 DIAGNOSIS — B9621 Shiga toxin-producing Escherichia coli [E. coli] (STEC) O157 as the cause of diseases classified elsewhere: Secondary | ICD-10-CM | POA: Insufficient documentation

## 2024-04-18 DIAGNOSIS — K6819 Other retroperitoneal abscess: Secondary | ICD-10-CM | POA: Insufficient documentation

## 2024-04-18 DIAGNOSIS — D631 Anemia in chronic kidney disease: Secondary | ICD-10-CM | POA: Insufficient documentation

## 2024-04-18 DIAGNOSIS — I120 Hypertensive chronic kidney disease with stage 5 chronic kidney disease or end stage renal disease: Secondary | ICD-10-CM | POA: Insufficient documentation

## 2024-04-18 LAB — COMPREHENSIVE METABOLIC PANEL WITH GFR
ALT: 17 U/L (ref 0–44)
AST: 25 U/L (ref 15–41)
Albumin: 3.3 g/dL — ABNORMAL LOW (ref 3.5–5.0)
Alkaline Phosphatase: 112 U/L (ref 38–126)
Anion gap: 18 — ABNORMAL HIGH (ref 5–15)
BUN: 89 mg/dL — ABNORMAL HIGH (ref 8–23)
CO2: 20 mmol/L — ABNORMAL LOW (ref 22–32)
Calcium: 9.8 mg/dL (ref 8.9–10.3)
Chloride: 100 mmol/L (ref 98–111)
Creatinine, Ser: 8.71 mg/dL — ABNORMAL HIGH (ref 0.61–1.24)
GFR, Estimated: 5 mL/min — ABNORMAL LOW (ref >60)
Glucose, Bld: 115 mg/dL — ABNORMAL HIGH (ref 70–99)
Potassium: 5.6 mmol/L — ABNORMAL HIGH (ref 3.5–5.1)
Sodium: 138 mmol/L (ref 135–145)
Total Bilirubin: 1.2 mg/dL (ref 0.0–1.2)
Total Protein: 6.7 g/dL (ref 6.5–8.1)

## 2024-04-18 LAB — CBC WITH DIFFERENTIAL/PLATELET
Abs Immature Granulocytes: 0.08 K/uL — ABNORMAL HIGH (ref 0.00–0.07)
Basophils Absolute: 0 K/uL (ref 0.0–0.1)
Basophils Relative: 0 %
Eosinophils Absolute: 0.1 K/uL (ref 0.0–0.5)
Eosinophils Relative: 1 %
HCT: 32.9 % — ABNORMAL LOW (ref 39.0–52.0)
Hemoglobin: 10.5 g/dL — ABNORMAL LOW (ref 13.0–17.0)
Immature Granulocytes: 1 %
Lymphocytes Relative: 6 %
Lymphs Abs: 0.6 K/uL — ABNORMAL LOW (ref 0.7–4.0)
MCH: 37.1 pg — ABNORMAL HIGH (ref 26.0–34.0)
MCHC: 31.9 g/dL (ref 30.0–36.0)
MCV: 116.3 fL — ABNORMAL HIGH (ref 80.0–100.0)
Monocytes Absolute: 0.6 K/uL (ref 0.1–1.0)
Monocytes Relative: 7 %
Neutro Abs: 7.9 K/uL — ABNORMAL HIGH (ref 1.7–7.7)
Neutrophils Relative %: 85 %
Platelets: 123 K/uL — ABNORMAL LOW (ref 150–400)
RBC: 2.83 MIL/uL — ABNORMAL LOW (ref 4.22–5.81)
RDW: 18.1 % — ABNORMAL HIGH (ref 11.5–15.5)
Smear Review: NORMAL
WBC: 9.3 K/uL (ref 4.0–10.5)
nRBC: 0.4 % — ABNORMAL HIGH (ref 0.0–0.2)

## 2024-04-18 LAB — CREATININE, SERUM
Creatinine, Ser: 8.76 mg/dL — ABNORMAL HIGH (ref 0.61–1.24)
GFR, Estimated: 5 mL/min — ABNORMAL LOW (ref >60)

## 2024-04-18 LAB — CBC
HCT: 31.6 % — ABNORMAL LOW (ref 39.0–52.0)
Hemoglobin: 10 g/dL — ABNORMAL LOW (ref 13.0–17.0)
MCH: 36.9 pg — ABNORMAL HIGH (ref 26.0–34.0)
MCHC: 31.6 g/dL (ref 30.0–36.0)
MCV: 116.6 fL — ABNORMAL HIGH (ref 80.0–100.0)
Platelets: 120 K/uL — ABNORMAL LOW (ref 150–400)
RBC: 2.71 MIL/uL — ABNORMAL LOW (ref 4.22–5.81)
RDW: 18.4 % — ABNORMAL HIGH (ref 11.5–15.5)
WBC: 9.2 K/uL (ref 4.0–10.5)
nRBC: 0.3 % — ABNORMAL HIGH (ref 0.0–0.2)

## 2024-04-18 LAB — RESP PANEL BY RT-PCR (RSV, FLU A&B, COVID)  RVPGX2
Influenza A by PCR: NEGATIVE
Influenza B by PCR: NEGATIVE
Resp Syncytial Virus by PCR: NEGATIVE
SARS Coronavirus 2 by RT PCR: NEGATIVE

## 2024-04-18 LAB — ETHANOL: Alcohol, Ethyl (B): <15 mg/dL (ref <15)

## 2024-04-18 LAB — I-STAT CG4 LACTIC ACID, ED: Lactic Acid, Venous: 1.3 mmol/L (ref 0.5–1.9)

## 2024-04-18 LAB — AMMONIA: Ammonia: 13 umol/L (ref 9–35)

## 2024-04-18 LAB — LIPASE, BLOOD: Lipase: 81 U/L — ABNORMAL HIGH (ref 11–51)

## 2024-04-18 MED ORDER — LIDOCAINE HCL (PF) 1 % IJ SOLN: 5.0000 mL | INTRAMUSCULAR | Status: DC | PRN

## 2024-04-18 MED ORDER — HEPARIN SODIUM (PORCINE) 1000 UNIT/ML DIALYSIS: 1000.0000 [IU] | INTRAMUSCULAR | Status: DC | PRN

## 2024-04-18 MED ORDER — LIDOCAINE-PRILOCAINE 2.5-2.5 % EX CREA: 1.0000 | TOPICAL_CREAM | CUTANEOUS | Status: DC | PRN

## 2024-04-18 MED ORDER — ATORVASTATIN CALCIUM 10 MG PO TABS
20.0000 mg | ORAL_TABLET | Freq: Every day | ORAL | Status: DC
  Administered 2024-04-18 – 2024-04-26 (×9): 20 mg via ORAL
  Filled 2024-04-18 (×10): qty 2

## 2024-04-18 MED ORDER — MIDODRINE HCL 5 MG PO TABS
30.0000 mg | ORAL_TABLET | Freq: Three times a day (TID) | ORAL | Status: DC
  Administered 2024-04-18 – 2024-04-26 (×24): 30 mg via ORAL
  Filled 2024-04-18 (×24): qty 6

## 2024-04-18 MED ORDER — PENTAFLUOROPROP-TETRAFLUOROETH EX AERO: 1.0000 | INHALATION_SPRAY | CUTANEOUS | Status: DC | PRN

## 2024-04-18 MED ORDER — ALTEPLASE 2 MG IJ SOLR: 2.0000 mg | Freq: Once | INTRAMUSCULAR | Status: DC | PRN

## 2024-04-18 MED ORDER — CHLORHEXIDINE GLUCONATE CLOTH 2 % EX PADS
6.0000 | MEDICATED_PAD | Freq: Every day | CUTANEOUS | Status: DC
Start: 2024-04-18 — End: 2024-04-20
  Administered 2024-04-19 – 2024-04-20 (×2): 6 via TOPICAL

## 2024-04-18 MED ORDER — ALTEPLASE 2 MG IJ SOLR
2.0000 mg | Freq: Once | INTRAMUSCULAR | Status: DC | PRN
Start: 2024-04-18 — End: 2024-04-18

## 2024-04-18 MED ORDER — DORZOLAMIDE HCL 2 % OP SOLN
1.0000 [drp] | Freq: Every day | OPHTHALMIC | Status: DC
  Administered 2024-04-18 – 2024-04-26 (×10): 1 [drp] via OPHTHALMIC
  Filled 2024-04-18: qty 10

## 2024-04-18 MED ORDER — CALCITRIOL 0.5 MCG PO CAPS
0.5000 ug | ORAL_CAPSULE | ORAL | Status: DC
  Administered 2024-04-20 – 2024-04-25 (×2): 0.5 ug via ORAL
  Filled 2024-04-18 (×3): qty 1

## 2024-04-18 MED ORDER — MIDODRINE HCL 5 MG PO TABS: 30.0000 mg | ORAL_TABLET | Freq: Three times a day (TID) | ORAL | Status: DC

## 2024-04-18 MED ORDER — SODIUM CHLORIDE 0.9 % IV SOLN
1.0000 g | INTRAVENOUS | Status: DC
  Administered 2024-04-18: 1 g via INTRAVENOUS
  Filled 2024-04-18: qty 1000

## 2024-04-18 MED ORDER — RAMELTEON 8 MG PO TABS
8.0000 mg | ORAL_TABLET | Freq: Every day | ORAL | Status: DC
  Administered 2024-04-18 – 2024-04-25 (×8): 8 mg via ORAL
  Filled 2024-04-18 (×10): qty 1

## 2024-04-18 MED ORDER — NEPRO/CARBSTEADY PO LIQD: 237.0000 mL | ORAL | Status: DC | PRN

## 2024-04-18 MED ORDER — TIMOLOL MALEATE 0.5 % OP SOLN
1.0000 [drp] | Freq: Two times a day (BID) | OPHTHALMIC | Status: DC
  Administered 2024-04-18 – 2024-04-26 (×16): 1 [drp] via OPHTHALMIC
  Filled 2024-04-18: qty 5

## 2024-04-18 MED ORDER — APIXABAN 2.5 MG PO TABS
2.5000 mg | ORAL_TABLET | Freq: Two times a day (BID) | ORAL | Status: DC
  Administered 2024-04-18 – 2024-04-26 (×16): 2.5 mg via ORAL
  Filled 2024-04-18 (×16): qty 1

## 2024-04-18 MED ORDER — ANTICOAGULANT SODIUM CITRATE 4% (200MG/5ML) IV SOLN: 5.0000 mL | Status: DC | PRN

## 2024-04-18 MED ORDER — PANTOPRAZOLE SODIUM 40 MG PO TBEC
40.0000 mg | DELAYED_RELEASE_TABLET | Freq: Two times a day (BID) | ORAL | Status: DC
  Administered 2024-04-18 – 2024-04-26 (×14): 40 mg via ORAL
  Filled 2024-04-18 (×14): qty 1

## 2024-04-18 MED ORDER — HEPARIN SODIUM (PORCINE) 5000 UNIT/ML IJ SOLN
5000.0000 [IU] | Freq: Three times a day (TID) | INTRAMUSCULAR | Status: DC
  Administered 2024-04-18: 5000 [IU] via SUBCUTANEOUS
  Filled 2024-04-18: qty 1

## 2024-04-18 MED ORDER — ERTAPENEM SODIUM 1 G IJ SOLR: 1.0000 g | INTRAMUSCULAR | Status: DC

## 2024-04-18 MED ORDER — FLUTICASONE FUROATE-VILANTEROL 100-25 MCG/ACT IN AEPB
1.0000 | INHALATION_SPRAY | Freq: Every day | RESPIRATORY_TRACT | Status: DC
  Administered 2024-04-19 – 2024-04-26 (×7): 1 via RESPIRATORY_TRACT
  Filled 2024-04-18: qty 28

## 2024-04-18 NOTE — Progress Notes (Signed)
Patient off the unit for Dialysis

## 2024-04-18 NOTE — ED Triage Notes (Signed)
  Pt to ED via [facility] from home. Pt's daughter called EMS d/t pt being more confused than usual. Pt has dialysis today and has been sitting in car waiting for transport to pick him up. Pt's daughter said pt has been outside since 0100 this am. Pt is possibly supposed to be having IV antibiotics, unsure why. Pt also fell at some time between 0100-0300 today and was seen by bystanders but did not want to be transported.   EMS VS  GCS 14 HR 80-110 106/60 RR 12 ETC02  Cbg 130

## 2024-04-18 NOTE — Progress Notes (Signed)
 Pt receives out-pt HD at [facility], MWF, [place] chair time. Will continue to assists as needed.   Lavanda Ameah Chanda Dialysis Navigator [phone]

## 2024-04-18 NOTE — ED Notes (Signed)
Pt does not make urine.

## 2024-04-18 NOTE — Progress Notes (Signed)
 Day shift Meds not verified by pharmacy, not given. Pharmacy messaged in this regard.

## 2024-04-18 NOTE — Progress Notes (Signed)
 Orthopedic Tech Progress Note Patient Details:  Joshua Riley 08-19-1935 [id number]  Went to apply NONIE BOOTS but RN stopped and said per [facility]. [facility], WOC stated she would like to see legs before applying U.B. will try again tomorrow   Patient ID: Joshua Riley, male   DOB: March 12, 1936, 88 y.o.   MRN: [id number]  Joshua Riley 04/18/2024, 6:29 PM

## 2024-04-18 NOTE — Evaluation (Addendum)
 Occupational Therapy Evaluation Patient Details Name: Joshua Riley MRN: [id number] DOB: Feb 23, 1936 Today's Date: 04/18/2024   History of Present Illness   88 y.o. male presents  to ED via EMS for altered mental status and fall.  Recent hospital admission 10/6 to 10/10 for  BLE edema and volume overload. PMH - ESRD on HD MWF, afib, CVA, depression, colectomy, ileostomy, chronic hypotension, DVT, acute pericarditis, groin abscess with lt transgluteal drain, nephrostomy tubes, chf, copd.     Clinical Impressions PTA pt lives alone and has a PCA 5 days/wk 4 hrs/day and a home nurse who assists with wound care and his drains/tubes; states his daughter lives 20 min away. At baseline pt uses a RW/SPC or rollator PRN for mobility, drives and takes a van to HD MWF. Daughter assists with medication management using a pillbox. Pt able to recount story of why he is in the hospital however feel cognition is most likely not at baseline. Pt scored a 14 on the Short Blessed test which places him in an impaired category (0-4 normal, 5-9 questionable impairment; >10, impaired). Currently pt requires S for mobility and ADL tasks at RW level. Discussed recommendation for post acute rehab however pt politely states he will not go anywhere but home. Agreeable to HHOT/PT. Acute OT to follow to maximize functional level of independence to facilitate safe DC home. It would be helpful for pt to have increased assistance at home after DC (either by family or PCA). Also recommend a fall alert system.      If plan is discharge home, recommend the following:   A lot of help with bathing/dressing/bathroom;Assistance with cooking/housework;Direct supervision/assist for medications management;Direct supervision/assist for financial management;Assist for transportation     Functional Status Assessment   Patient has had a recent decline in their functional status and demonstrates the ability to make significant  improvements in function in a reasonable and predictable amount of time.     Equipment Recommendations   None recommended by OT     Recommendations for Other Services         Precautions/Restrictions   Precautions Precautions: Fall Recall of Precautions/Restrictions: Intact Precaution/Restrictions Comments: chronic hypotension; ileostomyl gluteal drain; nephrostomy Restrictions Weight Bearing Restrictions Per Provider Order: No     Mobility Bed Mobility Overal bed mobility: Needs Assistance Bed Mobility: Supine to Sit     Supine to sit: Supervision          Transfers Overall transfer level: Needs assistance Equipment used: Rolling walker (2 wheels) Transfers: Sit to/from Stand Sit to Stand: Supervision                  Balance Overall balance assessment: Needs assistance Sitting-balance support: Feet supported, No upper extremity supported Sitting balance-Leahy Scale: Good     Standing balance support: Single extremity supported, Reliant on assistive device for balance Standing balance-Leahy Scale: Fair                             ADL either performed or assessed with clinical judgement   ADL Overall ADL's : Needs assistance/impaired Eating/Feeding: Independent   Grooming: Supervision/safety;Standing   Upper Body Bathing: Set up;Sitting   Lower Body Bathing: Supervison/ safety;Sit to/from stand   Upper Body Dressing : Set up;Sitting   Lower Body Dressing: Supervision/safety;Sit to/from stand   Toilet Transfer: Supervision/safety;Ambulation;Rolling walker (2 wheels)   Toileting- Clothing Manipulation and Hygiene: Set up Toileting - Clothing Manipulation Details (indicate cue type and  reason): manages his ostomy bag; nsg hleps with drain bag and urostomy     Functional mobility during ADLs: Supervision/safety;Rolling walker (2 wheels)       Vision Baseline Vision/History: 1 Wears glasses Ability to See in Adequate  Light: 0 Adequate Patient Visual Report: No change from baseline Vision Assessment?: Wears glasses for reading     Perception         Praxis         Pertinent Vitals/Pain Pain Assessment Pain Assessment: Faces Faces Pain Scale: Hurts even more Pain Location: legs Pain Descriptors / Indicators: Sore Pain Intervention(s): Limited activity within patient's tolerance     Extremity/Trunk Assessment Upper Extremity Assessment Upper Extremity Assessment: Generalized weakness (multiple skin tears/abrasions)   Lower Extremity Assessment Lower Extremity Assessment: Defer to PT evaluation (c/o numbness B feet)   Cervical / Trunk Assessment Cervical / Trunk Assessment: Kyphotic   Communication Communication Communication: Impaired Factors Affecting Communication: Hearing impaired (wears hearing aides)   Cognition Arousal: Alert Behavior During Therapy: WFL for tasks assessed/performed Cognition: Cognition impaired, No family/caregiver present to determine baseline     Awareness: Intellectual awareness intact, Online awareness impaired Memory impairment (select all impairments): Working Civil Service fast streamer, Conservation officer, historic buildings Attention impairment (select first level of impairment): Selective attention Executive functioning impairment (select all impairments): Reasoning OT - Cognition Comments: Pt scored a 14/28 on the Short Blessed Test, indicating cognitive impairment; deficits with attention and working memory noted; able to recount reason for admission. States he looked at his clock incorrectly adn thought it was time to go to dialysis, but tripped on his way out to the car. Someone walking by found him and helped him to his car. He looked at his watch again and found it was earlier than he thought so he just stayed in his car waiting on [place] van to HD                 Following commands: Intact       Cueing  General Comments   Cueing Techniques: Verbal cues       Exercises     Shoulder Instructions      Home Living Family/patient expects to be discharged to:: Private residence Living Arrangements: Alone Available Help at Discharge: Family;Available PRN/intermittently Type of Home: Apartment Home Access: Level entry     Home Layout: One level     Bathroom Shower/Tub: Producer, television/film/video: Standard Bathroom Accessibility: Yes How Accessible: Accessible via walker Home Equipment: Wheelchair - Forensic psychologist (2 wheels);Rollator (4 wheels);Shower seat;Hand held shower head;Grab bars - tub/shower   Additional Comments: daughter lives 20 minutes away, aide services weekdays from 12-4PM      Prior Functioning/Environment Prior Level of Function : Independent/Modified Independent;Driving;History of Falls (last six months)             Mobility Comments: ambulatory with PRN use of SPC, RW or rollator. ADLs Comments: has aide 12-4 to help with groceries, laundry, bathing M-F; manages his own ostomy and urostomy; [facility] nurse also helps with these drains/tubes    OT Problem List: Decreased strength;Decreased activity tolerance;Impaired balance (sitting and/or standing);Decreased safety awareness;Decreased knowledge of precautions;Decreased knowledge of use of DME or AE   OT Treatment/Interventions: Self-care/ADL training;Therapeutic exercise;Energy conservation;DME and/or AE instruction;Therapeutic activities;Cognitive remediation/compensation;Patient/family education;Balance training      OT Goals(Current goals can be found in the care plan section)   Acute Rehab OT Goals Patient Stated Goal: go home with therapy OT Goal Formulation: With patient Time For  Goal Achievement: 05/02/24 Potential to Achieve Goals: Good   OT Frequency:  Min 2X/week    Co-evaluation              AM-PAC OT 6 Clicks Daily Activity     Outcome Measure Help from another person eating meals?: None Help from another person taking care  of personal grooming?: A Little Help from another person toileting, which includes using toliet, bedpan, or urinal?: A Little Help from another person bathing (including washing, rinsing, drying)?: A Little Help from another person to put on and taking off regular upper body clothing?: A Little Help from another person to put on and taking off regular lower body clothing?: A Little 6 Click Score: 19   End of Session Equipment Utilized During Treatment: Gait belt;Rolling walker (2 wheels) Nurse Communication: Other (comment) (ostomy bag loose form the bottom and needs replaced; L upper arm bandage soaked in blood adn needs replaced)  Activity Tolerance: Patient tolerated treatment well Patient left: in bed;with call bell/phone within reach;with bed alarm set  OT Visit Diagnosis: Unsteadiness on feet (R26.81);Muscle weakness (generalized) (M62.81);History of falling (Z91.81);Other symptoms and signs involving cognitive function                Time: 8949-8878 OT Time Calculation (min): 31 min Charges:  OT General Charges $OT Visit: 1 Visit OT Evaluation $OT Eval Low Complexity: 1 Low OT Treatments $Self Care/Home Management : 8-22 mins  Kreg Sink, OT/L   Acute OT Clinical Specialist Acute Rehabilitation Services Pager [phone] Office [phone]   [facility] 04/18/2024, 11:43 AM

## 2024-04-18 NOTE — Consult Note (Signed)
 WOC Nurse Consult Note: Reason for Consult: LE wounds  Patient was seen by [facility] nursing team last week 10/8; has been DC and returned 10/13 for AMS  Wound type: venous ulcerations in the presence of PAD; apparently Unna's boots have been applied at some point; I did not feel comfortable placing orders last week without an ABI and known history of PAD.   Linear wounds on the left 2nd and 3rd toe; clean and dry; ok to apply xeroform every other day Black wound noted right medial 2nd toe; with history of PAD; continue to paint with betadine daily. Allow to air dry Large akin tear right arm, cleanse with saline, pat dry. Apply single layer of xeroform and change every other day and dry topper dressing.  Skin tear left elbow, skin flap present; attempt to re-aproximate skin flap and steristrip, apply single layer of xeroform gauze and dry dressing  Skin tear left bicep, cleanse with saline, pat dry. Cover with single layer of xeroform and top with dry dressing or foam. Change every other day.  Skin tear left outer bicep; cleanse with saline, pat dry. Apply single layer of xeroform and top with dry dressing. Change every other day.  Dry lesion left shoulder blade See ostomy care below.  Last week patient had two lesions on the bilateral LEs, will have staff remove Unna's boots and verify with MD to replace.   Pressure Injury POA: NA Measurement: see nursing flow sheets Wound bed: see above  Drainage (amount, consistency, odor)  see nursing flow sheets Periwound:intact, Dressing procedure/placement/frequency: See above    WOC team with longstanding ileostomy  Stoma type/location: ileostomy since 2011  Treatment options for stomal/peristomal skin: 2 barrier ring  Output see nursing flow sheets Ostomy pouching: 1 piece Soft convex lawson #[id number], barrier ring  Gerlean [phone] Education provided: none, longstanding ostomy.  OK for bedside nurse or patient to change pouch.    If skin  denuded,weeping, broken down around stoma crust skin as follows:  Sprinkle stoma powder  Soila #6) over any irritated skin, brush away excess powder Tap lightly over the powder with Cavilon No-Sting barrier wipe (skin barrier wipe-white and blue package) or gloved finger damp with water Allow to dry Apply another layer of powder following the same steps up to three layers.  After dry proceed with routine pouching       Re consult if needed, will not follow at this time. Thanks  Finesse Fielder [employer], RN,CWOCN, CNS, [employer] [phone]

## 2024-04-18 NOTE — Consult Note (Signed)
 Renal Service Consult Note [facility] Joshua JONETTA Fret, MD  Patient: Joshua Riley Date: 04/18/2024 Requesting Physician: Dr. Georgina  Reason for Consult: ESRD pt w/ AMS HPI: The patient is a 88 y.o. year-old w/ PMH as below who presented to ED via EMS for altered mental status.  Daughter said patient was more confused than usual.  He was due for dialysis today.  Also patient fell sometime early this morning (0100-0300) and was seen by bystanders but did not want to be transported. In ED BP 97/40, HR 80-100, RR 14-20, temp 97.5, K+ 5.6, b/cr 89/ 8.7.   Pt seen in room. No hx obtained, pt is quite lethargic.   Pt recently in hospital for MDR [facility] abdominal abscess and sacral osteomyelitis, getting IV ertapenem  at the HD unit.    ROS - n/a   Past Medical History  Past Medical History:  Diagnosis Date   A-fib (HCC)    Anemia    Arthritis    Cancer (HCC)    Basal cell   COVID-19    2021   Dysrhythmia    Afib-controlled on eliquis    ESRD (end stage renal disease) (HCC) 10/22/2021   Glaucoma 11/18/2021   History of DVT (deep vein thrombosis)    Hydronephrosis    managed wtih a PCN   Idiopathic neuropathy 10/22/2021   lyrica     Ileostomy in place [facility])    Obstructive uropathy    With chronic left nephrostomy   Old retinal detachment, total or subtotal    Orthostatic hypotension 10/22/2021   Sleep apnea    does not need a machine   Stroke (HCC)    Ulcerative colitis (HCC)    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   Past Surgical History  Past Surgical History:  Procedure Laterality Date   BASAL CELL CARCINOMA EXCISION     10/23   COLON SURGERY     creation of j pouch     and subsequent takedown of j pouch   EYE SURGERY     IR CATHETER TUBE CHANGE  12/18/2023   IR CATHETER TUBE CHANGE  02/19/2024   IR CATHETER TUBE CHANGE  02/19/2024   IR NEPHROSTOMY EXCHANGE LEFT  12/10/2021   IR NEPHROSTOMY EXCHANGE LEFT  04/22/2022   IR  NEPHROSTOMY EXCHANGE LEFT  07/29/2022   IR NEPHROSTOMY EXCHANGE LEFT  10/28/2022   IR NEPHROSTOMY EXCHANGE LEFT  02/05/2023   IR NEPHROSTOMY EXCHANGE LEFT  05/07/2023   IR NEPHROSTOMY EXCHANGE LEFT  06/25/2023   IR NEPHROSTOMY EXCHANGE LEFT  07/17/2023   IR NEPHROSTOMY EXCHANGE LEFT  10/22/2023   IR RADIOLOGIST EVAL & MGMT  11/25/2023   IR RADIOLOGIST EVAL & MGMT  12/18/2023   IR RADIOLOGIST EVAL & MGMT  02/19/2024   IR RADIOLOGIST EVAL & MGMT  04/04/2024   IR THROMBECTOMY AV FISTULA W/THROMBOLYSIS/PTA INC/SHUNT/IMG LEFT Left 02/23/2024   IR US  GUIDE VASC ACCESS LEFT  02/23/2024   LEFT HEART CATH AND CORONARY ANGIOGRAPHY N/A 07/22/2022   Procedure: LEFT HEART CATH AND CORONARY ANGIOGRAPHY;  Surgeon: Swaziland, Peter M, MD;  Location: [facility] INVASIVE CV LAB;  Service: Cardiovascular;  Laterality: N/A;   REVISION OF ARTERIOVENOUS GORETEX GRAFT Left 05/06/2022   Procedure: REDO LEFT THIGH ARTERIOVENOUS 4-7 MM GORETEX GRAFT;  Surgeon: Eliza Lonni RAMAN, MD;  Location: [facility] OR;  Service: Vascular;  Laterality: Left;   SMALL INTESTINE SURGERY     TOTAL COLECTOMY     Family History  Family History  Problem Relation Age of Onset   Stroke Mother    Cancer Father    Esophageal cancer Brother    Social History  reports that he quit smoking about 40 years ago. His smoking use included cigarettes. He started smoking about 46 years ago. He has a 12 pack-year smoking history. He has never been exposed to tobacco smoke. He has never used smokeless tobacco. He reports current alcohol use of about 5.0 standard drinks of alcohol per week. He reports that he does not use drugs. Allergies  Allergies  Allergen Reactions   Baclofen Other (See Comments)    Altered mental status, after accidental overdose     Cephalosporins Rash   Ciprofloxacin Itching and Rash   Wound Dressing Adhesive Rash   Home medications Prior to Admission medications   Medication Sig Start Date End Date Taking? Authorizing Provider   atorvastatin  (LIPITOR) 20 MG tablet Take 1 tablet (20 mg total) by mouth daily. 11/14/23   Sebastian Toribio GAILS, MD  calcitRIOL  (ROCALTROL ) 0.5 MCG capsule Take 1 capsule (0.5 mcg total) by mouth every Monday, Wednesday, and Friday with hemodialysis. 12/04/23   Danford, Lonni SQUIBB, MD  Cyanocobalamin  (VITAMIN B-12 PO) Take 1 capsule by mouth in the morning and at bedtime.    [provider]  dorzolamide  (TRUSOPT ) 2 % ophthalmic solution Place 1 drop into the right eye at bedtime. 06/17/23   [provider]  ELIQUIS  2.5 MG TABS tablet Take 1 tablet (2.5 mg total) by mouth 2 (two) times daily. 02/17/24   Katrinka Garnette KIDD, MD  ertapenem  1 g in sodium chloride  0.9 % 100 mL Inject 1 g into the vein every Monday, Wednesday, and Friday at 6 PM. 04/15/24   Samtani, Jai-Gurmukh, MD  folic acid  (FOLVITE ) 1 MG tablet Take 1 tablet (1 mg total) by mouth daily. 07/21/23   Regalado, Belkys A, MD  gabapentin  (NEURONTIN ) 300 MG capsule TAKE 1 CAPSULE BY MOUTH ONCE A DAY AT BEDTIME FOR NEUROPATHY 03/21/24   Katrinka Garnette KIDD, MD  hydrocortisone  (CORTEF ) 10 MG tablet Take 1 tablet (10 mg total) by mouth every evening. 04/15/24   Samtani, Jai-Gurmukh, MD  hydrocortisone  (CORTEF ) 20 MG tablet Take 1 tablet (20 mg total) by mouth daily. 04/15/24   Samtani, Jai-Gurmukh, MD  midodrine  (PROAMATINE ) 10 MG tablet Take 3 tablets (30 mg total) by mouth 3 (three) times daily. 04/15/24   Samtani, Jai-Gurmukh, MD  mometasone -formoterol  (DULERA ) 200-5 MCG/ACT AERO Inhale 2 puffs into the lungs 2 (two) times daily. 04/07/24   Katrinka Garnette KIDD, MD  multivitamin (RENA-VIT) TABS tablet Take 1 tablet by mouth daily.    [provider]  Omega Fatty Acids-Vitamins (OMEGA-3 GUMMIES) CHEW Chew 1,200 mg by mouth in the morning and at bedtime.    [provider]  pantoprazole  (PROTONIX ) 40 MG tablet Take 1 tablet (40 mg total) by mouth 2 (two) times daily. 11/13/23   Sebastian Toribio GAILS, MD  ramelteon  (ROZEREM )  8 MG tablet Take 1 tablet (8 mg total) by mouth at bedtime. 10/27/23   Katrinka Garnette KIDD, MD  Sodium Chloride  Flush (SALINE FLUSH) 0.9 % SOLN Use 5 mLs by Intracatheter route daily as directed. 11/25/23   Caperilla, Marissa N, PA  timolol  (TIMOPTIC ) 0.5 % ophthalmic solution Place 1 drop into both eyes in the morning and at bedtime.    [provider]  traMADol  (ULTRAM ) 50 MG tablet Take 1 tablet (50 mg total) by mouth every 12 (twelve) hours as needed (for pain). 04/07/24  Katrinka Garnette KIDD, MD  TYLENOL  325 MG tablet Take 325-650 mg by mouth every 6 (six) hours as needed for mild pain (pain score 1-3) or headache.    [provider]     Vitals:   04/18/24 0645 04/18/24 0700 04/18/24 0705 04/18/24 0730  BP: (!) 99/57 93/64  97/65  Pulse: (!) 102   86  Resp: 14  18 14   Temp:    (!) 97.5 F (36.4 C)  TempSrc:    Oral  SpO2: 100%   100%  Weight:      Height:       Exam Gen elderly wm, no distress, lethargic, on RA Open eyes and follows simple commands then falls back asleep Sclera anicteric, throat clear  No jvd or bruits Chest clear bilat to bases RRR no MRG Abd soft ntnd no mass or ascites +bs Ext no sig LE or UE edema Neuro as above    LUA AVG+bruit   Home bp meds: Midodrine  30mg  tid   OP HD: MWF NW 2h   B400  72kg   2K bath   AVG    Heparin  none  BP 97/65, HR 80-100, RR 14-20, temp 97.5 K+ 5.6, BUN 89, creat 8.7, Hb 10   Assessment/ Plan: AMS: pt acting more confused than usual, per family. CT head negative. Per pmd.  ESRD: on HD MWF. Due for HD today. Plan HD 2nd shift.  Hyperkalemia: K+ 5.6, plan is for HD today, renal diet.  BP: chronic hypotension on high dose midodrine  30 tid and hydrocortisone  daily for adrenal failure.  Volume: no edema on exam, on RA. Follow.  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   Inpatient medications:  heparin   5,000 Units Subcutaneous Q8H   midodrine   30 mg Oral TID

## 2024-04-18 NOTE — ED Provider Notes (Signed)
 Emergency Department Provider Note   I have reviewed the triage vital signs and the nursing notes.   HISTORY  Chief Complaint Altered Mental Status   HPI Bryson Gavia is a 88 y.o. male ESRD MWF with recent admission for sepsis related to abdominal abscess and sacral osteomyelitis with MDR E. coli on ertapenem  which she gets during dialysis presents to the emergency department with increased confusion.  Daughter at bedside states he has become increasingly confused with a fall yesterday.  Patient unsure what caused the fall or if he hit his head.  States in the last 24 to 48 hours he has become increasingly confused.  He is supposed to go to dialysis this morning but went out to wait in his car at around 1 AM and has been there most of the night.  He would typically go out around 5:30 AM.  Patient tells me he got confused with his time and he is feeling fine.  He feels like this is an overreaction by his daughter. He is able to tell me he is at [facility] and it is 2025.    Past Medical History:  Diagnosis Date   A-fib (HCC)    Anemia    Arthritis    Cancer (HCC)    Basal cell   COVID-19    2021   Dysrhythmia    Afib-controlled on eliquis    ESRD (end stage renal disease) (HCC) 10/22/2021   Glaucoma 11/18/2021   History of DVT (deep vein thrombosis)    Hydronephrosis    managed wtih a PCN   Idiopathic neuropathy 10/22/2021   lyrica     Ileostomy in place [facility])    Obstructive uropathy    With chronic left nephrostomy   Old retinal detachment, total or subtotal    Orthostatic hypotension 10/22/2021   Sleep apnea    does not need a machine   Stroke (HCC)    Ulcerative colitis (HCC)    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 Respiratory: Denies shortness of breath. Gastrointestinal: No abdominal pain.  Skin: Known gluteal wound, rash to bilateral  feet Neurological: Positive AMS per daughter.   ____________________________________________   PHYSICAL EXAM:  VITAL SIGNS: ED Triage Vitals  Encounter Vitals Group     BP 04/18/24 0546 98/65     Pulse Rate 04/18/24 0546 91     Resp 04/18/24 0546 20     Temp 04/18/24 0546 (!) 97.4 F (36.3 C)     Temp Source 04/18/24 0546 Oral     SpO2 04/18/24 0546 100 %     Weight 04/18/24 0549 169 lb 12.1 oz (77 kg)     Height 04/18/24 0549 5' 8 (1.727 m)   Constitutional: Alert and oriented to person, place, and year. Well appearing and in no acute distress. Eyes: Conjunctivae are normal.  Head: Atraumatic. Nose: No congestion/rhinnorhea. Mouth/Throat: Mucous membranes are moist.   Neck: No stridor.  Cardiovascular: Normal rate, regular rhythm. Good peripheral circulation. Grossly normal heart sounds.   Respiratory: Normal respiratory effort.  No retractions. Lungs CTAB. Gastrointestinal: Soft and nontender. No distention.  Musculoskeletal: No gross deformities of extremities. Neurologic:  Normal speech and language.  Skin:  Skin is warm, dry.  Patient with abrasions to the right arm.  Erythema with some shallow skin ulcerations of the bilateral feet.   ____________________________________________   LABS (all labs ordered are listed, but only  abnormal results are displayed)  Labs Reviewed  CULTURE, BLOOD (ROUTINE X 2)  CULTURE, BLOOD (ROUTINE X 2)  RESP PANEL BY RT-PCR (RSV, FLU A&B, COVID)  [id number]  COMPREHENSIVE METABOLIC PANEL WITH GFR  ETHANOL  LIPASE, BLOOD  CBC WITH DIFFERENTIAL/PLATELET  AMMONIA  URINALYSIS, W/ REFLEX TO CULTURE (INFECTION SUSPECTED)  RAPID URINE DRUG SCREEN, HOSP PERFORMED  I-STAT CG4 LACTIC ACID, ED   ____________________________________________  EKG   EKG Interpretation Date/Time:  Monday April 18 2024 05:47:06 EDT Ventricular Rate:  84 PR Interval:    QRS Duration:  136 QT Interval:  391 QTC Calculation: 463 R Axis:   102  Text  Interpretation: Atrial fibrillation Right bundle branch block Minimal ST elevation, lateral leads Confirmed by Darra Chew [phone]) on 04/18/2024 6:06:29 AM        ____________________________________________  RADIOLOGY  No results found.  ____________________________________________   PROCEDURES  Procedure(s) performed:   Procedures   ____________________________________________   INITIAL IMPRESSION / ASSESSMENT AND PLAN / ED COURSE  Pertinent labs & imaging results that were available during my care of the patient were reviewed by me and considered in my medical decision making (see chart for details).   This patient is Presenting for Evaluation of AMS, which does require a range of treatment options, and is a complaint that involves a high risk of morbidity and mortality.  The Differential Diagnoses includes but is not exclusive to alcohol, illicit or prescription medications, intracranial pathology such as stroke, intracerebral hemorrhage, fever or infectious causes including sepsis, hypoxemia, uremia, trauma, endocrine related disorders such as diabetes, hypoglycemia, thyroid-related diseases, etc.   Critical Interventions-    Medications - No data to display  Reassessment after intervention:     I did obtain Additional Historical Information from daughter and EMS, as the patient is reportedly altered.  I decided to review pertinent External Data, and in summary recent discharge from the hospital 3 days prior.    Clinical Laboratory Tests Ordered, included ***  Radiologic Tests Ordered, included CT head. I independently interpreted the images and agree with radiology interpretation.   Cardiac Monitor Tracing which shows A fib.    Social Determinants of Health Risk patient lives at home alone.   Consult complete with  Medical Decision Making: Summary:  Patient presents to the emergency department with altered mental status.  He apparently went out for his  dialysis appointment at 1 AM to wait in the car and stayed most of the night.  He is awake, alert, oriented for me now.  He is high risk for mental status change with ongoing treatment for known sacral osteomyelitis and MDR E. coli.  Patient also requiring ESRD with next HD session scheduled for today. Patient also with a fall yesterday. Will follow initial labs and CT head imaging.   Reevaluation with update and discussion with   ***Considered admission***  Patient's presentation is most consistent with acute presentation with potential threat to life or bodily function.   Disposition:   ____________________________________________  FINAL CLINICAL IMPRESSION(S) / ED DIAGNOSES  Final diagnoses:  None     NEW OUTPATIENT MEDICATIONS STARTED DURING THIS VISIT:  New Prescriptions   No medications on file    Note:  This document was prepared using Dragon voice recognition software and may include unintentional dictation errors.  Chew Darra, MD, [facility] Emergency Medicine

## 2024-04-18 NOTE — Consult Note (Signed)
 WOC consulted earlier today; I have addressed LE wounds; and alerted MD about Unna's boot order which he indicated he wanted to replace.   I have been consulted again for worsening of the LE wounds, I have asked for images of the wounds since the legs were wrapped in previous pictures.   I will review when images are taken, patient is in HD now.     Geoge Lawrance MSN, RN,CWOCN, CNS, [place] [phone]   Addendum [phone] Will address wound care for LEs, appears to have new linear areas on the pretibial area on the right leg; MDRPI (medical device pressure injury) from Unnas boots.  MD wanted Unna's boots reapplied I will follow up with nursing today  Mathhew Buysse [facility], CNS, CWON-AP [phone]

## 2024-04-18 NOTE — Plan of Care (Signed)
@   [id number] Patient received from ED, irritable, angry, alert oriented X4, on room air. BP 91/57 MD notified. Skin assessment done with Ozell PEAK. Multiple wounds, some oozing blood BIL Upper extremities - cleansed and patched with foam dressing.  Colonoscopy bag not intact, Ostomy wound RN notified, stated RN to order supplies and secure it. Supplies ordered.  Tape used to secure the colostomy to red tender skin right lower quadrant. Bag emptied 300 green brown thick and mucousy. Nephro drains left lower back appear tugged, thick yellow drainage.  Bil lower legs came in with Unna boots, removed and pictures updated. Legs cleansed and dressed per wound RN orders - Xerofoam gauze and foam dressing applied.  Dialysis performed. Daughter [name] called and updates given.

## 2024-04-18 NOTE — H&P (Signed)
 History and Physical    Patient: Joshua Riley FMW:[id number] DOB: [birth year] DOA: 04/18/2024 DOS: the patient was seen and examined on 04/18/2024 PCP: Katrinka Garnette KIDD, MD  Patient coming from: Home  Chief Complaint:  Chief Complaint  Patient presents with   Altered Mental Status   HPI: Joshua Riley is a 88 y.o. 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  Pt is unable to provide a medical history (daughter unable to be reached by phone), and only relays that he presented to the ED at the request of his daughter. Per nephrology, daughter said patient was more confused than usual.  He was due for dialysis today.  Also patient fell sometime early this morning (0100-0300) and was seen by bystanders but did not want to be transported.  In the ED, the patient was tachycardic and hypotensive. Labs notable for K 5.6, Cr 8.71, lactic acid 1.3 and WBC 9.2. EDP consulted nephrology and requested medicine admission.   Review of Systems: As mentioned in the history of present illness. All other systems reviewed and are negative. Past Medical History:  Diagnosis Date   A-fib (HCC)    Anemia    Arthritis    Cancer (HCC)    Basal cell   COVID-19    2021   Dysrhythmia    Afib-controlled on eliquis    ESRD (end stage renal disease) (HCC) 10/22/2021   Glaucoma 11/18/2021   History of DVT (deep vein  thrombosis)    Hydronephrosis    managed wtih a PCN   Idiopathic neuropathy 10/22/2021   lyrica     Ileostomy in place [facility])    Obstructive uropathy    With chronic left nephrostomy   Old retinal detachment, total or subtotal    Orthostatic hypotension 10/22/2021   Sleep apnea    does not need a machine   Stroke (HCC)    Ulcerative colitis (HCC)    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   Past Surgical History:  Procedure Laterality Date   BASAL CELL CARCINOMA EXCISION     10/23   COLON SURGERY     creation of j pouch     and subsequent takedown of j pouch   EYE SURGERY     IR CATHETER TUBE CHANGE  12/18/2023   IR CATHETER TUBE CHANGE  02/19/2024   IR CATHETER TUBE CHANGE  02/19/2024   IR NEPHROSTOMY EXCHANGE LEFT  12/10/2021   IR NEPHROSTOMY EXCHANGE LEFT  04/22/2022   IR NEPHROSTOMY EXCHANGE LEFT  07/29/2022   IR NEPHROSTOMY EXCHANGE LEFT  10/28/2022   IR NEPHROSTOMY EXCHANGE LEFT  02/05/2023   IR NEPHROSTOMY EXCHANGE LEFT  05/07/2023   IR NEPHROSTOMY EXCHANGE LEFT  06/25/2023   IR NEPHROSTOMY EXCHANGE LEFT  07/17/2023   IR NEPHROSTOMY EXCHANGE LEFT  10/22/2023   IR RADIOLOGIST EVAL & MGMT  11/25/2023   IR RADIOLOGIST EVAL & MGMT  12/18/2023   IR RADIOLOGIST EVAL & MGMT  02/19/2024   IR RADIOLOGIST EVAL & MGMT  04/04/2024   IR THROMBECTOMY AV FISTULA W/THROMBOLYSIS/PTA  INC/SHUNT/IMG LEFT Left 02/23/2024   IR US  GUIDE VASC ACCESS LEFT  02/23/2024   LEFT HEART CATH AND CORONARY ANGIOGRAPHY N/A 07/22/2022   Procedure: LEFT HEART CATH AND CORONARY ANGIOGRAPHY;  Surgeon: Swazil[place], Peter M, MD;  Location: [facility] INVASIVE CV LAB;  Service: Cardiovascular;  Laterality: N/A;   REVISION OF ARTERIOVENOUS GORETEX GRAFT Left 05/06/2022   Procedure: REDO LEFT THIGH ARTERIOVENOUS 4-7 MM GORETEX GRAFT;  Surgeon: Eliza Lonni RAMAN, MD;  Location: [facility] OR;  Service: Vascular;  Laterality: Left;   SMALL INTESTINE SURGERY     TOTAL COLECTOMY     Social History:  reports that he quit  smoking about 40 years ago. His smoking use included cigarettes. He started smoking about 46 years ago. He has a 12 pack-year smoking history. He has never been exposed to tobacco smoke. He has never used smokeless tobacco. He reports current alcohol use of about 5.0 standard drinks of alcohol per week. He reports that he does not use drugs.  Allergies  Allergen Reactions   Baclofen Other (See Comments)    Altered mental status, after accidental overdose     Cephalosporins Rash   Ciprofloxacin Itching and Rash   Wound Dressing Adhesive Rash    Family History  Problem Relation Age of Onset   Stroke Mother    Cancer Father    Esophageal cancer Brother     Prior to Admission medications   Medication Sig Start Date End Date Taking? Authorizing Provider  atorvastatin  (LIPITOR) 20 MG tablet Take 1 tablet (20 mg total) by mouth daily. 11/14/23   Sebastian Toribio GAILS, MD  calcitRIOL  (ROCALTROL ) 0.5 MCG capsule Take 1 capsule (0.5 mcg total) by mouth every Monday, Wednesday, and Friday with hemodialysis. 12/04/23   Danford, Lonni SQUIBB, MD  Cyanocobalamin  (VITAMIN B-12 PO) Take 1 capsule by mouth in the morning and at bedtime.    [provider]  dorzolamide  (TRUSOPT ) 2 % ophthalmic solution Place 1 drop into the right eye at bedtime. 06/17/23   [provider]  ELIQUIS  2.5 MG TABS tablet Take 1 tablet (2.5 mg total) by mouth 2 (two) times daily. 02/17/24   Katrinka Garnette KIDD, MD  ertapenem  1 g in sodium chloride  0.9 % 100 mL Inject 1 g into the vein every Monday, Wednesday, and Friday at 6 PM. 04/15/24   Samtani, Jai-Gurmukh, MD  folic acid  (FOLVITE ) 1 MG tablet Take 1 tablet (1 mg total) by mouth daily. 07/21/23   Regalado, Belkys A, MD  gabapentin  (NEURONTIN ) 300 MG capsule TAKE 1 CAPSULE BY MOUTH ONCE A DAY AT BEDTIME FOR NEUROPATHY 03/21/24   Katrinka Garnette KIDD, MD  hydrocortisone  (CORTEF ) 10 MG tablet Take 1 tablet (10 mg total) by mouth every evening. 04/15/24   Samtani,  Jai-Gurmukh, MD  hydrocortisone  (CORTEF ) 20 MG tablet Take 1 tablet (20 mg total) by mouth daily. 04/15/24   Samtani, Jai-Gurmukh, MD  midodrine  (PROAMATINE ) 10 MG tablet Take 3 tablets (30 mg total) by mouth 3 (three) times daily. 04/15/24   Samtani, Jai-Gurmukh, MD  mometasone -formoterol  (DULERA ) 200-5 MCG/ACT AERO Inhale 2 puffs into the lungs 2 (two) times daily. 04/07/24   Katrinka Garnette KIDD, MD  multivitamin (RENA-VIT) TABS tablet Take 1 tablet by mouth daily.    [provider]  Omega Fatty Acids-Vitamins (OMEGA-3 GUMMIES) CHEW Chew 1,200 mg by mouth in the morning and at bedtime.    [provider]  pantoprazole  (PROTONIX ) 40 MG tablet Take 1 tablet (40 mg total) by mouth 2 (two) times  daily. 11/13/23   Sebastian Toribio GAILS, MD  ramelteon  (ROZEREM ) 8 MG tablet Take 1 tablet (8 mg total) by mouth at bedtime. 10/27/23   Katrinka Garnette KIDD, MD  Sodium Chloride  Flush (SALINE FLUSH) 0.9 % SOLN Use 5 mLs by Intracatheter route daily as directed. 11/25/23   Caperilla, Marissa N, PA  timolol  (TIMOPTIC ) 0.5 % ophthalmic solution Place 1 drop into both eyes in the morning and at bedtime.    [provider]  traMADol  (ULTRAM ) 50 MG tablet Take 1 tablet (50 mg total) by mouth every 12 (twelve) hours as needed (for pain). 04/07/24   Katrinka Garnette KIDD, MD  TYLENOL  325 MG tablet Take 325-650 mg by mouth every 6 (six) hours as needed for mild pain (pain score 1-3) or headache.    [provider]    Physical Exam: Vitals:   04/18/24 0700 04/18/24 0705 04/18/24 0730 04/18/24 0755  BP: 93/64  97/65 (!) 91/57  Pulse:   86 86  Resp:  18 14 20   Temp:   (!) 97.5 F (36.4 C) (!) 97.5 F (36.4 C)  TempSrc:   Oral Axillary  SpO2:   100% 99%  Weight:      Height:       General: Alert, oriented x3, resting comfortably in no acute distress HEENT: EOMI, oropharynx clear, moist mucous membranes, hearing intact Neck: Trachea midline and no gross thyromegaly Respiratory: Lungs clear  to auscultation bilaterally with normal respiratory effort; no w/r/r Cardiovascular: Regular rate and rhythm w/o m/r/g Abdomen: Soft, nontender, nondistended. Positive bowel sounds MSK: No obvious joint deformities or swelling Skin: No obvious rashes or lesions Neurologic: Awake, alert, spontaneously moves all extremities, strength intact Psychiatric: Appropriate mood and affect, conversational and cooperative   Data Reviewed:  Lab Results  Component Value Date   WBC 9.2 04/18/2024   HGB 10.0 (L) 04/18/2024   HCT 31.6 (L) 04/18/2024   MCV 116.6 (H) 04/18/2024   PLT 120 (L) 04/18/2024   Lab Results  Component Value Date   GLUCOSE 115 (H) 04/18/2024   CALCIUM  9.8 04/18/2024   NA 138 04/18/2024   K 5.6 (H) 04/18/2024   CO2 20 (L) 04/18/2024   CL 100 04/18/2024   BUN 89 (H) 04/18/2024   CREATININE 8.76 (H) 04/18/2024   Lab Results  Component Value Date   ALT 17 04/18/2024   AST 25 04/18/2024   ALKPHOS 112 04/18/2024   BILITOT 1.2 04/18/2024   Lab Results  Component Value Date   INR 1.2 03/15/2024   INR 1.2 02/23/2024   INR 1.3 (H) 01/25/2024   Radiology: CT Head Wo Contrast Result Date: 04/18/2024 EXAM: CT HEAD WITHOUT CONTRAST 04/18/2024 06:34:38 AM TECHNIQUE: CT of the head was performed without the administration of intravenous contrast. Automated exposure control, iterative reconstruction, and/or weight based adjustment of the mA/kV was utilized to reduce the radiation dose to as low as reasonably achievable. COMPARISON: Brain MRI 11/29/2023, head CT 11/28/2023. CLINICAL HISTORY: 88 year old male with mental status change, unknown cause (AMS). FINDINGS: BRAIN AND VENTRICLES: No acute hemorrhage. No evidence of acute infarct. No hydrocephalus. No extra-axial collection. No mass effect or midline shift. Cerebral volume is stable and within normal limits for age. Very mild for age chronic periventricular white matter hypodensity is stable. Calcified atherosclerosis at the  skull base. No suspicious intracranial vascular hyperdensity. ORBITS: Postoperative changes to both globes. SINUSES: Mild paranasal sinus mucosal thickening. SOFT TISSUES AND SKULL: Chronic calcified scalp vessel atherosclerosis. No acute soft tissue abnormality. No  skull fracture. IMPRESSION: 1. No acute intracranial abnormality. 2. Negative for age non-contrast CT appearance of the brain. Electronically signed by: Helayne Hurst MD 04/18/2024 06:47 AM EDT RP Workstation: [id number]    Assessment and Plan: 30M h/o ESRD MWF (w/ recent complication clotted dialysis access; has right femoral tunneled HD cath), obstructive uropathy with left-sided ileostomy ileal pouch (2011), anal anastomosis surgery (2012), ulcerative colitis left-sided abdominal ileostomy colostomy c/b recent bowel obstruction in 01/2024, paroxysmal A-fib CHADVASC >4 on Eliquis , sacral osteomyelitis completing prolonged ertapenem  vancomycin , and chronic hypotension with recent admission last week for severe orthostatic hypotension treated w/ midodrine  30mg  TID iso known adrenal insufficiency (not on hydrocortisone ) who p/w acute encephalopathy.  Acute encephalopathy FTT Pt with acute confusion iso typical HD need; likely related to ESRD; infectious etiology unlikely given ongoing abx, normal WBC/lactic acid; of note, overall worsening wound care c/f poor OP care and inability to care for himself -PT/OT eval; anticipate pt would benefit from SNF -HD per renal   ESRD -Renal consulted; apprec eval/recs  Sacral osteomyelitis -PTA IV ertapenem ; follows OP w/ ID (Dr. Juli)  BLE wounds -Wound care consulted; apprec eval/recs   Advance Care Planning:   Code Status: Limited: Do not attempt resuscitation (DNR) -DNR-LIMITED -Do Not Intubate/DNI    Consults: N/A  Family Communication: Daughter  Severity of Illness: The appropriate patient status for this patient is INPATIENT. Inpatient status is judged to be reasonable and necessary in  order to provide the required intensity of service to ensure the patient's safety. The patient's presenting symptoms, physical exam findings, and initial radiographic and laboratory data in the context of their chronic comorbidities is felt to place them at high risk for further clinical deterioration. Furthermore, it is not anticipated that the patient will be medically stable for discharge from the hospital within 2 midnights of admission.   * I certify that at the point of admission it is my clinical judgment that the patient will require inpatient hospital care spanning beyond 2 midnights from the point of admission due to high intensity of service, high risk for further deterioration and high frequency of surveillance required.*   ------- I spent 55 minutes reviewing previous notes, at the bedside counseling/discussing the treatment plan, and performing clinical documentation.  Author: Marsha Ada, MD 04/18/2024 12:19 PM  For on call review [email or web address].

## 2024-04-18 NOTE — TOC Initial Note (Signed)
 Transition of Care [facility]) - Initial/Assessment Note    Patient Details  Name: Joshua Riley MRN: [id number] Date of Birth: 1935-09-05  Transition of Care [facility]) CM/SW Contact:    Lauraine FORBES Saa, LCSWA Phone Number: 04/18/2024, 1:46 PM  Clinical Narrative:              1:46 PM Per chart review, patient resides at home alone. Patient has a PCP and insurance. Patient has SNF history with [facility]. Patient has LTACH history with [facility]. Patient is currently active with [facility]. Patient has HH history with [facility]. Patient has DME (shower seat, cane, wheelchair walker, BSC, nebulizer machine) history with [facility]. Patient's preferred pharmacy's are [name] and [phone]. OT recommended patient discharge home with HH. TOC will continue to follow.   Expected Discharge Plan: Home w Home Health Services Barriers to Discharge: Continued Medical Work up   Patient Goals and CMS Choice            Expected Discharge Plan and Services   Discharge Planning Services: CM Consult Post Acute Care Choice: Home Health Living arrangements for the past 2 months: Apartment                                      Prior Living Arrangements/Services Living arrangements for the past 2 months: Apartment Lives with:: Self Patient language and need for interpreter reviewed:: Yes        Need for Family Participation in Patient Care: No (Comment) Care giver support system in place?: No (comment) Current home services: DME, Home OT, Home PT, Home RN Criminal Activity/Legal Involvement Pertinent to Current Situation/Hospitalization: No - Comment as needed  Activities of Daily Living   ADL Screening (condition at time of admission) Independently performs ADLs?: No Does the patient have a NEW difficulty with bathing/dressing/toileting/self-feeding that is expected to last >3 days?: Yes (Initiates electronic notice to provider for possible OT  consult) Does the patient have a NEW difficulty with getting in/out of bed, walking, or climbing stairs that is expected to last >3 days?: Yes (Initiates electronic notice to provider for possible PT consult) Does the patient have a NEW difficulty with communication that is expected to last >3 days?: Yes (Initiates electronic notice to provider for possible SLP consult) Is the patient deaf or have difficulty hearing?: Yes Does the patient have difficulty seeing, even when wearing glasses/contacts?: No Does the patient have difficulty concentrating, remembering, or making decisions?: No  Permission Sought/Granted Permission sought to share information with : Family Supports Permission granted to share information with : No (Contact information on chart)  Share Information with NAME: [name] Brooke     Permission granted to share info w Relationship: Daughter  Permission granted to share info w Contact Information: [phone]  Emotional Assessment       Orientation: : Oriented to Self, Oriented to Place, Oriented to  Time, Oriented to Situation Alcohol / Substance Use: Not Applicable Psych Involvement: No (comment)  Admission diagnosis:  Transient alteration of awareness [R40.4] Hyperkalemia [E87.5] ESRD (end stage renal disease) (HCC) [N18.6] Patient Active Problem List   Diagnosis Date Noted   ESRD (end stage renal disease) (HCC) 04/18/2024   Bilateral leg edema 04/12/2024   Obstructive uropathy    SIRS (systemic inflammatory response syndrome) (HCC) 03/14/2024   Dialysis complication 02/22/2024   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   Anastomotic leak of  intestine 07/14/2023   History of anemia due to chronic kidney disease 07/08/2023   Abscess of groin, left 07/07/2023   Macrocytic anemia 04/08/2023   Depression 04/08/2023   Abnormal bladder CT 03/23/2023   Infection due to ESBL-producing Escherichia coli 03/23/2023   DOE (dyspnea on exertion) 07/22/2022   Coag negative Staphylococcus bacteremia 06/06/2022   History of sepsis 06/02/2022   Ulcerative colitis (HCC) 06/02/2022   End-stage renal disease on hemodialysis (HCC) 05/06/2022   SBO (small bowel obstruction) (HCC) 11/18/2021   Glaucoma 11/18/2021   Paroxysmal atrial fibrillation (HCC) 11/18/2021   Orthostatic hypotension 10/22/2021   Idiopathic neuropathy 10/22/2021   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 [place] [address] Phone: [phone] Fax: [phone]  [name] [address] [place] [address] Phone: [phone] Fax: [phone]     Social Drivers of Health (SDOH) Social History: SDOH Screenings   Food Insecurity: No Food Insecurity (04/18/2024)  Housing: Low Risk  (04/18/2024)  Transportation Needs: No Transportation Needs (04/18/2024)  Utilities: Not At Risk (04/18/2024)  Alcohol Screen: Low Risk  (10/26/2023)  Depression (PHQ2-9): Medium Risk (04/07/2024)  Financial Resource Strain: Low Risk  (10/26/2023)  Physical Activity: Insufficiently Active (10/26/2023)  Social Connections: Patient Declined (04/18/2024)  Recent Concern: Social Connections - Socially Isolated (01/27/2024)  Stress: Stress Concern Present (10/26/2023)  Tobacco Use: Medium Risk (04/18/2024)   SDOH Interventions:     Readmission Risk Interventions    12/03/2023   11:30 AM 11/10/2023    3:46 PM 07/21/2023   12:26 PM  Readmission Risk Prevention Plan  Transportation Screening Complete  Complete Complete  PCP or Specialist Appt within 5-7 Days  Complete   Home Care Screening  Complete   Medication Review (RN CM)  Complete   HRI or Home Care Consult   Complete  Social Work Consult for Recovery Care Planning/Counseling   Complete  Palliative Care Screening   Not Applicable  Medication Review Oceanographer)   Complete  SW Recovery Care/Counseling Consult Complete    Palliative Care Screening Complete    Skilled Nursing Facility Complete

## 2024-04-19 ENCOUNTER — Other Ambulatory Visit (HOSPITAL_COMMUNITY): Payer: Self-pay

## 2024-04-19 ENCOUNTER — Observation Stay (HOSPITAL_COMMUNITY)

## 2024-04-19 ENCOUNTER — Encounter (HOSPITAL_COMMUNITY): Payer: Self-pay | Admitting: Hospitalist

## 2024-04-19 DIAGNOSIS — G9340 Encephalopathy, unspecified: Secondary | ICD-10-CM | POA: Diagnosis not present

## 2024-04-19 DIAGNOSIS — I951 Orthostatic hypotension: Secondary | ICD-10-CM

## 2024-04-19 DIAGNOSIS — E2740 Unspecified adrenocortical insufficiency: Secondary | ICD-10-CM | POA: Diagnosis not present

## 2024-04-19 DIAGNOSIS — L8990 Pressure ulcer of unspecified site, unspecified stage: Secondary | ICD-10-CM | POA: Insufficient documentation

## 2024-04-19 DIAGNOSIS — K51919 Ulcerative colitis, unspecified with unspecified complications: Secondary | ICD-10-CM

## 2024-04-19 DIAGNOSIS — G9341 Metabolic encephalopathy: Secondary | ICD-10-CM

## 2024-04-19 DIAGNOSIS — A498 Other bacterial infections of unspecified site: Secondary | ICD-10-CM

## 2024-04-19 DIAGNOSIS — Z936 Other artificial openings of urinary tract status: Secondary | ICD-10-CM

## 2024-04-19 DIAGNOSIS — Z992 Dependence on renal dialysis: Secondary | ICD-10-CM

## 2024-04-19 DIAGNOSIS — R600 Localized edema: Secondary | ICD-10-CM | POA: Diagnosis not present

## 2024-04-19 DIAGNOSIS — L0291 Cutaneous abscess, unspecified: Secondary | ICD-10-CM

## 2024-04-19 DIAGNOSIS — Z933 Colostomy status: Secondary | ICD-10-CM

## 2024-04-19 DIAGNOSIS — I480 Paroxysmal atrial fibrillation: Secondary | ICD-10-CM

## 2024-04-19 DIAGNOSIS — Z1612 Extended spectrum beta lactamase (ESBL) resistance: Secondary | ICD-10-CM

## 2024-04-19 LAB — BASIC METABOLIC PANEL WITH GFR
Anion gap: 17 — ABNORMAL HIGH (ref 5–15)
BUN: 48 mg/dL — ABNORMAL HIGH (ref 8–23)
CO2: 22 mmol/L (ref 22–32)
Calcium: 8.8 mg/dL — ABNORMAL LOW (ref 8.9–10.3)
Chloride: 95 mmol/L — ABNORMAL LOW (ref 98–111)
Creatinine, Ser: 5.73 mg/dL — ABNORMAL HIGH (ref 0.61–1.24)
GFR, Estimated: 9 mL/min — ABNORMAL LOW (ref >60)
Glucose, Bld: 133 mg/dL — ABNORMAL HIGH (ref 70–99)
Potassium: 5.5 mmol/L — ABNORMAL HIGH (ref 3.5–5.1)
Sodium: 134 mmol/L — ABNORMAL LOW (ref 135–145)

## 2024-04-19 MED ORDER — HYDROCORTISONE 20 MG PO TABS
20.0000 mg | ORAL_TABLET | ORAL | Status: DC
  Administered 2024-04-19 – 2024-04-26 (×8): 20 mg via ORAL
  Filled 2024-04-19 (×9): qty 1

## 2024-04-19 MED ORDER — VANCOMYCIN HCL 750 MG/150ML IV SOLN
750.0000 mg | INTRAVENOUS | Status: DC
  Filled 2024-04-19: qty 150

## 2024-04-19 MED ORDER — ZINC OXIDE 40 % EX OINT
TOPICAL_OINTMENT | Freq: Two times a day (BID) | CUTANEOUS | Status: DC
  Administered 2024-04-21 – 2024-04-24 (×4): 1 via TOPICAL
  Filled 2024-04-19: qty 57

## 2024-04-19 MED ORDER — FLUDROCORTISONE ACETATE 0.1 MG PO TABS: 0.1000 mg | ORAL_TABLET | Freq: Every day | ORAL | 0 refills | Status: DC

## 2024-04-19 MED ORDER — SODIUM CHLORIDE 0.9 % IV SOLN
1.0000 g | INTRAVENOUS | Status: DC
  Administered 2024-04-25: 1 g via INTRAVENOUS
  Filled 2024-04-19 (×3): qty 1000

## 2024-04-19 MED ORDER — HYDROCORTISONE SOD SUC (PF) 100 MG IJ SOLR
100.0000 mg | Freq: Once | INTRAMUSCULAR | Status: AC
  Administered 2024-04-19: 100 mg via INTRAVENOUS
  Filled 2024-04-19: qty 2

## 2024-04-19 MED ORDER — HYDROCORTISONE 10 MG PO TABS
10.0000 mg | ORAL_TABLET | Freq: Once | ORAL | Status: AC
  Administered 2024-04-19: 10 mg via ORAL
  Filled 2024-04-19: qty 1

## 2024-04-19 MED ORDER — TRAMADOL HCL 50 MG PO TABS
50.0000 mg | ORAL_TABLET | Freq: Two times a day (BID) | ORAL | Status: DC | PRN
Start: 2024-04-19 — End: 2024-04-26
  Administered 2024-04-19 – 2024-04-25 (×6): 50 mg via ORAL
  Filled 2024-04-19 (×6): qty 1

## 2024-04-19 MED ORDER — HYDROCORTISONE 10 MG PO TABS
10.0000 mg | ORAL_TABLET | ORAL | Status: DC
  Administered 2024-04-20 – 2024-04-26 (×7): 10 mg via ORAL
  Filled 2024-04-19 (×10): qty 1

## 2024-04-19 MED ORDER — SODIUM CHLORIDE 0.9 % IV BOLUS: 500.0000 mL | Freq: Once | INTRAVENOUS | Status: DC

## 2024-04-19 MED ORDER — SODIUM ZIRCONIUM CYCLOSILICATE 10 G PO PACK
10.0000 g | PACK | Freq: Three times a day (TID) | ORAL | Status: AC
  Administered 2024-04-19 (×2): 10 g via ORAL
  Filled 2024-04-19 (×2): qty 1

## 2024-04-19 MED ORDER — VANCOMYCIN HCL 750 MG/150ML IV SOLN
750.0000 mg | Freq: Once | INTRAVENOUS | Status: AC
  Administered 2024-04-19: 750 mg via INTRAVENOUS
  Filled 2024-04-19: qty 150

## 2024-04-19 MED ORDER — FLUDROCORTISONE ACETATE 0.1 MG PO TABS
0.1000 mg | ORAL_TABLET | Freq: Every day | ORAL | Status: DC
  Administered 2024-04-19 – 2024-04-26 (×8): 0.1 mg via ORAL
  Filled 2024-04-19 (×9): qty 1

## 2024-04-19 NOTE — Discharge Summary (Signed)
 [facility] Physician Discharge Summary   Patient name: Joshua Riley  Admit date:     04/18/2024  Discharge date: 04/19/2024  Attending Physician: GEORGINA BASKET [[id number]]  Discharge Physician: Camellia Door   PCP: Katrinka Garnette KIDD, MD  Admitted From: Home  Disposition:  Home  Recommendations for Outpatient Follow-up:  Follow up with PCP in 1-2 weeks Follow up with routine HD on M, W, F  Home Health:Yes. Home health PT, OT, RN, aide Equipment/Devices: None    Discharge Condition:Stable CODE STATUS:DNR/DNI Diet recommendation: Renal Fluid Restriction: 1500 ml/day  Hospital Summary: CC: confusion HPI: Pal Joshua Riley is a 88 y.o. 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   Pt is unable to provide a medical history (daughter unable to be reached by phone), and only relays that he presented to the ED at the request of his daughter. Per nephrology, daughter said patient was more confused than usual.  He was due for dialysis today.  Also patient fell sometime early this morning (0100-0300) and was seen by bystanders but did not want to be transported.   In the ED, the patient was tachycardic and hypotensive. Labs notable for K 5.6, Cr 8.71, lactic acid 1.3 and WBC 9.2. EDP consulted nephrology and requested medicine admission.  Significant Events: Admitted 04/18/2024 for acute  metabolic encephalopathy   Admission Labs: WBC 138, K 5.6, CO2 of 20, BUN 89, Scr 8.71, glu 115 T. Prot 6.7, alb 3.3, AST 25, ALT 17, alk phos 112, t. Bili 1.2 NH3 of 13 ETOH <15 Lipase 81 Covid/rsv/flu negative  Admission Imaging Studies: CXR Small right pleural effusion, not significantly changed .2. Mild pulmonary interstitial edema with mild central vascular congestion, improved. 3. No focal pneumonia is evident . Bilateral basal linear atelectasis.  Significant Labs:   Significant Imaging Studies:   Antibiotic Therapy: Anti-infectives (From admission, onward)    Start     Dose/Rate Route Frequency Ordered Stop   04/18/24 2200  ertapenem  (INVANZ ) 1 g in sodium chloride  0.9 % 100 mL IVPB        1 g 200 mL/hr over 30 Minutes Intravenous Every M-W-F 04/18/24 2006     04/18/24 1330  ertapenem  (INVANZ ) injection 1 g  Status:  Discontinued        1 g Intramuscular Every 24 hours 04/18/24 1318 04/18/24 2006       Procedures:   Consultants: nephrology   Hospital Course by Problem: * Acute metabolic encephalopathy 04/19/24 pt was altered yesterday on admission. Today, pt is alert and oriented x 4. Pt refuses to consider SNF. Wants to go home today. Unclear the cause. Pt has remained on IV abx since he was discharged on 04-15-2024. Not particularly uremic on admission. BUN was 89. After HD yesterday, BUN down to 48. Pt is alert and oriented x 4.   *update. Discussed case with Dr. Geralynn with nephrology. Per his exam today, pt is back to being AxOx4. Back to his neuro baseline. Nephrology has cleared him for discharge today. TOC/CM discussed with pt and he is still refusing  SNF placement. Dtr to come to hospital to pick him up later.  Orthostatic hypotension 04/19/24 chronic. Not uncommon for him to have SBP in the high 80s to 90s.  On midodrine  30 mg tid.  Prior dc summary in 03-2024 said that he was suppose to also be on Florinef  01 mg daily. Did not see that on his DC  summary from 04-15-2024. Will go ahead and restart Florinef  0.1 mg daily.  **update, per discussed with nephrology Dr. Geralynn, pt's hypotension with SBP in the 80-90s is chronic for him and stable. Pt cleared for discharge today by nephrology.  Bilateral leg edema 04/19/24 chronic. I placed ACE bandage compression wraps to both his legs.  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 On IV Ivanz and Vanco through 04-29-2024.  Saw ID in clinic on 03-31-2024.  Adrenal insufficiency 04/19/24 chronic. On hydrocortisone  bid. 20 mg qam and 10 mg qpm.  Paroxysmal atrial fibrillation (HCC) 04/19/24 stable. On Eliquis . No rate controlling meds due to his chronic hypotension. EKG from admission shows rate controlled afib. In reviewing his prior hospital notes, pt had been placed on IV amiodarone  during 11-2023 admission. Amiodarone  had to be stopped due to subsequent bradycardia.  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 Coli that was cultured from pre-sacral abscess culture from 03-16-2024.  Per last office ID note from 03-31-2024 his last dose of IV Ivanz/Vanco should be Oct 24th, 2025. Continue IV Vanz and IV Vanco. Pharmacy aware.  Ulcerative colitis (HCC) - S/p total colectomy. From his Care Everywhere records, this appears to have been performed in [place] in June 2011 04/19/24 chronic. S/p total colectomy. From his Care Everywhere records, this appears to have been performed in [place] in June 2011  End-stage renal disease on hemodialysis (HCC) 04/19/24 pt seen by nephrology. Had HD yesterday.  Nephrostomy present (HCC) - left kidney. Care Everywhere records reviewed. appears to bave been ordered by Dr. Dasie Clark in [place] . placed [phone] 04/19/24  left sided. appears to have this changed every 2-3 months.  Colostomy status (HCC) -  Care Everywhere records reviewed. States pt had total colectomy performed in June 2011 in [place]. 04/19/24 pt with ileostomy and prior total colectomy. Presumably from his hx of ulcerative colitis. His surgery was not performed at [facility]. Care Everywhere records reviewed. States pt had total colectomy performed in June 2011 in [place] .   Pressure injury of skin of multiple topographic sites 04/19/24 present on admission. Reason for Consult: LE wounds  Patient was seen by [facility] nursing team last week 10/8; has been DC and returned 10/13 for AMS   Wound type: venous ulcerations in the presence of PAD; apparently Unna's boots have been applied at some point; I did not feel comfortable placing orders last week without an ABI and known history of PAD.   Linear wounds on the left 2nd and 3rd toe; clean and dry; ok to apply xeroform every other day Black wound noted right medial 2nd toe; with history of PAD; continue to paint with betadine daily. Allow to air dry Large akin tear right arm, cleanse with saline, pat dry. Apply single layer of xeroform and change every other day and dry topper dressing.  Skin tear left elbow, skin flap present; attempt to re-aproximate skin flap and steristrip, apply single layer of xeroform gauze and dry dressing  Skin tear left bicep, cleanse with saline, pat dry. Cover with single layer of xeroform and top  with dry dressing or foam. Change every other day.  Skin tear left outer bicep; cleanse with saline, pat dry. Apply single layer of xeroform and top with dry dressing. Change every other day.  Dry lesion left shoulder blade See ostomy care below.  Last week patient had two lesions on the bilateral LEs, will have staff remove Unna's boots and verify with MD to replace.                                 Discharge Diagnoses:  Principal Problem:   Acute metabolic encephalopathy Active Problems:   Orthostatic hypotension   Colostomy status (HCC) -  Care Everywhere records reviewed. States pt had total colectomy performed in  June 2011 in [place] .   Nephrostomy present (HCC) - left kidney. Care Everywhere records reviewed. appears to bave been ordered by Dr. Dasie Clark in [place] . placed 02-2012   End-stage renal disease on hemodialysis (HCC)   Ulcerative colitis (HCC) - S/p total colectomy. 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   Paroxysmal atrial fibrillation (HCC)   Adrenal insufficiency   Presacral Abscess   Bilateral leg edema   Pressure injury of skin of multiple topographic sites   Discharge Instructions  Discharge Instructions     Call MD for:  difficulty breathing, headache or visual disturbances   Complete by: As directed    Call MD for:  extreme fatigue   Complete by: As directed    Call MD for:  hives   Complete by: As directed    Call MD for:  persistant dizziness or light-headedness   Complete by: As directed    Call MD for:  persistant nausea and vomiting   Complete by: As directed    Call MD for:  redness, tenderness, or signs of infection (pain, swelling, redness, odor or green/yellow discharge around incision site)   Complete by: As directed    Call MD for:  severe uncontrolled pain   Complete by: As directed    Call MD for:  temperature >100.4   Complete by: As directed    Diet renal with fluid restriction   Complete by: As directed    50 ounces per day fluid restriction   Discharge instructions   Complete by: As directed    1. Follow up with your primary care provider in 1-2 weeks following discharge from hospital. 2. Follow up routine dialysis on Monday, Wednesday, Friday.   Discharge wound care:   Complete by: As directed    04/19/24 0500    Wound care  Daily      Comments: Black wound noted right medial 2nd toe; with history of PAD; continue to paint with betadine daily. Allow to air dry  04/18/24 1018    04/18/24 1017    Wound care  Every other day    Comments:  1.Linear wounds on the left 2nd and 3rd toe; clean and dry; ok to apply xeroform every other day 2.         Large akin tear right arm, cleanse with saline, pat dry. Apply single layer of xeroform and change every other day and dry topper dressing.  4.         Skin tear left elbow, skin flap present; attempt to re-aproximate skin flap and steristrip, apply single layer of xeroform gauze and dry  dressing  5.         Skin tear left bicep, cleanse with saline, pat dry. Cover with single layer of xeroform and top with dry dressing or foam. Change every other day.  6.Skin tear left outer bicep; cleanse with saline, pat dry. Apply single layer of xeroform and top with dry dressing. Change every other day.   Increase activity slowly   Complete by: As directed       Allergies as of 04/19/2024       Reactions   Baclofen Other (See Comments)   Altered mental status, after accidental overdose   Cephalosporins Rash   Ciprofloxacin Itching, Rash   Wound Dressing Adhesive Rash        Medication List     TAKE these medications    atorvastatin  20 MG tablet Commonly known as: LIPITOR Take 1 tablet (20 mg total) by mouth daily.   calcitRIOL  0.5 MCG capsule Commonly known as: ROCALTROL  Take 1 capsule (0.5 mcg total) by mouth every Monday, Wednesday, and Friday with hemodialysis.   dorzolamide  2 % ophthalmic solution Commonly known as: TRUSOPT  Place 1 drop into the right eye at bedtime.   Eliquis  2.5 MG Tabs tablet Generic drug: apixaban  Take 1 tablet (2.5 mg total) by mouth 2 (two) times daily.   ertapenem  1 g in sodium chloride  0.9 % 100 mL Inject 1 g into the vein every Monday, Wednesday, and Friday at 6 PM.   fludrocortisone  0.1 MG tablet Commonly known as: FLORINEF  Take 1 tablet (0.1 mg total) by mouth daily.   folic acid  1 MG tablet Commonly known as: FOLVITE  Take 1 tablet (1 mg total) by mouth daily.   gabapentin  300 MG capsule Commonly known as: NEURONTIN  TAKE 1 CAPSULE BY  MOUTH ONCE A DAY AT BEDTIME FOR NEUROPATHY   hydrocortisone  10 MG tablet Commonly known as: CORTEF  Take 1 tablet (10 mg total) by mouth every evening.   hydrocortisone  20 MG tablet Commonly known as: CORTEF  Take 1 tablet (20 mg total) by mouth daily.   midodrine  10 MG tablet Commonly known as: PROAMATINE  Take 3 tablets (30 mg total) by mouth 3 (three) times daily.   mometasone -formoterol  200-5 MCG/ACT Aero Commonly known as: DULERA  Inhale 2 puffs into the lungs 2 (two) times daily.   multivitamin Tabs tablet Take 1 tablet by mouth daily.   Omega-3 Gummies Chew Chew 1,200 mg by mouth in the morning and at bedtime.   pantoprazole  40 MG tablet Commonly known as: PROTONIX  Take 1 tablet (40 mg total) by mouth 2 (two) times daily. What changed:  when to take this reasons to take this   ramelteon  8 MG tablet Commonly known as: ROZEREM  Take 1 tablet (8 mg total) by mouth at bedtime.   Saline Flush 0.9 % Soln Use 5 mLs by Intracatheter route daily as directed.   timolol  0.5 % ophthalmic solution Commonly known as: TIMOPTIC  Place 1 drop into both eyes in the morning and at bedtime.   traMADol  50 MG tablet Commonly known as: ULTRAM  Take 1 tablet (50 mg total) by mouth every 12 (twelve) hours as needed (for pain).   Tylenol  325 MG tablet Generic drug: acetaminophen  Take 325-650 mg by mouth every 6 (six) hours as needed for mild pain (pain score 1-3) or headache.   VITAMIN B-12 PO Take 1 capsule by mouth in the morning and at bedtime.               Discharge Care Instructions  (From admission,  onward)           Start     Ordered   04/19/24 0000  Discharge wound care:       Comments: 04/19/24 0500    Wound care  Daily      Comments: Black wound noted right medial 2nd toe; with history of PAD; continue to paint with betadine daily. Allow to air dry  04/18/24 1018    04/18/24 1017    Wound care  Every other day    Comments: 1.Linear wounds on the left 2nd  and 3rd toe; clean and dry; ok to apply xeroform every other day 2.         Large akin tear right arm, cleanse with saline, pat dry. Apply single layer of xeroform and change every other day and dry topper dressing.  4.         Skin tear left elbow, skin flap present; attempt to re-aproximate skin flap and steristrip, apply single layer of xeroform gauze and dry dressing  5.         Skin tear left bicep, cleanse with saline, pat dry. Cover with single layer of xeroform and top with dry dressing or foam. Change every other day.  6.Skin tear left outer bicep; cleanse with saline, pat dry. Apply single layer of xeroform and top with dry dressing. Change every other day.   04/19/24 1220            Allergies  Allergen Reactions   Baclofen Other (See Comments)    Altered mental status, after accidental overdose     Cephalosporins Rash   Ciprofloxacin Itching and Rash   Wound Dressing Adhesive Rash    Discharge Exam: Vitals:   04/19/24 0724 04/19/24 0929  BP:  (!) 85/48  Pulse:  72  Resp:  (!) 22  Temp:  97.9 F (36.6 C)  SpO2: 97% 100%    Physical Exam Vitals and nursing note reviewed.  Constitutional:      General: He is not in acute distress.    Appearance: He is not toxic-appearing.     Comments: Appears chronically ill  HENT:     Head: Normocephalic and atraumatic.  Cardiovascular:     Rate and Rhythm: Normal rate and regular rhythm.  Pulmonary:     Effort: Pulmonary effort is normal.     Breath sounds: Normal breath sounds.  Abdominal:     General: Abdomen is flat. Bowel sounds are normal.  Skin:    General: Skin is warm.     Capillary Refill: Capillary refill takes less than 2 seconds.     Comments: See pictures of wounds in media tab. Multiple wounds of arms/legs  Neurological:     Mental Status: He is alert and oriented to person, place, and time.     The results of significant diagnostics from this hospitalization (including imaging, microbiology,  ancillary and laboratory) are listed below for reference.    Microbiology: Recent Results (from the past 240 hours)  Blood Culture (routine x 2)     Status: None   Collection Time: 04/11/24 10:12 PM   Specimen: BLOOD  Result Value Ref Range Status   Specimen Description   Final    BLOOD LEFT ANTECUBITAL Performed at [facility], [address] [place], [place] [address]    Special Requests   Final    BOTTLES DRAWN AEROBIC AND ANAEROBIC Blood Culture adequate volume Performed at [facility], [address], [place], [place] [address]  Culture   Final    NO GROWTH 5 DAYS Performed at [facility], [address] [place], [place] [address]    Report Status 04/17/2024 FINAL  Final  Blood Culture (routine x 2)     Status: None   Collection Time: 04/12/24  8:30 PM   Specimen: BLOOD RIGHT ARM  Result Value Ref Range Status   Specimen Description BLOOD RIGHT ARM  Final   Special Requests   Final    BOTTLES DRAWN AEROBIC AND ANAEROBIC Blood Culture adequate volume   Culture   Final    NO GROWTH 5 DAYS Performed at [facility], [address] [place], [place] [address]    Report Status 04/17/2024 FINAL  Final  Culture, blood (routine x 2)     Status: None (Preliminary result)   Collection Time: 04/18/24  6:01 AM   Specimen: BLOOD  Result Value Ref Range Status   Specimen Description BLOOD SITE NOT SPECIFIED  Final   Special Requests   Final    BOTTLES DRAWN AEROBIC AND ANAEROBIC Blood Culture results may not be optimal due to an inadequate volume of blood received in culture bottles   Culture   Final    NO GROWTH 1 DAY Performed at [facility], [address], [place], [place] [address]    Report Status PENDING  Incomplete  Resp panel by RT-PCR (RSV, Flu A&B, Covid) Anterior Nasal Swab     Status: None   Collection Time: 04/18/24  6:01 AM   Specimen: Anterior Nasal Swab  Result Value Ref Range Status   SARS  Coronavirus 2 by RT PCR NEGATIVE NEGATIVE Final   Influenza A by PCR NEGATIVE NEGATIVE Final   Influenza B by PCR NEGATIVE NEGATIVE Final    Comment: (NOTE) The Xpert Xpress SARS-CoV-2/FLU/RSV plus assay is intended as an aid in the diagnosis of influenza from Nasopharyngeal swab specimens and should not be used as a sole basis for treatment. Nasal washings and aspirates are unacceptable for Xpert Xpress SARS-CoV-2/FLU/RSV testing.  Fact Sheet for Patients: BloggerCourse.com  Fact Sheet for Healthcare Providers: SeriousBroker.it  This test is not yet approved or cleared by the [place]  FDA and has been authorized for detection and/or diagnosis of SARS-CoV-2 by FDA under an Emergency Use Authorization (EUA). This EUA will remain in effect (meaning this test can be used) for the duration of the COVID-19 declaration under Section 564(b)(1) of the Act, 21 U.S.C. section 360bbb-3(b)(1), unless the authorization is terminated or revoked.     Resp Syncytial Virus by PCR NEGATIVE NEGATIVE Final    Comment: (NOTE) Fact Sheet for Patients: BloggerCourse.com  Fact Sheet for Healthcare Providers: SeriousBroker.it  This test is not yet approved or cleared by the [place]  FDA and has been authorized for detection and/or diagnosis of SARS-CoV-2 by FDA under an Emergency Use Authorization (EUA). This EUA will remain in effect (meaning this test can be used) for the duration of the COVID-19 declaration under Section 564(b)(1) of the Act, 21 U.S.C. section 360bbb-3(b)(1), unless the authorization is terminated or revoked.  Performed at [facility], [address] [place], [place] [address]   Culture, blood (routine x 2)     Status: None (Preliminary result)   Collection Time: 04/18/24  6:06 AM   Specimen: BLOOD  Result Value Ref Range Status   Specimen Description BLOOD  SITE NOT SPECIFIED  Final   Special Requests   Final    BOTTLES DRAWN  AEROBIC AND ANAEROBIC Blood Culture adequate volume   Culture   Final    NO GROWTH 1 DAY Performed at [facility], [address], [place], [place] [address]    Report Status PENDING  Incomplete     Labs: Basic Metabolic Panel: Recent Labs  Lab 04/13/24 0243 04/14/24 1127 04/15/24 0213 04/18/24 0558 04/18/24 0725 04/19/24 0413  NA 140 136 134* 138  --  134*  K 4.8 4.6 5.3* 5.6*  --  5.5*  CL 103 101 97* 100  --  95*  CO2 22 17* 21* 20*  --  22  GLUCOSE 110* 83 140* 115*  --  133*  BUN 60* 59* 75* 89*  --  48*  CREATININE 7.28* 6.45* 7.82* 8.71* 8.76* 5.73*  CALCIUM  9.2 8.7* 8.8* 9.8  --  8.8*   Liver Function Tests: Recent Labs  Lab 04/18/24 0558  AST 25  ALT 17  ALKPHOS 112  BILITOT 1.2  PROT 6.7  ALBUMIN  3.3*   Recent Labs  Lab 04/18/24 0558  LIPASE 81*   Recent Labs  Lab 04/18/24 0615  AMMONIA 13   CBC: Recent Labs  Lab 04/13/24 0243 04/14/24 1127 04/15/24 0213 04/18/24 0558 04/18/24 0725  WBC 6.9 7.2 8.5 9.3 9.2  NEUTROABS  --   --   --  7.9*  --   HGB 10.2* 10.6* 10.1* 10.5* 10.0*  HCT 32.1* 32.9* 31.4* 32.9* 31.6*  MCV 116.3* 116.7* 116.7* 116.3* 116.6*  PLT 155 127* 133* 123* 120*     Sepsis Labs Recent Labs  Lab 04/14/24 1127 04/15/24 0213 04/18/24 0558 04/18/24 0725  WBC 7.2 8.5 9.3 9.2    Procedures/Studies: DG CHEST PORT 1 VIEW Result Date: 04/19/2024 EXAM: 1 VIEW(S) XRAY OF THE CHEST 04/19/2024 01:01:33 AM COMPARISON: Portable chest x-ray 04/11/2024. CLINICAL HISTORY: 201257 ESRD (end stage renal disease) (HCC) [id number]. Shob/ low Oxygen levels. FINDINGS: LINES, TUBES AND DEVICES: A right subclavian venous stent is again shown. LUNGS AND PLEURA: No focal pulmonary opacity. Slight interstitial edema in the lung bases, findings improved. There is a small right pleural effusion. Linear atelectasis in both lung bases. No focal pneumonia is seen. No  pneumothorax. HEART AND MEDIASTINUM: There is mild cardiomegaly. The mediastinum is stable with mild aortic tortuosity and atherosclerosis. There is mild prominence in the central vasculature, improved. BONES AND SOFT TISSUES: There is osteopenia with thoracic spondylosis. No acute osseous abnormality. IMPRESSION: 1. Small right pleural effusion, not significantly changed . 2. Mild pulmonary interstitial edema with mild central vascular congestion, improved. 3. No focal pneumonia is evident . Bilateral basal linear atelectasis. Electronically signed by: Francis Quam MD 04/19/2024 01:13 AM EDT RP Workstation: [id number]   CT Head Wo Contrast Result Date: 04/18/2024 EXAM: CT HEAD WITHOUT CONTRAST 04/18/2024 06:34:38 AM TECHNIQUE: CT of the head was performed without the administration of intravenous contrast. Automated exposure control, iterative reconstruction, and/or weight based adjustment of the mA/kV was utilized to reduce the radiation dose to as low as reasonably achievable. COMPARISON: Brain MRI 11/29/2023, head CT 11/28/2023. CLINICAL HISTORY: 88 year old male with mental status change, unknown cause (AMS). FINDINGS: BRAIN AND VENTRICLES: No acute hemorrhage. No evidence of acute infarct. No hydrocephalus. No extra-axial collection. No mass effect or midline shift. Cerebral volume is stable and within normal limits for age. Very mild for age chronic periventricular white matter hypodensity is stable. Calcified atherosclerosis at the skull base. No suspicious intracranial vascular hyperdensity. ORBITS: Postoperative changes to both globes. SINUSES: Mild paranasal sinus mucosal  thickening. SOFT TISSUES AND SKULL: Chronic calcified scalp vessel atherosclerosis. No acute soft tissue abnormality. No skull fracture. IMPRESSION: 1. No acute intracranial abnormality. 2. Negative for age non-contrast CT appearance of the brain. Electronically signed by: Helayne Hurst MD 04/18/2024 06:47 AM EDT RP Workstation:  [id number]   US  Venous Img Lower Bilateral Result Date: 04/12/2024 CLINICAL DATA:  Bilateral leg swelling EXAM: BILATERAL LOWER EXTREMITY VENOUS DOPPLER ULTRASOUND TECHNIQUE: Gray-scale sonography with graded compression, as well as color Doppler and duplex ultrasound were performed to evaluate the lower extremity deep venous systems from the level of the common femoral vein and including the common femoral, femoral, profunda femoral, popliteal and calf veins including the posterior tibial, peroneal and gastrocnemius veins when visible. The superficial great saphenous vein was also interrogated. Spectral Doppler was utilized to evaluate flow at rest and with distal augmentation maneuvers in the common femoral, femoral and popliteal veins. COMPARISON:  None Available. FINDINGS: RIGHT LOWER EXTREMITY Common Femoral Vein: No evidence of thrombus. Normal compressibility, respiratory phasicity and response to augmentation. Saphenofemoral Junction: No evidence of thrombus. Normal compressibility and flow on color Doppler imaging. Profunda Femoral Vein: No evidence of thrombus. Normal compressibility and flow on color Doppler imaging. Femoral Vein: No evidence of thrombus. Normal compressibility, respiratory phasicity and response to augmentation. Popliteal Vein: No evidence of thrombus. Normal compressibility, respiratory phasicity and response to augmentation. Calf Veins: No evidence of thrombus. Normal compressibility and flow on color Doppler imaging. Superficial Great Saphenous Vein: No evidence of thrombus. Normal compressibility and flow on color Doppler imaging. Venous Reflux:  None. Other Findings:  Mild edema is noted. LEFT LOWER EXTREMITY Common Femoral Vein: No evidence of thrombus. Normal compressibility, respiratory phasicity and response to augmentation. Saphenofemoral Junction: No evidence of thrombus. Normal compressibility and flow on color Doppler imaging. Profunda Femoral Vein: No evidence of  thrombus. Normal compressibility and flow on color Doppler imaging. Femoral Vein: No evidence of thrombus. Normal compressibility, respiratory phasicity and response to augmentation. Popliteal Vein: No evidence of thrombus. Normal compressibility, respiratory phasicity and response to augmentation. Calf Veins: No evidence of thrombus. Normal compressibility and flow on color Doppler imaging. Superficial Great Saphenous Vein: No evidence of thrombus. Normal compressibility and flow on color Doppler imaging. Venous Reflux:  None. Other Findings:  Mild edema is noted. IMPRESSION: No evidence of deep venous thrombosis. Electronically Signed   By: Oneil Devonshire M.D.   On: 04/12/2024 00:55   CT ABDOMEN PELVIS WO CONTRAST Result Date: 04/11/2024 CLINICAL DATA:  Recent fall and findings suspicious for sepsis EXAM: CT ABDOMEN AND PELVIS WITHOUT CONTRAST TECHNIQUE: Multidetector CT imaging of the abdomen and pelvis was performed following the standard protocol without IV contrast. RADIATION DOSE REDUCTION: This exam was performed according to the departmental dose-optimization program which includes automated exposure control, adjustment of the mA and/or kV according to patient size and/or use of iterative reconstruction technique. COMPARISON:  04/04/2024 FINDINGS: Lower chest: Large right-sided pleural effusion is again noted. Vascular congestion is again seen. Hepatobiliary: Cholelithiasis without complicating factors. The liver is within normal limits. Pancreas: Unremarkable. No pancreatic ductal dilatation or surrounding inflammatory changes. Spleen: Normal in size without focal abnormality. Adrenals/Urinary Tract: Adrenal glands are within normal limits. Kidneys are atrophic consistent with the known end-stage renal disease. Scattered cysts are noted bilaterally. No follow-up is recommended. Nonobstructing stone is seen in the lower pole of the right kidney. Left-sided nephrostomy catheter is noted and stable. No  ureteral stones are seen. The bladder is decompressed. Stomach/Bowel: There are changes consistent  with right-sided ileostomy and prior total colectomy. These are stable in appearance. No small bowel obstructive changes are seen. The stomach is within normal limits. Drainage catheter is seen in the presacral region. Vascular/Lymphatic: Aortic atherosclerosis. No enlarged abdominal or pelvic lymph nodes. Left AV graft is noted. Reproductive: Prostate is unremarkable.  Penile prosthesis is noted. Other: No abdominal wall hernia or abnormality. No abdominopelvic ascites. Musculoskeletal: L5 pars defects are noted with grade 1 anterolisthesis. IMPRESSION: Stable right-sided pleural effusion. Cholelithiasis without complicating factors. Stable right renal calculi.  Stable renal atrophy. Electronically Signed   By: Oneil Devonshire M.D.   On: 04/11/2024 23:08   DG Chest 2 View Result Date: 04/11/2024 CLINICAL DATA:  Follow-up pleural effusion EXAM: CHEST - 2 VIEW COMPARISON:  03/14/2024 FINDINGS: Cardiac shadow is stable. Mild vascular congestion is again noted. Small right pleural effusion is seen similar to that noted on the prior exam. No new focal infiltrate is seen. Right subclavian vein stent is noted. IMPRESSION: Mild vascular congestion with stable small right pleural effusion. Electronically Signed   By: Oneil Devonshire M.D.   On: 04/11/2024 21:36   DG Chest Port 1 View Result Date: 04/11/2024 CLINICAL DATA:  Possible sepsis EXAM: PORTABLE CHEST 1 VIEW COMPARISON:  04/07/2024 FINDINGS: Cardiac shadow is prominent but accentuated by the portable technique. Thoracic aorta is tortuous. Right subclavian vein stent is noted. Mild central vascular congestion is noted with interstitial edema slightly improved when compared with the prior exam. Small right-sided pleural effusion is noted. Basilar atelectasis is seen as well on the right. IMPRESSION: Mild congestive failure. Right basilar atelectasis and associated  effusion. Electronically Signed   By: Oneil Devonshire M.D.   On: 04/11/2024 21:35   DG Sinus/Fist Tube Chk-Non GI Result Date: 04/04/2024 CLINICAL DATA:  Patient with a history of chronic presacral abscess with most recent drain placement in IR 03/16/2024. EXAM: ABSCESS INJECTION COMPARISON:  None Available. CONTRAST:  7 ml Isovue  300 - administered via the existing percutaneous drain. FLUOROSCOPY TIME:  Radiation exposure index as provided by the fluoroscopic device: 4.8 mGy kerma TECHNIQUE: The patient was positioned prone on the fluoroscopy table. A pre-procedural spot fluoroscopic image was obtained of the presacral/pelvic abscess and the existing percutaneous drainage catheter. Multiple spot fluoroscopic and radiographic images were obtained following the injection of a small amount of contrast via the existing percutaneous drainage catheter. FINDINGS: Contrast injection showed a possible fistula to the bowel. IMPRESSION: Possible fistula to the bowel and the drain will be left in place. The patient will return to the clinic in approximately 3 weeks for repeat CT scan and drain injection. Electronically Signed   By: Ester Sides M.D.   On: 04/04/2024 15:23   CT ABDOMEN PELVIS W CONTRAST Result Date: 04/04/2024 CLINICAL DATA:  88 year old male with history of recurrent presacral abscess status post drain placement on 03/16/2024. EXAM: CT ABDOMEN AND PELVIS WITH CONTRAST TECHNIQUE: Multidetector CT imaging of the abdomen and pelvis was performed using the standard protocol following bolus administration of intravenous contrast. RADIATION DOSE REDUCTION: This exam was performed according to the departmental dose-optimization program which includes automated exposure control, adjustment of the mA and/or kV according to patient size and/or use of iterative reconstruction technique. CONTRAST:  ISOVUE -300 IOPAMIDOL  (ISOVUE -300) INJECTION 61% COMPARISON:  03/16/2024, 03/14/2024 FINDINGS: Lower chest: Similar  appearing small right pleural effusion with associated right basilar passive atelectasis. The heart is normal in size without significant pericardial effusion. Hepatobiliary: No focal liver abnormality is seen. Unchanged cholelithiasis without  inflammatory changes. No biliary dilatation. Pancreas: Unremarkable. No pancreatic ductal dilatation or surrounding inflammatory changes. Spleen: Normal in size without focal abnormality. Adrenals/Urinary Tract: Adrenal glands are unremarkable. Well-positioned in unchanged appearance of indwelling left percutaneous nephrostomy tube. Kidneys are chronically atrophic, without renal calculi, focal lesion, or hydronephrosis. Bladder is unremarkable. Stomach/Bowel: Stomach is within normal limits. 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 No evidence of bowel wall thickening, distention, or inflammatory changes. Vascular/Lymphatic: Aortic atherosclerosis. Partially visualized left femoral arteriovenous graft which appears patent. No enlarged abdominal or pelvic lymph nodes. Reproductive: Prostate is unremarkable. Penile prosthetic pump partially visualized. Other: Unchanged position of indwelling left transgluteal pigtail drainage catheter with trace residual gas containing fluid collection just superior to the pigtail portion of the catheter. No new fluid collections or ascites. Musculoskeletal: No acute osseous abnormality. Unchanged bilateral L5 pars defects with unchanged mild anterolisthesis of L5 on S1. IMPRESSION: 1. Unchanged position of indwelling left transgluteal pigtail drainage catheter with trace residual gas containing fluid collection just superior to the pigtail portion of the catheter. No new fluid collections or ascites. 2. Well-positioned and unchanged appearance of indwelling left percutaneous nephrostomy tube. 3. Similar appearing small right pleural effusion with associated right basilar passive  atelectasis. 4. Cholelithiasis without evidence of cholecystitis. 5.  Aortic Atherosclerosis (ICD10-I70.0). Ester Sides, MD [facility] Radiology Electronically Signed   By: Ester Sides M.D.   On: 04/04/2024 14:24   IR Radiologist Eval & Mgmt Result Date: 04/04/2024 EXAM: See EPIC note CHIEF COMPLAINT: See EPIC note HISTORY OF PRESENT ILLNESS: See EPIC note REVIEW OF SYSTEMS: See EPIC note PHYSICAL EXAMINATION: See EPIC note ASSESSMENT AND PLAN: See EPIC note Electronically Signed   By: Ester Sides M.D.   On: 04/04/2024 13:57    Time coordinating discharge: 60 mins  SIGNED:  Camellia Door, DO [facility] 04/19/24, 12:26 PM

## 2024-04-19 NOTE — Assessment & Plan Note (Addendum)
 04/19/24 remains on IV Invanz  due to ESBL E. Coli that was cultured from pre-sacral abscess culture from 03-16-2024.  Per last office ID note from 03-31-2024 his last dose of IV Ivanz/Vanco should be Oct 24th, 2025. Continue IV Vanz and IV Vanco. Pharmacy aware.

## 2024-04-19 NOTE — Assessment & Plan Note (Addendum)
 04/19/24 pt seen by nephrology. Had HD yesterday.

## 2024-04-19 NOTE — TOC CM/SW Note (Signed)
 RE: Joshua Riley   Date of Birth: [birth year] Date: 04/19/2024  Please be advised that the above-named patient will require a short-term nursing home stay - anticipated 30 days or less for rehabilitation and strengthening.  The plan is for return home.  Lauraine Saa, MSW, LCSW-A Transitions of Care  Clinical Social Worker I [phone]

## 2024-04-19 NOTE — Assessment & Plan Note (Addendum)
 04/19/24 pt was altered yesterday on admission. Today, pt is alert and oriented x 4. Pt refuses to consider SNF. Wants to go home today. Unclear the cause. Pt has remained on IV abx since he was discharged on 04-15-2024. Not particularly uremic on admission. BUN was 89. After HD yesterday, BUN down to 48. Pt is alert and oriented x 4.   *update. Discussed case with Dr. Geralynn with nephrology. Per his exam today, pt is back to being AxOx4. Back to his neuro baseline. Nephrology has cleared him for discharge today. TOC/CM discussed with pt and he is still refusing SNF placement. Dtr to come to hospital to pick him up later.

## 2024-04-19 NOTE — TOC Progression Note (Addendum)
 Transition of Care [facility]) - Progression Note    Patient Details  Name: Joshua Riley MRN: [id number] Date of Birth: [birth year]  Transition of [facility] CM/SW Contact  Lauraine FORBES Saa, LCSWA Phone Number: 04/19/2024, 4:04 PM  Clinical Narrative:     4:04 PM CSW made aware that patient is now agreeable with SNF, had three night Medicare qualifying stay, and [facility] waiver. CSW sent patient's FL2 out to SNFs. Patient's PASRR is currently pending additional clinicals. CSW to submit additional clinicals for PASRR review. CSW will continue to follow.  Expected Discharge Plan: Skilled Nursing Facility Barriers to Discharge: Continued Medical Work up               Expected Discharge Plan and Services In-house Referral: Clinical Social Work Discharge Planning Services: CM Consult Post Acute Care Choice: Skilled Nursing Facility Living arrangements for the past 2 months: Apartment Expected Discharge Date: 04/19/24                                     Social Drivers of Health (SDOH) Interventions SDOH Screenings   Food Insecurity: No Food Insecurity (04/18/2024)  Housing: Low Risk  (04/18/2024)  Transportation Needs: No Transportation Needs (04/18/2024)  Utilities: Not At Risk (04/18/2024)  Alcohol Screen: Low Risk  (10/26/2023)  Depression (PHQ2-9): Medium Risk (04/07/2024)  Financial Resource Strain: Low Risk  (10/26/2023)  Physical Activity: Insufficiently Active (10/26/2023)  Social Connections: Patient Declined (04/18/2024)  Recent Concern: Social Connections - Socially Isolated (01/27/2024)  Stress: Stress Concern Present (10/26/2023)  Tobacco Use: Medium Risk (04/18/2024)    Readmission Risk Interventions    12/03/2023   11:30 AM 11/10/2023    3:46 PM 07/21/2023   12:26 PM  Readmission Risk Prevention Plan  Transportation Screening Complete Complete Complete  PCP or Specialist Appt within 5-7 Days  Complete   Home Care Screening  Complete   Medication Review (RN  CM)  Complete   HRI or Home Care Consult   Complete  Social Work Consult for Recovery Care Planning/Counseling   Complete  Palliative Care Screening   Not Applicable  Medication Review Oceanographer)   Complete  SW Recovery Care/Counseling Consult Complete    Palliative Care Screening Complete    Skilled Nursing Facility Complete

## 2024-04-19 NOTE — Assessment & Plan Note (Addendum)
 04/19/24 chronic. S/p total colectomy. From his Care Everywhere records, this appears to have been performed in [place] in June 2011

## 2024-04-19 NOTE — Assessment & Plan Note (Addendum)
 04/19/24 present on admission. Reason for Consult: LE wounds  Patient was seen by [facility] nursing team last week 10/8; has been DC and returned 10/13 for AMS   Wound type: venous ulcerations in the presence of PAD; apparently Unna's boots have been applied at some point; I did not feel comfortable placing orders last week without an ABI and known history of PAD.   Linear wounds on the left 2nd and 3rd toe; clean and dry; ok to apply xeroform every other day Black wound noted right medial 2nd toe; with history of PAD; continue to paint with betadine daily. Allow to air dry Large akin tear right arm, cleanse with saline, pat dry. Apply single layer of xeroform and change every other day and dry topper dressing.  Skin tear left elbow, skin flap present; attempt to re-aproximate skin flap and steristrip, apply single layer of xeroform gauze and dry dressing  Skin tear left bicep, cleanse with saline, pat dry. Cover with single layer of xeroform and top with dry dressing or foam. Change every other day.  Skin tear left outer bicep; cleanse with saline, pat dry. Apply single layer of xeroform and top with dry dressing. Change every other day.  Dry lesion left shoulder blade See ostomy care below.  Last week patient had two lesions on the bilateral LEs, will have staff remove Unna's boots and verify with MD to replace.

## 2024-04-19 NOTE — Progress Notes (Signed)
 MEWS Progress Note  Patient Details Name: Joshua Riley MRN: [id number] DOB: 04/26/1936 Today's Date: 04/19/2024   MEWS Flowsheet Documentation:  Assess: MEWS Score Temp: (!) 97.5 F (36.4 C) BP: (!) 82/47 MAP (mmHg): (!) 57 Pulse Rate: (!) 116 ECG Heart Rate: 85 Resp: 20 Level of Consciousness: Alert SpO2: 95 % O2 Device: Room Air Patient Activity (if Appropriate): In bed O2 Flow Rate (L/min): 2 L/min Assess: MEWS Score MEWS Temp: 0 MEWS Systolic: 1 MEWS Pulse: 2 MEWS RR: 0 MEWS LOC: 0 MEWS Score: 3 MEWS Score Color: Yellow Assess: SIRS CRITERIA SIRS Temperature : 0 SIRS Respirations : 0 SIRS Pulse: 1 SIRS WBC: 0 SIRS Score Sum : 1 SIRS Temperature : 0 SIRS Pulse: 1 SIRS Respirations : 0 SIRS WBC: 0 SIRS Score Sum : 1 Assess: if the MEWS score is Yellow or Red Were vital signs accurate and taken at a resting state?: Yes Does the patient meet 2 or more of the SIRS criteria?: No MEWS guidelines implemented : Yes, yellow Treat MEWS Interventions: Considered administering scheduled or prn medications/treatments as ordered Take Vital Signs Increase Vital Sign Frequency : Yellow: Q2hr x1, continue Q4hrs until patient remains green for 12hrs Escalate MEWS: Escalate: Yellow: Discuss with charge nurse and consider notifying provider and/or RRT      Pt's afternoon midodrine  dose was not given on day shift- hypotension was result; BP 82/47 MAP 57 HR 116; PM dose of midodrine  given to patient early. Per orders midodrine  is okay to be given stat/one time orders for acute hypotension.   Ginny PARAS Johnnye Sandford,RN 04/19/2024, 9:30 PM

## 2024-04-19 NOTE — Significant Event (Signed)
 Patient hypotensive, has hx of adrenal insufficiency and is on Hydrocortisone  20 mg AM / 10 mg PM outpatient. Concern adrenal insufficiency as preciptating cause. Give hydrocortisone  100 mg IV x 1, and resume his home hydrocortisone  in AM. May instead chose to continue stress dose steroids if his BP is not responding.   Joshua Dawson, MD  [facility]

## 2024-04-19 NOTE — Care Management Obs Status (Signed)
 MEDICARE OBSERVATION STATUS NOTIFICATION   Patient Details  Name: Joshua Riley MRN: [id number] Date of Birth: 1936/01/20   Medicare Observation Status Notification Given:  Yes Verbally reviewed observation notice with [name] Brooke telephonically at [phone].    Teghan Philbin 04/19/2024, 8:02 AM

## 2024-04-19 NOTE — Assessment & Plan Note (Addendum)
 04/19/24 chronic. On hydrocortisone  bid. 20 mg qam and 10 mg qpm.

## 2024-04-19 NOTE — Progress Notes (Signed)
 AVS with nurse to  go over with family when they arrive later

## 2024-04-19 NOTE — Assessment & Plan Note (Addendum)
 04/19/24  left sided. appears to have this changed every 2-3 months.

## 2024-04-19 NOTE — Subjective & Objective (Signed)
 Pt seen and examined. Very complex history. Multiple admissions since jan 2025. I count 9 admission including this one.  Pt adamant he wants to go home. Is AxOx4.

## 2024-04-19 NOTE — Evaluation (Signed)
 Physical Therapy Evaluation Patient Details Name: Joshua Riley MRN: [id number] DOB: [birth year] Today's Date: 04/19/2024  History of Present Illness  88 y.o. male presents  to ED via EMS for altered mental status and fall.  Recent hospital admission 10/6 to 10/10 for  BLE edema and volume overload. PMH - ESRD on HD MWF, afib, CVA, depression, colectomy, ileostomy, chronic hypotension, DVT, acute pericarditis, groin abscess with lt transgluteal drain, nephrostomy tubes, chf, copd.  Clinical Impression   Pt admitted with above diagnosis. Lives at home alone, in a single-level home with a level entry; Prior to admission, pt was able to manage independently, driving; Does have a history of falls; Presents to PT with generalized weakness, bil LE/feet with neuropathy, incr fall risk; Pt does have other medical needs to manage including colostomy, L transgluteal drain, nephrostomy tubes; Noting family is concerned for his ability to take care of himself; We discussed options for dc, and at time of discussion, pt agrees to short-term SNF for rehab to maximize independence and safety with mobility and ADLs;  Pt currently with functional limitations due to the deficits listed below (see PT Problem List). Pt will benefit from skilled PT to increase their independence and safety with mobility to allow discharge to the venue listed below.       If ultimately pt does not choose to go to SNF for rehab, recommend maximizing [facility] services      If plan is discharge home, recommend the following: A little help with walking and/or transfers;A little help with bathing/dressing/bathroom;Assistance with cooking/housework;Assist for transportation;Help with stairs or ramp for entrance   Can travel by private vehicle        Equipment Recommendations None recommended by PT  Recommendations for Other Services       Functional Status Assessment Patient has had a recent decline in their functional status and  demonstrates the ability to make significant improvements in function in a reasonable and predictable amount of time.     Precautions / Restrictions Precautions Precautions: Fall Recall of Precautions/Restrictions: Intact Precaution/Restrictions Comments: chronic hypotension; ileostomyl gluteal drain; nephrostomy Restrictions Weight Bearing Restrictions Per Provider Order: No      Mobility  Bed Mobility                    Transfers Overall transfer level: Needs assistance Equipment used: Rolling walker (2 wheels) Transfers: Sit to/from Stand Sit to Stand: Supervision           General transfer comment: Smooth rise with good hand placement    Ambulation/Gait Ambulation/Gait assistance: Contact guard assist, Min assist (with and without physical contact) Gait Distance (Feet): 50 Feet (at least, with hallway ambulation and in room ambulation) Assistive device: Rolling walker (2 wheels) Gait Pattern/deviations: Step-through pattern, Trunk flexed       General Gait Details: slowed step-through gait, increased trunk flexion over RW; able to correct with cues, including min assist for RW proximity and management  Stairs            Wheelchair Mobility     Tilt Bed    Modified Rankin (Stroke Patients Only)       Balance     Sitting balance-Leahy Scale: Good       Standing balance-Leahy Scale: Poor Standing balance comment: increased difficulty with turns, needing closer guard                             Pertinent Vitals/Pain  Pain Assessment Pain Assessment: Faces Faces Pain Scale: No hurt Pain Intervention(s): Monitored during session    Home Living Family/patient expects to be discharged to:: Private residence Living Arrangements: Alone Available Help at Discharge: Family;Available PRN/intermittently Type of Home: Apartment Home Access: Level entry       Home Layout: One level Home Equipment: Wheelchair - Geophysicist/field seismologist (2 wheels);Rollator (4 wheels);Shower seat;Hand held shower head;Grab bars - tub/shower Additional Comments: daughter lives 20 minutes away, aide services weekdays from 12-4PM    Prior Function Prior Level of Function : Independent/Modified Independent;Driving;History of Falls (last six months)             Mobility Comments: ambulatory with PRN use of SPC, RW or rollator. ADLs Comments: has aide 12-4 to help with groceries, laundry, bathing M-F     Extremity/Trunk Assessment   Upper Extremity Assessment Upper Extremity Assessment: Defer to OT evaluation (Multiple skin tears, abrasions)    Lower Extremity Assessment Lower Extremity Assessment: Generalized weakness (reports neuropathy with numbness bil feet)       Communication   Communication Communication: Impaired Factors Affecting Communication: Hearing impaired (wears hearing aides)    Cognition Arousal: Alert Behavior During Therapy: WFL for tasks assessed/performed   PT - Cognitive impairments: No family/caregiver present to determine baseline                         Following commands: Intact       Cueing Cueing Techniques: Verbal cues     General Comments General comments (skin integrity, edema, etc.): Pt participating, engaged in session, asking about how to prevent fall better    Exercises     Assessment/Plan    PT Assessment Patient needs continued PT services  PT Problem List Decreased strength;Decreased range of motion;Decreased activity tolerance;Decreased balance;Decreased mobility;Decreased coordination;Decreased knowledge of use of DME;Decreased safety awareness;Decreased knowledge of precautions;Cardiopulmonary status limiting activity       PT Treatment Interventions DME instruction;Gait training;Stair training;Functional mobility training;Therapeutic activities;Therapeutic exercise;Balance training;Neuromuscular re-education;Patient/family education    PT Goals (Current  goals can be found in the Care Plan section)  Acute Rehab PT Goals Patient Stated Goal: to return home, reduce pain in legs PT Goal Formulation: With patient Time For Goal Achievement: 05/03/24 Potential to Achieve Goals: Good    Frequency Min 2X/week     Co-evaluation               AM-PAC PT 6 Clicks Mobility  Outcome Measure Help needed turning from your back to your side while in a flat bed without using bedrails?: A Little Help needed moving from lying on your back to sitting on the side of a flat bed without using bedrails?: A Little Help needed moving to and from a bed to a chair (including a wheelchair)?: A Little Help needed standing up from a chair using your arms (e.g., wheelchair or bedside chair)?: A Little Help needed to walk in hospital room?: A Little Help needed climbing 3-5 steps with a railing? : A Lot 6 Click Score: 17    End of Session   Activity Tolerance: Patient tolerated treatment well Patient left: in chair;with call bell/phone within reach;with nursing/sitter in room Nurse Communication: Mobility status PT Visit Diagnosis: Other abnormalities of gait and mobility (R26.89)    Time: 8972-8948 PT Time Calculation (min) (ACUTE ONLY): 24 min   Charges:   PT Evaluation $PT Eval Moderate Complexity: 1 Mod PT Treatments $Gait Training: 8-22 mins PT General Charges $$  ACUTE PT VISIT: 1 Visit         Silvano Currier, PT  Acute Rehabilitation Services Office [phone] Secure Chat welcomed   Silvano VEAR Currier 04/19/2024, 11:53 AM

## 2024-04-19 NOTE — Progress Notes (Signed)
 Orthopedic Tech Progress Note Patient Details:  Joshua Riley 06-13-1936 [id number]  Patient ID: Joshua Riley, male   DOB: [birth year], 88 y.o.   MRN: [id number] Reached out the RN regarding U.B and wanted to see if the patient was ready. RN saw the message but did not respond. [name] 04/19/2024, 7:27 AM

## 2024-04-19 NOTE — Hospital Course (Addendum)
 CC: confusion HPI: Joshua Riley is a 88 y.o. 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   Pt is unable to provide a medical history (daughter unable to be reached by phone), and only relays that he presented to the ED at the request of his daughter. Per nephrology, daughter said patient was more confused than usual.  He was due for dialysis today.  Also patient fell sometime early this morning (0100-0300) and was seen by bystanders but did not want to be transported.   In the ED, the patient was tachycardic and hypotensive. Labs notable for K 5.6, Cr 8.71, lactic acid 1.3 and WBC 9.2. EDP consulted nephrology and requested medicine admission.  Significant Events: Admitted 04/18/2024 for acute metabolic encephalopathy   Admission Labs: WBC 138, K 5.6, CO2 of 20, BUN 89, Scr 8.71, glu 115 T. Prot 6.7, alb 3.3, AST 25, ALT 17, alk phos 112, t. Bili 1.2 NH3 of 13 ETOH <15 Lipase 81 Covid/rsv/flu negative  Admission Imaging Studies: CXR Small right pleural effusion, not significantly changed .2. Mild pulmonary interstitial edema with mild central vascular congestion, improved. 3. No focal pneumonia is evident . Bilateral basal linear atelectasis.  Significant Labs:   Significant Imaging Studies:   Antibiotic Therapy: Anti-infectives (From admission, onward)    Start     Dose/Rate Route  Frequency Ordered Stop   04/18/24 2200  ertapenem  (INVANZ ) 1 g in sodium chloride  0.9 % 100 mL IVPB        1 g 200 mL/hr over 30 Minutes Intravenous Every M-W-F 04/18/24 2006     04/18/24 1330  ertapenem  (INVANZ ) injection 1 g  Status:  Discontinued        1 g Intramuscular Every 24 hours 04/18/24 1318 04/18/24 2006       Procedures:   Consultants: nephrology

## 2024-04-19 NOTE — TOC Progression Note (Addendum)
 Transition of Care [facility]) - Progression Note    Patient Details  Name: Joshua Riley MRN: [id number] Date of Birth: [birth year]  Transition of Care [facility]) CM/SW Contact  Marval Gell, RN Phone Number: 04/19/2024, 11:33 AM  Clinical Narrative:     Beatris w patient at bedside and daughter on speaker phone.  Clarified OBS letter to both daughter and patient.  Patient is adamantly refusing SNF. Daughter expressed concerns about his safety and states that she will increase his aid services from 4 hours a day to 6 hours a day. Patient states that his [facility] PT is scheduled for Thursday; he is active with [facility]. Patient states that he wants to go home today.  Daughter will be here around 3:30 pm, treatment is aware.   Notified [facility] that patient will DC today   Expected Discharge Plan: Home w Home Health Services Barriers to Discharge: Continued Medical Work up               Expected Discharge Plan and Services   Discharge Planning Services: CM Consult Post Acute Care Choice: Home Health Living arrangements for the past 2 months: Apartment                                       Social Drivers of Health (SDOH) Interventions SDOH Screenings   Food Insecurity: No Food Insecurity (04/18/2024)  Housing: Low Risk  (04/18/2024)  Transportation Needs: No Transportation Needs (04/18/2024)  Utilities: Not At Risk (04/18/2024)  Alcohol Screen: Low Risk  (10/26/2023)  Depression (PHQ2-9): Medium Risk (04/07/2024)  Financial Resource Strain: Low Risk  (10/26/2023)  Physical Activity: Insufficiently Active (10/26/2023)  Social Connections: Patient Declined (04/18/2024)  Recent Concern: Social Connections - Socially Isolated (01/27/2024)  Stress: Stress Concern Present (10/26/2023)  Tobacco Use: Medium Risk (04/18/2024)    Readmission Risk Interventions    12/03/2023   11:30 AM 11/10/2023    3:46 PM 07/21/2023   12:26 PM  Readmission Risk Prevention Plan  Transportation  Screening Complete Complete Complete  PCP or Specialist Appt within 5-7 Days  Complete   Home Care Screening  Complete   Medication Review (RN CM)  Complete   HRI or Home Care Consult   Complete  Social Work Consult for Recovery Care Planning/Counseling   Complete  Palliative Care Screening   Not Applicable  Medication Review Oceanographer)   Complete  SW Recovery Care/Counseling Consult Complete    Palliative Care Screening Complete    Skilled Nursing Facility Complete

## 2024-04-19 NOTE — Progress Notes (Addendum)
 [facility] Progress Note   Subjective:    Seen and examined patient at bedside. Seen sitting up in the recliner. He's awake, alert, and oriented currently. Tolerated yesterday's HD with no UF. K+ levels in 5s while here. He refuses to have his diet changed to renal. He states:  I am 88 years old and I'll eat what I want and I will invite you to my funeral. He tells me he's supposed to go home today.  Objective Vitals:   04/19/24 0043 04/19/24 0343 04/19/24 0724 04/19/24 0929  BP: (!) 93/46 (!) 84/65  (!) 85/48  Pulse: 75 78  72  Resp:  17  (!) 22  Temp:  97.6 F (36.4 C)  97.9 F (36.6 C)  TempSrc:  Oral  Oral  SpO2:  96% 97% 100%  Weight:      Height:       Physical Exam General: Elderly male; sitting up in recliner; awake, alert, NAD, RA Heart: S1 and S2; No murmurs, gallops, or rubs Lungs: Clear throughout; No wheezing, rales, or rhonchi Abdomen: Soft and non-tender Extremities: No LE edema Dialysis Access: L AVG   Filed Weights   04/18/24 0549 04/18/24 1245 04/18/24 1553  Weight: 77 kg 67.4 kg 67.4 kg    Intake/Output Summary (Last 24 hours) at 04/19/2024 1312 Last data filed at 04/19/2024 1018 Gross per 24 hour  Intake 100 ml  Output 200 ml  Net -100 ml    Additional Objective Labs: Basic Metabolic Panel: Recent Labs  Lab 04/15/24 0213 04/18/24 0558 04/18/24 0725 04/19/24 0413  NA 134* 138  --  134*  K 5.3* 5.6*  --  5.5*  CL 97* 100  --  95*  CO2 21* 20*  --  22  GLUCOSE 140* 115*  --  133*  BUN 75* 89*  --  48*  CREATININE 7.82* 8.71* 8.76* 5.73*  CALCIUM  8.8* 9.8  --  8.8*   Liver Function Tests: Recent Labs  Lab 04/18/24 0558  AST 25  ALT 17  ALKPHOS 112  BILITOT 1.2  PROT 6.7  ALBUMIN  3.3*   Recent Labs  Lab 04/18/24 0558  LIPASE 81*   CBC: Recent Labs  Lab 04/13/24 0243 04/14/24 1127 04/15/24 0213 04/18/24 0558 04/18/24 0725  WBC 6.9 7.2 8.5 9.3 9.2  NEUTROABS  --   --   --  7.9*  --   HGB 10.2* 10.6*  10.1* 10.5* 10.0*  HCT 32.1* 32.9* 31.4* 32.9* 31.6*  MCV 116.3* 116.7* 116.7* 116.3* 116.6*  PLT 155 127* 133* 123* 120*   Blood Culture    Component Value Date/Time   SDES BLOOD SITE NOT SPECIFIED 04/18/2024 0606   SPECREQUEST  04/18/2024 0606    BOTTLES DRAWN AEROBIC AND ANAEROBIC Blood Culture adequate volume   CULT  04/18/2024 0606    NO GROWTH 1 DAY Performed at [facility], [address] [place], [place] [address]    REPTSTATUS PENDING 04/18/2024 9393    Cardiac Enzymes: No results for input(s): CKTOTAL, CKMB, CKMBINDEX, TROPONINI in the last 168 hours. CBG: No results for input(s): GLUCAP in the last 168 hours. Iron Studies: No results for input(s): IRON, TIBC, TRANSFERRIN, FERRITIN in the last 72 hours. Lab Results  Component Value Date   INR 1.2 03/15/2024   INR 1.2 02/23/2024   INR 1.3 (H) 01/25/2024   Studies/Results: DG CHEST PORT 1 VIEW Result Date: 04/19/2024 EXAM: 1 VIEW(S) XRAY OF THE CHEST 04/19/2024 01:01:33 AM COMPARISON: Portable chest  x-ray 04/11/2024. CLINICAL HISTORY: 201257 ESRD (end stage renal disease) (HCC) [id number]. Shob/ low Oxygen levels. FINDINGS: LINES, TUBES AND DEVICES: A right subclavian venous stent is again shown. LUNGS AND PLEURA: No focal pulmonary opacity. Slight interstitial edema in the lung bases, findings improved. There is a small right pleural effusion. Linear atelectasis in both lung bases. No focal pneumonia is seen. No pneumothorax. HEART AND MEDIASTINUM: There is mild cardiomegaly. The mediastinum is stable with mild aortic tortuosity and atherosclerosis. There is mild prominence in the central vasculature, improved. BONES AND SOFT TISSUES: There is osteopenia with thoracic spondylosis. No acute osseous abnormality. IMPRESSION: 1. Small right pleural effusion, not significantly changed . 2. Mild pulmonary interstitial edema with mild central vascular congestion, improved. 3. No focal pneumonia is evident .  Bilateral basal linear atelectasis. Electronically signed by: Francis Quam MD 04/19/2024 01:13 AM EDT RP Workstation: [id number]   CT Head Wo Contrast Result Date: 04/18/2024 EXAM: CT HEAD WITHOUT CONTRAST 04/18/2024 06:34:38 AM TECHNIQUE: CT of the head was performed without the administration of intravenous contrast. Automated exposure control, iterative reconstruction, and/or weight based adjustment of the mA/kV was utilized to reduce the radiation dose to as low as reasonably achievable. COMPARISON: Brain MRI 11/29/2023, head CT 11/28/2023. CLINICAL HISTORY: 88 year old male with mental status change, unknown cause (AMS). FINDINGS: BRAIN AND VENTRICLES: No acute hemorrhage. No evidence of acute infarct. No hydrocephalus. No extra-axial collection. No mass effect or midline shift. Cerebral volume is stable and within normal limits for age. Very mild for age chronic periventricular white matter hypodensity is stable. Calcified atherosclerosis at the skull base. No suspicious intracranial vascular hyperdensity. ORBITS: Postoperative changes to both globes. SINUSES: Mild paranasal sinus mucosal thickening. SOFT TISSUES AND SKULL: Chronic calcified scalp vessel atherosclerosis. No acute soft tissue abnormality. No skull fracture. IMPRESSION: 1. No acute intracranial abnormality. 2. Negative for age non-contrast CT appearance of the brain. Electronically signed by: Helayne Hurst MD 04/18/2024 06:47 AM EDT RP Workstation: [id number]    Medications:  [START ON 04/20/2024] ertapenem      [START ON 04/20/2024] vancomycin       apixaban   2.5 mg Oral BID   atorvastatin   20 mg Oral Daily   [START ON 04/20/2024] calcitRIOL   0.5 mcg Oral Q M,W,F-HD   Chlorhexidine  Gluconate Cloth  6 each Topical Q0600   dorzolamide   1 drop Right Eye QHS   fludrocortisone   0.1 mg Oral Daily   fluticasone  furoate-vilanterol  1 puff Inhalation Daily   hydrocortisone   10 mg Oral Q24H   hydrocortisone   20 mg Oral Q24H   midodrine    30 mg Oral TID   pantoprazole   40 mg Oral BID   ramelteon   8 mg Oral QHS   sodium zirconium cyclosilicate   10 g Oral TID   timolol   1 drop Both Eyes BID    Dialysis Orders: MWF [facility] 2h   B400  72kg   2K bath   AVG    Heparin  none  Home bp meds: Midodrine  30mg  tid  Assessment/Plan: AMS: Returned to baseline today. CT head negative. Per pmd.  ESRD: on HD MWF. Next HD 10/15 in outpatient Hyperkalemia: K+ in 5s lately. He refuses to have his diet changed to renal. Will check weekly K+ levels for next few weeks in outpatient. Consider Lokelma  on non-HD days if no improvement. BP: chronic hypotension on high dose midodrine  30 tid and hydrocortisone  daily for adrenal failure.  Volume: no edema on exam, on RA. Follow.  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

## 2024-04-19 NOTE — Progress Notes (Addendum)
 D/c orders noted. Contacted out-pt HD clinic, [facility], to inform of pt d/c and to anticipate arrival tomorrow. No further support needed.   Charish Schroepfer [facility] Nav [phone]  Noted that d/c has been cancelled. Contacted out-pt HD to inform he will likely go to rehab and will not arrive tomorrow as stated earlier. Will continue to assist

## 2024-04-19 NOTE — Assessment & Plan Note (Addendum)
 04/19/24 chronic. Not uncommon for him to have SBP in the high 80s to 90s.  On midodrine  30 mg tid.  Prior dc summary in 03-2024 said that he was suppose to also be on Florinef  01 mg daily. Did not see that on his DC summary from 04-15-2024. Will go ahead and restart Florinef  0.1 mg daily.  **update, per discussed with nephrology Dr. Geralynn, pt's hypotension with SBP in the 80-90s is chronic for him and stable. Pt cleared for discharge today by nephrology.

## 2024-04-19 NOTE — Assessment & Plan Note (Addendum)
 04/19/24 continue with pre-sacral drain. On IV Ivanz and Vanco through 04-29-2024.  Saw ID in clinic on 03-31-2024.

## 2024-04-19 NOTE — Assessment & Plan Note (Addendum)
 04/19/24 pt with ileostomy and prior total colectomy. Presumably from his hx of ulcerative colitis. His surgery was not performed at [facility]. Care Everywhere records reviewed. States pt had total colectomy performed in June 2011 in [place] .

## 2024-04-19 NOTE — Progress Notes (Addendum)
 PROGRESS NOTE    Joshua Riley  FMW:[id number] DOB: 09-30-1935 DOA: 04/18/2024 PCP: Katrinka Garnette KIDD, MD  Subjective: Pt seen and examined. Very complex history. Multiple admissions since jan 2025. I count 9 admission including this one.  Pt adamant he wants to go home. Is AxOx4.   Hospital Course: CC: confusion HPI: Joshua Riley is a 88 y.o. 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   Pt is unable to provide a medical history (daughter unable to be reached by phone), and only relays that he presented to the ED at the request of his daughter. Per nephrology, daughter said patient was more confused than usual.  He was due for dialysis today.  Also patient fell sometime early this morning (0100-0300) and was seen by bystanders but did not want to be transported.   In the ED, the patient was tachycardic and hypotensive. Labs notable for K 5.6, Cr 8.71, lactic acid 1.3 and WBC 9.2. EDP consulted nephrology and requested medicine admission.  Significant Events: Admitted 04/18/2024 for acute metabolic encephalopathy   Admission Labs: WBC 138, K 5.6, CO2 of 20, BUN 89, Scr 8.71, glu 115 T. Prot 6.7, alb 3.3, AST 25, ALT 17, alk phos 112, t. Bili 1.2 NH3 of 13 ETOH <15 Lipase 81 Covid/rsv/flu negative  Admission Imaging Studies: CXR Small right pleural effusion, not significantly changed  .2. Mild pulmonary interstitial edema with mild central vascular congestion, improved. 3. No focal pneumonia is evident . Bilateral basal linear atelectasis.  Significant Labs:   Significant Imaging Studies:   Antibiotic Therapy: Anti-infectives (From admission, onward)    Start     Dose/Rate Route Frequency Ordered Stop   04/18/24 2200  ertapenem  (INVANZ ) 1 g in sodium chloride  0.9 % 100 mL IVPB        1 g 200 mL/hr over 30 Minutes Intravenous Every M-W-F 04/18/24 2006     04/18/24 1330  ertapenem  (INVANZ ) injection 1 g  Status:  Discontinued        1 g Intramuscular Every 24 hours 04/18/24 1318 04/18/24 2006       Procedures:   Consultants: nephrology    Assessment and Plan: * Acute metabolic encephalopathy 04/19/24 pt was altered yesterday on admission. Today, pt is alert and oriented x 4. Pt refuses to consider SNF. Wants to go home today. Unclear the cause. Pt has remained on IV abx since he was discharged on 04-15-2024. Not particularly uremic on admission. BUN was 89. After HD yesterday, BUN down to 48. Pt is alert and oriented x 4.   *update. Discussed case with Dr. Geralynn with nephrology. Per his exam today, pt is back to being AxOx4. Back to his neuro baseline. Nephrology has cleared him for discharge today. TOC/CM discussed with pt and he is still refusing SNF placement. Dtr to come to hospital to pick him up later.  Orthostatic hypotension 04/19/24 chronic. Not uncommon for him to have SBP in the high 80s to 90s.  On midodrine  30 mg tid.  Prior dc summary in 03-2024 said that he was  suppose to also be on Florinef  01 mg daily. Did not see that on his DC summary from 04-15-2024. Will go ahead and restart Florinef  0.1 mg daily.  **update, per discussed with nephrology Dr. Geralynn, pt's hypotension with SBP in the 80-90s is chronic for him and stable. Pt cleared for discharge today by nephrology.  Bilateral leg edema 04/19/24 chronic. I placed ACE bandage compression  wraps to both his legs.  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 On IV Ivanz and Vanco through 04-29-2024.  Saw ID in clinic on 03-31-2024.  Adrenal insufficiency 04/19/24 chronic. On hydrocortisone  bid. 20 mg qam and 10 mg qpm.  Paroxysmal atrial fibrillation (HCC) 04/19/24 stable. On Eliquis . No rate controlling meds due to his chronic hypotension. EKG from admission shows rate controlled afib. In reviewing his prior hospital notes, pt had been placed on IV amiodarone  during 11-2023 admission. Amiodarone  had to be stopped due to subsequent bradycardia.  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 Coli that was cultured from pre-sacral abscess culture from 03-16-2024.  Per last office ID note from 03-31-2024 his last dose of IV Ivanz/Vanco should be Oct 24th, 2025. Continue IV Vanz and IV Vanco. Pharmacy aware.  Ulcerative colitis (HCC) - S/p total colectomy. From his Care Everywhere records, this appears to have been performed in [place] in June 2011 04/19/24 chronic. S/p total colectomy. From his Care Everywhere records, this appears to have been performed in [place] in June 2011  End-stage renal disease on hemodialysis (HCC) 04/19/24 pt seen by nephrology. Had HD yesterday.  Nephrostomy present (HCC) - left kidney. Care Everywhere records reviewed. appears to bave been ordered by Dr. Dasie Clark in [place] . placed [phone] 04/19/24  left sided. appears to have this changed every 2-3 months.  Colostomy status (HCC) -  Care Everywhere records reviewed. States pt had total colectomy performed in June 2011 in [place] . 04/19/24 pt with ileostomy and prior total colectomy. Presumably from his hx of ulcerative colitis. His surgery was not performed at [facility] Care Everywhere records reviewed. States pt had total colectomy performed in June 2011 in [place] .   Pressure injury of skin of multiple topographic sites 04/19/24 present on  admission. Reason for Consult: LE wounds  Patient was seen by [facility] nursing team last week 10/8; has been DC and returned 10/13 for AMS   Wound type: venous ulcerations in the presence of PAD; apparently Unna's boots have been applied at some point; I did not feel comfortable placing orders last week without an ABI and known history of PAD.   Linear wounds on the left 2nd and 3rd toe; clean and dry; ok to apply xeroform every other day Black wound noted right medial 2nd toe; with history of PAD; continue to paint with betadine daily. Allow to air dry Large akin tear right arm, cleanse with saline, pat dry. Apply single layer of xeroform and change every other day and dry topper dressing.  Skin tear left elbow, skin flap present; attempt to re-aproximate skin flap and steristrip, apply single layer of xeroform gauze and dry dressing  Skin tear left bicep, cleanse with saline, pat dry. Cover with single layer of xeroform and top with dry dressing or foam. Change every other day.  Skin tear left outer bicep; cleanse with saline, pat dry. Apply single layer of xeroform and top with dry dressing. Change every other day.  Dry lesion left shoulder blade See ostomy care below.  Last  week patient had two lesions on the bilateral LEs, will have staff remove Unna's boots and verify with MD to replace.                               DVT prophylaxis: apixaban  (ELIQUIS ) tablet 2.5 mg Start: 04/18/24 2200 apixaban  (ELIQUIS ) tablet 2.5 mg     Code Status: Limited: Do not attempt resuscitation (DNR) -DNR-LIMITED -Do Not Intubate/DNI  Family Communication: no family at bedside Disposition Plan: unknown Reason for continuing need for hospitalization: remains on IV ABX. Assessing pt's ability to go home vs SNF.  Objective: Vitals:   04/19/24 0043 04/19/24 0343 04/19/24 0724 04/19/24 0929  BP: (!) 93/46 (!) 84/65  (!) 85/48  Pulse: 75 78  72  Resp:  17  (!) 22  Temp:  97.6 F (36.4  C)  97.9 F (36.6 C)  TempSrc:  Oral  Oral  SpO2:  96% 97% 100%  Weight:      Height:        Intake/Output Summary (Last 24 hours) at 04/19/2024 1217 Last data filed at 04/19/2024 1018 Gross per 24 hour  Intake 100 ml  Output 200 ml  Net -100 ml   Filed Weights   04/18/24 0549 04/18/24 1245 04/18/24 1553  Weight: 77 kg 67.4 kg 67.4 kg    Examination:  Physical Exam Vitals and nursing note reviewed.  Constitutional:      General: He is not in acute distress.    Appearance: He is not toxic-appearing.     Comments: Appears chronically ill  HENT:     Head: Normocephalic and atraumatic.  Neurological:     Mental Status: He is alert.     Data Reviewed: I have personally reviewed following labs and imaging studies  CBC: Recent Labs  Lab 04/13/24 0243 04/14/24 1127 04/15/24 0213 04/18/24 0558 04/18/24 0725  WBC 6.9 7.2 8.5 9.3 9.2  NEUTROABS  --   --   --  7.9*  --   HGB 10.2* 10.6* 10.1* 10.5* 10.0*  HCT 32.1* 32.9* 31.4* 32.9* 31.6*  MCV 116.3* 116.7* 116.7* 116.3* 116.6*  PLT 155 127* 133* 123* 120*   Basic Metabolic Panel: Recent Labs  Lab 04/13/24 0243 04/14/24 1127 04/15/24 0213 04/18/24 0558 04/18/24 0725 04/19/24 0413  NA 140 136 134* 138  --  134*  K 4.8 4.6 5.3* 5.6*  --  5.5*  CL 103 101 97* 100  --  95*  CO2 22 17* 21* 20*  --  22  GLUCOSE 110* 83 140* 115*  --  133*  BUN 60* 59* 75* 89*  --  48*  CREATININE 7.28* 6.45* 7.82* 8.71* 8.76* 5.73*  CALCIUM  9.2 8.7* 8.8* 9.8  --  8.8*   GFR: Estimated Creatinine Clearance: 9 mL/min (A) (by C-G formula based on SCr of 5.73 mg/dL (H)). Liver Function Tests: Recent Labs  Lab 04/18/24 0558  AST 25  ALT 17  ALKPHOS 112  BILITOT 1.2  PROT 6.7  ALBUMIN  3.3*   Recent Labs  Lab 04/18/24 0558  LIPASE 81*   Recent Labs  Lab 04/18/24 0615  AMMONIA 13   Sepsis Labs: Recent Labs  Lab 04/18/24 0606  LATICACIDVEN 1.3    Recent Results (from the past 240 hours)  Blood Culture  (routine x 2)     Status: None   Collection Time: 04/11/24 10:12 PM   Specimen: BLOOD  Result Value Ref Range Status  Specimen Description   Final    BLOOD LEFT ANTECUBITAL Performed at [facility], [address], [place], [place] [address]    Special Requests   Final    BOTTLES DRAWN AEROBIC AND ANAEROBIC Blood Culture adequate volume Performed at [facility], [address] [place], [place] [address]    Culture   Final    NO GROWTH 5 DAYS Performed at [facility], [address] [place], [place] [address]    Report Status 04/17/2024 FINAL  Final  Blood Culture (routine x 2)     Status: None   Collection Time: 04/12/24  8:30 PM   Specimen: BLOOD RIGHT ARM  Result Value Ref Range Status   Specimen Description BLOOD RIGHT ARM  Final   Special Requests   Final    BOTTLES DRAWN AEROBIC AND ANAEROBIC Blood Culture adequate volume   Culture   Final    NO GROWTH 5 DAYS Performed at [facility], [address], [place], [place] [address]    Report Status 04/17/2024 FINAL  Final  Culture, blood (routine x 2)     Status: None (Preliminary result)   Collection Time: 04/18/24  6:01 AM   Specimen: BLOOD  Result Value Ref Range Status   Specimen Description BLOOD SITE NOT SPECIFIED  Final   Special Requests   Final    BOTTLES DRAWN AEROBIC AND ANAEROBIC Blood Culture results may not be optimal due to an inadequate volume of blood received in culture bottles   Culture   Final    NO GROWTH 1 DAY Performed at [facility], [address], [place], [place] [address]    Report Status PENDING  Incomplete  Resp panel by RT-PCR (RSV, Flu A&B, Covid) Anterior Nasal Swab     Status: None   Collection Time: 04/18/24  6:01 AM   Specimen: Anterior Nasal Swab  Result Value Ref Range Status   SARS Coronavirus 2 by RT PCR NEGATIVE NEGATIVE Final   Influenza A by PCR NEGATIVE NEGATIVE Final   Influenza B by PCR NEGATIVE NEGATIVE Final     Comment: (NOTE) The Xpert Xpress SARS-CoV-2/FLU/RSV plus assay is intended as an aid in the diagnosis of influenza from Nasopharyngeal swab specimens and should not be used as a sole basis for treatment. Nasal washings and aspirates are unacceptable for Xpert Xpress SARS-CoV-2/FLU/RSV testing.  Fact Sheet for Patients: BloggerCourse.com  Fact Sheet for Healthcare Providers: SeriousBroker.it  This test is not yet approved or cleared by the [place]  FDA and has been authorized for detection and/or diagnosis of SARS-CoV-2 by FDA under an Emergency Use Authorization (EUA). This EUA will remain in effect (meaning this test can be used) for the duration of the COVID-19 declaration under Section 564(b)(1) of the Act, 21 U.S.C. section 360bbb-3(b)(1), unless the authorization is terminated or revoked.     Resp Syncytial Virus by PCR NEGATIVE NEGATIVE Final    Comment: (NOTE) Fact Sheet for Patients: BloggerCourse.com  Fact Sheet for Healthcare Providers: SeriousBroker.it  This test is not yet approved or cleared by the [place]  FDA and has been authorized for detection and/or diagnosis of SARS-CoV-2 by FDA under an Emergency Use Authorization (EUA). This EUA will remain in effect (meaning this test can be used) for the duration of the COVID-19 declaration under Section 564(b)(1) of the Act, 21 U.S.C. section 360bbb-3(b)(1), unless the authorization is terminated or revoked.  Performed at [facility], [address], [place],   [address]   Culture, blood (routine x 2)     Status: None (Preliminary result)   Collection Time: 04/18/24  6:06 AM   Specimen: BLOOD  Result Value Ref Range Status   Specimen Description BLOOD SITE NOT SPECIFIED  Final   Special Requests   Final    BOTTLES DRAWN AEROBIC AND ANAEROBIC Blood Culture adequate volume   Culture   Final     NO GROWTH 1 DAY Performed at [facility], [address], [place], [place] [address]    Report Status PENDING  Incomplete     Radiology Studies: DG CHEST PORT 1 VIEW Result Date: 04/19/2024 EXAM: 1 VIEW(S) XRAY OF THE CHEST 04/19/2024 01:01:33 AM COMPARISON: Portable chest x-ray 04/11/2024. CLINICAL HISTORY: 201257 ESRD (end stage renal disease) (HCC) [id number]. Shob/ low Oxygen levels. FINDINGS: LINES, TUBES AND DEVICES: A right subclavian venous stent is again shown. LUNGS AND PLEURA: No focal pulmonary opacity. Slight interstitial edema in the lung bases, findings improved. There is a small right pleural effusion. Linear atelectasis in both lung bases. No focal pneumonia is seen. No pneumothorax. HEART AND MEDIASTINUM: There is mild cardiomegaly. The mediastinum is stable with mild aortic tortuosity and atherosclerosis. There is mild prominence in the central vasculature, improved. BONES AND SOFT TISSUES: There is osteopenia with thoracic spondylosis. No acute osseous abnormality. IMPRESSION: 1. Small right pleural effusion, not significantly changed . 2. Mild pulmonary interstitial edema with mild central vascular congestion, improved. 3. No focal pneumonia is evident . Bilateral basal linear atelectasis. Electronically signed by: Francis Quam MD 04/19/2024 01:13 AM EDT RP Workstation: [id number]   CT Head Wo Contrast Result Date: 04/18/2024 EXAM: CT HEAD WITHOUT CONTRAST 04/18/2024 06:34:38 AM TECHNIQUE: CT of the head was performed without the administration of intravenous contrast. Automated exposure control, iterative reconstruction, and/or weight based adjustment of the mA/kV was utilized to reduce the radiation dose to as low as reasonably achievable. COMPARISON: Brain MRI 11/29/2023, head CT 11/28/2023. CLINICAL HISTORY: 88 year old male with mental status change, unknown cause (AMS). FINDINGS: BRAIN AND VENTRICLES: No acute hemorrhage. No evidence of acute infarct. No hydrocephalus.  No extra-axial collection. No mass effect or midline shift. Cerebral volume is stable and within normal limits for age. Very mild for age chronic periventricular white matter hypodensity is stable. Calcified atherosclerosis at the skull base. No suspicious intracranial vascular hyperdensity. ORBITS: Postoperative changes to both globes. SINUSES: Mild paranasal sinus mucosal thickening. SOFT TISSUES AND SKULL: Chronic calcified scalp vessel atherosclerosis. No acute soft tissue abnormality. No skull fracture. IMPRESSION: 1. No acute intracranial abnormality. 2. Negative for age non-contrast CT appearance of the brain. Electronically signed by: Helayne Hurst MD 04/18/2024 06:47 AM EDT RP Workstation: [id number]    Scheduled Meds:  apixaban   2.5 mg Oral BID   atorvastatin   20 mg Oral Daily   [START ON 04/20/2024] calcitRIOL   0.5 mcg Oral Q M,W,F-HD   Chlorhexidine  Gluconate Cloth  6 each Topical Q0600   dorzolamide   1 drop Right Eye QHS   fludrocortisone   0.1 mg Oral Daily   fluticasone  furoate-vilanterol  1 puff Inhalation Daily   hydrocortisone   10 mg Oral Q24H   hydrocortisone   20 mg Oral Q24H   midodrine   30 mg Oral TID   pantoprazole   40 mg Oral BID   ramelteon   8 mg Oral QHS   sodium zirconium cyclosilicate   10 g Oral TID   timolol   1 drop Both Eyes BID   Continuous Infusions:  [START ON 04/20/2024] ertapenem      [  START ON 04/20/2024] vancomycin        LOS: 0 days   Time spent: 90 minutes  Camellia Door, DO  [facility]  04/19/2024, 12:17 PM

## 2024-04-19 NOTE — Progress Notes (Signed)
   Went up to pt's room to talk to pt and his dtr rene. Pt and dtr got into verbal argument over his healthcare choices. Lots of accusations and blaming. Ultimately, pt has listened to his dtr and agrees to go to rehab. CM/TOC informed. SNF search process to start. Discharge canceled for today.  Camellia Door, DO [facility]

## 2024-04-19 NOTE — Discharge Instructions (Addendum)
 Patient make appointment with Darice KASS NP [facility] outpatient clinic regarding skin care needs and CG education, [phone].  Ostomy nurse signed the patient up for [place] samples sent to his home   Additional Discharge Instructions   Please get your medications reviewed and adjusted by your Primary MD.  Please request your Primary MD to go over all Hospital Tests and Procedure/Radiological results at the follow up, please get all Hospital records sent to your Primary MD by signing hospital release before you go home.  If you had Pneumonia of Lung problems at the Hospital: Please get a 2 view Chest X ray done in approximately 4 weeks after hospital discharge or sooner if instructed by your Primary MD.  If you have Congestive Heart Failure: Please call your Cardiologist or Primary MD anytime you have any of the following symptoms:  1) 3 pound weight gain in 24 hours or 5 pounds in 1 week  2) shortness of breath, with or without a dry hacking cough  3) swelling in the hands, feet or stomach  4) if you have to sleep on extra pillows at night in order to breathe  Follow cardiac low salt diet and 1.5 lit/day fluid restriction.  If you have diabetes Accuchecks 4 times/day, Once in AM empty stomach and then before each meal. Log in all results and show them to your primary doctor at your next visit. If any glucose reading is under 80 or above 300 call your primary MD immediately.  If you have Seizure/Convulsions/Epilepsy: Please do not drive, operate heavy machinery, participate in activities at heights or participate in high speed sports until you have seen by Primary MD or a Neurologist and advised to do so again. Per [place]  DMV statutes, patients with seizures are not allowed to drive until they have been seizure-free for six months.  Use caution when using heavy equipment or power tools. Avoid working on ladders or at heights. Take showers instead of baths. Ensure the water  temperature is not too high on the home water heater. Do not go swimming alone. Do not lock yourself in a room alone (i.e. bathroom). When caring for infants or small children, sit down when holding, feeding, or changing them to minimize risk of injury to the child in the event you have a seizure. Maintain good sleep hygiene. Avoid alcohol.   If you had Gastrointestinal Bleeding: Please ask your Primary MD to check a complete blood count within one week of discharge or at your next visit. Your endoscopic/colonoscopic biopsies that are pending at the time of discharge, will also need to followed by your Primary MD.  Get Medicines reviewed and adjusted. Please take all your medications with you for your next visit with your Primary MD  Please request your Primary MD to go over all hospital tests and procedure/radiological results at the follow up, please ask your Primary MD to get all Hospital records sent to his/her office.  If you experience worsening of your admission symptoms, develop shortness of breath, life threatening emergency, suicidal or homicidal thoughts you must seek medical attention immediately by calling 911 or calling your MD immediately  if symptoms less severe.  You must read complete instructions/literature along with all the possible adverse reactions/side effects for all the Medicines you take and that have been prescribed to you. Take any new Medicines after you have completely understood and accpet all the possible adverse reactions/side effects.   Do not drive or operate heavy machinery when taking Pain medications.  Do not take more than prescribed Pain, Sleep and Anxiety Medications  Special Instructions: If you have smoked or chewed Tobacco  in the last 2 yrs please stop smoking, stop any regular Alcohol  and or any Recreational drug use.  Wear Seat belts while driving.  Please note You were cared for by a hospitalist during your hospital stay. If you have any  questions about your discharge medications or the care you received while you were in the hospital after you are discharged, you can call the unit and asked to speak with the hospitalist on call if the hospitalist that took care of you is not available. Once you are discharged, your primary care physician will handle any further medical issues. Please note that NO REFILLS for any discharge medications will be authorized once you are discharged, as it is imperative that you return to your primary care physician (or establish a relationship with a primary care physician if you do not have one) for your aftercare needs so that they can reassess your need for medications and monitor your lab values.  You can reach the hospitalist office at phone [phone] or fax [phone]   If you do not have a primary care physician, you can call [phone] for a physician referral.

## 2024-04-19 NOTE — Consult Note (Signed)
 WOC Nurse ostomy consult note Stoma type/location: Right lower quadrant; Ileostomy Stomal assessment/size: 23 mm round, moist, budded, viable Peristomal assessment: peristomal skin breakdown related to caustic effluent exposure, epidermal thinning; shiny skin, surgical creases noted below stoma, mid-abd, some sites of denuded skin, peristomal dermatitis  Treatment options for stomal/peristomal skin: crusting Output 150 mls semi-liquid brown effluent Ostomy pouching: 1pc with barrier rings to all creases, cut hydrocolloid wafer cut strips and placed around stoma Education provided: skin care needs, warming up wafer barrier before application, use of barrier rings along creases, crusting method using powder/no-sting wipes Enrolled patient in [employer] DC program: Yes Patient asked good questions but was not clear regarding how helps me apply appliance and could not recall frequency of change out or verbalized understanding of crusting technique. Nurse to place belt as soon as available.  Patient may benefit from a 1-piece soft convex appliance [phone] with barrier ring [phone], large ostomy belt #621   04/19/24 Ileostomy Site   Informed this patient is being discharged today once the daughter is available. Coordinated care with nurse navigator. Recommend that patient make appointment with Darice KASS NP [facility] outpatient clinic regarding skin care needs and CG education, [phone].  Please reconsult if wound worsens in condition and notify provider.   Sherrilyn Hals MSN RN [facility]OCN [facility]  [phone] (Available from 7-3 pm Mon-Friday)

## 2024-04-19 NOTE — Assessment & Plan Note (Addendum)
 04/19/24 chronic. I placed ACE bandage compression wraps to both his legs.

## 2024-04-19 NOTE — Plan of Care (Signed)

## 2024-04-19 NOTE — Assessment & Plan Note (Addendum)
 04/19/24 stable. On Eliquis . No rate controlling meds due to his chronic hypotension. EKG from admission shows rate controlled afib. In reviewing his prior hospital notes, pt had been placed on IV amiodarone  during 11-2023 admission. Amiodarone  had to be stopped due to subsequent bradycardia.

## 2024-04-19 NOTE — Progress Notes (Signed)
 Pharmacy Antibiotic Note  Joshua Riley is a 88 y.o. male admitted on 04/18/2024 with osteomyelitis. Patient was being managed with ertapenem  / vancomycin  per Infectious Diseases until 10/24 for sacral osteo. Pharmacy has been consulted for vancomycin  dosing.  Last HD session 10/13, did not receive vancomycin  after this session.   Plan: Give 1x vancomycin  750 mg dose today to make up for missed dose yesterday  Vancomycin  750 mg MWF after dialysis  Ertapenem  1 g MWF after dialysis    Height: 5' 8 (172.7 cm) Weight: 67.4 kg (148 lb 9.4 oz) IBW/kg (Calculated) : 68.4  Temp (24hrs), Avg:97.6 F (36.4 C), Min:96.8 F (36 C), Max:98 F (36.7 C)  Recent Labs  Lab 04/13/24 0243 04/14/24 1127 04/15/24 0213 04/18/24 0558 04/18/24 0606 04/18/24 0725 04/19/24 0413  WBC 6.9 7.2 8.5 9.3  --  9.2  --   CREATININE 7.28* 6.45* 7.82* 8.71*  --  8.76* 5.73*  LATICACIDVEN  --   --   --   --  1.3  --   --     Estimated Creatinine Clearance: 9 mL/min (A) (by C-G formula based on SCr of 5.73 mg/dL (H)).    Allergies  Allergen Reactions   Baclofen Other (See Comments)    Altered mental status, after accidental overdose     Cephalosporins Rash   Ciprofloxacin Itching and Rash   Wound Dressing Adhesive Rash    Rankin Sams, PharmD, BCPS, BCCCP Clinical Pharmacist

## 2024-04-19 NOTE — NC FL2 (Signed)
 [place]  MEDICAID FL2 LEVEL OF CARE FORM     IDENTIFICATION  Patient Name: Joshua Riley Birthdate: [birth year] Sex: male Admission Date (Current Location): 04/18/2024  [facility] and [place] Number:  Producer, television/film/video and Address:  [facility], [address] [place], [place] [address]      Provider Number: [id number]  Attending Physician Name and Address:  Laurence Locus, DO  Relative Name and Phone Number:  [name] Brooke; Daughter; [phone]    Current Level of Care: Hospital Recommended Level of Care: Skilled Nursing Facility Prior Approval Number:    Date Approved/Denied:   PASRR Number:    Discharge Plan: SNF    Current Diagnoses: Patient Active Problem List   Diagnosis Date Noted   Pressure injury of skin of multiple topographic sites 04/19/2024   Bilateral leg edema 04/12/2024   Obstructive uropathy    Presacral Abscess 02/22/2024   Adrenal insufficiency 02/11/2024   Asthma 02/11/2024   Cerebrovascular disease 12/02/2023   Abscess of abdominal cavity (HCC) 07/17/2023   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 From his Care Everywhere records, this appears to have been performed in [place] in June 2011 06/02/2022   Acute metabolic encephalopathy 06/02/2022   End-stage renal disease on hemodialysis (HCC) 05/06/2022   Glaucoma 11/18/2021   Paroxysmal atrial fibrillation (HCC) 11/18/2021   Orthostatic hypotension 10/22/2021   Idiopathic neuropathy 10/22/2021   Colostomy status (HCC) -  Care Everywhere records reviewed. States pt had total colectomy performed in June 2011 in [place]. 10/22/2021   Nephrostomy present (HCC) - left kidney. Care Everywhere records reviewed. appears to bave been ordered by Dr. Dasie Clark in [place] . placed 02-2012 10/22/2021    Orientation RESPIRATION BLADDER Height & Weight     Self, Time, Situation, Place  Normal (Room Air) Continent Weight: 148 lb 9.4 oz (67.4 kg) Height:  5' 8 (172.7 cm)  BEHAVIORAL SYMPTOMS/MOOD NEUROLOGICAL BOWEL NUTRITION STATUS      Continent, Colostomy (Colostomy RLQ) Diet (Please see discharge summary)  AMBULATORY STATUS COMMUNICATION OF NEEDS Skin   Limited Assist Verbally Other (Comment), PU Stage and Appropriate Care (Wound Arm Distal;Posterior;Right;Upper; Wound Pretibial Left; Wound Pretibial Right; Wound Arm Anterior;Left;Upper; Wound Arm Anterior;Distal;Right;Upper; Pressure Injury Sacrum Medial Stage 1; Closed System Drain 2 L Buttock; Nephrostomy L 10.2 Fr.)                       Personal Care Assistance Level of Assistance  Bathing, Feeding, Dressing Bathing Assistance: Limited assistance Feeding assistance: Limited assistance Dressing Assistance: Limited assistance     Functional Limitations Info  Sight, Hearing Sight Info: Impaired (R and L) Hearing Info: Impaired (R and L)      SPECIAL CARE FACTORS FREQUENCY  PT (By licensed PT), OT (By licensed OT)     PT Frequency: 5x OT Frequency: 5x            Contractures Contractures Info: Not present    Additional Factors Info  Code Status, Allergies, Isolation Precautions Code Status Info: DNR-LIMITED -Do Not Intubate/DNI Allergies Info: Baclofen; Cephalosporins; Ciprofloxacin; Wound Dressing Adhesive  Isolation Precautions Info: Contact Precautions ESBL     Current Medications (04/19/2024):  This is the current hospital active medication list Current Facility-Administered Medications  Medication Dose Route Frequency Provider Last Rate Last Admin   apixaban  (ELIQUIS ) tablet 2.5 mg  2.5 mg Oral BID Georgina Basket, MD    2.5 mg at 04/19/24 0931   atorvastatin  (LIPITOR) tablet 20 mg  20 mg Oral Daily Georgina Basket, MD   20 mg at 04/19/24 0932   [START ON 04/20/2024] calcitRIOL  (ROCALTROL ) capsule 0.5 mcg  0.5 mcg Oral Q M,W,F-HD Georgina Basket, MD       Chlorhexidine  Gluconate Cloth 2 % PADS 6 each  6 each Topical Q0600 Geralynn Charleston, MD   6 each at 04/19/24 0606   dorzolamide  (TRUSOPT ) 2 % ophthalmic solution 1 drop  1 drop Right Eye QHS Moore, Willie, MD   1 drop at 04/18/24 2233   [START ON 04/20/2024] ertapenem  (INVANZ ) 1 g in sodium chloride  0.9 % 100 mL IVPB  1 g Intravenous Q M,W,F-HD Laurence Locus, DO       fludrocortisone  (FLORINEF ) tablet 0.1 mg  0.1 mg Oral Daily Laurence Locus, DO   0.1 mg at 04/19/24 1441   fluticasone  furoate-vilanterol (BREO ELLIPTA ) 100-25 MCG/ACT 1 puff  1 puff Inhalation Daily Georgina Basket, MD   1 puff at 04/19/24 0724   hydrocortisone  (CORTEF ) tablet 10 mg  10 mg Oral Q24H Segars, Dorn, MD       hydrocortisone  (CORTEF ) tablet 20 mg  20 mg Oral Q24H Segars, Jonathan, MD   20 mg at 04/19/24 0606   liver oil-zinc oxide (DESITIN) 40 % ointment   Topical BID Laurence Locus, DO   Given at 04/19/24 1443   midodrine  (PROAMATINE ) tablet 30 mg  30 mg Oral TID Long, Joshua G, MD   30 mg at 04/19/24 9068   pantoprazole  (PROTONIX ) EC tablet 40 mg  40 mg Oral BID Moore, Willie, MD   40 mg at 04/19/24 0932   ramelteon  (ROZEREM ) tablet 8 mg  8 mg Oral QHS Georgina Basket, MD   8 mg at 04/18/24 2222   sodium zirconium cyclosilicate  (LOKELMA ) packet 10 g  10 g Oral TID Geralynn Charleston, MD   10 g at 04/19/24 0940   timolol  (TIMOPTIC ) 0.5 % ophthalmic solution 1 drop  1 drop Both Eyes BID Georgina Basket, MD   1 drop at 04/19/24 9066   traMADol  (ULTRAM ) tablet 50 mg  50 mg Oral Q12H PRN Laurence Locus, DO       [START ON 04/20/2024] vancomycin  (VANCOREADY) IVPB 750 mg/150 mL  750 mg Intravenous Q M,W,F-HD Laurence Locus, DO         Discharge Medications: Please see discharge summary for a list of discharge  medications.  Relevant Imaging Results:  Relevant Lab Results:   Additional Information SSN-[id number]; [facility] WAIVER THREE NIGHT QUALIFYING STAY  Lauraine BRAVO Vianka Ertel, LCSWA

## 2024-04-20 DIAGNOSIS — G9341 Metabolic encephalopathy: Secondary | ICD-10-CM | POA: Diagnosis not present

## 2024-04-20 DIAGNOSIS — G9340 Encephalopathy, unspecified: Secondary | ICD-10-CM | POA: Diagnosis not present

## 2024-04-20 LAB — CBC WITH DIFFERENTIAL/PLATELET
Abs Immature Granulocytes: 0.09 K/uL — ABNORMAL HIGH (ref 0.00–0.07)
Basophils Absolute: 0 K/uL (ref 0.0–0.1)
Basophils Relative: 0 %
Eosinophils Absolute: 0.1 K/uL (ref 0.0–0.5)
Eosinophils Relative: 1 %
HCT: 31.4 % — ABNORMAL LOW (ref 39.0–52.0)
Hemoglobin: 10.3 g/dL — ABNORMAL LOW (ref 13.0–17.0)
Immature Granulocytes: 1 %
Lymphocytes Relative: 8 %
Lymphs Abs: 0.7 K/uL (ref 0.7–4.0)
MCH: 37.7 pg — ABNORMAL HIGH (ref 26.0–34.0)
MCHC: 32.8 g/dL (ref 30.0–36.0)
MCV: 115 fL — ABNORMAL HIGH (ref 80.0–100.0)
Monocytes Absolute: 0.8 K/uL (ref 0.1–1.0)
Monocytes Relative: 8 %
Neutro Abs: 7.9 K/uL — ABNORMAL HIGH (ref 1.7–7.7)
Neutrophils Relative %: 82 %
Platelets: 150 K/uL (ref 150–400)
RBC: 2.73 MIL/uL — ABNORMAL LOW (ref 4.22–5.81)
RDW: 18.2 % — ABNORMAL HIGH (ref 11.5–15.5)
WBC: 9.6 K/uL (ref 4.0–10.5)
nRBC: 0.3 % — ABNORMAL HIGH (ref 0.0–0.2)

## 2024-04-20 LAB — RENAL FUNCTION PANEL
Albumin: 3.3 g/dL — ABNORMAL LOW (ref 3.5–5.0)
Anion gap: 17 — ABNORMAL HIGH (ref 5–15)
BUN: 82 mg/dL — ABNORMAL HIGH (ref 8–23)
CO2: 22 mmol/L (ref 22–32)
Calcium: 9.1 mg/dL (ref 8.9–10.3)
Chloride: 96 mmol/L — ABNORMAL LOW (ref 98–111)
Creatinine, Ser: 8.05 mg/dL — ABNORMAL HIGH (ref 0.61–1.24)
GFR, Estimated: 6 mL/min — ABNORMAL LOW (ref >60)
Glucose, Bld: 127 mg/dL — ABNORMAL HIGH (ref 70–99)
Phosphorus: 5.4 mg/dL — ABNORMAL HIGH (ref 2.5–4.6)
Potassium: 4.7 mmol/L (ref 3.5–5.1)
Sodium: 135 mmol/L (ref 135–145)

## 2024-04-20 MED ORDER — CHLORHEXIDINE GLUCONATE CLOTH 2 % EX PADS
6.0000 | MEDICATED_PAD | Freq: Every day | CUTANEOUS | Status: DC
  Administered 2024-04-22 – 2024-04-25 (×4): 6 via TOPICAL

## 2024-04-20 MED ORDER — SODIUM CHLORIDE 0.9 % IV SOLN
1.0000 g | Freq: Once | INTRAVENOUS | Status: AC
  Administered 2024-04-21: 1 g via INTRAVENOUS
  Filled 2024-04-20: qty 1000

## 2024-04-20 NOTE — Plan of Care (Signed)

## 2024-04-20 NOTE — Progress Notes (Addendum)
 Contacted by CM/SW regarding need to change out-pt HD clinics. Pt currently receives care at [facility]. Per SW, SNF has been selected in [place], [place]. This SNF will only transport MWF to [facility]. Will work on [employer].  Joshua Riley Dialysis Navigator [phone]  Addendum 3:01pm CLIP to [facility] has been completed for [facility] confirm they have a MWF spot. Only thing still needed is hep b core, this has been ordered. Navigator has notified both clinics that someone will be reaching out from [facility]. Navigator will update when update is available.

## 2024-04-20 NOTE — Progress Notes (Signed)
 [facility] Progress Note   Subjective:    Seen and examined patient at bedside. Currently sitting up in recliner. Informed he now agrees with SNF placement. Given high HD census today, his next HD is scheduled for tomorrow. Current K+ is now stable but need to watch trend. He refuses being on a renal diet.  Objective Vitals:   04/20/24 0059 04/20/24 0433 04/20/24 0920 04/20/24 0951  BP: 110/88 103/65  (!) 94/57  Pulse: 90 73 73 61  Resp:   16 14  Temp: 98 F (36.7 C) (!) 97.5 F (36.4 C)  (!) 97.3 F (36.3 C)  TempSrc: Oral Oral  Oral  SpO2: 95% 92% 92% 100%  Weight:      Height:       Physical Exam General: Elderly male; sitting up in recliner; awake, alert, NAD, RA Heart: S1 and S2; No murmurs, gallops, or rubs Lungs: Clear throughout; No wheezing, rales, or rhonchi Abdomen: Soft and non-tender Extremities: No LE edema Dialysis Access: L AVG   Filed Weights   04/18/24 0549 04/18/24 1245 04/18/24 1553  Weight: 77 kg 67.4 kg 67.4 kg    Intake/Output Summary (Last 24 hours) at 04/20/2024 1538 Last data filed at 04/20/2024 1328 Gross per 24 hour  Intake --  Output 550 ml  Net -550 ml    Additional Objective Labs: Basic Metabolic Panel: Recent Labs  Lab 04/18/24 0558 04/18/24 0725 04/19/24 0413 04/20/24 0807  NA 138  --  134* 135  K 5.6*  --  5.5* 4.7  CL 100  --  95* 96*  CO2 20*  --  22 22  GLUCOSE 115*  --  133* 127*  BUN 89*  --  48* 82*  CREATININE 8.71* 8.76* 5.73* 8.05*  CALCIUM  9.8  --  8.8* 9.1  PHOS  --   --   --  5.4*   Liver Function Tests: Recent Labs  Lab 04/18/24 0558 04/20/24 0807  AST 25  --   ALT 17  --   ALKPHOS 112  --   BILITOT 1.2  --   PROT 6.7  --   ALBUMIN  3.3* 3.3*   Recent Labs  Lab 04/18/24 0558  LIPASE 81*   CBC: Recent Labs  Lab 04/14/24 1127 04/15/24 0213 04/18/24 0558 04/18/24 0725 04/20/24 0807  WBC 7.2 8.5 9.3 9.2 9.6  NEUTROABS  --   --  7.9*  --  7.9*  HGB 10.6* 10.1* 10.5* 10.0*  10.3*  HCT 32.9* 31.4* 32.9* 31.6* 31.4*  MCV 116.7* 116.7* 116.3* 116.6* 115.0*  PLT 127* 133* 123* 120* 150   Blood Culture    Component Value Date/Time   SDES BLOOD SITE NOT SPECIFIED 04/18/2024 0606   SPECREQUEST  04/18/2024 0606    BOTTLES DRAWN AEROBIC AND ANAEROBIC Blood Culture adequate volume   CULT  04/18/2024 0606    NO GROWTH 2 DAYS Performed at [facility], [address] [place], [place] [address]    REPTSTATUS PENDING 04/18/2024 9393    Cardiac Enzymes: No results for input(s): CKTOTAL, CKMB, CKMBINDEX, TROPONINI in the last 168 hours. CBG: No results for input(s): GLUCAP in the last 168 hours. Iron Studies: No results for input(s): IRON, TIBC, TRANSFERRIN, FERRITIN in the last 72 hours. Lab Results  Component Value Date   INR 1.2 03/15/2024   INR 1.2 02/23/2024   INR 1.3 (H) 01/25/2024   Studies/Results: DG CHEST PORT 1 VIEW Result Date: 04/19/2024 EXAM: 1 VIEW(S) XRAY OF THE CHEST  04/19/2024 01:01:33 AM COMPARISON: Portable chest x-ray 04/11/2024. CLINICAL HISTORY: 201257 ESRD (end stage renal disease) (HCC) [id number]. Shob/ low Oxygen levels. FINDINGS: LINES, TUBES AND DEVICES: A right subclavian venous stent is again shown. LUNGS AND PLEURA: No focal pulmonary opacity. Slight interstitial edema in the lung bases, findings improved. There is a small right pleural effusion. Linear atelectasis in both lung bases. No focal pneumonia is seen. No pneumothorax. HEART AND MEDIASTINUM: There is mild cardiomegaly. The mediastinum is stable with mild aortic tortuosity and atherosclerosis. There is mild prominence in the central vasculature, improved. BONES AND SOFT TISSUES: There is osteopenia with thoracic spondylosis. No acute osseous abnormality. IMPRESSION: 1. Small right pleural effusion, not significantly changed . 2. Mild pulmonary interstitial edema with mild central vascular congestion, improved. 3. No focal pneumonia is evident . Bilateral  basal linear atelectasis. Electronically signed by: Francis Quam MD 04/19/2024 01:13 AM EDT RP Workstation: [id number]    Medications:  ertapenem       apixaban   2.5 mg Oral BID   atorvastatin   20 mg Oral Daily   calcitRIOL   0.5 mcg Oral Q M,W,F-HD   Chlorhexidine  Gluconate Cloth  6 each Topical Q0600   dorzolamide   1 drop Right Eye QHS   fludrocortisone   0.1 mg Oral Daily   fluticasone  furoate-vilanterol  1 puff Inhalation Daily   hydrocortisone   10 mg Oral Q24H   hydrocortisone   20 mg Oral Q24H   liver oil-zinc oxide   Topical BID   midodrine   30 mg Oral TID   pantoprazole   40 mg Oral BID   ramelteon   8 mg Oral QHS   timolol   1 drop Both Eyes BID    Dialysis Orders: MWF [facility] 2h   B400  72kg   2K bath   AVG    Heparin  none   Home bp meds: Midodrine  30mg  tid  Assessment/Plan: AMS: Returned to baseline today. CT head negative. Per pmd.  ESRD: on HD MWF. Given high HD census for today, next HD on 10/16. Hyperkalemia: K+ in 5s lately. He refuses a renal diet. Current K+ now is stable. Monitor trends closely. Consider Lok[facility]  on non-HD days as first step if no improvement. BP: chronic hypotension on high dose midodrine  30 tid and hydrocortisone  daily for adrenal failure.  Volume: no edema on exam, on RA. Follow.  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  Charmaine Piety, NP [facility] 04/20/2024,3:38 PM  LOS: 0 days

## 2024-04-20 NOTE — Progress Notes (Signed)
 Antibiotic update   Spoke with Dr. Judeth regarding antibiotic history for this patient.   03/16/2024 abscess culture showed MSSA, Klebsiella pneumonia, E. Coli, Streptococcus anginosis.  03/31/2024 Dr. Luiz prescribed ertapenem  for this infection of presacral/pelvic abscess Plan to change him to receive IV abtx with ESRD- M-W-F- switch 4 wk of ertapenem  1gm.    Vancomycin  has been noted in multiple progress notes, but this was not part of Infectious Diseases original plan and would not offer additional / needed coverage for his polymicrobial MSSA / Klebsiella pneumonia, E. Coli, Streptococcus anginosis infection. These organisms are covered by ertapenem .   Vancomycin  okay to discontinue at this time per discussion with Hospitalist.  Rankin Sams, PharmD, BCPS, BCCCP Clinical Pharmacist

## 2024-04-20 NOTE — Progress Notes (Signed)
 PROGRESS NOTE   Joshua Riley  FMW:[id number]    DOB: 1935-09-16    DOA: 04/18/2024  PCP: Katrinka Garnette KIDD, MD   I have briefly reviewed patients previous medical records in [facility].   Brief Hospital Course:  88 y.o. 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   Acute Encephalopathy has resolved.  Patient initially had declined SNF and wanted to go home but then after discussing with family, has agreed for SNF.  Awaiting SNF bed.   Assessment & Plan:   Acute metabolic encephalopathy Etiology unclear but appears to have resolved and this may have been temporary.  Orthostatic hypotension, chronic As per prior TRH MD note, not uncommon for him to have SBP in the high 80s to 90s.  On midodrine  30 mg 3 times daily.  Also on Florinef  0.1 mg daily. Had transient hypotension yesterday afternoon after missing afternoon dose of midodrine  which then improved. 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 Seen in ID clinic on 03/31/2024.  Remains on IV Invanz  and vancomycin  through 04/29/2024 Outpatient ID clinic follow-up.  Adrenal insufficiency, chronic On hydrocortisone   twice daily.  Not really sure if he needs fludrocortisone  in addition to hydrocortisone .  Fludrocortisone  appears to have been started yesterday and will probably stop it.  Paroxysmal atrial fibrillation Not on rate control medications due to chronic hypotension.  During recent patient, amiodarone  was discontinued due to bradycardia. Remains on apixaban .  Extensive bruising noted  Ulcerative colitis S/p total colectomy and colostomy Stable  ESRD on HD Nephrology on board, had HD on 10/13 with plans for HD today and potential DC to SNF thereafter pending bed  S/p left percutaneous nephrostomy Appears chronic and changed every 2 to 3 months  Multiple pressure injuries of skin Please refer to details in DC summary done by Dr. Camellia Door, TRH MD on 10/14 for details. Continue wound care.  WOC RN seen during prior admission on 10/8.  Anemia of chronic disease and ESRD Stable.    Body mass index is 22.59 kg/m.   DVT prophylaxis: apixaban  (ELIQUIS ) tablet 2.5 mg Start: 04/18/24 2200     Code Status: Limited: Do not attempt resuscitation (DNR) -DNR-LIMITED -Do Not Intubate/DNI :  Family Communication: None at bedside Disposition:  Status is: Observation The patient remains OBS appropriate and will d/c before 2 midnights.  Patient is medically stable and awaiting DC to SNF pending bed.     Consultants:   Nephrology  Procedures:   Hemodialysis  Subjective:  Seen this morning.  Patient denies complaints and states that he just wants to get out of here and remains agreeable to go to SNF.  Objective:   Vitals:   04/20/24 0059 04/20/24 0433 04/20/24 0920 04/20/24 0951  BP: 110/88 103/65  (!) 94/57  Pulse: 90 73 73 61  Resp:  16 14  Temp: 98 F (36.7 C) (!) 97.5 F (36.4 C)  (!) 97.3 F (36.3 C)  TempSrc: Oral Oral  Oral  SpO2: 95% 92% 92% 100%  Weight:      Height:        General exam: Elderly male, moderately built and frail, chronically ill looking, sitting up  comfortably in reclining chair without distress. Respiratory system: Rightly diminished breath sounds in the bases but otherwise clear to auscultation. Cardiovascular system: S1 & S2 heard, RRR. No JVD, murmurs, rubs, gallops or clicks. No pedal edema. Gastrointestinal system: Abdomen is nondistended, soft and nontender. No organomegaly or masses felt. Normal bowel sounds heard.  Has right-sided colostomy with formed brown stools.  No blood or melena. Central nervous system: Alert and oriented. No focal neurological deficits. Extremities: Symmetric 5 x 5 power. Skin: Extensive bruising, especially of right upper extremity, right shoulder.  Spider nevi on upper anterior chest.  Left lower back percutaneous drain. Psychiatry: Judgement and insight appear normal. Mood & affect appropriate.     Data Reviewed:   I have personally reviewed following labs and imaging studies   CBC: Recent Labs  Lab 04/18/24 0558 04/18/24 0725 04/20/24 0807  WBC 9.3 9.2 9.6  NEUTROABS 7.9*  --  7.9*  HGB 10.5* 10.0* 10.3*  HCT 32.9* 31.6* 31.4*  MCV 116.3* 116.6* 115.0*  PLT 123* 120* 150    Basic Metabolic Panel: Recent Labs  Lab 04/14/24 1127 04/15/24 0213 04/18/24 0558 04/18/24 0725 04/19/24 0413 04/20/24 0807  NA 136 134* 138  --  134* 135  K 4.6 5.3* 5.6*  --  5.5* 4.7  CL 101 97* 100  --  95* 96*  CO2 17* 21* 20*  --  22 22  GLUCOSE 83 140* 115*  --  133* 127*  BUN 59* 75* 89*  --  48* 82*  CREATININE 6.45* 7.82* 8.71* 8.76* 5.73* 8.05*  CALCIUM  8.7* 8.8* 9.8  --  8.8* 9.1  PHOS  --   --   --   --   --  5.4*    Liver Function Tests: Recent Labs  Lab 04/18/24 0558 04/20/24 0807  AST 25  --   ALT 17  --   ALKPHOS 112  --   BILITOT 1.2  --   PROT 6.7  --   ALBUMIN  3.3* 3.3*    CBG: No results for input(s): GLUCAP in the last 168 hours.  Microbiology Studies:   Recent Results (from the past 240 hours)  Blood Culture (routine x 2)     Status: None   Collection Time:  04/11/24 10:12 PM   Specimen: BLOOD  Result Value Ref Range Status   Specimen Description   Final    BLOOD LEFT ANTECUBITAL Performed at [facility], [address], [place], [place] [address]    Special Requests   Final    BOTTLES DRAWN AEROBIC AND ANAEROBIC Blood Culture adequate volume Performed at [facility], [address], [place], [place] [address]    Culture   Final    NO GROWTH 5 DAYS Performed at [facility], [address], [place], [place] [address]    Report Status 04/17/2024 FINAL  Final  Blood Culture (routine x 2)     Status: None   Collection Time: 04/12/24  8:30 PM   Specimen: BLOOD RIGHT ARM  Result Value Ref Range Status   Specimen Description BLOOD RIGHT ARM  Final   Special Requests  Final    BOTTLES DRAWN AEROBIC AND ANAEROBIC Blood Culture adequate volume   Culture   Final    NO GROWTH 5 DAYS Performed at [facility], [address] [place], [place] [address]    Report Status 04/17/2024 FINAL  Final  Culture, blood (routine x 2)     Status: None (Preliminary result)   Collection Time: 04/18/24  6:01 AM   Specimen: BLOOD  Result Value Ref Range Status   Specimen Description BLOOD SITE NOT SPECIFIED  Final   Special Requests   Final    BOTTLES DRAWN AEROBIC AND ANAEROBIC Blood Culture results may not be optimal due to an inadequate volume of blood received in culture bottles   Culture   Final    NO GROWTH 2 DAYS Performed at [facility], [address] [place], [place] [address]    Report Status PENDING  Incomplete  Resp panel by RT-PCR (RSV, Flu A&B, Covid) Anterior Nasal Swab     Status: None   Collection Time: 04/18/24  6:01 AM   Specimen: Anterior Nasal Swab  Result Value Ref Range Status   SARS Coronavirus 2 by RT PCR NEGATIVE NEGATIVE Final   Influenza A by PCR NEGATIVE NEGATIVE Final   Influenza B by PCR NEGATIVE NEGATIVE Final    Comment: (NOTE) The Xpert Xpress  SARS-CoV-2/FLU/RSV plus assay is intended as an aid in the diagnosis of influenza from Nasopharyngeal swab specimens and should not be used as a sole basis for treatment. Nasal washings and aspirates are unacceptable for Xpert Xpress SARS-CoV-2/FLU/RSV testing.  Fact Sheet for Patients: BloggerCourse.com  Fact Sheet for Healthcare Providers: SeriousBroker.it  This test is not yet approved or cleared by the [place]  FDA and has been authorized for detection and/or diagnosis of SARS-CoV-2 by FDA under an Emergency Use Authorization (EUA). This EUA will remain in effect (meaning this test can be used) for the duration of the COVID-19 declaration under Section 564(b)(1) of the Act, 21 U.S.C. section 360bbb-3(b)(1), unless the authorization is terminated or revoked.     Resp Syncytial Virus by PCR NEGATIVE NEGATIVE Final    Comment: (NOTE) Fact Sheet for Patients: BloggerCourse.com  Fact Sheet for Healthcare Providers: SeriousBroker.it  This test is not yet approved or cleared by the [place]  FDA and has been authorized for detection and/or diagnosis of SARS-CoV-2 by FDA under an Emergency Use Authorization (EUA). This EUA will remain in effect (meaning this test can be used) for the duration of the COVID-19 declaration under Section 564(b)(1) of the Act, 21 U.S.C. section 360bbb-3(b)(1), unless the authorization is terminated or revoked.  Performed at [facility], [address], [place], [place] [address]   Culture, blood (routine x 2)     Status: None (Preliminary result)   Collection Time: 04/18/24  6:06 AM   Specimen: BLOOD  Result Value Ref Range Status   Specimen Description BLOOD SITE NOT SPECIFIED  Final   Special Requests   Final    BOTTLES DRAWN AEROBIC AND ANAEROBIC Blood Culture adequate volume   Culture   Final    NO GROWTH 2 DAYS Performed at  [facility], [address], [place], [place] [address]    Report Status PENDING  Incomplete    Radiology Studies:  DG CHEST PORT 1 VIEW Result Date: 04/19/2024 EXAM: 1 VIEW(S) XRAY OF THE CHEST 04/19/2024 01:01:33 AM COMPARISON: Portable chest x-ray 04/11/2024. CLINICAL HISTORY: 201257 ESRD (end stage renal disease) (HCC) [id number].  Shob/ low Oxygen levels. FINDINGS: LINES, TUBES AND DEVICES: A right subclavian venous stent is again shown. LUNGS AND PLEURA: No focal pulmonary opacity. Slight interstitial edema in the lung bases, findings improved. There is a small right pleural effusion. Linear atelectasis in both lung bases. No focal pneumonia is seen. No pneumothorax. HEART AND MEDIASTINUM: There is mild cardiomegaly. The mediastinum is stable with mild aortic tortuosity and atherosclerosis. There is mild prominence in the central vasculature, improved. BONES AND SOFT TISSUES: There is osteopenia with thoracic spondylosis. No acute osseous abnormality. IMPRESSION: 1. Small right pleural effusion, not significantly changed . 2. Mild pulmonary interstitial edema with mild central vascular congestion, improved. 3. No focal pneumonia is evident . Bilateral basal linear atelectasis. Electronically signed by: Francis Quam MD 04/19/2024 01:13 AM EDT RP Workstation: [id number]    Scheduled Meds:    apixaban   2.5 mg Oral BID   atorvastatin   20 mg Oral Daily   calcitRIOL   0.5 mcg Oral Q M,W,F-HD   Chlorhexidine  Gluconate Cloth  6 each Topical Q0600   dorzolamide   1 drop Right Eye QHS   fludrocortisone   0.1 mg Oral Daily   fluticasone  furoate-vilanterol  1 puff Inhalation Daily   hydrocortisone   10 mg Oral Q24H   hydrocortisone   20 mg Oral Q24H   liver oil-zinc oxide   Topical BID   midodrine   30 mg Oral TID   pantoprazole   40 mg Oral BID   ramelteon   8 mg Oral QHS   timolol   1 drop Both Eyes BID    Continuous Infusions:    ertapenem      vancomycin        LOS: 0 days     Trenda Mar, MD,  FACP, [facility], [facility], [facility]   [facility] [place]      To contact the attending provider between 7A-7P or the covering provider during after hours 7P-7A, please log into the web site [email or web address] and access using universal [place] password for that web site. If you do not have the password, please call the hospital operator.  04/20/2024, 10:30 AM

## 2024-04-20 NOTE — Progress Notes (Signed)
 Occupational Therapy Treatment Patient Details Name: Joshua Riley MRN: [id number] DOB: 1936-05-02 Today's Date: 04/20/2024   History of present illness 88 y.o. male presents  to ED via EMS for altered mental status and fall.  Recent hospital admission 10/6 to 10/10 for  BLE edema and volume overload. PMH - ESRD on HD MWF, afib, CVA, depression, colectomy, ileostomy, chronic hypotension, DVT, acute pericarditis, groin abscess with lt transgluteal drain, nephrostomy tubes, chf, copd.   OT comments  Pt making limited progress towards OT goals this session. Pt reports not sleeping well last night and increased fatigue - but still willing to work with therapy. He was able to complete grooming at sink - but required seated rest break due to decreased activity tolerance. He required cues for problem solving how to open unfamiliar containers and wrappings. Pt then was motivated to ambulate in hallway - which he did. After return to recliner Pt required max A to problem solve, sequence, and order lunch. Pt continues to demonstrate decreased safety and line awareness, decreased problem solving and reasoning, and post-acute rehab is essential to maximize safety and independence in ADL and functional transfers.      If plan is discharge home, recommend the following:  A lot of help with bathing/dressing/bathroom;Assistance with cooking/housework;Direct supervision/assist for medications management;Direct supervision/assist for financial management;Assist for transportation   Equipment Recommendations  None recommended by OT    Recommendations for Other Services      Precautions / Restrictions Precautions Precautions: Fall Recall of Precautions/Restrictions: Impaired Precaution/Restrictions Comments: chronic hypotension; ileostomyl gluteal drain; nephrostomy Restrictions Weight Bearing Restrictions Per Provider Order: No Other Position/Activity Restrictions: no line awareness       Mobility Bed  Mobility               General bed mobility comments: OOB in recliner at beginning and end of session    Transfers Overall transfer level: Needs assistance Equipment used: Rolling walker (2 wheels) Transfers: Sit to/from Stand Sit to Stand: Contact guard assist           General transfer comment: line management     Balance Overall balance assessment: Needs assistance Sitting-balance support: Feet supported, No upper extremity supported Sitting balance-Leahy Scale: Good     Standing balance support: Bilateral upper extremity supported, Reliant on assistive device for balance Standing balance-Leahy Scale: Poor                             ADL either performed or assessed with clinical judgement   ADL Overall ADL's : Needs assistance/impaired     Grooming: Supervision/safety;Wash/dry face;Oral care;Sitting Grooming Details (indicate cue type and reason): Pt unable to maintain standing this session due to fatigue, requested seated position             Lower Body Dressing: Contact guard assist;Sit to/from stand   Toilet Transfer: Contact guard assist;Ambulation;Rolling walker (2 wheels) Toilet Transfer Details (indicate cue type and reason): simulated through in room mobility with RW         Functional mobility during ADLs: Contact guard assist;Rolling walker (2 wheels) General ADL Comments: Pt with decreased safety awareness, balance, line awareness, decreased activity tolerance.    Extremity/Trunk Assessment              Vision   Vision Assessment?: Wears glasses for reading   Perception     Praxis     Communication Communication Communication: Impaired Factors Affecting Communication: Hearing impaired (wears hearing  aides)   Cognition Arousal: Alert Behavior During Therapy: WFL for tasks assessed/performed Cognition: Cognition impaired, No family/caregiver present to determine baseline   Orientation impairments:  Situation Awareness: Intellectual awareness intact, Online awareness impaired Memory impairment (select all impairments): Working Civil [employer] fast streamer, Conservation officer, historic buildings Attention impairment (select first level of impairment): Selective attention Executive functioning impairment (select all impairments): Reasoning, Problem solving OT - Cognition Comments: Pt tasked with ordering his own lunch. provided with menu and shown where number was. Pt ultimately required max A as he could not figure out how to dial numbers, and then also he had trouble hearing once the call went through. He states that his daughter manages his meds for him and puts them in a pill box with AM, afternoon, PM. No line awarenss for all his drains and bags                 Following commands: Intact        Cueing   Cueing Techniques: Verbal cues  Exercises      Shoulder Instructions       General Comments      Pertinent Vitals/ Pain       Pain Assessment Pain Assessment: Faces Faces Pain Scale: No hurt Pain Intervention(s): Monitored during session, Repositioned  Home Living                                          Prior Functioning/Environment              Frequency  Min 2X/week        Progress Toward Goals  OT Goals(current goals can now be found in the care plan section)  Progress towards OT goals: Progressing toward goals  Acute Rehab OT Goals Patient Stated Goal: have a martini OT Goal Formulation: With patient Time For Goal Achievement: 05/02/24 Potential to Achieve Goals: Good  Plan      Co-evaluation                 AM-PAC OT 6 Clicks Daily Activity     Outcome Measure   Help from another person eating meals?: None Help from another person taking care of personal grooming?: A Little Help from another person toileting, which includes using toliet, bedpan, or urinal?: A Little Help from another person bathing (including washing, rinsing, drying)?:  A Little Help from another person to put on and taking off regular upper body clothing?: A Little Help from another person to put on and taking off regular lower body clothing?: A Little 6 Click Score: 19    End of Session Equipment Utilized During Treatment: Gait belt;Rolling walker (2 wheels)  OT Visit Diagnosis: Unsteadiness on feet (R26.81);Muscle weakness (generalized) (M62.81);History of falling (Z91.81);Other symptoms and signs involving cognitive function   Activity Tolerance Patient tolerated treatment well   Patient Left in chair;with call bell/phone within reach;Other (comment) (RW on other side of room)   Nurse Communication Mobility status        Time: [phone] OT Time Calculation (min): 32 min  Charges: OT General Charges $OT Visit: 1 Visit OT Treatments $Self Care/Home Management : 8-22 mins $Therapeutic Activity: 8-22 mins  Leita DEL OTR/L Acute Rehabilitation Services Office: [phone]  Leita PARAS [facility] 04/20/2024, 11:50 AM

## 2024-04-20 NOTE — TOC Progression Note (Addendum)
 Transition of Care [facility]) - Progression Note    Patient Details  Name: Joshua Riley MRN: [id number] Date of Birth: [birth year]  [facility] CM/SW Contact  Joshua Riley Saa, LCSWA Phone Number: 04/20/2024, 11:43 AM  Clinical Narrative:     11:43 AM CSW introduced self and role to patient. CSW provided patient current SNF options ([facility], [facility], [facility]) with their quarterly Medicare ratings. Patient deferred SNF decision to patient's daughter, Joshua Riley. Joshua Riley spoke with CSW and patient over speaker phone. CSW informed Joshua Riley of paitnet'sa SNF options. Joshua Riley decliend patient's SNF options due to Medicare ratings and stated that she would not accept a SNF with a Medicare ratings of less than four stars. CSW expanded SNF search and followed up with SNFs with pending bed offer and and minimum of four star Medicare rating. CSW will continue to follow.  12:37 PM CSW attempted to contact [facility], [place], and [place] SNF admissions, but there were no responses and voicemail's were left. CSW sent patient's referral to [facility] SNF and [facility]. CSW will continue to follow.  2:17 PM CSW returned to patient's bedside to provide additional SNF bed offers ([facility], [facility]) and informed patient of need to change dialysis clinics. Patient expressed interest in returning to current outpatient HD clinic upon discharge from SNF. CSW relayed interest to Renal Navigator. CSW informed patient's daughter, Joshua Riley, of bed offers. Joshua Riley accepted bed [facility] at [facility]. Patient and Renal Navigator made aware. CSW will continue to follow.  Expected Discharge Plan: Skilled Nursing Facility Barriers to Discharge: Continued Medical Work up               Expected Discharge Plan and Services In-house Referral: Clinical Social Work Discharge Planning Services: CM Consult Post Acute  Care Choice: Skilled Nursing Facility Living arrangements for the past 2 months: Apartment Expected Discharge Date: 04/19/24                                     Social Drivers of Health (SDOH) Interventions SDOH Screenings   Food Insecurity: No Food Insecurity (04/18/2024)  Housing: Low Risk  (04/18/2024)  Transportation Needs: No Transportation Needs (04/18/2024)  Utilities: Not At Risk (04/18/2024)  Alcohol Screen: Low Risk  (10/26/2023)  Depression (PHQ2-9): Medium Risk (04/07/2024)  Financial Resource Strain: Low Risk  (10/26/2023)  Physical Activity: Insufficiently Active (10/26/2023)  Social Connections: Patient Declined (04/18/2024)  Recent Concern: Social Connections - Socially Isolated (01/27/2024)  Stress: Stress Concern Present (10/26/2023)  Tobacco Use: Medium Risk (04/18/2024)    Readmission Risk Interventions    12/03/2023   11:30 AM 11/10/2023    3:46 PM 07/21/2023   12:26 PM  Readmission Risk Prevention Plan  Transportation Screening Complete Complete Complete  PCP or Specialist Appt within 5-7 Days  Complete   Home Care Screening  Complete   Medication Review (RN CM)  Complete   HRI or Home Care Consult   Complete  Social Work Consult for Recovery Care Planning/Counseling   Complete  Palliative Care Screening   Not Applicable  Medication Review Oceanographer)   Complete  SW Recovery Care/Counseling Consult Complete    Palliative Care Screening Complete    Skilled Nursing Facility Complete

## 2024-04-20 NOTE — Progress Notes (Signed)
 Physical Therapy Treatment Patient Details Name: Joshua Riley MRN: [id number] DOB: 10/28/1935 Today's Date: 04/20/2024   History of Present Illness 88 y.o. male presents  to ED via EMS for altered mental status and fall.  Recent hospital admission 10/6 to 10/10 for  BLE edema and volume overload. PMH - ESRD on HD MWF, afib, CVA, depression, colectomy, ileostomy, chronic hypotension, DVT, acute pericarditis, groin abscess with lt transgluteal drain, nephrostomy tubes, chf, copd.    PT Comments  Continuing work on functional mobility and activity tolerance; patient seemed more fatigued today, but still agreed to get up and walk; noting that his nephrostomy tube and trans gluteal drains were not secured before he began to stand up; had to assist patient with all aspects of line management; also that patient did not recognize this PT from yesterday; continue to recommend post acute rehab to maximize independence and safety with functional mobility and activities of daily living    If plan is discharge home, recommend the following: A little help with walking and/or transfers;A little help with bathing/dressing/bathroom;Assistance with cooking/housework;Assist for transportation;Help with stairs or ramp for entrance   Can travel by private vehicle        Equipment Recommendations  None recommended by PT    Recommendations for Other Services       Precautions / Restrictions Precautions Precautions: Fall Recall of Precautions/Restrictions: Impaired Precaution/Restrictions Comments: chronic hypotension; ileostomyl gluteal drain; nephrostomy Restrictions Weight Bearing Restrictions Per Provider Order: No Other Position/Activity Restrictions: no line awareness     Mobility  Bed Mobility               General bed mobility comments: OOB in recliner at beginning and end of session    Transfers Overall transfer level: Needs assistance Equipment used: Rolling walker (2  wheels) Transfers: Sit to/from Stand Sit to Stand: Contact guard assist           General transfer comment: line management    Ambulation/Gait Ambulation/Gait assistance: Contact guard assist, Min assist (with and without physical contact) Gait Distance (Feet): 60 Feet Assistive device: Rolling walker (2 wheels) Gait Pattern/deviations: Step-through pattern, Trunk flexed       General Gait Details: slowed step-through gait, increased trunk flexion over RW; able to correct with cues, including min assist for RW proximity and management   Stairs             Wheelchair Mobility     Tilt Bed    Modified Rankin (Stroke Patients Only)       Balance Overall balance assessment: Needs assistance Sitting-balance support: Feet supported, No upper extremity supported Sitting balance-Leahy Scale: Good     Standing balance support: Bilateral upper extremity supported, Reliant on assistive device for balance Standing balance-Leahy Scale: Poor                              Communication Communication Communication: Impaired Factors Affecting Communication: Hearing impaired (wears hearing aides)  Cognition Arousal: Alert Behavior During Therapy: WFL for tasks assessed/performed                             Following commands: Intact      Cueing Cueing Techniques: Verbal cues  Exercises      General Comments General comments (skin integrity, edema, etc.): noting stif velcro of strap hoding nephrostomy bag against pt's skin L LE; wrapped kerlix and placed strap over  the kerlix to protect skin      Pertinent Vitals/Pain Pain Assessment Pain Assessment: Faces Faces Pain Scale: No hurt Pain Intervention(s): Monitored during session    Home Living                          Prior Function            PT Goals (current goals can now be found in the care plan section) Acute Rehab PT Goals PT Goal Formulation: With patient Time  For Goal Achievement: 05/03/24 Potential to Achieve Goals: Good Progress towards PT goals: Progressing toward goals    Frequency    Min 2X/week      PT Plan      Co-evaluation              AM-PAC PT 6 Clicks Mobility   Outcome Measure  Help needed turning from your back to your side while in a flat bed without using bedrails?: A Little Help needed moving from lying on your back to sitting on the side of a flat bed without using bedrails?: A Little Help needed moving to and from a bed to a chair (including a wheelchair)?: A Little Help needed standing up from a chair using your arms (e.g., wheelchair or bedside chair)?: A Little Help needed to walk in hospital room?: A Little Help needed climbing 3-5 steps with a railing? : A Lot 6 Click Score: 17    End of Session   Activity Tolerance: Patient tolerated treatment well Patient left: in chair;with call bell/phone within reach;with nursing/sitter in room Nurse Communication: Mobility status PT Visit Diagnosis: Other abnormalities of gait and mobility (R26.89)     Time: 1345-1415 PT Time Calculation (min) (ACUTE ONLY): 30 min  Charges:    $Gait Training: 8-22 mins $Therapeutic Activity: 8-22 mins PT General Charges $$ ACUTE PT VISIT: 1 Visit                     Silvano Currier, PT  Acute Rehabilitation Services Office [phone] Secure Chat welcomed    Silvano VEAR Currier 04/20/2024, 3:05 PM

## 2024-04-21 DIAGNOSIS — G9340 Encephalopathy, unspecified: Secondary | ICD-10-CM | POA: Diagnosis not present

## 2024-04-21 DIAGNOSIS — G9341 Metabolic encephalopathy: Secondary | ICD-10-CM | POA: Diagnosis not present

## 2024-04-21 MED ORDER — LIDOCAINE HCL (PF) 1 % IJ SOLN: 5.0000 mL | INTRAMUSCULAR | Status: DC | PRN

## 2024-04-21 MED ORDER — HEPARIN SODIUM (PORCINE) 1000 UNIT/ML DIALYSIS
1000.0000 [IU] | INTRAMUSCULAR | Status: DC | PRN
Start: 2024-04-21 — End: 2024-04-21

## 2024-04-21 MED ORDER — MIDODRINE HCL 5 MG PO TABS
ORAL_TABLET | ORAL | Status: AC
Start: 2024-04-21 — End: 2024-04-21
  Filled 2024-04-21: qty 6

## 2024-04-21 MED ORDER — ALTEPLASE 2 MG IJ SOLR: 2.0000 mg | Freq: Once | INTRAMUSCULAR | Status: DC | PRN

## 2024-04-21 MED ORDER — MIDODRINE HCL 5 MG PO TABS
30.0000 mg | ORAL_TABLET | Freq: Once | ORAL | Status: AC
  Administered 2024-04-21: 30 mg via ORAL
  Filled 2024-04-21: qty 6

## 2024-04-21 MED ORDER — PENTAFLUOROPROP-TETRAFLUOROETH EX AERO: 1.0000 | INHALATION_SPRAY | CUTANEOUS | Status: DC | PRN

## 2024-04-21 MED ORDER — LIDOCAINE-PRILOCAINE 2.5-2.5 % EX CREA: 1.0000 | TOPICAL_CREAM | CUTANEOUS | Status: DC | PRN

## 2024-04-21 NOTE — Progress Notes (Signed)
 Joshua Riley Progress Note   Subjective:    Seen and examined patient on HD. On high-dose Midodrine  for chronic hypotension. No UF is ordered. Awaiting SNF placement and plan for him to be transferred to [facility] while he's in rehab.  Objective Vitals:   04/21/24 1013 04/21/24 1030 04/21/24 1135 04/21/24 1150  BP: (!) 83/63 (!) 73/56 (!) 88/52 (!) 86/56  Pulse: 83 (!) 43 81 68  Resp: 14 (!) 27 12 (!) 8  Temp:   97.6 F (36.4 C)   TempSrc:      SpO2: 100% 100% 100% 100%  Weight:      Height:       Physical Exam General: Elderly male; awake, alert, NAD, RA Heart: S1 and S2; No murmurs, gallops, or rubs Lungs: Clear anteriorly; No wheezing, rales, or rhonchi Abdomen: Soft and non-tender Extremities: No LE edema Dialysis Access: L AVG   Filed Weights   04/18/24 0549 04/18/24 1245 04/18/24 1553  Weight: 77 kg 67.4 kg 67.4 kg    Intake/Output Summary (Last 24 hours) at 04/21/2024 1211 Last data filed at 04/21/2024 1135 Gross per 24 hour  Intake --  Output 450 ml  Net -450 ml    Additional Objective Labs: Basic Metabolic Panel: Recent Labs  Lab 04/18/24 0558 04/18/24 0725 04/19/24 0413 04/20/24 0807  NA 138  --  134* 135  K 5.6*  --  5.5* 4.7  CL 100  --  95* 96*  CO2 20*  --  22 22  GLUCOSE 115*  --  133* 127*  BUN 89*  --  48* 82*  CREATININE 8.71* 8.76* 5.73* 8.05*  CALCIUM  9.8  --  8.8* 9.1  PHOS  --   --   --  5.4*   Liver Function Tests: Recent Labs  Lab 04/18/24 0558 04/20/24 0807  AST 25  --   ALT 17  --   ALKPHOS 112  --   BILITOT 1.2  --   PROT 6.7  --   ALBUMIN  3.3* 3.3*   Recent Labs  Lab 04/18/24 0558  LIPASE 81*   CBC: Recent Labs  Lab 04/15/24 0213 04/18/24 0558 04/18/24 0725 04/20/24 0807  WBC 8.5 9.3 9.2 9.6  NEUTROABS  --  7.9*  --  7.9*  HGB 10.1* 10.5* 10.0* 10.3*  HCT 31.4* 32.9* 31.6* 31.4*  MCV 116.7* 116.3* 116.6* 115.0*  PLT 133* 123* 120* 150   Blood Culture    Component Value Date/Time    SDES BLOOD SITE NOT SPECIFIED 04/18/2024 0606   SPECREQUEST  04/18/2024 0606    BOTTLES DRAWN AEROBIC AND ANAEROBIC Blood Culture adequate volume   CULT  04/18/2024 0606    NO GROWTH 3 DAYS Performed at [facility], [address], [place], [place] [address]    REPTSTATUS PENDING 04/18/2024 9393    Cardiac Enzymes: No results for input(s): CKTOTAL, CKMB, CKMBINDEX, TROPONINI in the last 168 hours. CBG: No results for input(s): GLUCAP in the last 168 hours. Iron Studies: No results for input(s): IRON, TIBC, TRANSFERRIN, FERRITIN in the last 72 hours. Lab Results  Component Value Date   INR 1.2 03/15/2024   INR 1.2 02/23/2024   INR 1.3 (H) 01/25/2024   Studies/Results: No results found.  Medications:  ertapenem      ertapenem       apixaban   2.5 mg Oral BID   atorvastatin   20 mg Oral Daily   calcitRIOL   0.5 mcg Oral Q M,W,F-HD   Chlorhexidine  Gluconate Cloth  6 each Topical Q0600   dorzolamide   1 drop Right Eye QHS   fludrocortisone   0.1 mg Oral Daily   fluticasone  furoate-vilanterol  1 puff Inhalation Daily   hydrocortisone   10 mg Oral Q24H   hydrocortisone   20 mg Oral Q24H   liver oil-zinc oxide   Topical BID   midodrine   30 mg Oral TID   pantoprazole   40 mg Oral BID   ramelteon   8 mg Oral QHS   timolol   1 drop Both Eyes BID    Dialysis Orders: MWF NW 2h   B400  72kg   2K bath   AVG    Heparin  none   Home bp meds: Midodrine  30mg  tid  Assessment/Plan: AMS: Returned to baseline today. CT head negative. Per pmd.  ESRD: on HD MWF. Given high HD census for MWF group, he is on HD today. Can switch back to MWF schedule next week. Hyperkalemia: K+ in 5s lately. He refuses a renal diet. Current K+ now is stable. Monitor trends closely. Consider Lokelma  on non-HD days as first step if no improvement. BP: chronic hypotension on high dose midodrine  30 tid and hydrocortisone  daily for adrenal failure.  Volume: no edema on exam, on RA. Follow.   Anemia of esrd: Hb 10-12 here, follow Dispo: Awaiting SNF placement, plan to transfer to [facility] while he is in rehab.  Charmaine Piety, NP [facility] 04/21/2024,12:11 PM  LOS: 0 days

## 2024-04-21 NOTE — Plan of Care (Signed)

## 2024-04-21 NOTE — TOC Progression Note (Addendum)
 Transition of Care [facility]) - Progression Note    Patient Details  Name: Joshua Riley MRN: [id number] Date of Birth: January 11, 1936  Transition of [facility]) CM/SW Contact  Lauraine FORBES Saa, LCSWA Phone Number: 04/21/2024, 9:33 AM  Clinical Narrative:     9:33 AM Per Renal Navigator, [facility] admissions will not move forward with patient until hepatitis B CORE lab results are completed. CSW will continue to follow.  11:54 AM CSW returned patient's daughter, Rene's, phone call and provided update on SNF discharge pending lab results for temporary outpatient dialysis chair at [facility]. [name] expressed understanding of the information.   4:24 PM CSW returned Rene's phone call. [name] inquired about SNF placement. CSW informed [name] that chosen SNF was [facility]. [name] inquired about scheduled CT appointment. CSW informed [name] that SNF can transport or [name] could transport herself. [name] expressed understanding of the information.  Expected Discharge Plan: Skilled Nursing Facility Barriers to Discharge: Continued Medical Work up               Expected Discharge Plan and Services In-house Referral: Clinical Social Work Discharge Planning Services: CM Consult Post Acute Care Choice: Skilled Nursing Facility Living arrangements for the past 2 months: Apartment Expected Discharge Date: 04/19/24                                     Social Drivers of Health (SDOH) Interventions SDOH Screenings   Food Insecurity: No Food Insecurity (04/18/2024)  Housing: Low Risk  (04/18/2024)  Transportation Needs: No Transportation Needs (04/18/2024)  Utilities: Not At Risk (04/18/2024)  Alcohol Screen: Low Risk  (10/26/2023)  Depression (PHQ2-9): Medium Risk (04/07/2024)  Financial Resource Strain: Low Risk  (10/26/2023)  Physical Activity: Insufficiently Active (10/26/2023)  Social Connections: Patient Declined (04/18/2024)  Recent Concern: Social Connections - Socially Isolated  (01/27/2024)  Stress: Stress Concern Present (10/26/2023)  Tobacco Use: Medium Risk (04/18/2024)    Readmission Risk Interventions    12/03/2023   11:30 AM 11/10/2023    3:46 PM 07/21/2023   12:26 PM  Readmission Risk Prevention Plan  Transportation Screening Complete Complete Complete  PCP or Specialist Appt within 5-7 Days  Complete   Home Care Screening  Complete   Medication Review (RN CM)  Complete   HRI or Home Care Consult   Complete  Social Work Consult for Recovery Care Planning/Counseling   Complete  Palliative Care Screening   Not Applicable  Medication Review [facility])   Complete  SW Recovery Care/Counseling Consult Complete    Palliative Care Screening Complete    Skilled Nursing Facility Complete

## 2024-04-21 NOTE — Plan of Care (Signed)
   Problem: Education: Goal: Knowledge of General Education information will improve Description Including pain rating scale, medication(s)/side effects and non-pharmacologic comfort measures Outcome: Progressing

## 2024-04-21 NOTE — Progress Notes (Signed)
 PROGRESS NOTE   Joshua Riley  FMW:[id number]    DOB: 03/21/1936    DOA: 04/18/2024  PCP: Katrinka Garnette KIDD, MD   I have briefly reviewed patients previous medical records in [facility].   Brief Hospital Course:  88 y.o. 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   Acute Encephalopathy has resolved.  Patient initially had declined SNF and wanted to go home but then after discussing with family, has agreed for SNF.  Now appears to have an SNF bed and outpatient HD slot continue HD center but is awaiting hepatitis B core antibody lab results without which he cannot be discharged.  DC summary was done by Dr. Laurence on 10/14, this will need to be updated prior to actual discharge.   Assessment & Plan:   Acute metabolic encephalopathy Etiology unclear but appears to have resolved and this may have been temporary.  Orthostatic hypotension, chronic As per prior [facility] MD note, not uncommon for him to have SBP in the high 80s to 90s.  On midodrine  30 mg 3 times daily.  Also on Florinef  0.1 mg daily. Had transient hypotension episodes including this morning with SBP's in the 70s-80s, likely during dialysis.  He did get his midodrine  dose prior to dialysis. However appeared asymptomatic.   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 Seen in ID clinic on 03/31/2024.  Remains on IV Invanz  and vancomycin  through 04/29/2024 Outpatient ID clinic follow-up.  Adrenal insufficiency, chronic On hydrocortisone  twice daily.  As per Dr. Laurence, [facility] MD note from 10/14, from prior DC summary in September 2025, patient was supposed to be on Florinef  0.1 mg daily but apparently was not seen on the DC summary 04/15/2024.  He was started on Florinef  0.1 mg daily this admission.  Paroxysmal atrial fibrillation Not on rate control medications due to chronic hypotension.  During recent admission, amiodarone  was discontinued due to bradycardia. Remains on apixaban .  Extensive bruising noted  Ulcerative colitis S/p total colectomy and colostomy Stable  ESRD on HD Nephrology on board, had HD on 10/13 and 10/15.  S/p left percutaneous nephrostomy Appears chronic and changed every 2 to 3 months  Multiple pressure injuries of skin Please refer to details in DC summary done by Dr. Camellia Laurence, [facility] MD on 10/14 for details. Continue wound care.  WOC RN seen during prior admission on 10/8.  Anemia of chronic disease and ESRD Stable.   Body mass index is 22.59 kg/m.   DVT prophylaxis: apixaban  (ELIQUIS ) tablet 2.5 mg Start: 04/18/24 2200     Code Status: Limited: Do not attempt resuscitation (DNR) -DNR-LIMITED -Do Not Intubate/DNI :  Family Communication: None at bedside Disposition:  Status is: Observation The patient remains OBS appropriate and will d/c before 2 midnights.  Patient is medically stable and awaiting DC to SNF pending lab results as  noted above.     Consultants:   Nephrology  Procedures:   Hemodialysis  Subjective:  Seen this morning at HD.  Denies complaints.  Wanting to be discharged from the hospital soon and is aware that we are awaiting lab test  results.  Objective:   Vitals:   04/21/24 1013 04/21/24 1030 04/21/24 1135 04/21/24 1150  BP: (!) 83/63 (!) 73/56 (!) 88/52 (!) 86/56  Pulse: 83 (!) 43 81 68  Resp: 14 (!) 27 12 (!) 8  Temp:   97.6 F (36.4 C)   TempSrc:      SpO2: 100% 100% 100% 100%  Weight:      Height:        General exam: Elderly male, moderately built and frail, chronically ill looking, lying comfortably supine in bed undergoing hemodialysis this morning. Respiratory system: Slightly diminished breath sounds in the bases but otherwise clear to auscultation. Cardiovascular system: S1 & S2 heard, RRR. No JVD, murmurs, rubs, gallops or clicks. No pedal edema. Gastrointestinal system: Abdomen is nondistended, soft and nontender. No organomegaly or masses felt. Normal bowel sounds heard.  Has right-sided colostomy with formed brown stools.  No blood or melena. Central nervous system: Alert and oriented. No focal neurological deficits. Extremities: Symmetric 5 x 5 power. Skin: Extensive bruising, especially of right upper extremity, right shoulder.  Spider nevi on upper anterior chest.  Left lower back percutaneous drain. Psychiatry: Judgement and insight appear normal. Mood & affect appropriate.     Data Reviewed:   I have personally reviewed following labs and imaging studies   CBC: Recent Labs  Lab 04/18/24 0558 04/18/24 0725 04/20/24 0807  WBC 9.3 9.2 9.6  NEUTROABS 7.9*  --  7.9*  HGB 10.5* 10.0* 10.3*  HCT 32.9* 31.6* 31.4*  MCV 116.3* 116.6* 115.0*  PLT 123* 120* 150    Basic Metabolic Panel: Recent Labs  Lab 04/15/24 0213 04/18/24 0558 04/18/24 0725 04/19/24 0413 04/20/24 0807  NA 134* 138  --  134* 135  K 5.3* 5.6*  --  5.5* 4.7  CL 97* 100  --  95* 96*  CO2 21* 20*  --  22 22  GLUCOSE 140* 115*  --  133* 127*  BUN 75* 89*  --  48* 82*  CREATININE 7.82* 8.71* 8.76* 5.73* 8.05*  CALCIUM  8.8* 9.8  --  8.8* 9.1  PHOS  --   --   --   --  5.4*    Liver Function Tests: Recent  Labs  Lab 04/18/24 0558 04/20/24 0807  AST 25  --   ALT 17  --   ALKPHOS 112  --   BILITOT 1.2  --   PROT 6.7  --   ALBUMIN  3.3* 3.3*    CBG: No results for input(s): GLUCAP in the last 168 hours.  Microbiology Studies:   Recent Results (from the past 240 hours)  Blood Culture (routine x 2)     Status: None   Collection Time: 04/11/24 10:12 PM   Specimen: BLOOD  Result Value Ref Range Status   Specimen Description   Final    BLOOD LEFT ANTECUBITAL Performed at [facility], [address], [place], [place] [address]    Special Requests   Final    BOTTLES DRAWN AEROBIC AND ANAEROBIC Blood Culture adequate volume Performed at [facility], [address] [place], [place] [address]    Culture   Final    NO GROWTH 5 DAYS Performed at [facility],  [address] [place], [place] [address]    Report Status 04/17/2024 FINAL  Final  Blood Culture (routine x 2)     Status: None   Collection Time: 04/12/24  8:30 PM   Specimen: BLOOD RIGHT ARM  Result Value Ref Range Status   Specimen Description BLOOD RIGHT ARM  Final   Special Requests   Final    BOTTLES DRAWN AEROBIC AND ANAEROBIC Blood Culture adequate volume   Culture   Final    NO GROWTH 5 DAYS Performed at [facility], [address], [place], [place] [address]    Report Status 04/17/2024 FINAL  Final  Culture, blood (routine x 2)     Status: None (Preliminary result)   Collection Time: 04/18/24  6:01 AM   Specimen: BLOOD  Result Value Ref Range Status   Specimen Description BLOOD SITE NOT SPECIFIED  Final   Special Requests   Final    BOTTLES DRAWN AEROBIC AND ANAEROBIC Blood Culture results may not be optimal due to an inadequate volume of blood received in culture bottles   Culture   Final    NO GROWTH 3 DAYS Performed at [facility], [address] [place], [place] [address]    Report Status PENDING  Incomplete  Resp panel by RT-PCR (RSV, Flu  A&B, Covid) Anterior Nasal Swab     Status: None   Collection Time: 04/18/24  6:01 AM   Specimen: Anterior Nasal Swab  Result Value Ref Range Status   SARS Coronavirus 2 by RT PCR NEGATIVE NEGATIVE Final   Influenza A by PCR NEGATIVE NEGATIVE Final   Influenza B by PCR NEGATIVE NEGATIVE Final    Comment: (NOTE) The Xpert Xpress SARS-CoV-2/FLU/RSV plus assay is intended as an aid in the diagnosis of influenza from Nasopharyngeal swab specimens and should not be used as a sole basis for treatment. Nasal washings and aspirates are unacceptable for Xpert Xpress SARS-CoV-2/FLU/RSV testing.  Fact Sheet for Patients: BloggerCourse.com  Fact Sheet for Healthcare Providers: SeriousBroker.it  This test is not yet approved or cleared by the [place]  FDA and has been authorized for detection and/or diagnosis of SARS-CoV-2 by FDA under an Emergency Use Authorization (EUA). This EUA will remain in effect (meaning this test can be used) for the duration of the COVID-19 declaration under Section 564(b)(1) of the Act, 21 U.S.C. section 360bbb-3(b)(1), unless the authorization is terminated or revoked.     Resp Syncytial Virus by PCR NEGATIVE NEGATIVE Final    Comment: (NOTE) Fact Sheet for Patients: BloggerCourse.com  Fact Sheet for Healthcare Providers: SeriousBroker.it  This test is not yet approved or cleared by the [place]  FDA and has been authorized for detection and/or diagnosis of SARS-CoV-2 by FDA under an Emergency Use Authorization (EUA). This EUA will remain in effect (meaning this test can be used) for the duration of the COVID-19 declaration under Section 564(b)(1) of the Act, 21 U.S.C. section 360bbb-3(b)(1), unless the authorization is terminated or revoked.  Performed at [facility], [address] [place], [place] [address]   Culture, blood (routine x 2)      Status: None (Preliminary result)   Collection Time: 04/18/24  6:06 AM   Specimen: BLOOD  Result Value Ref Range Status   Specimen Description BLOOD SITE NOT SPECIFIED  Final   Special Requests   Final    BOTTLES DRAWN AEROBIC AND ANAEROBIC Blood Culture adequate volume   Culture   Final    NO  GROWTH 3 DAYS Performed at [facility], [address] [place], [place] [address]    Report Status PENDING  Incomplete    Radiology Studies:  No results found.   Scheduled Meds:    apixaban   2.5 mg Oral BID   atorvastatin   20 mg Oral Daily   calcitRIOL   0.5 mcg Oral Q M,W,F-HD   Chlorhexidine  Gluconate Cloth  6 each Topical Q0600   dorzolamide   1 drop Right Eye QHS   fludrocortisone   0.1 mg Oral Daily   fluticasone  furoate-vilanterol  1 puff Inhalation Daily   hydrocortisone   10 mg Oral Q24H   hydrocortisone   20 mg Oral Q24H   liver oil-zinc oxide   Topical BID   midodrine   30 mg Oral TID   pantoprazole   40 mg Oral BID   ramelteon   8 mg Oral QHS   timolol   1 drop Both Eyes BID    Continuous Infusions:    ertapenem        LOS: 0 days     Trenda Mar, MD,  FACP, [facility], [facility], [facility]   [facility] Ad[facility] [place]      To contact the attending provider between 7A-7P or the covering provider during after hours 7P-7A, please log into the web site [email or web address] and access using universal [place] password for that web site. If you do not have the password, please call the hospital operator.  04/21/2024, 4:13 PM

## 2024-04-21 NOTE — Progress Notes (Addendum)
 Pt has clinic on hold at [facility] for MWF, pt has been financially cleared at this time.   However, [facility] admissions will not move fwd with clinical clearance and scheduling until hep b CORE lab results. Will monitor for this result and submit when applicable.   Navigator has informed admissions that this pt is ready for d/c and to please divert. Will update when possible.   Joshua Riley Dialysis Nav [phone]  Addendum 3:01pm Awaiting hep  core results.

## 2024-04-21 NOTE — Progress Notes (Signed)
  Received patient in bed to unit.   Informed consent signed and in chart.    TX duration:2.45     Transported by  Hand-off given to patient's nurse.    Access used: rt graft femoral Access issues: none   Total UF removed: 0  Medication(s) given: Midodrine  30mg  Post HD VS: 85/56 Post HD weight: 70.5     Wyvonne Carda Renie LPN [facility]

## 2024-04-22 ENCOUNTER — Other Ambulatory Visit (HOSPITAL_COMMUNITY): Payer: Self-pay

## 2024-04-22 DIAGNOSIS — G9341 Metabolic encephalopathy: Secondary | ICD-10-CM | POA: Diagnosis not present

## 2024-04-22 DIAGNOSIS — G9340 Encephalopathy, unspecified: Secondary | ICD-10-CM | POA: Diagnosis not present

## 2024-04-22 LAB — HEPATITIS B CORE ANTIBODY, TOTAL: HEP B CORE AB: NEGATIVE

## 2024-04-22 MED ORDER — CHLORHEXIDINE GLUCONATE CLOTH 2 % EX PADS
6.0000 | MEDICATED_PAD | Freq: Every day | CUTANEOUS | 0 refills | Status: DC
  Filled 2024-04-22: qty 30, 5d supply, fill #0

## 2024-04-22 MED ORDER — CHLORHEXIDINE GLUCONATE CLOTH 2 % EX PADS
6.0000 | MEDICATED_PAD | Freq: Every day | CUTANEOUS | Status: DC
  Administered 2024-04-23 – 2024-04-25 (×3): 6 via TOPICAL

## 2024-04-22 NOTE — Plan of Care (Signed)

## 2024-04-22 NOTE — Progress Notes (Addendum)
 Core lab uploaded to [facility], awaiting feedback from admissions, will update when possible.   Lavanda Fredrickson Dialysis Navigator [phone]  Addendum [phone] Contacted [facility] to please provide feedback in next hour,as pt ready for d/c, will advise.   Addendum [phone]  the patient has been accepted at [facility] with a start date of 10/20 at 10:45AM for paperwork. after 1st treatment, pt will be MWF, 11:45am on going chair time. Care team including hospitalist, SW, and nephrology updated at this time. AVS updated with this info.   Addendum 2:43 Unaware that pt needed iv abx with HD. Will need ertapenem . Clinic [facility] has been contacted and will order this, but unlikely it will be there by Monday. Will update once more info is known.  Addendum 3:31 Clinic ordering ertapenem . Discussed with care team the possibility of transporting the abx to clinic, this will not be feasible because [facility] does not have approval to even give this abx to this pt, this approval is being requested now on their end. They also cannot accept medication from outside agencies. Will continue to assist as possible.

## 2024-04-22 NOTE — Progress Notes (Signed)
 [facility] Progress Note   Subjective:    Seen and examined.  No /co's today Since he is going to a new HD clinic, we need to get the new clinic to special order the IV ertapenam which he needs for another 3 -4 doses (through 10/24)   Objective Vitals:   04/22/24 0340 04/22/24 0739 04/22/24 0924 04/22/24 1356  BP: (!) 115/56  (!) 82/64 125/89  Pulse: 73  73 78  Resp: 16  16 16   Temp: 97.8 F (36.6 C)  (!) 97.4 F (36.3 C) 97.8 F (36.6 C)  TempSrc: Oral  Oral Oral  SpO2: 100% 100% 95% 99%  Weight:      Height:       Physical Exam General: Elderly male; awake, alert, NAD, RA Heart: S1 and S2; No murmurs, gallops, or rubs Lungs: Clear anteriorly; No wheezing, rales, or rhonchi Abdomen: Soft and non-tender Extremities: No LE edema Dialysis Access: L AVG   Filed Weights   04/18/24 0549 04/18/24 1245 04/18/24 1553  Weight: 77 kg 67.4 kg 67.4 kg    Intake/Output Summary (Last 24 hours) at 04/22/2024 1603 Last data filed at 04/22/2024 0900 Gross per 24 hour  Intake 532.08 ml  Output 100 ml  Net 432.08 ml    Additional Objective Labs: Basic Metabolic Panel: Recent Labs  Lab 04/18/24 0558 04/18/24 0725 04/19/24 0413 04/20/24 0807  NA 138  --  134* 135  K 5.6*  --  5.5* 4.7  CL 100  --  95* 96*  CO2 20*  --  22 22  GLUCOSE 115*  --  133* 127*  BUN 89*  --  48* 82*  CREATININE 8.71* 8.76* 5.73* 8.05*  CALCIUM  9.8  --  8.8* 9.1  PHOS  --   --   --  5.4*   Liver Function Tests: Recent Labs  Lab 04/18/24 0558 04/20/24 0807  AST 25  --   ALT 17  --   ALKPHOS 112  --   BILITOT 1.2  --   PROT 6.7  --   ALBUMIN  3.3* 3.3*   Recent Labs  Lab 04/18/24 0558  LIPASE 81*   CBC: Recent Labs  Lab 04/18/24 0558 04/18/24 0725 04/20/24 0807  WBC 9.3 9.2 9.6  NEUTROABS 7.9*  --  7.9*  HGB 10.5* 10.0* 10.3*  HCT 32.9* 31.6* 31.4*  MCV 116.3* 116.6* 115.0*  PLT 123* 120* 150   Blood Culture    Component Value Date/Time   SDES BLOOD  SITE NOT SPECIFIED 04/18/2024 0606   SPECREQUEST  04/18/2024 0606    BOTTLES DRAWN AEROBIC AND ANAEROBIC Blood Culture adequate volume   CULT  04/18/2024 0606    NO GROWTH 4 DAYS Performed at [facility], [address] [place], [place] [address]    REPTSTATUS PENDING 04/18/2024 9393    Cardiac Enzymes: No results for input(s): CKTOTAL, CKMB, CKMBINDEX, TROPONINI in the last 168 hours. CBG: No results for input(s): GLUCAP in the last 168 hours. Iron Studies: No results for input(s): IRON, TIBC, TRANSFERRIN, FERRITIN in the last 72 hours. Lab Results  Component Value Date   INR 1.2 03/15/2024   INR 1.2 02/23/2024   INR 1.3 (H) 01/25/2024   Studies/Results: No results found.  Medications:  ertapenem       apixaban   2.5 mg Oral BID   atorvastatin   20 mg Oral Daily   calcitRIOL   0.5 mcg Oral Q M,W,F-HD   Chlorhexidine  Gluconate Cloth  6 each Topical Q0600  dorzolamide   1 drop Right Eye QHS   fludrocortisone   0.1 mg Oral Daily   fluticasone  furoate-vilanterol  1 puff Inhalation Daily   hydrocortisone   10 mg Oral Q24H   hydrocortisone   20 mg Oral Q24H   liver oil-zinc oxide   Topical BID   midodrine   30 mg Oral TID   pantoprazole   40 mg Oral BID   ramelteon   8 mg Oral QHS   timolol   1 drop Both Eyes BID    Dialysis Orders: MWF NW 2h   B400  72kg   2K bath   AVG    Heparin  none   Home bp meds: Midodrine  30mg  tid  Assessment/Plan: AMS: Returned to baseline.  ESRD: on HD MWF. Had HD Thursday. Refusing HD today. Will plan HD tomorrow.  Hyperkalemia: K+ in 5s lately. He refuses a renal diet. Current K+ now is stable. Monitor trends closely. Consider [facility]  on non-HD days as first step if no improvement. BP: chronic hypotension on high dose midodrine  30 tid and hydrocortisone  daily for adrenal failure.  Volume: no edema on exam, on RA. Follow.  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 Since he is going to a new HD clinic, we need to get the new clinic to special order the IV ertapenam which he needs for another 3 -4 doses (through 10/24 to complete a 4 wk course overall) and they will need to have a delivery date or the antibiotics in hand before we can dc the patient.   Myer Fret  MD  [facility] 04/22/2024, 4:05 PM  Recent Labs  Lab 04/18/24 0558 04/18/24 0725 04/19/24 0413 04/20/24 0807  HGB 10.5* 10.0*  --  10.3*  ALBUMIN  3.3*  --   --  3.3*  CALCIUM  9.8  --  8.8* 9.1  PHOS  --   --   --  5.4*  CREATININE 8.71* 8.76* 5.73* 8.05*  K 5.6*  --  5.5* 4.7    Inpatient medications:  apixaban   2.5 mg Oral BID   atorvastatin   20 mg Oral Daily   calcitRIOL   0.5 mcg Oral Q M,W,F-HD   Chlorhexidine  Gluconate Cloth  6 each Topical Q0600   dorzolamide   1 drop Right Eye QHS   fludrocortisone   0.1 mg Oral Daily   fluticasone  furoate-vilanterol  1 puff Inhalation Daily   hydrocortisone   10 mg Oral Q24H   hydrocortisone   20 mg Oral Q24H   liver oil-zinc oxide   Topical BID   midodrine   30 mg Oral TID   pantoprazole   40 mg Oral BID   ramelteon   8 mg Oral QHS   timolol   1 drop Both Eyes BID    ertapenem      traMADol 

## 2024-04-22 NOTE — Discharge Summary (Addendum)
 Joshua Riley FMW:[id number] DOB: Aug 07, 1935 DOA: 04/18/2024  PCP: Katrinka Garnette KIDD, MD  Admit date: 04/18/2024  Discharge date: 04/22/2024  Admitted From: home   Disposition:  SNF   Recommendations for Outpatient Follow-up:   Follow up with PCP in 1-2 weeks Routine HD three times a week per Neprhology  Home Health: SNF Equipment/Devices: none  Discharge Condition: improved   CODE STATUS: DNR/DNI   Diet Recommendation: Renal with 1500 cal fluid restricin   Diet Order             Diet regular Room service appropriate? Yes; Fluid consistency: Thin; Fluid restriction: 1200 mL Fluid  Diet effective now           Diet renal with fluid restriction                    Chief Complaint  Patient presents with   Altered Mental Status     Brief history of present illness from the day of admission and additional interim summary    88 y.o. 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                                                                    Hospital Course    * Acute metabolic encephalopathy 04/22/24 pt  was altered on admission. Mental status quickly resolved to his baseline. Today, pt is alert and oriented x 4.     Orthostatic hypotension 04/22/24 chronic. Not uncommon for him to have SBP in the high 80s to 90s.  On midodrine  30 mg tid.  Prior dc summary in 03-2024 said that he was suppose to also be on Florinef  01 mg daily. Did not see that on his DC summary from 04-15-2024. Will go ahead and restart Florinef  0.1 mg daily.   **update, per discussed with nephrology Dr. Geralynn, pt's hypotension with SBP in the 80-90s is chronic for him and stable. Pt cleared for discharge today by nephrology.   Bilateral leg edema 04/22/24 chronic. I placed ACE bandage compression wraps to both his legs.   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 On IV Ivanz and Vanco through 04-29-2024.  Saw ID in clinic on 03-31-2024.   Adrenal insufficiency 04/22/24  chronic. On hydrocortisone  bid. 20 mg qam and 10 mg qpm.   Paroxysmal atrial fibrillation (HCC) 04/22/24 stable. On Eliquis . No rate controlling meds due to his chronic hypotension. EKG from admission shows rate controlled afib. In reviewing his prior hospital notes, pt had been placed on IV amiodarone  during 11-2023 admission. Amiodarone  had to be stopped due to subsequent bradycardia.   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 Coli that was cultured from pre-sacral abscess culture from 03-16-2024.  Per last office ID note from 03-31-2024 his last dose of IV antibiotics should be Oct 24th, 2025. Continue IV Ertapenem .. Pharmacy aware.   Ulcerative colitis (HCC) - S/p total colectomy. From his Care Everywhere records, this appears to have been performed in [place] in June 2011 04/22/24 chronic. S/p total colectomy. From his Care Everywhere records, this appears to have been performed in [place] in June 2011   End-stage renal disease on hemodialysis (HCC) 04/22/24 pt seen by nephrology. Had HD  yesterday.   Nephrostomy present (HCC) - left kidney. Care Everywhere records reviewed. appears to bave been ordered by Dr. Dasie Clark in [place] . placed [phone] 04/22/24  left sided. appears to have this changed every 2-3 months.   Colostomy status (HCC) -  Care Everywhere records reviewed. States pt had total colectomy performed in June 2011 in [place] . 04/22/24 pt with ileostomy and prior total colectomy. Presumably from his hx of ulcerative colitis. His surgery was not performed at [facility]. Care Everywhere records reviewed. States pt had total colectomy performed in June 2011 in [place] .     Pressure injury of skin of multiple topographic sites 04/22/24 present on admission. Reason for Consult: LE wounds  Patient was seen by [facility] nursing team last week 10/8; has been DC and returned 10/13 for AMS   Wound type: venous ulcerations in the presence of PAD; apparently Unna's boots have been applied at some point; I did not feel comfortable placing orders last week without an ABI and known history of PAD.   Linear wounds on the left 2nd and 3rd toe; clean and dry; ok to apply xeroform every other day Black wound noted right medial 2nd toe; with history of PAD; continue to paint with betadine daily. Allow to air dry Large akin tear right arm, cleanse with saline, pat dry. Apply single layer of xeroform and change every other day and dry topper dressing.  Skin tear left elbow, skin flap present; attempt to re-aproximate skin flap and steristrip, apply single layer of xeroform gauze and dry dressing  Skin tear left bicep, cleanse with saline, pat dry. Cover with single layer of xeroform and top with dry dressing or foam. Change every other day.  Skin tear left outer bicep; cleanse with saline, pat dry. Apply single layer of xeroform and top with dry dressing. Change every other day.  Dry lesion left shoulder blade See ostomy care below.  Last week patient had two lesions on the bilateral LEs,  will have staff remove Unna's boots and verify with MD to replace.                                            Discharge diagnosis     Principal Problem:   Acute metabolic encephalopathy Active Problems:   Orthostatic hypotension   Colostomy status (HCC) -  Care Everywhere records  reviewed. States pt had total colectomy performed in June 2011 in [place] .   Nephrostomy present [facility]) - left kidney. Care Everywhere records reviewed. appears to bave been ordered by Dr. Dasie Clark in [place] . placed 02-2012   End-stage renal disease on hemodialysis (HCC)   Ulcerative colitis (HCC) - S/p total colectomy. 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   Paroxysmal atrial fibrillation (HCC)   Adrenal insufficiency   Presacral Abscess   Bilateral leg edema   Pressure injury of skin of multiple topographic sites    Discharge instructions    Discharge Instructions     Call MD for:  difficulty breathing, headache or visual disturbances   Complete by: As directed    Call MD for:  extreme fatigue   Complete by: As directed    Call MD for:  hives   Complete by: As directed    Call MD for:  persistant dizziness or light-headedness   Complete by: As directed    Call MD for:  persistant nausea and vomiting   Complete by: As directed    Call MD for:  redness, tenderness, or signs of infection (pain, swelling, redness, odor or green/yellow discharge around incision site)   Complete by: As directed    Call MD for:  severe uncontrolled pain   Complete by: As directed    Call MD for:  temperature >100.4   Complete by: As directed    Diet renal with fluid restriction   Complete by: As directed    50 ounces per day fluid restriction   Discharge instructions   Complete by: As directed    1. Follow up with your primary care provider in 1-2 weeks following discharge from  hospital. 2. Follow up routine dialysis on Monday, Wednesday, Friday.   Discharge wound care:   Complete by: As directed    04/19/24 0500    Wound care  Daily      Comments: Black wound noted right medial 2nd toe; with history of PAD; continue to paint with betadine daily. Allow to air dry  Wound care  Every other day    Comments: 1.Linear wounds on the left 2nd and 3rd toe; clean and dry; ok to apply xeroform every other day 2.         Large akin tear right arm, cleanse with saline, pat dry. Apply single layer of xeroform and change every other day and dry topper dressing.  4.         Skin tear left elbow, skin flap present; attempt to re-aproximate skin flap and steristrip, apply single layer of xeroform gauze and dry dressing  5.         Skin tear left bicep, cleanse with saline, pat dry. Cover with single layer of xeroform and top with dry dressing or foam. Change every other day.  6.Skin tear left outer bicep; cleanse with saline, pat dry. Apply single layer of xeroform and top with dry dressing. Change every other day.   Discharge wound care:   Complete by: As directed    See above   Increase activity slowly   Complete by: As directed        Discharge Medications   Allergies as of 04/22/2024       Reactions   Baclofen Other (See Comments)   Altered mental status, after accidental overdose   Cephalosporins Rash   Ciprofloxacin Itching,  Rash   Wound Dressing Adhesive Rash        Medication List     TAKE these medications    atorvastatin  20 MG tablet Commonly known as: LIPITOR Take 1 tablet (20 mg total) by mouth daily.   calcitRIOL  0.5 MCG capsule Commonly known as: ROCALTROL  Take 1 capsule (0.5 mcg total) by mouth every Monday, Wednesday, and Friday with hemodialysis.   Chlorhexidine  Gluconate Cloth 2 % Pads Apply 6 each topically daily at 6 (six) AM. Start taking on: April 23, 2024   dorzolamide  2 % ophthalmic solution Commonly known as: TRUSOPT  Place 1  drop into the right eye at bedtime.   Eliquis  2.5 MG Tabs tablet Generic drug: apixaban  Take 1 tablet (2.5 mg total) by mouth 2 (two) times daily.   ertapenem  1 g in sodium chloride  0.9 % 100 mL Inject 1 g into the vein every Monday, Wednesday, and Friday at 6 PM.   fludrocortisone  0.1 MG tablet Commonly known as: FLORINEF  Take 1 tablet (0.1 mg total) by mouth daily.   folic acid  1 MG tablet Commonly known as: FOLVITE  Take 1 tablet (1 mg total) by mouth daily.   gabapentin  300 MG capsule Commonly known as: NEURONTIN  TAKE 1 CAPSULE BY MOUTH ONCE A DAY AT BEDTIME FOR NEUROPATHY   hydrocortisone  10 MG tablet Commonly known as: CORTEF  Take 1 tablet (10 mg total) by mouth every evening.   hydrocortisone  20 MG tablet Commonly known as: CORTEF  Take 1 tablet (20 mg total) by mouth daily.   midodrine  10 MG tablet Commonly known as: PROAMATINE  Take 3 tablets (30 mg total) by mouth 3 (three) times daily.   mometasone -formoterol  200-5 MCG/ACT Aero Commonly known as: DULERA  Inhale 2 puffs into the lungs 2 (two) times daily.   multivitamin Tabs tablet Take 1 tablet by mouth daily.   Omega-3 Gummies Chew Chew 1,200 mg by mouth in the morning and at bedtime.   pantoprazole  40 MG tablet Commonly known as: PROTONIX  Take 1 tablet (40 mg total) by mouth 2 (two) times daily. What changed:  when to take this reasons to take this   ramelteon  8 MG tablet Commonly known as: ROZEREM  Take 1 tablet (8 mg total) by mouth at bedtime.   Saline Flush 0.9 % Soln Use 5 mLs by Intracatheter route daily as directed.   timolol  0.5 % ophthalmic solution Commonly known as: TIMOPTIC  Place 1 drop into both eyes in the morning and at bedtime.   traMADol  50 MG tablet Commonly known as: ULTRAM  Take 1 tablet (50 mg total) by mouth every 12 (twelve) hours as needed (for pain).   Tylenol  325 MG tablet Generic drug: acetaminophen  Take 325-650 mg by mouth every 6 (six) hours as needed for mild pain  (pain score 1-3) or headache.   VITAMIN B-12 PO Take 1 capsule by mouth in the morning and at bedtime.               Discharge Care Instructions  (From admission, onward)           Start     Ordered   04/22/24 0000  Discharge wound care:       Comments: 04/19/24 0500    Wound care  Daily      Comments: Black wound noted right medial 2nd toe; with history of PAD; continue to paint with betadine daily. Allow to air dry  Wound care  Every other day    Comments: 1.Linear wounds on the left 2nd and 3rd toe;  clean and dry; ok to apply xeroform every other day 2.         Large akin tear right arm, cleanse with saline, pat dry. Apply single layer of xeroform and change every other day and dry topper dressing.  4.         Skin tear left elbow, skin flap present; attempt to re-aproximate skin flap and steristrip, apply single layer of xeroform gauze and dry dressing  5.         Skin tear left bicep, cleanse with saline, pat dry. Cover with single layer of xeroform and top with dry dressing or foam. Change every other day.  6.Skin tear left outer bicep; cleanse with saline, pat dry. Apply single layer of xeroform and top with dry dressing. Change every other day.   04/22/24 1123   04/22/24 0000  Discharge wound care:       Comments: See above   04/22/24 1123             Contact information for follow-up providers     [place], [facility]. Go on 04/25/2024.   Why: Please arrive for dialysis 10/20 at 10:45AM for paperwork.  (After first appointment, you will go here every Monday, Wednesday, and Friday at 11:45). Contact information: [address] [place] [address] [phone]              Contact information for after-discharge care     Destination     [facility] .   Service: Skilled Nursing Contact information: [address] [facility] [place]  [address] [phone]                     Major procedures and  Radiology Reports - PLEASE review detailed and final reports thoroughly  -       DG CHEST PORT 1 VIEW Result Date: 04/19/2024 EXAM: 1 VIEW(S) XRAY OF THE CHEST 04/19/2024 01:01:33 AM COMPARISON: Portable chest x-ray 04/11/2024. CLINICAL HISTORY: 201257 ESRD (end stage renal disease) (HCC) [id number]. Shob/ low Oxygen levels. FINDINGS: LINES, TUBES AND DEVICES: A right subclavian venous stent is again shown. LUNGS AND PLEURA: No focal pulmonary opacity. Slight interstitial edema in the lung bases, findings improved. There is a small right pleural effusion. Linear atelectasis in both lung bases. No focal pneumonia is seen. No pneumothorax. HEART AND MEDIASTINUM: There is mild cardiomegaly. The mediastinum is stable with mild aortic tortuosity and atherosclerosis. There is mild prominence in the central vasculature, improved. BONES AND SOFT TISSUES: There is osteopenia with thoracic spondylosis. No acute osseous abnormality. IMPRESSION: 1. Small right pleural effusion, not significantly changed . 2. Mild pulmonary interstitial edema with mild central vascular congestion, improved. 3. No focal pneumonia is evident . Bilateral basal linear atelectasis. Electronically signed by: Francis Quam MD 04/19/2024 01:13 AM EDT RP Workstation: [id number]   CT Head Wo Contrast Result Date: 04/18/2024 EXAM: CT HEAD WITHOUT CONTRAST 04/18/2024 06:34:38 AM TECHNIQUE: CT of the head was performed without the administration of intravenous contrast. Automated exposure control, iterative reconstruction, and/or weight based adjustment of the mA/kV was utilized to reduce the radiation dose to as low as reasonably achievable. COMPARISON: Brain MRI 11/29/2023, head CT 11/28/2023. CLINICAL HISTORY: 88 year old male with mental status change, unknown cause (AMS). FINDINGS: BRAIN AND VENTRICLES: No acute hemorrhage. No evidence of acute infarct. No hydrocephalus. No extra-axial collection. No mass effect or midline shift. Cerebral volume  is stable and within normal limits for age. Very mild for age chronic  periventricular white matter hypodensity is stable. Calcified atherosclerosis at the skull base. No suspicious intracranial vascular hyperdensity. ORBITS: Postoperative changes to both globes. SINUSES: Mild paranasal sinus mucosal thickening. SOFT TISSUES AND SKULL: Chronic calcified scalp vessel atherosclerosis. No acute soft tissue abnormality. No skull fracture. IMPRESSION: 1. No acute intracranial abnormality. 2. Negative for age non-contrast CT appearance of the brain. Electronically signed by: Helayne Hurst MD 04/18/2024 06:47 AM EDT RP Workstation: [id number]   US  Venous Img Lower Bilateral Result Date: 04/12/2024 CLINICAL DATA:  Bilateral leg swelling EXAM: BILATERAL LOWER EXTREMITY VENOUS DOPPLER ULTRASOUND TECHNIQUE: Gray-scale sonography with graded compression, as well as color Doppler and duplex ultrasound were performed to evaluate the lower extremity deep venous systems from the level of the common femoral vein and including the common femoral, femoral, profunda femoral, popliteal and calf veins including the posterior tibial, peroneal and gastrocnemius veins when visible. The superficial great saphenous vein was also interrogated. Spectral Doppler was utilized to evaluate flow at rest and with distal augmentation maneuvers in the common femoral, femoral and popliteal veins. COMPARISON:  None Available. FINDINGS: RIGHT LOWER EXTREMITY Common Femoral Vein: No evidence of thrombus. Normal compressibility, respiratory phasicity and response to augmentation. Saphenofemoral Junction: No evidence of thrombus. Normal compressibility and flow on color Doppler imaging. Profunda Femoral Vein: No evidence of thrombus. Normal compressibility and flow on color Doppler imaging. Femoral Vein: No evidence of thrombus. Normal compressibility, respiratory phasicity and response to augmentation. Popliteal Vein: No evidence of thrombus. Normal  compressibility, respiratory phasicity and response to augmentation. Calf Veins: No evidence of thrombus. Normal compressibility and flow on color Doppler imaging. Superficial Great Saphenous Vein: No evidence of thrombus. Normal compressibility and flow on color Doppler imaging. Venous Reflux:  None. Other Findings:  Mild edema is noted. LEFT LOWER EXTREMITY Common Femoral Vein: No evidence of thrombus. Normal compressibility, respiratory phasicity and response to augmentation. Saphenofemoral Junction: No evidence of thrombus. Normal compressibility and flow on color Doppler imaging. Profunda Femoral Vein: No evidence of thrombus. Normal compressibility and flow on color Doppler imaging. Femoral Vein: No evidence of thrombus. Normal compressibility, respiratory phasicity and response to augmentation. Popliteal Vein: No evidence of thrombus. Normal compressibility, respiratory phasicity and response to augmentation. Calf Veins: No evidence of thrombus. Normal compressibility and flow on color Doppler imaging. Superficial Great Saphenous Vein: No evidence of thrombus. Normal compressibility and flow on color Doppler imaging. Venous Reflux:  None. Other Findings:  Mild edema is noted. IMPRESSION: No evidence of deep venous thrombosis. Electronically Signed   By: Oneil Devonshire M.D.   On: 04/12/2024 00:55   CT ABDOMEN PELVIS WO CONTRAST Result Date: 04/11/2024 CLINICAL DATA:  Recent fall and findings suspicious for sepsis EXAM: CT ABDOMEN AND PELVIS WITHOUT CONTRAST TECHNIQUE: Multidetector CT imaging of the abdomen and pelvis was performed following the standard protocol without IV contrast. RADIATION DOSE REDUCTION: This exam was performed according to the departmental dose-optimization program which includes automated exposure control, adjustment of the mA and/or kV according to patient size and/or use of iterative reconstruction technique. COMPARISON:  04/04/2024 FINDINGS: Lower chest: Large right-sided pleural  effusion is again noted. Vascular congestion is again seen. Hepatobiliary: Cholelithiasis without complicating factors. The liver is within normal limits. Pancreas: Unremarkable. No pancreatic ductal dilatation or surrounding inflammatory changes. Spleen: Normal in size without focal abnormality. Adrenals/Urinary Tract: Adrenal glands are within normal limits. Kidneys are atrophic consistent with the known end-stage renal disease. Scattered cysts are noted bilaterally. No follow-up is recommended. Nonobstructing stone is seen in the  lower pole of the right kidney. Left-sided nephrostomy catheter is noted and stable. No ureteral stones are seen. The bladder is decompressed. Stomach/Bowel: There are changes consistent with right-sided ileostomy and prior total colectomy. These are stable in appearance. No small bowel obstructive changes are seen. The stomach is within normal limits. Drainage catheter is seen in the presacral region. Vascular/Lymphatic: Aortic atherosclerosis. No enlarged abdominal or pelvic lymph nodes. Left AV graft is noted. Reproductive: Prostate is unremarkable.  Penile prosthesis is noted. Other: No abdominal wall hernia or abnormality. No abdominopelvic ascites. Musculoskeletal: L5 pars defects are noted with grade 1 anterolisthesis. IMPRESSION: Stable right-sided pleural effusion. Cholelithiasis without complicating factors. Stable right renal calculi.  Stable renal atrophy. Electronically Signed   By: Oneil Devonshire M.D.   On: 04/11/2024 23:08   DG Chest 2 View Result Date: 04/11/2024 CLINICAL DATA:  Follow-up pleural effusion EXAM: CHEST - 2 VIEW COMPARISON:  03/14/2024 FINDINGS: Cardiac shadow is stable. Mild vascular congestion is again noted. Small right pleural effusion is seen similar to that noted on the prior exam. No new focal infiltrate is seen. Right subclavian vein stent is noted. IMPRESSION: Mild vascular congestion with stable small right pleural effusion. Electronically Signed    By: Oneil Devonshire M.D.   On: 04/11/2024 21:36   DG Chest Port 1 View Result Date: 04/11/2024 CLINICAL DATA:  Possible sepsis EXAM: PORTABLE CHEST 1 VIEW COMPARISON:  04/07/2024 FINDINGS: Cardiac shadow is prominent but accentuated by the portable technique. Thoracic aorta is tortuous. Right subclavian vein stent is noted. Mild central vascular congestion is noted with interstitial edema slightly improved when compared with the prior exam. Small right-sided pleural effusion is noted. Basilar atelectasis is seen as well on the right. IMPRESSION: Mild congestive failure. Right basilar atelectasis and associated effusion. Electronically Signed   By: Oneil Devonshire M.D.   On: 04/11/2024 21:35   DG Sinus/Fist Tube Chk-Non GI Result Date: 04/04/2024 CLINICAL DATA:  Patient with a history of chronic presacral abscess with most recent drain placement in IR 03/16/2024. EXAM: ABSCESS INJECTION COMPARISON:  None Available. CONTRAST:  7 ml Isovue  300 - administered via the existing percutaneous drain. FLUOROSCOPY TIME:  Radiation exposure index as provided by the fluoroscopic device: 4.8 mGy kerma TECHNIQUE: The patient was positioned prone on the fluoroscopy table. A pre-procedural spot fluoroscopic image was obtained of the presacral/pelvic abscess and the existing percutaneous drainage catheter. Multiple spot fluoroscopic and radiographic images were obtained following the injection of a small amount of contrast via the existing percutaneous drainage catheter. FINDINGS: Contrast injection showed a possible fistula to the bowel. IMPRESSION: Possible fistula to the bowel and the drain will be left in place. The patient will return to the clinic in approximately 3 weeks for repeat CT scan and drain injection. Electronically Signed   By: Ester Sides M.D.   On: 04/04/2024 15:23   CT ABDOMEN PELVIS W CONTRAST Result Date: 04/04/2024 CLINICAL DATA:  88 year old male with history of recurrent presacral abscess status post  drain placement on 03/16/2024. EXAM: CT ABDOMEN AND PELVIS WITH CONTRAST TECHNIQUE: Multidetector CT imaging of the abdomen and pelvis was performed using the standard protocol following bolus administration of intravenous contrast. RADIATION DOSE REDUCTION: This exam was performed according to the departmental dose-optimization program which includes automated exposure control, adjustment of the mA and/or kV according to patient size and/or use of iterative reconstruction technique. CONTRAST:  100mL ISOVUE -300 IOPAMIDOL  (ISOVUE -300) INJECTION 61% COMPARISON:  03/16/2024, 03/14/2024 FINDINGS: Lower chest: Similar appearing small right pleural  effusion with associated right basilar passive atelectasis. The heart is normal in size without significant pericardial effusion. Hepatobiliary: No focal liver abnormality is seen. Unchanged cholelithiasis without inflammatory changes. No biliary dilatation. Pancreas: Unremarkable. No pancreatic ductal dilatation or surrounding inflammatory changes. Spleen: Normal in size without focal abnormality. Adrenals/Urinary Tract: Adrenal glands are unremarkable. Well-positioned in unchanged appearance of indwelling left percutaneous nephrostomy tube. Kidneys are chronically atrophic, without renal calculi, focal lesion, or hydronephrosis. Bladder is unremarkable. Stomach/Bowel: Stomach is within normal limits. 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 No evidence of bowel wall thickening, distention, or inflammatory changes. Vascular/Lymphatic: Aortic atherosclerosis. Partially visualized left femoral arteriovenous graft which appears patent. No enlarged abdominal or pelvic lymph nodes. Reproductive: Prostate is unremarkable. Penile prosthetic pump partially visualized. Other: Unchanged position of indwelling left transgluteal pigtail drainage catheter with trace residual gas containing fluid collection just superior to the  pigtail portion of the catheter. No new fluid collections or ascites. Musculoskeletal: No acute osseous abnormality. Unchanged bilateral L5 pars defects with unchanged mild anterolisthesis of L5 on S1. IMPRESSION: 1. Unchanged position of indwelling left transgluteal pigtail drainage catheter with trace residual gas containing fluid collection just superior to the pigtail portion of the catheter. No new fluid collections or ascites. 2. Well-positioned and unchanged appearance of indwelling left percutaneous nephrostomy tube. 3. Similar appearing small right pleural effusion with associated right basilar passive atelectasis. 4. Cholelithiasis without evidence of cholecystitis. 5.  Aortic Atherosclerosis (ICD10-I70.0). Ester Sides, MD [facility] Radiology Electronically Signed   By: Ester Sides M.D.   On: 04/04/2024 14:24   IR Radiologist Eval & Mgmt Result Date: 04/04/2024 EXAM: See EPIC note CHIEF COMPLAINT: See EPIC note HISTORY OF PRESENT ILLNESS: See EPIC note REVIEW OF SYSTEMS: See EPIC note PHYSICAL EXAMINATION: See EPIC note ASSESSMENT AND PLAN: See EPIC note Electronically Signed   By: Ester Sides M.D.   On: 04/04/2024 13:57    Micro Results    Recent Results (from the past 240 hours)  Blood Culture (routine x 2)     Status: None   Collection Time: 04/12/24  8:30 PM   Specimen: BLOOD RIGHT ARM  Result Value Ref Range Status   Specimen Description BLOOD RIGHT ARM  Final   Special Requests   Final    BOTTLES DRAWN AEROBIC AND ANAEROBIC Blood Culture adequate volume   Culture   Final    NO GROWTH 5 DAYS Performed at [facility], [address] [place], [place] [address]    Report Status 04/17/2024 FINAL  Final  Culture, blood (routine x 2)     Status: None (Preliminary result)   Collection Time: 04/18/24  6:01 AM   Specimen: BLOOD  Result Value Ref Range Status   Specimen Description BLOOD SITE NOT SPECIFIED  Final    Special Requests   Final    BOTTLES DRAWN AEROBIC AND ANAEROBIC Blood Culture results may not be optimal due to an inadequate volume of blood received in culture bottles   Culture   Final    NO GROWTH 4 DAYS Performed at [facility], [address], [place], [place] [address]    Report Status PENDING  Incomplete  Resp panel by RT-PCR (RSV, Flu A&B, Covid) Anterior Nasal Swab     Status: None   Collection Time: 04/18/24  6:01 AM   Specimen: Anterior Nasal Swab  Result Value Ref Range Status   SARS Coronavirus 2  by RT PCR NEGATIVE NEGATIVE Final   Influenza A by PCR NEGATIVE NEGATIVE Final   Influenza B by PCR NEGATIVE NEGATIVE Final    Comment: (NOTE) The Xpert Xpress SARS-CoV-2/FLU/RSV plus assay is intended as an aid in the diagnosis of influenza from Nasopharyngeal swab specimens and should not be used as a sole basis for treatment. Nasal washings and aspirates are unacceptable for Xpert Xpress SARS-CoV-2/FLU/RSV testing.  Fact Sheet for Patients: BloggerCourse.com  Fact Sheet for Healthcare Providers: SeriousBroker.it  This test is not yet approved or cleared by the [place]  FDA and has been authorized for detection and/or diagnosis of SARS-CoV-2 by FDA under an Emergency Use Authorization (EUA). This EUA will remain in effect (meaning this test can be used) for the duration of the COVID-19 declaration under Section 564(b)(1) of the Act, 21 U.S.C. section 360bbb-3(b)(1), unless the authorization is terminated or revoked.     Resp Syncytial Virus by PCR NEGATIVE NEGATIVE Final    Comment: (NOTE) Fact Sheet for Patients: BloggerCourse.com  Fact Sheet for Healthcare Providers: SeriousBroker.it  This test is not yet approved or cleared by the [place]  FDA and has been authorized for detection and/or diagnosis of SARS-CoV-2 by FDA under an Emergency Use  Authorization (EUA). This EUA will remain in effect (meaning this test can be used) for the duration of the COVID-19 declaration under Section 564(b)(1) of the Act, 21 U.S.C. section 360bbb-3(b)(1), unless the authorization is terminated or revoked.  Performed at [facility], [address] [place], [place] [address]   Culture, blood (routine x 2)     Status: None (Preliminary result)   Collection Time: 04/18/24  6:06 AM   Specimen: BLOOD  Result Value Ref Range Status   Specimen Description BLOOD SITE NOT SPECIFIED  Final   Special Requests   Final    BOTTLES DRAWN AEROBIC AND ANAEROBIC Blood Culture adequate volume   Culture   Final    NO GROWTH 4 DAYS Performed at [facility], [address] [place], [place] [address]    Report Status PENDING  Incomplete    Today   Subjective    Joshua Riley feels much improved since admission.  Feels ready to be discharged, agreeable to go to SNF.   Objective   Blood pressure (!) 82/64, pulse 73, temperature (!) 97.4 F (36.3 C), temperature source Oral, resp. rate 16, height 5' 8 (1.727 m), weight 67.4 kg, SpO2 95%.   Intake/Output Summary (Last 24 hours) at 04/22/2024 1132 Last data filed at 04/22/2024 0900 Gross per 24 hour  Intake 532.08 ml  Output 150 ml  Net 382.08 ml   Physical Exam Vitals and nursing note reviewed.  Constitutional:      General: He is not in acute distress.    Appearance: He is not toxic-appearing.     Comments: Appears chronically ill  HENT:     Head: Normocephalic and atraumatic.  Cardiovascular:     Rate and Rhythm: Normal rate and regular rhythm.  Pulmonary:     Effort: Pulmonary effort is normal.     Breath sounds: Normal breath sounds.  Abdominal:     General: Abdomen is flat. Bowel sounds are normal.  Skin:    General: Skin is warm.     Capillary Refill: Capillary refill takes less than 2 seconds.     Comments: See pictures of wounds in media tab. Multiple wounds of  arms/legs  Neurological:     Mental Status: He is alert  and oriented to person, place, and time.      Data Review   CBC w Diff:  Lab Results  Component Value Date   WBC 9.6 04/20/2024   HGB 10.3 (L) 04/20/2024   HGB 10.9 (L) 04/10/2023   HCT 31.4 (L) 04/20/2024   HCT 33.2 (L) 04/10/2023   PLT 150 04/20/2024   PLT 207 04/10/2023   LYMPHOPCT 8 04/20/2024   MONOPCT 8 04/20/2024   EOSPCT 1 04/20/2024   BASOPCT 0 04/20/2024    CMP:  Lab Results  Component Value Date   NA 135 04/20/2024   NA 138 04/10/2023   K 4.7 04/20/2024   CL 96 (L) 04/20/2024   CO2 22 04/20/2024   BUN 82 (H) 04/20/2024   BUN 19 04/10/2023   CREATININE 8.05 (H) 04/20/2024   PROT 6.7 04/18/2024   ALBUMIN  3.3 (L) 04/20/2024   BILITOT 1.2 04/18/2024   ALKPHOS 112 04/18/2024   AST 25 04/18/2024   ALT 17 04/18/2024  .   Total Time in preparing paper work, data evaluation and todays exam - 35 minutes  Ivan Vangie Pike M.D on 04/22/2024 at 11:32 AM  [facility]

## 2024-04-22 NOTE — Progress Notes (Signed)
 Patient up in chair.Attempted to get patient back into bed and patienr refused stating, I sleep in the chair  Patient educated on importance of getting back in bed to prevent damage to skin.  Patient acknowledged instructions but continued to refuse to go back to the bed.

## 2024-04-22 NOTE — Plan of Care (Signed)
  Problem: Education: Goal: Knowledge of General Education information will improve Description: Including pain rating scale, medication(s)/side effects and non-pharmacologic comfort measures Outcome: Progressing   Problem: Health Behavior/Discharge Planning: Goal: Ability to manage health-related needs will improve Outcome: Progressing   Problem: Clinical Measurements: Goal: Ability to maintain clinical measurements within normal limits will improve Outcome: Progressing Goal: Will remain free from infection Outcome: Progressing Goal: Diagnostic test results will improve Outcome: Progressing Goal: Respiratory complications will improve Outcome: Progressing Goal: Cardiovascular complication will be avoided Outcome: Progressing   Problem: Activity: Goal: Risk for activity intolerance will decrease Outcome: Progressing   Problem: Nutrition: Goal: Adequate nutrition will be maintained Outcome: Progressing   Problem: Elimination: Goal: Will not experience complications related to bowel motility Outcome: Progressing   Problem: Coping: Goal: Level of anxiety will decrease Outcome: Progressing   Problem: Pain Managment: Goal: General experience of comfort will improve and/or be controlled Outcome: Progressing

## 2024-04-22 NOTE — Progress Notes (Signed)
 Patient has order for Joshua Riley to be changed on Monday and Thursday.  Patient declined to have this changed as scheduled at 2am and patient wants to sleep.  Patient educated on need to follow orders for best outcome. Understanding verbalized by patient.  Patient is forgetful at times and needs redirection.

## 2024-04-22 NOTE — Progress Notes (Signed)
 Mobility Specialist Progress Note:   04/22/24 1103  Mobility  Activity Ambulated with assistance (In hallway)  Level of Assistance Contact guard assist, steadying assist  Assistive Device Front wheel walker  Distance Ambulated (ft) 60 ft  Activity Response Tolerated well  Mobility Referral Yes  Mobility visit 1 Mobility  Mobility Specialist Start Time (ACUTE ONLY) 0901  Mobility Specialist Stop Time (ACUTE ONLY) [id number]  Mobility Specialist Time Calculation (min) (ACUTE ONLY) 20 min   Received pt in chair and agreeable to mobility. Pt required MinG for safety. C/o back pain, otherwise tolerated well. Returned to room without fault. Left pt in chair. Personal belongings and call light within reach. All needs met.  Lavanda Pollack Mobility Specialist  Please contact via [employer] or  Rehab Office [phone]

## 2024-04-22 NOTE — TOC Progression Note (Addendum)
 Transition of Care [facility]) - Progression Note    Patient Details  Name: Joshua Riley MRN: [id number] Date of Birth: 05/09/1936  Transition of Care [facility]) CM/SW Contact  Bridget Cordella Simmonds, LCSW Phone Number: 04/22/2024, 10:32 AM  Clinical Narrative:   CSW informed HD issue has been resolved, pt outpt HD in place starting Mon, 10/20.  CSW confirmed with [facility] that they can receive pt today. Pt without 3 midnight stay this admission, is eligible for SNF waiver, also had 3 midnight stay with 04/11/24 admission.    MD informed.  SNF requesting 1 week iliostomy supplies.  RN informed.   1130: CSW informed by SNF that passr still pending.  PASSR docs uploaded in [place] must, PASSR pending at this time.   1200: CSW spoke with pt daughter [name], updated on PASSR number.  Discussed transportation: she can transport to SNF after work today.  1300: PASSR received: [phone] [id number]  CSW confirmed that pt will receive IV abx at HD, not SNR.  Renal aware.  SNF needs pt on site by 1630 if coming by private vehicle.  CSW spoke with [name] again, she cannot transport by then and is agreeable to [facility] transport, aware that she could be billed.    1515: DC cancelled.  SNF and daughter [name] informed.   Expected Discharge Plan: Skilled Nursing Facility Barriers to Discharge: Continued Medical Work up               Expected Discharge Plan and Services In-house Referral: Clinical Social Work Discharge Planning Services: CM Consult Post Acute Care Choice: Skilled Nursing Facility Living arrangements for the past 2 months: Apartment Expected Discharge Date: 04/19/24                                     Social Drivers of Health (SDOH) Interventions SDOH Screenings   Food Insecurity: No Food Insecurity (04/18/2024)  Housing: Low Risk  (04/18/2024)  Transportation Needs: No Transportation Needs (04/18/2024)  Utilities: Not At Risk (04/18/2024)  Alcohol Screen: Low Risk  (10/26/2023)   Depression (PHQ2-9): Medium Risk (04/07/2024)  Financial Resource Strain: Low Risk  (10/26/2023)  Physical Activity: Insufficiently Active (10/26/2023)  Social Connections: Patient Declined (04/18/2024)  Recent Concern: Social Connections - Socially Isolated (01/27/2024)  Stress: Stress Concern Present (10/26/2023)  Tobacco Use: Medium Risk (04/18/2024)    Readmission Risk Interventions    12/03/2023   11:30 AM 11/10/2023    3:46 PM 07/21/2023   12:26 PM  Readmission Risk Prevention Plan  Transportation Screening Complete Complete Complete  PCP or Specialist Appt within 5-7 Days  Complete   Home Care Screening  Complete   Medication Review (RN CM)  Complete   HRI or Home Care Consult   Complete  Social Work Consult for Recovery Care Planning/Counseling   Complete  Palliative Care Screening   Not Applicable  Medication Review Oceanographer)   Complete  SW Recovery Care/Counseling Consult Complete    Palliative Care Screening Complete    Skilled Nursing Facility Complete

## 2024-04-22 NOTE — Plan of Care (Signed)
  Problem: Health Behavior/Discharge Planning: Goal: Ability to manage health-related needs will improve Outcome: Progressing   Problem: Clinical Measurements: Goal: Ability to maintain clinical measurements within normal limits will improve Outcome: Progressing   Problem: Clinical Measurements: Goal: Will remain free from infection Outcome: Progressing   Problem: Clinical Measurements: Goal: Diagnostic test results will improve Outcome: Progressing   Problem: Activity: Goal: Risk for activity intolerance will decrease Outcome: Not Progressing   Problem: Nutrition: Goal: Adequate nutrition will be maintained Outcome: Progressing   Problem: Coping: Goal: Level of anxiety will decrease Outcome: Progressing   Problem: Safety: Goal: Ability to remain free from injury will improve Outcome: Progressing   Problem: Skin Integrity: Goal: Risk for impaired skin integrity will decrease Outcome: Progressing

## 2024-04-23 DIAGNOSIS — G9340 Encephalopathy, unspecified: Secondary | ICD-10-CM | POA: Diagnosis not present

## 2024-04-23 DIAGNOSIS — G9341 Metabolic encephalopathy: Secondary | ICD-10-CM | POA: Diagnosis not present

## 2024-04-23 LAB — CBC
HCT: 28.3 % — ABNORMAL LOW (ref 39.0–52.0)
Hemoglobin: 9.1 g/dL — ABNORMAL LOW (ref 13.0–17.0)
MCH: 36.8 pg — ABNORMAL HIGH (ref 26.0–34.0)
MCHC: 32.2 g/dL (ref 30.0–36.0)
MCV: 114.6 fL — ABNORMAL HIGH (ref 80.0–100.0)
Platelets: 177 K/uL (ref 150–400)
RBC: 2.47 MIL/uL — ABNORMAL LOW (ref 4.22–5.81)
RDW: 17.6 % — ABNORMAL HIGH (ref 11.5–15.5)
WBC: 10.6 K/uL — ABNORMAL HIGH (ref 4.0–10.5)
nRBC: 0 % (ref 0.0–0.2)

## 2024-04-23 LAB — RENAL FUNCTION PANEL
Albumin: 3 g/dL — ABNORMAL LOW (ref 3.5–5.0)
Anion gap: 17 — ABNORMAL HIGH (ref 5–15)
BUN: 95 mg/dL — ABNORMAL HIGH (ref 8–23)
CO2: 18 mmol/L — ABNORMAL LOW (ref 22–32)
Calcium: 8.8 mg/dL — ABNORMAL LOW (ref 8.9–10.3)
Chloride: 97 mmol/L — ABNORMAL LOW (ref 98–111)
Creatinine, Ser: 7.77 mg/dL — ABNORMAL HIGH (ref 0.61–1.24)
GFR, Estimated: 6 mL/min — ABNORMAL LOW (ref >60)
Glucose, Bld: 109 mg/dL — ABNORMAL HIGH (ref 70–99)
Phosphorus: 5 mg/dL — ABNORMAL HIGH (ref 2.5–4.6)
Potassium: 4.9 mmol/L (ref 3.5–5.1)
Sodium: 132 mmol/L — ABNORMAL LOW (ref 135–145)

## 2024-04-23 LAB — CULTURE, BLOOD (ROUTINE X 2)
Culture: NO GROWTH
Culture: NO GROWTH
Special Requests: ADEQUATE

## 2024-04-23 MED ORDER — SODIUM CHLORIDE 0.9 % IV SOLN
1.0000 g | Freq: Once | INTRAVENOUS | Status: AC
  Administered 2024-04-23: 1 g via INTRAVENOUS
  Filled 2024-04-23: qty 1000

## 2024-04-23 NOTE — Progress Notes (Signed)
 PROGRESS NOTE    Joshua Riley  FMW:[id number]  DOB: Nov 12, 1935  DOA: 04/18/2024 PCP: Katrinka Garnette KIDD, MD Outpatient Specialists:   Hospital course:  88 y.o. 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   Patient was supposed to be discharged to SNF bed yesterday however at discharge was discontinued when the hemodialysis center in [place] did not have access to ertapenem .  Patient will be discharged once ertapenem  is procured.    Subjective:  Patient states he feels good.  Patient and daughter are both frustrated that he was not able to be discharged yesterday.  Patient's daughter is also upset that patient seems to improve while he is in the hospital but then when he goes home he does not do well again because there is not adequate support.  Patient's daughter is wondering what other support they might be able to get.   Objective: Vitals:   04/23/24 1030 04/23/24 1054 04/23/24 1100 04/23/24 1612  BP: 98/67 (!) 95/58 (!) 102/58 (!) 83/44  Pulse: 79  66 85  Resp: (!) 22 (!) 23 16 16   Temp:   97.6 F (36.4 C) (!) 97.5 F (36.4 C)  TempSrc:    Oral  SpO2: 100% (!) 71% 99% 100%  Weight:   70.2 kg   Height:        Intake/Output Summary (Last 24 hours) at 04/23/2024 2023 Last data  filed at 04/23/2024 1837 Gross per 24 hour  Intake 540 ml  Output 1600 ml  Net -1060 ml   Filed Weights   04/18/24 1553 04/23/24 0749 04/23/24 1100  Weight: 67.4 kg 70.2 kg 70.2 kg     Exam:  General: Patient looks well today sitting up in chair chatting with daughter Eyes: sclera anicteric, conjuctiva mild injection bilaterally CVS: S1-S2, regular  Respiratory:  decreased air entry bilaterally secondary to decreased inspiratory effort, rales at bases  GI: NABS, soft, NT, right-sided colostomy in place LE: Superficial ecchymoses  Psych: patient is logical and coherent, judgement and insight appear normal, mood and affect appropriate to situation.  Data Reviewed:  Basic Metabolic Panel: Recent Labs  Lab 04/18/24 0558 04/18/24 0725 04/19/24 0413 04/20/24 0807 04/23/24 0800  NA 138  --  134* 135 132*  K 5.6*  --  5.5* 4.7 4.9  CL 100  --  95* 96* 97*  CO2 20*  --  22 22 18*  GLUCOSE 115*  --  133* 127* 109*  BUN 89*  --  48* 82* 95*  CREATININE 8.71* 8.76* 5.73* 8.05* 7.77*  CALCIUM  9.8  --  8.8* 9.1 8.8*  PHOS  --   --   --  5.4* 5.0*    CBC: Recent Labs  Lab 04/18/24 0558 04/18/24 0725 04/20/24 0807 04/23/24 0814  WBC 9.3 9.2 9.6 10.6*  NEUTROABS 7.9*  --  7.9*  --   HGB 10.5* 10.0* 10.3* 9.1*  HCT 32.9* 31.6* 31.4* 28.3*  MCV 116.3* 116.6*  115.0* 114.6*  PLT 123* 120* 150 177     Scheduled Meds:  apixaban   2.5 mg Oral BID   atorvastatin   20 mg Oral Daily   calcitRIOL   0.5 mcg Oral Q M,W,F-HD   Chlorhexidine  Gluconate Cloth  6 each Topical Q0600   Chlorhexidine  Gluconate Cloth  6 each Topical Q0600   dorzolamide   1 drop Right Eye QHS   fludrocortisone   0.1 mg Oral Daily   fluticasone  furoate-vilanterol  1 puff Inhalation Daily   hydrocortisone   10 mg Oral Q24H   hydrocortisone   20 mg Oral Q24H   liver oil-zinc oxide   Topical BID   midodrine   30 mg Oral TID   pantoprazole   40 mg Oral BID   ramelteon   8 mg Oral QHS   timolol   1 drop Both Eyes  BID   Continuous Infusions:  ertapenem        Assessment & Plan:   Disposition Patient was supposed be discharged yesterday however hemodialysis place in [place] did not have a ertapenem  so patient was not able to go.  Patient can be discharged once the ertapenem  is procured by the hemodialysis center.  Continue present therapeutic regimen as noted below, without change from yesterday:  * Acute metabolic encephalopathy 04/22/24 pt was altered on admission. Mental status quickly resolved to his baseline. Today, pt is alert and oriented x 4.     Orthostatic hypotension 04/22/24 chronic. Not uncommon for him to have SBP in the high 80s to 90s.  On midodrine  30 mg tid.  Prior dc summary in 03-2024 said that he was suppose to also be on Florinef  01 mg daily. Did not see that on his DC summary from 04-15-2024. Will go ahead and restart Florinef  0.1 mg daily.   **update, per discussed with nephrology Dr. Geralynn, pt's hypotension with SBP in the 80-90s is chronic for him and stable. Pt cleared for discharge today by nephrology.   Bilateral leg edema 04/22/24 chronic. I placed ACE bandage compression wraps to both his legs.   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 On IV Ivanz and Vanco through 04-29-2024.  Saw ID in clinic on 03-31-2024.   Adrenal insufficiency 04/22/24 chronic. On hydrocortisone  bid. 20 mg qam and 10 mg qpm.   Paroxysmal atrial fibrillation (HCC) 04/22/24 stable. On Eliquis . No rate controlling meds due to his chronic hypotension. EKG from admission shows rate controlled afib. In reviewing his prior hospital notes, pt had been placed on IV amiodarone  during 11-2023 admission. Amiodarone  had to be stopped due to subsequent bradycardia.   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 Coli that was cultured from pre-sacral abscess culture from 03-16-2024.  Per last office ID note from 03-31-2024 his last dose of IV  antibiotics should be Oct 24th, 2025. Continue IV Ertapenem .. Pharmacy aware.   Ulcerative colitis (HCC) - S/p total colectomy. From his Care Everywhere records, this appears to have been performed in [place] in June 2011 04/22/24 chronic. S/p total colectomy. From his Care Everywhere records, this appears to have been performed in [place] in June 2011   End-stage renal disease on hemodialysis (HCC) 04/22/24 pt seen by nephrology. Had HD yesterday.   Nephrostomy present (HCC) - left kidney. Care Everywhere records reviewed. appears to bave been ordered by Dr. Dasie Clark in [place] . placed [phone] 04/22/24  left sided. appears to have this changed every 2-3 months.   Colostomy status (HCC) -  Care Everywhere records reviewed. States  pt had total colectomy performed in June 2011 in [place] . 04/22/24 pt with ileostomy and prior total colectomy. Presumably from his hx of ulcerative colitis. His surgery was not performed at [facility]. Care Everywhere records reviewed. States pt had total colectomy performed in June 2011 in [place] .     Pressure injury of skin of multiple topographic sites 04/22/24 present on admission. Reason for Consult: LE wounds  Patient was seen by [facility] nursing team last week 10/8; has been DC and returned 10/13 for AMS   Wound type: venous ulcerations in the presence of PAD; apparently Unna's boots have been applied at some point; I did not feel comfortable placing orders last week without an ABI and known history of PAD.   Linear wounds on the left 2nd and 3rd toe; clean and dry; ok to apply xeroform every other day Black wound noted right medial 2nd toe; with history of PAD; continue to paint with betadine daily. Allow to air dry Large akin tear right arm, cleanse with saline, pat dry. Apply single layer of xeroform and change every other day and dry topper dressing.  Skin tear left elbow, skin flap present; attempt to re-aproximate skin flap and steristrip, apply single layer of  xeroform gauze and dry dressing  Skin tear left bicep, cleanse with saline, pat dry. Cover with single layer of xeroform and top with dry dressing or foam. Change every other day.  Skin tear left outer bicep; cleanse with saline, pat dry. Apply single layer of xeroform and top with dry dressing. Change every other day.  Dry lesion left shoulder blade See ostomy care below.  Last week patient had two lesions on the bilateral LEs, will have staff remove Unna's boots and verify with MD to replace.   DVT prophylaxis: Eliquis  Code Status: DNR Family Communication: Patient's daughter was at bedside throughout     Studies: No results found.  Principal Problem:   Acute metabolic encephalopathy Active Problems:   Orthostatic hypotension   Colostomy status (HCC) -  Care Everywhere records reviewed. States pt had total colectomy performed in June 2011 in [place] .   Nephrostomy present (HCC) - left kidney. Care Everywhere records reviewed. appears to bave been ordered by Dr. Dasie Clark in [place] . placed 02-2012   End-stage renal disease on hemodialysis (HCC)   Ulcerative colitis (HCC) - S/p total colectomy. 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

## 2024-04-23 NOTE — Plan of Care (Signed)
°  Problem: Clinical Measurements: Goal: Will remain free from infection Outcome: Progressing   Problem: Nutrition: Goal: Adequate nutrition will be maintained Outcome: Progressing   Problem: Coping: Goal: Level of anxiety will decrease Outcome: Progressing

## 2024-04-23 NOTE — Progress Notes (Signed)
 [facility] Progress Note   Subjective:   Patient seen and examined in room.  Sitting in bed side chair.  Discussed he would be going to dialysis today since he did not go yesterday.  Reluctantly agreed.  Denies chest pain, abdominal pain, shortness of breath, nausea, vomiting and diarrhea.    Called [facility] about update on antibiotics. No change or new information available.  Nurse stated to call back on Monday and they should have more information.  Center will need to provide a delivery date or have antibiotics in hand prior to discharging patient.   Objective Vitals:   04/23/24 0930 04/23/24 1000 04/23/24 1003 04/23/24 1030  BP: 100/60 96/60 96/60  98/67  Pulse: 67 74 75 79  Resp: 16 14 13  (!) 22  Temp:      TempSrc:      SpO2: 99% 95% 97% 100%  Weight:      Height:       Physical Exam General:chronically ill appearing male in NAD Heart:RRR Lungs:CTAB, nml WOB on RA Abdomen:soft, NTND Extremities:1+ LE edema, ace wraps in place b/l LE Dialysis Access: AVG    Filed Weights   04/18/24 1245 04/18/24 1553 04/23/24 0749  Weight: 67.4 kg 67.4 kg 70.2 kg    Intake/Output Summary (Last 24 hours) at 04/23/2024 1035 Last data filed at 04/23/2024 0200 Gross per 24 hour  Intake 200 ml  Output 1000 ml  Net -800 ml    Additional Objective Labs: Basic Metabolic Panel: Recent Labs  Lab 04/19/24 0413 04/20/24 0807 04/23/24 0800  NA 134* 135 132*  K 5.5* 4.7 4.9  CL 95* 96* 97*  CO2 22 22 18*  GLUCOSE 133* 127* 109*  BUN 48* 82* 95*  CREATININE 5.73* 8.05* 7.77*  CALCIUM  8.8* 9.1 8.8*  PHOS  --  5.4* 5.0*   Liver Function Tests: Recent Labs  Lab 04/18/24 0558 04/20/24 0807 04/23/24 0800  AST 25  --   --   ALT 17  --   --   ALKPHOS 112  --   --   BILITOT 1.2  --   --   PROT 6.7  --   --   ALBUMIN  3.3* 3.3* 3.0*   Recent Labs  Lab 04/18/24 0558  LIPASE 81*   CBC: Recent Labs  Lab 04/18/24 0558 04/18/24 0725 04/20/24 0807  04/23/24 0814  WBC 9.3 9.2 9.6 10.6*  NEUTROABS 7.9*  --  7.9*  --   HGB 10.5* 10.0* 10.3* 9.1*  HCT 32.9* 31.6* 31.4* 28.3*  MCV 116.3* 116.6* 115.0* 114.6*  PLT 123* 120* 150 177   Blood Culture    Component Value Date/Time   SDES BLOOD SITE NOT SPECIFIED 04/18/2024 0606   SPECREQUEST  04/18/2024 0606    BOTTLES DRAWN AEROBIC AND ANAEROBIC Blood Culture adequate volume   CULT  04/18/2024 0606    NO GROWTH 5 DAYS Performed at [facility], [address] [place], [place] [address]    REPTSTATUS 04/23/2024 FINAL 04/18/2024 0606   Medications:  ertapenem      ertapenem       apixaban   2.5 mg Oral BID   atorvastatin   20 mg Oral Daily   calcitRIOL   0.5 mcg Oral Q M,W,F-HD   Chlorhexidine  Gluconate Cloth  6 each Topical Q0600   Chlorhexidine  Gluconate Cloth  6 each Topical Q0600   dorzolamide   1 drop Right Eye QHS   fludrocortisone   0.1 mg Oral Daily   fluticasone  furoate-vilanterol  1 puff Inhalation  Daily   hydrocortisone   10 mg Oral Q24H   hydrocortisone   20 mg Oral Q24H   liver oil-zinc oxide   Topical BID   midodrine   30 mg Oral TID   pantoprazole   40 mg Oral BID   ramelteon   8 mg Oral QHS   timolol   1 drop Both Eyes BID    Dialysis Orders: MWF NW 2h   B400  72kg   2K bath   AVG    Heparin  none   Home bp meds: Midodrine  30mg  tid   Assessment/Plan: AMS: Returned to baseline.  ESRD: on HD MWF. Refused HD yesterday.  Agreed to run today. Next HD on 04/25/24 at [facility].  Hyperkalemia: K+ in 5s lately. He refuses a renal diet. Current K+ now is stable. Monitor trends closely. Consider Lokelma  on non-HD days as first step if no improvement. BP: chronic hypotension on high dose midodrine  30 tid and hydrocortisone  daily for adrenal failure.  Volume: LE edema on exam.  Volume removal limited by hypotension. UF as tolerated. Does not appear grossly overloaded.  Anemia of esrd: Hb 9.1 today, follow. Has been in goal. Will hold off on ESA for now.  Pre sacral  bscess w/ESBL producing E coli - on Ertapenem  until 04/29/24.  Temporary outpatient dialysis center has to special order it as noted below. Dispo: Awaiting SNF placement, plan is to transfer to [facility] while he is in rehab. Since he is going to a new HD clinic, we need to get the new clinic to special order the IV ertapenam which he needs for another 3 -4 doses (through 10/24 to complete a 4 wk course overall) and they will NEED  TO HAVE A DELIVERY DATE OR ANTIBIOTICS IN HAND BEFORE WE CAN D/C PATIENT. Clinic has no new information regarding antibiotic.  Advised to call back on Monday for update.    Joshua Labella, PA-C [facility] 04/23/2024,10:35 AM  LOS: 0 days

## 2024-04-24 DIAGNOSIS — G9341 Metabolic encephalopathy: Secondary | ICD-10-CM | POA: Diagnosis not present

## 2024-04-24 DIAGNOSIS — G9340 Encephalopathy, unspecified: Secondary | ICD-10-CM | POA: Diagnosis not present

## 2024-04-24 MED ORDER — CHLORHEXIDINE GLUCONATE CLOTH 2 % EX PADS
6.0000 | MEDICATED_PAD | Freq: Every day | CUTANEOUS | Status: DC
  Administered 2024-04-24 – 2024-04-26 (×3): 6 via TOPICAL

## 2024-04-24 NOTE — Progress Notes (Signed)
 [facility] Progress Note   Subjective:   Patient seen and examined in room. Sitting in bedside chair eating breakfast. Upset he is still unable to go home.  Discussed we have to wait until the dialysis center is able to get his antibiotics to safely discharge him. Updated him on status provided by HD center yesterday.   Objective Vitals:   04/23/24 1612 04/23/24 2200 04/24/24 0600 04/24/24 0810  BP: (!) 83/44 (!) 85/61 (!) 89/61 (!) 78/44  Pulse: 85 70 99 74  Resp: 16 16  18   Temp: (!) 97.5 F (36.4 C) 98.6 F (37 C)  98.5 F (36.9 C)  TempSrc: Oral Oral  Oral  SpO2: 100% 100%  98%  Weight:      Height:       Physical Exam General:chronically ill appearing male in NAD Heart:RRR Lungs:nml WOB on RA Abdomen:soft, +colostomy bag Extremities:+LE edema, wraps in place Dialysis Access: AVG   Filed Weights   04/18/24 1553 04/23/24 0749 04/23/24 1100  Weight: 67.4 kg 70.2 kg 70.2 kg    Intake/Output Summary (Last 24 hours) at 04/24/2024 0855 Last data filed at 04/24/2024 0810 Gross per 24 hour  Intake 780 ml  Output 1050 ml  Net -270 ml    Additional Objective Labs: Basic Metabolic Panel: Recent Labs  Lab 04/19/24 0413 04/20/24 0807 04/23/24 0800  NA 134* 135 132*  K 5.5* 4.7 4.9  CL 95* 96* 97*  CO2 22 22 18*  GLUCOSE 133* 127* 109*  BUN 48* 82* 95*  CREATININE 5.73* 8.05* 7.77*  CALCIUM  8.8* 9.1 8.8*  PHOS  --  5.4* 5.0*   Liver Function Tests: Recent Labs  Lab 04/18/24 0558 04/20/24 0807 04/23/24 0800  AST 25  --   --   ALT 17  --   --   ALKPHOS 112  --   --   BILITOT 1.2  --   --   PROT 6.7  --   --   ALBUMIN  3.3* 3.3* 3.0*   Recent Labs  Lab 04/18/24 0558  LIPASE 81*   CBC: Recent Labs  Lab 04/18/24 0558 04/18/24 0725 04/20/24 0807 04/23/24 0814  WBC 9.3 9.2 9.6 10.6*  NEUTROABS 7.9*  --  7.9*  --   HGB 10.5* 10.0* 10.3* 9.1*  HCT 32.9* 31.6* 31.4* 28.3*  MCV 116.3* 116.6* 115.0* 114.6*  PLT 123* 120* 150 177    Blood Culture    Component Value Date/Time   SDES BLOOD SITE NOT SPECIFIED 04/18/2024 0606   SPECREQUEST  04/18/2024 0606    BOTTLES DRAWN AEROBIC AND ANAEROBIC Blood Culture adequate volume   CULT  04/18/2024 0606    NO GROWTH 5 DAYS Performed at [facility], [address], [place], [place] [address]    REPTSTATUS 04/23/2024 FINAL 04/18/2024 0606    Medications:  ertapenem       apixaban   2.5 mg Oral BID   atorvastatin   20 mg Oral Daily   calcitRIOL   0.5 mcg Oral Q M,W,F-HD   Chlorhexidine  Gluconate Cloth  6 each Topical Q0600   Chlorhexidine  Gluconate Cloth  6 each Topical Q0600   dorzolamide   1 drop Right Eye QHS   fludrocortisone   0.1 mg Oral Daily   fluticasone  furoate-vilanterol  1 puff Inhalation Daily   hydrocortisone   10 mg Oral Q24H   hydrocortisone   20 mg Oral Q24H   liver oil-zinc oxide   Topical BID   midodrine   30 mg Oral TID   pantoprazole   40 mg Oral BID   ramelteon   8 mg Oral QHS   timolol   1 drop Both Eyes BID    Dialysis Orders: MWF NW 2h   B400  72kg   2K bath   AVG    Heparin  none   Home bp meds: Midodrine  30mg  tid   Assessment/Plan: AMS: Returned to baseline.  ESRD: on HD MWF. HD off schedule last week.  Resume regular schedule. Next HD on 04/25/24 at [facility]. CLIP as transient at [facility] MWF while admitted to SNF.  Hyperkalemia: K+ in 5s lately, better over the last couple days. He refuses a renal diet.  Monitor trends closely. Consider Lokelma  on non-HD days as first step if worsens again. BP: chronic hypotension on high dose midodrine  30 tid and hydrocortisone  daily for adrenal failure.  Volume: LE edema on exam.  Volume removal limited by hypotension. UF as tolerated. Under dry weight.  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 Has been in goal. Will hold off on ESA for now.  Pre sacral bscess w/ESBL producing E coli - on Ertapenem  1g IV qHD until 04/29/24.  Temporary outpatient dialysis  center has to special order it as noted below. Dispo: Awaiting SNF placement, plan is to transfer to [facility] while he is in rehab. Since he is going to a new HD clinic, we need to get the new clinic to special order the IV ertapenam which he needs for another 3 -4 doses (through 10/24 to complete a 4 wk course overall) and they will NEED TO HAVE A DELIVERY DATE OR ANTIBIOTICS IN HAND BEFORE WE CAN D/C PATIENT. Clinic has no new information regarding antibiotic.  Advised to call back on Monday for update.  Manuelita Labella, PA-C [facility] 04/24/2024,8:55 AM  LOS: 0 days

## 2024-04-24 NOTE — Plan of Care (Signed)

## 2024-04-24 NOTE — Progress Notes (Signed)
 Colostomy bag changed

## 2024-04-24 NOTE — Progress Notes (Signed)
 PROGRESS NOTE   Joshua Riley  FMW:[id number]    DOB: 1936/06/13    DOA: 04/18/2024  PCP: Katrinka Garnette KIDD, MD   I have briefly reviewed patients previous medical records in [facility].   Brief Hospital Course:  88 y.o. 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   Acute Encephalopathy has resolved.  Patient initially had declined SNF and wanted to go home but then after discussing with family, has agreed for SNF.  Now appears to have an SNF bed and outpatient HD slot continue HD center but is awaiting hepatitis B core antibody lab results without which he cannot be discharged.  DC summary was done by Dr. Laurence on 10/14, and updated by Dr. Dana 10/17.  Patient was supposed to discharge to SNF on 10/17 but they did not have the IV antibiotics on hand prior to discharging and hence discharge canceled.  Likely DC 10/20 after ICM follow-up with facility.   Assessment & Plan:   Acute metabolic encephalopathy Etiology unclear.  Appeared temporary and has resolved now for several days.  Orthostatic hypotension, chronic As per prior [facility] MD note, not uncommon for him to have SBP in the high 80s to 90s.  On midodrine  30 mg 3 times daily.  Also on Florinef  0.1 mg daily. Periodically gets  hypotensive with SBP in the 80s, sometimes in the 70s albeit asymptomatic.  Continue current management.  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 Seen in ID clinic on 03/31/2024.  Remains on IV Invanz  and vancomycin  through 04/29/2024 Outpatient ID clinic follow-up.  Adrenal insufficiency, chronic On hydrocortisone  twice daily.  As per Dr. Laurence, [facility] MD note from 10/14, from prior DC summary in September 2025, patient was supposed to be on Florinef  0.1 mg daily but apparently was not seen on the DC summary 04/15/2024.  He was started on Florinef  0.1 mg daily this admission.  Paroxysmal atrial fibrillation Not on rate control medications due to chronic hypotension.  During recent admission, amiodarone  was discontinued due to bradycardia. Remains on apixaban .  Extensive bruising noted  Ulcerative colitis S/p total colectomy and colostomy Stable  ESRD on HD Nephrology on board, and having HD per their direction.  S/p left percutaneous nephrostomy Appears chronic and changed every 2 to 3 months  Multiple pressure injuries of skin Please refer to details in DC summary done by Dr. Camellia Laurence, [facility] MD on 10/14 for details. Continue wound care.  WOC RN seen during prior admission on 10/8.  Anemia of chronic disease and ESRD Stable.   Body mass index is 23.53 kg/m.   DVT prophylaxis: apixaban  (ELIQUIS ) tablet 2.5 mg Start: 04/18/24 2200     Code Status: Limited: Do not attempt resuscitation (DNR) -DNR-LIMITED -Do Not Intubate/DNI :  Family Communication: None at bedside Disposition:  Status is: Observation The patient remains OBS appropriate  and will d/c before 2 midnights.  Patient is medically stable and awaiting DC to SNF arrangement for IV antibiotics.     Consultants:   Nephrology  Procedures:   Hemodialysis  Subjective:  Remained without complaints for  several days.  Really wants to get out of the hospital.  Aware of reasons for delay to going to SNF.  Objective:   Vitals:   04/23/24 2200 04/24/24 0600 04/24/24 0810 04/24/24 1137  BP: (!) 85/61 (!) 89/61 (!) 78/44 (!) 86/61  Pulse: 70 99 74   Resp: 16  18   Temp: 98.6 F (37 C)  98.5 F (36.9 C)   TempSrc: Oral  Oral   SpO2: 100%  98%   Weight:      Height:        General exam: Elderly male, moderately built and frail, chronically ill looking, sitting up comfortably in reclining chair without distress. Respiratory system: Slightly diminished breath sounds in the bases but otherwise clear to auscultation.  Stable. Cardiovascular system: S1 & S2 heard, RRR. No JVD, murmurs, rubs, gallops or clicks. No pedal edema. Gastrointestinal system: Abdomen is nondistended, soft and nontender. No organomegaly or masses felt. Normal bowel sounds heard.  Has right-sided colostomy with formed brown stools.  No blood or melena. Central nervous system: Alert and oriented. No focal neurological deficits. Extremities: Symmetric 5 x 5 power. Skin: Extensive bruising, especially of right upper extremity, right shoulder.  Spider nevi on upper anterior chest.  Left lower back percutaneous drain. Psychiatry: Judgement and insight appear normal. Mood & affect appropriate.     Data Reviewed:   I have personally reviewed following labs and imaging studies   CBC: Recent Labs  Lab 04/18/24 0558 04/18/24 0725 04/20/24 0807 04/23/24 0814  WBC 9.3 9.2 9.6 10.6*  NEUTROABS 7.9*  --  7.9*  --   HGB 10.5* 10.0* 10.3* 9.1*  HCT 32.9* 31.6* 31.4* 28.3*  MCV 116.3* 116.6* 115.0* 114.6*  PLT 123* 120* 150 177    Basic Metabolic Panel: Recent Labs  Lab 04/18/24 0558 04/18/24 0725 04/19/24 0413 04/20/24 0807 04/23/24 0800  NA 138  --  134* 135 132*  K 5.6*  --  5.5* 4.7 4.9  CL 100  --  95* 96* 97*  CO2 20*  --  22 22 18*  GLUCOSE 115*  --  133* 127* 109*  BUN 89*  --  48* 82* 95*  CREATININE  8.71* 8.76* 5.73* 8.05* 7.77*  CALCIUM  9.8  --  8.8* 9.1 8.8*  PHOS  --   --   --  5.4* 5.0*    Liver Function Tests: Recent Labs  Lab 04/18/24 0558 04/20/24 0807 04/23/24 0800  AST 25  --   --   ALT 17  --   --   ALKPHOS 112  --   --   BILITOT 1.2  --   --   PROT 6.7  --   --   ALBUMIN  3.3* 3.3* 3.0*    CBG: No results for input(s): GLUCAP in the last 168 hours.  Microbiology Studies:   Recent Results (from the past 240 hours)  Culture, blood (routine x 2)     Status: None   Collection Time: 04/18/24  6:01 AM   Specimen: BLOOD  Result Value Ref Range Status   Specimen Description BLOOD SITE NOT SPECIFIED  Final   Special Requests   Final    BOTTLES DRAWN AEROBIC AND ANAEROBIC Blood Culture results may not be optimal due  to an inadequate volume of blood received in culture bottles   Culture   Final    NO GROWTH 5 DAYS Performed at [facility], [address] [place], [place] [address]    Report Status 04/23/2024 FINAL  Final  Resp panel by RT-PCR (RSV, Flu A&B, Covid) Anterior Nasal Swab     Status: None   Collection Time: 04/18/24  6:01 AM   Specimen: Anterior Nasal Swab  Result Value Ref Range Status   SARS Coronavirus 2 by RT PCR NEGATIVE NEGATIVE Final   Influenza A by PCR NEGATIVE NEGATIVE Final   Influenza B by PCR NEGATIVE NEGATIVE Final    Comment: (NOTE) The Xpert Xpress SARS-CoV-2/FLU/RSV plus assay is intended as an aid in the diagnosis of influenza from Nasopharyngeal swab specimens and should not be used as a sole basis for treatment. Nasal washings and aspirates are unacceptable for Xpert Xpress SARS-CoV-2/FLU/RSV testing.  Fact Sheet for Patients: BloggerCourse.com  Fact Sheet for Healthcare Providers: SeriousBroker.it  This test is not yet approved or cleared by the [place]  FDA and has been authorized for detection and/or diagnosis of SARS-CoV-2 by FDA under an Emergency Use  Authorization (EUA). This EUA will remain in effect (meaning this test can be used) for the duration of the COVID-19 declaration under Section 564(b)(1) of the Act, 21 U.S.C. section 360bbb-3(b)(1), unless the authorization is terminated or revoked.     Resp Syncytial Virus by PCR NEGATIVE NEGATIVE Final    Comment: (NOTE) Fact Sheet for Patients: BloggerCourse.com  Fact Sheet for Healthcare Providers: SeriousBroker.it  This test is not yet approved or cleared by the [place]  FDA and has been authorized for detection and/or diagnosis of SARS-CoV-2 by FDA under an Emergency Use Authorization (EUA). This EUA will remain in effect (meaning this test can be used) for the duration of the COVID-19 declaration under Section 564(b)(1) of the Act, 21 U.S.C. section 360bbb-3(b)(1), unless the authorization is terminated or revoked.  Performed at [facility], [address], [place], [place] [address]   Culture, blood (routine x 2)     Status: None   Collection Time: 04/18/24  6:06 AM   Specimen: BLOOD  Result Value Ref Range Status   Specimen Description BLOOD SITE NOT SPECIFIED  Final   Special Requests   Final    BOTTLES DRAWN AEROBIC AND ANAEROBIC Blood Culture adequate volume   Culture   Final    NO GROWTH 5 DAYS Performed at [facility], [address] [place], [place] [address]    Report Status 04/23/2024 FINAL  Final    Radiology Studies:  No results found.   Scheduled Meds:    apixaban   2.5 mg Oral BID   atorvastatin   20 mg Oral Daily   calcitRIOL   0.5 mcg Oral Q M,W,F-HD   Chlorhexidine  Gluconate Cloth  6 each Topical Q0600   Chlorhexidine  Gluconate Cloth  6 each Topical Q0600   Chlorhexidine  Gluconate Cloth  6 each Topical Q0600   dorzolamide   1 drop Right Eye QHS   fludrocortisone   0.1 mg Oral Daily   fluticasone  furoate-vilanterol  1 puff Inhalation Daily   hydrocortisone   10 mg Oral Q24H    hydrocortisone   20 mg Oral Q24H   liver oil-zinc oxide   Topical BID   midodrine   30 mg Oral TID   pantoprazole   40 mg Oral BID   ramelteon   8 mg Oral QHS   timolol   1 drop Both Eyes  BID    Continuous Infusions:    ertapenem        LOS: 0 days     Trenda Mar, MD,  FACP, [facility], [facility], [facility]   [facility] [place]      To contact the attending provider between 7A-7P or the covering provider during after hours 7P-7A, please log into the web site [email or web address] and access using universal [place] password for that web site. If you do not have the password, please call the hospital operator.  04/24/2024, 1:49 PM

## 2024-04-24 NOTE — Plan of Care (Signed)
  Problem: Education: Goal: Knowledge of General Education information will improve Description: Including pain rating scale, medication(s)/side effects and non-pharmacologic comfort measures Outcome: Progressing   Problem: Health Behavior/Discharge Planning: Goal: Ability to manage health-related needs will improve Outcome: Progressing   Problem: Clinical Measurements: Goal: Ability to maintain clinical measurements within normal limits will improve Outcome: Progressing Goal: Will remain free from infection Outcome: Progressing Goal: Diagnostic test results will improve Outcome: Progressing Goal: Respiratory complications will improve Outcome: Progressing Goal: Cardiovascular complication will be avoided Outcome: Progressing   Problem: Activity: Goal: Risk for activity intolerance will decrease Outcome: Progressing   Problem: Nutrition: Goal: Adequate nutrition will be maintained Outcome: Progressing   Problem: Coping: Goal: Level of anxiety will decrease Outcome: Progressing   Problem: Elimination: Goal: Will not experience complications related to bowel motility Outcome: Progressing   Problem: Pain Managment: Goal: General experience of comfort will improve and/or be controlled Outcome: Progressing

## 2024-04-25 ENCOUNTER — Other Ambulatory Visit

## 2024-04-25 DIAGNOSIS — G9340 Encephalopathy, unspecified: Secondary | ICD-10-CM | POA: Diagnosis not present

## 2024-04-25 DIAGNOSIS — G9341 Metabolic encephalopathy: Secondary | ICD-10-CM | POA: Diagnosis not present

## 2024-04-25 MED ORDER — MIDODRINE HCL 5 MG PO TABS
ORAL_TABLET | ORAL | Status: AC
  Filled 2024-04-25: qty 6

## 2024-04-25 MED ORDER — NEPRO/CARBSTEADY PO LIQD: 237.0000 mL | ORAL | Status: DC | PRN

## 2024-04-25 MED ORDER — ALTEPLASE 2 MG IJ SOLR: 2.0000 mg | Freq: Once | INTRAMUSCULAR | Status: DC | PRN

## 2024-04-25 MED ORDER — OXYCODONE HCL 5 MG PO TABS
5.0000 mg | ORAL_TABLET | ORAL | Status: AC
  Administered 2024-04-25: 5 mg via ORAL
  Filled 2024-04-25: qty 1

## 2024-04-25 MED ORDER — HEPARIN SODIUM (PORCINE) 1000 UNIT/ML DIALYSIS
1000.0000 [IU] | INTRAMUSCULAR | Status: DC | PRN
Start: 2024-04-25 — End: 2024-04-25

## 2024-04-25 MED ORDER — PENTAFLUOROPROP-TETRAFLUOROETH EX AERO: 1.0000 | INHALATION_SPRAY | CUTANEOUS | Status: DC | PRN

## 2024-04-25 MED ORDER — LIDOCAINE HCL (PF) 1 % IJ SOLN: 5.0000 mL | INTRAMUSCULAR | Status: DC | PRN

## 2024-04-25 MED ORDER — MIDODRINE HCL 5 MG PO TABS
20.0000 mg | ORAL_TABLET | ORAL | Status: AC
  Administered 2024-04-25: 20 mg via ORAL
  Filled 2024-04-25: qty 4

## 2024-04-25 MED ORDER — TRAMADOL HCL 50 MG PO TABS: 50.0000 mg | ORAL_TABLET | Freq: Two times a day (BID) | ORAL | 0 refills | Status: DC | PRN

## 2024-04-25 MED ORDER — LIDOCAINE-PRILOCAINE 2.5-2.5 % EX CREA: 1.0000 | TOPICAL_CREAM | CUTANEOUS | Status: DC | PRN

## 2024-04-25 MED ORDER — CALCITRIOL 0.5 MCG PO CAPS
ORAL_CAPSULE | ORAL | Status: AC
  Filled 2024-04-25: qty 1

## 2024-04-25 MED ORDER — ANTICOAGULANT SODIUM CITRATE 4% (200MG/5ML) IV SOLN: 5.0000 mL | Status: DC | PRN

## 2024-04-25 NOTE — Plan of Care (Signed)

## 2024-04-25 NOTE — Progress Notes (Signed)
 PROGRESS NOTE   Joshua Riley  FMW:[id number]    DOB: 1936-03-31    DOA: 04/18/2024  PCP: Katrinka Garnette KIDD, MD   I have briefly reviewed patients previous medical records in [facility].   Brief Hospital Course:  88 y.o. 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   Acute Encephalopathy has resolved.  Patient initially had declined SNF and wanted to go home but then after discussing with family, has agreed for SNF.  Now appears to have an SNF bed and outpatient HD slot continue HD center but is awaiting hepatitis B core antibody lab results without which he cannot be discharged.  DC summary was done by Dr. Laurence on 10/14, and updated by Dr. Dana 10/17.  Patient was supposed to discharge to SNF on 10/17 but they did not have the IV antibiotics on hand prior to discharging and hence discharge canceled.  Likely DC 10/20 after ICM follow-up with facility.  As per ICM 10/20, does not appear that outpatient nephrology clinic have antibiotics yet.   Assessment & Plan:   Acute metabolic encephalopathy Etiology unclear.  Appeared temporary and has resolved now for several days.  Orthostatic hypotension, chronic Chronic and intermittent.  Mostly on HD.  Remains on midodrine  30 mg 3 times daily  and Florinef  0.1 mg daily.  Nephrology aware.  Mostly asymptomatic.  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 Seen in ID clinic on 03/31/2024.  Remains on IV Invanz  and vancomycin  through 04/29/2024 Outpatient ID clinic follow-up.  Adrenal insufficiency, chronic On hydrocortisone  twice daily.  As per Dr. Laurence, [facility] MD note from 10/14, from prior DC summary in September 2025, patient was supposed to be on Florinef  0.1 mg daily but apparently was not seen on the DC summary 04/15/2024.  He was started on Florinef  0.1 mg daily this admission.  Paroxysmal atrial fibrillation Not on rate control medications due to chronic hypotension.  During recent admission, amiodarone  was discontinued due to bradycardia. Remains on apixaban .  Extensive bruising noted  Ulcerative colitis S/p total colectomy and colostomy Stable  ESRD on HD Nephrology on board, and having HD per their direction.  Seen at HD this morning.  S/p left percutaneous nephrostomy Appears chronic and changed every 2 to 3 months  Multiple pressure injuries of skin Please refer to details in DC summary done by Dr. Camellia Laurence, [facility] MD on 10/14 for details. Continue wound care.  WOC RN seen during prior admission on 10/8.  Anemia of chronic disease and ESRD Stable.   Body mass index is 23.77 kg/m.   DVT prophylaxis: apixaban  (ELIQUIS ) tablet 2.5 mg Start: 04/18/24 2200     Code Status: Limited: Do not attempt resuscitation (DNR) -DNR-LIMITED -Do Not Intubate/DNI :  Family Communication: None at bedside Disposition:  Status is: Observation The patient remains OBS appropriate and will d/c before  2 midnights.  Patient is medically stable and awaiting DC to SNF pending arrangement for IV antibiotics to be arranged by outpatient nephrology clinic.  He will not be able to DC until they have procured IV antibiotics.      Consultants:   Nephrology  Procedures:   Hemodialysis  Subjective:  Seen at HD.  Denies complaints.  Frustrated that he is still here and has not been discharged.  Objective:   Vitals:   04/25/24 0930 04/25/24 1000 04/25/24 1015 04/25/24 1030  BP: (!) 73/59 (!) 74/39 (!) 84/63 (!) 77/55  Pulse: 71 63 76 89  Resp: 12 14 12 16   Temp:      TempSrc:      SpO2: 99% 100% 100% 93%  Weight:      Height:        General exam: Elderly male, moderately built and frail, chronically ill looking, comfortably propped up in bed without distress and undergoing HD. Respiratory system: Slightly diminished breath sounds in the bases but otherwise clear to auscultation.  Stable. Cardiovascular system: S1 & S2 heard, RRR. No JVD, murmurs, rubs, gallops or clicks. No pedal edema. Gastrointestinal system: Abdomen is nondistended, soft and nontender. No organomegaly or masses felt. Normal bowel sounds heard.  Has right-sided colostomy with formed brown stools.  No blood or melena. Central nervous system: Alert and oriented. No focal neurological deficits. Extremities: Symmetric 5 x 5 power. Skin: Extensive bruising, especially of right upper extremity, right shoulder.  Spider nevi on upper anterior chest.  Left lower back percutaneous drain. Psychiatry: Judgement and insight appear normal. Mood & affect appropriate.     Data Reviewed:   I have personally reviewed following labs and imaging studies   CBC: Recent Labs  Lab 04/20/24 0807 04/23/24 0814  WBC 9.6 10.6*  NEUTROABS 7.9*  --   HGB 10.3* 9.1*  HCT 31.4* 28.3*  MCV 115.0* 114.6*  PLT 150 177    Basic Metabolic Panel: Recent Labs  Lab 04/19/24 0413 04/20/24 0807 04/23/24 0800  NA 134* 135 132*  K 5.5* 4.7 4.9  CL 95* 96* 97*  CO2 22 22 18*  GLUCOSE 133* 127* 109*  BUN 48* 82* 95*  CREATININE 5.73* 8.05* 7.77*  CALCIUM  8.8* 9.1 8.8*  PHOS  --  5.4* 5.0*    Liver Function Tests: Recent Labs  Lab 04/20/24 0807  04/23/24 0800  ALBUMIN  3.3* 3.0*    CBG: No results for input(s): GLUCAP in the last 168 hours.  Microbiology Studies:   Recent Results (from the past 240 hours)  Culture, blood (routine x 2)     Status: None   Collection Time: 04/18/24  6:01 AM   Specimen: BLOOD  Result Value Ref Range Status   Specimen Description BLOOD SITE NOT SPECIFIED  Final   Special Requests   Final    BOTTLES DRAWN AEROBIC AND ANAEROBIC Blood Culture results may not be optimal due to an inadequate volume of blood received in culture bottles   Culture   Final    NO GROWTH 5 DAYS Performed at [facility], [address] [place], [place] [address]    Report Status 04/23/2024 FINAL  Final  Resp panel by RT-PCR (RSV, Flu A&B, Covid) Anterior Nasal Swab     Status: None   Collection Time: 04/18/24  6:01 AM   Specimen: Anterior Nasal Swab  Result Value Ref Range Status   SARS Coronavirus 2 by RT PCR NEGATIVE NEGATIVE Final   Influenza A by  PCR NEGATIVE NEGATIVE Final   Influenza B by PCR NEGATIVE NEGATIVE Final    Comment: (NOTE) The Xpert Xpress SARS-CoV-2/FLU/RSV plus assay is intended as an aid in the diagnosis of influenza from Nasopharyngeal swab specimens and should not be used as a sole basis for treatment. Nasal washings and aspirates are unacceptable for Xpert Xpress SARS-CoV-2/FLU/RSV testing.  Fact Sheet for Patients: BloggerCourse.com  Fact Sheet for Healthcare Providers: SeriousBroker.it  This test is not yet approved or cleared by the [place]  FDA and has been authorized for detection and/or diagnosis of SARS-CoV-2 by FDA under an Emergency Use Authorization (EUA). This EUA will remain in effect (meaning this test can be used) for the duration of the COVID-19 declaration under Section 564(b)(1) of the Act, 21 U.S.C. section 360bbb-3(b)(1), unless the authorization is terminated or revoked.     Resp Syncytial Virus by  PCR NEGATIVE NEGATIVE Final    Comment: (NOTE) Fact Sheet for Patients: BloggerCourse.com  Fact Sheet for Healthcare Providers: SeriousBroker.it  This test is not yet approved or cleared by the [place]  FDA and has been authorized for detection and/or diagnosis of SARS-CoV-2 by FDA under an Emergency Use Authorization (EUA). This EUA will remain in effect (meaning this test can be used) for the duration of the COVID-19 declaration under Section 564(b)(1) of the Act, 21 U.S.C. section 360bbb-3(b)(1), unless the authorization is terminated or revoked.  Performed at [facility], [address] [place], [place] [address]   Culture, blood (routine x 2)     Status: None   Collection Time: 04/18/24  6:06 AM   Specimen: BLOOD  Result Value Ref Range Status   Specimen Description BLOOD SITE NOT SPECIFIED  Final   Special Requests   Final    BOTTLES DRAWN AEROBIC AND ANAEROBIC Blood Culture adequate volume   Culture   Final    NO GROWTH 5 DAYS Performed at [facility], [address], [place], [place] [address]    Report Status 04/23/2024 FINAL  Final    Radiology Studies:  No results found.   Scheduled Meds:    apixaban   2.5 mg Oral BID   atorvastatin   20 mg Oral Daily   calcitRIOL   0.5 mcg Oral Q M,W,F-HD   Chlorhexidine  Gluconate Cloth  6 each Topical Q0600   Chlorhexidine  Gluconate Cloth  6 each Topical Q0600   Chlorhexidine  Gluconate Cloth  6 each Topical Q0600   dorzolamide   1 drop Right Eye QHS   fludrocortisone   0.1 mg Oral Daily   fluticasone  furoate-vilanterol  1 puff Inhalation Daily   hydrocortisone   10 mg Oral Q24H   hydrocortisone   20 mg Oral Q24H   liver oil-zinc oxide   Topical BID   midodrine   30 mg Oral TID   pantoprazole   40 mg Oral BID   ramelteon   8 mg Oral QHS   timolol   1 drop Both Eyes BID    Continuous Infusions:    anticoagulant sodium citrate      ertapenem        LOS: 0  days     Trenda Mar, MD,  FACP, [facility], [facility], [facility]   [facility] Ad[facility] [place]      To contact the attending provider between 7A-7P or the covering provider during after hours 7P-7A, please log into the web site [email or web address] and access using universal [place] password for that web site. If you do not have the password, please call the hospital operator.  04/25/2024,  10:41 AM

## 2024-04-25 NOTE — Progress Notes (Signed)
 PT Cancellation Note  Patient Details Name: Rajiv Parlato MRN: [id number] DOB: [birth year]   Cancelled Treatment:    Reason Eval/Treat Not Completed: Medical issues which prohibited therapy (Hypotensive post HD: BP supine 79/50, MAP 59, HR 61)  Will continue to follow;   Silvano Currier, PT  Acute Rehabilitation Services Office [phone] Secure Chat welcomed    Silvano VEAR Currier 04/25/2024, 2:20 PM

## 2024-04-25 NOTE — Progress Notes (Signed)
 PT Cancellation Note  Patient Details Name: Joshua Riley MRN: [id number] DOB: 06/16/1936   Cancelled Treatment:    Reason Eval/Treat Not Completed: Patient at procedure or test/unavailable  Currently in HD;  Will follow up later today as time allows;  Otherwise, will follow up for PT tomorrow;  Noting pt will likely be going to SNF today; PT continues to be in agreement with plan;  Thank you,  Silvano Currier, PT  Acute Rehabilitation Services Office [phone]    Silvano VEAR Currier 04/25/2024, 9:26 AM

## 2024-04-25 NOTE — Procedures (Signed)
 Patient seen on Hemodialysis- insists that he will not run any longer than 2h . BP (!) 84/63   Pulse 76   Temp 98 F (36.7 C)   Resp 12   Ht 5' 8 (1.727 m)   Wt 70.9 kg   SpO2 100%   BMI 23.77 kg/m   QB 400, UF goal 1L Tolerating treatment without clinical complaints at this time.   Gordy Blanch MD [facility]. Office # [phone] Pager # [phone] 10:31 AM

## 2024-04-25 NOTE — Progress Notes (Signed)
 Received patient in bed to unit.  Alert and oriented.  Informed consent signed and in chart.   TX duration:2 hours and 45 minutes.  Patient only likes to run for 2 hours and 45 minutes.  Signed AMA paperwork to come off early.  Patient tolerated well.  Transported back to the room  Alert, without acute distress.  Hand-off given to patient's nurse.   Access used: Left Femoral graft Access issues: none  Total UF removed: Medication(s) given: midodrine , calcitriol , see MAR   04/25/24 1050  Vitals  Temp 98 F (36.7 C)  Temp Source Axillary  BP (!) 86/49  MAP (mmHg) (!) 61  Pulse Rate 80  ECG Heart Rate 80  Resp 13  Oxygen Therapy  SpO2 98 %  O2 Device Room Air  During Treatment Monitoring  Duration of HD Treatment -hour(s) 2.75 hour(s)  HD Safety Checks Performed Yes  Intra-Hemodialysis Comments See progress note (Patient terminated session, only wanted to run 2 hours and 45 minutes.)  Post Treatment  Dialyzer Clearance Clear  Liters Processed 66  Fluid Removed (mL) 400 mL  Tolerated HD Treatment No (Comment)  Post-Hemodialysis Comments Patient SBP under 80  AVG/AVF Arterial Site Held (minutes) 7 minutes  AVG/AVF Venous Site Held (minutes) 7 minutes  Fistula / Graft Left Thigh Arteriovenous fistula  Placement Date: (c)    Placed prior to admission: Yes  Orientation: Left  Access Location: Thigh  Access Type: Arteriovenous fistula  Site Condition No complications  Fistula / Graft Assessment Present;Thrill;Bruit  Status Patent;Deaccessed  Drainage Description None     Camellia Brasil LPN [facility]

## 2024-04-25 NOTE — Progress Notes (Signed)
 [facility] Progress Note   Subjective:   Pt seen on HD. BP has been soft throughout. He is asymptomatic and UF is off. Only wants to run for 2 hr and . Denies SOB, CP, dizziness, palpitations, edema, nausea. Frustrated that he is still in the hospital.   Objective Vitals:   04/25/24 0830 04/25/24 0845 04/25/24 0900 04/25/24 0930  BP: (!) 73/47 (!) 71/60 (!) 79/53 (!) 73/59  Pulse:  85 80 71  Resp: 12 12 15 12   Temp:      TempSrc:      SpO2:  100% 100% 99%  Weight:      Height:       Physical Exam General: Alert, elderly male in NAD Heart: RRR, no murmurs, rubs or gallops Lungs: CTA bilaterally, respirations unlabored Abdomen: Soft, non-distended, +BS Extremities: No edema b/l lower extremities Dialysis Access: AVG + t/b  Additional Objective Labs: Basic Metabolic Panel: Recent Labs  Lab 04/19/24 0413 04/20/24 0807 04/23/24 0800  NA 134* 135 132*  K 5.5* 4.7 4.9  CL 95* 96* 97*  CO2 22 22 18*  GLUCOSE 133* 127* 109*  BUN 48* 82* 95*  CREATININE 5.73* 8.05* 7.77*  CALCIUM  8.8* 9.1 8.8*  PHOS  --  5.4* 5.0*   Liver Function Tests: Recent Labs  Lab 04/20/24 0807 04/23/24 0800  ALBUMIN  3.3* 3.0*   No results for input(s): LIPASE, AMYLASE in the last 168 hours. CBC: Recent Labs  Lab 04/20/24 0807 04/23/24 0814  WBC 9.6 10.6*  NEUTROABS 7.9*  --   HGB 10.3* 9.1*  HCT 31.4* 28.3*  MCV 115.0* 114.6*  PLT 150 177   Blood Culture    Component Value Date/Time   SDES BLOOD SITE NOT SPECIFIED 04/18/2024 0606   SPECREQUEST  04/18/2024 0606    BOTTLES DRAWN AEROBIC AND ANAEROBIC Blood Culture adequate volume   CULT  04/18/2024 0606    NO GROWTH 5 DAYS Performed at [facility], [address] [place], [place] [address]    REPTSTATUS 04/23/2024 FINAL 04/18/2024 0606    Cardiac Enzymes: No results for input(s): CKTOTAL, CKMB, CKMBINDEX, TROPONINI in the last 168 hours. CBG: No results for input(s): GLUCAP in the  last 168 hours. Iron Studies: No results for input(s): IRON, TIBC, TRANSFERRIN, FERRITIN in the last 72 hours. @lablastinr3 @ Studies/Results: No results found. Medications:  anticoagulant sodium citrate      ertapenem       apixaban   2.5 mg Oral BID   atorvastatin   20 mg Oral Daily   calcitRIOL   0.5 mcg Oral Q M,W,F-HD   Chlorhexidine  Gluconate Cloth  6 each Topical Q0600   Chlorhexidine  Gluconate Cloth  6 each Topical Q0600   Chlorhexidine  Gluconate Cloth  6 each Topical Q0600   dorzolamide   1 drop Right Eye QHS   fludrocortisone   0.1 mg Oral Daily   fluticasone  furoate-vilanterol  1 puff Inhalation Daily   hydrocortisone   10 mg Oral Q24H   hydrocortisone   20 mg Oral Q24H   liver oil-zinc oxide   Topical BID   midodrine   30 mg Oral TID   pantoprazole   40 mg Oral BID   ramelteon   8 mg Oral QHS   timolol   1 drop Both Eyes BID    Dialysis Orders: MWF NW 2h   B400  72kg   2K bath   AVG    Heparin  none   Home bp meds: Midodrine  30mg  tid  Assessment/Plan: AMS: Returned to baseline.  ESRD: on HD MWF.  HD off schedule last week.  Now back on regular schedule. He limits his treatment time to 2 hr CLIP as transient at [facility] MWF while admitted to SNF.  Hyperkalemia: K+ in 5s lately, better over the last couple days. He refuses a renal diet.  Consider Lokel[facility]  on non-HD days as first step if worsens again. BP: chronic hypotension on high dose midodrine  30 tid and hydrocortisone  daily for adrenal failure.  Volume:  Volume removal limited by hypotension but edema appears to be improved. UF as tolerated. Under dry weight.  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 Has been in goal. Will hold off on ESA for now.  Pre sacral bscess w/ESBL producing E coli - on Ertapenem  1g IV qHD until 04/29/24.  Temporary outpatient dialysis center has to special order it as noted below. Dispo: Awaiting SNF placement, plan is to transfer to [facility] while he is in rehab. Since he is going to a new HD clinic, we need to get the new clinic to special order the IV ertapenam which he needs for another 3 -4 doses (through 10/24 to complete a 4 wk course overall) and they will NEED TO HAVE A DELIVERY DATE OR ANTIBIOTICS IN HAND BEFORE WE CAN D/C PATIENT. Clinic has no new information regarding antibiotic.  Advised to call back on Monday for update.  Lucie Collet, PA-C 04/25/2024, 9:49 AM  [facility] Pager: [phone]

## 2024-04-25 NOTE — Progress Notes (Signed)
   04/25/24 1123  Assess: MEWS Score  Temp 98.3 F (36.8 C)  BP (!) 74/53  MAP (mmHg) (!) 62  Pulse Rate 74  Resp 19  Level of Consciousness Alert  SpO2 99 %  O2 Device Room Air  Assess: MEWS Score  MEWS Temp 0  MEWS Systolic 2  MEWS Pulse 0  MEWS RR 0  MEWS LOC 0  MEWS Score 2  MEWS Score Color Yellow  Assess: if the MEWS score is Yellow or Red  Were vital signs accurate and taken at a resting state? Yes  Does the patient meet 2 or more of the SIRS criteria? No  MEWS guidelines implemented  No, previously yellow, continue vital signs every 4 hours  Assess: SIRS CRITERIA  SIRS Temperature  0  SIRS Respirations  0  SIRS Pulse 0  SIRS WBC 0  SIRS Score Sum  0

## 2024-04-25 NOTE — Progress Notes (Addendum)
 Reached out regarding update on meds being ordered at clinic, still pending. Clinic is going to request update, informed clinic that we need to hear back today ASAP. Will continue to request info and monitor.   Joshua Riley Dialysis Nav [phone]  Addendum 1212 Requested update and contacted clinic, approval to give this abx in clinic is still not showing approved in their system, clinic stated they are doing everything they can to get it approved at this time. Will continue to assist.   Addendum 3:41pm Contacted [facility], no update at this time, still not approved for iv abx, will continue to assist.  Addendum 3:50pm  Clinic has received approval to give iv abx. Will have by Wednesday, care team including MD, nephrology, SW. CM, and RN notified.

## 2024-04-25 NOTE — Plan of Care (Signed)

## 2024-04-25 NOTE — TOC Progression Note (Signed)
 Transition of Care [facility]) - Progression Note    Patient Details  Name: Joshua Riley MRN: [id number] Date of Birth: 29-Jul-1935  [facility]) CM/SW Contact  Bridget Cordella Simmonds, LCSW Phone Number: 04/25/2024, 4:00 PM  Clinical Narrative:   Several phone calls with daughter [name] during the day.  She is quite frustrated with all the delays.  1600: Message from Abigail/renal: HD clinic all set for IV abx on Wed. CSW spoke with [facility]: she can receive pt tomorrow. CSW updated [name].  She does still want to use ambulance transport, aware she could be billed.    Expected Discharge Plan: Skilled Nursing Facility Barriers to Discharge: Continued Medical Work up               Expected Discharge Plan and Services In-house Referral: Clinical Social Work Discharge Planning Services: CM Consult Post Acute Care Choice: Skilled Nursing Facility Living arrangements for the past 2 months: Apartment Expected Discharge Date: 04/22/24                                     Social Drivers of Health (SDOH) Interventions SDOH Screenings   Food Insecurity: No Food Insecurity (04/18/2024)  Housing: Low Risk  (04/18/2024)  Transportation Needs: No Transportation Needs (04/18/2024)  Utilities: Not At Risk (04/18/2024)  Alcohol Screen: Low Risk  (10/26/2023)  Depression (PHQ2-9): Medium Risk (04/07/2024)  Financial Resource Strain: Low Risk  (10/26/2023)  Physical Activity: Insufficiently Active (10/26/2023)  Social Connections: Patient Declined (04/18/2024)  Recent Concern: Social Connections - Socially Isolated (01/27/2024)  Stress: Stress Concern Present (10/26/2023)  Tobacco Use: Medium Risk (04/18/2024)    Readmission Risk Interventions    12/03/2023   11:30 AM 11/10/2023    3:46 PM 07/21/2023   12:26 PM  Readmission Risk Prevention Plan  Transportation Screening Complete Complete Complete  PCP or Specialist Appt within 5-7 Days  Complete   Home Care  Screening  Complete   Medication Review (RN CM)  Complete   HRI or Home Care Consult   Complete  Social Work Consult for Recovery Care Planning/Counseling   Complete  Palliative Care Screening   Not Applicable  Medication Review Oceanographer)   Complete  SW Recovery Care/Counseling Consult Complete    Palliative Care Screening Complete    Skilled Nursing Facility Complete

## 2024-04-26 DIAGNOSIS — G9340 Encephalopathy, unspecified: Secondary | ICD-10-CM | POA: Diagnosis not present

## 2024-04-26 MED ORDER — SODIUM CHLORIDE 0.9 % IV SOLN: 1.0000 g | INTRAVENOUS | Status: DC

## 2024-04-26 NOTE — Progress Notes (Signed)
 Late note entry 10/21 2:42pm D/c noted. Contacted [facility], informed of d/c and anticipated arrival tomorrow. Clinic reminded of iv abx at this time, no items needed to fax over, as clinic gathered all info already. No further support needed.   Lavanda Choice [facility] D[facility] Navigator [phone]

## 2024-04-26 NOTE — Progress Notes (Signed)
 No charge [facility] Progress Note.  I was advised by ICM that all arrangements were in place for patient to discharge to SNF today.  I interviewed and examined patient, made necessary changes to his discharge paperwork and updated same in DC summary.  Please refer to DC summary by this MD for today for details.  I called patient's daughter via phone and updated care.  Trenda Mar, MD,  FACP, [facility], [facility], [facility]   [facility] Phys[facility] Ad[facility] [place]     To contact the attending provider between 7A-7P or the covering provider during after hours 7P-7A, please log into the web site [email or web address] and access using universal [place] password for that web site. If you do not have the password, please call the hospital operator.

## 2024-04-26 NOTE — Progress Notes (Signed)
 [facility] Progress Note   Subjective:   Appears he is discharging today, he is very happy. Denies SOB, CP, dizziness, nausea.   Objective Vitals:   04/25/24 1654 04/25/24 1958 04/26/24 0434 04/26/24 0711  BP: (!) 89/61 (!) 83/49 (!) 87/49   Pulse: 72 84 97 94  Resp:  17 17 18   Temp:  97.8 F (36.6 C) 98.1 F (36.7 C)   TempSrc:  Oral Oral   SpO2:  92% 100% 97%  Weight:      Height:       Physical Exam  General: Alert, elderly male in NAD Heart: RRR, no murmurs, rubs or gallops Lungs: CTA bilaterally, respirations unlabored Abdomen: Soft, non-distended, +BS, + ostomy Extremities: No edema b/l lower extremities Dialysis Access: AVG + t/b  Additional Objective Labs: Basic Metabolic Panel: Recent Labs  Lab 04/20/24 0807 04/23/24 0800  NA 135 132*  K 4.7 4.9  CL 96* 97*  CO2 22 18*  GLUCOSE 127* 109*  BUN 82* 95*  CREATININE 8.05* 7.77*  CALCIUM  9.1 8.8*  PHOS 5.4* 5.0*   Liver Function Tests: Recent Labs  Lab 04/20/24 0807 04/23/24 0800  ALBUMIN  3.3* 3.0*   No results for input(s): LIPASE, AMYLASE in the last 168 hours. CBC: Recent Labs  Lab 04/20/24 0807 04/23/24 0814  WBC 9.6 10.6*  NEUTROABS 7.9*  --   HGB 10.3* 9.1*  HCT 31.4* 28.3*  MCV 115.0* 114.6*  PLT 150 177   Blood Culture    Component Value Date/Time   SDES BLOOD SITE NOT SPECIFIED 04/18/2024 0606   SPECREQUEST  04/18/2024 0606    BOTTLES DRAWN AEROBIC AND ANAEROBIC Blood Culture adequate volume   CULT  04/18/2024 0606    NO GROWTH 5 DAYS Performed at [facility], [address] [place], [place] [address]    REPTSTATUS 04/23/2024 FINAL 04/18/2024 0606    Cardiac Enzymes: No results for input(s): CKTOTAL, CKMB, CKMBINDEX, TROPONINI in the last 168 hours. CBG: No results for input(s): GLUCAP in the last 168 hours. Iron Studies: No results for input(s): IRON, TIBC, TRANSFERRIN, FERRITIN in the last 72  hours. @lablastinr3 @ Studies/Results: No results found. Medications:  ertapenem  Stopped (04/25/24 1420)    apixaban   2.5 mg Oral BID   atorvastatin   20 mg Oral Daily   calcitRIOL   0.5 mcg Oral Q M,W,F-HD   Chlorhexidine  Gluconate Cloth  6 each Topical Q0600   Chlorhexidine  Gluconate Cloth  6 each Topical Q0600   Chlorhexidine  Gluconate Cloth  6 each Topical Q0600   dorzolamide   1 drop Right Eye QHS   fludrocortisone   0.1 mg Oral Daily   fluticasone  furoate-vilanterol  1 puff Inhalation Daily   hydrocortisone   10 mg Oral Q24H   hydrocortisone   20 mg Oral Q24H   liver oil-zinc oxide   Topical BID   midodrine   30 mg Oral TID   pantoprazole   40 mg Oral BID   ramelteon   8 mg Oral QHS   timolol   1 drop Both Eyes BID    Dialysis Orders: MWF NW 2h   B400  72kg   2K bath   AVG    Heparin  none   Home bp meds: Midodrine  30mg  tid  Assessment/Plan: AMS: Returned to baseline.  ESRD: on HD MWF. HD off schedule last week.  Now back on regular schedule. He limits his treatment time to 2 hr CLIP as transient at [facility] MWF while admitted to SNF.  Hyperkalemia: K+ in 5s lately,  better over the last couple days. He refuses a renal diet.  Consider Lokelma  on non-HD days as first step if worsens again. BP: chronic hypotension on high dose midodrine  30 tid and hydrocortisone  daily for adrenal failure.  Volume:  Volume removal limited by hypotension but edema appears to be improved. UF as tolerated. Under dry weight.  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 Has been in goal. Will hold off on ESA for now.  Pre sacral bscess w/ESBL producing E coli - on Ertapenem  1g IV qHD until 04/29/24.  Temporary outpatient dialysis center has to special order it as noted below.   Lucie Collet, PA-C 04/26/2024, 8:54 AM  [facility] Pager: [phone]

## 2024-04-26 NOTE — TOC Transition Note (Signed)
 Transition of Care [facility]) - Discharge Note   Patient Details  Name: Joshua Riley MRN: [id number] Date of Birth: Jan 31, 1936  Transition [facility] CM/SW Contact:  Lauraine FORBES Saa, LCSWA Phone Number: 04/26/2024, 12:06 PM   Clinical Narrative:     Patient will DC to: [facility] SNF Anticipated DC date: 04/26/24 Family notified: [name] Brooke; Daughter; [phone] Transport by: [facility]   Per MD patient ready for DC to [facility]. RN to call report prior to discharge [phone]). RN, patient, patient's family, and facility notified of DC. Discharge Summary and FL2 sent to facility. DC packet on chart. Ambulance transport requested for patient at 12:05.  CSW will sign off for now as social work intervention is no longer needed. Please consult us  again if new needs arise.    Final next level of care: Skilled Nursing Facility Barriers to Discharge: Barriers Resolved   Patient Goals and CMS Choice Patient states their goals for this hospitalization and ongoing recovery are:: SNF CMS Medicare.gov Compare Post Acute Care list provided to:: Patient Choice offered to / list presented to : Patient      Discharge Placement              Patient chooses bed at: [facility] Patient to be transferred to facility by: [facility] Name of family member notified: [name] Brooke; Daughter; [phone] Patient and family notified of of transfer: 04/26/24  Discharge Plan and Services Additional resources added to the After Visit Summary for   In-house Referral: Clinical Social Work Discharge Planning Services: CM Consult Post Acute Care Choice: Skilled Nursing Facility                               Social Drivers of Health (SDOH) Interventions SDOH Screenings   Food Insecurity: No Food Insecurity (04/18/2024)  Housing: Low Risk  (04/18/2024)  Transportation Needs: No Transportation Needs (04/18/2024)  Utilities: Not At Risk (04/18/2024)  Alcohol Screen: Low Risk   (10/26/2023)  Depression (PHQ2-9): Medium Risk (04/07/2024)  Financial Resource Strain: Low Risk  (10/26/2023)  Physical Activity: Insufficiently Active (10/26/2023)  Social Connections: Patient Declined (04/18/2024)  Recent Concern: Social Connections - Socially Isolated (01/27/2024)  Stress: Stress Concern Present (10/26/2023)  Tobacco Use: Medium Risk (04/18/2024)     Readmission Risk Interventions    12/03/2023   11:30 AM 11/10/2023    3:46 PM 07/21/2023   12:26 PM  Readmission Risk Prevention Plan  Transportation Screening Complete Complete Complete  PCP or Specialist Appt within 5-7 Days  Complete   Home Care Screening  Complete   Medication Review (RN CM)  Complete   HRI or Home Care Consult   Complete  Social Work Consult for Recovery Care Planning/Counseling   Complete  Palliative Care Screening   Not Applicable  Medication Review Oceanographer)   Complete  SW Recovery Care/Counseling Consult Complete    Palliative Care Screening Complete    Skilled Nursing Facility Complete

## 2024-04-26 NOTE — Plan of Care (Signed)

## 2024-04-26 NOTE — Discharge Summary (Signed)
 Physician Discharge Summary  Joshua Riley FMW:[id number] DOB: August 11, 1935  PCP: Katrinka Garnette KIDD, MD  Admitted from: Home Discharged to: SNF  Admit date: 04/18/2024 Discharge date: 04/26/2024  Recommendations for Outpatient Follow-up:    Contact information for follow-up providers     [facility]. Go on 04/25/2024.   Why: Please arrive for dialysis 10/20 at 10:45AM for paperwork.  (After first appointment, you will go here every Monday, Wednesday, and Friday at 11:45). Contact information: [address] [place] [address] [phone]         MD at SNF Follow up.   Why: To be in in 2 to 3 days with repeat labs (CBC & renal panel).  Also follow labs pertaining to OPAT protocol.  Patient has to follow-up outpatient with ID.        Katrinka Garnette KIDD, MD. Schedule an appointment as soon as possible for a visit.   Specialty: Family Medicine Contact information: [address] [place] [address] [phone]              Contact information for after-discharge care     Destination     [facility] .   Service: Skilled Nursing Contact information: [address] [facility] [place]  [address] [phone]                    Patient has an appointment to follow-up with Dr. Montie Bologna, ID on 05/12/2024 at 1:45 PM at [facility].  Home Health: Home Health Orders (From admission, onward)     Start     Ordered   04/19/24 1225  Home Health  At discharge       Comments: 04/19/24 0500    Wound care  Daily      Comments: Black wound noted right medial 2nd toe; with history of PAD; continue to paint with betadine daily. Allow to air dry  04/18/24 1018    04/18/24 1017    Wound care  Every other day    Comments: 1.Linear wounds on the left 2nd and 3rd toe; clean and dry; ok to apply xeroform every other day 2.         Large akin tear right arm, cleanse with saline, pat dry. Apply single layer of xeroform and  change every other day and dry topper dressing.  4.         Skin tear left elbow, skin flap present; attempt to re-aproximate skin flap and steristrip, apply single layer of xeroform gauze and dry dressing  5.         Skin tear left bicep, cleanse with saline, pat dry. Cover with single layer of xeroform and top with dry dressing or foam. Change every other day.  6.Skin tear left outer bicep; cleanse with saline, pat dry. Apply single layer of xeroform and top with dry dressing. Change every other day.  04/18/24 1018  Question Answer Comment  To provide the following care/treatments PT   To provide the following care/treatments OT   To provide the following care/treatments RN   To provide the following care/treatments Home Health Aide      04/19/24 1225             Equipment/Devices: TBD at SNF.    Discharge Condition: Improved and stable.   Code Status: Limited: Do not attempt resuscitation (DNR) -DNR-LIMITED -Do Not Intubate/DNI  Diet recommendation:  Discharge Diet Orders (From admission, onward)     Start  Ordered   04/19/24 0000  Diet renal with fluid restriction       Comments: 50 ounces per day fluid restriction   04/19/24 1220             Discharge Diagnoses:  Principal Problem:   Acute metabolic encephalopathy Active Problems:   Orthostatic hypotension   Colostomy status (HCC) -  Care Everywhere records reviewed. States pt had total colectomy performed in June 2011 in [place] .   Nephrostomy present (HCC) - left kidney. Care Everywhere records reviewed. appears to bave been ordered by Dr. Dasie Clark in [place] . placed 02-2012   End-stage renal disease on hemodialysis (HCC)   Ulcerative colitis (HCC) - S/p total colectomy. 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    Acute Encephalopathy quickly resolved and patient was essentially in the hospital awaiting SNF placement.  Patient initially had declined SNF and wanted to go home but then after discussing with family, has agreed for SNF.     Assessment & Plan:    Acute metabolic encephalopathy Etiology unclear.  Appeared temporary and quickly resolved.  Has been alert and oriented and mentating normally for several days now.   Orthostatic hypotension, chronic Chronic and intermittent.  Mostly on HD.  Remains on midodrine  30 mg 3 times daily and Florinef  0.1 mg daily.  Nephrology aware.  Mostly asymptomatic.   Bilateral leg edema, chronic Has Ace wrappings to both legs   Presacral abscess/ESBL E. coli ESBL E. coli was cultured from presacral abscess on 03/16/2024. Seen in ID clinic on 04/01/2024.  Remains on IV Invanz  with EOT date 04/29/2024 Outpatient ID clinic follow-up-has appointment as above.  Confirmed with ID last week and then on the day of discharge that patient was not supposed to be on vancomycin  PTA, received a dose on 10/14 and then was  discontinued.   Adrenal insufficiency, chronic On hydrocortisone  twice daily.  As per Dr. Laurence, [facility] MD note from 10/14, from prior DC summary in September 2025, patient was supposed to be on Florinef  0.1 mg daily but apparently was not seen on the DC summary 04/15/2024.  He was started on Florinef  0.1 mg daily this admission.   Paroxysmal atrial fibrillation Not on rate control medications due to chronic hypotension.  During recent admission, amiodarone  was discontinued due to bradycardia. Remains on apixaban .  Extensive bruising noted   Ulcerative colitis S/p total colectomy and colostomy Stable   ESRD on HD Nephrology on board, and having HD per their direction.  Last HD was on 10/20.  Discussed with nephrology on day of discharge,  they will assist in coordinating continued outpatient HD.  They have cleared patient for DC.   S/p left percutaneous nephrostomy Appears chronic and changed every 2 to 3 months   Multiple pressure injuries of skin Please refer to details in DC summary done by Dr. Camellia Door, [facility] MD on 10/14 for details-also copied below. Continue wound care.  WOC RN seen during prior admission on 10/8.   Anemia of chronic disease and ESRD Stable.     Body mass index is 23.77 kg/m.  Pressure injury of skin of multiple topographic sites 04/22/24 present on admission. Reason for Consult: LE wounds  Patient was seen by [facility] nursing team last week 10/8; has been DC and returned 10/13 for AMS   Wound type: venous ulcerations in the presence of PAD; apparently Unna's boots have been applied at some point; I did not feel comfortable placing orders last week without an ABI and known history of PAD.   Linear wounds on the left 2nd and 3rd toe; clean and dry; ok to apply xeroform every other day Black wound noted right medial 2nd toe; with history of PAD; continue to paint with betadine daily. Allow to air dry Large akin tear right arm, cleanse with saline, pat dry. Apply single  layer of xeroform and change every other day and dry topper dressing.  Skin tear left elbow, skin flap present; attempt to re-aproximate skin flap and steristrip, apply single layer of xeroform gauze and dry dressing  Skin tear left bicep, cleanse with saline, pat dry. Cover with single layer of xeroform and top with dry dressing or foam. Change every other day.  Skin tear left outer bicep; cleanse with saline, pat dry. Apply single layer of xeroform and top with dry dressing. Change every other day.  Dry lesion left shoulder blade See ostomy care below.  Last week patient had two lesions on the bilateral LEs, will have staff remove Unna's boots and verify with MD to replace.   Several pictures below from 04/18/2024 or 04/19/2024.                                      Consultants:   Nephrology   Procedures:   Hemodialysis   Discharge Instructions  Discharge Instructions     Call MD for:  difficulty breathing, headache or visual disturbances   Complete by: As directed    Call MD for:  extreme fatigue   Complete by: As directed    Call MD for:  hives   Complete by: As directed    Call MD for:  persistant dizziness or light-headedness   Complete by: As directed    Call MD for:  persistant nausea and vomiting   Complete by: As directed    Call MD for:  redness, tenderness, or signs of infection (pain, swelling, redness, odor or green/yellow discharge around incision site)   Complete by: As directed    Call MD for:  severe uncontrolled pain   Complete by: As directed    Call MD for:  temperature >100.4   Complete by: As directed    Diet renal with fluid restriction   Complete by: As directed    50 ounces per day fluid restriction   Discharge instructions   Complete by: As directed    1. Follow up with your primary care provider in 1-2 weeks following discharge from hospital. 2. Follow up routine dialysis  on Monday, Wednesday, Friday.   Discharge  wound care:   Complete by: As directed    04/19/24 0500    Wound care  Daily      Comments: Black wound noted right medial 2nd toe; with history of PAD; continue to paint with betadine daily. Allow to air dry  Wound care  Every other day    Comments: 1.Linear wounds on the left 2nd and 3rd toe; clean and dry; ok to apply xeroform every other day 2.         Large akin tear right arm, cleanse with saline, pat dry. Apply single layer of xeroform and change every other day and dry topper dressing.  4.         Skin tear left elbow, skin flap present; attempt to re-aproximate skin flap and steristrip, apply single layer of xeroform gauze and dry dressing  5.         Skin tear left bicep, cleanse with saline, pat dry. Cover with single layer of xeroform and top with dry dressing or foam. Change every other day.  6.Skin tear left outer bicep; cleanse with saline, pat dry. Apply single layer of xeroform and top with dry dressing. Change every other day.   Discharge wound care:   Complete by: As directed    See above   Increase activity slowly   Complete by: As directed         Medication List     TAKE these medications    atorvastatin  20 MG tablet Commonly known as: LIPITOR Take 1 tablet (20 mg total) by mouth daily.   calcitRIOL  0.5 MCG capsule Commonly known as: ROCALTROL  Take 1 capsule (0.5 mcg total) by mouth every Monday, Wednesday, and Friday with hemodialysis.   dorzolamide  2 % ophthalmic solution Commonly known as: TRUSOPT  Place 1 drop into the right eye at bedtime.   Eliquis  2.5 MG Tabs tablet Generic drug: apixaban  Take 1 tablet (2.5 mg total) by mouth 2 (two) times daily.   ertapenem  1 g in sodium chloride  0.9 % 100 mL Inject 1 g into the vein every Monday, Wednesday, and Friday at 6 PM. End date: 04/29/2024. Start taking on: April 27, 2024 What changed: additional instructions   fludrocortisone  0.1 MG tablet Commonly known as: FLORINEF  Take 1 tablet (0.1 mg total)  by mouth daily.   folic acid  1 MG tablet Commonly known as: FOLVITE  Take 1 tablet (1 mg total) by mouth daily.   gabapentin  300 MG capsule Commonly known as: NEURONTIN  TAKE 1 CAPSULE BY MOUTH ONCE A DAY AT BEDTIME FOR NEUROPATHY   hydrocortisone  10 MG tablet Commonly known as: CORTEF  Take 1 tablet (10 mg total) by mouth every evening.   hydrocortisone  20 MG tablet Commonly known as: CORTEF  Take 1 tablet (20 mg total) by mouth daily.   midodrine  10 MG tablet Commonly known as: PROAMATINE  Take 3 tablets (30 mg total) by mouth 3 (three) times daily.   mometasone -formoterol  200-5 MCG/ACT Aero Commonly known as: DULERA  Inhale 2 puffs into the lungs 2 (two) times daily.   multivitamin Tabs tablet Take 1 tablet by mouth daily.   Omega-3 Gummies Chew Chew 1,200 mg by mouth in the morning and at bedtime.   pantoprazole  40 MG tablet Commonly known as: PROTONIX  Take 1 tablet (40 mg total) by mouth 2 (two) times daily. What changed:  when to take this reasons to take this   ramelteon  8 MG tablet Commonly known as: ROZEREM  Take 1 tablet (8 mg  total) by mouth at bedtime.   Saline Flush 0.9 % Soln Use 5 mLs by Intracatheter route daily as directed.   timolol  0.5 % ophthalmic solution Commonly known as: TIMOPTIC  Place 1 drop into both eyes in the morning and at bedtime.   traMADol  50 MG tablet Commonly known as: ULTRAM  Take 1 tablet (50 mg total) by mouth every 12 (twelve) hours as needed for moderate pain (pain score 4-6) or severe pain (pain score 7-10). What changed: reasons to take this   Tylenol  325 MG tablet Generic drug: acetaminophen  Take 325-650 mg by mouth every 6 (six) hours as needed for mild pain (pain score 1-3) or headache.   VITAMIN B-12 PO Take 1 capsule by mouth in the morning and at bedtime.       Allergies  Allergen Reactions   Baclofen Other (See Comments)    Altered mental status, after accidental overdose     Cephalosporins Rash    Ciprofloxacin Itching and Rash   Wound Dressing Adhesive Rash      Procedures/Studies: DG CHEST PORT 1 VIEW Result Date: 04/19/2024 EXAM: 1 VIEW(S) XRAY OF THE CHEST 04/19/2024 01:01:33 AM COMPARISON: Portable chest x-ray 04/11/2024. CLINICAL HISTORY: 201257 ESRD (end stage renal disease) (HCC) [id number]. Shob/ low Oxygen levels. FINDINGS: LINES, TUBES AND DEVICES: A right subclavian venous stent is again shown. LUNGS AND PLEURA: No focal pulmonary opacity. Slight interstitial edema in the lung bases, findings improved. There is a small right pleural effusion. Linear atelectasis in both lung bases. No focal pneumonia is seen. No pneumothorax. HEART AND MEDIASTINUM: There is mild cardiomegaly. The mediastinum is stable with mild aortic tortuosity and atherosclerosis. There is mild prominence in the central vasculature, improved. BONES AND SOFT TISSUES: There is osteopenia with thoracic spondylosis. No acute osseous abnormality. IMPRESSION: 1. Small right pleural effusion, not significantly changed . 2. Mild pulmonary interstitial edema with mild central vascular congestion, improved. 3. No focal pneumonia is evident . Bilateral basal linear atelectasis. Electronically signed by: Francis Quam MD 04/19/2024 01:13 AM EDT RP Workstation: [id number]   CT Head Wo Contrast Result Date: 04/18/2024 EXAM: CT HEAD WITHOUT CONTRAST 04/18/2024 06:34:38 AM TECHNIQUE: CT of the head was performed without the administration of intravenous contrast. Automated exposure control, iterative reconstruction, and/or weight based adjustment of the mA/kV was utilized to reduce the radiation dose to as low as reasonably achievable. COMPARISON: Brain MRI 11/29/2023, head CT 11/28/2023. CLINICAL HISTORY: 88 year old male with mental status change, unknown cause (AMS). FINDINGS: BRAIN AND VENTRICLES: No acute hemorrhage. No evidence of acute infarct. No hydrocephalus. No extra-axial collection. No mass effect or midline shift. Cerebral  volume is stable and within normal limits for age. Very mild for age chronic periventricular white matter hypodensity is stable. Calcified atherosclerosis at the skull base. No suspicious intracranial vascular hyperdensity. ORBITS: Postoperative changes to both globes. SINUSES: Mild paranasal sinus mucosal thickening. SOFT TISSUES AND SKULL: Chronic calcified scalp vessel atherosclerosis. No acute soft tissue abnormality. No skull fracture. IMPRESSION: 1. No acute intracranial abnormality. 2. Negative for age non-contrast CT appearance of the brain. Electronically signed by: Helayne Hurst MD 04/18/2024 06:47 AM EDT RP Workstation: [id number]    Subjective: Denies complaints.  Anxious to get out of the hospital.  Discharge Exam:  Vitals:   04/25/24 1958 04/26/24 0434 04/26/24 0711 04/26/24 0859  BP: (!) 83/49 (!) 87/49  (!) 85/60  Pulse: 84 97 94 84  Resp: 17 17 18 17   Temp: 97.8 F (36.6 C) 98.1 F (36.7 C)  97.6  F (36.4 C)  TempSrc: Oral Oral  Oral  SpO2: 92% 100% 97% 97%  Weight:      Height:        General exam: Elderly male, moderately built and frail, chronically ill looking, comfortably propped up in bed without distress. Respiratory system: Slightly diminished breath sounds in the bases but otherwise clear to auscultation.  Stable. Cardiovascular system: S1 & S2 heard, RRR. No JVD, murmurs, rubs, gallops or clicks. No pedal edema. Gastrointestinal system: Abdomen is nondistended, soft and nontender. No organomegaly or masses felt. Normal bowel sounds heard.  Has right-sided colostomy with formed brown stools.  No blood or melena. Central nervous system: Alert and oriented. No focal neurological deficits. Extremities: Symmetric 5 x 5 power. Skin: Extensive bruising, especially of right upper extremity, right shoulder.  Spider nevi on upper anterior chest.  Left lower back percutaneous drain. Psychiatry: Judgement and insight appear normal. Mood & affect appropriate.     The  results of significant diagnostics from this hospitalization (including imaging, microbiology, ancillary and laboratory) are listed below for reference.     Microbiology: Recent Results (from the past 240 hours)  Culture, blood (routine x 2)     Status: None   Collection Time: 04/18/24  6:01 AM   Specimen: BLOOD  Result Value Ref Range Status   Specimen Description BLOOD SITE NOT SPECIFIED  Final   Special Requests   Final    BOTTLES DRAWN AEROBIC AND ANAEROBIC Blood Culture results may not be optimal due to an inadequate volume of blood received in culture bottles   Culture   Final    NO GROWTH 5 DAYS Performed at [facility], [address] [place], [place] [address]    Report Status 04/23/2024 FINAL  Final  Resp panel by RT-PCR (RSV, Flu A&B, Covid) Anterior Nasal Swab     Status: None   Collection Time: 04/18/24  6:01 AM   Specimen: Anterior Nasal Swab  Result Value Ref Range Status   SARS Coronavirus 2 by RT PCR NEGATIVE NEGATIVE Final   Influenza A by PCR NEGATIVE NEGATIVE Final   Influenza B by PCR NEGATIVE NEGATIVE Final    Comment: (NOTE) The Xpert Xpress SARS-CoV-2/FLU/RSV plus assay is intended as an aid in the diagnosis of influenza from Nasopharyngeal swab specimens and should not be used as a sole basis for treatment. Nasal washings and aspirates are unacceptable for Xpert Xpress SARS-CoV-2/FLU/RSV testing.  Fact Sheet for Patients: BloggerCourse.com  Fact Sheet for Healthcare Providers: SeriousBroker.it  This test is not yet approved or cleared by the [place]  FDA and has been authorized for detection and/or diagnosis of SARS-CoV-2 by FDA under an Emergency Use Authorization (EUA). This EUA will remain in effect (meaning this test can be used) for the duration of the COVID-19 declaration under Section 564(b)(1) of the Act, 21 U.S.C. section 360bbb-3(b)(1), unless the authorization is terminated  or revoked.     Resp Syncytial Virus by PCR NEGATIVE NEGATIVE Final    Comment: (NOTE) Fact Sheet for Patients: BloggerCourse.com  Fact Sheet for Healthcare Providers: SeriousBroker.it  This test is not yet approved or cleared by the [place]  FDA and has been authorized for detection and/or diagnosis of SARS-CoV-2 by FDA under an Emergency Use Authorization (EUA). This EUA will remain in effect (meaning this test can be used) for the duration of the COVID-19 declaration under Section 564(b)(1) of the Act, 21 U.S.C. section 360bbb-3(b)(1), unless the authorization is terminated or revoked.  Performed at  [facility], [address], [place], [place] [address]   Culture, blood (routine x 2)     Status: None   Collection Time: 04/18/24  6:06 AM   Specimen: BLOOD  Result Value Ref Range Status   Specimen Description BLOOD SITE NOT SPECIFIED  Final   Special Requests   Final    BOTTLES DRAWN AEROBIC AND ANAEROBIC Blood Culture adequate volume   Culture   Final    NO GROWTH 5 DAYS Performed at [facility], [address] [place], [place] [address]    Report Status 04/23/2024 FINAL  Final     Labs: CBC: Recent Labs  Lab 04/20/24 0807 04/23/24 0814  WBC 9.6 10.6*  NEUTROABS 7.9*  --   HGB 10.3* 9.1*  HCT 31.4* 28.3*  MCV 115.0* 114.6*  PLT 150 177    Basic Metabolic Panel: Recent Labs  Lab 04/20/24 0807 04/23/24 0800  NA 135 132*  K 4.7 4.9  CL 96* 97*  CO2 22 18*  GLUCOSE 127* 109*  BUN 82* 95*  CREATININE 8.05* 7.77*  CALCIUM  9.1 8.8*  PHOS 5.4* 5.0*    Liver Function Tests: Recent Labs  Lab 04/20/24 0807 04/23/24 0800  ALBUMIN  3.3* 3.0*    I discussed in detail with patient's daughter via phone, updated care and answered all questions.  Reminded her about the upcoming ID appointment.  She verbalized understanding.  Time coordinating discharge: 35 minutes  SIGNED:  Trenda Mar, MD,  FACP, [employer], [employer], [facility]   [facility] [place]     To contact the attending provider between 7A-7P or the covering provider during after hours 7P-7A, please log into the web site [email or web address] and access using universal [place] password for that web site. If you do not have the password, please call the hospital operator.

## 2024-04-27 ENCOUNTER — Other Ambulatory Visit

## 2024-05-02 ENCOUNTER — Other Ambulatory Visit: Payer: Self-pay | Admitting: Family Medicine

## 2024-05-02 ENCOUNTER — Telehealth: Payer: Self-pay | Admitting: Family Medicine

## 2024-05-02 ENCOUNTER — Ambulatory Visit: Admitting: Internal Medicine

## 2024-05-02 DIAGNOSIS — Z933 Colostomy status: Secondary | ICD-10-CM

## 2024-05-02 DIAGNOSIS — K51919 Ulcerative colitis, unspecified with unspecified complications: Secondary | ICD-10-CM

## 2024-05-02 IMAGING — XA IR EXCHANGE NEPHROSTOMY LEFT
1 series · 2 of 2 positions shown · non-contrast
Comparison: CT [facility], 11/17/2021.

INDICATION: Routine exchange.

Briefly, 85 y.o. year old male who is a poor historian and comorbid
significantly for ESRD on MWF HD, Hx of IBD (UC) s/p multiple
abdominal surgeries with reported ureteral complication and L PCN
placement in [place].
EXAM:
FLUOROSCOPIC GUIDED LEFT SIDED NEPHROSTOMY CATHETER EXCHANGE
TECHNIQUE: Informed written consent was obtained from the patient after a
discussion of the risks, benefits and alternatives to treatment.
Questions regarding the procedure were encouraged and answered. A
timeout was performed prior to the initiation of the procedure.

[2d screen save: ir nephrostomy exchange  · 2 of 2 slices shown]
[im 1/2]
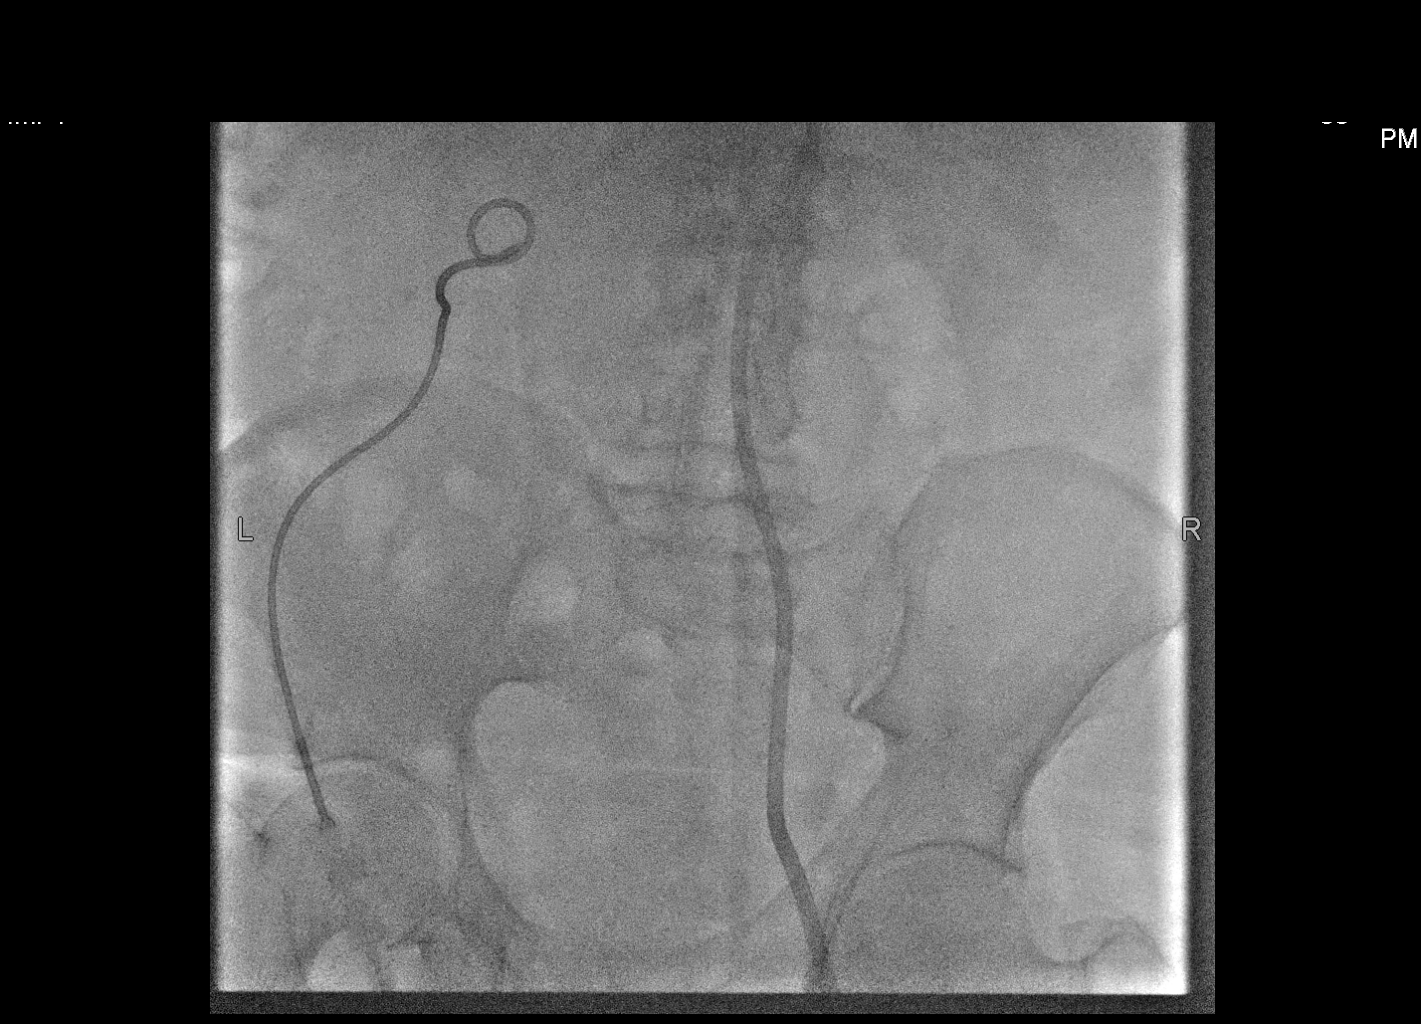
[im 2/2]
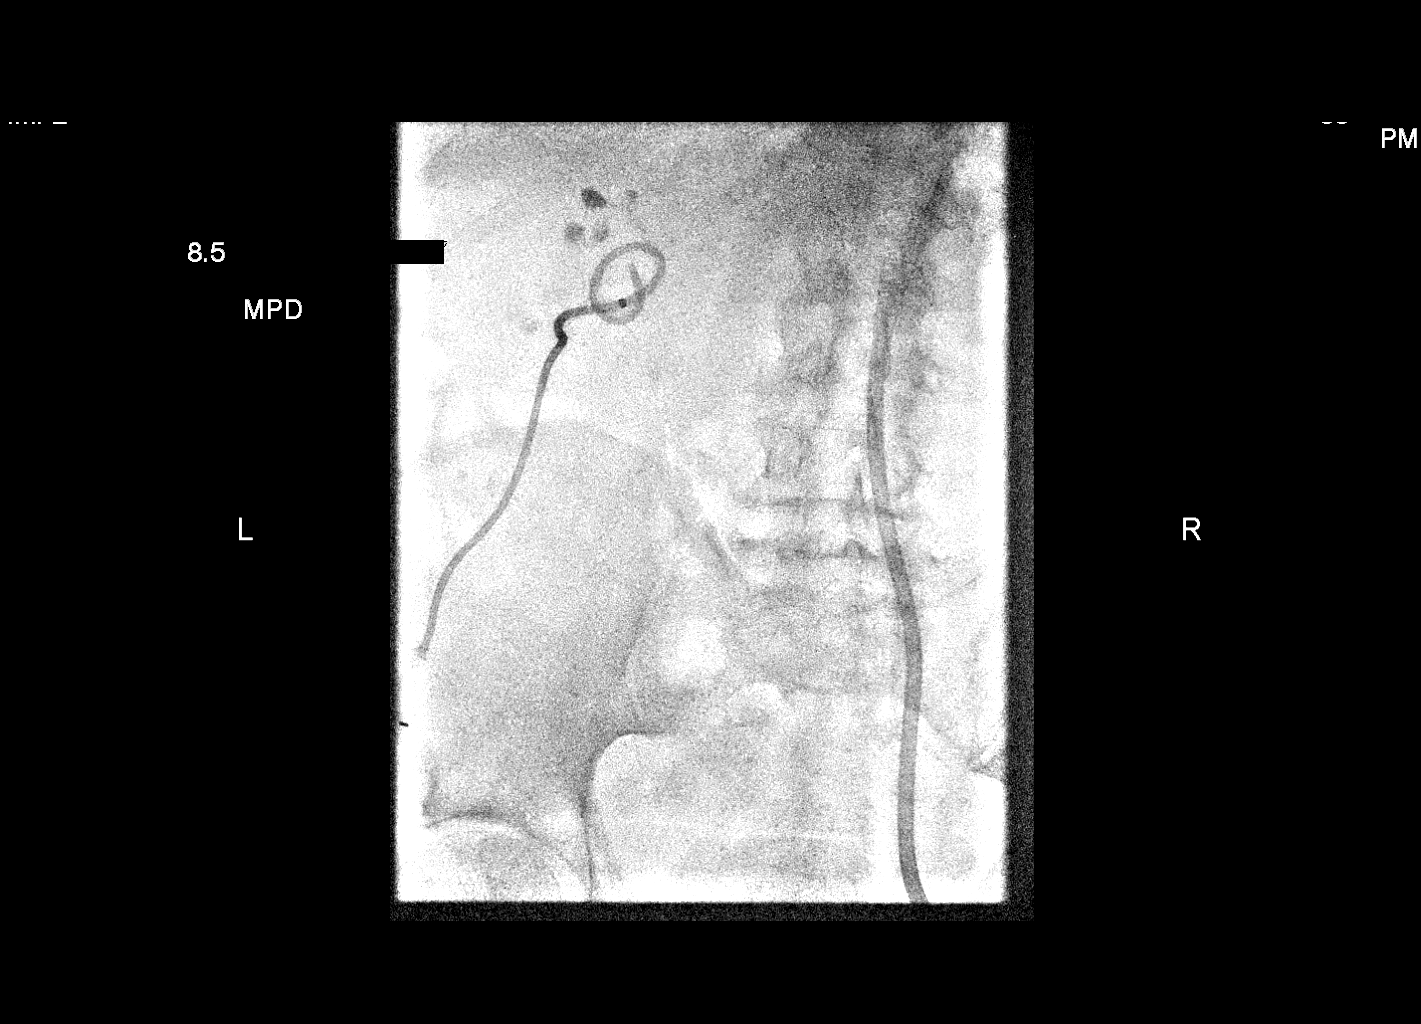

[2 of 2 positions shown; findings below may reference images not displayed]

CONTRAST:  12 mL [id number] administered into the collecting system

FLUOROSCOPY TIME:  Fluoroscopic dose; 2 mGy

COMPLICATIONS:
None immediate.
The LEFT flank and external portion of existing nephrostomy catheter
were prepped and draped in the usual sterile fashion. A sterile
drape was applied covering the operative field. Maximum barrier
sterile technique with sterile gowns and gloves were used for the
procedure. A timeout was performed prior to the initiation of the
procedure.

A pre procedural spot fluoroscopic image was obtained after contrast
was injected via the existing nephrostomy catheter demonstrating
appropriate positioning within the renal pelvis. The existing
nephrostomy catheter was cut and cannulated with a Benson wire which
was coiled within the renal pelvis. Under intermittent fluoroscopic
guidance, the existing nephrostomy catheter was exchanged for a new
8.5 Fr catheter. Contrast injection confirmed appropriate
positioning within the renal pelvis and a post exchange fluoroscopic
image was obtained. The catheter was locked patent. The patient
declined a securing skin suture. A dressing was placed. The patient
tolerated the procedure well without immediate postprocedural
complication.
FINDINGS: The existing nephrostomy catheter is appropriately positioned and
functioning.

After successful fluoroscopic guided exchange, the new nephrostomy
catheter is coiled and locked within the LEFT renal pelvis.
IMPRESSION: Successful routine exchange of LEFT sided 8.5 Fr nephrostomy
catheter, as above.

PLAN:
The patient will return to [REDACTED] ([facility])
for routine nephrostomy catheter exchange in 8-12 weeks.

## 2024-05-02 NOTE — Telephone Encounter (Signed)
 Agree with referral - hoping this processes

## 2024-05-02 NOTE — Telephone Encounter (Signed)
 Referral sent to ostomy clinic   Copied from CRM [id number]. Topic: Referral - Request for Referral >> May 02, 2024  3:19 PM Ashley R wrote: Did the patient discuss referral with their provider in the last year? Yes  Appointment offered? No  Type of order/referral and detailed reason for visit: Ostomy Nurse  Preference of office, provider, location: Within [facility] [phone]  If referral order, have you been seen by this specialty before? No (If Yes, this issue or another issue? When? Where?  Can we respond through MyChart? Yes - callback at [phone]

## 2024-05-04 ENCOUNTER — Other Ambulatory Visit

## 2024-05-05 ENCOUNTER — Other Ambulatory Visit: Payer: Self-pay | Admitting: General Surgery

## 2024-05-05 ENCOUNTER — Other Ambulatory Visit

## 2024-05-05 ENCOUNTER — Inpatient Hospital Stay
Admission: RE | Admit: 2024-05-05 | Discharge: 2024-05-05 | Disposition: A | Source: Ambulatory Visit | Attending: General Surgery | Admitting: General Surgery

## 2024-05-05 ENCOUNTER — Ambulatory Visit
Admission: RE | Admit: 2024-05-05 | Discharge: 2024-05-05 | Disposition: A | Source: Ambulatory Visit | Attending: General Surgery | Admitting: General Surgery

## 2024-05-05 ENCOUNTER — Ambulatory Visit
Admission: RE | Admit: 2024-05-05 | Discharge: 2024-05-05 | Disposition: A | Discharge status: Home / Self Care | Source: Ambulatory Visit | Attending: General Surgery | Admitting: General Surgery

## 2024-05-05 DIAGNOSIS — K651 Peritoneal abscess: Secondary | ICD-10-CM

## 2024-05-05 DIAGNOSIS — L0291 Cutaneous abscess, unspecified: Secondary | ICD-10-CM

## 2024-05-05 DIAGNOSIS — Z936 Other artificial openings of urinary tract status: Secondary | ICD-10-CM

## 2024-05-05 MED ORDER — IOPAMIDOL (ISOVUE-300) INJECTION 61%: 100.0000 mL | Freq: Once | INTRAVENOUS | Status: DC | PRN

## 2024-05-05 MED ORDER — LIDOCAINE HCL (PF) 1 % IJ SOLN
2.0000 mL | Freq: Once | INTRAMUSCULAR | Status: AC
  Administered 2024-05-05: 2 mL via INTRADERMAL

## 2024-05-05 NOTE — Progress Notes (Signed)
 Chief Complaint: Patient was seen in consultation today for a presacral drain and left nephrostomy tube at the request of Ramirez,Armando  Referring Physician(s): Ramirez,Armando  History of Present Illness: Joshua Riley is a 88 y.o. male with a chronic left percutaneous nephrostomy tube, last exchanged on 02/19/24 and need for multiple prior presacral drainage catheters for a chronic presacral abscess presenting with purulent rectal drainage after prior colectomy for ulcerative colitis. The last drain was placed on 03/16/2024. He denies any current rectal drainage, pain or fever. He remains on dialysis with some urine output still from his left nephrostomy tube.  Past Medical History:  Diagnosis Date   A-fib (HCC)    Abscess of groin, left 07/07/2023   Anemia    Arthritis    Cancer (HCC)    Basal cell   COVID-19    2021   Dysrhythmia    Afib-controlled on eliquis    ESRD (end stage renal disease) (HCC) 10/22/2021   Glaucoma 11/18/2021   History of DVT (deep vein thrombosis)    History of small bowel obstruction 02/11/2024   Hydronephrosis    managed wtih a PCN   Idiopathic neuropathy 10/22/2021   lyrica     Ileostomy in place [facility])    Obstructive uropathy    With chronic left nephrostomy   Old retinal detachment, total or subtotal    Orthostatic hypotension 10/22/2021   Seizure like activity 11/28/2023   Sleep apnea    does not need a machine   Small bowel obstruction (HCC) 01/25/2024   Stroke (HCC)    Ulcerative colitis (HCC)    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    Past Surgical History:  Procedure Laterality Date   BASAL CELL CARCINOMA EXCISION     10/23   COLON SURGERY     creation of j pouch     and subsequent takedown of j pouch   EYE SURGERY     IR CATHETER TUBE CHANGE  12/18/2023   IR CATHETER TUBE CHANGE  02/19/2024   IR CATHETER TUBE CHANGE  02/19/2024   IR CATHETER TUBE CHANGE  05/05/2024   IR NEPHROSTOMY EXCHANGE LEFT   12/10/2021   IR NEPHROSTOMY EXCHANGE LEFT  04/22/2022   IR NEPHROSTOMY EXCHANGE LEFT  07/29/2022   IR NEPHROSTOMY EXCHANGE LEFT  10/28/2022   IR NEPHROSTOMY EXCHANGE LEFT  02/05/2023   IR NEPHROSTOMY EXCHANGE LEFT  05/07/2023   IR NEPHROSTOMY EXCHANGE LEFT  06/25/2023   IR NEPHROSTOMY EXCHANGE LEFT  07/17/2023   IR NEPHROSTOMY EXCHANGE LEFT  10/22/2023   IR RADIOLOGIST EVAL & MGMT  11/25/2023   IR RADIOLOGIST EVAL & MGMT  12/18/2023   IR RADIOLOGIST EVAL & MGMT  02/19/2024   IR RADIOLOGIST EVAL & MGMT  04/04/2024   IR THROMBECTOMY AV FISTULA W/THROMBOLYSIS/PTA INC/SHUNT/IMG LEFT Left 02/23/2024   IR US  GUIDE VASC ACCESS LEFT  02/23/2024   LEFT HEART CATH AND CORONARY ANGIOGRAPHY N/A 07/22/2022   Procedure: LEFT HEART CATH AND CORONARY ANGIOGRAPHY;  Surgeon: Jordan, Peter M, MD;  Location: [facility] INVASIVE CV LAB;  Service: Cardiovascular;  Laterality: N/A;   REVISION OF ARTERIOVENOUS GORETEX GRAFT Left 05/06/2022   Procedure: REDO LEFT THIGH ARTERIOVENOUS 4-7 MM GORETEX GRAFT;  Surgeon: Eliza Lonni RAMAN, MD;  Location: [facility] OR;  Service: Vascular;  Laterality: Left;   SMALL INTESTINE SURGERY     TOTAL COLECTOMY      Allergies: Baclofen, Cephalosporins, Ciprofloxacin, and Wound dressing adhesive  Medications: Prior to Admission medications  Medication Sig Start Date End Date Taking? Authorizing Provider  atorvastatin  (LIPITOR) 20 MG tablet Take 1 tablet (20 mg total) by mouth daily. 11/14/23   Sebastian Toribio GAILS, MD  calcitRIOL  (ROCALTROL ) 0.5 MCG capsule Take 1 capsule (0.5 mcg total) by mouth every Monday, Wednesday, and Friday with hemodialysis. 12/04/23   Danford, Lonni SQUIBB, MD  Cyanocobalamin  (VITAMIN B-12 PO) Take 1 capsule by mouth in the morning and at bedtime.    [provider]  dorzolamide  (TRUSOPT ) 2 % ophthalmic solution Place 1 drop into the right eye at bedtime. 06/17/23   [provider]  ELIQUIS  2.5 MG TABS tablet Take 1 tablet (2.5 mg total) by mouth 2 (two)  times daily. 02/17/24   Katrinka Garnette KIDD, MD  ertapenem  1 g in sodium chloride  0.9 % 100 mL Inject 1 g into the vein every Monday, Wednesday, and Friday at 6 PM. End date: 04/29/2024. 04/27/24   Judeth Trenda BIRCH, MD  fludrocortisone  (FLORINEF ) 0.1 MG tablet Take 1 tablet (0.1 mg total) by mouth daily. 04/19/24 07/18/24  Laurence Locus, DO  folic acid  (FOLVITE ) 1 MG tablet Take 1 tablet (1 mg total) by mouth daily. 07/21/23   Regalado, Belkys A, MD  gabapentin  (NEURONTIN ) 300 MG capsule TAKE 1 CAPSULE BY MOUTH ONCE A DAY AT BEDTIME FOR NEUROPATHY 03/21/24   Katrinka Garnette KIDD, MD  hydrocortisone  (CORTEF ) 10 MG tablet Take 1 tablet (10 mg total) by mouth every evening. 04/15/24   Samtani, Jai-Gurmukh, MD  hydrocortisone  (CORTEF ) 20 MG tablet Take 1 tablet (20 mg total) by mouth daily. 04/15/24   Samtani, Jai-Gurmukh, MD  midodrine  (PROAMATINE ) 10 MG tablet Take 3 tablets (30 mg total) by mouth 3 (three) times daily. 04/15/24   Samtani, Jai-Gurmukh, MD  mometasone -formoterol  (DULERA ) 200-5 MCG/ACT AERO Inhale 2 puffs into the lungs 2 (two) times daily. 04/07/24   Katrinka Garnette KIDD, MD  multivitamin (RENA-VIT) TABS tablet Take 1 tablet by mouth daily.    [provider]  Omega Fatty Acids-Vitamins (OMEGA-3 GUMMIES) CHEW Chew 1,200 mg by mouth in the morning and at bedtime.    [provider]  pantoprazole  (PROTONIX ) 40 MG tablet Take 1 tablet (40 mg total) by mouth 2 (two) times daily. Patient taking differently: Take 40 mg by mouth 2 (two) times daily as needed (for acid reflux). 11/13/23   Sebastian Toribio GAILS, MD  ramelteon  (ROZEREM ) 8 MG tablet Take 1 tablet (8 mg total) by mouth at bedtime. 10/27/23   Katrinka Garnette KIDD, MD  Sodium Chloride  Flush (SALINE FLUSH) 0.9 % SOLN Use 5 mLs by Intracatheter route daily as directed. 11/25/23   Caperilla, Marissa N, PA  timolol  (TIMOPTIC ) 0.5 % ophthalmic solution Place 1 drop into both eyes in the morning and at bedtime.    [provider]   traMADol  (ULTRAM ) 50 MG tablet Take 1 tablet (50 mg total) by mouth every 12 (twelve) hours as needed for moderate pain (pain score 4-6) or severe pain (pain score 7-10). 04/25/24   Hongalgi, Anand D, MD  TYLENOL  325 MG tablet Take 325-650 mg by mouth every 6 (six) hours as needed for mild pain (pain score 1-3) or headache.    [provider]     Family History  Problem Relation Age of Onset   Stroke Mother    Cancer Father    Esophageal cancer Brother     Social History   Socioeconomic History   Marital status: Widowed    Spouse name: Not on file  Number of children: Not on file   Years of education: Not on file   Highest education level: 12th grade  Occupational History   Not on file  Tobacco Use   Smoking status: Former    Current packs/day: 0.00    Average packs/day: 2.0 packs/day for 6.0 years (12.0 ttl pk-yrs)    Types: Cigarettes    Start date: 34    Quit date: 72    Years since quitting: 40.8    Passive exposure: Never   Smokeless tobacco: Never  Vaping Use   Vaping status: Never Used  Substance and Sexual Activity   Alcohol use: Yes    Alcohol/week: 5.0 standard drinks of alcohol    Types: 5 Shots of liquor per week    Comment: socially   Drug use: Never   Sexual activity: Not Currently  Other Topics Concern   Not on file  Social History Narrative   Widowed- lost wife of 13 years to pancreatic cancer- previously married 43 years. Son in [place] and daughter helping in [place]. 4 grandkids.       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       Hobbies: dinner with family- occasional martini   Social Drivers of Corporate Investment Banker Strain: Low Risk  (10/26/2023)   Overall Financial Resource Strain (CARDIA)    Difficulty of Paying Living Expenses: Not hard at all  Food Insecurity: No Food Insecurity (04/18/2024)   Hunger Vital Sign    Worried About Running Out of Food in the Last Year: Never true    Ran Out of Food in  the Last Year: Never true  Transportation Needs: No Transportation Needs (04/18/2024)   PRAPARE - Administrator, Civil Service (Medical): No    Lack of Transportation (Non-Medical): No  Physical Activity: Insufficiently Active (10/26/2023)   Exercise Vital Sign    Days of Exercise per Week: 1 day    Minutes of Exercise per Session: 10 min  Stress: Stress Concern Present (10/26/2023)   [employer] of Occupational Health - Occupational Stress Questionnaire    Feeling of Stress : To some extent  Social Connections: Patient Declined (04/18/2024)   Social Connection and Isolation Panel    Frequency of Communication with Friends and Family: Patient declined    Frequency of Social Gatherings with Friends and Family: Patient declined    Attends Religious Services: Patient declined    Database Administrator or Organizations: Patient declined    Attends Banker Meetings: Patient declined    Marital Status: Patient declined  Recent Concern: Social Connections - Socially Isolated (01/27/2024)   Social Connection and Isolation Panel    Frequency of Communication with Friends and Family: More than three times a week    Frequency of Social Gatherings with Friends and Family: More than three times a week    Attends Religious Services: Never    Database Administrator or Organizations: No    Attends Banker Meetings: Never    Marital Status: Widowed     Review of Systems: A 12 point ROS discussed and pertinent positives are indicated in the HPI above.  All other systems are negative.  Review of Systems  Vital Signs: There were no vitals taken for this visit.   Physical Exam Constitutional:      General: He is not in acute distress.    Appearance: Normal appearance. He is not ill-appearing, toxic-appearing or  diaphoretic.  Abdominal:     General: There is no distension.     Palpations: Abdomen is soft. There is no mass.     Tenderness: There is no  abdominal tenderness. There is no guarding or rebound.     Comments: Pelvic presacral drain is completely out of the body and lying on the patient's buttocks. The drain exit site is essentially closed with small opening present extending for a few mm below skin surface. No drainage from the exit site.  Genitourinary:    Comments: Left nephrostomy site clean and dry with exiting nephrostomy tube present. Neurological:     Mental Status: He is alert.      Imaging: CT ABDOMEN PELVIS WO CONTRAST Result Date: 05/05/2024 CLINICAL DATA:  Status post prior colectomy for ulcerative colitis with history of chronic presacral abscess requiring percutaneous drain. Most recent drain placement was on 03/16/2024. EXAM: CT ABDOMEN AND PELVIS WITHOUT CONTRAST TECHNIQUE: Multidetector CT imaging of the abdomen and pelvis was performed following the standard protocol without IV contrast. RADIATION DOSE REDUCTION: This exam was performed according to the departmental dose-optimization program which includes automated exposure control, adjustment of the mA and/or kV according to patient size and/or use of iterative reconstruction technique. COMPARISON:  03/14/2024, 04/11/2024 FINDINGS: Lower chest: Reduction in small right pleural effusion since prior CT on 04/11/2024. Hepatobiliary: Stable gallstone. No biliary dilatation. Unremarkable appearance of liver. Small amount of perihepatic fluid. Pancreas: Unremarkable. No pancreatic ductal dilatation or surrounding inflammatory changes. Spleen: Normal in size without focal abnormality. Adrenals/Urinary Tract: Stable severely atrophic kidneys bilaterally. Stable positioning of left percutaneous nephrostomy tube in the left renal pelvis. No evidence of hydronephrosis. The bladder is unremarkable. No adrenal masses. Stomach/Bowel: No evidence of bowel obstruction, ileus or free air. Stable appearance of right lower quadrant ileostomy. Vascular/Lymphatic: Stable atherosclerosis of  the abdominal aorta without aneurysm. No lymphadenopathy identified. Reproductive: Prostate is unremarkable. Stable appearance of pelvic reservoir for penile prosthesis. Stable partially visualized left groin dialysis graft. Other: The lower presacral drainage catheter has been inadvertently pulled out and lies on the patient's skin surface with the pigtail portion in the midline. No evidence of significant recurrent presacral fluid collection with no fluid attenuation noted. Chronic triangular shaped inflammatory tissue is present in the presacral space with no air present. Musculoskeletal: No acute or significant osseous findings. IMPRESSION: 1. The lower presacral drainage catheter has been pulled out and lies on the patient's skin surface with the pigtail portion in the midline. No evidence of significant recurrent presacral fluid collection with no fluid attenuation noted. Chronic triangular shaped inflammatory tissue is present in the presacral space with no air present. 2. Reduction in small right pleural effusion. 3. Stable cholelithiasis. 4. Stable positioning of left percutaneous nephrostomy tube in the left renal pelvis. No evidence of hydronephrosis. 5. Stable appearance of right lower quadrant ileostomy. 6. Aortic atherosclerosis. Electronically Signed   By: Marcey Moan M.D.   On: 05/05/2024 15:02   IR Catheter Tube Change Result Date: 05/05/2024 INDICATION: Left for ostomy tube change requiring routine exchange. Last exchange performed on 02/19/2024. EXAM: LEFT NEPHROSTOMY TUBE EXCHANGE UNDER FLUOROSCOPY COMPARISON:  02/19/2024 MEDICATIONS: None ANESTHESIA/SEDATION: None CONTRAST:  10 mL Isovue -300-administered into the collecting system(s) FLUOROSCOPY: Radiation Exposure Index (as provided by the fluoroscopic device): 4.8 mGy Kerma COMPLICATIONS: None immediate. PROCEDURE: Informed written consent was obtained from the patient after a thorough discussion of the procedural risks, benefits and  alternatives. All questions were addressed. Maximal Sterile Barrier Technique was utilized including  caps, mask, sterile gowns, sterile gloves, sterile drape, hand hygiene and skin antiseptic. A timeout was performed prior to the initiation of the procedure. A pre-existing 10 French left-sided nephrostomy tube was injected with contrast material under fluoroscopy, cut and removed over a guidewire. A new 10 French catheter was advanced over the wire and formed at the level of the renal pelvis. Contrast was injected and a fluoroscopic spot image obtained to confirm catheter position. The catheter was secured at the catheter exit site with a Prolene retention suture and StatLock device. The catheter was attached to a new gravity drainage bag. IMPRESSION: Exchange of 10 French left-sided percutaneous nephrostomy tube under fluoroscopy. Electronically Signed   By: Marcey Moan M.D.   On: 05/05/2024 14:53   DG CHEST PORT 1 VIEW Result Date: 04/19/2024 EXAM: 1 VIEW(S) XRAY OF THE CHEST 04/19/2024 01:01:33 AM COMPARISON: Portable chest x-ray 04/11/2024. CLINICAL HISTORY: 201257 ESRD (end stage renal disease) (HCC) [id number]. Shob/ low Oxygen levels. FINDINGS: LINES, TUBES AND DEVICES: A right subclavian venous stent is again shown. LUNGS AND PLEURA: No focal pulmonary opacity. Slight interstitial edema in the lung bases, findings improved. There is a small right pleural effusion. Linear atelectasis in both lung bases. No focal pneumonia is seen. No pneumothorax. HEART AND MEDIASTINUM: There is mild cardiomegaly. The mediastinum is stable with mild aortic tortuosity and atherosclerosis. There is mild prominence in the central vasculature, improved. BONES AND SOFT TISSUES: There is osteopenia with thoracic spondylosis. No acute osseous abnormality. IMPRESSION: 1. Small right pleural effusion, not significantly changed . 2. Mild pulmonary interstitial edema with mild central vascular congestion, improved. 3. No focal  pneumonia is evident . Bilateral basal linear atelectasis. Electronically signed by: Francis Quam MD 04/19/2024 01:13 AM EDT RP Workstation: [id number]   CT Head Wo Contrast Result Date: 04/18/2024 EXAM: CT HEAD WITHOUT CONTRAST 04/18/2024 06:34:38 AM TECHNIQUE: CT of the head was performed without the administration of intravenous contrast. Automated exposure control, iterative reconstruction, and/or weight based adjustment of the mA/kV was utilized to reduce the radiation dose to as low as reasonably achievable. COMPARISON: Brain MRI 11/29/2023, head CT 11/28/2023. CLINICAL HISTORY: 88 year old male with mental status change, unknown cause (AMS). FINDINGS: BRAIN AND VENTRICLES: No acute hemorrhage. No evidence of acute infarct. No hydrocephalus. No extra-axial collection. No mass effect or midline shift. Cerebral volume is stable and within normal limits for age. Very mild for age chronic periventricular white matter hypodensity is stable. Calcified atherosclerosis at the skull base. No suspicious intracranial vascular hyperdensity. ORBITS: Postoperative changes to both globes. SINUSES: Mild paranasal sinus mucosal thickening. SOFT TISSUES AND SKULL: Chronic calcified scalp vessel atherosclerosis. No acute soft tissue abnormality. No skull fracture. IMPRESSION: 1. No acute intracranial abnormality. 2. Negative for age non-contrast CT appearance of the brain. Electronically signed by: Helayne Hurst MD 04/18/2024 06:47 AM EDT RP Workstation: [id number]   US  Venous Img Lower Bilateral Result Date: 04/12/2024 CLINICAL DATA:  Bilateral leg swelling EXAM: BILATERAL LOWER EXTREMITY VENOUS DOPPLER ULTRASOUND TECHNIQUE: Gray-scale sonography with graded compression, as well as color Doppler and duplex ultrasound were performed to evaluate the lower extremity deep venous systems from the level of the common femoral vein and including the common femoral, femoral, profunda femoral, popliteal and calf veins including the  posterior tibial, peroneal and gastrocnemius veins when visible. The superficial great saphenous vein was also interrogated. Spectral Doppler was utilized to evaluate flow at rest and with distal augmentation maneuvers in the common femoral, femoral and popliteal veins. COMPARISON:  None Available.  FINDINGS: RIGHT LOWER EXTREMITY Common Femoral Vein: No evidence of thrombus. Normal compressibility, respiratory phasicity and response to augmentation. Saphenofemoral Junction: No evidence of thrombus. Normal compressibility and flow on color Doppler imaging. Profunda Femoral Vein: No evidence of thrombus. Normal compressibility and flow on color Doppler imaging. Femoral Vein: No evidence of thrombus. Normal compressibility, respiratory phasicity and response to augmentation. Popliteal Vein: No evidence of thrombus. Normal compressibility, respiratory phasicity and response to augmentation. Calf Veins: No evidence of thrombus. Normal compressibility and flow on color Doppler imaging. Superficial Great Saphenous Vein: No evidence of thrombus. Normal compressibility and flow on color Doppler imaging. Venous Reflux:  None. Other Findings:  Mild edema is noted. LEFT LOWER EXTREMITY Common Femoral Vein: No evidence of thrombus. Normal compressibility, respiratory phasicity and response to augmentation. Saphenofemoral Junction: No evidence of thrombus. Normal compressibility and flow on color Doppler imaging. Profunda Femoral Vein: No evidence of thrombus. Normal compressibility and flow on color Doppler imaging. Femoral Vein: No evidence of thrombus. Normal compressibility, respiratory phasicity and response to augmentation. Popliteal Vein: No evidence of thrombus. Normal compressibility, respiratory phasicity and response to augmentation. Calf Veins: No evidence of thrombus. Normal compressibility and flow on color Doppler imaging. Superficial Great Saphenous Vein: No evidence of thrombus. Normal compressibility and flow on  color Doppler imaging. Venous Reflux:  None. Other Findings:  Mild edema is noted. IMPRESSION: No evidence of deep venous thrombosis. Electronically Signed   By: Oneil Devonshire M.D.   On: 04/12/2024 00:55   CT ABDOMEN PELVIS WO CONTRAST Result Date: 04/11/2024 CLINICAL DATA:  Recent fall and findings suspicious for sepsis EXAM: CT ABDOMEN AND PELVIS WITHOUT CONTRAST TECHNIQUE: Multidetector CT imaging of the abdomen and pelvis was performed following the standard protocol without IV contrast. RADIATION DOSE REDUCTION: This exam was performed according to the departmental dose-optimization program which includes automated exposure control, adjustment of the mA and/or kV according to patient size and/or use of iterative reconstruction technique. COMPARISON:  04/04/2024 FINDINGS: Lower chest: Large right-sided pleural effusion is again noted. Vascular congestion is again seen. Hepatobiliary: Cholelithiasis without complicating factors. The liver is within normal limits. Pancreas: Unremarkable. No pancreatic ductal dilatation or surrounding inflammatory changes. Spleen: Normal in size without focal abnormality. Adrenals/Urinary Tract: Adrenal glands are within normal limits. Kidneys are atrophic consistent with the known end-stage renal disease. Scattered cysts are noted bilaterally. No follow-up is recommended. Nonobstructing stone is seen in the lower pole of the right kidney. Left-sided nephrostomy catheter is noted and stable. No ureteral stones are seen. The bladder is decompressed. Stomach/Bowel: There are changes consistent with right-sided ileostomy and prior total colectomy. These are stable in appearance. No small bowel obstructive changes are seen. The stomach is within normal limits. Drainage catheter is seen in the presacral region. Vascular/Lymphatic: Aortic atherosclerosis. No enlarged abdominal or pelvic lymph nodes. Left AV graft is noted. Reproductive: Prostate is unremarkable.  Penile prosthesis is  noted. Other: No abdominal wall hernia or abnormality. No abdominopelvic ascites. Musculoskeletal: L5 pars defects are noted with grade 1 anterolisthesis. IMPRESSION: Stable right-sided pleural effusion. Cholelithiasis without complicating factors. Stable right renal calculi.  Stable renal atrophy. Electronically Signed   By: Oneil Devonshire M.D.   On: 04/11/2024 23:08   DG Chest 2 View Result Date: 04/11/2024 CLINICAL DATA:  Follow-up pleural effusion EXAM: CHEST - 2 VIEW COMPARISON:  03/14/2024 FINDINGS: Cardiac shadow is stable. Mild vascular congestion is again noted. Small right pleural effusion is seen similar to that noted on the prior exam. No new focal  infiltrate is seen. Right subclavian vein stent is noted. IMPRESSION: Mild vascular congestion with stable small right pleural effusion. Electronically Signed   By: Oneil Devonshire M.D.   On: 04/11/2024 21:36   DG Chest Port 1 View Result Date: 04/11/2024 CLINICAL DATA:  Possible sepsis EXAM: PORTABLE CHEST 1 VIEW COMPARISON:  04/07/2024 FINDINGS: Cardiac shadow is prominent but accentuated by the portable technique. Thoracic aorta is tortuous. Right subclavian vein stent is noted. Mild central vascular congestion is noted with interstitial edema slightly improved when compared with the prior exam. Small right-sided pleural effusion is noted. Basilar atelectasis is seen as well on the right. IMPRESSION: Mild congestive failure. Right basilar atelectasis and associated effusion. Electronically Signed   By: Oneil Devonshire M.D.   On: 04/11/2024 21:35    Labs:  CBC: Recent Labs    04/18/24 0558 04/18/24 0725 04/20/24 0807 04/23/24 0814  WBC 9.3 9.2 9.6 10.6*  HGB 10.5* 10.0* 10.3* 9.1*  HCT 32.9* 31.6* 31.4* 28.3*  PLT 123* 120* 150 177    COAGS: Recent Labs    01/07/24 0841 01/25/24 1854 01/27/24 0024 02/23/24 0526 02/24/24 0423 03/15/24 0827 03/15/24 1336 03/16/24 0404 03/17/24 0230  INR 1.3* 1.3*  --  1.2  --  1.2  --   --   --    APTT  --   --    < > 124* 74*  --  101* 114* 81*   < > = values in this interval not displayed.    BMP: Recent Labs    04/18/24 0558 04/18/24 0725 04/19/24 0413 04/20/24 0807 04/23/24 0800  NA 138  --  134* 135 132*  K 5.6*  --  5.5* 4.7 4.9  CL 100  --  95* 96* 97*  CO2 20*  --  22 22 18*  GLUCOSE 115*  --  133* 127* 109*  BUN 89*  --  48* 82* 95*  CALCIUM  9.8  --  8.8* 9.1 8.8*  CREATININE 8.71* 8.76* 5.73* 8.05* 7.77*  GFRNONAA 5* 5* 9* 6* 6*    LIVER FUNCTION TESTS: Recent Labs    03/14/24 1700 03/15/24 0321 03/18/24 0730 04/11/24 2110 04/18/24 0558 04/20/24 0807 04/23/24 0800  BILITOT 1.2 1.3*  --  0.5 1.2  --   --   AST 24 49*  --  42* 25  --   --   ALT 23 32  --  23 17  --   --   ALKPHOS 73 73  --  140* 112  --   --   PROT 6.9 5.6*  --  6.7 6.7  --   --   ALBUMIN  3.7 3.0*   < > 4.2 3.3* 3.3* 3.0*   < > = values in this interval not displayed.     Assessment and Plan:  CT today shows that the presacral drain has been pulled out inadvertently and lies on the patient's skin. He is unsure when this happened and does not recall any event where it was pulled. He is currently in a rehab facility. CT does not demonstrate any residual fluid attenuation in the presacral space and Mr. Polinski is currently not having any rectal discharge. It was not felt necessary today to try to recanalize the old drain tract or set him up immediately for new drain placement. He and his daughter will monitor for any signs of recurrent abscess.  The left nephrostomy tube was exchanged today for a new 10 Fr catheter. He will be given an  appointment for another tube change in 8-10 weeks.   Electronically Signed: Marcey ONEIDA Moan 05/05/2024, 3:03 PM    I spent a total of 10 Minutes in face to face in clinical consultation, greater than 50% of which was counseling/coordinating care for abscess drain and nephrostomy tube.

## 2024-05-06 ENCOUNTER — Telehealth: Payer: Self-pay | Admitting: Family Medicine

## 2024-05-06 NOTE — Telephone Encounter (Signed)
 [facility] faxed Home Health Certificate (Order [place] [id number]), to be filled out by provider. Patient requested to send it back via Fax within ASAP. Document is located in providers tray at front office.Please advise at [phone].

## 2024-05-06 NOTE — Telephone Encounter (Signed)
 Completed and faxed 05/06/2024.

## 2024-05-09 ENCOUNTER — Telehealth: Payer: Self-pay

## 2024-05-09 ENCOUNTER — Other Ambulatory Visit: Payer: Self-pay | Admitting: *Deleted

## 2024-05-09 ENCOUNTER — Ambulatory Visit

## 2024-05-09 ENCOUNTER — Ambulatory Visit: Admitting: Family Medicine

## 2024-05-09 ENCOUNTER — Encounter: Payer: Self-pay | Admitting: Family Medicine

## 2024-05-09 VITALS — BP 122/60 | Temp 97.2°F | Ht 68.0 in | Wt 162.2 lb

## 2024-05-09 DIAGNOSIS — N186 End stage renal disease: Secondary | ICD-10-CM

## 2024-05-09 DIAGNOSIS — R5381 Other malaise: Secondary | ICD-10-CM | POA: Diagnosis not present

## 2024-05-09 DIAGNOSIS — Z933 Colostomy status: Secondary | ICD-10-CM | POA: Diagnosis not present

## 2024-05-09 DIAGNOSIS — I951 Orthostatic hypotension: Secondary | ICD-10-CM | POA: Diagnosis not present

## 2024-05-09 DIAGNOSIS — S91309A Unspecified open wound, unspecified foot, initial encounter: Secondary | ICD-10-CM

## 2024-05-09 DIAGNOSIS — E2740 Unspecified adrenocortical insufficiency: Secondary | ICD-10-CM | POA: Diagnosis not present

## 2024-05-09 DIAGNOSIS — L0291 Cutaneous abscess, unspecified: Secondary | ICD-10-CM | POA: Diagnosis not present

## 2024-05-09 DIAGNOSIS — R0789 Other chest pain: Secondary | ICD-10-CM | POA: Diagnosis not present

## 2024-05-09 DIAGNOSIS — Z992 Dependence on renal dialysis: Secondary | ICD-10-CM | POA: Diagnosis not present

## 2024-05-09 DIAGNOSIS — I872 Venous insufficiency (chronic) (peripheral): Secondary | ICD-10-CM

## 2024-05-09 DIAGNOSIS — G9341 Metabolic encephalopathy: Secondary | ICD-10-CM

## 2024-05-09 NOTE — Assessment & Plan Note (Signed)
 Ongoing issues-continue midodrine  30 mg 3 times a day as well as hydrocortisone  20 mg and 10 mg alternating times a day-for adrenal insufficiency

## 2024-05-09 NOTE — Transitions of Care (Post Inpatient/ED Visit) (Signed)
 05/09/2024  Name: Joshua Riley MRN: [id number] DOB: 1936/06/18  Today's TOC FU Call Status: Today's TOC FU Call Status:: Successful TOC FU Call Completed TOC FU Call Complete Date: 05/09/24 Patient's Name and Date of Birth confirmed.  Transition Care Management Follow-up Telephone Call Date of Discharge: 05/07/24 Discharge Facility: Other [facility]) Name of Other (Non[facility]) Discharge Facility: [facility] Type of Discharge: Inpatient Admission How have you been since you were released from the hospital?: Better Any questions or concerns?: No  Items Reviewed: Did you receive and understand the discharge instructions provided?: Yes Medications obtained,verified, and reconciled?: Yes (Medications Reviewed) Any new allergies since your discharge?: No Dietary orders reviewed?: Yes Do you have support at home?: Yes Name of Support/Comfort Primary Source: caregiver  Medications Reviewed Today: Medications Reviewed Today     Reviewed by Emmitt Pan, LPN (Licensed Practical Nurse) on 05/09/24 at 1151  Med List Status: <None>   Medication Order Taking? Sig Documenting Provider Last Dose Status Informant  atorvastatin  (LIPITOR) 20 MG tablet [id number] Yes Take 1 tablet (20 mg total) by mouth daily. Sebastian Toribio GAILS, MD  Active Pharmacy Records, Child           Med Note JACKOLYN WADDELL DEL   Tue Mar 15, 2024  7:49 AM)    calcitRIOL  (ROCALTROL ) 0.5 MCG capsule [id number] Yes Take 1 capsule (0.5 mcg total) by mouth every Monday, Wednesday, and Friday with hemodialysis. Danford, Lonni SQUIBB, MD  Active Pharmacy Records, Child           Med Note JACKOLYN WADDELL DEL   Tue Mar 15, 2024  7:50 AM)    Cyanocobalamin  (VITAMIN B-12 PO) [id number] Yes Take 1 capsule by mouth in the morning and at bedtime. [provider]  Active Pharmacy Records, Child  dorzolamide  (TRUSOPT ) 2 % ophthalmic solution [id number] Yes Place 1 drop into the right eye at bedtime. [provider]  Active Pharmacy Records, Child           Med Note STEFFI NIAN   Sat Nov 28, 2023 12:49 PM)    ELIQUIS  2.5 MG TABS tablet [id number] Yes Take 1 tablet (2.5 mg total) by mouth 2 (two) times daily. Katrinka Garnette KIDD, MD  Active Pharmacy Records, Child           Med Note EFRAIM, ALFREIDA CROME   Tue Apr 12, 2024  9:21 PM) Patient knows he took it this afternoon but unable to give a specific time.  ertapenem  1 g in sodium chloride  0.9 % 100 mL [id number] Yes Inject 1 g into the vein every Monday, Wednesday, and Friday at 6 PM. End date: 04/29/2024. Hongalgi, Anand D, MD  Active   fludrocortisone  (FLORINEF ) 0.1 MG tablet [id number] Yes Take 1 tablet (0.1 mg total) by mouth daily. Laurence Locus, DO  Active   folic acid  (FOLVITE ) 1 MG tablet [id number] Yes Take 1 tablet (1 mg total) by mouth daily. Regalado, Belkys A, MD  Active Pharmacy Records, Child           Med Note EFRAIM ALFREIDA CROME   Tue Apr 12, 2024  8:42 PM)    gabapentin  (NEURONTIN ) 300 MG capsule [id number] Yes TAKE 1 CAPSULE BY MOUTH ONCE A DAY AT BEDTIME FOR NEUROPATHY Katrinka Garnette KIDD, MD  Active Pharmacy Records, Child  hydrocortisone  (CORTEF ) 10 MG tablet [id number] Yes Take 1 tablet (10 mg total) by mouth every evening. Samtani, Jai-Gurmukh, MD  Active Pharmacy Records, Child  Med Note (SATTERFIELD, TEENA BRAVO   Mon Apr 18, 2024  7:55 PM) Daughter verified patient is also taking the 20 mg tablet in the mornings only   hydrocortisone  (CORTEF ) 20 MG tablet [id number] Yes Take 1 tablet (20 mg total) by mouth daily. Samtani, Jai-Gurmukh, MD  Active Pharmacy Records, Child  midodrine  (PROAMATINE ) 10 MG tablet [id number] Yes Take 3 tablets (30 mg total) by mouth 3 (three) times daily. Samtani, Jai-Gurmukh, MD  Active Pharmacy Records, Child  mometasone -formoterol  (DULERA ) 200-5 MCG/ACT AERO [id number] Yes Inhale 2 puffs into the lungs 2 (two) times daily. Katrinka Garnette KIDD, MD  Active Pharmacy Records, Child  multivitamin (RENA-VIT)  TABS tablet [id number] Yes Take 1 tablet by mouth daily. [provider]  Active Pharmacy Records, Child  Omega Fatty Acids-Vitamins (OMEGA-3 GUMMIES) CHEW [id number] Yes Chew 1,200 mg by mouth in the morning and at bedtime. [provider]  Active Pharmacy Records, Child  pantoprazole  (PROTONIX ) 40 MG tablet [id number] Yes Take 1 tablet (40 mg total) by mouth 2 (two) times daily.  Patient taking differently: Take 40 mg by mouth 2 (two) times daily as needed (for acid reflux).   Sebastian Toribio GAILS, MD  Active Pharmacy Records, Child           Med Note EFRAIM, ALFREIDA CROME   Tue Apr 12, 2024  8:42 PM)    ramelteon  (ROZEREM ) 8 MG tablet [id number] Yes Take 1 tablet (8 mg total) by mouth at bedtime. Katrinka Garnette KIDD, MD  Active Pharmacy Records, Child  Sodium Chloride  Flush (SALINE FLUSH) 0.9 % SOLN [id number] Yes Use 5 mLs by Intracatheter route daily as directed. Gery Daved SAILOR, PA  Active Pharmacy Records, Child  timolol  (TIMOPTIC ) 0.5 % ophthalmic solution [id number] Yes Place 1 drop into both eyes in the morning and at bedtime. [provider]  Active Pharmacy Records, Child           Med Note STEFFI NIAN   Sat Nov 28, 2023 12:48 PM)    traMADol  (ULTRAM ) 50 MG tablet [id number] Yes Take 1 tablet (50 mg total) by mouth every 12 (twelve) hours as needed for moderate pain (pain score 4-6) or severe pain (pain score 7-10). Hongalgi, Anand D, MD  Active   TYLENOL  325 MG tablet [id number] Yes Take 325-650 mg by mouth every 6 (six) hours as needed for mild pain (pain score 1-3) or headache. [provider]  Active Pharmacy Records, Child  Med List Note Donnajean Suzen SQUIBB, CPhT 06/02/22 1426): Dialysis [facility], Friday            Home Care and Equipment/Supplies: Were Home Health Services Ordered?: Yes Name of Home Health Agency:: unknown Has Agency set up a time to come to your home?: No Any new equipment or medical supplies ordered?: NA  Functional  Questionnaire: Do you need assistance with bathing/showering or dressing?: Yes Do you need assistance with meal preparation?: No Do you need assistance with eating?: No Do you have difficulty maintaining continence: Yes Do you need assistance with getting out of bed/getting out of a chair/moving?: No Do you have difficulty managing or taking your medications?: Yes  Follow up appointments reviewed: PCP Follow-up appointment confirmed?: Yes Date of PCP follow-up appointment?: 05/09/24 Follow-up Provider: [facility] Follow-up appointment confirmed?: NA Do you need transportation to your follow-up appointment?: No Do you understand care options if your condition(s) worsen?: Yes-patient verbalized understanding    SIGNATURE Julian Lemmings, LPN [facility] Nurse Health Advisor Direct Dial [phone]

## 2024-05-09 NOTE — Progress Notes (Signed)
 Phone [phone]   Subjective:  Joshua Riley is a 88 y.o. year old very pleasant male patient who presents for transitional care management and hospital follow up for metabolic encephalopathy/extremely high risk of rehospitalization. Patient was hospitalized from 04/18/2024 to 04/26/2024 but not discharged from nursing facility until 05/07/2024. A TCM phone call was completed on 05/09/2024. Medical complexity moderate  Patient was hospitalized for acute metabolic encephalopathy as well as orthostatic hypotension and patient with sacral osteomyelitis completing prolonged ertapenem  and hospitalization week prior to current admission for orthostatic hypotension treated with midodrine  up to 30 mg 3 times daily with known adrenal insufficiency  Thankfully encephalopathy resolved quickly and then patient was essentially awaiting skilled nursing facility placement.  Etiology of metabolic encephalopathy was unclear.  They noted orthostatic hypotension mainly with hemodialysis-he remained on midodrine  30 mg 3 times a day as well as Florinef  0.1 mg daily-unfortunately after prior hospitalization he was not restarted on this and skilled nursing facility appears-possibly could have contributed to metabolic encephalopathy  For his presacral abscess/history of ESBL E. coli-remains on IV ertapenem  with end date 07/31/23- with plan for outpatient ID appointment. Had also been on vancomycin  but this was discontinued.   Paroxysmal atrial fibrillation off of rate control due to chronic hypotension particular with dialysis.  Amiodarone  had been discontinued during recent hospitalization-he remained on Eliquis -extensive bruising was noted in the hospital   Ostomy care-post colectomy and colostomy-has had rash as he is having some leakage at the bottom of the colostomy site.  Also with drain that fell out from abscess-no recurrent drainage from this area or worsening pain thankfully  Hospital noted multiple pressure  injuries of the skin-advised outpatient wound care-had worked with skilled nursing facility but when discharged was given complex regimen that family and caregiver have not been able to maintain due to complexity  He remains on dialysis on a regular basis Monday Wednesday Friday  Today: -Caregiver found him yesterday down on the ground- he was too weak to get himself back onto the couch he slid off of- right sided rib pain has been noted -no dialysis since Friday- was not able to get today. Will get back to back days starting tomorrow morning. 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 From his Care Everywhere records, this appears to have been performed in [place] in June 2011 06/02/2022    Priority: High   End-stage renal disease on hemodialysis (HCC) 05/06/2022    Priority: High   Paroxysmal atrial fibrillation (HCC) 11/18/2021    Priority: High   Orthostatic hypotension 10/22/2021    Priority: High   Colostomy status (HCC) -  Care Everywhere records reviewed. States pt had total colectomy performed in June 2011 in [place] . 10/22/2021    Priority: High   Nephrostomy present (HCC) - left kidney. Care Everywhere records reviewed. appears to bave been ordered by Dr. Dasie Clark in [place] . 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 Current Outpatient Medications  Medication Sig Dispense Refill   atorvastatin  (LIPITOR) 20 MG tablet Take 1 tablet (20 mg total) by mouth daily. 90 tablet 0   calcitRIOL  (ROCALTROL ) 0.5 MCG capsule Take 1 capsule (0.5 mcg total) by mouth every Monday, Wednesday, and Friday with hemodialysis.     Cyanocobalamin  (VITAMIN B-12 PO) Take 1 capsule by mouth in the morning and at bedtime.     dorzolamide  (TRUSOPT ) 2 % ophthalmic solution Place 1 drop into the right eye at bedtime.     ELIQUIS  2.5 MG TABS tablet Take 1 tablet (2.5 mg total) by mouth 2 (two) times daily. 60 tablet 11   ertapenem  1 g in sodium chloride  0.9 % 100 mL Inject 1 g into the vein every Monday, Wednesday, and Friday at 6 PM. End date: 04/29/2024.     fludrocortisone  (FLORINEF ) 0.1 MG tablet Take 1 tablet (0.1 mg total) by mouth daily. 90 tablet 0   folic acid  (FOLVITE ) 1 MG tablet Take 1 tablet (1 mg total) by mouth daily. 30 tablet 0   gabapentin  (NEURONTIN ) 300 MG capsule TAKE 1 CAPSULE BY MOUTH ONCE A DAY AT BEDTIME FOR NEUROPATHY 90 capsule 3   hydrocortisone  (CORTEF ) 10 MG tablet Take 1 tablet (10 mg total) by mouth every evening. 30 tablet 2   hydrocortisone  (CORTEF ) 20 MG tablet Take 1  tablet (20 mg total) by mouth daily. 30 tablet 2   midodrine  (PROAMATINE ) 10 MG tablet Take 3 tablets (30 mg total) by mouth 3 (three) times daily. 270 tablet 1   mometasone -formoterol  (DULERA ) 200-5 MCG/ACT AERO Inhale 2 puffs into the lungs 2 (two) times daily. 1 each 5   multivitamin (RENA-VIT) TABS tablet Take 1 tablet by mouth daily.     Omega Fatty Acids-Vitamins (OMEGA-3 GUMMIES) CHEW Chew 1,200 mg by mouth in the morning and at bedtime.     pantoprazole  (PROTONIX ) 40 MG tablet Take 1 tablet (40 mg total) by mouth 2 (two) times daily. (Patient taking differently: Take 40 mg by mouth 2 (two) times daily as needed (for acid reflux).) 60 tablet 1   ramelteon  (ROZEREM ) 8 MG tablet Take 1 tablet (8 mg total) by mouth at bedtime. 30 tablet 5   Sodium Chloride  Flush (SALINE FLUSH) 0.9 % SOLN Use 5 mLs by Intracatheter route daily as directed. 150 mL 1   timolol  (TIMOPTIC ) 0.5 % ophthalmic solution Place 1 drop into both eyes in the morning and at bedtime.     traMADol  (ULTRAM ) 50 MG tablet Take 1 tablet (50 mg  total) by mouth every 12 (twelve) hours as needed for moderate pain (pain score 4-6) or severe pain (pain score 7-10). 6 tablet 0   TYLENOL  325 MG tablet Take 325-650 mg by mouth every 6 (six) hours as needed for mild pain (pain score 1-3) or headache.     No current facility-administered medications for this visit.   Objective  Objective:  BP 122/60 (BP Location: Left Arm, Patient Position: Sitting, Cuff Size: Normal)   Temp (!) 97.2 F (36.2 C) (Temporal)   Ht 5' 8 (1.727 m)   Wt 162 lb 3.2 oz (73.6 kg)   BMI 24.66 kg/m  Gen: NAD, resting comfortably, falls asleep at times during visit Edema noted Wounds loosely wrapped on bilateral lower legs-has postop shoe to allow him to keep dressings on   Assessment and Plan:   #TCM/hospital follow up   Assessment & Plan Acute metabolic encephalopathy This has resolved-unfortunately there is no available history of metabolic  encephalopathy which would be preferred diagnosis-unclear trigger but certainly thankful this has resolved.  Of note previously was on ertapenem  for sacral osteomyelitis but completed that on 04/29/2024 Rib pain on right side Unclear origin but did seem to worsen after he slid off the couch yesterday-we opted to get x-rays of the ribs as well as right sided lung field Venous insufficiency Venous insufficiency but also chronic swelling due to dialysis-the fact he has not been able to get dialysis today certainly contributes-thankfully has back-to-back days Tuesday and Thursday but hopefully his orthostatic intolerance can handle this Multiple open wounds of foot Multiple open wounds reported on the toes and onto the legs-I did not get a chance to view the wounds today as when I was about to undress the wounds we had to take him to x-ray due to time constraints and staff only available to redress while visiting another patient-regardless the main key here is wound care and we have placed a referral to home health RN with request for wound care specifically.  Very high risk for repeat hospitalization or infection related to these wounds so needs close follow-up -Complex wound care was given but we essentially tried to minimize this with Xeroform base layer, covered with Kerlix covered with Ace bandage and this was much easier for family/caregiver to help with Debility Related with recent hospitalizations and very high risk for repeat hospitalization-referral to be [facility] was placed with hopes to keep mild hospital-in particular if they could help him with ostomy care I think that would be helpful as he has had a difficult time getting in with the ostomy clinic End-stage renal disease on hemodialysis (HCC) As above thankfully getting back-to-back days Tuesday and Thursday Orthostatic hypotension Ongoing issues-continue midodrine  30 mg 3 times a day as well as hydrocortisone  20 mg and 10 mg alternating times a  day-for adrenal insufficiency Colostomy status (HCC) -  Care Everywhere records reviewed. States pt had total colectomy performed in June 2011 in [place] . Ongoing issue- Adrenal insufficiency See above Presacral Abscess Drain has come out but no obvious recurrence of this after prolonged antibiotics-continue close follow-up with infectious disease-has visit in 10 days   Recommended follow up: Return for next already scheduled visit or sooner if needed. Future Appointments  Date Time Provider Department Center  05/19/2024 11:00 AM Luiz Channel, MD [facility]  06/09/2024  1:20 PM Katrinka Garnette KIDD, MD [facility]    Lab/Order associations:   ICD-10-CM   1. Acute metabolic encephalopathy  G93.41  2. Rib pain on right side  R07.89 DG Ribs Unilateral W/Chest Right    3. Venous insufficiency  I87.2 Ambulatory referral to Home Health    4. Multiple open wounds of foot  S91.309A Ambulatory referral to Home Health    AMB Referral [facility] Care Management    5. Debility  R53.81 Ambulatory referral to Home Health    AMB Referral [facility] Care Management    6. End-stage renal disease on hemodialysis (HCC)  N18.6 AMB Referral [facility] Care Management   Z99.2     7. Orthostatic hypotension  I95.1 AMB Referral [facility] Care Management    8. Colostomy status (HCC) -  Care Everywhere records reviewed. States pt had total colectomy performed in June 2011 in [place] .  Z93.3     9. Adrenal insufficiency  E27.40     10. Presacral Abscess  L02.91      No orders of the defined types were placed in this encounter.  I personally spent a total of 50 minutes in the care of the patient today including preparing to see the patient, getting/reviewing separately obtained history, performing a medically appropriate exam/evaluation, counseling and educating, placing orders, referring and communicating with other health care professionals, and documenting clinical information in the EHR.   Return  precautions advised.  Garnette Lukes, MD

## 2024-05-09 NOTE — Assessment & Plan Note (Signed)
 Drain has come out but no obvious recurrence of this after prolonged antibiotics-continue close follow-up with infectious disease-has visit in 10 days

## 2024-05-09 NOTE — Assessment & Plan Note (Signed)
 See above

## 2024-05-09 NOTE — Assessment & Plan Note (Signed)
 This has resolved-unfortunately there is no available history of metabolic encephalopathy which would be preferred diagnosis-unclear trigger but certainly thankful this has resolved.  Of note previously was on ertapenem  for sacral osteomyelitis but completed that on 04/29/2024

## 2024-05-09 NOTE — Patient Outreach (Signed)
 Verified in [facility], Mr. Fleck discharged from [facility] on 05/07/24. [facility] SNF waiver was previously utilized for admission to [facility].   Update received from [place], [facility] Admissions Director. Mr. Woodham returned home with [facility].   Will refer to [facility] CCM team. Mr. Dotson has history of multiple hospitalizations and ED visits per [facility].   Mr. Piascik has medical history of ESRD on HD MWF, orthostatic hypotension, presacral abscess/ESBL E. Coli, paroxysmal atrial fibrillation.  Pablo Hurst, MSN, RN, BSN [place]  [facility] RN Post- Acute Care Manager Direct Dial: [phone]

## 2024-05-09 NOTE — Assessment & Plan Note (Signed)
 Ongoing issue

## 2024-05-09 NOTE — Patient Instructions (Addendum)
 X-ray results may take a few days  Urgent home health referral- THRILLED the nurse will see you tomorrow- ideally changing dressing daily unless they have other suggestions  Referring to [facility] to see if we can get any more resources to you with high risk for hospitalization  Recommended follow up: Return for next already scheduled visit or sooner if needed.

## 2024-05-09 NOTE — Telephone Encounter (Signed)
 I saw him today thank you

## 2024-05-09 NOTE — Assessment & Plan Note (Signed)
 As above thankfully getting back-to-back days Tuesday and Thursday

## 2024-05-11 ENCOUNTER — Encounter: Payer: Self-pay | Admitting: Family Medicine

## 2024-05-11 DIAGNOSIS — S91309A Unspecified open wound, unspecified foot, initial encounter: Secondary | ICD-10-CM

## 2024-05-11 NOTE — Telephone Encounter (Signed)
 Sent to stat pool

## 2024-05-12 ENCOUNTER — Telehealth: Payer: Self-pay | Admitting: *Deleted

## 2024-05-12 ENCOUNTER — Ambulatory Visit: Admitting: Orthopedic Surgery

## 2024-05-12 ENCOUNTER — Ambulatory Visit: Admitting: Internal Medicine

## 2024-05-12 DIAGNOSIS — I739 Peripheral vascular disease, unspecified: Secondary | ICD-10-CM | POA: Diagnosis not present

## 2024-05-12 NOTE — Progress Notes (Signed)
 Complex Care Management Note Care Guide Note  05/12/2024 Name: Joshua Riley MRN: [id number] DOB: [birth year]   Complex Care Management Outreach Attempts: An unsuccessful telephone outreach was attempted today to offer the patient information about available complex care management services.  Follow Up Plan:  Additional outreach attempts will be made to offer the patient complex care management information and services.   Encounter Outcome:  No Answer  Thedford Franks, CMA [place]  [facility] Guide Direct Dial: [phone]  Fax: [phone] Website: [email or web address]

## 2024-05-13 ENCOUNTER — Ambulatory Visit (HOSPITAL_COMMUNITY)
Admission: RE | Admit: 2024-05-13 | Discharge: 2024-05-13 | Disposition: A | Discharge status: Home / Self Care | Source: Ambulatory Visit | Attending: Orthopedic Surgery | Admitting: Orthopedic Surgery

## 2024-05-13 ENCOUNTER — Encounter: Payer: Self-pay | Admitting: Orthopedic Surgery

## 2024-05-13 ENCOUNTER — Telehealth: Payer: Self-pay | Admitting: Family Medicine

## 2024-05-13 DIAGNOSIS — I739 Peripheral vascular disease, unspecified: Secondary | ICD-10-CM | POA: Diagnosis present

## 2024-05-13 LAB — VAS US ABI WITH/WO TBI

## 2024-05-13 NOTE — Progress Notes (Signed)
 Office Visit Note   Patient: Joshua Riley           Date of Birth: 07/13/1935           MRN: [id number] Visit Date: 05/12/2024              Requested by: Katrinka Garnette KIDD, MD [address] [place],  [place] [address] PCP: Katrinka Garnette KIDD, MD  Chief Complaint  Patient presents with   Right Foot - Wound Check   Left Foot - Wound Check      HPI: Discussed the use of AI scribe software for clinical note transcription with the patient, who gave verbal consent to proceed.  History of Present Illness Joshua Riley is an 88 year old male with necrotic ulcers and mixed venous and arterial insufficiency who presents for evaluation of lower extremity ulcers and pain.  He has necrotic ulcers on the inside of his right ankle and over the tops of his toes on both feet, present for at least five weeks. He experiences significant pain in his legs, stating that they 'hurt so bad today that he almost fell in the parking lot.'  He has a history of neuropathy and is currently on dialysis. He has been using different types of compression wraps at home, which are applied by his daughter and occasionally by a nurse. No prior blood pressure study on his ankles has been done.  His legs swell, and he experiences pain when his feet are elevated too high.     Assessment & Plan: Visit Diagnoses:  1. Claudication   2. 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 Pitting edema present. Monophasic dorsalis pedis pulses bilaterally. Pain from inadequate blood flow and oxygenation. Improved circulation needed for wound care. - Applied gauze and ace wrap over ulcers, change daily. - Ordered urgent referral to vascular surgery for ankle brachial indices studies  and possible endovascular evaluation. - Advised to avoid excessive compression to prevent further circulation impairment. - Educated on leg elevation to heart level to manage pain.      Follow-Up Instructions: Return in about 2 weeks (around 05/26/2024).   Ortho Exam  Patient is alert, oriented, no adenopathy, well-dressed, normal affect, normal respiratory effort. Physical Exam CARDIOVASCULAR: Pulses not palpable, with Doppler Monophasic dorsalis pedis pulses bilaterally. EXTREMITIES: Pitting edema in both lower extremities. Necrotic ulcers over ankles and forefoot bilaterally.      Imaging: VAS US  ABI WITH/WO TBI Result Date: 05/13/2024  LOWER EXTREMITY DOPPLER STUDY Patient Name:  Joshua Riley  Date of Exam:   05/13/2024 Medical Rec #: [id number]        Accession #:    [id number] Date of Birth: 1935/11/06        Patient Gender: M Patient Age:   78 years Exam Location:  [address] Procedure:      VAS US  ABI WITH/WO TBI Referring Phys: Takuma Cifelli --------------------------------------------------------------------------------  Indications: Ulceration bilaterally.. High Risk Factors: Past history of smoking.  Limitations: Today's exam was limited due to bandages. Performing Technologist: Devere Dark RVT  Examination Guidelines: A complete evaluation includes at minimum, Doppler waveform signals and systolic blood pressure reading at the level of bilateral brachial, anterior tibial, and posterior tibial arteries, when vessel segments are accessible. Bilateral testing is considered an integral part  of a complete examination. Photoelectric Plethysmograph (PPG) waveforms and toe systolic pressure readings are included as required and additional duplex testing as needed. Limited examinations for reoccurring indications may be performed as noted.  ABI Findings: +---------+------------------+-----+---------+--------+ Right    Rt Pressure (mmHg)IndexWaveform Comment   +---------+------------------+-----+---------+--------+ Brachial 67                                       +---------+------------------+-----+---------+--------+ PTA      107               1.60 biphasic          +---------+------------------+-----+---------+--------+ DP       254               3.79 triphasic         +---------+------------------+-----+---------+--------+ Great Toe27                0.40 Abnormal          +---------+------------------+-----+---------+--------+ +---------+------------------+-----+---------+------------+ Left     Lt Pressure (mmHg)IndexWaveform Comment      +---------+------------------+-----+---------+------------+ Brachial 57                                           +---------+------------------+-----+---------+------------+ PTA      254               3.79 triphasic             +---------+------------------+-----+---------+------------+ DP       254               3.79 triphasic             +---------+------------------+-----+---------+------------+ Great Toe                                not obtained +---------+------------------+-----+---------+------------+ +-------+-----------+----------------+------------+------------+ ABI/TBIToday's ABIToday's TBI     Previous ABIPrevious TBI +-------+-----------+----------------+------------+------------+ Right  [place]         0.40                                     +-------+-----------+----------------+------------+------------+ Left   [place]         Unable to obtain                         +-------+-----------+----------------+------------+------------+  Summary: Right: Resting right ankle-brachial index indicates noncompressible right lower extremity arteries. The right toe-brachial index is abnormal.  Left: Resting left ankle-brachial index indicates noncompressible left lower extremity arteries. Unble to obtain toe-brachial index due to excessive movement.  *See table(s)  above for measurements and observations.     Preliminary    No images are attached to the encounter.  Labs: Lab Results  Component Value Date   HGBA1C 5.9 (H) 11/09/2023   ESRSEDRATE 16 04/12/2024   ESRSEDRATE 28 (H) 03/31/2024   ESRSEDRATE 80 (H) 01/25/2024   CRP 1.8 (H) 04/12/2024   CRP 11.5 (H) 03/31/2024   CRP 4.6 (H) 01/25/2024   REPTSTATUS 04/23/2024 FINAL 04/18/2024   GRAMSTAIN  03/16/2024    ABUNDANT WBC PRESENT, PREDOMINANTLY PMN NO ORGANISMS SEEN    CULT  04/18/2024    NO  GROWTH 5 DAYS Performed at [facility], [address], [place], [place] [address]    [facility] STAPHYLOCOCCUS AUREUS 03/16/2024   LABORGA KLEBSIELLA PNEUMONIAE 03/16/2024   LABORGA ESCHERICHIA COLI 03/16/2024   LABORGA STREPTOCOCCUS ANGINOSIS 03/16/2024     Lab Results  Component Value Date   ALBUMIN  3.0 (L) 04/23/2024   ALBUMIN  3.3 (L) 04/20/2024   ALBUMIN  3.3 (L) 04/18/2024    Lab Results  Component Value Date   MG 1.8 01/31/2024   MG 1.9 01/27/2024   MG 1.9 01/26/2024   No results found for: VD25OH  No results found for: PREALBUMIN    Latest Ref Rng & Units 04/23/2024    8:14 AM 04/20/2024    8:07 AM 04/18/2024    7:25 AM  CBC EXTENDED  WBC 4.0 - 10.5 K/uL 10.6  9.6  9.2   RBC 4.22 - 5.81 MIL/uL 2.47  2.73  2.71   Hemoglobin 13.0 - 17.0 g/dL 9.1  89.6  89.9   HCT 60.9 - 52.0 % 28.3  31.4  31.6   Platelets 150 - 400 K/uL 177  150  120   NEUT# 1.7 - 7.7 K/uL  7.9    Lymph# 0.7 - 4.0 K/uL  0.7       There is no height or weight on file to calculate BMI.  Orders:  Orders Placed This Encounter  Procedures   Ambulatory referral to Vascular Surgery   VAS US  ABI WITH/WO TBI   No orders of the defined types were placed in this encounter.    Procedures: No procedures performed  Clinical Data: No additional findings.  ROS:  All other systems negative, except as noted in the HPI. Review of Systems  Objective: Vital Signs: There were no vitals taken for  this visit.  Specialty Comments:  No specialty comments available.  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 From his Care Everywhere records, this appears to have been performed in [place] in June 2011 06/02/2022   Acute metabolic encephalopathy 06/02/2022   End-stage renal disease on hemodialysis (HCC) 05/06/2022   Glaucoma 11/18/2021   Paroxysmal atrial fibrillation (HCC) 11/18/2021   Orthostatic hypotension 10/22/2021   Idiopathic neuropathy 10/22/2021   Colostomy status (HCC) -  Care Everywhere records reviewed. States pt had total colectomy performed in June 2011 in [place] . 10/22/2021   Nephrostomy present (HCC) - left kidney. Care Everywhere records reviewed. appears to bave been ordered by Dr. Dasie Clark in [place] . placed 02-2012 10/22/2021   Past Medical History:  Diagnosis Date   A-fib [facility])    Abscess of groin, left 07/07/2023   Anemia    Arthritis    Cancer (HCC)    Basal cell   COVID-19    2021   Dysrhythmia    Afib-controlled on eliquis    ESRD (end  stage renal disease) (HCC) 10/22/2021   Glaucoma 11/18/2021   History of DVT (deep vein thrombosis)    History of small bowel obstruction 02/11/2024   Hydronephrosis    managed wtih a PCN   Idiopathic neuropathy 10/22/2021   lyrica      Ileostomy in place [facility])    Obstructive uropathy    With chronic left nephrostomy   Old retinal detachment, total or subtotal    Orthostatic hypotension 10/22/2021   Seizure like activity 11/28/2023   Sleep apnea    does not need a machine   Small bowel obstruction (HCC) 01/25/2024   Stroke (HCC)    Ulcerative colitis (HCC)    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    Family History  Problem Relation Age of Onset   Stroke Mother    Cancer Father    Esophageal cancer Brother     Past Surgical History:  Procedure Laterality Date   BASAL CELL CARCINOMA EXCISION     10/23   COLON SURGERY     creation of j pouch     and subsequent takedown of j pouch   EYE SURGERY     IR CATHETER TUBE CHANGE  12/18/2023   IR CATHETER TUBE CHANGE  02/19/2024   IR CATHETER TUBE CHANGE  02/19/2024   IR CATHETER TUBE CHANGE  05/05/2024   IR NEPHROSTOMY EXCHANGE LEFT  12/10/2021   IR NEPHROSTOMY EXCHANGE LEFT  04/22/2022   IR NEPHROSTOMY EXCHANGE LEFT  07/29/2022   IR NEPHROSTOMY EXCHANGE LEFT  10/28/2022   IR NEPHROSTOMY EXCHANGE LEFT  02/05/2023   IR NEPHROSTOMY EXCHANGE LEFT  05/07/2023   IR NEPHROSTOMY EXCHANGE LEFT  06/25/2023   IR NEPHROSTOMY EXCHANGE LEFT  07/17/2023   IR NEPHROSTOMY EXCHANGE LEFT  10/22/2023   IR RADIOLOGIST EVAL & MGMT  11/25/2023   IR RADIOLOGIST EVAL & MGMT  12/18/2023   IR RADIOLOGIST EVAL & MGMT  02/19/2024   IR RADIOLOGIST EVAL & MGMT  04/04/2024   IR RADIOLOGIST EVAL & MGMT  05/05/2024   IR THROMBECTOMY AV FISTULA W/THROMBOLYSIS/PTA INC/SHUNT/IMG LEFT Left 02/23/2024   IR US  GUIDE VASC ACCESS LEFT  02/23/2024   LEFT HEART CATH AND CORONARY ANGIOGRAPHY N/A 07/22/2022   Procedure: LEFT HEART CATH AND CORONARY ANGIOGRAPHY;  Surgeon: Jordan, Peter M, MD;  Location: [facility];  Service: Cardiovascular;  Laterality: N/A;   REVISION OF ARTERIOVENOUS GORETEX GRAFT Left 05/06/2022   Procedure: REDO LEFT THIGH ARTERIOVENOUS 4-7 MM GORETEX GRAFT;  Surgeon:  Eliza Lonni RAMAN, MD;  Location: [facility] OR;  Service: Vascular;  Laterality: Left;   SMALL INTESTINE SURGERY     TOTAL COLECTOMY     Social History   Occupational History   Not on file  Tobacco Use   Smoking status: Former    Current packs/day: 0.00    Average packs/day: 2.0 packs/day for 6.0 years (12.0 ttl pk-yrs)    Types: Cigarettes    Start date: 11    Quit date: 68    Years since quitting: 40.8    Passive exposure: Never   Smokeless tobacco: Never  Vaping Use   Vaping status: Never Used  Substance and Sexual Activity   Alcohol use: Yes    Alcohol/week: 5.0 standard drinks of alcohol    Types: 5 Shots of liquor per week    Comment: socially   Drug use: Never   Sexual activity: Not Currently

## 2024-05-13 NOTE — Telephone Encounter (Signed)
 So we were asking for home health to provide wound care - I'm happy to sign off on any suggested orders -if not- where are we at on wound care referral? Believe this was stat  For now I'm ok with xeroform on wounds covered with cobain and covered with ace wrap until we get the above 2 things set up

## 2024-05-13 NOTE — Progress Notes (Signed)
 Complex Care Management Note Care Guide Note  05/13/2024 Name: Joshua Riley MRN: [id number] DOB: 1936/06/01   Complex Care Management Outreach Attempts: A second unsuccessful outreach was attempted today to offer the patient with information about available complex care management services.  Follow Up Plan:  Additional outreach attempts will be made to offer the patient complex care management information and services.   Encounter Outcome:  Patient Request to Call Back  Thedford Franks, CMA [place]  [facility], [facility] Guide Direct Dial: [phone]  Fax: [phone] Website: [email or web address]

## 2024-05-13 NOTE — Telephone Encounter (Signed)
 Please see message and advise.    Copied from CRM #[id number]. Topic: Clinical - Home Health Verbal Orders >> May 13, 2024  1:20 PM Montie POUR wrote: Caller/Agency: Leonor with [facility]  Callback Number: She has a secured voicemail [phone] Service Requested: Skilled Nursing Frequency: Leonor needs new wound care orders as soon as possible.  Any new concerns about the patient? Yes

## 2024-05-16 ENCOUNTER — Ambulatory Visit: Payer: Self-pay | Admitting: Family Medicine

## 2024-05-16 NOTE — Progress Notes (Signed)
 Complex Care Management Note Care Guide Note  05/16/2024 Name: Joshua Riley MRN: [id number] DOB: January 01, 1936   Complex Care Management Outreach Attempts: A third unsuccessful outreach was attempted today to offer the patient with information about available complex care management services.  Follow Up Plan:  No further outreach attempts will be made at this time. We have been unable to contact the patient to offer or enroll patient in complex care management services.  Encounter Outcome:  No Answer  Thedford Franks, CMA [place]  [facility], [facility] Guide Direct Dial: [phone]  Fax: [phone] Website: [email or web address]

## 2024-05-16 NOTE — Telephone Encounter (Signed)
 Dr. Harden will be seeing him once the ABI he ordered is completed.

## 2024-05-16 NOTE — Telephone Encounter (Signed)
 Okay you can provide the orders as I noted below then unless they have a specific wound care nurse

## 2024-05-16 NOTE — Telephone Encounter (Signed)
 Noted

## 2024-05-17 ENCOUNTER — Other Ambulatory Visit: Payer: Self-pay

## 2024-05-17 ENCOUNTER — Ambulatory Visit: Attending: Vascular Surgery | Admitting: Vascular Surgery

## 2024-05-17 ENCOUNTER — Encounter: Payer: Self-pay | Admitting: Vascular Surgery

## 2024-05-17 VITALS — BP 87/60 | HR 106 | Temp 97.7°F | Ht 68.0 in | Wt 162.0 lb

## 2024-05-17 DIAGNOSIS — I70235 Atherosclerosis of native arteries of right leg with ulceration of other part of foot: Secondary | ICD-10-CM | POA: Diagnosis not present

## 2024-05-17 DIAGNOSIS — I70245 Atherosclerosis of native arteries of left leg with ulceration of other part of foot: Secondary | ICD-10-CM | POA: Insufficient documentation

## 2024-05-17 NOTE — Progress Notes (Signed)
 [facility] [place]  ASSESSMENT / PLAN: Joshua Riley is a 88 y.o. 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  Recommend:  Abstinence from all tobacco products. Blood glucose control with goal A1c < 7%. Blood pressure control with goal blood pressure < 130/80 mmHg. Lipid reduction therapy with goal LDL-C < 55 mg/dL. Aspirin  81mg  by mouth daily. Atorvastatin  40-80mg  PO QD (or other high intensity statin therapy).  Plan bilateral lower extremity angiogram with possible intervention via left common femoral approach in cath lab.    CHIEF COMPLAINT: Ulcers of bilateral feet  HISTORY OF PRESENT ILLNESS: Joshua Riley is a 88 y.o. male known to our service for end-stage renal disease.  He is undergone a left thigh AV graft in 2023 which he is using for dialysis without issue.  He has developed ischemic ulceration of the feet bilaterally.  These are very painful to him.  This is associated with swelling.  Overall it looks like a mixed venous and arterial ulcer picture.  I reviewed the rationale for angiography and the limb threatening nature of this problem.  Past Medical History:  Diagnosis Date   A-fib (HCC)    Abscess of groin, left 07/07/2023   Anemia    Arthritis    Cancer (HCC)    Basal cell   COVID-19    2021   Dysrhythmia    Afib-controlled on eliquis    ESRD (end stage renal disease) (HCC) 10/22/2021   Glaucoma 11/18/2021   History of DVT (deep vein thrombosis)    History of small bowel obstruction 02/11/2024   Hydronephrosis    managed wtih a PCN   Idiopathic neuropathy 10/22/2021   lyrica     Ileostomy in place [facility])    Obstructive uropathy    With chronic left nephrostomy   Old retinal detachment, total or subtotal    Orthostatic hypotension 10/22/2021   Seizure like activity 11/28/2023   Sleep apnea    does not need a machine   Small bowel obstruction (HCC) 01/25/2024   Stroke (HCC)     Ulcerative colitis (HCC)    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    Past Surgical History:  Procedure Laterality Date   BASAL CELL CARCINOMA EXCISION     10/23   COLON SURGERY     creation of j pouch     and subsequent takedown of j pouch   EYE SURGERY     IR CATHETER TUBE CHANGE  12/18/2023   IR CATHETER TUBE CHANGE  02/19/2024   IR CATHETER TUBE CHANGE  02/19/2024   IR CATHETER TUBE CHANGE  05/05/2024   IR NEPHROSTOMY EXCHANGE LEFT  12/10/2021   IR NEPHROSTOMY EXCHANGE LEFT  04/22/2022   IR NEPHROSTOMY EXCHANGE LEFT  07/29/2022   IR NEPHROSTOMY EXCHANGE LEFT  10/28/2022   IR NEPHROSTOMY EXCHANGE LEFT  02/05/2023   IR NEPHROSTOMY EXCHANGE LEFT  05/07/2023   IR NEPHROSTOMY EXCHANGE LEFT  06/25/2023   IR NEPHROSTOMY EXCHANGE LEFT  07/17/2023   IR NEPHROSTOMY EXCHANGE LEFT  10/22/2023   IR RADIOLOGIST EVAL & MGMT  11/25/2023   IR RADIOLOGIST EVAL & MGMT  12/18/2023   IR RADIOLOGIST EVAL & MGMT  02/19/2024   IR RADIOLOGIST EVAL & MGMT  04/04/2024   IR RADIOLOGIST EVAL & MGMT  05/05/2024   IR THROMBECTOMY AV FISTULA W/THROMBOLYSIS/PTA INC/SHUNT/IMG LEFT Left 02/23/2024   IR US  GUIDE VASC ACCESS LEFT  02/23/2024   LEFT HEART CATH AND  CORONARY ANGIOGRAPHY N/A 07/22/2022   Procedure: LEFT HEART CATH AND CORONARY ANGIOGRAPHY;  Surgeon: Jordan, Peter M, MD;  Location: [facility] INVASIVE CV LAB;  Service: Cardiovascular;  Laterality: N/A;   REVISION OF ARTERIOVENOUS GORETEX GRAFT Left 05/06/2022   Procedure: REDO LEFT THIGH ARTERIOVENOUS 4-7 MM GORETEX GRAFT;  Surgeon: Eliza Lonni RAMAN, MD;  Location: [facility] OR;  Service: Vascular;  Laterality: Left;   SMALL INTESTINE SURGERY     TOTAL COLECTOMY      Family History  Problem Relation Age of Onset   Stroke Mother    Cancer Father    Esophageal cancer Brother     Social History   Socioeconomic History   Marital status: Widowed    Spouse name: Not on file   Number of children: Not on file   Years of education: Not on file    Highest education level: 12th grade  Occupational History   Not on file  Tobacco Use   Smoking status: Former    Current packs/day: 0.00    Average packs/day: 2.0 packs/day for 6.0 years (12.0 ttl pk-yrs)    Types: Cigarettes    Start date: 46    Quit date: 57    Years since quitting: 40.8    Passive exposure: Never   Smokeless tobacco: Never  Vaping Use   Vaping status: Never Used  Substance and Sexual Activity   Alcohol use: Yes    Alcohol/week: 5.0 standard drinks of alcohol    Types: 5 Shots of liquor per week    Comment: socially   Drug use: Never   Sexual activity: Not Currently  Other Topics Concern   Not on file  Social History Narrative   Widowed- lost wife of 13 years to pancreatic cancer- previously married 43 years. Son in [place] and daughter helping in [place]. 4 grandkids.       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       Hobbies: dinner with family- occasional martini   Social Drivers of Corporate Investment Banker Strain: Low Risk  (10/26/2023)   Overall Financial Resource Strain (CARDIA)    Difficulty of Paying Living Expenses: Not hard at all  Food Insecurity: No Food Insecurity (04/18/2024)   Hunger Vital Sign    Worried About Running Out of Food in the Last Year: Never true    Ran Out of Food in the Last Year: Never true  Transportation Needs: No Transportation Needs (04/18/2024)   PRAPARE - Administrator, Civil Service (Medical): No    Lack of Transportation (Non-Medical): No  Physical Activity: Insufficiently Active (10/26/2023)   Exercise Vital Sign    Days of Exercise per Week: 1 day    Minutes of Exercise per Session: 10 min  Stress: Stress Concern Present (10/26/2023)   [employer] of Occupational Health - Occupational Stress Questionnaire    Feeling of Stress : To some extent  Social Connections: Patient Declined (04/18/2024)   Social Connection and Isolation Panel    Frequency of Communication  with Friends and Family: Patient declined    Frequency of Social Gatherings with Friends and Family: Patient declined    Attends Religious Services: Patient declined    Active Member of Clubs or Organizations: Patient declined    Attends Banker Meetings: Patient declined    Marital Status: Patient declined  Recent Concern: Social Connections - Socially Isolated (01/27/2024)   Social Connection and Isolation Panel    Frequency  of Communication with Friends and Family: More than three times a week    Frequency of Social Gatherings with Friends and Family: More than three times a week    Attends Religious Services: Never    Database Administrator or Organizations: No    Attends Banker Meetings: Never    Marital Status: Widowed  Intimate Partner Violence: Not At Risk (04/18/2024)   Humiliation, Afraid, Rape, and Kick questionnaire    Fear of Current or Ex-Partner: No    Emotionally Abused: No    Physically Abused: No    Sexually Abused: No    Allergies  Allergen Reactions   Baclofen Other (See Comments)    Altered mental status, after accidental overdose     Cephalosporins Rash   Ciprofloxacin Itching and Rash   Wound Dressing Adhesive Rash    Current Outpatient Medications  Medication Sig Dispense Refill   atorvastatin  (LIPITOR) 20 MG tablet Take 1 tablet (20 mg total) by mouth daily. 90 tablet 0   calcitRIOL  (ROCALTROL ) 0.5 MCG capsule Take 1 capsule (0.5 mcg total) by mouth every Monday, Wednesday, and Friday with hemodialysis.     Cyanocobalamin  (VITAMIN B-12 PO) Take 3,000 mcg by mouth in the morning and at bedtime. Gummies     dorzolamide  (TRUSOPT ) 2 % ophthalmic solution Place 1 drop into the right eye at bedtime.     ELIQUIS  2.5 MG TABS tablet Take 1 tablet (2.5 mg total) by mouth 2 (two) times daily. 60 tablet 11   ertapenem  1 g in sodium chloride  0.9 % 100 mL Inject 1 g into the vein every Monday, Wednesday, and Friday at 6 PM. End date:  04/29/2024.     fludrocortisone  (FLORINEF ) 0.1 MG tablet Take 1 tablet (0.1 mg total) by mouth daily. 90 tablet 0   folic acid  (FOLVITE ) 1 MG tablet Take 1 tablet (1 mg total) by mouth daily. 30 tablet 0   gabapentin  (NEURONTIN ) 300 MG capsule TAKE 1 CAPSULE BY MOUTH ONCE A DAY AT BEDTIME FOR NEUROPATHY 90 capsule 3   hydrocortisone  (CORTEF ) 10 MG tablet Take 1 tablet (10 mg total) by mouth every evening. (Patient taking differently: Take 10 mg by mouth See admin instructions. Take with 20 mg for a total of 30 mg in the evening) 30 tablet 2   hydrocortisone  (CORTEF ) 20 MG tablet Take 1 tablet (20 mg total) by mouth daily. (Patient taking differently: Take 20 mg by mouth See admin instructions. Take with 10 mg for a total of 30 mg in the evening) 30 tablet 2   midodrine  (PROAMATINE ) 10 MG tablet Take 3 tablets (30 mg total) by mouth 3 (three) times daily. 270 tablet 1   mometasone -formoterol  (DULERA ) 200-5 MCG/ACT AERO Inhale 2 puffs into the lungs 2 (two) times daily. 1 each 5   multivitamin (RENA-VIT) TABS tablet Take 1 tablet by mouth daily.     Omega Fatty Acids-Vitamins (OMEGA-3 GUMMIES) CHEW Chew 1,200 mg by mouth in the morning and at bedtime.     pantoprazole  (PROTONIX ) 40 MG tablet Take 1 tablet (40 mg total) by mouth 2 (two) times daily. (Patient taking differently: Take 40 mg by mouth 2 (two) times daily as needed (for acid reflux).) 60 tablet 1   ramelteon  (ROZEREM ) 8 MG tablet Take 1 tablet (8 mg total) by mouth at bedtime. 30 tablet 5   Sodium Chloride  Flush (SALINE FLUSH) 0.9 % SOLN Use 5 mLs by Intracatheter route daily as directed. 150 mL 1   timolol  (  TIMOPTIC ) 0.5 % ophthalmic solution Place 1 drop into both eyes in the morning and at bedtime.     traMADol  (ULTRAM ) 50 MG tablet Take 1 tablet (50 mg total) by mouth every 12 (twelve) hours as needed for moderate pain (pain score 4-6) or severe pain (pain score 7-10). 6 tablet 0   TYLENOL  325 MG tablet Take 325-650 mg by mouth every 6  (six) hours as needed for mild pain (pain score 1-3) or headache.     No current facility-administered medications for this visit.    PHYSICAL EXAM Vitals:   05/17/24 1233  BP: (!) 87/60  Pulse: (!) 106  Temp: 97.7 F (36.5 C)  SpO2: 98%  Weight: 162 lb (73.5 kg)  Height: 5' 8 (1.727 m)   Chronically ill elderly man in no distress Regular rate and rhythm Unlabored breathing Left foot with ischemic ulceration of the MTP joint of the 2nd and 3rd digit. Right foot with ischemic ulceration of the 2nd and 3rd toe as well as the medial malleolus and lateral foot.   No palpable pedal pulses There is resolving venous ulceration of the pretibial skin bilaterally  PERTINENT LABORATORY AND RADIOLOGIC DATA  Most recent CBC    Latest Ref Rng & Units 04/23/2024    8:14 AM 04/20/2024    8:07 AM 04/18/2024    7:25 AM  CBC  WBC 4.0 - 10.5 K/uL 10.6  9.6  9.2   Hemoglobin 13.0 - 17.0 g/dL 9.1  89.6  89.9   Hematocrit 39.0 - 52.0 % 28.3  31.4  31.6   Platelets 150 - 400 K/uL 177  150  120      Most recent CMP    Latest Ref Rng & Units 04/23/2024    8:00 AM 04/20/2024    8:07 AM 04/19/2024    4:13 AM  CMP  Glucose 70 - 99 mg/dL 890  872  866   BUN 8 - 23 mg/dL 95  82  48   Creatinine 0.61 - 1.24 mg/dL 2.22  1.94  4.26   Sodium 135 - 145 mmol/L 132  135  134   Potassium 3.5 - 5.1 mmol/L 4.9  4.7  5.5   Chloride 98 - 111 mmol/L 97  96  95   CO2 22 - 32 mmol/L 18  22  22    Calcium  8.9 - 10.3 mg/dL 8.8  9.1  8.8     Renal function CrCl cannot be calculated (Patient's most recent lab result is older than the maximum 21 days allowed.).  Hgb A1c MFr Bld (%)  Date Value  11/09/2023 5.9 (H)    LDL Cholesterol  Date Value Ref Range Status  11/10/2023 15 0 - 99 mg/dL Final    Comment:           Total Cholesterol/HDL:CHD Risk Coronary Heart Disease Risk Table                     Men   Women  1/2 Average Risk   3.4   3.3  Average Risk       5.0   4.4  2 X Average Risk   9.6    7.1  3 X Average Risk  23.4   11.0        Use the calculated Patient Ratio above and the CHD Risk Table to determine the patient's CHD Risk.        ATP III CLASSIFICATION (LDL):  <100  mg/dL   Optimal  899-870  mg/dL   Near or Above                    Optimal  130-159  mg/dL   Borderline  839-810  mg/dL   High  >809     mg/dL   Very High Performed at [facility], [address], [place], [place] [address]      LOWER EXTREMITY DOPPLER STUDY   Patient Name:  Joshua Riley  Date of Exam:   05/13/2024  Medical Rec #: [id number]        Accession #:    [id number]  Date of Birth: [birth year]        Patient Gender: M  Patient Age:   39 years  Exam Location:  [address]  Procedure:      VAS US  ABI WITH/WO TBI  Referring Phys: MARCUS DUDA    ---------------------------------------------------------------------------  -----    Indications: Ulceration bilaterally..   High Risk Factors: Past history of smoking.     Limitations: Today's exam was limited due to bandages and involuntary  patient              movement.   Comparison Study: No prior exam.   Performing Technologist: Devere Dark RVT     Examination Guidelines: A complete evaluation includes at minimum, Doppler  waveform signals and systolic blood pressure reading at the level of  bilateral  brachial, anterior tibial, and posterior tibial arteries, when vessel  segments  are accessible. Bilateral testing is considered an integral part of a  complete  examination. Photoelectric Plethysmograph (PPG) waveforms and toe systolic  pressure readings are included as required and additional duplex testing  as  needed. Limited examinations for reoccurring indications may be performed  as  noted.     ABI Findings:  +---------+------------------+-----+---------+--------+  Right   Rt Pressure (mmHg)IndexWaveform Comment   +---------+------------------+-----+---------+--------+  Brachial 67                                         +---------+------------------+-----+---------+--------+  PTA     107               1.60 biphasic           +---------+------------------+-----+---------+--------+  DP      254               3.79 triphasic          +---------+------------------+-----+---------+--------+  Great Toe27                0.40 Abnormal           +---------+------------------+-----+---------+--------+   +---------+------------------+-----+---------+------------+  Left    Lt Pressure (mmHg)IndexWaveform Comment       +---------+------------------+-----+---------+------------+  Brachial 57                                            +---------+------------------+-----+---------+------------+  PTA     254               3.79 triphasic              +---------+------------------+-----+---------+------------+  DP      254               3.79 triphasic              +---------+------------------+-----+---------+------------+  Great Toe                                not obtained  +---------+------------------+-----+---------+------------+   +-------+-----------+----------------+------------+------------+  ABI/TBIToday's ABIToday's TBI     Previous ABIPrevious TBI  +-------+-----------+----------------+------------+------------+  Right [place]         0.40                                      +-------+-----------+----------------+------------+------------+  Left  [place]         Unable to obtain                          +-------+-----------+----------------+------------+------------+           Summary:  Right: Resting right ankle-brachial index indicates noncompressible right  lower extremity arteries. The right toe-brachial index is abnormal.    Left: Resting left ankle-brachial index indicates noncompressible left  lower extremity arteries. Unble to obtain toe-brachial index due to  excessive movement.     *See table(s)  above for measurements and observations.     Suggest Peripheral Vascular Consult.   Electronically signed by Norman Serve on 05/13/2024 at 3:50:46 PM.   Debby SAILOR. Magda, MD [facility] Phone Number: [phone] 05/17/2024 4:57 PM   Total time spent on preparing this encounter including chart review, data review, collecting history, examining the patient, and coordinating care: 60 min  Portions of this report may have been transcribed using voice recognition software.  Every effort has been made to ensure accuracy; however, inadvertent computerized transcription errors may still be present.

## 2024-05-17 NOTE — H&P (View-Only) (Signed)
 [facility] [place]  ASSESSMENT / PLAN: Joshua Riley is a 88 y.o. 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  Recommend:  Abstinence from all tobacco products. Blood glucose control with goal A1c < 7%. Blood pressure control with goal blood pressure < 130/80 mmHg. Lipid reduction therapy with goal LDL-C < 55 mg/dL. Aspirin  81mg  by mouth daily. Atorvastatin  40-80mg  PO QD (or other high intensity statin therapy).  Plan bilateral lower extremity angiogram with possible intervention via left common femoral approach in cath lab.    CHIEF COMPLAINT: Ulcers of bilateral feet  HISTORY OF PRESENT ILLNESS: Joshua Riley is a 88 y.o. male known to our service for end-stage renal disease.  He is undergone a left thigh AV graft in 2023 which he is using for dialysis without issue.  He has developed ischemic ulceration of the feet bilaterally.  These are very painful to him.  This is associated with swelling.  Overall it looks like a mixed venous and arterial ulcer picture.  I reviewed the rationale for angiography and the limb threatening nature of this problem.  Past Medical History:  Diagnosis Date   A-fib (HCC)    Abscess of groin, left 07/07/2023   Anemia    Arthritis    Cancer (HCC)    Basal cell   COVID-19    2021   Dysrhythmia    Afib-controlled on eliquis    ESRD (end stage renal disease) (HCC) 10/22/2021   Glaucoma 11/18/2021   History of DVT (deep vein thrombosis)    History of small bowel obstruction 02/11/2024   Hydronephrosis    managed wtih a PCN   Idiopathic neuropathy 10/22/2021   lyrica     Ileostomy in place [facility])    Obstructive uropathy    With chronic left nephrostomy   Old retinal detachment, total or subtotal    Orthostatic hypotension 10/22/2021   Seizure like activity 11/28/2023   Sleep apnea    does not need a machine   Small bowel obstruction (HCC) 01/25/2024   Stroke (HCC)     Ulcerative colitis (HCC)    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    Past Surgical History:  Procedure Laterality Date   BASAL CELL CARCINOMA EXCISION     10/23   COLON SURGERY     creation of j pouch     and subsequent takedown of j pouch   EYE SURGERY     IR CATHETER TUBE CHANGE  12/18/2023   IR CATHETER TUBE CHANGE  02/19/2024   IR CATHETER TUBE CHANGE  02/19/2024   IR CATHETER TUBE CHANGE  05/05/2024   IR NEPHROSTOMY EXCHANGE LEFT  12/10/2021   IR NEPHROSTOMY EXCHANGE LEFT  04/22/2022   IR NEPHROSTOMY EXCHANGE LEFT  07/29/2022   IR NEPHROSTOMY EXCHANGE LEFT  10/28/2022   IR NEPHROSTOMY EXCHANGE LEFT  02/05/2023   IR NEPHROSTOMY EXCHANGE LEFT  05/07/2023   IR NEPHROSTOMY EXCHANGE LEFT  06/25/2023   IR NEPHROSTOMY EXCHANGE LEFT  07/17/2023   IR NEPHROSTOMY EXCHANGE LEFT  10/22/2023   IR RADIOLOGIST EVAL & MGMT  11/25/2023   IR RADIOLOGIST EVAL & MGMT  12/18/2023   IR RADIOLOGIST EVAL & MGMT  02/19/2024   IR RADIOLOGIST EVAL & MGMT  04/04/2024   IR RADIOLOGIST EVAL & MGMT  05/05/2024   IR THROMBECTOMY AV FISTULA W/THROMBOLYSIS/PTA INC/SHUNT/IMG LEFT Left 02/23/2024   IR US  GUIDE VASC ACCESS LEFT  02/23/2024   LEFT HEART CATH AND  CORONARY ANGIOGRAPHY N/A 07/22/2022   Procedure: LEFT HEART CATH AND CORONARY ANGIOGRAPHY;  Surgeon: Jordan, Peter M, MD;  Location: [facility] INVASIVE CV LAB;  Service: Cardiovascular;  Laterality: N/A;   REVISION OF ARTERIOVENOUS GORETEX GRAFT Left 05/06/2022   Procedure: REDO LEFT THIGH ARTERIOVENOUS 4-7 MM GORETEX GRAFT;  Surgeon: Eliza Lonni RAMAN, MD;  Location: [facility] OR;  Service: Vascular;  Laterality: Left;   SMALL INTESTINE SURGERY     TOTAL COLECTOMY      Family History  Problem Relation Age of Onset   Stroke Mother    Cancer Father    Esophageal cancer Brother     Social History   Socioeconomic History   Marital status: Widowed    Spouse name: Not on file   Number of children: Not on file   Years of education: Not on file    Highest education level: 12th grade  Occupational History   Not on file  Tobacco Use   Smoking status: Former    Current packs/day: 0.00    Average packs/day: 2.0 packs/day for 6.0 years (12.0 ttl pk-yrs)    Types: Cigarettes    Start date: 46    Quit date: 57    Years since quitting: 40.8    Passive exposure: Never   Smokeless tobacco: Never  Vaping Use   Vaping status: Never Used  Substance and Sexual Activity   Alcohol use: Yes    Alcohol/week: 5.0 standard drinks of alcohol    Types: 5 Shots of liquor per week    Comment: socially   Drug use: Never   Sexual activity: Not Currently  Other Topics Concern   Not on file  Social History Narrative   Widowed- lost wife of 13 years to pancreatic cancer- previously married 43 years. Son in [place] and daughter helping in [place]. 4 grandkids.       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       Hobbies: dinner with family- occasional martini   Social Drivers of Corporate Investment Banker Strain: Low Risk  (10/26/2023)   Overall Financial Resource Strain (CARDIA)    Difficulty of Paying Living Expenses: Not hard at all  Food Insecurity: No Food Insecurity (04/18/2024)   Hunger Vital Sign    Worried About Running Out of Food in the Last Year: Never true    Ran Out of Food in the Last Year: Never true  Transportation Needs: No Transportation Needs (04/18/2024)   PRAPARE - Administrator, Civil Service (Medical): No    Lack of Transportation (Non-Medical): No  Physical Activity: Insufficiently Active (10/26/2023)   Exercise Vital Sign    Days of Exercise per Week: 1 day    Minutes of Exercise per Session: 10 min  Stress: Stress Concern Present (10/26/2023)   [employer] of Occupational Health - Occupational Stress Questionnaire    Feeling of Stress : To some extent  Social Connections: Patient Declined (04/18/2024)   Social Connection and Isolation Panel    Frequency of Communication  with Friends and Family: Patient declined    Frequency of Social Gatherings with Friends and Family: Patient declined    Attends Religious Services: Patient declined    Active Member of Clubs or Organizations: Patient declined    Attends Banker Meetings: Patient declined    Marital Status: Patient declined  Recent Concern: Social Connections - Socially Isolated (01/27/2024)   Social Connection and Isolation Panel    Frequency  of Communication with Friends and Family: More than three times a week    Frequency of Social Gatherings with Friends and Family: More than three times a week    Attends Religious Services: Never    Database Administrator or Organizations: No    Attends Banker Meetings: Never    Marital Status: Widowed  Intimate Partner Violence: Not At Risk (04/18/2024)   Humiliation, Afraid, Rape, and Kick questionnaire    Fear of Current or Ex-Partner: No    Emotionally Abused: No    Physically Abused: No    Sexually Abused: No    Allergies  Allergen Reactions   Baclofen Other (See Comments)    Altered mental status, after accidental overdose     Cephalosporins Rash   Ciprofloxacin Itching and Rash   Wound Dressing Adhesive Rash    Current Outpatient Medications  Medication Sig Dispense Refill   atorvastatin  (LIPITOR) 20 MG tablet Take 1 tablet (20 mg total) by mouth daily. 90 tablet 0   calcitRIOL  (ROCALTROL ) 0.5 MCG capsule Take 1 capsule (0.5 mcg total) by mouth every Monday, Wednesday, and Friday with hemodialysis.     Cyanocobalamin  (VITAMIN B-12 PO) Take 3,000 mcg by mouth in the morning and at bedtime. Gummies     dorzolamide  (TRUSOPT ) 2 % ophthalmic solution Place 1 drop into the right eye at bedtime.     ELIQUIS  2.5 MG TABS tablet Take 1 tablet (2.5 mg total) by mouth 2 (two) times daily. 60 tablet 11   ertapenem  1 g in sodium chloride  0.9 % 100 mL Inject 1 g into the vein every Monday, Wednesday, and Friday at 6 PM. End date:  04/29/2024.     fludrocortisone  (FLORINEF ) 0.1 MG tablet Take 1 tablet (0.1 mg total) by mouth daily. 90 tablet 0   folic acid  (FOLVITE ) 1 MG tablet Take 1 tablet (1 mg total) by mouth daily. 30 tablet 0   gabapentin  (NEURONTIN ) 300 MG capsule TAKE 1 CAPSULE BY MOUTH ONCE A DAY AT BEDTIME FOR NEUROPATHY 90 capsule 3   hydrocortisone  (CORTEF ) 10 MG tablet Take 1 tablet (10 mg total) by mouth every evening. (Patient taking differently: Take 10 mg by mouth See admin instructions. Take with 20 mg for a total of 30 mg in the evening) 30 tablet 2   hydrocortisone  (CORTEF ) 20 MG tablet Take 1 tablet (20 mg total) by mouth daily. (Patient taking differently: Take 20 mg by mouth See admin instructions. Take with 10 mg for a total of 30 mg in the evening) 30 tablet 2   midodrine  (PROAMATINE ) 10 MG tablet Take 3 tablets (30 mg total) by mouth 3 (three) times daily. 270 tablet 1   mometasone -formoterol  (DULERA ) 200-5 MCG/ACT AERO Inhale 2 puffs into the lungs 2 (two) times daily. 1 each 5   multivitamin (RENA-VIT) TABS tablet Take 1 tablet by mouth daily.     Omega Fatty Acids-Vitamins (OMEGA-3 GUMMIES) CHEW Chew 1,200 mg by mouth in the morning and at bedtime.     pantoprazole  (PROTONIX ) 40 MG tablet Take 1 tablet (40 mg total) by mouth 2 (two) times daily. (Patient taking differently: Take 40 mg by mouth 2 (two) times daily as needed (for acid reflux).) 60 tablet 1   ramelteon  (ROZEREM ) 8 MG tablet Take 1 tablet (8 mg total) by mouth at bedtime. 30 tablet 5   Sodium Chloride  Flush (SALINE FLUSH) 0.9 % SOLN Use 5 mLs by Intracatheter route daily as directed. 150 mL 1   timolol  (  TIMOPTIC ) 0.5 % ophthalmic solution Place 1 drop into both eyes in the morning and at bedtime.     traMADol  (ULTRAM ) 50 MG tablet Take 1 tablet (50 mg total) by mouth every 12 (twelve) hours as needed for moderate pain (pain score 4-6) or severe pain (pain score 7-10). 6 tablet 0   TYLENOL  325 MG tablet Take 325-650 mg by mouth every 6  (six) hours as needed for mild pain (pain score 1-3) or headache.     No current facility-administered medications for this visit.    PHYSICAL EXAM Vitals:   05/17/24 1233  BP: (!) 87/60  Pulse: (!) 106  Temp: 97.7 F (36.5 C)  SpO2: 98%  Weight: 162 lb (73.5 kg)  Height: 5' 8 (1.727 m)   Chronically ill elderly man in no distress Regular rate and rhythm Unlabored breathing Left foot with ischemic ulceration of the MTP joint of the 2nd and 3rd digit. Right foot with ischemic ulceration of the 2nd and 3rd toe as well as the medial malleolus and lateral foot.   No palpable pedal pulses There is resolving venous ulceration of the pretibial skin bilaterally  PERTINENT LABORATORY AND RADIOLOGIC DATA  Most recent CBC    Latest Ref Rng & Units 04/23/2024    8:14 AM 04/20/2024    8:07 AM 04/18/2024    7:25 AM  CBC  WBC 4.0 - 10.5 K/uL 10.6  9.6  9.2   Hemoglobin 13.0 - 17.0 g/dL 9.1  89.6  89.9   Hematocrit 39.0 - 52.0 % 28.3  31.4  31.6   Platelets 150 - 400 K/uL 177  150  120      Most recent CMP    Latest Ref Rng & Units 04/23/2024    8:00 AM 04/20/2024    8:07 AM 04/19/2024    4:13 AM  CMP  Glucose 70 - 99 mg/dL 890  872  866   BUN 8 - 23 mg/dL 95  82  48   Creatinine 0.61 - 1.24 mg/dL 2.22  1.94  4.26   Sodium 135 - 145 mmol/L 132  135  134   Potassium 3.5 - 5.1 mmol/L 4.9  4.7  5.5   Chloride 98 - 111 mmol/L 97  96  95   CO2 22 - 32 mmol/L 18  22  22    Calcium  8.9 - 10.3 mg/dL 8.8  9.1  8.8     Renal function CrCl cannot be calculated (Patient's most recent lab result is older than the maximum 21 days allowed.).  Hgb A1c MFr Bld (%)  Date Value  11/09/2023 5.9 (H)    LDL Cholesterol  Date Value Ref Range Status  11/10/2023 15 0 - 99 mg/dL Final    Comment:           Total Cholesterol/HDL:CHD Risk Coronary Heart Disease Risk Table                     Men   Women  1/2 Average Risk   3.4   3.3  Average Risk       5.0   4.4  2 X Average Risk   9.6    7.1  3 X Average Risk  23.4   11.0        Use the calculated Patient Ratio above and the CHD Risk Table to determine the patient's CHD Risk.        ATP III CLASSIFICATION (LDL):  <100  mg/dL   Optimal  899-870  mg/dL   Near or Above                    Optimal  130-159  mg/dL   Borderline  839-810  mg/dL   High  >809     mg/dL   Very High Performed at [facility], [address], [place], [place] [address]      LOWER EXTREMITY DOPPLER STUDY   Patient Name:  Joshua Riley  Date of Exam:   05/13/2024  Medical Rec #: [id number]        Accession #:    [id number]  Date of Birth: [birth year]        Patient Gender: M  Patient Age:   39 years  Exam Location:  [address]  Procedure:      VAS US  ABI WITH/WO TBI  Referring Phys: MARCUS DUDA    ---------------------------------------------------------------------------  -----    Indications: Ulceration bilaterally..   High Risk Factors: Past history of smoking.     Limitations: Today's exam was limited due to bandages and involuntary  patient              movement.   Comparison Study: No prior exam.   Performing Technologist: Devere Dark RVT     Examination Guidelines: A complete evaluation includes at minimum, Doppler  waveform signals and systolic blood pressure reading at the level of  bilateral  brachial, anterior tibial, and posterior tibial arteries, when vessel  segments  are accessible. Bilateral testing is considered an integral part of a  complete  examination. Photoelectric Plethysmograph (PPG) waveforms and toe systolic  pressure readings are included as required and additional duplex testing  as  needed. Limited examinations for reoccurring indications may be performed  as  noted.     ABI Findings:  +---------+------------------+-----+---------+--------+  Right   Rt Pressure (mmHg)IndexWaveform Comment   +---------+------------------+-----+---------+--------+  Brachial 67                                         +---------+------------------+-----+---------+--------+  PTA     107               1.60 biphasic           +---------+------------------+-----+---------+--------+  DP      254               3.79 triphasic          +---------+------------------+-----+---------+--------+  Great Toe27                0.40 Abnormal           +---------+------------------+-----+---------+--------+   +---------+------------------+-----+---------+------------+  Left    Lt Pressure (mmHg)IndexWaveform Comment       +---------+------------------+-----+---------+------------+  Brachial 57                                            +---------+------------------+-----+---------+------------+  PTA     254               3.79 triphasic              +---------+------------------+-----+---------+------------+  DP      254               3.79 triphasic              +---------+------------------+-----+---------+------------+  Great Toe                                not obtained  +---------+------------------+-----+---------+------------+   +-------+-----------+----------------+------------+------------+  ABI/TBIToday's ABIToday's TBI     Previous ABIPrevious TBI  +-------+-----------+----------------+------------+------------+  Right [place]         0.40                                      +-------+-----------+----------------+------------+------------+  Left  [place]         Unable to obtain                          +-------+-----------+----------------+------------+------------+           Summary:  Right: Resting right ankle-brachial index indicates noncompressible right  lower extremity arteries. The right toe-brachial index is abnormal.    Left: Resting left ankle-brachial index indicates noncompressible left  lower extremity arteries. Unble to obtain toe-brachial index due to  excessive movement.     *See table(s)  above for measurements and observations.     Suggest Peripheral Vascular Consult.   Electronically signed by Norman Serve on 05/13/2024 at 3:50:46 PM.   Debby SAILOR. Magda, MD [facility] Phone Number: [phone] 05/17/2024 4:57 PM   Total time spent on preparing this encounter including chart review, data review, collecting history, examining the patient, and coordinating care: 60 min  Portions of this report may have been transcribed using voice recognition software.  Every effort has been made to ensure accuracy; however, inadvertent computerized transcription errors may still be present.

## 2024-05-19 ENCOUNTER — Other Ambulatory Visit: Payer: Self-pay

## 2024-05-19 ENCOUNTER — Encounter: Payer: Self-pay | Admitting: Internal Medicine

## 2024-05-19 ENCOUNTER — Encounter: Payer: Self-pay | Admitting: Family Medicine

## 2024-05-19 ENCOUNTER — Ambulatory Visit (INDEPENDENT_AMBULATORY_CARE_PROVIDER_SITE_OTHER): Admitting: Internal Medicine

## 2024-05-19 VITALS — BP 80/57 | HR 74 | Temp 97.0°F | Resp 16

## 2024-05-19 DIAGNOSIS — Z1612 Extended spectrum beta lactamase (ESBL) resistance: Secondary | ICD-10-CM

## 2024-05-19 DIAGNOSIS — L0291 Cutaneous abscess, unspecified: Secondary | ICD-10-CM

## 2024-05-19 DIAGNOSIS — K611 Rectal abscess: Secondary | ICD-10-CM

## 2024-05-19 NOTE — Progress Notes (Signed)
 Patient complained of leaking ostomy. Recommended they call PCP for referral to wound ostomy nurse. They also have questions about drain in left back. Provided phone number for drain clinic.   Boyde Grieco, BSN, RN

## 2024-05-19 NOTE — Op Note (Incomplete)
 DATE OF SERVICE: 05/20/2024  PATIENT:  Joshua Riley  88 y.o. male  PRE-OPERATIVE DIAGNOSIS:  Atherosclerosis of native arteries of bilateral lower extremities causing ulceration  POST-OPERATIVE DIAGNOSIS:  Same  PROCEDURE:   1) Ultrasound guided left common femoral artery access  2) Aortogram  3) Right lower extremity angiogram with second order cannulation  4) Left lower extremity angiogram 5) Conscious sedation (33 minutes) (CPT 99152)  SURGEON:  Debby SAILOR. Magda, MD  ASSISTANT: none  ANESTHESIA:   local and IV sedation  ESTIMATED BLOOD LOSS: min  LOCAL MEDICATIONS USED:  LIDOCAINE    COUNTS: confirmed correct.  PATIENT DISPOSITION:  PACU - hemodynamically stable.   Delay start of Pharmacological VTE agent (>24hrs) due to surgical blood loss or risk of bleeding: no  INDICATION FOR PROCEDURE: Joshua Riley is a 88 y.o. male known to our service for end-stage renal disease.  He is undergone a left thigh AV graft in 2023 which he is using for dialysis without issue.  He has developed ischemic ulceration of the feet bilaterally.  These are very painful to him.  This is associated with swelling.  Overall it looks like a mixed venous and arterial ulcer picture.  I reviewed the rationale for angiography and the limb threatening nature of this problem. . After careful discussion of risks, benefits, and alternatives the patient was offered angiogram. The patient understood and wished to proceed.  OPERATIVE FINDINGS:   Aortogram: Renal arteries Left: patent  Right: patent Infrarenal aorta: patent Common iliac arteries: Left: highly tortuous, patent  Right: highly tortuous, patent Internal iliac arteries: Left: patent  Right: patent External iliac arteries: Left: patent  Right: patent  Right Lower Extremity Angiogram:  Common femoral artery: patent  Profunda femoris artery: patent  Superficial femoral artery: patent  Popliteal artery: patent  Anterior tibial artery:  occluded  Tibioperoneal trunk: patent  Peroneal artery: patent  Posterior tibial artery: patent  Pedal circulation: disadvantaged  Left Lower Extremity Angiogram:  Common femoral artery: patent  Profunda femoris artery: patent  Superficial femoral artery: patent  Popliteal artery: patent  Anterior tibial artery: occluded  Tibioperoneal trunk: patent  Peroneal artery: patent  Posterior tibial artery: patent  Pedal circulation: disadvantaged  GLASS score. N/A  WIfI score. Wound: 1; ischemia: 3; infection: 0. Stage: 3  DESCRIPTION OF PROCEDURE: After identification of the patient in the pre-operative holding area, the patient was transferred to the operating room. The patient was positioned supine on the operating room table. Anesthesia was induced. The groins was prepped and draped in standard fashion. A surgical pause was performed confirming correct patient, procedure, and operative location.  The left groin was anesthetized with subcutaneous injection of 1% lidocaine . Using ultrasound guidance, the left common femoral artery was accessed with micropuncture technique. Fluoroscopy was used to confirm cannulation over the femoral head. The 81F micropuncture sheath was upsized to 47F.   A Benson wire was advanced into the distal aorta. Over the wire an omni flush catheter was advanced to the level of L2. Aortogram was performed - see above for details.   The right common iliac artery was selected with an omniflush catheter and benson guidewire. The wire was advanced into the common femoral artery. Over the wire the omni flush catheter was advanced into the external iliac artery. Selective angiography was performed - see above for details.   The sheath was left in place to be removed in the recovery area.  Conscious sedation was administered with the use of IV fentanyl  and midazolam   under continuous physician and nurse monitoring.  Heart rate, blood pressure, and oxygen saturation were  continuously monitored.  Total sedation time was 33 minutes  Upon completion of the case instrument and sharps counts were confirmed correct. The patient was transferred to the PACU in good condition. I was present for all portions of the procedure.  PLAN: OMT for PAD. No options for revascularization. Only option is palliative wound care. May need bilateral major amputations.  Debby SAILOR. Magda, MD [facility] and [facility] Special[facility] of [facility] Phone Number: [phone] 05/20/2024 1:40 PM

## 2024-05-19 NOTE — Progress Notes (Signed)
 Patient ID: Joshua Riley, male   DOB: 08-02-1935, 88 y.o.   MRN: [id number]  HPI [name] is a 88yo M with polymicrobial presacral abscess who Finished iv abtx, doing well. Drain in place, also followed by general surgery. His presacral fluid collection/abscess treated in mid August with oral abtx and drain. However, he was rehospitalized with SIRS after drain dislodged. Imaging showed presacral fluid collection still present, but decreased in size. He had drain replaced on 9/10 by IR. Post drain placement he is back to baseline, blood cultures negative, drain cultures negative thus far, will place him on 10 days of Augmentin  and doxycycline - now completed.  Only complaint is leaking from his ostomy. He thought this was ostomy eval visit  Outpatient Encounter Medications as of 05/19/2024  Medication Sig   atorvastatin  (LIPITOR) 20 MG tablet Take 1 tablet (20 mg total) by mouth daily.   calcitRIOL  (ROCALTROL ) 0.5 MCG capsule Take 1 capsule (0.5 mcg total) by mouth every Monday, Wednesday, and Friday with hemodialysis.   Cyanocobalamin  (VITAMIN B-12 PO) Take 3,000 mcg by mouth in the morning and at bedtime. Gummies   dorzolamide  (TRUSOPT ) 2 % ophthalmic solution Place 1 drop into the right eye at bedtime.   ELIQUIS  2.5 MG TABS tablet Take 1 tablet (2.5 mg total) by mouth 2 (two) times daily.   ertapenem  1 g in sodium chloride  0.9 % 100 mL Inject 1 g into the vein every Monday, Wednesday, and Friday at 6 PM. End date: 04/29/2024.   fludrocortisone  (FLORINEF ) 0.1 MG tablet Take 1 tablet (0.1 mg total) by mouth daily.   folic acid  (FOLVITE ) 1 MG tablet Take 1 tablet (1 mg total) by mouth daily.   gabapentin  (NEURONTIN ) 300 MG capsule TAKE 1 CAPSULE BY MOUTH ONCE A DAY AT BEDTIME FOR NEUROPATHY   hydrocortisone  (CORTEF ) 10 MG tablet Take 1 tablet (10 mg total) by mouth every evening. (Patient taking differently: Take 10 mg by mouth See admin instructions. Take with 20 mg for a total of 30 mg in  the evening)   hydrocortisone  (CORTEF ) 20 MG tablet Take 1 tablet (20 mg total) by mouth daily. (Patient taking differently: Take 20 mg by mouth See admin instructions. Take with 10 mg for a total of 30 mg in the evening)   midodrine  (PROAMATINE ) 10 MG tablet Take 3 tablets (30 mg total) by mouth 3 (three) times daily.   mometasone -formoterol  (DULERA ) 200-5 MCG/ACT AERO Inhale 2 puffs into the lungs 2 (two) times daily.   multivitamin (RENA-VIT) TABS tablet Take 1 tablet by mouth daily.   Omega Fatty Acids-Vitamins (OMEGA-3 GUMMIES) CHEW Chew 1,200 mg by mouth in the morning and at bedtime.   pantoprazole  (PROTONIX ) 40 MG tablet Take 1 tablet (40 mg total) by mouth 2 (two) times daily. (Patient taking differently: Take 40 mg by mouth 2 (two) times daily as needed (for acid reflux).)   ramelteon  (ROZEREM ) 8 MG tablet Take 1 tablet (8 mg total) by mouth at bedtime.   Sodium Chloride  Flush (SALINE FLUSH) 0.9 % SOLN Use 5 mLs by Intracatheter route daily as directed.   timolol  (TIMOPTIC ) 0.5 % ophthalmic solution Place 1 drop into both eyes in the morning and at bedtime.   traMADol  (ULTRAM ) 50 MG tablet Take 1 tablet (50 mg total) by mouth every 12 (twelve) hours as needed for moderate pain (pain score 4-6) or severe pain (pain score 7-10).   TYLENOL  325 MG tablet Take 325-650 mg by mouth every 6 (six) hours as needed  for mild pain (pain score 1-3) or headache.   No facility-administered encounter medications on file as of 05/19/2024.     Patient Active Problem List   Diagnosis Date Noted   Pressure injury of skin of multiple topographic sites 04/19/2024   Bilateral leg edema 04/12/2024   Obstructive uropathy    Presacral Abscess 02/22/2024   Adrenal insufficiency 02/11/2024   Asthma 02/11/2024   Cerebrovascular disease 12/02/2023   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   Anastomotic leak of intestine 07/14/2023   History of anemia due to chronic kidney disease 07/08/2023   Macrocytic anemia  04/08/2023   Depression 04/08/2023   Abnormal bladder CT 03/23/2023   Infection due to ESBL-producing Escherichia coli 03/23/2023   DOE (dyspnea on exertion) 07/22/2022   History of sepsis 06/02/2022   Ulcerative colitis (HCC) - S/p total colectomy. From his Care Everywhere records, this appears to have been performed in [place] in June 2011 06/02/2022   Acute metabolic encephalopathy 06/02/2022   End-stage renal disease on hemodialysis (HCC) 05/06/2022   Glaucoma 11/18/2021   Paroxysmal atrial fibrillation (HCC) 11/18/2021   Orthostatic hypotension 10/22/2021   Idiopathic neuropathy 10/22/2021   Colostomy status (HCC) -  Care Everywhere records reviewed. States pt had total colectomy performed in June 2011 in [place] . 10/22/2021   Nephrostomy present (HCC) - left kidney. Care Everywhere records reviewed. appears to bave been ordered by Dr. Dasie Clark in [place] . placed [phone] 10/22/2021     There are no preventive care reminders to display for this patient.   Review of Systems 12 point ros is otherwise negative Physical Exam   BP (!) 80/57   Pulse 74   Temp (!) 97 F (36.1 C) (Temporal)   Resp 16   SpO2 99%    Gen = a x o by 3 in nad Pulm = CTAB no W/C/R Card= RRR n g/m/r Abd = ostomy in place, no erythema  CBC Lab Results  Component Value Date   WBC 10.6 (H) 04/23/2024   RBC 2.47 (L) 04/23/2024   HGB 9.1 (L) 04/23/2024   HCT 28.3 (L) 04/23/2024   PLT 177 04/23/2024   MCV 114.6 (H) 04/23/2024   MCH 36.8 (H) 04/23/2024   MCHC 32.2 04/23/2024   RDW 17.6 (H) 04/23/2024   LYMPHSABS 0.7 04/20/2024   MONOABS 0.8 04/20/2024   EOSABS 0.1 04/20/2024    BMET Lab Results  Component Value Date   NA 132 (L) 04/23/2024   K 4.9 04/23/2024   CL 97 (L) 04/23/2024   CO2 18 (L) 04/23/2024   GLUCOSE 109 (H) 04/23/2024   BUN 95 (H) 04/23/2024   CREATININE 7.77 (H) 04/23/2024   CALCIUM  8.8 (L) 04/23/2024   GFRNONAA 6 (L) 04/23/2024      Assessment and  Plan  Polymicrobial including ESBL infection for presacral fluid collection/ abscess = completed course of treatment. Doesn't need follow up but will see if pcp to refer to ostomy

## 2024-05-20 ENCOUNTER — Encounter (HOSPITAL_COMMUNITY): Admission: RE | Disposition: A | Payer: Self-pay | Attending: Vascular Surgery

## 2024-05-20 ENCOUNTER — Other Ambulatory Visit: Payer: Self-pay

## 2024-05-20 ENCOUNTER — Ambulatory Visit (HOSPITAL_COMMUNITY)
Admission: RE | Admit: 2024-05-20 | Discharge: 2024-05-20 | Disposition: A | Discharge status: Home / Self Care | Attending: Vascular Surgery | Admitting: Vascular Surgery

## 2024-05-20 DIAGNOSIS — I70235 Atherosclerosis of native arteries of right leg with ulceration of other part of foot: Secondary | ICD-10-CM | POA: Insufficient documentation

## 2024-05-20 DIAGNOSIS — Z7982 Long term (current) use of aspirin: Secondary | ICD-10-CM | POA: Diagnosis not present

## 2024-05-20 DIAGNOSIS — L97519 Non-pressure chronic ulcer of other part of right foot with unspecified severity: Secondary | ICD-10-CM | POA: Insufficient documentation

## 2024-05-20 DIAGNOSIS — L97529 Non-pressure chronic ulcer of other part of left foot with unspecified severity: Secondary | ICD-10-CM | POA: Diagnosis not present

## 2024-05-20 DIAGNOSIS — N186 End stage renal disease: Secondary | ICD-10-CM | POA: Insufficient documentation

## 2024-05-20 DIAGNOSIS — I70213 Atherosclerosis of native arteries of extremities with intermittent claudication, bilateral legs: Secondary | ICD-10-CM | POA: Diagnosis present

## 2024-05-20 DIAGNOSIS — I70245 Atherosclerosis of native arteries of left leg with ulceration of other part of foot: Secondary | ICD-10-CM | POA: Insufficient documentation

## 2024-05-20 DIAGNOSIS — Z87891 Personal history of nicotine dependence: Secondary | ICD-10-CM | POA: Insufficient documentation

## 2024-05-20 DIAGNOSIS — Z79899 Other long term (current) drug therapy: Secondary | ICD-10-CM | POA: Insufficient documentation

## 2024-05-20 DIAGNOSIS — Z992 Dependence on renal dialysis: Secondary | ICD-10-CM | POA: Insufficient documentation

## 2024-05-20 LAB — POCT I-STAT, CHEM 8
BUN: 74 mg/dL — ABNORMAL HIGH (ref 8–23)
Calcium, Ion: 1.2 mmol/L (ref 1.15–1.40)
Chloride: 102 mmol/L (ref 98–111)
Creatinine, Ser: 9.4 mg/dL — ABNORMAL HIGH (ref 0.61–1.24)
Glucose, Bld: 120 mg/dL — ABNORMAL HIGH (ref 70–99)
HCT: 26 % — ABNORMAL LOW (ref 39.0–52.0)
Hemoglobin: 8.8 g/dL — ABNORMAL LOW (ref 13.0–17.0)
Potassium: 4.5 mmol/L (ref 3.5–5.1)
Sodium: 136 mmol/L (ref 135–145)
TCO2: 22 mmol/L (ref 22–32)

## 2024-05-20 SURGERY — ABDOMINAL AORTOGRAM W/LOWER EXTREMITY
Anesthesia: LOCAL

## 2024-05-20 MED ORDER — SODIUM CHLORIDE 0.9% FLUSH: 3.0000 mL | INTRAVENOUS | Status: DC | PRN

## 2024-05-20 MED ORDER — FENTANYL CITRATE (PF) 100 MCG/2ML IJ SOLN
INTRAMUSCULAR | Status: DC | PRN
  Administered 2024-05-20: 12.5 ug via INTRAVENOUS

## 2024-05-20 MED ORDER — MIDAZOLAM HCL 2 MG/2ML IJ SOLN
INTRAMUSCULAR | Status: AC
  Filled 2024-05-20: qty 2

## 2024-05-20 MED ORDER — MIDAZOLAM HCL (PF) 2 MG/2ML IJ SOLN
INTRAMUSCULAR | Status: DC | PRN
Start: 2024-05-20 — End: 2024-05-20
  Administered 2024-05-20: .5 mg via INTRAVENOUS

## 2024-05-20 MED ORDER — SODIUM CHLORIDE 0.9 % IV SOLN: 250.0000 mL | INTRAVENOUS | Status: DC | PRN

## 2024-05-20 MED ORDER — ONDANSETRON HCL 4 MG/2ML IJ SOLN: 4.0000 mg | Freq: Four times a day (QID) | INTRAMUSCULAR | Status: DC | PRN

## 2024-05-20 MED ORDER — ACETAMINOPHEN 325 MG PO TABS: 650.0000 mg | ORAL_TABLET | ORAL | Status: DC | PRN

## 2024-05-20 MED ORDER — HEPARIN (PORCINE) IN NACL 1000-0.9 UT/500ML-% IV SOLN
INTRAVENOUS | Status: DC | PRN
  Administered 2024-05-20 (×2): 500 mL

## 2024-05-20 MED ORDER — LIDOCAINE HCL (PF) 1 % IJ SOLN
INTRAMUSCULAR | Status: AC
  Filled 2024-05-20: qty 30

## 2024-05-20 MED ORDER — LABETALOL HCL 5 MG/ML IV SOLN: 10.0000 mg | INTRAVENOUS | Status: DC | PRN

## 2024-05-20 MED ORDER — LIDOCAINE HCL (PF) 1 % IJ SOLN
INTRAMUSCULAR | Status: DC | PRN
  Administered 2024-05-20: 10 mL

## 2024-05-20 MED ORDER — FENTANYL CITRATE (PF) 100 MCG/2ML IJ SOLN
INTRAMUSCULAR | Status: AC
  Filled 2024-05-20: qty 2

## 2024-05-20 MED ORDER — SODIUM CHLORIDE 0.9% FLUSH: 3.0000 mL | Freq: Two times a day (BID) | INTRAVENOUS | Status: DC

## 2024-05-20 MED ORDER — MIDODRINE HCL 5 MG PO TABS
30.0000 mg | ORAL_TABLET | Freq: Three times a day (TID) | ORAL | Status: DC
  Administered 2024-05-20: 30 mg via ORAL
  Filled 2024-05-20: qty 6

## 2024-05-20 MED ORDER — IODIXANOL 320 MG/ML IV SOLN
INTRAVENOUS | Status: DC | PRN
Start: 2024-05-20 — End: 2024-05-20
  Administered 2024-05-20: 80 mL

## 2024-05-20 MED ORDER — HYDRALAZINE HCL 20 MG/ML IJ SOLN: 5.0000 mg | INTRAMUSCULAR | Status: DC | PRN

## 2024-05-20 SURGICAL SUPPLY — 15 items
CATH NAVICROSS ST .035X90CM (MICROCATHETER) IMPLANT
CATH OMNI FLUSH 5F 65CM (CATHETERS) IMPLANT
GUIDEWIRE ANGLED .035 180CM (WIRE) IMPLANT
KIT MICROPUNCTURE NIT STIFF (SHEATH) IMPLANT
KIT PV (KITS) ×1 IMPLANT
KIT SINGLE USE MANIFOLD (KITS) IMPLANT
KIT SYRINGE INJ CVI SPIKEX1 (MISCELLANEOUS) IMPLANT
SET ATX-X65L (MISCELLANEOUS) IMPLANT
SHEATH PINNACLE 5F 10CM (SHEATH) IMPLANT
SHEATH PROBE COVER 6X72 (BAG) IMPLANT
SYR MEDRAD MARK 7 150ML (SYRINGE) ×1 IMPLANT
TRANSDUCER W/STOPCOCK (MISCELLANEOUS) ×1 IMPLANT
TRAY PV CATH (CUSTOM PROCEDURE TRAY) ×1 IMPLANT
WIRE AMPLATZ SS-J .035X180CM (WIRE) IMPLANT
WIRE BENTSON .035X145CM (WIRE) IMPLANT

## 2024-05-20 NOTE — Progress Notes (Signed)
 Site area: RFAx1 Site Prior to Removal:  Level 0 Pressure Applied For: 20 minutes Manual:   yes Patient Status During Pull:  stable Post Pull Site:  Level 0 Post Pull Instructions Given: yes  Post Pull Pulses Present: palpable Dressing Applied:  tegaderm and gauze Bedrest begins @ 1225 Comments: pulled by [employer] 2H RN

## 2024-05-20 NOTE — Progress Notes (Signed)
 Discharge instructions reviewed with patient and 24 hr person Sissy at the bedside denies questions or concerns. PT ambulated int he room using a walker no s/s of complications at the incision site. PT escorted from the unit via wheel chair to personal vehicle.

## 2024-05-20 NOTE — Discharge Instructions (Signed)
 Femoral Site Care This sheet gives you information about how to care for yourself after your procedure. Your health care provider may also give you more specific instructions. If you have problems or questions, contact your health care provider. What can I expect after the procedure?  After the procedure, it is common to have: Bruising that usually fades within 1-2 weeks. Tenderness at the site. Follow these instructions at home: Wound care Follow instructions from your health care provider about how to take care of your insertion site. Make sure you: Wash your hands with soap and water before you change your bandage (dressing). If soap and water are not available, use hand sanitizer. Remove your dressing as told by your health care provider. In 24 hours Do not take baths, swim, or use a hot tub until your health care provider approves. You may shower 24-48 hours after the procedure or as told by your health care provider. Gently wash the site with plain soap and water. Pat the area dry with a clean towel. Do not rub the site. This may cause bleeding. Do not apply powder or lotion to the site. Keep the site clean and dry. Check your femoral site every day for signs of infection. Check for: Redness, swelling, or pain. Fluid or blood. Warmth. Pus or a bad smell. Activity For the first 2-3 days after your procedure, or as long as directed: Avoid climbing stairs as much as possible. Do not squat. Do not lift anything that is heavier than 10 lb (4.5 kg), or the limit that you are told, until your health care provider says that it is safe. For 5 days Rest as directed. Avoid sitting for a long time without moving. Get up to take short walks every 1-2 hours. Do not drive for 24 hours if you were given a medicine to help you relax (sedative). General instructions Take over-the-counter and prescription medicines only as told by your health care provider. Keep all follow-up visits as told by  your health care provider. This is important. Contact a health care provider if you have: A fever or chills. You have redness, swelling, or pain around your insertion site. Get help right away if: The catheter insertion area swells very fast. You pass out. You suddenly start to sweat or your skin gets clammy. The catheter insertion area is bleeding, and the bleeding does not stop when you hold steady pressure on the area. The area near or just beyond the catheter insertion site becomes pale, cool, tingly, or numb. These symptoms may represent a serious problem that is an emergency. Do not wait to see if the symptoms will go away. Get medical help right away. Call your local emergency services (911 in the [place] Do not drive yourself to the hospital. Summary After the procedure, it is common to have bruising that usually fades within 1-2 weeks. Check your femoral site every day for signs of infection. Do not lift anything that is heavier than 10 lb (4.5 kg), or the limit that you are told, until your health care provider says that it is safe. This information is not intended to replace advice given to you by your health care provider. Make sure you discuss any questions you have with your health care provider. Document Revised: 07/06/2017 Document Reviewed: 07/06/2017 Elsevier Patient Education  2020 [employer].

## 2024-05-20 NOTE — Interval H&P Note (Signed)
 History and Physical Interval Note:  05/20/2024 1:41 PM  [name]  has presented today for surgery, with the diagnosis of atherosclerosis bilateral with ulcer.  The various methods of treatment have been discussed with the patient and family. After consideration of risks, benefits and other options for treatment, the patient has consented to  Procedure(s): ABDOMINAL AORTOGRAM W/LOWER EXTREMITY (N/A) Lower Extremity Angiography (N/A) LOWER EXTREMITY INTERVENTION (N/A) as a surgical intervention.  The patient's history has been reviewed, patient examined, no change in status, stable for surgery.  I have reviewed the patient's chart and labs.  Questions were answered to the patient's satisfaction.     Debby LOISE Robertson

## 2024-05-23 ENCOUNTER — Encounter (HOSPITAL_COMMUNITY): Payer: Self-pay | Admitting: Vascular Surgery

## 2024-05-23 ENCOUNTER — Other Ambulatory Visit: Payer: Self-pay | Admitting: General Surgery

## 2024-05-23 DIAGNOSIS — Z936 Other artificial openings of urinary tract status: Secondary | ICD-10-CM

## 2024-05-24 ENCOUNTER — Ambulatory Visit (HOSPITAL_COMMUNITY)
Admission: RE | Admit: 2024-05-24 | Discharge: 2024-05-24 | Disposition: A | Source: Ambulatory Visit | Attending: Family Medicine | Admitting: Family Medicine

## 2024-05-24 DIAGNOSIS — Z432 Encounter for attention to ileostomy: Secondary | ICD-10-CM

## 2024-05-24 DIAGNOSIS — Z932 Ileostomy status: Secondary | ICD-10-CM | POA: Diagnosis present

## 2024-05-24 DIAGNOSIS — L24B3 Irritant contact dermatitis related to fecal or urinary stoma or fistula: Secondary | ICD-10-CM

## 2024-05-24 NOTE — Discharge Instructions (Signed)
 Apply FLonase (2 squirts on finger) to red skin Stoma powder  SKin prep Barrier ring to crease at 3 o'clock Rest of ring around stoma USe new pattern Barrier strips to edge

## 2024-05-24 NOTE — Progress Notes (Signed)
 [facility]   Reason for visit:  RLQ ileostomy with peristomal breakdown.  Has paid caregiver with him today.  Patient is independent with ostomy care, but she assists as needed.  HPI:  Ulcerative colitis Past Medical History:  Diagnosis Date   A-fib (HCC)    Abscess of groin, left 07/07/2023   Anemia    Arthritis    Cancer (HCC)    Basal cell   COVID-19    2021   Dysrhythmia    Afib-controlled on eliquis    ESRD (end stage renal disease) (HCC) 10/22/2021   Glaucoma 11/18/2021   History of DVT (deep vein thrombosis)    History of small bowel obstruction 02/11/2024   Hydronephrosis    managed wtih a PCN   Idiopathic neuropathy 10/22/2021   lyrica     Ileostomy in place [facility])    Obstructive uropathy    With chronic left nephrostomy   Old retinal detachment, total or subtotal    Orthostatic hypotension 10/22/2021   Seizure like activity 11/28/2023   Sleep apnea    does not need a machine   Small bowel obstruction (HCC) 01/25/2024   Stroke (HCC)    Ulcerative colitis (HCC)    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 90 tablet 0    calcitRIOL  (ROCALTROL ) 0.5 MCG capsule Take 1 capsule (0.5 mcg total) by mouth every Monday, Wednesday, and Friday with hemodialysis.      Cyanocobalamin  (VITAMIN B-12 PO) Take 3,000 mcg by mouth in the morning and at bedtime. Gummies      dorzolamide  (TRUSOPT ) 2 % ophthalmic solution Place 1 drop into the right eye at bedtime.       ELIQUIS  2.5 MG TABS tablet Take 1 tablet (2.5 mg total) by mouth 2 (two) times daily. 60 tablet 11    ertapenem  1 g in sodium chloride  0.9 % 100 mL Inject 1 g into the vein every Monday, Wednesday, and Friday at 6 PM. End date: 04/29/2024.      fludrocortisone  (FLORINEF ) 0.1 MG tablet Take 1 tablet (0.1 mg total) by mouth daily. 90 tablet 0    folic acid  (FOLVITE ) 1 MG tablet Take 1 tablet (1 mg total) by mouth daily. 30 tablet 0    gabapentin  (NEURONTIN ) 300 MG capsule TAKE 1 CAPSULE BY MOUTH ONCE A DAY AT BEDTIME FOR NEUROPATHY 90 capsule 3    hydrocortisone  (CORTEF ) 10 MG tablet Take 1 tablet (10 mg total) by mouth every evening. (Patient taking differently: Take 10 mg by mouth See admin instructions. Take with 20 mg for a total of 30 mg in the evening) 30 tablet 2    hydrocortisone  (CORTEF ) 20 MG tablet Take 1 tablet (20 mg total) by mouth daily. (Patient taking differently: Take 20 mg by mouth See admin instructions. Take with 10 mg for a total of 30 mg in the evening) 30 tablet 2    midodrine  (PROAMATINE ) 10 MG tablet Take 3  tablets (30 mg total) by mouth 3 (three) times daily. 270 tablet 1    mometasone -formoterol  (DULERA ) 200-5 MCG/ACT AERO Inhale 2 puffs into the lungs 2 (two) times daily. 1 each 5    multivitamin (RENA-VIT) TABS tablet Take 1 tablet by mouth daily.      Omega Fatty Acids-Vitamins (OMEGA-3 GUMMIES) CHEW Chew 1,200 mg by mouth in the morning and at bedtime.      pantoprazole  (PROTONIX ) 40 MG tablet Take 1 tablet (40 mg total) by mouth 2 (two) times daily. (Patient taking differently: Take 40 mg by mouth 2 (two) times daily as needed (for acid reflux).) 60 tablet 1    ramelteon  (ROZEREM ) 8 MG tablet Take 1 tablet (8 mg total) by mouth at bedtime. 30 tablet 5    Sodium Chloride  Flush (SALINE FLUSH) 0.9 % SOLN Use 5 mLs by Intracatheter route daily as directed. 150 mL 1    timolol  (TIMOPTIC ) 0.5 % ophthalmic solution Place 1 drop into both eyes in the morning and at bedtime.       traMADol  (ULTRAM ) 50 MG tablet Take 1 tablet (50 mg total) by mouth every 12 (twelve) hours as needed for moderate pain (pain score 4-6) or severe pain (pain score 7-10). 6 tablet 0    TYLENOL  325 MG tablet Take 325-650 mg by mouth every 6 (six) hours as needed for mild pain (pain score 1-3) or headache.      No current facility-administered medications for this encounter.   ROS  Review of Systems  Constitutional: Negative.   HENT:  Positive for hearing loss.        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  Respiratory: Negative.    Cardiovascular:        Hx, Atrial fib  Gastrointestinal:        Ulcerative colitis Ileostomy   Endocrine:       ESRD  Musculoskeletal:  Positive for gait problem.       Uses wheelchair in office  Skin:  Positive for color change and rash.       Irritant contact dermatitis  Neurological:  Positive for weakness.  Psychiatric/Behavioral: Negative.    All other systems reviewed and are negative.  Vital signs:  BP 90/62   Pulse 82   Temp (!) 97.1 F (36.2 C) (Oral)   Resp 18   SpO2 100%  Exam:  Physical Exam Vitals reviewed.  Constitutional:      Appearance: Normal appearance.  HENT:     Mouth/Throat:     Mouth: Mucous membranes are moist.  Eyes:     Comments: Hx retinol detachment Glaucoma  Cardiovascular:     Rate and Rhythm: Normal rate.  Abdominal:     Palpations: Abdomen is soft.  Skin:    General: Skin is warm and dry.     Findings: Erythema present.  Neurological:     Mental Status: He is alert and oriented to person, place, and time. Mental status is at baseline.     Comments: Hx stroke  Psychiatric:        Mood and Affect: Mood normal.        Behavior: Behavior normal.     Stoma type/location:  RLQ ileostomy Stomal assessment/size:  1 3/8 budded pink and moist Peristomal assessment:  creasing at 3 o'clock and distal to stoma Circumferential erythema in pouching area from leaks and frequent pouch changes Treatment  options for stomal/peristomal skin: stoma powder and skin prep  barrier ring to  creasing at 3 o'clock.   Will cut barrier at lower edge to raise barrier above level of distal creasing.  This should improve seal. Patient does not want to change pouch or add ostomy belt.  Output: liquid yellow stool  Ostomy pouching: 2pc. 2 3/4 pouch, convex with barrier ring flonase to reddened intact skin Powder and skin prep to denuded peristomal skin  Education provided:  Stop desitin to irritation as this is contributing to pouch losing seal.  Instead, try FLonase nasal spray.  The steroid will aid inflammation and improve skin.  Can stop use once skin is clear    Impression/dx  Ileostomy Irritant contact dermatitis Discussion  Drinks sufficient fluids, appears hydrated.  Cutting barrier off center at bottom of flange to move barrier up and avoid creasing.  Sent home with pattern.  Plan  See back 2 weeks to assess progress/improvement.     Visit time: 55 minutes.   Darice Cooley FNP-BC

## 2024-05-30 ENCOUNTER — Encounter: Payer: Self-pay | Admitting: Vascular Surgery

## 2024-05-31 ENCOUNTER — Telehealth: Payer: Self-pay | Admitting: Family Medicine

## 2024-05-31 NOTE — Telephone Encounter (Signed)
 Ongoing low blood pressure with known dialysis patient- recommend making sure he's asymptomatic and follow up repeat blood pressure to make sure improving

## 2024-05-31 NOTE — Telephone Encounter (Signed)
 Please see message and advise.    Copied from CRM #[id number]. Topic: General - Other >> May 31, 2024  2:31 PM Jasmin G wrote: Reason for CRM: Pt's nurse from [facility] called to report pt's vital signs, bp being at 72/52 and heart rate being at 99. Call nurse if needed at [phone].

## 2024-06-06 ENCOUNTER — Telehealth: Payer: Self-pay

## 2024-06-06 ENCOUNTER — Encounter: Payer: Self-pay | Admitting: Orthopedic Surgery

## 2024-06-06 ENCOUNTER — Telehealth: Payer: Self-pay | Admitting: Family Medicine

## 2024-06-06 NOTE — Telephone Encounter (Signed)
 Pt's daughter stated provider at Dialysis is suggesting urine does not look good. Was unable to get a good sample to test it. They put him on Doxycycline .

## 2024-06-06 NOTE — Telephone Encounter (Signed)
 Spoke with daughter and per Dr. Katrinka:  It is not much that he can do being that he has not seen him but do advise that the pt can have another urine test done at his next appt with dialysis and to continue to take the doxycycline . Daughter understood

## 2024-06-06 NOTE — Telephone Encounter (Signed)
 Noted

## 2024-06-06 NOTE — Telephone Encounter (Signed)
 Jilissa with Home Health called with pt info update. Spoke with On Call provider, Dr. Clotilda Single  Patient Name First: Joshua Riley Last: CARIN Gender: Male DOB: May 12, 1936 Age: 88 Y 11 M 21 D Return Phone Number: [phone] (Primary), [phone] (Secondary) Address: City/ State/ Zip: [place]  [address] Client [facility] Night - Human Resources Officer [facility] Night Provider Katrinka Senior- MD Contact Type Call Call Type Home Care Hospice Page Now Who Is Calling Home Health / Hospice Agency Caller Name Alma Facility Name [facility] Facility Number [phone] Reason for Call Symptomatic Patient Initial Comment Caller states [facility] BP 78/60, feet, hands cold, not able to get oxygen level, wounds are leaking, and modeling in his legs Translation No Disp. Time Titus Time) Disposition Final User 06/03/2024 1:10:56 PM Send to [facility] Paging Queue Silva Maus 06/03/2024 1:15:51 PM Called On-Call Provider Abagail Nian 06/03/2024 1:16:19 PM Page Completed Yes Abagail Nian Final Disposition 06/03/2024 1:16:19 PM Page Completed Yes [facility]ail [facility] Phone DateTime Result/ Outcome Message Type Notes Single Clotilda- MD [phone] 06/03/2024 1:15:51 PM Called On Call Provider - Reached Doctor Paged Single Clotilda- MD 06/03/2024 1:16:13 PM Spoke with On Call - General Message Result Spoke with OC. Relayed information, connected OC wit [facility]

## 2024-06-06 NOTE — Telephone Encounter (Signed)
 Spoke with patients daughter and she feels he may have a UTI, strong odor, grey in color. Wound care nurse coming out tomorrow and will assess wounds. Has an appt at [facility] Thursday for a second opinion on his feet and legs.

## 2024-06-06 NOTE — Telephone Encounter (Signed)
 Per [place] spoke with daughter and is going to have this evaluated at dialysis today

## 2024-06-07 ENCOUNTER — Ambulatory Visit: Admitting: Orthopedic Surgery

## 2024-06-07 ENCOUNTER — Other Ambulatory Visit: Payer: Self-pay | Admitting: Family Medicine

## 2024-06-07 DIAGNOSIS — I70263 Atherosclerosis of native arteries of extremities with gangrene, bilateral legs: Secondary | ICD-10-CM

## 2024-06-07 DIAGNOSIS — I739 Peripheral vascular disease, unspecified: Secondary | ICD-10-CM

## 2024-06-08 ENCOUNTER — Encounter: Payer: Self-pay | Admitting: Orthopedic Surgery

## 2024-06-08 NOTE — Progress Notes (Signed)
 Office Visit Note   Patient: Joshua Riley           Date of Birth: [birth year]           MRN: [id number] Visit Date: 06/07/2024              Requested by: Katrinka Garnette KIDD, MD [address] [place],  [place] [address] PCP: Katrinka Garnette KIDD, MD  Chief Complaint  Patient presents with   Right Leg - Follow-up   Left Leg - Follow-up      HPI: Discussed the use of AI scribe software for clinical note transcription with the patient, who gave verbal consent to proceed.  History of Present Illness Joshua Riley is an 88 year old male with gangrenous changes to his feet who presents for evaluation of potential amputation. He was referred by Dr. Marylouise for evaluation of his lower extremity condition.  He has a history of severe peripheral vascular disease with gangrenous changes to both feet. A vascular surgeon has determined that there are no endovascular revascularization options available. He experiences significant, constant pain in his feet and buttocks.  He is currently receiving wound care at home, which includes daily dressing changes with zero form and gauze, followed by Ace bandage or Coban wrapping. A caregiver assists with these treatments, although the caregiver does not visit daily.  He is scheduled to visit [facility] for further evaluation.  He has a history of adrenal disease and is on dialysis. He also has a history of total colectomy with an ostomy. His caregiver reports using AMD ointment and stoma powder for skin care around the ostomy site, and he has tried Mepolex dressings for pressure relief.     Assessment & Plan: Visit Diagnoses: No diagnosis found.  Plan: Assessment and Plan Assessment & Plan Critical limb ischemia with bilateral gangrene and ischemic ulcers Severe critical limb ischemia with bilateral gangrene and ischemic ulcers. No endovascular revascularization options. High risk of sepsis and mortality if necrotic tissue is not addressed. He is  hesitant about amputation but understands the risks. - Follow up with [facility] on Thursday for further evaluation and potential treatment options. - If [facility] determines no further surgical intervention is possible, plan for bilateral above knee amputation. - Educated on the risks of untreated necrotic tissue, including sepsis and mortality.  Pressure ulcer of buttocks Pressure ulcer on the buttocks, likely due to pressure from ostomy appliances. Mepolex dressings recommended for cushioning and pressure relief. - Continue using Mepolex dressings for cushioning and pressure relief. - Continue current regimen of AMD ointment, nasal spray, and stoma powder.      Follow-Up Instructions: No follow-ups on file.   Ortho Exam  Patient is alert, oriented, no adenopathy, well-dressed, normal affect, normal respiratory effort. Physical Exam EXTREMITIES: Feet cold with gangrenous changes to forefoot and ischemic ulcers of ankle and leg. Leg cold to touch up to mid calf.      Imaging: No results found. No images are attached to the encounter.  Labs: Lab Results  Component Value Date   HGBA1C 5.9 (H) 11/09/2023   ESRSEDRATE 16 04/12/2024   ESRSEDRATE 28 (H) 03/31/2024   ESRSEDRATE 80 (H) 01/25/2024   CRP 1.8 (H) 04/12/2024   CRP 11.5 (H) 03/31/2024   CRP 4.6 (H) 01/25/2024   REPTSTATUS 04/23/2024 FINAL 04/18/2024   GRAMSTAIN  03/16/2024    ABUNDANT WBC PRESENT, PREDOMINANTLY PMN NO ORGANISMS SEEN    CULT  04/18/2024    NO GROWTH 5 DAYS Performed at  [facility], [address], [place], [place] [address]    [facility] STAPHYLOCOCCUS AUREUS 03/16/2024   LABORGA KLEBSIELLA PNEUMONIAE 03/16/2024   LABORGA ESCHERICHIA COLI 03/16/2024   LABORGA STREPTOCOCCUS ANGINOSIS 03/16/2024     Lab Results  Component Value Date   ALBUMIN  3.0 (L) 04/23/2024   ALBUMIN  3.3 (L) 04/20/2024   ALBUMIN  3.3 (L) 04/18/2024    Lab Results  Component Value Date   MG 1.8 01/31/2024   MG 1.9  01/27/2024   MG 1.9 01/26/2024   No results found for: VD25OH  No results found for: PREALBUMIN    Latest Ref Rng & Units 05/20/2024    9:32 AM 04/23/2024    8:14 AM 04/20/2024    8:07 AM  CBC EXTENDED  WBC 4.0 - 10.5 K/uL  10.6  9.6   RBC 4.22 - 5.81 MIL/uL  2.47  2.73   Hemoglobin 13.0 - 17.0 g/dL 8.8  9.1  89.6   HCT 60.9 - 52.0 % 26.0  28.3  31.4   Platelets 150 - 400 K/uL  177  150   NEUT# 1.7 - 7.7 K/uL   7.9   Lymph# 0.7 - 4.0 K/uL   0.7      There is no height or weight on file to calculate BMI.  Orders:  No orders of the defined types were placed in this encounter.  No orders of the defined types were placed in this encounter.    Procedures: No procedures performed  Clinical Data: No additional findings.  ROS:  All other systems negative, except as noted in the HPI. Review of Systems  Objective: Vital Signs: There were no vitals taken for this visit.  Specialty Comments:  No specialty comments available.  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 From his Care Everywhere records, this appears to have been performed in [place] in June 2011 06/02/2022   Acute metabolic  encephalopathy 06/02/2022   End-stage renal disease on hemodialysis (HCC) 05/06/2022   Glaucoma 11/18/2021   Paroxysmal atrial fibrillation (HCC) 11/18/2021   Orthostatic hypotension 10/22/2021   Idiopathic neuropathy 10/22/2021   Colostomy status (HCC) -  Care Everywhere records reviewed. States pt had total colectomy performed in June 2011 in [place] . 10/22/2021   Nephrostomy present (HCC) - left kidney. Care Everywhere records reviewed. appears to bave been ordered by Dr. Dasie Clark in [place] . placed 02-2012 10/22/2021   Past Medical History:  Diagnosis Date   A-fib [facility])    Abscess of groin, left 07/07/2023   Anemia    Arthritis    Cancer (HCC)    Basal cell   COVID-19    2021   Dysrhythmia    Afib-controlled on eliquis    ESRD (end stage  renal disease) (HCC) 10/22/2021   Glaucoma 11/18/2021   History of DVT (deep vein thrombosis)    History of small bowel obstruction 02/11/2024   Hydronephrosis    managed wtih a PCN   Idiopathic neuropathy 10/22/2021   lyrica     Ileostomy in place [facility])    Obstructive uropathy    With chronic left nephrostomy   Old retinal detachment, total or subtotal    Orthostatic hypotension 10/22/2021   Seizure like activity 11/28/2023   Sleep apnea    does not need a machine   Small bowel obstruction (HCC) 01/25/2024   Stroke (HCC)    Ulcerative colitis (HCC)    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    Family History  Problem Relation Age of Onset   Stroke Mother    Cancer Father    Esophageal cancer Brother     Past Surgical History:  Procedure Laterality Date   ABDOMINAL AORTOGRAM W/LOWER EXTREMITY N/A 05/20/2024   Procedure: ABDOMINAL AORTOGRAM W/LOWER EXTREMITY;  Surgeon: Magda Debby SAILOR, MD;  Location: [facility];  Service: Cardiovascular;  Laterality: N/A;   BASAL CELL CARCINOMA EXCISION     10/23   COLON SURGERY     creation of j pouch     and subsequent takedown of j pouch   EYE SURGERY     IR  CATHETER TUBE CHANGE  12/18/2023   IR CATHETER TUBE CHANGE  02/19/2024   IR CATHETER TUBE CHANGE  02/19/2024   IR CATHETER TUBE CHANGE  05/05/2024   IR NEPHROSTOMY EXCHANGE LEFT  12/10/2021   IR NEPHROSTOMY EXCHANGE LEFT  04/22/2022   IR NEPHROSTOMY EXCHANGE LEFT  07/29/2022   IR NEPHROSTOMY EXCHANGE LEFT  10/28/2022   IR NEPHROSTOMY EXCHANGE LEFT  02/05/2023   IR NEPHROSTOMY EXCHANGE LEFT  05/07/2023   IR NEPHROSTOMY EXCHANGE LEFT  06/25/2023   IR NEPHROSTOMY EXCHANGE LEFT  07/17/2023   IR NEPHROSTOMY EXCHANGE LEFT  10/22/2023   IR RADIOLOGIST EVAL & MGMT  11/25/2023   IR RADIOLOGIST EVAL & MGMT  12/18/2023   IR RADIOLOGIST EVAL & MGMT  02/19/2024   IR RADIOLOGIST EVAL & MGMT  04/04/2024   IR RADIOLOGIST EVAL & MGMT  05/05/2024   IR THROMBECTOMY AV FISTULA W/THROMBOLYSIS/PTA INC/SHUNT/IMG LEFT Left 02/23/2024   IR US  GUIDE VASC ACCESS LEFT  02/23/2024   LEFT HEART CATH AND CORONARY ANGIOGRAPHY N/A 07/22/2022   Procedure: LEFT HEART CATH AND CORONARY ANGIOGRAPHY;  Surgeon: Jordan, Peter M, MD;  Location: [facility];  Service: Cardiovascular;  Laterality: N/A;   LOWER EXTREMITY ANGIOGRAPHY N/A 05/20/2024   Procedure: Lower Extremity Angiography;  Surgeon: Magda Debby SAILOR, MD;  Location: [facility];  Service: Cardiovascular;  Laterality: N/A;   LOWER EXTREMITY INTERVENTION N/A 05/20/2024   Procedure: LOWER EXTREMITY INTERVENTION;  Surgeon: Magda Debby SAILOR, MD;  Location: [facility];  Service: Cardiovascular;  Laterality: N/A;   REVISION OF ARTERIOVENOUS GORETEX GRAFT Left 05/06/2022   Procedure: REDO LEFT THIGH ARTERIOVENOUS 4-7 MM GORETEX GRAFT;  Surgeon: Eliza Lonni RAMAN, MD;  Location: [facility] OR;  Service: Vascular;  Laterality: Left;   SMALL INTESTINE SURGERY     TOTAL COLECTOMY     Social History   Occupational History   Not on file  Tobacco Use   Smoking status: Former    Current packs/day: 0.00    Average packs/day: 2.0 packs/day for 6.0 years (12.0 ttl pk-yrs)     Types: Cigarettes  Start date: 75    Quit date: 32    Years since quitting: 40.9    Passive exposure: Never   Smokeless tobacco: Never  Vaping Use   Vaping status: Never Used  Substance and Sexual Activity   Alcohol use: Yes    Alcohol/week: 5.0 standard drinks of alcohol    Types: 5 Shots of liquor per week    Comment: socially   Drug use: Never   Sexual activity: Not Currently

## 2024-06-09 ENCOUNTER — Ambulatory Visit: Admitting: Family Medicine

## 2024-06-09 ENCOUNTER — Ambulatory Visit: Admitting: Orthopedic Surgery

## 2024-06-10 ENCOUNTER — Ambulatory Visit (HOSPITAL_COMMUNITY): Admitting: Nurse Practitioner

## 2024-06-13 ENCOUNTER — Ambulatory Visit: Admitting: Orthopedic Surgery

## 2024-06-14 NOTE — Discharge Summary (Signed)
 "  [facility] Medicine Discharge Summary  Admit Date: 06/09/2024 Discharge Date: 06/24/2024  Admitting Physician: Lynwood Waddell Ip, MD Discharge Physician: ADAM JAYSON KAPLAN, MD  Primary Care Provider: Katrinka Garnette KIDD, MD, Phone [phone]  Discharge Destination: Skilled nursing facility: [facility]  Admission Diagnoses:  Other hypotension [I95.89]  Discharge Diagnoses:  Principal Problem:   Septic shock (CMS/HHS-HCC) Active Problems:   ESRD (end stage renal disease) (CMS/HHS-HCC)   Adrenal insufficiency (HHS-HCC)   Anemia   Bilateral leg edema   Conductive hearing loss   Glaucoma   History of anemia due to chronic kidney disease   Idiopathic neuropathy   Ileostomy status (CMS/HHS-HCC)   Irritant contact dermatitis associated with fecal stoma   Nephrostomy present (CMS/HHS-HCC)   Presence of colostomy (CMS/HHS-HCC)   Nephrostomy status (CMS/HHS-HCC)   Abscess   Obstructive uropathy   Paroxysmal atrial fibrillation (CMS/HHS-HCC)   SIRS (systemic inflammatory response syndrome) (CMS/HHS-HCC) Resolved Problems:   * No resolved hospital problems. *  Primary Diagnosis: Admitted for septic shock secondary to acute complicated Pseudomonas UTI, presacral abscess and bilateral lower extremity wounds  Changes Made (with rationale):  Admission med list was not accurate. Discharge med list created based on what patient was prescribed and taking prior to admission. Tramadol  changed to oxycodone  for severe leg pain Senna and Miralax  started for bowel regimen to prevent opioid induced constipation Statin resumed Trazodone and melatonin started for sleep  To-Do List (incidental findings, follow-up studies, etc.): Vascular surgery clinic for follow up of leg wounds and osteomyelitis  Anticipatory Guidance for Outpatient Care:  Monitor for opioid side effects (drowsiness, constipation) Changing to TTS HD schedule at [facility] R IJ perm cath placed  prior to discharge (L Femoral AVG clotted/nonfunctional) HOPE consult placed to facilitate transition of care to SNF    Results Pending at Discharge:  None Please see phone numbers at end of this summary for lab contact information.   Follow-up/Care Transition Plan: Sched. appts: Future Appointments  Date Time Provider Department Center  07/05/2024 11:00 AM Devona, Corean Jansky, NP [facility]  07/21/2024  9:15 AM Minc, Lucie Houston, MD [facility]    Follow-up info: [facility]- [address] [place]  [id number] [phone]    Devona Corean Jansky, NP [address] [place] [address] [phone]  Follow up   Minc, Lucie Houston, MD [address] [place] [address] [phone]         Allergies/Intolerances:  Allergies  Allergen Reactions   Cephalosporins Rash    Tolerated cefepime  06/2024   Ciprofloxacin Itching and Rash   Baclofen Other (See Comments)    Altered mental status, after accidental overdose  Other Reaction(s): Other (See Comments)  Altered mental status, after accidental overdose     New Adverse Drug Events: none  Medications:     Current Discharge Medication List     START taking these medications      Instructions  atorvastatin  20 MG tablet Refills: 0  Commonly known as: LIPITOR Take 1 tablet (20 mg total) by mouth once daily Last time this was given: 20 mg on June 24, 2024  8:31 AM   epoetin alfa-epbx 4,000 unit/mL injection Refills: 0  Commonly known as: RETACRIT Inject 1 mL (4,000 Units total) subcutaneously every Monday, Wednesday, and Friday Last time this was given: 4,000 Units on June 22, 2024  5:07 PM   melatonin 3 mg tablet Refills: 0  Take 2 tablets (6 mg total) by  mouth at bedtime Last time this was given: 6 mg on June 23, 2024  8:56 PM   oxyCODONE  5 MG immediate release tablet Quantity: 20 tablet Refills: 0  Commonly known as:  ROXICODONE  Take 1 tablet (5 mg total) by mouth every 4 (four) hours as needed for Pain Last time this was given: 10 mg on June 23, 2024  1:34 PM   polyethylene glycol packet Refills: 0  Commonly known as: MIRALAX  Take 1 packet (17 g total) by mouth once daily as needed for Constipation Mix in 4-8ounces of fluid prior to taking.   sennosides 8.6 mg tablet Refills: 0  Commonly known as: SENOKOT Take 2 tablets by mouth 2 (two) times daily   sevelamer  carbonate 800 mg tablet Refills: 0  Commonly known as: RENVELA  Take 1 tablet (800 mg total) by mouth 3 (three) times daily with meals Last time this was given: 800 mg on June 24, 2024  8:30 AM   traZODone 50 MG tablet Refills: 0  Commonly known as: DESYREL Take 1 tablet (50 mg total) by mouth at bedtime Last time this was given: 50 mg on June 23, 2024  8:56 PM       These medications have been CHANGED      Instructions  acetaminophen  325 MG tablet Refills: 0 What changed:  medication strength how much to take when to take this reasons to take this  Commonly known as: TYLENOL  Take 3 tablets (975 mg total) by mouth 3 (three) times daily Last time this was given: 975 mg on June 24, 2024  8:30 AM   midodrine  10 MG tablet Refills: 0 What changed: when to take this  Commonly known as: PROAMATINE  Take 3 tablets (30 mg total) by mouth 3 (three) times daily with meals Last time this was given: 30 mg on June 24, 2024  8:30 AM       CONTINUE taking these medications      Instructions  albuterol  2.5 mg /3 mL (0.083 %) nebulizer solution Refills: 0  Commonly known as: PROVENTIL  Inhale 2.5 mg into the lungs every 6 (six) hours as needed   cyanocobalamin  100 MCG tablet Refills: 0  Commonly known as: VITAMIN B12 Take 1 tablet by mouth once daily   ELIQUIS  2.5 mg tablet Refills: 0 Generic drug: apixaban   Take 2.5 mg by mouth 2 (two) times daily Last time this was given: 2.5 mg on June 22, 2024   8:16 AM   fludrocortisone  0.1 mg tablet Refills: 0  Commonly known as: FLORINEF  Take 1 tablet by mouth once daily Last time this was given: 0.1 mg on June 24, 2024  8:30 AM   gabapentin  300 MG capsule Refills: 0  Commonly known as: NEURONTIN  Take 1 capsule by mouth once daily Last time this was given: 300 mg on June 24, 2024  8:30 AM   hydrocortisone  10 MG tablet Refills: 0  Commonly known as: CORTEF  Take 1 tablet by mouth once daily Take with the 20mg  tablet for total of 30mg  Last time this was given: Ask your nurse or doctor   hydrocortisone  20 MG tablet Refills: 0  Commonly known as: CORTEF  Take 1 tablet by mouth once daily Take with the 10mg  tablet for total of 30mg  Last time this was given: Ask your nurse or doctor   mometasone -formoterol  200-5 mcg/actuation inhaler Refills: 0  Commonly known as: DULERA  Inhale 2 inhalations into the lungs 2 (two) times daily   multivitamin tablet Refills: 0  Take 1  tablet by mouth once daily   nitroGLYcerin  0.4 MG SL tablet Refills: 0  Commonly known as: NITROSTAT  Place 0.4 mg under the tongue every 5 (five) minutes as needed for Chest pain May take up to 3 doses.   omeprazole 20 MG DR capsule Refills: 0  Commonly known as: PriLOSEC Take 1 capsule by mouth once daily   timoloL  maleate 0.5 % ophthalmic solution Refills: 0  Commonly known as: TIMOPTIC  Place 1 drop into both eyes every morning   zinc  gluconate 50 mg tablet Refills: 0  Take 1 tablet by mouth once daily       STOP taking these medications    budesonide  0.5 mg/2 mL nebulizer solution Commonly known as: PULMICORT    calcitRIOL  0.25 MCG capsule Commonly known as: ROCALTROL    dorzolamide  2 % ophthalmic solution Commonly known as: TRUSOPT    fluticasone  propion-salmeteroL 250-50 mcg/dose diskus inhaler Commonly known as: ADVAIR DISKUS   ramelteon  8 mg tablet Commonly known as: ROZEREM    traMADoL  50 mg tablet Commonly known as: ULTRAM           Brief History of Present Illness:  Joshua Riley is a 88 y.o. male with ESRD on iHD, adrenal insufficiency, hypotension on midodrine  30 mg TID, UC/Crohn's s/p total colectomy with ostomy, afib on apixaban , DVT, OSA, sacral pressure injury, left PCN who presented to the [facility] ED from vascular surgery clinic for evaluation of worsening lethargy in the setting of likely UTI. The patient was seen this morning in [facility] clinic as a second opinion for evaluation of his chronic bilateral lower extremity wounds. These wounds were previously evaluated by physicians with orthopedics and vascular surgery services at [facility], who recommended amputation; patient wanted a second opinion. During his appointment, it was noted that he had worsening lethargy, which prompted his daughter to bring him to the [facility] ED. Of note, he had purulent urine output in his PCN and was started on doxycycline  for UTI by his nephrologist on 12/1. The patient reports sacral pain, generalized weakness, difficulty standing for several days prior to presentation. No abdominal pain, knee pain, dizziness/lightheadedness. No chest pain. ED workup notable for hypotension with initial BP 80s/50s; patient received 500 mL IVF and then started on norepinephrine . He was administered vancomycin  and zosyn . Labs notable for Na 133, Cl 94, anion gap 17, lactate 2.8 -> 2.1, ESR 59, CRP 17.23, WBC 22.9, Hgb 9.1. CTAP showed recurrent 6.6 cm presacral abscess; CT of bilateral lower extremities were also done and reviewed by vascular surgery team, who noted no evidence of gas and who feel there is no emergent surgical indication. He is admitted to the ICU for further workup/management. _____________________   Hospital Course by Problem: This is an 88yo man with PMH significant for ESRD on HD, chronic adrenal insufficiency (on chronic steroids), chronic hypotension, IBD s/p colectomy with ostomy, chronic A-fib (on  Eliquis ), history of DVT, OSA, sacral pressure injury, and history of hydronephrosis with left PCN in place who was initially admitted to the ICU on 06/09/24 due to septic shock with Pseudomonas bacteruria, Pseudomonas bacteremia, possibly infected chronic bilateral LE wounds, and pre-sacral abscess. Transferred to hospitalist service on 06/12/24.    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  Has PCN secondary to chronic hydronephrosis exchanged every 62-month, last exchanged on this admission by IR 06/13/2024.  Started on broad-spectrum antibiotics with improvement.  Also required stress dose steroids with background of adrenal insufficiency.   Also of note, he does have known sacral pressure injury with CT A&P at admission showing recurrent presacral abscess measuring up to 6.6cm, and patient s/p IR drainage of abscess on 06/10/24, and with minimal output then subsequently removed by IR on 12/11.  There is also concern for his chronic bilateral LE wounds with MRI imaging this admission showing concern for osteomyelitis of right medial malleolus.   -ID consulted, the patient completed a course of cefepime  1g IV (end date 06/23/24), vancomycin  (end date 06/19/24), and IV metronidazole  (end date 06/19/24).  -Vascular surgery was consulted due to chronic bilateral LE wounds with osteomyelitis, see below.    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 He has known chronic bilateral LE wounds with exposed bone on right malleolus. Per chart review, he has previously been recommended to have amputation by vascular surgery and orthopedics at [facility] in [place], [place].  Vascular surgery consulted, initial plan was for possible amputation this admission. But, given wounds do not appear to have signs of active infection at this time vascular surgery is now not recommending any amputation this admission. Will continue with wound care.   -Continue broad abx as above per ID recs.  -Wound care reengaged, updated recommendations on 12/12 -Continue home Eliquis  2.5mg  po BID and Lipitor 20mg  po once daily.  -Pain control: Tylenol  975mg  po TID and oxycodone  5mg  po q4hr prn pain. Continue home gabapentin  300mg  po once daily.  -Prevalon boots ordered by vascular surgery -Follow up with wound care and vascular surgery were arranged.    #Chronic hypotension #Chronic adrenal insufficiency  Patient has known adrenal insufficiency, takes midodrine  30mg  TID, 0.1mg  fludrocortisone  daily, and hydrocortisone  20mg  qAM and 10mg  qHs. He was started on stress dose steroids in the ICU at admission due to septic shock. Vital since improved, but BP remained soft. Continued home midodrine  30mg  po TID. Continued home fludricortisone 0.1mg  po once daily. Stress dose steroids were weaned off, and patient was transitioned to his home hydrocortisone  po BID dosing.   #Chronic A-fib Patient received IV amiodarone  bolus on 06/10/24 while in the ICU, converted to NSR with slower rates. Amiodarone  was used likely due to low BP. Not currently receiving any rate control meds, heart rate remained controlled. Continued home Eliquis   2.5mg  po BID.    #ESRD on HD  #Anemia of ESRD  #Chronic macrocytic anemia  #Thrombocytopenia  MCV remains elevated in 120's, B12 and folate levels this admission are normal. Platelet count decreased this admission, and at discharge.  Nephrology followed for MWF HD this admission. Nephrology started ESA.    #Neuropathic bilateral LE pain  Continued home gabapentin  300mg  po once daily. APAP and oxycodone  added this admission.    Social Drivers of Health with Concerns   Tobacco Use: Medium Risk (06/09/2024)   Patient History    Smoking  Tobacco Use: Former    Smokeless Tobacco Use: Never  Physical Activity: Insufficiently Active (10/26/2023)   Received from [facility]   Exercise Vital Sign    On average, how many days per week do you engage in moderate to strenuous exercise (like a brisk walk)?: 1 day    On average, how many minutes do you engage in exercise at this level?: 10 min  Stress: Stress Concern Present (10/26/2023)   Received from [facility] - Occupational Stress Questionnaire    Feeling of Stress : To some extent    Surgeries and Procedures Performed:  Temp HD cath placement [place] cath placement  _____________________  Discharge Exam:  BP 107/72   Pulse 67   Temp 36.6 C (97.8 F) (Oral)   Resp 14   Wt 75.8 kg (167 lb 1.7 oz)   SpO2 99%   BMI 25.41 kg/m  O2 Device: Nasal cannula (06/24/24 0942) GEN Awake, alert, cooperative, no acute distress.  Oriented to person, place, time, and situation. Hard of hearing.  HEENT Pupils equal and reactive.  No scleral icterus.  Oropharynx moist.  No oral lesions.  NECK Supple, no lymphadenopathy, no thyromegaly, no jugular venous distention.  CHEST Normal respiratory effort and air movement.  No rales, rhonchi or wheezing. No chest wall tenderness. R chest perm cath.  CARD Regular rhythm.  Normal S1 and S2.  No murmur, gallop or rub appreciated.   ABD Soft, non-tender,  non-distended. Normal bowel sounds.   No guarding or rebound. R sided ostomy with brown stool. L PCN tube with yellow urine.  MSK Decreased muscle bulk  EXT BLE wrapped. Eschar seen on bilateral toes. R femoral vas cath site without bleeding, swelling or tenderness  DERM See media tab.  NEURO Cranial nerves II-XII intact. Strength and sensation are grossly symmetric and intact in upper and lower extremities.  Moves all extremities well.     Pertinent Lab Testing: Recent Labs  Lab 06/19/24 0515 06/20/24 0457 06/21/24 0542  NA 133* 131* 137  K 5.3* 5.9* 5.0  CL 103 99 105  CO2 22 19* 21  BUN 59* 82* 49*  CREATININE 5.2* 6.8* 4.6*  GLUCOSE 124 151* 162*  CALCIUM  8.5* 8.8 8.7   No results for input(s): AST, ALT, ALKPHOS, TBILI in the last 168 hours.   Recent Labs  Lab 06/19/24 0515 06/20/24 0457 06/21/24 0542  WBC 15.8* 14.9* 12.7*  HGB 8.5* 8.8* 8.7*  HCT 28.3* 28.5* 27.9*  PLT 164 189 157   No results for input(s): APTT, INR in the last 168 hours.    Other Pertinent Labs:  Lab Results  Component Value Date   HGBA1C 6.7 (H) 06/11/2024   Lab Results  Component Value Date   VITB12 2,920 (H) 06/13/2024   Lab Results  Component Value Date   FOLATE 14.1 06/13/2024     Micro:  Susceptibility data from last 90 days. Collected Specimen Info Organism Amikacin Cefepime  Ceftazidime Ciprofloxacin Clindamycin  Daptomycin Doxycycline  Erythromycin Levofloxacin Linezolid  Meropenem  Oxacillin Piperacillin  + Tazobactam Tobramycin Trimeth/Sulfa  06/10/24 Other Staphylococcus lugdunensis      S  S  S  S   S   S   S    Mixed gram positive and gram negative organisms                   Anaerobic gram positive rods  06/09/24 Urine from Nephrostomy (PCN), Left Pseudomonas aeruginosa  S  S  I  S      S   R   R  S   06/09/24 Blood from Percutaneous, venous Pseudomonas aeruginosa   S  S  S      S   S   S  S    Collected Specimen Info Organism Vancomycin    06/10/24 Other Staphylococcus lugdunensis  S    Mixed gram positive and gram negative organisms     Anaerobic gram positive rods   06/09/24 Urine from Nephrostomy (PCN), Left Pseudomonas aeruginosa   06/09/24 Blood from Percutaneous, venous Pseudomonas aeruginosa        Pertinent Imaging:   IR central venous catheter placement Result Date: 06/24/2024 Successful placement of a right external jugular vein 23 cm tip to cuff 14.5 French [employer] hemodialysis tunneled central venous catheter. The catheter is ready for use. Uncomplicated removal of right groin temp line.   MRI foot right without contrast Result Date: 06/10/2024 IMPRESSION: 1. Mild cortical irregularity and underlying marrow signal abnormality involving the medial malleolus of the distal right tibia, suspicious for mild osteitis/osteomyelitis changes. No other evidence of osteomyelitis involving the right foot or ankle. 2. Diffuse subcutaneous edema. No loculated fluid collections identified to suggest abscess.    Echo complete Result Date: 06/10/2024 NORMAL LEFT VENTRICULAR SYSTOLIC FUNCTION WITH MILD LVH ESTIMATED EF: >55% INDETERMINATE DIASTOLIC FUNCTION NORMAL RIGHT VENTRICULAR SYSTOLIC FUNCTION VALVULAR REGURGITATION: MILD AR, TRIVIAL MR, TRIVIAL PR, MILD TR                         ESTIMATED RVSP: 35 mmHg (Normal) VALVULAR STENOSIS: MILD AS, No MS, No PS, No TS     CT lower extremity left with contrast Result Date: 06/10/2024 Impression: 1.  Diffuse subcutaneous edema within the visualized lower leg. No deep abscess. 2.  No acute osseous abnormality. No evidence of osteomyelitis. 3.  Patent left thigh AV fistula. However high-grade focal stenosis involving the mid graft as above secondary to circumferential mural thrombus.   CT lower extremity right with contrast Result Date: 06/10/2024 Impression: 1.  Diffuse subcutaneous edema predominantly along the distal lateral leg. No deep fluid collections to suggest an abscess. 2.   No acute osseous abnormality.  X-ray foot right 3 plus views Result Date: 06/09/2024 IMPRESSION: No acute osseous abnormality of the bilateral foot. No radiographic evidence of osteomyelitis.  CT abdomen pelvis with contrast IMPRESSION: 1.  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 2.  Percutaneous left nephrostomy tube in appropriate position. No hydronephrosis. 3.  Small right pleural effusion with associated atelectasis.   X-ray chest single view portable Result Date: 06/09/2024 IMPRESSION: Mildly prominent interstitial opacities bilaterally, which may represent mild interstitial pulmonary edema versus atelectasis. _____________________  Code Status: DNAR Goals of care were not addressed during this admission.   Status on Discharge:  Current activity: Chairfast (06/24/24 0945) Current mobility: Slightly limited (06/24/24 0945)  Activity Recommendation: activity as tolerated  Other Discharge Instructions: Services setup at discharge: None Tubes/lines at discharge: Central dialysis line and Drain: L PCN tube  Diet: Oral Supplements - Adult All Supplements; Novasource Renal-Assorted; Breakfast Diet renal  Wound Care Order Instructions             Wound Care  As directed       Comments: Twice weekly: 1. Cleanse wound with normal saline 2. Apply SilvaSorb Gel (3 oz) #[id number] to wounds 3.  Apply Calcium  Alginate (Aquacel Extra) (4x4) [id number] cut to fit and place on wounds 4. Apply 4 x 4 gauze pieces x 2 5. Cover with ABD 6. Wrap lightly with Kerlix 7. Secure with Medipore Tape  Question:  Body Site  Answer:  All wounds on lower extremities excluding feet      Wound Care  Daily at 0900       Comments: Daily: Cleanse with No-Rise Incontinence Cleanser SAP [id number], gently wipe using blue bath wipes, Pat dry Apply Triad Hydrophilic Paste SAP [id number] to coat denuded skin, buffer moisture buildup. Remove only the soiled layers of paste while cleaning the buttocks and  perianal area.  Reapply PRN to maintain a constant layer of paste  Question:  Body Site  Answer:  Sacrum/Buttocks      Wound Care  As directed       Comments: 1. Cleanse skin tears with normal saline 2. Gently pat dry 3. Apply Vaseline Gauze # [id number] cut to fit wound 4. Silicone Foam Border Dressing [id number] Change twice weekly  Question:  Body Site  Answer:  Skin Tears      Dressing - Change  Daily at 0900                   _____________________  Time spent on discharge process: 45 minutes    ADAM JAYSON KAPLAN, MD  [facility]  06/24/2024   Hospital Contact Information:  [facility]) [facility]) [facility])  Pending tests:  Laboratory: [phone] Microbiology: [phone] Pathology: [phone] Radiology: [phone]  General questions: [phone] Pending tests: Laboratory: [phone] Microbiology: [phone] Pathology: [phone] Radiology: [phone]  General questions:  [phone] Pending tests:  Laboratory: [phone] Microbiology: [phone] Pathology: [phone] Radiology: [phone]  General questions:  [phone] Pending tests: Laboratory: [phone]) [phone] Microbiology: [phone] Pathology: [phone] Radiology: [phone]  General questions:  [phone]    "

## 2024-06-17 ENCOUNTER — Telehealth: Payer: Self-pay | Admitting: Family Medicine

## 2024-06-17 NOTE — Telephone Encounter (Signed)
 Placed in you box for review and signature.

## 2024-06-17 NOTE — Telephone Encounter (Unsigned)
 Copied from CRM #[id number]. Topic: General - Other >> Jun 17, 2024  9:46 AM Tiffini S wrote: Reason for CRM: Patient is in the hospital at [facility] - need to have a form filled out to be moved to the [facility] hospital- FL2 form.   Daughter [name] will drop the form out today- advised up to 3- 10 business days depending on the pcp to complete document- said must have document completed sooner  Please call the patient at [phone] to discuss.

## 2024-06-17 NOTE — Telephone Encounter (Signed)
 I will do my best to complete today- this is interesting though- if he's actively in the hospital- the hospitla team should be able to fill this out  Also happy belated birthday to him!

## 2024-06-17 NOTE — Telephone Encounter (Signed)
 I did my best but as noted patient/family asked that i complete this- typically best completed by hospital team actively caring for patient and placed this on FL2- higher risk for incomplete information if I'm not seeing him in hospital or on hospital follow up- I know we have visit on 16th scheduled- will he even be able to come for that? We could complete then but it sounds to me he should go to skilled nursing per hospital notes and that was my recommendation as well

## 2024-06-17 NOTE — Telephone Encounter (Signed)
 Patient's daughter FL2, to be filled out by provider. Patient requested to send it back via Call Patient to pick up within ASAP. Document is located in providers tray at front office.Please advise at [phone].

## 2024-06-17 NOTE — Telephone Encounter (Signed)
 Spoke with the daughter. Patient is currently in [place]. She would prefer you fill the form out. She can come pick it up Monday before closing.

## 2024-06-20 ENCOUNTER — Telehealth: Payer: Self-pay | Admitting: Family Medicine

## 2024-06-20 NOTE — Telephone Encounter (Signed)
 Spoke with patients daughter, she will be coming to pick up paperwork today. Cancelled appt for 06/21/2024.

## 2024-06-20 NOTE — Telephone Encounter (Signed)
 [facility] faxed Home Health Certificate (Order [place] [id number]), to be filled out by provider. Patient requested to send it back via Fax within ASAP. Document is located in providers tray at front office.Please advise at [phone].

## 2024-06-21 ENCOUNTER — Ambulatory Visit: Admitting: Family Medicine

## 2024-06-22 ENCOUNTER — Other Ambulatory Visit: Payer: Self-pay | Admitting: Family Medicine

## 2024-06-23 NOTE — Progress Notes (Signed)
 [facility]  NOTE    HD unit [phone]  Unit Fax: [phone]  Office [phone]     Referring Physician: Alisa Juliene Laundry, MD (Internal Medicine) Admission Date: 06/09/2024 Hospital day: Hospital Day: 15  Date of Original Consult: 06/09/24 I personally reviewed records including evaluation of patient and counseling:    History of Present Illness: Joshua Riley is a 88 y.o. male with a past medical Hx significant for ESRD MWF, UC, Crohn's disease, HTN / hypotension on midodrine , L PCN who was seen in the ER for unhealing leg wounds and UTI.  Patient came from Vascular clinic concern for non-healing leg wounds and was asked to come to the hospital for further workup and management.  He is a dialysis patient in [place] MWF.  Runs only 2hrs 45 minutes per his request.   Per family at bedside who assist with history primarily, he has been seen by [facility] in the past and advised BKA however they were looking for a second opinion. He also c.o L back pain and noted turbid urine in the bag, he was advised PO doxy for UTI. Says he gets the PCN exchanged intermittently, not recently though. Currently denies NV CP SOB but has been very weak and poor appetite. No fevers.   Reason for Consultation/Chief Complaint:  ESRD; volume and electrolyte management   Last 24hr/Subjective:  Plan to remove Temp line tomorrow and PC will be placed after. ROS: no sob, no chest pain; no nausea or vomiting ; no myalgia  ASSESSMENT AND PLAN   Recent Labs  Lab 06/19/24 0515 06/20/24 0457 06/21/24 0542  BUN 59* 82* 49*  CREATININE 5.2* 6.8* 4.6*  GFR 10 7 12       ESRD:  MWF [facility], [place].   Plan: Continue MWF schedule.  Has LLE AVG. Duration 2:45, limited UF Per reports 2/2 IDH On midodrine     F-160 2K with 2.5Ca Na: Sodium: 135 HCO3: Bicarbonate: 30 Blood Flow Rate (+/- 50 mL/min): 450 Dialysate Flow Rate (mL/min): 800 mL/min Heparin : Not ordered Treatment Time (hours:minutes): 2.45    Pre-Treatment Weight (kg): 76 Patient Fluid Removal Programmed (liters): 1 Last Post-HD weight: 75.5 Last Pre treatment BP:   Last Post treatment BP:   Last treatment fluid removal: 0.5 L Estimated dry weight: Access:     Recent Labs  Lab 06/17/24 0600 06/18/24 0655 06/19/24 0515 06/20/24 0457 06/21/24 0542  NA 134* 138 133* 131* 137  K 5.2* 4.7 5.3* 5.9* 5.0  CL 102 106 103 99 105  CO2 23 24 22  19* 21   Recent Labs  Lab 06/17/24 0600 06/18/24 0655 06/19/24 0515 06/20/24 0457 06/21/24 0542  MG 2.3 2.1 2.0 2.0 1.8    ELECTROLYTES:   Na stable; hyperkalemia; acidosis  Plan: Manage with HD, 2k bath ; low K diet  BP: (96-110)/(62-71) 110/71 MAP (mmHg):  [69-80] 80 SpO2:  [100 %] 100 %  Pulse Readings from Last 1 Encounters:  06/23/24 75    Intake/Output Summary (Last 24 hours) at 06/23/2024 1112 Last data filed at 06/23/2024 1100 Gross per 24 hour  Intake 890 ml  Output 600 ml  Net 290 ml    HYPERTENSION:  BP borderline Plan:  no longer on hypertensive meds here as BP soft.  On Midodrine  for IDH  On Florinef /Hydrocortisone   Recent Labs  Lab 06/19/24 0515 06/20/24 0457 06/21/24 0542  CALCIUM  8.5* 8.8 8.7  PHOS 3.9 4.9* 3.7  ALB 2.2* 2.3* 2.2*   No results found for: PTH, VITD  RENAL OSTEODYSTROPHY:   corrected Ca near goal Plan: continue sevelamer  as phos binder; monitor  Recent Labs  Lab 06/19/24 0515 06/20/24 0457 06/21/24 0542  HGB 8.5* 8.8* 8.7*  PLT 164 189 157    Lab Results  Component Value Date   VITB12 2,920 (H) 06/13/2024     ANEMIA of RENAL DISEASE (goal 10-11):  HGB below goal Plan:   Continue ESA. Monitor Transfuse PRN  NUTRITION:  Diet renal Oral Supplements - Adult All Supplements; Novasource Renal-Assorted; Breakfast DIET NPO Except for: MEDS Effective Midnight   Plan: Add Novasource with meals- once diet started.    Temp (24hrs), Avg:36.6 C (97.9 F), Min:36.4 C (97.6 F), Max:36.7 C (98.1 F)  Recent Labs   Lab 06/19/24 0515 06/20/24 0457 06/21/24 0542  WBC 15.8* 14.9* 12.7*   No results found for: HEP, HEPBSAB, HBCIGM   Lab Results  Component Value Date   BLDCULT Pseudomonas aeruginosa (!) 06/09/2024   BLDCULT No growth detected. 06/09/2024    INFECTIOUS DISEASE:  non-healing leg wounds, RLE dry gangrene abs Pseudomonas bacteremia. S/p PCN exchanged Currently on cefepime  1g IV once daily (end date 06/23/24, dose with HD 2/2/3 g as outpatient),  Plan: No antibiotics with HD  DIALYSIS ACCESS: Temp line currently Plan: plan to place [facility] 12/19   DISCHARGE/PROGNOSIS:  guarded    Outpatient heparin  with HD treatments:  Outpatient HD time:  2hr  Outpatient TW:  70 Hepatitis Status: HBsAg negative on PENDING   ______________________________________________________________________  PHYSICAL EXAM:    Vital Signs: Temp (24hrs), Avg:36.6 C (97.9 F), Min:36.4 C (97.6 F), Max:36.7 C (98.1 F)      Weight: 74.5 kg (164 lb 3.9 oz) Resp: 17    Admission weight: 74.7 kg (164 lb 10.9 oz) (06/09/24 2200) BP Readings from Last 1 Encounters:  06/23/24 110/71    Pulse Readings from Last 1 Encounters:  06/23/24 75       General:  Not in acute distress Pulm:             no rales CV:  Rate regular Abdomen: Soft Access: Left leg graft      MEDICATIONS    ALLERGIES:  Allergies  Allergen Reactions   Cephalosporins Rash    Tolerated cefepime  06/2024   Ciprofloxacin Itching and Rash   Baclofen Other (See Comments)    Altered mental status, after accidental overdose  Other Reaction(s): Other (See Comments)  Altered mental status, after accidental overdose    SCHEDULED MEDICATIONS   acetaminophen , 975 mg, Oral, TID [Held by provider] apixaban , 2.5 mg, Oral, Q12H [facility] atorvastatin , 20 mg, Oral, Daily ceFEPIme  (MAXIPIME ) IV Traditional Infusion, 1 g, Intravenous, Q24H epoetin alfa-epbx, 4,000 Units, Subcutaneous, QMWF fludrocortisone , 0.1 mg, Oral,  Daily gabapentin , 300 mg, Oral, Daily [START ON 06/24/2024] heparin  (porcine), , Intracatheter, As Directed Admin hydrocortisone , 10 mg, Oral, QHS hydrocortisone , 20 mg, Oral, Daily melatonin, 6 mg, Oral, QHS midodrine , 30 mg, Oral, TID CC sevelamer  carbonate, 800 mg, Oral, TID CC traZODone, 50 mg, Oral, QHS     INFUSTIONS         PRN MEDS    albuterol , 2.5 mg, Nebulization, QID PRN dextrose  50% in water, 12.5-25 g, Intravenous, As Directed glucagon, 1 mg, Intramuscular, As Directed lidocaine , 0.5 mL, Subcutaneous, As Directed ondansetron  (PF), 4 mg, Intravenous, Q12H PRN oxyCODONE , 5 mg, Oral, Q4H PRN  Or oxyCODONE , 10 mg, Oral, Q4H PRN       PRIOR TO ADMISSION MEDICATIONS    Prior to Admission Medications  Prescriptions Last Dose Taking?  ELIQUIS  2.5 mg tablet  Yes  Sig: Take 2.5 mg by mouth 2 (two) times daily  acetaminophen  (TYLENOL ) 500 MG tablet  Yes  Sig: Take 1-2 tablets by mouth every 8 (eight) hours as needed for Pain  albuterol  (PROVENTIL ) 2.5 mg /3 mL (0.083 %) nebulizer solution  Yes  Sig: Inhale 2.5 mg into the lungs every 6 (six) hours as needed  budesonide  (PULMICORT ) 0.5 mg/2 mL nebulizer solution  Yes  Sig: TAKE 2 ML (0.5 MG TOTAL) BY NEBULIZATION TWICE A DAY  calcitRIOL  (ROCALTROL ) 0.25 MCG capsule Unknown No  cyanocobalamin  (VITAMIN B12) 100 MCG tablet  Yes  Sig: Take 1 tablet by mouth once daily  dorzolamide  (TRUSOPT ) 2 % ophthalmic solution  Yes  Sig: Place 1 drop into the right eye at bedtime  fludrocortisone  (FLORINEF ) 0.1 mg tablet  Yes  Sig: Take 1 tablet by mouth once daily  fluticasone  propion-salmeteroL (ADVAIR DISKUS) 250-50 mcg/dose diskus inhaler Unknown No  Sig: Inhale 1 Puff into the lungs every 12 (twelve) hours  gabapentin  (NEURONTIN ) 300 MG capsule  Yes  Sig: Take 1 capsule by mouth once daily  hydrocortisone  (CORTEF ) 10 MG tablet  Yes  Sig: Take 1 tablet by mouth once daily Take with the 20mg  tablet for total of 30mg    hydrocortisone  (CORTEF ) 20 MG tablet  Yes  Sig: Take 1 tablet by mouth once daily Take with the 10mg  tablet for total of 30mg   midodrine  (PROAMATINE ) 10 MG tablet  Yes  Sig: Take 3 tablets by mouth 3 (three) times daily  mometasone -formoterol  (DULERA ) 200-5 mcg/actuation inhaler Unknown No  Sig: Inhale 2 inhalations into the lungs 2 (two) times daily  multivitamin tablet  Yes  Sig: Take 1 tablet by mouth once daily  nitroGLYcerin  (NITROSTAT ) 0.4 MG SL tablet  Yes  Sig: Place 0.4 mg under the tongue every 5 (five) minutes as needed for Chest pain May take up to 3 doses.  omeprazole (PRILOSEC) 20 MG DR capsule  Yes  Sig: Take 1 capsule by mouth once daily  ramelteon  (ROZEREM ) 8 mg tablet  Yes  Sig: Take 1 tablet by mouth at bedtime  timoloL  maleate (TIMOPTIC ) 0.5 % ophthalmic solution  Yes  Sig: Place 1 drop into both eyes every morning  traMADoL  (ULTRAM ) 50 mg tablet  Yes  Sig: Take 1 tablet (50 mg total) by mouth every 8 (eight) hours as needed for Pain  zinc  gluconate 50 mg tablet  Yes  Sig: Take 1 tablet by mouth once daily    Facility-Administered Medications: None    RELEVANT  HISTORY   Patient Active Problem List  Diagnosis   Abdominal pain   Prostatodynia syndrome   Abnormal EKG   Actinic keratosis   Acute chest pain   Acute cystitis with hematuria   Acute metabolic encephalopathy   Acute renal failure superimposed on chronic kidney disease ()   ESRD (end stage renal disease) (CMS/HHS-HCC)   Acute tubular necrosis ()   Adrenal insufficiency (HHS-HCC)   Anastomotic leak of intestine   Anemia in chronic kidney disease   Anemia   Macrocytic anemia   Asthma (HHS-HCC)   Sleep apnea, unspecified   AV fistula stenosis ()   Gram-positive bacteremia   Bacteremia due to Gram-positive bacteria   Basal cell carcinoma of dorsum of nose   Basal cell carcinoma of skin, unspecified   Benign prostatic hyperplasia   Bilateral leg edema   Cardiac  arrhythmia, unspecified   Chronic kidney disease,  stage 5 (CMS/HHS-HCC)   Stage 4 chronic kidney disease (CMS/HHS-HCC)   Hypertensive chronic kidney disease with stage 5 chronic kidney disease or end stage renal disease (CMS/HHS-HCC)   Coagulation defect, unspecified (HHS-HCC)   Conductive hearing loss   Deep vein thrombosis (DVT) of left upper extremity (CMS/HHS-HCC)   Deep venous thrombosis (CMS/HHS-HCC)   Dependence on renal dialysis ()   Depression   Compression of vein   Dialysis AV fistula malfunction   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 coli carrier   Essential (primary) hypertension   Primary hypertension   Fever   Glaucoma   Gram negative sepsis (CMS/HHS-HCC)   Hemodialysis catheter malfunction   History of anemia due to chronic kidney disease   History of Crohn's disease   History of sepsis   Hydronephrosis, bilateral, unspecified   Hypocalcemia   Hyponatremia   Cerebrovascular disease   Orthostatic hypotension   Other hypotension   Hypotension   Peripheral vascular disease, unspecified ()   Idiopathic neuropathy   H/O Spinal surgery   Ileostomy status (CMS/HHS-HCC)   Infection due to ESBL-producing Escherichia coli   Infection due to Port-A-Cath, initial encounter   Iron deficiency anemia   Irritant contact dermatitis associated with fecal stoma   Long term (current) use of anticoagulants   MRSA nasal colonization   Nephrostomy present (CMS/HHS-HCC)   Presence of colostomy (CMS/HHS-HCC)   Encounter for pre-operative cardiovascular clearance   Ileostomy care (CMS/HHS-HCC)   Nephrostomy status (CMS/HHS-HCC)    Other artificial openings of urinary tract status (CMS/HHS-HCC)   Obesity   Abscess   Obstructive uropathy   Other allergy, initial encounter   Otitis externa of right ear   Chronic ischemic heart disease   Chronic atrial fibrillation, unspecified (CMS/HHS-HCC)   Paroxysmal atrial fibrillation (CMS/HHS-HCC)   Perirectal abscess   Personal history of COVID-19   Personal history of other venous thrombosis and embolism   Personal history of transient ischemic attack (TIA), and cerebral infarction without residual deficits   Positive blood cultures   Preop testing   Screening for alcoholism   Pressure injury of skin of multiple topographic sites   Renal azotemia   Renal azotemia   SBO (small bowel obstruction) (CMS/HHS-HCC)   Short gut syndrome   Secondary hyperparathyroidism of renal origin (HHS-HCC)   Septic shock (CMS/HHS-HCC)   Cellulitis   SIRS (systemic inflammatory response syndrome) (CMS/HHS-HCC)   DOE (dyspnea on exertion)   Dyspnea on exertion   SOB (shortness of breath) on exertion   Spinal stenosis of cervical region   Spondylosis without myelopathy or radiculopathy, cervical region   Stenosis of right subclavian vein   Thoracic back pain   Thrombocytopenia ()   Ulcerative colitis (CMS/HHS-HCC)   Unspecified urethral stricture, male, unspecified site   Abscess of abdominal cavity (CMS/HHS-HCC)   Abscess of groin   Disorder of kidney and ureter, unspecified   Urinary tract infection, site not specified   Past Medical History:  Diagnosis Date   Anxiety    Arthritis    Asthma, unspecified asthma severity, unspecified whether complicated, unspecified whether persistent (HHS-HCC)    Chronic kidney disease    Colon polyp    Diverticulitis  H/O ulcerative colitis    History of Crohn's disease    Hx of long term use of blood thinners     Past Surgical History:  Procedure Laterality Date   COLON SURGERY      Family History  Problem Relation Name Age of Onset   Skin cancer Sister      Social History   Socioeconomic History   Marital status: Widowed  Tobacco Use   Smoking status: Former    Types: Cigarettes   Smokeless tobacco: Never  Vaping Use   Vaping status: Never Used  Substance and Sexual Activity   Alcohol use: Yes    Alcohol/week: 4.0 standard drinks of alcohol    Types: 4 Standard drinks or equivalent per week   Drug use: Never   Social Drivers of Corporate Investment Banker Strain: Low Risk  (06/21/2024)   Overall Financial Resource Strain (CARDIA)    Difficulty of Paying Living Expenses: Not hard at all  Food Insecurity: No Food Insecurity (06/21/2024)   Hunger Vital Sign    Worried About Running Out of Food in the Last Year: Never true    Ran Out of Food in the Last Year: Never true  Transportation Needs: No Transportation Needs (06/21/2024)   PRAPARE - Administrator, Civil Service (Medical): No    Lack of Transportation (Non-Medical): No  Physical Activity: Insufficiently Active (10/26/2023)   Received from [facility]   Exercise Vital Sign    On average, how many days per week do you engage in moderate to strenuous exercise (like a brisk walk)?: 1 day    On average, how many minutes do you engage in exercise at this level?: 10 min  Stress: Stress Concern Present (10/26/2023)   Received from [facility] - Occupational Stress Questionnaire    Feeling of Stress : To some extent  Social Connections: Patient Declined (04/18/2024)   Received from [facility]   Social Connection and Isolation Panel    In a typical week, how many times do you talk on the phone with family, friends, or neighbors?: Patient declined    How often do you get together with friends or relatives?: Patient declined    How often do you attend church or religious services?: Patient declined    Do you belong to any clubs  or organizations such as church groups, unions, fraternal or athletic groups, or school groups?: Patient declined    How often do you attend meetings of the clubs or organizations you belong to?: Patient declined    Are you married, widowed, divorced, separated, never married, or living with a partner?: Patient declined  Housing Stability: Low Risk  (06/21/2024)   Housing Stability Vital Sign    Unable to Pay for Housing in the Last Year: No    Number of Times Moved in the Last Year: 0    Homeless in the Last Year: No       06/09/2024 Hemodialysis Consent: Patient (or appropriate family member or authorized representative) has received information regarding the risks/benefits of Dialysis, and any alternatives to such treatments including the possible effects of not receiving Dialysis. This consent is valid for the duration of the hospital stay, but they have the option to cancel consent at any time.  TOMASZ R GAWECKI, MD [facility], [place] [phone] office

## 2024-06-24 NOTE — Procedures (Signed)
 Interventional Radiology Procedure Note  Procedure: perm cath insertion  Pre-Procedure Diagnosis: ESRD  Post-Procedure Diagnosis:  same  Anesthesia: local  EBL: minimal  Complications: None  Specimens: none  Findings: successful uncomplicated REJ perm cath insertion  Plan: catheter ready for use.

## 2024-06-24 NOTE — Progress Notes (Signed)
 [facility] Interventional Radiology  Department of Radiology [facility]  SUBJECTIVE:  Joshua Riley is a 88 y.o. year old male requested to undergo perm cath under moderate sedation.  Patient prior anesthesia or moderate sedation: Yes, with no complications. ASA CLASS: ASA 2 - Patient with mild systemic disease with no functional limitations Past Medical History:  Past Medical History:  Diagnosis Date   Anxiety    Arthritis    Asthma, unspecified asthma severity, unspecified whether complicated, unspecified whether persistent (HHS-HCC)    Chronic kidney disease    Colon polyp    Diverticulitis    H/O ulcerative colitis    History of Crohn's disease    Hx of long term use of blood thinners    Past Surgical History:  Past Surgical History:  Procedure Laterality Date   COLON SURGERY     Medications:  Current Facility-Administered Medications  Medication Dose Route Frequency Provider Last Rate Last Admin   acetaminophen  (TYLENOL ) tablet 975 mg  975 mg Oral TID Alfonza Norleen Gambler, MD   975 mg at 06/24/24 0830   albuterol  (PROVENTIL ) 2.5 mg /3 mL (0.083 %) nebulizer solution 2.5 mg  2.5 mg Nebulization QID PRN Cecille Damien Saunas, PA       [Held by provider] apixaban  (ELIQUIS ) tablet 2.5 mg  2.5 mg Oral Q12H [facility] Alfonza Norleen Gambler, MD   2.5 mg at 06/22/24 9183   atorvastatin  (LIPITOR) tablet 20 mg  20 mg Oral Daily Alfonza Norleen Gambler, MD   20 mg at 06/24/24 0831   dextrose  50% in water solution 12.5-25 g  12.5-25 g Intravenous As Directed Dronzek, Richerd Pica, MD       epoetin alfa-epbx (RETACRIT) 4,000 unit/mL injection 4,000 Units  4,000 Units Subcutaneous QMWF Moaje, Elynelle Joy, NP   4,000 Units at 06/22/24 1707   fludrocortisone  (FLORINEF ) tablet 0.1 mg  0.1 mg Oral Daily Alfonza Norleen Gambler, MD   0.1 mg at 06/24/24 0830   gabapentin  (NEURONTIN ) capsule 300 mg  300 mg Oral Daily Alfonza Norleen Gambler, MD   300 mg at 06/24/24 0830   glucagon  (GLUCAGEN) injection 1 mg  1 mg Intramuscular As Directed Dronzek, Richerd Pica, MD       heparin  (porcine) 1,000 unit/mL injection   Intracatheter As Directed Admin Mon[place], Caitlin Drennan, [place]       hydrocortisone  (CORTEF ) tablet 10 mg  10 mg Oral QHS Naeem, Halima, MD   10 mg at 06/23/24 2056   hydrocortisone  (CORTEF ) tablet 20 mg  20 mg Oral Daily Naeem, Halima, MD   20 mg at 06/24/24 0830   lidocaine  (XYLOCAINE ) 1 % injection 0.5 mL  0.5 mL Subcutaneous As Directed Dronzek, Richerd Pica, MD       melatonin tablet 6 mg  6 mg Oral QHS Vernola, Emily Keefer, PA   6 mg at 06/23/24 2056   midodrine  (PROAMATINE ) tablet 30 mg  30 mg Oral TID CC Vernola, Emily Keefer, PA   30 mg at 06/24/24 0830   ondansetron  (PF) (ZOFRAN ) injection 4 mg  4 mg Intravenous Q12H PRN Alfonza Norleen Gambler, MD   4 mg at 06/14/24 1511   oxyCODONE  (ROXICODONE ) immediate release tablet 5 mg  5 mg Oral Q4H PRN Alisa Juliene Laundry, MD   5 mg at 06/22/24 2148   Or   oxyCODONE  (ROXICODONE ) immediate release tablet 10 mg  10 mg Oral Q4H PRN Alisa Juliene Laundry, MD   10 mg at 06/23/24 1334   sevelamer  carbonate (RENVELA ) tablet  800 mg  800 mg Oral TID CC Moaje, Elynelle Joy, NP   800 mg at 06/24/24 0830   sodium zirconium cyclosilicate  (LOKELMA ) packet 10 g  10 g Oral Once Wachter, Adam Connell, MD       traZODone (DESYREL) tablet 50 mg  50 mg Oral QHS Alisa Juliene Laundry, MD   50 mg at 06/23/24 2056   Allergies:  Allergies  Allergen Reactions   Cephalosporins Rash    Tolerated cefepime  06/2024   Ciprofloxacin Itching and Rash   Baclofen Other (See Comments)    Altered mental status, after accidental overdose  Other Reaction(s): Other (See Comments)  Altered mental status, after accidental overdose   Social History:  Social History   Socioeconomic History   Marital status: Widowed  Tobacco Use   Smoking status: Former    Types: Cigarettes   Smokeless tobacco: Never  Vaping Use   Vaping  status: Never Used  Substance and Sexual Activity   Alcohol use: Yes    Alcohol/week: 4.0 standard drinks of alcohol    Types: 4 Standard drinks or equivalent per week   Drug use: Never   Social Drivers of Corporate Investment Banker Strain: Low Risk  (06/21/2024)   Overall Financial Resource Strain (CARDIA)    Difficulty of Paying Living Expenses: Not hard at all  Food Insecurity: No Food Insecurity (06/21/2024)   Hunger Vital Sign    Worried About Running Out of Food in the Last Year: Never true    Ran Out of Food in the Last Year: Never true  Transportation Needs: No Transportation Needs (06/21/2024)   PRAPARE - Administrator, Civil Service (Medical): No    Lack of Transportation (Non-Medical): No   Family History:  Family History  Problem Relation Name Age of Onset   Skin cancer Sister      PHYSICAL EXAM:  I have reviewed a recent chart H&P, examined the patient, and there is no interval change.  Vitals: BP 107/72   Pulse 67   Temp 36.6 C (97.8 F) (Oral)   Resp 14   Wt 75.8 kg (167 lb 1.7 oz)   SpO2 99%   BMI 25.41 kg/m  Body mass index is 25.41 kg/m. General appearance: alert, cooperative, pleasant, in no acute distress Heart: regular rate and rhythm without murmur Lungs: clear to auscultation, with good air exchange, without rales or wheeze Abdomen: soft, nondistended, nontender, normal bowel sounds  MALLAMPATI: II (hard and soft palate, upper portion of tonsils and uvula visible)  Review of Systems: A ROS was reviewed and within normal limits with the exceptions noted in the SUBJECTIVE/HPI above.  RADIOLOGY: IR central venous catheter placement Result Date: 06/17/2024 EXAM: Non-tunneled central venous catheter placement.  Temp cath hemodialysis catheter placement with sonographic and fluoroscopic guidance. 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  Leukocytosis, WBC 19,000 SEDATION / ANESTHESIA / MEDICATION: Level of sedation/anesthesia: Pain control Procedure performed with local anesthesia FLUOROSCOPY DOSE: Total fluoroscopy time: 0.0 minutes Dose area product: 114 mGy-cm2 Air kerma: 0.23 mGy COMPLICATIONS: None immediate PREPROCEDURE: Informed written consent was obtained after a discussion of the risks, benefits, and alternatives to the procedure.  A Time-Out was performed immediately prior to the procedure. TECHNIQUE AND FINDINGS: The patient was positioned supine on the procedure table. The procedure site was prepped and draped in the usual  sterile fashion. The target vein right common femoral vein was visualized by ultrasound and found to be patent. An image was saved and sent to PACS. After anesthetizing the overlying skin and subcutaneous tissues, the vein was accessed under real-time ultrasound guidance using a micropuncture kit. A guidewire was advanced into the central veins under fluoroscopic guidance. The tract was dilated.  The catheter was advanced over the guidewire. A radiograph confirmed appropriate location of the catheter tip at the IVC infrarenal location. The catheter aspirated and flushed properly. The catheter was anchored to the skin using non-absorbable suture.   The patient tolerated the procedure well.      Successful placement of a right common femoral vein 30   cm non-tunneled central venous catheter with sonographic and fluoroscopic guidance. The catheter is ready for use. I, MARK VICTORY GENTLE, MD, attest that I personally performed the procedure.   IR abscess drainage exchange Result Date: 06/16/2024 EXAM: IR abscess drainage exchange INDICATION: presacral drain removal Intra-abdominal abscess (CMS/HHS-HCC) [K65.1 (ICD-10-CM)] SEDATION / ANESTHESIA / MEDICATION: Level of sedation/anesthesia:  No sedation Sedation / Anesthesia / Medication Administered:  Totals unavailable because the procedure time range is not set FLUOROSCOPY DOSE: Total fluoroscopy time: 00 minutes Dose area product: 0 mGy-cm2 Air kerma: 0 mGy COMPLICATIONS: None immediate PREPROCEDURE: Informed written consent was obtained after a discussion of the risks, benefits, and alternatives to the procedure.  A Time-Out was performed immediately prior to the procedure. TECHNIQUE AND FINDINGS: The left gluteal approach sacral abscess drain was was identified.  The drain was prepped and draped in typical fashion.  The backing of the catheter was cut.  The catheter was removed in its entirety.  A sterile dressing was applied.  The patient Toller procedure well.     Removal of left gluteal approach sacral drain. I, JUSTIN SHERLEAN SABA, MD, attest that I personally performed the procedure.   Request for image library services Result Date: 06/13/2024 Please refer to the appropriate PACS to view images.   Request for image library services Result Date: 06/13/2024 Please refer to the appropriate PACS to view images.   IR nephrostomy tube exchange Result Date: 06/13/2024 EXAM: Nephrostomy tube exchange INDICATION: left pcn exchange Hydronephrosis, bilateral, unspecified [N13.30 (ICD-10-CM)] SEDATION / ANESTHESIA / MEDICATION: Level of sedation/anesthesia: Local Anesthesia Only Sedation / Anesthesia / Medication Administered:  Totals unavailable because the procedure time range is not set FLUOROSCOPY DOSE: Total fluoroscopy time: 0.5 minutes Dose area product: 1888 mGy-cm2 Air kerma: 5.45 mGy COMPLICATIONS: None immediate PREPROCEDURE: Informed written consent was obtained after a discussion of the risks, benefits, and alternatives to the procedure.  A Time-Out was performed immediately prior to the procedure. TECHNIQUE AND FINDINGS: The patient was positioned on the fluoroscopy table. The procedure site was prepped and draped in the  usual sterile fashion. Fluoroscopy demonstrated the location of the existing nephrostomy tube. After the catheter hub was cut, a guidewire was inserted through the tube and coiled in the renal pelvis under fluoroscopic guidance. The tube was removed and a new nephrostomy tube was advanced over the wire. After the pigtail was formed and locked, fluoroscopy was used to confirm appropriate positioning. The catheter was anchored to the skin with non-absorbable suture. The patient tolerated the procedure well.    Successful left 10 French percutaneous nephrostomy tube exchange. I, JUSTIN SHERLEAN SABA, MD, attest that I personally performed the procedure.   MRI foot right without contrast Result Date: 06/10/2024 EXAM: MRI FOOT RIGHT WITHOUT CONTRAST, MRI ANKLE RIGHT WITHOUT  CONTRAST INDICATION: 88 years old Male with right foot and ankle infection with multiple wounds. Systemic inflammatory response syndrome. Concern for osteomyelitis. TECHNIQUE: Multisequence, multiplanar MR imaging of the right foot and ankle without contrast. COMPARISON:   Right foot d x-ray dated 06/09/2024. FINDINGS: Right foot: No erosive osseous changes or marrow signal abnormalities to suggest active osteomyelitis. No evidence of acute fracture. No suspicious osseous lesions. Mild osteoarthritis changes. There is subchondral cyst formation in the proximal 1st metatarsal. Joint spaces are maintained without evidence of dislocation. No significant joint effusion. There is diffuse atrophy of the intrinsic foot muscles with diffuse intramuscular signal abnormality. The visualized right forefoot tendons are unremarkable. Diffuse subcutaneous edema. No loculated fluid collections. Right ankle: Mild cortical irregularity and marrow signal abnormality involving the medial malleolus of the distal right tibia, suspicious for mild osteitis/osteomyelitis changes. There is deformity of the overlying skin. No other osseous changes to suggest  osteomyelitis about the right ankle or hindfoot. Mild marrow signal abnormality about the subtalar joints is favored reactive. No evidence of acute fracture. No suspicious osseous lesions. Normal alignment of the right ankle and hindfoot. A small ankle joint effusion is present. No acute abnormality of the right ankle ligaments. There is diffuse soft tissue edema about the right ankle. No loculated subcutaneous fluid collections are identified. The extensor flexor tendons about the right ankle are normal in signal intensity without evidence of tendinosis or tear. There is fluid within the tendon sheaths of the posterior tibial tendon and flexor hallucis tendon, likely reactive. The Achilles tendon is normal. IMPRESSION: 1. Mild cortical irregularity and underlying marrow signal abnormality involving the medial malleolus of the distal right tibia, suspicious for mild osteitis/osteomyelitis changes. No other evidence of osteomyelitis involving the right foot or ankle. 2. Diffuse subcutaneous edema. No loculated fluid collections identified to suggest abscess. Electronically Signed by:  Oneil Haver, MD, [facility] Radiology Electronically Signed on:  06/10/2024 8:44 PM  MRI ankle right without contrast Result Date: 06/10/2024 EXAM: MRI FOOT RIGHT WITHOUT CONTRAST, MRI ANKLE RIGHT WITHOUT CONTRAST INDICATION: 88 years old Male with right foot and ankle infection with multiple wounds. Systemic inflammatory response syndrome. Concern for osteomyelitis. TECHNIQUE: Multisequence, multiplanar MR imaging of the right foot and ankle without contrast. COMPARISON:   Right foot d x-ray dated 06/09/2024. FINDINGS: Right foot: No erosive osseous changes or marrow signal abnormalities to suggest active osteomyelitis. No evidence of acute fracture. No suspicious osseous lesions. Mild osteoarthritis changes. There is subchondral cyst formation in the proximal 1st metatarsal. Joint spaces are maintained without evidence of dislocation. No  significant joint effusion. There is diffuse atrophy of the intrinsic foot muscles with diffuse intramuscular signal abnormality. The visualized right forefoot tendons are unremarkable. Diffuse subcutaneous edema. No loculated fluid collections. Right ankle: Mild cortical irregularity and marrow signal abnormality involving the medial malleolus of the distal right tibia, suspicious for mild osteitis/osteomyelitis changes. There is deformity of the overlying skin. No other osseous changes to suggest osteomyelitis about the right ankle or hindfoot. Mild marrow signal abnormality about the subtalar joints is favored reactive. No evidence of acute fracture. No suspicious osseous lesions. Normal alignment of the right ankle and hindfoot. A small ankle joint effusion is present. No acute abnormality of the right ankle ligaments. There is diffuse soft tissue edema about the right ankle. No loculated subcutaneous fluid collections are identified. The extensor flexor tendons about the right ankle are normal in signal intensity without evidence of tendinosis or tear. There is fluid within the tendon sheaths  of the posterior tibial tendon and flexor hallucis tendon, likely reactive. The Achilles tendon is normal. IMPRESSION: 1. Mild cortical irregularity and underlying marrow signal abnormality involving the medial malleolus of the distal right tibia, suspicious for mild osteitis/osteomyelitis changes. No other evidence of osteomyelitis involving the right foot or ankle. 2. Diffuse subcutaneous edema. No loculated fluid collections identified to suggest abscess. Electronically Signed by:  Oneil Haver, MD, [facility] Radiology Electronically Signed on:  06/10/2024 8:44 PM  CT peritoneal abscess drain Result Date: 06/10/2024 Procedure:  Pelvic drain placement Indication:Presacral abscess Comparison: 06/09/2024 Sedation: Per ICU. Technique: A preprocedure assessment was performed and all questions were answered. The procedure, risks,  benefits, and alternatives were discussed with the patient or health care proxy, who gave informed consent and agreed to proceed. A timeout was held. The patient was positioned prone  on the CT scan table and an initial scan was taken. Dose reduction was obtained with Automatic Exposure Control (AEC) or, if AEC could not be utilized, by manual adjustment of the mA and/or kV according to patient size. Findings: There is a presacral abscess measuring approximately 7.6 x 6.2 cm. A suitable location was identified and marked. The patient's left gluteal region  was prepped and draped using aseptic technique. 1% lidocaine  was used to anesthetize the entry point. Using serial CT imaging, an 18-gauge Hawkins needle was used to enter the abscess. A 0.035 guidewire was inserted through the needle into the collection and confirmed by CT imaging. The needle was removed and a 10 french Cook pigtail catheter was inserted over the wire into the collection and the wire and stylette were removed. 7 cc of  brown fluid was aspirated and a sample was sent to the lab for testing as requested. The pigtail was created. A final CT confirmed the drain to be in good position. The drain was sutured into place and a dry sterile dressing was placed. The patient tolerated presacral abscess drain presacral abscess same per ICU none brown fluid sent for culture. Technically successful placement of pigtail catheter within the vein presacral abscess. The procedure well. Impression: Successful CT guided abscess drainage. A 10 French catheter was placed. Brown fluid was obtained, and samples were sent to the lab as requested. Electronically Signed by:  Eva Saba, MD, [facility] Radiology Electronically Signed on:  06/10/2024 5:18 PM  Echo complete Result Date: 06/10/2024               [facility]                         WAINO MOUNSEY                               [facility]                                [phone]  DOB: [birth year]  Age: 29                  ECHO-DOPPLER REPORT                             Date: 06/10/2024                                                                                 Male                                                                                      Inpatient                                                                       LOCATION: CCU                                                                                  MD1: ANANDA CHOWDHURY             STUDY: ECHO COMPLETE                             SOUND QLTY: Moderate                      ECHO: Yes                                           STRAIN: No                           COLOR: Yes                                               3D: No                         DOPPLER: Yes  BP: 87 / 48                  RV BIOPSY: No                                                HR: 79 BPM                    CONTRAST: No                                            Height: 68 in                       MEDIUM: N/A                                           Weight: 165 lbs                    MACHINE: DRH - EpiQ-2                                     BSA: 1.9                   ------------------------------------------------------------------------------------------     History: Sepsis and Septic Shock      Reason: LV function  Indication: I95.89- Other hypotension.                                                          A41.9- Sepsis, unspecified organism (CMS/HHS-HCC).                                  R65.21- Severe sepsis with septic shock (CMS/HHS-HCC).                              CONCLUSION ------------------------------------------------------------------------------- NORMAL LEFT VENTRICULAR SYSTOLIC FUNCTION WITH MILD LVH ESTIMATED EF: >55% INDETERMINATE  DIASTOLIC FUNCTION NORMAL RIGHT VENTRICULAR SYSTOLIC FUNCTION VALVULAR REGURGITATION: MILD AR, TRIVIAL MR, TRIVIAL PR, MILD TR                         ESTIMATED RVSP: 35 mmHg (Normal) VALVULAR STENOSIS: MILD AS, No MS, No PS, No TS                                          ------------------------------------------------------------------------------------------ ARRHYTHMIA THROUGHOUT THE STUDY  NORMAL LV SYSTOLIC FUNCTION                                                              PROBABLY, BICUSPID AORTIC VALVE WITH MILD AS/ MILD AI                                    NORMAL RV SIZE AND FUNCTION                                                                                                                                                       NO VEGETATIONS NOTED.                                                                     NO PRIOR ECHO FOR COMPARISON                                                                                          ECHOCARDIOGRAPHIC DESCRIPTIONS ----------------------------------------------------------- AORTIC ROOT         Asc Ao Size: Normal                               Dissection: INDETERMINATE FOR DISSECTION                                                    Ao Arch Size: Not Seen                           Desc Ao Size: Not Seen                    AORTIC VALVE            Leaflets:  BICUSPID                                Mobility: Fully Mobile                    Morphology: MILDLY THICKENED                                                                          AR: MILD AR                                       AS: MILD AS                            AV Mass: No Masses                   LEFT VENTRICLE                Size: Normal                                                                                   LVH: MILD LVH                             Contraction: Normal                               Closest EF: >55%                                                      LV Mass: No Masses                         Dias. FxClass: INDETERMINATE                                                            WALL MOTION                      Basal             Mid               Apical              Anterior Septum:  Normal            Normal            Normal                Anterior Wall: Normal            Normal            Normal                 Lateral Wall: Normal            Normal            Normal               Posterior Wall: Normal            Normal                                  Inferior Wall: Normal            Normal            Normal              Inferior Septum: Normal            Normal                            MITRAL VALVE            Leaflets: Normal                                  Mobility: Fully Mobile                    Morphology: ANNULAR CALC                                                                               MR: TRIVIAL MR                                    MS: No MS                            MV masses: No Masses                   LEFT ATRIUM                Size: Normal                                                                             LA masses: No Masses  MAIN PA                Size: Not Seen                    PULMONIC VALVE            Leaflets: UNKNOWN                                 Mobility: Fully Mobile                    Morphology: Normal                                                           PR: TRIVIAL PR                                    PS: No PS                            PV masses: No Masses                   RIGHT VENTRICLE                Size: Normal                                 Free Wall: Normal                         Contraction: Normal                                                    RV masses: No Masses                   TRICUSPID VALVE            Leaflets: Normal                                   Mobility: Fully Mobile                    Morphology: Normal                                       TR: MILD TR                                       TS: No TS                            TV masses: No Masses  RIGHT ATRIUM                Size: SMALL                                 RA masses: No Masses                   PERICARDIUM               Fluid: No Effusion        INFERIOR VENA CAVA                Size: DILATED                                Max Diam: 2.4 cm                                  Min Diam: 1.7 cm                      Percent Change: 30 %                                                                       Resp.Collapse: ABNORMAL RESPIRATORY COLLAPSE                                        RESTING ECHOCARDIOGRAPHIC MEASUREMENTS --------------------------------------------------- AORTA Measurements            Values    Units     Normal Range                              Aorta Sin: 3.7       cm        [2.8 - 4.0]                               Asc.Aorta: 3.8       cm        [2.2 - 3.8]                          Asc. Aorta BSA: 2         cm/m2     [1.1 - 1.9]                  LEFT VENTRICLE                  LVIDd: 3.4       cm        [4.2 - 5.8]                                   LVIDs: 1.7       cm        [  2.5- 4]                                  LVIDd/BSA: 1.8       cm/m2                                                     SWT: 1.2       cm        [0.6 - 1]                                       PWT: 0.9       cm        [0.6 - 1]                    DIASTOLIC FUNCTION          MV Pk. E Vel.: 134       cm/s                                                    MV DT: 155       msec                                   LEFT ATRIUM                LA Diam: 2.9       cm        [3 - 4]                      RIGHT VENTRICLE                RV Base: 3.1       cm        [2.5 - 4.1]                                  RV Mid: 2         cm        [1.9 - 3.5]                  RIGHT ATRIUM                 RA Area: 10.7      cm2       [ <= 20]                                  RA Volume: 23        mL  RAVi: 12        mL/m2     [11 - 39]                    INFERIOR VENA CAVA                Max.IVC: 2.4       cm        [ <= 2.1]                                   Min.IVC: 1.7       cm        [ <= 1.7]                            Percent Change: 30        %                                      Pressures, Gradients, and DOPPLER ECHO --------------------------------------------------- Mitral Valve          MV Pk. E Vel.: 134       cm/s                                         MV Inflow E Vel.: 134       cm/s                                   Tricuspid Regurgitation Values            TR Pk. Vel.: 2.2       m/s                                               RA Pressure: 15        mmHg                                                     RVSP: 35        mmHg      Peak                                Perform By: Franky Ohara, RDCS                                                          Res. Person: Franky Ohara, RDCS  Electronically signed by Lynwood Donnice Crouch, M.D. on:06/10/2024 3:58:11 PM with status of Final The images are stored in the [facility] system, please contact the clinical provider for images related to this study.                                                                      CT lower extremity left with contrast Result Date: 06/10/2024 CT OF THE LEFT LOWER EXTREMITY WITH CONTRAST Indication: Osteomyelitis suspected, femur, xray done, I95.89 Other hypotension Comparison: None Technique: Non-contrast axial images of the left femur were acquired. Sagittal and coronal reformatted images were created from the data, and this examination was interpreted using these multiplanar reconstructions. Contrast: 100 mL Isovue -300 Findings: Hardware: None Alignment: Anatomic Bones: No fracture. No evidence of osteoarthritis  Joint spaces: Preserved. No hip or knee effusion. Soft tissues: Diffuse subcutaneous edema within the visualized lower leg. No deep subcutaneous fluid collection to suggest an abscess. No subcutaneous free air Diffuse muscle atrophy. Vasculature: Moderate scattered atherosclerotic plaque within the arterial vasculature with multifocal areas of mild stenosis. 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 Additionally, adjacent to the graft are well-circumscribed fluid collections along the anterolateral thigh measuring 1.4 x 1.0 cm (4:122) and anterior thigh measuring 1.1 x 0.8 cm (4:134). Additional abandoned AV fistula, which is nonopacified. Other: Please refer to separately dictated same-day CT abdomen pelvis for further findings. Impression: 1.  Diffuse subcutaneous edema within the visualized lower leg. No deep abscess. 2.  No acute osseous abnormality. No evidence of osteomyelitis. 3.  Patent left thigh AV fistula. However high-grade focal stenosis involving the mid graft as above secondary to circumferential mural thrombus. Electronically Signed by:  Anil Nagavalli, DO, [facility] Electronically Signed on:  06/10/2024 12:27 AM  CT lower extremity right with contrast Result Date: 06/10/2024 CT OF THE RIGHT LOWER EXTREMITY WITH CONTRAST Indication: Osteomyelitis suspected, femur, xray done, I95.89 Other hypotension Comparison: None Technique: Non-contrast axial images of the right femur were acquired. Sagittal and coronal reformatted images were created from the data, and this examination was interpreted using these multiplanar reconstructions. Contrast: 100 mL Isovue -370 Findings: Hardware: None Alignment: Anatomic Bones: No fracture. No evidence of osteomyelitis. Joint spaces: Preserved. No hip or knee joint effusion. Soft tissues: Diffuse subcutaneous edema predominantly about the distal  lateral leg. No deep fluid collection to suggest an abscess. No free air. Diffuse muscle atrophy. Diffuse atherosclerotic calcifications within the vasculature with multifocal areas of mild-to-moderate stenosis. Other: Please refer to separately dictated same-day CT of the abdomen and pelvis for further findings. Impression: 1.  Diffuse subcutaneous edema predominantly along the distal lateral leg. No deep fluid collections to suggest an abscess. 2.  No acute osseous abnormality. Electronically Signed by:  Anil Nagavalli, DO, [facility] Electronically Signed on:  06/10/2024 12:15 AM  X-ray foot right 3 plus views Result Date: 06/09/2024 EXAM:  XR FOOT LEFT 3 PLUS VIEWS, XR FOOT RIGHT 3 PLUS VIEWS INDICATION: Wound infection. COMPARISON:  none TECHNIQUE: Frontal, oblique and lateral views of the feet were obtained. FINDINGS: Right foot: Normal alignment and joint spaces. No acute fracture. No focal cortical erosive changes to suggest osteomyelitis. Degenerative  changes of the ankle and midfoot. Diffuse osteopenia. Achilles enthesophyte. Vascular calcifications. Left foot: Normal alignment and joint spaces. No acute fracture. No focal cortical erosive changes to suggest osteomyelitis. Mild degenerative changes throughout the foot. Diffuse osteopenia. Achilles enthesophyte. Vascular calcifications. IMPRESSION: No acute osseous abnormality of the bilateral foot. No radiographic evidence of osteomyelitis. Electronically Signed by:  Anil Nagavalli, DO, [facility] Rad[facility] Electronically Signed on:  06/09/2024 10:29 PM  X-ray foot left 3 plus views Result Date: 06/09/2024 EXAM:  XR FOOT LEFT 3 PLUS VIEWS, XR FOOT RIGHT 3 PLUS VIEWS INDICATION: Wound infection. COMPARISON:  none TECHNIQUE: Frontal, oblique and lateral views of the feet were obtained. FINDINGS: Right foot: Normal alignment and joint spaces. No acute fracture. No focal cortical erosive changes to suggest osteomyelitis. Degenerative changes of the ankle  and midfoot. Diffuse osteopenia. Achilles enthesophyte. Vascular calcifications. Left foot: Normal alignment and joint spaces. No acute fracture. No focal cortical erosive changes to suggest osteomyelitis. Mild degenerative changes throughout the foot. Diffuse osteopenia. Achilles enthesophyte. Vascular calcifications. IMPRESSION: No acute osseous abnormality of the bilateral foot. No radiographic evidence of osteomyelitis. Electronically Signed by:  Anil Nagavalli, DO, [facility] Electronically Signed on:  06/09/2024 10:29 PM  X-ray tibia fibula left 2 views Result Date: 06/09/2024 EXAM: XR TIBIA FIBULA LEFT 2 VIEWS, XR TIBIA FIBULA RIGHT 2 VIEWS INDICATION: Wound infection COMPARISON:  none TECHNIQUE: AP and lateral views of the bilateral tibia/fibula were performed. FINDINGS: Left tibia/fibula: Normal alignment and joint spaces. No acute fracture. Normal bone mineralization. No focal cortical erosive changes to suggest osteomyelitis. Vascular calcifications. No knee joint effusion. Right tibia/fibula: Normal alignment and joint spaces. No acute fracture. Normal bone mineralization. No focal cortical erosive changes to suggest osteomyelitis. Vascular calcifications. No knee joint effusion. IMPRESSION: No acute osseous abnormality of the bilateral leg. No radiographic evidence of osteomyelitis. Electronically Signed by:  Anil Nagavalli, DO, [facility] Radiology Electronically Signed on:  06/09/2024 10:26 PM  X-ray tibia fibula right 2 views Result Date: 06/09/2024 EXAM: XR TIBIA FIBULA LEFT 2 VIEWS, XR TIBIA FIBULA RIGHT 2 VIEWS INDICATION: Wound infection COMPARISON:  none TECHNIQUE: AP and lateral views of the bilateral tibia/fibula were performed. FINDINGS: Left tibia/fibula: Normal alignment and joint spaces. No acute fracture. Normal bone mineralization. No focal cortical erosive changes to suggest osteomyelitis. Vascular calcifications. No knee joint effusion. Right tibia/fibula: Normal alignment and  joint spaces. No acute fracture. Normal bone mineralization. No focal cortical erosive changes to suggest osteomyelitis. Vascular calcifications. No knee joint effusion. IMPRESSION: No acute osseous abnormality of the bilateral leg. No radiographic evidence of osteomyelitis. Electronically Signed by:  Shirlyn Saver, DO, [place] Electronically Signed on:  06/09/2024 10:26 PM  CT abdomen pelvis with contrast Result Date: 06/09/2024 EXAMINATION: CT ABDOMEN PELVIS WITH CONTRAST INDICATION: Abdominal abscess/infection suspected, septic w/ nephrostomy tube, I95.89 Other hypotension COMPARISON: Reference CT pelvis 05/05/2024 TECHNIQUE: CT imaging was performed of the abdomen and pelvis following the uncomplicated administration of intravenous contrast (Isovue -300). Contrast was used due to the indications for the examination; if IV contrast material had not been administered, the likelihood of detecting abnormalities relevant to the patients condition would have been substantially decreased.  Coronal and sagittal reformatted images were generated and reviewed to improve anatomic localization and optimize lesion detection. FINDINGS: - Lower Thorax: No suspicious pulmonary abnormalities. Small right pleural effusion with associated atelectasis. - Liver: Normal in morphology and enhancement.  No suspicious hepatic masses are identified.  The portal veins are patent. Trace perihepatic free fluid. - Biliary and Gallbladder:  No intrahepatic or extrahepatic bile duct dilatation. Large gallstone measuring 2.4 cm. - Spleen: Normal in appearance.  - Pancreas: Normal in appearance. - Adrenal Glands: Normal in appearance. - Kidneys: Atrophic. Left percutaneous nephrostomy tube in appropriate position. Bilateral renal cysts. No hydronephrosis. - Abdominal and Pelvic Vasculature: No abdominal aortic aneurysm. Moderate atherosclerotic plaque. Left lower extremity femoral venous and arterial grafts are noted and patent.  Additional left femoral arterial graft is not opacified. Similar small fluid collection measuring 2.5 x 2.4 cm adjacent to the venotomy. Prominent left inferior epigastric vessel is noted. - Gastrointestinal Tract: Postoperative changes from a colectomy with an ostomy in the right lower quadrant. - Peritoneum/Mesentery/Retroperitoneum: Interval increase in size of a thick rimmed presacral fluid collection measuring 6.6 x 5.0 x 10.6 cm (3:68). Trace locules of air within the collection (6:48). No free intraperitoneal air. - Lymph Nodes: No retroperitoneal, mesenteric, pelvic, or inguinal lymphadenopathy.  - Bladder: Decompressed. - Pelvic Organs: Penile prosthesis. Prostamegaly - Body Wall: Postoperative changes as above. - Musculoskeletal:  No aggressive appearing osseous lesions. 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 IMPRESSION: 1.  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 2.  Percutaneous left nephrostomy tube in appropriate position. No hydronephrosis. 3.  Small right pleural effusion with associated atelectasis. Electronically Signed by:  Anil Nagavalli, DO, [facility] Radiology Electronically Signed on:  06/09/2024 8:47 PM  X-ray chest single view portable Result Date: 06/09/2024 Procedure: XR CHEST SINGLE VIEW PORTABLE Indication: Hypotension, sepsis, evaluate pulmonary source Comparison: None Findings: AP view of the chest. Patient is rotated. Normal cardiomediastinal silhouette. Mildly prominent interstitial opacities bilaterally. No large pleural effusion. No pneumothorax. No acute osseous abnormality. IMPRESSION: Mildly prominent interstitial opacities bilaterally, which may represent mild interstitial pulmonary edema versus atelectasis. Electronically Signed by:  Anil Nagavalli, DO, [facility] Radiology Electronically Signed on:  06/09/2024 7:17 PM  Request for image library services Result Date: 06/08/2024 Please refer to the appropriate PACS to view images.     LABS:  No results for input(s): APTT, INR, PTT in the last 168 hours. Recent Labs  Lab 06/21/24 0542  NA 137  K 5.0  CL 105  CO2 21  BUN 49*  CREATININE 4.6*  GLUCOSE 162*  CALCIUM  8.7  PHOS 3.7   Labs in chart were reviewed.  ASSESSMENT/PLAN:  Pulmonary, cardiac, abdominal, airway, and mental status appropriate for this procedure. The patient is NPO. The risks, benefits and alternatives have all been discussed. All questions were answered and informed consent has been obtained. Moderate sedation is appropriate for this patient at this time.   DNR: No. Suspended for procedure? Not applicable  JOSEFA DOOR, MD 06/24/2024

## 2024-07-04 ENCOUNTER — Inpatient Hospital Stay: Admission: RE | Admit: 2024-07-04 | Source: Ambulatory Visit

## 2024-07-04 ENCOUNTER — Inpatient Hospital Stay: Admission: RE | Admit: 2024-07-04

## 2024-07-07 DEATH — deceased

## 2024-07-08 ENCOUNTER — Encounter: Payer: Self-pay | Admitting: Family Medicine
